# Patient Record
Sex: Female | Born: 1945 | Race: White | Hispanic: No | Smoking: Never smoker
Health system: Southern US, Community
[De-identification: ages and names within clinical notes are randomized; demographics above are authoritative.]

## PROBLEM LIST (undated history)

## (undated) DIAGNOSIS — R5382 Chronic fatigue, unspecified: Secondary | ICD-10-CM

## (undated) DIAGNOSIS — E119 Type 2 diabetes mellitus without complications: Secondary | ICD-10-CM

## (undated) DIAGNOSIS — J189 Pneumonia, unspecified organism: Secondary | ICD-10-CM

## (undated) DIAGNOSIS — M797 Fibromyalgia: Secondary | ICD-10-CM

## (undated) DIAGNOSIS — K219 Gastro-esophageal reflux disease without esophagitis: Secondary | ICD-10-CM

## (undated) DIAGNOSIS — R112 Nausea with vomiting, unspecified: Secondary | ICD-10-CM

## (undated) DIAGNOSIS — E785 Hyperlipidemia, unspecified: Secondary | ICD-10-CM

## (undated) DIAGNOSIS — Z87442 Personal history of urinary calculi: Secondary | ICD-10-CM

## (undated) DIAGNOSIS — K146 Glossodynia: Secondary | ICD-10-CM

## (undated) DIAGNOSIS — F5105 Insomnia due to other mental disorder: Secondary | ICD-10-CM

## (undated) DIAGNOSIS — R197 Diarrhea, unspecified: Secondary | ICD-10-CM

## (undated) DIAGNOSIS — K5900 Constipation, unspecified: Secondary | ICD-10-CM

## (undated) DIAGNOSIS — Z9889 Other specified postprocedural states: Secondary | ICD-10-CM

## (undated) DIAGNOSIS — M519 Unspecified thoracic, thoracolumbar and lumbosacral intervertebral disc disorder: Secondary | ICD-10-CM

## (undated) DIAGNOSIS — R1900 Intra-abdominal and pelvic swelling, mass and lump, unspecified site: Secondary | ICD-10-CM

## (undated) DIAGNOSIS — C569 Malignant neoplasm of unspecified ovary: Secondary | ICD-10-CM

## (undated) DIAGNOSIS — R0602 Shortness of breath: Secondary | ICD-10-CM

## (undated) DIAGNOSIS — F418 Other specified anxiety disorders: Secondary | ICD-10-CM

## (undated) DIAGNOSIS — I10 Essential (primary) hypertension: Secondary | ICD-10-CM

## (undated) DIAGNOSIS — B379 Candidiasis, unspecified: Secondary | ICD-10-CM

## (undated) DIAGNOSIS — G4730 Sleep apnea, unspecified: Secondary | ICD-10-CM

## (undated) HISTORY — PX: HERNIA REPAIR: SHX51

## (undated) HISTORY — DX: Unspecified thoracic, thoracolumbar and lumbosacral intervertebral disc disorder: M51.9

## (undated) HISTORY — DX: Sleep apnea, unspecified: G47.30

## (undated) HISTORY — DX: Chronic fatigue, unspecified: R53.82

## (undated) HISTORY — PX: ABDOMINAL HYSTERECTOMY: SHX81

## (undated) HISTORY — DX: Intra-abdominal and pelvic swelling, mass and lump, unspecified site: R19.00

## (undated) HISTORY — DX: Diarrhea, unspecified: R19.7

## (undated) HISTORY — DX: Malignant neoplasm of unspecified ovary: C56.9

## (undated) HISTORY — PX: TRIGGER FINGER RELEASE: SHX641

## (undated) HISTORY — DX: Constipation, unspecified: K59.00

## (undated) HISTORY — DX: Hyperlipidemia, unspecified: E78.5

## (undated) HISTORY — PX: CHOLECYSTECTOMY: SHX55

## (undated) HISTORY — DX: Glossodynia: K14.6

## (undated) HISTORY — DX: Fibromyalgia: M79.7

## (undated) HISTORY — DX: Type 2 diabetes mellitus without complications: E11.9

## (undated) HISTORY — PX: APPENDECTOMY: SHX54

## (undated) HISTORY — PX: OTHER SURGICAL HISTORY: SHX169

---

## 1998-06-17 ENCOUNTER — Ambulatory Visit (HOSPITAL_COMMUNITY): Admission: RE | Admit: 1998-06-17 | Discharge: 1998-06-17 | Payer: Self-pay | Admitting: Obstetrics and Gynecology

## 1998-06-17 ENCOUNTER — Encounter: Payer: Self-pay | Admitting: Obstetrics and Gynecology

## 1999-09-03 ENCOUNTER — Ambulatory Visit (HOSPITAL_COMMUNITY): Admission: RE | Admit: 1999-09-03 | Discharge: 1999-09-03 | Payer: Self-pay | Admitting: Family Medicine

## 1999-09-05 ENCOUNTER — Ambulatory Visit (HOSPITAL_COMMUNITY): Admission: RE | Admit: 1999-09-05 | Discharge: 1999-09-05 | Payer: Self-pay | Admitting: Family Medicine

## 1999-09-05 ENCOUNTER — Encounter: Payer: Self-pay | Admitting: Family Medicine

## 2002-05-25 ENCOUNTER — Encounter: Payer: Self-pay | Admitting: Family Medicine

## 2002-05-25 ENCOUNTER — Ambulatory Visit (HOSPITAL_COMMUNITY): Admission: RE | Admit: 2002-05-25 | Discharge: 2002-05-25 | Payer: Self-pay | Admitting: Family Medicine

## 2002-08-06 ENCOUNTER — Encounter: Admission: RE | Admit: 2002-08-06 | Discharge: 2002-08-06 | Payer: Self-pay | Admitting: Family Medicine

## 2002-08-06 ENCOUNTER — Encounter: Payer: Self-pay | Admitting: Family Medicine

## 2003-07-10 ENCOUNTER — Encounter: Admission: RE | Admit: 2003-07-10 | Discharge: 2003-10-08 | Payer: Self-pay | Admitting: Family Medicine

## 2004-08-10 HISTORY — PX: PARTIAL HYSTERECTOMY: SHX80

## 2004-09-05 ENCOUNTER — Ambulatory Visit (HOSPITAL_BASED_OUTPATIENT_CLINIC_OR_DEPARTMENT_OTHER): Admission: RE | Admit: 2004-09-05 | Discharge: 2004-09-05 | Payer: Self-pay | Admitting: Orthopedic Surgery

## 2009-03-29 ENCOUNTER — Emergency Department (HOSPITAL_COMMUNITY): Admission: EM | Admit: 2009-03-29 | Discharge: 2009-03-29 | Payer: Self-pay | Admitting: Emergency Medicine

## 2009-05-16 ENCOUNTER — Encounter: Admission: RE | Admit: 2009-05-16 | Discharge: 2009-05-16 | Payer: Self-pay | Admitting: Family Medicine

## 2010-02-24 ENCOUNTER — Emergency Department (HOSPITAL_COMMUNITY): Admission: EM | Admit: 2010-02-24 | Discharge: 2010-02-24 | Payer: Self-pay | Admitting: Emergency Medicine

## 2010-10-25 LAB — POCT CARDIAC MARKERS
CKMB, poc: <1 ng/mL — ABNORMAL LOW (ref 1.0–8.0)
Myoglobin, poc: 39.3 ng/mL (ref 12–200)
Troponin i, poc: <0.05 ng/mL (ref 0.00–0.09)

## 2010-12-26 NOTE — Op Note (Signed)
NAME:  Kristin Webb, Kristin Webb               ACCOUNT NO.:  [id number]   MEDICAL RECORD NO.:  [id number]          PATIENT TYPE:  AMB   LOCATION:  DSC                          FACILITY:  [facility]   PHYSICIAN:  Katy Fitch. Sypher Montez Hageman., M.D.DATE OF BIRTH:  29-Apr-1946   DATE OF PROCEDURE:  09/05/2004  DATE OF DISCHARGE:                                 OPERATIVE REPORT   PREOPERATIVE DIAGNOSIS:  Chronic stenosing tenosynovitis, right long, ring  and small fingers, and left thumb.   POSTOPERATIVE DIAGNOSIS:  Chronic stenosing tenosynovitis, right long, ring  and small fingers, and left thumb.   OPERATION:  1.  Release of right long finger A1 pulley.  2.  Release of the right ring finger A1 pulley.  3.  Release of right small finger A1 pulley.  4.  Release of left thumb A1 pulley.   OPERATING SURGEON:  Katy Fitch. Sypher, M.D.   ASSISTANT:  Jonni Sanger, P.A.   ANESTHESIA:  General by LMA   SUPERVISING ANESTHESIOLOGIST:  Janetta Hora. Gelene Mink, M.D.   INDICATIONS:  Kristin Webb is a 65 year old woman referred by Dr. Chales Salmon.  Thacker for evaluation and management of painful stenosing tenosynovitis.   Clinical examination revealed multiple sites of stenosing tenosynovitis in  both hands.   She was initially treated with steroid injection with only transient relief.   Due to failed respond to nonoperative measures, she is brought to operating  this time to relieve locked trigger fingers involving her right long, right  ring and right small fingers as well as her left thumb.   Preoperatively, we sent her for panel of lab studies, looking for  inflammation and signs of an inflammatory arthritis. Her lab studies were  unrevealing.   After informed consent, during which questions were invited and answered,  she is brought the operating room at this time.   PROCEDURE:  Kristin Webb was brought to the operating room and placed in  supine position upon the operating table.   Following the  induction of general anesthesia by LMA, the right and left  arms were prepped with Betadine soaping solution and sterilely draped. A  pneumatic tourniquet was applied to the proximal brachium.   On exsanguination of the limb with an Esmarch bandage, an arterial  tourniquet on the proximal brachium was inflated to 230 mmHg on the right  side.   Procedure commenced with an incision directly over the A1 pulley of the long  finger. Subcutaneous tissue were carefully divided, taking care to gently  retract the neurovascular structures. The A1 pulley was isolated and split  with a scalpel and scissors. The tendon was delivered and found be swollen.  The wound was then repaired with a mattress suture of 5-0 nylon.   A second incision was fashioned in the distal palmar crease between the A1  pulleys of the ring and small fingers.   With a gentle dissection, the palmar fascia was spread, the A1 pulleys of  the ring and small fingers identified and subsequently split with scalpel  and scissors. The tendons were delivered and found to be swollen;  thereafter, the triggering phenomenon was relieved.   This wound was then repaired with mattress suture of 5-0 nylon.   A compressive dressing was applied to the right hand with Xeroflo, sterile  gauze and an Ace wrap. The tourniquet released with immediate capillary  refill to all fingers and the thumb.   Attention was then directed to the left hand.   The left hand was exsanguinated with a Esmarch bandage and arterial  tourniquet inflated on the proximal forearm to 240 mmHg. Procedure commenced  with a short transverse incision directly over the palpably thickened A1  pulley of thumb. The subcutaneous tissues were carefully divided, revealing  the pulley. The radial proper digital nerve was gently retracted. The pulley  was split in its midline and released with scissors.   This relieved the compression on the flexor pollicis longus and  allowing  recovery of full motion of the left thumb IP joint.   This wound was repaired with a mattress suture of 5-0 nylon, followed by  dressing with Xeroflo, sterile gauze and Ace wrap.   Kristin Webb was then awakened from anesthesia and transferred to recovery  room with stable signs..   There were no apparent complications.      RVS/MEDQ  D:  09/05/2004  T:  09/05/2004  Job:  [id number]

## 2012-01-14 ENCOUNTER — Other Ambulatory Visit: Payer: Self-pay | Admitting: Family Medicine

## 2012-01-14 DIAGNOSIS — Z1231 Encounter for screening mammogram for malignant neoplasm of breast: Secondary | ICD-10-CM

## 2012-02-16 ENCOUNTER — Ambulatory Visit
Admission: RE | Admit: 2012-02-16 | Discharge: 2012-02-16 | Disposition: A | Discharge status: Home / Self Care | Payer: PRIVATE HEALTH INSURANCE | Source: Ambulatory Visit | Attending: Family Medicine | Admitting: Family Medicine

## 2012-02-16 DIAGNOSIS — Z1231 Encounter for screening mammogram for malignant neoplasm of breast: Secondary | ICD-10-CM

## 2012-08-10 DIAGNOSIS — C569 Malignant neoplasm of unspecified ovary: Secondary | ICD-10-CM

## 2012-08-10 HISTORY — DX: Malignant neoplasm of unspecified ovary: C56.9

## 2012-10-26 ENCOUNTER — Other Ambulatory Visit: Payer: Self-pay | Admitting: Family Medicine

## 2012-10-26 DIAGNOSIS — R143 Flatulence: Secondary | ICD-10-CM

## 2012-10-26 DIAGNOSIS — R142 Eructation: Secondary | ICD-10-CM

## 2012-11-01 ENCOUNTER — Ambulatory Visit
Admission: RE | Admit: 2012-11-01 | Discharge: 2012-11-01 | Disposition: A | Discharge status: Home / Self Care | Payer: Medicare Other | Source: Ambulatory Visit | Attending: Family Medicine | Admitting: Family Medicine

## 2012-11-01 DIAGNOSIS — R143 Flatulence: Secondary | ICD-10-CM

## 2012-11-01 MED ORDER — IOHEXOL 300 MG/ML  SOLN
100.0000 mL | Freq: Once | INTRAMUSCULAR | Status: AC | PRN
  Administered 2012-11-01: 100 mL via INTRAVENOUS

## 2012-11-02 ENCOUNTER — Encounter: Payer: Self-pay | Admitting: Gynecologic Oncology

## 2012-11-02 ENCOUNTER — Ambulatory Visit: Payer: Medicare Other | Attending: Gynecologic Oncology | Admitting: Gynecologic Oncology

## 2012-11-02 VITALS — BP 160/90 | HR 88 | Temp 98.4°F | Resp 24 | Ht 60.0 in | Wt 188.3 lb

## 2012-11-02 DIAGNOSIS — D3000 Benign neoplasm of unspecified kidney: Secondary | ICD-10-CM | POA: Insufficient documentation

## 2012-11-02 DIAGNOSIS — R109 Unspecified abdominal pain: Secondary | ICD-10-CM | POA: Insufficient documentation

## 2012-11-02 DIAGNOSIS — R1909 Other intra-abdominal and pelvic swelling, mass and lump: Secondary | ICD-10-CM | POA: Insufficient documentation

## 2012-11-02 DIAGNOSIS — K5900 Constipation, unspecified: Secondary | ICD-10-CM | POA: Insufficient documentation

## 2012-11-02 DIAGNOSIS — R1900 Intra-abdominal and pelvic swelling, mass and lump, unspecified site: Secondary | ICD-10-CM

## 2012-11-02 DIAGNOSIS — L539 Erythematous condition, unspecified: Secondary | ICD-10-CM | POA: Insufficient documentation

## 2012-11-02 DIAGNOSIS — R197 Diarrhea, unspecified: Secondary | ICD-10-CM | POA: Insufficient documentation

## 2012-11-02 DIAGNOSIS — N200 Calculus of kidney: Secondary | ICD-10-CM | POA: Insufficient documentation

## 2012-11-02 DIAGNOSIS — N83209 Unspecified ovarian cyst, unspecified side: Secondary | ICD-10-CM

## 2012-11-02 NOTE — Patient Instructions (Signed)
Ovarian Tumors The ovaries are small organs that produce eggs in women. They lie on each side of the uterus. Tumors are solid growths on the ovary, not like ovarian cysts that are filled with fluid. They can be cancerous or noncancerous. All solid tumors should be looked at to make sure they are not cancer tumors.  CAUSES  There are no known causes for developing a solid tumor on the ovary. However, there are several risk factors for developing cancerous tumors on the ovary, such as:  Aging.  [place] or [place] European descent.  Personal or family history of ovarian, colon and breast cancer.  Women with BRCA 1 or BRCA 2 genes are at high risk for getting ovarian cancer.  The use of fertility medications to get pregnant may increase the risk for getting ovarian cancer.  Late menopause (after age 63).  Women who become pregnant for the first time at 45 or older. Having these risk factors does not mean you will get ovarian cancer. However, you should know about them and report any that you have to your caregiver. Also, a woman with none of these risk factors can still get ovarian cancer. SYMPTOMS  In many cases there are no symptoms. Noncancerous tumors usually have no symptoms but cancerous tumors may have symptoms that are minor and resemble other health problems. The following are symptoms that may be important to diagnosing cancer of the ovary:  Unexplained weight loss.  Increase abdominal size.  Pain in the belly (abdomen).  Pain or pressure in the back and pelvis.  Tiredness.  Abnormal vaginal bleeding.  Loss of appetite.  Frequent urination or pressure on your bladder.  Indigestion, increase gas and bloating.  Painful sexual intercourse. DIAGNOSIS   During an exam, an abnormal mass may be found in the pelvis. It is important to have a rectovaginal examination to help find pelvic masses, especially in women over 63 years old.  An ultrasound may be done.  X-ray,  CT scan or MRI imaging.  Blood tests.  A Pap test does not help in diagnosing tumors or cancer of the ovary. New screening tests are always being studied to detect early ovarian cancer. TREATMENT   All solid tumors of the ovary should be evaluated, usually with surgery, to make sure they are not cancerous.  The tumor will be studied in the lab under the microscope to see if it is cancer.  Noncancerous tumors can be removed surgically with or without removing the ovary.  Cancerous tumors usually are removed with the ovary and sometimes both ovaries are removed with the Fallopian tubes, uterus and surrounding lymph nodes to see if the cancer has spread.  Cancerous tumors may also be treated along with the surgery with radiation, chemotherapy or both.  The surgeon should be a gynecology oncologist (cancer specialist in gynecology cancer surgery) and the chemotherapist and radiation therapist should be experienced specialists in their field. HOME CARE INSTRUCTIONS   Inform your caregiver if you or anyone in your family has had cancer.  Follow your caregiver's advice and recommendations regarding medications and follow up care.  Get a yearly physical and gynecology exams. This includes a rectovaginal exam if you are 53 years old or older. SEEK MEDICAL CARE IF:   You have any of the above symptoms that have not gone away after a week of treatment.  You are losing weight for no reason.  You feel generally ill. Document Released: 05/05/2008 Document Revised: 10/19/2011 Document Reviewed: 05/05/2008 [employer]  Patient Information 2013 [employer].  

## 2012-11-02 NOTE — Progress Notes (Signed)
Consult Note: Gyn-Onc  Carlethia Mesquita 67 y.o. female  CC:  Chief Complaint  Patient presents with  . Pelvic Mass    New patient    HPI: Patient is seen today in consultation at the request of Dr. Corliss Blacker.  Patient is a 67 year old gravida 2 para 2 who at about the third week of January started noticing some increasing swelling in her abdomen. Initially the pain was in swelling is somewhat intermittent and it would come and go and she felt was related to starting calcium supplementation as she her that that could cause symptoms. In February the pain became much more constant. In addition, it has been fairly normal for her to have 5-6 bowel movements a day and diarrhea which has been chronic after her cholecystectomy. She usually has a bowel movement about 15 minutes after eating. This changed as well. She started having some increasing constipation and her stools are more formed. She started to feel that she was not emptying her stools.   She had a CT scan of the abdomen and pelvis on March 25. It revealed no significant biliary dilation. There were no suspicious liver lesions. The spleen, adrenal glands and pancreas appeared normal. There is 1.2 cm calculus in the interpolar region of the left kidney. 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 She has a large midabdominal mass measuring 17 x 22.7 cm x 20 cm cephalad. It is well circumscribed without calcifications. There is irregular thickened septations and areas of solid nodularity consistent with malignancy. There is no bowel obstruction. Appears to be separate from the appendix. Probable a normal left ovarian tissue seen. The uterus is surgically absent. There is some soft tissue stranding at the base of the mesentery superior to the mass. There is also some omental nodularity. It is for this that she is referred to see [place] today. She did have a CA 125 drawn the results which are not available to me at this time. She does not remember  when her last mammogram was. She had a normal screening colonoscopy in July 2013.  Review of Systems: Much of the review of systems is as above. She does have slight shortness of breath but is no worse and has been. She denies any chest pain. She can climb a flight of stairs without difficulty. She's noticed no issues with her eating. She does not have any early satiety. She's noticed no decrease in her bloating or discomfort related to either eating, not eating, or having a bowel movement. She denies any nausea vomiting.  Remainder of 10 point review of systems is negative.  Current Meds:  Outpatient Encounter Prescriptions as of 11/02/2012  Medication Sig Dispense Refill  . Armodafinil (NUVIGIL) 250 MG tablet Take 250 mg by mouth daily.      Marland Kitchen BIOTIN PO Take by mouth daily.      . Calcium Citrate-Vitamin D (CALCIUM CITRATE + D PO) Take by mouth daily.      . Cholecalciferol (VITAMIN D3) 5000 UNITS CAPS Take by mouth daily.      . Eszopiclone (ESZOPICLONE) 3 MG TABS Take 3 mg by mouth at bedtime. Take immediately before bedtime      . fish oil-omega-3 fatty acids 1000 MG capsule Take 1 g by mouth 2 (two) times daily.      Marland Kitchen FLUoxetine (PROZAC) 10 MG tablet Take 30 mg by mouth daily.      Marland Kitchen glucosamine-chondroitin 500-400 MG tablet Take 1 tablet by mouth 2 (two)  times daily.      Marland Kitchen MELATONIN PO Take by mouth at bedtime.      . Rosuvastatin Calcium (CRESTOR PO) Take by mouth at bedtime.       No facility-administered encounter medications on file as of 11/02/2012.    Allergy:  Allergies  Allergen Reactions  . Bee Venom Hives    Difficulty breathing, carries an EPI-pen  . Cortisone     Injected cortisone, "sick to my stomach, throwing up, hand swelling, and pain."    Social Hx:   History   Social History  . Marital Status: Single    Spouse Name: N/A    Number of Children: N/A  . Years of Education: N/A   Occupational History  . Not on file.   Social History Main Topics  .  Smoking status: Never Smoker   . Smokeless tobacco: Not on file  . Alcohol Use: No  . Drug Use: No  . Sexually Active: Not on file   Other Topics Concern  . Not on file   Social History Narrative  . No narrative on file    Past Surgical Hx:  Past Surgical History  Procedure Laterality Date  . Cholecystectomy      early 11s  . Abdominal hysterectomy      early 1990s  . Trigger finger release      Past Medical Hx:  Past Medical History  Diagnosis Date  . Pelvic mass in female   . Constipation   . Diarrhea     in the past after gallbladder removal  . Sleep apnea     CPAP  . Hyperlipidemia   . Burning mouth syndrome   . Chronic fatigue   . Fibromyalgia   . Lumbar disc disease     Family Hx:  Family History  Problem Relation Age of Onset  . Lung cancer Mother   . Lung cancer Father     Vitals:  Blood pressure 160/90, pulse 88, temperature 98.4 F (36.9 C), resp. rate 24, height 5' (1.524 m), weight 188 lb 4.8 oz (85.412 kg).  Physical Exam:  Well-nourished well-developed female in no acute distress.  Neck: Supple, no lymphadenopathy, no thyromegaly.  Lungs: Clear to auscultation bilaterally.  Cardiovascular: Regular rate and rhythm.  Abdomen: Massively enlarged with a pelvic mass that extends almost to the xiphoid. It is nontender. There is no appreciable fluid wave. Exam is somewhat limited by habitus.  Groins: No lymphadenopathy.  Extremities: No edema.  Pelvic: External genitalia notable for erythema consistent with Candida. Bimanual examination reveals a mass to be high out of the pelvis. On pelvic examination there is no nodularity. Rectal confirms.  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 I do not have her CA 125 available. However based on size and symptoms she needs to undergo surgical removal of this. She is tentatively scheduled for surgery on April 1. We'll proceed with exploratory  laparotomy and removal of this mass. The mass be sent for frozen section. We'll remove the contralateral ovary as well. Should the mass returned as benign we'll evaluate the omentum to ensure the other nodule areas are not of any significance. Should it be a malignancy she'll undergo omentectomy and appropriate staging.  Risks of surgery including but not limited to bleeding, infection, thromboembolic disease were discussed with the patient. She understands that she'll have SCD hose on during the surgery receive Lovenox injections while in house. Should this be a malignancy she'll undergo Lovenox  injections when she goes home. She was instructed to bring her CPAP machine to the hospital on the day of surgery.  Should go to the preoperative screening process for the hospital.  Her questions were elicited in answer to her satisfaction. She has to negotiate the surgical date with her family due to family conflicts.  She is currently posted for 11/08/12 and she may choose to change to the following week.  Cleda Mccreedy A., MD 11/02/2012, 12:39 PM

## 2012-11-03 ENCOUNTER — Encounter (HOSPITAL_COMMUNITY): Payer: Self-pay | Admitting: Pharmacy Technician

## 2012-11-03 NOTE — Patient Instructions (Signed)
Kristin Webb  11/03/2012   Your procedure is scheduled on:  11/08/12   Report to [facility] at           AM.  Call this number if you have problems the morning of surgery: [phone]   Remember:   Do not eat food or drink liquids after midnight.   Take these medicines the morning of surgery with A SIP OF WATER:    Do not wear jewelry, make-up or nail polish.  Do not wear lotions, powders, or perfumes.   Do not shave 48 hours prior to surgery.   Do not bring valuables to the hospital.  Contacts, dentures or bridgework may not be worn into surgery.  Leave suitcase in the car. After surgery it may be brought to your room.  For patients admitted to the hospital, checkout time is 11:00 AM the day of  discharge.      SEE CHG INSTRUCTION SHEET    Please read over the following fact sheets that you were given: MRSA Information, coughing and deep breathing exercises, leg exercises, Blood transfusion Fact sheet , Incentive Spirometry Fact Sheet                Failure to comply with these instructions may result in cancellation of your surgery.                Patient Signature ____________________________              Nurse Signature _____________________________

## 2012-11-04 ENCOUNTER — Encounter (HOSPITAL_COMMUNITY): Payer: Self-pay

## 2012-11-04 ENCOUNTER — Ambulatory Visit (HOSPITAL_COMMUNITY)
Admission: RE | Admit: 2012-11-04 | Discharge: 2012-11-04 | Disposition: A | Discharge status: Home / Self Care | Payer: Medicare Other | Source: Ambulatory Visit | Attending: Gynecologic Oncology | Admitting: Gynecologic Oncology

## 2012-11-04 ENCOUNTER — Encounter (HOSPITAL_COMMUNITY)
Admission: RE | Admit: 2012-11-04 | Discharge: 2012-11-04 | Disposition: A | Discharge status: Home / Self Care | Payer: Medicare Other | Source: Ambulatory Visit | Attending: Gynecologic Oncology | Admitting: Gynecologic Oncology

## 2012-11-04 DIAGNOSIS — Z01812 Encounter for preprocedural laboratory examination: Secondary | ICD-10-CM | POA: Insufficient documentation

## 2012-11-04 DIAGNOSIS — Z01810 Encounter for preprocedural cardiovascular examination: Secondary | ICD-10-CM | POA: Insufficient documentation

## 2012-11-04 DIAGNOSIS — R9431 Abnormal electrocardiogram [ECG] [EKG]: Secondary | ICD-10-CM | POA: Insufficient documentation

## 2012-11-04 DIAGNOSIS — Z01818 Encounter for other preprocedural examination: Secondary | ICD-10-CM | POA: Insufficient documentation

## 2012-11-04 HISTORY — DX: Essential (primary) hypertension: I10

## 2012-11-04 HISTORY — DX: Candidiasis, unspecified: B37.9

## 2012-11-04 HISTORY — DX: Gastro-esophageal reflux disease without esophagitis: K21.9

## 2012-11-04 HISTORY — DX: Other specified postprocedural states: Z98.890

## 2012-11-04 HISTORY — DX: Nausea with vomiting, unspecified: R11.2

## 2012-11-04 HISTORY — DX: Pneumonia, unspecified organism: J18.9

## 2012-11-04 HISTORY — DX: Shortness of breath: R06.02

## 2012-11-04 LAB — CBC WITH DIFFERENTIAL/PLATELET
Basophils Absolute: 0 10*3/uL (ref 0.0–0.1)
Basophils Relative: 1 % (ref 0–1)
Eosinophils Absolute: 0.1 10*3/uL (ref 0.0–0.7)
Eosinophils Relative: 2 % (ref 0–5)
HCT: 43.8 % (ref 36.0–46.0)
Hemoglobin: 14.3 g/dL (ref 12.0–15.0)
Lymphocytes Relative: 30 % (ref 12–46)
Lymphs Abs: 1.6 10*3/uL (ref 0.7–4.0)
MCH: 28.9 pg (ref 26.0–34.0)
MCHC: 32.6 g/dL (ref 30.0–36.0)
MCV: 88.7 fL (ref 78.0–100.0)
Monocytes Absolute: 0.6 10*3/uL (ref 0.1–1.0)
Monocytes Relative: 12 % (ref 3–12)
Neutro Abs: 3 10*3/uL (ref 1.7–7.7)
Neutrophils Relative %: 56 % (ref 43–77)
Platelets: 174 10*3/uL (ref 150–400)
RBC: 4.94 MIL/uL (ref 3.87–5.11)
RDW: 13.3 % (ref 11.5–15.5)
WBC: 5.3 10*3/uL (ref 4.0–10.5)

## 2012-11-04 LAB — SURGICAL PCR SCREEN
MRSA, PCR: NEGATIVE
Staphylococcus aureus: NEGATIVE

## 2012-11-04 LAB — COMPREHENSIVE METABOLIC PANEL
ALT: 17 U/L (ref 0–35)
AST: 16 U/L (ref 0–37)
Albumin: 3.7 g/dL (ref 3.5–5.2)
Alkaline Phosphatase: 87 U/L (ref 39–117)
BUN: 15 mg/dL (ref 6–23)
CO2: 28 mEq/L (ref 19–32)
Calcium: 9.5 mg/dL (ref 8.4–10.5)
Chloride: 103 mEq/L (ref 96–112)
Creatinine, Ser: 0.6 mg/dL (ref 0.50–1.10)
GFR calc Af Amer: >90 mL/min (ref >90)
GFR calc non Af Amer: >90 mL/min (ref >90)
Glucose, Bld: 91 mg/dL (ref 70–99)
Potassium: 4 mEq/L (ref 3.5–5.1)
Sodium: 140 mEq/L (ref 135–145)
Total Bilirubin: 0.2 mg/dL — ABNORMAL LOW (ref 0.3–1.2)
Total Protein: 7.2 g/dL (ref 6.0–8.3)

## 2012-11-04 LAB — ABO/RH: ABO/RH(D): O POS

## 2012-11-04 NOTE — Progress Notes (Signed)
Requested ( left message with Medical Records0 and requested last office visit note from approximately 8 months ago from Dr Porfirio Mylar Dohmeier.

## 2012-11-04 NOTE — Progress Notes (Signed)
Patient did not sign operative consent at time of preop appointment.  Patient stated that risks, goals, etc had not been discussed with her.  FYI.  Consent on front of chart.

## 2012-11-04 NOTE — Progress Notes (Signed)
Called patient at home to determine the powder used for yeast.  Patient stated Nystatin powder unsure of strength.  Called [facility] at [facility] and pharmacy stated it is Nystatin powder 100,000units /GM as needed.  Placed in home medications.

## 2012-11-04 NOTE — Progress Notes (Signed)
Attempted to request  last office visit note from Dr Richardean Chimera [phone] to be placed on chart.  Patient stated she was last seen approximately 8 months ago.

## 2012-11-07 NOTE — Progress Notes (Signed)
Last office visit note with Dr Richardean Chimera on chart from 9/13.

## 2012-11-07 NOTE — Progress Notes (Signed)
Requested from office of Dr Richardean Chimera ( left messsage) last office visit note to be faxed.

## 2012-11-08 ENCOUNTER — Inpatient Hospital Stay (HOSPITAL_COMMUNITY)
Admission: RE | Admit: 2012-11-08 | Discharge: 2012-11-12 | DRG: 737 | Disposition: A | Discharge status: Home / Self Care | Payer: Medicare Other | Source: Ambulatory Visit | Attending: Obstetrics & Gynecology | Admitting: Obstetrics & Gynecology

## 2012-11-08 ENCOUNTER — Encounter (HOSPITAL_COMMUNITY)
Admission: RE | Disposition: A | Discharge status: Home / Self Care | Payer: Self-pay | Source: Ambulatory Visit | Attending: Obstetrics & Gynecology

## 2012-11-08 ENCOUNTER — Encounter (HOSPITAL_COMMUNITY): Payer: Self-pay | Admitting: Anesthesiology

## 2012-11-08 ENCOUNTER — Inpatient Hospital Stay (HOSPITAL_COMMUNITY): Payer: Medicare Other | Admitting: Anesthesiology

## 2012-11-08 ENCOUNTER — Encounter (HOSPITAL_COMMUNITY): Payer: Self-pay | Admitting: *Deleted

## 2012-11-08 DIAGNOSIS — Z6836 Body mass index (BMI) 36.0-36.9, adult: Secondary | ICD-10-CM

## 2012-11-08 DIAGNOSIS — I10 Essential (primary) hypertension: Secondary | ICD-10-CM | POA: Diagnosis present

## 2012-11-08 DIAGNOSIS — B373 Candidiasis of vulva and vagina: Secondary | ICD-10-CM | POA: Diagnosis present

## 2012-11-08 DIAGNOSIS — B3731 Acute candidiasis of vulva and vagina: Secondary | ICD-10-CM | POA: Diagnosis present

## 2012-11-08 DIAGNOSIS — E785 Hyperlipidemia, unspecified: Secondary | ICD-10-CM | POA: Diagnosis present

## 2012-11-08 DIAGNOSIS — Z79899 Other long term (current) drug therapy: Secondary | ICD-10-CM

## 2012-11-08 DIAGNOSIS — K668 Other specified disorders of peritoneum: Secondary | ICD-10-CM | POA: Diagnosis present

## 2012-11-08 DIAGNOSIS — IMO0001 Reserved for inherently not codable concepts without codable children: Secondary | ICD-10-CM | POA: Diagnosis present

## 2012-11-08 DIAGNOSIS — E669 Obesity, unspecified: Secondary | ICD-10-CM | POA: Diagnosis present

## 2012-11-08 DIAGNOSIS — C5700 Malignant neoplasm of unspecified fallopian tube: Secondary | ICD-10-CM | POA: Diagnosis present

## 2012-11-08 DIAGNOSIS — C481 Malignant neoplasm of specified parts of peritoneum: Secondary | ICD-10-CM | POA: Diagnosis present

## 2012-11-08 DIAGNOSIS — Z9089 Acquired absence of other organs: Secondary | ICD-10-CM

## 2012-11-08 DIAGNOSIS — D3910 Neoplasm of uncertain behavior of unspecified ovary: Principal | ICD-10-CM | POA: Diagnosis present

## 2012-11-08 DIAGNOSIS — G4730 Sleep apnea, unspecified: Secondary | ICD-10-CM | POA: Diagnosis present

## 2012-11-08 DIAGNOSIS — Z90710 Acquired absence of both cervix and uterus: Secondary | ICD-10-CM

## 2012-11-08 DIAGNOSIS — C569 Malignant neoplasm of unspecified ovary: Secondary | ICD-10-CM

## 2012-11-08 DIAGNOSIS — N83209 Unspecified ovarian cyst, unspecified side: Secondary | ICD-10-CM

## 2012-11-08 HISTORY — PX: LAPAROTOMY: SHX154

## 2012-11-08 LAB — TYPE AND SCREEN
ABO/RH(D): O POS
Antibody Screen: NEGATIVE

## 2012-11-08 SURGERY — LAPAROTOMY, EXPLORATORY
Anesthesia: General | Site: Pelvis | Laterality: Bilateral | Wound class: Clean Contaminated

## 2012-11-08 MED ORDER — LACTATED RINGERS IV SOLN
INTRAVENOUS | Status: DC
  Administered 2012-11-08 (×4): via INTRAVENOUS

## 2012-11-08 MED ORDER — MEPERIDINE HCL 50 MG/ML IJ SOLN: 6.2500 mg | INTRAMUSCULAR | Status: DC | PRN

## 2012-11-08 MED ORDER — METOCLOPRAMIDE HCL 5 MG/ML IJ SOLN
INTRAMUSCULAR | Status: DC | PRN
  Administered 2012-11-08: 5 mg via INTRAVENOUS

## 2012-11-08 MED ORDER — PROPOFOL 10 MG/ML IV EMUL
INTRAVENOUS | Status: DC | PRN
  Administered 2012-11-08: 200 mg via INTRAVENOUS
  Administered 2012-11-08: 50 mg via INTRAVENOUS

## 2012-11-08 MED ORDER — NALOXONE HCL 0.4 MG/ML IJ SOLN: 0.4000 mg | INTRAMUSCULAR | Status: DC | PRN

## 2012-11-08 MED ORDER — KCL IN DEXTROSE-NACL 20-5-0.45 MEQ/L-%-% IV SOLN
INTRAVENOUS | Status: DC
  Administered 2012-11-08 – 2012-11-09 (×2): via INTRAVENOUS
  Filled 2012-11-08 (×3): qty 1000

## 2012-11-08 MED ORDER — MIDAZOLAM HCL 5 MG/5ML IJ SOLN
INTRAMUSCULAR | Status: DC | PRN
  Administered 2012-11-08 (×2): 1 mg via INTRAVENOUS

## 2012-11-08 MED ORDER — SODIUM CHLORIDE 0.9 % IJ SOLN: 9.0000 mL | INTRAMUSCULAR | Status: DC | PRN

## 2012-11-08 MED ORDER — HYDROMORPHONE HCL PF 1 MG/ML IJ SOLN
INTRAMUSCULAR | Status: DC | PRN
  Administered 2012-11-08: 1 mg via INTRAVENOUS

## 2012-11-08 MED ORDER — LIDOCAINE HCL (CARDIAC) 20 MG/ML IV SOLN
INTRAVENOUS | Status: DC | PRN
  Administered 2012-11-08: 100 mg via INTRAVENOUS

## 2012-11-08 MED ORDER — KETOROLAC TROMETHAMINE 30 MG/ML IJ SOLN
15.0000 mg | Freq: Four times a day (QID) | INTRAMUSCULAR | Status: DC
  Filled 2012-11-08 (×4): qty 1

## 2012-11-08 MED ORDER — HYDRALAZINE HCL 20 MG/ML IJ SOLN
INTRAMUSCULAR | Status: DC | PRN
  Administered 2012-11-08 (×2): 5 mg via INTRAVENOUS

## 2012-11-08 MED ORDER — ATORVASTATIN CALCIUM 10 MG PO TABS
10.0000 mg | ORAL_TABLET | Freq: Every day | ORAL | Status: DC
  Filled 2012-11-08 (×5): qty 1

## 2012-11-08 MED ORDER — GLYCOPYRROLATE 0.2 MG/ML IJ SOLN
INTRAMUSCULAR | Status: DC | PRN
  Administered 2012-11-08: .8 mg via INTRAVENOUS

## 2012-11-08 MED ORDER — SODIUM CHLORIDE 0.9 % IJ SOLN
INTRAMUSCULAR | Status: DC | PRN
  Administered 2012-11-08: 16:00:00

## 2012-11-08 MED ORDER — LABETALOL HCL 5 MG/ML IV SOLN
INTRAVENOUS | Status: DC | PRN
  Administered 2012-11-08 (×4): 5 mg via INTRAVENOUS

## 2012-11-08 MED ORDER — SODIUM CHLORIDE 0.9 % IV SOLN
INTRAVENOUS | Status: AC
  Filled 2012-11-08: qty 1

## 2012-11-08 MED ORDER — SUFENTANIL CITRATE 50 MCG/ML IV SOLN
INTRAVENOUS | Status: DC | PRN
  Administered 2012-11-08 (×3): 10 ug via INTRAVENOUS
  Administered 2012-11-08: 5 ug via INTRAVENOUS
  Administered 2012-11-08: 20 ug via INTRAVENOUS
  Administered 2012-11-08: 15 ug via INTRAVENOUS
  Administered 2012-11-08: 10 ug via INTRAVENOUS
  Administered 2012-11-08: 20 ug via INTRAVENOUS

## 2012-11-08 MED ORDER — ENOXAPARIN SODIUM 40 MG/0.4ML ~~LOC~~ SOLN
40.0000 mg | SUBCUTANEOUS | Status: AC
  Administered 2012-11-08: 40 mg via SUBCUTANEOUS
  Filled 2012-11-08: qty 0.4

## 2012-11-08 MED ORDER — OXYCODONE-ACETAMINOPHEN 5-325 MG PO TABS
1.0000 | ORAL_TABLET | ORAL | Status: DC | PRN
  Administered 2012-11-09: 1 via ORAL
  Administered 2012-11-09 (×2): 2 via ORAL
  Administered 2012-11-09: 1 via ORAL
  Administered 2012-11-10 – 2012-11-11 (×5): 2 via ORAL
  Filled 2012-11-08 (×2): qty 2
  Filled 2012-11-08: qty 1
  Filled 2012-11-08 (×2): qty 2
  Filled 2012-11-08: qty 1
  Filled 2012-11-08: qty 2
  Filled 2012-11-08: qty 1
  Filled 2012-11-08 (×2): qty 2

## 2012-11-08 MED ORDER — OXYCODONE HCL 5 MG PO TABS: 5.0000 mg | ORAL_TABLET | Freq: Once | ORAL | Status: DC | PRN

## 2012-11-08 MED ORDER — KETOROLAC TROMETHAMINE 30 MG/ML IJ SOLN
15.0000 mg | Freq: Four times a day (QID) | INTRAMUSCULAR | Status: DC
  Administered 2012-11-08 – 2012-11-10 (×7): 15 mg via INTRAVENOUS
  Filled 2012-11-08 (×14): qty 1

## 2012-11-08 MED ORDER — SODIUM CHLORIDE 0.9 % IV SOLN
1.0000 g | INTRAVENOUS | Status: DC | PRN
  Administered 2012-11-08: 1 g via INTRAVENOUS

## 2012-11-08 MED ORDER — DIPHENHYDRAMINE HCL 50 MG/ML IJ SOLN: 12.5000 mg | Freq: Four times a day (QID) | INTRAMUSCULAR | Status: DC | PRN

## 2012-11-08 MED ORDER — SCOPOLAMINE 1 MG/3DAYS TD PT72
1.0000 | MEDICATED_PATCH | TRANSDERMAL | Status: DC
  Administered 2012-11-08: 1 via TRANSDERMAL
  Administered 2012-11-08: 1.5 mg via TRANSDERMAL

## 2012-11-08 MED ORDER — CEFAZOLIN SODIUM-DEXTROSE 2-3 GM-% IV SOLR
2.0000 g | INTRAVENOUS | Status: AC
  Administered 2012-11-08: 2 g via INTRAVENOUS

## 2012-11-08 MED ORDER — MAGNESIUM HYDROXIDE 400 MG/5ML PO SUSP
30.0000 mL | Freq: Three times a day (TID) | ORAL | Status: AC
  Administered 2012-11-08 – 2012-11-09 (×3): 30 mL via ORAL
  Filled 2012-11-08 (×3): qty 30

## 2012-11-08 MED ORDER — OXYCODONE HCL 5 MG/5ML PO SOLN
5.0000 mg | Freq: Once | ORAL | Status: DC | PRN
  Filled 2012-11-08: qty 5

## 2012-11-08 MED ORDER — ONDANSETRON HCL 4 MG PO TABS
4.0000 mg | ORAL_TABLET | Freq: Four times a day (QID) | ORAL | Status: DC | PRN
  Administered 2012-11-10 – 2012-11-11 (×3): 4 mg via ORAL
  Filled 2012-11-08 (×3): qty 1

## 2012-11-08 MED ORDER — NEOSTIGMINE METHYLSULFATE 1 MG/ML IJ SOLN
INTRAMUSCULAR | Status: DC | PRN
  Administered 2012-11-08: 5 mg via INTRAVENOUS

## 2012-11-08 MED ORDER — ARMODAFINIL 250 MG PO TABS
250.0000 mg | ORAL_TABLET | Freq: Every morning | ORAL | Status: DC
  Administered 2012-11-10 – 2012-11-11 (×2): 250 mg via ORAL

## 2012-11-08 MED ORDER — ENOXAPARIN SODIUM 40 MG/0.4ML ~~LOC~~ SOLN
40.0000 mg | SUBCUTANEOUS | Status: DC
  Administered 2012-11-09 – 2012-11-12 (×4): 40 mg via SUBCUTANEOUS
  Filled 2012-11-08 (×5): qty 0.4

## 2012-11-08 MED ORDER — 0.9 % SODIUM CHLORIDE (POUR BTL) OPTIME
TOPICAL | Status: DC | PRN
  Administered 2012-11-08 (×2): 1000 mL

## 2012-11-08 MED ORDER — HYDROMORPHONE 0.3 MG/ML IV SOLN
INTRAVENOUS | Status: DC
  Administered 2012-11-08: 17:00:00 via INTRAVENOUS
  Administered 2012-11-08: 1.79 mg via INTRAVENOUS
  Administered 2012-11-09: 0.2 mg via INTRAVENOUS
  Administered 2012-11-09: 1.59 mg via INTRAVENOUS
  Administered 2012-11-09: 0.7999 mg via INTRAVENOUS

## 2012-11-08 MED ORDER — SUCCINYLCHOLINE CHLORIDE 20 MG/ML IJ SOLN
INTRAMUSCULAR | Status: DC | PRN
  Administered 2012-11-08: 100 mg via INTRAVENOUS

## 2012-11-08 MED ORDER — DIPHENHYDRAMINE HCL 12.5 MG/5ML PO ELIX: 12.5000 mg | ORAL_SOLUTION | Freq: Four times a day (QID) | ORAL | Status: DC | PRN

## 2012-11-08 MED ORDER — CISATRACURIUM BESYLATE (PF) 10 MG/5ML IV SOLN
INTRAVENOUS | Status: DC | PRN
  Administered 2012-11-08: 6 mg via INTRAVENOUS
  Administered 2012-11-08 (×2): 2 mg via INTRAVENOUS

## 2012-11-08 MED ORDER — PROMETHAZINE HCL 25 MG/ML IJ SOLN: 6.2500 mg | INTRAMUSCULAR | Status: DC | PRN

## 2012-11-08 MED ORDER — ZOLPIDEM TARTRATE 5 MG PO TABS: 5.0000 mg | ORAL_TABLET | Freq: Every evening | ORAL | Status: DC | PRN

## 2012-11-08 MED ORDER — ONDANSETRON HCL 4 MG/2ML IJ SOLN: 4.0000 mg | Freq: Four times a day (QID) | INTRAMUSCULAR | Status: DC | PRN

## 2012-11-08 MED ORDER — ACETAMINOPHEN 10 MG/ML IV SOLN
INTRAVENOUS | Status: DC | PRN
  Administered 2012-11-08: 1000 mg via INTRAVENOUS

## 2012-11-08 MED ORDER — BUPIVACAINE LIPOSOME 1.3 % IJ SUSP
20.0000 mL | Freq: Once | INTRAMUSCULAR | Status: DC
  Filled 2012-11-08: qty 20

## 2012-11-08 MED ORDER — HYDROMORPHONE HCL PF 1 MG/ML IJ SOLN: 0.2500 mg | INTRAMUSCULAR | Status: DC | PRN

## 2012-11-08 MED ORDER — ONDANSETRON HCL 4 MG/2ML IJ SOLN
INTRAMUSCULAR | Status: DC | PRN
  Administered 2012-11-08 (×2): 2 mg via INTRAVENOUS

## 2012-11-08 MED ORDER — HYDROMORPHONE 0.3 MG/ML IV SOLN
INTRAVENOUS | Status: AC
  Filled 2012-11-08: qty 25

## 2012-11-08 MED ORDER — ACETAMINOPHEN 10 MG/ML IV SOLN: 1000.0000 mg | Freq: Once | INTRAVENOUS | Status: DC | PRN

## 2012-11-08 SURGICAL SUPPLY — 38 items
ATTRACTOMAT 16X20 MAGNETIC DRP (DRAPES) ×2 IMPLANT
BAG URINE DRAINAGE (UROLOGICAL SUPPLIES) ×2 IMPLANT
BLADE EXTENDED COATED 6.5IN (ELECTRODE) ×2 IMPLANT
CANISTER SUCTION 2500CC (MISCELLANEOUS) ×2 IMPLANT
CHLORAPREP W/TINT 26ML (MISCELLANEOUS) ×4 IMPLANT
CLIP TI MEDIUM LARGE 6 (CLIP) IMPLANT
CLOTH BEACON ORANGE TIMEOUT ST (SAFETY) ×2 IMPLANT
COVER SURGICAL LIGHT HANDLE (MISCELLANEOUS) ×2 IMPLANT
DRAPE INCISE 23X17 IOBAN STRL (DRAPES) ×1
DRAPE INCISE IOBAN 23X17 STRL (DRAPES) ×1 IMPLANT
DRAPE TABLE BACK 44X90 PK DISP (DRAPES) ×2 IMPLANT
DRAPE WARM FLUID 44X44 (DRAPE) ×2 IMPLANT
DRSG TELFA 4X10 ISLAND STR (GAUZE/BANDAGES/DRESSINGS) ×2 IMPLANT
ELECT REM PT RETURN 9FT ADLT (ELECTROSURGICAL) ×4
ELECTRODE REM PT RTRN 9FT ADLT (ELECTROSURGICAL) ×2 IMPLANT
GAUZE SPONGE 4X4 16PLY XRAY LF (GAUZE/BANDAGES/DRESSINGS) ×2 IMPLANT
GLOVE BIO SURGEON STRL SZ 6.5 (GLOVE) ×2 IMPLANT
GLOVE BIO SURGEON STRL SZ7.5 (GLOVE) ×4 IMPLANT
GLOVE BIOGEL PI IND STRL 7.0 (GLOVE) ×2 IMPLANT
GLOVE BIOGEL PI INDICATOR 7.0 (GLOVE) ×2
GOWN STRL NON-REIN LRG LVL3 (GOWN DISPOSABLE) ×2 IMPLANT
HOLDER FOLEY CATH W/STRAP (MISCELLANEOUS) ×2 IMPLANT
LIGASURE IMPACT 36 18CM CVD LR (INSTRUMENTS) ×2 IMPLANT
NEEDLE HYPO 25X1 1.5 SAFETY (NEEDLE) ×2 IMPLANT
NS IRRIG 1000ML POUR BTL (IV SOLUTION) ×8 IMPLANT
PACK ABDOMINAL WL (CUSTOM PROCEDURE TRAY) ×2 IMPLANT
SHEET LAVH (DRAPES) ×2 IMPLANT
SPONGE LAP 18X18 X RAY DECT (DISPOSABLE) ×6 IMPLANT
SPONGE SURGIFOAM ABS GEL 100 (HEMOSTASIS) ×2 IMPLANT
STAPLER VISISTAT 35W (STAPLE) ×2 IMPLANT
SUT PDS AB 1 CTXB1 36 (SUTURE) ×4 IMPLANT
SUT VIC AB 0 CT1 36 (SUTURE) ×16 IMPLANT
SUT VIC AB 2-0 CT2 27 (SUTURE) IMPLANT
SUT VICRYL 2 0 18  UND BR (SUTURE) ×1
SUT VICRYL 2 0 18 UND BR (SUTURE) ×1 IMPLANT
SYR CONTROL 10ML LL (SYRINGE) ×2 IMPLANT
TOWEL OR 17X26 10 PK STRL BLUE (TOWEL DISPOSABLE) ×2 IMPLANT
TRAY FOLEY CATH 14FRSI W/METER (CATHETERS) ×2 IMPLANT

## 2012-11-08 NOTE — Anesthesia Postprocedure Evaluation (Signed)
Anesthesia Post Note  Patient: Lenette Rau  Procedure(s) Performed: Procedure(s) (LRB): EXPLORATORY LAPAROTOMY TOTAL ABDOMINAL HYSTERECTOMY BILATERAL SALPINGO OOPHORECTOMY TUMOR DEBULKING  (Bilateral)  Anesthesia type: General  Patient location: PACU  Post pain: Pain level controlled  Post assessment: Post-op Vital signs reviewed  Last Vitals: BP 133/67  Pulse 69  Temp(Src) 36.9 C (Oral)  Resp 10  SpO2 94%  Post vital signs: Reviewed  Level of consciousness: sedated  Complications: No apparent anesthesia complications

## 2012-11-08 NOTE — Anesthesia Preprocedure Evaluation (Addendum)
Anesthesia Evaluation  Patient identified by MRN, date of birth, ID band Patient awake    Reviewed: Allergy & Precautions, H&P , NPO status , Patient's Chart, lab work & pertinent test results  History of Anesthesia Complications (+) PONV  Airway Mallampati: II TM Distance: >3 FB Neck ROM: Full    Dental  (+) Dental Advisory Given   Pulmonary shortness of breath, sleep apnea , pneumonia -,  breath sounds clear to auscultation        Cardiovascular hypertension, Pt. on medications Rhythm:Regular     Neuro/Psych PSYCHIATRIC DISORDERS negative neurological ROS  negative psych ROS   GI/Hepatic Neg liver ROS, GERD-  Medicated,  Endo/Other  negative endocrine ROS  Renal/GU negative Renal ROS     Musculoskeletal negative musculoskeletal ROS (+) Fibromyalgia -  Abdominal   Peds  Hematology negative hematology ROS (+)   Anesthesia Other Findings   Reproductive/Obstetrics                          Anesthesia Physical Anesthesia Plan  ASA: III  Anesthesia Plan: General   Post-op Pain Management:    Induction: Intravenous  Airway Management Planned: Oral ETT  Additional Equipment:   Intra-op Plan:   Post-operative Plan: Extubation in OR  Informed Consent: I have reviewed the patients History and Physical, chart, labs and discussed the procedure including the risks, benefits and alternatives for the proposed anesthesia with the patient or authorized representative who has indicated his/her understanding and acceptance.   Dental advisory given  Plan Discussed with: CRNA  Anesthesia Plan Comments:         Anesthesia Quick Evaluation

## 2012-11-08 NOTE — Op Note (Signed)
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 Duard Brady, MD; Antionette Char, MD   Assistant: Telford Nab   Anesthesia: General   Estimated blood loss: 100 ml   IVF: 3000 ml   Urine output: 300 ml   Complications: None   Pathology: Bilateral fallopian tubes and ovaries to pathology. Appendix as well as omentum. 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  Operative findings: 25 cm right adnexal mass with smooth surface. Surgically absent uterus. Atrophic-appearing left ovary. Normal appearing appendix. Within the omentum there were centimeter nodules scattered throughout the omentum. The remainder of the surfaces were benign.  Procedure: The patient was identified in the preoperative holding area. Informed consent was signed on the chart. Patient was seen history was reviewed and exam was performed.   The patient was then taken to the operating room and placed in the supine position with SCD hose on. General anesthesia was then induced without difficulty. She was then placed in the dorsolithotomy position. The perineum and vagina were prepped in usual fashion and a Foley catheter was inserted into the bladder under sterile conditions. The abdomen was prepped with 2 chlor prep sponges per protocol. After allowing the prep to dry the patient was then draped. Timeout was performed to confirm the patient, procedure, antibiotic, allergy status. A vertical midline incision was made with a knife and carried down to the underlying fashion using Bovie cautery. The fascial score the midline and the fascial incision was extended superiorly and inferiorly  without difficulty. Buchwalter self-retaining retractor was then placed on the bed. The mass was then delivered to the abdominal incision without difficulty. The posterior leaf of the broad ligament on the right side was opened. The ureter was identified. A window was made between the ureter and the ovarian vessels. The vessels were clamped x3 transected and suture ligated. We continued freeing the mass up from peritoneal based adhesions. There were some adhesions of the right pelvic mass to epiploica of the rectosigmoid colon. These were taken down with Bovie cautery. At this time the uterus was noted to be surgically absent. The right adnexa was amputated from the peritoneum and submitted to frozen section for evaluation.  Our attention was drawn to the left side. The posterior leaf of the broad ligament left side was opened. The ureter was identified a window was made between the IP and the ureter. The IP was clamped x3 transected and suture ligated. The ovary was sent to permanent pathology. The appendix was identified and noted to be normal. The small bowel was run from the ileocecal junction to the ligament of Treitz. There is no nodularity or masses identified. The omentum was brought down to the pelvis. At this point, several small lesions centimeter or smaller were noted scattered to the omentum. Due to the large size of the omentum as well as the adhesive disease of the right upper quadrant the abdominal incision was extended for improved visualization. Infracolic omentectomy was performed by opening adventitial tissue of the omentum to the transverse colon. Pedicles were created and the ligasure was used for hemostasis. All the nodules were removed. Due to the adhesive disease in the right upper quadrant there was a small rent in the capsule of the liver due  to pulling the omentum down into the pelvis. Hemostasis was obtained this area using the argon beam coagulator. Gelfoam was placed. This area was  to be hemostatic. 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 Patient was given a gram of ertipenam. An appendectomy was performed elevating the tip of the appendix with a Babcock. The mesoappendix was skeletonized and pedicles were created and hemostasis was obtained using the ligasure. We came across the base of the appendix at the level of the cecum with a tonsil clamp. The appendix was crushed the tonsils elevated to suture ties were placed. A third suture of 2-0 Vicryl in a figure-of-eight fashion was placed. The appendix was transected. The appendiceal stump was coagulated to prevent mucocele. The abdomen pelvis were copiously irrigated. Again the right upper quadrant was inspected and noted to be hemostatic. The Buchwalter was removed and the bad in all laparotomy sponges were removed.  The fascia was closed in running mass closure with #1 PDS. The subcutaneous tissues were irrigated. Exparel was used for postoperative pain control. The skin was closed using staples.  All instrument, needle, laparotomy, Ray-Tec counts were correct x2. The patient was taken to the recovery stable condition. All instrument needle and Ray-Tec counts were correct x2. The patient tolerated the procedure well and was taken to the recovery room in stable condition. This is Kristin Webb dictating an operative note on patient Kristin Webb.

## 2012-11-08 NOTE — Progress Notes (Signed)
Placed pt on cpap for rest, home settings of 12cm h2o with 2l o2 bleedin.  Pt is wearing her full face mask and tubing from home and tolerating well at this time.  HR 92, rr16, sats98%.  Sterile water added to fill line of humidity chamber. Pt was advised that RT available all night should she need any further assistance.  RN notified.

## 2012-11-08 NOTE — H&P (View-Only) (Signed)
Consult Note: Gyn-Onc  Kristin Webb 66 y.o. female  CC:  Chief Complaint  Patient presents with  . Pelvic Mass    New patient    HPI: Patient is seen today in consultation at the request of Dr. McNeill.  Patient is a 66-year-old gravida 2 para 2 who at about the third week of January started noticing some increasing swelling in her abdomen. Initially the pain was in swelling is somewhat intermittent and it would come and go and she felt was related to starting calcium supplementation as she her that that could cause symptoms. In February the pain became much more constant. In addition, it has been fairly normal for her to have 5-6 bowel movements a day and diarrhea which has been chronic after her cholecystectomy. She usually has a bowel movement about 15 minutes after eating. This changed as well. She started having some increasing constipation and her stools are more formed. She started to feel that she was not emptying her stools.   She had a CT scan of the abdomen and pelvis on March 25. It revealed no significant biliary dilation. There were no suspicious liver lesions. The spleen, adrenal glands and pancreas appeared normal. There is 1.2 cm calculus in the interpolar region of the left kidney. 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 She has a large midabdominal mass measuring 17 x 22.7 cm x 20 cm cephalad. It is well circumscribed without calcifications. There is irregular thickened septations and areas of solid nodularity consistent with malignancy. There is no bowel obstruction. Appears to be separate from the appendix. Probable a normal left ovarian tissue seen. The uterus is surgically absent. There is some soft tissue stranding at the base of the mesentery superior to the mass. There is also some omental nodularity. It is for this that she is referred to see us today. She did have a CA 125 drawn the results which are not available to me at this time. She does not remember  when her last mammogram was. She had a normal screening colonoscopy in July 2013.  Review of Systems: Much of the review of systems is as above. She does have slight shortness of breath but is no worse and has been. She denies any chest pain. She can climb a flight of stairs without difficulty. She's noticed no issues with her eating. She does not have any early satiety. She's noticed no decrease in her bloating or discomfort related to either eating, not eating, or having a bowel movement. She denies any nausea vomiting.  Remainder of 10 point review of systems is negative.  Current Meds:  Outpatient Encounter Prescriptions as of 11/02/2012  Medication Sig Dispense Refill  . Armodafinil (NUVIGIL) 250 MG tablet Take 250 mg by mouth daily.      . BIOTIN PO Take by mouth daily.      . Calcium Citrate-Vitamin D (CALCIUM CITRATE + D PO) Take by mouth daily.      . Cholecalciferol (VITAMIN D3) 5000 UNITS CAPS Take by mouth daily.      . Eszopiclone (ESZOPICLONE) 3 MG TABS Take 3 mg by mouth at bedtime. Take immediately before bedtime      . fish oil-omega-3 fatty acids 1000 MG capsule Take 1 g by mouth 2 (two) times daily.      . FLUoxetine (PROZAC) 10 MG tablet Take 30 mg by mouth daily.      . glucosamine-chondroitin 500-400 MG tablet Take 1 tablet by mouth 2 (two)   times daily.      . MELATONIN PO Take by mouth at bedtime.      . Rosuvastatin Calcium (CRESTOR PO) Take by mouth at bedtime.       No facility-administered encounter medications on file as of 11/02/2012.    Allergy:  Allergies  Allergen Reactions  . Bee Venom Hives    Difficulty breathing, carries an EPI-pen  . Cortisone     Injected cortisone, "sick to my stomach, throwing up, hand swelling, and pain."    Social Hx:   History   Social History  . Marital Status: Single    Spouse Name: N/A    Number of Children: N/A  . Years of Education: N/A   Occupational History  . Not on file.   Social History Main Topics  .  Smoking status: Never Smoker   . Smokeless tobacco: Not on file  . Alcohol Use: No  . Drug Use: No  . Sexually Active: Not on file   Other Topics Concern  . Not on file   Social History Narrative  . No narrative on file    Past Surgical Hx:  Past Surgical History  Procedure Laterality Date  . Cholecystectomy      early 1990s  . Abdominal hysterectomy      early 1990s  . Trigger finger release      Past Medical Hx:  Past Medical History  Diagnosis Date  . Pelvic mass in female   . Constipation   . Diarrhea     in the past after gallbladder removal  . Sleep apnea     CPAP  . Hyperlipidemia   . Burning mouth syndrome   . Chronic fatigue   . Fibromyalgia   . Lumbar disc disease     Family Hx:  Family History  Problem Relation Age of Onset  . Lung cancer Mother   . Lung cancer Father     Vitals:  Blood pressure 160/90, pulse 88, temperature 98.4 F (36.9 C), resp. rate 24, height 5' (1.524 m), weight 188 lb 4.8 oz (85.412 kg).  Physical Exam:  Well-nourished well-developed female in no acute distress.  Neck: Supple, no lymphadenopathy, no thyromegaly.  Lungs: Clear to auscultation bilaterally.  Cardiovascular: Regular rate and rhythm.  Abdomen: Massively enlarged with a pelvic mass that extends almost to the xiphoid. It is nontender. There is no appreciable fluid wave. Exam is somewhat limited by habitus.  Groins: No lymphadenopathy.  Extremities: No edema.  Pelvic: External genitalia notable for erythema consistent with Candida. Bimanual examination reveals a mass to be high out of the pelvis. On pelvic examination there is no nodularity. Rectal confirms.  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 I do not have her CA 125 available. However based on size and symptoms she needs to undergo surgical removal of this. She is tentatively scheduled for surgery on April 1. We'll proceed with exploratory  laparotomy and removal of this mass. The mass be sent for frozen section. We'll remove the contralateral ovary as well. Should the mass returned as benign we'll evaluate the omentum to ensure the other nodule areas are not of any significance. Should it be a malignancy she'll undergo omentectomy and appropriate staging.  Risks of surgery including but not limited to bleeding, infection, thromboembolic disease were discussed with the patient. She understands that she'll have SCD hose on during the surgery receive Lovenox injections while in house. Should this be a malignancy she'll undergo Lovenox   injections when she goes home. She was instructed to bring her CPAP machine to the hospital on the day of surgery.  Should go to the preoperative screening process for the hospital.  Her questions were elicited in answer to her satisfaction. She has to negotiate the surgical date with her family due to family conflicts.  She is currently posted for 11/08/12 and she may choose to change to the following week.  Harlym Gehling A., MD 11/02/2012, 12:39 PM  

## 2012-11-08 NOTE — Transfer of Care (Signed)
Immediate Anesthesia Transfer of Care Note  Patient: Kristin Webb  Procedure(s) Performed: Procedure(s) with comments: EXPLORATORY LAPAROTOMY TOTAL ABDOMINAL HYSTERECTOMY BILATERAL SALPINGO OOPHORECTOMY TUMOR DEBULKING  (Bilateral) - APPENDECTOMY / OMENTECTOMY  Patient Location: PACU  Anesthesia Type:General  Level of Consciousness: awake, alert  and patient cooperative  Airway & Oxygen Therapy: Patient Spontanous Breathing and Patient connected to face mask oxygen  Post-op Assessment: Report given to PACU RN, Post -op Vital signs reviewed and stable and Patient moving all extremities  Post vital signs: stable  Complications: No apparent anesthesia complications

## 2012-11-08 NOTE — Interval H&P Note (Signed)
History and Physical Interval Note:  11/08/2012 1:11 PM  Kristin Webb  has presented today for surgery, with the diagnosis of PELVIC MASS   The various methods of treatment have been discussed with the patient and family. After consideration of risks, benefits and other options for treatment, the patient has consented to  Procedure(s): EXPLORATORY LAPAROTOMY TOTAL ABDOMINAL HYSTERECTOMY BILATERAL SALPINGO OOPHORECTOMY TUMOR DEBULKING  (Bilateral) as a surgical intervention .  The patient's history has been reviewed, patient examined, no change in status, stable for surgery.  I have reviewed the patient's chart and labs.  Questions were answered to the patient's satisfaction.     [name]

## 2012-11-09 ENCOUNTER — Encounter (HOSPITAL_COMMUNITY): Payer: Self-pay | Admitting: Gynecologic Oncology

## 2012-11-09 LAB — CBC
HCT: 36.4 % (ref 36.0–46.0)
Hemoglobin: 12 g/dL (ref 12.0–15.0)
MCH: 29.4 pg (ref 26.0–34.0)
MCHC: 33 g/dL (ref 30.0–36.0)
MCV: 89.2 fL (ref 78.0–100.0)
Platelets: 155 K/uL (ref 150–400)
RBC: 4.08 MIL/uL (ref 3.87–5.11)
RDW: 13.5 % (ref 11.5–15.5)
WBC: 6.4 K/uL (ref 4.0–10.5)

## 2012-11-09 LAB — BASIC METABOLIC PANEL WITH GFR
BUN: 9 mg/dL (ref 6–23)
CO2: 30 meq/L (ref 19–32)
Calcium: 8.1 mg/dL — ABNORMAL LOW (ref 8.4–10.5)
Chloride: 100 meq/L (ref 96–112)
Creatinine, Ser: 0.6 mg/dL (ref 0.50–1.10)
GFR calc Af Amer: >90 mL/min
GFR calc non Af Amer: >90 mL/min
Glucose, Bld: 135 mg/dL — ABNORMAL HIGH (ref 70–99)
Potassium: 4.2 meq/L (ref 3.5–5.1)
Sodium: 134 meq/L — ABNORMAL LOW (ref 135–145)

## 2012-11-09 MED ORDER — DIPHENHYDRAMINE HCL 25 MG PO CAPS
25.0000 mg | ORAL_CAPSULE | Freq: Every evening | ORAL | Status: DC | PRN
  Administered 2012-11-09 – 2012-11-10 (×2): 50 mg via ORAL
  Administered 2012-11-11: 25 mg via ORAL
  Filled 2012-11-09 (×3): qty 2

## 2012-11-09 MED ORDER — FLUOXETINE HCL 20 MG PO CAPS
30.0000 mg | ORAL_CAPSULE | Freq: Every evening | ORAL | Status: DC
  Filled 2012-11-09 (×4): qty 1

## 2012-11-09 MED ORDER — DIPHENHYDRAMINE HCL 25 MG PO CAPS: 25.0000 mg | ORAL_CAPSULE | Freq: Every evening | ORAL | Status: DC | PRN

## 2012-11-09 MED ORDER — MELATONIN 10 MG PO TABS: 10.0000 mg | ORAL_TABLET | Freq: Every day | ORAL | Status: DC

## 2012-11-09 NOTE — Progress Notes (Signed)
Patient states Caffeine is an intolerance vs and allergy and wishes that this be removed from her chart as an allergy.

## 2012-11-09 NOTE — Care Management Note (Signed)
    Page 1 of 1   11/09/2012     12:10:51 PM   CARE MANAGEMENT NOTE 11/09/2012  Patient:  Kristin Webb   Account Number:  [id number]  Date Initiated:  11/09/2012  Documentation initiated by:  Lorenda Ishihara  Subjective/Objective Assessment:   67 yo female admitted s/p expl lap, BSO, appy, omenectomy, debulking. PTA lived at home with spouse.     Action/Plan:   Home when stable   Anticipated DC Date:  11/12/2012   Anticipated DC Plan:  HOME/SELF CARE      DC Planning Services  CM consult      Choice offered to / List presented to:             Status of service:  Completed, signed off Medicare Important Message given?   (If response is "NO", the following Medicare IM given date fields will be blank) Date Medicare IM given:   Date Additional Medicare IM given:    Discharge Disposition:  HOME/SELF CARE  Per UR Regulation:  Reviewed for med. necessity/level of care/duration of stay  If discussed at Long Length of Stay Meetings, dates discussed:    Comments:

## 2012-11-09 NOTE — Progress Notes (Signed)
PHARMACIST - PHYSICIAN ORDER COMMUNICATION  CONCERNING: P&T Medication Policy on Herbal Medications  DESCRIPTION:  This patient's order for:  MELATONIN  has been noted.  This product(s) is classified as an "herbal" or natural product. Due to a lack of definitive safety studies or FDA approval, nonstandard manufacturing practices, plus the potential risk of unknown drug-drug interactions while on inpatient medications, the Pharmacy and Therapeutics Committee does not permit the use of "herbal" or natural products of this type within [facility].   ACTION TAKEN: The pharmacy department is unable to verify this order at this time and your patient has been informed of this safety policy. Please reevaluate patient's clinical condition at discharge and address if the herbal or natural product(s) should be resumed at that time.  Thanks, Dorethea Clan, Pharm D. 11/09/2012

## 2012-11-09 NOTE — Progress Notes (Signed)
Utilization review completed.  

## 2012-11-09 NOTE — Progress Notes (Signed)
Pt brought her home cpap unit from home to wear tonight.  Machine turned on and appeared to be working fine, no frays or defects noted on cord.  RT offered assistance with cpap tonight, but pt stated that she is fine and will place it on herself later when ready.  Sterile water added to humidity chamber.  RN notified and asked to get md rx for home cpap use.  Service response called for [employer] to inspect machine.

## 2012-11-09 NOTE — Progress Notes (Signed)
1 Day Post-Op Procedure(s) (LRB): EXPLORATORY LAPAROTOMY TOTAL ABDOMINAL HYSTERECTOMY BILATERAL SALPINGO OOPHORECTOMY TUMOR DEBULKING  (Bilateral)  Subjective: Patient reports tolerating PO intake.  Ambulating with assist.  Pain rating at a 3 or 4.  Denies nausea, vomiting, chest pain, dyspnea, passing flatus, or having a bowel movement.  Objective: Vital signs in last 24 hours: Temp:  [97.3 F (36.3 C)-98.7 F (37.1 C)] 98.7 F (37.1 C) (04/02 0611) Pulse Rate:  [66-92] 90 (04/02 0611) Resp:  [9-22] 22 (04/02 0746) BP: (110-165)/(65-102) 114/69 mmHg (04/02 0611) SpO2:  [94 %-100 %] 98 % (04/02 0746) Weight:  [187 lb 12.8 oz (85.186 kg)] 187 lb 12.8 oz (85.186 kg) (04/01 1737) Last BM Date: 11/08/12  Intake/Output from previous day: 04/01 0701 - 04/02 0700 In: 4543.8 [I.V.:4543.8] Out: 1975 [Urine:1275; Blood:100]  Physical Examination: General: alert, cooperative and no distress Resp: mildly diminished in the bases Cardio: regular rate and rhythm, S1, S2 normal, no murmur, click, rub or gallop GI: soft, non-tender; bowel sounds normal; no masses,  no organomegaly and incision: midline incision with staples, stain-marked dressing removed, no drainage noted Extremities: extremities normal, atraumatic, no cyanosis or edema  Labs: WBC/Hgb/Hct/Plts:  6.4/12.0/36.4/155 (04/02 0419) BUN/Cr/glu/ALT/AST/amyl/lip:  9/0.60/--/--/--/--/-- (04/02 0419)  Assessment: 67 y.o. s/p Procedure(s): EXPLORATORY LAPAROTOMY TOTAL ABDOMINAL HYSTERECTOMY BILATERAL SALPINGO OOPHORECTOMY TUMOR DEBULKING : stable Pain:  Pain is well-controlled on PCA.  Heme:  Stable post-operatively.  CV: BP and HR stable post-operatively. Hx HTN.  GI:  Tolerating po: Yes.     FEN:  Stable post-operatively.  Prophylaxis: intermittent pneumatic compression boots and Lovenox 40 mg SQ daily.  Plan: Discontinue PCA Saline lock IV Encourage IS use, deep breathing, and coughing Encourage ambulation Continue  post-operative plan of care   LOS: 1 day    Coda Filler DEAL 11/09/2012, 9:40 AM

## 2012-11-10 MED ORDER — SENNOSIDES-DOCUSATE SODIUM 8.6-50 MG PO TABS
2.0000 | ORAL_TABLET | Freq: Once | ORAL | Status: AC
  Administered 2012-11-10: 2 via ORAL
  Filled 2012-11-10: qty 2

## 2012-11-10 MED ORDER — IBUPROFEN 600 MG PO TABS
600.0000 mg | ORAL_TABLET | Freq: Three times a day (TID) | ORAL | Status: DC
  Administered 2012-11-10 – 2012-11-11 (×3): 600 mg via ORAL
  Filled 2012-11-10 (×6): qty 1

## 2012-11-10 NOTE — Progress Notes (Signed)
PT has brought in her home CPAP machine to use at night and is fine placing it on herself. RT will assist as needed.

## 2012-11-10 NOTE — Progress Notes (Signed)
Pt unable to void this am. In and out cath done. removed. Will continue to monitor. Kristin Webb

## 2012-11-10 NOTE — Progress Notes (Signed)
2 Days Post-Op Procedure(s) (LRB): EXPLORATORY LAPAROTOMY TOTAL ABDOMINAL HYSTERECTOMY BILATERAL SALPINGO OOPHORECTOMY TUMOR DEBULKING  (Bilateral)  Subjective: Patient reports tolerating PO intake.  Ambulating with assist.  Pain rating at a 4 this am with minimal relief after taking Percocet.  Reporting spasm-like abdominal pain intermittently.  Denies nausea, vomiting, chest pain, dyspnea, passing flatus, or having a bowel movement.  Objective: Vital signs in last 24 hours: Temp:  [98 F (36.7 C)-99 F (37.2 C)] 98 F (36.7 C) (04/03 0629) Pulse Rate:  [70-82] 70 (04/03 0629) Resp:  [18-20] 18 (04/03 0629) BP: (117-126)/(69-79) 126/79 mmHg (04/03 0629) SpO2:  [90 %-96 %] 93 % (04/03 0629) Last BM Date: 11/08/12  Intake/Output from previous day: 04/02 0701 - 04/03 0700 In: 740 [P.O.:240; I.V.:500] Out: 1425 [Urine:1425]  Physical Examination: General: alert, cooperative and no distress Resp: clear to auscultation bilaterally Cardio: regular rate and rhythm, S1, S2 normal, no murmur, click, rub or gallop GI: soft, non-tender; bowel sounds normal; no masses,  no organomegaly and incision: midline incision with staples, no drainage noted Extremities: extremities normal, atraumatic, no cyanosis or edema  Assessment: 67 y.o. s/p Procedure(s): EXPLORATORY LAPAROTOMY TOTAL ABDOMINAL HYSTERECTOMY BILATERAL SALPINGO OOPHORECTOMY TUMOR DEBULKING : stable Pain:  Pain is well-controlled on oral medications.  Heme:  Stable post-operatively.  CV: BP and HR stable post-operatively. Hx HTN.  GI:  Tolerating po: Yes.     FEN:  Stable post-operatively.  Prophylaxis: intermittent pneumatic compression boots and Lovenox 40 mg SQ daily.  Plan: Scheduled ibuprofen for pain relief Senna-S to stimulate bowels Kpad Encourage IS use, deep breathing, and coughing Encourage ambulation Continue post-operative plan of care   LOS: 2 days    CROSS, MELISSA DEAL 11/10/2012, 9:03 AM

## 2012-11-11 ENCOUNTER — Inpatient Hospital Stay (HOSPITAL_COMMUNITY): Payer: Medicare Other

## 2012-11-11 LAB — COMPREHENSIVE METABOLIC PANEL
ALT: 30 U/L (ref 0–35)
AST: 38 U/L — ABNORMAL HIGH (ref 0–37)
Albumin: 3.2 g/dL — ABNORMAL LOW (ref 3.5–5.2)
Alkaline Phosphatase: 63 U/L (ref 39–117)
BUN: 11 mg/dL (ref 6–23)
CO2: 27 mEq/L (ref 19–32)
Calcium: 9.1 mg/dL (ref 8.4–10.5)
Chloride: 98 mEq/L (ref 96–112)
Creatinine, Ser: 0.54 mg/dL (ref 0.50–1.10)
GFR calc Af Amer: >90 mL/min (ref >90)
GFR calc non Af Amer: >90 mL/min (ref >90)
Glucose, Bld: 134 mg/dL — ABNORMAL HIGH (ref 70–99)
Potassium: 4.2 mEq/L (ref 3.5–5.1)
Sodium: 135 mEq/L (ref 135–145)
Total Bilirubin: 0.4 mg/dL (ref 0.3–1.2)
Total Protein: 6.5 g/dL (ref 6.0–8.3)

## 2012-11-11 LAB — CBC
HCT: 40.1 % (ref 36.0–46.0)
Hemoglobin: 13 g/dL (ref 12.0–15.0)
MCH: 28.8 pg (ref 26.0–34.0)
MCHC: 32.4 g/dL (ref 30.0–36.0)
MCV: 88.7 fL (ref 78.0–100.0)
Platelets: 185 10*3/uL (ref 150–400)
RBC: 4.52 MIL/uL (ref 3.87–5.11)
RDW: 13.4 % (ref 11.5–15.5)
WBC: 7.7 10*3/uL (ref 4.0–10.5)

## 2012-11-11 MED ORDER — BISACODYL 10 MG RE SUPP
10.0000 mg | Freq: Once | RECTAL | Status: AC
  Administered 2012-11-11: 10 mg via RECTAL
  Filled 2012-11-11: qty 1

## 2012-11-11 MED ORDER — HYDROMORPHONE HCL PF 1 MG/ML IJ SOLN: 0.5000 mg | INTRAMUSCULAR | Status: DC | PRN

## 2012-11-11 MED ORDER — IBUPROFEN 600 MG PO TABS
600.0000 mg | ORAL_TABLET | Freq: Four times a day (QID) | ORAL | Status: DC
  Administered 2012-11-11 (×3): 600 mg via ORAL
  Filled 2012-11-11 (×7): qty 1

## 2012-11-11 MED ORDER — KCL IN DEXTROSE-NACL 20-5-0.45 MEQ/L-%-% IV SOLN
INTRAVENOUS | Status: DC
  Administered 2012-11-11 – 2012-11-12 (×3): via INTRAVENOUS
  Filled 2012-11-11 (×5): qty 1000

## 2012-11-11 MED ORDER — PROMETHAZINE HCL 25 MG/ML IJ SOLN: 6.2500 mg | Freq: Four times a day (QID) | INTRAMUSCULAR | Status: DC | PRN

## 2012-11-11 NOTE — Progress Notes (Signed)
3 Days Post-Op Procedure(s) (LRB): EXPLORATORY LAPAROTOMY TOTAL ABDOMINAL HYSTERECTOMY BILATERAL SALPINGO OOPHORECTOMY TUMOR DEBULKING  (Bilateral)  Subjective: Patient reports nausea last pm and this am.  Denies emesis.  "I did not eat breakfast this am because I felt sick."  Ambulating with assist.  Reporting that Percocet "just makes me sleepy and doesn't help with the pain."  Reporting some relief with scheduled ibuprofen and heating pad use.  Reporting spasm-like abdominal pain intermittently.  Passing flatus and voiding without difficulty.  Denies vomiting, chest pain, dyspnea, or having a bowel movement.  Objective: Vital signs in last 24 hours: Temp:  [97.9 F (36.6 C)-98.6 F (37 C)] 97.9 F (36.6 C) (04/04 0539) Pulse Rate:  [76-88] 83 (04/04 0539) Resp:  [18-20] 20 (04/04 0539) BP: (127-158)/(80-85) 158/85 mmHg (04/04 0539) SpO2:  [92 %-94 %] 93 % (04/04 0539) Last BM Date: 11/08/12  Intake/Output from previous day: 04/03 0701 - 04/04 0700 In: -  Out: 950 [Urine:950]  Physical Examination: General: alert, cooperative and no distress Resp: clear to auscultation bilaterally Cardio: regular rate and rhythm, S1, S2 normal, no murmur, click, rub or gallop GI: abnormal findings:  hypoactive bowel sounds, obese and mildly distended and tympanic on percussion and incision: midline incision with staples, no drainage noted Extremities: extremities normal, atraumatic, no cyanosis or edema  Assessment: 67 y.o. s/p Procedure(s): EXPLORATORY LAPAROTOMY TOTAL ABDOMINAL HYSTERECTOMY BILATERAL SALPINGO OOPHORECTOMY TUMOR DEBULKING : stable Pain:  Pain is moderately controlled on oral medications.  Heme:  Stable post-operatively.  CV: BP and HR stable post-operatively. Hx HTN.  GI:  Tolerating po: No due to nausea this am.  Passing flatus.     FEN:  Stable post-operatively.  Prophylaxis: intermittent pneumatic compression boots and Lovenox 40 mg SQ daily.  Plan: CBC and Cmet  now Abdomen 2 view now Sips of clear liquids until nausea resolves Restart IVF Dilaudid IV PRN for breakthrough pain Phenergan IV PRN for nausea not relieved with Zofran use Increase frequency of scheduled ibuprofen for pain relief per patient request Dulcolax to stimulate bowels Continue Kpad use Encourage IS use, deep breathing, and coughing Encourage ambulation Continue post-operative plan of care   LOS: 3 days    CROSS, MELISSA DEAL 11/11/2012, 11:21 AM

## 2012-11-11 NOTE — Progress Notes (Signed)
Pt has been using her home CPAP unit for night time. RT will assist as needed.

## 2012-11-12 MED ORDER — TRAMADOL HCL 50 MG PO TABS
50.0000 mg | ORAL_TABLET | Freq: Four times a day (QID) | ORAL | Status: DC | PRN
  Administered 2012-11-12: 50 mg via ORAL
  Filled 2012-11-12: qty 1

## 2012-11-12 MED ORDER — ENOXAPARIN SODIUM 40 MG/0.4ML ~~LOC~~ SOLN: 40.0000 mg | SUBCUTANEOUS | Status: DC

## 2012-11-12 MED ORDER — TRAMADOL HCL 50 MG PO TABS: 50.0000 mg | ORAL_TABLET | Freq: Four times a day (QID) | ORAL | Status: DC | PRN

## 2012-11-12 MED ORDER — ACETAMINOPHEN 325 MG PO TABS
650.0000 mg | ORAL_TABLET | Freq: Four times a day (QID) | ORAL | Status: DC | PRN
  Administered 2012-11-12: 650 mg via ORAL
  Filled 2012-11-12: qty 2

## 2012-11-12 MED ORDER — ENOXAPARIN (LOVENOX) PATIENT EDUCATION KIT
PACK | Freq: Once | Status: DC
  Filled 2012-11-12: qty 1

## 2012-11-12 NOTE — Progress Notes (Signed)
4 Days Post-Op Procedure(s) (LRB): EXPLORATORY LAPAROTOMY TOTAL ABDOMINAL HYSTERECTOMY BILATERAL SALPINGO OOPHORECTOMY TUMOR DEBULKING  (Bilateral)  Subjective: Patient reports tolerating PO, + flatus, + BM and no problems voiding.    Objective: I have reviewed patient's vital signs, intake and output, medications, labs and pathology.  General: alert and no distress Resp: clear to auscultation bilaterally Cardio: regular rate and rhythm, S1, S2 normal, no murmur, click, rub or gallop GI: normal findings: bowel sounds normal, soft, non-tender and Incision C, D, I. Extremities: extremities normal, atraumatic, no cyanosis or edema Vaginal Bleeding: none  Assessment: s/p Procedure(s) with comments: EXPLORATORY LAPAROTOMY TOTAL ABDOMINAL HYSTERECTOMY BILATERAL SALPINGO OOPHORECTOMY TUMOR DEBULKING  (Bilateral) - APPENDECTOMY / OMENTECTOMY: stable, progressing well and tolerating diet  Plan: Advance diet Discharge home  LOS: 4 days    Rucker Pridgeon A 11/12/2012, 9:15 AM

## 2012-11-12 NOTE — Discharge Summary (Signed)
Physician Discharge Summary  Patient ID: Kristin Webb MRN: [id number] DOB/AGE: [id number] y.o.  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 *   Discharged Condition: good  Hospital Course: Patient underwent TAH/BSO and tumor debulking.  There were no intraoperative complications.  Postoperative course was uncomplicated.  Discharged home in good condition.  Consults: None  Significant Diagnostic Studies: labs: CBC, CMET  Treatments: IV hydration, analgesia: Dilaudid, anticoagulation: LMW heparin and surgery: TAH/BSO and tumor debulking.  Discharge Exam: Blood pressure 156/96, pulse 70, temperature 98.6 F (37 C), temperature source Oral, resp. rate 18, height 5' (1.524 m), weight 187 lb 12.8 oz (85.186 kg), SpO2 96.00%. General appearance: alert and no distress Resp: clear to auscultation bilaterally Cardio: regular rate and rhythm, S1, S2 normal, no murmur, click, rub or gallop GI: normal findings: soft, non-tender Extremities: extremities normal, atraumatic, no cyanosis or edema Incision/Wound:  Clean, dry and intact.  Disposition: Final discharge disposition not confirmed  Discharge Orders   Future Appointments Provider Department Dept Phone   11/22/2012 3:30 PM Paola A. Duard Brady, MD [facility] [phone]   Future Orders Complete By Expires     Diet - low sodium heart healthy  As directed     Discharge instructions  As directed     Comments:      Routine    Discharge wound care:  As directed     Comments:      Keep incision clean and dry.    Driving Restrictions  As directed      Comments:      No driving for 2 weeks.    Increase activity slowly  As directed     Lifting restrictions  As directed     Comments:      No lifting greater than 10 lbs.    No dressing needed  As directed     Other Restrictions  As directed     Comments:      No tub baths for 2 weeks.    Sexual Activity Restrictions  As directed     Comments:      No sex for 6 weeks.        Medication List    TAKE these medications       Biotin 1 MG Caps  Take 1 mg by mouth daily.     CALCIUM CITRATE + D PO  Take 1 capsule by mouth daily.     diphenhydrAMINE 25 mg capsule  Commonly known as:  BENADRYL  Take 50-75 mg by mouth at bedtime as needed for sleep.     enoxaparin 40 MG/0.4ML injection  Commonly known as:  LOVENOX  Inject 0.4 mLs (40 mg total) into the skin daily.  EPIPEN 2-PAK 0.3 mg/0.3 mL Devi  Generic drug:  EPINEPHrine  Inject 0.3 mg into the muscle once. For bee stings     Eszopiclone 3 MG Tabs  Take 3 mg by mouth at bedtime. Take immediately before bedtime     fish oil-omega-3 fatty acids 1000 MG capsule  Take 1 g by mouth 2 (two) times daily.     FLUoxetine 10 MG capsule  Commonly known as:  PROZAC  Take 30 mg by mouth every evening.     glucosamine-chondroitin 500-400 MG tablet  Take 1 tablet by mouth 2 (two) times daily.     Melatonin 10 MG Tabs  Take 10 mg by mouth at bedtime.     multivitamin with minerals Tabs  Take 1 tablet by mouth daily.     NUVIGIL 250 MG tablet  Generic drug:  Armodafinil  Take 250 mg by mouth every morning.     OVER THE COUNTER MEDICATION  Nystatin topical powder   100,000 units/ GM   Patient uses as needed     rosuvastatin 5 MG tablet  Commonly known as:  CRESTOR  Take 5 mg by mouth every evening.     traMADol 50 MG tablet  Commonly known as:  ULTRAM  Take 1 tablet (50 mg total) by mouth every 6 (six) hours as needed for pain.     Vitamin D3 5000 UNITS Caps  Take 5,000 mg by mouth daily.            Follow-up Information   Follow up with [facility] A., MD. Schedule an appointment as soon as possible for a visit in 1 week.   Contact information:   [address] [place] [address] [phone]       Signed: Jarrius Huaracha A 11/12/2012, 9:34 AM

## 2012-11-15 ENCOUNTER — Other Ambulatory Visit: Payer: Self-pay

## 2012-11-15 MED ORDER — ARMODAFINIL 250 MG PO TABS: ORAL_TABLET | ORAL | Status: DC

## 2012-11-17 ENCOUNTER — Ambulatory Visit: Payer: Medicare Other | Attending: Gynecologic Oncology | Admitting: Gynecologic Oncology

## 2012-11-17 ENCOUNTER — Encounter: Payer: Self-pay | Admitting: Gynecologic Oncology

## 2012-11-17 VITALS — BP 140/62 | HR 88 | Temp 97.7°F | Resp 16 | Ht 60.0 in | Wt 172.1 lb

## 2012-11-17 DIAGNOSIS — C561 Malignant neoplasm of right ovary: Secondary | ICD-10-CM

## 2012-11-17 DIAGNOSIS — C5700 Malignant neoplasm of unspecified fallopian tube: Secondary | ICD-10-CM | POA: Insufficient documentation

## 2012-11-17 DIAGNOSIS — C569 Malignant neoplasm of unspecified ovary: Secondary | ICD-10-CM | POA: Insufficient documentation

## 2012-11-17 NOTE — Progress Notes (Signed)
Consult Note: Gyn-Onc  Kristin Webb 67 y.o. female  CC:  Chief Complaint  Patient presents with  . Ovarian Cancer    Follow up post-op    HPI:   Patient is a 67 year old gravida 2 para 2 who at about the third week of January started noticing some increasing swelling in her abdomen. Initially the pain was in swelling is somewhat intermittent and it would come and go and she felt was related to starting calcium supplementation as she her that that could cause symptoms. In February the pain became much more constant. In addition, it has been fairly normal for her to have 5-6 bowel movements a day and diarrhea which has been chronic after her cholecystectomy. She usually has a bowel movement about 15 minutes after eating. This changed as well. She started having some increasing constipation and her stools are more formed. She started to feel that she was not emptying her stools.   She had a CT scan of the abdomen and pelvis on March 25. It revealed no significant biliary dilation. There were no suspicious liver lesions. The spleen, adrenal glands and pancreas appeared normal. There is 1.2 cm calculus in the interpolar region of the left kidney. 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 She has a large midabdominal mass measuring 17 x 22.7 cm x 20 cm cephalad. It is well circumscribed without calcifications. There is irregular thickened septations and areas of solid nodularity consistent with malignancy. There is no bowel obstruction. Appears to be separate from the appendix. Probable a normal left ovarian tissue seen. The uterus is surgically absent. There is some soft tissue stranding at the base of the mesentery superior to the mass. There is also some omental nodularity. It is for this that she is referred to see [place] today. She did have a CA 125 drawn the results which are not available to me at this time. She does not remember when her last mammogram was. She had a normal screening  colonoscopy in July 2013.  Operative findings: 25 cm right adnexal mass with smooth surface. Surgically absent uterus. Atrophic-appearing left ovary. Normal appearing appendix. Within the omentum there were centimeter nodules scattered throughout the omentum. The remainder of the surfaces were benign.  Diagnosis 1. 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 - BENIGN FALLOPIAN TUBE WITH NONSPECIFIC CHRONIC INFLAMMATION. 2. Ovary and fallopian tube, left - BENIGN OVARY; NEGATIVE FOR ATYPIA OR MALIGNANCY. - BENIGN FALLOPIAN TUBE; NEGATIVE FOR ATYPIA OR MALIGNANCY. 3. Omentum, resection for tumor - HIGH GRADE CARCINOMA, SEE COMMENT. 4. Appendix, Other than Incidental - FIBROUS OBLITERATION OF APPENDICEAL TIP. - NEGATIVE FOR MALIGNANCY.   Current Meds:  Outpatient Encounter Prescriptions as of 11/17/2012  Medication Sig Dispense Refill  . acetaminophen (TYLENOL) 325 MG tablet Take 650 mg by mouth every 6 (six) hours as needed for pain.      . diphenhydrAMINE (BENADRYL) 25 mg capsule Take 50-75 mg by mouth at bedtime as needed for sleep.       Marland Kitchen enoxaparin (LOVENOX) 40 MG/0.4ML injection Inject 0.4 mLs (40 mg total) into the skin daily.  14 Syringe  0  . traMADol (ULTRAM) 50 MG tablet Take 1 tablet (50 mg total) by mouth every 6 (six) hours as needed for pain.  30 tablet  2  . Armodafinil (NUVIGIL) 250 MG tablet One half tablet po in  the morning and an additional one half tablet 2 hours later  30 tablet  5  . Biotin 1 MG CAPS Take 1 mg by mouth daily.      . Calcium Citrate-Vitamin D (CALCIUM CITRATE + D PO) Take 1 capsule by mouth daily.       . Cholecalciferol (VITAMIN D3) 5000 UNITS CAPS Take 5,000 mg by mouth daily.       Marland Kitchen EPINEPHrine (EPIPEN 2-PAK) 0.3 mg/0.3 mL DEVI Inject 0.3 mg into the muscle once. For bee stings      . Eszopiclone  3 MG TABS Take 3 mg by mouth at bedtime. Take immediately before bedtime      . fish oil-omega-3 fatty acids 1000 MG capsule Take 1 g by mouth 2 (two) times daily.      Marland Kitchen FLUoxetine (PROZAC) 10 MG capsule Take 30 mg by mouth every evening.      Marland Kitchen glucosamine-chondroitin 500-400 MG tablet Take 1 tablet by mouth 2 (two) times daily.      . Melatonin 10 MG TABS Take 10 mg by mouth at bedtime.      . Multiple Vitamin (MULTIVITAMIN WITH MINERALS) TABS Take 1 tablet by mouth daily.      Marland Kitchen OVER THE COUNTER MEDICATION Nystatin topical powder  100,000 units/ GM  Patient uses as needed      . rosuvastatin (CRESTOR) 5 MG tablet Take 5 mg by mouth every evening.       No facility-administered encounter medications on file as of 11/17/2012.    Allergy:  Allergies  Allergen Reactions  . Bee Venom Hives    Difficulty breathing, carries an EPI-pen  . Cortisone     Injected cortisone, "sick to my stomach, throwing up, hand swelling, and pain."    Social Hx:   History   Social History  . Marital Status: Married    Spouse Name: N/A    Number of Children: N/A  . Years of Education: N/A   Occupational History  . Not on file.   Social History Main Topics  . Smoking status: Never Smoker   . Smokeless tobacco: Never Used  . Alcohol Use: No  . Drug Use: No  . Sexually Active: Not on file   Other Topics Concern  . Not on file   Social History Narrative  . No narrative on file    Past Surgical Hx:  Past Surgical History  Procedure Laterality Date  . Cholecystectomy      early 41s  . Abdominal hysterectomy      early 1990s  . Trigger finger release    . Laparotomy Bilateral 11/08/2012    Procedure: EXPLORATORY LAPAROTOMY TOTAL ABDOMINAL HYSTERECTOMY BILATERAL SALPINGO OOPHORECTOMY TUMOR DEBULKING ;  Surgeon: Rejeana Brock A. Duard Brady, MD;  Location: [facility];  Service: Gynecology;  Laterality: Bilateral;  APPENDECTOMY / OMENTECTOMY  . Appendectomy    . Exploratory laparotomy  11/08/12    BSO,  appendectomy, omentectomy    Past Medical Hx:  Past Medical History  Diagnosis Date  . Pelvic mass in female   . Constipation   . Diarrhea     in the past after gallbladder removal  . Hyperlipidemia   . Burning mouth syndrome   . Chronic fatigue   . Fibromyalgia   . Lumbar disc disease   . PONV (postoperative nausea and vomiting)   . Hypertension     borderline not on meds   . Shortness of breath     with exertion   .  Sleep apnea     CPAP settings at 12   . Pneumonia     hx of pneumonia   . GERD (gastroesophageal reflux disease)   . Yeast infection     Family Hx:  Family History  Problem Relation Age of Onset  . Lung cancer Mother   . Lung cancer Father     Vitals:  Blood pressure 140/62, pulse 88, temperature 97.7 F (36.5 C), resp. rate 16, height 5' (1.524 m), weight 172 lb 1.6 oz (78.064 kg).  Physical Exam:  Well-nourished well-developed female in no acute distress.  33 staples removed from the midline incision without difficulty.  Mild erythema noted around the last three staples of the lower portion of the incision.  No drainage noted.  1/2 inch steri strips applied with benzoin.  Incision care discussed with the patient and no concerns voiced. Warner Mccreedy, NP  Assessment/Plan: 67 year old with a large abdominal pelvic mass. 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 She was completely resected to an R0. I discussed the pathology with the patient and she is amenable to proceeding with additional treatment. I discussed with her proceeding with 6 cycles of paclitaxel and carboplatin. She has appointment Dr. Darrold Span this coming Monday. She believes that she'll most likely need a Port-A-Cath placed as she has very poor peripheral IV access. She has lost about 16 pounds since her surgery but she is eating well. She is otherwise doing well keep her appointment with Dr. Darrold Span return to see me  for postoperative check.  Cleda Mccreedy A., MD 11/17/2012, 3:48 PM

## 2012-11-17 NOTE — Patient Instructions (Signed)
Carboplatin injection What is this medicine? CARBOPLATIN (KAR boe pla tin) is a chemotherapy drug. It targets fast dividing cells, like cancer cells, and causes these cells to die. This medicine is used to treat ovarian cancer and many other cancers. This medicine may be used for other purposes; ask your health care provider or pharmacist if you have questions. What should I tell my health care provider before I take this medicine? They need to know if you have any of these conditions: -blood disorders -hearing problems -kidney disease -recent or ongoing radiation therapy -an unusual or allergic reaction to carboplatin, cisplatin, other chemotherapy, other medicines, foods, dyes, or preservatives -pregnant or trying to get pregnant -breast-feeding How should I use this medicine? This drug is usually given as an infusion into a vein. It is administered in a hospital or clinic by a specially trained health care professional. Talk to your pediatrician regarding the use of this medicine in children. Special care may be needed. Overdosage: If you think you have taken too much of this medicine contact a poison control center or emergency room at once. NOTE: This medicine is only for you. Do not share this medicine with others. What if I miss a dose? It is important not to miss a dose. Call your doctor or health care professional if you are unable to keep an appointment. What may interact with this medicine? -medicines for seizures -medicines to increase blood counts like filgrastim, pegfilgrastim, sargramostim -some antibiotics like amikacin, gentamicin, neomycin, streptomycin, tobramycin -vaccines Talk to your doctor or health care professional before taking any of these medicines: -acetaminophen -aspirin -ibuprofen -ketoprofen -naproxen This list may not describe all possible interactions. Give your health care provider a list of all the medicines, herbs, non-prescription drugs, or dietary  supplements you use. Also tell them if you smoke, drink alcohol, or use illegal drugs. Some items may interact with your medicine. What should I watch for while using this medicine? Your condition will be monitored carefully while you are receiving this medicine. You will need important blood work done while you are taking this medicine. This drug may make you feel generally unwell. This is not uncommon, as chemotherapy can affect healthy cells as well as cancer cells. Report any side effects. Continue your course of treatment even though you feel ill unless your doctor tells you to stop. In some cases, you may be given additional medicines to help with side effects. Follow all directions for their use. Call your doctor or health care professional for advice if you get a fever, chills or sore throat, or other symptoms of a cold or flu. Do not treat yourself. This drug decreases your body's ability to fight infections. Try to avoid being around people who are sick. This medicine may increase your risk to bruise or bleed. Call your doctor or health care professional if you notice any unusual bleeding. Be careful brushing and flossing your teeth or using a toothpick because you may get an infection or bleed more easily. If you have any dental work done, tell your dentist you are receiving this medicine. Avoid taking products that contain aspirin, acetaminophen, ibuprofen, naproxen, or ketoprofen unless instructed by your doctor. These medicines may hide a fever. Do not become pregnant while taking this medicine. Women should inform their doctor if they wish to become pregnant or think they might be pregnant. There is a potential for serious side effects to an unborn child. Talk to your health care professional or pharmacist for more information.   Do not breast-feed an infant while taking this medicine. What side effects may I notice from receiving this medicine? Side effects that you should report to your  doctor or health care professional as soon as possible: -allergic reactions like skin rash, itching or hives, swelling of the face, lips, or tongue -signs of infection - fever or chills, cough, sore throat, pain or difficulty passing urine -signs of decreased platelets or bleeding - bruising, pinpoint red spots on the skin, black, tarry stools, nosebleeds -signs of decreased red blood cells - unusually weak or tired, fainting spells, lightheadedness -breathing problems -changes in hearing -changes in vision -chest pain -high blood pressure -low blood counts - This drug may decrease the number of white blood cells, red blood cells and platelets. You may be at increased risk for infections and bleeding. -nausea and vomiting -pain, swelling, redness or irritation at the injection site -pain, tingling, numbness in the hands or feet -problems with balance, talking, walking -trouble passing urine or change in the amount of urine Side effects that usually do not require medical attention (report to your doctor or health care professional if they continue or are bothersome): -hair loss -loss of appetite -metallic taste in the mouth or changes in taste This list may not describe all possible side effects. Call your doctor for medical advice about side effects. You may report side effects to FDA at 1-800-FDA-1088. Where should I keep my medicine? This drug is given in a hospital or clinic and will not be stored at home. NOTE: This sheet is a summary. It may not cover all possible information. If you have questions about this medicine, talk to your doctor, pharmacist, or health care provider.  2012, [employer] Standard. (11/01/2007 2:38:05 PM)Paclitaxel injection What is this medicine? PACLITAXEL (PAK li TAX el) is a chemotherapy drug. It targets fast dividing cells, like cancer cells, and causes these cells to die. This medicine is used to treat ovarian cancer, breast cancer, and other  cancers. This medicine may be used for other purposes; ask your health care provider or pharmacist if you have questions. What should I tell my health care provider before I take this medicine? They need to know if you have any of these conditions: -blood disorders -irregular heartbeat -infection (especially a virus infection such as chickenpox, cold sores, or herpes) -liver disease -previous or ongoing radiation therapy -an unusual or allergic reaction to paclitaxel, alcohol, polyoxyethylated castor oil, other chemotherapy agents, other medicines, foods, dyes, or preservatives -pregnant or trying to get pregnant -breast-feeding How should I use this medicine? This drug is given as an infusion into a vein. It is administered in a hospital or clinic by a specially trained health care professional. Talk to your pediatrician regarding the use of this medicine in children. Special care may be needed. Overdosage: If you think you have taken too much of this medicine contact a poison control center or emergency room at once. NOTE: This medicine is only for you. Do not share this medicine with others. What if I miss a dose? It is important not to miss your dose. Call your doctor or health care professional if you are unable to keep an appointment. What may interact with this medicine? Do not take this medicine with any of the following medications: -disulfiram -metronidazole This medicine may also interact with the following medications: -cyclosporine -dexamethasone -diazepam -ketoconazole -medicines to increase blood counts like filgrastim, pegfilgrastim, sargramostim -other chemotherapy drugs like cisplatin, doxorubicin, epirubicin, etoposide, teniposide, vincristine -quinidine -testosterone -vaccines -  verapamil Talk to your doctor or health care professional before taking any of these medicines: -acetaminophen -aspirin -ibuprofen -ketoprofen -naproxen This list may not describe  all possible interactions. Give your health care provider a list of all the medicines, herbs, non-prescription drugs, or dietary supplements you use. Also tell them if you smoke, drink alcohol, or use illegal drugs. Some items may interact with your medicine. What should I watch for while using this medicine? Your condition will be monitored carefully while you are receiving this medicine. You will need important blood work done while you are taking this medicine. This drug may make you feel generally unwell. This is not uncommon, as chemotherapy can affect healthy cells as well as cancer cells. Report any side effects. Continue your course of treatment even though you feel ill unless your doctor tells you to stop. In some cases, you may be given additional medicines to help with side effects. Follow all directions for their use. Call your doctor or health care professional for advice if you get a fever, chills or sore throat, or other symptoms of a cold or flu. Do not treat yourself. This drug decreases your body's ability to fight infections. Try to avoid being around people who are sick. This medicine may increase your risk to bruise or bleed. Call your doctor or health care professional if you notice any unusual bleeding. Be careful brushing and flossing your teeth or using a toothpick because you may get an infection or bleed more easily. If you have any dental work done, tell your dentist you are receiving this medicine. Avoid taking products that contain aspirin, acetaminophen, ibuprofen, naproxen, or ketoprofen unless instructed by your doctor. These medicines may hide a fever. Do not become pregnant while taking this medicine. Women should inform their doctor if they wish to become pregnant or think they might be pregnant. There is a potential for serious side effects to an unborn child. Talk to your health care professional or pharmacist for more information. Do not breast-feed an infant while  taking this medicine. Men are advised not to father a child while receiving this medicine. What side effects may I notice from receiving this medicine? Side effects that you should report to your doctor or health care professional as soon as possible: -allergic reactions like skin rash, itching or hives, swelling of the face, lips, or tongue -low blood counts - This drug may decrease the number of white blood cells, red blood cells and platelets. You may be at increased risk for infections and bleeding. -signs of infection - fever or chills, cough, sore throat, pain or difficulty passing urine -signs of decreased platelets or bleeding - bruising, pinpoint red spots on the skin, black, tarry stools, nosebleeds -signs of decreased red blood cells - unusually weak or tired, fainting spells, lightheadedness -breathing problems -chest pain -high or low blood pressure -mouth sores -nausea and vomiting -pain, swelling, redness or irritation at the injection site -pain, tingling, numbness in the hands or feet -slow or irregular heartbeat -swelling of the ankle, feet, hands Side effects that usually do not require medical attention (report to your doctor or health care professional if they continue or are bothersome): -bone pain -complete hair loss including hair on your head, underarms, pubic hair, eyebrows, and eyelashes -changes in the color of fingernails -diarrhea -loosening of the fingernails -loss of appetite -muscle or joint pain -red flush to skin -sweating This list may not describe all possible side effects. Call your doctor for medical advice   about side effects. You may report side effects to FDA at 1-800-FDA-1088. Where should I keep my medicine? This drug is given in a hospital or clinic and will not be stored at home. NOTE: This sheet is a summary. It may not cover all possible information. If you have questions about this medicine, talk to your doctor, pharmacist, or health care  provider.  2012, [employer]/Gold Standard. (07/09/2008 11:54:26 AM) 

## 2012-11-18 ENCOUNTER — Telehealth: Payer: Self-pay | Admitting: Oncology

## 2012-11-18 NOTE — Telephone Encounter (Signed)
S/W PT IN RE NP APPT 05/02 @ 3:30 W/DR. [name]/ CHEMO EDU 4/24 @ 10.  WELCOME PACKET  MAILED.

## 2012-11-18 NOTE — Telephone Encounter (Signed)
LVOM FOR PT TO RETURN CALL IN RE NP APPT.  °

## 2012-11-21 ENCOUNTER — Telehealth: Payer: Self-pay | Admitting: Oncology

## 2012-11-21 ENCOUNTER — Ambulatory Visit: Payer: Medicare Other | Admitting: Oncology

## 2012-11-21 ENCOUNTER — Other Ambulatory Visit: Payer: Medicare Other | Admitting: Lab

## 2012-11-21 NOTE — Telephone Encounter (Signed)
C/D 11/21/12 for appt. 12/09/12

## 2012-11-22 ENCOUNTER — Ambulatory Visit: Payer: Medicare Other | Admitting: Gynecologic Oncology

## 2012-11-28 ENCOUNTER — Telehealth: Payer: Self-pay | Admitting: *Deleted

## 2012-11-28 NOTE — Telephone Encounter (Signed)
Patient stated that she is in donut hole with medicare for lunesta and nuvigil and has to pay full price.   Is there another lower cost replacement for these meds?  Out of the lunesta and almost out of the nuvigil.  She also wanted to inform doctor that she has be diagnosed with ovarian cancer, state 3-B.

## 2012-11-30 ENCOUNTER — Telehealth: Payer: Self-pay | Admitting: *Deleted

## 2012-11-30 NOTE — Telephone Encounter (Signed)
I would recommend that she discuss medication changes with Dr. Vickey Huger when she is back. Pls explain to pt. thx    sa  Informed patient, she stated understanding.

## 2012-12-01 ENCOUNTER — Ambulatory Visit: Payer: Medicare Other

## 2012-12-01 ENCOUNTER — Other Ambulatory Visit: Payer: Medicare Other

## 2012-12-01 ENCOUNTER — Encounter: Payer: Self-pay | Admitting: *Deleted

## 2012-12-03 ENCOUNTER — Other Ambulatory Visit: Payer: Self-pay | Admitting: Oncology

## 2012-12-03 DIAGNOSIS — C5700 Malignant neoplasm of unspecified fallopian tube: Secondary | ICD-10-CM | POA: Insufficient documentation

## 2012-12-03 DIAGNOSIS — C5702 Malignant neoplasm of left fallopian tube: Secondary | ICD-10-CM

## 2012-12-05 ENCOUNTER — Telehealth: Payer: Self-pay | Admitting: *Deleted

## 2012-12-05 NOTE — Telephone Encounter (Signed)
Left message to call.

## 2012-12-06 ENCOUNTER — Other Ambulatory Visit: Payer: Self-pay | Admitting: Oncology

## 2012-12-06 ENCOUNTER — Other Ambulatory Visit: Payer: Self-pay | Admitting: *Deleted

## 2012-12-06 ENCOUNTER — Telehealth: Payer: Self-pay | Admitting: Oncology

## 2012-12-06 ENCOUNTER — Telehealth: Payer: Self-pay | Admitting: *Deleted

## 2012-12-06 ENCOUNTER — Encounter (HOSPITAL_COMMUNITY): Payer: Self-pay | Admitting: Pharmacy Technician

## 2012-12-06 ENCOUNTER — Other Ambulatory Visit: Payer: Self-pay | Admitting: Radiology

## 2012-12-06 DIAGNOSIS — C561 Malignant neoplasm of right ovary: Secondary | ICD-10-CM

## 2012-12-06 NOTE — Telephone Encounter (Signed)
Notified patient that chemo is scheduled for 12/13/12 @ 0800. Pt to see Dr Darrold Span 12/09/12

## 2012-12-06 NOTE — Telephone Encounter (Signed)
lvm for Tina in IR to schedule appt for pt...left my info and pt info.Marland KitchenMarland Kitchen

## 2012-12-06 NOTE — Telephone Encounter (Signed)
Spoke with patient. She does want to have port placed and is OK for chemo any day. RN scheduled port placement for 12/08/12

## 2012-12-06 NOTE — Telephone Encounter (Signed)
Message copied by Phillis Knack on Tue Dec 06, 2012  8:50 AM ------      Message from: Reece Packer      Created: Sat Dec 03, 2012  5:48 PM       This will be a new patient to me on 12-09-12 and we will need to start chemo very shortly after that. I see in EMR that patient thinks she will need PAC (tho Alleta's note said veins look ok). Please try to talk with her by phone. If she still prefers PAC, please set this up with IR this week or early next. Also find out which day of week patient prefers for treatment and go ahead and schedule first long taxol carbo next week (I try to avoid Thurs treatments if possible since I am not always in office that day).        thanks ------

## 2012-12-08 ENCOUNTER — Other Ambulatory Visit: Payer: Self-pay | Admitting: Oncology

## 2012-12-08 ENCOUNTER — Encounter (HOSPITAL_COMMUNITY): Payer: Self-pay

## 2012-12-08 ENCOUNTER — Ambulatory Visit (HOSPITAL_COMMUNITY)
Admission: RE | Admit: 2012-12-08 | Discharge: 2012-12-08 | Disposition: A | Discharge status: Home / Self Care | Payer: Medicare Other | Source: Ambulatory Visit | Attending: Oncology | Admitting: Oncology

## 2012-12-08 DIAGNOSIS — C569 Malignant neoplasm of unspecified ovary: Secondary | ICD-10-CM | POA: Insufficient documentation

## 2012-12-08 DIAGNOSIS — E785 Hyperlipidemia, unspecified: Secondary | ICD-10-CM | POA: Insufficient documentation

## 2012-12-08 DIAGNOSIS — C561 Malignant neoplasm of right ovary: Secondary | ICD-10-CM

## 2012-12-08 DIAGNOSIS — IMO0001 Reserved for inherently not codable concepts without codable children: Secondary | ICD-10-CM | POA: Insufficient documentation

## 2012-12-08 DIAGNOSIS — K219 Gastro-esophageal reflux disease without esophagitis: Secondary | ICD-10-CM | POA: Insufficient documentation

## 2012-12-08 LAB — CBC WITH DIFFERENTIAL/PLATELET
Basophils Absolute: 0 10*3/uL (ref 0.0–0.1)
Basophils Relative: 0 % (ref 0–1)
Eosinophils Absolute: 0.4 10*3/uL (ref 0.0–0.7)
Eosinophils Relative: 8 % — ABNORMAL HIGH (ref 0–5)
HCT: 41.9 % (ref 36.0–46.0)
Hemoglobin: 13.9 g/dL (ref 12.0–15.0)
Lymphocytes Relative: 38 % (ref 12–46)
Lymphs Abs: 2 10*3/uL (ref 0.7–4.0)
MCH: 28.7 pg (ref 26.0–34.0)
MCHC: 33.2 g/dL (ref 30.0–36.0)
MCV: 86.4 fL (ref 78.0–100.0)
Monocytes Absolute: 0.6 10*3/uL (ref 0.1–1.0)
Monocytes Relative: 11 % (ref 3–12)
Neutro Abs: 2.3 10*3/uL (ref 1.7–7.7)
Neutrophils Relative %: 43 % (ref 43–77)
Platelets: 160 10*3/uL (ref 150–400)
RBC: 4.85 MIL/uL (ref 3.87–5.11)
RDW: 13.2 % (ref 11.5–15.5)
WBC: 5.3 10*3/uL (ref 4.0–10.5)

## 2012-12-08 LAB — PROTIME-INR
INR: 0.95 (ref 0.00–1.49)
Prothrombin Time: 12.6 seconds (ref 11.6–15.2)

## 2012-12-08 MED ORDER — FENTANYL CITRATE 0.05 MG/ML IJ SOLN
INTRAMUSCULAR | Status: AC | PRN
  Administered 2012-12-08: 100 ug via INTRAVENOUS

## 2012-12-08 MED ORDER — ONDANSETRON HCL 4 MG/2ML IJ SOLN
INTRAMUSCULAR | Status: AC
  Filled 2012-12-08: qty 2

## 2012-12-08 MED ORDER — CEFAZOLIN SODIUM 1-5 GM-% IV SOLN
1.0000 g | Freq: Once | INTRAVENOUS | Status: AC
  Administered 2012-12-08: 1 g via INTRAVENOUS
  Filled 2012-12-08: qty 50

## 2012-12-08 MED ORDER — HEPARIN SOD (PORK) LOCK FLUSH 100 UNIT/ML IV SOLN
500.0000 [IU] | Freq: Once | INTRAVENOUS | Status: AC
  Administered 2012-12-08: 500 [IU] via INTRAVENOUS

## 2012-12-08 MED ORDER — SODIUM CHLORIDE 0.9 % IV SOLN: INTRAVENOUS | Status: DC

## 2012-12-08 MED ORDER — MIDAZOLAM HCL 2 MG/2ML IJ SOLN
INTRAMUSCULAR | Status: AC
  Filled 2012-12-08: qty 4

## 2012-12-08 MED ORDER — FENTANYL CITRATE 0.05 MG/ML IJ SOLN
INTRAMUSCULAR | Status: AC
  Filled 2012-12-08: qty 4

## 2012-12-08 MED ORDER — MIDAZOLAM HCL 2 MG/2ML IJ SOLN
INTRAMUSCULAR | Status: AC | PRN
  Administered 2012-12-08 (×2): 1 mg via INTRAVENOUS

## 2012-12-08 MED ORDER — LIDOCAINE HCL 1 % IJ SOLN
INTRAMUSCULAR | Status: AC
  Filled 2012-12-08: qty 20

## 2012-12-08 NOTE — H&P (Signed)
Chief Complaint: "I'm here for a port" Referring Physician:[name] HPI: Kristin Webb is an 67 y.o. female with ovarian cancer who is to receive chemotherapy. She is scheduled today with IR for port placement. PMHx and meds reviewed. She otherwise feels well, no recent fevers or illness.  Past Medical History:  Past Medical History  Diagnosis Date  . Pelvic mass in female   . Constipation   . Diarrhea     in the past after gallbladder removal  . Hyperlipidemia   . Burning mouth syndrome   . Chronic fatigue   . Fibromyalgia   . Lumbar disc disease   . PONV (postoperative nausea and vomiting)   . Hypertension     borderline not on meds   . Shortness of breath     with exertion   . Sleep apnea     CPAP settings at 12   . Pneumonia     hx of pneumonia   . GERD (gastroesophageal reflux disease)   . Yeast infection     Past Surgical History:  Past Surgical History  Procedure Laterality Date  . Cholecystectomy      early 77s  . Abdominal hysterectomy      early 1990s  . Trigger finger release    . Laparotomy Bilateral 11/08/2012    Procedure: EXPLORATORY LAPAROTOMY TOTAL ABDOMINAL HYSTERECTOMY BILATERAL SALPINGO OOPHORECTOMY TUMOR DEBULKING ;  Surgeon: Rejeana Brock A. Duard Brady, MD;  Location: [facility];  Service: Gynecology;  Laterality: Bilateral;  APPENDECTOMY / OMENTECTOMY  . Appendectomy    . Exploratory laparotomy  11/08/12    BSO, appendectomy, omentectomy    Family History:  Family History  Problem Relation Age of Onset  . Lung cancer Mother   . Lung cancer Father     Social History:  reports that she has never smoked. She has never used smokeless tobacco. She reports that she does not drink alcohol or use illicit drugs.  Allergies:  Allergies  Allergen Reactions  . Bee Venom Hives    Difficulty breathing, carries an EPI-pen  . Cortisone     Injected cortisone, "sick to my stomach, throwing up, hand swelling, and pain."    Medications: diphenhydrAMINE (BENADRYL)  25 mg capsule (Taking) Sig - Route: Take 50-75 mg by mouth at bedtime as needed for sleep. - Oral Class: Historical Med Number of times this order has been changed since signing: 2 Order [address] EPINEPHrine (EPIPEN 2-PAK) 0.3 mg/0.3 mL DEVI (Taking) Sig - Route: Inject 0.3 mg into the muscle once. For bee stings - Intramuscular Class: Historical Med Number of times this order has been changed since signing: 1 Order [address] FLUoxetine (PROZAC) 10 MG capsule (Taking) Sig - Route: Take 30 mg by mouth every evening. - Oral Class: Historical Med OVER THE COUNTER MEDICATION (Taking) Sig: Nystatin topical powder  100,000 units/ GM  Patient uses as needed Class: Historical Med Number of times this order has been changed since signing: 2 Order [address] Armodafinil (NUVIGIL) 250 MG tablet 30 tablet 5 11/15/2012 Sig: One half tablet po in the morning and an additional one half tablet 2 hours later Class: Print Comment: Pharmacy Fax [phone] Number of times this order has been changed since signing: 1 Order [address] Biotin 1 MG CAPS Sig - Route: Take 1 mg by mouth daily. - Oral Class: Historical Med Calcium Citrate-Vitamin D (CALCIUM CITRATE + D PO) Sig - Route: Take 1 capsule by mouth daily. - Oral Class: Historical Med Number of times this order  has been changed since signing: 3 Order [address] Cholecalciferol (VITAMIN D3) 5000 UNITS CAPS Sig - Route: Take 5,000 mg by mouth daily. - Oral Class: Historical Med Number of times this order has been changed since signing: 2 Order [address] Eszopiclone 3 MG TABS Sig - Route: Take 3 mg by mouth at bedtime. Take immediately before bedtime - Oral Class: Historical Med Number of times this order has been changed since signing: 1 Order [address] fish oil-omega-3 fatty acids 1000 MG capsule Sig - Route: Take 1 g by mouth 2 (two) times daily. - Oral Class: Historical Med Number of times this order has been changed since signing: 1 Order [address]  glucosamine-chondroitin 500-400 MG tablet Sig - Route: Take 1 tablet by mouth 2 (two) times daily. - Oral Class: Historical Med Number of times this order has been changed since signing: 1 Order [address] Melatonin 10 MG TABS Sig - Route: Take 10 mg by mouth at bedtime. - Oral Class: Historical Med Multiple Vitamin (MULTIVITAMIN WITH MINERALS   Please HPI for pertinent positives, otherwise complete 10 system ROS negative.  Physical Exam:  Temp: 99, HR: 81, BP: 162/93, RR: 16 O2: 100%   General Appearance:  Alert, cooperative, no distress, appears stated age  Head:  Normocephalic, without obvious abnormality, atraumatic  ENT: Unremarkable  Neck: Supple, symmetrical, trachea midline  Lungs:   Clear to auscultation bilaterally, no w/r/r, respirations unlabored without use of accessory muscles.  Chest Wall:  No tenderness or deformity  Heart:  Regular rate and rhythm, S1, S2 normal, no murmur, rub or gallop.Marland Kitchen  Neurologic: Normal affect, no gross deficits.   CBC    Component Value Date/Time   WBC 5.3 12/08/2012 1215   RBC 4.85 12/08/2012 1215   HGB 13.9 12/08/2012 1215   HCT 41.9 12/08/2012 1215   PLT 160 12/08/2012 1215   MCV 86.4 12/08/2012 1215   MCH 28.7 12/08/2012 1215   MCHC 33.2 12/08/2012 1215   RDW 13.2 12/08/2012 1215   LYMPHSABS 2.0 12/08/2012 1215   MONOABS 0.6 12/08/2012 1215   EOSABS 0.4 12/08/2012 1215   BASOSABS 0.0 12/08/2012 1215    Prothrombin Time/INR 12.6/0.95  Assessment/Plan Ovarian cancer Discussed portacath procedure, risks, complications, use of sedation. Labs reviewed, ok Consent signed in chart  Brayton El PA-C 12/08/2012, 1:01 PM

## 2012-12-08 NOTE — Procedures (Signed)
Placement of right IJ port.  Tip in lower SVC and ready to use.  No immediate complication. 

## 2012-12-09 ENCOUNTER — Other Ambulatory Visit (HOSPITAL_BASED_OUTPATIENT_CLINIC_OR_DEPARTMENT_OTHER): Payer: Medicare Other | Admitting: Lab

## 2012-12-09 ENCOUNTER — Encounter: Payer: Self-pay | Admitting: Oncology

## 2012-12-09 ENCOUNTER — Ambulatory Visit (HOSPITAL_BASED_OUTPATIENT_CLINIC_OR_DEPARTMENT_OTHER): Payer: Medicare Other | Admitting: Oncology

## 2012-12-09 ENCOUNTER — Telehealth: Payer: Self-pay | Admitting: Gynecologic Oncology

## 2012-12-09 ENCOUNTER — Other Ambulatory Visit: Payer: Medicare Other | Admitting: Lab

## 2012-12-09 ENCOUNTER — Other Ambulatory Visit: Payer: Self-pay | Admitting: *Deleted

## 2012-12-09 ENCOUNTER — Ambulatory Visit: Payer: Medicare Other | Admitting: Oncology

## 2012-12-09 ENCOUNTER — Ambulatory Visit (HOSPITAL_COMMUNITY)
Admission: RE | Admit: 2012-12-09 | Discharge: 2012-12-09 | Disposition: A | Discharge status: Home / Self Care | Payer: Medicare Other | Source: Ambulatory Visit | Attending: Oncology | Admitting: Oncology

## 2012-12-09 VITALS — BP 154/92 | HR 120 | Temp 98.6°F | Resp 18 | Ht 59.25 in | Wt 175.7 lb

## 2012-12-09 DIAGNOSIS — C5702 Malignant neoplasm of left fallopian tube: Secondary | ICD-10-CM

## 2012-12-09 DIAGNOSIS — R143 Flatulence: Secondary | ICD-10-CM | POA: Insufficient documentation

## 2012-12-09 DIAGNOSIS — R109 Unspecified abdominal pain: Secondary | ICD-10-CM | POA: Insufficient documentation

## 2012-12-09 DIAGNOSIS — C5700 Malignant neoplasm of unspecified fallopian tube: Secondary | ICD-10-CM | POA: Insufficient documentation

## 2012-12-09 DIAGNOSIS — R141 Gas pain: Secondary | ICD-10-CM | POA: Insufficient documentation

## 2012-12-09 DIAGNOSIS — R142 Eructation: Secondary | ICD-10-CM | POA: Insufficient documentation

## 2012-12-09 DIAGNOSIS — C569 Malignant neoplasm of unspecified ovary: Secondary | ICD-10-CM

## 2012-12-09 LAB — COMPREHENSIVE METABOLIC PANEL (CC13)
ALT: 18 U/L (ref 0–55)
AST: 14 U/L (ref 5–34)
Albumin: 3.8 g/dL (ref 3.5–5.0)
Alkaline Phosphatase: 85 U/L (ref 40–150)
BUN: 11.9 mg/dL (ref 7.0–26.0)
CO2: 24 mEq/L (ref 22–29)
Calcium: 9.1 mg/dL (ref 8.4–10.4)
Chloride: 105 mEq/L (ref 98–107)
Creatinine: 0.9 mg/dL (ref 0.6–1.1)
Glucose: 231 mg/dl — ABNORMAL HIGH (ref 70–99)
Potassium: 3.2 mEq/L — ABNORMAL LOW (ref 3.5–5.1)
Sodium: 143 mEq/L (ref 136–145)
Total Bilirubin: 0.48 mg/dL (ref 0.20–1.20)
Total Protein: 7.2 g/dL (ref 6.4–8.3)

## 2012-12-09 MED ORDER — DEXAMETHASONE 4 MG PO TABS: ORAL_TABLET | ORAL | Status: DC

## 2012-12-09 MED ORDER — LIDOCAINE-PRILOCAINE 2.5-2.5 % EX CREA: TOPICAL_CREAM | CUTANEOUS | Status: DC | PRN

## 2012-12-09 MED ORDER — ONDANSETRON HCL 8 MG PO TABS: ORAL_TABLET | ORAL | Status: DC

## 2012-12-09 MED ORDER — PROCHLORPERAZINE MALEATE 10 MG PO TABS: ORAL_TABLET | ORAL | Status: DC

## 2012-12-09 NOTE — Progress Notes (Signed)
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    HISTORY OF PRESENT ILLNESS:Kristin Webb is a 67 y.o. female who is alone for consultation at the request of Dr Cleda Mccreedy, for systemic chemotherapy adjuvantly for IIIB serous fallopian carcinoma.  Patient had presented to PCP in ~ Jan 2014 with complaints of abdominal distension and change in bowels. She had had unremarkable colonoscopy July 2013 (not in this EMR, and patient does not recall which MD). She is post hysterectomy in 1990s. Abdominal symptoms did not improve with interventions for constipation, and CT AP was done in [place] system 11-01-12 with finding of large mid abdominal mass. She was referred to Dr Duard Brady, with mass high out of pelvis on her exam 11-02-12. Preop CA 125 is not available in this EMR. Preop CXR had some linear scarring in lingula without acute findings. She went to exploratory laparotomy, bilateral salpingo-oophorectomy, appendectomy, and infacolic omentectomy, which was optimal debulking, by Dr Duard Brady on 11-08-2012. Operative findings: 25 cm right adnexal mass with smooth surface. Surgically absent uterus. Atrophic-appearing left ovary. Normal appearing appendix. Within the omentum there were centimeter nodules scattered throughout. The remainder of the surfaces were benign Her post operative course was unremarkable and she did well after discharge on 11-12-12, on lovenox which she has now completed. She saw Dr Duard Brady in follow up on 11-17-12, with recommendation for 6 cycles of taxol/ carboplatin. She had PAC placed by IR on 12-08-12. She has attended  chemotherapy education class prior to this visit, and is scheduled to have cycle 1 q 3 week taxol/carboplatin at [facility] on 12-13-12.  Marland Kitchen  REVIEW OF SYSTEMS:  Weight loss of ~ 16 lbs thru surgery, with weight stable and appetite/ po intake good now. Bowels typically move 5-6x daily as her normal, which is again the case now tho stools vary from soft to very firm. She notices more abdominal distension in past couple of weeks, not as much as prior to surgery. She has some low back discomfort improved with heating pad. Small amount of blood ?vaginal vs urinary tract on pads in am. No nausea or vomiting. No GERD.No bladder symptoms, no fever. Some SOB with exertion such as walking uphill, none walking on level ground. No cough, no chest pain. Otherwise, no migraines, occasional sinus HA and sinusitis. Usual slight stress urinary incontinence. Good visual acuity with glasses. 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 Chronic dry mouth, this and dental pain improved with chewing gum. Numbness in feet bilaterally when she wakens in AM and with some positions, resolves with activity. Slight dizziness x years when lying supine. No arthritis. Hx fibromyalgia for which she has seen rheumatology. Generally severe nausea after anesthesia, better after recent surgery with scopalamine  patch in hospital (and low dose zofran). 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  ALLERGIES: Bee venom and Cortisone Cortisone problem was cortisone injection after surgery for trigger finger, after which she had pain and swelling in the  hand followed by several days of flu symptoms -- not clear to me that this was actually a problem with the cortisone.  PAST MEDICAL HISTORY:  has a past  medical history of Pelvic mass in female; Constipation; Diarrhea; Hyperlipidemia; Burning mouth syndrome; Chronic fatigue; Fibromyalgia; Lumbar disc disease; PONV (postoperative nausea and vomiting); Hypertension; Shortness of breath; Sleep apnea; Pneumonia; GERD (gastroesophageal reflux disease); and Yeast infection.   G2P2 Cholecystectomy 1990s Abdominal hysterectomy by Dr Myrlene Broker 1990s Trigger finger release (Sypher) Borderline HTN "white coat syndrome" with normal BPs at home Elevated lipids Fibromyalgia Lumbar disc disease Sleep apnea for which she uses CPAP   CURRENT MEDICATIONS: reviewed in EMR. She uses [facility]. We will send prescriptions for EMLA, decadron QS first cycle only, zofran 8-16 mg q 12 hrs prn and compazine (this chosen in preference to phenergan or ativan due to hx of excessive sedation with medications)   SOCIAL HISTORY:  reports that she has never smoked. She has never used smokeless tobacco. She reports that she does not drink alcohol or use illicit drugs. Originally from [place], moved to [place] 24 yrs ago "for lifestyle choice". 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 2 biologic children, a son who is deaf and a daughter; they also have 14 adopted children ages 64 - 84s (oldest girl is Education officer, community). Never smoker. No ETOH. No transfusions  FAMILY HISTORY: family history includes Lung cancer in her father and mother. Father was 4 ppd smoker, died of lung cancer age 22. Mother nonsmoker herself, died of lung cancer age 28 No siblings.  LABORATORY DATA:  Results for orders placed in visit on 12/09/12 (from the past 48 hour(s))  COMPREHENSIVE METABOLIC PANEL (CC13)     Status: Abnormal   Collection Time    12/09/12  3:44 PM      Result Value Range   Sodium 143  136 - 145 mEq/L   Potassium 3.2 (*) 3.5 - 5.1 mEq/L   Chloride 105  98 - 107 mEq/L   CO2 24  22 - 29 mEq/L   Glucose 231 (*) 70 - 99 mg/dl    BUN 04.5  7.0 - 40.9 mg/dL   Creatinine 0.9  0.6 - 1.1 mg/dL   Total Bilirubin 8.11  0.20 - 1.20 mg/dL   Alkaline Phosphatase 85  40 - 150 U/L   AST 14  5 - 34 U/L   ALT 18  0 - 55 U/L   Total Protein 7.2  6.4 - 8.3 g/dL   Albumin 3.8  3.5 - 5.0 g/dL   Calcium 9.1  8.4 - 91.4 mg/dL     CBC 02-15-28 with WBC 5.3, ANC 2.3, Hgb 13.9, plt 160k  RADIOGRAPHY: Ir Fluoro Guide Cv Line Right  12/08/2012  *RADIOLOGY REPORT*  Clinical Data: 67 year old with ovarian cancer and starting chemotherapy.  FLUOROSCOPIC AND ULTRASOUND GUIDED PLACEMENT OF A SUBCUTANEOUS PORT.  Physician: Rachelle Hora. Henn, MD  Medications:Versed 2 mg, Fentanyl 100 mcg. A radiology nurse monitored the patient for moderate sedation.  Ancef 1 gm.  As antibiotic prophylaxis, Ancef was ordered pre-procedure and administered intravenously within one hour of incision.  Moderate sedation time:45 minutes  Fluoroscopy time:  12 seconds  Procedure:  The risks of the procedure were explained to the patient.  Informed consent was obtained.  Patient was placed supine on the interventional table.  Ultrasound confirmed a patent right internal jugular vein.  The right chest and neck  were cleaned with a skin antiseptic and a sterile drape was placed.  Maximal barrier sterile technique was utilized including caps, mask, sterile gowns, sterile gloves, sterile drape, hand hygiene and skin antiseptic. The right neck was anesthetized with 1% lidocaine.  Small incision was made in the right neck with a blade.  Micropuncture set was placed in the right IJ with ultrasound guidance.  The micropuncture wire was used for measurement purposes.  The right chest was anesthetized with 1% lidocaine with epinephrine.  #15 blade was used to make an incision and a subcutaneous port pocket was formed. 8 french Power Port was assembled.  Subcutaneous tunnel was formed with a stiff tunneling device.  The port catheter was brought through the subcutaneous tunnel.  The port was placed in  the subcutaneous pocket and sutured in place.  The micropuncture set was exchanged for a peel-away sheath.  The catheter was placed through the peel-away sheath and the tip was positioned in the lower SVC.  Catheter placement was confirmed with fluoroscopy.  The port was accessed and flushed with heparinized saline.  The port pocket was closed using two layers of absorbable sutures and Dermabond.  The vein skin site was closed using a single layer of absorbable suture and Dermabond.  Sterile dressings were applied. Patient tolerated the procedure well without an immediate complication.  Ultrasound and fluoroscopic images were taken and saved for this procedure.  Complications: None  Impression:  Placement of a subcutaneous port device.  The catheter tip is in the lower SVC and ready to be used.   Original Report Authenticated By: Richarda Overlie, M.D.    Ir US Guide Vasc Access Right  12/08/2012  *RADIOLOGY REPORT*  Clinical Data: 67 year old with ovarian cancer and starting chemotherapy.  FLUOROSCOPIC AND ULTRASOUND GUIDED PLACEMENT OF A SUBCUTANEOUS PORT.  Physician: Rachelle Hora. Henn, MD  Medications:Versed 2 mg, Fentanyl 100 mcg. A radiology nurse monitored the patient for moderate sedation.  Ancef 1 gm.  As antibiotic prophylaxis, Ancef was ordered pre-procedure and administered intravenously within one hour of incision.  Moderate sedation time:45 minutes  Fluoroscopy time:  12 seconds  Procedure:  The risks of the procedure were explained to the patient.  Informed consent was obtained.  Patient was placed supine on the interventional table.  Ultrasound confirmed a patent right internal jugular vein.  The right chest and neck were cleaned with a skin antiseptic and a sterile drape was placed.  Maximal barrier sterile technique was utilized including caps, mask, sterile gowns, sterile gloves, sterile drape, hand hygiene and skin antiseptic. The right neck was anesthetized with 1% lidocaine.  Small incision was made in  the right neck with a blade.  Micropuncture set was placed in the right IJ with ultrasound guidance.  The micropuncture wire was used for measurement purposes.  The right chest was anesthetized with 1% lidocaine with epinephrine.  #15 blade was used to make an incision and a subcutaneous port pocket was formed. 8 french Power Port was assembled.  Subcutaneous tunnel was formed with a stiff tunneling device.  The port catheter was brought through the subcutaneous tunnel.  The port was placed in the subcutaneous pocket and sutured in place.  The micropuncture set was exchanged for a peel-away sheath.  The catheter was placed through the peel-away sheath and the tip was positioned in the lower SVC.  Catheter placement was confirmed with fluoroscopy.  The port was accessed and flushed with heparinized saline.  The port pocket was closed using two  layers of absorbable sutures and Dermabond.  The vein skin site was closed using a single layer of absorbable suture and Dermabond.  Sterile dressings were applied. Patient tolerated the procedure well without an immediate complication.  Ultrasound and fluoroscopic images were taken and saved for this procedure.  Complications: None  Impression:  Placement of a subcutaneous port device.  The catheter tip is in the lower SVC and ready to be used.   Original Report Authenticated By: Richarda Overlie, M.D.     CT ABDOMEN AND PELVIS WITH CONTRAST 11-01-12 Technique: Multidetector CT imaging of the abdomen and pelvis was  performed following the standard protocol during bolus  administration of intravenous contrast.  Contrast: OMNIPAQUE IOHEXOL 300 MG/ML SOLN  Comparison: Lumbar MRI 05/16/2009.  Findings: The lung bases are clear. There is no significant  pleural effusion.  There is no significant biliary dilatation status post  cholecystectomy. No suspicious liver lesions are identified.  There is focal fat in the left hepatic lobe adjacent to the  falciform ligament  (image 21). The spleen, adrenal glands and  pancreas appear normal.  There is a 1.2 cm calculus in the interpolar region of the left  kidney on image 44. Both renal pelves are mildly dilated without  evidence of obstruction on the delayed images. 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  There has been interval development of a very large mid abdominal  mass which extends slightly off midline towards the right. This  measures 17.0 x 22.7 cm transverse and 20.0 cm cephalocaudad. This  lesion is well-circumscribed and without calcifications. There are  irregular thickened septations and areas of solid nodularity  consistent with malignancy. There is no associated bowel  obstruction. This appears separate from the appendix. Appearance  is most concerning for ovarian malignancy, likely arising from the  right ovary. There is probable normal left ovarian tissue on image  74. The uterus is surgically absent. The bladder appears normal.  There is some soft tissue stranding in the base of the mesentery  superior to the mass and inferior to the pancreatic tail. There is  no ascites. However, there is some omental nodularity, most  notably on the axial image 55. No pathologically enlarged lymph  nodes or suspicious osseous findings are demonstrated.  IMPRESSION:  1. Interval development of large mid abdominal mass highly  concerning for right ovarian cancer. There is mild omental  nodularity on the left, and peritoneal disease cannot be completely  excluded. There is no ascites or other evidence of metastatic  disease.  2. Mild associated renal pelvocaliectasis bilaterally without  obstruction. Nonobstructing left renal calculus and a small right  renal angiomyolipoma noted incidentally.    ABDOMEN - 1 VIEW 12-09-2012 obtained after visit due to complaint of abdominal distension Comparison: 11/11/2012  Findings: Nonobstructive bowel gas pattern.  Mild colonic stool  burden in the left colon.  Cholecystectomy clips.  Visualized osseous structures are within normal limits.  IMPRESSION:  Unremarkable abdominal radiograph.    PATHOLOGY     Accession #: [id number]  Collected Date: 11/08/2012 REPORT OF SURGICAL PATHOLOGYINAL DIAGNOSIS Diagnosis 1. 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 - BENIGN FALLOPIAN TUBE WITH NONSPECIFIC CHRONIC INFLAMMATION. 2. Ovary and fallopian tube, left - BENIGN OVARY; NEGATIVE FOR ATYPIA OR MALIGNANCY. - BENIGN FALLOPIAN TUBE; NEGATIVE FOR ATYPIA OR  MALIGNANCY. 3. Omentum, resection for tumor - HIGH GRADE CARCINOMA, SEE COMMENT. 4. Appendix, Other than Incidental - FIBROUS OBLITERATION OF APPENDICEAL TIP. - NEGATIVE FOR MALIGNANCY. Microscopic Comment 1. Numerous sections of the ovarian cystic mass were submitted for review. On review the preponderance of the cyst consists of banal columnar mucinous type epithelium. However, there are numerous foci demonstrating nuclear pseudostratification within intestinal morphology associated with areas of true cribriform architecture. There is no definitive features of stromal invasion identified. The separate 1.5 cm nodular area identified within the fimbria of the fallopian tube demonstrates high grade carcinoma with the following immunophenotype: Cytokeratin AE1/AE3 - strong diffuse expression WT1- strong diffuse expression p53 - strong diffuse expression p16 - strong diffuse expression EMA - strond diffuse expression CD30 - negative expression AFP - negative expression PLAP - patchy strong expression ) Microscopic Comment(continued) Overall, the morphology and immunophenotype are that of high grade carcinoma, serous type. Given the absence of a defined precursor intraepithelial lesion in sections of the  corresponding fallopian tube, a formal TNM stage cannot be preformed. However, if this is clinically considered to represent a primary tumor site, tumor staging can be preformed upon request. Finally, the morphology is identical to the omental nodule/carcinoma identified in part 3. The case was reviewed with Dr. Laureen Ochs who concurs. The case was discussed with Dr. Duard Brady on 11/11/2012. 3. There are multiple nodules of tumor identified within the submitted omental tissue. The morphology is identical to the 1.5 cm mass identified in part 1.  Intraoperative Diagnosis 1. RIGHT TUBE AND OVARY, FROZEN SECTION DIAGNOSIS: MUCINOUS CYSTIC NEOPLASM, AT LEAST A BORDERLINE MALIGNANCY. (BNS) ---Specimen Gross and Clinical Information 4. Specimen: Appendix. Size: 8 cm in length x up to 0.6 cm in diameter. Serosa: Smooth and tan. Mucosa: Unremarkable. Wall: Intact. Lumen: Patent.       PHYSICAL EXAM:  height is 4' 11.25" (1.505 m) and weight is 175 lb 11.2 oz (79.697 kg). Her oral temperature is 98.6 F (37 C). Her blood pressure is 154/92 and her pulse is 120. Her respiration is 18.  Pleasant lady, looks stated age, excellent historian, NAD. Ambulatory without assistance, a little dyspneic walking briskly to radiology from [facility] after visit. Obese. HEENT: normal hair pattern. PERRL, not icteric. Oral mucosa moist without lesions, posterior pharynx clear. Neck supple without thyroid mass, JVD. Lymphatics: no cervical, supraclavicular, axilary or inguinal adenopathy Lungs clear to A & P  Spine not tender to palpation. Mild kyphosis Heart RRR no gallop Breasts bilaterally without masses, skin or nipple findings Abdomen soft, not clearly distended without any comparison exam, few bowel sounds, soft, no apparent fluid wave. No appreciable HSM. Midline incision well healed, no tenderness.  LE no edema, cords, tenderness Skin without rash or ecchymosis PAC right anterior chest wall with slight skin  irritation at edges of occlusive dressing, which was removed now. No significant ecchymosis surrounding. Neurologic: no focal deficits including sensory changes now in feet.      We have discussed all of history above and the recommendation for adjuvant chemotherapy with standard dosing of taxol and carboplatin. We have discussed neuropathy concerns with taxol, and I have mentioned that the weekly dose dense regimen could be a consideration if side effects are difficult with the every 3 week schedule. We have discussed premedication steroids and the other medications being prescribed; she has had written and oral instructions given for these. She understands that she can contact our office at any time if questions or concerns. We have discussed usual  treatment course including follow up at this office. She has had all questions answered to her satisfaction and is in agreement with plan to begin chemotherapy on 12-13-12.    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 I will see her 1 and 2 weeks after first treatment, with follow up counts and chemistries/ CA 125. 2.PAC in 3.post hysterectomy 1990s 4.preexisting neuropathy symptoms intermittent in feet and also teeth. Will need to watch closely on the taxol 5.obesity 6.sleep apnea for which she uses CPAP, and hx narcolepsy 7.normal colonoscopy July 2013 by report 8.unremarkable mammograms other than heterogeneously dense tissue, at [facility] 02-18-2012. 3D/ tomo mammograms would be appropriate due to dense tissue with next exam. 9.lumbar disc disease, chronic 10. Fibromyalgia 11.post cholecystectomy    [name],LENNIS P, MD 12/09/2012 5:31 PM

## 2012-12-09 NOTE — Telephone Encounter (Signed)
Returning call to patient about question with filling out insurance documents.  Family member stating that she "was on her way to the hospital."  No concerns voiced.

## 2012-12-09 NOTE — Patient Instructions (Signed)
First chemotherapy will be May 6 at 8AM.  You need to take steroid decadron (dexamethasone) five of the 4 mg tablets (=20 mg) with food 12 hours and 6 hours prior to chemo, so eat a little and take five tablets at 8 PM on May 5, and eat a little and take five more tablets at 2 AM on May 6.  (approximate times are ok).  Night of May 6 whether or not any nausea, take compazine (prochlorperazine). AM after chemo, whether or not any nausea, take zofran (ondansetron)   We will send prescriptions to [facility] for EMLA / ellamax numbing cream to put onto portacath ~ 30 - 60 min before access to numb it Decadron (dexamethasone, steroid)   4 mg tablets, enough for first treatment only zofran (ondansetron) 8 mg   1-2 every 12 hours as needed for nausea. Will not make you drowsy Compazine (prochlorperazine) 10 mg   1 every 6 hours as needed for nausea. May make you a little drowsy  Call any time if questions or problems    [phone]

## 2012-12-10 ENCOUNTER — Telehealth: Payer: Self-pay | Admitting: Oncology

## 2012-12-10 LAB — CA 125: CA 125: 17.7 U/mL (ref 0.0–30.2)

## 2012-12-10 NOTE — Telephone Encounter (Signed)
Medical Oncology  Report and PACS images of abdominal xray done 12-09-12 reviewed, with significant constipation accounting for abdominal fullness since surgery. Spoke with patient by phone, recommending either miralax or senokotS today and tomorrow, to try to move bowels well prior to first chemo next week. She told me that she spoke with pharmacist and Dr Clelia Croft on call today re premed steroid, due to local reaction with swelling and pain after cortisone injection following trigger finger surgery, then a few days of "flu symptoms". I told her that I agree with Dr Clelia Croft that decadron premed for taxol would not cause allergic reaction, as steroids are best intervention for allergic problems. Patient wrote down instructions and appreciated call.  Ila Mcgill, MD

## 2012-12-12 ENCOUNTER — Telehealth: Payer: Self-pay | Admitting: Gynecologic Oncology

## 2012-12-12 ENCOUNTER — Encounter: Payer: Self-pay | Admitting: Oncology

## 2012-12-12 ENCOUNTER — Telehealth: Payer: Self-pay | Admitting: Oncology

## 2012-12-12 ENCOUNTER — Encounter: Payer: Self-pay | Admitting: *Deleted

## 2012-12-12 ENCOUNTER — Ambulatory Visit: Payer: Medicare Other

## 2012-12-12 ENCOUNTER — Other Ambulatory Visit: Payer: Self-pay | Admitting: Oncology

## 2012-12-12 NOTE — Progress Notes (Signed)
RECEIVED A FAX FROM [facility] CONCERNING A PRIOR AUTHORIZATION FOR ONDANSETRON. THIS REQUEST WAS PLACED IN THE MANAGED CARE BIN. 

## 2012-12-12 NOTE — Telephone Encounter (Signed)
Message left about follow up appt scheduled for Dec 20, 2012 with Dr. Duard Brady at 3:30pm for a post-op check up.  Instructed to call the office for any questions or concerns.

## 2012-12-12 NOTE — Telephone Encounter (Signed)
lvm for pt regarding to 5.6 and 5.14 appt...also advised pt to pick up new sched at nxt visit

## 2012-12-12 NOTE — Progress Notes (Signed)
Faxed ondansetron pa for to [facility].

## 2012-12-13 ENCOUNTER — Ambulatory Visit (HOSPITAL_BASED_OUTPATIENT_CLINIC_OR_DEPARTMENT_OTHER): Payer: Medicare Other

## 2012-12-13 ENCOUNTER — Other Ambulatory Visit: Payer: Self-pay | Admitting: Oncology

## 2012-12-13 ENCOUNTER — Other Ambulatory Visit (HOSPITAL_BASED_OUTPATIENT_CLINIC_OR_DEPARTMENT_OTHER): Payer: Medicare Other | Admitting: Lab

## 2012-12-13 ENCOUNTER — Ambulatory Visit (HOSPITAL_BASED_OUTPATIENT_CLINIC_OR_DEPARTMENT_OTHER): Payer: Medicare Other | Admitting: Oncology

## 2012-12-13 ENCOUNTER — Other Ambulatory Visit: Payer: Self-pay | Admitting: Medical Oncology

## 2012-12-13 ENCOUNTER — Other Ambulatory Visit: Payer: Self-pay

## 2012-12-13 VITALS — BP 165/92 | HR 84 | Temp 98.2°F | Resp 18

## 2012-12-13 DIAGNOSIS — C5700 Malignant neoplasm of unspecified fallopian tube: Secondary | ICD-10-CM

## 2012-12-13 DIAGNOSIS — C569 Malignant neoplasm of unspecified ovary: Secondary | ICD-10-CM

## 2012-12-13 DIAGNOSIS — R739 Hyperglycemia, unspecified: Secondary | ICD-10-CM

## 2012-12-13 DIAGNOSIS — C5702 Malignant neoplasm of left fallopian tube: Secondary | ICD-10-CM

## 2012-12-13 DIAGNOSIS — Z5111 Encounter for antineoplastic chemotherapy: Secondary | ICD-10-CM

## 2012-12-13 DIAGNOSIS — R7309 Other abnormal glucose: Secondary | ICD-10-CM

## 2012-12-13 LAB — WHOLE BLOOD GLUCOSE
Glucose: 236 mg/dL
HRS PC: 4 Hours

## 2012-12-13 MED ORDER — SODIUM CHLORIDE 0.9 % IV SOLN
135.0000 mg/m2 | Freq: Once | INTRAVENOUS | Status: AC
  Administered 2012-12-13: 246 mg via INTRAVENOUS
  Filled 2012-12-13: qty 41

## 2012-12-13 MED ORDER — ONDANSETRON 16 MG/50ML IVPB (CHCC)
16.0000 mg | Freq: Once | INTRAVENOUS | Status: AC
  Administered 2012-12-13: 16 mg via INTRAVENOUS

## 2012-12-13 MED ORDER — LORAZEPAM 2 MG/ML IJ SOLN
0.5000 mg | Freq: Once | INTRAMUSCULAR | Status: AC
  Administered 2012-12-13: 0.5 mg via INTRAVENOUS

## 2012-12-13 MED ORDER — DEXAMETHASONE SODIUM PHOSPHATE 20 MG/5ML IJ SOLN
20.0000 mg | Freq: Once | INTRAMUSCULAR | Status: AC
  Administered 2012-12-13: 20 mg via INTRAVENOUS

## 2012-12-13 MED ORDER — SODIUM CHLORIDE 0.9 % IV SOLN
Freq: Once | INTRAVENOUS | Status: AC
  Administered 2012-12-13: 08:00:00 via INTRAVENOUS

## 2012-12-13 MED ORDER — HEPARIN SOD (PORK) LOCK FLUSH 100 UNIT/ML IV SOLN
500.0000 [IU] | Freq: Once | INTRAVENOUS | Status: AC | PRN
  Administered 2012-12-13: 500 [IU]
  Filled 2012-12-13: qty 5

## 2012-12-13 MED ORDER — SODIUM CHLORIDE 0.9 % IJ SOLN
10.0000 mL | INTRAMUSCULAR | Status: DC | PRN
  Administered 2012-12-13: 10 mL
  Filled 2012-12-13: qty 10

## 2012-12-13 MED ORDER — METHYLPREDNISOLONE SODIUM SUCC 125 MG IJ SOLR
125.0000 mg | Freq: Once | INTRAMUSCULAR | Status: AC | PRN
  Administered 2012-12-13: 125 mg via INTRAVENOUS

## 2012-12-13 MED ORDER — FAMOTIDINE IN NACL 20-0.9 MG/50ML-% IV SOLN
20.0000 mg | Freq: Once | INTRAVENOUS | Status: AC
  Administered 2012-12-13: 20 mg via INTRAVENOUS

## 2012-12-13 MED ORDER — DIPHENHYDRAMINE HCL 50 MG/ML IJ SOLN
50.0000 mg | Freq: Once | INTRAMUSCULAR | Status: AC
  Administered 2012-12-13: 50 mg via INTRAVENOUS

## 2012-12-13 MED ORDER — DIPHENHYDRAMINE HCL 50 MG/ML IJ SOLN
50.0000 mg | Freq: Once | INTRAMUSCULAR | Status: AC | PRN
  Administered 2012-12-13: 25 mg via INTRAVENOUS

## 2012-12-13 MED ORDER — SODIUM CHLORIDE 0.9 % IV SOLN
517.0000 mg | Freq: Once | INTRAVENOUS | Status: AC
  Administered 2012-12-13: 520 mg via INTRAVENOUS
  Filled 2012-12-13: qty 52

## 2012-12-13 MED ORDER — PACLITAXEL CHEMO INJECTION 300 MG/50ML: 135.0000 mg/m2 | Freq: Once | INTRAVENOUS | Status: DC

## 2012-12-13 NOTE — Patient Instructions (Addendum)
[facility] Discharge Instructions for Patients Receiving Chemotherapy  Today you received the following chemotherapy agents Taxol and Carboplatin.  To help prevent nausea and vomiting after your treatment, we encourage you to take your nausea medication as prescribed.    If you develop nausea and vomiting that is not controlled by your nausea medication, call the clinic. If it is after clinic hours your family physician or the after hours number for the clinic or go to the Emergency Department.   BELOW ARE SYMPTOMS THAT SHOULD BE REPORTED IMMEDIATELY:  *FEVER GREATER THAN 100.5 F  *CHILLS WITH OR WITHOUT FEVER  NAUSEA AND VOMITING THAT IS NOT CONTROLLED WITH YOUR NAUSEA MEDICATION  *UNUSUAL SHORTNESS OF BREATH  *UNUSUAL BRUISING OR BLEEDING  TENDERNESS IN MOUTH AND THROAT WITH OR WITHOUT PRESENCE OF ULCERS  *URINARY PROBLEMS  *BOWEL PROBLEMS  UNUSUAL RASH   One of the nurses will contact you 24 hours after your treatment. Please let the nurse know about any problems that you may have experienced. Feel free to call the clinic you have any questions or concerns. The clinic phone number is [phone].   I have been informed and understand all the instructions given to me. I know to contact the clinic, my physician, or go to the Emergency Department if any problems should occur. I do not have any questions at this time, but understand that I may call the clinic during office hours   should I have any questions or need assistance in obtaining follow up care.    __________________________________________  _____________  __________ Signature of Patient or Authorized Representative            Date                   Time    __________________________________________ Nurse's Signature

## 2012-12-13 NOTE — Progress Notes (Signed)
Late Entry: 10:15 am.  Patient complained of eyes tearing and being red. First time Taxol stopped. Normal Saline started. Solu-Medrol 125 mg administered. VSS. (See Doc Flowsheets). MD notified. Orders received.

## 2012-12-13 NOTE — Progress Notes (Signed)
Reported CBG of 236 to MD.  Per Dr. Darrold Span -instructed patient to increase intake of non caffeinated fluids.

## 2012-12-13 NOTE — Progress Notes (Signed)
OFFICE PROGRESS NOTE   12/13/2012   Physicians:Gehrig, Wendee Beavers, MD (PCP), Dohmeier, Porfirio Mylar; (Sypher, R), rheumatology (?Dierdre Forth)   INTERVAL HISTORY:   Patient is seen in infusion area, together with husband, for unscheduled visit due to apparent mild taxol reaction, receiving day 1 cycle 1 (q 3 week) taxol carboplatin.   Patient had presented to PCP in ~ Jan 2014 with complaints of abdominal distension and change in bowels. She had had unremarkable colonoscopy July 2013 (not in this EMR, and patient does not recall which MD). She is post hysterectomy in 1990s. Abdominal symptoms did not improve with interventions for constipation, and CT AP was done in [place] system 11-01-12 with finding of large mid abdominal mass. She was referred to Dr Duard Brady, with mass high out of pelvis on her exam 11-02-12. Preop CA 125 is not available in this EMR. Preop CXR had some linear scarring in lingula without acute findings. She went to exploratory laparotomy, bilateral salpingo-oophorectomy, appendectomy, and infacolic omentectomy, which was optimal debulking, by Dr Duard Brady on 11-08-2012. Operative findings: 25 cm right adnexal mass with smooth surface. Surgically absent uterus. Atrophic-appearing left ovary. Normal appearing appendix. Within the omentum there were centimeter nodules scattered throughout. The remainder of the surfaces were benign  Her post operative course was unremarkable and she did well after discharge on 11-12-12, on lovenox which she has now completed. She saw Dr Duard Brady in follow up on 11-17-12, with recommendation for 6 cycles of taxol/ carboplatin. She had PAC placed by IR on 12-08-12.   Somewhat agitated and a little confused with steroids, benadryl, ativan today. No nausea, no SOB, no new or different pain. PAC functioning well. Remainder of 10 point Review of Systems negative.  Objective:  Vital signs in last 24 hours: 163/82, 85 regular, 20 not labored RA, 98.1  Awake,  responds appropriately, NAD in recliner on RA. PERRL, sclerae minimally injected, no tearing, no swelling apparent now. Oral mucosa moist and cleat. Lungs clear, respirations not labored. Abdomen soft, not tender. Heart RRR. PAC site not remarkable. LE no edema, cords, tenderness. Moves easily in recliner.    Lab Results:  Results for orders placed in visit on 12/09/12  CA 125      Result Value Range   CA 125 17.7  0.0 - 30.2 U/mL  COMPREHENSIVE METABOLIC PANEL (CC13)      Result Value Range   Sodium 143  136 - 145 mEq/L   Potassium 3.2 (*) 3.5 - 5.1 mEq/L   Chloride 105  98 - 107 mEq/L   CO2 24  22 - 29 mEq/L   Glucose 231 (*) 70 - 99 mg/dl   BUN 40.9  7.0 - 81.1 mg/dL   Creatinine 0.9  0.6 - 1.1 mg/dL   Total Bilirubin 9.14  0.20 - 1.20 mg/dL   Alkaline Phosphatase 85  40 - 150 U/L   AST 14  5 - 34 U/L   ALT 18  0 - 55 U/L   Total Protein 7.2  6.4 - 8.3 g/dL   Albumin 3.8  3.5 - 5.0 g/dL   Calcium 9.1  8.4 - 78.2 mg/dL     Studies/Results:  No results found.  Medications: I have reviewed the patient's current medications.  Assessment/Plan:  1.Possible mild taxol reaction, resolved. Will continue treatment as planned with close observation. 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

## 2012-12-14 ENCOUNTER — Encounter: Payer: Self-pay | Admitting: Oncology

## 2012-12-14 ENCOUNTER — Telehealth: Payer: Self-pay

## 2012-12-14 ENCOUNTER — Telehealth: Payer: Self-pay | Admitting: *Deleted

## 2012-12-14 DIAGNOSIS — E876 Hypokalemia: Secondary | ICD-10-CM

## 2012-12-14 MED ORDER — POTASSIUM CHLORIDE ER 10 MEQ PO TBCR: 10.0000 meq | EXTENDED_RELEASE_TABLET | Freq: Every day | ORAL | Status: DC

## 2012-12-14 NOTE — Telephone Encounter (Signed)
Message copied by Augusto Garbe on Wed Dec 14, 2012 12:55 PM ------      Message from: Lenn Sink I      Created: Tue Dec 13, 2012  3:49 PM      Regarding: chemo follow up call       First time Taxol and Carboplatin. Did have a reaction. Eyes were red, itching and burning. VSS. Dr. Darrold Span. ------

## 2012-12-14 NOTE — Telephone Encounter (Signed)
Message copied by Lorine Bears on Wed Dec 14, 2012  4:53 PM ------      Message from: Reece Packer      Created: Tue Dec 13, 2012  3:49 PM       Labs seen and need follow up: first chemo today. Please have her start K+ 10 mEq daily, # 14.  We will recheck BMET at next visit. ------

## 2012-12-14 NOTE — Telephone Encounter (Signed)
[name] is doing well.  Denies any signs of reaction or further eye problems that presented with treatment.  Says she feels tired.  Some constipation issues.  Bowels have moved x 2 today but painful.  Has taken two senokot.  Encouraged to drink lots of water, 64 oz minimum.  Reports her senokot reads it is a stool softener.  Asked if she can wear bug repellent with the warm weather.  Plans to wear sunscreen.  Instructed to find one that does not contain DEET.  No further questions.

## 2012-12-14 NOTE — Telephone Encounter (Signed)
Left a message for patient that her KCL was a little low at 3.2 yesterday and will call in KCL supplement to [name] [place] to take daily as noted below by Dr. Darrold Span. Requested that patient call back tomorrow to confirm receipt of this message.

## 2012-12-15 ENCOUNTER — Encounter: Payer: Self-pay | Admitting: Oncology

## 2012-12-15 NOTE — Progress Notes (Signed)
[employer] [place] approved ondansetron 8mg  for 1 year starting 12/13/12.

## 2012-12-16 ENCOUNTER — Telehealth: Payer: Self-pay

## 2012-12-16 NOTE — Telephone Encounter (Signed)
Kristin Webb atated that she received this nurse's phone call about kcl and she picked up prescription and is taking med as directed.

## 2012-12-18 ENCOUNTER — Other Ambulatory Visit: Payer: Self-pay | Admitting: Oncology

## 2012-12-19 ENCOUNTER — Telehealth: Payer: Self-pay | Admitting: Oncology

## 2012-12-19 ENCOUNTER — Telehealth: Payer: Self-pay | Admitting: *Deleted

## 2012-12-19 NOTE — Telephone Encounter (Signed)
s.w. pt husband and advised on 5.08.14 tx.Marland KitchenMarland Kitchen

## 2012-12-19 NOTE — Telephone Encounter (Signed)
Per staff message and POF I have scheduled appts.  JMW  

## 2012-12-20 ENCOUNTER — Ambulatory Visit: Payer: Medicare Other | Admitting: Gynecologic Oncology

## 2012-12-21 ENCOUNTER — Ambulatory Visit (HOSPITAL_BASED_OUTPATIENT_CLINIC_OR_DEPARTMENT_OTHER): Payer: Medicare Other | Admitting: Oncology

## 2012-12-21 ENCOUNTER — Ambulatory Visit: Payer: Medicare Other

## 2012-12-21 ENCOUNTER — Telehealth: Payer: Self-pay | Admitting: Oncology

## 2012-12-21 ENCOUNTER — Other Ambulatory Visit (HOSPITAL_BASED_OUTPATIENT_CLINIC_OR_DEPARTMENT_OTHER): Payer: Medicare Other

## 2012-12-21 ENCOUNTER — Encounter: Payer: Self-pay | Admitting: Oncology

## 2012-12-21 VITALS — BP 169/101 | HR 112 | Temp 97.7°F | Resp 18 | Ht 59.25 in | Wt 179.4 lb

## 2012-12-21 VITALS — BP 158/88 | HR 83 | Temp 97.0°F | Resp 20

## 2012-12-21 DIAGNOSIS — R739 Hyperglycemia, unspecified: Secondary | ICD-10-CM

## 2012-12-21 DIAGNOSIS — C5700 Malignant neoplasm of unspecified fallopian tube: Secondary | ICD-10-CM

## 2012-12-21 DIAGNOSIS — C5702 Malignant neoplasm of left fallopian tube: Secondary | ICD-10-CM

## 2012-12-21 LAB — CBC WITH DIFFERENTIAL/PLATELET
BASO%: 0.9 % (ref 0.0–2.0)
Basophils Absolute: 0 10*3/uL (ref 0.0–0.1)
EOS%: 2.8 % (ref 0.0–7.0)
Eosinophils Absolute: 0.1 10*3/uL (ref 0.0–0.5)
HCT: 37.6 % (ref 34.8–46.6)
HGB: 12.5 g/dL (ref 11.6–15.9)
LYMPH%: 40.2 % (ref 14.0–49.7)
MCH: 28.7 pg (ref 25.1–34.0)
MCHC: 33.3 g/dL (ref 31.5–36.0)
MCV: 86.2 fL (ref 79.5–101.0)
MONO#: 0.3 10*3/uL (ref 0.1–0.9)
MONO%: 6.5 % (ref 0.0–14.0)
NEUT#: 2.3 10*3/uL (ref 1.5–6.5)
NEUT%: 49.6 % (ref 38.4–76.8)
Platelets: 163 10*3/uL (ref 145–400)
RBC: 4.36 10*6/uL (ref 3.70–5.45)
RDW: 13.6 % (ref 11.2–14.5)
WBC: 4.6 10*3/uL (ref 3.9–10.3)
lymph#: 1.8 10*3/uL (ref 0.9–3.3)

## 2012-12-21 LAB — BASIC METABOLIC PANEL (CC13)
BUN: 12.9 mg/dL (ref 7.0–26.0)
CO2: 25 mEq/L (ref 22–29)
Calcium: 9.6 mg/dL (ref 8.4–10.4)
Chloride: 104 mEq/L (ref 98–107)
Creatinine: 0.7 mg/dL (ref 0.6–1.1)
Glucose: 116 mg/dl — ABNORMAL HIGH (ref 70–99)
Potassium: 4 mEq/L (ref 3.5–5.1)
Sodium: 139 mEq/L (ref 136–145)

## 2012-12-21 MED ORDER — SODIUM CHLORIDE 0.9 % IJ SOLN
10.0000 mL | INTRAMUSCULAR | Status: DC | PRN
  Administered 2012-12-21: 10 mL via INTRAVENOUS
  Filled 2012-12-21: qty 10

## 2012-12-21 MED ORDER — HEPARIN SOD (PORK) LOCK FLUSH 100 UNIT/ML IV SOLN
500.0000 [IU] | Freq: Once | INTRAVENOUS | Status: AC
  Administered 2012-12-21: 500 [IU] via INTRAVENOUS
  Filled 2012-12-21: qty 5

## 2012-12-21 NOTE — Progress Notes (Signed)
OFFICE PROGRESS NOTE   12/21/2012   Physicians:Gehrig, Wendee Beavers, MD (PCP), Dohmeier, Porfirio Mylar; (Sypher, R), rheumatology (?Dierdre Forth)   INTERVAL HISTORY:   Patient is seen, alone for visit, in follow up of cycle 1 taxol/ carboplatin (q 3 week dosing) given 12-13-12. She had some itching and swelling of eyes during taxol which may have been a mild taxol reaction, but no other problems.  Patient had presented to PCP in ~ Jan 2014 with complaints of abdominal distension and change in bowels. She had had unremarkable colonoscopy July 2013 (not in this EMR, and patient does not recall which MD). She is post hysterectomy in 1990s. Abdominal symptoms did not improve with interventions for constipation, and CT AP was done in [place] system 11-01-12 with finding of large mid abdominal mass. She was referred to Dr Duard Brady. Preop CA 125 is not available in this EMR; preop CXR had some linear scarring in lingula without acute findings. She went to exploratory laparotomy, bilateral salpingo-oophorectomy, appendectomy, and infacolic omentectomy, which was optimal debulking, by Dr Duard Brady on 11-08-2012. Operative findings: 25 cm right adnexal mass with smooth surface. Surgically absent uterus. Atrophic-appearing left ovary. Within the omentum there were centimeter nodules scattered throughout. The remainder of the surfaces were benign  Her post operative course was unremarkable. She saw Dr Duard Brady in follow up on 11-17-12, with recommendation for 6 cycles of taxol/ carboplatin. She had PAC placed by IR on 12-08-12 and cycle 1 taxol carbo 12-13-12.  Patient has had no nausea, no taxol aches discernable from usual fibromyalgia, no peripheral neuropathy symptoms, still small amount of vaginal bleeding and usual easy bruising but no other bleeding. She has been able to eat and to drink fluids. Bowels are moving 5-6x daily as is her baseline, but she is having to strain with BMs which is uncomfortable so will try colace.  She has had no problems with PAC, no further itching or lacrimation of eyes, no fever or symptoms of infection. She notices more fatigue and rests when needed.  Note she had just eaten a different granola bar just as eye symptoms began, wonders if this was food related. Remainder of 10 point Review of Systems negative.  Objective:  Vital signs in last 24 hours:  BP 169/101  Pulse 112  Temp(Src) 97.7 F (36.5 C) (Oral)  Resp 18  Ht 4' 11.25" (1.505 m)  Wt 179 lb 6.4 oz (81.375 kg)  BMI 35.93 kg/m2  Weight is up 4 lbs. Repeat BP 158/ 88. Easily ambulatory, looks comfortable, very pleasant. No alopecia  HEENT:PERRLA, sclera clear, anicteric and oropharynx clear, no lesions. No excessive lacrimation, no periorbital swelling. LymphaticsCervical, supraclavicular, and axillary nodes normal. Resp: clear to auscultation bilaterally and normal percussion bilaterally Cardio: regular rate and rhythm GI: surgical incision closed and not tender or remarkable. Normal BS thruout. Not distended. Soft, not tender, no HSM or mass Extremities: extremities normal, atraumatic, no cyanosis or edema Neuro:no sensory deficits noted Breast:normal without suspicious masses, skin or nipple changes or axillary nodes Portacath -without erythema or tenderness  Lab Results:  Results for orders placed in visit on 12/21/12  CBC WITH DIFFERENTIAL      Result Value Range   WBC 4.6  3.9 - 10.3 10e3/uL   NEUT# 2.3  1.5 - 6.5 10e3/uL   HGB 12.5  11.6 - 15.9 g/dL   HCT 40.9  81.1 - 91.4 %   Platelets 163  145 - 400 10e3/uL   MCV 86.2  79.5 - 101.0 fL  MCH 28.7  25.1 - 34.0 pg   MCHC 33.3  31.5 - 36.0 g/dL   RBC 1.61  0.96 - 0.45 10e6/uL   RDW 13.6  11.2 - 14.5 %   lymph# 1.8  0.9 - 3.3 10e3/uL   MONO# 0.3  0.1 - 0.9 10e3/uL   Eosinophils Absolute 0.1  0.0 - 0.5 10e3/uL   Basophils Absolute 0.0  0.0 - 0.1 10e3/uL   NEUT% 49.6  38.4 - 76.8 %   LYMPH% 40.2  14.0 - 49.7 %   MONO% 6.5  0.0 - 14.0 %   EOS% 2.8   0.0 - 7.0 %   BASO% 0.9  0.0 - 2.0 %  BASIC METABOLIC PANEL (CC13)      Result Value Range   Sodium 139  136 - 145 mEq/L   Potassium 4.0  3.5 - 5.1 mEq/L   Chloride 104  98 - 107 mEq/L   CO2 25  22 - 29 mEq/L   Glucose 116 (*) 70 - 99 mg/dl   BUN 40.9  7.0 - 81.1 mg/dL   Creatinine 0.7  0.6 - 1.1 mg/dL   Calcium 9.6  8.4 - 91.4 mg/dL   BMET resulted after visit and will be communicated to patient CA 125 on 12-09-12 was 17.7  We have discussed counts above, which are not yet at nadir from cycle 1 treatment. She will call prior to scheduled visit next week if marked fatigue, symptoms of infection, bleeding.  Studies/Results:  No results found.  Medications: I have reviewed the patient's current medications. Potassium is good on present 10 mEq daily; we may be able to decrease this depending on next chemistries. We have reviewed all of premedication for taxol.  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 I will see her back with CBC on 5-20, cycle 2 due 5-28. She has not had gCSF. Possible mild taxol reaction vs food reaction cycle 1, so will be sure to give all of premedication and watch closely. 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  Jaysun Wessels P, MD   12/21/2012, 11:58 AM

## 2012-12-21 NOTE — Patient Instructions (Signed)
Call if you are extremely fatigued or if fever or symptoms of infection prior to next visit and blood counts on 12-27-12     [phone]

## 2012-12-21 NOTE — Telephone Encounter (Signed)
gv and printed appt sched and avs for pt...for may and June

## 2012-12-23 ENCOUNTER — Telehealth: Payer: Self-pay

## 2012-12-23 NOTE — Telephone Encounter (Signed)
Told Kristin Webb the information noted below by Dr. Darrold Span regarding KCL.  Pt. Verbalized understanding.

## 2012-12-23 NOTE — Telephone Encounter (Signed)
Message copied by Lorine Bears on Fri Dec 23, 2012  6:36 PM ------      Message from: Reece Packer      Created: Thu Dec 22, 2012 11:40 AM       Please let her know K+ good by labs 5-14. Continue K 10 mEq daily and we may be able to decrease this if next chemistries holding well.            Cc AM, LA, TH ------

## 2012-12-27 ENCOUNTER — Ambulatory Visit (HOSPITAL_BASED_OUTPATIENT_CLINIC_OR_DEPARTMENT_OTHER): Payer: Medicare Other | Admitting: Oncology

## 2012-12-27 ENCOUNTER — Telehealth: Payer: Self-pay | Admitting: Oncology

## 2012-12-27 ENCOUNTER — Other Ambulatory Visit (HOSPITAL_BASED_OUTPATIENT_CLINIC_OR_DEPARTMENT_OTHER): Payer: Medicare Other | Admitting: Lab

## 2012-12-27 ENCOUNTER — Other Ambulatory Visit: Payer: Self-pay

## 2012-12-27 ENCOUNTER — Ambulatory Visit: Payer: Medicare Other

## 2012-12-27 ENCOUNTER — Ambulatory Visit: Payer: Medicare Other | Admitting: Gynecologic Oncology

## 2012-12-27 ENCOUNTER — Encounter: Payer: Self-pay | Admitting: Gynecologic Oncology

## 2012-12-27 ENCOUNTER — Encounter: Payer: Self-pay | Admitting: Oncology

## 2012-12-27 VITALS — BP 144/88 | HR 78 | Temp 98.6°F | Resp 22 | Ht 60.0 in | Wt 180.6 lb

## 2012-12-27 VITALS — BP 175/88 | HR 77 | Temp 97.8°F

## 2012-12-27 VITALS — Resp 18 | Ht 59.0 in | Wt 182.0 lb

## 2012-12-27 DIAGNOSIS — M519 Unspecified thoracic, thoracolumbar and lumbosacral intervertebral disc disorder: Secondary | ICD-10-CM

## 2012-12-27 DIAGNOSIS — C5700 Malignant neoplasm of unspecified fallopian tube: Secondary | ICD-10-CM

## 2012-12-27 DIAGNOSIS — D709 Neutropenia, unspecified: Secondary | ICD-10-CM

## 2012-12-27 DIAGNOSIS — C569 Malignant neoplasm of unspecified ovary: Secondary | ICD-10-CM

## 2012-12-27 DIAGNOSIS — C5702 Malignant neoplasm of left fallopian tube: Secondary | ICD-10-CM

## 2012-12-27 LAB — COMPREHENSIVE METABOLIC PANEL (CC13)
ALT: 32 U/L (ref 0–55)
AST: 22 U/L (ref 5–34)
Albumin: 3.6 g/dL (ref 3.5–5.0)
Alkaline Phosphatase: 80 U/L (ref 40–150)
BUN: 11.7 mg/dL (ref 7.0–26.0)
CO2: 24 mEq/L (ref 22–29)
Calcium: 8.7 mg/dL (ref 8.4–10.4)
Chloride: 108 mEq/L — ABNORMAL HIGH (ref 98–107)
Creatinine: 0.6 mg/dL (ref 0.6–1.1)
Glucose: 97 mg/dl (ref 70–99)
Potassium: 4 mEq/L (ref 3.5–5.1)
Sodium: 141 mEq/L (ref 136–145)
Total Bilirubin: 0.33 mg/dL (ref 0.20–1.20)
Total Protein: 6.6 g/dL (ref 6.4–8.3)

## 2012-12-27 LAB — CBC WITH DIFFERENTIAL/PLATELET
BASO%: 1 % (ref 0.0–2.0)
Basophils Absolute: 0 10*3/uL (ref 0.0–0.1)
EOS%: 3.4 % (ref 0.0–7.0)
Eosinophils Absolute: 0.1 10*3/uL (ref 0.0–0.5)
HCT: 38.3 % (ref 34.8–46.6)
HGB: 12.5 g/dL (ref 11.6–15.9)
LYMPH%: 58.7 % — ABNORMAL HIGH (ref 14.0–49.7)
MCH: 28.6 pg (ref 25.1–34.0)
MCHC: 32.6 g/dL (ref 31.5–36.0)
MCV: 87.6 fL (ref 79.5–101.0)
MONO#: 0.5 10*3/uL (ref 0.1–0.9)
MONO%: 17.4 % — ABNORMAL HIGH (ref 0.0–14.0)
NEUT#: 0.6 10*3/uL — ABNORMAL LOW (ref 1.5–6.5)
NEUT%: 19.5 % — ABNORMAL LOW (ref 38.4–76.8)
Platelets: 163 10*3/uL (ref 145–400)
RBC: 4.37 10*6/uL (ref 3.70–5.45)
RDW: 14.2 % (ref 11.2–14.5)
WBC: 2.9 10*3/uL — ABNORMAL LOW (ref 3.9–10.3)
lymph#: 1.7 10*3/uL (ref 0.9–3.3)
nRBC: 0 % (ref 0–0)

## 2012-12-27 LAB — CA 125: CA 125: 5.3 U/mL (ref 0.0–30.2)

## 2012-12-27 MED ORDER — DEXAMETHASONE 4 MG PO TABS: ORAL_TABLET | ORAL | Status: DC

## 2012-12-27 MED ORDER — HYDROCODONE-ACETAMINOPHEN 5-325 MG PO TABS: ORAL_TABLET | ORAL | Status: DC

## 2012-12-27 MED ORDER — SODIUM CHLORIDE 0.9 % IJ SOLN
10.0000 mL | INTRAMUSCULAR | Status: DC | PRN
  Administered 2012-12-27: 10 mL via INTRAVENOUS
  Filled 2012-12-27: qty 10

## 2012-12-27 MED ORDER — HEPARIN SOD (PORK) LOCK FLUSH 100 UNIT/ML IV SOLN
500.0000 [IU] | Freq: Once | INTRAVENOUS | Status: AC
  Administered 2012-12-27: 500 [IU] via INTRAVENOUS
  Filled 2012-12-27: qty 5

## 2012-12-27 MED ORDER — FILGRASTIM 300 MCG/0.5ML IJ SOLN
300.0000 ug | Freq: Once | INTRAMUSCULAR | Status: AC
  Administered 2012-12-27: 300 ug via SUBCUTANEOUS
  Filled 2012-12-27: qty 0.5

## 2012-12-27 NOTE — Progress Notes (Signed)
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 She is neutropenic today and will begin neupogen. She ha had no fever or symptoms of infection. Primary complaint today is pain LLE since 12-22-12.   ONCOLOGIC HISTORY Patient presented in ~ Jan 2014 with complaints of abdominal distension and change in bowels. She had had unremarkable colonoscopy July 2013 (not in this EMR, and patient does not recall which MD). She is post hysterectomy in 1990s. Abdominal symptoms did not improve with interventions for constipation, and CT AP was done in [place] system 11-01-12 with finding of large mid abdominal mass. She was referred to Dr Duard Brady. Preop CA 125 is not available in this EMR; preop CXR had some linear scarring in lingula without acute findings. She went to exploratory laparotomy, bilateral salpingo-oophorectomy, appendectomy, and infacolic omentectomy, which was optimal debulking, by Dr Duard Brady on 11-08-2012. Operative findings: 25 cm right adnexal mass with smooth surface. Surgically absent uterus. Atrophic-appearing left ovary. Within the omentum there were centimeter nodules scattered throughout. The remainder of the surfaces were benign. Her post operative course was unremarkable. She saw Dr Duard Brady in follow up on 11-17-12, with recommendation for 6 cycles of taxol/ carboplatin. She had PAC placed by IR on 12-08-12 and cycle 1 taxol carbo 12-13-12. She is neutropenic now day 15 cycle 1. She did not have significant taxol aches with first cycle.  No trauma to LLE. Pain began in lateral and anterior lower leg and subsequently also in thigh, with no swelling and no low back pain. She has history of disc problems, with MRI LS 05-2009  with disc findings from L2-3 thru L5-S1. No other pain. No nausea or vomiting. Less discomfort with bowels. No SOB at rest. Remainder of 10 point Review of Systems negative.  Objective:  Vital signs in last 24 hours:  Resp 18  Ht 4\' 11"  (1.499 m)  Wt 182 lb (82.555 kg)  BMI 36.74 kg/m2 151/75. HR 92 regular. Temp 98.3 oral. Alert, uncomfortable from LLE otherwise NAD. Repirations not labored. Ambulatory.  HEENT:PERRLA, sclera clear, anicteric and oropharynx clear, no lesions LymphaticsCervical, supraclavicular, and axillary nodes normal. Resp: clear to auscultation bilaterally and normal percussion bilaterally Cardio: regular rate and rhythm GI: obese, soft, nontender, surgical incision not remarkable Extremities: extremities normal, atraumatic, no cyanosis or edema Neuro:no sensory deficits noted Skin without rash or ecchymosis Portacath-without erythema or tenderness  Lab Results:  Results for orders placed in visit on 12/27/12  CBC WITH DIFFERENTIAL      Result Value Range   WBC 2.9 (*) 3.9 - 10.3 10e3/uL   NEUT# 0.6 (*) 1.5 - 6.5 10e3/uL   HGB 12.5  11.6 - 15.9 g/dL   HCT 29.5  28.4 - 13.2 %   Platelets 163  145 - 400 10e3/uL   MCV 87.6  79.5 - 101.0 fL   MCH 28.6  25.1 - 34.0 pg   MCHC 32.6  31.5 - 36.0 g/dL   RBC 4.40  1.02 - 7.25 10e6/uL   RDW 14.2  11.2 - 14.5 %   lymph# 1.7  0.9 - 3.3 10e3/uL   MONO# 0.5  0.1 - 0.9 10e3/uL   Eosinophils Absolute 0.1  0.0 - 0.5 10e3/uL   Basophils Absolute 0.0  0.0 - 0.1 10e3/uL  NEUT% 19.5 (*) 38.4 - 76.8 %   LYMPH% 58.7 (*) 14.0 - 49.7 %   MONO% 17.4 (*) 0.0 - 14.0 %   EOS% 3.4  0.0 - 7.0 %   BASO% 1.0  0.0 - 2.0 %   nRBC 0  0 - 0 %  COMPREHENSIVE METABOLIC PANEL (CC13)      Result Value Range   Sodium 141  136 - 145 mEq/L   Potassium 4.0  3.5 - 5.1 mEq/L   Chloride 108 (*) 98 - 107 mEq/L   CO2 24  22 - 29 mEq/L   Glucose 97  70 - 99 mg/dl   BUN 16.1  7.0 - 09.6 mg/dL   Creatinine 0.6  0.6 - 1.1 mg/dL   Total  Bilirubin 0.45  0.20 - 1.20 mg/dL   Alkaline Phosphatase 80  40 - 150 U/L   AST 22  5 - 34 U/L   ALT 32  0 - 55 U/L   Total Protein 6.6  6.4 - 8.3 g/dL   Albumin 3.6  3.5 - 5.0 g/dL   Calcium 8.7  8.4 - 40.9 mg/dL     Studies/Results: CT AP 11-01-12 report noted. MRI LS from 2010 reviewed, with disc findings corresponding to LLE symptoms.  Medications: I have reviewed the patient's current medications. We have discussed addition of gCSF as neupogen now and 5-21; will repeat CBC on 5-22 and give additional neupogen then if needed. She will need  neupogen vs neulasta after subsequent chemotherapy treatments.  She has been instructed in neutropenic precautions.  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  gCSF as above. She will have cycle 2 on 5-28 as long as ANC >=1.5 and plt >=100k. Possible mild taxol reaction vs food reaction cycle 1, so will be sure to give all of premedication and watch closely.  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 lumbar disc disease which appears to be symptomatic with LLE pain now. She will avoid activities that aggravate symptoms and use prn for pain. She will let this office know which neurosurgeon she saw for this problem previously. 6.obesity,sleep apnea, fibromyalgia  I will see her back 6-4 or sooner if needed     [name],LENNIS P, MD   12/27/2012, 12:55 PM

## 2012-12-27 NOTE — Progress Notes (Signed)
Follow Up Note: Gyn-Onc  Kristin Webb 67 y.o. female  CC:  Chief Complaint  Patient presents with  . Ovarian Cancer    Follow up    HPI:  Kristin Webb is a 67 year old, gravida 2 para 2, who noticed increasing abdominal swelling around the third week of January.  Initially, the abdominal swelling was intermittent and would come and go.  She related this to recent initiation of a calcium supplement.  The abdominal swelling became more prevalent starting in February with abdominal pain reported also.  In addition, she started having increasing constipation and formed stools, which was a change in her routine of 5-6 loose bowel movements a day chronically after her cholecystectomy.  On November 01, 2012, she had a CT scan of the abdomen and pelvis which resulted no significant biliary dilation, no suspicious liver lesions, and the spleen, adrenal glands and pancreas appeared normal. 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 She has a large midabdominal mass measuring 17 x 22.7 cm x 20 cm cephalad, which was well circumscribed without calcifications. There is irregular thickened septations and areas of solid nodularity consistent with malignancy. There is no bowel obstruction and the mass appeared to be separate from the appendix.  There was probable normal left ovarian tissue seen. The uterus is surgically absent. There is some soft tissue stranding at the base of the mesentery superior to the mass and some omental nodularity.  She was then referred to Gynecologic Oncology.    On November 08, 2012, she underwent an exploratory laparotomy, BSO, appendectomy, infracolic omentectomy, and optimal debulking by Dr. Cleda Mccreedy.  Operative findings included:  25 cm right adnexal mass with smooth surface. Surgically absent uterus. Atrophic-appearing left ovary. Normal appearing appendix. Within the omentum there were centimeter nodules  scattered throughout the omentum. The remainder of the surfaces were benign.  Final pathology revealed:  Diagnosis 1. 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 - BENIGN FALLOPIAN TUBE WITH NONSPECIFIC CHRONIC INFLAMMATION. 2. Ovary and fallopian tube, left - BENIGN OVARY; NEGATIVE FOR ATYPIA OR MALIGNANCY. - BENIGN FALLOPIAN TUBE; NEGATIVE FOR ATYPIA OR MALIGNANCY. 3. Omentum, resection for tumor - HIGH GRADE CARCINOMA, SEE COMMENT. 4. Appendix, Other than Incidental - FIBROUS OBLITERATION OF APPENDICEAL TIP. - NEGATIVE FOR MALIGNANCY.  Recent HPI:  She presents today for continued follow up and for a routine post-operative evaluation.  Patient describes expected post operative status.  Adequate PO intake reported.  Bowels and bladder functioning without difficulty.  She takes Senna-S on a daily basis to prevent constipation.  Pain minimal.  She saw Dr. Darrold Span earlier this am and has been placed on neutropenic precautions.  She was given a Neupogen injection and reports mild tearing of the eyes during the visit.  She denies fever, chills, weakness, or fatigue.  She reports recent burning sensations radiating down her left leg intermittently and dull aches.  She reports having had issues with her lower back in the past with burning sensations noted bilaterally in the lower extremities.  She states that with moderate activity, she begins to walk with a limp at the end of the day.  She also reports intermittent, mild abdominal pain that resolves after a few minutes.  No relief reported with ibuprofen use.  She states that she was  given hydrocodone by Dr. Darrold Span that she plans to try at nighttime.  She reports having a good appetite with no reports of early satiety.  She reports minimal amount of dark-brown vaginal spotting intermittently that has  decreased over the past several weeks.  She recently had her PAC placed on 12/08/12 and is due to begin cycle 2 on 01/04/13.  She denies nausea, vomiting, bright red vaginal bleeding/discharge, or rectal bleeding.  No other concerns voiced.      Review of Systems: Constitutional: Feels well.  Cardiovascular: No chest pain, shortness of breath, or edema.  Pulmonary: No cough or wheeze.  Gastrointestinal: No nausea, vomiting, or constipation. No bright red blood per rectum or change in bowel movement.  Genitourinary: No frequency, urgency, or dysuria. No bright red vaginal bleeding or discharge.  Musculoskeletal:  Intermittent left lower extremity ache and burning sensation. Neurologic: No weakness or numbness.  Begins to walk with a limp in the left lower extremity at the end of the day.  Psychology: No depression, anxiety, or insomnia.  Current Meds:  Outpatient Encounter Prescriptions as of 12/27/2012  Medication Sig Dispense Refill  . Calcium Citrate-Vitamin D (CALCIUM CITRATE + D PO) Take 1 capsule by mouth daily.       . Cholecalciferol (VITAMIN D3) 5000 UNITS CAPS Take 5,000 mg by mouth daily.       . CRESTOR 10 MG tablet       . dexamethasone (DECADRON) 4 MG tablet Take 5 tablets (20mg ) 12 hours and 6 hours prior to taxol. Take with food  20 tablet  0  . diphenhydrAMINE (BENADRYL) 25 mg capsule Take 50-75 mg by mouth at bedtime as needed for sleep.       Marland Kitchen EPINEPHrine (EPIPEN 2-PAK) 0.3 mg/0.3 mL DEVI Inject 0.3 mg into the muscle once. For bee stings      . fish oil-omega-3 fatty acids 1000 MG capsule Take 1 g by mouth 2 (two) times daily.      Marland Kitchen FLUoxetine (PROZAC) 10 MG capsule Take 30 mg by mouth every evening.      Marland Kitchen glucosamine-chondroitin 500-400 MG tablet Take 1 tablet by mouth 2 (two) times daily.      Marland Kitchen lidocaine-prilocaine (EMLA) cream Apply topically as needed. Apply to port 1 hour before access.  30 g  2  . Melatonin 10 MG TABS Take 10 mg by mouth at bedtime.      .  Multiple Vitamin (MULTIVITAMIN WITH MINERALS) TABS Take 1 tablet by mouth daily.      . ondansetron (ZOFRAN) 8 MG tablet every 12 (twelve) hours as needed. 1-2 tablets every 12 hours as needed for nausea after chemo      . OVER THE COUNTER MEDICATION Nystatin topical powder  100,000 units/ GM  Patient uses as needed      . potassium chloride (K-DUR) 10 MEQ tablet Take 1 tablet (10 mEq total) by mouth daily.  14 tablet  0  . prochlorperazine (COMPAZINE) 10 MG tablet 1 tablet every 6 hours as needed for nausea  20 tablet  0  . sennosides-docusate sodium (SENOKOT-S) 8.6-50 MG tablet Take 2 tablets by mouth every 12 (twelve) hours.      Marland Kitchen HYDROcodone-acetaminophen (NORCO/VICODIN) 5-325 MG per tablet Take 1 tab every 4-6 hours as needed for pain  20 tablet  0  . ibuprofen (ADVIL,MOTRIN) 200 MG tablet Take 200 mg by mouth every 6 (six) hours as needed for pain.       No facility-administered encounter  medications on file as of 12/27/2012.    Allergy:  Allergies  Allergen Reactions  . Bee Venom Hives    Difficulty breathing, carries an EPI-pen  . Cortisone     Injected cortisone, "sick to my stomach, throwing up, hand swelling, and pain."    Social Hx:   History   Social History  . Marital Status: Married    Spouse Name: N/A    Number of Children: N/A  . Years of Education: N/A   Occupational History  . Not on file.   Social History Main Topics  . Smoking status: Never Smoker   . Smokeless tobacco: Never Used  . Alcohol Use: No  . Drug Use: No  . Sexually Active: Not on file   Other Topics Concern  . Not on file   Social History Narrative  . No narrative on file    Past Surgical Hx:  Past Surgical History  Procedure Laterality Date  . Cholecystectomy      early 64s  . Abdominal hysterectomy      early 1990s  . Trigger finger release    . Laparotomy Bilateral 11/08/2012    Procedure: EXPLORATORY LAPAROTOMY TOTAL ABDOMINAL HYSTERECTOMY BILATERAL SALPINGO OOPHORECTOMY  TUMOR DEBULKING ;  Surgeon: Rejeana Brock A. Duard Brady, MD;  Location: [facility];  Service: Gynecology;  Laterality: Bilateral;  APPENDECTOMY / OMENTECTOMY  . Appendectomy    . Exploratory laparotomy  11/08/12    BSO, appendectomy, omentectomy    Past Medical Hx:  Past Medical History  Diagnosis Date  . Pelvic mass in female   . Constipation   . Diarrhea     in the past after gallbladder removal  . Hyperlipidemia   . Burning mouth syndrome   . Chronic fatigue   . Fibromyalgia   . Lumbar disc disease   . PONV (postoperative nausea and vomiting)   . Hypertension     borderline not on meds   . Shortness of breath     with exertion   . Sleep apnea     CPAP settings at 12   . Pneumonia     hx of pneumonia   . GERD (gastroesophageal reflux disease)   . Yeast infection     Family Hx:  Family History  Problem Relation Age of Onset  . Lung cancer Mother   . Lung cancer Father     Vitals:  Blood pressure 144/88, pulse 78, temperature 98.6 F (37 C), resp. rate 22, height 5' (1.524 m), weight 180 lb 9.6 oz (81.92 kg).  Physical Exam: General: Well developed, well nourished female in no acute distress. Alert and oriented x 3.  Patient wearing a mask.  Head/Neck: Sclerae anicteric with no excessive lacrimation.  Supple without any enlargements.  Lymph node survey: No cervical, supraclavicular, or inguinal adenopathy.  Cardiovascular: Regular rate and rhythm. S1 and S2 normal.  Lungs: Clear to auscultation bilaterally. No wheezes/crackles/rhonchi noted.  Skin: No rashes or lesions present. Back: No CVA tenderness.  Abdomen: Abdomen soft, non-tender and obese. Active bowel sounds in all quadrants. No evidence of a fluid wave.  Midline abdominal incision well healed with no evidence of herniation.  Genitourinary:    Vulva/vagina: Normal external female genitalia. No lesions.    Urethra: No lesions or masses.    Vagina: Atrophic without any lesions. No palpable masses. No vaginal bleeding or  drainage noted.  Vaginal cuff intact.  Extremities: No bilateral cyanosis, edema, or clubbing.   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 She was completely resected to an R0.  She is advised that she may resume sexual intercourse and begin taking tub baths as tolerated.  She is to continue with her current chemotherapy regimen and schedule per Dr. Darrold Span with recommendations for 6 cycles of taxol and carboplatin per Dr. Duard Brady.  Neutropenic precautions reinforced.  She will be scheduled for a follow up appointment with Dr. Duard Brady based around her planned chemotherapy regimen.  She is advised to call for any questions or concerns.  Reportable signs and symptoms reviewed.      Cing Bethel, Georgiann Mohs, MD 12/27/2012, 11:54 AM

## 2012-12-27 NOTE — Patient Instructions (Signed)
Doing well.  You may resume sexual intercourse and tub baths as tolerated.  Continue neutropenic precautions per Dr. Darrold Span.  Reportable signs and symptoms reviewed.  Follow up appointment will be arranged in the near future based on chemotherapy schedule.  Please call for any questions or concerns.  Thank you for coming to see me today.  I appreciate your confidence in choosing [facility] for your medical care.  If you have any questions about your visit today, please call our office and we will get back to you as soon as possible.  Warner Mccreedy, NP Gynecologic Oncology

## 2012-12-28 ENCOUNTER — Ambulatory Visit (HOSPITAL_BASED_OUTPATIENT_CLINIC_OR_DEPARTMENT_OTHER): Payer: Medicare Other

## 2012-12-28 VITALS — BP 151/75 | HR 92 | Temp 98.3°F

## 2012-12-28 DIAGNOSIS — Z5189 Encounter for other specified aftercare: Secondary | ICD-10-CM

## 2012-12-28 DIAGNOSIS — C5700 Malignant neoplasm of unspecified fallopian tube: Secondary | ICD-10-CM

## 2012-12-28 DIAGNOSIS — C5702 Malignant neoplasm of left fallopian tube: Secondary | ICD-10-CM

## 2012-12-28 MED ORDER — FILGRASTIM 300 MCG/0.5ML IJ SOLN
300.0000 ug | Freq: Once | INTRAMUSCULAR | Status: AC
  Administered 2012-12-28: 300 ug via SUBCUTANEOUS
  Filled 2012-12-28: qty 0.5

## 2012-12-29 ENCOUNTER — Other Ambulatory Visit (HOSPITAL_BASED_OUTPATIENT_CLINIC_OR_DEPARTMENT_OTHER): Payer: Medicare Other | Admitting: Lab

## 2012-12-29 ENCOUNTER — Telehealth: Payer: Self-pay

## 2012-12-29 ENCOUNTER — Ambulatory Visit: Payer: Medicare Other

## 2012-12-29 DIAGNOSIS — C5700 Malignant neoplasm of unspecified fallopian tube: Secondary | ICD-10-CM

## 2012-12-29 DIAGNOSIS — C5702 Malignant neoplasm of left fallopian tube: Secondary | ICD-10-CM

## 2012-12-29 LAB — CBC WITH DIFFERENTIAL/PLATELET
BASO%: 0.3 % (ref 0.0–2.0)
Basophils Absolute: 0 10*3/uL (ref 0.0–0.1)
EOS%: 0.9 % (ref 0.0–7.0)
Eosinophils Absolute: 0.1 10*3/uL (ref 0.0–0.5)
HCT: 40.4 % (ref 34.8–46.6)
HGB: 13.2 g/dL (ref 11.6–15.9)
LYMPH%: 16 % (ref 14.0–49.7)
MCH: 29 pg (ref 25.1–34.0)
MCHC: 32.7 g/dL (ref 31.5–36.0)
MCV: 88.8 fL (ref 79.5–101.0)
MONO#: 1.2 10*3/uL — ABNORMAL HIGH (ref 0.1–0.9)
MONO%: 8.8 % (ref 0.0–14.0)
NEUT#: 10.4 10*3/uL — ABNORMAL HIGH (ref 1.5–6.5)
NEUT%: 74 % (ref 38.4–76.8)
Platelets: 130 10*3/uL — ABNORMAL LOW (ref 145–400)
RBC: 4.55 10*6/uL (ref 3.70–5.45)
RDW: 14.7 % — ABNORMAL HIGH (ref 11.2–14.5)
WBC: 14 10*3/uL — ABNORMAL HIGH (ref 3.9–10.3)
lymph#: 2.2 10*3/uL (ref 0.9–3.3)
nRBC: 0 % (ref 0–0)

## 2012-12-29 NOTE — Telephone Encounter (Signed)
Ms. Broadfoot called and left a message that the neurosurgeon she saw was Dr.Kyle L. [employer]

## 2012-12-29 NOTE — Progress Notes (Signed)
Patient in for labs and possible Neupogen injection.  Orders are to hold Neupogen if ANC >1.2.   ANC today is 10.4.  Injection held as per Dr Darrold Span.

## 2013-01-01 ENCOUNTER — Other Ambulatory Visit: Payer: Self-pay | Admitting: Oncology

## 2013-01-03 ENCOUNTER — Telehealth: Payer: Self-pay | Admitting: *Deleted

## 2013-01-03 ENCOUNTER — Telehealth: Payer: Self-pay | Admitting: Oncology

## 2013-01-03 NOTE — Telephone Encounter (Signed)
Called patient regarding neulasta vs neupogen as indicated below. Pt states she is fine with the 1 shot of neulasta on 5/29.

## 2013-01-03 NOTE — Telephone Encounter (Signed)
Message copied by Phillis Knack on Tue Jan 03, 2013  3:56 PM ------      Message from: Reece Packer      Created: Sun Jan 01, 2013  3:49 PM       She was neutropenic after cycle 1, neupogen used then. She will need neupogen or neulasta after cycle 2 (5-28). Please see how she did with the 2 days of neupogen last week. Please give her choice of either neupogen x 3-4 days or one time neulasta, and tell her that neulasta may cause more aches.       I have put in orders for neulasta on 5-29, but can change that to neupogen if she prefers. I have put in order for injection 5-29 only.            Cc TH, LA ------

## 2013-01-03 NOTE — Telephone Encounter (Signed)
s.w. pt and advised on 5.29.14 inj...pt ok and aware

## 2013-01-04 ENCOUNTER — Ambulatory Visit: Payer: Medicare Other

## 2013-01-04 ENCOUNTER — Other Ambulatory Visit: Payer: Self-pay | Admitting: *Deleted

## 2013-01-04 ENCOUNTER — Ambulatory Visit (HOSPITAL_BASED_OUTPATIENT_CLINIC_OR_DEPARTMENT_OTHER): Payer: Medicare Other

## 2013-01-04 ENCOUNTER — Other Ambulatory Visit: Payer: Self-pay | Admitting: Oncology

## 2013-01-04 ENCOUNTER — Other Ambulatory Visit (HOSPITAL_BASED_OUTPATIENT_CLINIC_OR_DEPARTMENT_OTHER): Payer: Medicare Other | Admitting: Lab

## 2013-01-04 VITALS — BP 151/93 | HR 111 | Temp 97.6°F | Resp 20

## 2013-01-04 DIAGNOSIS — C5700 Malignant neoplasm of unspecified fallopian tube: Secondary | ICD-10-CM

## 2013-01-04 DIAGNOSIS — C569 Malignant neoplasm of unspecified ovary: Secondary | ICD-10-CM

## 2013-01-04 DIAGNOSIS — Z5111 Encounter for antineoplastic chemotherapy: Secondary | ICD-10-CM

## 2013-01-04 DIAGNOSIS — C5702 Malignant neoplasm of left fallopian tube: Secondary | ICD-10-CM

## 2013-01-04 LAB — CBC WITH DIFFERENTIAL/PLATELET
BASO%: 0.4 % (ref 0.0–2.0)
Basophils Absolute: 0 10*3/uL (ref 0.0–0.1)
EOS%: 0 % (ref 0.0–7.0)
Eosinophils Absolute: 0 10*3/uL (ref 0.0–0.5)
HCT: 42.2 % (ref 34.8–46.6)
HGB: 14.2 g/dL (ref 11.6–15.9)
LYMPH%: 11.4 % — ABNORMAL LOW (ref 14.0–49.7)
MCH: 29.3 pg (ref 25.1–34.0)
MCHC: 33.6 g/dL (ref 31.5–36.0)
MCV: 87.3 fL (ref 79.5–101.0)
MONO#: 0.1 10*3/uL (ref 0.1–0.9)
MONO%: 0.7 % (ref 0.0–14.0)
NEUT#: 6.3 10*3/uL (ref 1.5–6.5)
NEUT%: 87.5 % — ABNORMAL HIGH (ref 38.4–76.8)
Platelets: 108 10*3/uL — ABNORMAL LOW (ref 145–400)
RBC: 4.83 10*6/uL (ref 3.70–5.45)
RDW: 15.3 % — ABNORMAL HIGH (ref 11.2–14.5)
WBC: 7.2 10*3/uL (ref 3.9–10.3)
lymph#: 0.8 10*3/uL — ABNORMAL LOW (ref 0.9–3.3)

## 2013-01-04 MED ORDER — DEXAMETHASONE 4 MG PO TABS: ORAL_TABLET | ORAL | Status: DC

## 2013-01-04 MED ORDER — ONDANSETRON HCL 8 MG PO TABS: 8.0000 mg | ORAL_TABLET | Freq: Two times a day (BID) | ORAL | Status: DC | PRN

## 2013-01-04 MED ORDER — PROCHLORPERAZINE MALEATE 10 MG PO TABS: ORAL_TABLET | ORAL | Status: DC

## 2013-01-04 MED ORDER — ONDANSETRON 16 MG/50ML IVPB (CHCC)
16.0000 mg | Freq: Once | INTRAVENOUS | Status: AC
  Administered 2013-01-04: 16 mg via INTRAVENOUS

## 2013-01-04 MED ORDER — SODIUM CHLORIDE 0.9 % IV SOLN
499.1000 mg | Freq: Once | INTRAVENOUS | Status: AC
  Administered 2013-01-04: 500 mg via INTRAVENOUS
  Filled 2013-01-04: qty 50

## 2013-01-04 MED ORDER — FAMOTIDINE IN NACL 20-0.9 MG/50ML-% IV SOLN
20.0000 mg | Freq: Once | INTRAVENOUS | Status: AC
  Administered 2013-01-04: 20 mg via INTRAVENOUS

## 2013-01-04 MED ORDER — HEPARIN SOD (PORK) LOCK FLUSH 100 UNIT/ML IV SOLN
500.0000 [IU] | Freq: Once | INTRAVENOUS | Status: AC | PRN
  Administered 2013-01-04: 500 [IU]
  Filled 2013-01-04: qty 5

## 2013-01-04 MED ORDER — DIPHENHYDRAMINE HCL 50 MG/ML IJ SOLN
50.0000 mg | Freq: Once | INTRAMUSCULAR | Status: AC
  Administered 2013-01-04: 50 mg via INTRAVENOUS

## 2013-01-04 MED ORDER — SODIUM CHLORIDE 0.9 % IJ SOLN
10.0000 mL | INTRAMUSCULAR | Status: DC | PRN
Start: 2013-01-04 — End: 2013-01-04
  Administered 2013-01-04: 10 mL
  Filled 2013-01-04: qty 10

## 2013-01-04 MED ORDER — DEXAMETHASONE SODIUM PHOSPHATE 20 MG/5ML IJ SOLN
20.0000 mg | Freq: Once | INTRAMUSCULAR | Status: AC
  Administered 2013-01-04: 20 mg via INTRAVENOUS

## 2013-01-04 MED ORDER — HYDROCODONE-ACETAMINOPHEN 5-325 MG PO TABS: ORAL_TABLET | ORAL | Status: DC

## 2013-01-04 MED ORDER — SODIUM CHLORIDE 0.9 % IV SOLN
Freq: Once | INTRAVENOUS | Status: AC
  Administered 2013-01-04: 09:00:00 via INTRAVENOUS

## 2013-01-04 MED ORDER — SODIUM CHLORIDE 0.9 % IV SOLN
135.0000 mg/m2 | Freq: Once | INTRAVENOUS | Status: AC
  Administered 2013-01-04: 246 mg via INTRAVENOUS
  Filled 2013-01-04: qty 41

## 2013-01-04 NOTE — Patient Instructions (Addendum)
[facility] Discharge Instructions for Patients Receiving Chemotherapy  Today you received the following chemotherapy agents Taxol and Carboplatin.  To help prevent nausea and vomiting after your treatment, we encourage you to take your nausea medication. Begin taking your nausea medication as often as prescribed for by Dr Darrold Span.    If you develop nausea and vomiting that is not controlled by your nausea medication, call the clinic. If it is after clinic hours your family physician or the after hours number for the clinic or go to the Emergency Department.   BELOW ARE SYMPTOMS THAT SHOULD BE REPORTED IMMEDIATELY:  *FEVER GREATER THAN 100.5 F  *CHILLS WITH OR WITHOUT FEVER  NAUSEA AND VOMITING THAT IS NOT CONTROLLED WITH YOUR NAUSEA MEDICATION  *UNUSUAL SHORTNESS OF BREATH  *UNUSUAL BRUISING OR BLEEDING  TENDERNESS IN MOUTH AND THROAT WITH OR WITHOUT PRESENCE OF ULCERS  *URINARY PROBLEMS  *BOWEL PROBLEMS  UNUSUAL RASH Items with * indicate a potential emergency and should be followed up as soon as possible.  One of the nurses will contact you 24 hours after your treatment. Please let the nurse know about any problems that you may have experienced. Feel free to call the clinic you have any questions or concerns. The clinic phone number is [phone].   I have been informed and understand all the instructions given to me. I know to contact the clinic, my physician, or go to the Emergency Department if any problems should occur. I do not have any questions at this time, but understand that I may call the clinic during office hours   should I have any questions or need assistance in obtaining follow up care.    __________________________________________  _____________  __________ Signature of Patient or Authorized Representative            Date                   Time    __________________________________________ Nurse's Signature

## 2013-01-04 NOTE — Progress Notes (Signed)
Medical Oncology   Plt 108k day of treatment so carbo dose further decreased to AUC 3.5 for this treatment.  L.[name] MD

## 2013-01-05 ENCOUNTER — Ambulatory Visit (HOSPITAL_BASED_OUTPATIENT_CLINIC_OR_DEPARTMENT_OTHER): Payer: Medicare Other

## 2013-01-05 VITALS — BP 149/94 | HR 98 | Temp 97.6°F

## 2013-01-05 DIAGNOSIS — C5702 Malignant neoplasm of left fallopian tube: Secondary | ICD-10-CM

## 2013-01-05 DIAGNOSIS — C5700 Malignant neoplasm of unspecified fallopian tube: Secondary | ICD-10-CM

## 2013-01-05 MED ORDER — PEGFILGRASTIM INJECTION 6 MG/0.6ML
6.0000 mg | Freq: Once | SUBCUTANEOUS | Status: AC
  Administered 2013-01-05: 6 mg via SUBCUTANEOUS
  Filled 2013-01-05: qty 0.6

## 2013-01-11 ENCOUNTER — Other Ambulatory Visit (HOSPITAL_BASED_OUTPATIENT_CLINIC_OR_DEPARTMENT_OTHER): Payer: Medicare Other | Admitting: Lab

## 2013-01-11 ENCOUNTER — Ambulatory Visit (HOSPITAL_BASED_OUTPATIENT_CLINIC_OR_DEPARTMENT_OTHER): Payer: Medicare Other | Admitting: Oncology

## 2013-01-11 VITALS — BP 138/82 | HR 105 | Temp 97.6°F | Resp 19 | Ht 60.0 in | Wt 181.0 lb

## 2013-01-11 DIAGNOSIS — C5700 Malignant neoplasm of unspecified fallopian tube: Secondary | ICD-10-CM

## 2013-01-11 DIAGNOSIS — C5702 Malignant neoplasm of left fallopian tube: Secondary | ICD-10-CM

## 2013-01-11 DIAGNOSIS — C569 Malignant neoplasm of unspecified ovary: Secondary | ICD-10-CM

## 2013-01-11 DIAGNOSIS — R030 Elevated blood-pressure reading, without diagnosis of hypertension: Secondary | ICD-10-CM

## 2013-01-11 DIAGNOSIS — M519 Unspecified thoracic, thoracolumbar and lumbosacral intervertebral disc disorder: Secondary | ICD-10-CM

## 2013-01-11 LAB — CBC WITH DIFFERENTIAL/PLATELET
BASO%: 0.3 % (ref 0.0–2.0)
Basophils Absolute: 0 10*3/uL (ref 0.0–0.1)
EOS%: 1.9 % (ref 0.0–7.0)
Eosinophils Absolute: 0.2 10*3/uL (ref 0.0–0.5)
HCT: 40.7 % (ref 34.8–46.6)
HGB: 13.6 g/dL (ref 11.6–15.9)
LYMPH%: 22.8 % (ref 14.0–49.7)
MCH: 29.2 pg (ref 25.1–34.0)
MCHC: 33.4 g/dL (ref 31.5–36.0)
MCV: 87.5 fL (ref 79.5–101.0)
MONO#: 1.8 10*3/uL — ABNORMAL HIGH (ref 0.1–0.9)
MONO%: 14.3 % — ABNORMAL HIGH (ref 0.0–14.0)
NEUT#: 7.4 10*3/uL — ABNORMAL HIGH (ref 1.5–6.5)
NEUT%: 60.7 % (ref 38.4–76.8)
Platelets: 135 10*3/uL — ABNORMAL LOW (ref 145–400)
RBC: 4.65 10*6/uL (ref 3.70–5.45)
RDW: 15 % — ABNORMAL HIGH (ref 11.2–14.5)
WBC: 12.3 10*3/uL — ABNORMAL HIGH (ref 3.9–10.3)
lymph#: 2.8 10*3/uL (ref 0.9–3.3)
nRBC: 0 % (ref 0–0)

## 2013-01-12 ENCOUNTER — Encounter: Payer: Self-pay | Admitting: Oncology

## 2013-01-12 ENCOUNTER — Telehealth: Payer: Self-pay | Admitting: Oncology

## 2013-01-12 NOTE — Telephone Encounter (Signed)
sw..pt and advised on new appts....pt ok and aware...she will come at nxt visit to pick up updated sched.

## 2013-01-12 NOTE — Progress Notes (Signed)
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 She has tolerated second cycle of chemo better than first, did have neulasta day 2. Cycle 1 was 12-13-12 and cycle 2 01-04-13.  LLE discomfort related to lumbar disc problems improved. She does have PAC  ONCOLOGIC HISTORY  Patient presented in ~ Jan 2014 with complaints of abdominal distension and change in bowels. She had had unremarkable colonoscopy July 2013 (not in this EMR, and patient does not recall which MD). She is post hysterectomy in 1990s. Abdominal symptoms did not improve with interventions for constipation, and CT AP was done in [place] system 11-01-12 with finding of large mid abdominal mass. She was referred to Dr Duard Brady. Preop CA 125 is not available in this EMR; preop CXR had some linear scarring in lingula without acute findings. She went to exploratory laparotomy, bilateral salpingo-oophorectomy, appendectomy, and infacolic omentectomy, which was optimal debulking, by Dr Duard Brady on 11-08-2012. Operative findings: 25 cm right adnexal mass with smooth surface. Surgically absent uterus. Atrophic-appearing left ovary. Within the omentum there were centimeter nodules scattered throughout. The remainder of the surfaces were benign. Her post operative course was unremarkable. She saw Dr Duard Brady in follow up on 11-17-12, with recommendation for 6 cycles of taxol/ carboplatin. She had PAC placed by IR on 12-08-12 and cycle 1 taxol carbo 12-13-12. She is neutropenic now day 15 cycle 1. She did not have significant taxol aches with first cycle.  Patient did not have significant aches with taxol or neulasta. She has felt "fuzzy, languid" since chemo, but no nausea and bowels are moving  3-4x daily,as is her usual. Some smells are unpleasant. She has been drinking fluids, has not needed antiemetics today. She has had no fever or symptoms of infection. The LLE is better with correct positioning. She has not been pushing activity. Blood pressure was elevated with treatments. No peripheral neuropathy complaints. No bleeding. Remainder of 10 point Review of Systems negative.  Objective:  Vital signs in last 24 hours:  BP 138/82  Pulse 105  Temp(Src) 97.6 F (36.4 C) (Oral)  Resp 19  Ht 5' (1.524 m)  Wt 181 lb (82.101 kg)  BMI 35.35 kg/m2 Review of bllood pressures at this office after visit shows that they have been running ~ 150 - 190 systolics and 88-96 diastolic, with diastolics just over 100 on two occasions. We will be back in touch with patient to discuss this. Weight is down one pound. Easily ambulatory, looks comfortable. Alopecia.  HEENT:PERRLA, sclera clear, anicteric and oropharynx clear, no lesions LymphaticsCervical, supraclavicular, and axillary nodes normal. Resp: clear to auscultation bilaterally and normal percussion bilaterally Cardio: regular rate and rhythm Back: spine not tender to palpation GI: soft, nontender, normal bowel sounds. Midline scar closed, no tenderness or surrounding erythema Extremities: extremities normal, atraumatic, no cyanosis or edema Neuro:no sensory deficits noted  Skin without rash or ecchymosis Portacath-without erythema or tenderness  Lab Results:  Results for orders placed in visit on 01/11/13  CBC WITH DIFFERENTIAL      Result Value Range   WBC 12.3 (*) 3.9 - 10.3 10e3/uL   NEUT# 7.4 (*) 1.5 - 6.5 10e3/uL   HGB 13.6  11.6 - 15.9 g/dL   HCT 78.4  69.6 - 29.5 %   Platelets  135 (*) 145 - 400 10e3/uL   MCV 87.5  79.5 - 101.0 fL   MCH 29.2  25.1 - 34.0 pg   MCHC 33.4  31.5 - 36.0 g/dL   RBC 1.61  0.96 - 0.45 10e6/uL   RDW 15.0 (*) 11.2 - 14.5 %   lymph# 2.8  0.9 - 3.3 10e3/uL   MONO# 1.8 (*) 0.1 - 0.9 10e3/uL    Eosinophils Absolute 0.2  0.0 - 0.5 10e3/uL   Basophils Absolute 0.0  0.0 - 0.1 10e3/uL   NEUT% 60.7  38.4 - 76.8 %   LYMPH% 22.8  14.0 - 49.7 %   MONO% 14.3 (*) 0.0 - 14.0 %   EOS% 1.9  0.0 - 7.0 %   BASO% 0.3  0.0 - 2.0 %   nRBC 0  0 - 0 %     Studies/Results:  No results found.  Medications: I have reviewed the patient's current medications. RN to speak with her by phone re antihypertensive from PCP vs starting with maxzide -25 from this office now.  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 Possible mild taxol reaction vs food reaction cycle 1, no similar probems cycle 2. I will see her again with labs on 01-18-13 and she will be due cycle 3 on 01-24-13. 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 lumbar disc disease which appears to be symptomatic with LLE pain now. She will avoid activities that aggravate symptoms and use prn for pain. She is known to Dr Coletta Memos  6.elevated blood pressures, including on days other than chemotherapy. Will have RN speak with her by phone, then either ask Dr Corliss Blacker to address this week, or start Maxzide 25 and let Dr Corliss Blacker know. 7.obesity,sleep apnea, fibromyalgia   Patient is in agreement with plan above.      Reece Packer, MD   01/12/2013, 2:37 PM

## 2013-01-13 ENCOUNTER — Telehealth: Payer: Self-pay

## 2013-01-13 DIAGNOSIS — C569 Malignant neoplasm of unspecified ovary: Secondary | ICD-10-CM

## 2013-01-13 MED ORDER — TRIAMTERENE-HCTZ 37.5-25 MG PO TABS: 1.0000 | ORAL_TABLET | Freq: Every day | ORAL | Status: DC

## 2013-01-13 NOTE — Telephone Encounter (Signed)
    Reece Packer, MD More Detail >>      Reece Packer, MD      Sent: Thu January 12, 2013  2:52 PM    To: Lorine Bears, RN; Phillis Knack, RN        Kristin Webb    MRN: [id number] DOB: [birth year]     Pt Home: [phone]               Message    I have reviewed all of her blood pressure readings at this office and, even given the steroids and stress of chemo, they really are higher than optimal. If she wants to see Dr Corliss Blacker this week, she could help [place], or I would suggest starting maxzide-25 1 in AMs, which is low dose of a mild diuretic with a potassium sparing agent to keep K from dropping. If she prefers me starting the medication, call in #30 with one RF and let Dr Darrell Jewel office know that we have added this.         Cc LA, TH             [name], [name], MD More Detail >>      Reece Packer, MD      Sent: Thu January 12, 2013  3:10 PM    To: Lorine Bears, RN; Phillis Knack, RN        Kristin Webb    MRN: [id number] DOB: [birth year]     Pt Home: [phone]               Message    #2 message today: would be best if she could check blood pressures once or twice daily at home and keep log of the readings.         Cc LA TH

## 2013-01-13 NOTE — Telephone Encounter (Signed)
Spoke with Ms. Kristin Webb regarding prescription for Maxzide which she prefers Dr. Darrold Span to call in and monitor.  Her potassium level was good at 4.0 on 12-27-12 and Dr. Darrold Span said that she did not need to continue with the potassium supplements at this time.  Pt. Verbalized understanding and will keep a log of blood pressures as requested by Dr. Darrold Span. LM for Dr. Gweneth Dimitri that Kristin Webb was stared on the maxzide for elevated blood pressure as requested by Dr. Darrold Span.

## 2013-01-16 ENCOUNTER — Other Ambulatory Visit: Payer: Self-pay | Admitting: Oncology

## 2013-01-18 ENCOUNTER — Encounter: Payer: Self-pay | Admitting: Oncology

## 2013-01-18 ENCOUNTER — Ambulatory Visit: Payer: Medicare Other | Admitting: Oncology

## 2013-01-18 ENCOUNTER — Ambulatory Visit (HOSPITAL_BASED_OUTPATIENT_CLINIC_OR_DEPARTMENT_OTHER): Payer: Medicare Other | Admitting: Oncology

## 2013-01-18 ENCOUNTER — Ambulatory Visit: Payer: Medicare Other

## 2013-01-18 ENCOUNTER — Other Ambulatory Visit (HOSPITAL_BASED_OUTPATIENT_CLINIC_OR_DEPARTMENT_OTHER): Payer: Medicare Other | Admitting: Lab

## 2013-01-18 VITALS — BP 139/89 | HR 103 | Temp 98.9°F | Resp 18 | Ht 60.0 in | Wt 178.6 lb

## 2013-01-18 VITALS — BP 145/81 | HR 106 | Temp 97.6°F | Resp 20

## 2013-01-18 DIAGNOSIS — C5702 Malignant neoplasm of left fallopian tube: Secondary | ICD-10-CM

## 2013-01-18 DIAGNOSIS — C5700 Malignant neoplasm of unspecified fallopian tube: Secondary | ICD-10-CM

## 2013-01-18 DIAGNOSIS — R030 Elevated blood-pressure reading, without diagnosis of hypertension: Secondary | ICD-10-CM

## 2013-01-18 LAB — COMPREHENSIVE METABOLIC PANEL (CC13)
ALT: 23 U/L (ref 0–55)
AST: 15 U/L (ref 5–34)
Albumin: 3.9 g/dL (ref 3.5–5.0)
Alkaline Phosphatase: 143 U/L (ref 40–150)
BUN: 16.8 mg/dL (ref 7.0–26.0)
CO2: 27 mEq/L (ref 22–29)
Calcium: 9.9 mg/dL (ref 8.4–10.4)
Chloride: 103 mEq/L (ref 98–107)
Creatinine: 0.7 mg/dL (ref 0.6–1.1)
Glucose: 110 mg/dl — ABNORMAL HIGH (ref 70–99)
Potassium: 3.6 mEq/L (ref 3.5–5.1)
Sodium: 139 mEq/L (ref 136–145)
Total Bilirubin: 0.28 mg/dL (ref 0.20–1.20)
Total Protein: 7.5 g/dL (ref 6.4–8.3)

## 2013-01-18 LAB — CBC WITH DIFFERENTIAL/PLATELET
BASO%: 0.2 % (ref 0.0–2.0)
Basophils Absolute: 0 10*3/uL (ref 0.0–0.1)
EOS%: 1.3 % (ref 0.0–7.0)
Eosinophils Absolute: 0.1 10*3/uL (ref 0.0–0.5)
HCT: 42.7 % (ref 34.8–46.6)
HGB: 14 g/dL (ref 11.6–15.9)
LYMPH%: 23.7 % (ref 14.0–49.7)
MCH: 28.7 pg (ref 25.1–34.0)
MCHC: 32.8 g/dL (ref 31.5–36.0)
MCV: 87.7 fL (ref 79.5–101.0)
MONO#: 0.8 10*3/uL (ref 0.1–0.9)
MONO%: 8.2 % (ref 0.0–14.0)
NEUT#: 6.4 10*3/uL (ref 1.5–6.5)
NEUT%: 66.6 % (ref 38.4–76.8)
Platelets: 186 10*3/uL (ref 145–400)
RBC: 4.87 10*6/uL (ref 3.70–5.45)
RDW: 15.3 % — ABNORMAL HIGH (ref 11.2–14.5)
WBC: 9.6 10*3/uL (ref 3.9–10.3)
lymph#: 2.3 10*3/uL (ref 0.9–3.3)

## 2013-01-18 MED ORDER — HEPARIN SOD (PORK) LOCK FLUSH 100 UNIT/ML IV SOLN
500.0000 [IU] | Freq: Once | INTRAVENOUS | Status: AC
  Administered 2013-01-18: 500 [IU] via INTRAVENOUS
  Filled 2013-01-18: qty 5

## 2013-01-18 MED ORDER — SODIUM CHLORIDE 0.9 % IJ SOLN
10.0000 mL | INTRAMUSCULAR | Status: DC | PRN
  Administered 2013-01-18: 10 mL via INTRAVENOUS
  Filled 2013-01-18: qty 10

## 2013-01-18 NOTE — Patient Instructions (Signed)
Appointments as scheduled. Decadron 5 tablets with food 12 hours and 6 hours before chemo

## 2013-01-18 NOTE — Progress Notes (Signed)
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 Blood pressures have been much better since beginning Maxzide-25 last week, tolerating this well. She is feeling generally well today.  ONCOLOGIC HISTORY  Patient presented in ~ Jan 2014 with complaints of abdominal distension and change in bowels. She had had unremarkable colonoscopy July 2013 (not in this EMR, and patient does not recall which MD). She is post hysterectomy in 1990s. Abdominal symptoms did not improve with interventions for constipation, and CT AP was done in [place] system 11-01-12 with finding of large mid abdominal mass. She was referred to Dr Duard Brady. Preop CA 125 is not available in this EMR; preop CXR had some linear scarring in lingula without acute findings. She went to exploratory laparotomy, bilateral salpingo-oophorectomy, appendectomy, and infacolic omentectomy, which was optimal debulking, by Dr Duard Brady on 11-08-2012. Operative findings: 25 cm right adnexal mass with smooth surface. Surgically absent uterus. Atrophic-appearing left ovary. Within the omentum there were centimeter nodules scattered throughout. The remainder of the surfaces were benign. 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 Her post operative course was unremarkable. She saw Dr Duard Brady in follow up on 11-17-12, with recommendation for 6 cycles of taxol/ carboplatin. She had PAC placed by IR on 12-08-12 and cycle 1 taxol carbo 12-13-12; she was neutropenic by day 15 cycle 1 and has had gCSF added, as neulasta day 2.   Lumbar disc related LE pain less bothersome with positioning and activity precautions. Bowels ok, no  nausea presently, no fever or symptoms of infection, no new or different pain. Blood pressure at home prior to starting Maxzide-25 167/86 and since then 134-139/ 74-84. Remainder of 10 point Review of Systems negative.  Objective:  Vital signs in last 24 hours:  BP 139/89  Pulse 103  Temp(Src) 98.9 F (37.2 C) (Oral)  Resp 18  Ht 5' (1.524 m)  Wt 178 lb 9.6 oz (81.012 kg)  BMI 34.88 kg/m2 Weight is up 1.5 lbs. Ambulatory without difficulty, looks comfortable   HEENT:PERRLA, sclera clear, anicteric and oropharynx clear, no lesions LymphaticsCervical, supraclavicular, and axillary nodes normal. Resp: clear to auscultation bilaterally and normal percussion bilaterally Cardio: regular rate and rhythm GI: soft, non-tender; bowel sounds normal; no masses,  no organomegaly Extremities: extremities normal, atraumatic, no cyanosis or edema Neuro:no sensory deficits noted Skin without rash or ecchymosis Portacath-without erythema or tenderness   Lab Results:  Results for orders placed in visit on 01/18/13  CBC WITH DIFFERENTIAL      Result Value Range   WBC 9.6  3.9 - 10.3 10e3/uL   NEUT# 6.4  1.5 - 6.5 10e3/uL   HGB 14.0  11.6 - 15.9 g/dL   HCT 16.1  09.6 - 04.5 %   Platelets 186  145 - 400 10e3/uL   MCV 87.7  79.5 - 101.0 fL   MCH 28.7  25.1 - 34.0 pg   MCHC 32.8  31.5 - 36.0 g/dL   RBC 4.09  8.11 - 9.14 10e6/uL   RDW 15.3 (*) 11.2 - 14.5 %   lymph# 2.3  0.9 - 3.3 10e3/uL   MONO# 0.8  0.1 - 0.9 10e3/uL   Eosinophils Absolute 0.1  0.0 - 0.5 10e3/uL  Basophils Absolute 0.0  0.0 - 0.1 10e3/uL   NEUT% 66.6  38.4 - 76.8 %   LYMPH% 23.7  14.0 - 49.7 %   MONO% 8.2  0.0 - 14.0 %   EOS% 1.3  0.0 - 7.0 %   BASO% 0.2  0.0 - 2.0 %  COMPREHENSIVE METABOLIC PANEL (CC13)      Result Value Range   Sodium 139  136 - 145 mEq/L   Potassium 3.6  3.5 - 5.1 mEq/L   Chloride 103  98 - 107 mEq/L   CO2 27  22 - 29 mEq/L   Glucose 110 (*) 70 - 99 mg/dl   BUN 78.4  7.0 - 69.6 mg/dL    Creatinine 0.7  0.6 - 1.1 mg/dL   Total Bilirubin 2.95  0.20 - 1.20 mg/dL   Alkaline Phosphatase 143  40 - 150 U/L   AST 15  5 - 34 U/L   ALT 23  0 - 55 U/L   Total Protein 7.5  6.4 - 8.3 g/dL   Albumin 3.9  3.5 - 5.0 g/dL   Calcium 9.9  8.4 - 28.4 mg/dL    CA 132 available after visit 4.9  CBC shows counts past nadir and should be good for cycle 3 on schedule 01-24-13 Studies/Results:  No results found.  Medications: I have reviewed the patient's current medications. She will continue maxzide 25 one q AM. Chemotherapy orders for cycle 3 completed using renal function from labs today.  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 She will be seen back ~ 7-1 prior to cycle 4 on 02-14-13. Plan is 6 cycles then repeat scans prior to return visit to Dr Duard Brady (not yet scheduled). 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 lumbar disc disease symptomatic with LLE pain: continue to avoid activities that aggravate symptoms and use prn for pain. She is known to Dr Coletta Memos  6.elevated blood pressures seem better with addition of maxzide 25 last week. Will follow at home and with visits here. 7.obesity,sleep apnea, fibromyalgia   Patient is in agreement with plan above.  Reece Packer, MD   01/18/2013, 2:48 PM

## 2013-01-19 ENCOUNTER — Telehealth: Payer: Self-pay | Admitting: Oncology

## 2013-01-19 LAB — CA 125: CA 125: 4.9 U/mL (ref 0.0–30.2)

## 2013-01-19 NOTE — Telephone Encounter (Signed)
s.w. pt and advised on 6.18.14 inj....pt ok and aware

## 2013-01-20 ENCOUNTER — Telehealth: Payer: Self-pay | Admitting: Neurology

## 2013-01-24 ENCOUNTER — Other Ambulatory Visit: Payer: Medicare Other | Admitting: Lab

## 2013-01-24 ENCOUNTER — Other Ambulatory Visit: Payer: Self-pay | Admitting: Oncology

## 2013-01-24 ENCOUNTER — Other Ambulatory Visit (HOSPITAL_BASED_OUTPATIENT_CLINIC_OR_DEPARTMENT_OTHER): Payer: Medicare Other | Admitting: Lab

## 2013-01-24 ENCOUNTER — Ambulatory Visit (HOSPITAL_BASED_OUTPATIENT_CLINIC_OR_DEPARTMENT_OTHER): Payer: Medicare Other

## 2013-01-24 VITALS — BP 139/78 | HR 89 | Temp 97.2°F | Resp 20

## 2013-01-24 DIAGNOSIS — C5700 Malignant neoplasm of unspecified fallopian tube: Secondary | ICD-10-CM

## 2013-01-24 DIAGNOSIS — C5702 Malignant neoplasm of left fallopian tube: Secondary | ICD-10-CM

## 2013-01-24 DIAGNOSIS — Z5111 Encounter for antineoplastic chemotherapy: Secondary | ICD-10-CM

## 2013-01-24 LAB — CBC WITH DIFFERENTIAL/PLATELET
BASO%: 0.1 % (ref 0.0–2.0)
Basophils Absolute: 0 10*3/uL (ref 0.0–0.1)
EOS%: 0 % (ref 0.0–7.0)
Eosinophils Absolute: 0 10*3/uL (ref 0.0–0.5)
HCT: 42.2 % (ref 34.8–46.6)
HGB: 14 g/dL (ref 11.6–15.9)
LYMPH%: 9.9 % — ABNORMAL LOW (ref 14.0–49.7)
MCH: 29.2 pg (ref 25.1–34.0)
MCHC: 33.2 g/dL (ref 31.5–36.0)
MCV: 88.1 fL (ref 79.5–101.0)
MONO#: 0 10*3/uL — ABNORMAL LOW (ref 0.1–0.9)
MONO%: 0.3 % (ref 0.0–14.0)
NEUT#: 6.6 10*3/uL — ABNORMAL HIGH (ref 1.5–6.5)
NEUT%: 89.7 % — ABNORMAL HIGH (ref 38.4–76.8)
Platelets: 235 10*3/uL (ref 145–400)
RBC: 4.79 10*6/uL (ref 3.70–5.45)
RDW: 15.3 % — ABNORMAL HIGH (ref 11.2–14.5)
WBC: 7.4 10*3/uL (ref 3.9–10.3)
lymph#: 0.7 10*3/uL — ABNORMAL LOW (ref 0.9–3.3)
nRBC: 0 % (ref 0–0)

## 2013-01-24 MED ORDER — SODIUM CHLORIDE 0.9 % IJ SOLN
10.0000 mL | INTRAMUSCULAR | Status: DC | PRN
  Administered 2013-01-24: 10 mL
  Filled 2013-01-24: qty 10

## 2013-01-24 MED ORDER — SODIUM CHLORIDE 0.9 % IV SOLN
Freq: Once | INTRAVENOUS | Status: AC
  Administered 2013-01-24: 10:00:00 via INTRAVENOUS

## 2013-01-24 MED ORDER — FAMOTIDINE IN NACL 20-0.9 MG/50ML-% IV SOLN
20.0000 mg | Freq: Once | INTRAVENOUS | Status: AC
  Administered 2013-01-24: 20 mg via INTRAVENOUS

## 2013-01-24 MED ORDER — ONDANSETRON 16 MG/50ML IVPB (CHCC)
16.0000 mg | Freq: Once | INTRAVENOUS | Status: AC
  Administered 2013-01-24: 16 mg via INTRAVENOUS

## 2013-01-24 MED ORDER — DIPHENHYDRAMINE HCL 50 MG/ML IJ SOLN
50.0000 mg | Freq: Once | INTRAMUSCULAR | Status: AC
  Administered 2013-01-24: 50 mg via INTRAVENOUS

## 2013-01-24 MED ORDER — SODIUM CHLORIDE 0.9 % IV SOLN
135.0000 mg/m2 | Freq: Once | INTRAVENOUS | Status: AC
  Administered 2013-01-24: 246 mg via INTRAVENOUS
  Filled 2013-01-24: qty 41

## 2013-01-24 MED ORDER — DEXAMETHASONE SODIUM PHOSPHATE 20 MG/5ML IJ SOLN
20.0000 mg | Freq: Once | INTRAMUSCULAR | Status: AC
  Administered 2013-01-24: 20 mg via INTRAVENOUS

## 2013-01-24 MED ORDER — SODIUM CHLORIDE 0.9 % IV SOLN
620.0000 mg | Freq: Once | INTRAVENOUS | Status: AC
  Administered 2013-01-24: 620 mg via INTRAVENOUS
  Filled 2013-01-24: qty 62

## 2013-01-24 MED ORDER — HEPARIN SOD (PORK) LOCK FLUSH 100 UNIT/ML IV SOLN
500.0000 [IU] | Freq: Once | INTRAVENOUS | Status: AC | PRN
  Administered 2013-01-24: 500 [IU]
  Filled 2013-01-24: qty 5

## 2013-01-24 NOTE — Patient Instructions (Addendum)
[facility] Discharge Instructions for Patients Receiving Chemotherapy  Today you received the following chemotherapy agents Taxol and Carboplatin.  To help prevent nausea and vomiting after your treatment, we encourage you to take your nausea medication.   If you develop nausea and vomiting that is not controlled by your nausea medication, call the clinic.   BELOW ARE SYMPTOMS THAT SHOULD BE REPORTED IMMEDIATELY:  *FEVER GREATER THAN 100.5 F  *CHILLS WITH OR WITHOUT FEVER  NAUSEA AND VOMITING THAT IS NOT CONTROLLED WITH YOUR NAUSEA MEDICATION  *UNUSUAL SHORTNESS OF BREATH  *UNUSUAL BRUISING OR BLEEDING  TENDERNESS IN MOUTH AND THROAT WITH OR WITHOUT PRESENCE OF ULCERS  *URINARY PROBLEMS  *BOWEL PROBLEMS  UNUSUAL RASH Items with * indicate a potential emergency and should be followed up as soon as possible.  Feel free to call the clinic you have any questions or concerns. The clinic phone number is [phone].    

## 2013-01-25 ENCOUNTER — Ambulatory Visit (HOSPITAL_BASED_OUTPATIENT_CLINIC_OR_DEPARTMENT_OTHER): Payer: Medicare Other

## 2013-01-25 ENCOUNTER — Telehealth: Payer: Self-pay | Admitting: Neurology

## 2013-01-25 VITALS — BP 174/91 | HR 92 | Temp 97.7°F

## 2013-01-25 DIAGNOSIS — C5702 Malignant neoplasm of left fallopian tube: Secondary | ICD-10-CM

## 2013-01-25 DIAGNOSIS — C5700 Malignant neoplasm of unspecified fallopian tube: Secondary | ICD-10-CM

## 2013-01-25 MED ORDER — PEGFILGRASTIM INJECTION 6 MG/0.6ML
6.0000 mg | Freq: Once | SUBCUTANEOUS | Status: AC
  Administered 2013-01-25: 6 mg via SUBCUTANEOUS
  Filled 2013-01-25: qty 0.6

## 2013-01-25 NOTE — Telephone Encounter (Signed)
Please get this through triage , which meds at which dose does she want to stretch.

## 2013-01-26 ENCOUNTER — Telehealth: Payer: Self-pay | Admitting: Neurology

## 2013-01-26 NOTE — Telephone Encounter (Signed)
From previous contact: The pt has not taken her medication for 2 months. Pt wants to change the dosage on either / or both of her medications Lunesta and Nuvigil  ". . . so that she can get more out her medications."  I called to clarify which meds the pt has not been taking and what symptoms she is trying to relieve by adjusting these 2 meds.  I spoke briefly with Mr Frame; he was not familiar with the specifics of his wife's request and stated that he would have her call us back.

## 2013-01-26 NOTE — Telephone Encounter (Signed)
Got clarification from pt regarding her original call:  She has reached the "donut hole" with regards to [place] and Nuvigil (only); these medications now cost more than half her monthly income.  Are there less expensive alternatives in different medications or in adjusting these medications - what are her alternatives?  The pt has also had a recent diagnosis that may affect the treatment she is currently receiving from Dr Vickey Huger and would like to speak to Dr Vickey Huger about this.  Please call.  Her current Rx for Nuvigil is: 0.5 tablet (125 mg) in the AM and another 0.5 tablet in 2 hrs. Her current Rx for Alfonso Patten is: 1 tablet (1 mg) PO Hs.

## 2013-01-26 NOTE — Telephone Encounter (Signed)
Patient may pick up NUVIGIL samples, unfortunately the medication is no covered by her insurance, and neither will the generic form  . She may pick up 14 pills any time. I left Voicemail.

## 2013-01-28 ENCOUNTER — Other Ambulatory Visit: Payer: Self-pay | Admitting: Neurology

## 2013-01-28 ENCOUNTER — Telehealth: Payer: Self-pay | Admitting: Neurology

## 2013-01-28 DIAGNOSIS — G4710 Hypersomnia, unspecified: Secondary | ICD-10-CM

## 2013-01-28 MED ORDER — MODAFINIL 200 MG PO TABS: 200.0000 mg | ORAL_TABLET | Freq: Every day | ORAL | Status: DC

## 2013-01-28 NOTE — Telephone Encounter (Signed)
I have forwarded this to triage on Thursday and given Shanda Bumps B a brown bag with samples of nuvigil for this patient on Thursday. She will try to get the best effect with the lowest dose. I will see if a modafinil generic is cheaper for her ( its not covered by insurance ) . The bag includes a Lunesta coupon card and there a some on the internet for reduced prices.  Monica : Please send these to TRIAGE , and not to the DOCTOR , THIS IS WHY WE HAVE TRIAGE . I have documented that the phone calls were unanswered and left a VM , Brett Canales has called this patient to get exact doses and meds ,  I appreciated that  very much. Audel Coakley, MD

## 2013-02-07 ENCOUNTER — Telehealth: Payer: Self-pay | Admitting: *Deleted

## 2013-02-07 ENCOUNTER — Other Ambulatory Visit: Payer: Self-pay | Admitting: *Deleted

## 2013-02-07 ENCOUNTER — Other Ambulatory Visit (HOSPITAL_BASED_OUTPATIENT_CLINIC_OR_DEPARTMENT_OTHER): Payer: Medicare Other | Admitting: Lab

## 2013-02-07 ENCOUNTER — Telehealth: Payer: Self-pay | Admitting: Oncology

## 2013-02-07 ENCOUNTER — Ambulatory Visit (HOSPITAL_BASED_OUTPATIENT_CLINIC_OR_DEPARTMENT_OTHER): Payer: Medicare Other | Admitting: Oncology

## 2013-02-07 ENCOUNTER — Ambulatory Visit (HOSPITAL_BASED_OUTPATIENT_CLINIC_OR_DEPARTMENT_OTHER): Payer: Medicare Other

## 2013-02-07 ENCOUNTER — Encounter: Payer: Self-pay | Admitting: Oncology

## 2013-02-07 VITALS — BP 132/83 | HR 90 | Temp 98.5°F | Resp 20 | Ht 60.0 in | Wt 182.6 lb

## 2013-02-07 DIAGNOSIS — C5700 Malignant neoplasm of unspecified fallopian tube: Secondary | ICD-10-CM

## 2013-02-07 DIAGNOSIS — C5701 Malignant neoplasm of right fallopian tube: Secondary | ICD-10-CM

## 2013-02-07 DIAGNOSIS — C569 Malignant neoplasm of unspecified ovary: Secondary | ICD-10-CM

## 2013-02-07 LAB — CBC WITH DIFFERENTIAL/PLATELET
BASO%: 1.1 % (ref 0.0–2.0)
Basophils Absolute: 0.1 10*3/uL (ref 0.0–0.1)
EOS%: 0.7 % (ref 0.0–7.0)
Eosinophils Absolute: 0.1 10*3/uL (ref 0.0–0.5)
HCT: 38.1 % (ref 34.8–46.6)
HGB: 13 g/dL (ref 11.6–15.9)
LYMPH%: 23.4 % (ref 14.0–49.7)
MCH: 30 pg (ref 25.1–34.0)
MCHC: 34 g/dL (ref 31.5–36.0)
MCV: 88.1 fL (ref 79.5–101.0)
MONO#: 0.9 10*3/uL (ref 0.1–0.9)
MONO%: 8.8 % (ref 0.0–14.0)
NEUT#: 6.8 10*3/uL — ABNORMAL HIGH (ref 1.5–6.5)
NEUT%: 66 % (ref 38.4–76.8)
Platelets: 137 10*3/uL — ABNORMAL LOW (ref 145–400)
RBC: 4.33 10*6/uL (ref 3.70–5.45)
RDW: 16.9 % — ABNORMAL HIGH (ref 11.2–14.5)
WBC: 10.3 10*3/uL (ref 3.9–10.3)
lymph#: 2.4 10*3/uL (ref 0.9–3.3)

## 2013-02-07 LAB — COMPREHENSIVE METABOLIC PANEL (CC13)
ALT: 38 U/L (ref 0–55)
AST: 25 U/L (ref 5–34)
Albumin: 3.7 g/dL (ref 3.5–5.0)
Alkaline Phosphatase: 122 U/L (ref 40–150)
BUN: 12.1 mg/dL (ref 7.0–26.0)
CO2: 26 mEq/L (ref 22–29)
Calcium: 9.5 mg/dL (ref 8.4–10.4)
Chloride: 105 mEq/L (ref 98–109)
Creatinine: 0.7 mg/dL (ref 0.6–1.1)
Glucose: 88 mg/dl (ref 70–140)
Potassium: 3.9 mEq/L (ref 3.5–5.1)
Sodium: 141 mEq/L (ref 136–145)
Total Bilirubin: 0.26 mg/dL (ref 0.20–1.20)
Total Protein: 7.1 g/dL (ref 6.4–8.3)

## 2013-02-07 MED ORDER — HEPARIN SOD (PORK) LOCK FLUSH 100 UNIT/ML IV SOLN
500.0000 [IU] | Freq: Once | INTRAVENOUS | Status: AC
  Administered 2013-02-07: 500 [IU] via INTRAVENOUS
  Filled 2013-02-07: qty 5

## 2013-02-07 MED ORDER — DEXAMETHASONE 4 MG PO TABS: ORAL_TABLET | ORAL | Status: DC

## 2013-02-07 MED ORDER — SODIUM CHLORIDE 0.9 % IJ SOLN
10.0000 mL | INTRAMUSCULAR | Status: DC | PRN
  Administered 2013-02-07: 10 mL via INTRAVENOUS
  Filled 2013-02-07: qty 10

## 2013-02-07 MED ORDER — TRIAMTERENE-HCTZ 37.5-25 MG PO TABS: 1.0000 | ORAL_TABLET | Freq: Every day | ORAL | Status: DC

## 2013-02-07 NOTE — Telephone Encounter (Signed)
Per staff phone call and POF I have schedueld appts.  JMW  

## 2013-02-07 NOTE — Progress Notes (Signed)
Patient came in with bill to check on 12/27/12 dos that was denied --lab. I advised he would send to billing. I also signed her up for Neulasta co pay asst and I sent eob to them fo 01/05/13 date of service. She said ok to leave message once billing advised about bills and if 60.00 for 12/27/12 and 5/21/4 is her balance.

## 2013-02-07 NOTE — Progress Notes (Signed)
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 She had constipation without impaction after cycle 3 given on 01-24-13, now resolved, and no peripheral neuropathy symptoms. Taxol aches are tolerable now. She has PAC.   ONCOLOGIC HISTORY  Patient presented in ~ Jan 2014 with complaints of abdominal distension and change in bowels. She had had unremarkable colonoscopy July 2013 (not in this EMR, and patient does not recall which MD). She is post hysterectomy in 1990s. Abdominal symptoms did not improve with interventions for constipation, and CT AP was done in [place] system 11-01-12 with finding of large mid abdominal mass. She was referred to Dr Duard Brady. Preop CA 125 is not available in this EMR; preop CXR had some linear scarring in lingula without acute findings. She went to exploratory laparotomy, bilateral salpingo-oophorectomy, appendectomy, and infacolic omentectomy, which was optimal debulking, by Dr Duard Brady on 11-08-2012. Operative findings: 25 cm right adnexal mass with smooth surface. Surgically absent uterus. Atrophic-appearing left ovary. Within the omentum there were centimeter nodules scattered throughout. The remainder of the surfaces were benign. 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 Her post operative course was unremarkable. She saw Dr Duard Brady in follow up on 11-17-12, with recommendation for 6 cycles of taxol/ carboplatin. She had PAC placed by IR on 12-08-12 and cycle 1 taxol carbo 12-13-12; she was neutropenic by day 15 cycle 1 and has had   neulasta day 2 with subsequent cycles.   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 She has slightly pruritic scalp irritation. Constipation day 4 last cycle, seen at urgent care, no stool impaction, resolved with SenokotS + fruit and moving as usual 1-2x daily since then. Only nausea was with the constipation. Aches with taxol/ neulasta have resolved. No peripheral neuropathy symptoms. Back/ leg pain from degenerative disc stable. No fever or symptoms of infection. No problems with PAC. Remainder of 10 point Review of Systems negative.  Objective:  Vital signs in last 24 hours:  BP 132/83  Pulse 90  Temp(Src) 98.5 F (36.9 C) (Oral)  Resp 20  Ht 5' (1.524 m)  Wt 182 lb 9.6 oz (82.827 kg)  BMI 35.66 kg/m2  Weight is up 4 lbs. Awake, alert, ambulatory without assistance. Alopecia.  HEENT:PERRLA, sclera clear, anicteric and oropharynx clear, no lesions LymphaticsCervical, supraclavicular, and axillary nodes normal. Resp: clear to auscultation bilaterally and normal percussion bilaterally Cardio: regular rate and rhythm GI: soft, nontender, few bowel sounds, not distended, no appreciable HSM or mass, surgicl incision not remarkable Extremities: extremities normal, atraumatic, no cyanosis or edema Neuro:no sensory deficits noted Conversation fluent and appropriate Breast:normal without suspicious masses, skin or nipple changes or axillary nodes Portacath-without erythema or tenderness  Lab Results:  Results for orders placed in visit on 02/07/13  CBC WITH DIFFERENTIAL      Result Value Range   WBC 10.3  3.9 - 10.3 10e3/uL   NEUT# 6.8 (*)  1.5 - 6.5 10e3/uL   HGB 13.0  11.6 - 15.9 g/dL   HCT 30.8  65.7 - 84.6 %   Platelets 137 (*) 145 - 400 10e3/uL   MCV 88.1  79.5 -  101.0 fL   MCH 30.0  25.1 - 34.0 pg   MCHC 34.0  31.5 - 36.0 g/dL   RBC 9.62  9.52 - 8.41 10e6/uL   RDW 16.9 (*) 11.2 - 14.5 %   lymph# 2.4  0.9 - 3.3 10e3/uL   MONO# 0.9  0.1 - 0.9 10e3/uL   Eosinophils Absolute 0.1  0.0 - 0.5 10e3/uL   Basophils Absolute 0.1  0.0 - 0.1 10e3/uL   NEUT% 66.0  38.4 - 76.8 %   LYMPH% 23.4  14.0 - 49.7 %   MONO% 8.8  0.0 - 14.0 %   EOS% 0.7  0.0 - 7.0 %   BASO% 1.1  0.0 - 2.0 %    CA 125 available after visit 5.0; I do not have record of preop CA 125.  CMET available after visit entirely normal, including K 3.9, creat 0.7   Studies/Results:  No results found.  Medications: I have reviewed the patient's current medications. I have reviewed information on Nuvigil and discussed with pharmacist, no contraindication for use with this chemo (tho she will not take at least for ~ 3 days around each treatment).  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 She will see medical oncology provider on ~ July 22 prior to cycle 5 on 7-29 and neulasta 03-08-13.Plan is 6 cycles then repeat scans prior to return visit to Dr Duard Brady (not yet scheduled).  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 lumbar disc disease symptomatic with LLE pain: continue to avoid activities that aggravate symptoms and use prn for pain. She is known to Dr Coletta Memos  6.elevated blood pressures better with addition of maxzide 25. Will follow at home and with visits here.  7.obesity,sleep apnea, narcolepsy- type disorder, fibromyalgia. Resume Nuvigil as she discussed with Dr Vickey Huger.     [name],LENNIS P, MD   02/07/2013, 1:21 PM

## 2013-02-07 NOTE — Telephone Encounter (Signed)
gv and printed appt sched and avs for pt....MW added tx.Marland KitchenMarland KitchenMarland KitchenPt to see Dr. Duard Brady 9.25.14 @ 9:15am...will call pt with d.t. of MD appt on 7.23.14

## 2013-02-08 LAB — CA 125: CA 125: 5 U/mL (ref 0.0–30.2)

## 2013-02-13 ENCOUNTER — Telehealth: Payer: Self-pay | Admitting: Oncology

## 2013-02-13 NOTE — Telephone Encounter (Signed)
lvm for pt regarding to 7.23.14 appt being move to 7.22.14....mailed pt appt sched/avs and letter

## 2013-02-14 ENCOUNTER — Other Ambulatory Visit: Payer: Medicare Other | Admitting: Lab

## 2013-02-14 ENCOUNTER — Other Ambulatory Visit (HOSPITAL_BASED_OUTPATIENT_CLINIC_OR_DEPARTMENT_OTHER): Payer: Medicare Other | Admitting: Lab

## 2013-02-14 ENCOUNTER — Ambulatory Visit (HOSPITAL_BASED_OUTPATIENT_CLINIC_OR_DEPARTMENT_OTHER): Payer: Medicare Other

## 2013-02-14 VITALS — BP 152/70 | HR 80 | Temp 98.0°F

## 2013-02-14 DIAGNOSIS — C5700 Malignant neoplasm of unspecified fallopian tube: Secondary | ICD-10-CM

## 2013-02-14 DIAGNOSIS — Z5111 Encounter for antineoplastic chemotherapy: Secondary | ICD-10-CM

## 2013-02-14 DIAGNOSIS — C5701 Malignant neoplasm of right fallopian tube: Secondary | ICD-10-CM

## 2013-02-14 DIAGNOSIS — C5702 Malignant neoplasm of left fallopian tube: Secondary | ICD-10-CM

## 2013-02-14 LAB — CBC WITH DIFFERENTIAL/PLATELET
BASO%: 0.1 % (ref 0.0–2.0)
Basophils Absolute: 0 10*3/uL (ref 0.0–0.1)
EOS%: 0 % (ref 0.0–7.0)
Eosinophils Absolute: 0 10*3/uL (ref 0.0–0.5)
HCT: 40.1 % (ref 34.8–46.6)
HGB: 13.3 g/dL (ref 11.6–15.9)
LYMPH%: 11.8 % — ABNORMAL LOW (ref 14.0–49.7)
MCH: 30 pg (ref 25.1–34.0)
MCHC: 33.2 g/dL (ref 31.5–36.0)
MCV: 90.5 fL (ref 79.5–101.0)
MONO#: 0.2 10*3/uL (ref 0.1–0.9)
MONO%: 3.3 % (ref 0.0–14.0)
NEUT#: 6.2 10*3/uL (ref 1.5–6.5)
NEUT%: 84.8 % — ABNORMAL HIGH (ref 38.4–76.8)
Platelets: 187 10*3/uL (ref 145–400)
RBC: 4.43 10*6/uL (ref 3.70–5.45)
RDW: 16.4 % — ABNORMAL HIGH (ref 11.2–14.5)
WBC: 7.3 10*3/uL (ref 3.9–10.3)
lymph#: 0.9 10*3/uL (ref 0.9–3.3)
nRBC: 0 % (ref 0–0)

## 2013-02-14 MED ORDER — HEPARIN SOD (PORK) LOCK FLUSH 100 UNIT/ML IV SOLN
500.0000 [IU] | Freq: Once | INTRAVENOUS | Status: AC | PRN
  Administered 2013-02-14: 500 [IU]
  Filled 2013-02-14: qty 5

## 2013-02-14 MED ORDER — DEXAMETHASONE SODIUM PHOSPHATE 20 MG/5ML IJ SOLN
20.0000 mg | Freq: Once | INTRAMUSCULAR | Status: AC
  Administered 2013-02-14: 20 mg via INTRAVENOUS

## 2013-02-14 MED ORDER — SODIUM CHLORIDE 0.9 % IV SOLN
620.0000 mg | Freq: Once | INTRAVENOUS | Status: AC
  Administered 2013-02-14: 620 mg via INTRAVENOUS
  Filled 2013-02-14: qty 62

## 2013-02-14 MED ORDER — FAMOTIDINE IN NACL 20-0.9 MG/50ML-% IV SOLN
20.0000 mg | Freq: Once | INTRAVENOUS | Status: AC
  Administered 2013-02-14: 20 mg via INTRAVENOUS

## 2013-02-14 MED ORDER — DIPHENHYDRAMINE HCL 50 MG/ML IJ SOLN
50.0000 mg | Freq: Once | INTRAMUSCULAR | Status: AC
  Administered 2013-02-14: 50 mg via INTRAVENOUS

## 2013-02-14 MED ORDER — ONDANSETRON 16 MG/50ML IVPB (CHCC)
16.0000 mg | Freq: Once | INTRAVENOUS | Status: AC
  Administered 2013-02-14: 16 mg via INTRAVENOUS

## 2013-02-14 MED ORDER — SODIUM CHLORIDE 0.9 % IJ SOLN
10.0000 mL | INTRAMUSCULAR | Status: DC | PRN
  Administered 2013-02-14: 10 mL
  Filled 2013-02-14: qty 10

## 2013-02-14 MED ORDER — SODIUM CHLORIDE 0.9 % IV SOLN
Freq: Once | INTRAVENOUS | Status: AC
  Administered 2013-02-14: 10:00:00 via INTRAVENOUS

## 2013-02-14 MED ORDER — SODIUM CHLORIDE 0.9 % IV SOLN
135.0000 mg/m2 | Freq: Once | INTRAVENOUS | Status: AC
  Administered 2013-02-14: 246 mg via INTRAVENOUS
  Filled 2013-02-14: qty 41

## 2013-02-14 NOTE — Patient Instructions (Addendum)
[facility] Discharge Instructions for Patients Receiving Chemotherapy  Today you received the following chemotherapy agents taxol, carboplatin  To help prevent nausea and vomiting after your treatment, we encourage you to take your nausea medications as needed   If you develop nausea and vomiting that is not controlled by your nausea medication, call the clinic.   BELOW ARE SYMPTOMS THAT SHOULD BE REPORTED IMMEDIATELY:  *FEVER GREATER THAN 100.5 F  *CHILLS WITH OR WITHOUT FEVER  NAUSEA AND VOMITING THAT IS NOT CONTROLLED WITH YOUR NAUSEA MEDICATION  *UNUSUAL SHORTNESS OF BREATH  *UNUSUAL BRUISING OR BLEEDING  TENDERNESS IN MOUTH AND THROAT WITH OR WITHOUT PRESENCE OF ULCERS  *URINARY PROBLEMS  *BOWEL PROBLEMS  UNUSUAL RASH Items with * indicate a potential emergency and should be followed up as soon as possible.  Feel free to call the clinic you have any questions or concerns. The clinic phone number is [phone].

## 2013-02-15 ENCOUNTER — Encounter (HOSPITAL_BASED_OUTPATIENT_CLINIC_OR_DEPARTMENT_OTHER): Payer: Medicare Other

## 2013-02-15 ENCOUNTER — Other Ambulatory Visit: Payer: Self-pay | Admitting: Hematology and Oncology

## 2013-02-15 VITALS — BP 140/74 | HR 95 | Temp 97.5°F

## 2013-02-15 DIAGNOSIS — C5702 Malignant neoplasm of left fallopian tube: Secondary | ICD-10-CM

## 2013-02-15 DIAGNOSIS — Z5189 Encounter for other specified aftercare: Secondary | ICD-10-CM

## 2013-02-15 DIAGNOSIS — C5700 Malignant neoplasm of unspecified fallopian tube: Secondary | ICD-10-CM

## 2013-02-15 DIAGNOSIS — C5701 Malignant neoplasm of right fallopian tube: Secondary | ICD-10-CM

## 2013-02-15 MED ORDER — PEGFILGRASTIM INJECTION 6 MG/0.6ML
6.0000 mg | Freq: Once | SUBCUTANEOUS | Status: AC
  Administered 2013-02-15: 6 mg via SUBCUTANEOUS

## 2013-02-15 NOTE — Addendum Note (Signed)
Addended by: Konrad Penta on: 02/15/2013 11:14 AM   Modules accepted: Orders

## 2013-02-28 ENCOUNTER — Other Ambulatory Visit (HOSPITAL_BASED_OUTPATIENT_CLINIC_OR_DEPARTMENT_OTHER): Payer: Medicare Other | Admitting: Lab

## 2013-02-28 ENCOUNTER — Ambulatory Visit (HOSPITAL_BASED_OUTPATIENT_CLINIC_OR_DEPARTMENT_OTHER): Payer: Medicare Other | Admitting: Hematology and Oncology

## 2013-02-28 VITALS — BP 131/89 | HR 90 | Temp 96.9°F | Resp 19 | Ht 60.0 in | Wt 183.5 lb

## 2013-02-28 DIAGNOSIS — C569 Malignant neoplasm of unspecified ovary: Secondary | ICD-10-CM

## 2013-02-28 DIAGNOSIS — C5700 Malignant neoplasm of unspecified fallopian tube: Secondary | ICD-10-CM

## 2013-02-28 DIAGNOSIS — C5701 Malignant neoplasm of right fallopian tube: Secondary | ICD-10-CM

## 2013-02-28 LAB — COMPREHENSIVE METABOLIC PANEL (CC13)
ALT: 27 U/L (ref 0–55)
AST: 17 U/L (ref 5–34)
Albumin: 3.8 g/dL (ref 3.5–5.0)
Alkaline Phosphatase: 135 U/L (ref 40–150)
BUN: 16.7 mg/dL (ref 7.0–26.0)
CO2: 29 mEq/L (ref 22–29)
Calcium: 10 mg/dL (ref 8.4–10.4)
Chloride: 103 mEq/L (ref 98–109)
Creatinine: 0.7 mg/dL (ref 0.6–1.1)
Glucose: 115 mg/dl (ref 70–140)
Potassium: 3.6 mEq/L (ref 3.5–5.1)
Sodium: 143 mEq/L (ref 136–145)
Total Bilirubin: 0.24 mg/dL (ref 0.20–1.20)
Total Protein: 7.1 g/dL (ref 6.4–8.3)

## 2013-02-28 LAB — CBC WITH DIFFERENTIAL/PLATELET
BASO%: 0.5 % (ref 0.0–2.0)
Basophils Absolute: 0 10*3/uL (ref 0.0–0.1)
EOS%: 0.9 % (ref 0.0–7.0)
Eosinophils Absolute: 0.1 10*3/uL (ref 0.0–0.5)
HCT: 38.3 % (ref 34.8–46.6)
HGB: 12.6 g/dL (ref 11.6–15.9)
LYMPH%: 21.5 % (ref 14.0–49.7)
MCH: 30.6 pg (ref 25.1–34.0)
MCHC: 32.9 g/dL (ref 31.5–36.0)
MCV: 93 fL (ref 79.5–101.0)
MONO#: 0.6 10*3/uL (ref 0.1–0.9)
MONO%: 7.1 % (ref 0.0–14.0)
NEUT#: 6 10*3/uL (ref 1.5–6.5)
NEUT%: 70 % (ref 38.4–76.8)
Platelets: 128 10*3/uL — ABNORMAL LOW (ref 145–400)
RBC: 4.12 10*6/uL (ref 3.70–5.45)
RDW: 16.4 % — ABNORMAL HIGH (ref 11.2–14.5)
WBC: 8.6 10*3/uL (ref 3.9–10.3)
lymph#: 1.9 10*3/uL (ref 0.9–3.3)
nRBC: 0 % (ref 0–0)

## 2013-02-28 NOTE — Progress Notes (Signed)
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 She had constipation without impaction after cycle 3 given on 01-24-13, now resolved, and no peripheral neuropathy symptoms. Taxol aches are tolerable now. She has PAC.   ONCOLOGIC HISTORY  Patient presented in ~ Jan 2014 with complaints of abdominal distension and change in bowels. She had had unremarkable colonoscopy July 2013 (not in this EMR, and patient does not recall which MD). She is post hysterectomy in 1990s. Abdominal symptoms did not improve with interventions for constipation, and CT AP was done in [place] system 11-01-12 with finding of large mid abdominal mass. She was referred to Dr Duard Brady. Preop CA 125 is not available in this EMR; preop CXR had some linear scarring in lingula without acute findings. She went to exploratory laparotomy, bilateral salpingo-oophorectomy, appendectomy, and infacolic omentectomy, which was optimal debulking, by Dr Duard Brady on 11-08-2012. Operative findings: 25 cm right adnexal mass with smooth surface. Surgically absent uterus. Atrophic-appearing left ovary. Within the omentum there were centimeter nodules scattered throughout. The remainder of the surfaces were benign. 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 Her post operative course was unremarkable. She saw Dr Duard Brady in follow up on 11-17-12, with recommendation for 6 cycles of taxol/ carboplatin. She had PAC placed by IR on 12-08-12 and cycle 1 taxol carbo 12-13-12; she was neutropenic by day 15 cycle 1 and has had   neulasta day 2 with subsequent cycles.   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 She has slightly pruritic scalp irritation. Only nausea was with the constipation. Aches with taxol/ neulasta have resolved. No peripheral neuropathy symptoms. Back/ leg pain from degenerative disc stable. No fever or symptoms of infection. No problems with PAC. Remainder of 10 point Review of Systems negative.  Objective:  Vital signs in last 24 hours:  BP 131/89  Pulse 90  Temp(Src) 96.9 F (36.1 C) (Oral)  Resp 19  Ht 5' (1.524 m)  Wt 183 lb 8 oz (83.235 kg)  BMI 35.84 kg/m2  Weight is up 4 lbs. Awake, alert, ambulatory without assistance. Alopecia.  HEENT:PERRLA, sclera clear, anicteric and oropharynx clear, no lesions LymphaticsCervical, supraclavicular, and axillary nodes normal. Resp: clear to auscultation bilaterally and normal percussion bilaterally Cardio: regular rate and rhythm GI: soft, nontender, few bowel sounds, not distended, no appreciable HSM or mass, surgicl incision not remarkable Extremities: extremities normal, atraumatic, no cyanosis or edema Neuro:no sensory deficits noted Conversation fluent and appropriate Breast:normal without suspicious masses, skin or nipple changes or axillary nodes Portacath-without erythema or tenderness  Lab Results:  Results for orders placed in visit on 02/28/13  CBC WITH DIFFERENTIAL      Result Value Range   WBC 8.6  3.9 - 10.3 10e3/uL   NEUT# 6.0  1.5 - 6.5 10e3/uL   HGB 12.6  11.6 - 15.9 g/dL   HCT 16.1  09.6 - 04.5 %   Platelets 128 (*) 145 - 400 10e3/uL   MCV 93.0  79.5 -  101.0 fL   MCH 30.6  25.1 - 34.0 pg   MCHC 32.9  31.5 - 36.0 g/dL   RBC 4.09  8.11 - 9.14 10e6/uL   RDW 16.4 (*) 11.2 - 14.5 %   lymph# 1.9  0.9 - 3.3 10e3/uL   MONO# 0.6  0.1 - 0.9 10e3/uL   Eosinophils Absolute  0.1  0.0 - 0.5 10e3/uL   Basophils Absolute 0.0  0.0 - 0.1 10e3/uL   NEUT% 70.0  38.4 - 76.8 %   LYMPH% 21.5  14.0 - 49.7 %   MONO% 7.1  0.0 - 14.0 %   EOS% 0.9  0.0 - 7.0 %   BASO% 0.5  0.0 - 2.0 %   nRBC 0  0 - 0 %  COMPREHENSIVE METABOLIC PANEL (CC13)      Result Value Range   Sodium 143  136 - 145 mEq/L   Potassium 3.6  3.5 - 5.1 mEq/L   Chloride 103  98 - 109 mEq/L   CO2 29  22 - 29 mEq/L   Glucose 115  70 - 140 mg/dl   BUN 78.2  7.0 - 95.6 mg/dL   Creatinine 0.7  0.6 - 1.1 mg/dL   Total Bilirubin 2.13  0.20 - 1.20 mg/dL   Alkaline Phosphatase 135  40 - 150 U/L   AST 17  5 - 34 U/L   ALT 27  0 - 55 U/L   Total Protein 7.1  6.4 - 8.3 g/dL   Albumin 3.8  3.5 - 5.0 g/dL   Calcium 08.6  8.4 - 57.8 mg/dL    CA 469 available after visit 5.0; I do not have record of preop CA 125.  CMET available after visit entirely normal, including K 3.9, creat 0.7   Studies/Results:  No results found.  Medications: I have reviewed the patient's current medications. I have reviewed information on Nuvigil and discussed with pharmacist, no contraindication for use with this chemo (tho she will not take at least for ~ 3 days around each treatment).  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 Cycle 4 was given on 02-14-13 , with neulasta on 02-15-13. Cycle 5 to be given on 7-29 as long as ANC >=1.5 and plt >=100k and neulasta 03-08-13.Plan is 6 cycles (on 03/29/2013) then repeat scans prior to return visit to Dr Duard Brady (not yet scheduled).  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 lumbar disc disease symptomatic with LLE pain: continue to avoid activities that aggravate symptoms and use prn for pain. She is known to Dr Coletta Memos  6.elevated blood pressures better with addition of maxzide 25. Will follow at home and with visits here.  7.obesity,sleep apnea, narcolepsy- type disorder, fibromyalgia. Resume  Nuvigil as she discussed with Dr Vickey Huger.     Zachery Dakins, MD   02/28/2013, 11:26 AM

## 2013-03-01 ENCOUNTER — Other Ambulatory Visit: Payer: Medicare Other | Admitting: Lab

## 2013-03-03 ENCOUNTER — Telehealth: Payer: Self-pay | Admitting: Hematology and Oncology

## 2013-03-03 NOTE — Telephone Encounter (Signed)
Added f/u appt for 8/19. Other appts remain the same. lmonvm for pt re appts for 7/29, 7/30, 8/19, and 8/20. Pt to get new schedule when she comes in on 7/29.

## 2013-03-07 ENCOUNTER — Other Ambulatory Visit (HOSPITAL_BASED_OUTPATIENT_CLINIC_OR_DEPARTMENT_OTHER): Payer: Medicare Other | Admitting: Lab

## 2013-03-07 ENCOUNTER — Other Ambulatory Visit: Payer: Self-pay | Admitting: Hematology and Oncology

## 2013-03-07 ENCOUNTER — Ambulatory Visit (HOSPITAL_BASED_OUTPATIENT_CLINIC_OR_DEPARTMENT_OTHER): Payer: Medicare Other

## 2013-03-07 ENCOUNTER — Other Ambulatory Visit: Payer: Self-pay | Admitting: *Deleted

## 2013-03-07 VITALS — BP 139/87 | HR 94 | Temp 98.4°F

## 2013-03-07 DIAGNOSIS — C5700 Malignant neoplasm of unspecified fallopian tube: Secondary | ICD-10-CM

## 2013-03-07 DIAGNOSIS — M79609 Pain in unspecified limb: Secondary | ICD-10-CM

## 2013-03-07 DIAGNOSIS — Z5111 Encounter for antineoplastic chemotherapy: Secondary | ICD-10-CM

## 2013-03-07 DIAGNOSIS — C5701 Malignant neoplasm of right fallopian tube: Secondary | ICD-10-CM

## 2013-03-07 LAB — CBC WITH DIFFERENTIAL/PLATELET
BASO%: 0 % (ref 0.0–2.0)
Basophils Absolute: 0 10*3/uL (ref 0.0–0.1)
EOS%: 0 % (ref 0.0–7.0)
Eosinophils Absolute: 0 10*3/uL (ref 0.0–0.5)
HCT: 42.8 % (ref 34.8–46.6)
HGB: 14.2 g/dL (ref 11.6–15.9)
LYMPH%: 7.6 % — ABNORMAL LOW (ref 14.0–49.7)
MCH: 30.9 pg (ref 25.1–34.0)
MCHC: 33.2 g/dL (ref 31.5–36.0)
MCV: 93 fL (ref 79.5–101.0)
MONO#: 0 10*3/uL — ABNORMAL LOW (ref 0.1–0.9)
MONO%: 0.4 % (ref 0.0–14.0)
NEUT#: 7.1 10*3/uL — ABNORMAL HIGH (ref 1.5–6.5)
NEUT%: 92 % — ABNORMAL HIGH (ref 38.4–76.8)
Platelets: 140 10*3/uL — ABNORMAL LOW (ref 145–400)
RBC: 4.6 10*6/uL (ref 3.70–5.45)
RDW: 16 % — ABNORMAL HIGH (ref 11.2–14.5)
WBC: 7.7 10*3/uL (ref 3.9–10.3)
lymph#: 0.6 10*3/uL — ABNORMAL LOW (ref 0.9–3.3)
nRBC: 0 % (ref 0–0)

## 2013-03-07 MED ORDER — DIPHENHYDRAMINE HCL 50 MG/ML IJ SOLN
50.0000 mg | Freq: Once | INTRAMUSCULAR | Status: AC
  Administered 2013-03-07: 50 mg via INTRAVENOUS

## 2013-03-07 MED ORDER — PACLITAXEL CHEMO INJECTION 300 MG/50ML
135.0000 mg/m2 | Freq: Once | INTRAVENOUS | Status: AC
  Administered 2013-03-07: 246 mg via INTRAVENOUS
  Filled 2013-03-07: qty 41

## 2013-03-07 MED ORDER — HEPARIN SOD (PORK) LOCK FLUSH 100 UNIT/ML IV SOLN
500.0000 [IU] | Freq: Once | INTRAVENOUS | Status: AC | PRN
  Administered 2013-03-07: 500 [IU]
  Filled 2013-03-07: qty 5

## 2013-03-07 MED ORDER — FAMOTIDINE IN NACL 20-0.9 MG/50ML-% IV SOLN
20.0000 mg | Freq: Once | INTRAVENOUS | Status: AC
  Administered 2013-03-07: 20 mg via INTRAVENOUS

## 2013-03-07 MED ORDER — SODIUM CHLORIDE 0.9 % IV SOLN
Freq: Once | INTRAVENOUS | Status: AC
  Administered 2013-03-07: 09:00:00 via INTRAVENOUS

## 2013-03-07 MED ORDER — SODIUM CHLORIDE 0.9 % IJ SOLN
10.0000 mL | INTRAMUSCULAR | Status: DC | PRN
  Administered 2013-03-07: 10 mL
  Filled 2013-03-07: qty 10

## 2013-03-07 MED ORDER — DEXAMETHASONE SODIUM PHOSPHATE 20 MG/5ML IJ SOLN
20.0000 mg | Freq: Once | INTRAMUSCULAR | Status: AC
  Administered 2013-03-07: 20 mg via INTRAVENOUS

## 2013-03-07 MED ORDER — OXYCODONE-ACETAMINOPHEN 5-325 MG PO TABS
1.0000 | ORAL_TABLET | Freq: Once | ORAL | Status: AC
  Administered 2013-03-07: 1 via ORAL

## 2013-03-07 MED ORDER — ONDANSETRON 16 MG/50ML IVPB (CHCC)
16.0000 mg | Freq: Once | INTRAVENOUS | Status: AC
  Administered 2013-03-07: 16 mg via INTRAVENOUS

## 2013-03-07 MED ORDER — SODIUM CHLORIDE 0.9 % IV SOLN
622.5000 mg | Freq: Once | INTRAVENOUS | Status: AC
  Administered 2013-03-07: 620 mg via INTRAVENOUS
  Filled 2013-03-07: qty 62

## 2013-03-07 NOTE — Patient Instructions (Addendum)
[facility] Discharge Instructions for Patients Receiving Chemotherapy  Today you received the following chemotherapy agents:  Taxol & Carboplatin  To help prevent nausea and vomiting after your treatment, we encourage you to take your nausea medication    If you develop nausea and vomiting that is not controlled by your nausea medication, call the clinic.   BELOW ARE SYMPTOMS THAT SHOULD BE REPORTED IMMEDIATELY:  *FEVER GREATER THAN 100.5 F  *CHILLS WITH OR WITHOUT FEVER  NAUSEA AND VOMITING THAT IS NOT CONTROLLED WITH YOUR NAUSEA MEDICATION  *UNUSUAL SHORTNESS OF BREATH  *UNUSUAL BRUISING OR BLEEDING  TENDERNESS IN MOUTH AND THROAT WITH OR WITHOUT PRESENCE OF ULCERS  *URINARY PROBLEMS  *BOWEL PROBLEMS  UNUSUAL RASH Items with * indicate a potential emergency and should be followed up as soon as possible.  Feel free to call the clinic you have any questions or concerns. The clinic phone number is [phone].    

## 2013-03-07 NOTE — Progress Notes (Signed)
Pt reports pain from her back which she rates # 8 & usually doesn't take anything until #6 but was afraid to take this am due to chemo.  Will discuss with Dr Karel Jarvis.

## 2013-03-08 ENCOUNTER — Ambulatory Visit (HOSPITAL_BASED_OUTPATIENT_CLINIC_OR_DEPARTMENT_OTHER): Payer: Medicare Other

## 2013-03-08 VITALS — BP 135/75 | HR 94 | Temp 98.1°F

## 2013-03-08 DIAGNOSIS — Z5189 Encounter for other specified aftercare: Secondary | ICD-10-CM

## 2013-03-08 DIAGNOSIS — C5701 Malignant neoplasm of right fallopian tube: Secondary | ICD-10-CM

## 2013-03-08 DIAGNOSIS — C5702 Malignant neoplasm of left fallopian tube: Secondary | ICD-10-CM

## 2013-03-08 DIAGNOSIS — C5700 Malignant neoplasm of unspecified fallopian tube: Secondary | ICD-10-CM

## 2013-03-08 MED ORDER — PEGFILGRASTIM INJECTION 6 MG/0.6ML
6.0000 mg | Freq: Once | SUBCUTANEOUS | Status: AC
  Administered 2013-03-08: 6 mg via SUBCUTANEOUS
  Filled 2013-03-08: qty 0.6

## 2013-03-13 ENCOUNTER — Encounter: Payer: Self-pay | Admitting: Hematology and Oncology

## 2013-03-13 NOTE — Progress Notes (Signed)
They did cover dates of services 12/27/12 and 12/28/12.-- see prev notes

## 2013-03-13 NOTE — Progress Notes (Signed)
Per 1st step Neulasta, no more can be covered based on [employer] medicare.

## 2013-03-20 ENCOUNTER — Telehealth: Payer: Self-pay | Admitting: Gynecologic Oncology

## 2013-03-20 NOTE — Telephone Encounter (Signed)
Patient called this am with complaints of increased abdominal bloating and left leg pain.  Stating that she has been dealing with the leg pain for awhile, describing the pain as on the lateral aspect of the left leg, above and below the knee.  Stating that she had a scan that showed issues with her back as the cause for her nerve pain.  Also reporting "rapid weight gain" after her last chemo and abdominal bloating described as "a basketball."  Informed that Dr. Duard Brady would be notified and she would receive a return phone call with Dr. Denman George recommendations.  Pt in no acute distress at this time.  Verbalizing understanding.  Instructed to call for any questions or concerns.

## 2013-03-21 ENCOUNTER — Encounter: Payer: Self-pay | Admitting: Hematology and Oncology

## 2013-03-21 ENCOUNTER — Ambulatory Visit: Payer: Medicare Other

## 2013-03-21 ENCOUNTER — Other Ambulatory Visit: Payer: Medicare Other | Admitting: Lab

## 2013-03-21 ENCOUNTER — Telehealth: Payer: Self-pay | Admitting: Oncology

## 2013-03-21 NOTE — Telephone Encounter (Signed)
Faxed pt medical records to Dr. Uvaldo Rising

## 2013-03-21 NOTE — Progress Notes (Signed)
Per [facility]patient is not eligible for asst because she has [employer].  I will call and get card deactivated.

## 2013-03-23 ENCOUNTER — Ambulatory Visit: Payer: Medicare Other | Attending: Gynecologic Oncology | Admitting: Gynecologic Oncology

## 2013-03-23 ENCOUNTER — Encounter: Payer: Self-pay | Admitting: Gynecologic Oncology

## 2013-03-23 VITALS — BP 138/88 | HR 80 | Temp 97.6°F | Resp 20 | Ht 60.0 in | Wt 184.9 lb

## 2013-03-23 DIAGNOSIS — C569 Malignant neoplasm of unspecified ovary: Secondary | ICD-10-CM

## 2013-03-23 NOTE — Patient Instructions (Addendum)
Plan for your abdomen ultrasound tomorrow at [facility] at 11:00am.  Arrive at 10:45am to register at radiology and do not eat or drink six hours before.  Please call the office for any questions or concerns.  Follow up as scheduled.

## 2013-03-24 ENCOUNTER — Ambulatory Visit (HOSPITAL_COMMUNITY)
Admission: RE | Admit: 2013-03-24 | Discharge: 2013-03-24 | Disposition: A | Discharge status: Home / Self Care | Payer: Medicare Other | Source: Ambulatory Visit | Attending: Gynecologic Oncology | Admitting: Gynecologic Oncology

## 2013-03-24 ENCOUNTER — Encounter: Payer: Self-pay | Admitting: Gynecologic Oncology

## 2013-03-24 ENCOUNTER — Telehealth: Payer: Self-pay | Admitting: Gynecologic Oncology

## 2013-03-24 DIAGNOSIS — D3000 Benign neoplasm of unspecified kidney: Secondary | ICD-10-CM | POA: Insufficient documentation

## 2013-03-24 DIAGNOSIS — R142 Eructation: Secondary | ICD-10-CM | POA: Insufficient documentation

## 2013-03-24 DIAGNOSIS — Z9089 Acquired absence of other organs: Secondary | ICD-10-CM | POA: Insufficient documentation

## 2013-03-24 DIAGNOSIS — R143 Flatulence: Secondary | ICD-10-CM | POA: Insufficient documentation

## 2013-03-24 DIAGNOSIS — R141 Gas pain: Secondary | ICD-10-CM | POA: Insufficient documentation

## 2013-03-24 DIAGNOSIS — C569 Malignant neoplasm of unspecified ovary: Secondary | ICD-10-CM | POA: Insufficient documentation

## 2013-03-24 DIAGNOSIS — R1900 Intra-abdominal and pelvic swelling, mass and lump, unspecified site: Secondary | ICD-10-CM | POA: Insufficient documentation

## 2013-03-24 NOTE — Telephone Encounter (Signed)
Spoke with the patient's husband and asked that he have the patient call the office to discuss ultrasound results.

## 2013-03-24 NOTE — Telephone Encounter (Signed)
Patient informed of ultrasound results.  Instructed to call for any concerns.

## 2013-03-24 NOTE — Progress Notes (Signed)
Follow Up Note: Gyn-Onc  Kristin Webb 67 y.o. female  CC:  Chief Complaint  Patient presents with  . Ovarian Cancer    Follow up    HPI:  Kristin Webb is a 67 year old, gravida 2 para 2, who noticed increasing abdominal swelling around the third week of January.  Initially, the abdominal swelling was intermittent and would come and go.  She related this to recent initiation of a calcium supplement.  The abdominal swelling became more prevalent starting in February with abdominal pain reported also.  In addition, she started having increasing constipation and formed stools, which was a change in her routine of 5-6 loose bowel movements a day chronically after her cholecystectomy.  On November 01, 2012, she had a CT scan of the abdomen and pelvis which resulted no significant biliary dilation, no suspicious liver lesions, and the spleen, adrenal glands and pancreas appeared normal. 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 She has a large midabdominal mass measuring 17 x 22.7 cm x 20 cm cephalad, which was well circumscribed without calcifications. There is irregular thickened septations and areas of solid nodularity consistent with malignancy. There is no bowel obstruction and the mass appeared to be separate from the appendix.  There was probable normal left ovarian tissue seen. The uterus is surgically absent. There is some soft tissue stranding at the base of the mesentery superior to the mass and some omental nodularity.  She was then referred to Gynecologic Oncology.    On November 08, 2012, she underwent an exploratory laparotomy, BSO, appendectomy, infracolic omentectomy, and optimal debulking by Dr. Cleda Mccreedy.  Operative findings included:  25 cm right adnexal mass with smooth surface. Surgically absent uterus. Atrophic-appearing left ovary. Normal appearing appendix. Within the omentum there were centimeter nodules  scattered throughout the omentum. The remainder of the surfaces were benign.  Final pathology revealed: 1. 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 - BENIGN FALLOPIAN TUBE WITH NONSPECIFIC CHRONIC INFLAMMATION. 2. Ovary and fallopian tube, left - BENIGN OVARY; NEGATIVE FOR ATYPIA OR MALIGNANCY. - BENIGN FALLOPIAN TUBE; NEGATIVE FOR ATYPIA OR MALIGNANCY.  3. Omentum, resection for tumor - HIGH GRADE CARCINOMA, SEE COMMENT.  4. Appendix, Other than Incidental - FIBROUS OBLITERATION OF APPENDICEAL TIP. - NEGATIVE FOR MALIGNANCY.  Her last CA 125 on 02/07/13 was 5.0.  She recently completed day 1, cycle 5 of 6 planned treatments of carboplatin and taxol on 03/07/13.      Recent HPI:  She presents today for continued follow up.  Adequate PO intake reported, stating that her appetite is 150%.  Bowels and bladder functioning without difficulty with intermittent constipation.  She takes Senna-S on a daily basis to prevent constipation.  She reports increased abdominal swelling intermittently.  She states that the swelling is not related to her dietary choices.  Lower abdominal pressure reported when moderate swelling present.  No dyspnea reported.  Lower back and left leg discomfort reported.  She states that she sees a neurosurgeon for this and he recommended participating in water aerobics three times a week but she cannot since it is a public pool and she is currently receiving chemotherapy.  She denies fever, chills, weakness, fatigue, nausea, vomiting, bright red vaginal bleeding/discharge, or rectal bleeding.  No other concerns voiced.  Review of Systems: Constitutional: Feels well.  No fever, chills, weakness, fatigue, early satiety.  Increase in weight since last visit but reporting appetite at 150%.  Cardiovascular: No chest pain, shortness of  breath, or edema.  Pulmonary: No cough or wheeze.  Gastrointestinal: No nausea, vomiting, or diarrhea. Intermittent mild constipation.  No bright red blood per rectum or change in bowel movement.  Genitourinary: No frequency, urgency, or dysuria. No bright red vaginal bleeding or discharge.  Musculoskeletal:  Intermittent left lower extremity ache and burning sensation. Neurologic: No weakness or numbness.  Begins to walk with a limp in the left lower extremity at the end of the day.  Psychology: No depression, anxiety, or insomnia.  Current Meds:  Outpatient Encounter Prescriptions as of 03/23/2013  Medication Sig Dispense Refill  . Calcium Citrate-Vitamin D (CALCIUM CITRATE + D PO) Take 1 capsule by mouth daily.       . Cholecalciferol (VITAMIN D3) 5000 UNITS CAPS Take 5,000 mg by mouth daily.       . CRESTOR 10 MG tablet       . dexamethasone (DECADRON) 4 MG tablet Take 5 tablets (20mg ) 12 hours and 6 hours prior to taxol. Take with food  30 tablet  0  . diphenhydrAMINE (BENADRYL) 25 mg capsule Take 50-75 mg by mouth at bedtime as needed for sleep.       . Eszopiclone (ESZOPICLONE) 3 MG TABS Take 3 mg by mouth at bedtime. Take immediately before bedtime      . fish oil-omega-3 fatty acids 1000 MG capsule Take 1 g by mouth 2 (two) times daily.      Marland Kitchen FLUoxetine (PROZAC) 10 MG capsule Take 30 mg by mouth every evening.      Marland Kitchen glucosamine-chondroitin 500-400 MG tablet Take 1 tablet by mouth 2 (two) times daily.      Marland Kitchen HYDROcodone-acetaminophen (NORCO/VICODIN) 5-325 MG per tablet Take 1 tab every 4-6 hours as needed for pain  20 tablet  0  . ibuprofen (ADVIL,MOTRIN) 200 MG tablet Take 200 mg by mouth every 6 (six) hours as needed for pain.      Marland Kitchen lidocaine-prilocaine (EMLA) cream Apply topically as needed. Apply to port 1 hour before access.  30 g  2  . Melatonin 10 MG TABS Take 10 mg by mouth at bedtime.      . Multiple Vitamin (MULTIVITAMIN WITH MINERALS) TABS Take 1 tablet by mouth daily.       Marland Kitchen NUVIGIL 250 MG tablet Take by mouth daily.       . ondansetron (ZOFRAN) 8 MG tablet Take 1 tablet (8 mg total) by mouth every 12 (twelve) hours as needed. 1-2 tablets every 12 hours as needed for nausea after chemo  20 tablet  0  . OVER THE COUNTER MEDICATION Nystatin topical powder  100,000 units/ GM  Patient uses as needed      . prochlorperazine (COMPAZINE) 10 MG tablet 1 tablet every 6 hours as needed for nausea  20 tablet  0  . triamterene-hydrochlorothiazide (MAXZIDE-25) 37.5-25 MG per tablet Take 1 tablet by mouth daily.  30 tablet  1  . EPINEPHrine (EPIPEN 2-PAK) 0.3 mg/0.3 mL DEVI Inject 0.3 mg into the muscle once. For bee stings       No facility-administered encounter medications on file as of 03/23/2013.    Allergy:  Allergies  Allergen Reactions  . Bee Venom Hives    Difficulty breathing, carries an EPI-pen  . Cortisone     Injected cortisone, "  sick to my stomach, throwing up, hand swelling, and pain."    Social Hx:   History   Social History  . Marital Status: Married    Spouse Name: N/A    Number of Children: N/A  . Years of Education: N/A   Occupational History  . Not on file.   Social History Main Topics  . Smoking status: Never Smoker   . Smokeless tobacco: Never Used  . Alcohol Use: No  . Drug Use: No  . Sexual Activity: Not on file   Other Topics Concern  . Not on file   Social History Narrative  . No narrative on file    Past Surgical Hx:  Past Surgical History  Procedure Laterality Date  . Cholecystectomy      early 13s  . Abdominal hysterectomy      early 1990s  . Trigger finger release    . Laparotomy Bilateral 11/08/2012    Procedure: EXPLORATORY LAPAROTOMY TOTAL ABDOMINAL HYSTERECTOMY BILATERAL SALPINGO OOPHORECTOMY TUMOR DEBULKING ;  Surgeon: Rejeana Brock A. Duard Brady, MD;  Location: [facility];  Service: Gynecology;  Laterality: Bilateral;  APPENDECTOMY / OMENTECTOMY  . Appendectomy    . Exploratory laparotomy  11/08/12    BSO,  appendectomy, omentectomy    Past Medical Hx:  Past Medical History  Diagnosis Date  . Pelvic mass in female   . Constipation   . Diarrhea     in the past after gallbladder removal  . Hyperlipidemia   . Burning mouth syndrome   . Chronic fatigue   . Fibromyalgia   . Lumbar disc disease   . PONV (postoperative nausea and vomiting)   . Hypertension     borderline not on meds   . Shortness of breath     with exertion   . Sleep apnea     CPAP settings at 12   . Pneumonia     hx of pneumonia   . GERD (gastroesophageal reflux disease)   . Yeast infection     Family Hx:  Family History  Problem Relation Age of Onset  . Lung cancer Mother   . Lung cancer Father     Vitals:  Blood pressure 138/88, pulse 80, temperature 97.6 F (36.4 C), temperature source Oral, resp. rate 20, height 5' (1.524 m), weight 184 lb 14.4 oz (83.87 kg).  Physical Exam: General: Well developed, well nourished female in no acute distress. Alert and oriented x 3.    Head/Neck: Sclerae anicteric with no excessive lacrimation.  Oropharynx clear.  Supple without any enlargements.  Lymph node survey: No cervical, supraclavicular, or inguinal adenopathy.  Cardiovascular: Regular rate and rhythm. S1 and S2 normal.  Lungs: Clear to auscultation bilaterally. No wheezes/crackles/rhonchi noted.  Skin: No rashes or lesions present. Back: No CVA tenderness.  Abdomen: Abdomen soft, non-tender and obese. Active bowel sounds in all quadrants. No evidence of a fluid wave.  Midline abdominal incision well healed.  When patient sat up from a supine position, mild bulge noted in the upper abdomen appearing as an abdominal hernia.  Dr. Duard Brady examined the abdomen also.      Genitourinary:    Vulva/vagina: Normal external female genitalia. No lesions.    Urethra: No lesions or masses.    Vagina: Atrophic without any lesions. No palpable masses. No vaginal bleeding or drainage noted.  Vaginal cuff intact.   Rectal: Good  tone, no palpable masses or nodularity. Extremities: No bilateral cyanosis, edema, or clubbing.   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 She was completely resected to an R0.  We will obtain an abdomen ultrasound to evaluate complaints of increased abdominal swelling with the most likely cause related to weight gain and a mild hernia.  She is advised to increase her physical activity as tolerated along with monitoring portion sizes.  She is to continue with her current chemotherapy regimen and schedule with recommendations for 6 cycles of taxol and carboplatin per Dr. Duard Brady.  She is advised to call for any questions or concerns.  Reportable signs and symptoms reviewed.  We will contact her with the results of her ultrasound.  The patient was reviewed and examined with Dr. Duard Brady.     CROSS, MELISSA DEAL, NP 03/24/2013, 11:10 AM

## 2013-03-27 ENCOUNTER — Other Ambulatory Visit: Payer: Self-pay | Admitting: Oncology

## 2013-03-27 ENCOUNTER — Encounter: Payer: Self-pay | Admitting: Oncology

## 2013-03-27 ENCOUNTER — Telehealth: Payer: Self-pay | Admitting: Oncology

## 2013-03-27 ENCOUNTER — Ambulatory Visit: Payer: Medicare Other | Attending: Oncology | Admitting: Oncology

## 2013-03-27 ENCOUNTER — Ambulatory Visit (HOSPITAL_BASED_OUTPATIENT_CLINIC_OR_DEPARTMENT_OTHER): Payer: Medicare Other | Admitting: Lab

## 2013-03-27 VITALS — BP 152/100 | HR 103 | Temp 98.0°F | Resp 18 | Ht 60.0 in | Wt 187.3 lb

## 2013-03-27 DIAGNOSIS — C5701 Malignant neoplasm of right fallopian tube: Secondary | ICD-10-CM

## 2013-03-27 DIAGNOSIS — C5700 Malignant neoplasm of unspecified fallopian tube: Secondary | ICD-10-CM

## 2013-03-27 DIAGNOSIS — M5116 Intervertebral disc disorders with radiculopathy, lumbar region: Secondary | ICD-10-CM

## 2013-03-27 LAB — CBC WITH DIFFERENTIAL/PLATELET
BASO%: 0.3 % (ref 0.0–2.0)
Basophils Absolute: 0 10*3/uL (ref 0.0–0.1)
EOS%: 0.9 % (ref 0.0–7.0)
Eosinophils Absolute: 0.1 10*3/uL (ref 0.0–0.5)
HCT: 40.4 % (ref 34.8–46.6)
HGB: 13.4 g/dL (ref 11.6–15.9)
LYMPH%: 28.3 % (ref 14.0–49.7)
MCH: 31.8 pg (ref 25.1–34.0)
MCHC: 33.2 g/dL (ref 31.5–36.0)
MCV: 95.7 fL (ref 79.5–101.0)
MONO#: 0.6 10*3/uL (ref 0.1–0.9)
MONO%: 8.1 % (ref 0.0–14.0)
NEUT#: 4.8 10*3/uL (ref 1.5–6.5)
NEUT%: 62.4 % (ref 38.4–76.8)
Platelets: 201 10*3/uL (ref 145–400)
RBC: 4.22 10*6/uL (ref 3.70–5.45)
RDW: 15.5 % — ABNORMAL HIGH (ref 11.2–14.5)
WBC: 7.8 10*3/uL (ref 3.9–10.3)
lymph#: 2.2 10*3/uL (ref 0.9–3.3)
nRBC: 0 % (ref 0–0)

## 2013-03-27 NOTE — Progress Notes (Signed)
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 She has needed neulasta, which will be given also with this last treatment. She has tolerated chemotherapy well overall, with fatigue better since she has been back on Nuvigil for narcolepsy-type problem, constipation controlled with senokot S around each treatment, and no worsening of preexisting peripheral neuropathy symptoms. She clinically has a new ventral abdominal hernia, with no other findings of concern by gyn onc exam 03-23-13 or on abdominal US done 03-24-13.  Radicular symptoms involving RLE are persistent but tolerable for her to complete chemotherapy prior to referral back to Dr. Franky Macho for known lumbar disc disease. She has PAC in, which has been functioning well. She understands that this will need flush every 6-8 weeks when not otherwise used.   Review of Systems as above, also:  Scalp irritation much better using conditioner only when she showers. No nausea. Bowels moving daily. Uses ibuprofen ~ once daily for back symptoms, when the continuous discomfort is more bothersome late in day. Pain is lateral left leg above and below knee, not involving joint. No swelling LE. No increase in neuropathy in feet which preceded chemo, and only minimal intermittent symptoms in fingers. No fever or symptoms of infection. No bleeding. Remainder of 10 point Review of Systems negative.  Objective:  Vital signs in last 24 hours:  BP 152/100  Pulse 103  Temp(Src) 98 F (36.7 C) (Oral)  Resp 18  Ht 5' (1.524 m)  Wt 187 lb 5 oz (84.964 kg)  BMI 36.58 kg/m2  Akert, NAD, ambulatory without assistance, chewing gum due to the  chronic dental problem.   HEENT: not quite total alopecia. PERRL, not icteric. Oral mucosa a little dry without lesions, post pharynx clear. Neck supple Lymphatics no cervical, supraclavicular or inguinal adenopathy Resp: lungs clear to A and P Cardio: RRR no gallop, clear heart sounds GI: upper ventral hernia obvious with changes in position, not tender. Abdomen otherwise not obviously distended. Bowel sounds somewhat diminished but present. Soft, nontender thruout, no appreciable HSM Extremities: no edema, cords, tenderness Neuro:as above Skin without rash or ecchymosis Portacath-without erythema or tenderness  Lab Results:  Results for orders placed in visit on 03/27/13  CBC WITH DIFFERENTIAL      Result Value Range   WBC 7.8  3.9 - 10.3 10e3/uL   NEUT# 4.8  1.5 - 6.5 10e3/uL   HGB 13.4  11.6 - 15.9 g/dL   HCT 91.4  78.2 - 95.6 %   Platelets 201  145 - 400 10e3/uL   MCV 95.7  79.5 - 101.0 fL   MCH 31.8  25.1 - 34.0 pg   MCHC 33.2  31.5 - 36.0 g/dL   RBC 2.13  0.86 - 5.78 10e6/uL   RDW 15.5 (*) 11.2 - 14.5 %   lymph# 2.2  0.9 - 3.3 10e3/uL   MONO# 0.6  0.1 - 0.9 10e3/uL   Eosinophils Absolute 0.1  0.0 - 0.5 10e3/uL   Basophils Absolute 0.0  0.0 - 0.1 10e3/uL   NEUT% 62.4  38.4 - 76.8 %   LYMPH% 28.3  14.0 - 49.7 %   MONO% 8.1  0.0 - 14.0 %   EOS% 0.9  0.0 - 7.0 %  BASO% 0.3  0.0 - 2.0 %   nRBC 0  0 - 0 %    CMET ordered today but not drawn, so will need to be drawn stat from [facility] prior to carboplatin on 03-28-13, with pharmacy to confirm dose based on that lab.  Studies/Results: ABDOMEN ULTRASOUND 03-24-13 COMPARISON: Abdominal pelvic CT 11/01/2012.  FINDINGS:  Gallbladder: Surgically absent.  Common bile duct: Normal in caliber without filling defects.  Liver: The hepatic echogenicity is increased and slightly  heterogeneous. No focal lesions are identified.  IVC: Appears normal.  Pancreas: The visualized portions appear unremarkable.  Spleen: Normal in appearance,  measuring 7.2 cm.  Right Kidney: The renal cortical thickness and echogenicity are  normal. There is no hydronephrosis. There is a 1.2 cm echogenic  lesion in the interpolar region, corresponding with a small  angiomyolipoma on prior CT. There are no suspicious cortical  lesions. Renal length is 11.5 cm.  Left Kidney: The renal cortical thickness and echogenicity are  normal. There is no hydronephrosis. There are no suspicious cortical  lesions. Renal length is 11.5 cm.  Abdominal aorta: No aneurysm identified. No ascites is demonstrated.  IMPRESSION:  No acute abdominal findings or ascites status post cholecystectomy.  Increased hepatic echogenicity may represent steatosis. Stable small  right renal angiomyolipoma.   Medications: I have reviewed the patient's current medications.  We have discussed follow up after chemo completes, including return visit with lab to medical oncology ~ 3 weeks after chemo, then CT AP ~ 04-24-13 prior to visit back to Dr Duard Brady on 04-26-13. As long as she is doing well, she may be appropriate to alternate visits with gyn oncology and medical oncology. Note PAC will need to be kept flushed as above. The CT should image the ventral abdominal hernia better than [place], but she is reassured that the [place] otherwise had nothing of concern from standpoint of the cancer. She will discuss surgical options with Dr Duard Brady and can try an abdominal binder in interim. I will ask Dr Franky Macho to see her again due to LE radicular nerve pain; she does hope to return to water exercises that she had done for this for ~ a year prior to starting chemotherapy.   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 I will see her with labs in ~ 3 weeks, then CT AP shortly before visit back with Dr Duard Brady on 04-26-13.  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 lumbar  disc disease symptomatic with LLE pain: continue to avoid activities that aggravate symptoms and use prn for pain. Dr Coletta Memos has seen her previously and we will ask him to reevaluate. 6.elevated blood pressures better with addition of maxzide by recent EMR information, tho BP higher today as she rushed to this appointment. Will follow.  7.obesity,sleep apnea, narcolepsy- type disorder, fibromyalgia. Back on Nuvigil per Dr Vickey Huger.    Patient has had questions answered to her satisfaction      Reece Packer, MD   03/27/2013, 4:07 PM

## 2013-03-28 ENCOUNTER — Ambulatory Visit: Payer: Medicare Other

## 2013-03-28 ENCOUNTER — Other Ambulatory Visit (HOSPITAL_BASED_OUTPATIENT_CLINIC_OR_DEPARTMENT_OTHER): Payer: Medicare Other | Admitting: Lab

## 2013-03-28 ENCOUNTER — Ambulatory Visit (HOSPITAL_BASED_OUTPATIENT_CLINIC_OR_DEPARTMENT_OTHER): Payer: Medicare Other

## 2013-03-28 ENCOUNTER — Other Ambulatory Visit: Payer: Medicare Other | Admitting: Lab

## 2013-03-28 VITALS — BP 159/94 | HR 99 | Temp 98.4°F | Resp 20

## 2013-03-28 DIAGNOSIS — C5701 Malignant neoplasm of right fallopian tube: Secondary | ICD-10-CM

## 2013-03-28 DIAGNOSIS — C5700 Malignant neoplasm of unspecified fallopian tube: Secondary | ICD-10-CM

## 2013-03-28 DIAGNOSIS — Z5111 Encounter for antineoplastic chemotherapy: Secondary | ICD-10-CM

## 2013-03-28 LAB — COMPREHENSIVE METABOLIC PANEL (CC13)
ALT: 42 U/L (ref 0–55)
AST: 20 U/L (ref 5–34)
Albumin: 4 g/dL (ref 3.5–5.0)
Alkaline Phosphatase: 121 U/L (ref 40–150)
BUN: 14.7 mg/dL (ref 7.0–26.0)
CO2: 21 mEq/L — ABNORMAL LOW (ref 22–29)
Calcium: 9.9 mg/dL (ref 8.4–10.4)
Chloride: 102 mEq/L (ref 98–109)
Creatinine: 0.8 mg/dL (ref 0.6–1.1)
Glucose: 282 mg/dl — ABNORMAL HIGH (ref 70–140)
Potassium: 4 mEq/L (ref 3.5–5.1)
Sodium: 138 mEq/L (ref 136–145)
Total Bilirubin: 0.27 mg/dL (ref 0.20–1.20)
Total Protein: 7.8 g/dL (ref 6.4–8.3)

## 2013-03-28 MED ORDER — SODIUM CHLORIDE 0.9 % IV SOLN
Freq: Once | INTRAVENOUS | Status: AC
  Administered 2013-03-28: 10:00:00 via INTRAVENOUS

## 2013-03-28 MED ORDER — SODIUM CHLORIDE 0.9 % IJ SOLN
10.0000 mL | INTRAMUSCULAR | Status: DC | PRN
  Administered 2013-03-28: 10 mL
  Filled 2013-03-28: qty 10

## 2013-03-28 MED ORDER — SODIUM CHLORIDE 0.9 % IV SOLN
620.0000 mg | Freq: Once | INTRAVENOUS | Status: AC
  Administered 2013-03-28: 620 mg via INTRAVENOUS
  Filled 2013-03-28: qty 62

## 2013-03-28 MED ORDER — PACLITAXEL CHEMO INJECTION 300 MG/50ML
135.0000 mg/m2 | Freq: Once | INTRAVENOUS | Status: AC
  Administered 2013-03-28: 246 mg via INTRAVENOUS
  Filled 2013-03-28: qty 41

## 2013-03-28 MED ORDER — DEXAMETHASONE SODIUM PHOSPHATE 20 MG/5ML IJ SOLN
20.0000 mg | Freq: Once | INTRAMUSCULAR | Status: AC
  Administered 2013-03-28: 20 mg via INTRAVENOUS

## 2013-03-28 MED ORDER — FAMOTIDINE IN NACL 20-0.9 MG/50ML-% IV SOLN
20.0000 mg | Freq: Once | INTRAVENOUS | Status: AC
  Administered 2013-03-28: 20 mg via INTRAVENOUS

## 2013-03-28 MED ORDER — DIPHENHYDRAMINE HCL 50 MG/ML IJ SOLN
50.0000 mg | Freq: Once | INTRAMUSCULAR | Status: AC
  Administered 2013-03-28: 50 mg via INTRAVENOUS

## 2013-03-28 MED ORDER — HEPARIN SOD (PORK) LOCK FLUSH 100 UNIT/ML IV SOLN
500.0000 [IU] | Freq: Once | INTRAVENOUS | Status: AC | PRN
  Administered 2013-03-28: 500 [IU]
  Filled 2013-03-28: qty 5

## 2013-03-28 MED ORDER — ONDANSETRON 16 MG/50ML IVPB (CHCC)
16.0000 mg | Freq: Once | INTRAVENOUS | Status: AC
  Administered 2013-03-28: 16 mg via INTRAVENOUS

## 2013-03-29 ENCOUNTER — Ambulatory Visit (HOSPITAL_BASED_OUTPATIENT_CLINIC_OR_DEPARTMENT_OTHER): Payer: Medicare Other

## 2013-03-29 VITALS — BP 153/79 | HR 81 | Temp 98.0°F

## 2013-03-29 DIAGNOSIS — C5702 Malignant neoplasm of left fallopian tube: Secondary | ICD-10-CM

## 2013-03-29 DIAGNOSIS — C5701 Malignant neoplasm of right fallopian tube: Secondary | ICD-10-CM

## 2013-03-29 DIAGNOSIS — Z5189 Encounter for other specified aftercare: Secondary | ICD-10-CM

## 2013-03-29 DIAGNOSIS — C5700 Malignant neoplasm of unspecified fallopian tube: Secondary | ICD-10-CM

## 2013-03-29 MED ORDER — PEGFILGRASTIM INJECTION 6 MG/0.6ML
6.0000 mg | Freq: Once | SUBCUTANEOUS | Status: AC
  Administered 2013-03-29: 6 mg via SUBCUTANEOUS
  Filled 2013-03-29: qty 0.6

## 2013-04-03 ENCOUNTER — Telehealth: Payer: Self-pay | Admitting: Oncology

## 2013-04-13 ENCOUNTER — Ambulatory Visit: Payer: Medicare Other | Admitting: Gynecologic Oncology

## 2013-04-16 ENCOUNTER — Other Ambulatory Visit: Payer: Self-pay | Admitting: Oncology

## 2013-04-16 DIAGNOSIS — C5701 Malignant neoplasm of right fallopian tube: Secondary | ICD-10-CM

## 2013-04-17 ENCOUNTER — Ambulatory Visit (HOSPITAL_BASED_OUTPATIENT_CLINIC_OR_DEPARTMENT_OTHER): Payer: Medicare Other | Admitting: Oncology

## 2013-04-17 ENCOUNTER — Other Ambulatory Visit (HOSPITAL_BASED_OUTPATIENT_CLINIC_OR_DEPARTMENT_OTHER): Payer: Medicare Other | Admitting: Lab

## 2013-04-17 ENCOUNTER — Telehealth: Payer: Self-pay | Admitting: *Deleted

## 2013-04-17 ENCOUNTER — Ambulatory Visit: Payer: Medicare Other | Admitting: Oncology

## 2013-04-17 ENCOUNTER — Other Ambulatory Visit: Payer: Self-pay

## 2013-04-17 VITALS — BP 156/97 | HR 96 | Temp 98.1°F | Resp 20 | Ht 60.0 in | Wt 188.6 lb

## 2013-04-17 DIAGNOSIS — C5700 Malignant neoplasm of unspecified fallopian tube: Secondary | ICD-10-CM

## 2013-04-17 DIAGNOSIS — I10 Essential (primary) hypertension: Secondary | ICD-10-CM

## 2013-04-17 DIAGNOSIS — C569 Malignant neoplasm of unspecified ovary: Secondary | ICD-10-CM

## 2013-04-17 DIAGNOSIS — C5701 Malignant neoplasm of right fallopian tube: Secondary | ICD-10-CM

## 2013-04-17 DIAGNOSIS — E669 Obesity, unspecified: Secondary | ICD-10-CM

## 2013-04-17 DIAGNOSIS — K439 Ventral hernia without obstruction or gangrene: Secondary | ICD-10-CM

## 2013-04-17 LAB — CA 125: CA 125: 4.7 U/mL (ref 0.0–30.2)

## 2013-04-17 LAB — CBC WITH DIFFERENTIAL/PLATELET
BASO%: 0.7 % (ref 0.0–2.0)
Basophils Absolute: 0 10*3/uL (ref 0.0–0.1)
EOS%: 1.3 % (ref 0.0–7.0)
Eosinophils Absolute: 0.1 10*3/uL (ref 0.0–0.5)
HCT: 36.6 % (ref 34.8–46.6)
HGB: 12.3 g/dL (ref 11.6–15.9)
LYMPH%: 22 % (ref 14.0–49.7)
MCH: 32.4 pg (ref 25.1–34.0)
MCHC: 33.6 g/dL (ref 31.5–36.0)
MCV: 96.3 fL (ref 79.5–101.0)
MONO#: 0.6 10*3/uL (ref 0.1–0.9)
MONO%: 10.2 % (ref 0.0–14.0)
NEUT#: 4.2 10*3/uL (ref 1.5–6.5)
NEUT%: 65.8 % (ref 38.4–76.8)
Platelets: 124 10*3/uL — ABNORMAL LOW (ref 145–400)
RBC: 3.8 10*6/uL (ref 3.70–5.45)
RDW: 16.1 % — ABNORMAL HIGH (ref 11.2–14.5)
WBC: 6.3 10*3/uL (ref 3.9–10.3)
lymph#: 1.4 10*3/uL (ref 0.9–3.3)

## 2013-04-17 LAB — COMPREHENSIVE METABOLIC PANEL (CC13)
ALT: 38 U/L (ref 0–55)
AST: 21 U/L (ref 5–34)
Albumin: 3.8 g/dL (ref 3.5–5.0)
Alkaline Phosphatase: 95 U/L (ref 40–150)
BUN: 12.1 mg/dL (ref 7.0–26.0)
CO2: 25 mEq/L (ref 22–29)
Calcium: 9.7 mg/dL (ref 8.4–10.4)
Chloride: 105 mEq/L (ref 98–109)
Creatinine: 0.6 mg/dL (ref 0.6–1.1)
Glucose: 101 mg/dl (ref 70–140)
Potassium: 3.5 mEq/L (ref 3.5–5.1)
Sodium: 142 mEq/L (ref 136–145)
Total Bilirubin: 0.31 mg/dL (ref 0.20–1.20)
Total Protein: 7 g/dL (ref 6.4–8.3)

## 2013-04-17 MED ORDER — TRIAMTERENE-HCTZ 37.5-25 MG PO TABS: 1.0000 | ORAL_TABLET | Freq: Every day | ORAL | Status: DC

## 2013-04-17 NOTE — Progress Notes (Signed)
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 She tolerated chemotherapy fairly well, did require neulasta and has PAC in. She has developed a large ventral hernia, which seems to be aggravating previous back and LE problems; she is to see Dr Franky Macho also today. She is scheduled for CT AP on 04-24-13 and to see Dr Duard Brady on 04-26-13. We have discussed keeping PAC flushed every 6-8 weeks when not otherwise used. Her peripheral IV access is quite difficult.    ONCOLOGIC HISTORY Patient presented in ~ Jan 2014 with complaints of abdominal distension and change in bowels. She had  unremarkable colonoscopy July 2013 (not in this EMR, and patient does not recall which MD). She is post hysterectomy in 1990s. Abdominal symptoms did not improve with interventions for constipation, and CT AP was done in [place] system 11-01-12 with finding of large mid abdominal mass. She was referred to Dr Duard Brady. Preop CA 125 is not available in this EMR; preop CXR had some linear scarring in lingula without acute findings. She went to exploratory laparotomy, bilateral salpingo-oophorectomy, appendectomy, and infacolic omentectomy, which was optimal debulking, by Dr Duard Brady on 11-08-2012. Operative findings: 25 cm right adnexal mass with smooth surface. Surgically absent uterus. Atrophic-appearing left ovary. Within the omentum there were centimeter nodules scattered throughout. The remainder of the surfaces were benign. 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 Her post operative course was unremarkable. She  saw Dr Duard Brady in follow up on 11-17-12, with recommendation for 6 cycles of taxol/ carboplatin. She had PAC placed by IR on 12-08-12 and cycle 1 taxol carbo 12-13-12; she was neutropenic by day 15 cycle 1 and has had neulasta day 2 with subsequent cycles. Cycle 6 was given 03-28-2013.    Review of systems as above, also: Not using nausea meds or pain meds now. No increase in peripheral neuropathy. BPs at home 130-145 / 80-90. Bowels moving, no bladder symptoms. No bleeding. No swelling LE. No respiratory symptoms. She has tried abdominal binder. Remainder of 10 point Review of Systems negative.  Objective:  Vital signs in last 24 hours:  BP 156/97  Pulse 96  Temp(Src) 98.1 F (36.7 C) (Oral)  Resp 20  Ht 5' (1.524 m)  Wt 188 lb 9.6 oz (85.548 kg)  BMI 36.83 kg/m2 Weight is up 1 lb from last, and up 13 lbs from my consultation visit in May 2014. Alert, oriented and appropriate. Ambulatory without assistance.   HEENT:PERRL, sclerae not icteric. Oral mucosa moist without lesions, posterior pharynx clear. Alopecia. Neck supple. No JVD. Marland Kitchen Lymphatics no cervical or suraclavicular adenopathy Resp: clear to auscultation bilaterally and normal percussion bilaterally Cardio: regular rate and rhythm. No gallop. GI: obese, soft, nontender, large ventral hernia, cannot appreciate mass or organomegaly. Extremities: without pitting edema, cords Neuro: no change peripheral neuropathy.  Skin without rash, ecchymosis, petechiae Portacath-without erythema or tenderness  Lab Results:  Results for orders placed in visit on 04/17/13  CBC WITH DIFFERENTIAL      Result Value Range   WBC 6.3  3.9 - 10.3 10e3/uL   NEUT# 4.2  1.5 - 6.5 10e3/uL   HGB 12.3  11.6 -  15.9 g/dL   HCT 16.1  09.6 - 04.5 %   Platelets 124 (*) 145 - 400 10e3/uL   MCV 96.3  79.5 - 101.0 fL   MCH 32.4  25.1 - 34.0 pg   MCHC 33.6  31.5 - 36.0 g/dL   RBC 4.09  8.11 - 9.14 10e6/uL   RDW 16.1 (*) 11.2 - 14.5 %   lymph# 1.4  0.9 - 3.3  10e3/uL   MONO# 0.6  0.1 - 0.9 10e3/uL   Eosinophils Absolute 0.1  0.0 - 0.5 10e3/uL   Basophils Absolute 0.0  0.0 - 0.1 10e3/uL   NEUT% 65.8  38.4 - 76.8 %   LYMPH% 22.0  14.0 - 49.7 %   MONO% 10.2  0.0 - 14.0 %   EOS% 1.3  0.0 - 7.0 %   BASO% 0.7  0.0 - 2.0 %  COMPREHENSIVE METABOLIC PANEL (CC13)      Result Value Range   Sodium 142  136 - 145 mEq/L   Potassium 3.5  3.5 - 5.1 mEq/L   Chloride 105  98 - 109 mEq/L   CO2 25  22 - 29 mEq/L   Glucose 101  70 - 140 mg/dl   BUN 78.2  7.0 - 95.6 mg/dL   Creatinine 0.6  0.6 - 1.1 mg/dL   Total Bilirubin 2.13  0.20 - 1.20 mg/dL   Alkaline Phosphatase 95  40 - 150 U/L   AST 21  5 - 34 U/L   ALT 38  0 - 55 U/L   Total Protein 7.0  6.4 - 8.3 g/dL   Albumin 3.8  3.5 - 5.0 g/dL   Calcium 9.7  8.4 - 08.6 mg/dL  CA 578 available after visit 4.7   Studies/Results:  No results found. CT AP pending 04-24-13  Medications: I have reviewed the patient's current medications. This office will refill maxzide-25 until she sees Dr Gweneth Dimitri next, which should be for annual PE (that PE due around time of this cancer diagnosis and to be rescheduled now).  I have talked with her about diet and exercise options with goal of ideal weight, including GOG study, [facility] dietician and the water exercise classes.I have asked [facility] to see if she is eligible/ interested in GOG 0225 diet and exercise for ideal weight (CT upcoming).   Assessment/Plan: 1.IIIB serous carcinoma of right fallopian tube: 6 cycles of q 3 week taxol carboplatin completed 03-28-13.  CT AP prior to reevaluation by Dr Duard Brady on 04-26-13.  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 Patient aware that weight loss would be beneficial prior to surgical repair. 4.PAC in: should be used and flushed with upcoming CT, then needs flush every 6-8 weeks. 5. lumbar disc disease symptomatic with LLE pain: Dr Coletta Memos to reevaluate.   6.elevated BP, reportedly better at home, continuing Maxzide. PCP will manage after patient is back at that office for PE. 7.obesity: may be eligible for diet/ exercise study. Wants to resume water exercise at [facility]. 8.mild thrombocytopenia still, other counts in good range now:  Follow off chemotherapy 9.sleep apnea, narcolepsy- type disorder, fibromyalgia. Back on Nuvigil per Dr Vickey Huger.  I will see her back coordinating with [facility] flush/ labs from [facility] in ~ late Oct, or sooner if needed. Patient understands and is in agreement with plan.      Avier Jech P, MD   04/17/2013, 9:29 AM

## 2013-04-17 NOTE — Telephone Encounter (Signed)
appts made and printed...td 

## 2013-04-17 NOTE — Patient Instructions (Signed)
Let Dr Precious Reel RN know if portacath is NOT used for the CT scan on 04-24-13, as we will need to schedule flush if it is not used then  We will refill BP meds from this office until you see Dr Corliss Blacker for PE, then she can help with this. Call if BP top # is <110-120  Dr Darrold Span will ask [facility] to see if you are eligible for the ideal weight/ exercise study

## 2013-04-17 NOTE — Telephone Encounter (Signed)
    Reece Packer, MD More Detail >>      Reece Packer, MD      Sent: Mon April 17, 2013  9:23 AM    To: Lorine Bears, RN; Phillis Knack, RN        [name]    MRN: [id number] DOB: 08/01/1946     Pt Home: [phone]               Message    Please refill BP med until she gets back to PCP [facility] [place], [place]    thanks

## 2013-04-18 ENCOUNTER — Encounter: Payer: Self-pay | Admitting: Oncology

## 2013-04-18 DIAGNOSIS — I10 Essential (primary) hypertension: Secondary | ICD-10-CM | POA: Insufficient documentation

## 2013-04-18 DIAGNOSIS — K439 Ventral hernia without obstruction or gangrene: Secondary | ICD-10-CM | POA: Insufficient documentation

## 2013-04-18 NOTE — Progress Notes (Signed)
[facility] END OF TREATMENT   Name: Kristin Webb Date: 04/18/2013 MRN: [id number] DOB: 1946/06/01   TREATMENT DATES: 12-13-2012 thru 03-28-13   REFERRING PHYSICIAN: Cleda Mccreedy  DIAGNOSIS: serous carcinoma of right fallopian tube  (also  mucinous borderline tumor of right ovary)  STAGE AT START OF TREATMENT: IIIB fallopian  (also IA ovarian)   INTENT: curative   DRUGS OR REGIMENS GIVEN: taxol carboplatin q 3 weeks x 6 cycles   MAJOR TOXICITIES: neutropenia, constipation, mild exacerbation of preexisting peripheral neuropathy   REASON TREATMENT STOPPED: completion of planned course   PERFORMANCE STATUS AT END: 1   ONGOING PROBLEMS: mild thrombocytopenia, peripheral neuropathy   FOLLOW UP PLANS: restaging CT, follow up with gyn oncology and medical oncology

## 2013-04-19 ENCOUNTER — Ambulatory Visit: Payer: Medicare Other | Admitting: Oncology

## 2013-04-21 ENCOUNTER — Other Ambulatory Visit (HOSPITAL_COMMUNITY): Payer: Self-pay | Admitting: Neurosurgery

## 2013-04-21 DIAGNOSIS — M5126 Other intervertebral disc displacement, lumbar region: Secondary | ICD-10-CM

## 2013-04-24 ENCOUNTER — Encounter (HOSPITAL_COMMUNITY): Payer: Self-pay

## 2013-04-24 ENCOUNTER — Ambulatory Visit (HOSPITAL_COMMUNITY)
Admission: RE | Admit: 2013-04-24 | Discharge: 2013-04-24 | Disposition: A | Discharge status: Home / Self Care | Payer: Medicare Other | Source: Ambulatory Visit | Attending: Oncology | Admitting: Oncology

## 2013-04-24 DIAGNOSIS — I82890 Acute embolism and thrombosis of other specified veins: Secondary | ICD-10-CM | POA: Insufficient documentation

## 2013-04-24 DIAGNOSIS — C5700 Malignant neoplasm of unspecified fallopian tube: Secondary | ICD-10-CM | POA: Insufficient documentation

## 2013-04-24 DIAGNOSIS — N200 Calculus of kidney: Secondary | ICD-10-CM | POA: Insufficient documentation

## 2013-04-24 DIAGNOSIS — N289 Disorder of kidney and ureter, unspecified: Secondary | ICD-10-CM | POA: Insufficient documentation

## 2013-04-24 DIAGNOSIS — C5701 Malignant neoplasm of right fallopian tube: Secondary | ICD-10-CM

## 2013-04-24 MED ORDER — IOHEXOL 300 MG/ML  SOLN
50.0000 mL | Freq: Once | INTRAMUSCULAR | Status: AC | PRN
  Administered 2013-04-24: 50 mL via ORAL

## 2013-04-24 MED ORDER — IOHEXOL 300 MG/ML  SOLN
100.0000 mL | Freq: Once | INTRAMUSCULAR | Status: AC | PRN
  Administered 2013-04-24: 100 mL via INTRAVENOUS

## 2013-04-25 ENCOUNTER — Ambulatory Visit: Payer: Medicare Other | Attending: Gynecologic Oncology | Admitting: Gynecologic Oncology

## 2013-04-25 ENCOUNTER — Encounter: Payer: Self-pay | Admitting: Gynecologic Oncology

## 2013-04-25 VITALS — BP 130/70 | HR 66 | Temp 98.7°F | Resp 16 | Ht 60.0 in | Wt 187.1 lb

## 2013-04-25 DIAGNOSIS — M519 Unspecified thoracic, thoracolumbar and lumbosacral intervertebral disc disorder: Secondary | ICD-10-CM | POA: Insufficient documentation

## 2013-04-25 DIAGNOSIS — C5700 Malignant neoplasm of unspecified fallopian tube: Secondary | ICD-10-CM | POA: Insufficient documentation

## 2013-04-25 DIAGNOSIS — N200 Calculus of kidney: Secondary | ICD-10-CM | POA: Insufficient documentation

## 2013-04-25 DIAGNOSIS — Z9089 Acquired absence of other organs: Secondary | ICD-10-CM | POA: Insufficient documentation

## 2013-04-25 DIAGNOSIS — Z9079 Acquired absence of other genital organ(s): Secondary | ICD-10-CM | POA: Insufficient documentation

## 2013-04-25 DIAGNOSIS — G4730 Sleep apnea, unspecified: Secondary | ICD-10-CM | POA: Insufficient documentation

## 2013-04-25 DIAGNOSIS — Z90710 Acquired absence of both cervix and uterus: Secondary | ICD-10-CM | POA: Insufficient documentation

## 2013-04-25 DIAGNOSIS — K219 Gastro-esophageal reflux disease without esophagitis: Secondary | ICD-10-CM | POA: Insufficient documentation

## 2013-04-25 DIAGNOSIS — E785 Hyperlipidemia, unspecified: Secondary | ICD-10-CM | POA: Insufficient documentation

## 2013-04-25 DIAGNOSIS — C569 Malignant neoplasm of unspecified ovary: Secondary | ICD-10-CM

## 2013-04-25 DIAGNOSIS — IMO0001 Reserved for inherently not codable concepts without codable children: Secondary | ICD-10-CM | POA: Insufficient documentation

## 2013-04-25 DIAGNOSIS — Z9221 Personal history of antineoplastic chemotherapy: Secondary | ICD-10-CM | POA: Insufficient documentation

## 2013-04-25 NOTE — Progress Notes (Signed)
Consult Note: Gyn-Onc  Kristin Webb 67 y.o. female  CC:  Chief Complaint  Patient presents with  . Ovarian Cancer    Follow up    HPI: Kristin Webb is a 67 year old, gravida 2 para 2, who noticed increasing abdominal swelling around the third week of January. Initially, the abdominal swelling was intermittent and would come and go. She related this to recent initiation of a calcium supplement. The abdominal swelling became more prevalent starting in February with abdominal pain reported also. In addition, she started having increasing constipation and formed stools, which was a change in her routine of 5-6 loose bowel movements a day chronically after her cholecystectomy. On November 01, 2012, she had a CT scan of the abdomen and pelvis which resulted no significant biliary dilation, no suspicious liver lesions, and the spleen, adrenal glands and pancreas appeared normal. 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 She has a large midabdominal mass measuring 17 x 22.7 cm x 20 cm cephalad, which was well circumscribed without calcifications. There is irregular thickened septations and areas of solid nodularity consistent with malignancy. There is no bowel obstruction and the mass appeared to be separate from the appendix. There was probable normal left ovarian tissue seen. The uterus is surgically absent. There is some soft tissue stranding at the base of the mesentery superior to the mass and some omental nodularity. She was then referred to Gynecologic Oncology.    On November 08, 2012, she underwent an exploratory laparotomy, BSO, appendectomy, infracolic omentectomy, and optimal debulking. Operative findings included: 25 cm right adnexal mass with smooth surface. Surgically absent uterus. Atrophic-appearing left ovary. Normal appearing appendix. Within the omentum there were centimeter nodules scattered throughout the omentum. The  remainder of the surfaces were benign. Final pathology revealed:  1. 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 - BENIGN FALLOPIAN TUBE WITH NONSPECIFIC CHRONIC INFLAMMATION. 2. Ovary and fallopian tube, left - BENIGN OVARY; NEGATIVE FOR ATYPIA OR MALIGNANCY. - BENIGN FALLOPIAN TUBE; NEGATIVE FOR ATYPIA OR MALIGNANCY. 3. Omentum, resection for tumor - HIGH GRADE CARCINOMA, SEE COMMENT. 4. Appendix, Other than Incidental - FIBROUS OBLITERATION OF APPENDICEAL TIP. - NEGATIVE FOR MALIGNANCY.  Her last CA 125 on 02/07/13 was 5.0. She recently completed day 1, cycle 6 of 6 planned treatments of carboplatin and taxol on 03/28/13. Her in August for concern of potential ventral wall hernia. She is now completed her chemotherapy and underwent a CT scan with that revealed:  Findings: The liver is diffusely fatty infiltrated and measures 18.7 cm in cranial caudal length. No focal intrahepatic parenchymal abnormality. The spleen is unremarkable. The stomach, duodenum, pancreas and adrenal glands are unremarkable. The gallbladder is surgically absent. 11 x 9 x 11 mm nonobstructing stone is identified in the interpolar right kidney. Stable appearance of a 9 mm fatty lesion in the right kidney, likely a tiny angiomyolipoma. No abdominal aortic aneurysm. No free fluid in the abdomen. No abdominal lymphadenopathy. Haziness in the root of the small bowel mesentery is stable. Approximately 8 cm cranial to the umbilicus is an area of apparent focal midline fascial laxity. I cannot identify a discrete fascial defect to suggest an overt hernia. This is a wide-mouthed fascial bulge and some of the transverse colon projects out into this area of fascial  bulging. No bowel wall thickening, adjacent edema, orfluid to suggest complication. Imaging through the pelvis shows no free  intraperitoneal fluid. No pelvic sidewall lymphadenopathy. The uterus is surgically absent. The large complex cystic lesion seen in the right central abdomen and pelvis has been resected in the interval. No substantial diverticular disease in the colon. There is no colonic diverticulitis. Terminal ileum is normal. The appendix is not visualized, but there is no edema or inflammation in the region of the cecum. The right gonadal vein is enlarged and contains a central filling defect consistent with thrombus. Bone windows reveal no worrisome lytic or sclerotic osseous lesions.  IMPRESSION:  Interval resection of the large right pelvic and lower abdominal mass lesion with apparent omentectomy. No evidence for intraperitoneal free fluid on today's study. No discernible peritoneal lesions. 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  Interval History:  As above  Review of Systems:  Constitutional: Feels tired during the day.  Her energy level is about 70%.   Denies fever. Skin: No rash, sores, jaundice, itching, or dryness.  Cardiovascular: No chest pain, shortness of breath, or edema  Pulmonary: No cough or wheeze.  Gastro Intestinal: No nausea, vomiting, constipation, or diarrhea reported. No bright red blood per rectum or change in bowel movement.  Genitourinary: No frequency, urgency, or dysuria.  Denies vaginal bleeding and discharge.  Musculoskeletal: No myalgia, arthralgia, joint swelling or pain. She has some back pain as well as leg pain. For the leg pain she started using a binder that help for the leg pain but that her back pain got worse. She is seeing Dr. Franky Macho in neurosurgery for this evaluation. Neurologic: No weakness, numbness, or change in gait.  Psychology: No changes   Current Meds:  Outpatient Encounter Prescriptions as of 04/25/2013  Medication Sig Dispense Refill  . Calcium Citrate-Vitamin D (CALCIUM CITRATE + D PO) Take 1 capsule by mouth daily.       . Cholecalciferol  (VITAMIN D3) 5000 UNITS CAPS Take 5,000 mg by mouth daily.       . CRESTOR 10 MG tablet       . diphenhydrAMINE (BENADRYL) 25 mg capsule Take 50-75 mg by mouth at bedtime as needed for sleep.       Marland Kitchen EPINEPHrine (EPIPEN 2-PAK) 0.3 mg/0.3 mL DEVI Inject 0.3 mg into the muscle once. For bee stings      . Eszopiclone (ESZOPICLONE) 3 MG TABS Take 3 mg by mouth at bedtime. Take immediately before bedtime      . fish oil-omega-3 fatty acids 1000 MG capsule Take 1 g by mouth 2 (two) times daily.      Marland Kitchen FLUoxetine (PROZAC) 10 MG capsule Take 30 mg by mouth every evening.      Marland Kitchen glucosamine-chondroitin 500-400 MG tablet Take 1 tablet by mouth 2 (two) times daily.      Marland Kitchen HYDROcodone-acetaminophen (NORCO/VICODIN) 5-325 MG per tablet Take 1 tab every 4-6 hours as needed for pain  20 tablet  0  . ibuprofen (ADVIL,MOTRIN) 200 MG tablet Take 200 mg by mouth every 6 (six) hours as needed for pain.      Marland Kitchen lidocaine-prilocaine (EMLA) cream Apply topically as needed. Apply to port 1 hour before access.  30 g  2  . Melatonin 10 MG TABS Take 10 mg by mouth at bedtime.      . Multiple Vitamin (MULTIVITAMIN WITH MINERALS) TABS Take 1 tablet by mouth daily.      Marland Kitchen NUVIGIL 250 MG tablet Take  by mouth daily.       . ondansetron (ZOFRAN) 8 MG tablet Take 1 tablet (8 mg total) by mouth every 12 (twelve) hours as needed. 1-2 tablets every 12 hours as needed for nausea after chemo  20 tablet  0  . OVER THE COUNTER MEDICATION Nystatin topical powder  100,000 units/ GM  Patient uses as needed      . prochlorperazine (COMPAZINE) 10 MG tablet 1 tablet every 6 hours as needed for nausea  20 tablet  0  . triamterene-hydrochlorothiazide (MAXZIDE-25) 37.5-25 MG per tablet Take 1 tablet by mouth daily.  30 tablet  1   No facility-administered encounter medications on file as of 04/25/2013.    Allergy:  Allergies  Allergen Reactions  . Bee Venom Hives    Difficulty breathing, carries an EPI-pen  . Cortisone     Injected  cortisone, "sick to my stomach, throwing up, hand swelling, and pain."    Social Hx:   History   Social History  . Marital Status: Married    Spouse Name: N/A    Number of Children: N/A  . Years of Education: N/A   Occupational History  . Not on file.   Social History Main Topics  . Smoking status: Never Smoker   . Smokeless tobacco: Never Used  . Alcohol Use: No  . Drug Use: No  . Sexual Activity: Not on file   Other Topics Concern  . Not on file   Social History Narrative  . No narrative on file    Past Surgical Hx:  Past Surgical History  Procedure Laterality Date  . Cholecystectomy      early 4s  . Abdominal hysterectomy      early 1990s  . Trigger finger release    . Laparotomy Bilateral 11/08/2012    Procedure: EXPLORATORY LAPAROTOMY TOTAL ABDOMINAL HYSTERECTOMY BILATERAL SALPINGO OOPHORECTOMY TUMOR DEBULKING ;  Surgeon: Rejeana Brock A. Duard Brady, MD;  Location: [facility];  Service: Gynecology;  Laterality: Bilateral;  APPENDECTOMY / OMENTECTOMY  . Appendectomy    . Exploratory laparotomy  11/08/12    BSO, appendectomy, omentectomy    Past Medical Hx:  Past Medical History  Diagnosis Date  . Pelvic mass in female   . Constipation   . Diarrhea     in the past after gallbladder removal  . Hyperlipidemia   . Burning mouth syndrome   . Chronic fatigue   . Fibromyalgia   . Lumbar disc disease   . PONV (postoperative nausea and vomiting)   . Hypertension     borderline not on meds   . Shortness of breath     with exertion   . Sleep apnea     CPAP settings at 13   . Pneumonia     hx of pneumonia   . GERD (gastroesophageal reflux disease)   . Yeast infection     Oncology Hx:  Oncology History   IIIB serous carcinoma of right fallopian tube, treated with optimal debulking 11-08-2012 and adjuvant chemotherapy     Ovarian ca   11/17/2012 Initial Diagnosis Ovarian ca    Fallopian tube carcinoma   11/01/2012 Initial Diagnosis Fallopian tube carcinoma   11/08/2012  Surgery Debulking, optima. IA ovarian and IIIB fallopian tube   12/13/2012 - 03/28/2013 Chemotherapy s/p 6 cycles of paclitaxel and carboplatin    Family Hx:  Family History  Problem Relation Age of Onset  . Lung cancer Mother   . Lung cancer Father     Vitals:  Blood pressure 130/70, pulse 66, temperature 98.7 F (37.1 C), resp. rate 16, height 5' (1.524 m), weight 187 lb 1.6 oz (84.868 kg).  Physical Exam: General: Well developed, well nourished female in no acute distress. Alert and oriented x 3.  Head/Neck: Sclerae anicteric with no excessive lacrimation. Oropharynx clear. Supple without any enlargements.  Lymph node survey: No cervical, supraclavicular, or inguinal adenopathy.  Cardiovascular: Regular rate and rhythm. S1 and S2 normal.  Lungs: Clear to auscultation bilaterally. No wheezes/crackles/rhonchi noted.  Skin: No rashes or lesions present.  Back: No CVA tenderness.  Abdomen: Abdomen soft, non-tender and obese. Active bowel sounds in all quadrants. No evidence of a fluid wave. Midline abdominal incision well healed. When patient sat up from a supine position, mild bulge noted in the upper abdomen appearing as an abdominal hernia.  Genitourinary:  Vulva/vagina: Normal external female genitalia. No lesions.  Urethra: No lesions or masses.  Vagina: Atrophic without any lesions. No palpable masses. No vaginal bleeding or drainage noted. Vaginal cuff intact.  Rectal: Good tone, no palpable masses or nodularity.  Extremities: No bilateral cyanosis, edema, or clubbing.     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 She was completely resected to an R0. She's completed her chemotherapy and had a post treatment CT scan that was negative. She does not have a hernia but doesn't appear to have some abdominal wall laxity. I believe some of this is due to a fairly  rapid weight gain. In the last 5 months she's gained approximately 15 pounds. She will start her exercise program again. We did discuss that she should be seen by genetic schedule that appointment for her. We also discussed the renal stone she will increase her by mouth fluid intake and she will followup with her primary care physician she began having symptoms or evidence of hematuria. She has an appointment to see Dr. Darrold Span in October and will return to see me in January 2015 her when necessary.  Olar Santini A., MD 04/25/2013, 3:45 PM

## 2013-04-25 NOTE — Patient Instructions (Signed)
Exercise to Lose Weight Exercise and a healthy diet may help you lose weight. Your doctor may suggest specific exercises. EXERCISE IDEAS AND TIPS  Choose low-cost things you enjoy doing, such as walking, bicycling, or exercising to workout videos.  Take stairs instead of the elevator.  Walk during your lunch break.  Park your car further away from work or school.  Go to a gym or an exercise class.  Start with 5 to 10 minutes of exercise each day. Build up to 30 minutes of exercise 4 to 6 days a week.  Wear shoes with good support and comfortable clothes.  Stretch before and after working out.  Work out until you breathe harder and your heart beats faster.  Drink extra water when you exercise.  Do not do so much that you hurt yourself, feel dizzy, or get very short of breath. Exercises that burn about 150 calories:  Running 1  miles in 15 minutes.  Playing volleyball for 45 to 60 minutes.  Washing and waxing a car for 45 to 60 minutes.  Playing touch football for 45 minutes.  Walking 1  miles in 35 minutes.  Pushing a stroller 1  miles in 30 minutes.  Playing basketball for 30 minutes.  Raking leaves for 30 minutes.  Bicycling 5 miles in 30 minutes.  Walking 2 miles in 30 minutes.  Dancing for 30 minutes.  Shoveling snow for 15 minutes.  Swimming laps for 20 minutes.  Walking up stairs for 15 minutes.  Bicycling 4 miles in 15 minutes.  Gardening for 30 to 45 minutes.  Jumping rope for 15 minutes.  Washing windows or floors for 45 to 60 minutes. Document Released: 08/29/2010 Document Revised: 10/19/2011 Document Reviewed: 08/29/2010 Exit[employer] Patient Information 2014 [employer]. Calorie Counting Diet A calorie counting diet requires you to eat the number of calories that are right for you in a day. Calories are the measurement of how much energy you get from the food you eat. Eating the right amount of calories is important for staying at a  healthy weight. If you eat too many calories, your body will store them as fat and you may gain weight. If you eat too few calories, you may lose weight. Counting the number of calories you eat during a day will help you know if you are eating the right amount. A Registered Dietitian can determine how many calories you need in a day. The amount of calories needed varies from person to person. If your goal is to lose weight, you will need to eat fewer calories. Losing weight can benefit you if you are overweight or have health problems such as heart disease, high blood pressure, or diabetes. If your goal is to gain weight, you will need to eat more calories. Gaining weight may be necessary if you have a certain health problem that causes your body to need more energy. TIPS Whether you are increasing or decreasing the number of calories you eat during a day, it may be hard to get used to changes in what you eat and drink. The following are tips to help you keep track of the number of calories you eat.  Measure foods at home with measuring cups. This helps you know the amount of food and number of calories you are eating.  Restaurants often serve food in amounts that are larger than 1 serving. While eating out, estimate how many servings of a food you are given. For example, a serving of cooked rice   is  cup or about the size of half of a fist. Knowing serving sizes will help you be aware of how much food you are eating at restaurants.  Ask for smaller portion sizes or child-size portions at restaurants.  Plan to eat half of a meal at a restaurant. Take the rest home or share the other half with a friend.  Read the Nutrition Facts panel on food labels for calorie content and serving size. You can find out how many servings are in a package, the size of a serving, and the number of calories each serving has.  For example, a package might contain 3 cookies. The Nutrition Facts panel on that package says  that 1 serving is 1 cookie. Below that, it will say there are 3 servings in the container. The calories section of the Nutrition Facts label says there are 90 calories. This means there are 90 calories in 1 cookie (1 serving). If you eat 1 cookie you have eaten 90 calories. If you eat all 3 cookies, you have eaten 270 calories (3 servings x 90 calories = 270 calories). The list below tells you how big or small some common portion sizes are.  1 oz.........4 stacked dice.  3 oz.........Deck of cards.  1 tsp........Tip of little finger.  1 tbs........Thumb.  2 tbs........Golf ball.   cup.......Half of a fist.  1 cup........A fist. KEEP A FOOD LOG Write down every food item you eat, the amount you eat, and the number of calories in each food you eat during the day. At the end of the day, you can add up the total number of calories you have eaten. It may help to keep a list like the one below. Find out the calorie information by reading the Nutrition Facts panel on food labels. Breakfast  Bran cereal (1 cup, 110 calories).  Fat-free milk ( cup, 45 calories). Snack  Apple (1 medium, 80 calories). Lunch  Spinach (1 cup, 20 calories).  Tomato ( medium, 20 calories).  Chicken breast strips (3 oz, 165 calories).  Shredded cheddar cheese ( cup, 110 calories).  Light Italian dressing (2 tbs, 60 calories).  Whole-wheat bread (1 slice, 80 calories).  Tub margarine (1 tsp, 35 calories).  Vegetable soup (1 cup, 160 calories). Dinner  Pork chop (3 oz, 190 calories).  Brown rice (1 cup, 215 calories).  Steamed broccoli ( cup, 20 calories).  Strawberries (1  cup, 65 calories).  Whipped cream (1 tbs, 50 calories). Daily Calorie Total: 1425 Document Released: 07/27/2005 Document Revised: 10/19/2011 Document Reviewed: 01/21/2007 Exit[employer] Patient Information 2014 [employer].  

## 2013-04-26 ENCOUNTER — Ambulatory Visit: Payer: Medicare Other | Admitting: Gynecologic Oncology

## 2013-04-26 ENCOUNTER — Telehealth: Payer: Self-pay | Admitting: Genetic Counselor

## 2013-04-26 NOTE — Telephone Encounter (Signed)
CALLED PT TO SCHEDULE PT WAS NOT AVAILABLE.

## 2013-04-27 ENCOUNTER — Ambulatory Visit (HOSPITAL_COMMUNITY)
Admission: RE | Admit: 2013-04-27 | Discharge: 2013-04-27 | Disposition: A | Discharge status: Home / Self Care | Payer: Medicare Other | Source: Ambulatory Visit | Attending: Neurosurgery | Admitting: Neurosurgery

## 2013-04-27 ENCOUNTER — Ambulatory Visit (HOSPITAL_COMMUNITY): Admission: RE | Admit: 2013-04-27 | Payer: Medicare Other | Source: Ambulatory Visit

## 2013-04-27 DIAGNOSIS — M5126 Other intervertebral disc displacement, lumbar region: Secondary | ICD-10-CM

## 2013-04-27 DIAGNOSIS — D1809 Hemangioma of other sites: Secondary | ICD-10-CM | POA: Insufficient documentation

## 2013-04-27 DIAGNOSIS — M79609 Pain in unspecified limb: Secondary | ICD-10-CM | POA: Insufficient documentation

## 2013-04-27 DIAGNOSIS — C569 Malignant neoplasm of unspecified ovary: Secondary | ICD-10-CM | POA: Insufficient documentation

## 2013-05-04 ENCOUNTER — Telehealth: Payer: Self-pay | Admitting: Neurology

## 2013-05-08 ENCOUNTER — Other Ambulatory Visit: Payer: Self-pay | Admitting: Neurology

## 2013-05-08 DIAGNOSIS — F5104 Psychophysiologic insomnia: Secondary | ICD-10-CM

## 2013-05-08 MED ORDER — ESZOPICLONE 3 MG PO TABS: 3.0000 mg | ORAL_TABLET | Freq: Every day | ORAL | Status: DC

## 2013-05-08 NOTE — Telephone Encounter (Signed)
I finally spoke with the patient.  She said she had been taking Lunesta 3 mg, and 1 mg must have been noted in error (see phone note from 06/18).  Says there was discussion in the past of possible changing the strength of the medication due to cost.  She is unsure what dose she should continue on at this time.  She would like to know if she should continue at 3 mg as before or if she should proceed with taking one half of the 2 mg nightly.  If she is to continue on 3mg , a new Rx will be needed.  Please advise.  Thank you.

## 2013-05-09 NOTE — Telephone Encounter (Signed)
Patient has a prescription for 3 mg , statistically 2 mg works almost as well as 3 mg, so she should try a lower dose for a week . There were 30 tabs prescribed. CD

## 2013-05-11 ENCOUNTER — Telehealth: Payer: Self-pay

## 2013-05-11 NOTE — Progress Notes (Signed)
I called patient and she states that the doctor prescribed 1 mg tablets that are to be cut in half. She was given 15 pills. The order should have been to take 2 mg tablets and take one at bedtime. Please adjust order. Thank you.

## 2013-05-11 NOTE — Telephone Encounter (Signed)
I called patient back. Correct order was faxed yesterday.

## 2013-05-12 ENCOUNTER — Encounter: Payer: Self-pay | Admitting: *Deleted

## 2013-05-15 ENCOUNTER — Telehealth: Payer: Self-pay | Admitting: Neurology

## 2013-05-16 ENCOUNTER — Other Ambulatory Visit: Payer: Self-pay | Admitting: Neurology

## 2013-05-16 DIAGNOSIS — G4710 Hypersomnia, unspecified: Secondary | ICD-10-CM

## 2013-05-16 MED ORDER — ARMODAFINIL 250 MG PO TABS: 250.0000 mg | ORAL_TABLET | Freq: Every day | ORAL | Status: DC

## 2013-05-16 NOTE — Telephone Encounter (Signed)
Patient would like to verify the correct RX for generic Lunesta and Nuvigil was [facility]. She is requesting the Rx for Lunesta does not state to "cut in half". Advised would fwd to pharm tech to f/u. Patient agreed.

## 2013-05-16 NOTE — Telephone Encounter (Signed)
As noted in previous message, a new Rx for Lunesta was sent saying one nightly.  Vikki Ports advised the patient of this.  I will send refill request for Nuvigil to Dr Dohmeier to authorize refill.  I am out of the office until the 13th, unable to call patient.

## 2013-05-17 ENCOUNTER — Telehealth: Payer: Self-pay | Admitting: *Deleted

## 2013-05-17 DIAGNOSIS — G4700 Insomnia, unspecified: Secondary | ICD-10-CM

## 2013-05-17 NOTE — Telephone Encounter (Signed)
Pt calling and lunesta not at [facility].  Pt requesting 2mg  tabs, # 30 tabs.  Take one tablet at bedtime.  Will address with Dr. Vickey Huger tomorrow and then call pt at her home #. May LM.

## 2013-05-17 NOTE — Telephone Encounter (Signed)
Thanks Shanda Bumps. I was confused. Fwd message to the wrong person.

## 2013-05-18 MED ORDER — ESZOPICLONE 2 MG PO TABS: 2.0000 mg | ORAL_TABLET | Freq: Every day | ORAL | Status: DC

## 2013-05-18 NOTE — Telephone Encounter (Signed)
Consulted Dr. Vickey Huger.  Ok'd generic Lunesta 2mg  tabs (take 1/2-1 tablet po qhs prn insomnia)   #30  With 2 RF.  [facility]axed [facility]. With confirmation [phone], [id number].  I called and spoke to [facility] with pharm and she did receive.   I called pt and she is aware.

## 2013-05-31 ENCOUNTER — Other Ambulatory Visit: Payer: Self-pay | Admitting: *Deleted

## 2013-05-31 DIAGNOSIS — C5701 Malignant neoplasm of right fallopian tube: Secondary | ICD-10-CM

## 2013-06-04 ENCOUNTER — Other Ambulatory Visit: Payer: Self-pay | Admitting: Oncology

## 2013-06-05 ENCOUNTER — Other Ambulatory Visit: Payer: Self-pay

## 2013-06-05 ENCOUNTER — Other Ambulatory Visit (HOSPITAL_BASED_OUTPATIENT_CLINIC_OR_DEPARTMENT_OTHER): Payer: Medicare Other | Admitting: Lab

## 2013-06-05 ENCOUNTER — Ambulatory Visit (HOSPITAL_BASED_OUTPATIENT_CLINIC_OR_DEPARTMENT_OTHER): Payer: Medicare Other | Admitting: Oncology

## 2013-06-05 ENCOUNTER — Encounter: Payer: Medicare Other | Admitting: *Deleted

## 2013-06-05 ENCOUNTER — Telehealth: Payer: Self-pay | Admitting: Oncology

## 2013-06-05 ENCOUNTER — Encounter (INDEPENDENT_AMBULATORY_CARE_PROVIDER_SITE_OTHER): Payer: Self-pay

## 2013-06-05 ENCOUNTER — Encounter: Payer: Self-pay | Admitting: *Deleted

## 2013-06-05 ENCOUNTER — Ambulatory Visit (HOSPITAL_BASED_OUTPATIENT_CLINIC_OR_DEPARTMENT_OTHER): Payer: Medicare Other

## 2013-06-05 ENCOUNTER — Encounter: Payer: Self-pay | Admitting: Oncology

## 2013-06-05 VITALS — BP 152/90 | HR 69 | Temp 98.0°F | Resp 18 | Ht 60.0 in | Wt 188.9 lb

## 2013-06-05 DIAGNOSIS — C569 Malignant neoplasm of unspecified ovary: Secondary | ICD-10-CM

## 2013-06-05 DIAGNOSIS — C5701 Malignant neoplasm of right fallopian tube: Secondary | ICD-10-CM

## 2013-06-05 DIAGNOSIS — C5700 Malignant neoplasm of unspecified fallopian tube: Secondary | ICD-10-CM

## 2013-06-05 DIAGNOSIS — Z23 Encounter for immunization: Secondary | ICD-10-CM

## 2013-06-05 DIAGNOSIS — I10 Essential (primary) hypertension: Secondary | ICD-10-CM

## 2013-06-05 LAB — CBC WITH DIFFERENTIAL/PLATELET
BASO%: 0.9 % (ref 0.0–2.0)
Basophils Absolute: 0 10*3/uL (ref 0.0–0.1)
EOS%: 2.2 % (ref 0.0–7.0)
Eosinophils Absolute: 0.1 10*3/uL (ref 0.0–0.5)
HCT: 42.3 % (ref 34.8–46.6)
HGB: 14.2 g/dL (ref 11.6–15.9)
LYMPH%: 36.5 % (ref 14.0–49.7)
MCH: 31.3 pg (ref 25.1–34.0)
MCHC: 33.5 g/dL (ref 31.5–36.0)
MCV: 93.3 fL (ref 79.5–101.0)
MONO#: 0.4 10*3/uL (ref 0.1–0.9)
MONO%: 8.4 % (ref 0.0–14.0)
NEUT#: 2.6 10*3/uL (ref 1.5–6.5)
NEUT%: 52 % (ref 38.4–76.8)
Platelets: 145 10*3/uL (ref 145–400)
RBC: 4.53 10*6/uL (ref 3.70–5.45)
RDW: 13.3 % (ref 11.2–14.5)
WBC: 5.1 10*3/uL (ref 3.9–10.3)
lymph#: 1.8 10*3/uL (ref 0.9–3.3)

## 2013-06-05 LAB — COMPREHENSIVE METABOLIC PANEL (CC13)
ALT: 31 U/L (ref 0–55)
AST: 20 U/L (ref 5–34)
Albumin: 4 g/dL (ref 3.5–5.0)
Alkaline Phosphatase: 73 U/L (ref 40–150)
Anion Gap: 12 mEq/L — ABNORMAL HIGH (ref 3–11)
BUN: 17.5 mg/dL (ref 7.0–26.0)
CO2: 25 mEq/L (ref 22–29)
Calcium: 10 mg/dL (ref 8.4–10.4)
Chloride: 104 mEq/L (ref 98–109)
Creatinine: 0.7 mg/dL (ref 0.6–1.1)
Glucose: 99 mg/dl (ref 70–140)
Potassium: 3.7 mEq/L (ref 3.5–5.1)
Sodium: 142 mEq/L (ref 136–145)
Total Bilirubin: 0.44 mg/dL (ref 0.20–1.20)
Total Protein: 7.5 g/dL (ref 6.4–8.3)

## 2013-06-05 LAB — RESEARCH LABS

## 2013-06-05 MED ORDER — SODIUM CHLORIDE 0.9 % IJ SOLN
10.0000 mL | INTRAMUSCULAR | Status: DC | PRN
  Administered 2013-06-05: 10 mL via INTRAVENOUS
  Filled 2013-06-05: qty 10

## 2013-06-05 MED ORDER — INFLUENZA VAC SPLIT QUAD 0.5 ML IM SUSP
0.5000 mL | INTRAMUSCULAR | Status: AC
  Administered 2013-06-05: 0.5 mL via INTRAMUSCULAR
  Filled 2013-06-05: qty 0.5

## 2013-06-05 MED ORDER — HEPARIN SOD (PORK) LOCK FLUSH 100 UNIT/ML IV SOLN
500.0000 [IU] | Freq: Once | INTRAVENOUS | Status: AC
  Administered 2013-06-05: 500 [IU] via INTRAVENOUS
  Filled 2013-06-05: qty 5

## 2013-06-05 MED ORDER — HEPARIN SOD (PORK) LOCK FLUSH 100 UNIT/ML IV SOLN
500.0000 [IU] | Freq: Once | INTRAVENOUS | Status: DC
  Filled 2013-06-05: qty 5

## 2013-06-05 MED ORDER — TRIAMTERENE-HCTZ 37.5-25 MG PO TABS: 1.0000 | ORAL_TABLET | Freq: Every day | ORAL | Status: DC

## 2013-06-05 MED ORDER — SODIUM CHLORIDE 0.9 % IJ SOLN
10.0000 mL | INTRAMUSCULAR | Status: DC | PRN
  Filled 2013-06-05: qty 10

## 2013-06-05 NOTE — Progress Notes (Signed)
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 She has no evidence of disease by CT AP 04-24-13; she saw Dr Duard Brady in Sept and will see her again in Jan. Patient is interested in GOG 225 study of exercise and diet after gyn cancer treatment. She has met with research RN for screening visit today, and hopefully will be enrolled on 06-06-13. Patient has felt generally well since she was here last, tho abdominal wall laxity (not true hernia by CT) is bothersome. She would like to discuss with PT in addition to the GOG study, particularly for recommendations to strengthen the abdominal muscles. She has no abdominal or pelvic pain, bowels move regularly, appetite is good without nausea or vomiting. She has had no bleeding and no recent infectious illness. She has PAC in, flushed today.  ONCOLOGIC HISTORY Patient presented in ~ Jan 2014 with complaints of abdominal distension and change in bowels. She had unremarkable colonoscopy July 2013 (not in this EMR, and patient does not recall which MD). She is post hysterectomy in 1990s. Abdominal symptoms did not improve with interventions for constipation, and CT AP was done in [facility] system 11-01-12 with finding of large mid abdominal mass. She was referred to Dr Duard Brady. Preop CA 125 is not available in this EMR; preop CXR had some linear scarring in lingula without acute findings. She went to exploratory laparotomy, bilateral salpingo-oophorectomy, appendectomy, and infacolic omentectomy, which was optimal debulking, by Dr Duard Brady on 11-08-2012. Operative findings: 25 cm right adnexal mass with  smooth surface. Surgically absent uterus. Atrophic-appearing left ovary. Within the omentum there were centimeter nodules scattered throughout. The remainder of the surfaces were benign. 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 Her post operative course was unremarkable. She saw Dr Duard Brady in follow up on 11-17-12, with recommendation for 6 cycles of taxol/ carboplatin. She had PAC placed by IR on 12-08-12 and cycle 1 taxol carbo 12-13-12; she was neutropenic by day 15 cycle 1 and has had neulasta day 2 with subsequent cycles. Cycle 6 was given 03-28-2013. Follow up CT AP 04-24-13 showed no apparent residual or metastatic disease.   Review of systems as above, also No new or different neurologic symptoms. No increased SOB or cough. No chest pain. No LE swelling. No problems with PAC. Bladder ok.  Remainder of 10 point Review of Systems negative/ unchanged.  ECOG performance status 1  Objective:  Vital signs in last 24 hours:  BP 152/90  Pulse 69  Temp(Src) 98 F (36.7 C) (Oral)  Resp 18  Ht 5' (1.524 m)  Wt 188 lb 14.4 oz (85.684 kg)  BMI 36.89 kg/m2  SpO2 100% Weight is stable from 04-17-13 Alert, oriented and appropriate. Ambulatory without assistance.  Hair is growing back  HEENT:PERRL, sclerae not icteric. Oral mucosa moist without lesions, posterior pharynx clear.  Neck supple. No JVD or thyroid mass Lymphatics:no cervical,suraclavicular, axillary or inguinal adenopathy Resp: clear to auscultation bilaterally and normal percussion bilaterally Cardio: regular rate and rhythm. No gallop. GI: abdomen obese, soft, nontender, no mass or organomegaly.Asymmetrical bulging RUQ > left. Normally active  bowel sounds. Surgical incision not remarkable. Musculoskeletal/ Extremities: without pitting edema, cords, tenderness Neuro: peripheral neuropathy unchanged. Otherwise nonfocal. Psych as above Skin without rash,  ecchymosis, petechiae Breasts: without dominant mass, skin or nipple findings. Axillae benign. Portacath-without erythema or tenderness  Lab Results:  Results for orders placed in visit on 06/05/13  CBC WITH DIFFERENTIAL      Result Value Range   WBC 5.1  3.9 - 10.3 10e3/uL   NEUT# 2.6  1.5 - 6.5 10e3/uL   HGB 14.2  11.6 - 15.9 g/dL   HCT 16.1  09.6 - 04.5 %   Platelets 145  145 - 400 10e3/uL   MCV 93.3  79.5 - 101.0 fL   MCH 31.3  25.1 - 34.0 pg   MCHC 33.5  31.5 - 36.0 g/dL   RBC 4.09  8.11 - 9.14 10e6/uL   RDW 13.3  11.2 - 14.5 %   lymph# 1.8  0.9 - 3.3 10e3/uL   MONO# 0.4  0.1 - 0.9 10e3/uL   Eosinophils Absolute 0.1  0.0 - 0.5 10e3/uL   Basophils Absolute 0.0  0.0 - 0.1 10e3/uL   NEUT% 52.0  38.4 - 76.8 %   LYMPH% 36.5  14.0 - 49.7 %   MONO% 8.4  0.0 - 14.0 %   EOS% 2.2  0.0 - 7.0 %   BASO% 0.9  0.0 - 2.0 %  COMPREHENSIVE METABOLIC PANEL (CC13)      Result Value Range   Sodium 142  136 - 145 mEq/L   Potassium 3.7  3.5 - 5.1 mEq/L   Chloride 104  98 - 109 mEq/L   CO2 25  22 - 29 mEq/L   Glucose 99  70 - 140 mg/dl   BUN 78.2  7.0 - 95.6 mg/dL   Creatinine 0.7  0.6 - 1.1 mg/dL   Total Bilirubin 2.13  0.20 - 1.20 mg/dL   Alkaline Phosphatase 73  40 - 150 U/L   AST 20  5 - 34 U/L   ALT 31  0 - 55 U/L   Total Protein 7.5  6.4 - 8.3 g/dL   Albumin 4.0  3.5 - 5.0 g/dL   Calcium 08.6  8.4 - 57.8 mg/dL   Anion Gap 12 (*) 3 - 11 mEq/L  RESEARCH LABS      Result Value Range   Research Labs Collected.     CA 125 available after visit 5.1  Studies/Results:  CT ABDOMEN AND PELVIS WITH CONTRAST 04-24-13  Comparison: 11/01/2012  Findings: The liver is diffusely fatty infiltrated and measures  18.7 cm in cranial caudal length. No focal intrahepatic  parenchymal abnormality. The spleen is unremarkable. The stomach,  duodenum, pancreas and adrenal glands are unremarkable. The  gallbladder is surgically absent. 11 x 9 x 11 mm nonobstructing  stone is identified in the  interpolar right kidney. 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  No abdominal aortic aneurysm. No free fluid in the abdomen. No  abdominal lymphadenopathy. Haziness in the root of the small bowel  mesentery is stable.  Approximately 8 cm cranial to the umbilicus is an area of apparent  focal midline fascial laxity. I cannot identify a discrete fascial  defect to suggest an overt hernia. This is a wide-mouthed fascial  bulge and some of the transverse colon projects out into this area  of fascial bulging. No bowel wall thickening, adjacent edema, or  fluid to suggest complication.  Imaging through the pelvis shows no free intraperitoneal fluid. No  pelvic sidewall lymphadenopathy. The uterus is surgically absent.  The large complex cystic lesion seen in the right central abdomen  and pelvis has been resected in the interval.  No substantial diverticular disease in the colon. There is no  colonic diverticulitis. Terminal ileum is normal. The appendix is  not visualized, but there is no edema or inflammation in the region  of the cecum.  The right gonadal vein is enlarged and contains a central filling  defect consistent with thrombus.  Bone windows reveal no worrisome lytic or sclerotic osseous  lesions.  IMPRESSION:  Interval resection of the large right pelvic and lower abdominal  mass lesion with apparent omentectomy. No evidence for  intraperitoneal free fluid on today's study. No discernible  peritoneal lesions.  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  Medications: I have reviewed the patient's current medications. Flu vaccine given today.  I have refilled the maxzide-25 x 1 month, to allow her to get back to PCP, who will likely manage this subsequently  DISCUSSION: GOG study anticipated as above. PT referral due to marked laxity of abdominal muscles, without frank ventral hernia. Referral to genetics counseling as  suggested by Dr Duard Brady.  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 We expect she will begin GOG 225 diet and exercise study. She is to see Dr Duard Brady next in Jan, and I will try to coordinate further visits at this office with PAC flushes, tho I am glad to see her otherwise if needed.Genetics counseling requested.    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 Patient aware that weight loss would be beneficial prior to surgical repair. Will refer also to outpatient PT for exercises to strengthen abdominal wall muscles  4.PAC in:  needs flush every 6-8 weeks.  5. lumbar disc disease symptomatic with LLE pain: Dr Coletta Memos aware  6.elevated BP, reportedly better at home, continuing Maxzide. PCP will manage after patient is back at that office for PE.  7.obesity: to begin GOG 225 diet and exercise study.  8.counts all improving off chemo,  follow  9.sleep apnea, narcolepsy- type disorder, fibromyalgia. Back on Nuvigil per Dr Vickey Huger.  10.flu vaccine given now   Patient is in agreement with plan and has had questions answered to her satisfaction      Reece Packer, MD   06/05/2013, 1:55 PM

## 2013-06-06 ENCOUNTER — Telehealth: Payer: Self-pay | Admitting: *Deleted

## 2013-06-06 ENCOUNTER — Other Ambulatory Visit: Payer: Self-pay | Admitting: *Deleted

## 2013-06-06 LAB — CA 125: CA 125: 5.1 U/mL (ref 0.0–30.2)

## 2013-06-06 NOTE — Telephone Encounter (Signed)
Spoke with pt today and informed her re:  CA 125  Results in good range  5.1  As per Dr. Precious Reel instructions.  Pt aware that she will be contacted for f/u appt with md in April 2015.

## 2013-06-07 ENCOUNTER — Telehealth: Payer: Self-pay

## 2013-06-07 ENCOUNTER — Ambulatory Visit: Payer: Medicare Other | Attending: Oncology | Admitting: Physical Therapy

## 2013-06-07 DIAGNOSIS — R5381 Other malaise: Secondary | ICD-10-CM | POA: Insufficient documentation

## 2013-06-07 DIAGNOSIS — IMO0001 Reserved for inherently not codable concepts without codable children: Secondary | ICD-10-CM | POA: Insufficient documentation

## 2013-06-07 DIAGNOSIS — M60009 Infective myositis, unspecified site: Secondary | ICD-10-CM | POA: Insufficient documentation

## 2013-06-07 NOTE — Telephone Encounter (Signed)
Faxed signed evaluation and treatment plan for physical therapy dated 06-07-13.  Sent a copy to medical records to be scanned into patient's EMR.

## 2013-06-07 NOTE — Telephone Encounter (Signed)
Message copied by Lorine Bears on Wed Jun 07, 2013  9:30 AM ------      Message from: Reece Packer      Created: Tue Jun 06, 2013 10:11 AM       Labs seen and need follow up: please let her know ca 125 in good range at 5.1 ------

## 2013-06-07 NOTE — Telephone Encounter (Signed)
Told patient results  Of ca-125 as noted below by Dr. Darrold Span.

## 2013-06-09 ENCOUNTER — Encounter: Payer: Self-pay | Admitting: *Deleted

## 2013-06-09 NOTE — Progress Notes (Signed)
06/09/13 at 1:17pm -  Consent note clarification.  This patient was not presented with an updated hipaa form on 06/06/13.  The was an error in documentation.   The pt signed the hipaa form on 06/05/13 along with her informed consent document.   Janan Ridge RN, BSN Clinical Research Nurse 06/09/2013 1:23 PM

## 2013-06-13 ENCOUNTER — Ambulatory Visit: Payer: Medicare Other | Attending: Oncology | Admitting: Physical Therapy

## 2013-06-13 DIAGNOSIS — M60009 Infective myositis, unspecified site: Secondary | ICD-10-CM | POA: Insufficient documentation

## 2013-06-13 DIAGNOSIS — IMO0001 Reserved for inherently not codable concepts without codable children: Secondary | ICD-10-CM | POA: Insufficient documentation

## 2013-06-13 DIAGNOSIS — R5381 Other malaise: Secondary | ICD-10-CM | POA: Insufficient documentation

## 2013-06-15 ENCOUNTER — Ambulatory Visit: Payer: Medicare Other | Admitting: Physical Therapy

## 2013-06-20 ENCOUNTER — Ambulatory Visit: Payer: Medicare Other | Admitting: Physical Therapy

## 2013-06-22 ENCOUNTER — Ambulatory Visit: Payer: Medicare Other | Admitting: Physical Therapy

## 2013-06-26 ENCOUNTER — Telehealth: Payer: Self-pay | Admitting: *Deleted

## 2013-06-26 NOTE — Telephone Encounter (Signed)
06/26/13 at 1:14pm The research nurse called the pt to inform her of her month 3 appointments in January 2015.  The research nurse asked the pt if she had received all of her study supplies.  The pt said "yes", but she said that no one from the study has called her to talk about her participation.  The research nurse stated that there should have been a contact person in the shipment for her to call when she received her supplies.  The pt went through her box, and she found the contact person's information.  The pt apologized for not going through the box.  The pt was strongly encouraged to call the person today to let them know that she has her supplies so that she can begin her participation on the study.  The pt verbalized understanding.  The pt is aware of her January 2015 appointments.  The pt was encouraged to call the research nurse back if she has any problems reaching the contact person.

## 2013-06-27 ENCOUNTER — Ambulatory Visit: Payer: Medicare Other | Admitting: Physical Therapy

## 2013-06-29 ENCOUNTER — Ambulatory Visit: Payer: Medicare Other | Admitting: Physical Therapy

## 2013-07-04 ENCOUNTER — Ambulatory Visit: Payer: Medicare Other | Admitting: Physical Therapy

## 2013-07-11 ENCOUNTER — Ambulatory Visit: Payer: Medicare Other | Attending: Oncology | Admitting: Physical Therapy

## 2013-07-11 DIAGNOSIS — R5381 Other malaise: Secondary | ICD-10-CM | POA: Insufficient documentation

## 2013-07-11 DIAGNOSIS — M60009 Infective myositis, unspecified site: Secondary | ICD-10-CM | POA: Insufficient documentation

## 2013-07-11 DIAGNOSIS — IMO0001 Reserved for inherently not codable concepts without codable children: Secondary | ICD-10-CM | POA: Insufficient documentation

## 2013-07-13 ENCOUNTER — Ambulatory Visit: Payer: Medicare Other | Admitting: Physical Therapy

## 2013-07-18 ENCOUNTER — Ambulatory Visit: Payer: Medicare Other | Admitting: Physical Therapy

## 2013-07-18 ENCOUNTER — Ambulatory Visit (HOSPITAL_BASED_OUTPATIENT_CLINIC_OR_DEPARTMENT_OTHER): Payer: Medicare Other

## 2013-07-18 VITALS — BP 143/77 | HR 92 | Temp 97.9°F

## 2013-07-18 DIAGNOSIS — Z452 Encounter for adjustment and management of vascular access device: Secondary | ICD-10-CM

## 2013-07-18 DIAGNOSIS — C5700 Malignant neoplasm of unspecified fallopian tube: Secondary | ICD-10-CM

## 2013-07-18 DIAGNOSIS — C5701 Malignant neoplasm of right fallopian tube: Secondary | ICD-10-CM

## 2013-07-18 MED ORDER — HEPARIN SOD (PORK) LOCK FLUSH 100 UNIT/ML IV SOLN
500.0000 [IU] | Freq: Once | INTRAVENOUS | Status: AC
  Administered 2013-07-18: 500 [IU] via INTRAVENOUS
  Filled 2013-07-18: qty 5

## 2013-07-18 MED ORDER — SODIUM CHLORIDE 0.9 % IJ SOLN
10.0000 mL | INTRAMUSCULAR | Status: DC | PRN
  Administered 2013-07-18: 10 mL via INTRAVENOUS
  Filled 2013-07-18: qty 10

## 2013-07-18 NOTE — Patient Instructions (Signed)
Implanted Port Instructions  An implanted port is a central line that has a round shape and is placed under the skin. It is used for long-term IV (intravenous) access for:  · Medicine.  · Fluids.  · Liquid nutrition, such as TPN (total parenteral nutrition).  · Blood samples.  Ports can be placed:  · In the chest area just below the collarbone (this is the most common place.)  · In the arms.  · In the belly (abdomen) area.  · In the legs.  PARTS OF THE PORT  A port has 2 main parts:  · The reservoir. The reservoir is round, disc-shaped, and will be a small, raised area under your skin.  · The reservoir is the part where a needle is inserted (accessed) to either give medicines or to draw blood.  · The catheter. The catheter is a long, slender tube that extends from the reservoir. The catheter is placed into a large vein.  · Medicine that is inserted into the reservoir goes into the catheter and then into the vein.  INSERTION OF THE PORT  · The port is surgically placed in either an operating room or in a procedural area (interventional radiology).  · Medicine may be given to help you relax during the procedure.  · The skin where the port will be inserted is numbed (local anesthetic).  · 1 or 2 small cuts (incisions) will be made in the skin to insert the port.  · The port can be used after it has been inserted.  INCISION SITE CARE  · The incision site may have small adhesive strips on it. This helps keep the incision site closed. Sometimes, no adhesive strips are placed. Instead of adhesive strips, a special kind of surgical glue is used to keep the incision closed.  · If adhesive strips were placed on the incision sites, do not take them off. They will fall off on their own.  · The incision site may be sore for 1 to 2 days. Pain medicine can help.  · Do not get the incision site wet. Bathe or shower as directed by your caregiver.  · The incision site should heal in 5 to 7 days. A small scar may form after the  incision has healed.  ACCESSING THE PORT  Special steps must be taken to access the port:  · Before the port is accessed, a numbing cream can be placed on the skin. This helps numb the skin over the port site.  · A sterile technique is used to access the port.  · The port is accessed with a needle. Only "non-coring" port needles should be used to access the port. Once the port is accessed, a blood return should be checked. This helps ensure the port is in the vein and is not clogged (clotted).  · If your caregiver believes your port should remain accessed, a clear (transparent) bandage will be placed over the needle site. The bandage and needle will need to be changed every week or as directed by your caregiver.  · Keep the bandage covering the needle clean and dry. Do not get it wet. Follow your caregiver's instructions on how to take a shower or bath when the port is accessed.  · If your port does not need to stay accessed, no bandage is needed over the port.  FLUSHING THE PORT  Flushing the port keeps it from getting clogged. How often the port is flushed depends on:  · If a   constant infusion is running. If a constant infusion is running, the port may not need to be flushed.  · If intermittent medicines are given.  · If the port is not being used.  For intermittent medicines:  · The port will need to be flushed:  · After medicines have been given.  · After blood has been drawn.  · As part of routine maintenance.  · A port is normally flushed with:  · Normal saline.  · Heparin.  · Follow your caregiver's advice on how often, how much, and the type of flush to use on your port.  IMPORTANT PORT INFORMATION  · Tell your caregiver if you are allergic to heparin.  · After your port is placed, you will get a manufacturer's information card. The card has information about your port. Keep this card with you at all times.  · There are many types of ports available. Know what kind of port you have.  · In case of an  emergency, it may be helpful to wear a medical alert bracelet. This can help alert health care workers that you have a port.  · The port can stay in for as long as your caregiver believes it is necessary.  · When it is time for the port to come out, surgery will be done to remove it. The surgery will be similar to how the port was put in.  · If you are in the hospital or clinic:  · Your port will be taken care of and flushed by a nurse.  · If you are at home:  · A home health care nurse may give medicines and take care of the port.  · You or a family member can get special training and directions for giving medicine and taking care of the port at home.  SEEK IMMEDIATE MEDICAL CARE IF:   · Your port does not flush or you are unable to get a blood return.  · New drainage or pus is coming from the incision.  · A bad smell is coming from the incision site.  · You develop swelling or increased redness at the incision site.  · You develop increased swelling or pain at the port site.  · You develop swelling or pain in the surrounding skin near the port.  · You have an oral temperature above 102° F (38.9° C), not controlled by medicine.  MAKE SURE YOU:   · Understand these instructions.  · Will watch your condition.  · Will get help right away if you are not doing well or get worse.  Document Released: 07/27/2005 Document Revised: 10/19/2011 Document Reviewed: 10/18/2008  [employer]® Patient Information ©2014 [employer].

## 2013-07-19 ENCOUNTER — Telehealth: Payer: Self-pay

## 2013-07-19 NOTE — Telephone Encounter (Signed)
Faxed signed orders for [facility] Therapy Services dated 07-17-13.   Sent a copy to HIM to be scanned into patient's EMR.

## 2013-07-20 ENCOUNTER — Ambulatory Visit: Payer: Medicare Other | Admitting: Physical Therapy

## 2013-07-24 ENCOUNTER — Ambulatory Visit: Payer: Medicare Other | Admitting: Physical Therapy

## 2013-07-26 ENCOUNTER — Ambulatory Visit: Payer: Medicare Other | Admitting: Physical Therapy

## 2013-08-08 ENCOUNTER — Ambulatory Visit: Payer: Medicare Other | Admitting: Physical Therapy

## 2013-08-18 ENCOUNTER — Telehealth: Payer: Self-pay | Admitting: *Deleted

## 2013-08-18 NOTE — Telephone Encounter (Addendum)
AS OF 08/10/13 PT. HAS [employer] INSURANCE. [employer] HAS CONTRACTED WITH [facility] [facility]. PT. IS REQUIRED TO HAVE A REFERRAL FROM HER PRIMARY CARE PHYSICIAN TO SEE A SPECIALIST. REQUESTED [facility] TO FAX THE REFERRAL FORM TO PT.'S PCP, DR.WENDY MCNEILL. CALLED DR.MCNEILL'S OFFICE TO ALERT THE STAFF OF THE REFERRAL FORM TO BE COMPLETED FOR PT. TO SEE DR.[name]. AFTER REFERRAL FORM IS RECEIVED FROM DR.MCNEILL. DR.[name] WILL NEED TO COMPLETE THE [facility] FORM TO PRIOR AUTHORIZE PT.'S PHYSICAL THERAPY. KEISHA EXPLAINED THE SITUATION TO THE PATIENT AND RESCHEDULED PT.'S APPOINTMENT TO Monday,08/28/13. THIS NOTE AND [facility] FORM TO DR.[name]'S NURSE'S DESK.

## 2013-08-22 ENCOUNTER — Telehealth: Payer: Self-pay | Admitting: Oncology

## 2013-08-22 ENCOUNTER — Encounter: Payer: Medicare Other | Admitting: Physical Therapy

## 2013-08-22 NOTE — Telephone Encounter (Signed)
returned pt call...pt not home per husband...he will have her call back

## 2013-08-23 ENCOUNTER — Telehealth: Payer: Self-pay | Admitting: Oncology

## 2013-08-23 NOTE — Telephone Encounter (Signed)
returned pt call and confrimed genetices appt d.t

## 2013-08-28 ENCOUNTER — Ambulatory Visit: Payer: Medicare HMO | Admitting: Physical Therapy

## 2013-09-06 ENCOUNTER — Other Ambulatory Visit: Payer: Self-pay | Admitting: Oncology

## 2013-09-06 DIAGNOSIS — C5700 Malignant neoplasm of unspecified fallopian tube: Secondary | ICD-10-CM

## 2013-09-07 ENCOUNTER — Encounter (INDEPENDENT_AMBULATORY_CARE_PROVIDER_SITE_OTHER): Payer: Self-pay

## 2013-09-07 ENCOUNTER — Ambulatory Visit (HOSPITAL_BASED_OUTPATIENT_CLINIC_OR_DEPARTMENT_OTHER): Payer: Commercial Managed Care - HMO | Admitting: Genetic Counselor

## 2013-09-07 ENCOUNTER — Ambulatory Visit (HOSPITAL_BASED_OUTPATIENT_CLINIC_OR_DEPARTMENT_OTHER): Payer: Commercial Managed Care - HMO

## 2013-09-07 ENCOUNTER — Other Ambulatory Visit: Payer: Commercial Managed Care - HMO

## 2013-09-07 ENCOUNTER — Other Ambulatory Visit: Payer: Self-pay | Admitting: Neurology

## 2013-09-07 ENCOUNTER — Ambulatory Visit: Payer: Medicare Other | Admitting: Gynecologic Oncology

## 2013-09-07 ENCOUNTER — Encounter: Payer: Self-pay | Admitting: Genetic Counselor

## 2013-09-07 VITALS — BP 147/101 | HR 100 | Temp 97.6°F

## 2013-09-07 DIAGNOSIS — Z95828 Presence of other vascular implants and grafts: Secondary | ICD-10-CM

## 2013-09-07 DIAGNOSIS — Z802 Family history of malignant neoplasm of other respiratory and intrathoracic organs: Secondary | ICD-10-CM

## 2013-09-07 DIAGNOSIS — C569 Malignant neoplasm of unspecified ovary: Secondary | ICD-10-CM | POA: Insufficient documentation

## 2013-09-07 DIAGNOSIS — C5700 Malignant neoplasm of unspecified fallopian tube: Secondary | ICD-10-CM

## 2013-09-07 DIAGNOSIS — IMO0002 Reserved for concepts with insufficient information to code with codable children: Secondary | ICD-10-CM

## 2013-09-07 DIAGNOSIS — Z452 Encounter for adjustment and management of vascular access device: Secondary | ICD-10-CM

## 2013-09-07 LAB — CA 125: CA 125: 3.1 U/mL (ref 0.0–30.2)

## 2013-09-07 MED ORDER — SODIUM CHLORIDE 0.9 % IJ SOLN
10.0000 mL | INTRAMUSCULAR | Status: DC | PRN
  Administered 2013-09-07: 10 mL via INTRAVENOUS
  Filled 2013-09-07: qty 10

## 2013-09-07 MED ORDER — HEPARIN SOD (PORK) LOCK FLUSH 100 UNIT/ML IV SOLN
500.0000 [IU] | Freq: Once | INTRAVENOUS | Status: AC
  Administered 2013-09-07: 500 [IU] via INTRAVENOUS
  Filled 2013-09-07: qty 5

## 2013-09-07 NOTE — Patient Instructions (Signed)

## 2013-09-07 NOTE — Telephone Encounter (Signed)
Dr Dohmeier is out of the office,  Forwarding request to [facility] 

## 2013-09-07 NOTE — Progress Notes (Signed)
Dr.  Evlyn Clines requested a consultation for genetic counseling and risk assessment for Kristin Webb, a 68 y.o. female, for discussion of her personal history of fallopian tube cancer.  She presents to clinic today to discuss the possibility of a genetic predisposition to cancer, and to further clarify her risks, as well as her family members' risks for cancer.   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 This was treated with chemotherapy, surgery.    Past Medical History  Diagnosis Date  . Pelvic mass in female   . Constipation   . Diarrhea     in the past after gallbladder removal  . Hyperlipidemia   . Burning mouth syndrome   . Chronic fatigue   . Fibromyalgia   . Lumbar disc disease   . PONV (postoperative nausea and vomiting)   . Hypertension     borderline not on meds   . Shortness of breath     with exertion   . Sleep apnea     CPAP settings at 13   . Pneumonia     hx of pneumonia   . GERD (gastroesophageal reflux disease)   . Yeast infection   . Ovarian cancer 2014    Past Surgical History  Procedure Laterality Date  . Cholecystectomy      early 28s  . Abdominal hysterectomy      early 1990s  . Trigger finger release    . Laparotomy Bilateral 11/08/2012    Procedure: EXPLORATORY LAPAROTOMY TOTAL ABDOMINAL HYSTERECTOMY BILATERAL SALPINGO OOPHORECTOMY TUMOR DEBULKING ;  Surgeon: Imagene Gurney A. Alycia Rossetti, MD;  Location: [facility];  Service: Gynecology;  Laterality: Bilateral;  APPENDECTOMY / OMENTECTOMY  . Appendectomy    . Exploratory laparotomy  11/08/12    BSO, appendectomy, omentectomy    History   Social History  . Marital Status: Married    Spouse Name: N/A    Number of Children: 2  . Years of Education: N/A   Social History Main Topics  . Smoking status: Never Smoker   . Smokeless tobacco: Never Used  . Alcohol Use: No  . Drug Use: No  . Sexual Activity: None   Other  Topics Concern  . None   Social History Narrative  . None    REPRODUCTIVE HISTORY AND PERSONAL RISK ASSESSMENT FACTORS: Menarche was at age 46.   postmenopausal Uterus Intact: no, removed in late 51s due to fibroids Ovaries Intact: no G2P2A0, first live birth at age 43  She has not previously undergone treatment for infertility.   Oral Contraceptive use: 12 years   She has used HRT in the past.    FAMILY HISTORY:  We obtained a detailed, 4-generation family history.  Significant diagnoses are listed below: Family History  Problem Relation Age of Onset  . Lung cancer Mother 19  . Lung cancer Father 8    2 ppd smoker  Kristin Webb reports that she is not aware of other cancers in the family, although she reports that she would not have been told of cancer in the family when she was growing up.    Patient's maternal ancestors are of [place] descent, and paternal ancestors are of [place] descent. There is reported Ashkenazi Jewish ancestry. There is no known consanguinity.  GENETIC COUNSELING ASSESSMENT: Kristin Webb is a 68 y.o. female with a fallopian tube cancer which somewhat suggestive of a hereditary cancer syndrome and predisposition to cancer. We,  therefore, discussed and recommended the following at today's visit.   DISCUSSION: We reviewed the characteristics, features and inheritance patterns of hereditary cancer syndromes. We also discussed genetic testing, including the appropriate family members to test, the process of testing, insurance coverage and turn-around-time for results. We discussed the increased risk for BRCA mutations based on her cancer diagnosis and the Jewish ancestery.  We also discussed other hereditary cancer syndromes based on an ovarian cancer diagnosis.  PLAN: After considering the risks, benefits, and limitations, Kristin Webb provided informed consent to pursue genetic testing and the blood sample will be sent to [facility] for analysis of  the Breast/Ovarian cancer panel. We discussed the implications of a positive, negative and/ or variant of uncertain significance genetic test result. Results should be available within approximately 3 weeks' time, at which point they will be disclosed by telephone to Kristin Webb, as will any additional recommendations warranted by these results. Kristin Webb will receive a summary of her genetic counseling visit and a copy of her results once available. This information will also be available in Epic. We encouraged Kristin Webb to remain in contact with cancer genetics annually so that we can continuously update the family history and inform her of any changes in cancer genetics and testing that may be of benefit for her family. Kristin Webb questions were answered to her satisfaction today. Our contact information was provided should additional questions or concerns arise.  The patient was seen for a total of 60 minutes, greater than 50% of which was spent face-to-face counseling.  This note will also be sent to the referring provider via the electronic medical record. The patient will be supplied with a summary of this genetic counseling discussion as well as educational information on the discussed hereditary cancer syndromes following the conclusion of their visit.   Patient was discussed with Dr. Marcy Panning.   _______________________________________________________________________ For Office Staff:  Number of people involved in session: 1 Was an Intern/ student involved with case: no

## 2013-09-08 ENCOUNTER — Other Ambulatory Visit: Payer: Self-pay | Admitting: Neurology

## 2013-09-08 NOTE — Telephone Encounter (Signed)
Rx signed and faxed.

## 2013-09-13 ENCOUNTER — Encounter: Payer: Self-pay | Admitting: *Deleted

## 2013-09-13 ENCOUNTER — Encounter (INDEPENDENT_AMBULATORY_CARE_PROVIDER_SITE_OTHER): Payer: Self-pay

## 2013-09-13 ENCOUNTER — Encounter: Payer: Self-pay | Admitting: Gynecologic Oncology

## 2013-09-13 ENCOUNTER — Other Ambulatory Visit: Payer: Self-pay | Admitting: Neurology

## 2013-09-13 ENCOUNTER — Ambulatory Visit: Payer: Medicare HMO | Attending: Gynecologic Oncology | Admitting: Gynecologic Oncology

## 2013-09-13 VITALS — BP 174/89 | HR 94 | Temp 98.1°F | Resp 16 | Wt 194.1 lb

## 2013-09-13 DIAGNOSIS — N200 Calculus of kidney: Secondary | ICD-10-CM | POA: Insufficient documentation

## 2013-09-13 DIAGNOSIS — C569 Malignant neoplasm of unspecified ovary: Secondary | ICD-10-CM | POA: Insufficient documentation

## 2013-09-13 DIAGNOSIS — Z79899 Other long term (current) drug therapy: Secondary | ICD-10-CM | POA: Insufficient documentation

## 2013-09-13 DIAGNOSIS — E785 Hyperlipidemia, unspecified: Secondary | ICD-10-CM | POA: Insufficient documentation

## 2013-09-13 DIAGNOSIS — IMO0001 Reserved for inherently not codable concepts without codable children: Secondary | ICD-10-CM | POA: Insufficient documentation

## 2013-09-13 DIAGNOSIS — K219 Gastro-esophageal reflux disease without esophagitis: Secondary | ICD-10-CM | POA: Insufficient documentation

## 2013-09-13 DIAGNOSIS — C5700 Malignant neoplasm of unspecified fallopian tube: Secondary | ICD-10-CM

## 2013-09-13 DIAGNOSIS — Z90710 Acquired absence of both cervix and uterus: Secondary | ICD-10-CM | POA: Insufficient documentation

## 2013-09-13 DIAGNOSIS — Z9079 Acquired absence of other genital organ(s): Secondary | ICD-10-CM | POA: Insufficient documentation

## 2013-09-13 DIAGNOSIS — Z9089 Acquired absence of other organs: Secondary | ICD-10-CM | POA: Insufficient documentation

## 2013-09-13 DIAGNOSIS — Z9221 Personal history of antineoplastic chemotherapy: Secondary | ICD-10-CM | POA: Insufficient documentation

## 2013-09-13 DIAGNOSIS — D3000 Benign neoplasm of unspecified kidney: Secondary | ICD-10-CM | POA: Insufficient documentation

## 2013-09-13 DIAGNOSIS — I10 Essential (primary) hypertension: Secondary | ICD-10-CM | POA: Insufficient documentation

## 2013-09-13 DIAGNOSIS — I82890 Acute embolism and thrombosis of other specified veins: Secondary | ICD-10-CM | POA: Insufficient documentation

## 2013-09-13 DIAGNOSIS — R1909 Other intra-abdominal and pelvic swelling, mass and lump: Secondary | ICD-10-CM | POA: Insufficient documentation

## 2013-09-13 NOTE — Progress Notes (Signed)
09/13/13 at 4:07pm - GOG 225 - month 3 visit- The pt was into the cancer center this morning for her month 3 assessments.  The pt's weight was taken (shoeless), and her waist measurement was measured ( according to section 4.61 of the protocol).  The pt's waist measurement using the study provided Gulick II tape measure was 128.3 cm (50.5 inches).  The pt said that she has a hernia and will probably have hernia repair surgery in the fall of 2015.  Dr. Elenora Gamma note does mention the abdominal hernia in her note.  The pt told [name] that she has "lost 5 lbs in 6 weeks".   The pt also reported that she is exercising currently at 4000 steps per day with a goal of 9000 steps per day.  The pt told the research nurse that she has been in contact with study personnel.  The research nurse thanked the patient for her support of this study.  The pt was informed that her next appointment will be due in late April 2015.  The pt was made aware that her research labs and questionnaires will be done at her next visit.  The pt verbalized understanding.  The pt's CA-125 was within normal limits.  Scans were not ordered by the physician at this visit.

## 2013-09-13 NOTE — Progress Notes (Signed)
Consult Note: Gyn-Onc  Kristin Webb 68 y.o. female  CC:  Chief Complaint  Patient presents with  . Ovarian Cancer    HPI: Kristin Webb is a 68 year old, gravida 2 para 2, who noticed increasing abdominal swelling around the third week of January. Initially, the abdominal swelling was intermittent and would come and go. She related this to recent initiation of a calcium supplement. The abdominal swelling became more prevalent starting in February with abdominal pain reported also. In addition, she started having increasing constipation and formed stools, which was a change in her routine of 5-6 loose bowel movements a day chronically after her cholecystectomy. On November 01, 2012, she had a CT scan of the abdomen and pelvis which resulted no significant biliary dilation, no suspicious liver lesions, and the spleen, adrenal glands and pancreas appeared normal. 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 She has a large midabdominal mass measuring 17 x 22.7 cm x 20 cm cephalad, which was well circumscribed without calcifications. There is irregular thickened septations and areas of solid nodularity consistent with malignancy. There is no bowel obstruction and the mass appeared to be separate from the appendix. There was probable normal left ovarian tissue seen. The uterus is surgically absent. There is some soft tissue stranding at the base of the mesentery superior to the mass and some omental nodularity.    On November 08, 2012, she underwent an exploratory laparotomy, BSO, appendectomy, infracolic omentectomy, and optimal debulking. Operative findings included: 25 cm right adnexal mass with smooth surface. Surgically absent uterus. Atrophic-appearing left ovary. Normal appearing appendix. Within the omentum there were centimeter nodules scattered throughout the omentum. The remainder of the surfaces were benign. Final pathology  revealed:  1. 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 - BENIGN FALLOPIAN TUBE WITH NONSPECIFIC CHRONIC INFLAMMATION. 2. Ovary and fallopian tube, left - BENIGN OVARY; NEGATIVE FOR ATYPIA OR MALIGNANCY. - BENIGN FALLOPIAN TUBE; NEGATIVE FOR ATYPIA OR MALIGNANCY. 3. Omentum, resection for tumor - HIGH GRADE CARCINOMA, SEE COMMENT. 4. Appendix, Other than Incidental - FIBROUS OBLITERATION OF APPENDICEAL TIP. - NEGATIVE FOR MALIGNANCY.  She completed day 1, cycle 6 of 6 planned treatments of carboplatin and taxol on 03/28/13.   9/14 CT Findings: The liver is diffusely fatty infiltrated and measures 18.7 cm in cranial caudal length. No focal intrahepatic parenchymal abnormality. The spleen is unremarkable. The stomach, duodenum, pancreas and adrenal glands are unremarkable. The gallbladder is surgically absent. 11 x 9 x 11 mm nonobstructing stone is identified in the interpolar right kidney. Stable appearance of a 9 mm fatty lesion in the right kidney, likely a tiny angiomyolipoma. No abdominal aortic aneurysm. No free fluid in the abdomen. No abdominal lymphadenopathy. Haziness in the root of the small bowel mesentery is stable. Approximately 8 cm cranial to the umbilicus is an area of apparent focal midline fascial laxity. I cannot identify a discrete fascial defect to suggest an overt hernia. This is a wide-mouthed fascial bulge and some of the transverse colon projects out into this area of fascial bulging. No bowel wall thickening, adjacent edema, orfluid to suggest complication. Imaging through the pelvis shows no free intraperitoneal fluid. No pelvic sidewall lymphadenopathy. The uterus is surgically absent. The large complex cystic lesion seen in the right central abdomen and pelvis  has been resected in the interval. No substantial diverticular disease in  the colon. There is no colonic diverticulitis. Terminal ileum is normal. The appendix is not visualized, but there is no edema or inflammation in the region of the cecum. The right gonadal vein is enlarged and contains a central filling defect consistent with thrombus. Bone windows reveal no worrisome lytic or sclerotic osseous lesions.  IMPRESSION:  Interval resection of the large right pelvic and lower abdominal mass lesion with apparent omentectomy. No evidence for intraperitoneal free fluid on today's study. No discernible peritoneal lesions. 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  Interval History:  She was last seen by GYN oncology in September of 2014. At that time her exam was negative. She was last seen by Dr. Marko Plume in October 2014. Exam at that time was similarly unremarkable. Her CA 125 was 5.1. She comes in today for followup. CA 125 09/08/2011 was 3.1. Her last mammogram was in June 2013. Should a colonoscopy less than a year ago. She states that December and January were very difficult for her she started taking Lunesta 1 mg per day was waking up to 3 times at night and then feeling sleepy during the day. That this is been gradually increased to 2 mg. She also sleep apnea needs a new CPAP machine but her insurance will not cover it. Her weight did increase 299 pounds. With her sleeping better she's been able to join an exercise program she's lost about 5 pounds in 6 weeks. She was seen by her neurosurgeon in December. She now has for compressed disc and her abdominal girth is not helping you she's. She's had abdominal cancer different types but we'll increase her back pain or not helpful. She states that she feels a hernia is getting bigger. She continues to feel fatigued from her chemotherapy. She's currently exercising 4000 steps per day with a goal to 9000 steps per day.  Review of Systems:  Constitutional: Feels tired during the day. She is feeling better now she is sleeping  better at night with a Lunesta 1.5 mg as opposed to 1. She's lost 5 pounds in 6 weeks but that has been intentional.  Denies fever. Skin: No rash, sores, jaundice, itching, or dryness.  Cardiovascular: No chest pain, shortness of breath, or edema  Pulmonary: No cough or wheeze.  Gastro Intestinal: No nausea, vomiting, constipation, or diarrhea reported. No bright red blood per rectum or change in bowel movement.  Genitourinary: No frequency, urgency, or dysuria.  Denies vaginal bleeding and discharge.  Musculoskeletal: She has some back pain as well as leg pain. She's been seen by neurosurgery and has 4 compressed discs. Neurologic: No weakness, numbness, or change in gait.  Psychology: No changes   Current Meds:  Outpatient Encounter Prescriptions as of 09/13/2013  Medication Sig  . Armodafinil (NUVIGIL) 250 MG tablet Take 1 tablet (250 mg total) by mouth daily.  . Calcium Citrate-Vitamin D (CALCIUM CITRATE + D PO) Take 1 capsule by mouth daily.   . Cholecalciferol (VITAMIN D3) 5000 UNITS CAPS Take 5,000 mg by mouth daily.   . CRESTOR 10 MG tablet   . diphenhydrAMINE (BENADRYL) 25 mg capsule Take 50-75 mg by mouth at bedtime as needed for sleep.   Marland Kitchen EPINEPHrine (EPIPEN 2-PAK) 0.3 mg/0.3 mL DEVI Inject 0.3 mg into the muscle once. For bee stings  . eszopiclone (LUNESTA) 2 MG TABS tablet TAKE 1 TABLET BY MOUTH IMMEDIATELY BEFORE BEDTIME  . fish oil-omega-3 fatty acids 1000 MG  capsule Take 1 g by mouth 2 (two) times daily.  Marland Kitchen FLUoxetine (PROZAC) 10 MG capsule Take 30 mg by mouth every evening.  Marland Kitchen glucosamine-chondroitin 500-400 MG tablet Take 1 tablet by mouth 2 (two) times daily.  Marland Kitchen ibuprofen (ADVIL,MOTRIN) 200 MG tablet Take 200 mg by mouth every 6 (six) hours as needed for pain.  . Melatonin 10 MG TABS Take 10 mg by mouth at bedtime.  . Multiple Vitamin (MULTIVITAMIN WITH MINERALS) TABS Take 1 tablet by mouth daily.  Marland Kitchen OVER THE COUNTER MEDICATION Nystatin topical powder  100,000 units/ GM   Patient uses as needed  . triamterene-hydrochlorothiazide (MAXZIDE-25) 37.5-25 MG per tablet Take 1 tablet by mouth daily.    Allergy:  Allergies  Allergen Reactions  . Bee Venom Hives    Difficulty breathing, carries an EPI-pen  . Cortisone     Injected cortisone, "sick to my stomach, throwing up, hand swelling, and pain."    Social Hx:   History   Social History  . Marital Status: Married    Spouse Name: N/A    Number of Children: 2  . Years of Education: N/A   Occupational History  . Not on file.   Social History Main Topics  . Smoking status: Never Smoker   . Smokeless tobacco: Never Used  . Alcohol Use: No  . Drug Use: No  . Sexual Activity: Not on file   Other Topics Concern  . Not on file   Social History Narrative  . No narrative on file    Past Surgical Hx:  Past Surgical History  Procedure Laterality Date  . Cholecystectomy      early 92s  . Abdominal hysterectomy      early 1990s  . Trigger finger release    . Laparotomy Bilateral 11/08/2012    Procedure: EXPLORATORY LAPAROTOMY TOTAL ABDOMINAL HYSTERECTOMY BILATERAL SALPINGO OOPHORECTOMY TUMOR DEBULKING ;  Surgeon: Imagene Gurney A. Alycia Rossetti, MD;  Location: [facility];  Service: Gynecology;  Laterality: Bilateral;  APPENDECTOMY / OMENTECTOMY  . Appendectomy    . Exploratory laparotomy  11/08/12    BSO, appendectomy, omentectomy    Past Medical Hx:  Past Medical History  Diagnosis Date  . Pelvic mass in female   . Constipation   . Diarrhea     in the past after gallbladder removal  . Hyperlipidemia   . Burning mouth syndrome   . Chronic fatigue   . Fibromyalgia   . Lumbar disc disease   . PONV (postoperative nausea and vomiting)   . Hypertension     borderline not on meds   . Shortness of breath     with exertion   . Sleep apnea     CPAP settings at 13   . Pneumonia     hx of pneumonia   . GERD (gastroesophageal reflux disease)   . Yeast infection   . 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 IA ovarian and IIIB fallopian tube   12/13/2012 - 03/28/2013 Chemotherapy s/p 6 cycles of paclitaxel and carboplatin    Family Hx:  Family History  Problem Relation Age of Onset  . Lung cancer Mother 89  . Lung cancer Father 56  2 ppd smoker    Vitals:  Blood pressure 174/89, pulse 94, temperature 98.1 F (36.7 C), temperature source Oral, resp. rate 16, weight 194 lb 1.6 oz (88.043 kg).  Physical Exam: General: Well developed, well nourished female in no acute distress. Alert and oriented x 3.  Head/Neck: Sclerae anicteric with no excessive lacrimation. Oropharynx clear. Supple without any enlargements.  Lymph node survey: No cervical, supraclavicular, or inguinal adenopathy.  Cardiovascular: Regular rate and rhythm.  Lungs: Clear to auscultation bilaterally.  Skin: No rashes or lesions present.  Back: No CVA tenderness.  Abdomen: Abdomen soft, non-tender and obese. Active bowel sounds in all quadrants. No evidence of a fluid wave. Midline abdominal incision well healed. When patient sat up from a supine position, mild bulge noted in the upper abdomen appearing as an abdominal hernia. When patient Alexis abdominal wall it does appear to be an 8 cm abdominal wall hernia but easily reducible. Genitourinary:  Vulva/vagina: Normal external female genitalia. No lesions.  Urethra: No lesions or masses.  Vagina: No masses or nodularity Rectal: Good tone, no palpable masses or nodularity.  Extremities: No bilateral cyanosis, edema, or clubbing.     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 She was completely resected to an R0. She's completed her chemotherapy and had a post treatment CT scan that was negative. CA 125 last week was normal at 3.1.  Per protocol she will follow up with Dr. Marko Plume December 04 2013 with labs at that time. Return to see [place] in GYN oncology in August. I believe she has an abdominal hernia we discussed hernia repair. I discussed that would be ideal to have her at her weight loss) referral to general surgery she is in agreement. She would like to consider hernia repair surgery in approximately the fall of 2015. When she returns to see [place] be happy to make that referral.   Kristin Webb A., MD 09/13/2013, 11:42 AM

## 2013-09-13 NOTE — Patient Instructions (Signed)
Hernia A hernia occurs when an internal organ pushes out through a weak spot in the abdominal wall. Hernias most commonly occur in the groin and around the navel. Hernias often can be pushed back into place (reduced). Most hernias tend to get worse over time. Some abdominal hernias can get stuck in the opening (irreducible or incarcerated hernia) and cannot be reduced. An irreducible abdominal hernia which is tightly squeezed into the opening is at risk for impaired blood supply (strangulated hernia). A strangulated hernia is a medical emergency. Because of the risk for an irreducible or strangulated hernia, surgery may be recommended to repair a hernia. CAUSES   Heavy lifting.  Prolonged coughing.  Straining to have a bowel movement.  A cut (incision) made during an abdominal surgery. HOME CARE INSTRUCTIONS   Bed rest is not required. You may continue your normal activities.  Avoid lifting more than 10 pounds (4.5 kg) or straining.  Cough gently. If you are a smoker it is best to stop. Even the best hernia repair can break down with the continual strain of coughing. Even if you do not have your hernia repaired, a cough will continue to aggravate the problem.  Do not wear anything tight over your hernia. Do not try to keep it in with an outside bandage or truss. These can damage abdominal contents if they are trapped within the hernia sac.  Eat a normal diet.  Avoid constipation. Straining over long periods of time will increase hernia size and encourage breakdown of repairs. If you cannot do this with diet alone, stool softeners may be used. SEEK IMMEDIATE MEDICAL CARE IF:   You have a fever.  You develop increasing abdominal pain.  You feel nauseous or vomit.  Your hernia is stuck outside the abdomen, looks discolored, feels hard, or is tender.  You have any changes in your bowel habits or in the hernia that are unusual for you.  You have increased pain or swelling around the  hernia.  You cannot push the hernia back in place by applying gentle pressure while lying down. MAKE SURE YOU:   Understand these instructions.  Will watch your condition.  Will get help right away if you are not doing well or get worse. Document Released: 07/27/2005 Document Revised: 10/19/2011 Document Reviewed: 03/15/2008 [employer] Patient Information 2014 [employer].  

## 2013-09-14 ENCOUNTER — Other Ambulatory Visit: Payer: Self-pay | Admitting: *Deleted

## 2013-09-14 DIAGNOSIS — C569 Malignant neoplasm of unspecified ovary: Secondary | ICD-10-CM

## 2013-09-15 ENCOUNTER — Other Ambulatory Visit: Payer: Self-pay | Admitting: *Deleted

## 2013-09-15 ENCOUNTER — Encounter: Payer: Self-pay | Admitting: *Deleted

## 2013-09-15 ENCOUNTER — Telehealth: Payer: Self-pay | Admitting: Oncology

## 2013-09-15 DIAGNOSIS — C569 Malignant neoplasm of unspecified ovary: Secondary | ICD-10-CM

## 2013-09-15 NOTE — Telephone Encounter (Signed)
, °

## 2013-09-15 NOTE — Progress Notes (Signed)
09/15/13 at 11:47am - The research nurse read Dr. Elenora Gamma office note from 09/13/13.  The study has an adverse event follow up period CRF for the month 3 visit.  The research nurse called the patient and asked if she had any new adverse events since beginning this study.  The pt denies being hospitalized during this reporting period. The pt denied any new adverse events.  She said that the insomnia (grade 2) that [name] mentions in her note is an "ongoing, chronic problem" for her.  The pt said that she has had problems sleeping for about "6 years now".  The pt states she has a history of sleep apnea (diagnosed 6 years ago) which affects the quality of her sleep.  She states she has recently started on Lunesta with some relief.  She said that the mild back and leg pain (grade 1) mentioned in Dr. Elenora Gamma note is "directly related to her compressed discs" which the pt states has been a problem before her study entry.  She states the pain is no worse since she began her study participation.  She also states her hernia has been present soon after her surgery.  She states it is getting "bigger", and she will have hernia repair in the fall.  She said the hernia is related to her surgery.  She states her moderate fatigue (grade 2)  is related to her "lack of consistent sleep".  The pt states that she has not experienced any negative problems related to her study participation.  The pt is aware of her month 6 appointments.    The pt was unable to given any specific start dates for her pre-existing problems (insomnia, fatigue, back pain, leg pain).  Therefore, the research nurse used the date of the doctor's note as the start dates of the AE's.

## 2013-09-19 ENCOUNTER — Telehealth: Payer: Self-pay | Admitting: Genetic Counselor

## 2013-09-19 ENCOUNTER — Encounter: Payer: Self-pay | Admitting: Genetic Counselor

## 2013-09-19 NOTE — Telephone Encounter (Signed)
Left message with husband to have her call back 

## 2013-09-19 NOTE — Telephone Encounter (Signed)
Revealed negative testing on BRCA1/2 [employer] mutation panel.  The full Breast/ovarian cancer panel is pending.

## 2013-09-22 ENCOUNTER — Telehealth: Payer: Self-pay | Admitting: Neurology

## 2013-09-22 DIAGNOSIS — G4701 Insomnia due to medical condition: Secondary | ICD-10-CM

## 2013-09-22 NOTE — Telephone Encounter (Signed)
Patient need pre authorization for Eszopiclone--now has [employer]--(I will add new insurance into computer)

## 2013-09-22 NOTE — Telephone Encounter (Signed)
I have already submitted all info to the ins requesting a prior auth for Lunesta 2mg  one daily and Nuvigil 250mg  one daily (please see previous note).  Pending ins response.

## 2013-09-22 NOTE — Telephone Encounter (Signed)
I have submitted all required info to [facility] to request a Prior Auth on both meds.  Pending their response.

## 2013-09-22 NOTE — Telephone Encounter (Signed)
Pt called back in and stated that she will need prior approval of her Nuvigil as well.  Please call if there are any questions.

## 2013-09-22 NOTE — Telephone Encounter (Signed)
Has questions about medication Eszopiclone--please call-thank you.

## 2013-09-22 NOTE — Telephone Encounter (Signed)
Called patient and patient states that she is having trouble getting her medication because of needing prior authorization. The medications are eszopiclone and nuvigil. The patient also states that Dr. Brett Fairy prescribe her eszopiclone (Lunesta) 2 mg and wanted the patient to take 1 mg by cutting it in half. Patient states that it did not help so she started taking 2 mg daily. Patient wanted to let doctor Dr. Brett Fairy, to make sure that it was all right. Patient states that the 2 mg work better for her. Please advise.

## 2013-09-26 MED ORDER — ESZOPICLONE 2 MG PO TABS: 2.0000 mg | ORAL_TABLET | Freq: Every evening | ORAL | Status: DC | PRN

## 2013-09-26 NOTE — Telephone Encounter (Signed)
Ok to return to 2 mg - refilled accordingly.

## 2013-10-02 ENCOUNTER — Encounter: Payer: Self-pay | Admitting: Genetic Counselor

## 2013-10-02 ENCOUNTER — Telehealth: Payer: Self-pay | Admitting: Genetic Counselor

## 2013-10-02 NOTE — Telephone Encounter (Signed)
Revealed negative breast/ovarian cancer syndrome genetic testing.

## 2013-10-04 ENCOUNTER — Other Ambulatory Visit: Payer: Self-pay | Admitting: Oncology

## 2013-11-03 ENCOUNTER — Telehealth: Payer: Self-pay | Admitting: *Deleted

## 2013-11-03 NOTE — Telephone Encounter (Signed)
Patient called and moved her appt to later in the day. At the time of moving the appt another appt got canceled from [facility]. I have called that office and they will fix

## 2013-11-05 ENCOUNTER — Other Ambulatory Visit: Payer: Self-pay | Admitting: Oncology

## 2013-11-06 ENCOUNTER — Other Ambulatory Visit (HOSPITAL_BASED_OUTPATIENT_CLINIC_OR_DEPARTMENT_OTHER): Payer: Commercial Managed Care - HMO

## 2013-11-06 ENCOUNTER — Other Ambulatory Visit: Payer: Medicare Other

## 2013-11-06 ENCOUNTER — Ambulatory Visit (HOSPITAL_BASED_OUTPATIENT_CLINIC_OR_DEPARTMENT_OTHER): Payer: Commercial Managed Care - HMO

## 2013-11-06 VITALS — BP 134/104 | HR 77 | Temp 97.8°F

## 2013-11-06 DIAGNOSIS — Z452 Encounter for adjustment and management of vascular access device: Secondary | ICD-10-CM

## 2013-11-06 DIAGNOSIS — C5700 Malignant neoplasm of unspecified fallopian tube: Secondary | ICD-10-CM

## 2013-11-06 DIAGNOSIS — C5701 Malignant neoplasm of right fallopian tube: Secondary | ICD-10-CM

## 2013-11-06 DIAGNOSIS — Z95828 Presence of other vascular implants and grafts: Secondary | ICD-10-CM

## 2013-11-06 LAB — COMPREHENSIVE METABOLIC PANEL (CC13)
ALT: 26 U/L (ref 0–55)
AST: 20 U/L (ref 5–34)
Albumin: 4.2 g/dL (ref 3.5–5.0)
Alkaline Phosphatase: 75 U/L (ref 40–150)
Anion Gap: 14 mEq/L — ABNORMAL HIGH (ref 3–11)
BUN: 11.6 mg/dL (ref 7.0–26.0)
CO2: 25 mEq/L (ref 22–29)
Calcium: 10 mg/dL (ref 8.4–10.4)
Chloride: 104 mEq/L (ref 98–109)
Creatinine: 0.7 mg/dL (ref 0.6–1.1)
Glucose: 76 mg/dl (ref 70–140)
Potassium: 3.4 mEq/L — ABNORMAL LOW (ref 3.5–5.1)
Sodium: 143 mEq/L (ref 136–145)
Total Bilirubin: 0.32 mg/dL (ref 0.20–1.20)
Total Protein: 7.3 g/dL (ref 6.4–8.3)

## 2013-11-06 LAB — CBC WITH DIFFERENTIAL/PLATELET
BASO%: 0.4 % (ref 0.0–2.0)
Basophils Absolute: 0 10*3/uL (ref 0.0–0.1)
EOS%: 1.3 % (ref 0.0–7.0)
Eosinophils Absolute: 0.1 10*3/uL (ref 0.0–0.5)
HCT: 43.6 % (ref 34.8–46.6)
HGB: 14.2 g/dL (ref 11.6–15.9)
LYMPH%: 34.4 % (ref 14.0–49.7)
MCH: 28.9 pg (ref 25.1–34.0)
MCHC: 32.6 g/dL (ref 31.5–36.0)
MCV: 88.7 fL (ref 79.5–101.0)
MONO#: 0.6 10*3/uL (ref 0.1–0.9)
MONO%: 9.3 % (ref 0.0–14.0)
NEUT#: 3.5 10*3/uL (ref 1.5–6.5)
NEUT%: 54.6 % (ref 38.4–76.8)
Platelets: 158 10*3/uL (ref 145–400)
RBC: 4.92 10*6/uL (ref 3.70–5.45)
RDW: 13.6 % (ref 11.2–14.5)
WBC: 6.4 10*3/uL (ref 3.9–10.3)
lymph#: 2.2 10*3/uL (ref 0.9–3.3)

## 2013-11-06 LAB — CA 125: CA 125: 3.8 U/mL (ref 0.0–30.2)

## 2013-11-06 MED ORDER — HEPARIN SOD (PORK) LOCK FLUSH 100 UNIT/ML IV SOLN
500.0000 [IU] | Freq: Once | INTRAVENOUS | Status: AC
  Administered 2013-11-06: 500 [IU] via INTRAVENOUS
  Filled 2013-11-06: qty 5

## 2013-11-06 MED ORDER — SODIUM CHLORIDE 0.9 % IJ SOLN
10.0000 mL | INTRAMUSCULAR | Status: DC | PRN
  Administered 2013-11-06: 10 mL via INTRAVENOUS
  Filled 2013-11-06: qty 10

## 2013-11-13 ENCOUNTER — Other Ambulatory Visit: Payer: Self-pay | Admitting: Oncology

## 2013-11-22 ENCOUNTER — Encounter: Payer: Self-pay | Admitting: *Deleted

## 2013-11-24 ENCOUNTER — Encounter: Payer: Self-pay | Admitting: Neurology

## 2013-11-24 ENCOUNTER — Ambulatory Visit (INDEPENDENT_AMBULATORY_CARE_PROVIDER_SITE_OTHER): Payer: Commercial Managed Care - HMO | Admitting: Neurology

## 2013-11-24 VITALS — BP 139/94 | HR 110 | Resp 17 | Ht 59.75 in | Wt 192.0 lb

## 2013-11-24 DIAGNOSIS — K146 Glossodynia: Secondary | ICD-10-CM | POA: Insufficient documentation

## 2013-11-24 DIAGNOSIS — G4701 Insomnia due to medical condition: Secondary | ICD-10-CM

## 2013-11-24 DIAGNOSIS — G4730 Sleep apnea, unspecified: Secondary | ICD-10-CM

## 2013-11-24 DIAGNOSIS — G4733 Obstructive sleep apnea (adult) (pediatric): Secondary | ICD-10-CM

## 2013-11-24 DIAGNOSIS — C569 Malignant neoplasm of unspecified ovary: Secondary | ICD-10-CM

## 2013-11-24 DIAGNOSIS — G4710 Hypersomnia, unspecified: Secondary | ICD-10-CM

## 2013-11-24 MED ORDER — ARMODAFINIL 250 MG PO TABS: 250.0000 mg | ORAL_TABLET | Freq: Every day | ORAL | Status: DC

## 2013-11-24 MED ORDER — ESZOPICLONE 2 MG PO TABS: 2.0000 mg | ORAL_TABLET | Freq: Every evening | ORAL | Status: DC | PRN

## 2013-11-24 NOTE — Patient Instructions (Signed)
Paraneoplastic Syndromes Paraneoplastic syndromes are a group of rare disorders triggered by a person's immune system in response to a cancerous tumor.  CAUSES Neurologic paraneoplastic syndromes are believed to occur when cancer-fighting antibodies (white blood cells known as T cells) attack normal cells by mistake in the nervous system. These disorders often affect middle-aged to elderly people. They are most common in people with lung, ovarian, lymphatic, or breast cancer. SYMPTOMS  Symptoms generally develop over a period of days to weeks. They usually occur prior to tumor detection, which can complicate the diagnosis. Symptoms include:  Difficulty with walking.  Difficulty with swallowing.  Loss of muscle tone.  Sleep disturbances.  Dementia.  Dizziness (vertigo).  Loss of fine motor coordination.  Slurred speech.  Memory loss.  Vision problems.  Seizures.  Loss of feeling in the limbs. DIAGNOSIS Diagnostic tests will depend on your symptoms and the particular syndrome your caregiver thinks you may have. Testing may include blood tests, procedures such as lumbar puncture, and radiology tests such as magnetic resonance imaging (MRI). TREATMENT  The cancer is treated first. Then, efforts are made to decrease the autoimmune response. This may be done with:  Steroids (cortisone or prednisone).  High-dose immunoglobulin.  Irradiation. Blood cleansing (plasmapheresis) may ease symptoms in patients with paraneoplastic disorders. These disorders affect the peripheral nervous system. Speech and physical therapy may help patients regain some functions. There are no cures for these syndromes. HOME CARE INSTRUCTIONS Follow the instructions provided by your caregiver. SEEK IMMEDIATE MEDICAL CARE IF: You have any of the symptoms your caregiver has told you are an emergency. These symptoms will be specific to your cancer and paraneoplastic syndrome. Document Released: 07/17/2002  Document Revised: 10/19/2011 Document Reviewed: 12/15/2010 [facility] Patient Information 2014 [place].

## 2013-11-24 NOTE — Progress Notes (Signed)
[facility]  Provider:  Larey Seat, M D  Referring Provider: Cari Caraway, MD Primary Care Physician:  Cari Caraway, MD  Chief Complaint  Patient presents with  . Follow-up    Room 11  . Sleep consult    HPI:  Kristin Webb is a 68 y.o. female , with OSA and persistent  fatigue, is seen here as a  revisit  from Dr. Addison Lank for CPAP follow up/ compliance visit.   Since I have seen her last this patient was diagnosed with ovarian cancer, underwent surgery and chemotherapy. She currently struggles with a weak  abdominal and core musculatur  and it feels to her she had an abdominal hernia. She has tried a corsett , but this has aggravated her bulging discs in her spine.   The patient was originally referred by Dr. Sheryn Bison at the time with fatigue , pain, excessive sleepiness and burning mouth syndrome. In 2010 she developed transiently left mouth droop, and a TIA/ CVA work up was negative. EMG and nerve conduction studies were unrevealing , a rheumatology consult,  pain specialist , neurosurgical evaluation were unrevealing .  Labs revealed a vitamin D deficiency,  Normal TSH and CMET, CBC and neurosurgery consult  For back pain was without any results.  She was referred to me for a sleep consultation in 2009. The patient was diagnosed with sleep apnea on 03-07-08 with an AHI of 24.3 and in REM AHI of 82. Her BMI at the time was 37.9 , she was titrated to 10 cm water pressure on CPAP  which relieved her AHI but she still snored.  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 She has compliantly  used a  Original machine,  New the CPAP  machine seems to have failed. I am unable to obtain any data. In addition the patient had remained excessively fatigued and daytime sleepy. Possibly , this could have been a paraneoplastic manifestation of ovarian malignancy.  She has been using Nuvigil  as tolerated which has given her improvement  in life quality and energy. The last CPAP  download was on 05-06-12 with the 95% percentile pressure of 12 cm water, the residual AHI of 0.5 and an average of 5.9 hours of night-user time for CPAP.   She uses Lunesta for insomnia, doubled the dose at the time of her tumour diagnosis.  She has endorsed the Epworth score at 12 and FSS at 43 points, the GDS at 1 points.  She has almost met her doughnut hole.    Review of Systems: Out of a complete 14 system review, the patient complains of only the following symptoms, and all other reviewed systems are negative. Fatigue , EDS, abdominal distention, dry mouth.   History   Social History  . Marital Status: Married    Spouse Name: [name]    Number of Children: 2  . Years of Education: Master's   Occupational History  . Not on file.   Social History Main Topics  . Smoking status: Never Smoker   . Smokeless tobacco: Never Used  . Alcohol Use: No  . Drug Use: No  . Sexual Activity: Not on file   Other Topics Concern  . Not on file   Social History Narrative   Patient is married [name]) and lives at home with her husband.   Patient has 2 children by birth and 13 children all together.   Patient has a Oceanographer.   Patient is right-handed.  Patient drinks very little caffeine.             Family History  Problem Relation Age of Onset  . Lung cancer Mother 27  . Lung cancer Father 81    2 ppd smoker  . High blood pressure Mother   . High Cholesterol Mother   . Parkinson's disease Father   . Kidney disease      Past Medical History  Diagnosis Date  . Pelvic mass in female   . Constipation   . Diarrhea     in the past after gallbladder removal  . Hyperlipidemia   . Burning mouth syndrome   . Chronic fatigue   . Fibromyalgia   . Lumbar disc disease   . PONV (postoperative nausea and vomiting)   . Hypertension     borderline not on meds   . Shortness of breath     with exertion   . Sleep apnea     CPAP  settings at 12   . Pneumonia     hx of pneumonia   . GERD (gastroesophageal reflux disease)   . Yeast infection   . Ovarian cancer 2014    Past Surgical History  Procedure Laterality Date  . Cholecystectomy      early 60s  . Abdominal hysterectomy      early 1990s  . Trigger finger release    . Laparotomy Bilateral 11/08/2012    Procedure: EXPLORATORY LAPAROTOMY TOTAL ABDOMINAL HYSTERECTOMY BILATERAL SALPINGO OOPHORECTOMY TUMOR DEBULKING ;  Surgeon: Imagene Gurney A. Alycia Rossetti, MD;  Location: [facility];  Service: Gynecology;  Laterality: Bilateral;  APPENDECTOMY / OMENTECTOMY  . Appendectomy    . Exploratory laparotomy  11/08/12    BSO, appendectomy, omentectomy    Current Outpatient Prescriptions  Medication Sig Dispense Refill  . Armodafinil (NUVIGIL) 250 MG tablet Take 1 tablet (250 mg total) by mouth daily.  30 tablet  5  . Calcium Citrate-Vitamin D (CALCIUM CITRATE + D PO) Take 1 capsule by mouth daily.       . Cholecalciferol (VITAMIN D3) 5000 UNITS CAPS Take 5,000 mg by mouth daily.       . CRESTOR 10 MG tablet       . diphenhydrAMINE (BENADRYL) 25 mg capsule Take 50-75 mg by mouth at bedtime as needed for sleep.       Marland Kitchen EPINEPHrine (EPIPEN 2-PAK) 0.3 mg/0.3 mL DEVI Inject 0.3 mg into the muscle once. For bee stings      . eszopiclone (LUNESTA) 2 MG TABS tablet Take 1 tablet (2 mg total) by mouth at bedtime as needed for sleep. Take immediately before bedtime  30 tablet  2  . fish oil-omega-3 fatty acids 1000 MG capsule Take 1 g by mouth 2 (two) times daily.      Marland Kitchen FLUoxetine (PROZAC) 10 MG capsule Take 30 mg by mouth every evening.      Marland Kitchen glucosamine-chondroitin 500-400 MG tablet Take 1 tablet by mouth 2 (two) times daily.      Marland Kitchen ibuprofen (ADVIL,MOTRIN) 200 MG tablet Take 200 mg by mouth every 6 (six) hours as needed for pain.      . Melatonin 10 MG TABS Take 10 mg by mouth at bedtime.      . Multiple Vitamin (MULTIVITAMIN WITH MINERALS) TABS Take 1 tablet by mouth daily.      Marland Kitchen OVER  THE COUNTER MEDICATION Nystatin topical powder  100,000 units/ GM  Patient uses as needed      . triamterene-hydrochlorothiazide (MAXZIDE-25)  37.5-25 MG per tablet Take 1 tablet by mouth daily.  30 tablet  1   No current facility-administered medications for this visit.    Allergies as of 11/24/2013 - Review Complete 11/24/2013  Allergen Reaction Noted  . Bee venom Hives 11/02/2012  . Cortisone  11/02/2012    Vitals: BP 139/94  Pulse 110  Resp 17  Ht 4' 11.75" (1.518 m)  Wt 192 lb (87.091 kg)  BMI 37.79 kg/m2 Last Weight:  Wt Readings from Last 1 Encounters:  11/24/13 192 lb (87.091 kg)   Last Height:   Ht Readings from Last 1 Encounters:  11/24/13 4' 11.75" (1.518 m)    Physical exam:  General: The patient is awake, alert and appears not in acute distress. The patient is well groomed. Head: Normocephalic, atraumatic. Neck is supple. Mallampati 3 , neck circumference:  Cardiovascular:  Regular rate and rhythm , without  murmurs or carotid bruit, and without distended neck veins. Respiratory: Lungs are clear to auscultation. Skin:  Without evidence of edema, or rash Trunk: BMI remains  Elevated, this  patient has normal posture.  Neurologic exam : The patient is awake and alert, oriented to place and time.  Memory subjective  described as intact. There is a normal attention span & concentration ability. Speech is fluent without  dysarthria, dysphonia or aphasia. Mood and affect are appropriate.  Cranial nerves: Pupils are equal and briskly reactive to light. Funduscopic exam without evidence of pallor or edema.  Extraocular movements  in vertical and horizontal planes intact and without nystagmus. Visual fields by finger perimetry are intact. Hearing to finger rub intact.  Facial sensation intact to fine touch. Facial motor strength is symmetric and tongue and uvula move midline.  Motor exam:  Normal tone , l muscle bulk and symmetric strength in all extremities. Grip  strength is improved.   Sensory:  Fine touch, pinprick and vibration were tested in all extremities. Proprioception is  normal.  Coordination: Rapid alternating movements in the fingers/hands is tested and normal. Finger-to-nose maneuver with a mild action  tremor.  Gait and station: Patient walks without assistive device , Strength within normal limits. Stance is stable and normal.  Steps are unfragmented.  Deep tendon reflexes: in the  upper and lower extremities are symmetric and intact.    Assessment:  After physical and neurologic examination, review of laboratory studies, imaging, neurophysiology testing and pre-existing records, assessment is   1) OSA : Not likely improved, given weaker core and respiratory muscles, and BMI. Patient will need a retitration and new  CPAP machine.  A mask  will need to be refitted as her burning mouth syndrome doesn't allow for nasal mask- she uses a small size quattro FX FFM.       Plan: split for   retitration study. CPAP need to be documented , medicare guidelines.

## 2013-11-26 ENCOUNTER — Ambulatory Visit (INDEPENDENT_AMBULATORY_CARE_PROVIDER_SITE_OTHER): Payer: Commercial Managed Care - HMO | Admitting: Neurology

## 2013-11-26 VITALS — BP 121/86

## 2013-11-26 DIAGNOSIS — G4733 Obstructive sleep apnea (adult) (pediatric): Secondary | ICD-10-CM

## 2013-11-26 DIAGNOSIS — G4730 Sleep apnea, unspecified: Secondary | ICD-10-CM

## 2013-11-26 DIAGNOSIS — G4700 Insomnia, unspecified: Secondary | ICD-10-CM

## 2013-11-28 NOTE — Sleep Study (Signed)
See media tab for full report  

## 2013-11-30 ENCOUNTER — Other Ambulatory Visit: Payer: Self-pay | Admitting: *Deleted

## 2013-12-01 ENCOUNTER — Telehealth: Payer: Self-pay | Admitting: Neurology

## 2013-12-01 NOTE — Telephone Encounter (Signed)
Study not yet read. CD

## 2013-12-01 NOTE — Telephone Encounter (Signed)
Pt called wants to know when Dr. Brett Fairy is going to get with her concerning the CPAP whether she needs to continue or discontinue the use of it. Please call pt concerning this matter and pt states if she isn't there you may leave a message. Thanks

## 2013-12-01 NOTE — Telephone Encounter (Signed)
Patient called stating she needs to know if Dr. Brett Fairy wants her to continue using CPAP or discontinue. Patient stated she had her sleep study on Sunday ( 11-26-2013) and was under the impression that she would know something on Monday (11-27-13).

## 2013-12-03 ENCOUNTER — Other Ambulatory Visit: Payer: Self-pay | Admitting: Oncology

## 2013-12-04 NOTE — Telephone Encounter (Signed)
I called and spoke with the patient about her sleep study. I informed the patient that her study wasn't ready as of yet for Dr. Brett Fairy to read.Once [facility] finish scoring the study and Dr. Brett Fairy reads it then Dr. Brett Fairy will call her personally to give the results on Tuesday (12-05-13).

## 2013-12-04 NOTE — Telephone Encounter (Signed)
Study not in my read stack- please find the paperwork.

## 2013-12-05 ENCOUNTER — Encounter: Payer: Self-pay | Admitting: *Deleted

## 2013-12-05 ENCOUNTER — Telehealth: Payer: Self-pay | Admitting: Neurology

## 2013-12-05 NOTE — Telephone Encounter (Signed)
I tried calling the patient about her recent sleep study results, but was unable to leave a message because patient phone just kept ringing. I will fax a copy of the report to Dr. Abigail Butts McNeill's office and mail a copy of the report to the patient.

## 2013-12-06 ENCOUNTER — Encounter: Payer: Self-pay | Admitting: Oncology

## 2013-12-06 ENCOUNTER — Ambulatory Visit (HOSPITAL_BASED_OUTPATIENT_CLINIC_OR_DEPARTMENT_OTHER): Payer: Commercial Managed Care - HMO | Admitting: Oncology

## 2013-12-06 ENCOUNTER — Other Ambulatory Visit (HOSPITAL_BASED_OUTPATIENT_CLINIC_OR_DEPARTMENT_OTHER): Payer: Commercial Managed Care - HMO

## 2013-12-06 ENCOUNTER — Encounter: Payer: Self-pay | Admitting: *Deleted

## 2013-12-06 ENCOUNTER — Ambulatory Visit (HOSPITAL_BASED_OUTPATIENT_CLINIC_OR_DEPARTMENT_OTHER): Payer: Commercial Managed Care - HMO

## 2013-12-06 ENCOUNTER — Telehealth: Payer: Self-pay | Admitting: Oncology

## 2013-12-06 VITALS — BP 137/85 | HR 84 | Temp 98.0°F | Resp 20 | Ht 59.5 in | Wt 194.5 lb

## 2013-12-06 DIAGNOSIS — Z95828 Presence of other vascular implants and grafts: Secondary | ICD-10-CM

## 2013-12-06 DIAGNOSIS — C5700 Malignant neoplasm of unspecified fallopian tube: Secondary | ICD-10-CM

## 2013-12-06 DIAGNOSIS — Z452 Encounter for adjustment and management of vascular access device: Secondary | ICD-10-CM

## 2013-12-06 DIAGNOSIS — Z8544 Personal history of malignant neoplasm of other female genital organs: Secondary | ICD-10-CM

## 2013-12-06 DIAGNOSIS — C569 Malignant neoplasm of unspecified ovary: Secondary | ICD-10-CM

## 2013-12-06 LAB — RESEARCH LABS

## 2013-12-06 MED ORDER — HEPARIN SOD (PORK) LOCK FLUSH 100 UNIT/ML IV SOLN
500.0000 [IU] | Freq: Once | INTRAVENOUS | Status: AC
Start: 2013-12-06 — End: 2013-12-06
  Administered 2013-12-06: 500 [IU] via INTRAVENOUS
  Filled 2013-12-06: qty 5

## 2013-12-06 MED ORDER — SODIUM CHLORIDE 0.9 % IJ SOLN
10.0000 mL | INTRAMUSCULAR | Status: DC | PRN
  Administered 2013-12-06: 10 mL via INTRAVENOUS
  Filled 2013-12-06: qty 10

## 2013-12-06 NOTE — Progress Notes (Signed)
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 She is now on observation, and is additionally participating in GOG 225 study of diet and exercise, month 6 now. She saw Dr Alycia Rossetti in February and will need to see gyn onc again week of March 05, 2014 for month 9 of the GOG 225 study. Labs were done 11-06-13. Last imaging was CT AP 04-2013. On the research study, she has now increased walking to total 5000 steps/day. This does not include water aerobics which she is now doing 3x weekly at church facility in [place]. She is still uncomfortable from large upper abdominal fascial laxity vs ventral hernia, unchanged. This aggravates her chronic back problems. She has not found any support garments that fit comfortably.  She also has chronic fatigue for which she takes Nuvigil (not covered well by her insurance) and is on CPAP (again problems with coverage thru her insurance), both of which are followed by Dr Brett Fairy.  She has good appetite, no new or different abdominal or pelvic symptoms, bowels unchanged, no bleeding, no LE swelling.   She has PAC, flushed today.  Performance status ECOG 1   ONCOLOGIC HISTORY Oncology History   IIIB serous carcinoma of right fallopian tube, treated with optimal debulking 11-08-2012 and adjuvant chemotherapy     Ovarian ca   11/17/2012 Initial Diagnosis Ovarian ca    Fallopian tube carcinoma   11/01/2012 Initial Diagnosis Fallopian tube carcinoma   11/08/2012 Surgery Debulking, optima. IA ovarian and IIIB fallopian tube   12/13/2012 - 03/28/2013 Chemotherapy s/p 6 cycles of paclitaxel and carboplatin   Patient presented in ~ Jan 2014 with complaints of abdominal distension and change in bowels. She had had unremarkable colonoscopy July 2013 (not in this EMR, and patient does not recall which MD). She is post hysterectomy in 1990s. Abdominal symptoms did not improve with interventions for constipation, and CT AP was done in [facility] system 11-01-12 with finding of large mid abdominal mass. She was referred to Dr Alycia Rossetti. Preop CA 125 is not available in this EMR; preop CXR had some linear scarring in lingula without acute findings. She went to exploratory laparotomy, bilateral salpingo-oophorectomy, appendectomy, and infacolic omentectomy, which was optimal debulking, by Dr Alycia Rossetti on 11-08-2012. Operative findings: 25 cm right adnexal mass with smooth surface. Surgically absent uterus. Atrophic-appearing left ovary. Within the omentum there were centimeter nodules scattered throughout. The remainder of the surfaces were benign. 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 She had PAC placed by IR  and 6 cycles of  taxol carbo from 12-13-12 thru 03-28-13, with neulasta support. She had negative BRCA and breast/ovarian cancer syndrome genetic screening in spring 2015.   Review of systems as above, also: No fever or symptoms of infection. No problems with PAC.Bladder ok. Remainder of 10 point Review of Systems negative.  Objective:  Vital signs in last 24 hours:  BP 137/85  Pulse 84  Temp(Src) 98 F (36.7 C)  Resp 20  Ht 4' 11.5" (1.511 m)  Wt 194 lb 8 oz (88.225 kg)  BMI 38.64 kg/m2  SpO2 100%  Alert, oriented  and appropriate. Ambulatory without assistance.  No alopecia  HEENT:PERRL, sclerae not icteric. Oral mucosa moist without lesions, posterior pharynx clear.  Neck supple. No JVD.  Lymphatics:no cervical,suraclavicular, axillary or inguinal adenopathy Resp: clear to auscultation bilaterally and normal  percussion bilaterally Cardio: regular rate and rhythm. No gallop. GI: abdomen obese, soft, nontender, no appreciable mass or organomegaly. Large bulging of midline upper abdomen, not tender. Normally active bowel sounds. Surgical incision not remarkable otherwise. Musculoskeletal/ Extremities: without pitting edema, cords, tenderness Neuro: Speech fluent and appropriate. CN intact. Moves all extremities equally. Psych appropriate mood and affect Skin without rash, ecchymosis, petechiae Portacath-without erythema or tenderness   Waist measurement done for study by research RN.  Lab Results:  Results for orders placed in visit on 12/06/13  [place].      Labs from 11-06-13 reviewed, with blood counts all in normal range, CA125 3.8 (has not been elevated) and CMET normal with exception of K 3.4  Studies/Results:  No results found.  Medications: I have reviewed the patient's current medications.    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 Continuing GOG 225 diet and exercise study. Per study needs gyn onc visit ~ July 27. Will try to coordinate as possible with PAC flushes, tho study requirements do not always allow this. 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 Patient aware that weight loss would be beneficial prior to surgical repair. 4.PAC in: needs flush every 6-8 weeks.  5. lumbar disc disease symptomatic with LLE pain: Dr Ashok Pall aware  6.elevated BP: continuing Maxzide. PCP will manage after patient is back at that office for PE.  7.obesity:  BMI 38.7. GOG 225 diet and exercise study.  8.sleep apnea, narcolepsy- type disorder, fibromyalgia. On Nuvigil and CPAP per Dr Brett Fairy.  9.negative BRCA and breast/ovarian cancer  syndrome testing spring 2015. 10. On supplemental Vitamin D. May need Vit D level from [facility] if not done by Dr Addison Lank upcoming.  Patient has had all questions answered and is in agreement with plan above. Appreciate assistance from Electrical engineer. Time spent 25 min including >50% counseling and coordination of care, including research study.   Gordy Levan, MD   12/06/2013, 10:05 AM

## 2013-12-06 NOTE — Telephone Encounter (Signed)
[employer] at 12/05/2013 4:04 PM     Status: Signed        I tried calling the patient about her recent sleep study results, but was unable to leave a message because patient phone just kept ringing. I will fax a copy of the report to Dr. Abigail Butts McNeill's office and mail a copy of the report to the patient.   (copy pasted from telephone encounter from 12/05/13, I am closing this telephone encounter. -sh

## 2013-12-06 NOTE — Patient Instructions (Signed)

## 2013-12-06 NOTE — Telephone Encounter (Signed)
Pt has called twice regarding her recent sleep study that revealed no sleep apnea. I see that Ivin Booty called her today to discuss the results with her. She is having difficulty understanding how it is possible that her study a few years ago showed significant osa and this one revealed none at all. She also wants to know if Dr. Brett Fairy is asking her to stop using CPAP now altogether because it is not indicated in the final report.  Tried to call patient back and was not able to leave a message, phone just rang and rang.  I will continue trying to reach this patient.  When Dr. Brett Fairy and I discussed this case, Dr. Brett Fairy had no explanation for this very unusual resolution of sleep apnea.  She is welcome to continue using her CPAP machine and we can help her with supplies if needed.  However, with the current test results, she will not be able to get a new CPAP machine or supplies covered by her insurance because her test did not show that she qualified for this equipment (her AHI was less than 5 per hour).  If she feels better sleeping with it, she is welcome to continue using it.  Her test did show significant snoring and upper airway resistance.  Her sleep was also somewhat fragmented.

## 2013-12-06 NOTE — Telephone Encounter (Signed)
per pof to sch pt appts/pt was sch w/Dr Gehrig-printed & gave pt copy of the sch

## 2013-12-06 NOTE — Progress Notes (Signed)
12/06/13 at 9:46am - GOG 225, month 6 on-study visit - The pt was into the clinic this am for her month 6 on-study visit with Dr. Marko Plume.  The pt was given her questionnaires upon arrival to the cancer center and instructed to complete them before any study procedures.  The pt was unable to be reached yesterday by phone or email to alert her to fast before her scheduled lab appointment.  The pt said that she was at jury duty, and she was not aware that her answering machine was not working.  The pt said that she was not fasting this morning.  The pt's research blood was drawn as scheduled.  The pt's height, weight (shoeless), and waste measurement were obtained according to section 4.61 of the protocol.  The research nurse measured the pt's waist measurement taking close attention to ensure the tape was straight.  The waist measurement was 45.5 inches (115.5 cm).  The pt was seen and examined by Dr. Marko Plume.  The pt's questionnaires were reviewed by the research nurse for accuracy and completeness. The pt reported that she is satisfied with her participation in the study.  She said that her next coaching call is due on 12/08/13.    12/06/13 at 10:50am - The lab called and stated the pt's serum would not clot.  Therefore, the pt was asked if she would allow another serum sample to be obtained for the study.  The pt agreed to allow a second serum to be obtained for the study.  The pt was thanked for her support of the trial.  The research assistant contacted the GOG tissue bank, and they encouraged the site to send the collected serum for banking purposes.  The second serum sample was felt to be a better specimen.  The pt will return in July for her month 9 appointment.  [name] had no appts for the week of July 27th.  The pt will see her MD on 02/22/14, the MD's only available slot in July.    12/07/13 at 4:11pm - The research nurse mailed the pt's questionnaires.  The AFFQ and the APAQ were mailed to the [place]  study in [place], [place].  The GOG scantron was mailed to the GOG [facility] in [place], [place].    12/11/13 at 1:19pm - The research nurse is completing the follow up adverse event (period 2) form.  The research nurse reviewed Dr. Mariana Kaufman note for any new adverse events.  The research nurse called the pt to inquire about her prior AE's reported at period 1 (month 3).  The pt states that her insomnia, fatigue, back and leg pain are ongoing.  She stated that these AE's are "chronic" problems.  She stated "these problems were all present pre-cancer".  The pt denies any new adverse events.  She specifically stated that her AE's have not gotten any worse during her participation on the study.  She said that she has no side effects that has resulted from her participation in the study. She said that she thinks her overall health has improved since joining this study.  The pt was thanked for her support and continued participation in the study.

## 2013-12-07 NOTE — Telephone Encounter (Signed)
Tried again to reach patient to discuss results in detail, and received no answer. -sh

## 2013-12-12 ENCOUNTER — Telehealth: Payer: Self-pay | Admitting: Neurology

## 2013-12-12 ENCOUNTER — Ambulatory Visit (INDEPENDENT_AMBULATORY_CARE_PROVIDER_SITE_OTHER): Payer: Commercial Managed Care - HMO | Admitting: Neurology

## 2013-12-12 ENCOUNTER — Encounter (INDEPENDENT_AMBULATORY_CARE_PROVIDER_SITE_OTHER): Payer: Self-pay

## 2013-12-12 ENCOUNTER — Encounter: Payer: Self-pay | Admitting: Neurology

## 2013-12-12 VITALS — BP 136/93 | HR 103 | Resp 16 | Ht 59.0 in | Wt 187.0 lb

## 2013-12-12 DIAGNOSIS — K146 Glossodynia: Secondary | ICD-10-CM

## 2013-12-12 DIAGNOSIS — E669 Obesity, unspecified: Secondary | ICD-10-CM | POA: Insufficient documentation

## 2013-12-12 DIAGNOSIS — G4700 Insomnia, unspecified: Secondary | ICD-10-CM | POA: Insufficient documentation

## 2013-12-12 MED ORDER — ESZOPICLONE 2 MG PO TABS: 2.0000 mg | ORAL_TABLET | Freq: Every evening | ORAL | Status: DC | PRN

## 2013-12-12 NOTE — Progress Notes (Signed)
[facility]  Provider:  Larey Seat, M D  Referring Provider: Cari Caraway, MD Primary Care Physician:  Cari Caraway, MD   Review sleep study.   HPI:  Kristin Webb is a 67 y.o. female , with  A history of OSA on CPAP -and persistent fatigue, is seen here as a  revisit  from Dr. Addison Lank for CPAP follow up/ compliance visit.   12-12-13, Kristin Webb sleep study dated 11-26-13 short and all residual apnea. The study was therefore a normal polysomnography and not split into a titration parts. The AHI was 0.9, the RDI was 2.4 and the oxygen nadir was 87% time and his saturation was 33.6 minutes at or below 90% saturation of oxygen. Heart which was regular, normal sinus rhythm prevailed. She still sleeps better with Lunesta 2 mg could not get a good result from 1 mg pills. Fragmentation of sleep, alpha intrusion can be seen in chronic pain and fatigue. She is not a coffee drinker, no caffeine to cut out. There is no pain in sleep. Sleep psychology referral is problematic with [facility].  Sleep hygiene was discussed.         Last visit:   Since I have seen her last this patient was diagnosed with ovarian cancer, underwent surgery and chemotherapy. She currently struggles with a weak  abdominal and core musculatur  and it feels to her she had an abdominal hernia. She has tried a corsett , but this has aggravated her bulging discs in her spine.   The patient was originally referred by Dr. Sheryn Bison at the time with fatigue , pain, excessive sleepiness and burning mouth syndrome. In 2010 she developed transiently left mouth droop, and a TIA/ CVA work up was negative. EMG and nerve conduction studies were unrevealing , a rheumatology consult,  pain specialist , neurosurgical evaluation were unrevealing .  Labs revealed a vitamin D deficiency,  Normal TSH and CMET, CBC and neurosurgery consult  For back pain was without any results.  She was referred to me for a sleep consultation  in 2009. The patient was diagnosed with sleep apnea on 03-07-08 with an AHI of 24.3 and in REM AHI of 82. Her BMI at the time was 37.9 , she was titrated to 10 cm water pressure on CPAP  which relieved her AHI but she still snored.  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 She has compliantly  used a  Original machine,  New the CPAP  machine seems to have failed. I am unable to obtain any data. In addition the patient had remained excessively fatigued and daytime sleepy. Possibly , this could have been a paraneoplastic manifestation of ovarian malignancy.  She has been using Nuvigil  as tolerated which has given her improvement in life quality and energy. The last CPAP  download was on 05-06-12 with the 95% percentile pressure of 12 cm water, the residual AHI of 0.5 and an average of 5.9 hours of night-user time for CPAP.   She uses Lunesta for insomnia, doubled the dose at the time of her tumour diagnosis.  She has endorsed the Epworth score at 12 and FSS at 43 points, the GDS at 1 points.  She has almost met her doughnut hole.    Review of Systems: Out of a complete 14 system review, the patient complains of only the following symptoms, and all other reviewed systems are negative. Fatigue , EDS, abdominal distention, dry mouth.   History   Social  History  . Marital Status: Married    Spouse Name: Kristin Webb    Number of Children: 2  . Years of Education: Master's   Occupational History  . Not on file.   Social History Main Topics  . Smoking status: Never Smoker   . Smokeless tobacco: Never Used  . Alcohol Use: No  . Drug Use: No  . Sexual Activity: Not on file   Other Topics Concern  . Not on file   Social History Narrative   Patient is married Kristin Webb) and lives at home with her husband.   Patient has 2 children by birth and 13 children all together.   Patient has a Oceanographer.   Patient is right-handed.   Patient drinks very little  caffeine.             Family History  Problem Relation Age of Onset  . Lung cancer Mother 58  . Lung cancer Father 9    2 ppd smoker  . High blood pressure Mother   . High Cholesterol Mother   . Parkinson's disease Father   . Kidney disease      Past Medical History  Diagnosis Date  . Pelvic mass in female   . Constipation   . Diarrhea     in the past after gallbladder removal  . Hyperlipidemia   . Burning mouth syndrome   . Chronic fatigue   . Fibromyalgia   . Lumbar disc disease   . PONV (postoperative nausea and vomiting)   . Hypertension     borderline not on meds   . Shortness of breath     with exertion   . Sleep apnea     CPAP settings at 13   . Pneumonia     hx of pneumonia   . GERD (gastroesophageal reflux disease)   . Yeast infection   . Ovarian cancer 2014    Past Surgical History  Procedure Laterality Date  . Cholecystectomy      early 13s  . Abdominal hysterectomy      early 1990s  . Trigger finger release    . Laparotomy Bilateral 11/08/2012    Procedure: EXPLORATORY LAPAROTOMY TOTAL ABDOMINAL HYSTERECTOMY BILATERAL SALPINGO OOPHORECTOMY TUMOR DEBULKING ;  Surgeon: Imagene Gurney A. Alycia Rossetti, MD;  Location: [facility];  Service: Gynecology;  Laterality: Bilateral;  APPENDECTOMY / OMENTECTOMY  . Appendectomy    . Exploratory laparotomy  11/08/12    BSO, appendectomy, omentectomy    Current Outpatient Prescriptions  Medication Sig Dispense Refill  . Armodafinil (NUVIGIL) 250 MG tablet Take 1 tablet (250 mg total) by mouth daily.  30 tablet  5  . Calcium Citrate-Vitamin D (CALCIUM CITRATE + D PO) Take 1 capsule by mouth daily.       . Cholecalciferol (VITAMIN D3) 5000 UNITS CAPS Take 5,000 mg by mouth daily.       . CRESTOR 10 MG tablet       . diphenhydrAMINE (BENADRYL) 25 mg capsule Take 50-75 mg by mouth at bedtime as needed for sleep.       Marland Kitchen EPINEPHrine (EPIPEN 2-PAK) 0.3 mg/0.3 mL DEVI Inject 0.3 mg into the muscle once. For bee stings      .  eszopiclone (LUNESTA) 2 MG TABS tablet Take 1 tablet (2 mg total) by mouth at bedtime as needed for sleep. Take immediately before bedtime  30 tablet  4  . fish oil-omega-3 fatty acids 1000 MG capsule Take 1 g by mouth 2 (two) times daily.      Marland Kitchen  FLUoxetine (PROZAC) 10 MG capsule Take 30 mg by mouth every evening.      Marland Kitchen glucosamine-chondroitin 500-400 MG tablet Take 1 tablet by mouth 2 (two) times daily.      Marland Kitchen ibuprofen (ADVIL,MOTRIN) 200 MG tablet Take 200 mg by mouth every 6 (six) hours as needed for pain.      . Melatonin 10 MG TABS Take 10 mg by mouth at bedtime.      . Multiple Vitamin (MULTIVITAMIN WITH MINERALS) TABS Take 1 tablet by mouth daily.      Marland Kitchen OVER THE COUNTER MEDICATION Nystatin topical powder  100,000 units/ GM  Patient uses as needed      . triamterene-hydrochlorothiazide (MAXZIDE-25) 37.5-25 MG per tablet Take 1 tablet by mouth daily.  30 tablet  1   No current facility-administered medications for this visit.    Allergies as of 12/12/2013 - Review Complete 12/12/2013  Allergen Reaction Noted  . Bee venom Hives 11/02/2012  . Cortisone  11/02/2012    Vitals: BP 136/93  Pulse 103  Resp 16  Ht 4' 11"  (1.499 m)  Wt 187 lb (84.823 kg)  BMI 37.75 kg/m2 Last Weight:  Wt Readings from Last 1 Encounters:  12/12/13 187 lb (84.823 kg)   Last Height:   Ht Readings from Last 1 Encounters:  12/12/13 4' 11"  (1.499 m)    Physical exam:  General: The patient is awake, alert and appears not in acute distress. The patient is well groomed. Head: Normocephalic, atraumatic. Neck is supple. Mallampati 3 , neck circumference:  Cardiovascular:  Regular rate and rhythm , without  murmurs or carotid bruit, and without distended neck veins. Respiratory: Lungs are clear to auscultation. Skin:  Without evidence of edema, or rash Trunk: BMI remains  Elevated, this  patient has normal posture.  Neurologic exam : The patient is awake and alert, oriented to place and time.  Memory  subjective  described as intact. There is a normal attention span & concentration ability. Speech is fluent without  dysarthria, dysphonia or aphasia. Mood and affect are appropriate.  Cranial nerves: Pupils are equal and briskly reactive to light. Funduscopic exam without evidence of pallor or edema.  Extraocular movements  in vertical and horizontal planes intact and without nystagmus. Visual fields by finger perimetry are intact. Hearing to finger rub intact.   Facial sensation intact to fine touch. Facial motor strength is symmetric and tongue and uvula move midline.  Motor exam:  Normal tone , l muscle bulk and symmetric strength in all extremities. Grip strength is improved.   Gait and station: Patient walks without assistive device , Strength within normal limits. Stance is stable and normal.  Steps are unfragmented.  Deep tendon reflexes: in the  upper and lower extremities are symmetric and intact.    Assessment:  After physical and neurologic examination, review of laboratory studies, imaging, neurophysiology testing and pre-existing records, assessment is   1) OSA : resolved - AHI 0.9.      Plan: Lunesta 2 mg refill.   Referral for sleep psychology presbytarian counseling by MD or NP [place] , [place].

## 2013-12-12 NOTE — Telephone Encounter (Signed)
Spoke to patient and discussed results and recommendations of past sleep study.  We discussed multiple issues in regards to sleep initiation and sleep maintenance.  Patient has been working with Dr. Brett Fairy for some time.  She observed this prior to her initial dx of sleep apnea however it did not improve after treatment, neither did her daytime fatigue.  She does have a diagnosis of Fibromyalgia and we discussed at length how "non-restorative sleep" fits in with this diagnosis.   She does not require narcotic medication for pain control at night, but she does require it during the daytime.  She has cut her dose from 3 mg to 2 mg, she tried to further reduce to 1 mg but was unsuccessful and had more issues with sleep disruption after 3-4 hours of sleep.  She did put her CPAP on after that because she says it is soothing, "probably because I'm used to it, but it is cumbersome".  She has tried nights with and without CPAP and says the CPAP feels soothing to her however she doesn't really notice any improvement in her sleep quality or sleep initiation.  We discussed Nuvigil which helps her a great deal but is also very expensive.  Her medical bills alone take all of her social security income.  [employer] has the card that could allow her to get 30 pills for free as long as she goes to the pharmacy and does not use her insurance, but this is only a short term help.  She states that she has gotten to the point where she only uses Nuvigil when she is going out, if she is staying home she does not use it.  She notes that she HAS to use it if she is going to drive safely.  We discussed generic Modafinil and she will call her pharmacy today to see how much this would cost her.  I mentioned that some patients are able to get this for $4 and while some people find it may not be as effective as the brand name Provigil or Nuvigil, it still offers some benefit.  This may allow her to have a cheaper alternative or something she can  use when at home. Scheduled appt with Dr. Brett Fairy today at 2:30 PM and she will discuss at length with her and also perhaps look into the sleep psychology referral which was recommended to her.

## 2013-12-12 NOTE — Patient Instructions (Signed)
Insomnia Insomnia is frequent trouble falling and/or staying asleep. Insomnia can be a long term problem or a short term problem. Both are common. Insomnia can be a short term problem when the wakefulness is related to a certain stress or worry. Long term insomnia is often related to ongoing stress during waking hours and/or poor sleeping habits. Overtime, sleep deprivation itself can make the problem worse. Every little thing feels more severe because you are overtired and your ability to cope is decreased. CAUSES   Stress, anxiety, and depression.  Poor sleeping habits.  Distractions such as TV in the bedroom.  Naps close to bedtime.  Engaging in emotionally charged conversations before bed.  Technical reading before sleep.  Alcohol and other sedatives. They may make the problem worse. They can hurt normal sleep patterns and normal dream activity.  Stimulants such as caffeine for several hours prior to bedtime.  Pain syndromes and shortness of breath can cause insomnia.  Exercise late at night.  Changing time zones may cause sleeping problems (jet lag). It is sometimes helpful to have someone observe your sleeping patterns. They should look for periods of not breathing during the night (sleep apnea). They should also look to see how long those periods last. If you live alone or observers are uncertain, you can also be observed at a sleep clinic where your sleep patterns will be professionally monitored. Sleep apnea requires a checkup and treatment. Give your caregivers your medical history. Give your caregivers observations your family has made about your sleep.  SYMPTOMS   Not feeling rested in the morning.  Anxiety and restlessness at bedtime.  Difficulty falling and staying asleep. TREATMENT   Your caregiver may prescribe treatment for an underlying medical disorders. Your caregiver can give advice or help if you are using alcohol or other drugs for self-medication. Treatment  of underlying problems will usually eliminate insomnia problems.  Medications can be prescribed for short time use. They are generally not recommended for lengthy use.  Over-the-counter sleep medicines are not recommended for lengthy use. They can be habit forming.  You can promote easier sleeping by making lifestyle changes such as:  Using relaxation techniques that help with breathing and reduce muscle tension.  Exercising earlier in the day.  Changing your diet and the time of your last meal. No night time snacks.  Establish a regular time to go to bed.  Counseling can help with stressful problems and worry.  Soothing music and white noise may be helpful if there are background noises you cannot remove.  Stop tedious detailed work at least one hour before bedtime. HOME CARE INSTRUCTIONS   Keep a diary. Inform your caregiver about your progress. This includes any medication side effects. See your caregiver regularly. Take note of:  Times when you are asleep.  Times when you are awake during the night.  The quality of your sleep.  How you feel the next day. This information will help your caregiver care for you.  Get out of bed if you are still awake after 15 minutes. Read or do some quiet activity. Keep the lights down. Wait until you feel sleepy and go back to bed.  Keep regular sleeping and waking hours. Avoid naps.  Exercise regularly.  Avoid distractions at bedtime. Distractions include watching television or engaging in any intense or detailed activity like attempting to balance the household checkbook.  Develop a bedtime ritual. Keep a familiar routine of bathing, brushing your teeth, climbing into bed at the same   time each night, listening to soothing music. Routines increase the success of falling to sleep faster.  Use relaxation techniques. This can be using breathing and muscle tension release routines. It can also include visualizing peaceful scenes. You can  also help control troubling or intruding thoughts by keeping your mind occupied with boring or repetitive thoughts like the old concept of counting sheep. You can make it more creative like imagining planting one beautiful flower after another in your backyard garden.  During your day, work to eliminate stress. When this is not possible use some of the previous suggestions to help reduce the anxiety that accompanies stressful situations. MAKE SURE YOU:   Understand these instructions.  Will watch your condition.  Will get help right away if you are not doing well or get worse. Document Released: 07/24/2000 Document Revised: 10/19/2011 Document Reviewed: 08/24/2007 [facility] Patient Information 2014 [place]. Fibromyalgia Fibromyalgia is a disorder that is often misunderstood. It is associated with muscular pains and tenderness that comes and goes. It is often associated with fatigue and sleep disturbances. Though it tends to be long-lasting, fibromyalgia is not life-threatening. CAUSES  The exact cause of fibromyalgia is unknown. People with certain gene types are predisposed to developing fibromyalgia and other conditions. Certain factors can play a role as triggers, such as:  Spine disorders.  Arthritis.  Severe injury (trauma) and other physical stressors.  Emotional stressors. SYMPTOMS   The main symptom is pain and stiffness in the muscles and joints, which can vary over time.  Sleep and fatigue problems. Other related symptoms may include:  Bowel and bladder problems.  Headaches.  Visual problems.  Problems with odors and noises.  Depression or mood changes.  Painful periods (dysmenorrhea).  Dryness of the skin or eyes. DIAGNOSIS  There are no specific tests for diagnosing fibromyalgia. Patients can be diagnosed accurately from the specific symptoms they have. The diagnosis is made by determining that nothing else is causing the problems. TREATMENT  There is  no cure. Management includes medicines and an active, healthy lifestyle. The goal is to enhance physical fitness, decrease pain, and improve sleep. HOME CARE INSTRUCTIONS   Only take over-the-counter or prescription medicines as directed by your caregiver. Sleeping pills, tranquilizers, and pain medicines may make your problems worse.  Low-impact aerobic exercise is very important and advised for treatment. At first, it may seem to make pain worse. Gradually increasing your tolerance will overcome this feeling.  Learning relaxation techniques and how to control stress will help you. Biofeedback, visual imagery, hypnosis, muscle relaxation, yoga, and meditation are all options.  Anti-inflammatory medicines and physical therapy may provide short-term help.  Acupuncture or massage treatments may help.  Take muscle relaxant medicines as suggested by your caregiver.  Avoid stressful situations.  Plan a healthy lifestyle. This includes your diet, sleep, rest, exercise, and friends.  Find and practice a hobby you enjoy.  Join a fibromyalgia support group for interaction, ideas, and sharing advice. This may be helpful. SEEK MEDICAL CARE IF:  You are not having good results or improvement from your treatment. FOR MORE INFORMATION  National Fibromyalgia Association: www.fmaware.[place]: www.arthritis.org Document Released: 07/27/2005 Document Revised: 10/19/2011 Document Reviewed: 11/06/2009 [facility] Patient Information 2014 [place], [place]. Chronic Fatigue Syndrome Chronic Fatigue Syndrome is characterized by extreme fatigue that does not improve with rest. The cause of this condition is unknown.  SYMPTOMS  An unexplained dramatic loss of energy.  Muscle or joint soreness.  Severe weakness.  Frequent headaches.  Fever,  sore throat, and swollen lymph glands.  Sleep problems.  Inability to concentrate. Symptoms must usually be present for over 6 months before the  diagnosis of chronic fatigue can be made. There is no diagnostic test for this disease. Many other diseases can cause similar symptoms. A complete medical evaluation is needed to be sure you do not have other medical problems causing your symptoms. There is no specific treatment for Chronic Fatigue Syndrome. Cognitive behavioral therapy (similar to counseling) and/or a simple exercise regimen may be beneficial. Get plenty of rest and avoid alcohol and other depressant drugs. Call your caregiver for follow up care as recommended. Document Released: 09/03/2004 Document Revised: 10/19/2011 Document Reviewed: 10/26/2008 [facility] Patient Information 2014 [place], [place].

## 2013-12-12 NOTE — Telephone Encounter (Signed)
Patient called wanting to know why after six years, she no longer has obstructive sleep apnea. I informed the patient that I would let her speak with [facility] the sleep lab manager.

## 2013-12-13 ENCOUNTER — Telehealth: Payer: Self-pay | Admitting: *Deleted

## 2013-12-13 NOTE — Telephone Encounter (Signed)
Patient calling to inform Dr Brett Fairy, she feels the referral for sleep psychologist will not be beneficial to her.  She looked up the Psychologist and felt he doesn't have the expertise in sleep disorders.  Please call and advise.

## 2013-12-14 NOTE — Telephone Encounter (Signed)
Dr. Seward Grater called back.  He stated the pt relayed to him that her husband was a therapist (LCSW)?  Pt did not want to see him.  He gave her the name [facility]. [phone]

## 2013-12-14 NOTE — Telephone Encounter (Signed)
Well, that was different. I am sorry  Dr Geoffry Paradise got a consult for nothing. Sorry. CD

## 2014-01-30 ENCOUNTER — Other Ambulatory Visit: Payer: Self-pay | Admitting: *Deleted

## 2014-01-30 DIAGNOSIS — C569 Malignant neoplasm of unspecified ovary: Secondary | ICD-10-CM

## 2014-01-31 ENCOUNTER — Ambulatory Visit (HOSPITAL_BASED_OUTPATIENT_CLINIC_OR_DEPARTMENT_OTHER): Payer: Commercial Managed Care - HMO

## 2014-01-31 ENCOUNTER — Other Ambulatory Visit (HOSPITAL_BASED_OUTPATIENT_CLINIC_OR_DEPARTMENT_OTHER): Payer: Commercial Managed Care - HMO

## 2014-01-31 VITALS — BP 141/91 | HR 84 | Temp 97.8°F

## 2014-01-31 DIAGNOSIS — Z95828 Presence of other vascular implants and grafts: Secondary | ICD-10-CM

## 2014-01-31 DIAGNOSIS — C5700 Malignant neoplasm of unspecified fallopian tube: Secondary | ICD-10-CM

## 2014-01-31 DIAGNOSIS — C569 Malignant neoplasm of unspecified ovary: Secondary | ICD-10-CM

## 2014-01-31 DIAGNOSIS — Z452 Encounter for adjustment and management of vascular access device: Secondary | ICD-10-CM

## 2014-01-31 LAB — CBC WITH DIFFERENTIAL/PLATELET
BASO%: 0.4 % (ref 0.0–2.0)
Basophils Absolute: 0 10*3/uL (ref 0.0–0.1)
EOS%: 1.8 % (ref 0.0–7.0)
Eosinophils Absolute: 0.1 10*3/uL (ref 0.0–0.5)
HCT: 42.6 % (ref 34.8–46.6)
HGB: 14 g/dL (ref 11.6–15.9)
LYMPH%: 35.6 % (ref 14.0–49.7)
MCH: 29.3 pg (ref 25.1–34.0)
MCHC: 32.9 g/dL (ref 31.5–36.0)
MCV: 89.1 fL (ref 79.5–101.0)
MONO#: 0.5 10*3/uL (ref 0.1–0.9)
MONO%: 9.1 % (ref 0.0–14.0)
NEUT#: 2.7 10*3/uL (ref 1.5–6.5)
NEUT%: 53.1 % (ref 38.4–76.8)
Platelets: 142 10*3/uL — ABNORMAL LOW (ref 145–400)
RBC: 4.78 10*6/uL (ref 3.70–5.45)
RDW: 13.3 % (ref 11.2–14.5)
WBC: 5.1 10*3/uL (ref 3.9–10.3)
lymph#: 1.8 10*3/uL (ref 0.9–3.3)

## 2014-01-31 LAB — COMPREHENSIVE METABOLIC PANEL (CC13)
ALT: 28 U/L (ref 0–55)
AST: 16 U/L (ref 5–34)
Albumin: 4 g/dL (ref 3.5–5.0)
Alkaline Phosphatase: 76 U/L (ref 40–150)
Anion Gap: 10 mEq/L (ref 3–11)
BUN: 13.3 mg/dL (ref 7.0–26.0)
CO2: 27 mEq/L (ref 22–29)
Calcium: 10 mg/dL (ref 8.4–10.4)
Chloride: 101 mEq/L (ref 98–109)
Creatinine: 0.8 mg/dL (ref 0.6–1.1)
Glucose: 111 mg/dl (ref 70–140)
Potassium: 3.8 mEq/L (ref 3.5–5.1)
Sodium: 139 mEq/L (ref 136–145)
Total Bilirubin: 0.39 mg/dL (ref 0.20–1.20)
Total Protein: 7 g/dL (ref 6.4–8.3)

## 2014-01-31 MED ORDER — SODIUM CHLORIDE 0.9 % IJ SOLN
10.0000 mL | INTRAMUSCULAR | Status: DC | PRN
  Administered 2014-01-31: 10 mL via INTRAVENOUS
  Filled 2014-01-31: qty 10

## 2014-01-31 MED ORDER — HEPARIN SOD (PORK) LOCK FLUSH 100 UNIT/ML IV SOLN
500.0000 [IU] | Freq: Once | INTRAVENOUS | Status: AC
Start: 2014-01-31 — End: 2014-01-31
  Administered 2014-01-31: 500 [IU] via INTRAVENOUS
  Filled 2014-01-31: qty 5

## 2014-01-31 NOTE — Patient Instructions (Signed)

## 2014-02-12 ENCOUNTER — Other Ambulatory Visit: Payer: Self-pay | Admitting: Oncology

## 2014-02-22 ENCOUNTER — Encounter: Payer: Self-pay | Admitting: Gynecologic Oncology

## 2014-02-22 ENCOUNTER — Other Ambulatory Visit: Payer: Self-pay | Admitting: *Deleted

## 2014-02-22 ENCOUNTER — Ambulatory Visit: Payer: Medicare HMO | Attending: Gynecologic Oncology | Admitting: Gynecologic Oncology

## 2014-02-22 ENCOUNTER — Telehealth: Payer: Self-pay | Admitting: Oncology

## 2014-02-22 ENCOUNTER — Encounter: Payer: Self-pay | Admitting: *Deleted

## 2014-02-22 VITALS — BP 150/103 | HR 99 | Temp 97.7°F | Resp 18 | Ht 59.0 in | Wt 176.8 lb

## 2014-02-22 DIAGNOSIS — M5430 Sciatica, unspecified side: Secondary | ICD-10-CM

## 2014-02-22 DIAGNOSIS — C5700 Malignant neoplasm of unspecified fallopian tube: Secondary | ICD-10-CM | POA: Diagnosis not present

## 2014-02-22 DIAGNOSIS — G4730 Sleep apnea, unspecified: Secondary | ICD-10-CM | POA: Diagnosis not present

## 2014-02-22 DIAGNOSIS — I10 Essential (primary) hypertension: Secondary | ICD-10-CM | POA: Diagnosis not present

## 2014-02-22 DIAGNOSIS — K219 Gastro-esophageal reflux disease without esophagitis: Secondary | ICD-10-CM | POA: Insufficient documentation

## 2014-02-22 DIAGNOSIS — IMO0001 Reserved for inherently not codable concepts without codable children: Secondary | ICD-10-CM | POA: Diagnosis not present

## 2014-02-22 DIAGNOSIS — E785 Hyperlipidemia, unspecified: Secondary | ICD-10-CM | POA: Diagnosis not present

## 2014-02-22 DIAGNOSIS — Z9221 Personal history of antineoplastic chemotherapy: Secondary | ICD-10-CM | POA: Diagnosis not present

## 2014-02-22 DIAGNOSIS — Z90710 Acquired absence of both cervix and uterus: Secondary | ICD-10-CM | POA: Insufficient documentation

## 2014-02-22 DIAGNOSIS — Z8544 Personal history of malignant neoplasm of other female genital organs: Secondary | ICD-10-CM

## 2014-02-22 DIAGNOSIS — C569 Malignant neoplasm of unspecified ovary: Secondary | ICD-10-CM

## 2014-02-22 NOTE — Progress Notes (Signed)
Consult Note: Gyn-Onc  Kristin Webb 68 y.o. female  CC:  Chief Complaint  Patient presents with  . Fallopian tube Carcinoma    HPI: Kristin Webb is a 68 year old, gravida 2 para 2, who noticed increasing abdominal swelling around the third week of January. Initially, the abdominal swelling was intermittent and would come and go. She related this to recent initiation of a calcium supplement. The abdominal swelling became more prevalent starting in February with abdominal pain reported also. In addition, she started having increasing constipation and formed stools, which was a change in her routine of 5-6 loose bowel movements a day chronically after her cholecystectomy. On November 01, 2012, she had a CT scan of the abdomen and pelvis which resulted no significant biliary dilation, no suspicious liver lesions, and the spleen, adrenal glands and pancreas appeared normal. 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 She has a large midabdominal mass measuring 17 x 22.7 cm x 20 cm cephalad, which was well circumscribed without calcifications. There is irregular thickened septations and areas of solid nodularity consistent with malignancy. There is no bowel obstruction and the mass appeared to be separate from the appendix. There was probable normal left ovarian tissue seen. The uterus is surgically absent. There is some soft tissue stranding at the base of the mesentery superior to the mass and some omental nodularity.    On November 08, 2012, she underwent an exploratory laparotomy, BSO, appendectomy, infracolic omentectomy, and optimal debulking. Operative findings included: 25 cm right adnexal mass with smooth surface. Surgically absent uterus. Atrophic-appearing left ovary. Normal appearing appendix. Within the omentum there were centimeter nodules scattered throughout the omentum. The remainder of the surfaces were benign. Final  pathology revealed:  1. 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 - BENIGN FALLOPIAN TUBE WITH NONSPECIFIC CHRONIC INFLAMMATION. 2. Ovary and fallopian tube, left - BENIGN OVARY; NEGATIVE FOR ATYPIA OR MALIGNANCY. - BENIGN FALLOPIAN TUBE; NEGATIVE FOR ATYPIA OR MALIGNANCY. 3. Omentum, resection for tumor - HIGH GRADE CARCINOMA, SEE COMMENT. 4. Appendix, Other than Incidental - FIBROUS OBLITERATION OF APPENDICEAL TIP. - NEGATIVE FOR MALIGNANCY.  She completed day 1, cycle 6 of 6 planned treatments of carboplatin and taxol on 03/28/13.   9/14 CT Findings: The liver is diffusely fatty infiltrated and measures 18.7 cm in cranial caudal length. No focal intrahepatic parenchymal abnormality. The spleen is unremarkable. The stomach, duodenum, pancreas and adrenal glands are unremarkable. The gallbladder is surgically absent. 11 x 9 x 11 mm nonobstructing stone is identified in the interpolar right kidney. Stable appearance of a 9 mm fatty lesion in the right kidney, likely a tiny angiomyolipoma. No abdominal aortic aneurysm. No free fluid in the abdomen. No abdominal lymphadenopathy. Haziness in the root of the small bowel mesentery is stable. Approximately 8 cm cranial to the umbilicus is an area of apparent focal midline fascial laxity. I cannot identify a discrete fascial defect to suggest an overt hernia. This is a wide-mouthed fascial bulge and some of the transverse colon projects out into this area of fascial bulging. No bowel wall thickening, adjacent edema, orfluid to suggest complication. Imaging through the pelvis shows no free intraperitoneal fluid. No pelvic sidewall lymphadenopathy. The uterus is surgically absent. The large complex cystic lesion seen in the right central abdomen and  pelvis has been resected in the interval. No substantial diverticular  disease in the colon. There is no colonic diverticulitis. Terminal ileum is normal. The appendix is not visualized, but there is no edema or inflammation in the region of the cecum. The right gonadal vein is enlarged and contains a central filling defect consistent with thrombus. Bone windows reveal no worrisome lytic or sclerotic osseous lesions.  IMPRESSION:  Interval resection of the large right pelvic and lower abdominal mass lesion with apparent omentectomy. No evidence for intraperitoneal free fluid on today's study. No discernible peritoneal lesions. 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  Interval History:  She was last seen by GYN oncology in 2/15. At that time her exam was negative. She was last seen by Dr. Marko Plume in April 2015. Exam at that time was similarly unremarkable. Her CA 125 was 3.8 when last drawn. She comes in today for followup. She had a colonoscopy less than a year ago. She is on GOG 225. She has lost 18# since I last saw her in February. She's currently not exercising as much as. She had been up to 6000 steps per day and was doing water aerobics 3 times a week. She was decreasing her portion sizes of needing more help be free. However she began experiencing increased right sciatic pain. She saw her neurosurgeon. She states that previously she had issues with 3 of her discs now she is issues with both her discs. He recommended continuing her water aerobics. She's not really done that as she complains that it hurts and she's not sure if she is doing well "damaged" been good she had about 5 days of feeling cold with increasing urinary frequency and urgency. She saw Dr. Addison Lank and was given ciprofloxacin. The antibiotics are helping her feel better and that she has decreased abdominal pain. The culture results are still pending.  Review of Systems:  Constitutional: Feels tired during the day. She says she feels better now than she has in years. She denies any fevers Skin: +  Rash consistent with Candida Cardiovascular: No chest pain,  + shortness of breath with exertion  Pulmonary: No cough Gastro Intestinal: No nausea, vomiting, constipation, or diarrhea reported. No bright red blood per rectum or change in bowel movement.  Genitourinary: had +frequency, +urgency, no dysuria.  Denies vaginal bleeding and discharge.  Musculoskeletal: She has some back pain as well as leg pain on the right. She's been seen by neurosurgery and has 4 compressed discs. Neurologic: No weakness, numbness, or change in gait.  Psychology: No changes   Current Meds:  Outpatient Encounter Prescriptions as of 02/22/2014  Medication Sig  . Armodafinil (NUVIGIL) 250 MG tablet Take 1 tablet (250 mg total) by mouth daily.  . Calcium Citrate-Vitamin D (CALCIUM CITRATE + D PO) Take 1 capsule by mouth daily.   . Cholecalciferol (VITAMIN D3) 5000 UNITS CAPS Take 5,000 mg by mouth daily.   . ciprofloxacin (CIPRO) 500 MG tablet   . CRESTOR 10 MG tablet   . EPINEPHrine (EPIPEN 2-PAK) 0.3 mg/0.3 mL DEVI Inject 0.3 mg into the muscle once. For bee stings  . eszopiclone (LUNESTA) 2 MG TABS tablet Take 1 tablet (2 mg total) by mouth at bedtime as needed for sleep. Take immediately before bedtime  . eszopiclone (LUNESTA) 2 MG TABS tablet Take 1 tablet (2 mg total) by mouth at bedtime as needed for sleep. Take immediately before bedtime  . fish oil-omega-3 fatty acids 1000 MG capsule Take 1 g by  mouth 2 (two) times daily.  Marland Kitchen FLUoxetine (PROZAC) 10 MG capsule Take 30 mg by mouth every evening.  Marland Kitchen glucosamine-chondroitin 500-400 MG tablet Take 1 tablet by mouth 2 (two) times daily.  Marland Kitchen ibuprofen (ADVIL,MOTRIN) 200 MG tablet Take 200 mg by mouth every 6 (six) hours as needed for pain.  . Melatonin 10 MG TABS Take 10 mg by mouth at bedtime.  . Multiple Vitamin (MULTIVITAMIN WITH MINERALS) TABS Take 1 tablet by mouth daily.  Marland Kitchen OVER THE COUNTER MEDICATION Nystatin topical powder  100,000 units/ GM  Patient  uses as needed  . triamterene-hydrochlorothiazide (MAXZIDE-25) 37.5-25 MG per tablet Take 1 tablet by mouth daily.  . [DISCONTINUED] diphenhydrAMINE (BENADRYL) 25 mg capsule Take 50-75 mg by mouth at bedtime as needed for sleep.     Allergy:  Allergies  Allergen Reactions  . Bee Venom Hives    Difficulty breathing, carries an EPI-pen  . Cortisone     Injected cortisone, "sick to my stomach, throwing up, hand swelling, and pain."    Social Hx:   History   Social History  . Marital Status: Married    Spouse Name: [name]    Number of Children: 2  . Years of Education: Master's   Occupational History  . Not on file.   Social History Main Topics  . Smoking status: Never Smoker   . Smokeless tobacco: Never Used  . Alcohol Use: No  . Drug Use: No  . Sexual Activity: Not on file   Other Topics Concern  . Not on file   Social History Narrative   Patient is married [name]) and lives at home with her husband.   Patient has 2 children by birth and 13 children all together.   Patient has a Oceanographer.   Patient is right-handed.   Patient drinks very little caffeine.             Past Surgical Hx:  Past Surgical History  Procedure Laterality Date  . Cholecystectomy      early 34s  . Abdominal hysterectomy      early 1990s  . Trigger finger release    . Laparotomy Bilateral 11/08/2012    Procedure: EXPLORATORY LAPAROTOMY TOTAL ABDOMINAL HYSTERECTOMY BILATERAL SALPINGO OOPHORECTOMY TUMOR DEBULKING ;  Surgeon: Imagene Gurney A. Alycia Rossetti, MD;  Location: [facility];  Service: Gynecology;  Laterality: Bilateral;  APPENDECTOMY / OMENTECTOMY  . Appendectomy    . Exploratory laparotomy  11/08/12    BSO, appendectomy, omentectomy    Past Medical Hx:  Past Medical History  Diagnosis Date  . Pelvic mass in female   . Constipation   . Diarrhea     in the past after gallbladder removal  . Hyperlipidemia   . Burning mouth syndrome   . Chronic fatigue   . Fibromyalgia   . Lumbar disc  disease   . PONV (postoperative nausea and vomiting)   . Hypertension     borderline not on meds   . Shortness of breath     with exertion   . Sleep apnea     CPAP settings at 13   . Pneumonia     hx of pneumonia   . GERD (gastroesophageal reflux disease)   . Yeast infection   . 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 negatve post treatmen CT 9/14    Fallopian tube carcinoma   11/01/2012 Initial Diagnosis Fallopian tube carcinoma   11/08/2012 Surgery Debulking, optima. IA ovarian and IIIB fallopian tube   12/13/2012 - 03/28/2013 Chemotherapy s/p 6 cycles of paclitaxel and carboplatin    Family Hx:  Family History  Problem Relation Age of Onset  . Lung cancer Mother 107  . Lung cancer Father 5    2 ppd smoker  . High blood pressure Mother   . High Cholesterol Mother   . Parkinson's disease Father   . Kidney disease      Vitals:  Blood pressure 150/103, pulse 99, temperature 97.7 F (36.5 C), temperature source Oral, resp. rate 18, height 4\' 11"  (1.499 m), weight 176 lb 12.8 oz (80.196 kg).  Physical Exam: General: Well developed, well nourished female in no acute distress. Alert and oriented x 3.   Head/Neck:  Supple without any enlargements.   Lymph node survey: No cervical, supraclavicular, or inguinal adenopathy.   Cardiovascular: Regular rate and rhythm.   Lungs: Clear to auscultation bilaterally.   Abdomen: Abdomen soft, non-tender and obese. Active bowel sounds in all quadrants. No evidence of a fluid wave. Midline abdominal incision well healed. Questionable hernia versus laxity of the abdominal wall.  Genitourinary:  Vulva/vagina: Normal  external female genitalia. No lesions.  Urethra: No lesions or masses.  Vagina: No masses or nodularity Rectal: Good tone, no palpable masses or nodularity.   Extremities: No bilateral cyanosis, edema, or clubbing.     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 She was completely resected to an R0. She's completed her chemotherapy and had a post treatment CT scan that was negative in 9/14. Her last CA 125 was normal at 3.8 in April. Has never been elevated.  Per protocol she will follow up with Dr. Marko Plume in October and return to see [place] per protocol approximately 3 months after that. She's not need another CA 125 today. She was congratulated on her weight loss. She continues to have significant right sciatic pain. She was encouraged to call her neurosurgeon's office recommendations and consideration of physical therapy. She'll continue on her antibiotics per Dr. Addison Lank.   Sya Nestler A., MD 02/22/2014, 10:46 AM

## 2014-02-22 NOTE — Patient Instructions (Addendum)
Followup Dr. Marko Plume in October and return to see [place] in 3 months after that

## 2014-02-22 NOTE — Telephone Encounter (Signed)
s/w pt re appt for 10.26.15. also confirmed 8.19.15.

## 2014-02-22 NOTE — Progress Notes (Signed)
02/22/14 GOG 225 - month 9 on-study visit - The pt was into the cancer center today for her month 9 assessments.  The pt's weight was obtained, and the pt was happy that she had lost 11 lbs since her last visit in May. She feels that her weight loss is related to the study.  weight loss (grade 1).  The pt has lost a total 12 lbs since her baseline visit.   Dr. Marko Plume did not feel that the pt needed another CA-125 at this visit.  The pt was seen and examined today by her gynecologist, [name].  The research nurse checked the pt's waist measurement according to section 4.61 of the protocol, and it was determined to be 116 cm (46 inches).  The pt denies any new adverse events that is associated with her diet and exercise interventions.  She reports that she is recovering from a recent UTI. The pt states that she is feeling well overall.  The pt was informed that she will have to complete her questionnaires at her next visit.  The pt was also reminded that her research labs will be drawn at this visit.

## 2014-03-15 NOTE — Telephone Encounter (Signed)
Noted  

## 2014-03-27 ENCOUNTER — Other Ambulatory Visit: Payer: Self-pay | Admitting: *Deleted

## 2014-03-27 DIAGNOSIS — C569 Malignant neoplasm of unspecified ovary: Secondary | ICD-10-CM

## 2014-03-27 DIAGNOSIS — C5700 Malignant neoplasm of unspecified fallopian tube: Secondary | ICD-10-CM

## 2014-03-28 ENCOUNTER — Other Ambulatory Visit (HOSPITAL_BASED_OUTPATIENT_CLINIC_OR_DEPARTMENT_OTHER): Payer: Commercial Managed Care - HMO

## 2014-03-28 ENCOUNTER — Ambulatory Visit (HOSPITAL_BASED_OUTPATIENT_CLINIC_OR_DEPARTMENT_OTHER): Payer: Commercial Managed Care - HMO

## 2014-03-28 VITALS — BP 129/108 | HR 80 | Temp 98.1°F | Resp 15

## 2014-03-28 DIAGNOSIS — C5700 Malignant neoplasm of unspecified fallopian tube: Secondary | ICD-10-CM

## 2014-03-28 DIAGNOSIS — C569 Malignant neoplasm of unspecified ovary: Secondary | ICD-10-CM

## 2014-03-28 DIAGNOSIS — Z452 Encounter for adjustment and management of vascular access device: Secondary | ICD-10-CM

## 2014-03-28 DIAGNOSIS — Z95828 Presence of other vascular implants and grafts: Secondary | ICD-10-CM

## 2014-03-28 LAB — CBC WITH DIFFERENTIAL/PLATELET
BASO%: 0.6 % (ref 0.0–2.0)
Basophils Absolute: 0 10*3/uL (ref 0.0–0.1)
EOS%: 2.8 % (ref 0.0–7.0)
Eosinophils Absolute: 0.1 10*3/uL (ref 0.0–0.5)
HCT: 42 % (ref 34.8–46.6)
HGB: 13.7 g/dL (ref 11.6–15.9)
LYMPH%: 41.4 % (ref 14.0–49.7)
MCH: 28.7 pg (ref 25.1–34.0)
MCHC: 32.5 g/dL (ref 31.5–36.0)
MCV: 88.3 fL (ref 79.5–101.0)
MONO#: 0.4 10*3/uL (ref 0.1–0.9)
MONO%: 9.1 % (ref 0.0–14.0)
NEUT#: 2.2 10*3/uL (ref 1.5–6.5)
NEUT%: 46.1 % (ref 38.4–76.8)
Platelets: 159 10*3/uL (ref 145–400)
RBC: 4.76 10*6/uL (ref 3.70–5.45)
RDW: 13.7 % (ref 11.2–14.5)
WBC: 4.8 10*3/uL (ref 3.9–10.3)
lymph#: 2 10*3/uL (ref 0.9–3.3)

## 2014-03-28 LAB — COMPREHENSIVE METABOLIC PANEL (CC13)
ALT: 18 U/L (ref 0–55)
AST: 15 U/L (ref 5–34)
Albumin: 3.8 g/dL (ref 3.5–5.0)
Alkaline Phosphatase: 78 U/L (ref 40–150)
Anion Gap: 11 mEq/L (ref 3–11)
BUN: 12.9 mg/dL (ref 7.0–26.0)
CO2: 26 mEq/L (ref 22–29)
Calcium: 9.7 mg/dL (ref 8.4–10.4)
Chloride: 103 mEq/L (ref 98–109)
Creatinine: 0.7 mg/dL (ref 0.6–1.1)
Glucose: 105 mg/dl (ref 70–140)
Potassium: 3.8 mEq/L (ref 3.5–5.1)
Sodium: 140 mEq/L (ref 136–145)
Total Bilirubin: 0.28 mg/dL (ref 0.20–1.20)
Total Protein: 6.9 g/dL (ref 6.4–8.3)

## 2014-03-28 MED ORDER — HEPARIN SOD (PORK) LOCK FLUSH 100 UNIT/ML IV SOLN
500.0000 [IU] | Freq: Once | INTRAVENOUS | Status: AC
  Administered 2014-03-28: 500 [IU] via INTRAVENOUS
  Filled 2014-03-28: qty 5

## 2014-03-28 MED ORDER — SODIUM CHLORIDE 0.9 % IJ SOLN
10.0000 mL | INTRAMUSCULAR | Status: DC | PRN
  Administered 2014-03-28: 10 mL via INTRAVENOUS
  Filled 2014-03-28: qty 10

## 2014-03-28 NOTE — Patient Instructions (Signed)

## 2014-03-30 ENCOUNTER — Telehealth: Payer: Self-pay

## 2014-03-30 NOTE — Telephone Encounter (Signed)
Message copied by Baruch Merl on Fri Mar 30, 2014  5:09 PM ------      Message from: Gordy Levan      Created: Fri Mar 30, 2014  4:27 PM       Labs seen and need follow up: please let her know blood counts and chemistries all good ------

## 2014-03-30 NOTE — Telephone Encounter (Signed)
Told Ms. Fulwider the results of the labs from 03-28-14 as noted below by Dr. Marko Plume.  Pt. Verbalized understanding.

## 2014-04-25 ENCOUNTER — Ambulatory Visit: Payer: Self-pay | Admitting: Neurology

## 2014-05-28 ENCOUNTER — Telehealth: Payer: Self-pay | Admitting: *Deleted

## 2014-05-28 NOTE — Telephone Encounter (Signed)
05/28/14 at 11:02am -  The research nurse called the pt this morning to remind her about her appointments on 06/04/14.  The pt was aware of her appointments next week. The research nurse informed the pt that she needs to be fasting for at least 8 hours prior to her blood draws. The pt verbalized understanding.  The pt was told that she is free to eat after her labs are drawn. The pt was told that her height, weight, and waist measurement will be obtained at this visit.  The pt was also reminded that she will need to complete her month 12 questionnaires.  The pt reports that she has been doing well.  The pt was told that Raoul Pitch, Rn will see her at her visit.

## 2014-05-29 ENCOUNTER — Other Ambulatory Visit: Payer: Self-pay

## 2014-05-29 DIAGNOSIS — G4700 Insomnia, unspecified: Secondary | ICD-10-CM

## 2014-05-29 DIAGNOSIS — K146 Glossodynia: Secondary | ICD-10-CM

## 2014-05-29 MED ORDER — ESZOPICLONE 2 MG PO TABS: 2.0000 mg | ORAL_TABLET | Freq: Every evening | ORAL | Status: DC | PRN

## 2014-05-30 NOTE — Telephone Encounter (Signed)
Rx signed and faxed.

## 2014-06-03 ENCOUNTER — Other Ambulatory Visit: Payer: Self-pay | Admitting: Oncology

## 2014-06-04 ENCOUNTER — Other Ambulatory Visit: Payer: Commercial Managed Care - HMO

## 2014-06-04 ENCOUNTER — Telehealth: Payer: Self-pay | Admitting: Oncology

## 2014-06-04 ENCOUNTER — Ambulatory Visit (HOSPITAL_BASED_OUTPATIENT_CLINIC_OR_DEPARTMENT_OTHER): Payer: Commercial Managed Care - HMO

## 2014-06-04 ENCOUNTER — Encounter: Payer: Self-pay | Admitting: *Deleted

## 2014-06-04 ENCOUNTER — Ambulatory Visit (HOSPITAL_BASED_OUTPATIENT_CLINIC_OR_DEPARTMENT_OTHER): Payer: Commercial Managed Care - HMO | Admitting: Oncology

## 2014-06-04 ENCOUNTER — Encounter: Payer: Self-pay | Admitting: Oncology

## 2014-06-04 VITALS — BP 146/92 | HR 78 | Temp 97.7°F | Resp 20 | Ht 59.5 in | Wt 179.0 lb

## 2014-06-04 DIAGNOSIS — Z452 Encounter for adjustment and management of vascular access device: Secondary | ICD-10-CM

## 2014-06-04 DIAGNOSIS — C5701 Malignant neoplasm of right fallopian tube: Secondary | ICD-10-CM

## 2014-06-04 DIAGNOSIS — Z23 Encounter for immunization: Secondary | ICD-10-CM

## 2014-06-04 DIAGNOSIS — C569 Malignant neoplasm of unspecified ovary: Secondary | ICD-10-CM

## 2014-06-04 DIAGNOSIS — Z95828 Presence of other vascular implants and grafts: Secondary | ICD-10-CM

## 2014-06-04 LAB — RESEARCH LABS

## 2014-06-04 MED ORDER — HEPARIN SOD (PORK) LOCK FLUSH 100 UNIT/ML IV SOLN
500.0000 [IU] | Freq: Once | INTRAVENOUS | Status: AC
  Administered 2014-06-04: 500 [IU] via INTRAVENOUS
  Filled 2014-06-04: qty 5

## 2014-06-04 MED ORDER — SODIUM CHLORIDE 0.9 % IJ SOLN
10.0000 mL | INTRAMUSCULAR | Status: DC | PRN
  Administered 2014-06-04: 10 mL via INTRAVENOUS
  Filled 2014-06-04: qty 10

## 2014-06-04 MED ORDER — INFLUENZA VAC SPLIT QUAD 0.5 ML IM SUSY
0.5000 mL | PREFILLED_SYRINGE | Freq: Once | INTRAMUSCULAR | Status: DC
  Filled 2014-06-04: qty 0.5

## 2014-06-04 NOTE — Telephone Encounter (Signed)
, °

## 2014-06-04 NOTE — Progress Notes (Unsigned)
06/04/14 @ 11:00 am, GOG-0225, Twelve Month Visit:  Kristin Webb into the [facility] for labs and to see Dr. Marko Plume.  Provided her with her questionnaires upon arrival.  After lab work was completed, a height and weight were obtained, both without shoes.  Her waist measurement was taken at the umbilicus with the Gulick II tape measure per protocol and device instructions.  Her waist measurement was 103.0 cm.  She reports swimming, doing Yoga, Tai-Chi and walking up to 7000 steps per day.    Kristin Webb continues to experience insomnia, fatigue, low back pain and has had another UTI recently.  She reports no leg pain after discontinuation of water aerobics.  She is now doing "free swimming" instead. She said she actually feels better than she has in years.  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

## 2014-06-04 NOTE — Progress Notes (Signed)
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  She is participating in [place] and exercise study, now month 12. She saw Dr Alycia Rossetti last in July and will see her again ~ 3 months after today's visit. Last imaging was CT AP 04-2013. CA 125 has not been elevated thru course to date.  Patient is feeling "the best I have in years", tho she did have UTI recently which needed antibiotics x3 courses by PCP to clear. Prior to the UTI, she had been walking 7000 steps daily, up from 1500 when she began the GOG 225 program. She did not tolerate water aerobics due to chronic back and leg pain, but has continued tai chi and yoga, as well as some gentle water exercising. Previous urinary incontinence has resolved with pelvic physical therapy. Sleep apnea has resolved with weight loss thus far, no longer using CPAP. She states that balance and breathing are improved.  ECOG 0-1 (chronic back/LE symptoms)  She has PAC, flushed today. She prefers to keep this now, as peripheral IV access is difficult. She has had flu vaccine. Genetics testing negative spring 2015.  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 negatve post treatmen CT 9/14    Fallopian tube carcinoma   11/01/2012 Initial Diagnosis Fallopian  tube carcinoma   11/08/2012 Surgery Debulking, optima. IA ovarian and IIIB fallopian tube   12/13/2012 - 03/28/2013 Chemotherapy s/p 6 cycles of paclitaxel and carboplatin  Patient presented in ~ Jan 2014 with complaints of abdominal distension and change in bowels. She had had unremarkable colonoscopy July 2013 (not in this EMR, and patient does not recall which MD). She is post hysterectomy in 1990s. Abdominal symptoms did not improve with interventions for constipation, and CT AP was done in [facility] system 11-01-12 with finding of large mid abdominal mass. She was referred to Dr Alycia Rossetti. Preop CA 125 is not available in this EMR; preop CXR had some linear scarring in lingula without acute findings. She went to exploratory laparotomy, bilateral salpingo-oophorectomy, appendectomy, and infacolic omentectomy, which was optimal debulking, by Dr Alycia Rossetti on 11-08-2012. Operative findings: 25 cm right adnexal mass with smooth surface. Surgically absent uterus. Atrophic-appearing left ovary. Within the omentum there were centimeter nodules scattered throughout. The remainder of the surfaces were benign. 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 She had PAC placed by IR and 6 cycles of taxol carbo from 12-13-12 thru 03-28-13, with neulasta support. She had negative BRCA and breast/ovarian cancer syndrome genetic screening in spring 2015.   Review of systems as above, also: Ventral abdominal wall laxity vs hernia seems slightly larger to patient, no pain, does not tolerate support garments well due to pressure on low back. No SOB or other respiratory symptoms. No fever. No  bleeding. Bowels unchanged and bladder better as above. No LE swelling. Uses Nuvigil most days. No problems with PAC. Remainder of 10 point Review of Systems negative.  Objective:  Vital signs in last 24 hours:  BP 146/92  Pulse 78  Temp(Src) 97.7 F (36.5 C)  (Oral)  Resp 20  Ht 4' 11.5" (1.511 m)  Wt 179 lb (81.194 kg)  BMI 35.56 kg/m2 Weight had been ~ 188 at completion of chemo.  Alert, oriented and appropriate, looks better overall than I have seen her previously. Ambulatory without assistance, sits cross legged due to back and LE pain (as is usual)  No alopecia  HEENT:PERRL, sclerae not icteric. Oral mucosa moist without lesions, posterior pharynx clear.  Neck supple. No JVD.  Lymphatics:no cervical,suraclavicular, axillary or inguinal adenopathy Resp: clear to auscultation bilaterally and normal percussion bilaterally Cardio: regular rate and rhythm. No gallop. GI: soft, nontender, laxity vs hernia upper midline ventral at least 10 cm diameter all above umbilicus, sort and not tender. , No appreciable mass or organomegaly. Normally active bowel sounds. Musculoskeletal/ Extremities: without pitting edema, cords, tenderness Neuro: nonfocal PSYCH appropriate mood and affect Skin without rash, ecchymosis, petechiae Breasts: without dominant mass, skin or nipple findings. Axillae benign. Portacath-without erythema or tenderness  Lab Results:  Results for orders placed in visit on 06/04/14  [place].       Studies/Results:  No results found. No clinical concern requiring repeat scans at present  Medications: I have reviewed the patient's current medications. She had flu vaccine.  DISCUSSION: PAC as above. Encouraged her to continue good progress with diet and exercise.  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 Continuing GOG 225 diet and exercise study. She will see Dr Alycia Rossetti in ~ late Jan. 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 Patient aware that weight  loss would be beneficial prior to considering surgical repair.  4.PAC in: needs flush every 6-8 weeks.  5. lumbar disc disease symptomatic with LLE pain: Dr Ashok Pall has seen previously 6.elevated BP: continuing Maxzide. PCP will manage after patient is back at that office for PE.  7.obesity: BMI down from 38.7 to 35.6. GOG 225 diet and exercise study.  8.Narcolepsy- type disorder, fibromyalgia. On Nuvigil per Dr Brett Fairy. Sleep apnea has improved such that she no longer needs CPAP 9.negative BRCA and breast/ovarian cancer syndrome testing spring 2015. 10.flu vaccine done 11.recent UTI resolved.   All questions answered. Patient understands recommendations and is in agreement with plans.   Kristin Webb P, MD   06/04/2014, 8:56 PM

## 2014-06-04 NOTE — Patient Instructions (Signed)

## 2014-06-05 ENCOUNTER — Telehealth: Payer: Self-pay | Admitting: *Deleted

## 2014-06-05 LAB — CA 125(PREVIOUS METHOD): CA 125: 4.4 U/mL (ref 0.0–30.2)

## 2014-06-05 LAB — CA 125: CA 125: 5 U/mL (ref <35)

## 2014-06-05 NOTE — Telephone Encounter (Signed)
Received call from Viola, RN in research stating that pt will need to get her PAC flushed every 6-8 weeks since she isn't under active treatment and asked if I could arrange that. Told her I would - scheduled flush appt in 07/2014, 09/2014, and 11/2014. Called pt with dates and times of appts and pt wrote them down and is agreeable to come at scheduled times.

## 2014-06-14 ENCOUNTER — Ambulatory Visit (INDEPENDENT_AMBULATORY_CARE_PROVIDER_SITE_OTHER): Payer: Commercial Managed Care - HMO | Admitting: Nurse Practitioner

## 2014-06-14 ENCOUNTER — Encounter: Payer: Self-pay | Admitting: Nurse Practitioner

## 2014-06-14 VITALS — BP 139/100 | HR 90 | Temp 98.1°F | Ht 60.0 in | Wt 172.0 lb

## 2014-06-14 DIAGNOSIS — G4700 Insomnia, unspecified: Secondary | ICD-10-CM

## 2014-06-14 DIAGNOSIS — G4719 Other hypersomnia: Secondary | ICD-10-CM

## 2014-06-14 DIAGNOSIS — K146 Glossodynia: Secondary | ICD-10-CM

## 2014-06-14 DIAGNOSIS — G4710 Hypersomnia, unspecified: Secondary | ICD-10-CM

## 2014-06-14 DIAGNOSIS — G4730 Sleep apnea, unspecified: Secondary | ICD-10-CM

## 2014-06-14 MED ORDER — ESZOPICLONE 2 MG PO TABS: 2.0000 mg | ORAL_TABLET | Freq: Every evening | ORAL | Status: DC | PRN

## 2014-06-14 MED ORDER — ARMODAFINIL 250 MG PO TABS: 250.0000 mg | ORAL_TABLET | Freq: Every day | ORAL | Status: DC

## 2014-06-14 NOTE — Progress Notes (Signed)
PATIENT: Kristin Webb DOB: [birth year]  REASON FOR VISIT: routine follow up for OSA on CPAP, persistent fatigue HISTORY FROM: patient  HISTORY OF PRESENT ILLNESS: Kristin Webb is a 68 y.o. female with a history of OSA on CPAP -and persistent fatigue, is seen here as a  revisit  from Dr. Addison Lank for fatigue and hypersomnia.  Kristin Webb returns for followup of insomnia and fatigue.  She reports that she feels better than she has in a long time. She is doing Tai Chi, Yoga and swimming. She had trouble with Sciatica over the summer when doing water aerobics, so she went back to plain swimming. She talked to the sleep psychologist that was recommended about what he had to offer, and decided he could not really do anything for her that she wasn't doing already, since her husband is a licensed hypnotherapist and she knows all about sleep hygiene. She did not think it would be worth the $45 per weekly visit. She uses Nuvigil only when absolutely needed due to high cost. She is currently using Lunesta 2 mg with good results, usually taking 1 Benadryl tablet to initiate sleep quicker.  12-12-13, [name] sleep study dated 11-26-13 short and all residual apnea. The study was therefore a normal polysomnography and not split into a titration parts. The AHI was 0.9, the RDI was 2.4 and the oxygen nadir was 87% time and his saturation was 33.6 minutes at or below 90% saturation of oxygen. Heart which was regular, normal sinus rhythm prevailed. She still sleeps better with Lunesta 2 mg could not get a good result from 1 mg pills. Fragmentation of sleep, alpha intrusion can be seen in chronic pain and fatigue. She is not a coffee drinker, no caffeine to cut out. There is no pain in sleep. Sleep psychology referral is problematic with [facility].   Sleep hygiene was discussed.     11/24/13 Since I have seen her last this patient was diagnosed with ovarian cancer, underwent surgery and chemotherapy. She  currently struggles with a weak  abdominal and core musculatur  and it feels to her she had an abdominal hernia. She has tried a corsett , but this has aggravated her bulging discs in her spine.   The patient was originally referred by Dr. Sheryn Bison at the time with fatigue , pain, excessive sleepiness and burning mouth syndrome. In 2010 she developed transiently left mouth droop, and a TIA/ CVA work up was negative. EMG and nerve conduction studies were unrevealing , a rheumatology consult,  pain specialist , neurosurgical evaluation were unrevealing .   Labs revealed a vitamin D deficiency,  Normal TSH and CMET, CBC and neurosurgery consult  For back pain was without any results.  She was referred to me for a sleep consultation in 2009. The patient was diagnosed with sleep apnea on 03-07-08 with an AHI of 24.3 and in REM AHI of 82. Her BMI at the time was 37.9 , she was titrated to 10 cm water pressure on CPAP  which relieved her AHI but she still snored.   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 She has compliantly  used a  Original machine,  New the CPAP  machine seems to have failed. I am unable to obtain any data. In addition the patient had remained excessively fatigued and daytime sleepy. Possibly , this could have been a paraneoplastic manifestation of ovarian malignancy.   She has been using Nuvigil  as tolerated which has  given her improvement in life quality and energy. The last CPAP  download was on 05-06-12 with the 95% percentile pressure of 12 cm water, the residual AHI of 0.5 and an average of 5.9 hours of night-user time for CPAP.   She uses Lunesta for insomnia, doubled the dose at the time of her tumour diagnosis.   She has endorsed the Epworth score at 12 and FSS at 43 points, the GDS at 1 points.   She has almost met her doughnut hole.   REVIEW OF SYSTEMS: Full 14 system review of systems performed and notable only for:     ALLERGIES: Allergies  Allergen Reactions  . Bee Venom Hives    Difficulty breathing, carries an EPI-pen  . Cortisone     Injected cortisone, "sick to my stomach, throwing up, hand swelling, and pain."    HOME MEDICATIONS: Outpatient Prescriptions Prior to Visit  Medication Sig Dispense Refill  . Calcium Citrate-Vitamin D (CALCIUM CITRATE + D PO) Take 1 capsule by mouth daily.     . Cholecalciferol (VITAMIN D3) 5000 UNITS CAPS Take 5,000 mg by mouth daily.     . CRESTOR 10 MG tablet     . EPINEPHrine (EPIPEN 2-PAK) 0.3 mg/0.3 mL DEVI Inject 0.3 mg into the muscle once. For bee stings    . fish oil-omega-3 fatty acids 1000 MG capsule Take 1 g by mouth 2 (two) times daily.    Marland Kitchen FLUoxetine (PROZAC) 10 MG capsule Take 30 mg by mouth every evening.    Marland Kitchen ibuprofen (ADVIL,MOTRIN) 200 MG tablet Take 200 mg by mouth every 6 (six) hours as needed for pain.    . Melatonin 10 MG TABS Take 10 mg by mouth at bedtime.    . Multiple Vitamin (MULTIVITAMIN WITH MINERALS) TABS Take 1 tablet by mouth daily.    Marland Kitchen OVER THE COUNTER MEDICATION Nystatin topical powder  100,000 units/ GM  Patient uses as needed    . triamterene-hydrochlorothiazide (MAXZIDE-25) 37.5-25 MG per tablet Take 1 tablet by mouth daily. 30 tablet 1  . Armodafinil (NUVIGIL) 250 MG tablet Take 1 tablet (250 mg total) by mouth daily. 30 tablet 5  . eszopiclone (LUNESTA) 2 MG TABS tablet Take 1 tablet (2 mg total) by mouth at bedtime as needed for sleep. Take immediately before bedtime 30 tablet 0   No facility-administered medications prior to visit.    PHYSICAL EXAM Filed Vitals:   06/14/14 1105  BP: 139/100  Pulse: 90  Temp: 98.1 F (36.7 C)  TempSrc: Oral  Height: 5' (1.524 m)  Weight: 172 lb (78.019 kg)   Body mass index is 33.59 kg/(m^2).  Physical exam:  General: The patient is awake, alert and appears not in acute distress. The patient is well groomed. Head: Normocephalic, atraumatic. Neck is supple. Mallampati  3, neck circumference:   Cardiovascular:  Regular rate and rhythm , without  murmurs or carotid bruit, and without distended neck veins. Respiratory: Lungs are clear to auscultation. Skin:  Without evidence of edema, or rash Trunk: BMI remains Elevated, this  patient has normal posture.  Neurologic exam : The patient is awake and alert, oriented to place and time.  Memory subjective  described as intact. There is a normal attention span & concentration ability. Speech is fluent without  dysarthria, dysphonia or aphasia. Mood and affect are appropriate.  Cranial nerves: Pupils are equal and briskly reactive to light. Funduscopic exam not done.   Extraocular movements  in vertical and horizontal planes intact and  without nystagmus. Visual fields by finger perimetry are intact. Hearing to finger rub intact.  Facial sensation intact to fine touch. Facial motor strength is symmetric and tongue and uvula move midline. Motor exam:  Normal tone, muscle bulk and symmetric strength in all extremities. Grip strength is improved.   Gait and station: Patient walks without assistive device, Strength within normal limits. Stance is stable and normal.  Steps are unfragmented.   Deep tendon reflexes: in the  upper and lower extremities are symmetric and intact.    ASSESSMENT: 68 y.o. year old female  has a past medical history of Pelvic mass in female; Constipation; Diarrhea; Hyperlipidemia; Burning mouth syndrome; Chronic fatigue; Fibromyalgia; Lumbar disc disease; PONV (postoperative nausea and vomiting); Hypertension; Shortness of breath; Sleep apnea; Pneumonia; GERD (gastroesophageal reflux disease); Yeast infection; and Ovarian cancer (2014). here with:  1) OSA : resolved - AHI 0.9.   2) Hypersomnia - Stable, Uses Nuvigil when she has to drive or make presentation, Refills sent. 3) Insomnia - Using Diphenhydramine 25 mg to initiate sleep, and Lunesta 2 mg to stay asleep. Lunesta refilled.  Follow up  with Dr. Brett Fairy in 6 months, call sooner as needed.  Meds ordered this encounter  Medications  . Armodafinil (NUVIGIL) 250 MG tablet    Sig: Take 1 tablet (250 mg total) by mouth daily.    Dispense:  30 tablet    Refill:  5    Order Specific Question:  Supervising Provider    Answer:  Andrey Spearman R [[id number]]  . eszopiclone (LUNESTA) 2 MG TABS tablet    Sig: Take 1 tablet (2 mg total) by mouth at bedtime as needed for sleep. Take immediately before bedtime    Dispense:  30 tablet    Refill:  5    Pharmacy Fax [phone]    Order Specific Question:  Supervising Provider    Answer:  Brett Fairy, CARMEN [[id number]]   Return in about 6 months (around 12/13/2014) for hypersomnia.  Rudi Rummage Dacota Devall, MSN, FNP-BC, A/GNP-C 06/14/2014, 12:17 PM [facility] [address], [place], [place] [address] [phone]  Note: This document was prepared with digital dictation and possible smart phrase technology. Any transcriptional errors that result from this process are unintentional.

## 2014-06-14 NOTE — Progress Notes (Signed)
I agree with the assessment and plan as directed by NP .The patient is known to me .   Emre Stock, MD  

## 2014-06-14 NOTE — Patient Instructions (Signed)
Continue Lunesta 2 mg and Nuvigil as needed.  Follow up with Dr. Brett Fairy in 6 months, sooner as needed.   Hypersomnia Hypersomnia usually brings recurrent episodes of excessive daytime sleepiness or prolonged nighttime sleep. It is different than feeling tired due to lack of or interrupted sleep at night. People with hypersomnia are compelled to nap repeatedly during the day. This is often at inappropriate times such as:  At work.  During a meal.  In conversation. These daytime naps usually provide no relief. This disorder typically affects adolescents and young adults. CAUSES  This condition may be caused by:  Another sleep disorder (such as narcolepsy or sleep apnea).  Dysfunction of the autonomic nervous system.  Drug or alcohol abuse.  A physical problem, such as:  A tumor.  Head trauma. This is damage caused by an accident.  Injury to the central nervous system.  Certain medications, or medicine withdrawal.  Medical conditions may contribute to the disorder, including:  Multiple sclerosis.  Depression.  Encephalitis.  Epilepsy.  Obesity.  Some people appear to have a genetic predisposition to this disorder. In others, there is no known cause. SYMPTOMS   Patients often have difficulty waking from a long sleep. They may feel dazed or confused.  Other symptoms may include:  Anxiety.  Increased irritation (inflammation).  Decreased energy.  Restlessness.  Slow thinking.  Slow speech.  Loss of appetite.  Hallucinations.  Memory difficulty.  Tremors, Tics.  Some patients lose the ability to function in family, social, occupational, or other settings. TREATMENT  Treatment is symptomatic in nature. Stimulants and other drugs may be used to treat this disorder. Changes in behavior may help. For example, avoid night work and social activities that delay bed time. Changes in diet may offer some relief. Patients should avoid alcohol and  caffeine. PROGNOSIS  The likely outcome (prognosis) for persons with hypersomnia depends on the cause of the disorder. The disorder itself is not life threatening. But it can have serious consequences. For example, automobile accidents can be caused by falling asleep while driving. The attacks usually continue indefinitely. Document Released: 07/17/2002 Document Revised: 10/19/2011 Document Reviewed: 06/20/2008 [facility] Patient Information 2015 [place], [place]. This information is not intended to replace advice given to you by your health care provider. Make sure you discuss any questions you have with your health care provider.

## 2014-06-20 NOTE — Telephone Encounter (Signed)
Patient never came to pick up Samples of Nuvigil and Lunesta.

## 2014-07-16 ENCOUNTER — Telehealth: Payer: Self-pay | Admitting: Oncology

## 2014-07-16 NOTE — Telephone Encounter (Signed)
s.w. pt and advised on Dec April d.t change.Marland KitchenMarland KitchenMarland KitchenMarland Kitchenpt ok and aware.Marland KitchenMarland Kitchen

## 2014-07-27 ENCOUNTER — Telehealth: Payer: Self-pay | Admitting: Oncology

## 2014-07-27 ENCOUNTER — Other Ambulatory Visit: Payer: Self-pay | Admitting: *Deleted

## 2014-07-27 DIAGNOSIS — C569 Malignant neoplasm of unspecified ovary: Secondary | ICD-10-CM

## 2014-07-27 NOTE — Telephone Encounter (Signed)
added appt per pof...done...per pof research will advise pt on appt

## 2014-07-30 ENCOUNTER — Encounter: Payer: Commercial Managed Care - HMO | Admitting: *Deleted

## 2014-07-30 ENCOUNTER — Encounter: Payer: Self-pay | Admitting: *Deleted

## 2014-07-30 ENCOUNTER — Ambulatory Visit (HOSPITAL_BASED_OUTPATIENT_CLINIC_OR_DEPARTMENT_OTHER): Payer: Commercial Managed Care - HMO

## 2014-07-30 VITALS — BP 153/86 | HR 66 | Temp 97.7°F

## 2014-07-30 DIAGNOSIS — Z452 Encounter for adjustment and management of vascular access device: Secondary | ICD-10-CM

## 2014-07-30 DIAGNOSIS — C5701 Malignant neoplasm of right fallopian tube: Secondary | ICD-10-CM

## 2014-07-30 MED ORDER — SODIUM CHLORIDE 0.9 % IJ SOLN
10.0000 mL | INTRAMUSCULAR | Status: DC | PRN
  Administered 2014-07-30: 10 mL via INTRAVENOUS
  Filled 2014-07-30: qty 10

## 2014-07-30 MED ORDER — HEPARIN SOD (PORK) LOCK FLUSH 100 UNIT/ML IV SOLN
500.0000 [IU] | Freq: Once | INTRAVENOUS | Status: AC
  Administered 2014-07-30: 500 [IU] via INTRAVENOUS
  Filled 2014-07-30: qty 5

## 2014-07-30 NOTE — Patient Instructions (Signed)

## 2014-08-06 ENCOUNTER — Other Ambulatory Visit: Payer: Commercial Managed Care - HMO

## 2014-09-06 ENCOUNTER — Other Ambulatory Visit: Payer: Commercial Managed Care - HMO

## 2014-09-06 ENCOUNTER — Ambulatory Visit: Payer: Commercial Managed Care - HMO | Attending: Gynecologic Oncology | Admitting: Gynecologic Oncology

## 2014-09-06 ENCOUNTER — Ambulatory Visit (HOSPITAL_BASED_OUTPATIENT_CLINIC_OR_DEPARTMENT_OTHER): Payer: Commercial Managed Care - HMO

## 2014-09-06 ENCOUNTER — Encounter: Payer: Self-pay | Admitting: Gynecologic Oncology

## 2014-09-06 ENCOUNTER — Telehealth: Payer: Self-pay

## 2014-09-06 ENCOUNTER — Encounter: Payer: Self-pay | Admitting: *Deleted

## 2014-09-06 VITALS — BP 137/81 | HR 70 | Temp 98.0°F | Resp 18 | Ht 60.0 in | Wt 183.1 lb

## 2014-09-06 DIAGNOSIS — Z452 Encounter for adjustment and management of vascular access device: Secondary | ICD-10-CM

## 2014-09-06 DIAGNOSIS — C5700 Malignant neoplasm of unspecified fallopian tube: Secondary | ICD-10-CM | POA: Diagnosis not present

## 2014-09-06 DIAGNOSIS — C5701 Malignant neoplasm of right fallopian tube: Secondary | ICD-10-CM

## 2014-09-06 DIAGNOSIS — C569 Malignant neoplasm of unspecified ovary: Secondary | ICD-10-CM

## 2014-09-06 MED ORDER — HEPARIN SOD (PORK) LOCK FLUSH 100 UNIT/ML IV SOLN
500.0000 [IU] | Freq: Once | INTRAVENOUS | Status: AC
  Administered 2014-09-06: 500 [IU] via INTRAVENOUS
  Filled 2014-09-06: qty 5

## 2014-09-06 MED ORDER — SODIUM CHLORIDE 0.9 % IJ SOLN
10.0000 mL | INTRAMUSCULAR | Status: DC | PRN
  Administered 2014-09-06: 10 mL via INTRAVENOUS
  Filled 2014-09-06: qty 10

## 2014-09-06 NOTE — Patient Instructions (Signed)
We would like to see you in our gyn clinic in July. Please call in May to schedule your appointment.

## 2014-09-06 NOTE — Progress Notes (Signed)
Consult Note: Gyn-Onc  Kristin Webb 69 y.o. female  CC:  Chief Complaint  Patient presents with  . Fallopian Tube Carcinoma    HPI: Kristin Webb is a 69 year old, gravida 2 para 2, who noticed increasing abdominal swelling around the third week of January. Initially, the abdominal swelling was intermittent and would come and go. She related this to recent initiation of a calcium supplement. The abdominal swelling became more prevalent starting in February with abdominal pain reported also. In addition, she started having increasing constipation and formed stools, which was a change in her routine of 5-6 loose bowel movements a day chronically after her cholecystectomy. On November 01, 2012, she had a CT scan of the abdomen and pelvis which resulted no significant biliary dilation, no suspicious liver lesions, and the spleen, adrenal glands and pancreas appeared normal. 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 She has a large midabdominal mass measuring 17 x 22.7 cm x 20 cm cephalad, which was well circumscribed without calcifications. There is irregular thickened septations and areas of solid nodularity consistent with malignancy. There is no bowel obstruction and the mass appeared to be separate from the appendix. There was probable normal left ovarian tissue seen. The uterus is surgically absent. There is some soft tissue stranding at the base of the mesentery superior to the mass and some omental nodularity.    On November 08, 2012, she underwent an exploratory laparotomy, BSO, appendectomy, infracolic omentectomy, and optimal debulking. Operative findings included: 25 cm right adnexal mass with smooth surface. Surgically absent uterus. Atrophic-appearing left ovary. Normal appearing appendix. Within the omentum there were centimeter nodules scattered throughout the omentum. The remainder of the surfaces were benign. Final  pathology revealed:  1. 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 - BENIGN FALLOPIAN TUBE WITH NONSPECIFIC CHRONIC INFLAMMATION. 2. Ovary and fallopian tube, left - BENIGN OVARY; NEGATIVE FOR ATYPIA OR MALIGNANCY. - BENIGN FALLOPIAN TUBE; NEGATIVE FOR ATYPIA OR MALIGNANCY. 3. Omentum, resection for tumor - HIGH GRADE CARCINOMA, SEE COMMENT. 4. Appendix, Other than Incidental - FIBROUS OBLITERATION OF APPENDICEAL TIP. - NEGATIVE FOR MALIGNANCY.  She completed day 1, cycle 6 of 6 planned treatments of carboplatin and taxol on 03/28/13.   9/14 CT Findings: The liver is diffusely fatty infiltrated and measures 18.7 cm in cranial caudal length. No focal intrahepatic parenchymal abnormality. The spleen is unremarkable. The stomach, duodenum, pancreas and adrenal glands are unremarkable. The gallbladder is surgically absent. 11 x 9 x 11 mm nonobstructing stone is identified in the interpolar right kidney. Stable appearance of a 9 mm fatty lesion in the right kidney, likely a tiny angiomyolipoma. No abdominal aortic aneurysm. No free fluid in the abdomen. No abdominal lymphadenopathy. Haziness in the root of the small bowel mesentery is stable. Approximately 8 cm cranial to the umbilicus is an area of apparent focal midline fascial laxity. I cannot identify a discrete fascial defect to suggest an overt hernia. This is a wide-mouthed fascial bulge and some of the transverse colon projects out into this area of fascial bulging. No bowel wall thickening, adjacent edema, orfluid to suggest complication. Imaging through the pelvis shows no free intraperitoneal fluid. No pelvic sidewall lymphadenopathy. The uterus is surgically absent. The large complex cystic lesion seen in the right central abdomen and  pelvis has been resected in the interval. No substantial diverticular  disease in the colon. There is no colonic diverticulitis. Terminal ileum is normal. The appendix is not visualized, but there is no edema or inflammation in the region of the cecum. The right gonadal vein is enlarged and contains a central filling defect consistent with thrombus. Bone windows reveal no worrisome lytic or sclerotic osseous lesions.  IMPRESSION:  Interval resection of the large right pelvic and lower abdominal mass lesion with apparent omentectomy. No evidence for intraperitoneal free fluid on today's study. No discernible peritoneal lesions. 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  Interval History:  She was last seen by GYN oncology in 7/15. At that time her exam was negative. She was last seen by Dr. Marko Plume in October 2015. Exam at that time was similarly unremarkable. Her CA 125 was 5 when last drawn. She comes in today for followup. She had a colonoscopy about a year ago. She is on GOG 225. She has gained a bit of the weight back. Based on our scale she's gained about 7 pounds. She states that the water aerobics was aggravating her back so she went to swimming which she did not do over the holidays. Additionally, she had been up to 6000 steps per day but over the holidays that diminished and she is back up to that per day but just recently. She had a cold over the holidays that did cause her to decrease the amount of exercise she was doing. She occasionally has a rare episode of abdominal pain across the abdomen. In the morning she has some pain across her forehead that she believes is related to sinuses. As she gets up and around the pain goes away. She is overdue for her mammogram. She has not had one since 2014.  Review of Systems:  Constitutional:  She denies any fevers Skin: + Rash consistent with Candida Cardiovascular: No chest pain,  No SOB Pulmonary: No cough Gastro Intestinal: No nausea, vomiting, constipation, or diarrhea reported. No bright red blood per rectum or  change in bowel movement.  Genitourinary: Denies vaginal bleeding and discharge.  Musculoskeletal: She has some back pain. Neurologic: No weakness, numbness, or change in gait.  Psychology: No changes   Current Meds:  Outpatient Encounter Prescriptions as of 09/06/2014  Medication Sig  . Armodafinil (NUVIGIL) 250 MG tablet Take 1 tablet (250 mg total) by mouth daily.  . Calcium Citrate-Vitamin D (CALCIUM CITRATE + D PO) Take 1 capsule by mouth daily.   . Cholecalciferol (VITAMIN D3) 5000 UNITS CAPS Take 5,000 mg by mouth daily.   . CRESTOR 10 MG tablet   . EPINEPHrine (EPIPEN 2-PAK) 0.3 mg/0.3 mL DEVI Inject 0.3 mg into the muscle once. For bee stings  . eszopiclone (LUNESTA) 2 MG TABS tablet Take 1 tablet (2 mg total) by mouth at bedtime as needed for sleep. Take immediately before bedtime  . fish oil-omega-3 fatty acids 1000 MG capsule Take 1 g by mouth 2 (two) times daily.  Marland Kitchen FLUoxetine (PROZAC) 10 MG capsule Take 30 mg by mouth every evening.  Marland Kitchen ibuprofen (ADVIL,MOTRIN) 200 MG tablet Take 200 mg by mouth every 6 (six) hours as needed for pain.  . Multiple Vitamin (MULTIVITAMIN WITH MINERALS) TABS Take 1 tablet by mouth daily.  Marland Kitchen OVER THE COUNTER MEDICATION Nystatin topical powder  100,000 units/ GM  Patient uses as needed  . triamterene-hydrochlorothiazide (MAXZIDE-25) 37.5-25 MG per tablet Take 1 tablet by mouth daily.  Marland Kitchen FLUoxetine (PROZAC)  20 MG capsule   . Melatonin 10 MG TABS Take 10 mg by mouth at bedtime.  . [DISCONTINUED] sodium chloride 0.9 % injection 10 mL     Allergy:  Allergies  Allergen Reactions  . Bee Venom Hives    Difficulty breathing, carries an EPI-pen  . Cortisone     Injected cortisone, "sick to my stomach, throwing up, hand swelling, and pain."    Social Hx:   History   Social History  . Marital Status: Married    Spouse Name: [name]    Number of Children: 2  . Years of Education: Master's   Occupational History  . Not on file.   Social  History Main Topics  . Smoking status: Never Smoker   . Smokeless tobacco: Never Used  . Alcohol Use: No  . Drug Use: No  . Sexual Activity: Not on file   Other Topics Concern  . Not on file   Social History Narrative   Patient is married [name]) and lives at home with her husband.   Patient has 2 children by birth and 13 children all together.   Patient has a Oceanographer.   Patient is right-handed.   Patient drinks very little caffeine.             Past Surgical Hx:  Past Surgical History  Procedure Laterality Date  . Cholecystectomy      early 16s  . Abdominal hysterectomy      early 1990s  . Trigger finger release    . Laparotomy Bilateral 11/08/2012    Procedure: EXPLORATORY LAPAROTOMY TOTAL ABDOMINAL HYSTERECTOMY BILATERAL SALPINGO OOPHORECTOMY TUMOR DEBULKING ;  Surgeon: Imagene Gurney A. Alycia Rossetti, MD;  Location: [facility];  Service: Gynecology;  Laterality: Bilateral;  APPENDECTOMY / OMENTECTOMY  . Appendectomy    . Exploratory laparotomy  11/08/12    BSO, appendectomy, omentectomy    Past Medical Hx:  Past Medical History  Diagnosis Date  . Pelvic mass in female   . Constipation   . Diarrhea     in the past after gallbladder removal  . Hyperlipidemia   . Burning mouth syndrome   . Chronic fatigue   . Fibromyalgia   . Lumbar disc disease   . PONV (postoperative nausea and vomiting)   . Hypertension     borderline not on meds   . Shortness of breath     with exertion   . Sleep apnea     CPAP settings at 13   . Pneumonia     hx of pneumonia   . GERD (gastroesophageal reflux disease)   . Yeast infection   . Ovarian cancer 2014    Oncology Hx:  Oncology History   IIIB serous carcinoma of right fallopian tube, treated with optimal debulking 11-08-2012 and adjuvant chemotherapy     Ovarian ca   11/01/2012 Initial Diagnosis Ovarian ca   11/08/2012 Surgery IA mucious LMP, IIIB serous FT carcinoma    - 03/28/2013 Chemotherapy Completed cycle #6 of paclitaxel and  carboplatin. negatve post treatmen CT 9/14    Fallopian tube carcinoma   11/01/2012 Initial Diagnosis Fallopian tube carcinoma   11/08/2012 Surgery Debulking, optima. IA ovarian and IIIB fallopian tube   12/13/2012 - 03/28/2013 Chemotherapy s/p 6 cycles of paclitaxel and carboplatin    Family Hx:  Family History  Problem Relation Age of Onset  . Lung cancer Mother 109  . Lung cancer Father 10    2 ppd smoker  . High blood pressure Mother   .  High Cholesterol Mother   . Parkinson's disease Father   . Kidney disease      Vitals:  Blood pressure 137/81, pulse 70, temperature 98 F (36.7 C), temperature source Oral, resp. rate 18, height 5' (1.524 m), weight 183 lb 1.6 oz (83.054 kg), SpO2 100 %.  Physical Exam: General: Well developed, well nourished female in no acute distress. Alert and oriented x 3.   Head/Neck:  Supple without any enlargements.   Lymph node survey: No cervical, supraclavicular, or inguinal adenopathy.   Cardiovascular: Regular rate and rhythm.   Lungs: Clear to auscultation bilaterally.   Breasts: Some candida under the bilateral breasts. There is no palpable masses, no skin changes, no nipple discharge. There is no axillary adenopathy. Exam is limited as the breasts are large and pendulous.  Abdomen: Abdomen soft, non-tender and obese. Active bowel sounds in all quadrants. No evidence of a fluid wave. Midline abdominal incision well healed. Questionable hernia versus laxity of the abdominal wall.  Genitourinary:  Vulva/vagina: Normal external female genitalia. No lesions.  Urethra: No lesions or masses.  Vagina: No masses or nodularity Rectal: Good tone, no palpable masses or nodularity.   Extremities: No bilateral cyanosis, edema, or clubbing.     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 She was completely resected to an R0. She's  completed her chemotherapy and had a post treatment CT scan that was negative in 9/14. Her last CA 125 was normal. It has never been elevated.  Per protocol she will follow up with Dr. Marko Plume in 4/16 and return to see [place] per protocol approximately 3 months after that. She had a CA 125 today which is not back yet. She was encouraged to continue her exercise and weight loss. She will call to schedule her MMG.   Fortunata Betty A., MD 09/06/2014, 10:40 AM

## 2014-09-06 NOTE — Progress Notes (Signed)
09/06/14 at 10:47am - GOG 225, month 15 on-study visit- Kristin Webb into the [facility] for labs and to see [name].  After lab work was completed, pt's weight and height was obtained without shoes. Her waist measurement was taken at the umbilicus with the Gulick II tape measure per protocol and device instructions. Her waist measurement was 107.0 cm.    Kristin Webb continues to experience insomnia, fatigue, and low back pain. She reports that she no longer has an UTI infection. The pt has a reported weight gain of 11 lbs since her last visit.  Therefore, her AE of weight loss will be resolved.  The pt's CA.125 result is pending.    09/07/14 at 10:51am - The pt's CA.125 is within normal limits.  Dr. Mariana Kaufman nurse will call the pt with the results today.  The research nurse reviewed the pt's MD note for new AE's to report.  Dr. Elenora Gamma note from 09/06/14  reports "no numbness" in her neurologic review of system, and "no SOB" in her cardiovascular review of system.  Therefore, the research will resolve these AE's (neuropathy and dyspnea) as "recovered".  Dr. Elenora Gamma noted stated the pt has 2 hew AE's to report.  The pt complained of rare abdominal pain and pain in her forehead felt to be related to her sinuses.  The research nurse will have Dr. Marko Plume provide attribution on these new AE's next week.  09/10/14 at 9:49am - The research nurse met with Dr. Marko Plume this am to discuss the pt's visit and her AE's from last week.  Dr. Marko Plume confirmed that the pt's insomnia, back pain, and fatigue are chronic conditions for the pt, and these AE's should remain open.  The research nurse stated that the pt said her UTI infections have resolved since she is drinking cranberry juice daily.  Dr. Marko Plume reviewed Dr. Elenora Gamma notes, and she was in agreement to resolve/close the pt's neuropathy and dyspnea AE's based on Dr. Elenora Gamma assessment.  Dr. Marko Plume was in agreement to add the abdominal pain and the sinus pain  as new AE's.  Dr. Marko Plume said that both of these AE's are "unrelated" to her diet/exercise study intervention.  Dr. Marko Plume said that her "rare abdominal pain" was probably related to the pt's large hernia.  She said her "forehead pain" was probably related to a sinus condition.  Dr. Marko Plume signed and dated the Follow-Up AE form for study purposes.  Dr. Marko Plume is scheduled to see the pt for her month 18 visit in April 2016.

## 2014-09-06 NOTE — Telephone Encounter (Signed)
Gave Kristin Webb her new appointment for her PAC flush for 10-22-14 as 09-24-14 was too soon as she had it flushed today 09-06-14. Ms. [name] verbalized understanding.

## 2014-09-07 ENCOUNTER — Other Ambulatory Visit: Payer: Self-pay | Admitting: *Deleted

## 2014-09-07 ENCOUNTER — Telehealth: Payer: Self-pay

## 2014-09-07 DIAGNOSIS — C569 Malignant neoplasm of unspecified ovary: Secondary | ICD-10-CM

## 2014-09-07 LAB — CA 125(PREVIOUS METHOD): CA 125: 4.6 U/mL (ref 0.0–30.2)

## 2014-09-07 LAB — CA 125: CA 125: 6 U/mL (ref <35)

## 2014-09-07 NOTE — Telephone Encounter (Signed)
-----   Message from Gordy Levan, MD sent at 09/07/2014 10:13 AM EST ----- Labs seen and need follow up: please let her know CA 125 good at 6 on 09-06-14

## 2014-09-07 NOTE — Telephone Encounter (Signed)
Left a message for Ms. Hett to call back for lab results from 09-06-14 as noted below by Dr. Marko Plume.

## 2014-09-10 NOTE — Telephone Encounter (Signed)
Called patient with results of CA125 as noted below by Dr. Marko Plume. Pt appreciative of call.

## 2014-09-28 DIAGNOSIS — N390 Urinary tract infection, site not specified: Secondary | ICD-10-CM | POA: Diagnosis not present

## 2014-09-28 DIAGNOSIS — R300 Dysuria: Secondary | ICD-10-CM | POA: Diagnosis not present

## 2014-10-18 ENCOUNTER — Telehealth: Payer: Self-pay | Admitting: Oncology

## 2014-10-18 NOTE — Telephone Encounter (Signed)
PT CALLED AND R/S 3/14 FLUSH APPT TO 3/23. PT HAS NEW D/T.

## 2014-10-31 ENCOUNTER — Ambulatory Visit (HOSPITAL_BASED_OUTPATIENT_CLINIC_OR_DEPARTMENT_OTHER): Payer: Commercial Managed Care - HMO

## 2014-10-31 VITALS — BP 148/106 | HR 102 | Temp 98.6°F | Resp 18

## 2014-10-31 DIAGNOSIS — Z452 Encounter for adjustment and management of vascular access device: Secondary | ICD-10-CM

## 2014-10-31 DIAGNOSIS — Z95828 Presence of other vascular implants and grafts: Secondary | ICD-10-CM

## 2014-10-31 DIAGNOSIS — C5701 Malignant neoplasm of right fallopian tube: Secondary | ICD-10-CM | POA: Diagnosis not present

## 2014-10-31 MED ORDER — SODIUM CHLORIDE 0.9 % IJ SOLN
10.0000 mL | INTRAMUSCULAR | Status: DC | PRN
  Administered 2014-10-31: 10 mL via INTRAVENOUS
  Filled 2014-10-31: qty 10

## 2014-10-31 MED ORDER — HEPARIN SOD (PORK) LOCK FLUSH 100 UNIT/ML IV SOLN
500.0000 [IU] | Freq: Once | INTRAVENOUS | Status: AC
  Administered 2014-10-31: 500 [IU] via INTRAVENOUS
  Filled 2014-10-31: qty 5

## 2014-10-31 NOTE — Patient Instructions (Signed)

## 2014-11-23 ENCOUNTER — Other Ambulatory Visit: Payer: Self-pay | Admitting: Oncology

## 2014-11-23 ENCOUNTER — Encounter: Payer: Self-pay | Admitting: Oncology

## 2014-11-23 NOTE — Progress Notes (Signed)
Medical Oncology  Scheduler called to move upcoming appointment, and learned that patient's husband died unexpectedly last week. Appointment rescheduled to early May. Sympathy card written.  Godfrey Pick, MD

## 2014-11-29 ENCOUNTER — Other Ambulatory Visit: Payer: Self-pay | Admitting: *Deleted

## 2014-12-05 ENCOUNTER — Ambulatory Visit: Payer: Commercial Managed Care - HMO | Admitting: Oncology

## 2014-12-06 ENCOUNTER — Ambulatory Visit: Payer: Commercial Managed Care - HMO | Admitting: Oncology

## 2014-12-06 ENCOUNTER — Other Ambulatory Visit: Payer: Commercial Managed Care - HMO

## 2014-12-14 ENCOUNTER — Ambulatory Visit: Payer: Commercial Managed Care - HMO | Admitting: Neurology

## 2014-12-16 ENCOUNTER — Other Ambulatory Visit: Payer: Self-pay | Admitting: Oncology

## 2014-12-16 DIAGNOSIS — C5700 Malignant neoplasm of unspecified fallopian tube: Secondary | ICD-10-CM

## 2014-12-18 ENCOUNTER — Ambulatory Visit: Payer: Commercial Managed Care - HMO

## 2014-12-18 ENCOUNTER — Encounter: Payer: Self-pay | Admitting: *Deleted

## 2014-12-18 ENCOUNTER — Ambulatory Visit (HOSPITAL_BASED_OUTPATIENT_CLINIC_OR_DEPARTMENT_OTHER): Payer: Commercial Managed Care - HMO | Admitting: Oncology

## 2014-12-18 ENCOUNTER — Other Ambulatory Visit: Payer: Self-pay | Admitting: *Deleted

## 2014-12-18 ENCOUNTER — Telehealth: Payer: Self-pay | Admitting: Oncology

## 2014-12-18 ENCOUNTER — Other Ambulatory Visit (HOSPITAL_BASED_OUTPATIENT_CLINIC_OR_DEPARTMENT_OTHER): Payer: Commercial Managed Care - HMO

## 2014-12-18 ENCOUNTER — Encounter: Payer: Self-pay | Admitting: Oncology

## 2014-12-18 VITALS — BP 138/87 | HR 87 | Temp 98.0°F | Resp 18 | Ht 60.0 in | Wt 180.2 lb

## 2014-12-18 DIAGNOSIS — C5700 Malignant neoplasm of unspecified fallopian tube: Secondary | ICD-10-CM

## 2014-12-18 DIAGNOSIS — Z95828 Presence of other vascular implants and grafts: Secondary | ICD-10-CM

## 2014-12-18 DIAGNOSIS — C5701 Malignant neoplasm of right fallopian tube: Secondary | ICD-10-CM

## 2014-12-18 DIAGNOSIS — R110 Nausea: Secondary | ICD-10-CM | POA: Diagnosis not present

## 2014-12-18 DIAGNOSIS — C569 Malignant neoplasm of unspecified ovary: Secondary | ICD-10-CM

## 2014-12-18 DIAGNOSIS — Z8543 Personal history of malignant neoplasm of ovary: Secondary | ICD-10-CM | POA: Diagnosis not present

## 2014-12-18 DIAGNOSIS — K2970 Gastritis, unspecified, without bleeding: Secondary | ICD-10-CM | POA: Diagnosis not present

## 2014-12-18 DIAGNOSIS — E669 Obesity, unspecified: Secondary | ICD-10-CM

## 2014-12-18 LAB — COMPREHENSIVE METABOLIC PANEL (CC13)
ALT: 18 U/L (ref 0–55)
AST: 17 U/L (ref 5–34)
Albumin: 4.1 g/dL (ref 3.5–5.0)
Alkaline Phosphatase: 79 U/L (ref 40–150)
Anion Gap: 13 mEq/L — ABNORMAL HIGH (ref 3–11)
BUN: 13.5 mg/dL (ref 7.0–26.0)
CO2: 26 mEq/L (ref 22–29)
Calcium: 9.1 mg/dL (ref 8.4–10.4)
Chloride: 104 mEq/L (ref 98–109)
Creatinine: 0.7 mg/dL (ref 0.6–1.1)
EGFR: 88 mL/min/{1.73_m2} — ABNORMAL LOW (ref >90)
Glucose: 88 mg/dl (ref 70–140)
Potassium: 3.5 mEq/L (ref 3.5–5.1)
Sodium: 143 mEq/L (ref 136–145)
Total Bilirubin: 0.43 mg/dL (ref 0.20–1.20)
Total Protein: 6.8 g/dL (ref 6.4–8.3)

## 2014-12-18 LAB — CBC WITH DIFFERENTIAL/PLATELET
BASO%: 0.8 % (ref 0.0–2.0)
Basophils Absolute: 0 10*3/uL (ref 0.0–0.1)
EOS%: 1.2 % (ref 0.0–7.0)
Eosinophils Absolute: 0.1 10*3/uL (ref 0.0–0.5)
HCT: 43.7 % (ref 34.8–46.6)
HGB: 14.4 g/dL (ref 11.6–15.9)
LYMPH%: 28.5 % (ref 14.0–49.7)
MCH: 28.6 pg (ref 25.1–34.0)
MCHC: 32.9 g/dL (ref 31.5–36.0)
MCV: 86.9 fL (ref 79.5–101.0)
MONO#: 0.6 10*3/uL (ref 0.1–0.9)
MONO%: 9.1 % (ref 0.0–14.0)
NEUT#: 3.9 10*3/uL (ref 1.5–6.5)
NEUT%: 60.4 % (ref 38.4–76.8)
Platelets: 158 10*3/uL (ref 145–400)
RBC: 5.03 10*6/uL (ref 3.70–5.45)
RDW: 14.1 % (ref 11.2–14.5)
WBC: 6.4 10*3/uL (ref 3.9–10.3)
lymph#: 1.8 10*3/uL (ref 0.9–3.3)

## 2014-12-18 MED ORDER — HEPARIN SOD (PORK) LOCK FLUSH 100 UNIT/ML IV SOLN
500.0000 [IU] | Freq: Once | INTRAVENOUS | Status: AC
  Administered 2014-12-18: 500 [IU] via INTRAVENOUS
  Filled 2014-12-18: qty 5

## 2014-12-18 MED ORDER — PANTOPRAZOLE SODIUM 40 MG PO TBEC: 40.0000 mg | DELAYED_RELEASE_TABLET | Freq: Every day | ORAL | Status: DC

## 2014-12-18 MED ORDER — SODIUM CHLORIDE 0.9 % IJ SOLN
10.0000 mL | INTRAMUSCULAR | Status: DC | PRN
  Administered 2014-12-18: 10 mL via INTRAVENOUS
  Filled 2014-12-18: qty 10

## 2014-12-18 NOTE — Progress Notes (Signed)
12/18/14 at 4:16pm - GOG 225 - month 18 on-study visit - The pt was into the cancer center this afternoon for her month 18 on-study visit.  The pt was given emotional support because of her husband's recent death.  The pt's port was accessed for her labs including her CA.125.  The pt weight was obtained shoeless.  The pt said that she is unfortunately "eating out a lot" since her husband's sudden passing.  She said her appetite is decreased, but she is still trying to eat normally.  The pt's waist measurement was obtained using the Gulick II tape measure per protocol (108 cm) .  The pt's waist measurement may not be accurate due to the pt's large ventral hernia. The pt's concomitant medications were reviewed with the pt.  The pt was seen and examined by Dr. Marko Plume.  The pt's next appt will be in July for her month 21 visit.    12/21/14 at 11:00am- The research nurse called the pt to confirm her August appointments (month 21 on-study visit).  The pt confirmed the following AE's as ongoing:  insomnia ( taking Lunesta), fatigue  chronic condition, (uses Nuvigil as needed), and back pain, chronic.  The pt specifically denied any sinus problems.  The pt was thanked for her continued support of this trial.

## 2014-12-18 NOTE — Patient Instructions (Signed)

## 2014-12-18 NOTE — Telephone Encounter (Signed)
Gave patient avs report and appointments for July thru October. Left message with gyn-onc requesting that patient be contacted with appointment to see [name] in August. Patient also given number for gyn-onc to follow up on appointment.

## 2014-12-18 NOTE — Progress Notes (Signed)
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 Last imaging was CT AP 04-2013. She saw Dr Alycia Rossetti in 08-2014 and will see her again ~ 3 months from this visit. She continues on diet and exercise study GOG 0225, presently month 18 and will complete the 2 year study in 05-2015.  This visit was delayed in part due to unexpected death of her husband in early 13-Dec-2022, apparently after lung biopsy that documented lung cancer. They had been married >40 years, and had known each other >50 years. They had 2 children of their own and adopted 11 children.  Peripheral IV access is very difficult and she still has PAC in, flushed at this office every 8 weeks.  Patient has lots of support, but continues to have a difficult time with loss of her husband. She has had "low grade nausea" since this happened, which she had not had previously. Bowels are moving well 1-2x daily and she denies GERD. She has no UTI symptoms. She has no abdominal or pelvic pain, no LE swelling. No bleeding.    She has PAC, flushed today.  She has had flu vaccine. Genetics testing negative spring 2015. 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 negatve post treatmen CT 9/14    Fallopian tube carcinoma   11/01/2012 Initial Diagnosis  Fallopian tube carcinoma   11/08/2012 Surgery Debulking, optima. IA ovarian and IIIB fallopian tube   12/13/2012 - 03/28/2013 Chemotherapy s/p 6 cycles of paclitaxel and carboplatin  Patient presented in ~ Jan 2014 with complaints of abdominal distension and change in bowels. She had had unremarkable colonoscopy July 2013 (not in this EMR, and patient does not recall which MD). She is post hysterectomy in 1990s. Abdominal symptoms did not improve with interventions for constipation, and CT AP was done in [facility] [place] system 11-01-12 with finding of large mid abdominal mass. She was referred to Dr Alycia Rossetti. Preop CA 125 is not available in this EMR; preop CXR had some linear scarring in lingula without acute findings. She went to exploratory laparotomy, bilateral salpingo-oophorectomy, appendectomy, and infacolic omentectomy, which was optimal debulking, by Dr Alycia Rossetti on 11-08-2012. Operative findings: 25 cm right adnexal mass with smooth surface. Surgically absent uterus. Atrophic-appearing left ovary. Within the omentum there were centimeter nodules scattered throughout. The remainder of the surfaces were benign. 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 She had PAC placed by IR and 6 cycles of taxol carbo from 12-13-12 thru 03-28-13, with neulasta support. She had negative BRCA and breast/ovarian cancer syndrome genetic screening in spring 2015  Review of systems as above, also: No fever or symptoms of infection. No problems with PAC. Uses CPAP at hs. Remainder of 10 point Review of Systems negative.  Objective:  Vital signs in last 24  hours:  BP 138/87 mmHg  Pulse 87  Temp(Src) 98 F (36.7 C) (Oral)  Resp 18  Ht 5' (1.524 m)  Wt 180 lb 3.2 oz (81.738 kg)  BMI 35.19 kg/m2 Weight up 1 lb Alert, oriented and appropriate. Ambulatory without assistance .  No alopecia  HEENT:PERRL, sclerae not icteric. Oral mucosa moist without  lesions, posterior pharynx clear.  Neck supple. No JVD.  Lymphatics:no cervical,supraclavicular, axillary or inguinal adenopathy Resp: clear to auscultation bilaterally and normal percussion bilaterally Cardio: regular rate and rhythm. No gallop. GI: soft, mildly tender at epigastrium,large ventral fascial defect not tender, no mass or organomegaly. Normally active bowel sounds. Surgical incision not remarkable otherwise Musculoskeletal/ Extremities: without pitting edema, cords, tenderness PSYCH slightly tearful talking about husband, appropriate mood and affect Skin without rash, ecchymosis, petechiae Breasts: without dominant mass, skin or nipple findings. Axillae benign. Portacath-without erythema or tenderness  Lab Results:  Results for orders placed or performed in visit on 12/18/14  CBC with Differential  Result Value Ref Range   WBC 6.4 3.9 - 10.3 10e3/uL   NEUT# 3.9 1.5 - 6.5 10e3/uL   HGB 14.4 11.6 - 15.9 g/dL   HCT 43.7 34.8 - 46.6 %   Platelets 158 145 - 400 10e3/uL   MCV 86.9 79.5 - 101.0 fL   MCH 28.6 25.1 - 34.0 pg   MCHC 32.9 31.5 - 36.0 g/dL   RBC 5.03 3.70 - 5.45 10e6/uL   RDW 14.1 11.2 - 14.5 %   lymph# 1.8 0.9 - 3.3 10e3/uL   MONO# 0.6 0.1 - 0.9 10e3/uL   Eosinophils Absolute 0.1 0.0 - 0.5 10e3/uL   Basophils Absolute 0.0 0.0 - 0.1 10e3/uL   NEUT% 60.4 38.4 - 76.8 %   LYMPH% 28.5 14.0 - 49.7 %   MONO% 9.1 0.0 - 14.0 %   EOS% 1.2 0.0 - 7.0 %   BASO% 0.8 0.0 - 2.0 %  Comprehensive metabolic panel (Cmet) - [facility]  Result Value Ref Range   Sodium 143 136 - 145 mEq/L   Potassium 3.5 3.5 - 5.1 mEq/L   Chloride 104 98 - 109 mEq/L   CO2 26 22 - 29 mEq/L   Glucose 88 70 - 140 mg/dl   BUN 13.5 7.0 - 26.0 mg/dL   Creatinine 0.7 0.6 - 1.1 mg/dL   Total Bilirubin 0.43 0.20 - 1.20 mg/dL   Alkaline Phosphatase 79 40 - 150 U/L   AST 17 5 - 34 U/L   ALT 18 0 - 55 U/L   Total Protein 6.8 6.4 - 8.3 g/dL   Albumin 4.1 3.5 - 5.0 g/dL   Calcium 9.1 8.4 - 10.4 mg/dL    Anion Gap 13 (H) 3 - 11 mEq/L   EGFR 88 (L) >90 ml/min/1.73 m2   CA 125 available after visit   5  Studies/Results:  No results found.  Medications: I have reviewed the patient's current medications. Add protonix due to low grade nausea and mild tenderness at epigastrium. Able to sleep with lunesta  DISCUSSION: NOTE waist measurement for GOG 0225 may not be accurate due to large ventral fascial defect.   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 Continuing GOG 225 diet and exercise study planned x 2 years, thru Oct 2016. 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  4.PAC in: needs flush every 6-8 weeks.  5. lumbar disc disease symptomatic with chronic LLE pain: Dr Ashok Pall has seen previously 6.elevated BP: PCP managing 7.obesity: BMI down from 38.7 to 35.6. GOG 225 diet and exercise study.  8.Narcolepsy- type disorder, fibromyalgia. On Nuvigil per Dr Brett Fairy. Sleep apnea has improved such that she no longer needs CPAP 9.negative BRCA and breast/ovarian cancer syndrome testing spring 2015 10.recent loss of her husband, grieving. 11.nausea and epigastric tenderness: will add Protonix 40 mg daily. Patient should let MD know if symptoms do not improve.  All questions answered. Discussed with Electrical engineer. Time spent 20 min including >50% counseling and coordination of care. Cc Dr Earlie Counts, MD   12/18/2014, 3:02 PM

## 2014-12-19 LAB — CA 125: CA 125: 5 U/mL (ref <35)

## 2014-12-20 ENCOUNTER — Telehealth: Payer: Self-pay | Admitting: *Deleted

## 2014-12-20 DIAGNOSIS — K2970 Gastritis, unspecified, without bleeding: Secondary | ICD-10-CM | POA: Insufficient documentation

## 2014-12-20 DIAGNOSIS — C569 Malignant neoplasm of unspecified ovary: Secondary | ICD-10-CM | POA: Insufficient documentation

## 2014-12-20 DIAGNOSIS — Z95828 Presence of other vascular implants and grafts: Secondary | ICD-10-CM | POA: Insufficient documentation

## 2014-12-20 NOTE — Telephone Encounter (Signed)
-----   Message from Gordy Levan, MD sent at 12/19/2014 10:51 AM EDT ----- Labs seen and need follow up: please let her know chemistries and CA 125 all fine

## 2014-12-20 NOTE — Telephone Encounter (Signed)
Patient notified of results as noted below by Dr. Marko Plume. Patient appreciative of call.

## 2014-12-21 ENCOUNTER — Telehealth: Payer: Self-pay | Admitting: Oncology

## 2014-12-21 ENCOUNTER — Other Ambulatory Visit: Payer: Self-pay | Admitting: *Deleted

## 2014-12-21 DIAGNOSIS — C569 Malignant neoplasm of unspecified ovary: Secondary | ICD-10-CM

## 2014-12-21 NOTE — Telephone Encounter (Signed)
Confirmed lab & flush appointment for August 3

## 2014-12-24 ENCOUNTER — Encounter: Payer: Self-pay | Admitting: Neurology

## 2014-12-24 ENCOUNTER — Ambulatory Visit (INDEPENDENT_AMBULATORY_CARE_PROVIDER_SITE_OTHER): Payer: Commercial Managed Care - HMO | Admitting: Neurology

## 2014-12-24 VITALS — BP 140/90 | HR 88 | Resp 20 | Ht 59.84 in | Wt 177.5 lb

## 2014-12-24 DIAGNOSIS — K146 Glossodynia: Secondary | ICD-10-CM | POA: Diagnosis not present

## 2014-12-24 DIAGNOSIS — F341 Dysthymic disorder: Secondary | ICD-10-CM

## 2014-12-24 DIAGNOSIS — G4700 Insomnia, unspecified: Secondary | ICD-10-CM | POA: Diagnosis not present

## 2014-12-24 DIAGNOSIS — F5105 Insomnia due to other mental disorder: Secondary | ICD-10-CM

## 2014-12-24 DIAGNOSIS — F418 Other specified anxiety disorders: Secondary | ICD-10-CM | POA: Insufficient documentation

## 2014-12-24 MED ORDER — ESZOPICLONE 2 MG PO TABS
2.0000 mg | ORAL_TABLET | Freq: Every evening | ORAL | Status: DC | PRN
Start: 2014-12-24 — End: 2015-07-12

## 2014-12-24 NOTE — Progress Notes (Signed)
[facility]  Provider:  Larey Seat, M D  Referring Provider: Cari Caraway, MD Primary Care Physician:  Cari Caraway, MD   Review sleep study.   HPI:  Kristin Webb is a 69 y.o. female , with  A history of OSA on CPAP -and persistent fatigue, is seen here as a  revisit  from Kristin Webb for CPAP follow up/ compliance visit.   12-12-13, Since I have seen her last this patient was diagnosed with ovarian cancer, underwent surgery and chemotherapy. She currently struggles with a weak  abdominal and core musculatur  and it feels to her she had an abdominal hernia. She has tried a corsett , but this has aggravated her bulging discs in her spine.   The patient was originally referred by Kristin Webb at the time with fatigue , pain, excessive sleepiness and burning mouth syndrome. In 2010 she developed transiently left mouth droop, and a TIA/ CVA work up was negative. EMG and nerve conduction studies were unrevealing , a rheumatology consult,  pain specialist , neurosurgical evaluation were unrevealing .  Labs revealed a vitamin D deficiency,  Normal TSH and CMET, CBC and neurosurgery consult  For back pain was without any results.  She was referred to me for a sleep consultation in 2009. The patient was diagnosed with sleep apnea on 03-07-08 with an AHI of 24.3 and in REM AHI of 82. Her BMI at the time was 37.9 , she was titrated to 10 cm water pressure on CPAP  which relieved her AHI but she still snored.  Kristin Webb's newest  sleep study dated 11-26-13 whowed  residual apnea. The study was therefore a normal polysomnography and not split into a titration parts. The AHI was 0.9, the RDI was 2.4 and the oxygen nadir was 87% time and his saturation was 33.6 minutes at or below 90% saturation of oxygen. Heart which was regular, normal sinus rhythm prevailed. She still sleeps better with Lunesta 2 mg could not get a good result from 1 mg pills. Fragmentation of sleep, alpha intrusion  can be seen in chronic pain and fatigue. She is not a coffee drinker, no caffeine to cut out. There is no pain in sleep. Sleep psychology referral is problematic with [employer].  Sleep hygiene was discussed.   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 She has compliantly  used a  Original machine,  New the CPAP  machine seems to have failed. I am unable to obtain any data. In addition the patient had remained excessively fatigued and daytime sleepy. Possibly , this could have been a paraneoplastic manifestation of ovarian malignancy.  She has been using Nuvigil  as tolerated which has given her improvement in life quality and energy. The last CPAP  download was on 05-06-12 with the 95% percentile pressure of 12 cm water, the residual AHI of 0.5 and an average of 5.9 hours of night-user time for CPAP.   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 Kristin Webb since being diagnosed with ovarian cancer has underwent a lot of emotional changes as well. Her oncologist Dr. Marko Plume feels that some of the nausea she experienced is not anxiety related but related to a hiatal hernia and placed her on Prilosec. So far since last week has not been a palpable results. The patient has also tried to wean off her sleep aids but became panicked and insomniac again.  Kristin Webb had been admitted to the hospital after a spot on his  lung was incidentally found , while being evaluated for kidney stones. He went into acute respiratory distress was placed on oxygen oxygen and readmitted to the hospital but never recovered. And during that time she needed to return to her previous insomnia regimen.  She is under financial distress, had a lot of diffculties with the probate.    Review of Systems: Out of a complete 14 system review, the patient complains of only the following symptoms, and  all other reviewed systems are negative. Fatigue , EDS, abdominal distention, dry mouth.  She uses Lunesta for insomnia, doubled the dose at the time of her tumour diagnosis.  She has endorsed the Epworth score at 13 and FSS at 54 points, the GDS at 3 points.     History   Social History  . Marital Status: Married    Spouse Name: [name]  . Number of Children: 2  . Years of Education: Master's   Occupational History  . Not on file.   Social History Main Topics  . Smoking status: Never Smoker   . Smokeless tobacco: Never Used  . Alcohol Use: No  . Drug Use: No  . Sexual Activity: Not on file   Other Topics Concern  . Not on file   Social History Narrative   Patient is married [name]) and lives at home with her husband.   Patient has 2 children by birth and 13 children all together.   Patient has a Oceanographer.   Patient is right-handed.   Patient drinks very little caffeine.             Family History  Problem Relation Age of Onset  . Lung cancer Mother 66  . Lung cancer Father 31    2 ppd smoker  . High blood pressure Mother   . High Cholesterol Mother   . Parkinson's disease Father   . Kidney disease      Past Medical History  Diagnosis Date  . Pelvic mass in female   . Constipation   . Diarrhea     in the past after gallbladder removal  . Hyperlipidemia   . Burning mouth syndrome   . Chronic fatigue   . Fibromyalgia   . Lumbar disc disease   . PONV (postoperative nausea and vomiting)   . Hypertension     borderline not on meds   . Shortness of breath     with exertion   . Sleep apnea     CPAP settings at 13   . Pneumonia     hx of pneumonia   . GERD (gastroesophageal reflux disease)   . Yeast infection   . Ovarian cancer 2014    Past Surgical History  Procedure Laterality Date  . Cholecystectomy      early 18s  . Abdominal hysterectomy      early 1990s  . Trigger finger release    . Laparotomy Bilateral 11/08/2012    Procedure:  EXPLORATORY LAPAROTOMY TOTAL ABDOMINAL HYSTERECTOMY BILATERAL SALPINGO OOPHORECTOMY TUMOR DEBULKING ;  Surgeon: Imagene Gurney A. Alycia Rossetti, MD;  Location: [facility];  Service: Gynecology;  Laterality: Bilateral;  APPENDECTOMY / OMENTECTOMY  . Appendectomy    . Exploratory laparotomy  11/08/12    BSO, appendectomy, omentectomy    Current Outpatient Prescriptions  Medication Sig Dispense Refill  . Armodafinil (NUVIGIL) 250 MG tablet Take 1 tablet (250 mg total) by mouth daily. (Patient taking differently: Take 250  mg by mouth daily as needed. ) 30 tablet 5  . Calcium Citrate-Vitamin D (CALCIUM CITRATE + D PO) Take 1 capsule by mouth daily.     . Cholecalciferol (VITAMIN D3) 5000 UNITS CAPS Take 5,000 mg by mouth daily.     . CRESTOR 10 MG tablet     . diphenhydrAMINE (BENADRYL) 25 mg capsule Take 50 mg by mouth at bedtime as needed.    Marland Kitchen EPINEPHrine (EPIPEN 2-PAK) 0.3 mg/0.3 mL DEVI Inject 0.3 mg into the muscle once. For bee stings    . eszopiclone (LUNESTA) 2 MG TABS tablet Take 1 tablet (2 mg total) by mouth at bedtime as needed for sleep. Take immediately before bedtime 30 tablet 5  . fish oil-omega-3 fatty acids 1000 MG capsule Take 1 g by mouth 2 (two) times daily.    Marland Kitchen FLUoxetine (PROZAC) 10 MG capsule Take 30 mg by mouth every evening.    Marland Kitchen FLUoxetine (PROZAC) 20 MG capsule     . ibuprofen (ADVIL,MOTRIN) 200 MG tablet Take 200 mg by mouth every 6 (six) hours as needed for pain.    . Multiple Vitamin (MULTIVITAMIN WITH MINERALS) TABS Take 1 tablet by mouth daily.    Marland Kitchen OVER THE COUNTER MEDICATION Nystatin topical powder  100,000 units/ GM  Patient uses as needed    . pantoprazole (PROTONIX) 40 MG tablet Take 1 tablet (40 mg total) by mouth daily. 30 tablet 1  . triamterene-hydrochlorothiazide (MAXZIDE-25) 37.5-25 MG per tablet Take 1 tablet by mouth daily. 30 tablet 1   No current facility-administered medications for this visit.    Allergies as of 12/24/2014 - Review Complete 12/24/2014   Allergen Reaction Noted  . Bee venom Hives 11/02/2012  . Cortisone  11/02/2012    Vitals: BP 140/90 mmHg  Pulse 88  Resp 20  Ht 4' 11.84" (1.52 m)  Wt 177 lb 8 oz (80.513 kg)  BMI 34.85 kg/m2 Last Weight:  Wt Readings from Last 1 Encounters:  12/24/14 177 lb 8 oz (80.513 kg)   Last Height:   Ht Readings from Last 1 Encounters:  12/24/14 4' 11.84" (1.52 m)    Physical exam:  General: The patient is awake, alert and appears not in acute distress. The patient is well groomed. Head: Normocephalic, atraumatic. Neck is supple. Mallampati 3 , neck circumference:  Cardiovascular:  Regular rate and rhythm , without  murmurs or carotid bruit, and without distended neck veins. Respiratory: Lungs are clear to auscultation. Skin:  Without evidence of edema, or rash Trunk: BMI remains  Elevated, this  patient has normal posture.  Neurologic exam : The patient is awake and alert, oriented to place and time.  Memory subjective  described as intact. There is a normal attention span & concentration ability. Speech is fluent without  dysarthria, dysphonia or aphasia. Mood and affect are appropriate.  Cranial nerves: Pupils are equal and briskly reactive to light. Funduscopic exam without evidence of pallor or edema.  Extraocular movements  in vertical and horizontal planes intact and without nystagmus. Visual fields by finger perimetry are intact. Hearing to finger rub intact.   Facial sensation intact to fine touch. Facial motor strength is symmetric and tongue and uvula move midline. Motor exam:  Normal tone , l muscle bulk and symmetric strength in all extremities. Grip strength is improved.  Gait and station: Patient walks without assistive device , Strength within normal limits. Stance is stable and normal.  Steps are unfragmented.  Deep tendon reflexes: in the  upper and lower extremities are symmetric and intact.    Assessment:  After physical and neurologic examination, review of  laboratory studies, imaging, neurophysiology testing and pre-existing records, assessment is   1) OSA : resolved - AHI 0.9.   2) insomnia worsened again , since her husband died, he worked as a Education officer, museum.  3) ovarian cancer , anxiety and grief.   Plan: Lunesta 2 mg refill.  She was unable in 2015 to find an appointment,   will pursue the counseling on her own.

## 2014-12-24 NOTE — Patient Instructions (Signed)
Eszopiclone tablets What is this medicine? ESZOPICLONE (es ZOE pi clone) is used to treat insomnia. This medicine helps you to fall asleep and sleep through the night. This medicine may be used for other purposes; ask your health care provider or pharmacist if you have questions. COMMON BRAND NAME(S): Lunesta What should I tell my health care provider before I take this medicine? They need to know if you have any of these conditions: -depression -history of a drug or alcohol abuse problem -liver disease -lung or breathing disease -suicidal thoughts -an unusual or allergic reaction to eszopiclone, other medicines, foods, dyes, or preservatives -pregnant or trying to get pregnant -breast-feeding How should I use this medicine? Take this medicine by mouth with a glass of water. Follow the directions on the prescription label. It is better to take this medicine on an empty stomach and only when you are ready for bed. Do not take your medicine more often than directed. If you have been taking this medicine for several weeks and suddenly stop taking it, you may get unpleasant withdrawal symptoms. Your doctor or health care professional may want to gradually reduce the dose. Do not stop taking this medicine on your own. Always follow your doctor or health care professional's advice. Talk to your pediatrician regarding the use of this medicine in children. Special care may be needed. Overdosage: If you think you have taken too much of this medicine contact a poison control center or emergency room at once. NOTE: This medicine is only for you. Do not share this medicine with others. What if I miss a dose? This does not apply. This medicine should only be taken immediately before going to sleep. Do not take double or extra doses. What may interact with this medicine? -herbal medicines like kava kava, melatonin, St. John's wort and valerian -lorazepam -medicines for fungal infections like ketoconazole,  fluconazole, or itraconazole -olanzapine This list may not describe all possible interactions. Give your health care provider a list of all the medicines, herbs, non-prescription drugs, or dietary supplements you use. Also tell them if you smoke, drink alcohol, or use illegal drugs. Some items may interact with your medicine. What should I watch for while using this medicine? Visit your doctor or health care professional for regular checks on your progress. Keep a regular sleep schedule by going to bed at about the same time nightly. Avoid caffeine-containing drinks in the evening hours, as caffeine can cause trouble with falling asleep. Talk to your doctor if you still have trouble sleeping. Do not take this medicine unless you are able to get a full night's sleep before you must be active again. You may not be able to remember things that you do in the hours after you take this medicine. Some people have reported driving, making phone calls, or preparing and eating food while asleep after taking sleep medicine. Take this medicine right before going to sleep. Tell your doctor if you have any problems with your memory. After you stop taking this medicine, you may notice some trouble falling asleep. This is called rebound insomnia. This problem usually goes away on its own after 1 or 2 nights. You may get drowsy or dizzy. Do not drive, use machinery, or do anything that needs mental alertness until you know how this medicine affects you. Do not stand or sit up quickly, especially if you are an older patient. This reduces the risk of dizzy or fainting spells. Alcohol may interfere with the effect of this medicine.  Avoid alcoholic drinks. This medicine may cause a decrease in mental alertness the day after use, even if you feel that you are fully awake. Tell your doctor if you will need to perform activities requiring full alertness, such as driving, the next day after you have taken this medicine. What side  effects may I notice from receiving this medicine? Side effects that you should report to your doctor or health care professional as soon as possible: -allergic reactions like skin rash, itching or hives, swelling of the face, lips, or tongue -changes in vision -confusion -depressed mood -feeling faint or lightheaded, falls -hallucinations -problems with balance, speaking, walking -restlessness, excitability, or feelings of agitation -unusual activities while asleep like driving, eating, making phone calls Side effects that usually do not require medical attention (report to your doctor or health care professional if they continue or are bothersome): -dizziness, or daytime drowsiness, sometimes called a hangover effect -headache This list may not describe all possible side effects. Call your doctor for medical advice about side effects. You may report side effects to FDA at 1-800-FDA-1088. Where should I keep my medicine? Keep out of the reach of children. This medicine can be abused. Keep your medicine in a safe place to protect it from theft. Do not share this medicine with anyone. Selling or giving away this medicine is dangerous and against the law. Store at room temperature between 15 and 30 degrees C (59 and 86 degrees F). Throw away any unused medicine after the expiration date. NOTE: This sheet is a summary. It may not cover all possible information. If you have questions about this medicine, talk to your doctor, pharmacist, or health care provider.  2015, Elsevier/Gold Standard. (2012-12-22 17:42:58)

## 2015-02-07 NOTE — Telephone Encounter (Signed)
Error

## 2015-02-13 ENCOUNTER — Ambulatory Visit (HOSPITAL_BASED_OUTPATIENT_CLINIC_OR_DEPARTMENT_OTHER): Payer: Commercial Managed Care - HMO

## 2015-02-13 VITALS — BP 120/85 | HR 85 | Temp 98.0°F

## 2015-02-13 DIAGNOSIS — C5701 Malignant neoplasm of right fallopian tube: Secondary | ICD-10-CM

## 2015-02-13 DIAGNOSIS — Z452 Encounter for adjustment and management of vascular access device: Secondary | ICD-10-CM | POA: Diagnosis not present

## 2015-02-13 DIAGNOSIS — Z95828 Presence of other vascular implants and grafts: Secondary | ICD-10-CM

## 2015-02-13 MED ORDER — HEPARIN SOD (PORK) LOCK FLUSH 100 UNIT/ML IV SOLN
500.0000 [IU] | Freq: Once | INTRAVENOUS | Status: AC
  Administered 2015-02-13: 500 [IU] via INTRAVENOUS
  Filled 2015-02-13: qty 5

## 2015-02-13 MED ORDER — SODIUM CHLORIDE 0.9 % IJ SOLN
10.0000 mL | INTRAMUSCULAR | Status: DC | PRN
  Administered 2015-02-13: 10 mL via INTRAVENOUS
  Filled 2015-02-13: qty 10

## 2015-02-13 NOTE — Patient Instructions (Signed)

## 2015-02-26 ENCOUNTER — Other Ambulatory Visit: Payer: Self-pay | Admitting: *Deleted

## 2015-02-26 DIAGNOSIS — R110 Nausea: Secondary | ICD-10-CM

## 2015-02-26 DIAGNOSIS — K2970 Gastritis, unspecified, without bleeding: Secondary | ICD-10-CM

## 2015-02-26 MED ORDER — PANTOPRAZOLE SODIUM 40 MG PO TBEC: 40.0000 mg | DELAYED_RELEASE_TABLET | Freq: Every day | ORAL | Status: DC

## 2015-02-26 NOTE — Telephone Encounter (Signed)
Signed prescription faxed to [facility] at [phone]. Original prescription sent to [facility] to be scanned into patient's chart.

## 2015-03-04 DIAGNOSIS — R739 Hyperglycemia, unspecified: Secondary | ICD-10-CM | POA: Diagnosis not present

## 2015-03-04 DIAGNOSIS — R197 Diarrhea, unspecified: Secondary | ICD-10-CM | POA: Diagnosis not present

## 2015-03-04 DIAGNOSIS — M797 Fibromyalgia: Secondary | ICD-10-CM | POA: Diagnosis not present

## 2015-03-04 DIAGNOSIS — I10 Essential (primary) hypertension: Secondary | ICD-10-CM | POA: Diagnosis not present

## 2015-03-04 DIAGNOSIS — E559 Vitamin D deficiency, unspecified: Secondary | ICD-10-CM | POA: Diagnosis not present

## 2015-03-04 DIAGNOSIS — Z0000 Encounter for general adult medical examination without abnormal findings: Secondary | ICD-10-CM | POA: Diagnosis not present

## 2015-03-04 DIAGNOSIS — E782 Mixed hyperlipidemia: Secondary | ICD-10-CM | POA: Diagnosis not present

## 2015-03-04 DIAGNOSIS — M545 Low back pain: Secondary | ICD-10-CM | POA: Diagnosis not present

## 2015-03-13 ENCOUNTER — Other Ambulatory Visit: Payer: Self-pay | Admitting: *Deleted

## 2015-03-13 ENCOUNTER — Encounter: Payer: Self-pay | Admitting: Gynecologic Oncology

## 2015-03-13 ENCOUNTER — Encounter: Payer: Self-pay | Admitting: *Deleted

## 2015-03-13 ENCOUNTER — Other Ambulatory Visit: Payer: Commercial Managed Care - HMO

## 2015-03-13 ENCOUNTER — Ambulatory Visit: Payer: Commercial Managed Care - HMO | Attending: Gynecologic Oncology | Admitting: Gynecologic Oncology

## 2015-03-13 ENCOUNTER — Ambulatory Visit (HOSPITAL_BASED_OUTPATIENT_CLINIC_OR_DEPARTMENT_OTHER): Payer: Commercial Managed Care - HMO

## 2015-03-13 VITALS — BP 125/83 | HR 86 | Temp 97.5°F | Resp 18 | Ht 59.75 in | Wt 177.0 lb

## 2015-03-13 DIAGNOSIS — C5701 Malignant neoplasm of right fallopian tube: Secondary | ICD-10-CM | POA: Insufficient documentation

## 2015-03-13 DIAGNOSIS — C569 Malignant neoplasm of unspecified ovary: Secondary | ICD-10-CM | POA: Diagnosis not present

## 2015-03-13 DIAGNOSIS — Z006 Encounter for examination for normal comparison and control in clinical research program: Secondary | ICD-10-CM | POA: Diagnosis not present

## 2015-03-13 DIAGNOSIS — Z95828 Presence of other vascular implants and grafts: Secondary | ICD-10-CM

## 2015-03-13 DIAGNOSIS — Z9221 Personal history of antineoplastic chemotherapy: Secondary | ICD-10-CM | POA: Insufficient documentation

## 2015-03-13 DIAGNOSIS — C5700 Malignant neoplasm of unspecified fallopian tube: Secondary | ICD-10-CM | POA: Diagnosis not present

## 2015-03-13 MED ORDER — SODIUM CHLORIDE 0.9 % IJ SOLN
10.0000 mL | INTRAMUSCULAR | Status: DC | PRN
  Administered 2015-03-13: 10 mL via INTRAVENOUS
  Filled 2015-03-13: qty 10

## 2015-03-13 MED ORDER — HEPARIN SOD (PORK) LOCK FLUSH 100 UNIT/ML IV SOLN
500.0000 [IU] | Freq: Once | INTRAVENOUS | Status: AC
  Administered 2015-03-13: 500 [IU] via INTRAVENOUS
  Filled 2015-03-13: qty 5

## 2015-03-13 NOTE — Progress Notes (Signed)
03/13/15 GOG 225 - month 21 on-study visit - The pt was into the cancer center this afternoon for her month 21 on-study visit.  The pt said that she is still dealing with her husband's death.  The research nurse discussed grief support services with the pt.  She was interested in hearing more about grief counseling. The pt requested the nurse email her with contact information.  The research nurse emailed her [name], lead chaplain, phone number.  The pt's port was accessed for her labs including her CA.125.  The pt weight was obtained shoeless.  The pt said that she has really tried hard to lose some weight.  The pt's weight was down 3 lbs since her last office visit. The pt's waist measurement was obtained using the Gulick II tape measure per protocol (110 cm) .  The pt's waist measurement may not be accurate due to the pt's large ventral hernia. The pt's concomitant medications were reviewed with the pt.   The pt denies any new medications.  She said that she is no longer taking Fish Oil and melatonin.  The pt was seen and examined by [name].  The pt's next appt will be in October for her month 24 visit.  The pt confirmed the following AE's as ongoing:  insomnia (taking Lunesta), fatigue (chronic condition) uses Nuvigil as needed, and back pain, chronic.  The pt reports that she has developed some abdominal cramping (grade 1)with diarrhea (grade 2). The pt denies any nausea.  The pt was thanked for her continued support of this trial.   Brion Aliment RN, BSN, CCRP Clinical Research Nurse 03/13/2015 3:44 PM

## 2015-03-13 NOTE — Patient Instructions (Signed)
Plan to follow up with Dr. Marko Plume in October 2016 as scheduled, port flush with CA 125 in Dec 2016, and see [name] in Feb 2017.  Please call in October or November to schedule your appt with [name] in Feb.  Please call for any questions or concerns.  Follow up with your PCP about your GI symptoms.

## 2015-03-13 NOTE — Progress Notes (Signed)
Consult Note: Gyn-Onc  Kristin Webb 69 y.o. female  CC:  Chief Complaint  Patient presents with  . fallopian tube carcinoma    follow up    HPI: Kristin Webb is a 69 year old, gravida 2 para 2, who noticed increasing abdominal swelling around the third week of January. Initially, the abdominal swelling was intermittent and would come and go. She related this to recent initiation of a calcium supplement. The abdominal swelling became more prevalent starting in February with abdominal pain reported also. In addition, she started having increasing constipation and formed stools, which was a change in her routine of 5-6 loose bowel movements a day chronically after her cholecystectomy. On November 01, 2012, she had a CT scan of the abdomen and pelvis which resulted no significant biliary dilation, no suspicious liver lesions, and the spleen, adrenal glands and pancreas appeared normal. 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 She has a large midabdominal mass measuring 17 x 22.7 cm x 20 cm cephalad, which was well circumscribed without calcifications. There is irregular thickened septations and areas of solid nodularity consistent with malignancy. There is no bowel obstruction and the mass appeared to be separate from the appendix. There was probable normal left ovarian tissue seen. The uterus is surgically absent. There is some soft tissue stranding at the base of the mesentery superior to the mass and some omental nodularity.   On November 08, 2012, she underwent an exploratory laparotomy, BSO, appendectomy, infracolic omentectomy, and optimal debulking. Operative findings included: 25 cm right adnexal mass with smooth surface. Surgically absent uterus. Atrophic-appearing left ovary. Normal appearing appendix. Within the omentum there were centimeter nodules scattered throughout the omentum. The remainder of the surfaces were benign.  Final pathology revealed:  1. 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 - BENIGN FALLOPIAN TUBE WITH NONSPECIFIC CHRONIC INFLAMMATION. 2. Ovary and fallopian tube, left - BENIGN OVARY; NEGATIVE FOR ATYPIA OR MALIGNANCY. - BENIGN FALLOPIAN TUBE; NEGATIVE FOR ATYPIA OR MALIGNANCY. 3. Omentum, resection for tumor - HIGH GRADE CARCINOMA, SEE COMMENT. 4. Appendix, Other than Incidental - FIBROUS OBLITERATION OF APPENDICEAL TIP. - NEGATIVE FOR MALIGNANCY.  She completed day 1, cycle 6 of 6 planned treatments of carboplatin and taxol on 03/28/13.   9/14 CT Findings: The liver is diffusely fatty infiltrated and measures 18.7 cm in cranial caudal length. No focal intrahepatic parenchymal abnormality. The spleen is unremarkable. The stomach, duodenum, pancreas and adrenal glands are unremarkable. The gallbladder is surgically absent. 11 x 9 x 11 mm nonobstructing stone is identified in the interpolar right kidney. Stable appearance of a 9 mm fatty lesion in the right kidney, likely a tiny angiomyolipoma. No abdominal aortic aneurysm. No free fluid in the abdomen. No abdominal lymphadenopathy. Haziness in the root of the small bowel mesentery is stable. Approximately 8 cm cranial to the umbilicus is an area of apparent focal midline fascial laxity. I cannot identify a discrete fascial defect to suggest an overt hernia. This is a wide-mouthed fascial bulge and some of the transverse colon projects out into this area of fascial bulging. No bowel wall thickening, adjacent edema, orfluid to suggest complication. Imaging through the pelvis shows no free intraperitoneal fluid. No pelvic sidewall lymphadenopathy. The uterus is surgically absent. The large complex cystic lesion seen in the  right central abdomen and pelvis has been resected in the interval. No substantial  diverticular disease in the colon. There is no colonic diverticulitis. Terminal ileum is normal. The appendix is not visualized, but there is no edema or inflammation in the region of the cecum. The right gonadal vein is enlarged and contains a central filling defect consistent with thrombus. Bone windows reveal no worrisome lytic or sclerotic osseous lesions.  IMPRESSION:  Interval resection of the large right pelvic and lower abdominal mass lesion with apparent omentectomy. No evidence for intraperitoneal free fluid on today's study. No discernible peritoneal lesions. 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  Interval History:  She was last seen by GYN oncology in 1/16. At that time her exam was negative. She was last seen by Dr. Marko Plume in May 2016. Exam at that time was similarly unremarkable. Her CA 125 was 5 when last drawn. She comes in today for followup. She had a colonoscopy about a year ago. She is on GOG 225.  The simulation that has occurred is that her husband passed away somewhat unexpectedly in early April. He apparently had a lung biopsy that revealed lung cancer. Since that time she has been dealing fairly well. She states that she has at diarrhea currently that is different than the diarrhea she's had the past. She was seen by her primary care physician with complaints of nausea that was at one point felt to be related to stress. She was tender at the level of the xiphoid and Prilosec was started the nausea stopped and she discontinue the Prilosec. She then started the Prilosec again and can't temporally know if it is all related to the diarrhea. She states that her stools are dark in color with some green tinging. There is no significant odor. She's not believe is consistent with C. Difficile she has Smeltz that infection. Before in the stool does not have that infection. She would describe it as a "pea soup. She has not changed her diet anyway. She will have 5-6 stools per day and  they can be enlarged quantity. She does believe she's keeping up with her fluids. She denies any blood in her stools. She occasionally has some cramping. The cramping is typically just prior to her having a bowel movement. She has had a slight cough for the past few months but it has not changed in any way. She recently had a negative sleep study.    Review of Systems:  Constitutional:  She denies any fevers.  Weight loss of 6 pounds in 6 months Skin:  No rash Cardiovascular: No chest pain,  No SOB Pulmonary: No cough Gastro Intestinal: No nausea, vomiting, constipation, + diarrhea as above. No bright red blood per rectum or change in bowel movement.  Genitourinary: Denies vaginal bleeding and discharge.  Musculoskeletal: She has some back pain. Neurologic: No weakness, numbness, or change in gait.  Psychology:  Recent issue secondary to the unexpected loss of her husband in April.  They have been married for over 40 years.  She'll be moving closer to [facility] and leaving other home which she states will make things easier and better for her and she will be closer to support   Current Meds:  Outpatient Encounter Prescriptions as of 03/13/2015  Medication Sig  . Armodafinil (NUVIGIL) 250 MG tablet Take 1 tablet (250 mg total) by mouth daily. (Patient taking differently: Take 250 mg by mouth daily as needed. )  . Calcium Citrate-Vitamin D (CALCIUM CITRATE + D  PO) Take 1 capsule by mouth daily.   . Cholecalciferol (VITAMIN D PO) Take 6,000 Units by mouth daily.  . diphenhydrAMINE (BENADRYL) 25 mg capsule Take 50 mg by mouth at bedtime as needed.  . eszopiclone (LUNESTA) 2 MG TABS tablet Take 1 tablet (2 mg total) by mouth at bedtime as needed for sleep. Take immediately before bedtime  . FLUoxetine (PROZAC) 10 MG capsule Take 10 mg by mouth daily.   Marland Kitchen FLUoxetine (PROZAC) 20 MG capsule Take 20 mg by mouth daily. Take with 10mg  tablet for total dose of 30mg  daily  . ibuprofen (ADVIL,MOTRIN) 200  MG tablet Take 200 mg by mouth every 6 (six) hours as needed for pain.  . Multiple Vitamin (MULTIVITAMIN WITH MINERALS) TABS Take 1 tablet by mouth daily.  Marland Kitchen OVER THE COUNTER MEDICATION Nystatin topical powder  100,000 units/ GM  Patient uses as needed  . pantoprazole (PROTONIX) 40 MG tablet Take 1 tablet (40 mg total) by mouth daily.  Marland Kitchen triamterene-hydrochlorothiazide (MAXZIDE-25) 37.5-25 MG per tablet Take 1 tablet by mouth daily.  . CRESTOR 10 MG tablet Take 10 mg by mouth daily.   Marland Kitchen EPINEPHrine (EPIPEN 2-PAK) 0.3 mg/0.3 mL DEVI Inject 0.3 mg into the muscle once. For bee stings  . [DISCONTINUED] Cholecalciferol (VITAMIN D3) 5000 UNITS CAPS Take 5,000 mg by mouth daily.   . [DISCONTINUED] fish oil-omega-3 fatty acids 1000 MG capsule Take 1 g by mouth 2 (two) times daily.  . [DISCONTINUED] sodium chloride 0.9 % injection 10 mL    No facility-administered encounter medications on file as of 03/13/2015.    Allergy:  Allergies  Allergen Reactions  . Bee Venom Hives    Difficulty breathing, carries an EPI-pen  . Cortisone     Injected cortisone, "sick to my stomach, throwing up, hand swelling, and pain."    Social Hx:   History   Social History  . Marital Status: Married    Spouse Name: Kristin Webb  . Number of Children: 2  . Years of Education: Master's   Occupational History  . Not on file.   Social History Main Topics  . Smoking status: Never Smoker   . Smokeless tobacco: Never Used  . Alcohol Use: No  . Drug Use: No  . Sexual Activity: Not on file   Other Topics Concern  . Not on file   Social History Narrative   Patient is married Kristin Webb) and lives at home with her husband.   Patient has 2 children by birth and 13 children all together.   Patient has a Oceanographer.   Patient is right-handed.   Patient drinks very little caffeine.             Past Surgical Hx:  Past Surgical History  Procedure Laterality Date  . Cholecystectomy      early 35s  . Abdominal  hysterectomy      early 1990s  . Trigger finger release    . Laparotomy Bilateral 11/08/2012    Procedure: EXPLORATORY LAPAROTOMY TOTAL ABDOMINAL HYSTERECTOMY BILATERAL SALPINGO OOPHORECTOMY TUMOR DEBULKING ;  Surgeon: Imagene Gurney A. Alycia Rossetti, MD;  Location: [facility];  Service: Gynecology;  Laterality: Bilateral;  APPENDECTOMY / OMENTECTOMY  . Appendectomy    . Exploratory laparotomy  11/08/12    BSO, appendectomy, omentectomy    Past Medical Hx:  Past Medical History  Diagnosis Date  . Pelvic mass in female   . Constipation   . Diarrhea     in the past after gallbladder removal  . Hyperlipidemia   .  Burning mouth syndrome   . Chronic fatigue   . Fibromyalgia   . Lumbar disc disease   . PONV (postoperative nausea and vomiting)   . Hypertension     borderline not on meds   . Shortness of breath     with exertion   . Sleep apnea     CPAP settings at 13   . Pneumonia     hx of pneumonia   . GERD (gastroesophageal reflux disease)   . Yeast infection   . 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 negatve post treatmen CT 9/14    Fallopian tube carcinoma   11/01/2012 Initial Diagnosis Fallopian tube carcinoma   11/08/2012 Surgery Debulking, optima. IA ovarian and IIIB fallopian tube   12/13/2012 - 03/28/2013 Chemotherapy s/p 6 cycles of paclitaxel and carboplatin    Family Hx:  Family History  Problem Relation Age of Onset  . Lung cancer Mother 28  . Lung cancer Father 32    2 ppd smoker  . High blood pressure Mother   . High Cholesterol Mother   . Parkinson's disease Father   . Kidney disease      Vitals:  Blood pressure 125/83, pulse 86, temperature 97.5 F  (36.4 C), temperature source Oral, resp. rate 18, height 4' 11.75" (1.518 m), weight 177 lb (80.287 kg), SpO2 100 %.  Physical Exam: General: Well developed, well nourished female in no acute distress. Alert and oriented x 3.   Head/Neck:  Supple without any enlargements.   Lymph node survey: No cervical, supraclavicular, or inguinal adenopathy.   Cardiovascular: Regular rate and rhythm.   Lungs: Clear to auscultation bilaterally.   Abdomen: Abdomen soft, non-tender and obese. Active bowel sounds in all quadrants. No evidence of a fluid wave. Midline abdominal incision well healed. Questionable hernia versus laxity of the abdominal wall.  Genitourinary:  Vulva/vagina: Normal external female genitalia. No lesions.  Urethra: No lesions or masses.  Vagina: No masses or nodularity Rectal: Good tone, no palpable masses or nodularity.   Extremities: No bilateral cyanosis, edema, or clubbing.   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 She was completely resected to an R0. She's completed her chemotherapy and had a post treatment CT scan that was negative in 9/14. Her last CA 125 was normal. It has never been elevated.  She has no evidence of recurrent disease.  Per protocol she will follow up with Dr. Marko Plume  As scheduled and return to see me in approximately the 36 months. I've encouraged her to contact her primary care physician regarding her stools. She may need to provide a stool specimen when she sees them in the office. She'll contact me if there are any issues prior to her next visit. Support and condolences were given for her unexpected loss.  Kristin Webb A., MD 03/13/2015, 1:52 PM

## 2015-03-13 NOTE — Patient Instructions (Signed)

## 2015-03-14 ENCOUNTER — Telehealth: Payer: Self-pay | Admitting: *Deleted

## 2015-03-14 LAB — CA 125: CA 125: 5 U/mL (ref <35)

## 2015-03-14 NOTE — Telephone Encounter (Signed)
Per Joylene John, NP, patient notified of normal CA125 results from 03-13-15. Patient appreciative of the call.

## 2015-03-17 DIAGNOSIS — S93602A Unspecified sprain of left foot, initial encounter: Secondary | ICD-10-CM | POA: Diagnosis not present

## 2015-03-17 DIAGNOSIS — S93402A Sprain of unspecified ligament of left ankle, initial encounter: Secondary | ICD-10-CM | POA: Diagnosis not present

## 2015-03-18 ENCOUNTER — Encounter: Payer: Self-pay | Admitting: General Practice

## 2015-03-18 NOTE — Progress Notes (Signed)
Spiritual Care Note  Returned phone call from [name] (per referral from Doristine Johns, RN) to share resources related to grief support, per pt's request.  Kristin Webb shared about the shocking diagnosis and death of her husband in 07-Jan-2023, which followed her own cancer dx/tx, which in turn followed the loss of their adult son.  Kristin Webb reports good support from her pastor, Kristin Webb at [employer], as well as desire for support in a group setting.  Kristin Webb verbalized the intellectual resources she has for coping (her husband was a therapist), as well as her need for experiencing support and connection with others who are navigating grief and healing. Provided empathic listening, normalization of feelings, and pastoral reflection.    Suggested several Hospice resources:  Support groups for bereaved spouses and parents of adult children, grief-support workshops, and 1:1 counseling.  Reminded her of ongoing chaplain availability for bereavement support and spiritual direction, and Health visitor counseling intern for 1:1 support on campus, as well.  Please also page as needs arise.  Thank you.  [place], [place] Pager [phone] Voicemail  [phone]

## 2015-04-09 ENCOUNTER — Ambulatory Visit (HOSPITAL_BASED_OUTPATIENT_CLINIC_OR_DEPARTMENT_OTHER): Payer: Commercial Managed Care - HMO

## 2015-04-09 VITALS — BP 133/64 | HR 97 | Temp 97.4°F | Resp 15

## 2015-04-09 DIAGNOSIS — M859 Disorder of bone density and structure, unspecified: Secondary | ICD-10-CM | POA: Diagnosis not present

## 2015-04-09 DIAGNOSIS — C5701 Malignant neoplasm of right fallopian tube: Secondary | ICD-10-CM

## 2015-04-09 DIAGNOSIS — Z452 Encounter for adjustment and management of vascular access device: Secondary | ICD-10-CM | POA: Diagnosis not present

## 2015-04-09 DIAGNOSIS — M8589 Other specified disorders of bone density and structure, multiple sites: Secondary | ICD-10-CM | POA: Diagnosis not present

## 2015-04-09 DIAGNOSIS — Z95828 Presence of other vascular implants and grafts: Secondary | ICD-10-CM

## 2015-04-09 MED ORDER — SODIUM CHLORIDE 0.9 % IJ SOLN
10.0000 mL | INTRAMUSCULAR | Status: DC | PRN
  Administered 2015-04-09: 10 mL via INTRAVENOUS
  Filled 2015-04-09: qty 10

## 2015-04-09 MED ORDER — HEPARIN SOD (PORK) LOCK FLUSH 100 UNIT/ML IV SOLN
500.0000 [IU] | Freq: Once | INTRAVENOUS | Status: AC
  Administered 2015-04-09: 500 [IU] via INTRAVENOUS
  Filled 2015-04-09: qty 5

## 2015-04-09 NOTE — Patient Instructions (Signed)

## 2015-05-13 DIAGNOSIS — J329 Chronic sinusitis, unspecified: Secondary | ICD-10-CM | POA: Diagnosis not present

## 2015-05-29 ENCOUNTER — Other Ambulatory Visit: Payer: Self-pay | Admitting: *Deleted

## 2015-05-29 DIAGNOSIS — C569 Malignant neoplasm of unspecified ovary: Secondary | ICD-10-CM

## 2015-05-31 ENCOUNTER — Other Ambulatory Visit (HOSPITAL_BASED_OUTPATIENT_CLINIC_OR_DEPARTMENT_OTHER): Payer: Commercial Managed Care - HMO

## 2015-05-31 ENCOUNTER — Ambulatory Visit (HOSPITAL_BASED_OUTPATIENT_CLINIC_OR_DEPARTMENT_OTHER): Payer: Commercial Managed Care - HMO

## 2015-05-31 DIAGNOSIS — C569 Malignant neoplasm of unspecified ovary: Secondary | ICD-10-CM | POA: Diagnosis not present

## 2015-05-31 DIAGNOSIS — Z006 Encounter for examination for normal comparison and control in clinical research program: Secondary | ICD-10-CM | POA: Diagnosis not present

## 2015-05-31 DIAGNOSIS — C5701 Malignant neoplasm of right fallopian tube: Secondary | ICD-10-CM

## 2015-05-31 DIAGNOSIS — Z95828 Presence of other vascular implants and grafts: Secondary | ICD-10-CM

## 2015-05-31 LAB — CBC WITH DIFFERENTIAL/PLATELET
BASO%: 0.9 % (ref 0.0–2.0)
Basophils Absolute: 0 10*3/uL (ref 0.0–0.1)
EOS%: 3.1 % (ref 0.0–7.0)
Eosinophils Absolute: 0.2 10*3/uL (ref 0.0–0.5)
HCT: 43.6 % (ref 34.8–46.6)
HGB: 14.4 g/dL (ref 11.6–15.9)
LYMPH%: 34.6 % (ref 14.0–49.7)
MCH: 28.8 pg (ref 25.1–34.0)
MCHC: 33 g/dL (ref 31.5–36.0)
MCV: 87.4 fL (ref 79.5–101.0)
MONO#: 0.5 10*3/uL (ref 0.1–0.9)
MONO%: 8.5 % (ref 0.0–14.0)
NEUT#: 2.9 10*3/uL (ref 1.5–6.5)
NEUT%: 52.9 % (ref 38.4–76.8)
Platelets: 156 10*3/uL (ref 145–400)
RBC: 4.99 10*6/uL (ref 3.70–5.45)
RDW: 13.6 % (ref 11.2–14.5)
WBC: 5.5 10*3/uL (ref 3.9–10.3)
lymph#: 1.9 10*3/uL (ref 0.9–3.3)

## 2015-05-31 LAB — COMPREHENSIVE METABOLIC PANEL (CC13)
ALT: 20 U/L (ref 0–55)
AST: 16 U/L (ref 5–34)
Albumin: 4 g/dL (ref 3.5–5.0)
Alkaline Phosphatase: 83 U/L (ref 40–150)
Anion Gap: 9 mEq/L (ref 3–11)
BUN: 17.2 mg/dL (ref 7.0–26.0)
CO2: 28 mEq/L (ref 22–29)
Calcium: 10.1 mg/dL (ref 8.4–10.4)
Chloride: 104 mEq/L (ref 98–109)
Creatinine: 0.7 mg/dL (ref 0.6–1.1)
EGFR: 89 mL/min/{1.73_m2} — ABNORMAL LOW (ref >90)
Glucose: 103 mg/dl (ref 70–140)
Potassium: 4 mEq/L (ref 3.5–5.1)
Sodium: 140 mEq/L (ref 136–145)
Total Bilirubin: 0.42 mg/dL (ref 0.20–1.20)
Total Protein: 6.9 g/dL (ref 6.4–8.3)

## 2015-05-31 LAB — RESEARCH LABS

## 2015-05-31 MED ORDER — SODIUM CHLORIDE 0.9 % IJ SOLN
10.0000 mL | INTRAMUSCULAR | Status: DC | PRN
Start: 2015-05-31 — End: 2015-05-31
  Administered 2015-05-31: 10 mL via INTRAVENOUS
  Filled 2015-05-31: qty 10

## 2015-05-31 MED ORDER — HEPARIN SOD (PORK) LOCK FLUSH 100 UNIT/ML IV SOLN
500.0000 [IU] | Freq: Once | INTRAVENOUS | Status: AC
  Administered 2015-05-31: 500 [IU] via INTRAVENOUS
  Filled 2015-05-31: qty 5

## 2015-06-01 LAB — CA 125: CA 125: 7 U/mL (ref <35)

## 2015-06-02 ENCOUNTER — Other Ambulatory Visit: Payer: Self-pay | Admitting: Oncology

## 2015-06-03 ENCOUNTER — Encounter: Payer: Self-pay | Admitting: *Deleted

## 2015-06-03 ENCOUNTER — Other Ambulatory Visit: Payer: Commercial Managed Care - HMO

## 2015-06-03 ENCOUNTER — Ambulatory Visit (HOSPITAL_BASED_OUTPATIENT_CLINIC_OR_DEPARTMENT_OTHER): Payer: Commercial Managed Care - HMO | Admitting: Oncology

## 2015-06-03 ENCOUNTER — Telehealth: Payer: Self-pay | Admitting: Oncology

## 2015-06-03 VITALS — BP 154/91 | HR 107 | Temp 98.0°F | Resp 18 | Ht 60.0 in | Wt 176.9 lb

## 2015-06-03 DIAGNOSIS — C569 Malignant neoplasm of unspecified ovary: Secondary | ICD-10-CM

## 2015-06-03 DIAGNOSIS — Z23 Encounter for immunization: Secondary | ICD-10-CM | POA: Diagnosis not present

## 2015-06-03 DIAGNOSIS — E66811 Obesity, class 1: Secondary | ICD-10-CM

## 2015-06-03 DIAGNOSIS — G629 Polyneuropathy, unspecified: Secondary | ICD-10-CM

## 2015-06-03 DIAGNOSIS — C5701 Malignant neoplasm of right fallopian tube: Secondary | ICD-10-CM

## 2015-06-03 DIAGNOSIS — R197 Diarrhea, unspecified: Secondary | ICD-10-CM | POA: Diagnosis not present

## 2015-06-03 DIAGNOSIS — Z1239 Encounter for other screening for malignant neoplasm of breast: Secondary | ICD-10-CM

## 2015-06-03 DIAGNOSIS — R922 Inconclusive mammogram: Secondary | ICD-10-CM

## 2015-06-03 DIAGNOSIS — E669 Obesity, unspecified: Secondary | ICD-10-CM

## 2015-06-03 DIAGNOSIS — R9230 Dense breasts, unspecified: Secondary | ICD-10-CM

## 2015-06-03 DIAGNOSIS — Z006 Encounter for examination for normal comparison and control in clinical research program: Secondary | ICD-10-CM

## 2015-06-03 DIAGNOSIS — Z1211 Encounter for screening for malignant neoplasm of colon: Secondary | ICD-10-CM

## 2015-06-03 MED ORDER — INFLUENZA VAC SPLIT QUAD 0.5 ML IM SUSY
0.5000 mL | PREFILLED_SYRINGE | Freq: Once | INTRAMUSCULAR | Status: AC
  Administered 2015-06-03: 0.5 mL via INTRAMUSCULAR
  Filled 2015-06-03: qty 0.5

## 2015-06-03 NOTE — Telephone Encounter (Signed)
avs printed and appointments made,patient will call for her mammo and gi appointments and has the numbers to call    Kristin Webb

## 2015-06-03 NOTE — Progress Notes (Signed)
06/03/15 at 3:21pm- GOG 225 - month 24 on-study visit / EOT visit- The pt was into the cancer center this afternoon for her month 24 visit.  The pt had her fasting research labs drawn on 05/31/15 along with her CA.125.  The pt was given her questionnaires upon arrival to the cancer center this afternoon.  The pt was instructed to complete the questionnaires and return them to the research nurse.  The pt's height (shoeless) and weight was obtained today. The pt's waist measurement was also obtained today using the Gulick II tape measure.  The pt's waist measurement was 114 cm.  The pt has a history of a questionable abdominal hernia, and the pt's doctor is concerned about the accuracy of the pt's waist measurement.  The pt will be seen and examined today by Dr. Marko Plume.  The pt's concomitant medications and adverse events were reviewed with the pt.  The pt states that the following AE's are ongoing at the end of the study:  Insomnia, fatigue, pain-back, diarrhea, cough, and abdominal pain.  The pt said that all of these symptoms have been chronic issues for her "over the years".  The pt states that none of her AE's are related to her participation in this diet and exercise study.  The pt reports that she is no longer taking nuvigil since she is driving more these days.  She said the nuvigil makes her too sleepy to safely drive.  She also reported that she stopped Protonix because it was causing her diarrhea.  She said she stopped taking fish oil too.  She said that she felt that these medications were not helping her.  The research nurse reviewed the pt's questionnaires for accuracy and completeness.  The pt was thanked for her support during this 2 year study.  The pt was informed that she will be still be followed for reporting purposes.  The pt said that she still has 1 more coaching call.  The pt said that the calls have been "very helpful".   Brion Aliment RN, BSN, CCRP Clinical Research Nurse 06/03/2015  4:50 PM

## 2015-06-03 NOTE — Progress Notes (Signed)
OFFICE PROGRESS NOTE   June 03, 2015   Physicians:Gehrig, Paola,McNeill, Abigail Butts, MD (PCP), Dohmeier, Asencion Partridge; (Sypher, R), rheumatology (?Lula), Ashok Pall.  New patient referral to [facility].  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 She completes GOG 0225 diet and exercise study, month 24 today.  PAC has not been a problem and, with poor peripheral access, she prefers to keep this for now.  Patient has felt reasonably well since seeing Dr Alycia Rossetti 03-13-15, next visit to her recommended in 6 months.  She had diarrhea from protonix, back to usual 3x daily bowel movements since stopping that medication, with occasional acute diarrhea that can be incontinent (twice in last 5 weeks). Her usual pattern is that bowels move within an hour of eating, which makes it difficult for her to eat out. She has never had colonoscopy, but is in agreement with referral for that now.   She was recently treated for sinusitis by Dr Addison Lank, which began as probably environmental allergies before suprainfection. Allergies are worse in fall. She has chronic dry mouth, so claritin etc likely would be difficult to tolerate, but may want to use Flonase or nasacort thru fall.  She complains of new tingling in hands and feet, reports no evidence of diabetes by recent labs from Dr Addison Lank. She reads a lot, will be aware of positioning of arms with this. She tends to sit cross-legged due to chronic back problems. She denies new neck or back pain. She has visit upcoming with Dr Brett Fairy, who follows for narcolepsy, and should discuss these symptoms with her.   Last mammograms apparently were at [facility] in 02-2012; she agrees to letting me schedule these now. As last report describes dense breast tissue, I have recommended tomo  mammograms, which should also be covered by her medicare.   She has PAC, flushed today.   flu vaccine 06-03-15 Genetics testing negative spring 2015. ECOG 0-1 (chronic back and LE symptoms not related to oncologic diagnosis   She is moving to a different location in [place].  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 negatve post treatmen CT 9/14    Fallopian tube carcinoma   11/01/2012 Initial Diagnosis Fallopian tube carcinoma   11/08/2012 Surgery Debulking, optima. IA ovarian and IIIB fallopian tube   12/13/2012 - 03/28/2013 Chemotherapy s/p 6 cycles of paclitaxel and carboplatin  Patient presented in ~ Jan 2014 with complaints of abdominal distension and change in bowels. She had had unremarkable colonoscopy July 2013 (not in this EMR, and patient does not recall which MD). She is post hysterectomy in 1990s. Abdominal symptoms did not improve with interventions for constipation, and CT AP was done in [place] system 11-01-12 with finding of large mid abdominal mass. She was referred to Dr Alycia Rossetti. Preop CA 125 is not available in this EMR; preop CXR had some linear scarring in lingula without acute findings. She went to exploratory laparotomy, bilateral salpingo-oophorectomy, appendectomy, and infacolic omentectomy, which was optimal debulking, by Dr Alycia Rossetti on 11-08-2012. Operative findings: 25 cm right adnexal mass with smooth surface. Surgically  absent uterus. Atrophic-appearing left ovary. Within the omentum there were centimeter nodules scattered throughout. The remainder of the surfaces were benign. Pathology showed high grade serous carcinoma of right  fallopian tube as well as 28 cm mucinous borderline tumor of right ovary. She had PAC placed by IR and 6 cycles of taxol carbo from 12-13-12 thru 03-28-13, with neulasta support. She had negative BRCA and breast/ovarian cancer syndrome genetic screening in spring 2015         Review of systems as above, also: No fever. Only clear nasal drainage now. No lower respiratory symptoms. No HA or other neurologic symptoms other than hands and feet.  Remainder of 10 point Review of Systems negative.  Objective:  Vital signs in last 24 hours:  BP 154/91 mmHg  Pulse 107  Temp(Src) 98 F (36.7 C) (Oral)  Resp 18  Ht 5' (1.524 m)  Wt 176 lb 14.4 oz (80.241 kg)  BMI 34.55 kg/m2  SpO2 98% Weight down another 3 lbs. Alert, oriented and appropriate. Ambulatory without assistance. Looks comfortable, very pleasant as always  HEENT:PERRL, sclerae not icteric. Oral mucosa moist without lesions, posterior pharynx clear.  Neck supple. No JVD.  Lymphatics:no cervical,supraclavicular, axillary or inguinal adenopathy Resp: clear to auscultation bilaterally and normal percussion bilaterally Cardio: regular rate and rhythm. No gallop. GI: soft, nontender, large ventral laxity but not otherwise obviously distended, no mass or organomegaly. Normally active bowel sounds.  Musculoskeletal/ Extremities: without pitting edema, cords, tenderness Neuro: speech fluent, strength symmetrical UE and LE. Skin without rash, ecchymosis, petechiae Breasts: bilaterally without dominant mass, skin or nipple findings. Axillae benign. Portacath-without erythema or tenderness  Lab Results:  Results for orders placed or performed in visit on 05/31/15  CA 125  Result Value Ref Range   CA 125 7 <35 U/mL  Research Labs  Result Value Ref Range   Research Labs Collected.   CBC with Differential  Result Value Ref Range   WBC 5.5 3.9 - 10.3 10e3/uL   NEUT# 2.9 1.5 - 6.5 10e3/uL   HGB 14.4 11.6 - 15.9 g/dL   HCT 43.6 34.8 -  46.6 %   Platelets 156 145 - 400 10e3/uL   MCV 87.4 79.5 - 101.0 fL   MCH 28.8 25.1 - 34.0 pg   MCHC 33.0 31.5 - 36.0 g/dL   RBC 4.99 3.70 - 5.45 10e6/uL   RDW 13.6 11.2 - 14.5 %   lymph# 1.9 0.9 - 3.3 10e3/uL   MONO# 0.5 0.1 - 0.9 10e3/uL   Eosinophils Absolute 0.2 0.0 - 0.5 10e3/uL   Basophils Absolute 0.0 0.0 - 0.1 10e3/uL   NEUT% 52.9 38.4 - 76.8 %   LYMPH% 34.6 14.0 - 49.7 %   MONO% 8.5 0.0 - 14.0 %   EOS% 3.1 0.0 - 7.0 %   BASO% 0.9 0.0 - 2.0 %  Comprehensive metabolic panel (Cmet) - [facility]  Result Value Ref Range   Sodium 140 136 - 145 mEq/L   Potassium 4.0 3.5 - 5.1 mEq/L   Chloride 104 98 - 109 mEq/L   CO2 28 22 - 29 mEq/L   Glucose 103 70 - 140 mg/dl   BUN 17.2 7.0 - 26.0 mg/dL   Creatinine 0.7 0.6 - 1.1 mg/dL   Total Bilirubin 0.42 0.20 - 1.20 mg/dL   Alkaline Phosphatase 83 40 - 150 U/L   AST 16 5 - 34 U/L   ALT 20 0 - 55 U/L   Total Protein 6.9 6.4 - 8.3 g/dL  Albumin 4.0 3.5 - 5.0 g/dL   Calcium 10.1 8.4 - 10.4 mg/dL   Anion Gap 9 3 - 11 mEq/L   EGFR 89 (L) >90 ml/min/1.73 m2    CA 125 on 05-31-15   7   Studies/Results:  No results found.  Medications: I have reviewed the patient's current medications. Flu vaccine today. Off protonix. Try regular flonase or equivalent thru fall allergy season.  DISCUSSION: as above,  Bowel symptoms and GI referral Tomo mammograms Neuropathy symptoms hands and feet, discuss with neurologist at upcoming scheduled appointment Meds as noted Keep PAC for now, flush every 6-8 weeks when not otherwise used.  Gyn onc in Jan and I will see her coordinating with PAC flush + lab in spring 2017.  She will continue to be followed for disease progression and survival for GOG 0225.   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 Completed GOG 225 diet and exercise study Oct 2016. She will see Dr Alycia Rossetti in ~ Jan and I will see her spring  2017 with labs. 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  4.PAC in: needs flush every 6-8 weeks.  5. lumbar disc disease symptomatic with chronic LLE pain: Dr Ashok Pall has seen previously 6.elevated BP: PCP managing 7.obesity: BMI down from 38.7 to 34.6. GOG 225 diet and exercise study completed, encouraged her to continue diet and exercise 8.Narcolepsy- type disorder, fibromyalgia. On Nuvigil per Dr Brett Fairy. Sleep apnea has improved such that she no longer needs CPAP 9.negative BRCA and breast/ovarian cancer syndrome testing spring 2015 10.recent loss of her husband 11.flu vaccine 06-03-15 12.peripheral neuropathy or paresthesia symptoms new in hands and feet: to discuss with Dr Dohmeier 13.overdue mammograms, scheduled now 43.never colonoscopy and multiple bowel movements daily (not new): refer to [facility] as PCP is in [facility]    All questions answered and patient is in agreement with recommendations and plans. Time spent 25 min including >50% counseling and coordination of care. CC Dr Addison Lank, Dr Dohmeier, [facility]   Gordy Levan, MD   06/03/2015, 8:32 PM

## 2015-06-04 ENCOUNTER — Encounter: Payer: Self-pay | Admitting: Oncology

## 2015-06-04 ENCOUNTER — Telehealth: Payer: Self-pay | Admitting: Oncology

## 2015-06-04 NOTE — Telephone Encounter (Signed)
Medical Oncology  Spoke with [facility], learned that she had colonoscopy by Dr Laurence Spates in 2013.  My note sent to Dr Oletta Lamas, but with this information "new patient" referral to that office not correct.  Godfrey Pick, MD

## 2015-06-20 ENCOUNTER — Telehealth: Payer: Self-pay | Admitting: *Deleted

## 2015-06-20 NOTE — Telephone Encounter (Signed)
Received fax from [facility] that they are unable to reach patient to schedule an appt. Attempted to reach patient on contact number in EMR - left VM requesting return call.  Per Dr. Marko Plume, if able to reach patient to proceed with appt, she will need to be scheduled with Dr. Oletta Lamas at [facility] (patient already established with him).

## 2015-06-20 NOTE — Telephone Encounter (Addendum)
Called patient's emergency contact, Arbie Cookey, to get secondary contact number for patient as she is in the middle of moving and has not been answering home phone. Patient's cell phone number is [phone]. Called and spoke with patient and instructed her to call [facility] to schedule an appointment with Dr. Oletta Lamas. Patient agreeable to call and schedule appt tomorrow. No other questions or concerns noted at this time.

## 2015-06-23 DIAGNOSIS — L259 Unspecified contact dermatitis, unspecified cause: Secondary | ICD-10-CM | POA: Diagnosis not present

## 2015-06-25 NOTE — Telephone Encounter (Signed)
Spoke with [facility] 06-25-15 and Ms. Schaeffer has not called to schedule an appointment with Dr. Oletta Lamas.   Will follow up With Ms. Irene Shipper.

## 2015-06-26 DIAGNOSIS — B372 Candidiasis of skin and nail: Secondary | ICD-10-CM | POA: Diagnosis not present

## 2015-06-26 DIAGNOSIS — L237 Allergic contact dermatitis due to plants, except food: Secondary | ICD-10-CM | POA: Diagnosis not present

## 2015-06-26 NOTE — Telephone Encounter (Signed)
Reached [facility] and she stated that she has not called GI as she has a bad case of posion ivy.  She will contact them tomorrow and set up an appointment for early December.

## 2015-07-09 ENCOUNTER — Emergency Department (HOSPITAL_COMMUNITY): Payer: Commercial Managed Care - HMO

## 2015-07-09 ENCOUNTER — Encounter (HOSPITAL_COMMUNITY): Payer: Self-pay | Admitting: Emergency Medicine

## 2015-07-09 ENCOUNTER — Inpatient Hospital Stay (HOSPITAL_COMMUNITY)
Admission: EM | Admit: 2015-07-09 | Discharge: 2015-07-11 | DRG: 872 | Disposition: A | Discharge status: Home / Self Care | Payer: Commercial Managed Care - HMO | Attending: Internal Medicine | Admitting: Internal Medicine

## 2015-07-09 ENCOUNTER — Inpatient Hospital Stay (HOSPITAL_COMMUNITY): Payer: Commercial Managed Care - HMO | Admitting: Anesthesiology

## 2015-07-09 ENCOUNTER — Inpatient Hospital Stay (HOSPITAL_COMMUNITY): Payer: Commercial Managed Care - HMO

## 2015-07-09 ENCOUNTER — Encounter (HOSPITAL_COMMUNITY)
Admission: EM | Disposition: A | Discharge status: Home / Self Care | Payer: Self-pay | Source: Home / Self Care | Attending: Internal Medicine

## 2015-07-09 DIAGNOSIS — M797 Fibromyalgia: Secondary | ICD-10-CM | POA: Diagnosis present

## 2015-07-09 DIAGNOSIS — A419 Sepsis, unspecified organism: Principal | ICD-10-CM | POA: Diagnosis present

## 2015-07-09 DIAGNOSIS — Z79899 Other long term (current) drug therapy: Secondary | ICD-10-CM

## 2015-07-09 DIAGNOSIS — N200 Calculus of kidney: Secondary | ICD-10-CM

## 2015-07-09 DIAGNOSIS — E669 Obesity, unspecified: Secondary | ICD-10-CM | POA: Diagnosis present

## 2015-07-09 DIAGNOSIS — Z91030 Bee allergy status: Secondary | ICD-10-CM | POA: Diagnosis not present

## 2015-07-09 DIAGNOSIS — N390 Urinary tract infection, site not specified: Secondary | ICD-10-CM | POA: Diagnosis not present

## 2015-07-09 DIAGNOSIS — K439 Ventral hernia without obstruction or gangrene: Secondary | ICD-10-CM | POA: Diagnosis present

## 2015-07-09 DIAGNOSIS — G4733 Obstructive sleep apnea (adult) (pediatric): Secondary | ICD-10-CM | POA: Diagnosis not present

## 2015-07-09 DIAGNOSIS — Z801 Family history of malignant neoplasm of trachea, bronchus and lung: Secondary | ICD-10-CM | POA: Diagnosis not present

## 2015-07-09 DIAGNOSIS — R109 Unspecified abdominal pain: Secondary | ICD-10-CM | POA: Diagnosis present

## 2015-07-09 DIAGNOSIS — K219 Gastro-esophageal reflux disease without esophagitis: Secondary | ICD-10-CM | POA: Diagnosis present

## 2015-07-09 DIAGNOSIS — N201 Calculus of ureter: Secondary | ICD-10-CM | POA: Diagnosis not present

## 2015-07-09 DIAGNOSIS — I959 Hypotension, unspecified: Secondary | ICD-10-CM | POA: Diagnosis present

## 2015-07-09 DIAGNOSIS — Z8349 Family history of other endocrine, nutritional and metabolic diseases: Secondary | ICD-10-CM

## 2015-07-09 DIAGNOSIS — N132 Hydronephrosis with renal and ureteral calculous obstruction: Secondary | ICD-10-CM | POA: Diagnosis not present

## 2015-07-09 DIAGNOSIS — C569 Malignant neoplasm of unspecified ovary: Secondary | ICD-10-CM | POA: Diagnosis present

## 2015-07-09 DIAGNOSIS — R011 Cardiac murmur, unspecified: Secondary | ICD-10-CM | POA: Diagnosis present

## 2015-07-09 DIAGNOSIS — N202 Calculus of kidney with calculus of ureter: Secondary | ICD-10-CM | POA: Diagnosis not present

## 2015-07-09 DIAGNOSIS — G8929 Other chronic pain: Secondary | ICD-10-CM | POA: Diagnosis present

## 2015-07-09 DIAGNOSIS — N12 Tubulo-interstitial nephritis, not specified as acute or chronic: Secondary | ICD-10-CM | POA: Diagnosis not present

## 2015-07-09 DIAGNOSIS — Z8701 Personal history of pneumonia (recurrent): Secondary | ICD-10-CM | POA: Diagnosis not present

## 2015-07-09 DIAGNOSIS — Z8544 Personal history of malignant neoplasm of other female genital organs: Secondary | ICD-10-CM

## 2015-07-09 DIAGNOSIS — Z888 Allergy status to other drugs, medicaments and biological substances status: Secondary | ICD-10-CM | POA: Diagnosis not present

## 2015-07-09 DIAGNOSIS — Z6834 Body mass index (BMI) 34.0-34.9, adult: Secondary | ICD-10-CM

## 2015-07-09 DIAGNOSIS — Z90710 Acquired absence of both cervix and uterus: Secondary | ICD-10-CM

## 2015-07-09 DIAGNOSIS — E785 Hyperlipidemia, unspecified: Secondary | ICD-10-CM | POA: Diagnosis present

## 2015-07-09 DIAGNOSIS — Z820 Family history of epilepsy and other diseases of the nervous system: Secondary | ICD-10-CM

## 2015-07-09 DIAGNOSIS — Z9049 Acquired absence of other specified parts of digestive tract: Secondary | ICD-10-CM | POA: Diagnosis not present

## 2015-07-09 DIAGNOSIS — N2889 Other specified disorders of kidney and ureter: Secondary | ICD-10-CM | POA: Diagnosis not present

## 2015-07-09 DIAGNOSIS — I10 Essential (primary) hypertension: Secondary | ICD-10-CM | POA: Diagnosis not present

## 2015-07-09 DIAGNOSIS — R112 Nausea with vomiting, unspecified: Secondary | ICD-10-CM | POA: Diagnosis not present

## 2015-07-09 HISTORY — PX: CYSTOSCOPY W/ URETERAL STENT PLACEMENT: SHX1429

## 2015-07-09 LAB — URINE MICROSCOPIC-ADD ON

## 2015-07-09 LAB — CBC WITH DIFFERENTIAL/PLATELET
Basophils Absolute: 0 10*3/uL (ref 0.0–0.1)
Basophils Relative: 0 %
Eosinophils Absolute: 0 10*3/uL (ref 0.0–0.7)
Eosinophils Relative: 0 %
HCT: 45.7 % (ref 36.0–46.0)
Hemoglobin: 15 g/dL (ref 12.0–15.0)
Lymphocytes Relative: 4 %
Lymphs Abs: 0.4 10*3/uL — ABNORMAL LOW (ref 0.7–4.0)
MCH: 29.3 pg (ref 26.0–34.0)
MCHC: 32.8 g/dL (ref 30.0–36.0)
MCV: 89.3 fL (ref 78.0–100.0)
Monocytes Absolute: 0.3 10*3/uL (ref 0.1–1.0)
Monocytes Relative: 2 %
Neutro Abs: 11.3 10*3/uL — ABNORMAL HIGH (ref 1.7–7.7)
Neutrophils Relative %: 94 %
Platelets: 128 10*3/uL — ABNORMAL LOW (ref 150–400)
RBC: 5.12 MIL/uL — ABNORMAL HIGH (ref 3.87–5.11)
RDW: 13.6 % (ref 11.5–15.5)
WBC: 12 10*3/uL — ABNORMAL HIGH (ref 4.0–10.5)

## 2015-07-09 LAB — URINALYSIS, ROUTINE W REFLEX MICROSCOPIC
Bilirubin Urine: NEGATIVE
Glucose, UA: NEGATIVE mg/dL
Ketones, ur: 15 mg/dL — AB
Nitrite: POSITIVE — AB
Protein, ur: 30 mg/dL — AB
Specific Gravity, Urine: 1.046 — ABNORMAL HIGH (ref 1.005–1.030)
pH: 6.5 (ref 5.0–8.0)

## 2015-07-09 LAB — COMPREHENSIVE METABOLIC PANEL
ALT: 24 U/L (ref 14–54)
AST: 29 U/L (ref 15–41)
Albumin: 4 g/dL (ref 3.5–5.0)
Alkaline Phosphatase: 90 U/L (ref 38–126)
Anion gap: 12 (ref 5–15)
BUN: 13 mg/dL (ref 6–20)
CO2: 25 mmol/L (ref 22–32)
Calcium: 9.4 mg/dL (ref 8.9–10.3)
Chloride: 102 mmol/L (ref 101–111)
Creatinine, Ser: 0.91 mg/dL (ref 0.44–1.00)
GFR calc Af Amer: >60 mL/min (ref >60)
GFR calc non Af Amer: >60 mL/min (ref >60)
Glucose, Bld: 161 mg/dL — ABNORMAL HIGH (ref 65–99)
Potassium: 3.5 mmol/L (ref 3.5–5.1)
Sodium: 139 mmol/L (ref 135–145)
Total Bilirubin: 0.9 mg/dL (ref 0.3–1.2)
Total Protein: 6.9 g/dL (ref 6.5–8.1)

## 2015-07-09 LAB — I-STAT CG4 LACTIC ACID, ED
Lactic Acid, Venous: 3 mmol/L (ref 0.5–2.0)
Lactic Acid, Venous: 0.89 mmol/L (ref 0.5–2.0)

## 2015-07-09 LAB — LIPASE, BLOOD: Lipase: 23 U/L (ref 11–51)

## 2015-07-09 SURGERY — CYSTOSCOPY, WITH RETROGRADE PYELOGRAM AND URETERAL STENT INSERTION
Anesthesia: General | Site: Ureter | Laterality: Left

## 2015-07-09 MED ORDER — SODIUM CHLORIDE 0.9 % IV SOLN
INTRAVENOUS | Status: DC
  Administered 2015-07-09 – 2015-07-11 (×2): via INTRAVENOUS

## 2015-07-09 MED ORDER — ZOLPIDEM TARTRATE 5 MG PO TABS: 5.0000 mg | ORAL_TABLET | Freq: Every evening | ORAL | Status: DC | PRN

## 2015-07-09 MED ORDER — ONDANSETRON 4 MG PO TBDP
8.0000 mg | ORAL_TABLET | Freq: Once | ORAL | Status: AC
  Administered 2015-07-09: 8 mg via ORAL
  Filled 2015-07-09: qty 2

## 2015-07-09 MED ORDER — FENTANYL CITRATE (PF) 100 MCG/2ML IJ SOLN
25.0000 ug | INTRAMUSCULAR | Status: DC | PRN
Start: 2015-07-09 — End: 2015-07-09

## 2015-07-09 MED ORDER — FLUOXETINE HCL 10 MG PO CAPS
10.0000 mg | ORAL_CAPSULE | Freq: Every day | ORAL | Status: DC
  Administered 2015-07-10 – 2015-07-11 (×2): 10 mg via ORAL
  Filled 2015-07-09 (×2): qty 1

## 2015-07-09 MED ORDER — ADULT MULTIVITAMIN W/MINERALS CH
1.0000 | ORAL_TABLET | Freq: Every day | ORAL | Status: DC
  Administered 2015-07-10 – 2015-07-11 (×2): 1 via ORAL
  Filled 2015-07-09 (×2): qty 1

## 2015-07-09 MED ORDER — ENOXAPARIN SODIUM 30 MG/0.3ML ~~LOC~~ SOLN
30.0000 mg | SUBCUTANEOUS | Status: DC
  Administered 2015-07-09: 30 mg via SUBCUTANEOUS
  Filled 2015-07-09: qty 0.3

## 2015-07-09 MED ORDER — LACTATED RINGERS IV SOLN: INTRAVENOUS | Status: DC

## 2015-07-09 MED ORDER — LACTATED RINGERS IV SOLN
INTRAVENOUS | Status: DC | PRN
  Administered 2015-07-09: 19:00:00 via INTRAVENOUS

## 2015-07-09 MED ORDER — FENTANYL CITRATE (PF) 100 MCG/2ML IJ SOLN
INTRAMUSCULAR | Status: DC | PRN
  Administered 2015-07-09: 50 ug via INTRAVENOUS

## 2015-07-09 MED ORDER — BELLADONNA ALKALOIDS-OPIUM 16.2-60 MG RE SUPP
RECTAL | Status: DC | PRN
  Administered 2015-07-09: 1 via RECTAL

## 2015-07-09 MED ORDER — SODIUM CHLORIDE 0.9 % IV BOLUS (SEPSIS)
1000.0000 mL | Freq: Once | INTRAVENOUS | Status: AC
  Administered 2015-07-09: 1000 mL via INTRAVENOUS

## 2015-07-09 MED ORDER — LIDOCAINE HCL (CARDIAC) 20 MG/ML IV SOLN
INTRAVENOUS | Status: DC | PRN
  Administered 2015-07-09: 100 mg via INTRAVENOUS

## 2015-07-09 MED ORDER — DEXTROSE 5 % IV SOLN
1.0000 g | Freq: Once | INTRAVENOUS | Status: AC
  Administered 2015-07-09: 1 g via INTRAVENOUS
  Filled 2015-07-09: qty 10

## 2015-07-09 MED ORDER — ACETAMINOPHEN 650 MG RE SUPP: 650.0000 mg | Freq: Four times a day (QID) | RECTAL | Status: DC | PRN

## 2015-07-09 MED ORDER — PROPOFOL 10 MG/ML IV BOLUS
INTRAVENOUS | Status: DC | PRN
  Administered 2015-07-09: 200 mg via INTRAVENOUS

## 2015-07-09 MED ORDER — ONDANSETRON HCL 4 MG/2ML IJ SOLN: 4.0000 mg | Freq: Four times a day (QID) | INTRAMUSCULAR | Status: DC | PRN

## 2015-07-09 MED ORDER — FLUOXETINE HCL 20 MG PO CAPS
20.0000 mg | ORAL_CAPSULE | Freq: Every day | ORAL | Status: DC
  Administered 2015-07-10 – 2015-07-11 (×2): 20 mg via ORAL
  Filled 2015-07-09 (×2): qty 1

## 2015-07-09 MED ORDER — IOHEXOL 300 MG/ML  SOLN
INTRAMUSCULAR | Status: DC | PRN
  Administered 2015-07-09: 7 mL

## 2015-07-09 MED ORDER — MORPHINE SULFATE (PF) 4 MG/ML IV SOLN
4.0000 mg | Freq: Once | INTRAVENOUS | Status: AC
  Administered 2015-07-09: 4 mg via INTRAVENOUS
  Filled 2015-07-09: qty 1

## 2015-07-09 MED ORDER — HYDROCODONE-ACETAMINOPHEN 5-325 MG PO TABS
1.0000 | ORAL_TABLET | ORAL | Status: DC | PRN
  Administered 2015-07-10 (×2): 1 via ORAL
  Filled 2015-07-09 (×2): qty 1

## 2015-07-09 MED ORDER — ONDANSETRON HCL 4 MG/2ML IJ SOLN
4.0000 mg | Freq: Once | INTRAMUSCULAR | Status: AC
  Administered 2015-07-09: 4 mg via INTRAVENOUS
  Filled 2015-07-09: qty 2

## 2015-07-09 MED ORDER — ACETAMINOPHEN 325 MG PO TABS
650.0000 mg | ORAL_TABLET | Freq: Four times a day (QID) | ORAL | Status: DC | PRN
  Administered 2015-07-10: 650 mg via ORAL
  Filled 2015-07-09: qty 2

## 2015-07-09 MED ORDER — MORPHINE SULFATE (PF) 2 MG/ML IV SOLN: 1.0000 mg | INTRAVENOUS | Status: DC | PRN

## 2015-07-09 MED ORDER — BELLADONNA ALKALOIDS-OPIUM 16.2-60 MG RE SUPP
RECTAL | Status: AC
  Filled 2015-07-09: qty 1

## 2015-07-09 MED ORDER — DEXTROSE 5 % IV SOLN
1.0000 g | INTRAVENOUS | Status: DC
  Administered 2015-07-09: 1 g via INTRAVENOUS
  Filled 2015-07-09: qty 10

## 2015-07-09 MED ORDER — 0.9 % SODIUM CHLORIDE (POUR BTL) OPTIME
TOPICAL | Status: DC | PRN
  Administered 2015-07-09: 1000 mL

## 2015-07-09 MED ORDER — IOHEXOL 300 MG/ML  SOLN
100.0000 mL | Freq: Once | INTRAMUSCULAR | Status: AC | PRN
  Administered 2015-07-09: 100 mL via INTRAVENOUS

## 2015-07-09 MED ORDER — ONDANSETRON HCL 4 MG/2ML IJ SOLN
INTRAMUSCULAR | Status: DC | PRN
  Administered 2015-07-09: 4 mg via INTRAVENOUS

## 2015-07-09 MED ORDER — ONDANSETRON HCL 4 MG PO TABS: 4.0000 mg | ORAL_TABLET | Freq: Four times a day (QID) | ORAL | Status: DC | PRN

## 2015-07-09 SURGICAL SUPPLY — 14 items
BAG URO CATCHER STRL LF (DRAPE) ×2 IMPLANT
CATH INTERMIT  6FR 70CM (CATHETERS) ×2 IMPLANT
CLOTH BEACON ORANGE TIMEOUT ST (SAFETY) ×2 IMPLANT
GLOVE BIOGEL PI IND STRL 7.0 (GLOVE) ×1 IMPLANT
GLOVE BIOGEL PI INDICATOR 7.0 (GLOVE) ×1
GLOVE SURG SS PI 6.5 STRL IVOR (GLOVE) ×2 IMPLANT
GLOVE SURG SS PI 7.0 STRL IVOR (GLOVE) ×2 IMPLANT
GLOVE SURG SS PI 7.5 STRL IVOR (GLOVE) ×2 IMPLANT
GOWN STRL REUS W/TWL LRG LVL3 (GOWN DISPOSABLE) ×6 IMPLANT
GUIDEWIRE STR DUAL SENSOR (WIRE) ×2 IMPLANT
MANIFOLD NEPTUNE II (INSTRUMENTS) ×2 IMPLANT
PACK CYSTO (CUSTOM PROCEDURE TRAY) ×2 IMPLANT
STENT URET 6FRX24 CONTOUR (STENTS) ×2 IMPLANT
TUBING CONNECTING 10 (TUBING) ×2 IMPLANT

## 2015-07-09 NOTE — ED Provider Notes (Signed)
CSN: [id number]     Arrival date & time 07/09/15  [id number] History   First MD Initiated Contact with Patient 07/09/15 [phone]     Chief Complaint  Patient presents with  . Generalized Body Aches  . Vomiting  . Abdominal Pain   (Consider location/radiation/quality/duration/timing/severity/associated sxs/prior Treatment)  Patient is a 69 y.o. female presenting with abdominal pain.  Abdominal Pain Associated symptoms: chills, diarrhea, nausea and vomiting   Associated symptoms: no dysuria, no fever and no hematuria     Patient is a 69 year old female with history of ovarian cancer, hysterectomy, cholecystectomy, and appendectomy who presents with 1 days of bilious emesis. Patient reports that she ate a pack of crackers and 2 egg rolls last night when she soon after had 2 episodes of diarrhea. This was followed by several episodes of bilious emesis. Patient also reports LLQ and epigastric abdominal pain. She tried tylenol for the pain which did not help. Patient has chronic diarrhea at baseline, which usually occurs about 30 minutes after eating food, but says that the two episodes yesterday were different than normal. No bloody stools. No hematemesis. She endorses chronic chills, but no fevers.   Past Medical History  Diagnosis Date  . Pelvic mass in female   . Constipation   . Diarrhea     in the past after gallbladder removal  . Hyperlipidemia   . Burning mouth syndrome   . Chronic fatigue   . Fibromyalgia   . Lumbar disc disease   . PONV (postoperative nausea and vomiting)   . Hypertension     borderline not on meds   . Shortness of breath     with exertion   . Sleep apnea     CPAP settings at 13   . Pneumonia     hx of pneumonia   . GERD (gastroesophageal reflux disease)   . Yeast infection   . Ovarian cancer ([place]) 2014   Past Surgical History  Procedure Laterality Date  . Cholecystectomy      early 66s  . Abdominal hysterectomy      early 1990s  . Trigger finger release     . Laparotomy Bilateral 11/08/2012    Procedure: EXPLORATORY LAPAROTOMY TOTAL ABDOMINAL HYSTERECTOMY BILATERAL SALPINGO OOPHORECTOMY TUMOR DEBULKING ;  Surgeon: Imagene Gurney A. Alycia Rossetti, MD;  Location: [facility];  Service: Gynecology;  Laterality: Bilateral;  APPENDECTOMY / OMENTECTOMY  . Appendectomy    . Exploratory laparotomy  11/08/12    BSO, appendectomy, omentectomy   Family History  Problem Relation Age of Onset  . Lung cancer Mother 90  . Lung cancer Father 45    2 ppd smoker  . High blood pressure Mother   . High Cholesterol Mother   . Parkinson's disease Father   . Kidney disease     Social History  Substance Use Topics  . Smoking status: Never Smoker   . Smokeless tobacco: Never Used  . Alcohol Use: No   OB History    No data available     Review of Systems  Constitutional: Positive for chills. Negative for fever.  HENT: Negative.   Eyes: Negative.   Respiratory: Negative.   Cardiovascular: Negative.   Gastrointestinal: Positive for nausea, vomiting, abdominal pain and diarrhea.  Endocrine: Negative.   Genitourinary: Negative for dysuria and hematuria.  Musculoskeletal: Negative.   Skin: Negative.   Neurological: Negative.   Hematological: Negative.   Psychiatric/Behavioral: Negative.       Allergies  Bee venom and Cortisone  Home Medications   Prior to Admission medications   Medication Sig Start Date End Date Taking? Authorizing Provider  ACETAMINOPHEN PO Take 1-2 tablets by mouth every 8 (eight) hours as needed (pain or headache).   Yes Historical Provider, MD  Calcium Citrate-Vitamin D (CALCIUM CITRATE + D PO) Take 1 capsule by mouth daily.    Yes Historical Provider, MD  diphenhydrAMINE (BENADRYL) 25 mg capsule Take 50 mg by mouth at bedtime.    Yes Historical Provider, MD  EPINEPHrine (EPIPEN 2-PAK) 0.3 mg/0.3 mL DEVI Inject 0.3 mg into the muscle once. For bee stings   Yes Historical Provider, MD  eszopiclone (LUNESTA) 2 MG TABS tablet Take 1 tablet (2 mg  total) by mouth at bedtime as needed for sleep. Take immediately before bedtime Patient taking differently: Take 2 mg by mouth at bedtime. Take immediately before bedtime 12/24/14  Yes Carmen Dohmeier, MD  FLUoxetine (PROZAC) 10 MG capsule Take 10 mg by mouth daily.    Yes Historical Provider, MD  FLUoxetine (PROZAC) 20 MG capsule Take 20 mg by mouth daily. Take with 10mg  tablet for total dose of 30mg  daily 07/12/14  Yes Historical Provider, MD  ibuprofen (ADVIL,MOTRIN) 200 MG tablet Take 200 mg by mouth every 6 (six) hours as needed for pain.   Yes Historical Provider, MD  ketoconazole (NIZORAL) 2 % cream Apply 1 application topically 2 (two) times daily as needed for irritation (yeast infection).   Yes Historical Provider, MD  Multiple Vitamin (MULTIVITAMIN WITH MINERALS) TABS Take 1 tablet by mouth daily.   Yes Historical Provider, MD  nystatin (MYCOSTATIN/NYSTOP) 100000 UNIT/GM POWD Apply 1 g topically 2 (two) times daily as needed (yeast infection).   Yes Historical Provider, MD  rosuvastatin (CRESTOR) 10 MG tablet Take 10 mg by mouth daily.   Yes Historical Provider, MD  triamcinolone cream (KENALOG) 0.5 % Apply 1 application topically 2 (two) times daily as needed (poison ivy).   Yes Historical Provider, MD  triamterene-hydrochlorothiazide (MAXZIDE-25) 37.5-25 MG per tablet Take 1 tablet by mouth daily. 06/05/13  Yes Lennis Marion Downer, MD  Armodafinil (NUVIGIL) 250 MG tablet Take 1 tablet (250 mg total) by mouth daily. Patient not taking: Reported on 06/03/2015 06/14/14   Philmore Pali, NP   BP 100/75 mmHg  Pulse 97  Temp(Src) 99 F (37.2 C) (Oral)  Resp 21  SpO2 94% Physical Exam  Constitutional: She is oriented to person, place, and time. She appears well-developed and well-nourished.  HENT:  Head: Normocephalic and atraumatic.  Eyes: EOM are normal. Pupils are equal, round, and reactive to light.  Neck: Normal range of motion. Neck supple.  Cardiovascular: Normal rate, regular rhythm and  normal heart sounds.   Pulmonary/Chest: Effort normal and breath sounds normal. No respiratory distress. She has no wheezes.  Abdominal: Soft. Bowel sounds are normal. She exhibits distension. She exhibits no mass. There is tenderness in the epigastric area and left lower quadrant. There is no rebound, no guarding, no CVA tenderness and negative Murphy's sign.  Abdomen tympanic to percussion  Musculoskeletal: Normal range of motion.  Neurological: She is alert and oriented to person, place, and time. No cranial nerve deficit.  Skin: Skin is warm and dry. No erythema.  Psychiatric: She has a normal mood and affect. Her behavior is normal.  Nursing note and vitals reviewed.   ED Course  Procedures (including critical care time) Labs Review Labs Reviewed  URINALYSIS, ROUTINE W REFLEX MICROSCOPIC (NOT AT [facility]) - Abnormal; Notable for the following:  APPearance TURBID (*)    Specific Gravity, Urine 1.046 (*)    Hgb urine dipstick LARGE (*)    Ketones, ur 15 (*)    Protein, ur 30 (*)    Nitrite POSITIVE (*)    Leukocytes, UA LARGE (*)    All other components within normal limits  CBC WITH DIFFERENTIAL/PLATELET - Abnormal; Notable for the following:    WBC 12.0 (*)    RBC 5.12 (*)    Platelets 128 (*)    Neutro Abs 11.3 (*)    Lymphs Abs 0.4 (*)    All other components within normal limits  COMPREHENSIVE METABOLIC PANEL - Abnormal; Notable for the following:    Glucose, Bld 161 (*)    All other components within normal limits  URINE MICROSCOPIC-ADD ON - Abnormal; Notable for the following:    Squamous Epithelial / LPF 0-5 (*)    Bacteria, UA MANY (*)    All other components within normal limits  I-STAT CG4 LACTIC ACID, ED - Abnormal; Notable for the following:    Lactic Acid, Venous 3.00 (*)    All other components within normal limits  URINE CULTURE  CULTURE, BLOOD (ROUTINE X 2)  CULTURE, BLOOD (ROUTINE X 2)  LIPASE, BLOOD  I-STAT CG4 LACTIC ACID, ED    Imaging Review Ct  Abdomen Pelvis W Contrast  07/09/2015  CLINICAL DATA:  68 year old female with recent history of lower abdominal pain common nausea and vomiting since yesterday. History of ovarian cancer status post hysterectomy and bilateral salpingo oophorectomy. EXAM: CT ABDOMEN AND PELVIS WITH CONTRAST TECHNIQUE: Multidetector CT imaging of the abdomen and pelvis was performed using the standard protocol following bolus administration of intravenous contrast. CONTRAST:  148mL OMNIPAQUE IOHEXOL 300 MG/ML  SOLN COMPARISON:  CT the abdomen and pelvis 04/24/2013. FINDINGS: Lower chest:  Unremarkable. Hepatobiliary: No cystic or solid hepatic lesions. No intra or extrahepatic biliary ductal dilatation. Status post cholecystectomy. Pancreas: No pancreatic mass. No pancreatic ductal dilatation. No pancreatic or peripancreatic fluid or inflammatory changes. Spleen: Unremarkable. Adrenals/Urinary Tract: 12 mm obstructive calculus at the left ureteropelvic junction with moderate proximal with hydronephrosis and perinephric stranding. No additional calculi are noted in the right renal collecting system, along the course of either ureter or within the lumen of the urinary bladder. Sub cm low-attenuation lesion in the upper pole of the left kidney is too small to definitively characterize, but statistically favored to represent a tiny cyst. 9 mm fatty attenuation lesion in the interpolar region of the right kidney is compatible with a tiny angiomyolipoma. Tiny locule of gas in the nondependent portion of the urinary bladder, presumably iatrogenic. Urinary bladder is otherwise normal in appearance. Bilateral adrenal glands are normal in appearance. Stomach/Bowel: Normal appearance of the stomach. No pathologic dilatation of small bowel or colon. A short portion of the mid transverse colon extends into a small supraumbilical ventral hernia. There is also a short segment of the mid small bowel which extends into a smaller ventral hernia  immediately inferior to this, but also supraumbilical in location. The appendix is not confidently identified, and likely surgically absent. Regardless, no inflammatory changes are noted adjacent to the cecum to suggest presence of an acute appendicitis at this time. Vascular/Lymphatic: No significant atherosclerotic disease, aneurysm or dissection identified in the abdominal or pelvic vasculature. No lymphadenopathy noted in the abdomen or pelvis. Multiple reactive sized retroperitoneal lymph nodes are incidentally noted. Reproductive: Status post total abdominal hysterectomy and bilateral salpingo-oophorectomy. No unexpected pelvic mass identified. Other: No  significant volume of ascites.  No pneumoperitoneum. Musculoskeletal: There are no aggressive appearing lytic or blastic lesions noted in the visualized portions of the skeleton. IMPRESSION: 1. 12 mm obstructive calculus at the left ureteropelvic junction with moderate proximal hydronephrosis. 2. 2 small supraumbilical ventral hernias, one containing a short segment of the mid transverse colon and the other containing a short segment of the mid small bowel. There is no associated evidence to suggest bowel incarceration or obstruction at this time. 3. Tiny locule of gas non dependently in the lumen of the urinary bladder. This is presumably iatrogenic related to recent catheterization for urinalysis. Alternatively, this could be seen in the setting of urinary tract infection with gas-forming organisms. Clinical correlation for history of recent catheterization is recommended. 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 5. Status post cholecystectomy. 6. Additional incidental findings, as above. Electronically Signed   By: Vinnie Langton M.D.   On: 07/09/2015 13:04   I have personally reviewed and evaluated these images and lab results as part of my medical decision-making.   EKG Interpretation None      MDM   Final diagnoses:   Nephrolithiasis  UTI (lower urinary tract infection)    Patient is a 69 year old female with history of ovarian cancer, hysterectomy, cholecystectomy, and appendectomy presenting with 1 days of bilious emesis. Physical exam notable for distended, tympanic abdomen with epigastric and LLQ pain. No rebound or guarding. Lab work notable for elevated lactate to 3.00 elevated WBC to 12.0. UA grossly infected. CT abdomen notable for 62mm stone at left UPJ and gas in the urinary bladder. Lactate improved to 0.89 after 2L NS. One gram of ceftriaxone given in ED. Blood and urine cultures obtained. Discussed case with Dr Louis Meckel, anticipate operative management later this evening. Discussed case with hospitalist, will see patient and admit to [facility].   Vivi Barrack, MD 07/09/15 [id number]  Ezequiel Essex, MD 07/09/15 [phone]

## 2015-07-09 NOTE — H&P (Signed)
I have been asked to see the patient by Dr. Niel Hummer, for evaluation and management of left proximal ureteral stone with associated pyelonephritis and a urinalysis concerning for infection.Marland Kitchen  History of present illness: 69 year old female who presented to the emergency department after approximate 24 hours of left flank pain and left lower quadrant pain with associated nausea, vomiting, and diarrhea. The patient states that she had subjective fevers at home. Her pain was fairly acute in onset and is poorly controlled. In addition, the patient has dysuria and worsening frequency and urgency. The patient has no history of kidney stones in the past.  In the emergency department the patient was noted to have an elevated white blood cell count to 12.0. Her renal function was normal appearing. Her lactate was 3. Her urinalysis demonstrated nitrite positive  pyuria.She subsequently underwent CT scan which demonstrated a large 12 mm stone located in the left proximal ureter with associated proximal hydronephrosis. There was thickening of the ureteral lining as well as a delayed nephrogram and findings consistent with pyelonephritis. 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 The patient was slightly tachycardic but afebrile with stable vital signs.  Review of systems: A 12 point comprehensive review of systems was obtained and is negative unless otherwise stated in the history of present illness.  Patient Active Problem List   Diagnosis Date Noted  . Pyelonephritis 07/09/2015  . Nephrolithiasis 07/09/2015  . Insomnia secondary to depression with anxiety 12/24/2014  . Cancer of ovary ([place]) 12/20/2014  . Nausea without vomiting 12/20/2014  . Gastritis 12/20/2014  . Port catheter in place 12/20/2014  . Insomnia 12/12/2013  . Obesity (BMI 30-39.9) 12/12/2013  . OSA (obstructive sleep apnea) 11/24/2013  . Disseminated ovarian cancer ([place]) 11/24/2013  . 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 Ovarian cancer ([place])   . Ventral hernia 04/18/2013  . Obesity 04/18/2013  . Hypertension 04/18/2013  . 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 Ovarian ca ([place]) 11/17/2012  . Ovarian cyst 11/02/2012    No current facility-administered medications on file prior to encounter.   Current Outpatient Prescriptions on File Prior to Encounter  Medication Sig Dispense Refill  . Calcium Citrate-Vitamin D (CALCIUM CITRATE + D PO) Take 1 capsule by mouth daily.     . diphenhydrAMINE (BENADRYL) 25 mg capsule Take 50 mg by mouth at bedtime.     Marland Kitchen EPINEPHrine (EPIPEN 2-PAK) 0.3 mg/0.3 mL DEVI Inject 0.3 mg into the muscle once. For bee stings    . eszopiclone (LUNESTA) 2 MG TABS tablet Take 1 tablet (2 mg total) by mouth at bedtime as needed for sleep. Take immediately before bedtime (Patient taking differently: Take 2 mg by mouth at bedtime. Take immediately before bedtime) 30 tablet 5  . FLUoxetine (PROZAC) 10 MG capsule Take 10 mg by mouth daily.     Marland Kitchen FLUoxetine (PROZAC) 20 MG capsule Take 20 mg by mouth daily. Take with 10mg  tablet for total dose of 30mg  daily    . ibuprofen (ADVIL,MOTRIN) 200 MG tablet Take 200 mg by mouth every 6 (six) hours as needed for pain.    . Multiple Vitamin (MULTIVITAMIN WITH MINERALS) TABS Take 1 tablet by mouth daily.    Marland Kitchen triamterene-hydrochlorothiazide (MAXZIDE-25) 37.5-25 MG per tablet Take 1 tablet by mouth daily. 30 tablet 1  . Armodafinil (NUVIGIL) 250 MG tablet Take 1 tablet (250 mg total) by mouth daily. (Patient not taking: Reported on 06/03/2015) 30 tablet 5    Past Medical  History  Diagnosis Date  . Pelvic mass in female   . Constipation   . Diarrhea     in the past after gallbladder removal  . Hyperlipidemia   . Burning mouth syndrome   . Chronic fatigue   . Fibromyalgia   . Lumbar disc disease   . PONV (postoperative nausea and vomiting)   . Hypertension     borderline not on meds   . Shortness of breath     with  exertion   . Sleep apnea     CPAP settings at 12   . Pneumonia     hx of pneumonia   . GERD (gastroesophageal reflux disease)   . Yeast infection   . Ovarian cancer ([place]) 2014    Past Surgical History  Procedure Laterality Date  . Cholecystectomy      early 81s  . Abdominal hysterectomy      early 1990s  . Trigger finger release    . Laparotomy Bilateral 11/08/2012    Procedure: EXPLORATORY LAPAROTOMY TOTAL ABDOMINAL HYSTERECTOMY BILATERAL SALPINGO OOPHORECTOMY TUMOR DEBULKING ;  Surgeon: Imagene Gurney A. Alycia Rossetti, MD;  Location: [facility];  Service: Gynecology;  Laterality: Bilateral;  APPENDECTOMY / OMENTECTOMY  . Appendectomy    . Exploratory laparotomy  11/08/12    BSO, appendectomy, omentectomy    Social History  Substance Use Topics  . Smoking status: Never Smoker   . Smokeless tobacco: Never Used  . Alcohol Use: No    Family History  Problem Relation Age of Onset  . Lung cancer Mother 31  . Lung cancer Father 32    2 ppd smoker  . High blood pressure Mother   . High Cholesterol Mother   . Parkinson's disease Father   . Kidney disease      PE: Filed Vitals:   07/09/15 1336 07/09/15 1345 07/09/15 1430 07/09/15 1600  BP: 100/75 100/61 104/86 107/65  Pulse: 97 96 95   Temp:      TempSrc:      Resp: 21 22 23 23   SpO2: 94% 98% 100%    Patient appears to be in no acute distress  patient is alert and oriented x3 Atraumatic normocephalic head No cervical or supraclavicular lymphadenopathy appreciated No increased work of breathing, no audible wheezes/rhonchi Regular sinus rhythm/rate Abdomen is soft, nontender, nondistended, no CVA or suprapubic tenderness Lower extremities are symmetric without appreciable edema Grossly neurologically intact No identifiable skin lesions   Recent Labs  07/09/15 0941  WBC 12.0*  HGB 15.0  HCT 45.7    Recent Labs  07/09/15 0941  NA 139  K 3.5  CL 102  CO2 25  GLUCOSE 161*  BUN 13  CREATININE 0.91  CALCIUM 9.4   No  results for input(s): LABPT, INR in the last 72 hours. No results for input(s): LABURIN in the last 72 hours. Results for orders placed or performed during the hospital encounter of 07/09/15  Blood culture (routine x 2)     Status: None (Preliminary result)   Collection Time: 07/09/15  3:25 PM  Result Value Ref Range Status   Specimen Description BLOOD LEFT FOREARM  Final   Special Requests   Final    BOTTLES DRAWN AEROBIC AND ANAEROBIC 4CC PUR AND 5CC BLUE   Culture PENDING  Incomplete   Report Status PENDING  Incomplete  Blood culture (routine x 2)     Status: None (Preliminary result)   Collection Time: 07/09/15  3:28 PM  Result Value Ref Range Status  Specimen Description BLOOD LEFT HAND  Final   Special Requests IN PEDIATRIC BOTTLE 3CC  Final   Culture PENDING  Incomplete   Report Status PENDING  Incomplete    Imaging: I've independently reviewed the patient's CT scan performed in the emergency department which demonstrated a 12 mm stone in the left proximal ureter with associated ureteral thickening/enhancement concerning for infection. There was a delayed nephrogram which suggests high-grade obstruction on the left. There is also a hypodense area within the parenchyma of the left kidney concerning for pyelonephritis. There is a small speck of air within the bladder which may reflect emphysematous cystitis.   Imp: The patient has a large left proximal ureteral stone with evidence concerning for infection in the urinary tract with impending septic physiology.   Recommendations: Having seen the CT scan as well as the patient's lab, recommended the patient be transferred to [facility] [place] long so that she can be urgently taken to the operating room and a left ureteral stent placed. She subsequently will be admitted to the [facility] service. She should be treated with broad-spectrum antibiotics until the urine culture returns and then narrowed to an oral agent which she  should be on for a total of 14 days. Once her infection clears and she begins to feel better we can schedule her for more definitive stone management with options including shockwave lithotripsy or ureteroscopy.  Thank you for involving me in this patient's care, I will continue to follow along.  Louis Meckel W

## 2015-07-09 NOTE — Transfer of Care (Signed)
Immediate Anesthesia Transfer of Care Note  Patient: Kristin Webb  Procedure(s) Performed: Procedure(s): CYSTOSCOPY WITH RETROGRADE PYELOGRAM/URETERAL STENT PLACEMENT (Left)  Patient Location: PACU  Anesthesia Type:General  Level of Consciousness: awake, alert , oriented and patient cooperative  Airway & Oxygen Therapy: Patient Spontanous Breathing and Patient connected to face mask oxygen  Post-op Assessment: Report given to RN, Post -op Vital signs reviewed and stable and Patient moving all extremities  Post vital signs: Reviewed and stable  Last Vitals:  Filed Vitals:   07/09/15 1600 07/09/15 1745  BP: 107/65 105/59  Pulse:  98  Temp:  37.6 C  Resp: 23 20    Complications: No apparent anesthesia complications

## 2015-07-09 NOTE — Anesthesia Preprocedure Evaluation (Addendum)
Anesthesia Evaluation  Patient identified by MRN, date of birth, ID band Patient awake    Reviewed: Allergy & Precautions, H&P , NPO status , Patient's Chart, lab work & pertinent test results  History of Anesthesia Complications (+) PONV and history of anesthetic complications  Airway Mallampati: II  TM Distance: >3 FB Neck ROM: Full    Dental  (+) Dental Advisory Given, Teeth Intact   Pulmonary shortness of breath and with exertion, sleep apnea and Continuous Positive Airway Pressure Ventilation , pneumonia, resolved,    Pulmonary exam normal breath sounds clear to auscultation       Cardiovascular hypertension, Pt. on medications Normal cardiovascular exam Rhythm:Regular     Neuro/Psych PSYCHIATRIC DISORDERS Anxiety Burning mouth syndrome negative neurological ROS  negative psych ROS   GI/Hepatic Neg liver ROS, GERD  Medicated,  Endo/Other  negative endocrine ROS  Renal/GU negative Renal ROS     Musculoskeletal negative musculoskeletal ROS (+) Fibromyalgia -  Abdominal   Peds  Hematology negative hematology ROS (+)   Anesthesia Other Findings   Reproductive/Obstetrics Ovarian cancer                            Anesthesia Physical Anesthesia Plan  ASA: III  Anesthesia Plan: General   Post-op Pain Management:    Induction: Intravenous  Airway Management Planned: LMA  Additional Equipment:   Intra-op Plan:   Post-operative Plan:   Informed Consent:   Plan Discussed with: Surgeon  Anesthesia Plan Comments:         Anesthesia Quick Evaluation

## 2015-07-09 NOTE — H&P (Signed)
[facility] History and Physical  Kristin Webb [id number] DOB: 06-Jan-1946 DOA: 07/09/2015  Referring physician:  PCP: Cari Caraway, MD   Chief Complaint: abdominal pain, right flank pain.   HPI: Kristin Webb is a 69 y.o. female with PMH significant for ovarian cancer who presents complaining of lower quadrant abdominal pain, left side flank pain, nausea and vomiting that started day prior to admission.   Evaluation in the ED; 12 mm obstructive calculus at the lft ureteropelvic junction with moderated hydronephrosis, lactic acid acid at 3, wbc at 12, UA with positive nitrates, WBC with too numerous to count.     Review of Systems:  Negative, except as per HPI  Past Medical History  Diagnosis Date  . Pelvic mass in female   . Constipation   . Diarrhea     in the past after gallbladder removal  . Hyperlipidemia   . Burning mouth syndrome   . Chronic fatigue   . Fibromyalgia   . Lumbar disc disease   . PONV (postoperative nausea and vomiting)   . Hypertension     borderline not on meds   . Shortness of breath     with exertion   . Sleep apnea     CPAP settings at 12   . Pneumonia     hx of pneumonia   . GERD (gastroesophageal reflux disease)   . Yeast infection   . Ovarian cancer ([place]) 2014   Past Surgical History  Procedure Laterality Date  . Cholecystectomy      early 34s  . Abdominal hysterectomy      early 1990s  . Trigger finger release    . Laparotomy Bilateral 11/08/2012    Procedure: EXPLORATORY LAPAROTOMY TOTAL ABDOMINAL HYSTERECTOMY BILATERAL SALPINGO OOPHORECTOMY TUMOR DEBULKING ;  Surgeon: Imagene Gurney A. Alycia Rossetti, MD;  Location: [facility];  Service: Gynecology;  Laterality: Bilateral;  APPENDECTOMY / OMENTECTOMY  . Appendectomy    . Exploratory laparotomy  11/08/12    BSO, appendectomy, omentectomy   Social History:  reports that she has never smoked. She has never used smokeless tobacco. She reports that she does not drink alcohol or use illicit  drugs.  Allergies  Allergen Reactions  . Bee Venom Hives    Difficulty breathing, carries an EPI-pen  . Cortisone     Injected cortisone, "sick to my stomach, throwing up, hand swelling, and pain."    Family History  Problem Relation Age of Onset  . Lung cancer Mother 56  . Lung cancer Father 60    2 ppd smoker  . High blood pressure Mother   . High Cholesterol Mother   . Parkinson's disease Father   . Kidney disease      Prior to Admission medications   Medication Sig Start Date End Date Taking? Authorizing Provider  ACETAMINOPHEN PO Take 1-2 tablets by mouth every 8 (eight) hours as needed (pain or headache).   Yes Historical Provider, MD  Calcium Citrate-Vitamin D (CALCIUM CITRATE + D PO) Take 1 capsule by mouth daily.    Yes Historical Provider, MD  diphenhydrAMINE (BENADRYL) 25 mg capsule Take 50 mg by mouth at bedtime.    Yes Historical Provider, MD  EPINEPHrine (EPIPEN 2-PAK) 0.3 mg/0.3 mL DEVI Inject 0.3 mg into the muscle once. For bee stings   Yes Historical Provider, MD  eszopiclone (LUNESTA) 2 MG TABS tablet Take 1 tablet (2 mg total) by mouth at bedtime as needed for sleep. Take immediately before bedtime Patient taking differently: Take 2 mg by mouth  at bedtime. Take immediately before bedtime 12/24/14  Yes Carmen Dohmeier, MD  FLUoxetine (PROZAC) 10 MG capsule Take 10 mg by mouth daily.    Yes Historical Provider, MD  FLUoxetine (PROZAC) 20 MG capsule Take 20 mg by mouth daily. Take with 10mg  tablet for total dose of 30mg  daily 07/12/14  Yes Historical Provider, MD  ibuprofen (ADVIL,MOTRIN) 200 MG tablet Take 200 mg by mouth every 6 (six) hours as needed for pain.   Yes Historical Provider, MD  ketoconazole (NIZORAL) 2 % cream Apply 1 application topically 2 (two) times daily as needed for irritation (yeast infection).   Yes Historical Provider, MD  Multiple Vitamin (MULTIVITAMIN WITH MINERALS) TABS Take 1 tablet by mouth daily.   Yes Historical Provider, MD  nystatin  (MYCOSTATIN/NYSTOP) 100000 UNIT/GM POWD Apply 1 g topically 2 (two) times daily as needed (yeast infection).   Yes Historical Provider, MD  rosuvastatin (CRESTOR) 10 MG tablet Take 10 mg by mouth daily.   Yes Historical Provider, MD  triamcinolone cream (KENALOG) 0.5 % Apply 1 application topically 2 (two) times daily as needed (poison ivy).   Yes Historical Provider, MD  triamterene-hydrochlorothiazide (MAXZIDE-25) 37.5-25 MG per tablet Take 1 tablet by mouth daily. 06/05/13  Yes Lennis Marion Downer, MD  Armodafinil (NUVIGIL) 250 MG tablet Take 1 tablet (250 mg total) by mouth daily. Patient not taking: Reported on 06/03/2015 06/14/14   Philmore Pali, NP   Physical Exam: Filed Vitals:   07/09/15 1215 07/09/15 1245 07/09/15 1300 07/09/15 1336  BP: 97/81 102/63 105/71 100/75  Pulse: 102 100 97 97  Temp:      TempSrc:      Resp: 17 19 18 21   SpO2: 95% 92% 92% 94%    Wt Readings from Last 3 Encounters:  06/03/15 80.241 kg (176 lb 14.4 oz)  03/13/15 80.287 kg (177 lb)  12/24/14 80.513 kg (177 lb 8 oz)    General:  Appears calm and comfortable Eyes: PERRL, normal lids, irises & conjunctiva ENT: grossly normal hearing, lips & tongue Neck: no LAD, masses or thyromegaly Cardiovascular: RRR, no m/r/g. No LE edema. Telemetry: SR, no arrhythmias  Respiratory: CTA bilaterally, no w/r/r. Normal respiratory effort. Abdomen: soft, ntnd Skin: no rash or induration seen on limited exam Musculoskeletal: grossly normal tone BUE/BLE Psychiatric: grossly normal mood and affect, speech fluent and appropriate Neurologic: grossly non-focal.          Labs on Admission:  Basic Metabolic Panel:  Recent Labs Lab 07/09/15 0941  NA 139  K 3.5  CL 102  CO2 25  GLUCOSE 161*  BUN 13  CREATININE 0.91  CALCIUM 9.4   Liver Function Tests:  Recent Labs Lab 07/09/15 0941  AST 29  ALT 24  ALKPHOS 90  BILITOT 0.9  PROT 6.9  ALBUMIN 4.0    Recent Labs Lab 07/09/15 0941  LIPASE 23   No results  for input(s): AMMONIA in the last 168 hours. CBC:  Recent Labs Lab 07/09/15 0941  WBC 12.0*  NEUTROABS 11.3*  HGB 15.0  HCT 45.7  MCV 89.3  PLT 128*   Cardiac Enzymes: No results for input(s): CKTOTAL, CKMB, CKMBINDEX, TROPONINI in the last 168 hours.  BNP (last 3 results) No results for input(s): BNP in the last 8760 hours.  ProBNP (last 3 results) No results for input(s): PROBNP in the last 8760 hours.  CBG: No results for input(s): GLUCAP in the last 168 hours.  Radiological Exams on Admission: Ct Abdomen Pelvis W Contrast  07/09/2015  CLINICAL DATA:  68 year old female with recent history of lower abdominal pain common nausea and vomiting since yesterday. History of ovarian cancer status post hysterectomy and bilateral salpingo oophorectomy. EXAM: CT ABDOMEN AND PELVIS WITH CONTRAST TECHNIQUE: Multidetector CT imaging of the abdomen and pelvis was performed using the standard protocol following bolus administration of intravenous contrast. CONTRAST:  129mL OMNIPAQUE IOHEXOL 300 MG/ML  SOLN COMPARISON:  CT the abdomen and pelvis 04/24/2013. FINDINGS: Lower chest:  Unremarkable. Hepatobiliary: No cystic or solid hepatic lesions. No intra or extrahepatic biliary ductal dilatation. Status post cholecystectomy. Pancreas: No pancreatic mass. No pancreatic ductal dilatation. No pancreatic or peripancreatic fluid or inflammatory changes. Spleen: Unremarkable. Adrenals/Urinary Tract: 12 mm obstructive calculus at the left ureteropelvic junction with moderate proximal with hydronephrosis and perinephric stranding. No additional calculi are noted in the right renal collecting system, along the course of either ureter or within the lumen of the urinary bladder. Sub cm low-attenuation lesion in the upper pole of the left kidney is too small to definitively characterize, but statistically favored to represent a tiny cyst. 9 mm fatty attenuation lesion in the interpolar region of the right kidney is  compatible with a tiny angiomyolipoma. Tiny locule of gas in the nondependent portion of the urinary bladder, presumably iatrogenic. Urinary bladder is otherwise normal in appearance. Bilateral adrenal glands are normal in appearance. Stomach/Bowel: Normal appearance of the stomach. No pathologic dilatation of small bowel or colon. A short portion of the mid transverse colon extends into a small supraumbilical ventral hernia. There is also a short segment of the mid small bowel which extends into a smaller ventral hernia immediately inferior to this, but also supraumbilical in location. The appendix is not confidently identified, and likely surgically absent. Regardless, no inflammatory changes are noted adjacent to the cecum to suggest presence of an acute appendicitis at this time. Vascular/Lymphatic: No significant atherosclerotic disease, aneurysm or dissection identified in the abdominal or pelvic vasculature. No lymphadenopathy noted in the abdomen or pelvis. Multiple reactive sized retroperitoneal lymph nodes are incidentally noted. Reproductive: Status post total abdominal hysterectomy and bilateral salpingo-oophorectomy. No unexpected pelvic mass identified. Other: No significant volume of ascites.  No pneumoperitoneum. Musculoskeletal: There are no aggressive appearing lytic or blastic lesions noted in the visualized portions of the skeleton. IMPRESSION: 1. 12 mm obstructive calculus at the left ureteropelvic junction with moderate proximal hydronephrosis. 2. 2 small supraumbilical ventral hernias, one containing a short segment of the mid transverse colon and the other containing a short segment of the mid small bowel. There is no associated evidence to suggest bowel incarceration or obstruction at this time. 3. Tiny locule of gas non dependently in the lumen of the urinary bladder. This is presumably iatrogenic related to recent catheterization for urinalysis. Alternatively, this could be seen in the  setting of urinary tract infection with gas-forming organisms. Clinical correlation for history of recent catheterization is recommended. 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 5. Status post cholecystectomy. 6. Additional incidental findings, as above. Electronically Signed   By: Vinnie Langton M.D.   On: 07/09/2015 13:04    EKG: Independently reviewed. Sinus rhythm.   Assessment/Plan Active Problems:   Ovarian ca [facility])   Pyelonephritis   Nephrolithiasis  1-Pyelonephritis, left ureteropelvic stone.  IV ceftriaxone, IV fluids.  Urology will see patient in consultation, plan to take to OR today for sten placement.  Follow blood culture, urine culture.   2-Early sepsis:  Presents with mild hypotension SBP 97/81,  leukocytosis, mild elevated lactic acid.  IV fluids, IV ceftriaxone.   History of ovarian cancer.   Code Status: Full code.  DVT Prophylaxis: SCD Family Communication: care discussed with patient.  Disposition Plan: expect   Time spent: 75 minutes.   Niel Hummer A [facility] Pager [phone]

## 2015-07-09 NOTE — Progress Notes (Signed)
67F with left proximal ureteral stone with evidence of pyelonephritis and infected urine.  Spoke with ED attending and will plan to transfer to [facility] hospital for admission to hospitalist service and proceed to OR for urgent left ureteral stent placement.  Patient at risk for sepsis and hemodynamic instability.

## 2015-07-09 NOTE — Anesthesia Procedure Notes (Signed)
Procedure Name: LMA Insertion Date/Time: 07/09/2015 7:18 PM Performed by: Carleene Cooper A Pre-anesthesia Checklist: Patient identified, Emergency Drugs available, Suction available, Patient being monitored and Timeout performed Patient Re-evaluated:Patient Re-evaluated prior to inductionOxygen Delivery Method: Circle system utilized Preoxygenation: Pre-oxygenation with 100% oxygen Intubation Type: IV induction Ventilation: Mask ventilation without difficulty LMA: LMA with gastric port inserted LMA Size: 4.0 Number of attempts: 1 Placement Confirmation: positive ETCO2 and breath sounds checked- equal and bilateral Tube secured with: Tape Dental Injury: Teeth and Oropharynx as per pre-operative assessment

## 2015-07-09 NOTE — ED Notes (Signed)
Pt sts lower abd pain with radiation and right pain in left flank area with N/V/D x 2 days; pt sts chills

## 2015-07-09 NOTE — Op Note (Addendum)
Preoperative diagnosis:  1. Left obstructing stone   Postoperative diagnosis:  1. same   Procedure:  1. Cystoscopy 2. left ureteral stent placement 3. left retrograde pyelography with interpretation   Surgeon: Ardis Hughs, MD Assistant: Dr. Harvel Ricks, MD  Anesthesia: General  Complications: None  Intraoperative findings: 10cc of Omnipaque contrast was instilled into the left ureter demonstrating a left proximal filling defect with proximal ureterohydronephrosis, mild nephrotic drip, mildly infected appearing urine.  EBL: Minimal  Specimens: None  Indication: Kristin Webb is a 69 y.o. patient with left infected/obstructed proximal ureteral stone. After reviewing the management options for treatment, he elected to proceed with the above surgical procedure(s). We have discussed the potential benefits and risks of the procedure, side effects of the proposed treatment, the likelihood of the patient achieving the goals of the procedure, and any potential problems that might occur during the procedure or recuperation. Informed consent has been obtained.  Description of procedure:  The patient was taken to the operating room and general anesthesia was induced.  The patient was placed in the dorsal lithotomy position, prepped and draped in the usual sterile fashion, and preoperative antibiotics were administered. A preoperative time-out was performed.   Cystourethroscopy was performed.  The patient's urethra was examined and was normal. The bladder was then systematically examined in its entirety. There was no evidence for any bladder tumors, stones, or other mucosal pathology.    Attention then turned to the leftureteral orifice and a ureteral catheter was used to intubate the ureteral orifice.  Omnipaque contrast was injected through the ureteral catheter and a retrograde pyelogram was performed with findings as dictated above.  A 0.38 sensor guidewire was then advanced up  the left ureter into the renal pelvis under fluoroscopic guidance.  The wire was then backloaded through the cystoscope and a ureteral stent was advance over the wire using Seldinger technique.  The stent was positioned appropriately under fluoroscopic and cystoscopic guidance.  The wire was then removed with an adequate stent curl noted in the renal pelvis as well as in the bladder.  The bladder was then emptied and the procedure ended.  The patient appeared to tolerate the procedure well and without complications.  The patient was able to be awakened and transferred to the recovery unit in satisfactory condition.    Ardis Hughs, M.D.

## 2015-07-09 NOTE — Anesthesia Postprocedure Evaluation (Signed)
Anesthesia Post Note  Patient: Kristin Webb  Procedure(s) Performed: Procedure(s) (LRB): CYSTOSCOPY WITH LEFT RETROGRADE PYELOGRAM/ LEFT URETERAL STENT PLACEMENT (Left)  Patient location during evaluation: PACU Anesthesia Type: General Level of consciousness: awake and alert Pain management: pain level controlled Vital Signs Assessment: post-procedure vital signs reviewed and stable Respiratory status: spontaneous breathing, nonlabored ventilation, respiratory function stable and patient connected to nasal cannula oxygen Cardiovascular status: blood pressure returned to baseline and stable Postop Assessment: no signs of nausea or vomiting Anesthetic complications: no    Last Vitals:  Filed Vitals:   07/09/15 2015 07/09/15 2030  BP: 117/84 110/64  Pulse: 95 90  Temp: 36.9 C 37.1 C  Resp: 20 20    Last Pain:  Filed Vitals:   07/09/15 2051  PainSc: 0-No pain                 Langley Flatley L

## 2015-07-10 ENCOUNTER — Encounter (HOSPITAL_COMMUNITY): Payer: Self-pay | Admitting: Urology

## 2015-07-10 DIAGNOSIS — N12 Tubulo-interstitial nephritis, not specified as acute or chronic: Secondary | ICD-10-CM

## 2015-07-10 DIAGNOSIS — C569 Malignant neoplasm of unspecified ovary: Secondary | ICD-10-CM

## 2015-07-10 DIAGNOSIS — A419 Sepsis, unspecified organism: Principal | ICD-10-CM

## 2015-07-10 DIAGNOSIS — N201 Calculus of ureter: Secondary | ICD-10-CM

## 2015-07-10 LAB — BASIC METABOLIC PANEL
Anion gap: 7 (ref 5–15)
BUN: 12 mg/dL (ref 6–20)
CO2: 28 mmol/L (ref 22–32)
Calcium: 8 mg/dL — ABNORMAL LOW (ref 8.9–10.3)
Chloride: 101 mmol/L (ref 101–111)
Creatinine, Ser: 0.76 mg/dL (ref 0.44–1.00)
GFR calc Af Amer: >60 mL/min (ref >60)
GFR calc non Af Amer: >60 mL/min (ref >60)
Glucose, Bld: 114 mg/dL — ABNORMAL HIGH (ref 65–99)
Potassium: 3 mmol/L — ABNORMAL LOW (ref 3.5–5.1)
Sodium: 136 mmol/L (ref 135–145)

## 2015-07-10 LAB — CBC
HCT: 35.7 % — ABNORMAL LOW (ref 36.0–46.0)
Hemoglobin: 11.5 g/dL — ABNORMAL LOW (ref 12.0–15.0)
MCH: 29.3 pg (ref 26.0–34.0)
MCHC: 32.2 g/dL (ref 30.0–36.0)
MCV: 91.1 fL (ref 78.0–100.0)
Platelets: 106 10*3/uL — ABNORMAL LOW (ref 150–400)
RBC: 3.92 MIL/uL (ref 3.87–5.11)
RDW: 14.3 % (ref 11.5–15.5)
WBC: 9.6 10*3/uL (ref 4.0–10.5)

## 2015-07-10 LAB — URINE CULTURE

## 2015-07-10 MED ORDER — SENNOSIDES-DOCUSATE SODIUM 8.6-50 MG PO TABS
1.0000 | ORAL_TABLET | Freq: Two times a day (BID) | ORAL | Status: DC
  Administered 2015-07-10 – 2015-07-11 (×2): 1 via ORAL
  Filled 2015-07-10 (×2): qty 1

## 2015-07-10 MED ORDER — ENOXAPARIN SODIUM 40 MG/0.4ML ~~LOC~~ SOLN
40.0000 mg | SUBCUTANEOUS | Status: DC
  Administered 2015-07-10: 40 mg via SUBCUTANEOUS
  Filled 2015-07-10 (×2): qty 0.4

## 2015-07-10 MED ORDER — DIPHENHYDRAMINE HCL 25 MG PO CAPS
25.0000 mg | ORAL_CAPSULE | Freq: Every day | ORAL | Status: DC
  Administered 2015-07-10: 25 mg via ORAL
  Filled 2015-07-10 (×2): qty 1

## 2015-07-10 MED ORDER — POLYETHYLENE GLYCOL 3350 17 G PO PACK
17.0000 g | PACK | Freq: Once | ORAL | Status: AC
  Administered 2015-07-10: 17 g via ORAL
  Filled 2015-07-10: qty 1

## 2015-07-10 MED ORDER — FAMOTIDINE 20 MG PO TABS
20.0000 mg | ORAL_TABLET | Freq: Two times a day (BID) | ORAL | Status: DC
  Administered 2015-07-10 – 2015-07-11 (×2): 20 mg via ORAL
  Filled 2015-07-10 (×3): qty 1

## 2015-07-10 MED ORDER — CETYLPYRIDINIUM CHLORIDE 0.05 % MT LIQD
7.0000 mL | Freq: Two times a day (BID) | OROMUCOSAL | Status: DC
  Administered 2015-07-10 – 2015-07-11 (×2): 7 mL via OROMUCOSAL

## 2015-07-10 MED ORDER — POTASSIUM CHLORIDE CRYS ER 20 MEQ PO TBCR
40.0000 meq | EXTENDED_RELEASE_TABLET | Freq: Once | ORAL | Status: AC
  Administered 2015-07-10: 40 meq via ORAL
  Filled 2015-07-10: qty 2

## 2015-07-10 MED ORDER — CIPROFLOXACIN HCL 500 MG PO TABS
500.0000 mg | ORAL_TABLET | Freq: Two times a day (BID) | ORAL | Status: DC
  Administered 2015-07-10 – 2015-07-11 (×2): 500 mg via ORAL
  Filled 2015-07-10 (×3): qty 1

## 2015-07-10 NOTE — Progress Notes (Signed)
PROGRESS NOTE  Kristin Webb [id number] DOB: September 10, 1945 DOA: 07/09/2015 PCP: Cari Caraway, MD  HPI/Recap of past 24 hours:  Reported feeling better, still no appetite, but denies pain, no n/v.  Assessment/Plan: Active Problems:   Ovarian ca [facility])   Pyelonephritis   Nephrolithiasis  1-Pyelonephritis, left ureteropelvic stone.  Started on IV ceftriaxone, IV fluids from admission  s/p sten placement on 11/29 Follow blood culture, urine culture.  abx changed to oral cipro on 11/30 per urology recommendation.  2-Early sepsis:  Presents with mild hypotension SBP 97/81, leukocytosis, elevated lactic acid at 3.  IV fluids, IV ceftriaxone.  Improving, culture no growth, abx changed to oral cipro , continue ivf.  History of ovarian cancer. Last ca 125 done in 05/2015 was 7. Closed followed on outpatient basis.  Code Status: full  Family Communication: patient   Disposition Plan: likely home soon if fever free for >24hrs   Consultants:  urology  Procedures: On 11/29: 1. Cystoscopy 2. left ureteral stent placement 3. left retrograde pyelography with interpretation  Antibiotics:  Rocephin from admission to 11/30  Oral cipro from 11/30 per urology recommendation   Objective: BP 108/56 mmHg  Pulse 87  Temp(Src) 101.7 F (38.7 C) (Oral)  Resp 18  Ht 5' (1.524 m)  Wt 176 lb 2.4 oz (79.9 kg)  BMI 34.40 kg/m2  SpO2 95%  Intake/Output Summary (Last 24 hours) at 07/10/15 1710 Last data filed at 07/10/15 1400  Gross per 24 hour  Intake 2518.33 ml  Output    675 ml  Net 1843.33 ml   Filed Weights   07/09/15 2051  Weight: 176 lb 2.4 oz (79.9 kg)    Exam:   General:  NAD  Cardiovascular: RRR  Respiratory: CTABL  Abdomen: Soft/ND/NT, positive BS  Musculoskeletal: No Edema  Neuro: aaox3  Skin: scatter healing skin rash (reported from recent poison ivy), mild erythema sacral gluteal skin fold, no obvious skin breakdown.  Data Reviewed: Basic  Metabolic Panel:  Recent Labs Lab 07/09/15 0941 07/10/15 0510  NA 139 136  K 3.5 3.0*  CL 102 101  CO2 25 28  GLUCOSE 161* 114*  BUN 13 12  CREATININE 0.91 0.76  CALCIUM 9.4 8.0*   Liver Function Tests:  Recent Labs Lab 07/09/15 0941  AST 29  ALT 24  ALKPHOS 90  BILITOT 0.9  PROT 6.9  ALBUMIN 4.0    Recent Labs Lab 07/09/15 0941  LIPASE 23   No results for input(s): AMMONIA in the last 168 hours. CBC:  Recent Labs Lab 07/09/15 0941 07/10/15 0510  WBC 12.0* 9.6  NEUTROABS 11.3*  --   HGB 15.0 11.5*  HCT 45.7 35.7*  MCV 89.3 91.1  PLT 128* 106*   Cardiac Enzymes:   No results for input(s): CKTOTAL, CKMB, CKMBINDEX, TROPONINI in the last 168 hours. BNP (last 3 results) No results for input(s): BNP in the last 8760 hours.  ProBNP (last 3 results) No results for input(s): PROBNP in the last 8760 hours.  CBG: No results for input(s): GLUCAP in the last 168 hours.  Recent Results (from the past 240 hour(s))  Urine culture     Status: None   Collection Time: 07/09/15  1:26 PM  Result Value Ref Range Status   Specimen Description URINE, CLEAN CATCH  Final   Special Requests NONE  Final   Culture MULTIPLE SPECIES PRESENT, SUGGEST RECOLLECTION  Final   Report Status 07/10/2015 FINAL  Final  Blood culture (routine x 2)  Status: None (Preliminary result)   Collection Time: 07/09/15  3:25 PM  Result Value Ref Range Status   Specimen Description BLOOD LEFT FOREARM  Final   Special Requests   Final    BOTTLES DRAWN AEROBIC AND ANAEROBIC 4CC PUR AND 5CC BLUE   Culture NO GROWTH < 24 HOURS  Final   Report Status PENDING  Incomplete  Blood culture (routine x 2)     Status: None (Preliminary result)   Collection Time: 07/09/15  3:28 PM  Result Value Ref Range Status   Specimen Description BLOOD LEFT HAND  Final   Special Requests IN PEDIATRIC BOTTLE 3CC  Final   Culture NO GROWTH < 24 HOURS  Final   Report Status PENDING  Incomplete     Studies: Dg  Retrograde Pyelogram  07/10/2015  CLINICAL DATA:  Kidney stone. EXAM: RETROGRADE PYELOGRAM COMPARISON:  CT 07/09/2015 FINDINGS: Contrast is noted in the left renal collecting system. A stone is noted in the left ureterovesical junction as noted on prior CT. Left ureteral stent has been placed, its tip scar in the upper left renal pelvis and bladder. Radiation dose 13.5 mGy. IMPRESSION: 1. Left ureteral pelvic junction stone again noted. 2.  Left ureteral stent in good anatomic position . Electronically Signed   By: [place]   On: 07/10/2015 07:16    Scheduled Meds: . antiseptic oral rinse  7 mL Mouth Rinse BID  . ciprofloxacin  500 mg Oral BID  . diphenhydrAMINE  25 mg Oral QHS  . enoxaparin (LOVENOX) injection  40 mg Subcutaneous Q24H  . famotidine  20 mg Oral BID  . FLUoxetine  10 mg Oral Daily  . FLUoxetine  20 mg Oral Daily  . multivitamin with minerals  1 tablet Oral Daily  . polyethylene glycol  17 g Oral Once  . senna-docusate  1 tablet Oral BID    Continuous Infusions: . sodium chloride 100 mL/hr at 07/09/15 2337     Time spent: 66mins  Chapman Matteucci MD, PhD  [facility] Pager [phone]. If 7PM-7AM, please contact night-coverage at [email or web address], password [facility] 07/10/2015, 5:10 PM  LOS: 1 day

## 2015-07-10 NOTE — Progress Notes (Signed)
Urology Inpatient Progress Report Nephrolithiasis [N20.0] UTI (lower urinary tract infection) [N39.0] 07/09/2015 19mm left UPJ stone, status post cystoscopy, left ureteral stent placement for concern of impending sepsis from her obstructing stone.  Intv/Subj: No acute events overnight. Patient is without complaint.  Past Medical History  Diagnosis Date  . Pelvic mass in female   . Constipation   . Diarrhea     in the past after gallbladder removal  . Hyperlipidemia   . Burning mouth syndrome   . Chronic fatigue   . Fibromyalgia   . Lumbar disc disease   . PONV (postoperative nausea and vomiting)   . Hypertension     borderline not on meds   . Shortness of breath     with exertion   . Sleep apnea     CPAP settings at 12   . Pneumonia     hx of pneumonia   . GERD (gastroesophageal reflux disease)   . Yeast infection   . Ovarian cancer ([place]) 2014   Current Facility-Administered Medications  Medication Dose Route Frequency Provider Last Rate Last Dose  . 0.9 %  sodium chloride infusion   Intravenous Continuous Belkys A Regalado, MD 100 mL/hr at 07/09/15 2337    . acetaminophen (TYLENOL) tablet 650 mg  650 mg Oral Q6H PRN Belkys A Regalado, MD       Or  . acetaminophen (TYLENOL) suppository 650 mg  650 mg Rectal Q6H PRN Belkys A Regalado, MD      . antiseptic oral rinse (CPC / CETYLPYRIDINIUM CHLORIDE 0.05%) solution 7 mL  7 mL Mouth Rinse BID Belkys A Regalado, MD   7 mL at 07/10/15 0914  . cefTRIAXone (ROCEPHIN) 1 g in dextrose 5 % 50 mL IVPB  1 g Intravenous Q24H Belkys A Regalado, MD   1 g at 07/09/15 2337  . enoxaparin (LOVENOX) injection 30 mg  30 mg Subcutaneous Q24H Belkys A Regalado, MD   30 mg at 07/09/15 2337  . FLUoxetine (PROZAC) capsule 10 mg  10 mg Oral Daily Belkys A Regalado, MD   10 mg at 07/10/15 0915  . FLUoxetine (PROZAC) capsule 20 mg  20 mg Oral Daily Belkys A Regalado, MD   20 mg at 07/10/15 0915  . HYDROcodone-acetaminophen (NORCO/VICODIN) 5-325 MG per  tablet 1-2 tablet  1-2 tablet Oral Q4H PRN Belkys A Regalado, MD      . morphine 2 MG/ML injection 1 mg  1 mg Intravenous Q4H PRN Belkys A Regalado, MD      . multivitamin with minerals tablet 1 tablet  1 tablet Oral Daily Belkys A Regalado, MD   1 tablet at 07/10/15 0915  . ondansetron (ZOFRAN) tablet 4 mg  4 mg Oral Q6H PRN Belkys A Regalado, MD       Or  . ondansetron (ZOFRAN) injection 4 mg  4 mg Intravenous Q6H PRN Belkys A Regalado, MD      . zolpidem (AMBIEN) tablet 5 mg  5 mg Oral QHS PRN Belkys A Regalado, MD         Objective: Vital: Filed Vitals:   07/09/15 2030 07/09/15 2051 07/10/15 0359 07/10/15 0532  BP: 110/64  104/57 100/61  Pulse: 90  79 83  Temp: 98.8 F (37.1 C)  98.9 F (37.2 C) 98.3 F (36.8 C)  TempSrc:   Oral Oral  Resp: 20  16 20   Height:  5' (1.524 m)    Weight:  79.9 kg (176 lb 2.4 oz)    SpO2: 99%  98% 93%   I/Os: I/O last 3 completed shifts: In: 1338.3 [I.V.:1288.3; IV Piggyback:50] Out: [id number] [Urine:675]  Physical Exam:  General: Patient is in no apparent distress Lungs: Normal respiratory effort, chest expands symmetrically. GI: The abdomen is soft and nontender without mass. Ext: lower extremities symmetric  Lab Results:  Recent Labs  07/09/15 0941 07/10/15 0510  WBC 12.0* 9.6  HGB 15.0 11.5*  HCT 45.7 35.7*    Recent Labs  07/09/15 0941 07/10/15 0510  NA 139 136  K 3.5 3.0*  CL 102 101  CO2 25 28  GLUCOSE 161* 114*  BUN 13 12  CREATININE 0.91 0.76  CALCIUM 9.4 8.0*   No results for input(s): LABPT, INR in the last 72 hours. No results for input(s): LABURIN in the last 72 hours. Results for orders placed or performed during the hospital encounter of 07/09/15  Urine culture     Status: None   Collection Time: 07/09/15  1:26 PM  Result Value Ref Range Status   Specimen Description URINE, CLEAN CATCH  Final   Special Requests NONE  Final   Culture MULTIPLE SPECIES PRESENT, SUGGEST RECOLLECTION  Final   Report Status  07/10/2015 FINAL  Final  Blood culture (routine x 2)     Status: None (Preliminary result)   Collection Time: 07/09/15  3:25 PM  Result Value Ref Range Status   Specimen Description BLOOD LEFT FOREARM  Final   Special Requests   Final    BOTTLES DRAWN AEROBIC AND ANAEROBIC 4CC PUR AND 5CC BLUE   Culture PENDING  Incomplete   Report Status PENDING  Incomplete  Blood culture (routine x 2)     Status: None (Preliminary result)   Collection Time: 07/09/15  3:28 PM  Result Value Ref Range Status   Specimen Description BLOOD LEFT HAND  Final   Special Requests IN PEDIATRIC BOTTLE 3CC  Final   Culture PENDING  Incomplete   Report Status PENDING  Incomplete    Studies/Results:   Assessment: 1 Day Post-Op The patient today feels significantly better. Her only pain is chronic low back pain.She's not had any fevers overnight and her vital signs of been stable.the patient's urine culture appears to be a contaminated specimen. Plan: At this point, I would recommend the patient be transitioned to oral antibiotics, ciprofloxacin or something similar would be ideal. If she tolerates the antibiotic and she is afebrile tomorrow morning, I would expect that she will be ready for discharge. I will plan to follow-up the patient in clinic in the next 10-14 days to repeat a urine culture prior to scheduling her for any definitive stone surgery.  I will sign off at this point.  Cc: Dr Evlyn Clines Cc: Dr. Tylene Fantasia W 07/10/2015, 12:48 PM

## 2015-07-10 NOTE — Care Management Note (Signed)
Case Management Note  Patient Details  Name: Kristin Webb MRN: [id number] Date of Birth: [birth year]  Subjective/Objective:  69 y/o f admitted w/Pyelonephritis. POD#1 cysto/L ureteral stent.From home.                  Action/Plan:d/c plan home.   Expected Discharge Date:                  Expected Discharge Plan:  Home/Self Care  In-House Referral:     Discharge planning Services  CM Consult  Post Acute Care Choice:    Choice offered to:     DME Arranged:    DME Agency:     HH Arranged:    HH Agency:     Status of Service:  In process, will continue to follow  Medicare Important Message Given:    Date Medicare IM Given:    Medicare IM give by:    Date Additional Medicare IM Given:    Additional Medicare Important Message give by:     If discussed at [place] of Stay Meetings, dates discussed:    Additional Comments:  Dessa Phi, RN 07/10/2015, 1:58 PM

## 2015-07-11 ENCOUNTER — Inpatient Hospital Stay (HOSPITAL_COMMUNITY): Payer: Commercial Managed Care - HMO

## 2015-07-11 DIAGNOSIS — Z87442 Personal history of urinary calculi: Secondary | ICD-10-CM

## 2015-07-11 DIAGNOSIS — N200 Calculus of kidney: Secondary | ICD-10-CM

## 2015-07-11 HISTORY — DX: Personal history of urinary calculi: Z87.442

## 2015-07-11 LAB — CBC
HCT: 35.5 % — ABNORMAL LOW (ref 36.0–46.0)
Hemoglobin: 11.4 g/dL — ABNORMAL LOW (ref 12.0–15.0)
MCH: 29.4 pg (ref 26.0–34.0)
MCHC: 32.1 g/dL (ref 30.0–36.0)
MCV: 91.5 fL (ref 78.0–100.0)
Platelets: 99 10*3/uL — ABNORMAL LOW (ref 150–400)
RBC: 3.88 MIL/uL (ref 3.87–5.11)
RDW: 14.2 % (ref 11.5–15.5)
WBC: 6.4 10*3/uL (ref 4.0–10.5)

## 2015-07-11 LAB — BASIC METABOLIC PANEL
Anion gap: 5 (ref 5–15)
BUN: 11 mg/dL (ref 6–20)
CO2: 29 mmol/L (ref 22–32)
Calcium: 8.4 mg/dL — ABNORMAL LOW (ref 8.9–10.3)
Chloride: 108 mmol/L (ref 101–111)
Creatinine, Ser: 0.67 mg/dL (ref 0.44–1.00)
GFR calc Af Amer: >60 mL/min (ref >60)
GFR calc non Af Amer: >60 mL/min (ref >60)
Glucose, Bld: 98 mg/dL (ref 65–99)
Potassium: 4 mmol/L (ref 3.5–5.1)
Sodium: 142 mmol/L (ref 135–145)

## 2015-07-11 LAB — MAGNESIUM: Magnesium: 1.7 mg/dL (ref 1.7–2.4)

## 2015-07-11 LAB — GLUCOSE, CAPILLARY: Glucose-Capillary: 92 mg/dL (ref 65–99)

## 2015-07-11 LAB — LACTIC ACID, PLASMA: Lactic Acid, Venous: 0.8 mmol/L (ref 0.5–2.0)

## 2015-07-11 MED ORDER — FAMOTIDINE 20 MG PO TABS: 20.0000 mg | ORAL_TABLET | Freq: Two times a day (BID) | ORAL | Status: DC

## 2015-07-11 MED ORDER — TRIAMTERENE-HCTZ 37.5-25 MG PO TABS: 1.0000 | ORAL_TABLET | Freq: Every day | ORAL | Status: DC

## 2015-07-11 MED ORDER — CIPROFLOXACIN HCL 500 MG PO TABS
500.0000 mg | ORAL_TABLET | Freq: Two times a day (BID) | ORAL | Status: DC
Start: 2015-07-11 — End: 2015-08-15

## 2015-07-11 NOTE — Progress Notes (Signed)
  Echocardiogram 2D Echocardiogram has been performed.  Kristin Webb 07/11/2015, 12:17 PM

## 2015-07-11 NOTE — Discharge Summary (Addendum)
Discharge Summary  Kristin Webb [id number] DOB: 1946-01-04  PCP: Cari Caraway, MD  Admit date: 07/09/2015 Discharge date: 07/11/2015  Time spent: <34mins  Recommendations for Outpatient Follow-up:  1. F/u with PMD within two weeks for hospital discharge follow up, repeat cbc/bmp at follow up, f/u final echocardiogram. 2. F/u with urology on 12/9   Discharge Diagnoses:  Active Hospital Problems   Diagnosis Date Noted  . Pyelonephritis 07/09/2015  . Nephrolithiasis 07/09/2015  . Ovarian ca [facility]) 11/17/2012    Resolved Hospital Problems   Diagnosis Date Noted Date Resolved  No resolved problems to display.    Discharge Condition: stable  Diet recommendation: heart healthy  Filed Weights   07/09/15 2051  Weight: 176 lb 2.4 oz (79.9 kg)    History of present illness:   69 year old female who presented to the emergency department after approximate 24 hours of left flank pain and left lower quadrant pain with associated nausea, vomiting, and diarrhea. The patient states that she had subjective fevers at home. Her pain was fairly acute in onset and is poorly controlled. In addition, the patient has dysuria and worsening frequency and urgency. The patient has no history of kidney stones in the past.  In the emergency department the patient was noted to have an elevated white blood cell count to 12.0. Her renal function was normal appearing. Her lactate was 3. Her urinalysis demonstrated nitrite positive pyuria.She subsequently underwent CT scan which demonstrated a large 12 mm stone located in the left proximal ureter with associated proximal hydronephrosis. There was thickening of the ureteral lining as well as a delayed nephrogram and findings consistent with pyelonephritis. 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 The patient was slightly tachycardic but afebrile with stable vital signs.  Hospital Course:  Active Problems:   Ovarian ca  [facility])   Pyelonephritis   Nephrolithiasis  1-Pyelonephritis, left ureteropelvic stone.  Started on IV ceftriaxone, IV fluids from admission  s/p sten placement on 11/29 Follow blood culture, urine culture.  abx changed to oral cipro on 11/30 per urology recommendation. Discharged with cipro and outpatient urology follow up.  2-Early sepsis:  Presents with mild hypotension SBP 97/81, leukocytosis, elevated lactic acid at 3.  IV fluids, IV ceftriaxone. home meds maxzide held since admission Improving, culture no growth, abx changed to oral cipro ,  Ivf d/ced once adequate oral intake. Resolved.  3. H/o HTN on maxzide only at home, maxzide held in the hospital due to sepsis, restart maxzide in two days after discharge, advised patient to do home bp monitoring.  4. History of ovarian cancer. Last ca 125 done in 05/2015 was 7. Closed followed on outpatient basis.  5. Chronic abdominal wall ventral hernia, stable, no pain.  6. Cardiac murmur at left upper sternal border: echo done ,result pending, patient  Denies chest pain, no sob, no syncope, fever has subsided, she is eager to go home and follow up with echo result outpatient.   Code Status: full  Family Communication: patient   Disposition Plan:  home on 12/1.   Consultants:  urology  Procedures: On 11/29: 1. Cystoscopy 2. left ureteral stent placement 3. left retrograde pyelography with interpretation  Antibiotics:  Rocephin from admission to 11/30  Oral cipro from 11/30 per urology recommendation  Discharge Exam: BP 120/75 mmHg  Pulse 73  Temp(Src) 97.6 F (36.4 C) (Oral)  Resp 18  Ht 5' (1.524 m)  Wt 176 lb 2.4 oz (79.9 kg)  BMI 34.40 kg/m2  SpO2 96%   General: NAD  Cardiovascular: RRR, soft murmur at left upper sternal border.  Respiratory: CTABL  Abdomen: Soft/ND/NT, positive BS, chronic abdominal wall ventral hernia, reducible, nontender. Well healed midline surgical scar.  Musculoskeletal:  No Edema  Neuro: aaox3  Skin: scatter healing skin rash (reported from recent poison ivy), mild erythema sacral gluteal skin fold, no obvious skin breakdown.   Discharge Instructions You were cared for by a hospitalist during your hospital stay. If you have any questions about your discharge medications or the care you received while you were in the hospital after you are discharged, you can call the unit and asked to speak with the hospitalist on call if the hospitalist that took care of you is not available. Once you are discharged, your primary care physician will handle any further medical issues. Please note that NO REFILLS for any discharge medications will be authorized once you are discharged, as it is imperative that you return to your primary care physician (or establish a relationship with a primary care physician if you do not have one) for your aftercare needs so that they can reassess your need for medications and monitor your lab values.     Medication List    STOP taking these medications        Armodafinil 250 MG tablet  Commonly known as:  NUVIGIL      TAKE these medications        CALCIUM CITRATE + D PO  Take 1 capsule by mouth daily.     ciprofloxacin 500 MG tablet  Commonly known as:  CIPRO  Take 1 tablet (500 mg total) by mouth 2 (two) times daily.     diphenhydrAMINE 25 mg capsule  Commonly known as:  BENADRYL  Take 50 mg by mouth at bedtime.     EPIPEN 2-PAK 0.3 mg/0.3 mL Devi  Generic drug:  EPINEPHrine  Inject 0.3 mg into the muscle once. For bee stings     eszopiclone 2 MG Tabs tablet  Commonly known as:  LUNESTA  Take 1 tablet (2 mg total) by mouth at bedtime as needed for sleep. Take immediately before bedtime     famotidine 20 MG tablet  Commonly known as:  PEPCID  Take 1 tablet (20 mg total) by mouth 2 (two) times daily.     FLUoxetine 20 MG capsule  Commonly known as:  PROZAC  Take 20 mg by mouth daily. Take with 10mg  tablet for total  dose of 30mg  daily     FLUoxetine 10 MG capsule  Commonly known as:  PROZAC  Take 10 mg by mouth daily.     ibuprofen 200 MG tablet  Commonly known as:  ADVIL,MOTRIN  Take 200 mg by mouth every 6 (six) hours as needed for pain.     ketoconazole 2 % cream  Commonly known as:  NIZORAL  Apply 1 application topically 2 (two) times daily as needed for irritation (yeast infection).     multivitamin with minerals Tabs tablet  Take 1 tablet by mouth daily.     nystatin 100000 UNIT/GM Powd  Apply 1 g topically 2 (two) times daily as needed (yeast infection).     rosuvastatin 10 MG tablet  Commonly known as:  CRESTOR  Take 10 mg by mouth daily.     triamcinolone cream 0.5 %  Commonly known as:  KENALOG  Apply 1 application topically 2 (two) times daily as needed (poison ivy).     triamterene-hydrochlorothiazide 37.5-25 MG tablet  Commonly known as:  MAXZIDE-25  Take 1 tablet by mouth daily.  Start taking on:  07/13/2015       Allergies  Allergen Reactions  . Bee Venom Hives    Difficulty breathing, carries an EPI-pen  . Cortisone     Injected cortisone, "sick to my stomach, throwing up, hand swelling, and pain."       Follow-up Information    Follow up with Ardis Hughs, MD On 07/19/2015.   Specialty:  Urology   Why:  1:15pm   Contact information:   [address] [place] [address] [phone]       Follow up with MCNEILL,WENDY, MD In 2 weeks.   Specialty:  Family Medicine   Why:  hospital discharge follow up, repeat basic lab works including cbc/bmp at follow up   Contact information:   [place] [id number] [phone]        The results of significant diagnostics from this hospitalization (including imaging, microbiology, ancillary and laboratory) are listed below for reference.    Significant Diagnostic Studies: Ct Abdomen Pelvis W Contrast  07/09/2015  CLINICAL DATA:  69 year old female with recent history of lower  abdominal pain common nausea and vomiting since yesterday. History of ovarian cancer status post hysterectomy and bilateral salpingo oophorectomy. EXAM: CT ABDOMEN AND PELVIS WITH CONTRAST TECHNIQUE: Multidetector CT imaging of the abdomen and pelvis was performed using the standard protocol following bolus administration of intravenous contrast. CONTRAST:  18mL OMNIPAQUE IOHEXOL 300 MG/ML  SOLN COMPARISON:  CT the abdomen and pelvis 04/24/2013. FINDINGS: Lower chest:  Unremarkable. Hepatobiliary: No cystic or solid hepatic lesions. No intra or extrahepatic biliary ductal dilatation. Status post cholecystectomy. Pancreas: No pancreatic mass. No pancreatic ductal dilatation. No pancreatic or peripancreatic fluid or inflammatory changes. Spleen: Unremarkable. Adrenals/Urinary Tract: 12 mm obstructive calculus at the left ureteropelvic junction with moderate proximal with hydronephrosis and perinephric stranding. No additional calculi are noted in the right renal collecting system, along the course of either ureter or within the lumen of the urinary bladder. Sub cm low-attenuation lesion in the upper pole of the left kidney is too small to definitively characterize, but statistically favored to represent a tiny cyst. 9 mm fatty attenuation lesion in the interpolar region of the right kidney is compatible with a tiny angiomyolipoma. Tiny locule of gas in the nondependent portion of the urinary bladder, presumably iatrogenic. Urinary bladder is otherwise normal in appearance. Bilateral adrenal glands are normal in appearance. Stomach/Bowel: Normal appearance of the stomach. No pathologic dilatation of small bowel or colon. A short portion of the mid transverse colon extends into a small supraumbilical ventral hernia. There is also a short segment of the mid small bowel which extends into a smaller ventral hernia immediately inferior to this, but also supraumbilical in location. The appendix is not confidently  identified, and likely surgically absent. Regardless, no inflammatory changes are noted adjacent to the cecum to suggest presence of an acute appendicitis at this time. Vascular/Lymphatic: No significant atherosclerotic disease, aneurysm or dissection identified in the abdominal or pelvic vasculature. No lymphadenopathy noted in the abdomen or pelvis. Multiple reactive sized retroperitoneal lymph nodes are incidentally noted. Reproductive: Status post total abdominal hysterectomy and bilateral salpingo-oophorectomy. No unexpected pelvic mass identified. Other: No significant volume of ascites.  No pneumoperitoneum. Musculoskeletal: There are no aggressive appearing lytic or blastic lesions noted in the visualized portions of the skeleton. IMPRESSION: 1. 12 mm obstructive calculus at the left ureteropelvic junction with moderate  proximal hydronephrosis. 2. 2 small supraumbilical ventral hernias, one containing a short segment of the mid transverse colon and the other containing a short segment of the mid small bowel. There is no associated evidence to suggest bowel incarceration or obstruction at this time. 3. Tiny locule of gas non dependently in the lumen of the urinary bladder. This is presumably iatrogenic related to recent catheterization for urinalysis. Alternatively, this could be seen in the setting of urinary tract infection with gas-forming organisms. Clinical correlation for history of recent catheterization is recommended. 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 5. Status post cholecystectomy. 6. Additional incidental findings, as above. Electronically Signed   By: Vinnie Langton M.D.   On: 07/09/2015 13:04   Dg Retrograde Pyelogram  07/10/2015  CLINICAL DATA:  Kidney stone. EXAM: RETROGRADE PYELOGRAM COMPARISON:  CT 07/09/2015 FINDINGS: Contrast is noted in the left renal collecting system. A stone is noted in the left ureterovesical junction as noted on prior CT. Left  ureteral stent has been placed, its tip scar in the upper left renal pelvis and bladder. Radiation dose 13.5 mGy. IMPRESSION: 1. Left ureteral pelvic junction stone again noted. 2.  Left ureteral stent in good anatomic position . Electronically Signed   By: Marcello Moores  Register   On: 07/10/2015 07:16    Microbiology: Recent Results (from the past 240 hour(s))  Urine culture     Status: None   Collection Time: 07/09/15  1:26 PM  Result Value Ref Range Status   Specimen Description URINE, CLEAN CATCH  Final   Special Requests NONE  Final   Culture MULTIPLE SPECIES PRESENT, SUGGEST RECOLLECTION  Final   Report Status 07/10/2015 FINAL  Final  Blood culture (routine x 2)     Status: None (Preliminary result)   Collection Time: 07/09/15  3:25 PM  Result Value Ref Range Status   Specimen Description BLOOD LEFT FOREARM  Final   Special Requests   Final    BOTTLES DRAWN AEROBIC AND ANAEROBIC 4CC PUR AND 5CC BLUE   Culture NO GROWTH < 24 HOURS  Final   Report Status PENDING  Incomplete  Blood culture (routine x 2)     Status: None (Preliminary result)   Collection Time: 07/09/15  3:28 PM  Result Value Ref Range Status   Specimen Description BLOOD LEFT HAND  Final   Special Requests IN PEDIATRIC BOTTLE 3CC  Final   Culture NO GROWTH < 24 HOURS  Final   Report Status PENDING  Incomplete     Labs: Basic Metabolic Panel:  Recent Labs Lab 07/09/15 0941 07/10/15 0510 07/11/15 0509  NA 139 136 142  K 3.5 3.0* 4.0  CL 102 101 108  CO2 25 28 29   GLUCOSE 161* 114* 98  BUN 13 12 11   CREATININE 0.91 0.76 0.67  CALCIUM 9.4 8.0* 8.4*  MG  --   --  1.7   Liver Function Tests:  Recent Labs Lab 07/09/15 0941  AST 29  ALT 24  ALKPHOS 90  BILITOT 0.9  PROT 6.9  ALBUMIN 4.0    Recent Labs Lab 07/09/15 0941  LIPASE 23   No results for input(s): AMMONIA in the last 168 hours. CBC:  Recent Labs Lab 07/09/15 0941 07/10/15 0510 07/11/15 0509  WBC 12.0* 9.6 6.4  NEUTROABS 11.3*  --    --   HGB 15.0 11.5* 11.4*  HCT 45.7 35.7* 35.5*  MCV 89.3 91.1 91.5  PLT 128* 106* 99*   Cardiac Enzymes: No  results for input(s): CKTOTAL, CKMB, CKMBINDEX, TROPONINI in the last 168 hours. BNP: BNP (last 3 results) No results for input(s): BNP in the last 8760 hours.  ProBNP (last 3 results) No results for input(s): PROBNP in the last 8760 hours.  CBG:  Recent Labs Lab 07/11/15 0729  GLUCAP 92       Signed:  Derius Ghosh MD, PhD  [facility] 07/11/2015, 9:45 AM

## 2015-07-12 ENCOUNTER — Telehealth: Payer: Self-pay | Admitting: Neurology

## 2015-07-12 DIAGNOSIS — K146 Glossodynia: Secondary | ICD-10-CM

## 2015-07-12 DIAGNOSIS — G4700 Insomnia, unspecified: Secondary | ICD-10-CM

## 2015-07-12 DIAGNOSIS — F418 Other specified anxiety disorders: Secondary | ICD-10-CM

## 2015-07-12 DIAGNOSIS — F5105 Insomnia due to other mental disorder: Secondary | ICD-10-CM

## 2015-07-12 NOTE — Telephone Encounter (Signed)
Pt called requesting refill for eszopiclone (LUNESTA) 2 MG TABS tablet . Please send to [facility].

## 2015-07-14 LAB — CULTURE, BLOOD (ROUTINE X 2)
Culture: NO GROWTH
Culture: NO GROWTH

## 2015-07-15 MED ORDER — ESZOPICLONE 2 MG PO TABS: 2.0000 mg | ORAL_TABLET | Freq: Every evening | ORAL | Status: DC | PRN

## 2015-07-15 NOTE — Telephone Encounter (Signed)
Rx signed and faxed.

## 2015-07-15 NOTE — Telephone Encounter (Signed)
Pt called inquiring if refill has been approved. Please call and advise at [phone]. Pt is totally of out of medication

## 2015-07-15 NOTE — Telephone Encounter (Signed)
I spoke with patient who is aware Rx has been faxed

## 2015-07-19 DIAGNOSIS — N201 Calculus of ureter: Secondary | ICD-10-CM | POA: Diagnosis not present

## 2015-07-22 DIAGNOSIS — N12 Tubulo-interstitial nephritis, not specified as acute or chronic: Secondary | ICD-10-CM | POA: Diagnosis not present

## 2015-07-22 DIAGNOSIS — R010 Benign and innocent cardiac murmurs: Secondary | ICD-10-CM | POA: Diagnosis not present

## 2015-07-22 DIAGNOSIS — N200 Calculus of kidney: Secondary | ICD-10-CM | POA: Diagnosis not present

## 2015-07-23 ENCOUNTER — Other Ambulatory Visit: Payer: Self-pay | Admitting: Urology

## 2015-07-24 DIAGNOSIS — M797 Fibromyalgia: Secondary | ICD-10-CM | POA: Diagnosis not present

## 2015-07-24 DIAGNOSIS — C5700 Malignant neoplasm of unspecified fallopian tube: Secondary | ICD-10-CM | POA: Diagnosis not present

## 2015-07-24 DIAGNOSIS — G47419 Narcolepsy without cataplexy: Secondary | ICD-10-CM | POA: Diagnosis not present

## 2015-07-24 DIAGNOSIS — K529 Noninfective gastroenteritis and colitis, unspecified: Secondary | ICD-10-CM | POA: Diagnosis not present

## 2015-07-25 DIAGNOSIS — K529 Noninfective gastroenteritis and colitis, unspecified: Secondary | ICD-10-CM | POA: Diagnosis not present

## 2015-07-26 ENCOUNTER — Ambulatory Visit (HOSPITAL_BASED_OUTPATIENT_CLINIC_OR_DEPARTMENT_OTHER): Payer: Commercial Managed Care - HMO

## 2015-07-26 ENCOUNTER — Other Ambulatory Visit: Payer: Commercial Managed Care - HMO

## 2015-07-26 DIAGNOSIS — C5700 Malignant neoplasm of unspecified fallopian tube: Secondary | ICD-10-CM

## 2015-07-26 DIAGNOSIS — Z452 Encounter for adjustment and management of vascular access device: Secondary | ICD-10-CM

## 2015-07-26 DIAGNOSIS — Z95828 Presence of other vascular implants and grafts: Secondary | ICD-10-CM

## 2015-07-26 DIAGNOSIS — C5701 Malignant neoplasm of right fallopian tube: Secondary | ICD-10-CM

## 2015-07-26 MED ORDER — SODIUM CHLORIDE 0.9 % IJ SOLN
10.0000 mL | INTRAMUSCULAR | Status: DC | PRN
  Administered 2015-07-26: 10 mL via INTRAVENOUS
  Filled 2015-07-26: qty 10

## 2015-07-26 MED ORDER — HEPARIN SOD (PORK) LOCK FLUSH 100 UNIT/ML IV SOLN
500.0000 [IU] | Freq: Once | INTRAVENOUS | Status: AC
  Administered 2015-07-26: 500 [IU] via INTRAVENOUS
  Filled 2015-07-26: qty 5

## 2015-07-27 LAB — CA 125: CA 125: 6 U/mL (ref <35)

## 2015-07-28 ENCOUNTER — Other Ambulatory Visit: Payer: Self-pay | Admitting: Oncology

## 2015-07-28 DIAGNOSIS — C569 Malignant neoplasm of unspecified ovary: Secondary | ICD-10-CM

## 2015-07-28 DIAGNOSIS — C561 Malignant neoplasm of right ovary: Secondary | ICD-10-CM

## 2015-07-28 DIAGNOSIS — C5701 Malignant neoplasm of right fallopian tube: Secondary | ICD-10-CM

## 2015-08-06 ENCOUNTER — Encounter (HOSPITAL_COMMUNITY): Payer: Self-pay | Admitting: *Deleted

## 2015-08-13 NOTE — H&P (Signed)
Reason For Visit hospital f/u of infected left 31mm ureteral calculus   History of Present Illness 70F presents today for follow-up. She presented to the ED on 11/29 with fevers, left flank pain and nausea/vomitting. She was found to have an infected left 12 mm proximal stone. She was taken to the OR urgently for a left ureteral stent placement. She was in the hospital for 3 days and never spiked a fever. She was discharged on Cipro.  Interval: The patient has done quite well since her discharge. She denies any fevers or chills. She denies any dysuria. She does have urinary urgency. She has not had any gross hematuria. She has not had any ongoing flank pain.   Past Medical History Problems  1. History of arthritis (Z87.39) 2. History of fibromyalgia (Z87.39) 3. History of ovarian cancer (Z85.43) 4. History of sleep apnea (Z87.09)  Surgical History Problems  1. History of Gallbladder Surgery 2. History of Hysterectomy 3. History of Ovarian Surgery  Current Meds 1. Aspirin TABS;  Therapy: (Recorded:09Dec2016) to Recorded 2. Benadryl TABS;  Therapy: (Recorded:09Dec2016) to Recorded 3. Calcium TABS;  Therapy: (Recorded:09Dec2016) to Recorded 4. Crestor TABS;  Therapy: (Recorded:09Dec2016) to Recorded 5. Daily Multivitamin TABS;  Therapy: (Recorded:09Dec2016) to Recorded 6. FLUoxetine HCl - 20 MG Oral Tablet;  Therapy: (Recorded:09Dec2016) to Recorded 7. Lunesta 2 MG Oral Tablet;  Therapy: (Recorded:09Dec2016) to Recorded 8. Tylenol CAPS;  Therapy: (Recorded:09Dec2016) to Recorded 9. Vitamin D 2000 UNIT Oral Tablet;  Therapy: (Recorded:09Dec2016) to Recorded  Allergies Medication  1. cortisone  Family History Problems  1. Family history of Death of family member : Mother, Father   Mother at age 67; cancerFather at age 74; cancer 2. Family history of diabetes mellitus (Z83.3) : Maternal Grandfather 3. Family history of lung cancer (Z80.1) : Mother 4. Family history of  nephrolithiasis (Z84.1) : Mother, Sister  Social History Problems  1. Denied: History of Alcohol use 2. Caffeine use (F15.90)   tea 3. Never a smoker 4. Retired 30. Widowed (Z63.4)  Vitals Vital Signs [Data Includes: Last 1 Day]  Recorded: [id number] 02:02PM  Weight: 169 lb  BMI Calculated: 33.01 BSA Calculated: 1.74 Recorded: [id number] 01:32PM  Height: 5 ft  Weight: 170 lb  BMI Calculated: 33.2 BSA Calculated: 1.74 Blood Pressure: 144 / 86 Temperature: 97.8 F Heart Rate: 88  Physical Exam Constitutional: Well nourished and well developed . No acute distress.  ENT:. The ears and nose are normal in appearance.  Neck: The appearance of the neck is normal and no neck mass is present.  Pulmonary: No respiratory distress and normal respiratory rhythm and effort.  Cardiovascular: Heart rate and rhythm are normal . No peripheral edema.  Abdomen: The abdomen is soft and nontender.  Lymphatics: The femoral and inguinal nodes are not enlarged or tender.  Skin: Normal skin turgor, no visible rash and no visible skin lesions.  Neuro/Psych:. Mood and affect are appropriate.    Results/Data Urine [Data Includes: Last 1 Day]   [id number]  COLOR YELLOW   APPEARANCE CLEAR   SPECIFIC GRAVITY 1.025   pH 5.5   GLUCOSE NEGATIVE   BILIRUBIN NEGATIVE   KETONE NEGATIVE   BLOOD 3+   PROTEIN TRACE   NITRITE NEGATIVE   LEUKOCYTE ESTERASE NEGATIVE   SQUAMOUS EPITHELIAL/HPF 0-5 HPF  WBC 0-5 WBC/HPF  RBC 40-60 RBC/HPF  BACTERIA NONE SEEN HPF  CRYSTALS NONE SEEN HPF  CASTS NONE SEEN LPF  Yeast NONE SEEN HPF   Urinalysis demonstrates microscopic hematuria without evidence of  infection  KUB: The renal shadows are easily visualized bilaterally. The patient has an indwelling left double-J ureteral stent which appears to be well positioned. There is a calcification in the left kidney which appears to be consistent with the patient's known stone, which has been pushed up into the renal pelvis.  There are no additional stones overlying the left renal pelvis or along the expected trajectory of the left ureter. The right side demonstrates no obvious stones as well. The gas pattern is grossly normal. The bone structures are without significant.   Assessment Assessed  1. Left ureteral calculus (N20.1)  Plan Health Maintenance  1. UA With REFLEX; [Do Not Release]; Status:Complete;   [id number] 01:05PM Left ureteral calculus  2. Follow-up Schedule Surgery Office  Follow-up  Status: Complete  Done: [id number]  Discussion/Summary The patient had a 12 mm proximal stone with evidence of a urinary tract infection approximately 10 days ago. She was taken urgently to the operating room and a stent placed. She did great postoperatively, never developed sepsis or hemodynamic instability. She was discharged home on ciprofloxacin. Since being discharged home she is to well. She does not have any ongoing dysuria, fevers, flank pain, or voiding symptoms. I discussed treatment options for her as relates to her stone. In particular, we spent most of our time discussing ureteroscopy versus shockwave lithotripsy. I discussed the risks and benefits of both of these procedures. Ultimately, the patient has opted for shockwave lithotripsy. In making this decision, she understands that there is a possibility of needing additional procedures, developing a perinephric hematoma, and there is no guarantee that she will pass the fragments after being broken up. We'll get her scheduled at her convenience. I did send a urine culture today to ensure that she has cleared her infection although it appears she has clinically.   Signatures Electronically signed by : Louis Meckel, M.D.; Jul 19 2015  4:39PM EST   Add: f/u urine Cx and U/a in office negative. Stent and left stone (dropped into renal pelvis) visible on KUB in office.

## 2015-08-15 ENCOUNTER — Ambulatory Visit (HOSPITAL_COMMUNITY)
Admission: RE | Admit: 2015-08-15 | Discharge: 2015-08-15 | Disposition: A | Discharge status: Home / Self Care | Payer: Commercial Managed Care - HMO | Source: Ambulatory Visit | Attending: Urology | Admitting: Urology

## 2015-08-15 ENCOUNTER — Ambulatory Visit (HOSPITAL_COMMUNITY): Payer: Commercial Managed Care - HMO

## 2015-08-15 ENCOUNTER — Encounter (HOSPITAL_COMMUNITY): Payer: Self-pay | Admitting: *Deleted

## 2015-08-15 ENCOUNTER — Encounter (HOSPITAL_COMMUNITY)
Admission: RE | Disposition: A | Discharge status: Home / Self Care | Payer: Self-pay | Source: Ambulatory Visit | Attending: Urology

## 2015-08-15 DIAGNOSIS — I10 Essential (primary) hypertension: Secondary | ICD-10-CM | POA: Insufficient documentation

## 2015-08-15 DIAGNOSIS — M797 Fibromyalgia: Secondary | ICD-10-CM | POA: Insufficient documentation

## 2015-08-15 DIAGNOSIS — Z7982 Long term (current) use of aspirin: Secondary | ICD-10-CM | POA: Diagnosis not present

## 2015-08-15 DIAGNOSIS — Z8543 Personal history of malignant neoplasm of ovary: Secondary | ICD-10-CM | POA: Diagnosis not present

## 2015-08-15 DIAGNOSIS — G4730 Sleep apnea, unspecified: Secondary | ICD-10-CM | POA: Diagnosis not present

## 2015-08-15 DIAGNOSIS — Z791 Long term (current) use of non-steroidal anti-inflammatories (NSAID): Secondary | ICD-10-CM | POA: Insufficient documentation

## 2015-08-15 DIAGNOSIS — Z79899 Other long term (current) drug therapy: Secondary | ICD-10-CM | POA: Insufficient documentation

## 2015-08-15 DIAGNOSIS — Z90710 Acquired absence of both cervix and uterus: Secondary | ICD-10-CM | POA: Insufficient documentation

## 2015-08-15 DIAGNOSIS — N200 Calculus of kidney: Secondary | ICD-10-CM | POA: Diagnosis not present

## 2015-08-15 DIAGNOSIS — M1990 Unspecified osteoarthritis, unspecified site: Secondary | ICD-10-CM | POA: Insufficient documentation

## 2015-08-15 SURGERY — LITHOTRIPSY, ESWL
Anesthesia: LOCAL | Laterality: Left

## 2015-08-15 MED ORDER — SODIUM CHLORIDE 0.9 % IV SOLN
INTRAVENOUS | Status: DC
  Administered 2015-08-15: 10:00:00 via INTRAVENOUS

## 2015-08-15 MED ORDER — DIAZEPAM 5 MG PO TABS
10.0000 mg | ORAL_TABLET | ORAL | Status: AC
  Administered 2015-08-15: 10 mg via ORAL
  Filled 2015-08-15: qty 2

## 2015-08-15 MED ORDER — OXYCODONE-ACETAMINOPHEN 5-325 MG PO TABS: 1.0000 | ORAL_TABLET | ORAL | Status: DC | PRN

## 2015-08-15 MED ORDER — DIPHENHYDRAMINE HCL 25 MG PO CAPS
25.0000 mg | ORAL_CAPSULE | ORAL | Status: AC
  Administered 2015-08-15: 25 mg via ORAL
  Filled 2015-08-15: qty 1

## 2015-08-15 MED ORDER — SULFAMETHOXAZOLE-TRIMETHOPRIM 800-160 MG PO TABS: 1.0000 | ORAL_TABLET | Freq: Two times a day (BID) | ORAL | Status: DC

## 2015-08-15 MED ORDER — CIPROFLOXACIN HCL 500 MG PO TABS
500.0000 mg | ORAL_TABLET | ORAL | Status: AC
Start: 2015-08-15 — End: 2015-08-15
  Administered 2015-08-15: 500 mg via ORAL
  Filled 2015-08-15: qty 1

## 2015-08-15 NOTE — Discharge Instructions (Signed)
Lithotripsy, Care After °Refer to this sheet in the next few weeks. These instructions provide you with information on caring for yourself after your procedure. Your health care provider may also give you more specific instructions. Your treatment has been planned according to current medical practices, but problems sometimes occur. Call your health care provider if you have any problems or questions after your procedure. °WHAT TO EXPECT AFTER THE PROCEDURE  °· Your urine may have a red tinge for a few days after treatment. Blood loss is usually minimal. °· You may have soreness in the back or flank area. This usually goes away after a few days. The procedure can cause blotches or bruises on the back where the pressure wave enters the skin. These marks usually cause only minimal discomfort and should disappear in a short time. °· Stone fragments should begin to pass within 24 hours of treatment. However, a delayed passage is not unusual. °· You may have pain, discomfort, and feel sick to your stomach (nauseated) when the crushed fragments of stone are passed down the tube from the kidney to the bladder. Stone fragments can pass soon after the procedure and may last for up to 4-8 weeks. °· A small number of patients may have severe pain when stone fragments are not able to pass, which leads to an obstruction. °· If your stone is greater than 1 inch (2.5 cm) in diameter or if you have multiple stones that have a combined diameter greater than 1 inch (2.5 cm), you may require more than one treatment. °· If you had a stent placed prior to your procedure, you may experience some discomfort, especially during urination. You may experience the pain or discomfort in your flank or back, or you may experience a sharp pain or discomfort at the base of your penis or in your lower abdomen. The discomfort usually lasts only a few minutes after urinating. °HOME CARE INSTRUCTIONS  °· Rest at home until you feel your energy  improving. °· Only take over-the-counter or prescription medicines for pain, discomfort, or fever as directed by your health care provider. Depending on the type of lithotripsy, you may need to take antibiotics and anti-inflammatory medicines for a few days. °· Drink enough water and fluids to keep your urine clear or pale yellow. This helps "flush" your kidneys. It helps pass any remaining pieces of stone and prevents stones from coming back. °· Most people can resume daily activities within 1-2 days after standard lithotripsy. It can take longer to recover from laser and percutaneous lithotripsy. °· Strain all urine through the provided strainer. Keep all particulate matter and stones for your health care provider to see. The stone may be as small as a grain of salt. It is very important to use the strainer each and every time you pass your urine. Any stones that are found can be sent to a medical lab for examination. °· Visit your health care provider for a follow-up appointment in a few weeks. Your doctor may remove your stent if you have one. Your health care provider will also check to see whether stone particles still remain. °SEEK MEDICAL CARE IF:  °· Your pain is not relieved by medicine. °· You have a lasting nauseous feeling. °· You feel there is too much blood in the urine. °· You develop persistent problems with frequent or painful urination that does not at least partially improve after 2 days following the procedure. °· You have a congested cough. °· You feel   lightheaded. °· You develop a rash or any other signs that might suggest an allergic problem. °· You develop any reaction or side effects to your medicine(s). °SEEK IMMEDIATE MEDICAL CARE IF:  °· You experience severe back or flank pain or both. °· You see nothing but blood when you urinate. °· You cannot pass any urine at all. °· You have a fever or shaking chills. °· You develop shortness of breath, difficulty breathing, or chest pain. °· You  develop vomiting that will not stop after 6-8 hours. °· You have a fainting episode. °  °This information is not intended to replace advice given to you by your health care provider. Make sure you discuss any questions you have with your health care provider. °  °Document Released: 08/16/2007 Document Revised: 04/17/2015 Document Reviewed: 02/09/2013 °Elsevier Interactive Patient Education ©2016 [employer] ° °

## 2015-08-15 NOTE — Op Note (Signed)
Left ESWL  Left UPJ/pelvic stone - 12 mm  Fragmented well.

## 2015-08-15 NOTE — Interval H&P Note (Signed)
History and Physical Interval Note:  08/15/2015 10:52 AM  Kristin Webb  has presented today for surgery, with the diagnosis of LEFT RENAL STONE  The various methods of treatment have been discussed with the patient and family. After consideration of risks, benefits and other options for treatment, the patient has consented to  Procedure(s): LEFT EXTRACORPOREAL SHOCK WAVE LITHOTRIPSY (ESWL) (Left) as a surgical intervention .  The patient's history has been reviewed, patient examined, no change in status, stable for surgery. She expressed stories of a family member who had infection after ESWL and another who required multiple procedures. We discussed these risks among the others. She has had no dysuria or gross hematuria. Stone in left renal pelvis/UPJ on KUB.  I have reviewed the patient's chart and labs.  Questions were answered to the patient's satisfaction.  She elects to proceed.    Raechel Marcos

## 2015-08-29 DIAGNOSIS — N200 Calculus of kidney: Secondary | ICD-10-CM | POA: Diagnosis not present

## 2015-08-29 DIAGNOSIS — R197 Diarrhea, unspecified: Secondary | ICD-10-CM | POA: Diagnosis not present

## 2015-08-30 DIAGNOSIS — Z0000 Encounter for general adult medical examination without abnormal findings: Secondary | ICD-10-CM | POA: Diagnosis not present

## 2015-08-30 DIAGNOSIS — N201 Calculus of ureter: Secondary | ICD-10-CM | POA: Diagnosis not present

## 2015-09-23 ENCOUNTER — Ambulatory Visit
Admission: RE | Admit: 2015-09-23 | Discharge: 2015-09-23 | Disposition: A | Discharge status: Home / Self Care | Payer: Commercial Managed Care - HMO | Source: Ambulatory Visit | Attending: Oncology | Admitting: Oncology

## 2015-09-23 DIAGNOSIS — R922 Inconclusive mammogram: Secondary | ICD-10-CM

## 2015-09-23 DIAGNOSIS — Z1239 Encounter for other screening for malignant neoplasm of breast: Secondary | ICD-10-CM

## 2015-09-23 DIAGNOSIS — Z1231 Encounter for screening mammogram for malignant neoplasm of breast: Secondary | ICD-10-CM | POA: Diagnosis not present

## 2015-09-25 ENCOUNTER — Encounter: Payer: Self-pay | Admitting: Gynecologic Oncology

## 2015-09-25 ENCOUNTER — Ambulatory Visit: Payer: Commercial Managed Care - HMO

## 2015-09-25 ENCOUNTER — Other Ambulatory Visit: Payer: Self-pay | Admitting: Oncology

## 2015-09-25 ENCOUNTER — Other Ambulatory Visit (HOSPITAL_BASED_OUTPATIENT_CLINIC_OR_DEPARTMENT_OTHER): Payer: Commercial Managed Care - HMO

## 2015-09-25 ENCOUNTER — Ambulatory Visit: Payer: Commercial Managed Care - HMO | Attending: Gynecologic Oncology | Admitting: Gynecologic Oncology

## 2015-09-25 VITALS — BP 128/84 | HR 77 | Temp 97.9°F | Resp 19 | Wt 172.4 lb

## 2015-09-25 DIAGNOSIS — Z8543 Personal history of malignant neoplasm of ovary: Secondary | ICD-10-CM | POA: Insufficient documentation

## 2015-09-25 DIAGNOSIS — K439 Ventral hernia without obstruction or gangrene: Secondary | ICD-10-CM | POA: Diagnosis not present

## 2015-09-25 DIAGNOSIS — C5700 Malignant neoplasm of unspecified fallopian tube: Secondary | ICD-10-CM | POA: Insufficient documentation

## 2015-09-25 DIAGNOSIS — Z8544 Personal history of malignant neoplasm of other female genital organs: Secondary | ICD-10-CM | POA: Diagnosis not present

## 2015-09-25 DIAGNOSIS — R1900 Intra-abdominal and pelvic swelling, mass and lump, unspecified site: Secondary | ICD-10-CM | POA: Diagnosis not present

## 2015-09-25 DIAGNOSIS — Z9049 Acquired absence of other specified parts of digestive tract: Secondary | ICD-10-CM | POA: Diagnosis not present

## 2015-09-25 DIAGNOSIS — G4730 Sleep apnea, unspecified: Secondary | ICD-10-CM | POA: Insufficient documentation

## 2015-09-25 DIAGNOSIS — K5900 Constipation, unspecified: Secondary | ICD-10-CM | POA: Diagnosis not present

## 2015-09-25 DIAGNOSIS — K219 Gastro-esophageal reflux disease without esophagitis: Secondary | ICD-10-CM | POA: Diagnosis not present

## 2015-09-25 DIAGNOSIS — Z8489 Family history of other specified conditions: Secondary | ICD-10-CM | POA: Insufficient documentation

## 2015-09-25 DIAGNOSIS — N1330 Unspecified hydronephrosis: Secondary | ICD-10-CM | POA: Diagnosis not present

## 2015-09-25 DIAGNOSIS — Z90710 Acquired absence of both cervix and uterus: Secondary | ICD-10-CM | POA: Insufficient documentation

## 2015-09-25 DIAGNOSIS — I10 Essential (primary) hypertension: Secondary | ICD-10-CM | POA: Insufficient documentation

## 2015-09-25 DIAGNOSIS — D1771 Benign lipomatous neoplasm of kidney: Secondary | ICD-10-CM | POA: Diagnosis not present

## 2015-09-25 DIAGNOSIS — C5701 Malignant neoplasm of right fallopian tube: Secondary | ICD-10-CM | POA: Diagnosis not present

## 2015-09-25 DIAGNOSIS — N201 Calculus of ureter: Secondary | ICD-10-CM | POA: Diagnosis not present

## 2015-09-25 DIAGNOSIS — Z1371 Encounter for nonprocreative screening for genetic disease carrier status: Secondary | ICD-10-CM | POA: Insufficient documentation

## 2015-09-25 DIAGNOSIS — Z801 Family history of malignant neoplasm of trachea, bronchus and lung: Secondary | ICD-10-CM | POA: Insufficient documentation

## 2015-09-25 DIAGNOSIS — M4646 Discitis, unspecified, lumbar region: Secondary | ICD-10-CM | POA: Insufficient documentation

## 2015-09-25 DIAGNOSIS — R0602 Shortness of breath: Secondary | ICD-10-CM | POA: Insufficient documentation

## 2015-09-25 DIAGNOSIS — R5382 Chronic fatigue, unspecified: Secondary | ICD-10-CM | POA: Diagnosis not present

## 2015-09-25 DIAGNOSIS — Z8249 Family history of ischemic heart disease and other diseases of the circulatory system: Secondary | ICD-10-CM | POA: Insufficient documentation

## 2015-09-25 DIAGNOSIS — C561 Malignant neoplasm of right ovary: Secondary | ICD-10-CM

## 2015-09-25 DIAGNOSIS — M797 Fibromyalgia: Secondary | ICD-10-CM | POA: Insufficient documentation

## 2015-09-25 DIAGNOSIS — E785 Hyperlipidemia, unspecified: Secondary | ICD-10-CM | POA: Insufficient documentation

## 2015-09-25 DIAGNOSIS — Z95828 Presence of other vascular implants and grafts: Secondary | ICD-10-CM

## 2015-09-25 DIAGNOSIS — R928 Other abnormal and inconclusive findings on diagnostic imaging of breast: Secondary | ICD-10-CM

## 2015-09-25 LAB — COMPREHENSIVE METABOLIC PANEL
ALT: 16 U/L (ref 0–55)
AST: 14 U/L (ref 5–34)
Albumin: 3.8 g/dL (ref 3.5–5.0)
Alkaline Phosphatase: 96 U/L (ref 40–150)
Anion Gap: 10 mEq/L (ref 3–11)
BUN: 17.5 mg/dL (ref 7.0–26.0)
CO2: 29 mEq/L (ref 22–29)
Calcium: 9.8 mg/dL (ref 8.4–10.4)
Chloride: 102 mEq/L (ref 98–109)
Creatinine: 0.7 mg/dL (ref 0.6–1.1)
EGFR: 87 mL/min/{1.73_m2} — ABNORMAL LOW (ref >90)
Glucose: 90 mg/dl (ref 70–140)
Potassium: 3.5 mEq/L (ref 3.5–5.1)
Sodium: 140 mEq/L (ref 136–145)
Total Bilirubin: 0.4 mg/dL (ref 0.20–1.20)
Total Protein: 6.9 g/dL (ref 6.4–8.3)

## 2015-09-25 LAB — CBC WITH DIFFERENTIAL/PLATELET
BASO%: 0.9 % (ref 0.0–2.0)
Basophils Absolute: 0 10*3/uL (ref 0.0–0.1)
EOS%: 2 % (ref 0.0–7.0)
Eosinophils Absolute: 0.1 10*3/uL (ref 0.0–0.5)
HCT: 42.6 % (ref 34.8–46.6)
HGB: 14 g/dL (ref 11.6–15.9)
LYMPH%: 32.9 % (ref 14.0–49.7)
MCH: 29.2 pg (ref 25.1–34.0)
MCHC: 32.9 g/dL (ref 31.5–36.0)
MCV: 88.5 fL (ref 79.5–101.0)
MONO#: 0.5 10*3/uL (ref 0.1–0.9)
MONO%: 9.4 % (ref 0.0–14.0)
NEUT#: 2.9 10*3/uL (ref 1.5–6.5)
NEUT%: 54.8 % (ref 38.4–76.8)
Platelets: 150 10*3/uL (ref 145–400)
RBC: 4.81 10*6/uL (ref 3.70–5.45)
RDW: 14.8 % — ABNORMAL HIGH (ref 11.2–14.5)
WBC: 5.3 10*3/uL (ref 3.9–10.3)
lymph#: 1.7 10*3/uL (ref 0.9–3.3)

## 2015-09-25 MED ORDER — HEPARIN SOD (PORK) LOCK FLUSH 100 UNIT/ML IV SOLN
500.0000 [IU] | Freq: Once | INTRAVENOUS | Status: AC
  Administered 2015-09-25: 500 [IU] via INTRAVENOUS
  Filled 2015-09-25: qty 5

## 2015-09-25 MED ORDER — SODIUM CHLORIDE 0.9% FLUSH
10.0000 mL | INTRAVENOUS | Status: DC | PRN
  Administered 2015-09-25: 10 mL via INTRAVENOUS
  Filled 2015-09-25: qty 10

## 2015-09-25 NOTE — Patient Instructions (Addendum)
Follow-up with Dr. Marko Plume in 3-4 months and return to see [name] 3-4 months after your visit with Dr. Marko Plume. We will notify you of the results of your CA-125. In August 2014, it'll be 3 years since she completed her adjuvant therapy and we can start spreading of visits to every 6 months. We'll continue to alternate between Dr. let us say and our clinic.  We will make a referral to [facility] for potential hernia surgery.  Their contact number is [phone]

## 2015-09-25 NOTE — Progress Notes (Signed)
Consult Note: Gyn-Onc  Kristin Webb 70 y.o. female  CC:  Chief Complaint  Patient presents with  . fallopian tube cancer    MD follow up visit    HPI: Kristin Webb is a 70 year old, gravida 2 para 2, who noticed increasing abdominal swelling around the third week of January. Initially, the abdominal swelling was intermittent and would come and go. She related this to recent initiation of a calcium supplement. The abdominal swelling became more prevalent starting in February with abdominal pain reported also. In addition, she started having increasing constipation and formed stools, which was a change in her routine of 5-6 loose bowel movements a day chronically after her cholecystectomy. On November 01, 2012, she had a CT scan of the abdomen and pelvis which resulted no significant biliary dilation, no suspicious liver lesions, and the spleen, adrenal glands and pancreas appeared normal. 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 She has a large midabdominal mass measuring 17 x 22.7 cm x 20 cm cephalad, which was well circumscribed without calcifications. There is irregular thickened septations and areas of solid nodularity consistent with malignancy. There is no bowel obstruction and the mass appeared to be separate from the appendix. There was probable normal left ovarian tissue seen. The uterus is surgically absent. There is some soft tissue stranding at the base of the mesentery superior to the mass and some omental nodularity.   On November 08, 2012, she underwent an exploratory laparotomy, BSO, appendectomy, infracolic omentectomy, and optimal debulking. Operative findings included: 25 cm right adnexal mass with smooth surface. Surgically absent uterus. Atrophic-appearing left ovary. Normal appearing appendix. Within the omentum there were centimeter nodules scattered throughout the omentum. The remainder of the surfaces were  benign. Final pathology revealed:  1. 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 - BENIGN FALLOPIAN TUBE WITH NONSPECIFIC CHRONIC INFLAMMATION. 2. Ovary and fallopian tube, left - BENIGN OVARY; NEGATIVE FOR ATYPIA OR MALIGNANCY. - BENIGN FALLOPIAN TUBE; NEGATIVE FOR ATYPIA OR MALIGNANCY. 3. Omentum, resection for tumor - HIGH GRADE CARCINOMA, SEE COMMENT. 4. Appendix, Other than Incidental - FIBROUS OBLITERATION OF APPENDICEAL TIP. - NEGATIVE FOR MALIGNANCY.  She completed day 1, cycle 6 of 6 planned treatments of carboplatin and taxol on 03/28/13.   9/14 CT Findings: The liver is diffusely fatty infiltrated and measures 18.7 cm in cranial caudal length. No focal intrahepatic parenchymal abnormality. The spleen is unremarkable. The stomach, duodenum, pancreas and adrenal glands are unremarkable. The gallbladder is surgically absent. 11 x 9 x 11 mm nonobstructing stone is identified in the interpolar right kidney. Stable appearance of a 9 mm fatty lesion in the right kidney, likely a tiny angiomyolipoma. No abdominal aortic aneurysm. No free fluid in the abdomen. No abdominal lymphadenopathy. Haziness in the root of the small bowel mesentery is stable. Approximately 8 cm cranial to the umbilicus is an area of apparent focal midline fascial laxity. I cannot identify a discrete fascial defect to suggest an overt hernia. This is a wide-mouthed fascial bulge and some of the transverse colon projects out into this area of fascial bulging. No bowel wall thickening, adjacent edema, orfluid to suggest complication. Imaging through the pelvis shows no free intraperitoneal fluid. No pelvic sidewall lymphadenopathy. The uterus is surgically absent. The large complex cystic lesion seen  in the right central abdomen and pelvis has been resected in the interval. No substantial  diverticular disease in the colon. There is no colonic diverticulitis. Terminal ileum is normal. The appendix is not visualized, but there is no edema or inflammation in the region of the cecum. The right gonadal vein is enlarged and contains a central filling defect consistent with thrombus. Bone windows reveal no worrisome lytic or sclerotic osseous lesions.  IMPRESSION:  Interval resection of the large right pelvic and lower abdominal mass lesion with apparent omentectomy. No evidence for intraperitoneal free fluid on today's study. No discernible peritoneal lesions. 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  Interval History:  She was last seen by GYN oncology in 8/16. At that time her exam was negative. She was last seen by Dr. Marko Plume in 05/2015. Exam at that time was similarly unremarkable. Her CA 125 was 6 when last drawn. She comes in today for followup. She had a colonoscopy about 1-2 years ago. She is on GOG 225.  When she last saw Dr. Marko Plume she had noticed an increase in her stools to about 6-8 per day. She was seen by gastroenterology and had a workup. The Metamucil to her diet. Her stools are down about 4 times a day but she has much more noticed does not have the urgency that she had previously. She uses Colestid when necessary. She did have a kidney stone on her left kidney. This is been managed by [facility]. She had a stent placed as well as lithotripsy. The stent has been removed.  She hit her head about 4 weeks ago had no symptoms at the time but now she is having some headaches. She is feeling a little bit more wobbly and has impaired balance but she's not sure this could be related to her sinuses. She is feeling some sinus symptoms over the top of her head. There are no significant visual changes though occasionally she will have a bit of sparkling lights like an aura. She is going to call Dr. Baldomero Lamy office today. She's otherwise been feeling fairly well. She does wish  to have her hernia evaluated. She is going to be going to [place] with some friends for about a week's vacation. She is very excited about that.  CT:11/16 IMPRESSION: 1. 12 mm obstructive calculus at the left ureteropelvic junction with moderate proximal hydronephrosis. 2. 2 small supraumbilical ventral hernias, one containing a shor segment of the mid transverse colon and the other containing a short segment of the mid small bowel. There is no associated evidence to suggest bowel incarceration or obstruction at this time. 3. Tiny locule of gas non dependently in the lumen of the urinary bladder. This is presumably iatrogenic related to recent catheterization for urinalysis. Alternatively, this could be seen in the setting of urinary tract infection with gas-forming organisms. Clinical correlation for history of recent catheterization is recommended. 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 5. Status post cholecystectomy. 6. Additional incidental findings, as above.  Review of Systems: Constitutional:  She denies any fevers.   Skin:  No rash Cardiovascular: No chest pain,  No SOB Pulmonary: No cough Gastro Intestinal: No nausea, vomiting, constipation, + diarrhea as above. No bright red blood per rectum or change in bowel movement.  Genitourinary: Denies vaginal bleeding and discharge.  Musculoskeletal: She has some back pain. Neurologic: H/a as above Psychology:  No changes, doing well.  Current Meds:  Outpatient Encounter Prescriptions as of 09/25/2015  Medication Sig  . Calcium Citrate-Vitamin D (CALCIUM CITRATE + D PO) Take 1 capsule by mouth daily.   . cholestyramine (QUESTRAN) 4 GM/DOSE powder TAKE TWICE A DAY AS DIRECTED  . colestipol (COLESTID) 1 g tablet   . diphenhydrAMINE (BENADRYL) 25 mg capsule Take 50 mg by mouth at bedtime.   Marland Kitchen EPINEPHrine (EPIPEN 2-PAK) 0.3 mg/0.3 mL DEVI Inject 0.3 mg into the muscle once. For bee stings  . eszopiclone  (LUNESTA) 2 MG TABS tablet Take 1 tablet (2 mg total) by mouth at bedtime as needed for sleep. Take immediately before bedtime  . FLUoxetine (PROZAC) 10 MG capsule Take 10 mg by mouth daily.   Marland Kitchen FLUoxetine (PROZAC) 20 MG capsule Take 20 mg by mouth daily. Take with 10mg  tablet for total dose of 30mg  daily  . ibuprofen (ADVIL,MOTRIN) 200 MG tablet Take 200 mg by mouth every 6 (six) hours as needed for pain.  . Melatonin 5 MG TABS Take 10 mg by mouth at bedtime as needed.   . Multiple Vitamin (MULTIVITAMIN WITH MINERALS) TABS Take 1 tablet by mouth daily.  . rosuvastatin (CRESTOR) 10 MG tablet Take 10 mg by mouth daily.  Marland Kitchen triamterene-hydrochlorothiazide (MAXZIDE-25) 37.5-25 MG tablet Take 1 tablet by mouth daily.  . [DISCONTINUED] oxyCODONE-acetaminophen (ROXICET) 5-325 MG tablet Take 1 tablet by mouth every 4 (four) hours as needed for severe pain.  . [DISCONTINUED] sulfamethoxazole-trimethoprim (BACTRIM DS,SEPTRA DS) 800-160 MG tablet Take 1 tablet by mouth 2 (two) times daily.  . [DISCONTINUED] sodium chloride flush (NS) 0.9 % injection 10 mL    No facility-administered encounter medications on file as of 09/25/2015.    Allergy:  Allergies  Allergen Reactions  . Bee Venom Hives    Difficulty breathing, carries an EPI-pen  . Cortisone     Injected cortisone, "sick to my stomach, throwing up, hand swelling, and pain."    Social Hx:   Social History   Social History  . Marital Status: Widowed    Spouse Name: [name]  . Number of Children: 2  . Years of Education: Master's   Occupational History  . Not on file.   Social History Main Topics  . Smoking status: Never Smoker   . Smokeless tobacco: Never Used  . Alcohol Use: No  . Drug Use: No  . Sexual Activity: Not on file   Other Topics Concern  . Not on file   Social History Narrative   Patient is married [name]) and lives at home with her husband.   Patient has 2 children by birth and 13 children all together.    Patient has a Oceanographer.   Patient is right-handed.   Patient drinks very little caffeine.             Past Surgical Hx:  Past Surgical History  Procedure Laterality Date  . Cholecystectomy      early 43s  . Abdominal hysterectomy      early 1990s  . Trigger finger release    . Laparotomy Bilateral 11/08/2012    Procedure: EXPLORATORY LAPAROTOMY TOTAL ABDOMINAL HYSTERECTOMY BILATERAL SALPINGO OOPHORECTOMY TUMOR DEBULKING ;  Surgeon: Imagene Gurney A. Alycia Rossetti, MD;  Location: [facility];  Service: Gynecology;  Laterality: Bilateral;  APPENDECTOMY / OMENTECTOMY  . Appendectomy    . Exploratory laparotomy  11/08/12    BSO, appendectomy, omentectomy  . Cystoscopy w/ ureteral stent placement Left 07/09/2015    Procedure: CYSTOSCOPY WITH LEFT RETROGRADE PYELOGRAM/ LEFT URETERAL STENT PLACEMENT;  Surgeon: Ardis Hughs, MD;  Location: [facility];  Service: Urology;  Laterality: Left;    Past Medical Hx:  Past Medical History  Diagnosis Date  . Pelvic mass in female   . Constipation   . Diarrhea     in the past after gallbladder removal  . Hyperlipidemia   . Burning mouth syndrome   . Chronic fatigue   . Fibromyalgia   . Lumbar disc disease   . PONV (postoperative nausea and vomiting)   . Hypertension     borderline not on meds   . Shortness of breath     with exertion   . GERD (gastroesophageal reflux disease)   . Yeast infection   . Ovarian cancer ([place]) 2014  . Sleep apnea     ;neurologist informed pt she no longer needed her CPAP machine  . 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 negatve post treatmen CT 9/14    Fallopian tube  carcinoma ([place])   11/01/2012 Initial Diagnosis Fallopian tube carcinoma   11/08/2012 Surgery Debulking, optima. IA ovarian and IIIB fallopian tube   12/13/2012 - 03/28/2013 Chemotherapy s/p 6 cycles of paclitaxel and carboplatin    Family Hx:  Family History  Problem Relation Age of Onset  . Lung cancer Mother 78  . Lung cancer Father 32    2 ppd smoker  . High blood pressure Mother   . High Cholesterol Mother   . Parkinson's disease Father   . Kidney disease      Vitals:  Blood pressure 128/84, pulse 77, temperature 97.9 F (36.6 C), temperature source Oral, resp. rate 19, weight 172 lb 6.4 oz (78.2 kg), SpO2 98 %.  Physical Exam: General: Well developed, well nourished female in no acute distress. Alert and oriented x 3.   Head/Neck:  Supple without any enlargements.   Lymph node survey: No cervical, supraclavicular, or inguinal adenopathy.   Cardiovascular: Regular rate and rhythm.   Lungs: Clear to auscultation bilaterally.   Abdomen: Abdomen soft, non-tender and obese. Active bowel sounds in all quadrants. No evidence of a fluid wave. Midline abdominal incision well healed. Questionable hernia versus laxity of the abdominal wall. Feels more like a hernia today than a diastasis. Opening about 4-5 cm. Reducible.  Genitourinary:  Vulva/vagina: Normal external female genitalia. No lesions.  Urethra: No lesions or masses.  Vagina: No masses or nodularity Rectal: Good tone, no palpable masses or nodularity.   Extremities: No bilateral cyanosis, edema, or clubbing.   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 She was completely resected to an R0. She's completed her chemotherapy and had a post treatment CT scan that was negative in 9/14 and most recently in 11/16. Her last CA 125 was normal. It has never been elevated.  She has no evidence of recurrent  disease.  Per protocol she will follow up with Dr. Marko Plume in 3 months as scheduled and return to see me in approximately the 6 months. I've encouraged her to contact her primary care physician regarding her headaches. We will refer  her to general surgery for consideration of laparoscopic hernia repair and I will notify her of the results of her CA-125 from today.  Mckaela Howley A., MD 09/25/2015, 10:53 AM

## 2015-09-25 NOTE — Patient Instructions (Signed)

## 2015-09-26 LAB — CA 125: Cancer Antigen (CA) 125: 7 U/mL (ref 0.0–38.1)

## 2015-09-26 LAB — CANCER ANTIGEN 125 (PARALLEL TESTING): CA 125: 6 U/mL (ref <35)

## 2015-09-27 ENCOUNTER — Telehealth: Payer: Self-pay | Admitting: Gynecologic Oncology

## 2015-09-27 NOTE — Telephone Encounter (Signed)
Patient returned call about getting info on referral to Dr. Rosendo Gros.  Patient asking why she needs to have another mammogram.  Stating the person on the phone from the imaging center stated she just needed her images taken over again.  Informed her of the mammogram results with the finding of a possible mass and asymmetry of the left breast.  Verbalizing understanding.  Advised to contact Dr. Mariana Kaufman office for further questions.

## 2015-09-27 NOTE — Telephone Encounter (Signed)
[facility] contacted about referral for hernia repair per [name].    Appt made for March 6 at 1:20pm with Dr. Rosendo Gros.  12:21  Call to patient to inform her of her appt.  Message on voicemail says to call [phone] but that number is disconnected.  Called cell phone back and left message.  Advised her to call the office for any questions or concerns.

## 2015-09-30 ENCOUNTER — Telehealth: Payer: Self-pay

## 2015-09-30 NOTE — Telephone Encounter (Signed)
Told Ms. Trapani the results of the CA-125 as noted below by Dr. Marko Plume.

## 2015-09-30 NOTE — Telephone Encounter (Signed)
-----   Message from Gordy Levan, MD sent at 09/26/2015  2:39 PM EST ----- Labs seen and need follow up: please let her know labs look good, including CA 125 of 7

## 2015-10-10 ENCOUNTER — Ambulatory Visit
Admission: RE | Admit: 2015-10-10 | Discharge: 2015-10-10 | Disposition: A | Discharge status: Home / Self Care | Payer: Commercial Managed Care - HMO | Source: Ambulatory Visit | Attending: Oncology | Admitting: Oncology

## 2015-10-10 DIAGNOSIS — R928 Other abnormal and inconclusive findings on diagnostic imaging of breast: Secondary | ICD-10-CM

## 2015-10-10 DIAGNOSIS — N63 Unspecified lump in breast: Secondary | ICD-10-CM | POA: Diagnosis not present

## 2015-10-11 DIAGNOSIS — J069 Acute upper respiratory infection, unspecified: Secondary | ICD-10-CM | POA: Diagnosis not present

## 2015-10-14 ENCOUNTER — Ambulatory Visit: Payer: Self-pay | Admitting: General Surgery

## 2015-10-14 DIAGNOSIS — K432 Incisional hernia without obstruction or gangrene: Secondary | ICD-10-CM | POA: Diagnosis not present

## 2015-10-14 DIAGNOSIS — D3001 Benign neoplasm of right kidney: Secondary | ICD-10-CM | POA: Diagnosis not present

## 2015-10-14 DIAGNOSIS — N201 Calculus of ureter: Secondary | ICD-10-CM | POA: Diagnosis not present

## 2015-10-14 DIAGNOSIS — Z0000 Encounter for general adult medical examination without abnormal findings: Secondary | ICD-10-CM | POA: Diagnosis not present

## 2015-10-14 NOTE — H&P (Signed)
History of Present Illness Kristin Ok MD; 10/14/2015 2:01 PM) Patient words: hernia.  The patient is a 70 year old female who presents with an incisional hernia. Patient is a 70 year old female who is referred by Dr. Stephannie Peters for evaluation of an incisional hernia. Patient has undergone multiple GYN operations. Most recently in 2014 she underwent exploratory laparoscopy, abdominal hysterectomy, bilateral salpingo-oophorectomy and tumor removal. Patient states that soon thereafter she noticed a bulge upper portion of her abdomen. Patient has had some discomfort secondary to this weakness of her abdominal wall and pressure hernia. Patient recently November 2016 underwent a CT scan which revealed approximately a 0.3 cm incisional hernia.  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  Patient does do some yoga, gardening, walking. She states she's lost approximately 20 pounds in the last 2 years. She continues to want to lose weight.   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 Famotidine (20MG  Tablet, Oral) Active. FLUoxetine HCl (10MG  Capsule, Oral) Active. Rosuvastatin Calcium (20MG  Tablet, Oral) Active. Triamterene-HCTZ (37.5-25MG  Tablet, Oral) Active. Triamcinolone Acetonide (0.5% Cream, External) Active. Cholestyramine (4GM/DOSE Powder, Oral) Active. Oxycodone-Acetaminophen (5-325MG   Tablet, Oral as needed) Active. Eszopiclone (2MG  Tablet, Oral) Active. FLUoxetine HCl (20MG  Capsule, Oral) Active. Tamsulosin HCl (0.4MG  Capsule, Oral) Active. Triamcinolone Acetonide (0.025% Cream, External as needed) Active. Medications Reconciled  Family History Marjean Donna, CMA; 10/14/2015 1:30 PM) Cancer Mother. Respiratory Condition Mother.  Pregnancy / Birth History Marjean Donna, [place]; 10/14/2015 1:30 PM) Age at menarche 58 years. Age of menopause 52-50 Contraceptive History Contraceptive implant. Gravida 2 Para 2    Review of Systems (Sonya Bynum CMA; 10/14/2015 1:30 PM) General Present- Chills and Fatigue. Not Present- Appetite Loss, Fever, Night Sweats, Weight Gain and Weight Loss. HEENT Present- Earache, Hoarseness and Oral Ulcers. Not Present- Hearing Loss, Nose Bleed, Ringing in the Ears, Seasonal Allergies, Sinus Pain, Sore Throat, Visual Disturbances, Wears glasses/contact lenses and Yellow Eyes. Respiratory Present- Chronic Cough. Not Present- Bloody sputum, Difficulty Breathing, Snoring and Wheezing. Gastrointestinal Present- Bloating and Chronic diarrhea. Not Present- Abdominal Pain, Bloody Stool, Change in Bowel Habits, Constipation, Difficulty Swallowing, Excessive gas, Gets full quickly at meals, Hemorrhoids, Indigestion, Nausea, Rectal Pain and Vomiting. Musculoskeletal Present- Back Pain. Not Present- Joint Pain, Joint Stiffness, Muscle Pain, Muscle Weakness and Swelling of Extremities.  Vitals (Sonya Bynum CMA; 10/14/2015 1:30 PM) 10/14/2015 1:30 PM Weight: 171 lb Height: 60in Body Surface Area: 1.75 m Body Mass Index: 33.4 kg/m  Temp.: 34F(Temporal)  Pulse: 83 (Regular)  BP: 130/74 (Sitting, Left Arm, Standard)       Physical Exam Kristin Ok MD; 10/14/2015 2:01 PM) General Mental Status-Alert. General Appearance-Consistent with stated age. Hydration-Well hydrated. Voice-Normal.  Chest and Lung Exam Chest and lung  exam reveals -quiet, even and easy respiratory effort with no use of accessory muscles and on auscultation, normal breath sounds, no adventitious sounds and normal vocal resonance. Inspection Chest Wall - Normal. Back - normal.  Cardiovascular Cardiovascular examination reveals -normal heart sounds, regular  rate and rhythm with no murmurs and normal pedal pulses bilaterally.  Abdomen Note: Midline incision, upper abdominal hernia, reducible     Assessment & Plan Kristin Ok MD; 10/14/2015 2:02 PM) INCISIONAL HERNIA, WITHOUT OBSTRUCTION OR GANGRENE (K43.2) Impression: Patient is a 70 year old female with an incisional upper midline hernia.  1. I had a long discussion with the patient in regards to open repair. Patient would require exploratory laparoscopy, lysis of adhesions, TAR hernia repair with mesh. The patient will like to proceed at this time. 2. I discussed with her the risks and benefits of procedure to include but not limited to: Infection, bleeding, structures, possible recurrence, possible need for further surgery. The patient was understanding and wished to proceed.

## 2015-11-15 ENCOUNTER — Other Ambulatory Visit: Payer: Commercial Managed Care - HMO

## 2015-11-15 ENCOUNTER — Ambulatory Visit (HOSPITAL_BASED_OUTPATIENT_CLINIC_OR_DEPARTMENT_OTHER): Payer: Commercial Managed Care - HMO

## 2015-11-15 VITALS — BP 142/75 | HR 82 | Temp 98.1°F | Resp 16

## 2015-11-15 DIAGNOSIS — C5701 Malignant neoplasm of right fallopian tube: Secondary | ICD-10-CM

## 2015-11-15 DIAGNOSIS — Z452 Encounter for adjustment and management of vascular access device: Secondary | ICD-10-CM | POA: Diagnosis not present

## 2015-11-15 DIAGNOSIS — C5700 Malignant neoplasm of unspecified fallopian tube: Secondary | ICD-10-CM

## 2015-11-15 DIAGNOSIS — Z95828 Presence of other vascular implants and grafts: Secondary | ICD-10-CM

## 2015-11-15 MED ORDER — HEPARIN SOD (PORK) LOCK FLUSH 100 UNIT/ML IV SOLN
500.0000 [IU] | Freq: Once | INTRAVENOUS | Status: AC
  Administered 2015-11-15: 500 [IU] via INTRAVENOUS
  Filled 2015-11-15: qty 5

## 2015-11-15 MED ORDER — SODIUM CHLORIDE 0.9% FLUSH
10.0000 mL | INTRAVENOUS | Status: DC | PRN
  Administered 2015-11-15: 10 mL via INTRAVENOUS
  Filled 2015-11-15: qty 10

## 2015-11-15 NOTE — Patient Instructions (Signed)

## 2015-11-16 LAB — CA 125: Cancer Antigen (CA) 125: 14.4 U/mL (ref 0.0–38.1)

## 2015-11-18 LAB — CANCER ANTIGEN 125 (PARALLEL TESTING): CA 125: 7 U/mL (ref <35)

## 2015-11-19 DIAGNOSIS — R197 Diarrhea, unspecified: Secondary | ICD-10-CM | POA: Diagnosis not present

## 2015-11-20 ENCOUNTER — Encounter: Payer: Self-pay | Admitting: Gynecologic Oncology

## 2015-12-11 ENCOUNTER — Other Ambulatory Visit: Payer: Self-pay | Admitting: Gastroenterology

## 2015-12-11 DIAGNOSIS — D120 Benign neoplasm of cecum: Secondary | ICD-10-CM | POA: Diagnosis not present

## 2015-12-11 DIAGNOSIS — R197 Diarrhea, unspecified: Secondary | ICD-10-CM | POA: Diagnosis not present

## 2015-12-11 DIAGNOSIS — K529 Noninfective gastroenteritis and colitis, unspecified: Secondary | ICD-10-CM | POA: Diagnosis not present

## 2015-12-11 DIAGNOSIS — D126 Benign neoplasm of colon, unspecified: Secondary | ICD-10-CM | POA: Diagnosis not present

## 2015-12-11 DIAGNOSIS — D124 Benign neoplasm of descending colon: Secondary | ICD-10-CM | POA: Diagnosis not present

## 2015-12-19 DIAGNOSIS — L739 Follicular disorder, unspecified: Secondary | ICD-10-CM | POA: Diagnosis not present

## 2015-12-19 DIAGNOSIS — L258 Unspecified contact dermatitis due to other agents: Secondary | ICD-10-CM | POA: Diagnosis not present

## 2015-12-20 ENCOUNTER — Other Ambulatory Visit: Payer: Self-pay | Admitting: Neurology

## 2015-12-20 DIAGNOSIS — F5105 Insomnia due to other mental disorder: Secondary | ICD-10-CM

## 2015-12-20 DIAGNOSIS — F418 Other specified anxiety disorders: Secondary | ICD-10-CM

## 2015-12-20 DIAGNOSIS — K146 Glossodynia: Secondary | ICD-10-CM

## 2015-12-20 DIAGNOSIS — G4700 Insomnia, unspecified: Secondary | ICD-10-CM

## 2015-12-20 MED ORDER — ESZOPICLONE 2 MG PO TABS: 2.0000 mg | ORAL_TABLET | Freq: Every evening | ORAL | Status: DC | PRN

## 2015-12-20 NOTE — Telephone Encounter (Signed)
Faxed to [facility] [phone].  (confirmation received).

## 2015-12-20 NOTE — Telephone Encounter (Signed)
Patient requesting refill of eszopiclone (LUNESTA) 2 MG TABS tablet Pharmacy: [facility] Patient requests renewal of this medication prior to Tuesday, having surgery Wednesday Morning.

## 2015-12-20 NOTE — Telephone Encounter (Signed)
Patient also advised she is completely out of this medication, "didn't realize it was out of refills".

## 2015-12-20 NOTE — Telephone Encounter (Signed)
i refilled the prescription , but the pharamcy will not have it today unless faxed.. Not an e script. CD

## 2015-12-23 ENCOUNTER — Encounter (HOSPITAL_COMMUNITY)
Admission: RE | Admit: 2015-12-23 | Discharge: 2015-12-23 | Disposition: A | Discharge status: Home / Self Care | Payer: Commercial Managed Care - HMO | Source: Ambulatory Visit | Attending: General Surgery | Admitting: General Surgery

## 2015-12-23 ENCOUNTER — Other Ambulatory Visit: Payer: Self-pay

## 2015-12-23 ENCOUNTER — Encounter (HOSPITAL_COMMUNITY): Payer: Self-pay

## 2015-12-23 DIAGNOSIS — Z79899 Other long term (current) drug therapy: Secondary | ICD-10-CM | POA: Diagnosis not present

## 2015-12-23 DIAGNOSIS — Z8543 Personal history of malignant neoplasm of ovary: Secondary | ICD-10-CM | POA: Diagnosis not present

## 2015-12-23 DIAGNOSIS — F5105 Insomnia due to other mental disorder: Secondary | ICD-10-CM

## 2015-12-23 DIAGNOSIS — K430 Incisional hernia with obstruction, without gangrene: Secondary | ICD-10-CM | POA: Diagnosis present

## 2015-12-23 DIAGNOSIS — G4700 Insomnia, unspecified: Secondary | ICD-10-CM

## 2015-12-23 DIAGNOSIS — F418 Other specified anxiety disorders: Secondary | ICD-10-CM

## 2015-12-23 DIAGNOSIS — K432 Incisional hernia without obstruction or gangrene: Secondary | ICD-10-CM | POA: Diagnosis present

## 2015-12-23 DIAGNOSIS — K219 Gastro-esophageal reflux disease without esophagitis: Secondary | ICD-10-CM | POA: Diagnosis present

## 2015-12-23 DIAGNOSIS — K146 Glossodynia: Secondary | ICD-10-CM

## 2015-12-23 DIAGNOSIS — K660 Peritoneal adhesions (postprocedural) (postinfection): Secondary | ICD-10-CM | POA: Diagnosis present

## 2015-12-23 DIAGNOSIS — M797 Fibromyalgia: Secondary | ICD-10-CM | POA: Diagnosis present

## 2015-12-23 DIAGNOSIS — I10 Essential (primary) hypertension: Secondary | ICD-10-CM | POA: Diagnosis present

## 2015-12-23 DIAGNOSIS — Z809 Family history of malignant neoplasm, unspecified: Secondary | ICD-10-CM | POA: Diagnosis not present

## 2015-12-23 HISTORY — DX: Personal history of urinary calculi: Z87.442

## 2015-12-23 LAB — BASIC METABOLIC PANEL
Anion gap: 8 (ref 5–15)
BUN: 17 mg/dL (ref 6–20)
CO2: 28 mmol/L (ref 22–32)
Calcium: 10 mg/dL (ref 8.9–10.3)
Chloride: 105 mmol/L (ref 101–111)
Creatinine, Ser: 0.6 mg/dL (ref 0.44–1.00)
GFR calc Af Amer: >60 mL/min (ref >60)
GFR calc non Af Amer: >60 mL/min (ref >60)
Glucose, Bld: 103 mg/dL — ABNORMAL HIGH (ref 65–99)
Potassium: 5.1 mmol/L (ref 3.5–5.1)
Sodium: 141 mmol/L (ref 135–145)

## 2015-12-23 LAB — CBC
HCT: 43.7 % (ref 36.0–46.0)
Hemoglobin: 13.9 g/dL (ref 12.0–15.0)
MCH: 28.5 pg (ref 26.0–34.0)
MCHC: 31.8 g/dL (ref 30.0–36.0)
MCV: 89.5 fL (ref 78.0–100.0)
Platelets: 164 10*3/uL (ref 150–400)
RBC: 4.88 MIL/uL (ref 3.87–5.11)
RDW: 13.7 % (ref 11.5–15.5)
WBC: 6.1 10*3/uL (ref 4.0–10.5)

## 2015-12-23 MED ORDER — ESZOPICLONE 2 MG PO TABS: 2.0000 mg | ORAL_TABLET | Freq: Every evening | ORAL | Status: DC | PRN

## 2015-12-23 NOTE — Patient Instructions (Addendum)
Kristin Webb  12/23/2015   Your procedure is scheduled on: Wednesday 12/25/2015  Report to [facility] Main  Entrance take [facility]  elevators to 3rd floor to  [place] at [place] AM.  Call this number if you have problems the morning of surgery [phone]   Remember: ONLY 1 PERSON MAY GO WITH YOU TO SHORT STAY TO GET  READY MORNING OF [name].   Do not eat food or drink liquids :After Midnight.     Take these medicines the morning of surgery with A SIP OF WATER: Prozac                                 You may not have any metal on your body including hair pins and              piercings  Do not wear jewelry, make-up, lotions, powders or perfumes, deodorant             Do not wear nail polish.  Do not shave  48 hours prior to surgery.              Men may shave face and neck.   Do not bring valuables to the hospital. Kristin Webb.  Contacts, dentures or bridgework may not be worn into surgery.  Leave suitcase in the car. After surgery it may be brought to your room.                  Please read over the following fact sheets you were given: _____________________________________________________________________             [facility] - Preparing for Surgery Before surgery, you can play an important role.  Because skin is not sterile, your skin needs to be as free of germs as possible.  You can reduce the number of germs on your skin by washing with CHG (chlorahexidine gluconate) soap before surgery.  CHG is an antiseptic cleaner which kills germs and bonds with the skin to continue killing germs even after washing. Please DO NOT use if you have an allergy to CHG or antibacterial soaps.  If your skin becomes reddened/irritated stop using the CHG and inform your nurse when you arrive at Short Stay. Do not shave (including legs and underarms) for at least 48 hours prior to the first CHG shower.  You may  shave your face/neck. Please follow these instructions carefully:  1.  Shower with CHG Soap the night before surgery and the  morning of Surgery.  2.  If you choose to wash your hair, wash your hair first as usual with your  normal  shampoo.  3.  After you shampoo, rinse your hair and body thoroughly to remove the  shampoo.                           4.  Use CHG as you would any other liquid soap.  You can apply chg directly  to the skin and wash                       Gently with a scrungie or clean washcloth.  5.  Apply the  CHG Soap to your body ONLY FROM THE NECK DOWN.   Do not use on face/ open                           Wound or open sores. Avoid contact with eyes, ears mouth and genitals (private parts).                       Wash face,  Genitals (private parts) with your normal soap.             6.  Wash thoroughly, paying special attention to the area where your surgery  will be performed.  7.  Thoroughly rinse your body with warm water from the neck down.  8.  DO NOT shower/wash with your normal soap after using and rinsing off  the CHG Soap.                9.  Pat yourself dry with a clean towel.            10.  Wear clean pajamas.            11.  Place clean sheets on your bed the night of your first shower and do not  sleep with pets. Day of Surgery : Do not apply any lotions/deodorants the morning of surgery.  Please wear clean clothes to the hospital/surgery center.  FAILURE TO FOLLOW THESE INSTRUCTIONS MAY RESULT IN THE CANCELLATION OF YOUR SURGERY PATIENT SIGNATURE_________________________________  NURSE SIGNATURE__________________________________  ________________________________________________________________________

## 2015-12-23 NOTE — Progress Notes (Signed)
07/09/15- noted in EPIC- EKG and CT abd./pelvis.

## 2015-12-24 NOTE — Telephone Encounter (Signed)
[facility] [address] Received a receipt of confirmation.

## 2015-12-24 NOTE — Anesthesia Preprocedure Evaluation (Addendum)
Anesthesia Evaluation  Patient identified by MRN, date of birth, ID band Patient awake    Reviewed: Allergy & Precautions, H&P , NPO status , Patient's Chart, lab work & pertinent test results  History of Anesthesia Complications (+) PONV  Airway Mallampati: II  TM Distance: >3 FB Neck ROM: Full    Dental no notable dental hx. (+) Teeth Intact, Dental Advisory Given   Pulmonary neg pulmonary ROS,    Pulmonary exam normal breath sounds clear to auscultation       Cardiovascular hypertension, Pt. on medications  Rhythm:Regular Rate:Normal     Neuro/Psych negative neurological ROS  negative psych ROS   GI/Hepatic Neg liver ROS, GERD  Medicated and Controlled,  Endo/Other  negative endocrine ROS  Renal/GU Renal disease  negative genitourinary   Musculoskeletal  (+) Fibromyalgia -  Abdominal   Peds  Hematology negative hematology ROS (+)   Anesthesia Other Findings   Reproductive/Obstetrics negative OB ROS                            Anesthesia Physical Anesthesia Plan  ASA: II  Anesthesia Plan: General   Post-op Pain Management:    Induction: Intravenous  Airway Management Planned: Oral ETT  Additional Equipment:   Intra-op Plan:   Post-operative Plan: Extubation in OR  Informed Consent: I have reviewed the patients History and Physical, chart, labs and discussed the procedure including the risks, benefits and alternatives for the proposed anesthesia with the patient or authorized representative who has indicated his/her understanding and acceptance.   Dental advisory given  Plan Discussed with: CRNA  Anesthesia Plan Comments:        Anesthesia Quick Evaluation

## 2015-12-25 ENCOUNTER — Encounter (HOSPITAL_COMMUNITY)
Admission: RE | Disposition: A | Discharge status: Home / Self Care | Payer: Self-pay | Source: Ambulatory Visit | Attending: General Surgery

## 2015-12-25 ENCOUNTER — Inpatient Hospital Stay (HOSPITAL_COMMUNITY)
Admission: RE | Admit: 2015-12-25 | Discharge: 2015-12-29 | DRG: 355 | Disposition: A | Discharge status: Home / Self Care | Payer: Commercial Managed Care - HMO | Source: Ambulatory Visit | Attending: General Surgery | Admitting: General Surgery

## 2015-12-25 ENCOUNTER — Encounter (HOSPITAL_COMMUNITY): Payer: Self-pay | Admitting: *Deleted

## 2015-12-25 ENCOUNTER — Ambulatory Visit: Payer: Commercial Managed Care - HMO | Admitting: Neurology

## 2015-12-25 ENCOUNTER — Inpatient Hospital Stay (HOSPITAL_COMMUNITY): Payer: Commercial Managed Care - HMO | Admitting: Anesthesiology

## 2015-12-25 DIAGNOSIS — K660 Peritoneal adhesions (postprocedural) (postinfection): Secondary | ICD-10-CM | POA: Diagnosis present

## 2015-12-25 DIAGNOSIS — K430 Incisional hernia with obstruction, without gangrene: Secondary | ICD-10-CM | POA: Diagnosis present

## 2015-12-25 DIAGNOSIS — M797 Fibromyalgia: Secondary | ICD-10-CM | POA: Diagnosis present

## 2015-12-25 DIAGNOSIS — Z8719 Personal history of other diseases of the digestive system: Secondary | ICD-10-CM

## 2015-12-25 DIAGNOSIS — Z79899 Other long term (current) drug therapy: Secondary | ICD-10-CM | POA: Diagnosis not present

## 2015-12-25 DIAGNOSIS — Z8543 Personal history of malignant neoplasm of ovary: Secondary | ICD-10-CM | POA: Diagnosis not present

## 2015-12-25 DIAGNOSIS — I10 Essential (primary) hypertension: Secondary | ICD-10-CM | POA: Diagnosis present

## 2015-12-25 DIAGNOSIS — K219 Gastro-esophageal reflux disease without esophagitis: Secondary | ICD-10-CM | POA: Diagnosis present

## 2015-12-25 DIAGNOSIS — Z809 Family history of malignant neoplasm, unspecified: Secondary | ICD-10-CM

## 2015-12-25 DIAGNOSIS — K432 Incisional hernia without obstruction or gangrene: Secondary | ICD-10-CM | POA: Diagnosis present

## 2015-12-25 DIAGNOSIS — Z9889 Other specified postprocedural states: Secondary | ICD-10-CM

## 2015-12-25 HISTORY — PX: INSERTION OF MESH: SHX5868

## 2015-12-25 HISTORY — PX: LYSIS OF ADHESION: SHX5961

## 2015-12-25 HISTORY — PX: LAPAROTOMY: SHX154

## 2015-12-25 HISTORY — PX: INCISIONAL HERNIA REPAIR: SHX193

## 2015-12-25 LAB — CBC
HCT: 38.3 % (ref 36.0–46.0)
Hemoglobin: 12.9 g/dL (ref 12.0–15.0)
MCH: 29.3 pg (ref 26.0–34.0)
MCHC: 33.7 g/dL (ref 30.0–36.0)
MCV: 86.8 fL (ref 78.0–100.0)
Platelets: 135 10*3/uL — ABNORMAL LOW (ref 150–400)
RBC: 4.41 MIL/uL (ref 3.87–5.11)
RDW: 13.6 % (ref 11.5–15.5)
WBC: 10.2 10*3/uL (ref 4.0–10.5)

## 2015-12-25 LAB — CREATININE, SERUM
Creatinine, Ser: 0.72 mg/dL (ref 0.44–1.00)
GFR calc Af Amer: >60 mL/min (ref >60)
GFR calc non Af Amer: >60 mL/min (ref >60)

## 2015-12-25 SURGERY — LAPAROTOMY, EXPLORATORY
Anesthesia: General

## 2015-12-25 MED ORDER — ONDANSETRON HCL 4 MG/2ML IJ SOLN
INTRAMUSCULAR | Status: AC
  Filled 2015-12-25: qty 2

## 2015-12-25 MED ORDER — ROCURONIUM BROMIDE 100 MG/10ML IV SOLN
INTRAVENOUS | Status: DC | PRN
  Administered 2015-12-25 (×2): 10 mg via INTRAVENOUS
  Administered 2015-12-25: 40 mg via INTRAVENOUS
  Administered 2015-12-25: 10 mg via INTRAVENOUS

## 2015-12-25 MED ORDER — HYDROMORPHONE HCL 1 MG/ML IJ SOLN
INTRAMUSCULAR | Status: AC
  Filled 2015-12-25: qty 1

## 2015-12-25 MED ORDER — HYDRALAZINE HCL 20 MG/ML IJ SOLN
INTRAMUSCULAR | Status: DC | PRN
  Administered 2015-12-25: 5 mg via INTRAVENOUS

## 2015-12-25 MED ORDER — SUGAMMADEX SODIUM 200 MG/2ML IV SOLN
INTRAVENOUS | Status: DC | PRN
  Administered 2015-12-25: 200 mg via INTRAVENOUS

## 2015-12-25 MED ORDER — ONDANSETRON 4 MG PO TBDP
4.0000 mg | ORAL_TABLET | Freq: Four times a day (QID) | ORAL | Status: DC | PRN
  Administered 2015-12-27 – 2015-12-29 (×6): 4 mg via ORAL
  Filled 2015-12-25 (×6): qty 1

## 2015-12-25 MED ORDER — SUCCINYLCHOLINE CHLORIDE 20 MG/ML IJ SOLN
INTRAMUSCULAR | Status: DC | PRN
  Administered 2015-12-25: 100 mg via INTRAVENOUS

## 2015-12-25 MED ORDER — PROPOFOL 10 MG/ML IV BOLUS
INTRAVENOUS | Status: DC | PRN
  Administered 2015-12-25: 200 mg via INTRAVENOUS

## 2015-12-25 MED ORDER — LIDOCAINE HCL (CARDIAC) 20 MG/ML IV SOLN
INTRAVENOUS | Status: DC | PRN
  Administered 2015-12-25: 100 mg via INTRAVENOUS

## 2015-12-25 MED ORDER — DIPHENHYDRAMINE HCL 50 MG/ML IJ SOLN: 12.5000 mg | Freq: Four times a day (QID) | INTRAMUSCULAR | Status: DC | PRN

## 2015-12-25 MED ORDER — DEXAMETHASONE SODIUM PHOSPHATE 10 MG/ML IJ SOLN
INTRAMUSCULAR | Status: DC | PRN
  Administered 2015-12-25: 10 mg via INTRAVENOUS

## 2015-12-25 MED ORDER — LACTATED RINGERS IV SOLN
INTRAVENOUS | Status: DC | PRN
  Administered 2015-12-25 (×2): via INTRAVENOUS

## 2015-12-25 MED ORDER — ROCURONIUM BROMIDE 50 MG/5ML IV SOLN
INTRAVENOUS | Status: AC
  Filled 2015-12-25: qty 2

## 2015-12-25 MED ORDER — SODIUM CHLORIDE 0.9 % IJ SOLN
INTRAMUSCULAR | Status: AC
  Filled 2015-12-25: qty 10

## 2015-12-25 MED ORDER — ONDANSETRON HCL 4 MG/2ML IJ SOLN: 4.0000 mg | Freq: Four times a day (QID) | INTRAMUSCULAR | Status: DC | PRN

## 2015-12-25 MED ORDER — EPHEDRINE SULFATE 50 MG/ML IJ SOLN
INTRAMUSCULAR | Status: AC
  Filled 2015-12-25: qty 1

## 2015-12-25 MED ORDER — CHLORHEXIDINE GLUCONATE 4 % EX LIQD: 1.0000 "application " | Freq: Once | CUTANEOUS | Status: DC

## 2015-12-25 MED ORDER — MIDAZOLAM HCL 5 MG/5ML IJ SOLN
INTRAMUSCULAR | Status: DC | PRN
  Administered 2015-12-25 (×2): 1 mg via INTRAVENOUS

## 2015-12-25 MED ORDER — CEFAZOLIN SODIUM-DEXTROSE 2-4 GM/100ML-% IV SOLN
2.0000 g | Freq: Three times a day (TID) | INTRAVENOUS | Status: AC
  Administered 2015-12-25 – 2015-12-26 (×3): 2 g via INTRAVENOUS
  Filled 2015-12-25 (×4): qty 100

## 2015-12-25 MED ORDER — PROMETHAZINE HCL 25 MG/ML IJ SOLN
INTRAMUSCULAR | Status: AC
  Filled 2015-12-25: qty 1

## 2015-12-25 MED ORDER — MIDAZOLAM HCL 2 MG/2ML IJ SOLN
INTRAMUSCULAR | Status: AC
  Filled 2015-12-25: qty 2

## 2015-12-25 MED ORDER — DIPHENHYDRAMINE HCL 12.5 MG/5ML PO ELIX
12.5000 mg | ORAL_SOLUTION | Freq: Four times a day (QID) | ORAL | Status: DC | PRN
  Filled 2015-12-25: qty 5

## 2015-12-25 MED ORDER — OXYCODONE HCL 5 MG PO TABS
5.0000 mg | ORAL_TABLET | ORAL | Status: DC | PRN
  Administered 2015-12-25: 5 mg via ORAL
  Administered 2015-12-25 – 2015-12-29 (×11): 10 mg via ORAL
  Filled 2015-12-25: qty 2
  Filled 2015-12-25 (×3): qty 1
  Filled 2015-12-25 (×9): qty 2

## 2015-12-25 MED ORDER — ONDANSETRON HCL 4 MG/2ML IJ SOLN
INTRAMUSCULAR | Status: DC | PRN
  Administered 2015-12-25 (×2): 4 mg via INTRAVENOUS

## 2015-12-25 MED ORDER — CEFAZOLIN SODIUM-DEXTROSE 2-4 GM/100ML-% IV SOLN
INTRAVENOUS | Status: AC
  Filled 2015-12-25: qty 100

## 2015-12-25 MED ORDER — DEXAMETHASONE SODIUM PHOSPHATE 10 MG/ML IJ SOLN
INTRAMUSCULAR | Status: AC
  Filled 2015-12-25: qty 1

## 2015-12-25 MED ORDER — DEXTROSE 5 % IV SOLN
2.0000 g | INTRAVENOUS | Status: AC
  Administered 2015-12-25: 2 g via INTRAVENOUS
  Filled 2015-12-25: qty 20

## 2015-12-25 MED ORDER — SUGAMMADEX SODIUM 200 MG/2ML IV SOLN
INTRAVENOUS | Status: AC
  Filled 2015-12-25: qty 2

## 2015-12-25 MED ORDER — ENOXAPARIN SODIUM 40 MG/0.4ML ~~LOC~~ SOLN
40.0000 mg | SUBCUTANEOUS | Status: DC
  Administered 2015-12-25 – 2015-12-28 (×4): 40 mg via SUBCUTANEOUS
  Filled 2015-12-25 (×4): qty 0.4

## 2015-12-25 MED ORDER — DEXTROSE-NACL 5-0.9 % IV SOLN
INTRAVENOUS | Status: DC
  Administered 2015-12-25 – 2015-12-26 (×2): via INTRAVENOUS
  Administered 2015-12-26 – 2015-12-27 (×2): 75 mL/h via INTRAVENOUS

## 2015-12-25 MED ORDER — SCOPOLAMINE 1 MG/3DAYS TD PT72
MEDICATED_PATCH | TRANSDERMAL | Status: AC
  Filled 2015-12-25: qty 1

## 2015-12-25 MED ORDER — PROPOFOL 10 MG/ML IV BOLUS
INTRAVENOUS | Status: AC
  Filled 2015-12-25: qty 20

## 2015-12-25 MED ORDER — 0.9 % SODIUM CHLORIDE (POUR BTL) OPTIME
TOPICAL | Status: DC | PRN
  Administered 2015-12-25: 2000 mL

## 2015-12-25 MED ORDER — PROMETHAZINE HCL 25 MG/ML IJ SOLN
6.2500 mg | Freq: Once | INTRAMUSCULAR | Status: AC
  Administered 2015-12-25: 6.25 mg via INTRAVENOUS

## 2015-12-25 MED ORDER — HYDROMORPHONE HCL 1 MG/ML IJ SOLN
1.0000 mg | INTRAMUSCULAR | Status: DC | PRN
  Administered 2015-12-26 – 2015-12-27 (×2): 1 mg via INTRAVENOUS
  Filled 2015-12-25 (×3): qty 1

## 2015-12-25 MED ORDER — FENTANYL CITRATE (PF) 100 MCG/2ML IJ SOLN
INTRAMUSCULAR | Status: DC | PRN
  Administered 2015-12-25: 50 ug via INTRAVENOUS
  Administered 2015-12-25: 100 ug via INTRAVENOUS
  Administered 2015-12-25 (×4): 50 ug via INTRAVENOUS

## 2015-12-25 MED ORDER — FENTANYL CITRATE (PF) 250 MCG/5ML IJ SOLN
INTRAMUSCULAR | Status: AC
  Filled 2015-12-25: qty 5

## 2015-12-25 MED ORDER — HYDROMORPHONE HCL 1 MG/ML IJ SOLN
0.2500 mg | INTRAMUSCULAR | Status: DC | PRN
  Administered 2015-12-25 (×2): 0.25 mg via INTRAVENOUS

## 2015-12-25 MED ORDER — SIMETHICONE 80 MG PO CHEW
40.0000 mg | CHEWABLE_TABLET | Freq: Four times a day (QID) | ORAL | Status: DC | PRN
  Administered 2015-12-27 – 2015-12-29 (×6): 40 mg via ORAL
  Filled 2015-12-25 (×6): qty 1

## 2015-12-25 MED ORDER — SCOPOLAMINE 1 MG/3DAYS TD PT72
MEDICATED_PATCH | TRANSDERMAL | Status: DC | PRN
  Administered 2015-12-25: 1 via TRANSDERMAL

## 2015-12-25 MED ORDER — LIDOCAINE HCL (CARDIAC) 20 MG/ML IV SOLN
INTRAVENOUS | Status: AC
  Filled 2015-12-25: qty 5

## 2015-12-25 MED ORDER — HYDRALAZINE HCL 20 MG/ML IJ SOLN: 10.0000 mg | INTRAMUSCULAR | Status: DC | PRN

## 2015-12-25 MED ORDER — FENTANYL CITRATE (PF) 100 MCG/2ML IJ SOLN
INTRAMUSCULAR | Status: AC
  Filled 2015-12-25: qty 2

## 2015-12-25 SURGICAL SUPPLY — 47 items
BINDER ABD UNIV 12 45-62 (WOUND CARE) ×1 IMPLANT
BINDER ABDOMINAL 46IN 62IN (WOUND CARE) ×2
BLADE 11 SAFETY STRL DISP (BLADE) ×2 IMPLANT
BLADE CLIPPER SURG (BLADE) ×2 IMPLANT
BLADE EXTENDED COATED 6.5IN (ELECTRODE) IMPLANT
CHLORAPREP W/TINT 26ML (MISCELLANEOUS) ×2 IMPLANT
COVER SURGICAL LIGHT HANDLE (MISCELLANEOUS) ×2 IMPLANT
DEVICE PMI PUNCTURE CLOSURE (MISCELLANEOUS) ×2 IMPLANT
DRAPE LAPAROSCOPIC ABDOMINAL (DRAPES) ×2 IMPLANT
DRAPE WARM FLUID 44X44 (DRAPE) ×2 IMPLANT
DRSG PAD ABDOMINAL 8X10 ST (GAUZE/BANDAGES/DRESSINGS) ×2 IMPLANT
ELECT REM PT RETURN 9FT ADLT (ELECTROSURGICAL) ×2
ELECTRODE REM PT RTRN 9FT ADLT (ELECTROSURGICAL) ×1 IMPLANT
GAUZE SPONGE 4X4 12PLY STRL (GAUZE/BANDAGES/DRESSINGS) ×2 IMPLANT
GLOVE BIO SURGEON STRL SZ7.5 (GLOVE) ×2 IMPLANT
GLOVE BIOGEL PI IND STRL 7.0 (GLOVE) ×1 IMPLANT
GLOVE BIOGEL PI INDICATOR 7.0 (GLOVE) ×1
GOWN STRL REUS W/TWL LRG LVL3 (GOWN DISPOSABLE) ×6 IMPLANT
GOWN STRL REUS W/TWL XL LVL3 (GOWN DISPOSABLE) ×4 IMPLANT
HANDLE SUCTION POOLE (INSTRUMENTS) ×2 IMPLANT
KIT BASIN OR (CUSTOM PROCEDURE TRAY) ×2 IMPLANT
KIT ROOM TURNOVER OR (KITS) ×2 IMPLANT
LIGASURE IMPACT 36 18CM CVD LR (INSTRUMENTS) IMPLANT
MESH ULTRAPRO 12X12 30X30CM (Mesh General) ×2 IMPLANT
NS IRRIG 1000ML POUR BTL (IV SOLUTION) ×4 IMPLANT
PACK GENERAL/GYN (CUSTOM PROCEDURE TRAY) ×2 IMPLANT
PAD ARMBOARD 7.5X6 YLW CONV (MISCELLANEOUS) IMPLANT
RETAINER VISCERA MED (MISCELLANEOUS) ×2 IMPLANT
SPECIMEN JAR LARGE (MISCELLANEOUS) IMPLANT
SPONGE LAP 18X18 X RAY DECT (DISPOSABLE) ×4 IMPLANT
STAPLER VISISTAT 35W (STAPLE) ×2 IMPLANT
STRIP CLOSURE SKIN 1/2X4 (GAUZE/BANDAGES/DRESSINGS) ×2 IMPLANT
SUCTION POOLE HANDLE (INSTRUMENTS) ×4
SUT NOVA NAB GS-21 0 18 T12 DT (SUTURE) ×4 IMPLANT
SUT PDS AB 1 CTX 36 (SUTURE) ×4 IMPLANT
SUT PDS AB 1 TP1 96 (SUTURE) IMPLANT
SUT PDS AB 2-0 CT2 27 (SUTURE) ×4 IMPLANT
SUT SILK 2 0 SH CR/8 (SUTURE) ×2 IMPLANT
SUT SILK 2 0 TIES 10X30 (SUTURE) IMPLANT
SUT SILK 3 0 SH CR/8 (SUTURE) ×4 IMPLANT
SUT SILK 3 0 TIES 10X30 (SUTURE) IMPLANT
SUT VIC AB 3-0 SH 18 (SUTURE) ×6 IMPLANT
TOWEL OR 17X24 6PK STRL BLUE (TOWEL DISPOSABLE) IMPLANT
TOWEL OR 17X26 10 PK STRL BLUE (TOWEL DISPOSABLE) ×2 IMPLANT
TRAY FOLEY W/METER SILVER 14FR (SET/KITS/TRAYS/PACK) ×2 IMPLANT
TRAY FOLEY W/METER SILVER 16FR (SET/KITS/TRAYS/PACK) IMPLANT
YANKAUER SUCT BULB TIP NO VENT (SUCTIONS) IMPLANT

## 2015-12-25 NOTE — H&P (Signed)
History of Present Illness Ralene Ok MD; 10/14/2015 2:01 PM) Patient words: hernia.  The patient is a 70 year old female who presents with an incisional hernia. Patient is a 70 year old female who is referred by Dr. Stephannie Peters for evaluation of an incisional hernia. Patient has undergone multiple GYN operations. Most recently in 2014 she underwent exploratory laparoscopy, abdominal hysterectomy, bilateral salpingo-oophorectomy and tumor removal. Patient states that soon thereafter she noticed a bulge upper portion of her abdomen. Patient has had some discomfort secondary to this weakness of her abdominal wall and pressure hernia. Patient recently November 2016 underwent a CT scan which revealed approximately a 8.3 cm incisional hernia.  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  Patient does do some yoga, gardening, walking. She states she's lost approximately 20 pounds in the last 2 years. She continues to want to lose weight.   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 Famotidine (20MG  Tablet, Oral) Active. FLUoxetine HCl (10MG  Capsule, Oral) Active. Rosuvastatin Calcium (20MG  Tablet, Oral) Active. Triamterene-HCTZ (37.5-25MG  Tablet, Oral) Active. Triamcinolone Acetonide (0.5% Cream, External) Active. Cholestyramine (4GM/DOSE Powder, Oral) Active. Oxycodone-Acetaminophen (5-325MG   Tablet, Oral as needed) Active. Eszopiclone (2MG  Tablet, Oral) Active. FLUoxetine HCl (20MG  Capsule, Oral) Active. Tamsulosin HCl (0.4MG  Capsule, Oral) Active. Triamcinolone Acetonide (0.025% Cream, External as needed) Active. Medications Reconciled  Family History Marjean Donna, CMA; 10/14/2015 1:30 PM) Cancer Mother. Respiratory Condition Mother.  Pregnancy / Birth History Marjean Donna, [place]; 10/14/2015 1:30 PM) Age at menarche 71 years. Age of menopause 8-50 Contraceptive History Contraceptive implant. Gravida 2 Para 2    Review of Systems (Sonya Bynum CMA; 10/14/2015 1:30 PM) General Present- Chills and Fatigue. Not Present- Appetite Loss, Fever, Night Sweats, Weight Gain and Weight Loss. HEENT Present- Earache, Hoarseness and Oral Ulcers. Not Present- Hearing Loss, Nose Bleed, Ringing in the Ears, Seasonal Allergies, Sinus Pain, Sore Throat, Visual Disturbances, Wears glasses/contact lenses and Yellow Eyes. Respiratory Present- Chronic Cough. Not Present- Bloody sputum, Difficulty Breathing, Snoring and Wheezing. Gastrointestinal Present- Bloating and Chronic diarrhea. Not Present- Abdominal Pain, Bloody Stool, Change in Bowel Habits, Constipation, Difficulty Swallowing, Excessive gas, Gets full quickly at meals, Hemorrhoids, Indigestion, Nausea, Rectal Pain and Vomiting. Musculoskeletal Present- Back Pain. Not Present- Joint Pain, Joint Stiffness, Muscle Pain, Muscle Weakness and Swelling of Extremities. BP 136/92 mmHg  Pulse 70  Temp(Src) 98.2 F (36.8 C) (Oral)  Resp 18  Ht 5' (1.524 m)  Wt 80.003 kg (176 lb 6 oz)  BMI 34.45 kg/m2  SpO2 98%   Physical Exam Ralene Ok MD; 10/14/2015 2:01 PM) General Mental Status-Alert. General Appearance-Consistent with stated age. Hydration-Well hydrated. Voice-Normal.  Chest and Lung Exam Chest and lung exam reveals -quiet, even and easy respiratory effort with no use of accessory muscles and on auscultation,  normal breath sounds, no adventitious sounds and normal vocal resonance. Inspection Chest Wall - Normal. Back - normal.  Cardiovascular Cardiovascular examination reveals -normal heart sounds, regular rate and rhythm with no murmurs and normal  pedal pulses bilaterally.  Abdomen Note: Midline incision, upper abdominal hernia, reducible     Assessment & Plan Ralene Ok MD; 10/14/2015 2:02 PM) INCISIONAL HERNIA, WITHOUT OBSTRUCTION OR GANGRENE (K43.2) Impression: Patient is a 70 year old female with an incisional upper midline hernia.  1. I had a long discussion with the patient in regards to open repair. Patient would require exploratory laparotomy, lysis of adhesions, TAR hernia repair with mesh. The patient will like to proceed at this time. 2. I discussed with her the risks and benefits of procedure to include but not limited to: Infection, bleeding, structures, possible recurrence, possible need for further surgery. The patient was understanding and wished to proceed.

## 2015-12-25 NOTE — Progress Notes (Addendum)
Kristin Webb is a 70 y.o. female patient admitted from OR awake, alert - oriented  X 4 - no acute distress noted.  VSS - Blood pressure 121/71 , pulse 78, temperature 98.7 F (37.1 C), temperature source Oral, resp. rate 12, height 5' (1.524 m), weight 80.003 kg (176 lb 6 oz), SpO2 95 % w/ 2L O2.    IV in place, occlusive dsg intact without redness.  Orientation to room, and floor completed with information packet given to patient/family.  Patient declined safety video at this time.  Admission INP armband ID verified with patient/family, and in place.   SR up x 2, fall assessment complete, with patient and family able to verbalize understanding of risk associated with falls, and verbalized understanding to call nsg before up out of bed.  Call light within reach, patient able to voice, and demonstrate understanding.  Skin, clean-dry- intact without evidence of bruising, or skin tears.   No evidence of skin break down noted on exam.     Will cont to eval and treat per MD orders.  Marcy Salvo, RN 12/25/2015 5:01 PM

## 2015-12-25 NOTE — Anesthesia Procedure Notes (Signed)
Procedure Name: Intubation Date/Time: 12/25/2015 8:34 AM Performed by: Maxwell Caul Pre-anesthesia Checklist: Patient identified, Emergency Drugs available, Suction available and Patient being monitored Patient Re-evaluated:Patient Re-evaluated prior to inductionOxygen Delivery Method: Circle System Utilized Preoxygenation: Pre-oxygenation with 100% oxygen Intubation Type: IV induction Ventilation: Mask ventilation without difficulty Laryngoscope Size: Mac and 4 Grade View: Grade II Tube type: Oral Tube size: 7.0 mm Number of attempts: 1 Airway Equipment and Method: Stylet Placement Confirmation: ETT inserted through vocal cords under direct vision,  positive ETCO2 and breath sounds checked- equal and bilateral Secured at: 21 cm Tube secured with: Tape Dental Injury: Teeth and Oropharynx as per pre-operative assessment

## 2015-12-25 NOTE — Op Note (Signed)
12/25/2015  11:06 AM  PATIENT:  Kristin Webb  70 y.o. female  PRE-OPERATIVE DIAGNOSIS:  incisional hernia  POST-OPERATIVE DIAGNOSIS:  Incarcerated incisional hernia  PROCEDURE:  Procedure(s): EXPLORATORY LAPAROTOMY (N/A) LYSIS OF ADHESION (N/A) INCISIONAL HERNIA REPAIR WITH BILATERAL MUSCULOCUTANEOUS ADVANCEMENT FLAP  (N/A) INSERTION OF MESH (N/A)  SURGEON:  Surgeon(s) and Role:    * Axel Filler, MD - Primary    * Claud Kelp, MD - Assisting  ANESTHESIA:   local and general  EBL:  Total I/O In: 1000 [I.V.:1000] Out: 200 [Urine:150; Blood:50]  BLOOD ADMINISTERED:none  DRAINS: none   LOCAL MEDICATIONS USED:  NONE  SPECIMEN:  No Specimen  DISPOSITION OF SPECIMEN:  N/A  COUNTS:  YES  TOURNIQUET:  * No tourniquets in log *  DICTATION: .Dragon Dictation After the patient was consented he was taken back to the operating room placed supine position bilateral SCDs in place. She underwent endotracheal intubation.  A Foley catheter was placed. After appropriate antibiotics were confirmed timeout was called and all facts verified.  A #10 blade was used to make a midline incision over the previous incision site.Electrocauter was used to maintain hemostasis and dissection was taken down to the anterior fascia midline. This was incised sharply. The fascia was elevated up. The hernia sac was then sharply cut. At this time incision was extended to the length of the skin incision. There was transverse colonic and small bowel adhesions to the peritoneum both superiorly and inferiorly. This retaken out sharply. Inferior small bowel did have serosal tear 2 3-0 silk's were used in a Lembert fashion to reapproximate the serosa. The colon was unharmed and taken down from the peritoneum.  Once this was done the rectus sheath retracted medially. The inferior portion of the right tissues were then incised with electrocautery. The muscle was adherent to the posterior rectus sheath, However I  was able to dissect off the posterior rectus sheath in its entirety. This was done superiorly to the xiphoid and inferiorly past the hernia sac, and passed into are currently. At this time 1 approximately  to the perforating vessels the transversus abdominis muscle was incised as was its fascia. We carried this dissection both inferiorly and superiorly. At this time the transversus abdominis muscle was then dissected laterally in a blunt fashion. A right angle was used to initially started the dissection with electrocautery. This easily dissected laterally. This allowed advancement of the peritoneum medially. The peritoneal holes that were made were then reapproximated using a 2-0 Vicryl in a figure-of-eight fashion.   At this time proceeded to retract the left rectus sheath medially and the inferior portion of the rectus sheath was incised. There were minimal muscular adhesions in this area. This easily dissected away. Again approximately 1 cm medial to the perforating vessels to the transversus abdominis muscle was incised as was its fascia. Again we proceeded to dissect with a right angle both superiorly and inferiorly to release the transversus abdominis muscle. At this time proceeded to bluntly dissect away the transverse abdominis muscle from the underlying peritoneum laterally. 2 holes of the peritoneum reapproximated using a 3-0 Vicryl figure-of-eight fashion.  The superior and inferior portions of the dissection were connected in this plane.  both the pubic tubercle as well as the xiphoid were palpated easily. This allowed [place] to bluntly dissect the preperitoneal space superior and inferiorly.   At this time, Coker clamps were used to reapproximate the posterior rectus sheath and midline fascia. This was easily approximated in the midline.  At this time 2-0 PDS suture was used in a running standard fashion reapproximate the peritoneum in the midline.  There was no tension on the midline peritoneal  closure.  At this time a piece of 30 x 30 cm UltraPro mesh was then placed in the preperitoneal space. 0 Novafil sutures were used to tack the mesh.  fascial bites using Endo Close device 3 laterally. This was also done superior and inferior portion of the hernia 2 respectively. The mesh lay flat and the tension wasequally on the lateral laterally on each side. At this time as it was used to approximate the mesh to the peritoneum.  At this time the rectus fascia was reapproximated midline using #1 PDS in a standard running fashion. Again there was no tension on the fascia in the midline.  The skin was stapled closed.  The bilateral stab wounds were then dressed with Steri-Strips the midline skin was dressed with Steri-Strips gauze and tape.   Patient was awakened from general anesthesia and taken to recovery room stable condition.    PLAN OF CARE: Admit to inpatient   PATIENT DISPOSITION:  PACU - hemodynamically stable.   Delay start of Pharmacological VTE agent (>24hrs) due to surgical blood loss or risk of bleeding: yes

## 2015-12-25 NOTE — Anesthesia Postprocedure Evaluation (Signed)
Anesthesia Post Note  Patient: Kristin Webb  Procedure(s) Performed: Procedure(s) (LRB): EXPLORATORY LAPAROTOMY (N/A) LYSIS OF ADHESION (N/A)  INCISIONAL HERNIA REPAIR (N/A) INSERTION OF MESH (N/A)  Patient location during evaluation: PACU Anesthesia Type: General Level of consciousness: awake and alert Pain management: pain level controlled Vital Signs Assessment: post-procedure vital signs reviewed and stable Respiratory status: spontaneous breathing, nonlabored ventilation, respiratory function stable and patient connected to nasal cannula oxygen Cardiovascular status: blood pressure returned to baseline and stable Postop Assessment: no signs of nausea or vomiting Anesthetic complications: no    Last Vitals:  Filed Vitals:   12/25/15 1229 12/25/15 1230  BP:  147/86  Pulse: 90 93  Temp:  36.8 C  Resp: 10 12    Last Pain:  Filed Vitals:   12/25/15 1232  PainSc: 6                  Keymani Glynn,W. EDMOND

## 2015-12-25 NOTE — Transfer of Care (Signed)
Immediate Anesthesia Transfer of Care Note  Patient: Kristin Webb  Procedure(s) Performed: Procedure(s): EXPLORATORY LAPAROTOMY (N/A) LYSIS OF ADHESION (N/A)  INCISIONAL HERNIA REPAIR (N/A) INSERTION OF MESH (N/A)  Patient Location: PACU  Anesthesia Type:General  Level of Consciousness:  sedated, patient cooperative and responds to stimulation  Airway & Oxygen Therapy:Patient Spontanous Breathing and Patient connected to face mask oxgen  Post-op Assessment:  Report given to PACU RN and Post -op Vital signs reviewed and stable  Post vital signs:  Reviewed and stable  Last Vitals:  Filed Vitals:   12/25/15 0623  BP: 136/92  Pulse: 70  Temp: 36.8 C  Resp: 18    Complications: No apparent anesthesia complications

## 2015-12-26 LAB — CBC
HCT: 38.6 % (ref 36.0–46.0)
Hemoglobin: 12.4 g/dL (ref 12.0–15.0)
MCH: 29.1 pg (ref 26.0–34.0)
MCHC: 32.1 g/dL (ref 30.0–36.0)
MCV: 90.6 fL (ref 78.0–100.0)
Platelets: 169 10*3/uL (ref 150–400)
RBC: 4.26 MIL/uL (ref 3.87–5.11)
RDW: 14.2 % (ref 11.5–15.5)
WBC: 8.3 10*3/uL (ref 4.0–10.5)

## 2015-12-26 MED ORDER — DIPHENHYDRAMINE HCL 50 MG PO CAPS
50.0000 mg | ORAL_CAPSULE | Freq: Every day | ORAL | Status: DC
  Administered 2015-12-26 – 2015-12-28 (×3): 50 mg via ORAL
  Filled 2015-12-26: qty 2
  Filled 2015-12-26 (×4): qty 1

## 2015-12-26 MED ORDER — FLUOXETINE HCL 20 MG PO CAPS
30.0000 mg | ORAL_CAPSULE | Freq: Every day | ORAL | Status: DC
  Administered 2015-12-26 – 2015-12-29 (×4): 30 mg via ORAL
  Filled 2015-12-26 (×4): qty 1

## 2015-12-26 MED ORDER — ESZOPICLONE 2 MG PO TABS: 2.0000 mg | ORAL_TABLET | Freq: Every evening | ORAL | Status: DC | PRN

## 2015-12-26 MED ORDER — ACETAMINOPHEN 10 MG/ML IV SOLN
1000.0000 mg | Freq: Four times a day (QID) | INTRAVENOUS | Status: AC
  Administered 2015-12-26 (×2): 1000 mg via INTRAVENOUS
  Filled 2015-12-26 (×3): qty 100

## 2015-12-26 MED ORDER — ESZOPICLONE 2 MG PO TABS
2.0000 mg | ORAL_TABLET | Freq: Every evening | ORAL | Status: DC | PRN
  Administered 2015-12-27 – 2015-12-28 (×2): 2 mg via ORAL

## 2015-12-26 NOTE — Progress Notes (Signed)
Called MD Rosendo Gros in regard to patient's wishing to restart her home medications, awaiting callback Neta Mends RN 11:59 AM  12-26-2015

## 2015-12-26 NOTE — Progress Notes (Signed)
Orders given by MD Rosendo Gros for patient to resume lunesta at bedtime and prozac, orders entered Neta Mends RN 12:31 PM 12-26-2015

## 2015-12-26 NOTE — Progress Notes (Signed)
1 Day Post-Op  Subjective: Pt doing well this AM.  She has been ambulating POD0 and is con't to mobilize. Some pain with getting up.  Objective: Vital signs in last 24 hours: Temp:  [98.3 F (36.8 C)-99.3 F (37.4 C)] 99.1 F (37.3 C) (05/18 0649) Pulse Rate:  [74-99] 74 (05/18 0649) Resp:  [10-18] 16 (05/18 0649) BP: (121-160)/(71-95) 133/73 mmHg (05/18 0649) SpO2:  [93 %-99 %] 99 % (05/18 0649) Last BM Date: 12/24/15  Intake/Output from previous day: 05/17 0701 - 05/18 0700 In: 4688.8 [P.O.:1080; I.V.:3538.8; IV Piggyback:70] Out: 2150 [Urine:2100; Blood:50] Intake/Output this shift:    General appearance: alert and cooperative Cardio: regular rate and rhythm, S1, S2 normal, no murmur, click, rub or gallop GI: soft, non-tender; bowel sounds normal; no masses,  no organomegaly  Lab Results:   Recent Labs  12/25/15 1225 12/26/15 0430  WBC 10.2 8.3  HGB 12.9 12.4  HCT 38.3 38.6  PLT 135* 169   BMET  Recent Labs  12/23/15 1000 12/25/15 1225  NA 141  --   K 5.1  --   CL 105  --   CO2 28  --   GLUCOSE 103*  --   BUN 17  --   CREATININE 0.60 0.72  CALCIUM 10.0  --    Anti-infectives: Anti-infectives    Start     Dose/Rate Route Frequency Ordered Stop   12/25/15 1700  ceFAZolin (ANCEF) IVPB 2g/100 mL premix     2 g 200 mL/hr over 30 Minutes Intravenous Every 8 hours 12/25/15 1127 12/26/15 0636   12/25/15 0622  ceFAZolin (ANCEF) 2 g in dextrose 5 % 50 mL IVPB     2 g 140 mL/hr over 30 Minutes Intravenous On call to O.R. 12/25/15 0622 12/25/15 0840      Assessment/Plan: s/p Procedure(s): EXPLORATORY LAPAROTOMY (N/A) LYSIS OF ADHESION (N/A)  INCISIONAL HERNIA REPAIR (N/A) INSERTION OF MESH (N/A) Advance diet to FLD con't to mobilize DC foley Add IV tylenol for pain control  LOS: 1 day    Rosario Jacks., Anne Hahn 12/26/2015

## 2015-12-27 MED ORDER — TRAMADOL HCL 50 MG PO TABS
50.0000 mg | ORAL_TABLET | Freq: Four times a day (QID) | ORAL | Status: DC | PRN
  Administered 2015-12-27: 50 mg via ORAL
  Filled 2015-12-27: qty 1

## 2015-12-27 MED ORDER — OXYCODONE HCL 5 MG PO TABS: 5.0000 mg | ORAL_TABLET | ORAL | Status: DC | PRN

## 2015-12-27 NOTE — Progress Notes (Signed)
2 Days Post-Op  Subjective: Pt doing well. ambualting well.  No bowel function yet but tol PO.  Soreness better  Objective: Vital signs in last 24 hours: Temp:  [98.2 F (36.8 C)-99 F (37.2 C)] 98.5 F (36.9 C) (05/19 0605) Pulse Rate:  [65-88] 88 (05/19 1151) Resp:  [16-18] 17 (05/19 0605) BP: (113-154)/(62-91) 154/91 mmHg (05/19 1151) SpO2:  [92 %-97 %] 92 % (05/19 0605) Last BM Date: 12/24/15  Intake/Output from previous day: 05/18 0701 - 05/19 0700 In: 1845 [P.O.:720; I.V.:1125] Out: 950 [Urine:950] Intake/Output this shift: Total I/O In: 360 [P.O.:240; I.V.:120] Out: 1 [Urine:1]  General appearance: alert and cooperative GI: soft, non-tender; bowel sounds normal; no masses,  no organomegaly  Lab Results:   Recent Labs  12/25/15 1225 12/26/15 0430  WBC 10.2 8.3  HGB 12.9 12.4  HCT 38.3 38.6  PLT 135* 169   BMET  Recent Labs  12/25/15 1225  CREATININE 0.72   Anti-infectives: Anti-infectives    Start     Dose/Rate Route Frequency Ordered Stop   12/25/15 1700  ceFAZolin (ANCEF) IVPB 2g/100 mL premix     2 g 200 mL/hr over 30 Minutes Intravenous Every 8 hours 12/25/15 1127 12/26/15 0636   12/25/15 0622  ceFAZolin (ANCEF) 2 g in dextrose 5 % 50 mL IVPB     2 g 140 mL/hr over 30 Minutes Intravenous On call to O.R. 12/25/15 0622 12/25/15 0840      Assessment/Plan: s/p Procedure(s): EXPLORATORY LAPAROTOMY (N/A) LYSIS OF ADHESION (N/A)  INCISIONAL HERNIA REPAIR (N/A) INSERTION OF MESH (N/A) Advance diet to Reg Hopefully home in AM if having bowel function  LOS: 2 days    Kristin Webb., [facility] 12/27/2015

## 2015-12-27 NOTE — Progress Notes (Signed)
NURSING PROGRESS NOTE  Kristin Webb [id number] Transfer Data: 12/27/2015 3:44 PM Attending Provider: Ralene Ok, MD TO:[id number], MD Code Status: Full  Report called to 3W nurse.  Kristin Webb is a 70 y.o. female patient transferred from [facility]  -No acute distress noted.  -No complaints of shortness of breath.  -No complaints of chest pain.    Blood pressure 154/91, pulse 88, temperature 98.5 F (36.9 C), temperature source Oral, resp. rate 17, height 5' (1.524 m), weight 80.003 kg (176 lb 6 oz), SpO2 92 %.   IV Fluids: N/A  Allergies:  Bee venom; Ambien; and Cortisone  Past Medical History:   has a past medical history of Pelvic mass in female; Constipation; Diarrhea; Hyperlipidemia; Burning mouth syndrome; Chronic fatigue; Fibromyalgia; Lumbar disc disease; PONV (postoperative nausea and vomiting); Hypertension; Shortness of breath; GERD (gastroesophageal reflux disease); Yeast infection; Ovarian cancer ([place]) (2014); Sleep apnea; Pneumonia; and History of kidney stones (07/2015).  Past Surgical History:   has past surgical history that includes Cholecystectomy; Abdominal hysterectomy; Trigger finger release; laparotomy (Bilateral, 11/08/2012); Appendectomy; Exploratory laparotomy (11/08/12); Cystoscopy w/ ureteral stent placement (Left, 07/09/2015); laparotomy (N/A, 12/25/2015); Lysis of adhesion (N/A, 12/25/2015); Incisional hernia repair (N/A, 12/25/2015); and Insertion of mesh (N/A, 12/25/2015).  Social History:   reports that she has never smoked. She has never used smokeless tobacco. She reports that she does not drink alcohol or use illicit drugs.  Skin: Edematous left arm and hand progressively decreasing. Pink foam on bottom.  Patient/Family orientated to room. Information packet given to patient/family. Admission inpatient armband information verified with patient/family to include name and date of birth and placed on patient arm. Side rails up x 2, fall assessment and  education completed with patient/family. Patient/family able to verbalize understanding of risk associated with falls and verbalized understanding to call for assistance before getting out of bed. Call light within reach. Patient/family able to voice and demonstrate understanding of unit orientation instructions.    Will continue to evaluate and treat per MD orders.

## 2015-12-27 NOTE — Discharge Instructions (Signed)
[facility] _______[facility] ° °HERNIA REPAIR: POST OP INSTRUCTIONS ° °Always review your discharge instruction sheet given to you by the facility where your surgery was performed. °IF YOU HAVE DISABILITY OR FAMILY LEAVE FORMS, YOU MUST BRING THEM TO THE OFFICE FOR PROCESSING.   °DO NOT GIVE THEM TO YOUR DOCTOR. ° °1. A  prescription for pain medication may be given to you upon discharge.  Take your pain medication as prescribed, if needed.  If narcotic pain medicine is not needed, then you may take acetaminophen (Tylenol) or ibuprofen (Advil) as needed. °2. Take your usually prescribed medications unless otherwise directed. °3. If you need a refill on your pain medication, please contact your pharmacy.  They will contact our office to request authorization. Prescriptions will not be filled after 5 pm or on week-ends. °4. You should follow a light diet the first 24 hours after arrival home, such as soup and crackers, etc.  Be sure to include lots of fluids daily.  Resume your normal diet the day after surgery. °5. Most patients will experience some swelling and bruising around the umbilicus or in the groin and scrotum.  Ice packs and reclining will help.  Swelling and bruising can take several days to resolve.  °6. It is common to experience some constipation if taking pain medication after surgery.  Increasing fluid intake and taking a stool softener (such as Colace) will usually help or prevent this problem from occurring.  A mild laxative (Milk of Magnesia or Miralax) should be taken according to package directions if there are no bowel movements after 48 hours. °7. Unless discharge instructions indicate otherwise, you may remove your bandages 24-48 hours after surgery, and you may shower at that time.  You may have steri-strips (small skin tapes) in place directly over the incision.  These strips should be left on the skin for 7-10 days.  If your surgeon used skin glue on the incision, you may shower  in 24 hours.  The glue will flake off over the next 2-3 weeks.  Any sutures or staples will be removed at the office during your follow-up visit. °8. ACTIVITIES:  You may resume regular (light) daily activities beginning the next day--such as daily self-care, walking, climbing stairs--gradually increasing activities as tolerated.  You may have sexual intercourse when it is comfortable.  Refrain from any heavy lifting or straining until approved by your doctor. °a. You may drive when you are no longer taking prescription pain medication, you can comfortably wear a seatbelt, and you can safely maneuver your car and apply brakes. °b. RETURN TO WORK:  __________________________________________________________ °9. You should see your doctor in the office for a follow-up appointment approximately 2-3 weeks after your surgery.  Make sure that you call for this appointment within a day or two after you arrive home to insure a convenient appointment time. °10. OTHER INSTRUCTIONS:  __________________________________________________________________________________________________________________________________________________________________________________________  °WHEN TO CALL YOUR DOCTOR: °1. Fever over 101.0 °2. Inability to urinate °3. Nausea and/or vomiting °4. Extreme swelling or bruising °5. Continued bleeding from incision. °6. Increased pain, redness, or drainage from the incision ° °The clinic staff is available to answer your questions during regular business hours.  Please don’t hesitate to call and ask to speak to one of the nurses for clinical concerns.  If you have a medical emergency, go to the nearest emergency room or call 911.  A surgeon from [facility] is always on call at the hospital ° ° °[address], [address], [place], [place]  [address] ? ° [address] [address], [place], [place]   [address] °[phone] ? [phone] ? FAX [phone] °Web site: [email or web address] ° °

## 2015-12-28 NOTE — Progress Notes (Signed)
Post op dressing changed, with small amount of dried blood in old dressing, staple line intact and dry. Patient is scratching and abdominal binder released for few minutes.binder cutting her skin , cream applied  On skin under the edges of the binder,

## 2015-12-28 NOTE — Progress Notes (Signed)
3 Days Post-Op  Subjective: Pt doing well. ambualting well.  Having some flatus.  Pain worse today.  Feels more distended.  No nausea  Objective: Vital signs in last 24 hours: Temp:  [97.9 F (36.6 Webb)-98.1 F (36.7 Webb)] 97.9 F (36.6 Webb) (05/20 0455) Pulse Rate:  [80-88] 82 (05/20 0455) Resp:  [16-19] 16 (05/20 0455) BP: (138-162)/(77-97) 138/97 mmHg (05/20 0455) SpO2:  [93 %-99 %] 93 % (05/20 0455) Last BM Date: 12/24/15  Intake/Output from previous day: 05/19 0701 - 05/20 0700 In: 715 [P.O.:595; I.V.:120] Out: -  Intake/Output this shift:    General appearance: alert and cooperative GI: soft, non-tender; distended  Lab Results:   Recent Labs  12/25/15 1225 12/26/15 0430  WBC 10.2 8.3  HGB 12.9 12.4  HCT 38.3 38.6  PLT 135* 169   BMET  Recent Labs  12/25/15 1225  CREATININE 0.72   Anti-infectives: Anti-infectives    Start     Dose/Rate Route Frequency Ordered Stop   12/25/15 1700  ceFAZolin (ANCEF) IVPB 2g/100 mL premix     2 g 200 mL/hr over 30 Minutes Intravenous Every 8 hours 12/25/15 1127 12/26/15 0636   12/25/15 0622  ceFAZolin (ANCEF) 2 g in dextrose 5 % 50 mL IVPB     2 g 140 mL/hr over 30 Minutes Intravenous On call to O.R. 12/25/15 0622 12/25/15 0840      Assessment/Plan: s/p Procedure(s): EXPLORATORY LAPAROTOMY (N/A) LYSIS OF ADHESION (N/A)  INCISIONAL HERNIA REPAIR (N/A) INSERTION OF MESH (N/A) Cont Reg diet and current regimen.  Pt's pain not adequately controlled with PO meds Hopefully home in AM if having more bowel function and pain is better  LOS: 3 days    Kristin Webb. [id number]

## 2015-12-29 MED ORDER — ONDANSETRON 4 MG PO TBDP: 4.0000 mg | ORAL_TABLET | Freq: Four times a day (QID) | ORAL | Status: DC | PRN

## 2015-12-29 NOTE — Progress Notes (Signed)
Patient discharged to home with friend, discharge instructions reviewed with patient who verbalized understanding. New RX given to patient. 

## 2015-12-29 NOTE — Discharge Summary (Signed)
Physician Discharge Summary  Patient ID: Meleny Rochester MRN: [id number] DOB/AGE: April 21, 1946 70 y.o.  Admit date: 12/25/2015 Discharge date: 12/29/2015  Admission Diagnoses: ventral hernia  Discharge Diagnoses:  Active Problems:   S/P hernia repair   Discharged Condition: good  Hospital Course: Patient admitted after hernia repair.  Her diet was advanced as tolerated.  She was discharged to home once her pain and nausea were controlled.  She was having some bowel function (flatus) as well at discharge.  Consults: None  Significant Diagnostic Studies: labs: cbc, chemistry  Treatments: IV hydration, analgesia: oxycodone and surgery: hernia repair  Discharge Exam: Blood pressure 146/82, pulse 88, temperature 98.4 F (36.9 C), temperature source Oral, resp. rate 16, height 5' (1.524 m), weight 85.73 kg (189 lb), SpO2 98 %. General appearance: alert and cooperative GI: soft, midly distended, appropriately tender Incision/Wound: clean, dry, intact  Disposition: 01-Home or Self Care     Medication List    TAKE these medications        CALCIUM 600+D3 600-800 MG-UNIT Tabs  Generic drug:  Calcium Carb-Cholecalciferol  Take 1 tablet by mouth daily.     Cholecalciferol 1000 units tablet  Take 2,000 Units by mouth daily.     colestipol 1 g tablet  Commonly known as:  COLESTID  Take 1 g by mouth daily as needed (For diarrhea.).     diphenhydrAMINE 25 mg capsule  Commonly known as:  BENADRYL  Take 50 mg by mouth at bedtime.     doxycycline 100 MG tablet  Commonly known as:  VIBRA-TABS  Take 100 mg by mouth 2 (two) times daily.     EPIPEN 2-PAK 0.3 mg/0.3 mL Devi  Generic drug:  EPINEPHrine  Inject 0.3 mg into the muscle once as needed (For bee stings.).     eszopiclone 2 MG Tabs tablet  Commonly known as:  LUNESTA  Take 1 tablet (2 mg total) by mouth at bedtime as needed for sleep. Take immediately before bedtime     FLUoxetine 20 MG capsule  Commonly known as:  PROZAC   Take 20 mg by mouth daily. Takes with a 10mg  capsule for her total dose of 30mg .     FLUoxetine 10 MG capsule  Commonly known as:  PROZAC  Take 10 mg by mouth daily. Takes with a 20mg  capsule for her total dose of 30mg .     ibuprofen 200 MG tablet  Commonly known as:  ADVIL,MOTRIN  Take 200 mg by mouth every 6 (six) hours as needed for pain.     Melatonin 10 MG Tabs  Take 10 mg by mouth at bedtime as needed (For sleep.).     multivitamin with minerals Tabs tablet  Take 1 tablet by mouth daily.     oxyCODONE 5 MG immediate release tablet  Commonly known as:  Oxy IR/ROXICODONE  Take 1-2 tablets (5-10 mg total) by mouth every 4 (four) hours as needed for moderate pain.     rosuvastatin 10 MG tablet  Commonly known as:  CRESTOR  Take 10 mg by mouth at bedtime.     triamcinolone cream 0.1 %  Commonly known as:  KENALOG  Apply 1 application topically 2 (two) times daily.     triamterene-hydrochlorothiazide 37.5-25 MG tablet  Commonly known as:  MAXZIDE-25  Take 1 tablet by mouth daily.           Follow-up Information    Follow up with Reyes Ivan, MD. Schedule an appointment as soon as possible for a  visit in 2 weeks.   Specialty:  General Surgery   Why:  For wound re-check   Contact information:   [address] [place] [address] [phone]       Follow up with [facility] In 6 days.   Specialty:  General Surgery   Why:  schedule a nurse visit for staple removal for later this week   Contact information:   [address] [place] [phone]      Signed: Rosario Adie [id number], [id number] AM

## 2016-01-01 DIAGNOSIS — R110 Nausea: Secondary | ICD-10-CM | POA: Diagnosis not present

## 2016-01-08 ENCOUNTER — Ambulatory Visit: Payer: Commercial Managed Care - HMO | Admitting: Neurology

## 2016-01-09 ENCOUNTER — Other Ambulatory Visit: Payer: Self-pay | Admitting: Oncology

## 2016-01-09 DIAGNOSIS — C5701 Malignant neoplasm of right fallopian tube: Secondary | ICD-10-CM

## 2016-01-20 ENCOUNTER — Telehealth: Payer: Self-pay | Admitting: Oncology

## 2016-01-20 ENCOUNTER — Encounter: Payer: Self-pay | Admitting: Oncology

## 2016-01-20 ENCOUNTER — Ambulatory Visit (HOSPITAL_BASED_OUTPATIENT_CLINIC_OR_DEPARTMENT_OTHER): Payer: Commercial Managed Care - HMO

## 2016-01-20 ENCOUNTER — Other Ambulatory Visit (HOSPITAL_BASED_OUTPATIENT_CLINIC_OR_DEPARTMENT_OTHER): Payer: Commercial Managed Care - HMO

## 2016-01-20 ENCOUNTER — Ambulatory Visit (HOSPITAL_BASED_OUTPATIENT_CLINIC_OR_DEPARTMENT_OTHER): Payer: Commercial Managed Care - HMO | Admitting: Oncology

## 2016-01-20 VITALS — BP 134/83 | HR 83 | Temp 97.9°F | Resp 18 | Ht 60.0 in | Wt 174.9 lb

## 2016-01-20 DIAGNOSIS — C5701 Malignant neoplasm of right fallopian tube: Secondary | ICD-10-CM | POA: Diagnosis not present

## 2016-01-20 DIAGNOSIS — N63 Unspecified lump in unspecified breast: Secondary | ICD-10-CM

## 2016-01-20 DIAGNOSIS — C561 Malignant neoplasm of right ovary: Secondary | ICD-10-CM | POA: Diagnosis not present

## 2016-01-20 DIAGNOSIS — Z95828 Presence of other vascular implants and grafts: Secondary | ICD-10-CM

## 2016-01-20 LAB — COMPREHENSIVE METABOLIC PANEL
ALT: 17 U/L (ref 0–55)
AST: 16 U/L (ref 5–34)
Albumin: 3.9 g/dL (ref 3.5–5.0)
Alkaline Phosphatase: 77 U/L (ref 40–150)
Anion Gap: 7 mEq/L (ref 3–11)
BUN: 12.6 mg/dL (ref 7.0–26.0)
CO2: 29 mEq/L (ref 22–29)
Calcium: 9.5 mg/dL (ref 8.4–10.4)
Chloride: 104 mEq/L (ref 98–109)
Creatinine: 0.7 mg/dL (ref 0.6–1.1)
EGFR: 85 mL/min/{1.73_m2} — ABNORMAL LOW (ref >90)
Glucose: 95 mg/dl (ref 70–140)
Potassium: 4.2 mEq/L (ref 3.5–5.1)
Sodium: 140 mEq/L (ref 136–145)
Total Bilirubin: 0.39 mg/dL (ref 0.20–1.20)
Total Protein: 6.9 g/dL (ref 6.4–8.3)

## 2016-01-20 LAB — CBC WITH DIFFERENTIAL/PLATELET
BASO%: 0.2 % (ref 0.0–2.0)
Basophils Absolute: 0 10*3/uL (ref 0.0–0.1)
EOS%: 7.1 % — ABNORMAL HIGH (ref 0.0–7.0)
Eosinophils Absolute: 0.4 10*3/uL (ref 0.0–0.5)
HCT: 41.7 % (ref 34.8–46.6)
HGB: 13.5 g/dL (ref 11.6–15.9)
LYMPH%: 34.1 % (ref 14.0–49.7)
MCH: 28.7 pg (ref 25.1–34.0)
MCHC: 32.4 g/dL (ref 31.5–36.0)
MCV: 88.7 fL (ref 79.5–101.0)
MONO#: 0.5 10*3/uL (ref 0.1–0.9)
MONO%: 9.7 % (ref 0.0–14.0)
NEUT#: 2.4 10*3/uL (ref 1.5–6.5)
NEUT%: 48.9 % (ref 38.4–76.8)
Platelets: 155 10*3/uL (ref 145–400)
RBC: 4.7 10*6/uL (ref 3.70–5.45)
RDW: 13.8 % (ref 11.2–14.5)
WBC: 5 10*3/uL (ref 3.9–10.3)
lymph#: 1.7 10*3/uL (ref 0.9–3.3)

## 2016-01-20 MED ORDER — HEPARIN SOD (PORK) LOCK FLUSH 100 UNIT/ML IV SOLN
500.0000 [IU] | Freq: Once | INTRAVENOUS | Status: AC | PRN
  Administered 2016-01-20: 500 [IU] via INTRAVENOUS
  Filled 2016-01-20: qty 5

## 2016-01-20 MED ORDER — SODIUM CHLORIDE 0.9 % IJ SOLN
10.0000 mL | INTRAMUSCULAR | Status: DC | PRN
  Administered 2016-01-20: 10 mL via INTRAVENOUS
  Filled 2016-01-20: qty 10

## 2016-01-20 NOTE — Telephone Encounter (Signed)
Gave pt cal & avs °

## 2016-01-20 NOTE — Progress Notes (Signed)
OFFICE PROGRESS NOTE   January 20, 2016   Physicians:Gehrig, Maryelizabeth Kaufmann, Abigail Butts, MD (PCP), Dohmeier, Asencion Partridge; (Sypher, R), rheumatology (?[place]), Ashok Pall.  New patient referral to [facility].  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 She completed the GOG 0225 diet and exercise study in October 2016.  PAC has not been a problem and, with poor peripheral access, she prefers to keep this for now.  Patient has felt reasonably well since seeing Dr Alycia Rossetti 09-25-15, next visit to her recommended in 6 months. She had an incisional hernia repair in May 2017. Recovering well with minimal incision pain. Moving bowels well.  Last mammograms were at [facility] in 10-2015; There was a right breast mass and a 6 month follow up U/S is recommended.    She has PAC, flushed today.   flu vaccine 06-03-15 Genetics testing negative spring 2015. 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 negatve post treatmen CT 9/14    Fallopian tube carcinoma   11/01/2012 Initial Diagnosis Fallopian tube carcinoma   11/08/2012 Surgery Debulking, optima. IA ovarian and IIIB fallopian tube   12/13/2012 - 03/28/2013 Chemotherapy s/p 6 cycles of paclitaxel and carboplatin  Patient presented in ~ Jan 2014 with complaints of abdominal distension and change in bowels. She had had unremarkable colonoscopy July 2013 (not in this EMR,  and patient does not recall which MD). She is post hysterectomy in 1990s. Abdominal symptoms did not improve with interventions for constipation, and CT AP was done in [facility] 11-01-12 with finding of large mid abdominal mass. She was referred to Dr Alycia Rossetti. Preop CA 125 is not available in this EMR; preop CXR had some linear scarring in lingula without acute findings. She went to exploratory laparotomy, bilateral salpingo-oophorectomy, appendectomy, and infacolic omentectomy, which was optimal debulking, by Dr Alycia Rossetti on 11-08-2012. Operative findings: 25 cm right adnexal mass with smooth surface. Surgically absent uterus. Atrophic-appearing left ovary. Within the omentum there were centimeter nodules scattered throughout. The remainder of the surfaces were benign. 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 She had PAC placed by IR and 6 cycles of taxol carbo from 12-13-12 thru 03-28-13, with neulasta support. She had negative BRCA and breast/ovarian cancer syndrome genetic screening in spring 2015         Review of systems as above, also: No fever. No HA or other neurologic symptoms.  Remainder of 10 point Review of Systems negative.  Objective:  Vital signs in last 24 hours:  BP 134/83 mmHg  Pulse 83  Temp(Src) 97.9 F (36.6 C) (Oral)  Resp 18  Ht 5' (1.524 m)  Wt 174 lb 14.4 oz (79.334 kg)  BMI 34.16 kg/m2  SpO2 100% Weight stable Alert, oriented and appropriate. Ambulatory without assistance. Looks comfortable, very pleasant as always  HEENT:PERRL, sclerae not icteric. Oral mucosa moist without lesions, posterior pharynx clear.  Neck supple. No JVD.  Lymphatics:no cervical,supraclavicular, axillary  or inguinal adenopathy Resp: clear to auscultation bilaterally and normal percussion bilaterally Cardio: regular rate and rhythm. No gallop. GI: Healing incision. soft, nontender, no mass or  organomegaly. Normally active bowel sounds.  Musculoskeletal/ Extremities: without pitting edema, cords, tenderness Neuro: speech fluent, strength symmetrical UE and LE. Skin without rash, ecchymosis, petechiae Breasts: bilaterally without dominant mass, skin or nipple findings. Axillae benign. Portacath-without erythema or tenderness  Lab Results:  Results for orders placed or performed in visit on 01/20/16  CBC with Differential  Result Value Ref Range   WBC 5.0 3.9 - 10.3 10e3/uL   NEUT# 2.4 1.5 - 6.5 10e3/uL   HGB 13.5 11.6 - 15.9 g/dL   HCT 41.7 34.8 - 46.6 %   Platelets 155 145 - 400 10e3/uL   MCV 88.7 79.5 - 101.0 fL   MCH 28.7 25.1 - 34.0 pg   MCHC 32.4 31.5 - 36.0 g/dL   RBC 4.70 3.70 - 5.45 10e6/uL   RDW 13.8 11.2 - 14.5 %   lymph# 1.7 0.9 - 3.3 10e3/uL   MONO# 0.5 0.1 - 0.9 10e3/uL   Eosinophils Absolute 0.4 0.0 - 0.5 10e3/uL   Basophils Absolute 0.0 0.0 - 0.1 10e3/uL   NEUT% 48.9 38.4 - 76.8 %   LYMPH% 34.1 14.0 - 49.7 %   MONO% 9.7 0.0 - 14.0 %   EOS% 7.1 (H) 0.0 - 7.0 %   BASO% 0.2 0.0 - 2.0 %  Comprehensive metabolic panel  Result Value Ref Range   Sodium 140 136 - 145 mEq/L   Potassium 4.2 3.5 - 5.1 mEq/L   Chloride 104 98 - 109 mEq/L   CO2 29 22 - 29 mEq/L   Glucose 95 70 - 140 mg/dl   BUN 12.6 7.0 - 26.0 mg/dL   Creatinine 0.7 0.6 - 1.1 mg/dL   Total Bilirubin 0.39 0.20 - 1.20 mg/dL   Alkaline Phosphatase 77 40 - 150 U/L   AST 16 5 - 34 U/L   ALT 17 0 - 55 U/L   Total Protein 6.9 6.4 - 8.3 g/dL   Albumin 3.9 3.5 - 5.0 g/dL   Calcium 9.5 8.4 - 10.4 mg/dL   Anion Gap 7 3 - 11 mEq/L   EGFR 85 (L) >90 ml/min/1.73 m2    CA 125 on 11-15-15   14.4   Studies/Results:  No results found.  Medications: I have reviewed the patient's current medications.   DISCUSSION:  Recovering well from recent hernia surgery. Order placed for right breast U/S for September 2017 to follow breast mass.  Keep PAC for now, flush every 6-8 weeks when not otherwise  used.  Gyn onc in Aug and I will see her coordinating with PAC flush + lab in Dec 2017.   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 Completed GOG 225 diet and exercise study Oct 2016. She will see Dr Alycia Rossetti in ~ Aug and will see Med Onc in  ~07/2016 with labs. 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  5. lumbar disc disease symptomatic with chronic LLE pain: Dr Ashok Pall has seen previously 6.elevated BP: PCP managing 7.obesity: BMI down from 38.7 to 34.6. GOG 225 diet and exercise study completed, encouraged her to continue diet and exercise 8.Narcolepsy- type disorder, fibromyalgia. On Nuvigil per Dr Brett Fairy. Sleep apnea has improved such that she  no longer needs CPAP 9.negative BRCA and breast/ovarian cancer syndrome testing spring 2015 10.recent loss of her husband 11.flu vaccine 06-03-15 12.peripheral neuropathy or paresthesia symptoms new in hands and feet: to discuss with Dr Dohmeier 13.Right breast mass; follow up U/S ordered today for 04-2016 14.never colonoscopy and multiple bowel movements daily (not new): refer to [facility] as PCP is in [facility] system    All questions answered and patient is in agreement with recommendations and plans. Time spent 20 min including >50% counseling and coordination of care. CC Dr Addison Lank, Dr Dohmeier, Marda Stalker, Erasmo Downer, NP   01/20/2016, 11:11 AM

## 2016-01-20 NOTE — Patient Instructions (Signed)

## 2016-01-21 ENCOUNTER — Other Ambulatory Visit: Payer: Self-pay | Admitting: Oncology

## 2016-01-21 ENCOUNTER — Telehealth: Payer: Self-pay

## 2016-01-21 LAB — CA 125: Cancer Antigen (CA) 125: 11.8 U/mL (ref 0.0–38.1)

## 2016-01-21 NOTE — Telephone Encounter (Signed)
-----   Message from Gordy Levan, MD sent at 01/21/2016  8:22 AM EDT ----- Labs seen and need follow up: please let her know ca 125 marker is lower at 11.8 and other labs good. She needs to see gyn onc in ~ Sept and I have sent Melissa Cross/ Georges Mouse a message on that now

## 2016-01-21 NOTE — Telephone Encounter (Signed)
lvm re attached message from LL

## 2016-01-21 NOTE — Telephone Encounter (Signed)
Tried to reach pt, unsuccessful

## 2016-01-24 NOTE — Telephone Encounter (Signed)
Reached Kristin Webb and told her the results and follow up plan as noted below by Dr. Marko Plume.

## 2016-03-13 ENCOUNTER — Encounter (HOSPITAL_COMMUNITY): Payer: Self-pay

## 2016-03-16 ENCOUNTER — Ambulatory Visit (HOSPITAL_BASED_OUTPATIENT_CLINIC_OR_DEPARTMENT_OTHER): Payer: Commercial Managed Care - HMO

## 2016-03-16 DIAGNOSIS — Z452 Encounter for adjustment and management of vascular access device: Secondary | ICD-10-CM

## 2016-03-16 DIAGNOSIS — Z95828 Presence of other vascular implants and grafts: Secondary | ICD-10-CM

## 2016-03-16 DIAGNOSIS — C5701 Malignant neoplasm of right fallopian tube: Secondary | ICD-10-CM | POA: Diagnosis not present

## 2016-03-16 MED ORDER — SODIUM CHLORIDE 0.9 % IJ SOLN
10.0000 mL | INTRAMUSCULAR | Status: DC | PRN
  Administered 2016-03-16: 10 mL via INTRAVENOUS
  Filled 2016-03-16: qty 10

## 2016-03-16 MED ORDER — HEPARIN SOD (PORK) LOCK FLUSH 100 UNIT/ML IV SOLN
500.0000 [IU] | Freq: Once | INTRAVENOUS | Status: AC | PRN
  Administered 2016-03-16: 500 [IU] via INTRAVENOUS
  Filled 2016-03-16: qty 5

## 2016-03-16 NOTE — Patient Instructions (Signed)

## 2016-04-14 ENCOUNTER — Other Ambulatory Visit: Payer: Commercial Managed Care - HMO

## 2016-04-20 ENCOUNTER — Ambulatory Visit: Payer: Commercial Managed Care - HMO | Admitting: Oncology

## 2016-04-20 ENCOUNTER — Other Ambulatory Visit: Payer: Commercial Managed Care - HMO

## 2016-04-22 ENCOUNTER — Encounter: Payer: Self-pay | Admitting: Gynecologic Oncology

## 2016-04-22 ENCOUNTER — Other Ambulatory Visit: Payer: Commercial Managed Care - HMO

## 2016-04-22 ENCOUNTER — Other Ambulatory Visit: Payer: Self-pay | Admitting: Gynecologic Oncology

## 2016-04-22 ENCOUNTER — Ambulatory Visit (HOSPITAL_BASED_OUTPATIENT_CLINIC_OR_DEPARTMENT_OTHER): Payer: Commercial Managed Care - HMO

## 2016-04-22 ENCOUNTER — Ambulatory Visit: Payer: Commercial Managed Care - HMO | Attending: Gynecologic Oncology | Admitting: Gynecologic Oncology

## 2016-04-22 VITALS — BP 143/85 | HR 93 | Temp 97.7°F | Resp 18 | Ht 60.0 in | Wt 177.8 lb

## 2016-04-22 DIAGNOSIS — C569 Malignant neoplasm of unspecified ovary: Secondary | ICD-10-CM | POA: Insufficient documentation

## 2016-04-22 DIAGNOSIS — C5701 Malignant neoplasm of right fallopian tube: Secondary | ICD-10-CM | POA: Diagnosis not present

## 2016-04-22 DIAGNOSIS — Z9221 Personal history of antineoplastic chemotherapy: Secondary | ICD-10-CM | POA: Diagnosis not present

## 2016-04-22 DIAGNOSIS — G4730 Sleep apnea, unspecified: Secondary | ICD-10-CM | POA: Insufficient documentation

## 2016-04-22 DIAGNOSIS — K5900 Constipation, unspecified: Secondary | ICD-10-CM | POA: Insufficient documentation

## 2016-04-22 DIAGNOSIS — Z888 Allergy status to other drugs, medicaments and biological substances status: Secondary | ICD-10-CM | POA: Insufficient documentation

## 2016-04-22 DIAGNOSIS — R1900 Intra-abdominal and pelvic swelling, mass and lump, unspecified site: Secondary | ICD-10-CM | POA: Insufficient documentation

## 2016-04-22 DIAGNOSIS — M25512 Pain in left shoulder: Secondary | ICD-10-CM

## 2016-04-22 DIAGNOSIS — K219 Gastro-esophageal reflux disease without esophagitis: Secondary | ICD-10-CM | POA: Insufficient documentation

## 2016-04-22 DIAGNOSIS — Z79899 Other long term (current) drug therapy: Secondary | ICD-10-CM | POA: Insufficient documentation

## 2016-04-22 DIAGNOSIS — N132 Hydronephrosis with renal and ureteral calculous obstruction: Secondary | ICD-10-CM | POA: Insufficient documentation

## 2016-04-22 DIAGNOSIS — Z87442 Personal history of urinary calculi: Secondary | ICD-10-CM | POA: Insufficient documentation

## 2016-04-22 DIAGNOSIS — Z8544 Personal history of malignant neoplasm of other female genital organs: Secondary | ICD-10-CM

## 2016-04-22 DIAGNOSIS — E785 Hyperlipidemia, unspecified: Secondary | ICD-10-CM | POA: Diagnosis not present

## 2016-04-22 DIAGNOSIS — Z9049 Acquired absence of other specified parts of digestive tract: Secondary | ICD-10-CM | POA: Insufficient documentation

## 2016-04-22 DIAGNOSIS — Z1501 Genetic susceptibility to malignant neoplasm of breast: Secondary | ICD-10-CM

## 2016-04-22 DIAGNOSIS — R109 Unspecified abdominal pain: Secondary | ICD-10-CM | POA: Diagnosis not present

## 2016-04-22 DIAGNOSIS — R197 Diarrhea, unspecified: Secondary | ICD-10-CM | POA: Diagnosis not present

## 2016-04-22 DIAGNOSIS — Z9889 Other specified postprocedural states: Secondary | ICD-10-CM | POA: Diagnosis not present

## 2016-04-22 DIAGNOSIS — M797 Fibromyalgia: Secondary | ICD-10-CM | POA: Insufficient documentation

## 2016-04-22 DIAGNOSIS — R5383 Other fatigue: Secondary | ICD-10-CM | POA: Diagnosis not present

## 2016-04-22 DIAGNOSIS — Z1509 Genetic susceptibility to other malignant neoplasm: Secondary | ICD-10-CM

## 2016-04-22 DIAGNOSIS — I10 Essential (primary) hypertension: Secondary | ICD-10-CM | POA: Insufficient documentation

## 2016-04-22 DIAGNOSIS — C562 Malignant neoplasm of left ovary: Secondary | ICD-10-CM

## 2016-04-22 DIAGNOSIS — Z8543 Personal history of malignant neoplasm of ovary: Secondary | ICD-10-CM | POA: Diagnosis not present

## 2016-04-22 DIAGNOSIS — Z1502 Genetic susceptibility to malignant neoplasm of ovary: Secondary | ICD-10-CM

## 2016-04-22 DIAGNOSIS — Z95828 Presence of other vascular implants and grafts: Secondary | ICD-10-CM

## 2016-04-22 MED ORDER — NYSTATIN 100000 UNIT/GM EX CREA: TOPICAL_CREAM | Freq: Two times a day (BID) | CUTANEOUS | Status: DC

## 2016-04-22 MED ORDER — SODIUM CHLORIDE 0.9 % IJ SOLN
10.0000 mL | INTRAMUSCULAR | Status: DC | PRN
  Administered 2016-04-22: 10 mL via INTRAVENOUS
  Filled 2016-04-22: qty 10

## 2016-04-22 MED ORDER — HEPARIN SOD (PORK) LOCK FLUSH 100 UNIT/ML IV SOLN
500.0000 [IU] | Freq: Once | INTRAVENOUS | Status: AC | PRN
  Administered 2016-04-22: 500 [IU] via INTRAVENOUS
  Filled 2016-04-22: qty 5

## 2016-04-22 MED ORDER — ZINC OXIDE 20 % EX OINT: 1.0000 "application " | TOPICAL_OINTMENT | Freq: Two times a day (BID) | CUTANEOUS | 12 refills | Status: DC

## 2016-04-22 NOTE — Patient Instructions (Signed)
Follow-up with Dr. Marko Plume in 3 months and return to see GYN oncology in 6 months. I have sent the prescription for the nystatin to your pharmacy. We will notify you of the results of your bloodwork from today.   Nystatin skin cream or ointment What is this medicine? NYSTATIN (nye STAT in) is an antifungal medicine. It is used to treat certain kinds of fungal or yeast infections of the skin. This medicine may be used for other purposes; ask your health care provider or pharmacist if you have questions. What should I tell my health care provider before I take this medicine? They need to know if you have any of these conditions: -an unusual or allergic reaction to nystatin, other foods, dyes or preservatives -pregnant or trying to get pregnant -breast-feeding How should I use this medicine? This medicine is for external use on the skin only. Follow the directions on the prescription label. Wash hands before and after use. If treating a hand or nail infection, wash hands before use only. Apply a thin layer of this medicine to cover the affected skin and surrounding area. You can cover the area with a sterile gauze dressing (bandage). Do not use an airtight bandage (such as a plastic-covered bandage). Do not get the medicine in your eyes. If you do, rinse out with plenty of cool tap water. Use the full course of treatment prescribed, even if you think the infection is getting better. Use at regular intervals. Do not use your medicine more often than directed. Do not use this medicine for any condition other than the one for which it was prescribed. Talk to your pediatrician regarding the use of this medicine in children. Special care may be needed. Overdosage: If you think you have taken too much of this medicine contact a poison control center or emergency room at once. NOTE: This medicine is only for you. Do not share this medicine with others. What if I miss a dose? If you miss a dose, use it as  soon as you can. If it is almost time for your next dose, use only that dose. Do not use double or extra doses. What may interact with this medicine? Interactions are not expected. Do not use any other skin products on the affected area without telling your doctor or health care professional. This list may not describe all possible interactions. Give your health care provider a list of all the medicines, herbs, non-prescription drugs, or dietary supplements you use. Also tell them if you smoke, drink alcohol, or use illegal drugs. Some items may interact with your medicine. What should I watch for while using this medicine? Tell your doctor or health care professional if your symptoms do not improve after 3 days. After bathing make sure that your skin is very dry. Fungal infections like moist conditions. Do not walk around barefoot. To help prevent reinfection, wear freshly washed cotton, not synthetic, clothing. What side effects may I notice from receiving this medicine? Side effects that usually do not require medical attention (report to your doctor or health care professional if they continue or are bothersome): -skin irritation This list may not describe all possible side effects. Call your doctor for medical advice about side effects. You may report side effects to FDA at 1-800-FDA-1088. Where should I keep my medicine? Keep out of the reach of children. Store at room temperature 15 to 30 degrees C (59 to 86 degrees F). Throw away any unused medicine after the expiration date. NOTE:  This sheet is a summary. It may not cover all possible information. If you have questions about this medicine, talk to your doctor, pharmacist, or health care provider.    2016, Elsevier/Gold Standard. (2008-02-15 15:40:45) your breast appointment tomorrow

## 2016-04-22 NOTE — Progress Notes (Signed)
Consult Note: Gyn-Onc  Kristin Webb 70 y.o. female  CC:  Chief Complaint  Patient presents with  . fallopian tube carcinoma,unspecified laterality    follow up visit  . Ovarian Cancer    HPI: Kristin Webb is a 70 year old, gravida 2 para 2, who noticed increasing abdominal swelling around the third week of January. Initially, the abdominal swelling was intermittent and would come and go. She related this to recent initiation of a calcium supplement. The abdominal swelling became more prevalent starting in February with abdominal pain reported also. In addition, she started having increasing constipation and formed stools, which was a change in her routine of 5-6 loose bowel movements a day chronically after her cholecystectomy. On November 01, 2012, she had a CT scan of the abdomen and pelvis which resulted no significant biliary dilation, no suspicious liver lesions, and the spleen, adrenal glands and pancreas appeared normal. 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 She has a large midabdominal mass measuring 17 x 22.7 cm x 20 cm cephalad, which was well circumscribed without calcifications. There is irregular thickened septations and areas of solid nodularity consistent with malignancy. There is no bowel obstruction and the mass appeared to be separate from the appendix. There was probable normal left ovarian tissue seen. The uterus is surgically absent. There is some soft tissue stranding at the base of the mesentery superior to the mass and some omental nodularity.   On November 08, 2012, she underwent an exploratory laparotomy, BSO, appendectomy, infracolic omentectomy, and optimal debulking. Operative findings included: 25 cm right adnexal mass with smooth surface. Surgically absent uterus. Atrophic-appearing left ovary. Normal appearing appendix. Within the omentum there were centimeter nodules scattered throughout the  omentum. The remainder of the surfaces were benign. Final pathology revealed:  1. 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 - BENIGN FALLOPIAN TUBE WITH NONSPECIFIC CHRONIC INFLAMMATION. 2. Ovary and fallopian tube, left - BENIGN OVARY; NEGATIVE FOR ATYPIA OR MALIGNANCY. - BENIGN FALLOPIAN TUBE; NEGATIVE FOR ATYPIA OR MALIGNANCY. 3. Omentum, resection for tumor - HIGH GRADE CARCINOMA, SEE COMMENT. 4. Appendix, Other than Incidental - FIBROUS OBLITERATION OF APPENDICEAL TIP. - NEGATIVE FOR MALIGNANCY.  She completed day 1, cycle 6 of 6 planned treatments of carboplatin and taxol on 03/28/13.   9/14 CT Findings: The liver is diffusely fatty infiltrated and measures 18.7 cm in cranial caudal length. No focal intrahepatic parenchymal abnormality. The spleen is unremarkable. The stomach, duodenum, pancreas and adrenal glands are unremarkable. The gallbladder is surgically absent. 11 x 9 x 11 mm nonobstructing stone is identified in the interpolar right kidney. Stable appearance of a 9 mm fatty lesion in the right kidney, likely a tiny angiomyolipoma. No abdominal aortic aneurysm. No free fluid in the abdomen. No abdominal lymphadenopathy. Haziness in the root of the small bowel mesentery is stable. Approximately 8 cm cranial to the umbilicus is an area of apparent focal midline fascial laxity. I cannot identify a discrete fascial defect to suggest an overt hernia. This is a wide-mouthed fascial bulge and some of the transverse colon projects out into this area of fascial bulging. No bowel wall thickening, adjacent edema, orfluid to suggest complication. Imaging through the pelvis shows no free intraperitoneal fluid. No pelvic sidewall lymphadenopathy. The uterus is surgically absent. The large  complex cystic lesion seen in the right central abdomen and pelvis has  been resected in the interval. No substantial diverticular disease in the colon. There is no colonic diverticulitis. Terminal ileum is normal. The appendix is not visualized, but there is no edema or inflammation in the region of the cecum. The right gonadal vein is enlarged and contains a central filling defect consistent with thrombus. Bone windows reveal no worrisome lytic or sclerotic osseous lesions.  IMPRESSION:  Interval resection of the large right pelvic and lower abdominal mass lesion with apparent omentectomy. No evidence for intraperitoneal free fluid on today's study. No discernible peritoneal lesions. 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  CT:11/16 IMPRESSION: 1. 12 mm obstructive calculus at the left ureteropelvic junction with moderate proximal hydronephrosis. 2. 2 small supraumbilical ventral hernias, one containing a shor segment of the mid transverse colon and the other containing a short segment of the mid small bowel. There is no associated evidence to suggest bowel incarceration or obstruction at this time. 3. Tiny locule of gas non dependently in the lumen of the urinary bladder. This is presumably iatrogenic related to recent catheterization for urinalysis. Alternatively, this could be seen in the setting of urinary tract infection with gas-forming organisms. Clinical correlation for history of recent catheterization is recommended. 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 5. Status post cholecystectomy. 6. Additional incidental findings, as above.  Interval History:  She was last seen by GYN oncology in February of this year. CA-125 at that time was normal. She was seen by the oncology nurse practitioner in June. At that time her CA-125 was 11.8. She had a colonoscopy about 1-2 years ago. She is on GOG 225.  She was seen by Mikey Bussing in June. At that time it was noted that she had an incisional hernia repair in May was recovering well with  minimal pain. She wants to continue keeping her port in as she has poor peripheral access. She has not had any recent imaging with regards to her ovarian cancer. She did have mammograms done in March. It was noted to have incidentally probable benign breast mass. They recommended 6 month follow-up of this.  She states that for the last month she's been very tired. She has an appointment upcoming with her neurologist to make sure that her sleep apnea is not the issue. She was steadily improving after her surgery and that she feels like she's been on a slow decline. She is looking forward to starting yoga and tai chi again. This summer she began having some soreness in her left arm and shoulder she thought that might be related to how she was sleeping. It is intermittent sometimes is quite painful other times it just feels a little numb in her fingers. Occasional she has difficulty walking her bra on today she cannot put her arm behind her shoulder. It does sound like it could be an issue with her rotator cuff. She did have a mammogram with a call back. She has an ultrasound scheduled for tomorrow. She is otherwise doing fairly well. She is very pleased to have her hernia repaired. There is no evidence of any recurrence at the time of the hernia repair and per the patient was minimal adhesive disease.  Review of Systems: Constitutional:  She denies any fevers.  + fatigue Skin:  No rash Cardiovascular: No chest pain,  No SOB Pulmonary: No cough Gastro Intestinal: No nausea, vomiting, constipation. No change in bowel movement.  Genitourinary: Denies vaginal bleeding and discharge.  Musculoskeletal: As above Neurologic: As above Psychology:  No changes, doing well.  Current Meds:  Outpatient Encounter Prescriptions as of 04/22/2016  Medication Sig Dispense Refill  . Armodafinil (NUVIGIL PO) Take 0.5 tablets by mouth.    . Calcium Carb-Cholecalciferol (CALCIUM 600+D3) 600-800 MG-UNIT TABS Take 1 tablet  by mouth daily.    . colestipol (COLESTID) 1 g tablet Take 1 g by mouth daily as needed (For diarrhea.).     Marland Kitchen diphenhydrAMINE (BENADRYL) 25 mg capsule Take 75 mg by mouth at bedtime.     . eszopiclone (LUNESTA) 2 MG TABS tablet Take 1 tablet (2 mg total) by mouth at bedtime as needed for sleep. Take immediately before bedtime (Patient taking differently: Take 1 mg by mouth at bedtime as needed for sleep. Take immediately before bedtime) 30 tablet 5  . FLUoxetine (PROZAC) 10 MG capsule Take 10 mg by mouth daily. Takes with a 11m capsule for her total dose of 380m    . Marland KitchenLUoxetine (PROZAC) 20 MG capsule Take 20 mg by mouth daily. Takes with a 1059mapsule for her total dose of 88m88m  . ibMarland Kitchenprofen (ADVIL,MOTRIN) 200 MG tablet Take 200 mg by mouth every 6 (six) hours as needed for pain.    . Multiple Vitamin (MULTIVITAMIN WITH MINERALS) TABS Take 1 tablet by mouth daily.    . rosuvastatin (CRESTOR) 10 MG tablet Take 10 mg by mouth at bedtime.     . triamterene-hydrochlorothiazide (MAXZIDE-25) 37.5-25 MG tablet Take 1 tablet by mouth daily. 30 tablet 1  . [DISCONTINUED] Cholecalciferol 1000 units tablet Take 2,000 Units by mouth daily.    . EPMarland KitchenNEPHrine (EPIPEN 2-PAK) 0.3 mg/0.3 mL DEVI Inject 0.3 mg into the muscle once as needed (For bee stings.). Reported on 01/20/2016    . [DISCONTINUED] Melatonin 10 MG TABS Take 10 mg by mouth at bedtime as needed (For sleep.).     No facility-administered encounter medications on file as of 04/22/2016.     Allergy:  Allergies  Allergen Reactions  . Ambien [Zolpidem Tartrate] Other (See Comments)    Sleep walking  . Bee Venom Hives    Difficulty breathing, carries an EPI-pen  . Cortisone Nausea And Vomiting, Swelling and Other (See Comments)    INJECTED CORTISONE ONLY, "sick to my stomach, throwing up, hand swelling, and pain."    Social Hx:   Social History   Social History  . Marital status: Widowed    Spouse name: BernIlona SorrelNumber of children: 2   . Years of education: Master's   Occupational History  . Not on file.   Social History Main Topics  . Smoking status: Never Smoker  . Smokeless tobacco: Never Used  . Alcohol use No  . Drug use: No  . Sexual activity: Not on file   Other Topics Concern  . Not on file   Social History Narrative   Patient is married (BerIlona Sorreld lives at home with her husband.   Patient has 2 children by birth and 13 children all together.   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   Patient drinks very little caffeine.             Past Surgical Hx:  Past Surgical History:  Procedure Laterality Date  . ABDOMINAL HYSTERECTOMY     early [id number]    . [place]     [place]  Left 07/09/2015   Procedure: CYSTOSCOPY WITH LEFT RETROGRADE PYELOGRAM/ LEFT URETERAL STENT PLACEMENT;  Surgeon: Ardis Hughs, MD;  Location: [facility];  Service: Urology;  Laterality: Left;  . EXPLORATORY LAPAROTOMY  11/08/12   BSO, appendectomy, omentectomy  . INCISIONAL HERNIA REPAIR N/A 12/25/2015   Procedure:  INCISIONAL HERNIA REPAIR;  Surgeon: Ralene Ok, MD;  Location: [facility];  Service: General;  Laterality: N/A;  . INSERTION OF MESH N/A 12/25/2015   Procedure: INSERTION OF MESH;  Surgeon: Ralene Ok, MD;  Location: [facility];  Service: General;  Laterality: N/A;  . LAPAROTOMY Bilateral 11/08/2012   Procedure: EXPLORATORY LAPAROTOMY TOTAL ABDOMINAL HYSTERECTOMY BILATERAL SALPINGO OOPHORECTOMY TUMOR DEBULKING ;  Surgeon: Imagene Gurney A. Alycia Rossetti, MD;  Location: [facility];  Service: Gynecology;  Laterality: Bilateral;  APPENDECTOMY / OMENTECTOMY  . LAPAROTOMY N/A 12/25/2015   Procedure: EXPLORATORY LAPAROTOMY;  Surgeon: Ralene Ok, MD;  Location: [facility];  Service: General;  Laterality: N/A;  . LYSIS OF ADHESION N/A 12/25/2015   Procedure: LYSIS OF ADHESION;  Surgeon: Ralene Ok, MD;  Location: [facility];  Service: General;  Laterality: N/A;   . TRIGGER FINGER RELEASE      Past Medical Hx:  Past Medical History:  Diagnosis Date  . Burning mouth syndrome   . Chronic fatigue   . Constipation   . Diarrhea    in the past after gallbladder removal  . Fibromyalgia   . GERD (gastroesophageal reflux disease)   . History of kidney stones 07/2015  . Hyperlipidemia   . Hypertension    borderline not on meds   . Lumbar disc disease   . Ovarian cancer ([place]) 2014  . Pelvic mass in female   . Pneumonia    hx of pneumonia as an infant  . PONV (postoperative nausea and vomiting)   . Shortness of breath    with exertion   . Sleep apnea    ;neurologist informed pt she no longer needed her CPAP machine  . Yeast infection     Oncology Hx:  Oncology History   IIIB serous carcinoma of right fallopian tube, treated with optimal debulking 11-08-2012 and adjuvant chemotherapy     Ovarian ca ([place])   11/01/2012 Initial Diagnosis    Ovarian ca      11/08/2012 Surgery    IA mucious LMP, IIIB serous FT carcinoma       - 03/28/2013 Chemotherapy    Completed cycle #6 of paclitaxel and carboplatin. negatve post treatmen CT 9/14       Fallopian tube carcinoma ([place])   11/01/2012 Initial Diagnosis    Fallopian tube carcinoma      11/08/2012 Surgery    Debulking, optima. IA ovarian and IIIB fallopian tube      12/13/2012 - 03/28/2013 Chemotherapy    s/p 6 cycles of paclitaxel and carboplatin       Family Hx:  Family History  Problem Relation Age of Onset  . Lung cancer Mother 32  . High blood pressure Mother   . High Cholesterol Mother   . Lung cancer Father 55    2 ppd smoker  . Parkinson's disease Father   . Kidney disease      Vitals:  Blood pressure (!) 143/85, pulse 93, temperature 97.7 F (36.5 C), temperature source Oral, resp. rate 18, height 5' (1.524 m), weight 177 lb 12.8 oz (80.6 kg), SpO2 100 %.  Physical Exam: General: Well developed, well nourished female in no acute distress. Alert and oriented x 3.  Head/Neck:  Supple without any enlargements.   Lymph node survey: No cervical, supraclavicular, or inguinal adenopathy.   Cardiovascular: Regular rate and rhythm.   Lungs: Clear to auscultation bilaterally.   Abdomen: Abdomen soft, non-tender and obese. Active bowel sounds in all quadrants. No evidence of a fluid wave. Midline abdominal incision well healed. No evidence of hernia or dye stasis. No rebound or guarding. No hepatosplenomegaly. He is under the pannus.  Groins: No lymphadenopathy  Genitourinary:  Vulva/vagina: Normal external female genitalia. No lesions.  Urethra: No lesions or masses.  Vagina: No masses or nodularity Rectal: Good tone, no palpable masses or nodularity.   Extremities: No bilateral cyanosis, edema, or clubbing.   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 She was completely resected to an R0. She's completed her chemotherapy and had a post treatment CT scan that was negative in 9/14 and most recently in 11/16. Her last CA 125 was normal. It has never been elevated.  She has no evidence of recurrent disease.  Per protocol she will follow up with Dr. Marko Plume in 3 months as scheduled and return to see me in approximately the 6 months. We will notify her of the results of her CA 25 from today. I called and nystatin to her pharmacy. She will follow-up with her neurologist regarding her increased fatigue and concerns or sleep apnea may be an issue now. She will follow-up with her primary physician regarding her left shoulder pain that does not like it could be consistent with a rotator cuff injury.  Nancy Marus A., MD 04/22/2016, 12:57 PM

## 2016-04-23 ENCOUNTER — Telehealth: Payer: Self-pay

## 2016-04-23 ENCOUNTER — Ambulatory Visit
Admission: RE | Admit: 2016-04-23 | Discharge: 2016-04-23 | Disposition: A | Discharge status: Home / Self Care | Payer: Commercial Managed Care - HMO | Source: Ambulatory Visit | Attending: Oncology | Admitting: Oncology

## 2016-04-23 ENCOUNTER — Other Ambulatory Visit: Payer: Self-pay | Admitting: Oncology

## 2016-04-23 DIAGNOSIS — N63 Unspecified lump in unspecified breast: Secondary | ICD-10-CM

## 2016-04-23 LAB — CA 125: Cancer Antigen (CA) 125: 7.8 U/mL (ref 0.0–38.1)

## 2016-04-23 NOTE — Telephone Encounter (Signed)
Orders received from [place] to contact the patient to update with CA 125 level of 7.8 ( WNL ). Attempted to contact the patient , no answer , left a detailed message with call back number provided if additional questions arise.

## 2016-04-27 ENCOUNTER — Other Ambulatory Visit: Payer: Self-pay | Admitting: Gynecologic Oncology

## 2016-04-27 DIAGNOSIS — B3749 Other urogenital candidiasis: Secondary | ICD-10-CM

## 2016-04-27 MED ORDER — NYSTATIN 100000 UNIT/GM EX CREA: 1.0000 "application " | TOPICAL_CREAM | Freq: Two times a day (BID) | CUTANEOUS | 1 refills | Status: DC

## 2016-05-11 ENCOUNTER — Ambulatory Visit (HOSPITAL_BASED_OUTPATIENT_CLINIC_OR_DEPARTMENT_OTHER): Payer: Commercial Managed Care - HMO

## 2016-05-11 ENCOUNTER — Telehealth: Payer: Self-pay | Admitting: Oncology

## 2016-05-11 DIAGNOSIS — Z95828 Presence of other vascular implants and grafts: Secondary | ICD-10-CM

## 2016-05-11 DIAGNOSIS — Z452 Encounter for adjustment and management of vascular access device: Secondary | ICD-10-CM | POA: Diagnosis not present

## 2016-05-11 DIAGNOSIS — C5701 Malignant neoplasm of right fallopian tube: Secondary | ICD-10-CM

## 2016-05-11 MED ORDER — SODIUM CHLORIDE 0.9 % IJ SOLN
10.0000 mL | INTRAMUSCULAR | Status: DC | PRN
  Administered 2016-05-11: 10 mL via INTRAVENOUS
  Filled 2016-05-11: qty 10

## 2016-05-11 MED ORDER — HEPARIN SOD (PORK) LOCK FLUSH 100 UNIT/ML IV SOLN
500.0000 [IU] | Freq: Once | INTRAVENOUS | Status: AC | PRN
  Administered 2016-05-11: 500 [IU] via INTRAVENOUS
  Filled 2016-05-11: qty 5

## 2016-05-11 NOTE — Telephone Encounter (Signed)
Patient requested copy of appointment schedule. Appointment schedule given to patient.

## 2016-05-11 NOTE — Patient Instructions (Signed)

## 2016-05-18 ENCOUNTER — Ambulatory Visit (INDEPENDENT_AMBULATORY_CARE_PROVIDER_SITE_OTHER): Payer: Commercial Managed Care - HMO | Admitting: Neurology

## 2016-05-18 ENCOUNTER — Encounter: Payer: Self-pay | Admitting: Neurology

## 2016-05-18 VITALS — BP 130/88 | HR 68 | Resp 20 | Ht 60.0 in | Wt 172.0 lb

## 2016-05-18 DIAGNOSIS — R5383 Other fatigue: Secondary | ICD-10-CM

## 2016-05-18 DIAGNOSIS — F5105 Insomnia due to other mental disorder: Secondary | ICD-10-CM

## 2016-05-18 DIAGNOSIS — K439 Ventral hernia without obstruction or gangrene: Secondary | ICD-10-CM | POA: Diagnosis not present

## 2016-05-18 DIAGNOSIS — F5101 Primary insomnia: Secondary | ICD-10-CM | POA: Diagnosis not present

## 2016-05-18 DIAGNOSIS — G4719 Other hypersomnia: Secondary | ICD-10-CM | POA: Diagnosis not present

## 2016-05-18 DIAGNOSIS — K146 Glossodynia: Secondary | ICD-10-CM

## 2016-05-18 DIAGNOSIS — F418 Other specified anxiety disorders: Secondary | ICD-10-CM

## 2016-05-18 MED ORDER — ARMODAFINIL 250 MG PO TABS: 250.0000 mg | ORAL_TABLET | Freq: Every day | ORAL | 3 refills | Status: DC

## 2016-05-18 MED ORDER — ESZOPICLONE 2 MG PO TABS: 1.0000 mg | ORAL_TABLET | Freq: Every evening | ORAL | 1 refills | Status: DC | PRN

## 2016-05-18 NOTE — Progress Notes (Signed)
[facility]  Provider:  Larey Seat, M D  Referring Provider: Cari Caraway, MD Primary Care Physician:  Cari Caraway, MD   Review sleep study.   HPI:  Kristin Webb is a 70 y.o. female , with  A history of OSA on CPAP -and persistent fatigue, is seen here as a  revisit  from Dr. Addison Lank for CPAP follow up/ compliance visit.   12-12-13, Since I have seen her last this patient was diagnosed with ovarian cancer, underwent surgery and chemotherapy. She currently struggles with a weak  abdominal and core musculatur  and it feels to her she had an abdominal hernia. She has tried a corsett , but this has aggravated her bulging discs in her spine.  The patient was originally referred by Dr. Sheryn Bison at the time with fatigue , pain, excessive sleepiness and burning mouth syndrome. In 2010 she developed transiently left mouth droop, and a TIA/ CVA work up was negative. EMG and nerve conduction studies were unrevealing , a rheumatology consult,  pain specialist , neurosurgical evaluation were unrevealing .  Labs revealed a vitamin D deficiency,  normal TSH and CMET, CBC.  Her neurosurgery consult for back pain was without results.  She was referred to me for a sleep consultation in 2009. The patient was diagnosed with sleep apnea on 03-07-08 with an AHI of 24.3 and in REM AHI of 82. Her BMI at the time was 37.9 , she was titrated to 10 cm water pressure on CPAP  which relieved her AHI but she still snored.  Kristin Webb's newest  sleep study dated 11-26-13 whowed  residual apnea. The study was therefore a normal polysomnography and not split into a titration parts. The AHI was 0.9, the RDI was 2.4 and the oxygen nadir was 87% time and his saturation was 33.6 minutes at or below 90% saturation of oxygen. Heart which was regular, normal sinus rhythm prevailed. She still sleeps better with Lunesta 2 mg could not get a good result from 1 mg pills. Fragmentation of sleep, alpha intrusion can  be seen in chronic pain and fatigue. She is not a coffee drinker, no caffeine to cut out. There is no pain in sleep. Sleep psychology referral is problematic with [facility].  Sleep hygiene was discussed.   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 She has compliantly  used a  Original machine,  New the CPAP  machine seems to have failed. I am unable to obtain any data. In addition the patient had remained excessively fatigued and daytime sleepy. Possibly , this could have been a paraneoplastic manifestation of ovarian malignancy.  She has been using Nuvigil  as tolerated which has given her improvement in life quality and energy. The last CPAP  download was on 05-06-12 with the 95% percentile pressure of 12 cm water, the residual AHI of 0.5 and an average of 5.9 hours of night-user time for CPAP.   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 Kristin Webb since being diagnosed with ovarian cancer has underwent a lot of emotional changes as well. Her oncologist Dr. Marko Plume feels that some of the nausea she experienced is not anxiety related but related to a hiatal hernia and placed her on Prilosec. So far since last week has not been a palpable results. The patient has also tried to wean off her sleep aids but became panicked and insomniac again.  Mr. Vondrak  had been admitted to the hospital after a spot on his  lung was incidentally found , while being evaluated for kidney stones. He went into acute respiratory distress was placed on oxygen oxygen and readmitted to the hospital but never recovered. And during that time she needed to return to her previous insomnia regimen.  She is under financial distress, had a lot of difficulties since her husbands death.  Interval history from 29-May-2016,  Kristin Webb has been able to cut down on her Lunesta dose from 2 mg to 1 mg  and uses if necessary Nuvigil to drive. She has slept usually uninterrupted as of last summer also thinks have changed again in 2017 now she begun in August to wake up 3-4 times a week in AM with a headache, very unusual for her. Nausea is associated with these headaches, no photophobia or phonophobia is reported. And as of August she has needed and used Nuvigil 125 g daily again. No waking up in the night, told that she snores by a roommate during a church trip. Her fatigue is reminiscent of the times when she had suffered from sleep apnea.   Review of Systems: Out of a complete 14 system review, the patient complains of only the following symptoms, and all other reviewed systems are negative. Fatigue , EDS, abdominal distention, dry mouth.  She uses Lunesta for insomnia, doubled initially the dose at the time of her tumour diagnosis.  She has endorsed the Epworth score at 10 points, fatigue severity 45 points, geriatric depression score 2 out of 15.  Snoring again !?     Social History   Social History  . Marital status: Widowed    Spouse name: [name]  . Number of children: 2  . Years of education: Master's   Occupational History  . Not on file.   Social History Main Topics  . Smoking status: Never Smoker  . Smokeless tobacco: Never Used  . Alcohol use No  . Drug use: No  . Sexual activity: Not on file   Other Topics Concern  . Not on file   Social History Narrative   Patient is married [name]) and lives at home with her husband.   Patient has 2 children by birth and 13 children all together.   Patient has a Oceanographer.   Patient is right-handed.   Patient drinks very little caffeine.             Family History  Problem Relation Age of Onset  . Lung cancer Mother 75  . High blood pressure Mother   . High Cholesterol Mother   . Lung cancer Father 64    2 ppd smoker  . Parkinson's disease Father   . Kidney disease      Past Medical History:  Diagnosis Date  .  Burning mouth syndrome   . Chronic fatigue   . Constipation   . Diarrhea    in the past after gallbladder removal  . Fibromyalgia   . GERD (gastroesophageal reflux disease)   . History of kidney stones 07/2015  . Hyperlipidemia   . Hypertension    borderline not on meds   . Lumbar disc disease   . Ovarian cancer ([place]) 2014  . Pelvic mass in female   . Pneumonia    hx of pneumonia as an infant  . PONV (postoperative nausea and vomiting)   . Shortness of breath    with exertion   . Sleep apnea    ;neurologist informed  pt she no longer needed her CPAP machine  . Yeast infection     Past Surgical History:  Procedure Laterality Date  . ABDOMINAL HYSTERECTOMY     early 48s  . APPENDECTOMY    . CHOLECYSTECTOMY     early 16s  . CYSTOSCOPY W/ URETERAL STENT PLACEMENT Left 07/09/2015   Procedure: CYSTOSCOPY WITH LEFT RETROGRADE PYELOGRAM/ LEFT URETERAL STENT PLACEMENT;  Surgeon: Ardis Hughs, MD;  Location: [facility];  Service: Urology;  Laterality: Left;  . EXPLORATORY LAPAROTOMY  11/08/12   BSO, appendectomy, omentectomy  . INCISIONAL HERNIA REPAIR N/A 12/25/2015   Procedure:  INCISIONAL HERNIA REPAIR;  Surgeon: Ralene Ok, MD;  Location: [facility];  Service: General;  Laterality: N/A;  . INSERTION OF MESH N/A 12/25/2015   Procedure: INSERTION OF MESH;  Surgeon: Ralene Ok, MD;  Location: [facility];  Service: General;  Laterality: N/A;  . LAPAROTOMY Bilateral 11/08/2012   Procedure: EXPLORATORY LAPAROTOMY TOTAL ABDOMINAL HYSTERECTOMY BILATERAL SALPINGO OOPHORECTOMY TUMOR DEBULKING ;  Surgeon: Imagene Gurney A. Alycia Rossetti, MD;  Location: [facility];  Service: Gynecology;  Laterality: Bilateral;  APPENDECTOMY / OMENTECTOMY  . LAPAROTOMY N/A 12/25/2015   Procedure: EXPLORATORY LAPAROTOMY;  Surgeon: Ralene Ok, MD;  Location: [facility];  Service: General;  Laterality: N/A;  . LYSIS OF ADHESION N/A 12/25/2015   Procedure: LYSIS OF ADHESION;  Surgeon: Ralene Ok, MD;  Location: [facility];   Service: General;  Laterality: N/A;  . TRIGGER FINGER RELEASE      Current Outpatient Prescriptions  Medication Sig Dispense Refill  . Armodafinil (NUVIGIL PO) Take 0.5 tablets by mouth.    . Calcium Carb-Cholecalciferol (CALCIUM 600+D3) 600-800 MG-UNIT TABS Take 1 tablet by mouth daily.    . colestipol (COLESTID) 1 g tablet Take 1 g by mouth daily as needed (For diarrhea.).     Marland Kitchen diphenhydrAMINE (BENADRYL) 25 mg capsule Take 50 mg by mouth at bedtime.     Marland Kitchen EPINEPHrine (EPIPEN 2-PAK) 0.3 mg/0.3 mL DEVI Inject 0.3 mg into the muscle once as needed (For bee stings.). Reported on 01/20/2016    . eszopiclone (LUNESTA) 2 MG TABS tablet Take 1 tablet (2 mg total) by mouth at bedtime as needed for sleep. Take immediately before bedtime (Patient taking differently: Take 1 mg by mouth at bedtime as needed for sleep. Take immediately before bedtime) 30 tablet 5  . FLUoxetine (PROZAC) 10 MG capsule Take 10 mg by mouth daily. Takes with a 20mg  capsule for her total dose of 30mg .    . FLUoxetine (PROZAC) 20 MG capsule Take 20 mg by mouth daily. Takes with a 10mg  capsule for her total dose of 30mg .    . ibuprofen (ADVIL,MOTRIN) 200 MG tablet Take 200 mg by mouth every 6 (six) hours as needed for pain.    . Multiple Vitamin (MULTIVITAMIN WITH MINERALS) TABS Take 1 tablet by mouth daily.    Marland Kitchen nystatin cream (MYCOSTATIN) Apply 1 application topically 2 (two) times daily. Yeast under pannus 30 g 1  . rosuvastatin (CRESTOR) 10 MG tablet Take 10 mg by mouth at bedtime.     . triamterene-hydrochlorothiazide (MAXZIDE-25) 37.5-25 MG tablet Take 1 tablet by mouth daily. 30 tablet 1   No current facility-administered medications for this visit.     Allergies as of 05/18/2016 - Review Complete 05/18/2016  Allergen Reaction Noted  . Ambien [zolpidem tartrate] Other (See Comments) 12/10/2015  . Bee venom Hives 11/02/2012  . Cortisone Nausea And Vomiting, Swelling, and Other (See Comments) 11/02/2012    Vitals:  BP  130/88   Pulse 68   Resp 20   Ht 5' (1.524 m)   Wt 172 lb (78 kg)   BMI 33.59 kg/m  Last Weight:  Wt Readings from Last 1 Encounters:  05/18/16 172 lb (78 kg)   Last Height:   Ht Readings from Last 1 Encounters:  05/18/16 5' (1.524 m)    Physical exam: Regained weight again. Fatigued again.  General: The patient is awake, alert and appears not in acute distress. The patient is well groomed. Head: Normocephalic, atraumatic. Neck is supple. Mallampati 3 , neck circumference:  Cardiovascular:  Regular rate and rhythm , without  murmurs or carotid bruit, and without distended neck veins. Respiratory: Lungs are clear to auscultation. Skin:  Without evidence of edema, or rash Trunk: BMI remains  Elevated, this  patient has normal posture.  Neurologic exam : The patient is awake and alert, oriented to place and time.  Memory subjective  described as intact. There is a normal attention span & concentration ability. Speech is fluent without  dysarthria, dysphonia or aphasia. Mood and affect are appropriate.  Cranial nerves: Pupils are equal and briskly reactive to light. Funduscopic exam without evidence of pallor or edema.  Extraocular movements  in vertical and horizontal planes intact and without nystagmus. Visual fields by finger perimetry are intact. Hearing to finger rub intact.  Facial sensation intact to fine touch. Facial motor strength is symmetric and tongue and uvula move midline. Motor exam:  Normal tone , l muscle bulk and symmetric strength in all extremities. Grip strength is improved.  Gait and station: Patient walks without assistive device , Strength within normal limits. Stance is stable and normal.  Steps are unfragmented.  Deep tendon reflexes: in the  upper and lower extremities are symmetric and intact.    Assessment:  After physical and neurologic examination, review of laboratory studies, imaging, neurophysiology testing and pre-existing records, assessment is    1) OSA : resolved as of 2016  When her AHI was 0.9.  Now fatigued and grieving - since her husband died.  Will order a HST or ONO, depending what insurance permitted.  2)  ovarian cancer , anxiety and grief. follows with hospice and Cancer center . Abdominal hernia- surgically corrected.  3) a spot on her lung has not grown since 2016 .    Plan: Lunesta 1 mg refill. Nuvigil refill   weaned off Oxycontin.  ONO or HST ordered.  Rv with NP in 6 weeks if ONO/ HST is abnormal.    Ventura Leggitt, MD

## 2016-05-19 ENCOUNTER — Telehealth: Payer: Self-pay | Admitting: Neurology

## 2016-05-19 NOTE — Telephone Encounter (Signed)
Patient is calling. She was seen in our office yesterday and Rx's Armodafinil (NUVIGIL) 250 MG tablet and eszopiclone (LUNESTA) 2 MG TABS tablet were to be sent to [facility] on [address] [facility] [employer] but they say they never received them. Please resend.

## 2016-05-19 NOTE — Telephone Encounter (Signed)
I spoke to Kristin Webb and advised her that she needs to check her paperwork from yesterday because it appears that Dr. Brett Fairy gave her those prescriptions. She found the RXs and will take them to her pharmacy. Kristin Webb verbalized understanding.

## 2016-05-20 ENCOUNTER — Telehealth: Payer: Self-pay

## 2016-05-20 NOTE — Telephone Encounter (Signed)
Completed pa for nuvigil. Sent to [facility]. Should have a determination in 3-5 business days.

## 2016-05-21 NOTE — Telephone Encounter (Signed)
PA approved by [facility] until 05/20/2018.

## 2016-05-21 NOTE — Telephone Encounter (Signed)
I spoke to [facility]'s [facility] and advised them of this. Claim was run, armodafinil was approved.

## 2016-06-10 ENCOUNTER — Encounter (INDEPENDENT_AMBULATORY_CARE_PROVIDER_SITE_OTHER): Payer: Commercial Managed Care - HMO | Admitting: Neurology

## 2016-06-10 DIAGNOSIS — R5383 Other fatigue: Secondary | ICD-10-CM

## 2016-06-10 DIAGNOSIS — K146 Glossodynia: Secondary | ICD-10-CM

## 2016-06-10 DIAGNOSIS — G4710 Hypersomnia, unspecified: Secondary | ICD-10-CM

## 2016-06-10 DIAGNOSIS — K439 Ventral hernia without obstruction or gangrene: Secondary | ICD-10-CM

## 2016-06-10 DIAGNOSIS — G4719 Other hypersomnia: Secondary | ICD-10-CM

## 2016-06-10 DIAGNOSIS — F5101 Primary insomnia: Secondary | ICD-10-CM

## 2016-06-18 ENCOUNTER — Telehealth: Payer: Self-pay

## 2016-06-18 NOTE — Telephone Encounter (Signed)
I called pt to discuss sleep study results. No answer, left a message asking her to call me back. 

## 2016-06-19 NOTE — Telephone Encounter (Signed)
Turned patient's call, no answer . CD

## 2016-06-19 NOTE — Telephone Encounter (Signed)
Pt returned Kristen's call °

## 2016-06-22 NOTE — Telephone Encounter (Signed)
I spoke to pt. I advised her that his HST results did not reveal clinically significant sleep apnea, hypoxemia, or tachy brady arrhythmia. Pt verbalized understanding of results. Pt had no questions at this time but was encouraged to call back if questions arise. Pt requested a follow up appt with Dr. Brett Fairy. Appt made for 07/15/2016 at 11:45. Pt verbalized understanding. Copy of HST sent to Dr. Addison Lank per pt request.

## 2016-06-25 DIAGNOSIS — R5383 Other fatigue: Secondary | ICD-10-CM | POA: Diagnosis not present

## 2016-06-25 DIAGNOSIS — Z23 Encounter for immunization: Secondary | ICD-10-CM | POA: Diagnosis not present

## 2016-06-25 DIAGNOSIS — E559 Vitamin D deficiency, unspecified: Secondary | ICD-10-CM | POA: Diagnosis not present

## 2016-06-25 DIAGNOSIS — M75102 Unspecified rotator cuff tear or rupture of left shoulder, not specified as traumatic: Secondary | ICD-10-CM | POA: Diagnosis not present

## 2016-07-06 ENCOUNTER — Ambulatory Visit (HOSPITAL_BASED_OUTPATIENT_CLINIC_OR_DEPARTMENT_OTHER): Payer: Commercial Managed Care - HMO

## 2016-07-06 DIAGNOSIS — Z95828 Presence of other vascular implants and grafts: Secondary | ICD-10-CM

## 2016-07-06 DIAGNOSIS — Z452 Encounter for adjustment and management of vascular access device: Secondary | ICD-10-CM | POA: Diagnosis not present

## 2016-07-06 DIAGNOSIS — C5701 Malignant neoplasm of right fallopian tube: Secondary | ICD-10-CM

## 2016-07-06 MED ORDER — HEPARIN SOD (PORK) LOCK FLUSH 100 UNIT/ML IV SOLN
500.0000 [IU] | Freq: Once | INTRAVENOUS | Status: AC | PRN
  Administered 2016-07-06: 500 [IU] via INTRAVENOUS
  Filled 2016-07-06: qty 5

## 2016-07-06 MED ORDER — SODIUM CHLORIDE 0.9 % IJ SOLN
10.0000 mL | INTRAMUSCULAR | Status: DC | PRN
Start: 2016-07-06 — End: 2016-07-06
  Administered 2016-07-06: 10 mL via INTRAVENOUS
  Filled 2016-07-06: qty 10

## 2016-07-13 DIAGNOSIS — H43813 Vitreous degeneration, bilateral: Secondary | ICD-10-CM | POA: Diagnosis not present

## 2016-07-13 DIAGNOSIS — H2513 Age-related nuclear cataract, bilateral: Secondary | ICD-10-CM | POA: Diagnosis not present

## 2016-07-13 DIAGNOSIS — H04123 Dry eye syndrome of bilateral lacrimal glands: Secondary | ICD-10-CM | POA: Diagnosis not present

## 2016-07-13 DIAGNOSIS — H11823 Conjunctivochalasis, bilateral: Secondary | ICD-10-CM | POA: Diagnosis not present

## 2016-07-15 ENCOUNTER — Encounter: Payer: Self-pay | Admitting: Neurology

## 2016-07-15 ENCOUNTER — Ambulatory Visit (INDEPENDENT_AMBULATORY_CARE_PROVIDER_SITE_OTHER): Payer: Commercial Managed Care - HMO | Admitting: Neurology

## 2016-07-15 VITALS — BP 122/78 | HR 92 | Resp 16 | Ht 60.0 in | Wt 177.0 lb

## 2016-07-15 DIAGNOSIS — R5383 Other fatigue: Secondary | ICD-10-CM | POA: Diagnosis not present

## 2016-07-15 DIAGNOSIS — Z8543 Personal history of malignant neoplasm of ovary: Secondary | ICD-10-CM

## 2016-07-15 DIAGNOSIS — M3500 Sicca syndrome, unspecified: Secondary | ICD-10-CM | POA: Insufficient documentation

## 2016-07-15 DIAGNOSIS — F418 Other specified anxiety disorders: Secondary | ICD-10-CM | POA: Diagnosis not present

## 2016-07-15 DIAGNOSIS — K146 Glossodynia: Secondary | ICD-10-CM

## 2016-07-15 DIAGNOSIS — F5101 Primary insomnia: Secondary | ICD-10-CM

## 2016-07-15 DIAGNOSIS — F5105 Insomnia due to other mental disorder: Secondary | ICD-10-CM

## 2016-07-15 MED ORDER — ARMODAFINIL 250 MG PO TABS: 250.0000 mg | ORAL_TABLET | Freq: Every day | ORAL | 3 refills | Status: DC

## 2016-07-15 MED ORDER — ESZOPICLONE 2 MG PO TABS: 1.0000 mg | ORAL_TABLET | Freq: Every evening | ORAL | 1 refills | Status: DC | PRN

## 2016-07-15 NOTE — Patient Instructions (Signed)
Modafinil tablets  What is this medicine?  MODAFINIL (moe DAF i nil) is used to treat excessive sleepiness caused by certain sleep disorders. This includes narcolepsy, sleep apnea, and shift work sleep disorder.  This medicine may be used for other purposes; ask your health care provider or pharmacist if you have questions.  COMMON BRAND NAME(S): Provigil  What should I tell my health care provider before I take this medicine?  They need to know if you have any of these conditions:  -history of depression, mania, or other mental disorder  -kidney disease  -liver disease  -an unusual or allergic reaction to modafinil, other medicines, foods, dyes, or preservatives  -pregnant or trying to get pregnant  -breast-feeding  How should I use this medicine?  Take this medicine by mouth with a glass of water. Follow the directions on the prescription label. Take your doses at regular intervals. Do not take your medicine more often than directed. Do not stop taking except on your doctor's advice.  A special MedGuide will be given to you by the pharmacist with each prescription and refill. Be sure to read this information carefully each time.  Talk to your pediatrician regarding the use of this medicine in children. This medicine is not approved for use in children.  Overdosage: If you think you have taken too much of this medicine contact a poison control center or emergency room at once.  NOTE: This medicine is only for you. Do not share this medicine with others.  What if I miss a dose?  If you miss a dose, take it as soon as you can. If it is almost time for your next dose, take only that dose. Do not take double or extra doses.  What may interact with this medicine?  Do not take this medicine with any of the following medications:  -amphetamine or dextroamphetamine  -dexmethylphenidate or methylphenidate  -medicines called MAO Inhibitors like Nardil, Parnate, Marplan, Eldepryl  -pemoline  -procarbazine  This medicine may  also interact with the following medications:  -antifungal medicines like itraconazole or ketoconazole  -barbiturates like phenobarbital  -birth control pills or other hormone-containing birth control devices or implants  -carbamazepine  -cyclosporine  -diazepam  -medicines for depression, anxiety, or psychotic disturbances  -phenytoin  -propranolol  -triazolam  -warfarin  This list may not describe all possible interactions. Give your health care provider a list of all the medicines, herbs, non-prescription drugs, or dietary supplements you use. Also tell them if you smoke, drink alcohol, or use illegal drugs. Some items may interact with your medicine.  What should I watch for while using this medicine?  Visit your doctor or health care professional for regular checks on your progress. The full effects of this medicine may not be seen right away.  This medicine may affect your concentration, function, or may hide signs that you are tired. You may get dizzy. Do not drive, use machinery, or do anything that needs mental alertness until you know how this drug affects you. Alcohol can make you more dizzy and may interfere with your response to this medicine or your alertness. Avoid alcoholic drinks.  Birth control pills may not work properly while you are taking this medicine. Talk to your doctor about using an extra method of birth control.  It is unknown if the effects of this medicine will be increased by the use of caffeine. Caffeine is available in many foods, beverages, and medications. Ask your doctor if you should limit or   swelling of the face, lips, or tongue -anxiety -breathing problems -chest pain -fast, irregular  heartbeat -hallucinations -increased blood pressure -redness, blistering, peeling or loosening of the skin, including inside the mouth -sore throat, fever, or chills -suicidal thoughts or other mood changes -tremors -vomiting Side effects that usually do not require medical attention (report to your doctor or health care professional if they continue or are bothersome): -headache -nausea, diarrhea, or stomach upset -nervousness -trouble sleeping This list may not describe all possible side effects. Call your doctor for medical advice about side effects. You may report side effects to FDA at 1-800-FDA-1088. Where should I keep my medicine? Keep out of the reach of children. This medicine can be abused. Keep your medicine in a safe place to protect it from theft. Do not share this medicine with anyone. Selling or giving away this medicine is dangerous and against the law. This medicine may cause accidental overdose and death if taken by other adults, children, or pets. Mix any unused medicine with a substance like cat litter or coffee grounds. Then throw the medicine away in a sealed container like a sealed bag or a coffee can with a lid. Do not use the medicine after the expiration date. Store at room temperature between 20 and 25 degrees C (68 and 77 degrees F). NOTE: This sheet is a summary. It may not cover all possible information. If you have questions about this medicine, talk to your doctor, pharmacist, or health care provider.  2017 Elsevier/Gold Standard (2014-04-17 15:34:55)

## 2016-07-15 NOTE — Progress Notes (Signed)
[facility]  Provider:  Larey Seat, M D  Referring Provider: Cari Caraway, MD Primary Care Physician:  Cari Caraway, MD   Review sleep study.   HPI:  Kristin Webb is a 70 y.o. female , with  A history of OSA on CPAP -and persistent fatigue, is seen here as a  revisit  from Dr. Addison Lank for CPAP follow up/ compliance visit.   12-12-13, Since I have seen her last this patient was diagnosed with ovarian cancer, underwent surgery and chemotherapy. She currently struggles with a weak  abdominal and core musculatur  and it feels to her she had an abdominal hernia. She has tried a corsett , but this has aggravated her bulging discs in her spine.  The patient was originally referred by Dr. Sheryn Bison at the time with fatigue , pain, excessive sleepiness and burning mouth syndrome. In 2010 she developed transiently left mouth droop, and a TIA/ CVA work up was negative. EMG and nerve conduction studies were unrevealing , a rheumatology consult,  pain specialist , neurosurgical evaluation were unrevealing .  Labs revealed a vitamin D deficiency,  normal TSH and CMET, CBC.  Her neurosurgery consult for back pain was without results.  She was referred to me for a sleep consultation in 2009. The patient was diagnosed with sleep apnea on 03-07-08 with an AHI of 24.3 and in REM AHI of 82. Her BMI at the time was 37.9 , she was titrated to 10 cm water pressure on CPAP  which relieved her AHI but she still snored.  Kristin Webb's newest  sleep study dated 11-26-13 whowed  residual apnea. The study was therefore a normal polysomnography and not split into a titration parts. The AHI was 0.9, the RDI was 2.4 and the oxygen nadir was 87% time and his saturation was 33.6 minutes at or below 90% saturation of oxygen. Heart which was regular, normal sinus rhythm prevailed. She still sleeps better with Lunesta 2 mg could not get a good result from 1 mg pills. Fragmentation of sleep, alpha intrusion can  be seen in chronic pain and fatigue. She is not a coffee drinker, no caffeine to cut out. There is no pain in sleep. Sleep psychology referral is problematic with [facility].  Sleep hygiene was discussed.   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 She has compliantly  used a  Original machine,  New the CPAP  machine seems to have failed. I am unable to obtain any data. In addition the patient had remained excessively fatigued and daytime sleepy. Possibly , this could have been a paraneoplastic manifestation of ovarian malignancy.  She has been using Nuvigil  as tolerated which has given her improvement in life quality and energy. The last CPAP  download was on 05-06-12 with the 95% percentile pressure of 12 cm water, the residual AHI of 0.5 and an average of 5.9 hours of night-user time for CPAP.   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 Kristin Webb since being diagnosed with ovarian cancer has underwent a lot of emotional changes as well. Her oncologist Dr. Marko Plume feels that some of the nausea she experienced is not anxiety related but related to a hiatal hernia and placed her on Prilosec. So far since last week has not been a palpable results. The patient has also tried to wean off her sleep aids but became panicked and insomniac again. Mr. Whitehill had  been admitted to the hospital after a spot on his  lung was incidentally found , while being evaluated for kidney stones. He went into acute respiratory distress was placed on oxygen oxygen and readmitted to the hospital but never recovered. And during that time she needed to return to her previous insomnia regimen.  She is under financial distress, had a lot of difficulties since her husbands death.  CD Interval history from 05/18/2016, Kristin Webb has been able to cut down on her Lunesta dose from 2 mg to 1 mg and  uses if necessary Nuvigil to drive. She has slept usually uninterrupted as of last summer also thinks have changed again in 2017 now she begun in August to wake up 3-4 times a week in AM with a headache, very unusual for her. Nausea is associated with these headaches, no photophobia or phonophobia is reported. And as of August she has needed and used Nuvigil 125 g daily again. No waking up in the night, told that she snores by a roommate during a church trip. Her fatigue is reminiscent of the times when she had suffered from sleep apnea.   CD Interval history from 07/15/2016, Kristin Webb is here to see the results of her recent home sleep test, performed on 06/10/2016. Her AHI was 2.2 her RDI was 5 per hour there was no significant sleep apnea noted no oxygen desaturation was found, and she did not have tachycardia or bradycardia arrhythmia. Her fatigue has remained her Epworth sleepiness score has actually increased to 12 point from 10 during our last visit. She states that she often only gets up in the morning before because she has to let the dog out but then feels that she needs another hour of sleep afterwards. She also suffers from dry mouth and dry eyes. There is a possible autoimmune component, and she does suffer from fibromyalgia.  She does not endorse a significant depression score since she lost her husband she has been grieving but she has not "fallen into a deep hole". She sleeps with the help of Lunesta 1 mg and Benadryl, she has failed Ambien which caused her to sleep walk. She was also significantly more drowsy and daytime. She could neither tolerate 10 not 5 mg.    Review of Systems: Out of a complete 14 system review, the patient complains of only the following symptoms, and all other reviewed systems are negative. Fatigue , EDS, abdominal distention, dry mouth. Dry eyes, chemotherapy related ? Morning headaches and morning nausea reported.  She uses Lunesta for insomnia, doubled  the dose at the time of her tumour diagnosis. 1 mg of Lunesta and 25 mg of Benadryl at night.   She has endorsed the Epworth score at 12 points, fatigue severity  50 from 45 points, geriatric depression score 3 out of 15.     Social History   Social History  . Marital status: Widowed    Spouse name: [name]  . Number of children: 2  . Years of education: Master's   Occupational History  . Not on file.   Social History Main Topics  . Smoking status: Never Smoker  . Smokeless tobacco: Never Used  . Alcohol use No  . Drug use: No  . Sexual activity: Not on file   Other Topics Concern  . Not on file   Social History Narrative   Patient is married [name]) and lives at home with her husband.   Patient has 2 children by birth and 3  children all together.   Patient has a Oceanographer.   Patient is right-handed.   Patient drinks very little caffeine.             Family History  Problem Relation Age of Onset  . Lung cancer Mother 15  . High blood pressure Mother   . High Cholesterol Mother   . Lung cancer Father 47    2 ppd smoker  . Parkinson's disease Father   . Kidney disease      Past Medical History:  Diagnosis Date  . Burning mouth syndrome   . Chronic fatigue   . Constipation   . Diarrhea    in the past after gallbladder removal  . Fibromyalgia   . GERD (gastroesophageal reflux disease)   . History of kidney stones 07/2015  . Hyperlipidemia   . Hypertension    borderline not on meds   . Lumbar disc disease   . Ovarian cancer ([place]) 2014  . Pelvic mass in female   . Pneumonia    hx of pneumonia as an infant  . PONV (postoperative nausea and vomiting)   . Shortness of breath    with exertion   . Sleep apnea    ;neurologist informed pt she no longer needed her CPAP machine  . Yeast infection     Past Surgical History:  Procedure Laterality Date  . ABDOMINAL HYSTERECTOMY     early 13s  . APPENDECTOMY    . CHOLECYSTECTOMY     early 28s  .  CYSTOSCOPY W/ URETERAL STENT PLACEMENT Left 07/09/2015   Procedure: CYSTOSCOPY WITH LEFT RETROGRADE PYELOGRAM/ LEFT URETERAL STENT PLACEMENT;  Surgeon: Ardis Hughs, MD;  Location: [facility];  Service: Urology;  Laterality: Left;  . EXPLORATORY LAPAROTOMY  11/08/12   BSO, appendectomy, omentectomy  . INCISIONAL HERNIA REPAIR N/A 12/25/2015   Procedure:  INCISIONAL HERNIA REPAIR;  Surgeon: Ralene Ok, MD;  Location: [facility];  Service: General;  Laterality: N/A;  . INSERTION OF MESH N/A 12/25/2015   Procedure: INSERTION OF MESH;  Surgeon: Ralene Ok, MD;  Location: [facility];  Service: General;  Laterality: N/A;  . LAPAROTOMY Bilateral 11/08/2012   Procedure: EXPLORATORY LAPAROTOMY TOTAL ABDOMINAL HYSTERECTOMY BILATERAL SALPINGO OOPHORECTOMY TUMOR DEBULKING ;  Surgeon: Imagene Gurney A. Alycia Rossetti, MD;  Location: [facility];  Service: Gynecology;  Laterality: Bilateral;  APPENDECTOMY / OMENTECTOMY  . LAPAROTOMY N/A 12/25/2015   Procedure: EXPLORATORY LAPAROTOMY;  Surgeon: Ralene Ok, MD;  Location: [facility];  Service: General;  Laterality: N/A;  . LYSIS OF ADHESION N/A 12/25/2015   Procedure: LYSIS OF ADHESION;  Surgeon: Ralene Ok, MD;  Location: [facility];  Service: General;  Laterality: N/A;  . TRIGGER FINGER RELEASE      Current Outpatient Prescriptions  Medication Sig Dispense Refill  . Armodafinil (NUVIGIL) 250 MG tablet Take 1 tablet (250 mg total) by mouth daily. 45 tablet 3  . Calcium Carbonate-Vitamin D (CALCIUM-D PO) Take by mouth.    . colestipol (COLESTID) 1 g tablet Take 1 g by mouth daily as needed (For diarrhea.).     Marland Kitchen diphenhydrAMINE (BENADRYL) 25 mg capsule Take 50 mg by mouth at bedtime.     Marland Kitchen EPINEPHrine (EPIPEN 2-PAK) 0.3 mg/0.3 mL DEVI Inject 0.3 mg into the muscle once as needed (For bee stings.). Reported on 01/20/2016    . eszopiclone (LUNESTA) 2 MG TABS tablet Take 0.5 tablets (1 mg total) by mouth at bedtime as needed for sleep. Take immediately before bedtime 45 tablet 1  .  FLUoxetine (PROZAC) 10 MG capsule Take 10 mg by mouth daily. Takes with a 20mg  capsule for her total dose of 30mg .    . FLUoxetine (PROZAC) 20 MG capsule Take 20 mg by mouth daily. Takes with a 10mg  capsule for her total dose of 30mg .    . ibuprofen (ADVIL,MOTRIN) 200 MG tablet Take 200 mg by mouth every 6 (six) hours as needed for pain.    . Multiple Vitamin (MULTIVITAMIN WITH MINERALS) TABS Take 1 tablet by mouth daily.    . rosuvastatin (CRESTOR) 10 MG tablet Take 10 mg by mouth at bedtime.     . triamterene-hydrochlorothiazide (MAXZIDE-25) 37.5-25 MG tablet Take 1 tablet by mouth daily. 30 tablet 1   No current facility-administered medications for this visit.     Allergies as of 07/15/2016 - Review Complete 07/15/2016  Allergen Reaction Noted  . Ambien [zolpidem tartrate] Other (See Comments) 12/10/2015  . Bee venom Hives 11/02/2012  . Cortisone Nausea And Vomiting, Swelling, and Other (See Comments) 11/02/2012    Vitals: BP 122/78   Pulse 92   Resp 16   Ht 5' (1.524 m)   Wt 177 lb (80.3 kg)   BMI 34.57 kg/m  Last Weight:  Wt Readings from Last 1 Encounters:  07/15/16 177 lb (80.3 kg)   Last Height:   Ht Readings from Last 1 Encounters:  07/15/16 5' (1.524 m)    Physical exam: Regained weight again. Fatigued again.  General: The patient is awake, alert and appears not in acute distress.  She reports fatigue, is well groomed. Head: Normocephalic,  Neck is supple. Mallampati 3 , neck circumference: 15. Retrognathia. Dry mouth . Dental  Decay  Cardiovascular:  Regular rate and rhythm without  murmurs or carotid bruit, and without distended neck veins. Respiratory: Lungs are clear to auscultation. Skin:  Without evidence of edema, or rash Trunk: BMI remains elevated, 35 . Neurologic exam : The patient is awake and alert, oriented to place and time.   Memory subjective  described as intact. There is a normal attention span & concentration ability.  Speech is fluent  without  dysarthria, dysphonia or aphasia. Mood and affect are appropriate.  Cranial nerves: Pupils are equal and briskly reactive to light. Funduscopic exam without evidence of pallor or edema.  Extraocular movements  in vertical and horizontal planes intact and without nystagmus. Visual fields by finger perimetry are intact. Hearing to finger rub intact.  Facial sensation intact to fine touch. Facial motor strength is symmetric and tongue and uvula move midline. Motor exam:  Normal tone , l muscle bulk and symmetric strength in all extremities. Grip strength is improved.  Gait and station: Patient walks without assistive device , Strength within normal limits. Stance is stable and normal.  Steps are unfragmented.  Deep tendon reflexes: in the  upper and lower extremities are symmetric and intact.    Assessment:  After physical and neurologic examination, review of laboratory studies, imaging, neurophysiology testing and pre-existing records, assessment is   1) OSA : resolved as of 2016 ( AHI was 0.9).   Now  After HST again  No OSA found,  AHI was 2.2,  fatigued and grieving - since her husband died. Fatigue improved prn modafinil.  2)  Ovarian cancer survivor , anxiety and grief. follows with hospice and Cancer center .  3)Abdominal hernia- surgically corrected.  4) a spot on her lung has not grown since 2016, followed by her PCP.  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    Plan: Lunesta 1 mg refill. Nuvigil refill. Take nuvigil at Am hours.  Weaned off Oxycontin. No pain.  Remains fatigued while not having clinically significant apnea. May need evaluation for autoimmune disease or para-neoplastic syndrome.     Sansa Alkema, MD   Cc Cari Caraway. MD/  Aura Dials, MD

## 2016-07-19 ENCOUNTER — Other Ambulatory Visit: Payer: Self-pay | Admitting: Oncology

## 2016-07-20 ENCOUNTER — Ambulatory Visit (HOSPITAL_BASED_OUTPATIENT_CLINIC_OR_DEPARTMENT_OTHER): Payer: Commercial Managed Care - HMO

## 2016-07-20 ENCOUNTER — Other Ambulatory Visit (HOSPITAL_BASED_OUTPATIENT_CLINIC_OR_DEPARTMENT_OTHER): Payer: Commercial Managed Care - HMO

## 2016-07-20 ENCOUNTER — Ambulatory Visit (HOSPITAL_BASED_OUTPATIENT_CLINIC_OR_DEPARTMENT_OTHER): Payer: Commercial Managed Care - HMO | Admitting: Oncology

## 2016-07-20 VITALS — BP 165/86 | HR 93 | Temp 97.5°F | Resp 20 | Ht 60.0 in | Wt 180.0 lb

## 2016-07-20 DIAGNOSIS — C561 Malignant neoplasm of right ovary: Secondary | ICD-10-CM

## 2016-07-20 DIAGNOSIS — C5701 Malignant neoplasm of right fallopian tube: Secondary | ICD-10-CM | POA: Diagnosis not present

## 2016-07-20 DIAGNOSIS — R5383 Other fatigue: Secondary | ICD-10-CM

## 2016-07-20 DIAGNOSIS — Z95828 Presence of other vascular implants and grafts: Secondary | ICD-10-CM

## 2016-07-20 DIAGNOSIS — E669 Obesity, unspecified: Secondary | ICD-10-CM

## 2016-07-20 LAB — COMPREHENSIVE METABOLIC PANEL
ALT: 25 U/L (ref 0–55)
AST: 20 U/L (ref 5–34)
Albumin: 3.8 g/dL (ref 3.5–5.0)
Alkaline Phosphatase: 99 U/L (ref 40–150)
Anion Gap: 11 mEq/L (ref 3–11)
BUN: 18 mg/dL (ref 7.0–26.0)
CO2: 29 mEq/L (ref 22–29)
Calcium: 11.5 mg/dL — ABNORMAL HIGH (ref 8.4–10.4)
Chloride: 101 mEq/L (ref 98–109)
Creatinine: 0.7 mg/dL (ref 0.6–1.1)
EGFR: 84 mL/min/{1.73_m2} — ABNORMAL LOW (ref >90)
Glucose: 101 mg/dl (ref 70–140)
Potassium: 3.8 mEq/L (ref 3.5–5.1)
Sodium: 141 mEq/L (ref 136–145)
Total Bilirubin: 0.38 mg/dL (ref 0.20–1.20)
Total Protein: 7.3 g/dL (ref 6.4–8.3)

## 2016-07-20 LAB — CBC WITH DIFFERENTIAL/PLATELET
BASO%: 1.1 % (ref 0.0–2.0)
Basophils Absolute: 0.1 10*3/uL (ref 0.0–0.1)
EOS%: 3.3 % (ref 0.0–7.0)
Eosinophils Absolute: 0.2 10*3/uL (ref 0.0–0.5)
HCT: 44.7 % (ref 34.8–46.6)
HGB: 14.3 g/dL (ref 11.6–15.9)
LYMPH%: 30.7 % (ref 14.0–49.7)
MCH: 28.1 pg (ref 25.1–34.0)
MCHC: 32 g/dL (ref 31.5–36.0)
MCV: 87.7 fL (ref 79.5–101.0)
MONO#: 0.5 10*3/uL (ref 0.1–0.9)
MONO%: 8.6 % (ref 0.0–14.0)
NEUT#: 3.5 10*3/uL (ref 1.5–6.5)
NEUT%: 56.3 % (ref 38.4–76.8)
Platelets: 148 10*3/uL (ref 145–400)
RBC: 5.1 10*6/uL (ref 3.70–5.45)
RDW: 13.9 % (ref 11.2–14.5)
WBC: 6.2 10*3/uL (ref 3.9–10.3)
lymph#: 1.9 10*3/uL (ref 0.9–3.3)

## 2016-07-20 MED ORDER — SODIUM CHLORIDE 0.9 % IJ SOLN
10.0000 mL | INTRAMUSCULAR | Status: DC | PRN
Start: 2016-07-20 — End: 2016-07-20
  Administered 2016-07-20: 10 mL via INTRAVENOUS
  Filled 2016-07-20: qty 10

## 2016-07-20 MED ORDER — HEPARIN SOD (PORK) LOCK FLUSH 100 UNIT/ML IV SOLN
500.0000 [IU] | Freq: Once | INTRAVENOUS | Status: AC | PRN
  Administered 2016-07-20: 500 [IU] via INTRAVENOUS
  Filled 2016-07-20: qty 5

## 2016-07-20 NOTE — Progress Notes (Signed)
OFFICE PROGRESS NOTE   July 21, 2016   Physicians: Silvestre Moment, Abigail Butts, MD (PCP), Dohmeier, Asencion Partridge; (Sypher, R), rheumatology (?[place]), Ashok Pall. Laurence Spates. 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 She also completed GOG 0225 diet and exercise study 05-2015.  Last imaging was CT AP 07-09-15. She saw Dr Alycia Rossetti [phone] and will see her again in ~ 10-2016.  She had follow up mammograms and bilateral breast US at [facility], findings appear benign, next due bilateral diagnostic mammograms and right [place] 10-2016 (last bilateral mammo 10-10-15 and right [place] 04-23-16)  She had mesh repair of incarcerated abdominal ventral hernia by Dr Rosendo Gros 12-25-15. She has healed well from that surgery and is much more comfortable than prior to the repair.  She had evaluation for chronic diarrhea by Dr Oletta Lamas, unfortunately medications not helpful, still ~ 6 loose stools daily. She is up to date on colonoscopy.  She has had adjustment in meds for narcolepsy by Dr Brett Fairy, and has recently decreased hs benadryl and lunesta doses. She had home sleep apnea study which reportedly was negative. She has needed to increase Nuvigil to daily dosing, previously just prn She has extremely dry eyes, which may be aggravated by the benadryl.   Patient has no complaints that seem of concern from history of the gyn cancer or that treatment. In addition to above, primary complaint is of extreme fatigue. She has pushed herself to resume [employer] program [facility], able to ride stationary bike x 4 miles now and does feel a little better overall with this. She slept only ~ 5 hrs last pm due to her dog. She does not know if thyroid has been checked recently. She has increased oral calcium on her own  recently, does not recall any concerns about Ca++ levels by other MDs. No fever or symptoms of infection. Appetite fine. No SOB or other respiratory symptoms. Chronic back pain no worse, may be slightly better since repair of ventral hernia. No bleeding. No LE swelling. Better ROM left shoulder with exercises by PCP.  Mouth stays dry, eyes dry.  No problems with PAC, which is not used by any other MDs and was not used for recent surgery Remainder of 10 point Review of Systems negative   PACplaced by IR 12-2012. Flushed 07-20-16  flu vaccine Genetics testing negative spring 2015. ECOG 0-1 (chronic back and LE symptoms not related to oncologic diagnosis CA 125 has never been elevated  ONCOLOGIC HISTORY Patient presented in ~ Jan 2014 with complaints of abdominal distension and change in bowels. She had had unremarkable colonoscopy July 2013 (not in this EMR, and patient does not recall which MD). She is post hysterectomy in 1990s. Abdominal symptoms did not improve with interventions for constipation, and CT AP was done in [place] system 11-01-12 with finding of large mid abdominal mass. She was referred to Dr Alycia Rossetti. Preop CA 125 is not available in this EMR; preop CXR had some linear scarring in lingula without acute findings. She went to exploratory laparotomy, bilateral salpingo-oophorectomy, appendectomy, and infacolic omentectomy, which was optimal debulking, by Dr Alycia Rossetti on 11-08-2012. Operative findings: 25 cm right adnexal mass with smooth surface. Surgically absent uterus. Atrophic-appearing left ovary. Within the omentum there were centimeter nodules scattered throughout. The remainder of the surfaces were benign. Pathology showed high grade serous  carcinoma of right fallopian tube as well as 28 cm mucinous borderline tumor of right ovary. She had PAC placed by IR and 6 cycles of taxol carbo from 12-13-12 thru 03-28-13, with neulasta support. She had negative BRCA and breast/ovarian cancer syndrome  genetic screening in spring 2015     Objective:  Vital signs in last 24 hours: 165/86, 93, 20, 97.5, 99%, 180 lb, BMI 35.2 Weight is up 3 lbs  Alert, oriented and appropriate, very pleasant as always. Ambulatory without assistance   HEENT:PERRL, sclerae not icteric. Oral mucosa not markedly dry,  without lesions, posterior pharynx clear.  Neck supple. No JVD.  Lymphatics:no cervical,supraclavicular, axillary or inguinal adenopathy Resp: clear to auscultation bilaterally and normal percussion bilaterally Cardio: regular rate and rhythm. No gallop. GI: abdomen obese, soft, nontender, not distended, no mass or organomegaly. Normally active bowel sounds. Surgical incision well healed. Still protuberance upper mid abdomen, but no obvious hernia now from umbilicus down. Musculoskeletal/ Extremities: LE without pitting edema, cords, tenderness Neuro: no peripheral neuropathy. Otherwise nonfocal. PSYCH mood and affect good, does not appear depressed  Skin without rash, ecchymosis, petechiae Breasts: bilaterally without dominant mass, skin or nipple findings. Axillae benign. Portacath-without erythema or tenderness  Lab Results:  Results for orders placed or performed in visit on 07/20/16  CBC with Differential/Platelet  Result Value Ref Range   WBC 6.2 3.9 - 10.3 10e3/uL   NEUT# 3.5 1.5 - 6.5 10e3/uL   HGB 14.3 11.6 - 15.9 g/dL   HCT 44.7 34.8 - 46.6 %   Platelets 148 145 - 400 10e3/uL   MCV 87.7 79.5 - 101.0 fL   MCH 28.1 25.1 - 34.0 pg   MCHC 32.0 31.5 - 36.0 g/dL   RBC 5.10 3.70 - 5.45 10e6/uL   RDW 13.9 11.2 - 14.5 %   lymph# 1.9 0.9 - 3.3 10e3/uL   MONO# 0.5 0.1 - 0.9 10e3/uL   Eosinophils Absolute 0.2 0.0 - 0.5 10e3/uL   Basophils Absolute 0.1 0.0 - 0.1 10e3/uL   NEUT% 56.3 38.4 - 76.8 %   LYMPH% 30.7 14.0 - 49.7 %   MONO% 8.6 0.0 - 14.0 %   EOS% 3.3 0.0 - 7.0 %   BASO% 1.1 0.0 - 2.0 %  Comprehensive metabolic panel  Result Value Ref Range   Sodium 141 136 - 145 mEq/L    Potassium 3.8 3.5 - 5.1 mEq/L   Chloride 101 98 - 109 mEq/L   CO2 29 22 - 29 mEq/L   Glucose 101 70 - 140 mg/dl   BUN 18.0 7.0 - 26.0 mg/dL   Creatinine 0.7 0.6 - 1.1 mg/dL   Total Bilirubin 0.38 0.20 - 1.20 mg/dL   Alkaline Phosphatase 99 40 - 150 U/L   AST 20 5 - 34 U/L   ALT 25 0 - 55 U/L   Total Protein 7.3 6.4 - 8.3 g/dL   Albumin 3.8 3.5 - 5.0 g/dL   Calcium 11.5 (H) 8.4 - 10.4 mg/dL   Anion Gap 11 3 - 11 mEq/L   EGFR 84 (L) >90 ml/min/1.73 m2    NOTE calcium 11.5   Studies/Results: ULTRASOUND OF THE RIGHT BREAST  COMPARISON:  Previous exam(s).  FINDINGS: On physical exam, no suspicious masses are identified.  Targeted ultrasound is performed, showing a calcified fibroadenoma which has been stable mammographically for years. There is a mass in the right breast at 9:30, 6 cm from the nipple adjacent to the calcified fibroadenoma measuring 10 x 4 x 9 mm  today versus 12 by 5 x 11 mm previously. No significant interval change.  IMPRESSION: Stable probably benign right breast mass.  RECOMMENDATION: Six-month follow-up ultrasound of the probably benign right breast mass. The patient will be due for bilateral mammography at that time.  I have discussed the findings and recommendations with the patient. Results were also provided in writing at the conclusion of the visit. If applicable, a reminder letter will be sent to the patient regarding the next appointment.  BI-RADS CATEGORY  3: Probably benign finding(s) - short interval follow-up suggested.  EXAM: DIGITAL DIAGNOSTIC BILATERAL MAMMOGRAM  ULTRASOUND BILATERAL BREAST  COMPARISON:  Previous exam(s).  ACR Breast Density Category c: The breast tissue is heterogeneously dense, which may obscure small masses.  FINDINGS: The possible breast masses seen at screening mammography appear to efface, representing glandular tissue, on additional imaging.  On physical exam, no suspicious  lumps.  Targeted ultrasound is performed, showing no masses to correlate with mammographic findings. An incidentally seen mass is seen in the right breast at 930, 6 cm from the nipple measuring 11 x 5 x 12 mm. This is immediately adjacent to the calcified fibroadenoma seen mammographically.  IMPRESSION: Incidentally found probably benign right breast mass. No other suspicious findings.  RECOMMENDATION: Six-month follow-up ultrasound of the probably benign right breast mass.  I have discussed the findings and recommendations with the patient. Results were also provided in writing at the conclusion of the visit. If applicable, a reminder letter will be sent to the patient regarding the next appointment.  BI-RADS CATEGORY  3: Probably benign.  Medications: I have reviewed the patient's current medications. She took calcium supplement this AM, will DC that now. She can get Vit D3 600 - 800 units without calcium.   DISCUSSION Fatigue concerns discussed, clinically nothing that seems of concern from standpoint of the gyn cancer. Note calcium elevated at 11.5, tho did take calcium supplement prior to blood draw today. Recommended stopping the calcium tablets and would suggest follow up by Dr Addison Lank in ~ a month, also consider checking TFTs if not done.   Discussed removing PAC due to  risk of infection or other complication from foreign body that is not needed for her care now.   Peripheral venous access has been adequate for recent surgery and for blood draws at other MD offices. Patient initially was not open to consideration of removal, however by end of discussion she will likely agree to having this removed if she is doing well at Dr Elenora Gamma next visit. Need to keep PAC flushed ~ every 6-8 weeks until taken out. IR correct to remove this when patient agrees.  Patient aware that another medical oncologist will be working with the gyn oncologists after first of year. Dr Alycia Rossetti  may decide to have her follow only with gyn oncology ( in which case could cancel med onc in ~ July 2018).     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 Completed GOG 225 diet and exercise study Oct 2016. She will see Dr Alycia Rossetti in ~ March. 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 This information to PCP and patient aware that she should have calcium repeated there. 3.large ventral hernia repaired with mesh 12/2015  4.PAC in: needs flush every 6-8 weeks. Would be reasonable to have this removed  by IR if patient agrees after next visit with Dr Alycia Rossetti 5. lumbar disc disease symptomatic with chronic LLE pain 6.HTN followed by PCP 7.obesity: BMI 35.  GOG 225 diet and exercise study completed, encouraged her to continue diet and exercise 8.Narcolepsy- type disorder, fibromyalgia. On Nuvigil per Dr Brett Fairy. Sleep apnea has improved such that she no longer needs CPAP 9.negative BRCA and breast/ovarian cancer syndrome testing spring 2015 10.multiple bowel movements daily: up to date on colonoscopy. Evaluated by Dr Oletta Lamas. 11.flu vaccine 05-20-16 12. Apparent benign right breast mass: due mammograms and repeat right [place] [facility] in March   All questions answered and patient is in agreement with recommendations and plans. Time spent 25 min including >50% counseling and coordination of care. Cc Dr Addison Lank including CMET with elevated calcium. Cc Drs Brett Fairy, Oletta Lamas.   Message to collaborative RNs re PAC flushes.    Evlyn Clines, MD   07/21/2016, 10:08 PM

## 2016-07-21 ENCOUNTER — Encounter: Payer: Self-pay | Admitting: Oncology

## 2016-07-21 ENCOUNTER — Telehealth: Payer: Self-pay | Admitting: Neurology

## 2016-07-21 DIAGNOSIS — C5701 Malignant neoplasm of right fallopian tube: Secondary | ICD-10-CM | POA: Insufficient documentation

## 2016-07-21 NOTE — Telephone Encounter (Signed)
Kristin Webb states eszopiclone (LUNESTA) 2 MG TABS tablet will not be covered by her insurance in 2018 by [facility]. She says a letter needs to be written. Please call and discuss after 4:30 today.

## 2016-07-22 ENCOUNTER — Telehealth: Payer: Self-pay | Admitting: Nurse Practitioner

## 2016-07-22 NOTE — Telephone Encounter (Signed)
Per Dr. Mariana Kaufman note, patient to see [name] in approx March 2018; apt made for 12/02/16 at 10:30. Patient has flush apt that day at 8 am, and she was told she could check in with clinic after flush apt and we would see her earlier if able. She verbalizes understanding, denies issues or complaints at this time and knows to call clinic if this changes.

## 2016-07-22 NOTE — Progress Notes (Signed)
I agree with the assessment and plan -The patient is known to me .   Ramzi Brathwaite, MD  

## 2016-07-23 NOTE — Telephone Encounter (Signed)
I spoke to patient and advised her that we cannot appeal right now. Medication will have to be denied next year by insurance first, then we can do PA. Patient voiced understanding.

## 2016-08-18 ENCOUNTER — Telehealth: Payer: Self-pay

## 2016-08-18 DIAGNOSIS — F5105 Insomnia due to other mental disorder: Secondary | ICD-10-CM

## 2016-08-18 DIAGNOSIS — F418 Other specified anxiety disorders: Secondary | ICD-10-CM

## 2016-08-18 DIAGNOSIS — K146 Glossodynia: Secondary | ICD-10-CM

## 2016-08-18 DIAGNOSIS — F5101 Primary insomnia: Secondary | ICD-10-CM

## 2016-08-18 NOTE — Telephone Encounter (Signed)
PA for eszopiclone completed and sent to [facility]. Should have a determination in 1-3 business days.

## 2016-08-19 NOTE — Telephone Encounter (Signed)
I called pt. Pt's eszopiclone was denied by [facility] because she has not tried belsomra or trazodone. I advised pt of the website goodrx.com and asked her to look and see if she was able to afford the medication out of pocket with the coupons provided at a local pharmacy. If not, I can speak with Dr. Brett Fairy about switching her to another insomnia med. Pt says that she is going to research goodrx.com and let me know which pharmacy she would like to use with the coupon from goodrx.com so I can fax them an RX. Pt will call me back.

## 2016-08-24 MED ORDER — ESZOPICLONE 2 MG PO TABS: 1.0000 mg | ORAL_TABLET | Freq: Every evening | ORAL | 1 refills | Status: DC | PRN

## 2016-08-24 MED ORDER — ESZOPICLONE 2 MG PO TABS: 2.0000 mg | ORAL_TABLET | Freq: Every evening | ORAL | 1 refills | Status: DC | PRN

## 2016-08-24 MED ORDER — ESZOPICLONE 3 MG PO TABS: 3.0000 mg | ORAL_TABLET | Freq: Every day | ORAL | 2 refills | Status: DC

## 2016-08-24 NOTE — Telephone Encounter (Signed)
  Refilled lunesta to be send directly to the patient's pharmacy

## 2016-08-24 NOTE — Telephone Encounter (Signed)
Pt called me back,  I was skyped and was able to take the call. Pt says that she does want to proceed with using the goodrx coupon for generic lunesta and wants the RX sent to [facility] on [address].  Pt is asking for an increase in the lunesta to take 1 tablet qhs (instead of the 1/2 tablet as prescribed) and for a 90 day supply. Will send to Dr. Brett Fairy for review.

## 2016-08-25 NOTE — Telephone Encounter (Signed)
I called pt. I advised her that Dr. Brett Fairy did increase the lunesta to 2mg  qhs prn for 90 tablets and I will fax it to [facility] on [address]. Pt verbalized understanding. Received a receipt of confirmation.

## 2016-09-02 DIAGNOSIS — H52209 Unspecified astigmatism, unspecified eye: Secondary | ICD-10-CM | POA: Diagnosis not present

## 2016-09-02 DIAGNOSIS — H524 Presbyopia: Secondary | ICD-10-CM | POA: Diagnosis not present

## 2016-09-02 DIAGNOSIS — Z0100 Encounter for examination of eyes and vision without abnormal findings: Secondary | ICD-10-CM | POA: Diagnosis not present

## 2016-09-02 DIAGNOSIS — H5203 Hypermetropia, bilateral: Secondary | ICD-10-CM | POA: Diagnosis not present

## 2016-09-23 DIAGNOSIS — M3500 Sicca syndrome, unspecified: Secondary | ICD-10-CM | POA: Diagnosis not present

## 2016-09-23 DIAGNOSIS — Z6837 Body mass index (BMI) 37.0-37.9, adult: Secondary | ICD-10-CM | POA: Diagnosis not present

## 2016-09-23 DIAGNOSIS — E669 Obesity, unspecified: Secondary | ICD-10-CM | POA: Diagnosis not present

## 2016-09-29 DIAGNOSIS — H5111 Convergence insufficiency: Secondary | ICD-10-CM | POA: Diagnosis not present

## 2016-09-29 DIAGNOSIS — H43813 Vitreous degeneration, bilateral: Secondary | ICD-10-CM | POA: Diagnosis not present

## 2016-09-29 DIAGNOSIS — H04123 Dry eye syndrome of bilateral lacrimal glands: Secondary | ICD-10-CM | POA: Diagnosis not present

## 2016-09-29 DIAGNOSIS — H2513 Age-related nuclear cataract, bilateral: Secondary | ICD-10-CM | POA: Diagnosis not present

## 2016-10-12 ENCOUNTER — Telehealth: Payer: Self-pay

## 2016-10-12 NOTE — Telephone Encounter (Signed)
lvm that per chart last port flush was in December and next appt is April 25. Inbasket sent for PAC flush appt in next week or 2.

## 2016-10-12 NOTE — Telephone Encounter (Signed)
-----   Message from Gordy Levan, MD sent at 07/20/2016 10:34 AM EST ----- Regarding: PAC Need to keep PAC flushed ~ every 6-8 weeks, but hopefully patient will agree to have it removed by IR after she sees Dr Alycia Rossetti in ~ March or April If not to be removed, will need additional flushes and probably best to have med onc still involved. If PAC removed, gyn onc may want to follow her without scheduled med onc visits (could cancel med onc in July if so).   Scheduling request sent 07-20-16 as follows: "PAC flush 2 months PAC flush with Dr Elenora Gamma visit either in March or in April - I see she is at [facility] 4-25 if not here in March Mammograms and right breast US ~ 10-10-16 Medical oncology July + lab"  Thank you      Lennis

## 2016-10-14 ENCOUNTER — Ambulatory Visit (HOSPITAL_BASED_OUTPATIENT_CLINIC_OR_DEPARTMENT_OTHER): Payer: Medicare HMO

## 2016-10-14 DIAGNOSIS — Z452 Encounter for adjustment and management of vascular access device: Secondary | ICD-10-CM

## 2016-10-14 DIAGNOSIS — Z95828 Presence of other vascular implants and grafts: Secondary | ICD-10-CM

## 2016-10-14 DIAGNOSIS — C5701 Malignant neoplasm of right fallopian tube: Secondary | ICD-10-CM | POA: Diagnosis not present

## 2016-10-14 MED ORDER — SODIUM CHLORIDE 0.9% FLUSH
10.0000 mL | INTRAVENOUS | Status: DC | PRN
Start: 2016-10-14 — End: 2016-10-14
  Administered 2016-10-14: 10 mL via INTRAVENOUS
  Filled 2016-10-14: qty 10

## 2016-10-14 MED ORDER — HEPARIN SOD (PORK) LOCK FLUSH 100 UNIT/ML IV SOLN
500.0000 [IU] | Freq: Once | INTRAVENOUS | Status: AC | PRN
  Administered 2016-10-14: 500 [IU] via INTRAVENOUS
  Filled 2016-10-14: qty 5

## 2016-11-04 DIAGNOSIS — K439 Ventral hernia without obstruction or gangrene: Secondary | ICD-10-CM | POA: Diagnosis not present

## 2016-11-04 DIAGNOSIS — R5383 Other fatigue: Secondary | ICD-10-CM | POA: Diagnosis not present

## 2016-11-18 ENCOUNTER — Telehealth: Payer: Self-pay

## 2016-11-18 MED ORDER — ARMODAFINIL 250 MG PO TABS: 250.0000 mg | ORAL_TABLET | Freq: Every day | ORAL | 3 refills | Status: DC

## 2016-11-18 NOTE — Telephone Encounter (Signed)
Patient up to date on appts, Rx due, refill request received. Printed, waiting for signature and I will fax in once signed.

## 2016-11-19 ENCOUNTER — Telehealth: Payer: Self-pay | Admitting: Neurology

## 2016-11-19 DIAGNOSIS — F519 Sleep disorder not due to a substance or known physiological condition, unspecified: Secondary | ICD-10-CM

## 2016-11-19 DIAGNOSIS — F5109 Other insomnia not due to a substance or known physiological condition: Secondary | ICD-10-CM

## 2016-11-19 DIAGNOSIS — R0683 Snoring: Secondary | ICD-10-CM

## 2016-11-19 NOTE — Telephone Encounter (Signed)
She recently had a sleep study in 06/2016, what do you recommend?  Next appt is 07/2017

## 2016-11-19 NOTE — Telephone Encounter (Signed)
Pt called the office says she went on a retreat and her room mate said the pt woke her up snoring then coughing and almost choking. That happened 2-3 times during the night. It did not wake up the pt.  She also says she gained 20 pounds over the Christmas holiday ( within a 6 wk time) says she is working out and eating less but has only lost 5 pounds since January. She is concerned it could be the sleep apnea. Please call

## 2016-11-20 NOTE — Addendum Note (Signed)
Addended by: Larey Seat on: 11/20/2016 10:15 AM   Modules accepted: Orders

## 2016-11-20 NOTE — Telephone Encounter (Signed)
Spoke to Mrs, Revell. Happy to repeat study due to weight gain . This time we will do a HST , just a screening, essentially. She agrees with this plan and would like a call when she can pick up the device. CD

## 2016-12-01 NOTE — Progress Notes (Signed)
Consult Note: Gyn-Onc  Kristin Webb 71 y.o. female  CC:  Chief Complaint  Patient presents with  . Ovarian Cancer    HPI: Kristin Webb is a 71 year old, gravida 2 para 2, who noticed increasing abdominal swelling around the third week of January. Initially, the abdominal swelling was intermittent and would come and go. She related this to recent initiation of a calcium supplement. The abdominal swelling became more prevalent starting in February with abdominal pain reported also. In addition, she started having increasing constipation and formed stools, which was a change in her routine of 5-6 loose bowel movements a day chronically after her cholecystectomy. On November 01, 2012, she had a CT scan of the abdomen and pelvis which resulted no significant biliary dilation, no suspicious liver lesions, and the spleen, adrenal glands and pancreas appeared normal. 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 She has a large midabdominal mass measuring 17 x 22.7 cm x 20 cm cephalad, which was well circumscribed without calcifications. There is irregular thickened septations and areas of solid nodularity consistent with malignancy. There is no bowel obstruction and the mass appeared to be separate from the appendix. There was probable normal left ovarian tissue seen. The uterus is surgically absent. There is some soft tissue stranding at the base of the mesentery superior to the mass and some omental nodularity.   On November 08, 2012, she underwent an exploratory laparotomy, BSO, appendectomy, infracolic omentectomy, and optimal debulking. Operative findings included: 25 cm right adnexal mass with smooth surface. Surgically absent uterus. Atrophic-appearing left ovary. Normal appearing appendix. Within the omentum there were centimeter nodules scattered throughout the omentum. The remainder of the surfaces were benign. Final pathology  revealed:  1. 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 - BENIGN FALLOPIAN TUBE WITH NONSPECIFIC CHRONIC INFLAMMATION. 2. Ovary and fallopian tube, left - BENIGN OVARY; NEGATIVE FOR ATYPIA OR MALIGNANCY. - BENIGN FALLOPIAN TUBE; NEGATIVE FOR ATYPIA OR MALIGNANCY. 3. Omentum, resection for tumor - HIGH GRADE CARCINOMA, SEE COMMENT. 4. Appendix, Other than Incidental - FIBROUS OBLITERATION OF APPENDICEAL TIP. - NEGATIVE FOR MALIGNANCY.  She completed day 1, cycle 6 of 6 planned treatments of carboplatin and taxol on 03/28/13.   9/14 CT Findings: The liver is diffusely fatty infiltrated and measures 18.7 cm in cranial caudal length. No focal intrahepatic parenchymal abnormality. The spleen is unremarkable. The stomach, duodenum, pancreas and adrenal glands are unremarkable. The gallbladder is surgically absent. 11 x 9 x 11 mm nonobstructing stone is identified in the interpolar right kidney. Stable appearance of a 9 mm fatty lesion in the right kidney, likely a tiny angiomyolipoma. No abdominal aortic aneurysm. No free fluid in the abdomen. No abdominal lymphadenopathy. Haziness in the root of the small bowel mesentery is stable. Approximately 8 cm cranial to the umbilicus is an area of apparent focal midline fascial laxity. I cannot identify a discrete fascial defect to suggest an overt hernia. This is a wide-mouthed fascial bulge and some of the transverse colon projects out into this area of fascial bulging. No bowel wall thickening, adjacent edema, orfluid to suggest complication. Imaging through the pelvis shows no free intraperitoneal fluid. No pelvic sidewall lymphadenopathy. The uterus is surgically absent. The large complex cystic lesion seen in the right central abdomen and pelvis has  been resected in the interval. No substantial diverticular disease in  the colon. There is no colonic diverticulitis. Terminal ileum is normal. The appendix is not visualized, but there is no edema or inflammation in the region of the cecum. The right gonadal vein is enlarged and contains a central filling defect consistent with thrombus. Bone windows reveal no worrisome lytic or sclerotic osseous lesions.  IMPRESSION:  Interval resection of the large right pelvic and lower abdominal mass lesion with apparent omentectomy. No evidence for intraperitoneal free fluid on today's study. No discernible peritoneal lesions. 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  CT:11/16 IMPRESSION: 1. 12 mm obstructive calculus at the left ureteropelvic junction with moderate proximal hydronephrosis. 2. 2 small supraumbilical ventral hernias, one containing a shor segment of the mid transverse colon and the other containing a short segment of the mid small bowel. There is no associated evidence to suggest bowel incarceration or obstruction at this time. 3. Tiny locule of gas non dependently in the lumen of the urinary bladder. This is presumably iatrogenic related to recent catheterization for urinalysis. Alternatively, this could be seen in the setting of urinary tract infection with gas-forming organisms. Clinical correlation for history of recent catheterization is recommended. 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 5. Status post cholecystectomy. 6. Additional incidental findings, as above.  Interval History:  She was last seen by GYN oncology in 9/17. CA-125 at that time was normal at 7.8. She was seen by Dr. Marko Plume in 12/17 but I do not see a CA-125 from that day. She had a colonoscopy about 1-2 years ago. She is on GOG 225. She states that she hurt her side in December 2017 doing something at the [facility] she pulled a muscle on the right side of her back that then led to another back issues and back pain. She gained about 21 pounds secondary to lack of  exercise as well as eating more to modify and manage her pain. With that her fatigue increased. She did have a sleep study that showed no evidence of sleep apnea. She will return to retreat and her roommate that she did quite a bit of snoring and coughing. She has been in contact with her neurologist and they are considering repeating a sleep study secondary to her fairly significant weight gain. She is starting to work out again and working her weight back down to her baseline. She has noticed a bulge in her abdomen which popped up about 5 weeks ago. It is not tender. She feels that she states it feels "squishy". There are no other associated changes. She had her port flushed today.  Review of Systems: Constitutional:  She denies any fevers.  + fatigue, + weight gain Skin:  No rash Cardiovascular: No chest pain,  No SO, + cough Pulmonary: No cough Gastro Intestinal: No nausea, vomiting, constipation. No change in bowel movement. Bulge in abdomen Genitourinary: Denies vaginal bleeding and discharge.  Musculoskeletal: As above Neurologic: As above Psychology:  No changes, doing well.  Current Meds:  Outpatient Encounter Prescriptions as of 12/02/2016  Medication Sig  . Armodafinil (NUVIGIL) 250 MG tablet Take 1 tablet (250 mg total) by mouth daily. (Patient taking differently: Take 250 mg by mouth daily. Pt takes 1/2 to 1 tablet daily)  . colestipol (COLESTID) 1 g tablet Take by mouth daily as needed (For diarrhea.). Pt taking 1/2 to 1 tablet daily  . eszopiclone (LUNESTA) 2 MG TABS tablet Take 1 tablet (2 mg total)  by mouth at bedtime as needed for sleep. Take immediately before bedtime  . FLUoxetine (PROZAC) 10 MG capsule Take 10 mg by mouth daily. Takes with a 20mg  capsule for her total dose of 30mg .  Marland Kitchen FLUoxetine (PROZAC) 20 MG capsule Take 20 mg by mouth daily. Takes with a 10mg  capsule for her total dose of 30mg .  . ibuprofen (ADVIL,MOTRIN) 200 MG tablet Take 200 mg by mouth every 6 (six)  hours as needed for pain.  . Multiple Vitamin (MULTIVITAMIN WITH MINERALS) TABS Take 1 tablet by mouth daily.  Marland Kitchen nystatin (NYSTATIN) powder Apply to affected areas bid prn  . rosuvastatin (CRESTOR) 10 MG tablet Take 10 mg by mouth at bedtime.   . triamterene-hydrochlorothiazide (MAXZIDE-25) 37.5-25 MG tablet Take 1 tablet by mouth daily.  Marland Kitchen EPINEPHrine (EPIPEN 2-PAK) 0.3 mg/0.3 mL DEVI Inject 0.3 mg into the muscle once as needed (For bee stings.). Reported on 01/20/2016  . [DISCONTINUED] Calcium Carbonate-Vitamin D (CALCIUM-D PO) Take by mouth.  . [DISCONTINUED] diphenhydrAMINE (BENADRYL) 25 mg capsule Take 25 mg by mouth at bedtime.    No facility-administered encounter medications on file as of 12/02/2016.     Allergy:  Allergies  Allergen Reactions  . Ambien [Zolpidem Tartrate] Other (See Comments)    Sleep walking  . Bee Venom Hives    Difficulty breathing, carries an EPI-pen  . Cortisone Nausea And Vomiting, Swelling and Other (See Comments)    INJECTED CORTISONE ONLY, "sick to my stomach, throwing up, hand swelling, and pain."    Social Hx:   Social History   Social History  . Marital status: Widowed    Spouse name: Kristin Webb  . Number of children: 2  . Years of education: Master's   Occupational History  . Not on file.   Social History Main Topics  . Smoking status: Never Smoker  . Smokeless tobacco: Never Used  . Alcohol use No  . Drug use: No  . Sexual activity: Not on file   Other Topics Concern  . Not on file   Social History Narrative   Patient is married Kristin Webb) and lives at home with her husband.   Patient has 2 children by birth and 13 children all together.   Patient has a Oceanographer.   Patient is right-handed.   Patient drinks very little caffeine.             Past Surgical Hx:  Past Surgical History:  Procedure Laterality Date  . ABDOMINAL HYSTERECTOMY     early 14s  . APPENDECTOMY    . CHOLECYSTECTOMY     early 78s  . CYSTOSCOPY W/  URETERAL STENT PLACEMENT Left 07/09/2015   Procedure: CYSTOSCOPY WITH LEFT RETROGRADE PYELOGRAM/ LEFT URETERAL STENT PLACEMENT;  Surgeon: Ardis Hughs, MD;  Location: [facility];  Service: Urology;  Laterality: Left;  . EXPLORATORY LAPAROTOMY  11/08/12   BSO, appendectomy, omentectomy  . INCISIONAL HERNIA REPAIR N/A 12/25/2015   Procedure:  INCISIONAL HERNIA REPAIR;  Surgeon: Ralene Ok, MD;  Location: [facility];  Service: General;  Laterality: N/A;  . INSERTION OF MESH N/A 12/25/2015   Procedure: INSERTION OF MESH;  Surgeon: Ralene Ok, MD;  Location: [facility];  Service: General;  Laterality: N/A;  . LAPAROTOMY Bilateral 11/08/2012   Procedure: EXPLORATORY LAPAROTOMY TOTAL ABDOMINAL HYSTERECTOMY BILATERAL SALPINGO OOPHORECTOMY TUMOR DEBULKING ;  Surgeon: Imagene Gurney A. Alycia Rossetti, MD;  Location: [facility];  Service: Gynecology;  Laterality: Bilateral;  APPENDECTOMY / OMENTECTOMY  . LAPAROTOMY N/A 12/25/2015   Procedure: EXPLORATORY LAPAROTOMY;  Surgeon: Ralene Ok, MD;  Location: [facility];  Service: General;  Laterality: N/A;  . LYSIS OF ADHESION N/A 12/25/2015   Procedure: LYSIS OF ADHESION;  Surgeon: Ralene Ok, MD;  Location: [facility];  Service: General;  Laterality: N/A;  . TRIGGER FINGER RELEASE      Past Medical Hx:  Past Medical History:  Diagnosis Date  . Burning mouth syndrome   . Chronic fatigue   . Constipation   . Diarrhea    in the past after gallbladder removal  . Fibromyalgia   . GERD (gastroesophageal reflux disease)   . History of kidney stones 07/2015  . Hyperlipidemia   . Hypertension    borderline not on meds   . Lumbar disc disease   . Ovarian cancer ([place]) 2014  . Pelvic mass in female   . Pneumonia    hx of pneumonia as an infant  . PONV (postoperative nausea and vomiting)   . Shortness of breath    with exertion   . Sleep apnea    ;neurologist informed pt she no longer needed her CPAP machine  . Yeast infection     Oncology Hx:  Oncology History   IIIB  serous carcinoma of right fallopian tube, treated with optimal debulking 11-08-2012 and adjuvant chemotherapy     Ovarian CA, right ([place])   11/01/2012 Initial Diagnosis    Ovarian ca      11/08/2012 Surgery    IA mucious LMP, IIIB serous FT carcinoma       - 03/28/2013 Chemotherapy    Completed cycle #6 of paclitaxel and carboplatin. negatve post treatmen CT 9/14       Fallopian tube carcinoma ([employer])   11/01/2012 Initial Diagnosis    Fallopian tube carcinoma      11/08/2012 Surgery    Debulking, optima. IA ovarian and IIIB fallopian tube      12/13/2012 - 03/28/2013 Chemotherapy    s/p 6 cycles of paclitaxel and carboplatin       Family Hx:  Family History  Problem Relation Age of Onset  . Lung cancer Mother 29  . High blood pressure Mother   . High Cholesterol Mother   . Lung cancer Father 37    2 ppd smoker  . Parkinson's disease Father   . Kidney disease      Vitals:  Blood pressure (!) 158/98, pulse 91, temperature 98.3 F (36.8 C), temperature source Oral, resp. rate 18, weight 193 lb 8 oz (87.8 kg).  Physical Exam: General: Well developed, well nourished female in no acute distress. Alert and oriented x 3.   Head/Neck:  Supple without any enlargements.   Lymph node survey: No cervical, supraclavicular, or inguinal adenopathy.   Cardiovascular: Regular rate and rhythm.   Lungs: Clear to auscultation bilaterally.   Abdomen: Abdomen soft, non-tender and obese. Active bowel sounds in all quadrants. No evidence of a fluid wave. Midline abdominal incision well healed. Palpable hernia superior to her vertical midline incision. It is easily reducible. The opening measures approximately 3-1/2-4 cm. His most likely transverse colon going up into the hernia.  No rebound or guarding. No hepatosplenomegaly. No skin changes under the pannus.  Groins: No lymphadenopathy  Genitourinary:  Vulva/vagina: Normal external female genitalia. No lesions.  Urethra: No lesions or  masses.  Vagina: No masses or nodularity Rectal: Good tone, no palpable masses or nodularity.   Extremities: No bilateral cyanosis, edema, or clubbing.   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 She was completely resected to an R0 in 4/14. She's completed her chemotherapy and had a post treatment CT scan that was negative in 9/14 and most recently in 11/16. Her last CA 125 was normal. It has never been elevated.  She has no evidence of recurrent disease.  Her port was flushed today and a CA-125 was drawn as well as the creatinine. Abdomen she has a new hernia and we will get a CT scan to evaluate the hernia as well as rule out any evidence of intra-abdominal disease leading to the hernia. She does all call her with the results of your blood work as well as your CT scan today. As she is 4 years out from the time of her treatment she can return to see [place] in 6 months. She at this time is still uncertain whether or not she wants to keep her port or have it removed. At this time she will keep it. I will be something that we will discuss moving forward.  Per protocol she will follow up with Dr. Marko Plume in 3 months as scheduled and return to see me in approximately the 6 months. We will notify her of the results of her CA 25 from today. I called and nystatin to her pharmacy. She will follow-up with her neurologist regarding her increased fatigue and concerns or sleep apnea may be an issue now. She will follow-up with her primary physician regarding her left shoulder pain that does not like it could be consistent with a rotator cuff injury.  Kristin Webb A., MD 12/02/2016, 11:33 AM

## 2016-12-02 ENCOUNTER — Ambulatory Visit (HOSPITAL_BASED_OUTPATIENT_CLINIC_OR_DEPARTMENT_OTHER): Payer: Medicare HMO

## 2016-12-02 ENCOUNTER — Encounter: Payer: Self-pay | Admitting: Gynecologic Oncology

## 2016-12-02 ENCOUNTER — Other Ambulatory Visit: Payer: Self-pay | Admitting: Gynecologic Oncology

## 2016-12-02 ENCOUNTER — Ambulatory Visit: Payer: Medicare HMO | Attending: Gynecologic Oncology | Admitting: Gynecologic Oncology

## 2016-12-02 VITALS — BP 158/98 | HR 91 | Temp 98.3°F | Resp 18 | Wt 193.5 lb

## 2016-12-02 DIAGNOSIS — Z95828 Presence of other vascular implants and grafts: Secondary | ICD-10-CM

## 2016-12-02 DIAGNOSIS — C569 Malignant neoplasm of unspecified ovary: Secondary | ICD-10-CM | POA: Diagnosis not present

## 2016-12-02 DIAGNOSIS — Z79899 Other long term (current) drug therapy: Secondary | ICD-10-CM | POA: Diagnosis not present

## 2016-12-02 DIAGNOSIS — K469 Unspecified abdominal hernia without obstruction or gangrene: Secondary | ICD-10-CM

## 2016-12-02 DIAGNOSIS — C561 Malignant neoplasm of right ovary: Secondary | ICD-10-CM | POA: Insufficient documentation

## 2016-12-02 DIAGNOSIS — C562 Malignant neoplasm of left ovary: Secondary | ICD-10-CM

## 2016-12-02 DIAGNOSIS — Z8543 Personal history of malignant neoplasm of ovary: Secondary | ICD-10-CM

## 2016-12-02 DIAGNOSIS — C5701 Malignant neoplasm of right fallopian tube: Secondary | ICD-10-CM | POA: Diagnosis not present

## 2016-12-02 LAB — COMPREHENSIVE METABOLIC PANEL
ALT: 27 U/L (ref 0–55)
AST: 21 U/L (ref 5–34)
Albumin: 4.2 g/dL (ref 3.5–5.0)
Alkaline Phosphatase: 104 U/L (ref 40–150)
Anion Gap: 11 mEq/L (ref 3–11)
BUN: 15.3 mg/dL (ref 7.0–26.0)
CO2: 27 mEq/L (ref 22–29)
Calcium: 10.1 mg/dL (ref 8.4–10.4)
Chloride: 102 mEq/L (ref 98–109)
Creatinine: 0.7 mg/dL (ref 0.6–1.1)
EGFR: 83 mL/min/{1.73_m2} — ABNORMAL LOW (ref >90)
Glucose: 98 mg/dl (ref 70–140)
Potassium: 3.7 mEq/L (ref 3.5–5.1)
Sodium: 141 mEq/L (ref 136–145)
Total Bilirubin: 0.37 mg/dL (ref 0.20–1.20)
Total Protein: 7.1 g/dL (ref 6.4–8.3)

## 2016-12-02 MED ORDER — SODIUM CHLORIDE 0.9% FLUSH
10.0000 mL | INTRAVENOUS | Status: DC | PRN
Start: 2016-12-02 — End: 2016-12-02
  Administered 2016-12-02: 10 mL via INTRAVENOUS
  Filled 2016-12-02: qty 10

## 2016-12-02 MED ORDER — HEPARIN SOD (PORK) LOCK FLUSH 100 UNIT/ML IV SOLN
500.0000 [IU] | Freq: Once | INTRAVENOUS | Status: AC | PRN
  Administered 2016-12-02: 500 [IU] via INTRAVENOUS
  Filled 2016-12-02: qty 5

## 2016-12-02 NOTE — Patient Instructions (Addendum)
We will follow-up with the results of her CA-125 from today. Please return to see [place] in 6 months.  I will call you with the results of your CT scan.

## 2016-12-03 LAB — CA 125: Cancer Antigen (CA) 125: 8 U/mL (ref 0.0–38.1)

## 2016-12-04 ENCOUNTER — Telehealth: Payer: Self-pay

## 2016-12-04 NOTE — Telephone Encounter (Signed)
Told Ms Mahan that her CA-125  WNL at 8.0. Pt set up for CT A/P  At 0900 on 12-11-16 at [facility].

## 2016-12-07 ENCOUNTER — Telehealth: Payer: Self-pay | Admitting: *Deleted

## 2016-12-07 NOTE — Telephone Encounter (Signed)
Open in error

## 2016-12-11 ENCOUNTER — Ambulatory Visit (HOSPITAL_COMMUNITY)
Admission: RE | Admit: 2016-12-11 | Discharge: 2016-12-11 | Disposition: A | Discharge status: Home / Self Care | Payer: Medicare HMO | Source: Ambulatory Visit | Attending: Gynecologic Oncology | Admitting: Gynecologic Oncology

## 2016-12-11 ENCOUNTER — Telehealth: Payer: Self-pay

## 2016-12-11 ENCOUNTER — Encounter (HOSPITAL_COMMUNITY): Payer: Self-pay

## 2016-12-11 DIAGNOSIS — C569 Malignant neoplasm of unspecified ovary: Secondary | ICD-10-CM | POA: Diagnosis not present

## 2016-12-11 DIAGNOSIS — I700 Atherosclerosis of aorta: Secondary | ICD-10-CM | POA: Insufficient documentation

## 2016-12-11 DIAGNOSIS — Z90710 Acquired absence of both cervix and uterus: Secondary | ICD-10-CM | POA: Insufficient documentation

## 2016-12-11 DIAGNOSIS — Z9049 Acquired absence of other specified parts of digestive tract: Secondary | ICD-10-CM | POA: Insufficient documentation

## 2016-12-11 DIAGNOSIS — C561 Malignant neoplasm of right ovary: Secondary | ICD-10-CM

## 2016-12-11 DIAGNOSIS — K439 Ventral hernia without obstruction or gangrene: Secondary | ICD-10-CM | POA: Insufficient documentation

## 2016-12-11 DIAGNOSIS — M47816 Spondylosis without myelopathy or radiculopathy, lumbar region: Secondary | ICD-10-CM | POA: Diagnosis not present

## 2016-12-11 DIAGNOSIS — R109 Unspecified abdominal pain: Secondary | ICD-10-CM | POA: Diagnosis not present

## 2016-12-11 MED ORDER — IOPAMIDOL (ISOVUE-300) INJECTION 61%
INTRAVENOUS | Status: AC
  Filled 2016-12-11: qty 100

## 2016-12-11 MED ORDER — IOPAMIDOL (ISOVUE-300) INJECTION 61%
100.0000 mL | Freq: Once | INTRAVENOUS | Status: AC | PRN
  Administered 2016-12-11: 100 mL via INTRAVENOUS

## 2016-12-11 NOTE — Telephone Encounter (Signed)
LM to call back for results of the CT scan.

## 2016-12-14 NOTE — Telephone Encounter (Signed)
Routed last office note, lab and Ct scan from 4-25 and 12-11-16 to Dr. Theadore Nan as requested by patient.

## 2016-12-14 NOTE — Telephone Encounter (Signed)
Told Kristin McNamarathat the CT scan was fine per Joylene John, NP. Scheduled CA-125 with PAC flush on 02-24-17 for visit with [name] on 03-03-17 since pt not currently seen by a medical oncologist per [facility]. Pt verbalized understanding.

## 2016-12-28 ENCOUNTER — Ambulatory Visit (INDEPENDENT_AMBULATORY_CARE_PROVIDER_SITE_OTHER): Payer: Commercial Managed Care - HMO | Admitting: Neurology

## 2016-12-28 DIAGNOSIS — R0683 Snoring: Secondary | ICD-10-CM

## 2016-12-28 DIAGNOSIS — F5109 Other insomnia not due to a substance or known physiological condition: Secondary | ICD-10-CM

## 2016-12-28 DIAGNOSIS — G4733 Obstructive sleep apnea (adult) (pediatric): Secondary | ICD-10-CM

## 2016-12-28 DIAGNOSIS — F519 Sleep disorder not due to a substance or known physiological condition, unspecified: Secondary | ICD-10-CM

## 2016-12-30 ENCOUNTER — Other Ambulatory Visit: Payer: Self-pay | Admitting: Gynecologic Oncology

## 2016-12-30 DIAGNOSIS — B3749 Other urogenital candidiasis: Secondary | ICD-10-CM

## 2017-01-04 NOTE — Procedures (Signed)
NAME: Gracelee Stemmler     DOB: Jun 15, 1946 MEDICAL RECORD [id number]   DOS: 12/28/16 REFERRING PHYSICIAN: Cari Caraway ,MD Study performed: HST HISTORY: Thurza Kwiecinski is a female patient, with history of OSA on CPAP, cancer and weight gain -and persistent fatigue, hypersomnia. She is seen here for CPAP follow up- She has been using Nuvigil as tolerated which has given her improvement in life quality and energy. The last CPAP download was on 05-06-12, with the 95% percentile pressure of 12 cm water, the residual AHI of 0.5/hr. and an average of 5.9 hours of night-user time for CPAP.  She has endorsed the Epworth score at 10 points, fatigue severity 45 points, geriatric depression score 2 out of 15. BMI was 34.5.   STUDY RESULTS: Total Recording Time: 8h 5 m with the Total Apnea/Hypopnea Index (AHI): 9.1/hr. and RDI (Snoring index was 12.1/hr., desaturation index was 11.9 /hr.  Average Oxygen Saturation: SpO2 93%, Nadir SpO2 (Lowest Oxygen Saturation): 85%, without clinically significant time in hypoxemia (7 minutes at or under 88% ). Average Heart Rate: 71 bpm  IMPRESSION:  HST does not reveal evidence of other than mild obstructive sleep apnea, neither hypoxemia or tachy- brady  arrhythmia.  I cannot see a correlation of sleep physiology and excessive fatigue.  I certify that I have reviewed the raw data recording prior to the issuance of this report in accordance with the standards of Accreditation of the American Academy of Sleep medicine (AASM) Larey Seat, MD    01-04-2017       Diplomat, American Board of Psychiatry and Neurology and Diplomat, American Board of Sleep Medicine, Market researcher of [employer] at [employer]

## 2017-01-05 ENCOUNTER — Telehealth: Payer: Self-pay

## 2017-01-05 DIAGNOSIS — G4733 Obstructive sleep apnea (adult) (pediatric): Secondary | ICD-10-CM

## 2017-01-05 DIAGNOSIS — C801 Malignant (primary) neoplasm, unspecified: Secondary | ICD-10-CM

## 2017-01-05 DIAGNOSIS — D649 Anemia, unspecified: Secondary | ICD-10-CM

## 2017-01-05 NOTE — Telephone Encounter (Signed)
I called pt to discuss pt's sleep study results. No answer, left a message asking her to call me back.

## 2017-01-05 NOTE — Telephone Encounter (Signed)
-----   Message from Larey Seat, MD sent at 01/04/2017  4:34 PM EDT ----- Borderline AHi  Of 9.1 , RDI of 12.1 - CPAP continuity would be recommended iin a patient with hypersomnia.

## 2017-01-06 NOTE — Telephone Encounter (Signed)
I called pt again, no answer, left a message at both home and cell number asking her to call me back.

## 2017-01-11 NOTE — Telephone Encounter (Signed)
Patient agreed to auto titration trial. 14 days 5-12 cm water CPAP pressure. Mask of choice.

## 2017-01-11 NOTE — Telephone Encounter (Signed)
Order for auto pap trial sent to [facility].

## 2017-01-11 NOTE — Telephone Encounter (Signed)
I called pt again to discuss sleep study results. No answer at either home nor cell number. This is my third unsuccessful attempt at reaching this pt by phone. I will send her a letter in the mail asking her to call me back.

## 2017-01-11 NOTE — Addendum Note (Signed)
Addended by: Larey Seat on: 01/11/2017 04:26 PM   Modules accepted: Orders

## 2017-01-11 NOTE — Telephone Encounter (Signed)
Pt returned my call. I advised her that her sleep study revealed borderline osa, but since pt has hypersomnia, Dr. Brett Fairy does recommend that pt continue her cpap. Pt says that pt has not used cpap in 4-5 years and no longer has this cpap. She used [facility]. I advised pt that Dr. Brett Fairy usually considers a cpap titration study or auto pap for her osa patients and that since pt no longer has her cpap, I will send this to Dr. Brett Fairy to review for the best option for this pt. Pt verbalized understanding of results. Pt had no questions at this time but was encouraged to call back if questions arise.

## 2017-01-13 ENCOUNTER — Ambulatory Visit (HOSPITAL_BASED_OUTPATIENT_CLINIC_OR_DEPARTMENT_OTHER): Payer: Medicare HMO

## 2017-01-13 DIAGNOSIS — Z452 Encounter for adjustment and management of vascular access device: Secondary | ICD-10-CM

## 2017-01-13 DIAGNOSIS — C5701 Malignant neoplasm of right fallopian tube: Secondary | ICD-10-CM | POA: Diagnosis not present

## 2017-01-13 DIAGNOSIS — Z95828 Presence of other vascular implants and grafts: Secondary | ICD-10-CM

## 2017-01-13 MED ORDER — SODIUM CHLORIDE 0.9% FLUSH
10.0000 mL | INTRAVENOUS | Status: DC | PRN
  Administered 2017-01-13: 10 mL via INTRAVENOUS
  Filled 2017-01-13: qty 10

## 2017-01-13 MED ORDER — HEPARIN SOD (PORK) LOCK FLUSH 100 UNIT/ML IV SOLN
500.0000 [IU] | Freq: Once | INTRAVENOUS | Status: AC | PRN
  Administered 2017-01-13: 500 [IU] via INTRAVENOUS
  Filled 2017-01-13: qty 5

## 2017-01-21 NOTE — Telephone Encounter (Signed)
I called pt and advised her that Dr. Brett Fairy recommended a mask of choice for her, so whatever mask she is most comfortable with should be fine. Pt verbalized understanding.

## 2017-01-21 NOTE — Telephone Encounter (Signed)
Patient called office in reference to picking up auto pap from [facility] per patient she was given a full mask and thought she was to get the nasal one.  Patient would like to make sure this is okay she used the machine last night with no problems of the full mask.  Please call

## 2017-01-30 DIAGNOSIS — H1031 Unspecified acute conjunctivitis, right eye: Secondary | ICD-10-CM | POA: Diagnosis not present

## 2017-02-08 ENCOUNTER — Other Ambulatory Visit: Payer: Self-pay

## 2017-02-08 ENCOUNTER — Other Ambulatory Visit: Payer: Self-pay | Admitting: Gynecologic Oncology

## 2017-02-08 DIAGNOSIS — N63 Unspecified lump in unspecified breast: Secondary | ICD-10-CM

## 2017-02-09 ENCOUNTER — Telehealth: Payer: Self-pay | Admitting: Neurology

## 2017-02-09 DIAGNOSIS — Z8543 Personal history of malignant neoplasm of ovary: Secondary | ICD-10-CM

## 2017-02-09 DIAGNOSIS — Z9989 Dependence on other enabling machines and devices: Principal | ICD-10-CM

## 2017-02-09 DIAGNOSIS — G4733 Obstructive sleep apnea (adult) (pediatric): Secondary | ICD-10-CM

## 2017-02-09 NOTE — Telephone Encounter (Signed)
Third of July 2018,   I have the pleasure of treating a compliance report for CPAP use for my patient Kristin Webb covering the last 14 days. The patient has been 100% compliant with CPAP use on average 8 hours and 25 minutes nightly. AutoSet is covering a pressure window between 5 and 15 cm water pressure with 2 cm expiratory pressure relief. The residual AHI is 1.7. The 95th percentile pressure is 13.7 and well covered with in the pressure window.   I spoke to the patient and she is excited to keep her auto-titration machine.  If she is not qualified to use a new machine yet, , I would reset her current CPAP to 14 cm water.  Larey Seat, MD

## 2017-02-11 ENCOUNTER — Telehealth: Payer: Self-pay | Admitting: Neurology

## 2017-02-11 NOTE — Telephone Encounter (Signed)
Sent message to [facility] to check order

## 2017-02-11 NOTE — Telephone Encounter (Signed)
Called pt to set up a appt for her since she is receiving new equipment her insurance requires her to have a follow up apt. Pt agreed to that and we have scheduled her follow up for sept 26 at 10:00 am. Pt verbalized understanding

## 2017-02-15 DIAGNOSIS — G4733 Obstructive sleep apnea (adult) (pediatric): Secondary | ICD-10-CM | POA: Diagnosis not present

## 2017-02-18 ENCOUNTER — Other Ambulatory Visit: Payer: Self-pay | Admitting: Gynecologic Oncology

## 2017-02-18 ENCOUNTER — Ambulatory Visit
Admission: RE | Admit: 2017-02-18 | Discharge: 2017-02-18 | Disposition: A | Discharge status: Home / Self Care | Payer: Medicare HMO | Source: Ambulatory Visit | Attending: Gynecologic Oncology | Admitting: Gynecologic Oncology

## 2017-02-18 DIAGNOSIS — N63 Unspecified lump in unspecified breast: Secondary | ICD-10-CM

## 2017-02-18 DIAGNOSIS — N6489 Other specified disorders of breast: Secondary | ICD-10-CM | POA: Diagnosis not present

## 2017-02-18 DIAGNOSIS — R922 Inconclusive mammogram: Secondary | ICD-10-CM | POA: Diagnosis not present

## 2017-02-24 ENCOUNTER — Ambulatory Visit (HOSPITAL_BASED_OUTPATIENT_CLINIC_OR_DEPARTMENT_OTHER): Payer: Medicare HMO

## 2017-02-24 ENCOUNTER — Other Ambulatory Visit: Payer: Medicare HMO

## 2017-02-24 DIAGNOSIS — Z95828 Presence of other vascular implants and grafts: Secondary | ICD-10-CM

## 2017-02-24 DIAGNOSIS — C561 Malignant neoplasm of right ovary: Secondary | ICD-10-CM | POA: Diagnosis not present

## 2017-02-24 DIAGNOSIS — C5701 Malignant neoplasm of right fallopian tube: Secondary | ICD-10-CM | POA: Diagnosis not present

## 2017-02-24 DIAGNOSIS — Z452 Encounter for adjustment and management of vascular access device: Secondary | ICD-10-CM

## 2017-02-24 MED ORDER — SODIUM CHLORIDE 0.9% FLUSH
10.0000 mL | INTRAVENOUS | Status: DC | PRN
  Administered 2017-02-24: 10 mL via INTRAVENOUS
  Filled 2017-02-24: qty 10

## 2017-02-24 MED ORDER — HEPARIN SOD (PORK) LOCK FLUSH 100 UNIT/ML IV SOLN
500.0000 [IU] | Freq: Once | INTRAVENOUS | Status: AC | PRN
  Administered 2017-02-24: 500 [IU] via INTRAVENOUS
  Filled 2017-02-24: qty 5

## 2017-02-25 LAB — CA 125: Cancer Antigen (CA) 125: 9.3 U/mL (ref 0.0–38.1)

## 2017-03-03 ENCOUNTER — Encounter: Payer: Self-pay | Admitting: Gynecologic Oncology

## 2017-03-03 ENCOUNTER — Ambulatory Visit: Payer: Medicare HMO | Attending: Gynecologic Oncology | Admitting: Gynecologic Oncology

## 2017-03-03 VITALS — BP 142/77 | HR 71 | Temp 97.7°F | Resp 18 | Ht 60.0 in | Wt 196.8 lb

## 2017-03-03 DIAGNOSIS — Z87442 Personal history of urinary calculi: Secondary | ICD-10-CM | POA: Insufficient documentation

## 2017-03-03 DIAGNOSIS — K219 Gastro-esophageal reflux disease without esophagitis: Secondary | ICD-10-CM | POA: Insufficient documentation

## 2017-03-03 DIAGNOSIS — B372 Candidiasis of skin and nail: Secondary | ICD-10-CM

## 2017-03-03 DIAGNOSIS — Z888 Allergy status to other drugs, medicaments and biological substances status: Secondary | ICD-10-CM | POA: Insufficient documentation

## 2017-03-03 DIAGNOSIS — Z90722 Acquired absence of ovaries, bilateral: Secondary | ICD-10-CM | POA: Insufficient documentation

## 2017-03-03 DIAGNOSIS — Z8544 Personal history of malignant neoplasm of other female genital organs: Secondary | ICD-10-CM | POA: Diagnosis not present

## 2017-03-03 DIAGNOSIS — E785 Hyperlipidemia, unspecified: Secondary | ICD-10-CM | POA: Diagnosis not present

## 2017-03-03 DIAGNOSIS — Z801 Family history of malignant neoplasm of trachea, bronchus and lung: Secondary | ICD-10-CM | POA: Diagnosis not present

## 2017-03-03 DIAGNOSIS — Z8742 Personal history of other diseases of the female genital tract: Secondary | ICD-10-CM | POA: Diagnosis not present

## 2017-03-03 DIAGNOSIS — Z8249 Family history of ischemic heart disease and other diseases of the circulatory system: Secondary | ICD-10-CM | POA: Diagnosis not present

## 2017-03-03 DIAGNOSIS — G4730 Sleep apnea, unspecified: Secondary | ICD-10-CM | POA: Diagnosis not present

## 2017-03-03 DIAGNOSIS — C561 Malignant neoplasm of right ovary: Secondary | ICD-10-CM

## 2017-03-03 DIAGNOSIS — Z8589 Personal history of malignant neoplasm of other organs and systems: Secondary | ICD-10-CM | POA: Insufficient documentation

## 2017-03-03 DIAGNOSIS — Z9221 Personal history of antineoplastic chemotherapy: Secondary | ICD-10-CM | POA: Insufficient documentation

## 2017-03-03 DIAGNOSIS — G4733 Obstructive sleep apnea (adult) (pediatric): Secondary | ICD-10-CM | POA: Diagnosis not present

## 2017-03-03 DIAGNOSIS — H04123 Dry eye syndrome of bilateral lacrimal glands: Secondary | ICD-10-CM | POA: Diagnosis not present

## 2017-03-03 DIAGNOSIS — Z8543 Personal history of malignant neoplasm of ovary: Secondary | ICD-10-CM | POA: Diagnosis not present

## 2017-03-03 DIAGNOSIS — M797 Fibromyalgia: Secondary | ICD-10-CM | POA: Diagnosis not present

## 2017-03-03 DIAGNOSIS — Z79899 Other long term (current) drug therapy: Secondary | ICD-10-CM | POA: Insufficient documentation

## 2017-03-03 DIAGNOSIS — I10 Essential (primary) hypertension: Secondary | ICD-10-CM | POA: Diagnosis not present

## 2017-03-03 DIAGNOSIS — Z90710 Acquired absence of both cervix and uterus: Secondary | ICD-10-CM | POA: Diagnosis not present

## 2017-03-03 DIAGNOSIS — K439 Ventral hernia without obstruction or gangrene: Secondary | ICD-10-CM | POA: Insufficient documentation

## 2017-03-03 DIAGNOSIS — Z08 Encounter for follow-up examination after completed treatment for malignant neoplasm: Secondary | ICD-10-CM | POA: Insufficient documentation

## 2017-03-03 NOTE — Progress Notes (Signed)
Consult Note: Gyn-Onc  Kristin Webb 71 y.o. female  CC:  Chief Complaint  Patient presents with  . Ovarian CA, right ([place])    HPI: Kristin Webb is a 71 year old, gravida 2 para 2, who noticed increasing abdominal swelling around the third week of January. Initially, the abdominal swelling was intermittent and would come and go. She related this to recent initiation of a calcium supplement. The abdominal swelling became more prevalent starting in February with abdominal pain reported also. In addition, she started having increasing constipation and formed stools, which was a change in her routine of 5-6 loose bowel movements a day chronically after her cholecystectomy. On November 01, 2012, she had a CT scan of the abdomen and pelvis which resulted no significant biliary dilation, no suspicious liver lesions, and the spleen, adrenal glands and pancreas appeared normal. 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 She has a large midabdominal mass measuring 17 x 22.7 cm x 20 cm cephalad, which was well circumscribed without calcifications. There is irregular thickened septations and areas of solid nodularity consistent with malignancy. There is no bowel obstruction and the mass appeared to be separate from the appendix. There was probable normal left ovarian tissue seen. The uterus is surgically absent. There is some soft tissue stranding at the base of the mesentery superior to the mass and some omental nodularity.   On November 08, 2012, she underwent an exploratory laparotomy, BSO, appendectomy, infracolic omentectomy, and optimal debulking. Operative findings included: 25 cm right adnexal mass with smooth surface. Surgically absent uterus. Atrophic-appearing left ovary. Normal appearing appendix. Within the omentum there were centimeter nodules scattered throughout the omentum. The remainder of the surfaces were benign. Final pathology  revealed:  1. 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 - BENIGN FALLOPIAN TUBE WITH NONSPECIFIC CHRONIC INFLAMMATION. 2. Ovary and fallopian tube, left - BENIGN OVARY; NEGATIVE FOR ATYPIA OR MALIGNANCY. - BENIGN FALLOPIAN TUBE; NEGATIVE FOR ATYPIA OR MALIGNANCY. 3. Omentum, resection for tumor - HIGH GRADE CARCINOMA, SEE COMMENT. 4. Appendix, Other than Incidental - FIBROUS OBLITERATION OF APPENDICEAL TIP. - NEGATIVE FOR MALIGNANCY.  She completed day 1, cycle 6 of 6 planned treatments of carboplatin and taxol on 03/28/13.   9/14 CT Findings: The liver is diffusely fatty infiltrated and measures 18.7 cm in cranial caudal length. No focal intrahepatic parenchymal abnormality. The spleen is unremarkable. The stomach, duodenum, pancreas and adrenal glands are unremarkable. The gallbladder is surgically absent. 11 x 9 x 11 mm nonobstructing stone is identified in the interpolar right kidney. Stable appearance of a 9 mm fatty lesion in the right kidney, likely a tiny angiomyolipoma. No abdominal aortic aneurysm. No free fluid in the abdomen. No abdominal lymphadenopathy. Haziness in the root of the small bowel mesentery is stable. Approximately 8 cm cranial to the umbilicus is an area of apparent focal midline fascial laxity. I cannot identify a discrete fascial defect to suggest an overt hernia. This is a wide-mouthed fascial bulge and some of the transverse colon projects out into this area of fascial bulging. No bowel wall thickening, adjacent edema, orfluid to suggest complication. Imaging through the pelvis shows no free intraperitoneal fluid. No pelvic sidewall lymphadenopathy. The uterus is surgically absent. The large complex cystic lesion seen in the right central abdomen and  pelvis has been resected in the interval. No substantial diverticular disease in  the colon. There is no colonic diverticulitis. Terminal ileum is normal. The appendix is not visualized, but there is no edema or inflammation in the region of the cecum. The right gonadal vein is enlarged and contains a central filling defect consistent with thrombus. Bone windows reveal no worrisome lytic or sclerotic osseous lesions.  IMPRESSION:  Interval resection of the large right pelvic and lower abdominal mass lesion with apparent omentectomy. No evidence for intraperitoneal free fluid on today's study. No discernible peritoneal lesions. 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  CT:11/16 IMPRESSION: 1. 12 mm obstructive calculus at the left ureteropelvic junction with moderate proximal hydronephrosis. 2. 2 small supraumbilical ventral hernias, one containing a shor segment of the mid transverse colon and the other containing a short segment of the mid small bowel. There is no associated evidence to suggest bowel incarceration or obstruction at this time. 3. Tiny locule of gas non dependently in the lumen of the urinary bladder. This is presumably iatrogenic related to recent catheterization for urinalysis. Alternatively, this could be seen in the setting of urinary tract infection with gas-forming organisms. Clinical correlation for history of recent catheterization is recommended. 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 5. Status post cholecystectomy. 6. Additional incidental findings, as above.  Interval History:  I last saw her in April. At that time her hernia was identified. She underwent a CT scan that revealed: IMPRESSION: 1. No evidence of metastatic ovarian cancer. 2. Recurrent subxiphoid ventral abdominal wall hernia containing transverse colon. No evidence of incarceration or obstruction. 3. Stable incidental findings in the liver and kidneys. No recurrent urinary tract calculus. 4. Progressive lower lumbar spondylosis. 5.  Aortic Atherosclerosis  (ICD10-I70.0).  Her CA-125 was normal at 8. She came in last week for port flush and her CA-125 was 9.3 at that time. She is overall doing quite well. We did discuss her CT results and asked her weight is been going up she really wants to defer hernia repair at this time. She states her weight is, because she's had more fatigue. She has been to see her neurologist and they repeated her sleep study which was felt to be borderline but she is back on her C Pap at night. Is a little bit too early for her to tolerate its improving. She recently did have conjunctivitis and is following up with her primary care physician today. She's had lab work done with both her primary care physician and her rheumatologist and there is no clear answer for her increasing fatigue. She is going and to go back to the [facility] and start doing her yoga and tai chi. She's really been rather sedentary because of fatigue is really been eating quite a bit more and making poor food choices. She occasionally feels a little bit dizzy but otherwise is completely negative review of systems. She has follow-up as stated above today with her primary care physician is seeing her neurologist again in September. She otherwise offers no complaints and does not feel that the hernia has gotten any bigger. She is really otherwise fairly asymptomatic with it other than the appearance.  Review of Systems: Constitutional:  She denies any fevers.  + fatigue, + weight gain Skin:  No rash Cardiovascular: No chest pain,  SOB or cought Pulmonary: No cough Gastro Intestinal: No nausea, vomiting, constipation. No change in bowel movement. Bulge in abdomen Genitourinary: Denies vaginal bleeding and discharge.  Musculoskeletal: As above Neurologic: As above Psychology:  No changes, doing well.  Current Meds:  Outpatient Encounter Prescriptions as of 03/03/2017  Medication Sig  . Armodafinil (NUVIGIL) 250 MG tablet Take 1 tablet (250 mg total) by mouth daily.  (Patient taking differently: Take 250 mg by mouth daily. Pt takes 1/2 to 1 tablet daily)  . colestipol (COLESTID) 1 g tablet Take by mouth daily as needed (For diarrhea.). Pt taking 1/2 to 1 tablet daily  . EPINEPHrine (EPIPEN 2-PAK) 0.3 mg/0.3 mL DEVI Inject 0.3 mg into the muscle once as needed (For bee stings.). Reported on 01/20/2016  . eszopiclone (LUNESTA) 2 MG TABS tablet Take 1 tablet (2 mg total) by mouth at bedtime as needed for sleep. Take immediately before bedtime  . FLUoxetine (PROZAC) 10 MG capsule Take 10 mg by mouth daily. Takes with a 72m capsule for her total dose of 327m  . Marland KitchenLUoxetine (PROZAC) 20 MG capsule Take 20 mg by mouth daily. Takes with a 1063mapsule for her total dose of 25m65m. ibMarland Kitchenprofen (ADVIL,MOTRIN) 200 MG tablet Take 200 mg by mouth every 6 (six) hours as needed for pain.  . Multiple Vitamin (MULTIVITAMIN WITH MINERALS) TABS Take 1 tablet by mouth daily.  . nyMarland Kitchentatin (NYSTATIN) powder Apply to affected areas bid prn  . nystatin cream (MYCOSTATIN) Apply 1 application topically 2 (two) times daily as needed for dry skin. Yeast under pannus  . rosuvastatin (CRESTOR) 10 MG tablet Take 10 mg by mouth at bedtime.   . triamterene-hydrochlorothiazide (MAXZIDE-25) 37.5-25 MG tablet Take 1 tablet by mouth daily.   No facility-administered encounter medications on file as of 03/03/2017.     Allergy:  Allergies  Allergen Reactions  . Ambien [Zolpidem Tartrate] Other (See Comments)    Sleep walking  . Bee Venom Hives    Difficulty breathing, carries an EPI-pen  . Cortisone Nausea And Vomiting, Swelling and Other (See Comments)    INJECTED CORTISONE ONLY, "sick to my stomach, throwing up, hand swelling, and pain."    Social Hx:   Social History   Social History  . Marital status: Widowed    Spouse name: BernIlona SorrelNumber of children: 2  . Years of education: Master's   Occupational History  . Not on file.   Social History Main Topics  . Smoking status:  Never Smoker  . Smokeless tobacco: Never Used  . Alcohol use No  . Drug use: No  . Sexual activity: Not on file   Other Topics Concern  . Not on file   Social History Narrative   Patient is married (BerIlona Sorreld lives at home with her husband.   Patient has 2 children by birth and 13 children all together.   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   Patient drinks very little caffeine.             Past Surgical Hx:  Past Surgical History:  Procedure Laterality Date  . ABDOMINAL HYSTERECTOMY     early [id number]    . CHOLECYSTECTOMY     early [id number] W/ URETERAL STENT PLACEMENT Left 07/09/2015   Procedure: CYSTOSCOPY WITH LEFT RETROGRADE PYELOGRAM/ LEFT URETERAL STENT PLACEMENT;  Surgeon: BenjArdis Hughs;  Location: [facility];  Service: Urology;  Laterality: Left;  . EXPLORATORY LAPAROTOMY  11/08/12   BSO, appendectomy, omentectomy  . INCISIONAL HERNIA REPAIR N/A 12/25/2015   Procedure:  INCISIONAL HERNIA REPAIR;  Surgeon: ArmaRalene Ok;  Location: [facility];  Service: General;  Laterality: N/A;  . INSERTION OF MESH N/A 12/25/2015   Procedure: INSERTION OF MESH;  Surgeon: Ralene Ok, MD;  Location: [facility];  Service: General;  Laterality: N/A;  . LAPAROTOMY Bilateral 11/08/2012   Procedure: EXPLORATORY LAPAROTOMY TOTAL ABDOMINAL HYSTERECTOMY BILATERAL SALPINGO OOPHORECTOMY TUMOR DEBULKING ;  Surgeon: Imagene Gurney A. Alycia Rossetti, MD;  Location: [facility];  Service: Gynecology;  Laterality: Bilateral;  APPENDECTOMY / OMENTECTOMY  . LAPAROTOMY N/A 12/25/2015   Procedure: EXPLORATORY LAPAROTOMY;  Surgeon: Ralene Ok, MD;  Location: [facility];  Service: General;  Laterality: N/A;  . LYSIS OF ADHESION N/A 12/25/2015   Procedure: LYSIS OF ADHESION;  Surgeon: Ralene Ok, MD;  Location: [facility];  Service: General;  Laterality: N/A;  . TRIGGER FINGER RELEASE      Past Medical Hx:  Past Medical History:  Diagnosis Date  . Burning mouth syndrome   .  Chronic fatigue   . Constipation   . Diarrhea    in the past after gallbladder removal  . Fibromyalgia   . GERD (gastroesophageal reflux disease)   . History of kidney stones 07/2015  . Hyperlipidemia   . Hypertension    borderline not on meds   . Lumbar disc disease   . Ovarian cancer ([place]) 2014  . Pelvic mass in female   . Pneumonia    hx of pneumonia as an infant  . PONV (postoperative nausea and vomiting)   . Shortness of breath    with exertion   . Sleep apnea    ;neurologist informed pt she no longer needed her CPAP machine  . 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 IA ovarian and IIIB fallopian tube      12/13/2012 - 03/28/2013 Chemotherapy    s/p 6 cycles of paclitaxel and carboplatin       Family Hx:  Family History  Problem Relation Age of Onset  . Lung cancer Mother 45  . High blood pressure Mother   . High Cholesterol Mother   . Lung cancer Father 47       2 ppd smoker  . Parkinson's disease Father   . Kidney disease Unknown     Vitals:  Blood pressure (!) 142/77, pulse 71, temperature 97.7 F (36.5 C), temperature source Oral, resp. rate 18, height 5' (1.524 m), weight 196 lb 12.8 oz (89.3 kg), SpO2 100 %.  Physical Exam: General: Well developed, well nourished female in no acute distress. Alert and oriented x 3.   Head/Neck:  Supple without any enlargements.   Lymph node survey: No cervical, supraclavicular, or inguinal  adenopathy.   Cardiovascular: Regular rate and rhythm.   Lungs: Clear to auscultation bilaterally.   Abdomen: Abdomen soft, non-tender and obese. Active bowel sounds in all quadrants. No evidence of a fluid wave. Midline abdominal incision well healed.  Palpable hernia superior to her vertical midline incision. It is easily reducible. The opening measures approximately 3-1/2-4 cm. She most likely has transverse colon going up into the hernia.  No rebound or guarding. No hepatosplenomegaly. + skin changes under pannus and in crease of groin c/w candida.  Groins: No lymphadenopathy  Genitourinary:  Vulva/vagina: Normal external female genitalia. No lesions.  Urethra: No lesions or masses.  Vagina: No masses or nodularity Rectal: Good tone, no palpable masses or nodularity.   Extremities: No bilateral cyanosis, edema, or clubbing.   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 She was completely resected to an R0 in 4/14. She's completed her chemotherapy and had a post treatment CT scan that was negative in 9/14 and most recently in 11/16. Her last CA 125 was normal. It has never been elevated.  She has no evidence of recurrent disease.  As she is 4 years out from the time of her treatment she can return to see [place] in 6 months. She at this time is still uncertain whether or not she wants to keep her port or have it removed. At this time she will keep it. I will be something that we will discuss moving forward.  She has candida under her pannus in the crease of her groins. She does have nystatin and she started reusing that. She will follow-up with her other physicians as scheduled. She will return to see [place] in 6 months. She has a port flush appointment scheduled for August and September.   Sabre Leonetti A., MD 03/03/2017, 9:37 AM

## 2017-03-03 NOTE — Patient Instructions (Signed)
Plan to follow up in six months or sooner if needed.  Please call in Oct or Nov 2018 to schedule [phone]

## 2017-03-18 DIAGNOSIS — G4733 Obstructive sleep apnea (adult) (pediatric): Secondary | ICD-10-CM | POA: Diagnosis not present

## 2017-03-22 DIAGNOSIS — G4733 Obstructive sleep apnea (adult) (pediatric): Secondary | ICD-10-CM | POA: Diagnosis not present

## 2017-03-25 DIAGNOSIS — N200 Calculus of kidney: Secondary | ICD-10-CM | POA: Diagnosis not present

## 2017-03-25 DIAGNOSIS — M797 Fibromyalgia: Secondary | ICD-10-CM | POA: Diagnosis not present

## 2017-03-25 DIAGNOSIS — I10 Essential (primary) hypertension: Secondary | ICD-10-CM | POA: Diagnosis not present

## 2017-03-25 DIAGNOSIS — G47419 Narcolepsy without cataplexy: Secondary | ICD-10-CM | POA: Diagnosis not present

## 2017-03-25 DIAGNOSIS — E782 Mixed hyperlipidemia: Secondary | ICD-10-CM | POA: Diagnosis not present

## 2017-03-25 DIAGNOSIS — E559 Vitamin D deficiency, unspecified: Secondary | ICD-10-CM | POA: Diagnosis not present

## 2017-03-25 DIAGNOSIS — G4733 Obstructive sleep apnea (adult) (pediatric): Secondary | ICD-10-CM | POA: Diagnosis not present

## 2017-03-25 DIAGNOSIS — R739 Hyperglycemia, unspecified: Secondary | ICD-10-CM | POA: Diagnosis not present

## 2017-03-25 DIAGNOSIS — F419 Anxiety disorder, unspecified: Secondary | ICD-10-CM | POA: Diagnosis not present

## 2017-04-14 ENCOUNTER — Ambulatory Visit (HOSPITAL_BASED_OUTPATIENT_CLINIC_OR_DEPARTMENT_OTHER): Payer: Medicare HMO

## 2017-04-14 DIAGNOSIS — Z95828 Presence of other vascular implants and grafts: Secondary | ICD-10-CM

## 2017-04-14 DIAGNOSIS — C5701 Malignant neoplasm of right fallopian tube: Secondary | ICD-10-CM

## 2017-04-14 MED ORDER — HEPARIN SOD (PORK) LOCK FLUSH 100 UNIT/ML IV SOLN
500.0000 [IU] | Freq: Once | INTRAVENOUS | Status: AC
  Administered 2017-04-14: 500 [IU] via INTRAVENOUS
  Filled 2017-04-14: qty 5

## 2017-04-14 MED ORDER — SODIUM CHLORIDE 0.9% FLUSH
10.0000 mL | INTRAVENOUS | Status: DC | PRN
  Administered 2017-04-14: 10 mL via INTRAVENOUS
  Filled 2017-04-14: qty 10

## 2017-04-14 NOTE — Patient Instructions (Signed)

## 2017-04-18 DIAGNOSIS — G4733 Obstructive sleep apnea (adult) (pediatric): Secondary | ICD-10-CM | POA: Diagnosis not present

## 2017-05-05 ENCOUNTER — Encounter: Payer: Self-pay | Admitting: Neurology

## 2017-05-05 ENCOUNTER — Ambulatory Visit (INDEPENDENT_AMBULATORY_CARE_PROVIDER_SITE_OTHER): Payer: Medicare HMO | Admitting: Neurology

## 2017-05-05 VITALS — BP 164/84 | HR 89 | Wt 196.0 lb

## 2017-05-05 DIAGNOSIS — G4733 Obstructive sleep apnea (adult) (pediatric): Secondary | ICD-10-CM | POA: Diagnosis not present

## 2017-05-05 DIAGNOSIS — M3500 Sicca syndrome, unspecified: Secondary | ICD-10-CM

## 2017-05-05 DIAGNOSIS — F5102 Adjustment insomnia: Secondary | ICD-10-CM | POA: Diagnosis not present

## 2017-05-05 DIAGNOSIS — G4719 Other hypersomnia: Secondary | ICD-10-CM | POA: Diagnosis not present

## 2017-05-05 DIAGNOSIS — Z9989 Dependence on other enabling machines and devices: Secondary | ICD-10-CM | POA: Diagnosis not present

## 2017-05-05 MED ORDER — ARMODAFINIL 250 MG PO TABS: 250.0000 mg | ORAL_TABLET | Freq: Every day | ORAL | 3 refills | Status: DC

## 2017-05-05 MED ORDER — ESZOPICLONE 2 MG PO TABS: 2.0000 mg | ORAL_TABLET | Freq: Every evening | ORAL | 1 refills | Status: DC | PRN

## 2017-05-05 NOTE — Progress Notes (Addendum)
[facility]  Provider:  Larey Seat, M D  Referring Provider: Cari Caraway, MD at [facility].  Primary Care Physician:    Review sleepiness, CPAP compliance.    HPI:  Kristin Webb is a 71 y.o. female , with  a history of OSA on CPAP -and persistent fatigue, is seen here as a  revisit  from Dr. Nancy Marus  for CPAP follow up/ compliance visit.   Interval history from 05/05/2017, Kristin Webb is doing well, she is living by herself with her dog, recently had some home restoration to do. This added a little bit of hectic to her life, however she is doing well and her counselor has not progressed. She is followed by a new oncologist, [name]. She underwent another HST following significant weight gain, AHI was 9.1 /hr. She has many desaturations and was placed on auto CPAP in response to high EDS with mild apnea.  Her compliance to CPAP with excellent at 90% with an average use of 8 hours and 32 minutes, she is using an AutoSet between 5 and 15 cm water was 1 cm EPR and her residual AHI is 1.9. This is excellent resolution of her apnea. The 95th percentile pressure is 14.8, there were no central apneas emerging, there is no need to adjust the machine in any way. She endorsed the Epworth Sleepiness Scale at 9 points, fatigue severity at 44 points and the geriatric depression score is 1 out of 15 points. She continues to participate in a grief group after her husband's death. She started Yoga, Zen knitting and fosters dogs.     12-12-13, Since I have seen her last this patient was diagnosed with ovarian cancer, underwent surgery and chemotherapy. She currently struggles with a weak  abdominal and core musculatur  and it feels to her she had an abdominal hernia. She has tried a corsett , but this has aggravated her bulging discs in her spine.  The patient was originally referred by Dr. Sheryn Bison at the time with fatigue , pain, excessive sleepiness and burning mouth syndrome. In 2010  she developed transiently left mouth droop, and a TIA/ CVA work up was negative. EMG and nerve conduction studies were unrevealing , a rheumatology consult,  pain specialist , neurosurgical evaluation were unrevealing .  Labs revealed a vitamin D deficiency,  normal TSH and CMET, CBC.  Her neurosurgery consult for back pain was without results.  She was referred to me for a sleep consultation in 2009. The patient was diagnosed with sleep apnea on 03-07-08 with an AHI of 24.3 and in REM AHI of 82. Her BMI at the time was 37.9 , she was titrated to 10 cm water pressure on CPAP  which relieved her AHI but she still snored.  Kristin Webb's newest  sleep study dated 11-26-13 whowed  residual apnea. The study was therefore a normal polysomnography and not split into a titration parts. The AHI was 0.9, the RDI was 2.4 and the oxygen nadir was 87% time and his saturation was 33.6 minutes at or below 90% saturation of oxygen. Heart which was regular, normal sinus rhythm prevailed. She still sleeps better with Lunesta 2 mg could not get a good result from 1 mg pills. Fragmentation of sleep, alpha intrusion can be seen in chronic pain and fatigue. She is not a coffee drinker, no caffeine to cut out. There is no pain in sleep. Sleep psychology referral is problematic with [employer].  Sleep hygiene was discussed.  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 She has compliantly  used a  Original machine,  New the CPAP  machine seems to have failed. I am unable to obtain any data. In addition the patient had remained excessively fatigued and daytime sleepy. Possibly , this could have been a paraneoplastic manifestation of ovarian malignancy.  She has been using Nuvigil  as tolerated which has given her improvement in life quality and energy. The last CPAP  download was on 05-06-12 with the 95% percentile pressure of 12 cm water, the residual AHI of 0.5 and an average of 5.9 hours  of night-user time for CPAP.   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 . [name] since being diagnosed with ovarian cancer has underwent a lot of emotional changes as well. Her oncologist, Dr. Marko Plume, feels that some of the nausea is GERD-   So far since last week has not been a palpable results. The patient has also tried to wean off her sleep aids but became panicked and insomniac again.Kristin Webb had been admitted to the hospital after a spot on his lung was incidentally found , while being evaluated for kidney stones. He went into acute respiratory distress was placed on oxygen oxygen and readmitted to the hospital but never recovered. And during that time she needed to return to her previous insomnia regimen. She is under financial distress, had a lot of difficulties since her husbands death.  CD Interval history from 05/18/2016, Kristin Webb has been able to cut down on her Lunesta dose from 2 mg to 1 mg and uses if necessary Nuvigil to drive. She has slept usually uninterrupted as of last summer also thinks have changed again in 2017 now she begun in August to wake up 3-4 times a week in AM with a headache, very unusual for her. Nausea is associated with these headaches, no photophobia or phonophobia is reported. And as of August she has needed and used Nuvigil 125 g daily again.  No waking up in the night, told that she snores by a roommate during a church trip. Her fatigue is reminiscent of the times when she had suffered from sleep apnea.   CD Interval history from 07/15/2016, Kristin Webb is here to see the results of her recent home sleep test, performed on 06/10/2016. Her AHI was 2.2 her RDI was 5 per hour there was no significant sleep apnea noted no oxygen desaturation was found, and she did not have tachycardia or bradycardia arrhythmia. Her fatigue has  remained her Epworth sleepiness score has actually increased to 12 point from 10 during our last visit. She states that she often only gets up in the morning before because she has to let the dog out but then feels that she needs another hour of sleep afterwards. She also suffers from dry mouth and dry eyes. There is a possible autoimmune component, and she does suffer from fibromyalgia.  She does not endorse a significant depression score since she lost her husband she has been grieving but she has not "fallen into a deep hole". She sleeps with the help of Lunesta 1 mg and Benadryl, she has failed Ambien which caused her to sleep walk. She was also significantly more drowsy and daytime. She could neither tolerate 10 not 5 mg.    Review of Systems: Out of a complete 14 system review, the patient complains  of only the following symptoms, and all other reviewed systems are negative. Fatigue , EDS, abdominal distention, dry mouth. Dry eyes, chemotherapy related ? Morning headaches and morning nausea reported- improved on smaller portions. .  She still uses Lunesta for insomnia, had temporarily doubled the dose at the time of her tumour diagnosis.   1 mg of Lunesta and 25 mg of Benadryl at night.  She eats earlier , takes snakes which helps her nausea- fatigue and sleepiness.     Social History   Social History  . Marital status: Widowed    Spouse name: Kristin Webb  . Number of children: 2  . Years of education: Master's   Occupational History  . Not on file.   Social History Main Topics  . Smoking status: Never Smoker  . Smokeless tobacco: Never Used  . Alcohol use No  . Drug use: No  . Sexual activity: Not on file   Other Topics Concern  . Not on file   Social History Narrative   Patient is married Kristin Webb) and lives at home with her husband.   Patient has 2 children by birth and 13 children all together.   Patient has a Oceanographer.   Patient is right-handed.   Patient drinks very  little caffeine.             Family History  Problem Relation Age of Onset  . Lung cancer Mother 29  . High blood pressure Mother   . High Cholesterol Mother   . Lung cancer Father 64       2 ppd smoker  . Parkinson's disease Father   . Kidney disease Unknown     Past Medical History:  Diagnosis Date  . Burning mouth syndrome   . Chronic fatigue   . Constipation   . Diarrhea    in the past after gallbladder removal  . Fibromyalgia   . GERD (gastroesophageal reflux disease)   . History of kidney stones 07/2015  . Hyperlipidemia   . Hypertension    borderline not on meds   . Lumbar disc disease   . Ovarian cancer ([place]) 2014  . Pelvic mass in female   . Pneumonia    hx of pneumonia as an infant  . PONV (postoperative nausea and vomiting)   . Shortness of breath    with exertion   . Sleep apnea    ;neurologist informed pt she no longer needed her CPAP machine  . Yeast infection     Past Surgical History:  Procedure Laterality Date  . ABDOMINAL HYSTERECTOMY     early 44s  . APPENDECTOMY    . CHOLECYSTECTOMY     early 69s  . CYSTOSCOPY W/ URETERAL STENT PLACEMENT Left 07/09/2015   Procedure: CYSTOSCOPY WITH LEFT RETROGRADE PYELOGRAM/ LEFT URETERAL STENT PLACEMENT;  Surgeon: Ardis Hughs, MD;  Location: [facility];  Service: Urology;  Laterality: Left;  . EXPLORATORY LAPAROTOMY  11/08/12   BSO, appendectomy, omentectomy  . INCISIONAL HERNIA REPAIR N/A 12/25/2015   Procedure:  INCISIONAL HERNIA REPAIR;  Surgeon: Ralene Ok, MD;  Location: [facility];  Service: General;  Laterality: N/A;  . INSERTION OF MESH N/A 12/25/2015   Procedure: INSERTION OF MESH;  Surgeon: Ralene Ok, MD;  Location: [facility];  Service: General;  Laterality: N/A;  . LAPAROTOMY Bilateral 11/08/2012   Procedure: EXPLORATORY LAPAROTOMY TOTAL ABDOMINAL HYSTERECTOMY BILATERAL SALPINGO OOPHORECTOMY TUMOR DEBULKING ;  Surgeon: Imagene Gurney A. Alycia Rossetti, MD;  Location: [facility];  Service: Gynecology;   Laterality: Bilateral;  APPENDECTOMY / OMENTECTOMY  . LAPAROTOMY N/A 12/25/2015   Procedure: EXPLORATORY LAPAROTOMY;  Surgeon: Ralene Ok, MD;  Location: [facility];  Service: General;  Laterality: N/A;  . LYSIS OF ADHESION N/A 12/25/2015   Procedure: LYSIS OF ADHESION;  Surgeon: Ralene Ok, MD;  Location: [facility];  Service: General;  Laterality: N/A;  . TRIGGER FINGER RELEASE      Current Outpatient Prescriptions  Medication Sig Dispense Refill  . Armodafinil (NUVIGIL) 250 MG tablet Take 1 tablet (250 mg total) by mouth daily. (Patient taking differently: Take 250 mg by mouth daily. Pt takes 1/2 to 1 tablet daily) 45 tablet 3  . colestipol (COLESTID) 1 g tablet Take by mouth daily as needed (For diarrhea.). Pt taking 1/2 to 1 tablet daily    . EPINEPHrine (EPIPEN 2-PAK) 0.3 mg/0.3 mL DEVI Inject 0.3 mg into the muscle once as needed (For bee stings.). Reported on 01/20/2016    . eszopiclone (LUNESTA) 2 MG TABS tablet Take 1 tablet (2 mg total) by mouth at bedtime as needed for sleep. Take immediately before bedtime (Patient taking differently: Take 1 mg by mouth at bedtime as needed for sleep. Take immediately before bedtime) 90 tablet 1  . FLUoxetine (PROZAC) 10 MG capsule Take 10 mg by mouth daily. Takes with a 20mg  capsule for her total dose of 30mg .    . FLUoxetine (PROZAC) 20 MG capsule Take 20 mg by mouth daily. Takes with a 10mg  capsule for her total dose of 30mg .    . ibuprofen (ADVIL,MOTRIN) 200 MG tablet Take 200 mg by mouth every 6 (six) hours as needed for pain.    . Multiple Vitamin (MULTIVITAMIN WITH MINERALS) TABS Take 1 tablet by mouth daily.    Marland Kitchen nystatin (NYSTATIN) powder Apply to affected areas bid prn    . nystatin cream (MYCOSTATIN) Apply 1 application topically 2 (two) times daily as needed for dry skin. Yeast under pannus 30 g 1  . rosuvastatin (CRESTOR) 10 MG tablet Take 10 mg by mouth at bedtime.     . triamterene-hydrochlorothiazide (MAXZIDE-25) 37.5-25 MG tablet  Take 1 tablet by mouth daily. 30 tablet 1   No current facility-administered medications for this visit.     Allergies as of 05/05/2017 - Review Complete 05/05/2017  Allergen Reaction Noted  . Ambien [zolpidem tartrate] Other (See Comments) 12/10/2015  . Bee venom Hives 11/02/2012  . Cortisone Nausea And Vomiting, Swelling, and Other (See Comments) 11/02/2012    Vitals: BP (!) 164/84   Pulse 89   Wt 196 lb (88.9 kg)   BMI 38.28 kg/m  Last Weight:  Wt Readings from Last 1 Encounters:  05/05/17 196 lb (88.9 kg)   Last Height:   Ht Readings from Last 1 Encounters:  03/03/17 5' (1.524 m)    Physical exam: Regained weight again. Fatigued again.  General: The patient is awake, alert and appears not in acute distress.  She reports fatigue, is well groomed. Head: Normocephalic,  Neck is supple. Mallampati 3 , neck circumference: 15. Retrognathia. Dry mouth .  Dental  Decay  Cardiovascular:  Regular rate and rhythm without  murmurs or carotid bruit, and without distended neck veins. Respiratory: Lungs are clear to auscultation. Skin:  Without evidence of edema, or rash Trunk: BMI remains elevated, 35 . Neurologic exam : The patient is awake and alert, oriented to place and time.   Memory subjective  described as intact. There is a normal attention span & concentration ability.  Speech is fluent without  dysarthria, dysphonia or aphasia. Mood and affect are appropriate.  Cranial nerves: Pupils are equal and briskly reactive to light.Visual fields by finger perimetry are intact. Hearing to finger rub intact.  Facial sensation intact to fine touch. Facial motor strength is symmetric , her tongue and uvula move in  midline. Motor exam:  Normal tone ,muscle bulk and symmetric strength in all extremities. Grip strength is improved since 2016 .  Gait and station: Patient walks without assistive device , Strength within normal limits. Stance is stable and normal.  Steps are unfragmented.   Deep tendon reflexes: in the  upper and lower extremities are symmetric and intact.    Assessment:  After physical and neurologic examination, review of laboratory studies, imaging, neurophysiology testing and pre-existing records, assessment is   1) OSA : resolved as of 2016 ( AHI was 0.9).   Now  After HST again OSA found,  AHI was 2.2,   Yet patient still was found improved on CPAP- she continues with auto CPAP.  2) depression has lifted- fatigued and grieving - since her husband died. Fatigue improved prn modafinil.  3)  Ovarian cancer survivor , anxiety and grief. follows with hospice and Cancer center .  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    Plan: Lunesta 1 mg refill. Nuvigil refill. Take nuvigil in AM hours.  Weaned off Oxycontin. No pain.  Remains fatigued while not having clinically significant apnea. Trigger fingers - exercises, OT .  Larey Seat, MD   05-05-2017    Cc Cari Caraway. MD/  Aura Dials, MD

## 2017-05-05 NOTE — Patient Instructions (Signed)
Please remember to try to maintain good sleep hygiene, which means: Keep a regular sleep and wake schedule, try not to exercise or have a meal within 2 hours of your bedtime, try to keep your bedroom conducive for sleep, that is, cool and dark, without light distractors such as an illuminated alarm clock, and refrain from watching TV right before sleep or in the middle of the night and do not keep the TV or radio on during the night. Also, try not to use or play on electronic devices at bedtime, such as your cell phone, tablet PC or laptop. If you like to read at bedtime on an electronic device, try to dim the background light as much as possible. Do not eat in the middle of the night.    For chronic insomnia, you are best followed by a psychiatrist and/or sleep psychologist.    CPAP and BiPAP Information CPAP and BiPAP are methods of helping a person breathe with the use of air pressure. CPAP stands for "continuous positive airway pressure." BiPAP stands for "bi-level positive airway pressure." In both methods, air is blown through your nose or mouth and into your air passages to help you breathe well. CPAP and BiPAP use different amounts of pressure to blow air. With CPAP, the amount of pressure stays the same while you breathe in and out. With BiPAP, the amount of pressure is increased when you breathe in (inhale) so that you can take larger breaths. Your health care provider will recommend whether CPAP or BiPAP would be more helpful for you. Why are CPAP and BiPAP treatments used? CPAP or BiPAP can be helpful if you have:  Sleep apnea.  Chronic obstructive pulmonary disease (COPD).  Heart failure.  Medical conditions that weaken the muscles of the chest including muscular dystrophy, or neurological diseases such as amyotrophic lateral sclerosis (ALS).  Other problems that cause breathing to be weak, abnormal, or difficult.  CPAP is most commonly used for obstructive sleep apnea (OSA) to  keep the airways from collapsing when the muscles relax during sleep. How is CPAP or BiPAP administered? Both CPAP and BiPAP are provided by a small machine with a flexible plastic tube that attaches to a plastic mask. You wear the mask. Air is blown through the mask into your nose or mouth. The amount of pressure that is used to blow the air can be adjusted on the machine. Your health care provider will determine the pressure setting that should be used based on your individual needs. When should CPAP or BiPAP be used? In most cases, the mask only needs to be worn during sleep. Generally, the mask needs to be worn throughout the night and during any daytime naps. People with certain medical conditions may also need to wear the mask at other times when they are awake. Follow instructions from your health care provider about when to use the machine. What are some tips for using the mask?  Because the mask needs to be snug, some people feel trapped or closed-in (claustrophobic) when first using the mask. If you feel this way, you may need to get used to the mask. One way to do this is by holding the mask loosely over your nose or mouth and then gradually applying the mask more snugly. You can also gradually increase the amount of time that you use the mask.  Masks are available in various types and sizes. Some fit over your mouth and nose while others fit over just your nose.   If your mask does not fit well, talk with your health care provider about getting a different one.  If you are using a mask that fits over your nose and you tend to breathe through your mouth, a chin strap may be applied to help keep your mouth closed.  The CPAP and BiPAP machines have alarms that may sound if the mask comes off or develops a leak.  If you have trouble with the mask, it is very important that you talk with your health care provider about finding a way to make the mask easier to tolerate. Do not stop using the mask.  Stopping the use of the mask could have a negative impact on your health. What are some tips for using the machine?  Place your CPAP or BiPAP machine on a secure table or stand near an electrical outlet.  Know where the on/off switch is located on the machine.  Follow instructions from your health care provider about how to set the pressure on your machine and when you should use it.  Do not eat or drink while the CPAP or BiPAP machine is on. Food or fluids could get pushed into your lungs by the pressure of the CPAP or BiPAP.  Do not smoke. Tobacco smoke residue can damage the machine.  For home use, CPAP and BiPAP machines can be rented or purchased through home health care companies. Many different brands of machines are available. Renting a machine before purchasing may help you find out which particular machine works well for you.  Keep the CPAP or BiPAP machine and attachments clean. Ask your health care provider for specific instructions. Get help right away if:  You have redness or open areas around your nose or mouth where the mask fits.  You have trouble using the CPAP or BiPAP machine.  You cannot tolerate wearing the CPAP or BiPAP mask.  You have pain, discomfort, and bloating in your abdomen. Summary  CPAP and BiPAP are methods of helping a person breathe with the use of air pressure.  Both CPAP and BiPAP are provided by a small machine with a flexible plastic tube that attaches to a plastic mask.  If you have trouble with the mask, it is very important that you talk with your health care provider about finding a way to make the mask easier to tolerate. This information is not intended to replace advice given to you by your health care provider. Make sure you discuss any questions you have with your health care provider. Document Released: 04/24/2004 Document Revised: 06/15/2016 Document Reviewed: 06/15/2016 Elsevier Interactive Patient Education  2017 Elsevier  Inc.   

## 2017-05-12 ENCOUNTER — Ambulatory Visit: Payer: Medicare HMO | Admitting: Gynecologic Oncology

## 2017-05-12 ENCOUNTER — Other Ambulatory Visit: Payer: Self-pay

## 2017-05-12 ENCOUNTER — Ambulatory Visit (HOSPITAL_BASED_OUTPATIENT_CLINIC_OR_DEPARTMENT_OTHER): Payer: Medicare HMO

## 2017-05-12 ENCOUNTER — Other Ambulatory Visit: Payer: Medicare HMO

## 2017-05-12 DIAGNOSIS — Z452 Encounter for adjustment and management of vascular access device: Secondary | ICD-10-CM | POA: Diagnosis not present

## 2017-05-12 DIAGNOSIS — C5701 Malignant neoplasm of right fallopian tube: Secondary | ICD-10-CM | POA: Diagnosis not present

## 2017-05-12 DIAGNOSIS — C561 Malignant neoplasm of right ovary: Secondary | ICD-10-CM

## 2017-05-12 DIAGNOSIS — Z95828 Presence of other vascular implants and grafts: Secondary | ICD-10-CM

## 2017-05-12 MED ORDER — SODIUM CHLORIDE 0.9% FLUSH
10.0000 mL | INTRAVENOUS | Status: DC | PRN
  Administered 2017-05-12: 10 mL via INTRAVENOUS
  Filled 2017-05-12: qty 10

## 2017-05-12 MED ORDER — HEPARIN SOD (PORK) LOCK FLUSH 100 UNIT/ML IV SOLN
500.0000 [IU] | Freq: Once | INTRAVENOUS | Status: AC
  Administered 2017-05-12: 500 [IU] via INTRAVENOUS
  Filled 2017-05-12: qty 5

## 2017-05-12 NOTE — Patient Instructions (Signed)

## 2017-05-18 DIAGNOSIS — G4733 Obstructive sleep apnea (adult) (pediatric): Secondary | ICD-10-CM | POA: Diagnosis not present

## 2017-05-28 ENCOUNTER — Telehealth: Payer: Self-pay | Admitting: *Deleted

## 2017-05-28 NOTE — Telephone Encounter (Signed)
Per office note moved patient's appt from October 24th to January 16th. Scheduled the patient for flush appts. Patient aware of appts

## 2017-06-02 ENCOUNTER — Ambulatory Visit: Payer: Medicare HMO | Admitting: Gynecologic Oncology

## 2017-06-16 DIAGNOSIS — Z23 Encounter for immunization: Secondary | ICD-10-CM | POA: Diagnosis not present

## 2017-06-18 DIAGNOSIS — G4733 Obstructive sleep apnea (adult) (pediatric): Secondary | ICD-10-CM | POA: Diagnosis not present

## 2017-06-23 ENCOUNTER — Telehealth: Payer: Self-pay | Admitting: Neurology

## 2017-06-23 ENCOUNTER — Ambulatory Visit (HOSPITAL_BASED_OUTPATIENT_CLINIC_OR_DEPARTMENT_OTHER): Payer: Medicare HMO

## 2017-06-23 ENCOUNTER — Other Ambulatory Visit: Payer: Self-pay | Admitting: Neurology

## 2017-06-23 DIAGNOSIS — Z9989 Dependence on other enabling machines and devices: Secondary | ICD-10-CM

## 2017-06-23 DIAGNOSIS — C5701 Malignant neoplasm of right fallopian tube: Secondary | ICD-10-CM | POA: Diagnosis not present

## 2017-06-23 DIAGNOSIS — J0120 Acute ethmoidal sinusitis, unspecified: Secondary | ICD-10-CM | POA: Diagnosis not present

## 2017-06-23 DIAGNOSIS — R05 Cough: Secondary | ICD-10-CM | POA: Diagnosis not present

## 2017-06-23 DIAGNOSIS — G4719 Other hypersomnia: Secondary | ICD-10-CM

## 2017-06-23 DIAGNOSIS — Z95828 Presence of other vascular implants and grafts: Secondary | ICD-10-CM

## 2017-06-23 DIAGNOSIS — Z452 Encounter for adjustment and management of vascular access device: Secondary | ICD-10-CM

## 2017-06-23 DIAGNOSIS — F5102 Adjustment insomnia: Secondary | ICD-10-CM

## 2017-06-23 DIAGNOSIS — G4733 Obstructive sleep apnea (adult) (pediatric): Secondary | ICD-10-CM

## 2017-06-23 DIAGNOSIS — H1013 Acute atopic conjunctivitis, bilateral: Secondary | ICD-10-CM | POA: Diagnosis not present

## 2017-06-23 MED ORDER — SODIUM CHLORIDE 0.9 % IJ SOLN
10.0000 mL | INTRAMUSCULAR | Status: DC | PRN
  Administered 2017-06-23: 10 mL via INTRAVENOUS
  Filled 2017-06-23: qty 10

## 2017-06-23 MED ORDER — HEPARIN SOD (PORK) LOCK FLUSH 100 UNIT/ML IV SOLN
500.0000 [IU] | Freq: Once | INTRAVENOUS | Status: AC | PRN
  Administered 2017-06-23: 500 [IU] via INTRAVENOUS
  Filled 2017-06-23: qty 5

## 2017-06-23 MED ORDER — ESZOPICLONE 2 MG PO TABS: 2.0000 mg | ORAL_TABLET | Freq: Every evening | ORAL | 1 refills | Status: DC | PRN

## 2017-06-23 NOTE — Telephone Encounter (Signed)
Patient requesting Rx for eszopiclone (LUNESTA) 2 MG TABS tablet called to [facility] on [address] @ [place] She dropped this Rx off at the pharmacy in October but the pharmacy says they do not have this Rx.

## 2017-06-23 NOTE — Telephone Encounter (Signed)
Called the patient back and made her aware that we would get this refilled for her and send to the pharmacy on file which I confirmed with her. Pt verbalized understanding.

## 2017-07-07 DIAGNOSIS — M25539 Pain in unspecified wrist: Secondary | ICD-10-CM | POA: Diagnosis not present

## 2017-07-07 DIAGNOSIS — E559 Vitamin D deficiency, unspecified: Secondary | ICD-10-CM | POA: Diagnosis not present

## 2017-07-07 DIAGNOSIS — M6530 Trigger finger, unspecified finger: Secondary | ICD-10-CM | POA: Diagnosis not present

## 2017-07-13 DIAGNOSIS — G4733 Obstructive sleep apnea (adult) (pediatric): Secondary | ICD-10-CM | POA: Diagnosis not present

## 2017-07-18 DIAGNOSIS — G4733 Obstructive sleep apnea (adult) (pediatric): Secondary | ICD-10-CM | POA: Diagnosis not present

## 2017-07-19 ENCOUNTER — Ambulatory Visit: Payer: Commercial Managed Care - HMO | Admitting: Neurology

## 2017-07-26 ENCOUNTER — Telehealth: Payer: Self-pay | Admitting: *Deleted

## 2017-07-26 DIAGNOSIS — H43813 Vitreous degeneration, bilateral: Secondary | ICD-10-CM | POA: Diagnosis not present

## 2017-07-26 DIAGNOSIS — H5111 Convergence insufficiency: Secondary | ICD-10-CM | POA: Diagnosis not present

## 2017-07-26 DIAGNOSIS — H04123 Dry eye syndrome of bilateral lacrimal glands: Secondary | ICD-10-CM | POA: Diagnosis not present

## 2017-07-26 DIAGNOSIS — H2513 Age-related nuclear cataract, bilateral: Secondary | ICD-10-CM | POA: Diagnosis not present

## 2017-07-26 NOTE — Telephone Encounter (Signed)
Called and left message on the machine for the patient to call the office back. Patient needs to be given new appt times for 1/14. Appts due to MD meeting

## 2017-08-16 DIAGNOSIS — M5414 Radiculopathy, thoracic region: Secondary | ICD-10-CM | POA: Diagnosis not present

## 2017-08-16 DIAGNOSIS — R0781 Pleurodynia: Secondary | ICD-10-CM | POA: Diagnosis not present

## 2017-08-18 DIAGNOSIS — G4733 Obstructive sleep apnea (adult) (pediatric): Secondary | ICD-10-CM | POA: Diagnosis not present

## 2017-08-19 ENCOUNTER — Other Ambulatory Visit: Payer: Self-pay | Admitting: Family Medicine

## 2017-08-19 DIAGNOSIS — M5414 Radiculopathy, thoracic region: Secondary | ICD-10-CM

## 2017-08-23 ENCOUNTER — Ambulatory Visit
Admission: RE | Admit: 2017-08-23 | Discharge: 2017-08-23 | Disposition: A | Discharge status: Home / Self Care | Payer: Medicare HMO | Source: Ambulatory Visit | Attending: Gynecologic Oncology | Admitting: Gynecologic Oncology

## 2017-08-23 ENCOUNTER — Other Ambulatory Visit: Payer: Self-pay | Admitting: Gynecologic Oncology

## 2017-08-23 DIAGNOSIS — N6310 Unspecified lump in the right breast, unspecified quadrant: Secondary | ICD-10-CM

## 2017-08-23 DIAGNOSIS — N63 Unspecified lump in unspecified breast: Secondary | ICD-10-CM

## 2017-08-23 DIAGNOSIS — N6311 Unspecified lump in the right breast, upper outer quadrant: Secondary | ICD-10-CM | POA: Diagnosis not present

## 2017-08-23 DIAGNOSIS — R922 Inconclusive mammogram: Secondary | ICD-10-CM | POA: Diagnosis not present

## 2017-08-23 HISTORY — DX: Insomnia due to other mental disorder: F51.05

## 2017-08-23 HISTORY — DX: Other specified anxiety disorders: F41.8

## 2017-08-24 NOTE — Progress Notes (Signed)
Consult Note: Gyn-Onc  Kristin Webb 72 y.o. female  CC:  Chief Complaint  Patient presents with  . Malignant neoplasm of ovary, unspecified laterality ([place])    HPI: Kristin Webb is a 72 year old, gravida 2 para 2, who noticed increasing abdominal swelling around the third week of January 2014. Initially, the abdominal swelling was intermittent and would come and go. She related this to recent initiation of a calcium supplement. The abdominal swelling became more prevalent starting in February with abdominal pain reported also. In addition, she started having increasing constipation and formed stools, which was a change in her routine of 5-6 loose bowel movements a day chronically after her cholecystectomy. On November 01, 2012, she had a CT scan of the abdomen and pelvis which resulted no significant biliary dilation, no suspicious liver lesions, and the spleen, adrenal glands and pancreas appeared normal. 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 She has a large midabdominal mass measuring 17 x 22.7 cm x 20 cm cephalad, which was well circumscribed without calcifications. There is irregular thickened septations and areas of solid nodularity consistent with malignancy. There is no bowel obstruction and the mass appeared to be separate from the appendix. There was probable normal left ovarian tissue seen. The uterus is surgically absent. There is some soft tissue stranding at the base of the mesentery superior to the mass and some omental nodularity.   On November 08, 2012, she underwent an exploratory laparotomy, BSO, appendectomy, infracolic omentectomy, and optimal debulking. Operative findings included: 25 cm right adnexal mass with smooth surface. Surgically absent uterus. Atrophic-appearing left ovary. Normal appearing appendix. Within the omentum there were centimeter nodules scattered throughout the omentum. The remainder of  the surfaces were benign. Final pathology revealed:  1. 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 - BENIGN FALLOPIAN TUBE WITH NONSPECIFIC CHRONIC INFLAMMATION. 2. Ovary and fallopian tube, left - BENIGN OVARY; NEGATIVE FOR ATYPIA OR MALIGNANCY. - BENIGN FALLOPIAN TUBE; NEGATIVE FOR ATYPIA OR MALIGNANCY. 3. Omentum, resection for tumor - HIGH GRADE CARCINOMA, SEE COMMENT. 4. Appendix, Other than Incidental - FIBROUS OBLITERATION OF APPENDICEAL TIP. - NEGATIVE FOR MALIGNANCY.  She completed day 1, cycle 6 of 6 planned treatments of carboplatin and taxol on 03/28/13.   9/14 CT Findings: The liver is diffusely fatty infiltrated and measures 18.7 cm in cranial caudal length. No focal intrahepatic parenchymal abnormality. The spleen is unremarkable. The stomach, duodenum, pancreas and adrenal glands are unremarkable. The gallbladder is surgically absent. 11 x 9 x 11 mm nonobstructing stone is identified in the interpolar right kidney. Stable appearance of a 9 mm fatty lesion in the right kidney, likely a tiny angiomyolipoma. No abdominal aortic aneurysm. No free fluid in the abdomen. No abdominal lymphadenopathy. Haziness in the root of the small bowel mesentery is stable. Approximately 8 cm cranial to the umbilicus is an area of apparent focal midline fascial laxity. I cannot identify a discrete fascial defect to suggest an overt hernia. This is a wide-mouthed fascial bulge and some of the transverse colon projects out into this area of fascial bulging. No bowel wall thickening, adjacent edema, orfluid to suggest complication. Imaging through the pelvis shows no free intraperitoneal fluid. No pelvic sidewall lymphadenopathy. The uterus is surgically absent. The large complex cystic lesion seen in the  right central abdomen and pelvis has been resected in the  interval. No substantial diverticular disease in the colon. There is no colonic diverticulitis. Terminal ileum is normal. The appendix is not visualized, but there is no edema or inflammation in the region of the cecum. The right gonadal vein is enlarged and contains a central filling defect consistent with thrombus. Bone windows reveal no worrisome lytic or sclerotic osseous lesions.  IMPRESSION:  Interval resection of the large right pelvic and lower abdominal mass lesion with apparent omentectomy. No evidence for intraperitoneal free fluid on today's study. No discernible peritoneal lesions. 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  CT:11/16 IMPRESSION: 1. 12 mm obstructive calculus at the left ureteropelvic junction with moderate proximal hydronephrosis. 2. 2 small supraumbilical ventral hernias, one containing a shor segment of the mid transverse colon and the other containing a short segment of the mid small bowel. There is no associated evidence to suggest bowel incarceration or obstruction at this time. 3. Tiny locule of gas non dependently in the lumen of the urinary bladder. This is presumably iatrogenic related to recent catheterization for urinalysis. Alternatively, this could be seen in the setting of urinary tract infection with gas-forming organisms. Clinical correlation for history of recent catheterization is recommended. 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 5. Status post cholecystectomy. 6. Additional incidental findings, as above.  Interval History:  She underwent a CT scan 12/11/16 that revealed: IMPRESSION: 1. No evidence of metastatic ovarian cancer. 2. Recurrent subxiphoid ventral abdominal wall hernia containing transverse colon. No evidence of incarceration or obstruction. 3. Stable incidental findings in the liver and kidneys. No recurrent urinary tract calculus. 4. Progressive lower lumbar spondylosis. 5.  Aortic Atherosclerosis  (ICD10-I70.0).  She came 7/18 for port flush and her CA-125 was 9.3 at that time. I last saw her 03/03/17.  Interval History: She comes in today for follow-up.  She states that her pain score today is a 7.  She states is not pain in any one area is just lots of little things are acting up and making her less tolerant to her pain.  She states that she has a trigger finger on her left hand.  She was given a medication by Dr. Leonides Schanz of which she does not remember the name.  She states she has chronic low back pain but now she is having some pain in the midportion of her back that radiates towards the front.  She has an MRI scheduled next week.  She has had several months of allergic conjunctivitis that is bothering her.  She also gained about 10 pounds from the beginning of December to the beginning of January as it relates to the holidays.  She is very much trying to lose weight again.  She is lost 2 pounds since the beginning of January.  Her stress urinary incontinence is stable.  She continues to have chronic diarrhea for which she takes Colestid.  However she has had some stooling.  She thinks that she is wiping well but then when she goes back to the bathroom she will notice some stooling on her undergarments or on her pad.  She did buy some wet wipes but is overall fairly itchy and irritated in the perirectal area.  There is been no bleeding.  She does not believe that her hernia has changed in size.  Review of Systems: Constitutional:  She denies any fevers.  + weight gain Skin:  No rash Cardiovascular: No chest pain,  SOB or cough Pulmonary: No cough Gastro Intestinal: No nausea, vomiting, constipation. No change in bowel movement. Bulge in abdomen, - diarrhea as above, perirectal irritation Genitourinary: Denies vaginal bleeding and discharge.  Musculoskeletal: As above Neurologic: No complaints except she notices that her hands and feet get numb more often. She wonders if it is due to her  sugars, though "I am not a diabetic" Psychology:  No changes, doing well.  Current Meds:  Outpatient Encounter Medications as of 08/25/2017  Medication Sig  . acetaminophen (TYLENOL) 325 MG tablet Take 650 mg by mouth every 6 (six) hours as needed.  . Armodafinil (NUVIGIL) 250 MG tablet Take 1 tablet (250 mg total) by mouth daily. (Patient taking differently: Take 250 mg by mouth daily. Pt reports takes half a tab)  . colestipol (COLESTID) 1 g tablet Take by mouth daily as needed (For diarrhea.). Pt taking 1/2 to 1 tablet daily  . EPINEPHrine (EPIPEN 2-PAK) 0.3 mg/0.3 mL DEVI Inject 0.3 mg into the muscle once as needed (For bee stings.). Reported on 01/20/2016  . eszopiclone (LUNESTA) 2 MG TABS tablet Take 1 tablet (2 mg total) at bedtime as needed by mouth for sleep. Take immediately before bedtime (Patient taking differently: Take 2 mg by mouth at bedtime as needed for sleep (pt reports takes half a tab). Take immediately before bedtime)  . FLUoxetine (PROZAC) 10 MG capsule Take 10 mg by mouth daily. Takes with a 20mg  capsule for her total dose of 30mg .  Marland Kitchen FLUoxetine (PROZAC) 20 MG capsule Take 20 mg by mouth daily. Takes with a 10mg  capsule for her total dose of 30mg .  . loratadine (CLARITIN) 10 MG tablet Take 10 mg by mouth daily.  . Multiple Vitamin (MULTIVITAMIN WITH MINERALS) TABS Take 1 tablet by mouth daily.  Marland Kitchen nystatin (NYSTATIN) powder Apply to affected areas bid prn  . nystatin cream (MYCOSTATIN) Apply 1 application topically 2 (two) times daily as needed for dry skin. Yeast under pannus  . rosuvastatin (CRESTOR) 10 MG tablet Take 10 mg by mouth at bedtime.   . triamterene-hydrochlorothiazide (MAXZIDE-25) 37.5-25 MG tablet Take 1 tablet by mouth daily.  . [DISCONTINUED] ibuprofen (ADVIL,MOTRIN) 200 MG tablet Take 200 mg by mouth every 6 (six) hours as needed for pain.   Facility-Administered Encounter Medications as of 08/25/2017  Medication  . sodium chloride flush (NS) 0.9 %  injection 10 mL  . [DISCONTINUED] sodium chloride 0.9 % injection 10 mL    Allergy:  Allergies  Allergen Reactions  . Ambien [Zolpidem Tartrate] Other (See Comments)    Sleep walking  . Bee Venom Hives    Difficulty breathing, carries an EPI-pen  . Cortisone Nausea And Vomiting, Swelling and Other (See Comments)    INJECTED CORTISONE ONLY, "sick to my stomach, throwing up, hand swelling, and pain."    Social Hx:   Social History   Socioeconomic History  . Marital status: Widowed    Spouse name: Kristin Webb  . Number of children: 2  . Years of education: Master's  . Highest education level: Not on file  Social Needs  . Financial resource strain: Not on file  . Food insecurity - worry: Not on file  . Food insecurity - inability: Not on file  . Transportation needs - medical: Not on file  . Transportation needs - non-medical: Not on file  Occupational History  . Not on file  Tobacco Use  . Smoking status: Never Smoker  . Smokeless tobacco: Never Used  Substance and Sexual Activity  .  Alcohol use: No  . Drug use: No  . Sexual activity: Not on file  Other Topics Concern  . Not on file  Social History Narrative   Patient is married Kristin Webb) and lives at home with her husband.   Patient has 2 children by birth and 13 children all together.   Patient has a Oceanographer.   Patient is right-handed.   Patient drinks very little caffeine.          Past Surgical Hx:  Past Surgical History:  Procedure Laterality Date  . ABDOMINAL HYSTERECTOMY     early 18s  . APPENDECTOMY    . CHOLECYSTECTOMY     early 53s  . CYSTOSCOPY W/ URETERAL STENT PLACEMENT Left 07/09/2015   Procedure: CYSTOSCOPY WITH LEFT RETROGRADE PYELOGRAM/ LEFT URETERAL STENT PLACEMENT;  Surgeon: Ardis Hughs, MD;  Location: [facility];  Service: Urology;  Laterality: Left;  . EXPLORATORY LAPAROTOMY  11/08/12   BSO, appendectomy, omentectomy  . INCISIONAL HERNIA REPAIR N/A 12/25/2015   Procedure:  INCISIONAL  HERNIA REPAIR;  Surgeon: Ralene Ok, MD;  Location: [facility];  Service: General;  Laterality: N/A;  . INSERTION OF MESH N/A 12/25/2015   Procedure: INSERTION OF MESH;  Surgeon: Ralene Ok, MD;  Location: [facility];  Service: General;  Laterality: N/A;  . LAPAROTOMY Bilateral 11/08/2012   Procedure: EXPLORATORY LAPAROTOMY TOTAL ABDOMINAL HYSTERECTOMY BILATERAL SALPINGO OOPHORECTOMY TUMOR DEBULKING ;  Surgeon: Imagene Gurney A. Alycia Rossetti, MD;  Location: [facility];  Service: Gynecology;  Laterality: Bilateral;  APPENDECTOMY / OMENTECTOMY  . LAPAROTOMY N/A 12/25/2015   Procedure: EXPLORATORY LAPAROTOMY;  Surgeon: Ralene Ok, MD;  Location: [facility];  Service: General;  Laterality: N/A;  . LYSIS OF ADHESION N/A 12/25/2015   Procedure: LYSIS OF ADHESION;  Surgeon: Ralene Ok, MD;  Location: [facility];  Service: General;  Laterality: N/A;  . TRIGGER FINGER RELEASE      Past Medical Hx:  Past Medical History:  Diagnosis Date  . Burning mouth syndrome   . Chronic fatigue   . Constipation   . Diarrhea    in the past after gallbladder removal  . Fibromyalgia   . GERD (gastroesophageal reflux disease)   . History of kidney stones 07/2015  . Hyperlipidemia   . Hypertension    borderline not on meds   . Insomnia secondary to depression with anxiety   . Lumbar disc disease   . Ovarian cancer ([place]) 2014  . Pelvic mass in female   . Pneumonia    hx of pneumonia as an infant  . PONV (postoperative nausea and vomiting)   . Shortness of breath    with exertion   . Sleep apnea    ;neurologist informed pt she no longer needed her CPAP machine  . 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 IA ovarian and IIIB fallopian tube  12/13/2012 - 03/28/2013 Chemotherapy    s/p 6 cycles of paclitaxel and carboplatin       Family Hx:  Family History  Problem Relation Age of Onset  . Lung cancer Mother 55  . High blood pressure Mother   . High Cholesterol Mother   . Lung cancer Father 62       2 ppd smoker  . Parkinson's disease Father   . Kidney disease Unknown     Vitals:  Blood pressure (!) 126/93, pulse 93, temperature 98.1 F (36.7 C), resp. rate 20, height 5' (1.524 m), weight 200 lb 8 oz (90.9 kg), SpO2 98 %.  Physical Exam: General: Well developed, well nourished female in no acute distress. Alert and oriented x 3.   Head/Neck:  Supple without any enlargements.   Lymph node survey: No cervical, supraclavicular, or inguinal adenopathy.   Cardiovascular: Regular rate and rhythm.   Lungs: Clear to auscultation bilaterally.   Abdomen: Abdomen soft, non-tender and obese. Active bowel sounds in all quadrants. No evidence of a fluid wave. Midline abdominal incision well healed. Palpable hernia superior to her vertical midline incision. It is easily reducible. The opening measures approximately 3-1/2-4 cm. She most likely has transverse colon going up into the hernia.  No rebound or guarding. No hepatosplenomegaly. + skin changes under pannus and in crease of groin c/w candida.  Groins: No lymphadenopathy  Genitourinary:  Vulva/vagina: Normal external female genitalia. No lesions.  Urethra: No lesions or masses.  Vagina: No masses or nodularity Rectal: Good tone, no palpable masses or nodularity. + small hemorrhoid, perianal erythema  Extremities: No bilateral cyanosis, edema, or clubbing.   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 She was completely resected to an R0 in 4/14. She's completed her chemotherapy and had a post treatment CT scan that was negative in 9/14 and most recently in 5/18. Her last CA 125 was normal. It has never been elevated.  She has no evidence of recurrent disease.  As she is almost 5 years out from the time of her treatment she can return to see [place] in 6 months. She at this time is still uncertain whether or not she wants to keep her port or have it removed. At this time she will keep it. I will be something that we will discuss moving forward.  She had her port accessed and flushed today and is seen 1-5 was drawn.  She will make sure that we get a copy of her MRI.  Return to see [place] in 6 months.  She is aware that this is my last day in [place] that she will be scheduled with one of the other partners.  At that point she will be more than 5 years from her diagnosis and treatment.  Discussion will need to be held whether or not she wants to continue following up with [place] or continue follow-up with Dr. Leonides Schanz and then we would be happy to see her in the future should the need arise.   Nancy Marus A., MD 08/25/2017, 12:36 PM

## 2017-08-25 ENCOUNTER — Encounter: Payer: Self-pay | Admitting: Gynecologic Oncology

## 2017-08-25 ENCOUNTER — Inpatient Hospital Stay: Payer: Medicare HMO

## 2017-08-25 ENCOUNTER — Inpatient Hospital Stay: Payer: Medicare HMO | Attending: Gynecologic Oncology | Admitting: Gynecologic Oncology

## 2017-08-25 VITALS — BP 126/93 | HR 93 | Temp 98.1°F | Resp 20 | Ht 60.0 in | Wt 200.5 lb

## 2017-08-25 DIAGNOSIS — E785 Hyperlipidemia, unspecified: Secondary | ICD-10-CM

## 2017-08-25 DIAGNOSIS — K219 Gastro-esophageal reflux disease without esophagitis: Secondary | ICD-10-CM | POA: Insufficient documentation

## 2017-08-25 DIAGNOSIS — Z79899 Other long term (current) drug therapy: Secondary | ICD-10-CM | POA: Insufficient documentation

## 2017-08-25 DIAGNOSIS — C569 Malignant neoplasm of unspecified ovary: Secondary | ICD-10-CM

## 2017-08-25 DIAGNOSIS — Z8540 Personal history of malignant neoplasm of unspecified female genital organ: Secondary | ICD-10-CM | POA: Diagnosis not present

## 2017-08-25 DIAGNOSIS — Z90710 Acquired absence of both cervix and uterus: Secondary | ICD-10-CM | POA: Diagnosis not present

## 2017-08-25 DIAGNOSIS — C561 Malignant neoplasm of right ovary: Secondary | ICD-10-CM

## 2017-08-25 DIAGNOSIS — I10 Essential (primary) hypertension: Secondary | ICD-10-CM | POA: Insufficient documentation

## 2017-08-25 DIAGNOSIS — M797 Fibromyalgia: Secondary | ICD-10-CM | POA: Diagnosis not present

## 2017-08-25 DIAGNOSIS — Z90722 Acquired absence of ovaries, bilateral: Secondary | ICD-10-CM | POA: Diagnosis not present

## 2017-08-25 DIAGNOSIS — Z95828 Presence of other vascular implants and grafts: Secondary | ICD-10-CM

## 2017-08-25 DIAGNOSIS — Z9221 Personal history of antineoplastic chemotherapy: Secondary | ICD-10-CM | POA: Diagnosis not present

## 2017-08-25 DIAGNOSIS — Z8543 Personal history of malignant neoplasm of ovary: Secondary | ICD-10-CM | POA: Diagnosis not present

## 2017-08-25 MED ORDER — SODIUM CHLORIDE 0.9 % IJ SOLN
10.0000 mL | INTRAMUSCULAR | Status: DC | PRN
  Administered 2017-08-25: 10 mL via INTRAVENOUS
  Filled 2017-08-25: qty 10

## 2017-08-25 MED ORDER — HEPARIN SOD (PORK) LOCK FLUSH 100 UNIT/ML IV SOLN
500.0000 [IU] | Freq: Once | INTRAVENOUS | Status: AC | PRN
  Administered 2017-08-25: 500 [IU] via INTRAVENOUS
  Filled 2017-08-25: qty 5

## 2017-08-25 NOTE — Patient Instructions (Addendum)
We will notify you of the results of your CA 125 from today.  Please return to see [place] in 6 months.  Please call for any questions or concerns.  Please have the MRI faxed to [phone], Attn Melissa.  Please give our office a call at [phone] in April or May to schedule an appointment in July 2019.

## 2017-08-26 ENCOUNTER — Other Ambulatory Visit: Payer: Self-pay | Admitting: Gynecologic Oncology

## 2017-08-26 DIAGNOSIS — C561 Malignant neoplasm of right ovary: Secondary | ICD-10-CM

## 2017-08-26 LAB — CA 125: Cancer Antigen (CA) 125: 20.3 U/mL (ref 0.0–38.1)

## 2017-08-30 ENCOUNTER — Ambulatory Visit
Admission: RE | Admit: 2017-08-30 | Discharge: 2017-08-30 | Disposition: A | Discharge status: Home / Self Care | Payer: Medicare HMO | Source: Ambulatory Visit | Attending: Family Medicine | Admitting: Family Medicine

## 2017-08-30 DIAGNOSIS — M5414 Radiculopathy, thoracic region: Secondary | ICD-10-CM

## 2017-08-30 DIAGNOSIS — M5134 Other intervertebral disc degeneration, thoracic region: Secondary | ICD-10-CM | POA: Diagnosis not present

## 2017-08-30 MED ORDER — GADOBENATE DIMEGLUMINE 529 MG/ML IV SOLN
19.0000 mL | Freq: Once | INTRAVENOUS | Status: AC | PRN
  Administered 2017-08-30: 19 mL via INTRAVENOUS

## 2017-09-02 ENCOUNTER — Telehealth: Payer: Self-pay | Admitting: Neurology

## 2017-09-02 ENCOUNTER — Other Ambulatory Visit: Payer: Self-pay | Admitting: Neurology

## 2017-09-02 DIAGNOSIS — G4719 Other hypersomnia: Secondary | ICD-10-CM

## 2017-09-02 DIAGNOSIS — F5102 Adjustment insomnia: Secondary | ICD-10-CM

## 2017-09-02 DIAGNOSIS — Z9989 Dependence on other enabling machines and devices: Secondary | ICD-10-CM

## 2017-09-02 DIAGNOSIS — G4733 Obstructive sleep apnea (adult) (pediatric): Secondary | ICD-10-CM

## 2017-09-02 MED ORDER — ARMODAFINIL 250 MG PO TABS: ORAL_TABLET | ORAL | 5 refills | Status: DC

## 2017-09-02 NOTE — Telephone Encounter (Signed)
Pt called Armodafinil (NUVIGIL) 250 MG tablet needs PA per [facility]. Pt will be out of the medication on Monday or Tuesday

## 2017-09-03 NOTE — Telephone Encounter (Signed)
I have submitted an urgent PA request to [facility] and should have a response within 24 hours.

## 2017-09-03 NOTE — Telephone Encounter (Signed)
PA Case: [id number], Status: Approved, Coverage Starts on: 09/03/2017 12:00:00 AM, Coverage Ends on: 08/09/2018 12:00:00 AM. Questions? Contact [phone].  Pt has been made aware.

## 2017-09-08 ENCOUNTER — Other Ambulatory Visit: Payer: Self-pay | Admitting: Neurology

## 2017-09-08 DIAGNOSIS — F5102 Adjustment insomnia: Secondary | ICD-10-CM

## 2017-09-08 DIAGNOSIS — Z9989 Dependence on other enabling machines and devices: Secondary | ICD-10-CM

## 2017-09-08 DIAGNOSIS — G4733 Obstructive sleep apnea (adult) (pediatric): Secondary | ICD-10-CM

## 2017-09-08 DIAGNOSIS — G4719 Other hypersomnia: Secondary | ICD-10-CM

## 2017-09-08 MED ORDER — ARMODAFINIL 250 MG PO TABS: ORAL_TABLET | ORAL | 1 refills | Status: DC

## 2017-09-09 ENCOUNTER — Telehealth: Payer: Self-pay | Admitting: Neurology

## 2017-09-09 NOTE — Telephone Encounter (Signed)
Script was [facility]. Will resend to this pharmacy per patient today

## 2017-09-09 NOTE — Telephone Encounter (Signed)
Pt has called to inform that [id number] - [place], [place] [address] AT [address] [phone] (Phone) [phone] (Fax)   Informed her that they see she has had the medication before but they do not have a current prescription for   Armodafinil (NUVIGIL) 250 MG tablet  Pt is asking if it needs to be resent please do so

## 2017-09-18 DIAGNOSIS — G4733 Obstructive sleep apnea (adult) (pediatric): Secondary | ICD-10-CM | POA: Diagnosis not present

## 2017-09-20 DIAGNOSIS — M5414 Radiculopathy, thoracic region: Secondary | ICD-10-CM | POA: Diagnosis not present

## 2017-09-24 ENCOUNTER — Inpatient Hospital Stay: Payer: Medicare HMO | Attending: Gynecologic Oncology

## 2017-09-24 ENCOUNTER — Telehealth: Payer: Self-pay | Admitting: *Deleted

## 2017-09-24 DIAGNOSIS — C561 Malignant neoplasm of right ovary: Secondary | ICD-10-CM

## 2017-09-24 DIAGNOSIS — C5701 Malignant neoplasm of right fallopian tube: Secondary | ICD-10-CM | POA: Diagnosis not present

## 2017-09-24 DIAGNOSIS — Z8543 Personal history of malignant neoplasm of ovary: Secondary | ICD-10-CM | POA: Diagnosis present

## 2017-09-24 DIAGNOSIS — D5701 Hb-SS disease with acute chest syndrome: Secondary | ICD-10-CM | POA: Insufficient documentation

## 2017-09-24 NOTE — Telephone Encounter (Signed)
Patient stopped by regarding her flush and MD visits. Scheduled the patient for the next two flush appts. Explained the schedule for July isn't available yet, ask patient to stop by in May at her flush appt to schedule appt. Patient verbalized understanding

## 2017-09-25 LAB — CA 125: Cancer Antigen (CA) 125: 27.1 U/mL (ref 0.0–38.1)

## 2017-09-27 ENCOUNTER — Telehealth: Payer: Self-pay | Admitting: Gynecologic Oncology

## 2017-09-27 ENCOUNTER — Telehealth: Payer: Self-pay | Admitting: *Deleted

## 2017-09-27 ENCOUNTER — Ambulatory Visit: Payer: Medicare HMO

## 2017-09-27 DIAGNOSIS — C569 Malignant neoplasm of unspecified ovary: Secondary | ICD-10-CM

## 2017-09-27 NOTE — Telephone Encounter (Signed)
Called and spoke with the patient, scheduled CT scan for February 21st at 7:30am. Gave the patient instructions and lab appt for today.

## 2017-09-27 NOTE — Telephone Encounter (Addendum)
Informed patient of CA 125 results along with Dr. Elenora Gamma recommendations for CT scan of the abdomen and pelvis to evaluate for recurrence.  Our office will call her back with her appt date and time.  All questions answered.

## 2017-09-28 ENCOUNTER — Inpatient Hospital Stay: Payer: Medicare HMO

## 2017-09-28 DIAGNOSIS — C5701 Malignant neoplasm of right fallopian tube: Secondary | ICD-10-CM | POA: Diagnosis not present

## 2017-09-28 DIAGNOSIS — C569 Malignant neoplasm of unspecified ovary: Secondary | ICD-10-CM

## 2017-09-28 DIAGNOSIS — D5701 Hb-SS disease with acute chest syndrome: Secondary | ICD-10-CM | POA: Diagnosis not present

## 2017-09-28 LAB — BASIC METABOLIC PANEL
Anion gap: 10 (ref 3–11)
BUN: 16 mg/dL (ref 7–26)
CO2: 29 mmol/L (ref 22–29)
Calcium: 10 mg/dL (ref 8.4–10.4)
Chloride: 100 mmol/L (ref 98–109)
Creatinine, Ser: 0.81 mg/dL (ref 0.60–1.10)
GFR calc Af Amer: >60 mL/min (ref >60)
GFR calc non Af Amer: >60 mL/min (ref >60)
Glucose, Bld: 107 mg/dL (ref 70–140)
Potassium: 3.9 mmol/L (ref 3.5–5.1)
Sodium: 139 mmol/L (ref 136–145)

## 2017-09-29 ENCOUNTER — Ambulatory Visit: Payer: Medicare HMO

## 2017-09-30 ENCOUNTER — Ambulatory Visit (HOSPITAL_COMMUNITY)
Admission: RE | Admit: 2017-09-30 | Discharge: 2017-09-30 | Disposition: A | Discharge status: Home / Self Care | Payer: Medicare HMO | Source: Ambulatory Visit | Attending: Gynecologic Oncology | Admitting: Gynecologic Oncology

## 2017-09-30 ENCOUNTER — Encounter (HOSPITAL_COMMUNITY): Payer: Self-pay

## 2017-09-30 DIAGNOSIS — I700 Atherosclerosis of aorta: Secondary | ICD-10-CM | POA: Insufficient documentation

## 2017-09-30 DIAGNOSIS — C569 Malignant neoplasm of unspecified ovary: Secondary | ICD-10-CM | POA: Diagnosis not present

## 2017-09-30 DIAGNOSIS — K439 Ventral hernia without obstruction or gangrene: Secondary | ICD-10-CM | POA: Diagnosis not present

## 2017-09-30 DIAGNOSIS — D3501 Benign neoplasm of right adrenal gland: Secondary | ICD-10-CM | POA: Diagnosis not present

## 2017-09-30 DIAGNOSIS — K654 Sclerosing mesenteritis: Secondary | ICD-10-CM | POA: Insufficient documentation

## 2017-09-30 DIAGNOSIS — D1771 Benign lipomatous neoplasm of kidney: Secondary | ICD-10-CM | POA: Diagnosis not present

## 2017-09-30 DIAGNOSIS — K760 Fatty (change of) liver, not elsewhere classified: Secondary | ICD-10-CM | POA: Diagnosis not present

## 2017-09-30 MED ORDER — IOPAMIDOL (ISOVUE-300) INJECTION 61%
100.0000 mL | Freq: Once | INTRAVENOUS | Status: AC | PRN
  Administered 2017-09-30: 100 mL via INTRAVENOUS

## 2017-09-30 MED ORDER — IOPAMIDOL (ISOVUE-300) INJECTION 61%
INTRAVENOUS | Status: AC
  Administered 2017-09-30: 100 mL via INTRAVENOUS
  Filled 2017-09-30: qty 100

## 2017-10-01 ENCOUNTER — Encounter: Payer: Self-pay | Admitting: Gynecologic Oncology

## 2017-10-01 ENCOUNTER — Telehealth: Payer: Self-pay | Admitting: *Deleted

## 2017-10-01 ENCOUNTER — Other Ambulatory Visit: Payer: Self-pay | Admitting: Gynecologic Oncology

## 2017-10-01 DIAGNOSIS — C5701 Malignant neoplasm of right fallopian tube: Secondary | ICD-10-CM

## 2017-10-01 NOTE — Telephone Encounter (Signed)
Called and spoke with the patient. Explained the Melissa APP will call her back with the scan results, patient to look for a blocked number.

## 2017-10-04 ENCOUNTER — Telehealth: Payer: Self-pay | Admitting: Gynecologic Oncology

## 2017-10-04 ENCOUNTER — Telehealth: Payer: Self-pay | Admitting: *Deleted

## 2017-10-04 NOTE — Telephone Encounter (Signed)
Scheduled the patient for her PET scan, appt for March 1st at [facility] and gave the patient the appt date/time.

## 2017-10-04 NOTE — Telephone Encounter (Signed)
Patient returned call.  Discussed Dr. Elenora Gamma recommendations to proceed with a PET scan then if possible surgery at [facility] with her could be an option.  Advised patient we would contact her with the appt date and time for the PET scan.  Appt with Dr. Alvy Bimler cancelled at this time. No concerns voiced.  Advised to call for any needs.

## 2017-10-04 NOTE — Telephone Encounter (Signed)
Left message for patient asking her to please call the office to discuss Dr. Elenora Gamma recommendations.

## 2017-10-06 ENCOUNTER — Ambulatory Visit: Payer: Medicare HMO | Admitting: Hematology and Oncology

## 2017-10-08 ENCOUNTER — Encounter (HOSPITAL_COMMUNITY): Payer: Medicare HMO

## 2017-10-08 ENCOUNTER — Ambulatory Visit (HOSPITAL_COMMUNITY): Payer: Medicare HMO

## 2017-10-11 ENCOUNTER — Telehealth: Payer: Self-pay | Admitting: Gynecologic Oncology

## 2017-10-11 ENCOUNTER — Ambulatory Visit (HOSPITAL_COMMUNITY): Payer: Medicare HMO

## 2017-10-11 ENCOUNTER — Encounter (HOSPITAL_COMMUNITY)
Admission: RE | Admit: 2017-10-11 | Discharge: 2017-10-11 | Disposition: A | Discharge status: Home / Self Care | Payer: Medicare HMO | Source: Ambulatory Visit | Attending: Gynecologic Oncology | Admitting: Gynecologic Oncology

## 2017-10-11 DIAGNOSIS — C5701 Malignant neoplasm of right fallopian tube: Secondary | ICD-10-CM | POA: Insufficient documentation

## 2017-10-11 DIAGNOSIS — C569 Malignant neoplasm of unspecified ovary: Secondary | ICD-10-CM | POA: Diagnosis not present

## 2017-10-11 LAB — GLUCOSE, CAPILLARY: Glucose-Capillary: 118 mg/dL — ABNORMAL HIGH (ref 65–99)

## 2017-10-11 MED ORDER — FLUDEOXYGLUCOSE F - 18 (FDG) INJECTION
9.8600 | Freq: Once | INTRAVENOUS | Status: AC | PRN
  Administered 2017-10-11: 9.86 via INTRAVENOUS

## 2017-10-11 NOTE — Telephone Encounter (Signed)
Patient informed of PET scan results.  Advised she would be contacted with Dr. Elenora Gamma recommendations.  She states she will be out of town for the first week of April but then would be good to go for surgery.  Advised to call for any needs.

## 2017-10-15 DIAGNOSIS — G4733 Obstructive sleep apnea (adult) (pediatric): Secondary | ICD-10-CM | POA: Diagnosis not present

## 2017-10-16 DIAGNOSIS — G4733 Obstructive sleep apnea (adult) (pediatric): Secondary | ICD-10-CM | POA: Diagnosis not present

## 2017-10-19 ENCOUNTER — Ambulatory Visit (HOSPITAL_COMMUNITY): Payer: Medicare HMO

## 2017-10-21 ENCOUNTER — Encounter: Payer: Self-pay | Admitting: Gynecologic Oncology

## 2017-10-21 ENCOUNTER — Inpatient Hospital Stay: Payer: Medicare HMO | Attending: Gynecologic Oncology

## 2017-10-21 DIAGNOSIS — C5701 Malignant neoplasm of right fallopian tube: Secondary | ICD-10-CM | POA: Insufficient documentation

## 2017-10-21 DIAGNOSIS — Z8543 Personal history of malignant neoplasm of ovary: Secondary | ICD-10-CM | POA: Insufficient documentation

## 2017-10-21 DIAGNOSIS — Z90710 Acquired absence of both cervix and uterus: Secondary | ICD-10-CM | POA: Insufficient documentation

## 2017-10-21 DIAGNOSIS — Z9221 Personal history of antineoplastic chemotherapy: Secondary | ICD-10-CM | POA: Insufficient documentation

## 2017-10-21 DIAGNOSIS — Z95828 Presence of other vascular implants and grafts: Secondary | ICD-10-CM

## 2017-10-21 DIAGNOSIS — Z90722 Acquired absence of ovaries, bilateral: Secondary | ICD-10-CM | POA: Insufficient documentation

## 2017-10-21 MED ORDER — SODIUM CHLORIDE 0.9 % IJ SOLN
10.0000 mL | INTRAMUSCULAR | Status: DC | PRN
  Administered 2017-10-21: 10 mL via INTRAVENOUS
  Filled 2017-10-21: qty 10

## 2017-10-21 MED ORDER — HEPARIN SOD (PORK) LOCK FLUSH 100 UNIT/ML IV SOLN
500.0000 [IU] | Freq: Once | INTRAVENOUS | Status: AC | PRN
  Administered 2017-10-21: 500 [IU] via INTRAVENOUS
  Filled 2017-10-21: qty 5

## 2017-10-21 NOTE — Progress Notes (Unsigned)
Patient arrives to the office without an appointment for evaluation of complaints of right arm pain after IV insertion with recent PET scan.  She states the pain began one hour after having the IV placed.  She states they had difficulty finding a vein and she had to be stuck several times.  She states when she got home from the PET her arm was edematous from the right wrist to the mid forearm.  No erythema was reported or increased warmth.  She states the pain has persisted and not improved but not worsened.  She now states she is having difficulty picking up heavier things with that arm.  Decreased grip strength in the right hand but full movement noted.  No erythema on the skin.  No drainage or increased warmth.  Nuclear Medicine at [facility] notified of the above situation with no recommendations given.  Discussed with Dr. Alvy Bimler.    Advised patient that her symptoms could be related to nerve injury during insertion.  Advised her that we could check her kidney function today and if normal, she could begin taking naproxen 500 mg BID.  "I have so much going on and if I have to add one more medication, it will be too much.  I will just live with it."  Reportable signs and symptoms reviewed.  She is going to call for any changes in her symptoms or concerns/questions.  She has a follow up appt with Dr. Gerarda Fraction on Wed., March 20.  Advised to call sooner for any needs.  She states she may take one or two doses of ibuprofen to see if her symptoms improve.

## 2017-10-27 ENCOUNTER — Inpatient Hospital Stay (HOSPITAL_BASED_OUTPATIENT_CLINIC_OR_DEPARTMENT_OTHER): Payer: Medicare HMO | Admitting: Obstetrics

## 2017-10-27 VITALS — BP 140/80 | HR 95 | Temp 97.5°F | Resp 20

## 2017-10-27 DIAGNOSIS — Z8543 Personal history of malignant neoplasm of ovary: Secondary | ICD-10-CM

## 2017-10-27 DIAGNOSIS — Z9221 Personal history of antineoplastic chemotherapy: Secondary | ICD-10-CM | POA: Diagnosis not present

## 2017-10-27 DIAGNOSIS — Z90710 Acquired absence of both cervix and uterus: Secondary | ICD-10-CM | POA: Diagnosis not present

## 2017-10-27 DIAGNOSIS — Z90722 Acquired absence of ovaries, bilateral: Secondary | ICD-10-CM | POA: Diagnosis not present

## 2017-10-27 DIAGNOSIS — C5701 Malignant neoplasm of right fallopian tube: Secondary | ICD-10-CM | POA: Diagnosis not present

## 2017-10-27 NOTE — Patient Instructions (Signed)
1. Interventional radiology to biopsy the abdominal wall lesion 2. Consult Dr. Alvy Bimler with medical oncology 3. Return to see Dr. Gerarda Fraction week of 4/15 for followup of above

## 2017-10-29 ENCOUNTER — Other Ambulatory Visit: Payer: Self-pay | Admitting: Gynecologic Oncology

## 2017-10-29 DIAGNOSIS — C561 Malignant neoplasm of right ovary: Secondary | ICD-10-CM

## 2017-10-29 MED ORDER — LORAZEPAM 0.5 MG PO TABS: 0.5000 mg | ORAL_TABLET | Freq: Once | ORAL | 0 refills | Status: AC

## 2017-10-29 NOTE — Progress Notes (Signed)
Patient called wanting medication to take for anxiety prior to biopsy.  She has a driver. Script for Ativan sent in.

## 2017-10-30 NOTE — Progress Notes (Signed)
Progress Note: Gyn-Onc  CC:  Chief Complaint  Patient presents with  . Carcinoma of right fallopian tube [facility])    HPI: Ms. Kristin Webb  is a very nice 72 y.o.  year old P2   Interval History:  She has been followed in surveillance by [name]. CA125 was noted in Jan 2019 to go from single digit baseline to the 20's. Repeat in 09/24/2017 confirmed at 27.  Imaging was recommended including a CT and a PET scan.   The CT 09/30/17 revealed : new clustered soft tissue nodularity in the left lower quadrant in the sigmoid mesentery with a dominant 1.0 cm nodule (series 2/image 73).    PET 10/11/17 revealed : Hypermetabolic nodules in the sigmoid mesentery measure up to 1.5 cm (CT image 158) with an SUV max of 9.1. No abnormal hypermetabolism in the liver, adrenal glands, spleen or pancreas. No hypermetabolic lymph nodes.   She denies any symptoms concerning for recurrence.  She does however note some back pain that radiates around to her front this is mid back pain for which she describes to me today.  She denies nausea vomiting and changes in bowel movement.    Initial Presentation: She noticed increasing abdominal swelling around the third week of January 2014. Initially, the abdominal swelling was intermittent and would come and go. She related this to initiation of a calcium supplement. The abdominal swelling became more prevalent starting in February with abdominal pain reported also. In addition, she started having increasing constipation and formed stools, which was a change in her routine of 5-6 loose bowel movements a day chronically after her cholecystectomy.  On November 01, 2012, she had a CT scan of the abdomen and pelvis revealing a large midabdominal mass measuring 17 x 22.7 cm x 20 cm.  On November 08, 2012, she underwent an exploratory laparotomy, BSO, appendectomy, infracolic omentectomy, and optimal debulking. Operative findings included: 25 cm right adnexal mass with smooth  surface. Surgically absent uterus. Atrophic-appearing left ovary. Normal appearing appendix. Within the omentum there were centimeter nodules scattered throughout the omentum. The remainder of the surfaces were benign. Final pathology revealed:  1. 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 - BENIGN FALLOPIAN TUBE WITH NONSPECIFIC CHRONIC INFLAMMATION. 2. Ovary and fallopian tube, left - BENIGN OVARY; NEGATIVE FOR ATYPIA OR MALIGNANCY. - BENIGN FALLOPIAN TUBE; NEGATIVE FOR ATYPIA OR MALIGNANCY. 3. Omentum, resection for tumor - HIGH GRADE CARCINOMA, SEE COMMENT. 4. Appendix, Other than Incidental - FIBROUS OBLITERATION OF APPENDICEAL TIP. - NEGATIVE FOR MALIGNANCY.   She completed cycle 6 of 6 planned treatments of carboplatin and taxol on 03/28/13.    Measurement of disease:   Recent Labs    12/02/16 1219 02/24/17 1320 08/25/17 1034 09/24/17 0956  CAN125 8.0 9.3 20.3 27.1    Radiology: Nm Pet Image Initial (pi) Skull Base To Thigh  Result Date: 10/11/2017 CLINICAL DATA:  Initial treatment strategy for ovarian/fallopian tube cancer. EXAM: NUCLEAR MEDICINE PET SKULL BASE TO THIGH TECHNIQUE: 9.9 mCi F-18 FDG was injected intravenously. Full-ring PET imaging was performed from the skull base to thigh after the radiotracer. CT data was obtained and used for attenuation correction and anatomic localization. Fasting blood glucose: 118 mg/dl Mediastinal blood pool activity: SUV max 3.3 COMPARISON:  CT abdomen pelvis 09/30/2017. FINDINGS: NECK: No hypermetabolic lymph nodes in the neck. Incidental CT findings: None. CHEST: No  hypermetabolic mediastinal, hilar or axillary lymph nodes. No hypermetabolic pulmonary nodules. Incidental CT findings: Right IJ Port-A-Cath terminates in the low SVC. No pericardial or pleural effusion. ABDOMEN/PELVIS:  Hypermetabolic nodules in the sigmoid mesentery measure up to 1.5 cm (CT image 158) with an SUV max of 9.1. No abnormal hypermetabolism in the liver, adrenal glands, spleen or pancreas. No hypermetabolic lymph nodes. Incidental CT findings: Liver appears fatty. 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 Mild mesenteric haziness and nodularity, as before. Midline ventral hernia contains unobstructed colon. No free fluid. SKELETON: No abnormal osseous hypermetabolism. Incidental CT findings: Degenerative changes in the spine. IMPRESSION: 1. Nodules in the sigmoid mesentery are hypermetabolic and highly worrisome for metastatic disease. 2. Attic steatosis. Electronically Signed   By: Lorin Picket M.D.   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 IA ovarian and IIIB fallopian tube      12/13/2012 - 03/28/2013 Chemotherapy    s/p 6 cycles of paclitaxel and carboplatin       Current Meds:  Outpatient Encounter Medications as of 10/27/2017  Medication Sig  . acetaminophen (TYLENOL) 325 MG tablet Take 650 mg by mouth every 6 (six) hours as needed.   . Armodafinil (NUVIGIL) 250 MG tablet Take one tab in the morning. (pt can also take 1/2 tab in the am and 1/2 tab at lunch if that is better) (Patient taking differently: Take 250 mg by mouth daily. )  . colestipol (COLESTID) 1 g tablet Take 0.5 g by mouth daily. Pt taking 1/2 to 1 tablet daily  .  EPINEPHrine (EPIPEN 2-PAK) 0.3 mg/0.3 mL DEVI Inject 0.3 mg into the muscle once as needed (For bee stings.). Reported on 01/20/2016  . eszopiclone (LUNESTA) 2 MG TABS tablet Take 1 tablet (2 mg total) at bedtime as needed by mouth for sleep. Take immediately before bedtime (Patient taking differently: Take 2 mg by mouth at bedtime. Take immediately before bedtime)  . FLUoxetine (PROZAC) 10 MG capsule Take 10 mg by mouth daily. Takes with a 19m capsule for her total dose of 325m  . Marland KitchenLUoxetine (PROZAC) 20 MG capsule Take 20 mg by mouth daily. Takes with a 1014mapsule for her total dose of 19m49m. Multiple Vitamin (MULTIVITAMIN WITH MINERALS) TABS Take 1 tablet by mouth daily.  . rosuvastatin (CRESTOR) 10 MG tablet Take 10 mg by mouth at bedtime.   . triamterene-hydrochlorothiazide (MAXZIDE-25) 37.5-25 MG tablet Take 1 tablet by mouth daily.  . [DISCONTINUED] Cholecalciferol (VITAMIN D PO) Take by mouth once a week.  . [DISCONTINUED] loratadine (CLARITIN) 10 MG tablet Take 10 mg by mouth daily.  . [DISCONTINUED] nystatin (NYSTATIN) powder Apply to affected areas bid prn  . [DISCONTINUED] nystatin cream (MYCOSTATIN) Apply 1 application topically 2 (two) times daily as needed for dry skin. Yeast under pannus   Facility-Administered  Encounter Medications as of 10/27/2017  Medication  . sodium chloride flush (NS) 0.9 % injection 10 mL    Allergy:  Allergies  Allergen Reactions  . Ambien [Zolpidem Tartrate] Other (See Comments)    Sleep walking  . Bee Venom Hives    Difficulty breathing, carries an EPI-pen  . Cortisone Nausea And Vomiting, Swelling and Other (See Comments)    INJECTED CORTISONE ONLY, "sick to my stomach, throwing up, hand swelling, and pain."    Social Hx:   Social History   Socioeconomic History  . Marital status: Widowed    Spouse name: [name]  . Number of children: 2  . Years of education: Master's  . Highest education level: Not on file  Occupational History  .  Not on file  Social Needs  . Financial resource strain: Not on file  . Food insecurity:    Worry: Not on file    Inability: Not on file  . Transportation needs:    Medical: Not on file    Non-medical: Not on file  Tobacco Use  . Smoking status: Never Smoker  . Smokeless tobacco: Never Used  Substance and Sexual Activity  . Alcohol use: No  . Drug use: No  . Sexual activity: Not on file  Lifestyle  . Physical activity:    Days per week: Not on file    Minutes per session: Not on file  . Stress: Not on file  Relationships  . Social connections:    Talks on phone: Not on file    Gets together: Not on file    Attends religious service: Not on file    Active member of club or organization: Not on file    Attends meetings of clubs or organizations: Not on file    Relationship status: Not on file  . Intimate partner violence:    Fear of current or ex partner: Not on file    Emotionally abused: Not on file    Physically abused: Not on file    Forced sexual activity: Not on file  Other Topics Concern  . Not on file  Social History Narrative   Patient is married [name]) and lives at home with her husband.   Patient has 2 children by birth and 13 children all together.   Patient has a Oceanographer.   Patient is right-handed.   Patient drinks very little caffeine.          Past Surgical Hx:  Past Surgical History:  Procedure Laterality Date  . ABDOMINAL HYSTERECTOMY     early 73s  . APPENDECTOMY    . CHOLECYSTECTOMY     early 48s  . CYSTOSCOPY W/ URETERAL STENT PLACEMENT Left 07/09/2015   Procedure: CYSTOSCOPY WITH LEFT RETROGRADE PYELOGRAM/ LEFT URETERAL STENT PLACEMENT;  Surgeon: Ardis Hughs, MD;  Location: [facility];  Service: Urology;  Laterality: Left;  . EXPLORATORY LAPAROTOMY  11/08/12   BSO, appendectomy, omentectomy  . INCISIONAL HERNIA REPAIR N/A 12/25/2015   Procedure:  INCISIONAL HERNIA REPAIR;  Surgeon: Ralene Ok, MD;  Location: [facility];  Service:  General;  Laterality: N/A;  . INSERTION OF MESH N/A 12/25/2015   Procedure: INSERTION OF MESH;  Surgeon: Ralene Ok, MD;  Location: [facility];  Service: General;  Laterality: N/A;  . LAPAROTOMY Bilateral 11/08/2012   Procedure: EXPLORATORY LAPAROTOMY TOTAL ABDOMINAL HYSTERECTOMY BILATERAL SALPINGO OOPHORECTOMY TUMOR DEBULKING ;  Surgeon: Imagene Gurney A. Alycia Rossetti, MD;  Location: [facility];  Service: Gynecology;  Laterality: Bilateral;  APPENDECTOMY / OMENTECTOMY  .  LAPAROTOMY N/A 12/25/2015   Procedure: EXPLORATORY LAPAROTOMY;  Surgeon: Ralene Ok, MD;  Location: [facility];  Service: General;  Laterality: N/A;  . LYSIS OF ADHESION N/A 12/25/2015   Procedure: LYSIS OF ADHESION;  Surgeon: Ralene Ok, MD;  Location: [facility];  Service: General;  Laterality: N/A;  . TRIGGER FINGER RELEASE      Past Medical Hx:  Past Medical History:  Diagnosis Date  . Burning mouth syndrome   . Chronic fatigue   . Constipation   . Diarrhea    in the past after gallbladder removal  . Fibromyalgia   . GERD (gastroesophageal reflux disease)   . History of kidney stones 07/2015  . Hyperlipidemia   . Hypertension    borderline not on meds   . Insomnia secondary to depression with anxiety   . Lumbar disc disease   . Ovarian cancer ([place]) 2014  . Pelvic mass in female   . Pneumonia    hx of pneumonia as an infant  . PONV (postoperative nausea and vomiting)   . Shortness of breath    with exertion   . Sleep apnea    ;neurologist informed pt she no longer needed her CPAP machine  . Yeast infection     Past Gynecological History:   GYNECOLOGIC HISTORY:  No LMP recorded. Patient has had a hysterectomy.  Family Hx:  Family History  Problem Relation Age of Onset  . Lung cancer Mother 18  . High blood pressure Mother   . High Cholesterol Mother   . Lung cancer Father 3       2 ppd smoker  . Parkinson's disease Father   . Kidney disease Unknown     Review of Systems:  Review of Systems  Constitutional:  Negative.   HENT: Negative.   Eyes: Negative.   Respiratory: Negative.   Cardiovascular: Negative.   Gastrointestinal: Positive for diarrhea.  Genitourinary: Negative.   Musculoskeletal: Positive for back pain.  Skin: Negative.   Neurological: Negative.   Endo/Heme/Allergies: Negative.   Psychiatric/Behavioral: Negative.     Note her diarrhea has been present since her cholecystectomy    Vitals:  Blood pressure 140/80, pulse 95, temperature (!) 97.5 F (36.4 C), temperature source Oral, resp. rate 20, SpO2 100 %.  Physical Exam: ECOG PERFORMANCE STATUS: 0 - Asymptomatic   General :  Well developed, 72 y.o., female in no apparent distress HEENT:  Normocephalic/atraumatic, symmetric, EOMI, eyelids normal Neck:   Supple, no masses.  Lymphatics:  No cervical/ submandibular/ supraclavicular/ infraclavicular/ inguinal adenopathy Respiratory:  Respirations unlabored, no use of accessory muscles CV:   Deferred Breast:  Deferred Musculoskeletal: No CVA tenderness, normal muscle strength. Abdomen:  Soft, non-tender and nondistended. No evidence of hernia. No masses. Extremities:  No lymphedema, no erythema, non-tender. Skin:   Normal inspection Neuro/Psych:  No focal motor deficit, no abnormal mental status. Normal gait. Normal affect. Alert and oriented to person, place, and time  Genito Urinary: Vulva: Normal external female genitalia.  Bladder/urethra: Urethral meatus normal in size and location. No lesions or   masses, well supported bladder Bimanual exam: Smooth vaginal walls no masses Uterus: Surgically absent   Adnexa: Surgically absent. Rectovaginal:  Good tone, no masses, no cul de sac nodularity, no parametrial involvement or nodularity.   Oncologic Summary: 1. 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 Rising CA 125 January 2019  Question  mesenteric nodules and  anterior abdominal wall nodule   Assessment/Plan: 1. Eval for recurrent disease o We discussed tissue diagnosis important o Approach could be via laparotomy, however I worry given the small size of the sigmoid mesentary lesions, these may be missed intraoperatively. o Upon review by me (with the patient present) there seems to be an abdominal wall lesion to the right of midline. This is accessible to interventional and I recommended we start with a biopsy of this region. 2. Treatment options o We discussed if the biopsy shows disease we could look at chemo followed by PARP versus proceeding with secondary debulking o Support for upfront chemo are the small size of the lesions and the possibility the tumor may be highly chemo-sensitive. i. I recommended she discuss options with Dr. Alvy Bimler. o Support for surgical resection would be confirming diagnosis should the biopsy fail. One could argue the lesion on the PET being >1cm there is benefit historically to debulking. She does not want surgery at [facility]. 3. Hernia o We discussed her hernia. She is bothered by this cosmetically, but in no pain and no GI symptoms of concern. o The hernia is quite high and I do not know how strongly I would push to have that repaired at the time of any procedure we may do. o I offered to send her to her surgeon who has previously repaired a hernia for her. She declined at this time. 4. Genetics o She tells me she was tested for genetic mutation. I would like to see if this was BRCA only and if the testing should be updated. 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 5. Followup o She plans a trip to [place] with her grandchildren in early April. She would like to meet with Dr. Alvy Bimler prior to the trip and then have the biopsy after she returns. o I will plan to see her back the week of 4/15 to see what she has decided and hopefully review the biopsy.  Face to face time  with patient was 45 minutes. Over 50% of this time was spent on counseling and coordination of care.    Isabel Caprice, MD  10/30/2017, 10:13 PM  Patient, No Pcp Per

## 2017-11-02 ENCOUNTER — Other Ambulatory Visit: Payer: Self-pay | Admitting: Radiology

## 2017-11-03 ENCOUNTER — Other Ambulatory Visit: Payer: Self-pay | Admitting: Student

## 2017-11-03 ENCOUNTER — Ambulatory Visit (HOSPITAL_COMMUNITY)
Admission: RE | Admit: 2017-11-03 | Discharge: 2017-11-03 | Disposition: A | Discharge status: Home / Self Care | Payer: Medicare HMO | Source: Ambulatory Visit | Attending: Obstetrics | Admitting: Obstetrics

## 2017-11-03 ENCOUNTER — Encounter (HOSPITAL_COMMUNITY): Payer: Self-pay

## 2017-11-03 DIAGNOSIS — R1909 Other intra-abdominal and pelvic swelling, mass and lump: Secondary | ICD-10-CM | POA: Insufficient documentation

## 2017-11-03 DIAGNOSIS — Z888 Allergy status to other drugs, medicaments and biological substances status: Secondary | ICD-10-CM | POA: Diagnosis not present

## 2017-11-03 DIAGNOSIS — R5382 Chronic fatigue, unspecified: Secondary | ICD-10-CM | POA: Insufficient documentation

## 2017-11-03 DIAGNOSIS — G4700 Insomnia, unspecified: Secondary | ICD-10-CM | POA: Diagnosis not present

## 2017-11-03 DIAGNOSIS — Z91030 Bee allergy status: Secondary | ICD-10-CM | POA: Diagnosis not present

## 2017-11-03 DIAGNOSIS — I10 Essential (primary) hypertension: Secondary | ICD-10-CM | POA: Insufficient documentation

## 2017-11-03 DIAGNOSIS — Z841 Family history of disorders of kidney and ureter: Secondary | ICD-10-CM | POA: Diagnosis not present

## 2017-11-03 DIAGNOSIS — M797 Fibromyalgia: Secondary | ICD-10-CM | POA: Diagnosis not present

## 2017-11-03 DIAGNOSIS — Z79899 Other long term (current) drug therapy: Secondary | ICD-10-CM | POA: Diagnosis not present

## 2017-11-03 DIAGNOSIS — C5701 Malignant neoplasm of right fallopian tube: Secondary | ICD-10-CM | POA: Diagnosis not present

## 2017-11-03 DIAGNOSIS — K219 Gastro-esophageal reflux disease without esophagitis: Secondary | ICD-10-CM | POA: Diagnosis not present

## 2017-11-03 DIAGNOSIS — G4730 Sleep apnea, unspecified: Secondary | ICD-10-CM | POA: Insufficient documentation

## 2017-11-03 DIAGNOSIS — M6028 Foreign body granuloma of soft tissue, not elsewhere classified, other site: Secondary | ICD-10-CM | POA: Diagnosis not present

## 2017-11-03 DIAGNOSIS — E785 Hyperlipidemia, unspecified: Secondary | ICD-10-CM | POA: Insufficient documentation

## 2017-11-03 DIAGNOSIS — Z791 Long term (current) use of non-steroidal anti-inflammatories (NSAID): Secondary | ICD-10-CM | POA: Insufficient documentation

## 2017-11-03 DIAGNOSIS — Z90710 Acquired absence of both cervix and uterus: Secondary | ICD-10-CM | POA: Insufficient documentation

## 2017-11-03 DIAGNOSIS — Z820 Family history of epilepsy and other diseases of the nervous system: Secondary | ICD-10-CM | POA: Insufficient documentation

## 2017-11-03 DIAGNOSIS — Z8543 Personal history of malignant neoplasm of ovary: Secondary | ICD-10-CM | POA: Insufficient documentation

## 2017-11-03 DIAGNOSIS — Z87442 Personal history of urinary calculi: Secondary | ICD-10-CM | POA: Diagnosis not present

## 2017-11-03 DIAGNOSIS — Z8249 Family history of ischemic heart disease and other diseases of the circulatory system: Secondary | ICD-10-CM | POA: Diagnosis not present

## 2017-11-03 DIAGNOSIS — Z9049 Acquired absence of other specified parts of digestive tract: Secondary | ICD-10-CM | POA: Insufficient documentation

## 2017-11-03 DIAGNOSIS — Z801 Family history of malignant neoplasm of trachea, bronchus and lung: Secondary | ICD-10-CM | POA: Insufficient documentation

## 2017-11-03 LAB — CBC
HCT: 44.7 % (ref 36.0–46.0)
Hemoglobin: 14.3 g/dL (ref 12.0–15.0)
MCH: 28.5 pg (ref 26.0–34.0)
MCHC: 32 g/dL (ref 30.0–36.0)
MCV: 89.2 fL (ref 78.0–100.0)
Platelets: 164 10*3/uL (ref 150–400)
RBC: 5.01 MIL/uL (ref 3.87–5.11)
RDW: 13.5 % (ref 11.5–15.5)
WBC: 6.8 10*3/uL (ref 4.0–10.5)

## 2017-11-03 LAB — PROTIME-INR
INR: 0.99
Prothrombin Time: 13 seconds (ref 11.4–15.2)

## 2017-11-03 MED ORDER — SODIUM CHLORIDE 0.9 % IV SOLN
INTRAVENOUS | Status: AC | PRN
  Administered 2017-11-03: 10 mL/h via INTRAVENOUS

## 2017-11-03 MED ORDER — SODIUM CHLORIDE 0.9 % IV SOLN: INTRAVENOUS | Status: DC

## 2017-11-03 MED ORDER — MIDAZOLAM HCL 2 MG/2ML IJ SOLN
INTRAMUSCULAR | Status: AC
  Filled 2017-11-03: qty 4

## 2017-11-03 MED ORDER — LIDOCAINE HCL 1 % IJ SOLN
INTRAMUSCULAR | Status: AC
Start: 2017-11-03 — End: 2017-11-03
  Filled 2017-11-03: qty 20

## 2017-11-03 MED ORDER — MIDAZOLAM HCL 2 MG/2ML IJ SOLN
INTRAMUSCULAR | Status: AC | PRN
  Administered 2017-11-03: 0.5 mg via INTRAVENOUS
  Administered 2017-11-03: 1 mg via INTRAVENOUS

## 2017-11-03 MED ORDER — FENTANYL CITRATE (PF) 100 MCG/2ML IJ SOLN
INTRAMUSCULAR | Status: AC
  Filled 2017-11-03: qty 4

## 2017-11-03 MED ORDER — FENTANYL CITRATE (PF) 100 MCG/2ML IJ SOLN
INTRAMUSCULAR | Status: AC | PRN
  Administered 2017-11-03: 50 ug via INTRAVENOUS

## 2017-11-03 NOTE — Sedation Documentation (Signed)
Patient is resting comfortably. 

## 2017-11-03 NOTE — Progress Notes (Signed)
Pt received 1.5 mg Versed IV for procedure. Wasted 1mg  versed but should have wasted 0.5mg  versed. Pharmacy notified.

## 2017-11-03 NOTE — Discharge Instructions (Signed)
Needle Biopsy, Care After °These instructions give you information about caring for yourself after your procedure. Your doctor may also give you more specific instructions. Call your doctor if you have any problems or questions after your procedure. °Follow these instructions at home: °· Rest as told by your doctor. °· Take medicines only as told by your doctor. °· There are many different ways to close and cover the biopsy site, including stitches (sutures), skin glue, and adhesive strips. Follow instructions from your doctor about: °? How to take care of your biopsy site. °? When and how you should change your bandage (dressing). °? When you should remove your dressing. °? Removing whatever was used to close your biopsy site. °· Check your biopsy site every day for signs of infection. Watch for: °? Redness, swelling, or pain. °? Fluid, blood, or pus. °Contact a doctor if: °· You have a fever. °· You have redness, swelling, or pain at the biopsy site, and it lasts longer than a few days. °· You have fluid, blood, or pus coming from the biopsy site. °· You feel sick to your stomach (nauseous). °· You throw up (vomit). °Get help right away if: °· You are short of breath. °· You have trouble breathing. °· Your chest hurts. °· You feel dizzy or you pass out (faint). °· You have bleeding that does not stop with pressure or a bandage. °· You cough up blood. °· Your belly (abdomen) hurts. °This information is not intended to replace advice given to you by your health care provider. Make sure you discuss any questions you have with your health care provider. °Document Released: 07/09/2008 Document Revised: 01/02/2016 Document Reviewed: 07/23/2014 °Elsevier Interactive Patient Education © 2018 [employer] ° °

## 2017-11-03 NOTE — H&P (Signed)
Chief Complaint: Patient was seen in consultation today for biopsy of abdominal wall nodule at the request of Phelps,Shawna B  Referring Physician(s): Phelps,Shawna B  Supervising Physician: Marybelle Killings  Patient Status: [facility] - Out-pt  History of Present Illness: Kristin Webb is a 72 y.o. female with hx of ovarian/fallopian tube cancer. She's had recent follow up imaging including PET scan which shows hypermetabolic nodules of the mesentery and abdominal wall. She is referred for image guided biopsy of the abdominal wall nodule. PMHx, meds, labs, imaging, allergies reviewed. Has been NPO this am. Feels well. Friend at bedside.  Past Medical History:  Diagnosis Date  . Burning mouth syndrome   . Chronic fatigue   . Constipation   . Diarrhea    in the past after gallbladder removal  . Fibromyalgia   . GERD (gastroesophageal reflux disease)   . History of kidney stones 07/2015  . Hyperlipidemia   . Hypertension    borderline not on meds   . Insomnia secondary to depression with anxiety   . Lumbar disc disease   . Ovarian cancer ([place]) 2014  . Pelvic mass in female   . Pneumonia    hx of pneumonia as an infant  . PONV (postoperative nausea and vomiting)   . Shortness of breath    with exertion   . Sleep apnea    ;neurologist informed pt she no longer needed her CPAP machine  . Yeast infection     Past Surgical History:  Procedure Laterality Date  . ABDOMINAL HYSTERECTOMY     early 73s  . APPENDECTOMY    . CHOLECYSTECTOMY     early 87s  . CYSTOSCOPY W/ URETERAL STENT PLACEMENT Left 07/09/2015   Procedure: CYSTOSCOPY WITH LEFT RETROGRADE PYELOGRAM/ LEFT URETERAL STENT PLACEMENT;  Surgeon: Ardis Hughs, MD;  Location: [facility] ORS;  Service: Urology;  Laterality: Left;  . EXPLORATORY LAPAROTOMY  11/08/12   BSO, appendectomy, omentectomy  . INCISIONAL HERNIA REPAIR N/A 12/25/2015   Procedure:  INCISIONAL HERNIA REPAIR;  Surgeon: Ralene Ok, MD;   Location: [facility];  Service: General;  Laterality: N/A;  . INSERTION OF MESH N/A 12/25/2015   Procedure: INSERTION OF MESH;  Surgeon: Ralene Ok, MD;  Location: [facility];  Service: General;  Laterality: N/A;  . LAPAROTOMY Bilateral 11/08/2012   Procedure: EXPLORATORY LAPAROTOMY TOTAL ABDOMINAL HYSTERECTOMY BILATERAL SALPINGO OOPHORECTOMY TUMOR DEBULKING ;  Surgeon: Imagene Gurney A. Alycia Rossetti, MD;  Location: [facility];  Service: Gynecology;  Laterality: Bilateral;  APPENDECTOMY / OMENTECTOMY  . LAPAROTOMY N/A 12/25/2015   Procedure: EXPLORATORY LAPAROTOMY;  Surgeon: Ralene Ok, MD;  Location: [facility];  Service: General;  Laterality: N/A;  . LYSIS OF ADHESION N/A 12/25/2015   Procedure: LYSIS OF ADHESION;  Surgeon: Ralene Ok, MD;  Location: [facility];  Service: General;  Laterality: N/A;  . TRIGGER FINGER RELEASE      Allergies: Ambien [zolpidem tartrate]; Bee venom; and Cortisone  Medications: Prior to Admission medications   Medication Sig Start Date End Date Taking? Authorizing Provider  acetaminophen (TYLENOL) 325 MG tablet Take 650 mg by mouth every 6 (six) hours as needed.    Yes [provider]  Armodafinil (NUVIGIL) 250 MG tablet Take one tab in the morning. (pt can also take 1/2 tab in the am and 1/2 tab at lunch if that is better) Patient taking differently: Take 250 mg by mouth daily.  09/08/17  Yes Dohmeier, Asencion Partridge, MD  colestipol (COLESTID) 1 g tablet Take 0.5 g by mouth daily. Pt  taking 1/2 to 1 tablet daily 07/24/15  Yes [provider]  diphenhydrAMINE (BENADRYL) 25 mg capsule Take 50 mg by mouth at bedtime.   Yes [provider]  EPINEPHrine (EPIPEN 2-PAK) 0.3 mg/0.3 mL DEVI Inject 0.3 mg into the muscle once as needed (For bee stings.). Reported on 01/20/2016   Yes [provider]  eszopiclone (LUNESTA) 2 MG TABS tablet Take 1 tablet (2 mg total) at bedtime as needed by mouth for sleep. Take immediately before bedtime Patient taking differently: Take 2  mg by mouth at bedtime. Take immediately before bedtime 06/23/17  Yes Dohmeier, Asencion Partridge, MD  FLUoxetine (PROZAC) 10 MG capsule Take 10 mg by mouth daily. Takes with a 20mg  capsule for her total dose of 30mg .   Yes [provider]  FLUoxetine (PROZAC) 20 MG capsule Take 20 mg by mouth daily. Takes with a 10mg  capsule for her total dose of 30mg . 07/12/14  Yes [provider]  ibuprofen (ADVIL,MOTRIN) 200 MG tablet Take 400-600 mg by mouth every 6 (six) hours as needed for mild pain or moderate pain.   Yes [provider]  Multiple Vitamin (MULTIVITAMIN WITH MINERALS) TABS Take 1 tablet by mouth daily.   Yes [provider]  rosuvastatin (CRESTOR) 10 MG tablet Take 10 mg by mouth at bedtime.    Yes [provider]  triamterene-hydrochlorothiazide (MAXZIDE-25) 37.5-25 MG tablet Take 1 tablet by mouth daily. 07/13/15  Yes Florencia Reasons, MD     Family History  Problem Relation Age of Onset  . Lung cancer Mother 37  . High blood pressure Mother   . High Cholesterol Mother   . Lung cancer Father 62       2 ppd smoker  . Parkinson's disease Father   . Kidney disease Unknown     Social History   Socioeconomic History  . Marital status: Widowed    Spouse name: [name]  . Number of children: 2  . Years of education: Master's  . Highest education level: Not on file  Occupational History  . Not on file  Social Needs  . Financial resource strain: Not on file  . Food insecurity:    Worry: Not on file    Inability: Not on file  . Transportation needs:    Medical: Not on file    Non-medical: Not on file  Tobacco Use  . Smoking status: Never Smoker  . Smokeless tobacco: Never Used  Substance and Sexual Activity  . Alcohol use: No  . Drug use: No  . Sexual activity: Not on file  Lifestyle  . Physical activity:    Days per week: Not on file    Minutes per session: Not on file  . Stress: Not on file  Relationships  . Social connections:    Talks on  phone: Not on file    Gets together: Not on file    Attends religious service: Not on file    Active member of club or organization: Not on file    Attends meetings of clubs or organizations: Not on file    Relationship status: Not on file  Other Topics Concern  . Not on file  Social History Narrative   Patient is married [name]) and lives at home with her husband.   Patient has 2 children by birth and 13 children all together.   Patient has a Oceanographer.   Patient is right-handed.   Patient drinks very little caffeine.  Review of Systems: A 12 point ROS discussed and pertinent positives are indicated in the HPI above.  All other systems are negative.  Review of Systems  Vital Signs: BP 114/68 (BP Location: Right Arm)   Pulse 79   Temp 97.7 F (36.5 C) (Oral)   Ht 5' (1.524 m)   Wt 200 lb (90.7 kg)   SpO2 99%   BMI 39.06 kg/m   Physical Exam  Constitutional: She is oriented to person, place, and time. She appears well-developed. No distress.  HENT:  Head: Normocephalic.  Mouth/Throat: Oropharynx is clear and moist.  Neck: Normal range of motion. No JVD present. No tracheal deviation present.  Cardiovascular: Normal rate, regular rhythm and normal heart sounds.  Pulmonary/Chest: Effort normal and breath sounds normal. No respiratory distress.  Abdominal: Soft. She exhibits no mass. There is no tenderness.  Neurological: She is alert and oriented to person, place, and time.  Skin: Skin is warm and dry.  Psychiatric: She has a normal mood and affect.    Imaging: Nm Pet Image Initial (pi) Skull Base To Thigh  Result Date: 10/11/2017 CLINICAL DATA:  Initial treatment strategy for ovarian/fallopian tube cancer. EXAM: NUCLEAR MEDICINE PET SKULL BASE TO THIGH TECHNIQUE: 9.9 mCi F-18 FDG was injected intravenously. Full-ring PET imaging was performed from the skull base to thigh after the radiotracer. CT data was obtained and used for attenuation correction and  anatomic localization. Fasting blood glucose: 118 mg/dl Mediastinal blood pool activity: SUV max 3.3 COMPARISON:  CT abdomen pelvis 09/30/2017. FINDINGS: NECK: No hypermetabolic lymph nodes in the neck. Incidental CT findings: None. CHEST: No hypermetabolic mediastinal, hilar or axillary lymph nodes. No hypermetabolic pulmonary nodules. Incidental CT findings: Right IJ Port-A-Cath terminates in the low SVC. No pericardial or pleural effusion. ABDOMEN/PELVIS: Hypermetabolic nodules in the sigmoid mesentery measure up to 1.5 cm (CT image 158) with an SUV max of 9.1. No abnormal hypermetabolism in the liver, adrenal glands, spleen or pancreas. No hypermetabolic lymph nodes. Incidental CT findings: Liver appears fatty. 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 Mild mesenteric haziness and nodularity, as before. Midline ventral hernia contains unobstructed colon. No free fluid. SKELETON: No abnormal osseous hypermetabolism. Incidental CT findings: Degenerative changes in the spine. IMPRESSION: 1. Nodules in the sigmoid mesentery are hypermetabolic and highly worrisome for metastatic disease. 2. Attic steatosis. Electronically Signed   By: Lorin Picket M.D.   On: 10/11/2017 09:50    Labs:  CBC: No results for input(s): WBC, HGB, HCT, PLT in the last 8760 hours.  COAGS: No results for input(s): INR, APTT in the last 8760 hours.  BMP: Recent Labs    12/02/16 1219 09/28/17 1118  NA 141 139  K 3.7 3.9  CL  --  100  CO2 27 29  GLUCOSE 98 107  BUN 15.3 16  CALCIUM 10.1 10.0  CREATININE 0.7 0.81  GFRNONAA  --  >60  GFRAA  --  >60    LIVER FUNCTION TESTS: Recent Labs    12/02/16 1219  BILITOT 0.37  AST 21  ALT 27  ALKPHOS 104  PROT 7.1  ALBUMIN 4.2    TUMOR MARKERS: No results for input(s): AFPTM, CEA, CA199, CHROMGRNA in the last 8760 hours.  Assessment and Plan: Hypermetabolic abdominal wall mass. Hx of ovarian/fallopian tube cancer For CT guided biopsy. Labs  pending Risks and benefits discussed with the patient including, but not limited to bleeding, infection, damage to adjacent structures or low yield requiring additional tests.  All of the patient's  questions were answered, patient is agreeable to proceed. Consent signed and in chart.    Thank you for this interesting consult.  I greatly enjoyed meeting Evonne Rinks and look forward to participating in their care.  A copy of this report was sent to the requesting provider on this date.  Electronically Signed: Ascencion Dike, PA-C 11/03/2017, 10:37 AM   I spent a total of 20 minutes in face to face in clinical consultation, greater than 50% of which was counseling/coordinating care for abdominal wall nodule biopsy

## 2017-11-03 NOTE — Procedures (Signed)
R abdominal wall mass Bx 18 g times three EBL 0 Comp 0

## 2017-11-04 ENCOUNTER — Ambulatory Visit: Payer: Medicare HMO | Admitting: Hematology and Oncology

## 2017-11-04 ENCOUNTER — Telehealth: Payer: Self-pay | Admitting: *Deleted

## 2017-11-04 NOTE — Telephone Encounter (Signed)
Called and cancelled the appt for today with Dr. Alvy Bimler. Advised the patient that the results were not back yet, that we will cll her back with a new appt

## 2017-11-15 ENCOUNTER — Telehealth: Payer: Self-pay | Admitting: Gynecologic Oncology

## 2017-11-15 NOTE — Telephone Encounter (Signed)
Returned call to patient.  Discussed biopsy results.  All questions answered.  She is to follow up on April 15 with Dr. Gerarda Fraction to discuss next steps.  No concerns voiced.  Patient is currently fighting a cold.  Advised to call for any needs.

## 2017-11-16 DIAGNOSIS — G4733 Obstructive sleep apnea (adult) (pediatric): Secondary | ICD-10-CM | POA: Diagnosis not present

## 2017-11-22 ENCOUNTER — Telehealth: Payer: Self-pay

## 2017-11-22 ENCOUNTER — Inpatient Hospital Stay: Payer: Medicare HMO

## 2017-11-22 ENCOUNTER — Encounter: Payer: Self-pay | Admitting: Obstetrics

## 2017-11-22 ENCOUNTER — Inpatient Hospital Stay: Payer: Medicare HMO | Attending: Gynecologic Oncology | Admitting: Obstetrics

## 2017-11-22 VITALS — BP 137/88 | HR 84 | Temp 98.2°F | Resp 18 | Wt 201.0 lb

## 2017-11-22 DIAGNOSIS — E559 Vitamin D deficiency, unspecified: Secondary | ICD-10-CM | POA: Insufficient documentation

## 2017-11-22 DIAGNOSIS — Z90710 Acquired absence of both cervix and uterus: Secondary | ICD-10-CM | POA: Insufficient documentation

## 2017-11-22 DIAGNOSIS — Z8544 Personal history of malignant neoplasm of other female genital organs: Secondary | ICD-10-CM | POA: Insufficient documentation

## 2017-11-22 DIAGNOSIS — C5701 Malignant neoplasm of right fallopian tube: Secondary | ICD-10-CM

## 2017-11-22 DIAGNOSIS — M549 Dorsalgia, unspecified: Secondary | ICD-10-CM | POA: Diagnosis not present

## 2017-11-22 DIAGNOSIS — Z90722 Acquired absence of ovaries, bilateral: Secondary | ICD-10-CM | POA: Diagnosis not present

## 2017-11-22 DIAGNOSIS — Z9221 Personal history of antineoplastic chemotherapy: Secondary | ICD-10-CM | POA: Diagnosis not present

## 2017-11-22 LAB — BASIC METABOLIC PANEL
Anion gap: 11 (ref 3–11)
BUN: 13 mg/dL (ref 7–26)
CO2: 28 mmol/L (ref 22–29)
Calcium: 10.3 mg/dL (ref 8.4–10.4)
Chloride: 102 mmol/L (ref 98–109)
Creatinine, Ser: 0.78 mg/dL (ref 0.60–1.10)
GFR calc Af Amer: >60 mL/min (ref >60)
GFR calc non Af Amer: >60 mL/min (ref >60)
Glucose, Bld: 105 mg/dL (ref 70–140)
Potassium: 3.8 mmol/L (ref 3.5–5.1)
Sodium: 141 mmol/L (ref 136–145)

## 2017-11-22 NOTE — Telephone Encounter (Signed)
Per Lenna Sciara NP, pt wanted Vitamin D Level drawn today- Melissa NP will order but I contacted pt to find out reason she wanted would like it ordered.  Pt reports she had a very low Vitamin D Level in the Winter 2019 and had to take prescription Vitamin D for 12 weeks per her PCP. She would like to see if her Vitamin D Level has increased yet or is still low.  Notified Melissa NP.

## 2017-11-22 NOTE — Patient Instructions (Signed)
Preparing for your Surgery  Plan for surgery on November 25, 2017 with Dr. Precious Haws at [facility] will be scheduled for a diagnostic laparoscopy, possible exploratory laparotomy, possible debulking.  Pre-operative Testing -You will receive a phone call from presurgical testing at [facility] to arrange for a pre-operative testing appointment before your surgery.  This appointment normally occurs one to two weeks before your scheduled surgery.   -Bring your insurance card, copy of an advanced directive if applicable, medication list  -At that visit, you will be asked to sign a consent for a possible blood transfusion in case a transfusion becomes necessary during surgery.  The need for a blood transfusion is rare but having consent is a necessary part of your care.     -You should not be taking blood thinners or aspirin at least ten days prior to surgery unless instructed by your surgeon.  Day Before Surgery at [place] will be asked to take in a light diet the day before surgery.  Avoid carbonated beverages.  You will be advised to have nothing to eat or drink after midnight the evening before.    Eat a light diet the day before surgery.  Examples including soups, broths, toast, yogurt, mashed potatoes.  Things to avoid include carbonated beverages (fizzy beverages), raw fruits and raw vegetables, or beans.   If your bowels are filled with gas, your surgeon will have difficulty visualizing your pelvic organs which increases your surgical risks.  Your role in recovery Your role is to become active as soon as directed by your doctor, while still giving yourself time to heal.  Rest when you feel tired. You will be asked to do the following in order to speed your recovery:  - Cough and breathe deeply. This helps toclear and expand your lungs and can prevent pneumonia. You may be given a spirometer to practice deep breathing. A staff member will show you  how to use the spirometer. - Do mild physical activity. Walking or moving your legs help your circulation and body functions return to normal. A staff member will help you when you try to walk and will provide you with simple exercises. Do not try to get up or walk alone the first time. - Actively manage your pain. Managing your pain lets you move in comfort. We will ask you to rate your pain on a scale of zero to 10. It is your responsibility to tell your doctor or nurse where and how much you hurt so your pain can be treated.  Special Considerations -If you are diabetic, you may be placed on insulin after surgery to have closer control over your blood sugars to promote healing and recovery.  This does not mean that you will be discharged on insulin.  If applicable, your oral antidiabetics will be resumed when you are tolerating a solid diet.  -Your final pathology results from surgery should be available by the Friday after surgery and the results will be relayed to you when available.  -Dr. Lahoma Crocker is the Surgeon that assists your GYN Oncologist with surgery.  The next day after your surgery you will either see your GYN Oncologist or Dr. Lahoma Crocker.   Blood Transfusion Information WHAT IS A BLOOD TRANSFUSION? A transfusion is the replacement of blood or some of its parts. Blood is made up of multiple cells which provide different functions.  Red blood cells carry oxygen and are used for blood loss replacement.  White blood cells fight against infection.  Platelets control bleeding.  Plasma helps clot blood.  Other blood products are available for specialized needs, such as hemophilia or other clotting disorders. BEFORE THE TRANSFUSION  Who gives blood for transfusions?   You may be able to donate blood to be used at a later date on yourself (autologous donation).  Relatives can be asked to donate blood. This is generally not any safer than if you have received  blood from a stranger. The same precautions are taken to ensure safety when a relative's blood is donated.  Healthy volunteers who are fully evaluated to make sure their blood is safe. This is blood bank blood. Transfusion therapy is the safest it has ever been in the practice of medicine. Before blood is taken from a donor, a complete history is taken to make sure that person has no history of diseases nor engages in risky social behavior (examples are intravenous drug use or sexual activity with multiple partners). The donor's travel history is screened to minimize risk of transmitting infections, such as malaria. The donated blood is tested for signs of infectious diseases, such as HIV and hepatitis. The blood is then tested to be sure it is compatible with you in order to minimize the chance of a transfusion reaction. If you or a relative donates blood, this is often done in anticipation of surgery and is not appropriate for emergency situations. It takes many days to process the donated blood. RISKS AND COMPLICATIONS Although transfusion therapy is very safe and saves many lives, the main dangers of transfusion include:   Getting an infectious disease.  Developing a transfusion reaction. This is an allergic reaction to something in the blood you were given. Every precaution is taken to prevent this. The decision to have a blood transfusion has been considered carefully by your caregiver before blood is given. Blood is not given unless the benefits outweigh the risks.

## 2017-11-22 NOTE — Patient Instructions (Addendum)
Kristin Webb  11/22/2017   Your procedure is scheduled on: 11-25-17   Report to [facility] Main  Entrance Report to Admitting at 8:45 AM    Call this number if you have problems the morning of surgery [phone]   Remember: Do not eat food or drink liquids :After Midnight.   Eat a light diet the day before surgery.  Examples including soups, broths, toast, yogurt, mashed potatoes.  Things to avoid include carbonated beverages (fizzy beverages), raw fruits and raw vegetables, or beans.   If your bowels are filled with gas, your surgeon will have difficulty visualizing your pelvic organs which increases your surgical risks.   Take these medicines the morning of surgery with A SIP OF WATER: Fluoxetine (Prozac)                                You may not have any metal on your body including hair pins and              piercings  Do not wear jewelry, make-up, lotions, powders or perfumes, deodorant             Do not wear nail polish.  Do not shave  48 hours prior to surgery.                Do not bring valuables to the hospital. [name]  Contacts, dentures or bridgework may not be worn into surgery.  Leave suitcase in the car. After surgery it may be brought to your room.     Patients discharged the day of surgery will not be allowed to drive home.  Special Instructions: Cough and Deep Breath & Leg Exercises. Please bring your mask and tubing for your CPAP Machine              Please read over the following fact sheets you were given: _____________________________________________________________________             [facility] - Preparing for Surgery Before surgery, you can play an important role.  Because skin is not sterile, your skin needs to be as free of germs as possible.  You can reduce the number of germs on your skin by washing with CHG (chlorahexidine gluconate) soap before surgery.  CHG is an  antiseptic cleaner which kills germs and bonds with the skin to continue killing germs even after washing. Please DO NOT use if you have an allergy to CHG or antibacterial soaps.  If your skin becomes reddened/irritated stop using the CHG and inform your nurse when you arrive at Short Stay. Do not shave (including legs and underarms) for at least 48 hours prior to the first CHG shower.  You may shave your face/neck. Please follow these instructions carefully:  1.  Shower with CHG Soap the night before surgery and the  morning of Surgery.  2.  If you choose to wash your hair, wash your hair first as usual with your  normal  shampoo.  3.  After you shampoo, rinse your hair and body thoroughly to remove the  shampoo.                           4.  Use CHG as you would any other liquid soap.  You can apply chg directly  to the skin and wash                       Gently with a scrungie or clean washcloth.  5.  Apply the CHG Soap to your body ONLY FROM THE NECK DOWN.   Do not use on face/ open                           Wound or open sores. Avoid contact with eyes, ears mouth and genitals (private parts).                       Wash face,  Genitals (private parts) with your normal soap.             6.  Wash thoroughly, paying special attention to the area where your surgery  will be performed.  7.  Thoroughly rinse your body with warm water from the neck down.  8.  DO NOT shower/wash with your normal soap after using and rinsing off  the CHG Soap.                9.  Pat yourself dry with a clean towel.            10.  Wear clean pajamas.            11.  Place clean sheets on your bed the night of your first shower and do not  sleep with pets. Day of Surgery : Do not apply any lotions/deodorants the morning of surgery.  Please wear clean clothes to the hospital/surgery center.  FAILURE TO FOLLOW THESE INSTRUCTIONS MAY RESULT IN THE CANCELLATION OF YOUR SURGERY PATIENT  SIGNATURE_________________________________  NURSE SIGNATURE__________________________________  ________________________________________________________________________   [name]  An incentive spirometer is a tool that can help keep your lungs clear and active. This tool measures how well you are filling your lungs with each breath. Taking long deep breaths may help reverse or decrease the chance of developing breathing (pulmonary) problems (especially infection) following:  A long period of time when you are unable to move or be active. BEFORE THE PROCEDURE   If the spirometer includes an indicator to show your best effort, your nurse or respiratory therapist will set it to a desired goal.  If possible, sit up straight or lean slightly forward. Try not to slouch.  Hold the incentive spirometer in an upright position. INSTRUCTIONS FOR USE  1. Sit on the edge of your bed if possible, or sit up as far as you can in bed or on a chair. 2. Hold the incentive spirometer in an upright position. 3. Breathe out normally. 4. Place the mouthpiece in your mouth and seal your lips tightly around it. 5. Breathe in slowly and as deeply as possible, raising the piston or the ball toward the top of the column. 6. Hold your breath for 3-5 seconds or for as long as possible. Allow the piston or ball to fall to the bottom of the column. 7. Remove the mouthpiece from your mouth and breathe out normally. 8. Rest for a few seconds and repeat Steps 1 through 7 at least 10 times every 1-2 hours when you are awake. Take your time and take a few normal breaths between deep breaths. 9. The spirometer may include an indicator to show your best  effort. Use the indicator as a goal to work toward during each repetition. 10. After each set of 10 deep breaths, practice coughing to be sure your lungs are clear. If you have an incision (the cut made at the time of surgery), support your incision when coughing  by placing a pillow or rolled up towels firmly against it. Once you are able to get out of bed, walk around indoors and cough well. You may stop using the incentive spirometer when instructed by your caregiver.  RISKS AND COMPLICATIONS  Take your time so you do not get dizzy or light-headed.  If you are in pain, you may need to take or ask for pain medication before doing incentive spirometry. It is harder to take a deep breath if you are having pain. AFTER USE  Rest and breathe slowly and easily.  It can be helpful to keep track of a log of your progress. Your caregiver can provide you with a simple table to help with this. If you are using the spirometer at home, follow these instructions: [place] IF:   You are having difficultly using the spirometer.  You have trouble using the spirometer as often as instructed.  Your pain medication is not giving enough relief while using the spirometer.  You develop fever of 100.5 F (38.1 C) or higher. SEEK IMMEDIATE MEDICAL CARE IF:   You cough up bloody sputum that had not been present before.  You develop fever of 102 F (38.9 C) or greater.  You develop worsening pain at or near the incision site. MAKE SURE YOU:   Understand these instructions.  Will watch your condition.  Will get help right away if you are not doing well or get worse. Document Released: 12/07/2006 Document Revised: 10/19/2011 Document Reviewed: 02/07/2007 [employer] Patient Information 2014 [employer], [place].   ________________________________________________________________________  WHAT IS A BLOOD TRANSFUSION? Blood Transfusion Information  A transfusion is the replacement of blood or some of its parts. Blood is made up of multiple cells which provide different functions.  Red blood cells carry oxygen and are used for blood loss replacement.  White blood cells fight against infection.  Platelets control bleeding.  Plasma helps clot  blood.  Other blood products are available for specialized needs, such as hemophilia or other clotting disorders. BEFORE THE TRANSFUSION  Who gives blood for transfusions?   Healthy volunteers who are fully evaluated to make sure their blood is safe. This is blood bank blood. Transfusion therapy is the safest it has ever been in the practice of medicine. Before blood is taken from a donor, a complete history is taken to make sure that person has no history of diseases nor engages in risky social behavior (examples are intravenous drug use or sexual activity with multiple partners). The donor's travel history is screened to minimize risk of transmitting infections, such as malaria. The donated blood is tested for signs of infectious diseases, such as HIV and hepatitis. The blood is then tested to be sure it is compatible with you in order to minimize the chance of a transfusion reaction. If you or a relative donates blood, this is often done in anticipation of surgery and is not appropriate for emergency situations. It takes many days to process the donated blood. RISKS AND COMPLICATIONS Although transfusion therapy is very safe and saves many lives, the main dangers of transfusion include:   Getting an infectious disease.  Developing a transfusion reaction. This is an allergic reaction to something in  the blood you were given. Every precaution is taken to prevent this. The decision to have a blood transfusion has been considered carefully by your caregiver before blood is given. Blood is not given unless the benefits outweigh the risks. AFTER THE TRANSFUSION  Right after receiving a blood transfusion, you will usually feel much better and more energetic. This is especially true if your red blood cells have gotten low (anemic). The transfusion raises the level of the red blood cells which carry oxygen, and this usually causes an energy increase.  The nurse administering the transfusion will monitor  you carefully for complications. HOME CARE INSTRUCTIONS  No special instructions are needed after a transfusion. You may find your energy is better. Speak with your caregiver about any limitations on activity for underlying diseases you may have. SEEK MEDICAL CARE IF:   Your condition is not improving after your transfusion.  You develop redness or irritation at the intravenous (IV) site. SEEK IMMEDIATE MEDICAL CARE IF:  Any of the following symptoms occur over the next 12 hours:  Shaking chills.  You have a temperature by mouth above 102 F (38.9 C), not controlled by medicine.  Chest, back, or muscle pain.  People around you feel you are not acting correctly or are confused.  Shortness of breath or difficulty breathing.  Dizziness and fainting.  You get a rash or develop hives.  You have a decrease in urine output.  Your urine turns a dark color or changes to pink, red, or brown. Any of the following symptoms occur over the next 10 days:  You have a temperature by mouth above 102 F (38.9 C), not controlled by medicine.  Shortness of breath.  Weakness after normal activity.  The white part of the eye turns yellow (jaundice).  You have a decrease in the amount of urine or are urinating less often.  Your urine turns a dark color or changes to pink, red, or brown. Document Released: 07/24/2000 Document Revised: 10/19/2011 Document Reviewed: 03/12/2008 [facility] Patient Information 2014 [place], [place].  _______________________________________________________________________

## 2017-11-22 NOTE — Progress Notes (Signed)
Progress Note: Gyn-Onc  CC:  Chief Complaint  Patient presents with  . Carcinoma of right fallopian tube Kristin Webb)    HPI: Ms. Kristin Webb  is a very nice 72 y.o.  year old P2   Interval History:  She has been followed in surveillance by [name]. CA125 was noted in Jan 2019 to go from single digit baseline to the 20's. Repeat in 09/24/2017 confirmed at 27.  Imaging was recommended including a CT and a PET scan.   The CT 09/30/17 revealed : new clustered soft tissue nodularity in the left lower quadrant in the sigmoid mesentery with a dominant 1.0 cm nodule (series 2/image 73).    PET 10/11/17 revealed : Hypermetabolic nodules in the sigmoid mesentery measure up to 1.5 cm (CT image 158) with an SUV max of 9.1. No abnormal hypermetabolism in the liver, adrenal glands, spleen or pancreas. No hypermetabolic lymph nodes.  She denies any symptoms concerning for recurrence.  She does however note some back pain that radiates around to her front; mid back pain. She denies nausea vomiting and changes in bowel movement.  Since my first meeting her (10/27/17), before her trip to [facility] with her grandkids, we talked about options including biopsy of the anterior abdominal wall versus going to surgery . That has since been performed (11/03/17) and negative, despite having an elevated SUV on PET. There are no corresponding cine clips or images I can review to determine the biopsy location.      --> BIOPSY 11/03/17 FOREIGN BODY GIANT CELL REACTION INVOLVING FIBROADIPOSE TISSUE AND SKELETAL MUSCLE  She returns today to discuss next steps. She is still feeling well. Most complaints are chronic. The only new symptom being some intermittent mild heartburn.    Initial Presentation: She noticed increasing abdominal swelling around the third week of January 2014. Initially, the abdominal swelling was intermittent and would come and go. She related this to initiation of a calcium supplement. The abdominal swelling  became more prevalent starting in February with abdominal pain reported also. In addition, she started having increasing constipation and formed stools, which was a change in her routine of 5-6 loose bowel movements a day chronically after her cholecystectomy.  On November 01, 2012, she had a CT scan of the abdomen and pelvis revealing a large midabdominal mass measuring 17 x 22.7 cm x 20 cm.  On November 08, 2012, she underwent an exploratory laparotomy, BSO, appendectomy, infracolic omentectomy, and optimal debulking. Operative findings included: 25 cm right adnexal mass with smooth surface. Surgically absent uterus. Atrophic-appearing left ovary. Normal appearing appendix. Within the omentum there were centimeter nodules scattered throughout the omentum. The remainder of the surfaces were benign. Final pathology revealed:  1. 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 - BENIGN FALLOPIAN TUBE WITH NONSPECIFIC CHRONIC INFLAMMATION. 2. Ovary and fallopian tube, left - BENIGN OVARY; NEGATIVE FOR ATYPIA OR MALIGNANCY. - BENIGN FALLOPIAN TUBE; NEGATIVE FOR ATYPIA OR MALIGNANCY. 3. Omentum, resection for tumor - HIGH GRADE CARCINOMA, SEE COMMENT. 4. Appendix, Other than Incidental - FIBROUS OBLITERATION OF APPENDICEAL TIP. - NEGATIVE FOR MALIGNANCY.   She completed cycle 6 of 6 planned treatments of carboplatin and taxol on 03/28/13.    Measurement of disease:   Recent Labs    12/02/16 1219 02/24/17 1320 08/25/17 1034 09/24/17 0956 11/22/17 1017  CAN125 8.0 9.3 20.3 27.1 10.1  Radiology: Ct Biopsy  Result Date: 11/03/2017 INDICATION: Rectus abdominus muscle mass EXAM: CT BIOPSY MEDICATIONS: None. ANESTHESIA/SEDATION: Fentanyl 50 mcg IV; Versed 1.5 mg IV Moderate Sedation Time:  10 minutes The patient was continuously monitored during the procedure  by the interventional radiology nurse under my direct supervision. FLUOROSCOPY TIME:  Fluoroscopy Time:  minutes  seconds ( mGy). COMPLICATIONS: None immediate. PROCEDURE: Informed written consent was obtained from the patient after a thorough discussion of the procedural risks, benefits and alternatives. All questions were addressed. Maximal Sterile Barrier Technique was utilized including caps, mask, sterile gowns, sterile gloves, sterile drape, hand hygiene and skin antiseptic. A timeout was performed prior to the initiation of the procedure. Under CT guidance, a(n) 17 gauge guide needle was advanced into the right lower abdominal rectus muscle mass. Subsequently 3 18 gauge core biopsies were obtained. The guide needle was removed. Post biopsy images demonstrate no hemorrhage. Patient tolerated the procedure well without complication. Vital sign monitoring by nursing staff during the procedure will continue as patient is in the special procedures unit for post procedure observation. FINDINGS: The images document guide needle placement within the rectus abdominus muscle mass. Post biopsy images demonstrate no hemorrhage. IMPRESSION: Successful CT-guided rectus abdominal muscle mass core biopsy. Electronically Signed   By: Marybelle Killings M.D.   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 negatve post treatmen CT 9/14       Fallopian tube carcinoma ([place])   11/01/2012 Imaging    Ct abdomen 1.  Interval development of large mid abdominal mass highly concerning for right ovarian cancer.  There is mild omental nodularity on the left, and  peritoneal disease cannot be completely excluded.  There is no ascites or other evidence of metastatic disease. 2.  Mild associated renal pelvocaliectasis bilaterally without obstruction.  Nonobstructing left renal calculus and a small right renal angiomyolipoma noted incidentally.      11/08/2012 Pathology Results    1. 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 - BENIGN FALLOPIAN TUBE WITH NONSPECIFIC CHRONIC INFLAMMATION. 2. Ovary and fallopian tube, left - BENIGN OVARY; NEGATIVE FOR ATYPIA OR MALIGNANCY. - BENIGN FALLOPIAN TUBE; NEGATIVE FOR ATYPIA OR MALIGNANCY. 3. Omentum, resection for tumor - HIGH GRADE CARCINOMA, SEE COMMENT. 4. Appendix, Other than Incidental - FIBROUS OBLITERATION OF APPENDICEAL TIP. - NEGATIVE FOR MALIGNANCY.      11/08/2012 Surgery    Surgery: Exploratory laparotomy, bilateral salpingo-oophorectomy, appendectomy, infacolic omentectomy, optimal debulking  Surgeons:  Paola A. Alycia Rossetti, MD; Lahoma Crocker, MD   Assistant: Caswell Corwin  Pathology: Bilateral fallopian tubes and ovaries to pathology. Appendix as well as omentum. 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  Operative findings: 25 cm right adnexal mass with smooth surface. Surgically absent uterus. Atrophic-appearing left ovary. Normal appearing appendix. Within the omentum there were centimeter nodules scattered throughout the omentum. The remainder  of the surfaces were benign.      12/08/2012 Procedure    Impression:  Placement of a subcutaneous port device.  The catheter tip is in the lower SVC and ready to be used.       12/13/2012 - 03/28/2013 Chemotherapy    s/p 6 cycles of paclitaxel and carboplatin      12/13/2012 - 03/28/2013 Chemotherapy    The patient  had 6 cycles of carboplatin and Taxol      03/24/2013 Imaging    US abdomen      04/24/2013 Imaging    CT abdomen Interval resection of the large right pelvic and lower abdominal mass lesion with apparent omentectomy.  No evidence for intraperitoneal free fluid on today's study.  No discernible peritoneal lesions.  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      09/07/2013 Genetic Testing    Patient has genetic testing done for BRCA1/2 panel Results revealed patient has no mutation(s):      07/09/2015 Imaging    CT abdomen 1. 12 mm obstructive calculus at the left ureteropelvic junction with moderate proximal hydronephrosis. 2. 2 small supraumbilical ventral hernias, one containing a short segment of the mid transverse colon and the other containing a short segment of the mid small bowel. There is no associated evidence to suggest bowel incarceration or obstruction at this time. 3. Tiny locule of gas non dependently in the lumen of the urinary bladder. This is presumably iatrogenic related to recent catheterization for urinalysis. Alternatively, this could be seen in the setting of urinary tract infection with gas-forming organisms. Clinical correlation for history of recent catheterization is recommended. 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 5. Status post cholecystectomy. 6. Additional incidental findings, as above.       12/11/2016 Imaging    Ct abdomen 1. No evidence of metastatic ovarian cancer. 2. Recurrent subxiphoid ventral abdominal wall hernia containing transverse colon. No evidence of incarceration or obstruction. 3. Stable incidental findings in the liver and kidneys. No recurrent urinary tract calculus. 4. Progressive lower lumbar spondylosis. 5.  Aortic Atherosclerosis (ICD10-I70.0).       08/30/2017 Imaging    MRI thoracic spine 1. At T5-6 there is a small central disc protrusion contacting the ventral thoracic spinal cord. No  central canal or foraminal stenosis. 2. At T9-10 there is a small right paracentral disc protrusion. 3.  No acute osseous injury of the thoracic spine. 4. No aggressive osseous lesion to suggest metastatic disease.      09/24/2017 Tumor Marker    Patient's tumor was tested for the following markers: CA-125 Results of the tumor marker test revealed 21.7      09/30/2017 Imaging    CT abdomen 1. New small clustered soft tissue nodules in the left lower quadrant in the sigmoid mesentery, largest 1.0 cm, which could represent recurrent peritoneal tumor implants. No ascites.  2. Midline high ventral abdominal wall hernia containing a portion of the transverse colon is mildly increased in size, and without bowel complication at this time. 3. Chronic findings include: Aortic Atherosclerosis (ICD10-I70.0). Diffuse hepatic steatosis. 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      10/11/2017 PET scan    1. Nodules in the sigmoid mesentery are hypermetabolic and highly worrisome for metastatic disease. 2. Attic steatosis.       11/03/2017 Procedure    Successful CT-guided rectus abdominal muscle mass core biopsy.      11/04/2017  Cancer Staging    Staging form: Fallopian Tube, AJCC 7th Edition - Clinical: FIGO Stage IIIC, calculated as Stage III (T3, N0, M0) - Signed by [name], MD on 11/04/2017       Current Meds:  Outpatient Encounter Medications as of 11/22/2017  Medication Sig  . acetaminophen (TYLENOL) 325 MG tablet Take 650 mg by mouth every 6 (six) hours as needed.   . Armodafinil (NUVIGIL) 250 MG tablet Take one tab in the morning. (pt can also take 1/2 tab in the am and 1/2 tab at lunch if that is better) (Patient taking differently: Take 250 mg by mouth daily. )  . colestipol (COLESTID) 1 g tablet Take 0.5 g by mouth daily. Pt taking 1/2 to 1 tablet daily  . diphenhydrAMINE (BENADRYL) 25 mg capsule Take 50 mg by mouth at bedtime.   Marland Kitchen EPINEPHrine (EPIPEN 2-PAK) 0.3 mg/0.3 mL DEVI Inject 0.3 mg into the muscle once as needed (For bee stings.). Reported on 01/20/2016  . eszopiclone (LUNESTA) 2 MG TABS tablet Take 1 tablet (2 mg total) at bedtime as needed by mouth for sleep. Take immediately before bedtime (Patient taking differently: Take 2 mg by mouth at bedtime. Take immediately before bedtime)  . FLUoxetine (PROZAC) 10 MG capsule Take 10 mg by mouth daily. Takes with a 48m capsule for her total dose of 369m  . Marland KitchenLUoxetine (PROZAC) 20 MG capsule Take 20 mg by mouth daily. Takes with a 1080mapsule for her total dose of 71m79m. ibMarland Kitchenprofen (ADVIL,MOTRIN) 200 MG tablet Take 400-600 mg by mouth every 6 (six) hours as needed for mild pain or moderate pain.  . Multiple Vitamin (MULTIVITAMIN WITH MINERALS) TABS Take 1 tablet by mouth daily.  . rosuvastatin (CRESTOR) 10 MG tablet Take 10 mg by mouth at bedtime.   . triamterene-hydrochlorothiazide (MAXZIDE-25) 37.5-25 MG tablet Take 1 tablet by mouth daily.   Facility-Administered Encounter Medications as of 11/22/2017  Medication  . sodium chloride flush (NS) 0.9 % injection 10 mL    Allergy:  Allergies  Allergen Reactions  . Ambien [Zolpidem Tartrate] Other (See Comments)    Sleep walking  . Bee Venom Hives    Difficulty breathing, carries an EPI-pen  . Cortisone Nausea And Vomiting, Swelling and Other (See Comments)    INJECTED CORTISONE ONLY, "sick to my stomach, throwing up, hand swelling, and pain."    Social Hx:   Social History   Socioeconomic History  . Marital status: Widowed    Spouse name: BernIlona SorrelNumber of children: 2  . Years of education: Master's  . Highest education level: Not on file  Occupational History  . Not on file  Social Needs  . Financial resource strain: Not on file  . Food insecurity:    Worry: Not on file    Inability: Not on file  . Transportation needs:    Medical: Not on file    Non-medical: Not on file  Tobacco Use  .  Smoking status: Never Smoker  . Smokeless tobacco: Never Used  Substance and Sexual Activity  . Alcohol use: No  . Drug use: No  . Sexual activity: Not on file  Lifestyle  . Physical activity:    Days per week: Not on file    Minutes per session: Not on file  . Stress: Not on file  Relationships  . Social connections:    Talks on phone: Not on file    Gets together: Not on file    Attends  religious service: Not on file    Active member of club or organization: Not on file    Attends meetings of clubs or organizations: Not on file    Relationship status: Not on file  . Intimate partner violence:    Fear of current or ex partner: Not on file    Emotionally abused: Not on file    Physically abused: Not on file    Forced sexual activity: Not on file  Other Topics Concern  . Not on file  Social History Narrative   Patient is married [name]) and lives at home with her husband.   Patient has 2 children by birth and 13 children all together.   Patient has a Oceanographer.   Patient is right-handed.   Patient drinks very little caffeine.          Past Surgical Hx:  Past Surgical History:  Procedure Laterality Date  . ABDOMINAL HYSTERECTOMY     early 67s  . APPENDECTOMY     WITH DEBULKING/BSO  . CHOLECYSTECTOMY     early 63s  . CYSTOSCOPY W/ URETERAL STENT PLACEMENT Left 07/09/2015   DUE TO NEPHROLITHIASIS Procedure: CYSTOSCOPY WITH LEFT RETROGRADE PYELOGRAM/ LEFT URETERAL STENT PLACEMENT;  Surgeon: Ardis Hughs, MD;  Location: [facility];  Service: Urology;  Laterality: Left;  . INCISIONAL HERNIA REPAIR N/A 12/25/2015   WITH MESH Procedure:  INCISIONAL HERNIA REPAIR;  Surgeon: Ralene Ok, MD;  Location: [facility];  Service: General;  Laterality: N/A;  . INSERTION OF MESH N/A 12/25/2015   Procedure: INSERTION OF MESH;  Surgeon: Ralene Ok, MD;  Location: [facility];  Service: General;  Laterality: N/A;  . LAPAROTOMY Bilateral 11/08/2012   Procedure: EXPLORATORY LAPAROTOMY  BILATERAL SALPINGO OOPHORECTOMY TUMOR DEBULKING ;  Surgeon: Imagene Gurney A. Alycia Rossetti, MD;  Location: [facility];  Service: Gynecology;  Laterality: Bilateral;  APPENDECTOMY / OMENTECTOMY  . LAPAROTOMY N/A 12/25/2015   Procedure: EXPLORATORY LAPAROTOMY;  Surgeon: Ralene Ok, MD;  Location: [facility];  Service: General;  Laterality: N/A;  . LYSIS OF ADHESION N/A 12/25/2015   Procedure: LYSIS OF ADHESION;  Surgeon: Ralene Ok, MD;  Location: [facility];  Service: General;  Laterality: N/A;  . TRIGGER FINGER RELEASE      Past Medical Hx:  Past Medical History:  Diagnosis Date  . Burning mouth syndrome   . Chronic fatigue   . Constipation   . Diarrhea    in the past after gallbladder removal  . Fibromyalgia   . GERD (gastroesophageal reflux disease)   . History of kidney stones 07/2015  . Hyperlipidemia   . Hypertension    borderline not on meds   . Insomnia secondary to depression with anxiety   . Lumbar disc disease   . Ovarian cancer ([place]) 2014  . Pelvic mass in female   . Pneumonia    hx of pneumonia as an infant  . PONV (postoperative nausea and vomiting)   . Shortness of breath    with exertion   . Sleep apnea    ;neurologist informed pt she no longer needed her CPAP machine  . Yeast infection     Past Gynecological History:   GYNECOLOGIC HISTORY:  No LMP recorded. Patient has had a hysterectomy.  Family Hx:  Family History  Problem Relation Age of Onset  . Lung cancer Mother 11  . High blood pressure Mother   . High Cholesterol Mother   . Lung cancer Father 66       2 ppd smoker  .  Parkinson's disease Father   . Kidney disease Unknown     Review of Systems:  Review of Systems  Constitutional: Positive for chills, malaise/fatigue and weight loss.  HENT: Positive for sore throat and tinnitus.   Eyes: Negative.   Respiratory: Positive for cough.   Cardiovascular: Negative.   Gastrointestinal: Positive for diarrhea and heartburn.  Genitourinary: Negative.    Musculoskeletal: Positive for back pain and joint pain.  Skin: Positive for itching.  Neurological: Positive for weakness.  Endo/Heme/Allergies: Negative.   Psychiatric/Behavioral: Negative.       Vitals:  Blood pressure 137/88, pulse 84, temperature 98.2 F (36.8 C), temperature source Oral, resp. rate 18, weight 201 lb (91.2 kg), SpO2 99 %.  Physical Exam: ECOG PERFORMANCE STATUS: 0 - Asymptomatic  General :  Well developed, 72 y.o., female in no apparent distress HEENT:  Normocephalic/atraumatic, symmetric, EOMI, eyelids normal Neck:   No visible masses.  Respiratory:  Respirations unlabored, no use of accessory muscles CV:   Deferred Breast:  Deferred Musculoskeletal: Normal muscle strength. Abdomen:  No visible masses or protrusion Extremities:  No visible edema or deformities Skin:   Normal inspection Neuro/Psych:  No focal motor deficit, no abnormal mental status. Normal gait. Normal affect. Alert and oriented to person, place, and time   Pelvic Exam 10/27/17 Genito Urinary: Vulva: Normal external female genitalia.  Bladder/urethra: Urethral meatus normal in size and location. No lesions or   masses, well supported bladder Bimanual exam: Smooth vaginal walls no masses Uterus: Surgically absent   Adnexa: Surgically absent. Rectovaginal:  Good tone, no masses, no cul de sac nodularity, no parametrial involvement or nodularity.   Oncologic Summary: 1. 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 Rising CA 125 January 2019  Question mesenteric nodules and anterior abdominal wall nodule   Assessment/Plan: 1. Eval for recurrent disease o We discussed tissue diagnosis important o Approach could be via laparotomy, however I worry given the small size of the sigmoid mesentary lesions, these may be missed intraoperatively.  o The abdominal wall  lesion, to the right of midline, has been biopsied and negative. o We talked about setting up surgery with a diagnostic laparoscopy  o In the meantime I think repeat CA125 and imaging to help guide the decision to convert to laparotomy. i. So if her CA125 is still increasing and imaging shows increased size of these mesenteric lesions I would be pushed more to convert to laparotomy in the face of a negative diagnostic laparoscopy. ii. However, if the CA125 is not increasing and the imaging shows no worsening of these mesenteric lesions I would be satisfied with observation with a negative diagnostic laparoscopy. iii. The patient and I will discuss this further once we have the results. iv. Part of this is now anxiety-provoking and she may end up favoring laparotomy, despite the risks we have previously discussed, just to get an answer. That would be unfortunate, however, we may be in a situation where those risks are outweighed by her desires to get an answer. 2. Treatment options should recurrence be confirmed o We previously discussed chemo followed by PARP  o If she has carcinomatosis I support upfront chemo given the small size of the lesions and the possibility the tumor may be highly chemo-sensitive. 3. Hernia o We discussed her hernia. She is bothered by this cosmetically, but in no pain and no GI symptoms of concern. o The  hernia is quite high and I do not know how strongly I would push to have that repaired at the time of any procedure we may do. o I offered to send her to her surgeon who has previously repaired a hernia for her. She declined at this time. 4. Genetics o She tells me she was tested for genetic mutation. I would like to see if this was BRCA only and if the testing should be updated. 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 5. Followup o RTC pending above   Isabel Caprice, MD  11/23/2017, 9:53 AM  Cc:  Cari Caraway, MD

## 2017-11-23 ENCOUNTER — Encounter (HOSPITAL_COMMUNITY): Payer: Self-pay

## 2017-11-23 ENCOUNTER — Encounter: Payer: Self-pay | Admitting: Obstetrics

## 2017-11-23 ENCOUNTER — Other Ambulatory Visit: Payer: Self-pay

## 2017-11-23 ENCOUNTER — Telehealth: Payer: Self-pay | Admitting: *Deleted

## 2017-11-23 ENCOUNTER — Encounter (HOSPITAL_COMMUNITY)
Admission: RE | Admit: 2017-11-23 | Discharge: 2017-11-23 | Disposition: A | Discharge status: Home / Self Care | Payer: Medicare HMO | Source: Ambulatory Visit | Attending: Obstetrics | Admitting: Obstetrics

## 2017-11-23 DIAGNOSIS — C5701 Malignant neoplasm of right fallopian tube: Secondary | ICD-10-CM | POA: Diagnosis not present

## 2017-11-23 DIAGNOSIS — R222 Localized swelling, mass and lump, trunk: Secondary | ICD-10-CM | POA: Diagnosis not present

## 2017-11-23 DIAGNOSIS — D1771 Benign lipomatous neoplasm of kidney: Secondary | ICD-10-CM | POA: Diagnosis not present

## 2017-11-23 DIAGNOSIS — K439 Ventral hernia without obstruction or gangrene: Secondary | ICD-10-CM | POA: Diagnosis not present

## 2017-11-23 DIAGNOSIS — I10 Essential (primary) hypertension: Secondary | ICD-10-CM | POA: Diagnosis not present

## 2017-11-23 DIAGNOSIS — K760 Fatty (change of) liver, not elsewhere classified: Secondary | ICD-10-CM | POA: Diagnosis not present

## 2017-11-23 LAB — COMPREHENSIVE METABOLIC PANEL
ALT: 54 U/L (ref 14–54)
AST: 32 U/L (ref 15–41)
Albumin: 4.3 g/dL (ref 3.5–5.0)
Alkaline Phosphatase: 92 U/L (ref 38–126)
Anion gap: 11 (ref 5–15)
BUN: 14 mg/dL (ref 6–20)
CO2: 27 mmol/L (ref 22–32)
Calcium: 9.6 mg/dL (ref 8.9–10.3)
Chloride: 104 mmol/L (ref 101–111)
Creatinine, Ser: 0.67 mg/dL (ref 0.44–1.00)
GFR calc Af Amer: >60 mL/min (ref >60)
GFR calc non Af Amer: >60 mL/min (ref >60)
Glucose, Bld: 98 mg/dL (ref 65–99)
Potassium: 4 mmol/L (ref 3.5–5.1)
Sodium: 142 mmol/L (ref 135–145)
Total Bilirubin: 0.5 mg/dL (ref 0.3–1.2)
Total Protein: 7.4 g/dL (ref 6.5–8.1)

## 2017-11-23 LAB — CBC
HCT: 45.8 % (ref 36.0–46.0)
Hemoglobin: 14.6 g/dL (ref 12.0–15.0)
MCH: 28.5 pg (ref 26.0–34.0)
MCHC: 31.9 g/dL (ref 30.0–36.0)
MCV: 89.3 fL (ref 78.0–100.0)
Platelets: 183 10*3/uL (ref 150–400)
RBC: 5.13 MIL/uL — ABNORMAL HIGH (ref 3.87–5.11)
RDW: 13.2 % (ref 11.5–15.5)
WBC: 6.7 10*3/uL (ref 4.0–10.5)

## 2017-11-23 LAB — URINALYSIS, ROUTINE W REFLEX MICROSCOPIC
Bilirubin Urine: NEGATIVE
Glucose, UA: NEGATIVE mg/dL
Hgb urine dipstick: NEGATIVE
Ketones, ur: NEGATIVE mg/dL
Leukocytes, UA: NEGATIVE
Nitrite: NEGATIVE
Protein, ur: NEGATIVE mg/dL
Specific Gravity, Urine: 1.015 (ref 1.005–1.030)
pH: 6 (ref 5.0–8.0)

## 2017-11-23 LAB — VITAMIN D 25 HYDROXY (VIT D DEFICIENCY, FRACTURES): Vit D, 25-Hydroxy: 37.9 ng/mL (ref 30.0–100.0)

## 2017-11-23 LAB — CA 125: Cancer Antigen (CA) 125: 10.1 U/mL (ref 0.0–38.1)

## 2017-11-23 NOTE — Telephone Encounter (Signed)
Called and spoke with the patient, gave the post op appt

## 2017-11-24 ENCOUNTER — Ambulatory Visit (HOSPITAL_COMMUNITY)
Admission: RE | Admit: 2017-11-24 | Discharge: 2017-11-24 | Disposition: A | Discharge status: Home / Self Care | Payer: Medicare HMO | Source: Ambulatory Visit | Attending: Gynecologic Oncology | Admitting: Gynecologic Oncology

## 2017-11-24 ENCOUNTER — Other Ambulatory Visit: Payer: Self-pay | Admitting: Obstetrics

## 2017-11-24 ENCOUNTER — Telehealth: Payer: Self-pay

## 2017-11-24 ENCOUNTER — Encounter (HOSPITAL_COMMUNITY): Payer: Self-pay

## 2017-11-24 ENCOUNTER — Encounter (HOSPITAL_COMMUNITY): Payer: Self-pay | Admitting: *Deleted

## 2017-11-24 DIAGNOSIS — K760 Fatty (change of) liver, not elsewhere classified: Secondary | ICD-10-CM | POA: Insufficient documentation

## 2017-11-24 DIAGNOSIS — I10 Essential (primary) hypertension: Secondary | ICD-10-CM | POA: Insufficient documentation

## 2017-11-24 DIAGNOSIS — C5701 Malignant neoplasm of right fallopian tube: Secondary | ICD-10-CM

## 2017-11-24 DIAGNOSIS — K439 Ventral hernia without obstruction or gangrene: Secondary | ICD-10-CM | POA: Diagnosis not present

## 2017-11-24 DIAGNOSIS — R1907 Generalized intra-abdominal and pelvic swelling, mass and lump: Secondary | ICD-10-CM

## 2017-11-24 DIAGNOSIS — D1771 Benign lipomatous neoplasm of kidney: Secondary | ICD-10-CM | POA: Insufficient documentation

## 2017-11-24 DIAGNOSIS — R222 Localized swelling, mass and lump, trunk: Secondary | ICD-10-CM | POA: Insufficient documentation

## 2017-11-24 DIAGNOSIS — Z8543 Personal history of malignant neoplasm of ovary: Secondary | ICD-10-CM

## 2017-11-24 MED ORDER — IOHEXOL 300 MG/ML  SOLN
100.0000 mL | Freq: Once | INTRAMUSCULAR | Status: AC | PRN
  Administered 2017-11-24: 100 mL via INTRAVENOUS

## 2017-11-24 NOTE — Telephone Encounter (Signed)
Told Ms Majid that her CT A/P is scheduled for 02-24-18 at [facility] @0930 . Arrive at 0915 to register. P/u contrast and instructions at [facility] radiology by 02-23-18. Lab and flush appointment is scheduled for 02-22-18 at 0945. Appointment with Dr. Gerarda Fraction to discuss scan is on 03-02-18 at 0930. Pt verbalized understanding.

## 2017-11-24 NOTE — Progress Notes (Unsigned)
Discussed normalized and lower CA125 and CT showing nodules smaller. We both agreed on continued close observation . Plan for repeat imaging and CA125 in 3 months

## 2017-11-24 NOTE — Progress Notes (Signed)
11-24-17 Spoke to Dr. Conrad Pearson regarding the final EKG which shows Poor R wave progression. Per Dr. Conrad , Poor R wave progress appears to be based on poor lead placement. Okay to proceed with surgery.

## 2017-11-25 ENCOUNTER — Ambulatory Visit (HOSPITAL_COMMUNITY): Admission: RE | Admit: 2017-11-25 | Payer: Medicare HMO | Source: Ambulatory Visit | Admitting: Obstetrics

## 2017-11-25 ENCOUNTER — Encounter (HOSPITAL_COMMUNITY): Admission: RE | Payer: Self-pay | Source: Ambulatory Visit

## 2017-11-25 LAB — TYPE AND SCREEN
ABO/RH(D): O POS
Antibody Screen: NEGATIVE

## 2017-11-25 SURGERY — LAPAROSCOPY, DIAGNOSTIC
Anesthesia: General

## 2017-12-06 ENCOUNTER — Ambulatory Visit: Payer: Medicare HMO | Admitting: Obstetrics

## 2017-12-15 ENCOUNTER — Inpatient Hospital Stay: Payer: Medicare HMO | Attending: Gynecologic Oncology

## 2017-12-15 DIAGNOSIS — Z9221 Personal history of antineoplastic chemotherapy: Secondary | ICD-10-CM | POA: Diagnosis not present

## 2017-12-15 DIAGNOSIS — Z452 Encounter for adjustment and management of vascular access device: Secondary | ICD-10-CM | POA: Insufficient documentation

## 2017-12-15 DIAGNOSIS — Z95828 Presence of other vascular implants and grafts: Secondary | ICD-10-CM

## 2017-12-15 DIAGNOSIS — Z90722 Acquired absence of ovaries, bilateral: Secondary | ICD-10-CM | POA: Insufficient documentation

## 2017-12-15 DIAGNOSIS — Z90710 Acquired absence of both cervix and uterus: Secondary | ICD-10-CM | POA: Diagnosis not present

## 2017-12-15 DIAGNOSIS — C5701 Malignant neoplasm of right fallopian tube: Secondary | ICD-10-CM | POA: Diagnosis not present

## 2017-12-15 MED ORDER — SODIUM CHLORIDE 0.9 % IJ SOLN
10.0000 mL | INTRAMUSCULAR | Status: DC | PRN
  Administered 2017-12-15: 10 mL via INTRAVENOUS
  Filled 2017-12-15: qty 10

## 2017-12-15 MED ORDER — HEPARIN SOD (PORK) LOCK FLUSH 100 UNIT/ML IV SOLN
500.0000 [IU] | Freq: Once | INTRAVENOUS | Status: AC | PRN
  Administered 2017-12-15: 500 [IU] via INTRAVENOUS
  Filled 2017-12-15: qty 5

## 2017-12-16 DIAGNOSIS — G4733 Obstructive sleep apnea (adult) (pediatric): Secondary | ICD-10-CM | POA: Diagnosis not present

## 2018-01-16 DIAGNOSIS — G4733 Obstructive sleep apnea (adult) (pediatric): Secondary | ICD-10-CM | POA: Diagnosis not present

## 2018-01-20 DIAGNOSIS — G4733 Obstructive sleep apnea (adult) (pediatric): Secondary | ICD-10-CM | POA: Diagnosis not present

## 2018-02-15 DIAGNOSIS — G4733 Obstructive sleep apnea (adult) (pediatric): Secondary | ICD-10-CM | POA: Diagnosis not present

## 2018-02-20 DIAGNOSIS — S70362A Insect bite (nonvenomous), left thigh, initial encounter: Secondary | ICD-10-CM | POA: Diagnosis not present

## 2018-02-22 ENCOUNTER — Ambulatory Visit
Admission: RE | Admit: 2018-02-22 | Discharge: 2018-02-22 | Disposition: A | Discharge status: Home / Self Care | Payer: Medicare HMO | Source: Ambulatory Visit | Attending: Gynecologic Oncology | Admitting: Gynecologic Oncology

## 2018-02-22 ENCOUNTER — Inpatient Hospital Stay: Payer: Medicare HMO

## 2018-02-22 ENCOUNTER — Inpatient Hospital Stay: Payer: Medicare HMO | Attending: Gynecologic Oncology

## 2018-02-22 DIAGNOSIS — Z9221 Personal history of antineoplastic chemotherapy: Secondary | ICD-10-CM | POA: Insufficient documentation

## 2018-02-22 DIAGNOSIS — C5701 Malignant neoplasm of right fallopian tube: Secondary | ICD-10-CM | POA: Insufficient documentation

## 2018-02-22 DIAGNOSIS — N6310 Unspecified lump in the right breast, unspecified quadrant: Secondary | ICD-10-CM | POA: Diagnosis not present

## 2018-02-22 DIAGNOSIS — Z90722 Acquired absence of ovaries, bilateral: Secondary | ICD-10-CM | POA: Diagnosis not present

## 2018-02-22 DIAGNOSIS — R922 Inconclusive mammogram: Secondary | ICD-10-CM | POA: Diagnosis not present

## 2018-02-22 DIAGNOSIS — Z90710 Acquired absence of both cervix and uterus: Secondary | ICD-10-CM | POA: Diagnosis not present

## 2018-02-22 DIAGNOSIS — Z95828 Presence of other vascular implants and grafts: Secondary | ICD-10-CM

## 2018-02-22 DIAGNOSIS — Z8543 Personal history of malignant neoplasm of ovary: Secondary | ICD-10-CM

## 2018-02-22 DIAGNOSIS — R1907 Generalized intra-abdominal and pelvic swelling, mass and lump: Secondary | ICD-10-CM

## 2018-02-22 LAB — BUN & CREATININE (CHCC)
BUN: 11 mg/dL (ref 8–23)
Creatinine: 0.71 mg/dL (ref 0.44–1.00)
GFR, Est AFR Am: >60 mL/min (ref >60)
GFR, Estimated: >60 mL/min (ref >60)

## 2018-02-22 MED ORDER — SODIUM CHLORIDE 0.9% FLUSH
10.0000 mL | INTRAVENOUS | Status: DC | PRN
  Administered 2018-02-22: 10 mL via INTRAVENOUS
  Filled 2018-02-22: qty 10

## 2018-02-22 MED ORDER — HEPARIN SOD (PORK) LOCK FLUSH 100 UNIT/ML IV SOLN
500.0000 [IU] | Freq: Once | INTRAVENOUS | Status: AC
  Administered 2018-02-22: 500 [IU] via INTRAVENOUS
  Filled 2018-02-22: qty 5

## 2018-02-22 NOTE — Patient Instructions (Signed)
Implanted Port Home Guide An implanted port is a type of central line that is placed under the skin. Central lines are used to provide IV access when treatment or nutrition needs to be given through a person's veins. Implanted ports are used for long-term IV access. An implanted port may be placed because:  You need IV medicine that would be irritating to the small veins in your hands or arms.  You need long-term IV medicines, such as antibiotics.  You need IV nutrition for a long period.  You need frequent blood draws for lab tests.  You need dialysis.  Implanted ports are usually placed in the chest area, but they can also be placed in the upper arm, the abdomen, or the leg. An implanted port has two main parts:  Reservoir. The reservoir is round and will appear as a small, raised area under your skin. The reservoir is the part where a needle is inserted to give medicines or draw blood.  Catheter. The catheter is a thin, flexible tube that extends from the reservoir. The catheter is placed into a large vein. Medicine that is inserted into the reservoir goes into the catheter and then into the vein.  How will I care for my incision site? Do not get the incision site wet. Bathe or shower as directed by your health care provider. How is my port accessed? Special steps must be taken to access the port:  Before the port is accessed, a numbing cream can be placed on the skin. This helps numb the skin over the port site.  Your health care provider uses a sterile technique to access the port. ? Your health care provider must put on a mask and sterile gloves. ? The skin over your port is cleaned carefully with an antiseptic and allowed to dry. ? The port is gently pinched between sterile gloves, and a needle is inserted into the port.  Only "non-coring" port needles should be used to access the port. Once the port is accessed, a blood return should be checked. This helps ensure that the port  is in the vein and is not clogged.  If your port needs to remain accessed for a constant infusion, a clear (transparent) bandage will be placed over the needle site. The bandage and needle will need to be changed every week, or as directed by your health care provider.  Keep the bandage covering the needle clean and dry. Do not get it wet. Follow your health care provider's instructions on how to take a shower or bath while the port is accessed.  If your port does not need to stay accessed, no bandage is needed over the port.  What is flushing? Flushing helps keep the port from getting clogged. Follow your health care provider's instructions on how and when to flush the port. Ports are usually flushed with saline solution or a medicine called heparin. The need for flushing will depend on how the port is used.  If the port is used for intermittent medicines or blood draws, the port will need to be flushed: ? After medicines have been given. ? After blood has been drawn. ? As part of routine maintenance.  If a constant infusion is running, the port may not need to be flushed.  How long will my port stay implanted? The port can stay in for as long as your health care provider thinks it is needed. When it is time for the port to come out, surgery will be   done to remove it. The procedure is similar to the one performed when the port was put in. When should I seek immediate medical care? When you have an implanted port, you should seek immediate medical care if:  You notice a bad smell coming from the incision site.  You have swelling, redness, or drainage at the incision site.  You have more swelling or pain at the port site or the surrounding area.  You have a fever that is not controlled with medicine.  This information is not intended to replace advice given to you by your health care provider. Make sure you discuss any questions you have with your health care provider. Document  Released: 07/27/2005 Document Revised: 01/02/2016 Document Reviewed: 04/03/2013 [employer] Interactive Patient Education  2017 [employer]  

## 2018-02-23 LAB — CA 125: Cancer Antigen (CA) 125: 14.1 U/mL (ref 0.0–38.1)

## 2018-02-24 ENCOUNTER — Ambulatory Visit (HOSPITAL_COMMUNITY)
Admission: RE | Admit: 2018-02-24 | Discharge: 2018-02-24 | Disposition: A | Discharge status: Home / Self Care | Payer: Medicare HMO | Source: Ambulatory Visit | Attending: Obstetrics | Admitting: Obstetrics

## 2018-02-24 DIAGNOSIS — R1907 Generalized intra-abdominal and pelvic swelling, mass and lump: Secondary | ICD-10-CM | POA: Diagnosis not present

## 2018-02-24 DIAGNOSIS — K668 Other specified disorders of peritoneum: Secondary | ICD-10-CM | POA: Diagnosis not present

## 2018-02-24 DIAGNOSIS — K439 Ventral hernia without obstruction or gangrene: Secondary | ICD-10-CM | POA: Diagnosis not present

## 2018-02-24 DIAGNOSIS — C569 Malignant neoplasm of unspecified ovary: Secondary | ICD-10-CM | POA: Diagnosis not present

## 2018-02-24 MED ORDER — IOPAMIDOL (ISOVUE-300) INJECTION 61%
INTRAVENOUS | Status: AC
  Filled 2018-02-24: qty 100

## 2018-02-24 MED ORDER — HEPARIN SOD (PORK) LOCK FLUSH 100 UNIT/ML IV SOLN
INTRAVENOUS | Status: AC
  Filled 2018-02-24: qty 5

## 2018-02-24 MED ORDER — HEPARIN SOD (PORK) LOCK FLUSH 100 UNIT/ML IV SOLN
500.0000 [IU] | Freq: Once | INTRAVENOUS | Status: AC
  Administered 2018-02-24: 500 [IU] via INTRAVENOUS

## 2018-02-24 MED ORDER — IOPAMIDOL (ISOVUE-300) INJECTION 61%
100.0000 mL | Freq: Once | INTRAVENOUS | Status: AC | PRN
  Administered 2018-02-24: 100 mL via INTRAVENOUS

## 2018-03-02 ENCOUNTER — Inpatient Hospital Stay: Payer: Medicare HMO | Admitting: Obstetrics

## 2018-03-02 ENCOUNTER — Encounter: Payer: Self-pay | Admitting: Obstetrics

## 2018-03-02 VITALS — BP 152/89 | HR 89 | Temp 97.9°F | Resp 20 | Ht 60.0 in | Wt 210.0 lb

## 2018-03-02 DIAGNOSIS — Z90710 Acquired absence of both cervix and uterus: Secondary | ICD-10-CM

## 2018-03-02 DIAGNOSIS — Z90722 Acquired absence of ovaries, bilateral: Secondary | ICD-10-CM

## 2018-03-02 DIAGNOSIS — C5701 Malignant neoplasm of right fallopian tube: Secondary | ICD-10-CM | POA: Diagnosis not present

## 2018-03-02 DIAGNOSIS — Z9221 Personal history of antineoplastic chemotherapy: Secondary | ICD-10-CM

## 2018-03-02 NOTE — Patient Instructions (Signed)
Plan to have a CT scan of the abdomen and pelvis along with a PET scan on May 27, 2018 then see Dr. Gerarda Fraction in the office on June 01, 2018 or sooner if needed.  You will need to arrive at 8:30am for a 9 am PET scan appointment.  Nothing to eat or drink 6 hours before your PET scan.  Your CT scan will be after the PET scan and they will give you the contrast to drink at that time.   We will also have your come in before your scan appointment to check your kidney function prior to receiving contrast.  Please call for any questions or concerns.

## 2018-03-02 NOTE — Progress Notes (Addendum)
[place] at [facility]    Progress Note: Established Patient Follow-up Visit  CC:  Chief Complaint  Patient presents with  . Carcinoma of right fallopian tube [facility])    HPI: Ms. Kristin Webb  is a very nice 72 y.o.  year old P2   Interval History:   Only new symptom during all of this recent work-up has been some mid back-pain and some mild intermittent heart-burn  Since her last visit we have 2 more CA125 levels lower than the Jan/Feb 2019 (10 in April 2019 and 14 in July 2019)  Also repeat imaging with CT scan. - The impression is somewhat worrisome in its wording but I personally reviewed the images with radiology and explained to the patient overall the imaging was reassuring with the exception of one area    Initial Presentation: She noticed increasing abdominal swelling around the third week of January 2014. Initially, the abdominal swelling was intermittent and would come and go. She related this to initiation of a calcium supplement. The abdominal swelling became more prevalent starting in February with abdominal pain reported also. In addition, she started having increasing constipation and formed stools, which was a change in her routine of 5-6 loose bowel movements a day chronically after her cholecystectomy.  On November 01, 2012, she had a CT scan of the abdomen and pelvis revealing a large midabdominal mass measuring 17 x 22.7 cm x 20 cm.  On November 08, 2012, she underwent an exploratory laparotomy, BSO, appendectomy, infracolic omentectomy, and optimal debulking. Operative findings included: 25 cm right adnexal mass with smooth surface. Surgically absent uterus. Atrophic-appearing left ovary. Normal appearing appendix. Within the omentum there were centimeter nodules scattered throughout the omentum. The remainder of the surfaces were benign. Final pathology revealed:  1. 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 - BENIGN FALLOPIAN TUBE WITH NONSPECIFIC CHRONIC INFLAMMATION. 2. Ovary and fallopian tube, left - BENIGN OVARY; NEGATIVE FOR ATYPIA OR MALIGNANCY. - BENIGN FALLOPIAN TUBE; NEGATIVE FOR ATYPIA OR MALIGNANCY. 3. Omentum, resection for tumor - HIGH GRADE CARCINOMA, SEE COMMENT. 4. Appendix, Other than Incidental - FIBROUS OBLITERATION OF APPENDICEAL TIP. - NEGATIVE FOR MALIGNANCY.   She completed cycle 6 of 6 planned treatments of carboplatin and taxol on 03/28/13.   CA125 was noted in Jan 2019 to go from single digit baseline to the 20's. Repeat in 09/24/2017 confirmed at 27.  Imaging was recommended including a CT and a PET scan.   The CT 09/30/17 revealed : new clustered soft tissue nodularity in the left lower quadrant in the sigmoid mesentery with a dominant 1.0 cm nodule (series 2/image 73).    PET 10/11/17 revealed : Hypermetabolic nodules in the sigmoid mesentery measure up to 1.5 cm (CT image 158) with an SUV max of 9.1. No abnormal hypermetabolism in the liver, adrenal glands, spleen or pancreas. No hypermetabolic lymph nodes.  She denied any symptoms concerning for recurrence.  She did however note some back pain that radiated around to her front; mid back pain. She denies nausea vomiting and changes in bowel movement.  Since my first meeting her (10/27/17), before her trip to [facility] with her grandkids, we talked about options including biopsy of the anterior abdominal wall versus going to surgery . That has since been performed (11/03/17) and negative, despite having an elevated SUV on PET.  There are no corresponding cine clips or images I can review to determine the biopsy location.      --> BIOPSY 11/03/17 FOREIGN BODY GIANT CELL REACTION INVOLVING FIBROADIPOSE TISSUE AND SKELETAL MUSCLE   Measurement of disease:   Recent Labs    08/25/17 1034 09/24/17 0956  11/22/17 1017 02/22/18 0928  CAN125 20.3 27.1 10.1 14.1    Radiology: Ct Abdomen Pelvis W Contrast  Result Date: 02/24/2018 CLINICAL DATA:  Follow-up ovarian carcinoma. Diagnosis 2014 chemotherapy complete. EXAM: CT ABDOMEN AND PELVIS WITH CONTRAST TECHNIQUE: Multidetector CT imaging of the abdomen and pelvis was performed using the standard protocol following bolus administration of intravenous contrast. CONTRAST:  134m ISOVUE-300 IOPAMIDOL (ISOVUE-300) INJECTION 61% COMPARISON:  CT 11/24/2017 FINDINGS: Lower chest: Lung bases are clear. Hepatobiliary: No focal hepatic lesion. Postcholecystectomy. No biliary dilatation. Pancreas: Normal pancreas.  No duct dilatation Spleen: Normal spleen Adrenals/urinary tract: Adrenal glands and kidneys are normal. The ureters and bladder normal. Stomach/Bowel: Stomach, small-bowel and terminal ileum normal. Appendix not identified. Transverse colon enters a ventral abdominal hernia superior to the umbilicus. No obstruction. Descending colon normal. There several diverticula of the sigmoid colon without acute inflammation. Along the mesenteric border of the proximal sigmoid colon there 2 round nodules measuring 15 mm and 10 mm on image 63 and 60 of series 2. These have increased in size from CT of 11/24/2017 which time they measured 5 mm and 8 mm respectively. Additionally there is a nodule along the serosal surface of the proximal sigmoid colon measuring 14 mm (image 67/2 which compares to 7 mm on prior. At least 1 of these nodules was hypermetabolic on PET-CT scan 016/05/9603 Vascular/Lymphatic: Abdominal aorta is normal caliber. No periportal or retroperitoneal adenopathy. No pelvic adenopathy. Small periportal lymph node within normal limits. Reproductive: Post hysterectomy.  No pelvic sidewall nodularity. Other: Midline ventral hernia again noted. Musculoskeletal: No aggressive osseous lesion. IMPRESSION: 1. Continued increase in size small peritoneal nodules along  the mesenteric border of the proximal sigmoid colon as well as along the serosal surface of the proximal sigmoid colon. 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 2. No evidence of distant disease. 3. Stable large ventral hernia. These results will be called to the ordering clinician or representative by the Radiologist Assistant, and communication documented in the PACS or zVision Dashboard. Electronically Signed   By: SSuzy BouchardM.D.   On: 02/24/2018 15:34   [employer] [facility] [employer] [facility]  Result Date: 02/22/2018 CLINICAL DATA:  72year old female presenting for follow-up of a probably benign right breast mass. EXAM: DIGITAL DIAGNOSTIC BILATERAL MAMMOGRAM WITH CAD AND TOMO LEFT BREAST ULTRASOUND COMPARISON:  Previous exam(s). ACR Breast Density Category c: The breast tissue is heterogeneously dense, which may obscure small masses. FINDINGS: No suspicious calcifications, masses or areas of distortion are seen in the bilateral breasts. Mammographic images were processed with CAD. The right breast mass at 9:30, 6 cm from the nipple is stable measuring 1.2 x 0.3 x 0.9 cm, previously measuring 1.2 x 0.4 x 0.9 cm. IMPRESSION: 1. The right breast mass at 9:30 is stable. As this has been unchanged over the course of 2 years, it is benign and no further follow-up is necessary. 2.  No mammographic evidence of malignancy in the bilateral breasts. RECOMMENDATION: Screening mammogram in one year.(Code:SM-B-01Y) I have discussed the findings and recommendations with the patient. Results were also provided in writing at the conclusion of the visit. If applicable, a reminder letter will be sent to  the patient regarding the next appointment. BI-RADS CATEGORY  2: Benign. Electronically Signed   By: Ammie Ferrier M.D.   On: 02/22/2018 09:24   Mm Diag Breast Tomo Bilateral  Result Date: 02/22/2018 CLINICAL DATA:  72 year old female presenting for follow-up of a probably benign  right breast mass. EXAM: DIGITAL DIAGNOSTIC BILATERAL MAMMOGRAM WITH CAD AND TOMO LEFT BREAST ULTRASOUND COMPARISON:  Previous exam(s). ACR Breast Density Category c: The breast tissue is heterogeneously dense, which may obscure small masses. FINDINGS: No suspicious calcifications, masses or areas of distortion are seen in the bilateral breasts. Mammographic images were processed with CAD. The right breast mass at 9:30, 6 cm from the nipple is stable measuring 1.2 x 0.3 x 0.9 cm, previously measuring 1.2 x 0.4 x 0.9 cm. IMPRESSION: 1. The right breast mass at 9:30 is stable. As this has been unchanged over the course of 2 years, it is benign and no further follow-up is necessary. 2.  No mammographic evidence of malignancy in the bilateral breasts. RECOMMENDATION: Screening mammogram in one year.(Code:SM-B-01Y) I have discussed the findings and recommendations with the patient. Results were also provided in writing at the conclusion of the visit. If applicable, a reminder letter will be sent to the patient regarding the next appointment. BI-RADS CATEGORY  2: Benign. Electronically Signed   By: Ammie Ferrier M.D.   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 negatve post treatmen CT 9/14       Fallopian tube carcinoma ([place])   11/01/2012 Imaging    Ct abdomen 1.  Interval development of large mid abdominal mass highly concerning for right ovarian cancer.  There is mild omental nodularity on the left, and peritoneal disease cannot be completely excluded.  There is no ascites or other evidence of metastatic disease. 2.  Mild associated  renal pelvocaliectasis bilaterally without obstruction.  Nonobstructing left renal calculus and a small right renal angiomyolipoma noted incidentally.      11/08/2012 Pathology Results    1. 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 - BENIGN FALLOPIAN TUBE WITH NONSPECIFIC CHRONIC INFLAMMATION. 2. Ovary and fallopian tube, left - BENIGN OVARY; NEGATIVE FOR ATYPIA OR MALIGNANCY. - BENIGN FALLOPIAN TUBE; NEGATIVE FOR ATYPIA OR MALIGNANCY. 3. Omentum, resection for tumor - HIGH GRADE CARCINOMA, SEE COMMENT. 4. Appendix, Other than Incidental - FIBROUS OBLITERATION OF APPENDICEAL TIP. - NEGATIVE FOR MALIGNANCY.      11/08/2012 Surgery    Surgery: Exploratory laparotomy, bilateral salpingo-oophorectomy, appendectomy, infacolic omentectomy, optimal debulking  Surgeons:  Paola A. Alycia Rossetti, MD; Lahoma Crocker, MD   Assistant: Caswell Corwin  Pathology: Bilateral fallopian tubes and ovaries to pathology. Appendix as well as omentum. 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  Operative findings: 25 cm right adnexal mass with smooth surface. Surgically absent uterus. Atrophic-appearing left ovary. Normal appearing appendix. Within the omentum there were  centimeter nodules scattered throughout the omentum. The remainder of the surfaces were benign.      12/08/2012 Procedure    Impression:  Placement of a subcutaneous port device.  The catheter tip is in the lower SVC and ready to be used.       12/13/2012 - 03/28/2013 Chemotherapy    s/p 6 cycles of paclitaxel and carboplatin      12/13/2012 - 03/28/2013 Chemotherapy    The patient had 6 cycles of carboplatin and Taxol      03/24/2013 Imaging    US abdomen      04/24/2013 Imaging    CT abdomen Interval  resection of the large right pelvic and lower abdominal mass lesion with apparent omentectomy.  No evidence for intraperitoneal free fluid on today's study.  No discernible peritoneal lesions.  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      09/07/2013 Genetic Testing    Patient has genetic testing done for BRCA1/2 panel Results revealed patient has no mutation(s):      07/09/2015 Imaging    CT abdomen 1. 12 mm obstructive calculus at the left ureteropelvic junction with moderate proximal hydronephrosis. 2. 2 small supraumbilical ventral hernias, one containing a short segment of the mid transverse colon and the other containing a short segment of the mid small bowel. There is no associated evidence to suggest bowel incarceration or obstruction at this time. 3. Tiny locule of gas non dependently in the lumen of the urinary bladder. This is presumably iatrogenic related to recent catheterization for urinalysis. Alternatively, this could be seen in the setting of urinary tract infection with gas-forming organisms. Clinical correlation for history of recent catheterization is recommended. 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 5. Status post cholecystectomy. 6. Additional incidental findings, as above.       12/11/2016 Imaging    Ct abdomen 1. No evidence of metastatic ovarian cancer. 2. Recurrent subxiphoid ventral abdominal wall hernia containing transverse colon. No evidence of incarceration or obstruction. 3. Stable incidental findings in the liver and kidneys. No recurrent urinary tract calculus. 4. Progressive lower lumbar spondylosis. 5.  Aortic Atherosclerosis (ICD10-I70.0).       08/30/2017 Imaging    MRI thoracic spine 1. At T5-6 there is a small central disc protrusion contacting the ventral thoracic spinal cord. No central canal or foraminal stenosis. 2. At T9-10 there is a small right paracentral disc protrusion. 3.  No acute osseous injury of  the thoracic spine. 4. No aggressive osseous lesion to suggest metastatic disease.      09/24/2017 Tumor Marker    Patient's tumor was tested for the following markers: CA-125 Results of the tumor marker test revealed 21.7      09/30/2017 Imaging    CT abdomen 1. New small clustered soft tissue nodules in the left lower quadrant in the sigmoid mesentery, largest 1.0 cm, which could represent recurrent peritoneal tumor implants. No ascites.  2. Midline high ventral abdominal wall hernia containing a portion of the transverse colon is mildly increased in size, and without bowel complication at this time. 3. Chronic findings include: Aortic Atherosclerosis (ICD10-I70.0). Diffuse hepatic steatosis. 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      10/11/2017 PET scan    1. Nodules in the sigmoid mesentery are hypermetabolic and highly worrisome for metastatic disease. 2. Attic steatosis.       11/03/2017 Procedure    Successful CT-guided rectus abdominal muscle mass  core biopsy.      11/04/2017 Cancer Staging    Staging form: Fallopian Tube, AJCC 7th Edition - Clinical: FIGO Stage IIIC, calculated as Stage III (T3, N0, M0) - Signed by [name], MD on 11/04/2017       Current Meds:  Outpatient Encounter Medications as of 03/02/2018  Medication Sig  . acetaminophen (TYLENOL) 325 MG tablet Take 650 mg by mouth every 6 (six) hours as needed.   . Armodafinil (NUVIGIL) 250 MG tablet Take one tab in the morning. (pt can also take 1/2 tab in the am and 1/2 tab at lunch if that is better) (Patient taking differently: Take 250 mg by mouth daily. )  . Carboxymethylcellulose Sodium (EYE DROPS OP) Apply 2 drops to eye daily as needed (dry eye).  . colestipol (COLESTID) 1 g tablet Take 0.5 g by mouth daily. Pt taking 1/2 to 1 tablet daily  . diphenhydrAMINE (BENADRYL) 25 mg capsule Take 50 mg by mouth at bedtime.  Marland Kitchen EPINEPHrine (EPIPEN 2-PAK) 0.3  mg/0.3 mL DEVI Inject 0.3 mg into the muscle once as needed (For bee stings.). Reported on 01/20/2016  . eszopiclone (LUNESTA) 2 MG TABS tablet Take 1 tablet (2 mg total) at bedtime as needed by mouth for sleep. Take immediately before bedtime (Patient taking differently: Take 2 mg by mouth at bedtime. Take immediately before bedtime)  . FLUoxetine (PROZAC) 10 MG capsule Take 10 mg by mouth daily. Takes with a 41m capsule for her total dose of 351m  . Marland KitchenLUoxetine (PROZAC) 20 MG capsule Take 20 mg by mouth daily. Takes with a 1070mapsule for her total dose of 51m55m. ibMarland Kitchenprofen (ADVIL,MOTRIN) 200 MG tablet Take 400-600 mg by mouth every 6 (six) hours as needed for mild pain or moderate pain.  . Multiple Vitamin (MULTIVITAMIN WITH MINERALS) TABS Take 1 tablet by mouth daily.  . rosuvastatin (CRESTOR) 10 MG tablet Take 10 mg by mouth at bedtime.   . triamterene-hydrochlorothiazide (MAXZIDE-25) 37.5-25 MG tablet Take 1 tablet by mouth daily.   Facility-Administered Encounter Medications as of 03/02/2018  Medication  . sodium chloride flush (NS) 0.9 % injection 10 mL    Allergy:  Allergies  Allergen Reactions  . Ambien [Zolpidem Tartrate] Other (See Comments)    Sleep walking  . Bee Venom Hives    Difficulty breathing, carries an EPI-pen  . Cortisone Nausea And Vomiting, Swelling and Other (See Comments)    INJECTED CORTISONE ONLY, "sick to my stomach, throwing up, hand swelling, and pain."    Social Hx:   Social History   Socioeconomic History  . Marital status: Widowed    Spouse name: BernIlona SorrelNumber of children: 2  . Years of education: Master's  . Highest education level: Not on file  Occupational History  . Not on file  Social Needs  . Financial resource strain: Not on file  . Food insecurity:    Worry: Not on file    Inability: Not on file  . Transportation needs:    Medical: Not on file    Non-medical: Not on file  Tobacco Use  . Smoking status: Never Smoker  .  Smokeless tobacco: Never Used  Substance and Sexual Activity  . Alcohol use: No  . Drug use: No  . Sexual activity: Not on file  Lifestyle  . Physical activity:    Days per week: Not on file    Minutes per session: Not on file  . Stress: Not on file  Relationships  .  Social connections:    Talks on phone: Not on file    Gets together: Not on file    Attends religious service: Not on file    Active member of club or organization: Not on file    Attends meetings of clubs or organizations: Not on file    Relationship status: Not on file  . Intimate partner violence:    Fear of current or ex partner: Not on file    Emotionally abused: Not on file    Physically abused: Not on file    Forced sexual activity: Not on file  Other Topics Concern  . Not on file  Social History Narrative   Patient is married [name]) and lives at home with her husband.   Patient has 2 children by birth and 13 children all together.   Patient has a Oceanographer.   Patient is right-handed.   Patient drinks very little caffeine.          Past Surgical Hx:  Past Surgical History:  Procedure Laterality Date  . ABDOMINAL HYSTERECTOMY     early 33s  . APPENDECTOMY     WITH DEBULKING/BSO  . CHOLECYSTECTOMY     early 41s  . CYSTOSCOPY W/ URETERAL STENT PLACEMENT Left 07/09/2015   DUE TO NEPHROLITHIASIS Procedure: CYSTOSCOPY WITH LEFT RETROGRADE PYELOGRAM/ LEFT URETERAL STENT PLACEMENT;  Surgeon: Ardis Hughs, MD;  Location: [facility];  Service: Urology;  Laterality: Left;  . INCISIONAL HERNIA REPAIR N/A 12/25/2015   WITH MESH Procedure:  INCISIONAL HERNIA REPAIR;  Surgeon: Ralene Ok, MD;  Location: [facility];  Service: General;  Laterality: N/A;  . INSERTION OF MESH N/A 12/25/2015   Procedure: INSERTION OF MESH;  Surgeon: Ralene Ok, MD;  Location: [facility];  Service: General;  Laterality: N/A;  . LAPAROTOMY Bilateral 11/08/2012   Procedure: EXPLORATORY LAPAROTOMY BILATERAL SALPINGO OOPHORECTOMY  TUMOR DEBULKING ;  Surgeon: Imagene Gurney A. Alycia Rossetti, MD;  Location: [facility];  Service: Gynecology;  Laterality: Bilateral;  APPENDECTOMY / OMENTECTOMY  . LAPAROTOMY N/A 12/25/2015   Procedure: EXPLORATORY LAPAROTOMY;  Surgeon: Ralene Ok, MD;  Location: [facility];  Service: General;  Laterality: N/A;  . LYSIS OF ADHESION N/A 12/25/2015   Procedure: LYSIS OF ADHESION;  Surgeon: Ralene Ok, MD;  Location: [facility];  Service: General;  Laterality: N/A;  . PARTIAL HYSTERECTOMY  2006  . TRIGGER FINGER RELEASE      Past Medical Hx:  Past Medical History:  Diagnosis Date  . Burning mouth syndrome   . Chronic fatigue   . Constipation   . Diarrhea    in the past after gallbladder removal  . Fibromyalgia   . GERD (gastroesophageal reflux disease)   . History of kidney stones 07/2015  . Hyperlipidemia   . Hypertension    borderline not on meds   . Insomnia secondary to depression with anxiety   . Lumbar disc disease   . Ovarian cancer ([place]) 2014  . Pelvic mass in female   . Pneumonia    hx of pneumonia as an infant  . PONV (postoperative nausea and vomiting)   . Shortness of breath    with exertion   . Sleep apnea    ;neurologist informed pt she no longer needed her CPAP machine  . Yeast infection     Past Gynecological History:   GYNECOLOGIC HISTORY:  No LMP recorded. Patient has had a hysterectomy.  Family Hx:  Family History  Problem Relation Age of Onset  . Lung cancer Mother 52  .  High blood pressure Mother   . High Cholesterol Mother   . Lung cancer Father 35       2 ppd smoker  . Parkinson's disease Father   . Kidney disease Unknown     Review of Systems:  Review of Systems  Constitutional: Positive for fatigue and unexpected weight change.  HENT:   Positive for mouth sores and tinnitus.   Respiratory: Positive for cough.   Gastrointestinal: Positive for diarrhea.  Musculoskeletal: Positive for back pain and myalgias.  Neurological: Positive for numbness.   Psychiatric/Behavioral: The patient is nervous/anxious.   All other systems reviewed and are negative. Heartburn Urinary urgency   Vitals:  Blood pressure (!) 152/89, pulse 89, temperature 97.9 F (36.6 C), temperature source Oral, resp. rate 20, height 5' (1.524 m), weight 210 lb (95.3 kg), SpO2 100 %.  Physical Exam: ECOG PERFORMANCE STATUS: 0 - Asymptomatic  +hernia upper abdomen  Pelvic Exam 03/02/2018 Genito Urinary: Vulva: Normal external female genitalia.  Bladder/urethra: Urethral meatus normal in size and location. No lesions or   masses, well supported bladder Bimanual exam: Smooth vaginal walls no masses Uterus: Surgically absent   Adnexa: Surgically absent. Rectovaginal:  Good tone, no masses, no cul de sac nodularity, no parametrial involvement or nodularity.   Oncologic Summary: 1. 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 Rising CA 125 January 2019  Question mesenteric nodules and anterior abdominal wall nodule   Assessment/Plan: 1. Data reviewed o After personally reviewing the images with radiology and then with the patient in the office today we all agree on continued close followup. i. The new lesion seen is a 14.28m nodule in the mesentary that may have been absent, smaller, or obscured in the prior imaging ii. The other lesions in full review of all imaging since early 2019 appear overall stable to smaller (again on verbal review with radiology) o Her tumor markers are reviewed today. Those seem to be reassuring 2. Eval for recurrent disease o We discussed tissue diagnosis important i. Approach could be via laparotomy, however I worry given the small size of the sigmoid mesentary lesions, these may be missed intraoperatively.  ii. We have in visits past talked about setting up surgery with a diagnostic laparoscopy  o The  abdominal wall lesion, to the right of midline, has been biopsied and negative. o I recommended continued close followup with repeat imaging in 3 months  i. CT scan of the abdomen and pelvis along with a PET scan on May 27, 2018  3. Treatment options should recurrence be confirmed o We previously discussed chemo followed by PARP  o If she has carcinomatosis I support upfront chemo given the small size of the lesions and the possibility the tumor may be highly chemo-sensitive. o Surgical debulking is an option too given her platinum sensitivity 4. Hernia o We discussed her hernia. She is bothered by this cosmetically, but in no pain and no GI symptoms of concern. o The hernia is quite high and I do not know how strongly I would push to have that repaired at the time of any procedure we may do. o I offered to send her to her surgeon who has previously repaired a hernia for her. She declined at this time. 5. Genetics o She tells me she was tested for genetic mutation. I would like to see if this was BRCA only and if the testing  should be updated. 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 6. Followup o RTC pending above  Face to face time with patient was 15 minutes. Over 50% of this time was spent on counseling and coordination of care.   Isabel Caprice, MD  03/02/2018, 10:28 AM  Cc:  Cari Caraway, MD (PCP)

## 2018-03-20 ENCOUNTER — Encounter: Payer: Self-pay | Admitting: Obstetrics

## 2018-03-24 ENCOUNTER — Encounter: Payer: Self-pay | Admitting: Obstetrics

## 2018-03-24 DIAGNOSIS — M25512 Pain in left shoulder: Secondary | ICD-10-CM | POA: Diagnosis not present

## 2018-03-28 DIAGNOSIS — C569 Malignant neoplasm of unspecified ovary: Secondary | ICD-10-CM | POA: Diagnosis not present

## 2018-04-05 ENCOUNTER — Encounter: Payer: Self-pay | Admitting: Family Medicine

## 2018-04-05 DIAGNOSIS — Z0000 Encounter for general adult medical examination without abnormal findings: Secondary | ICD-10-CM | POA: Diagnosis not present

## 2018-04-05 DIAGNOSIS — G4733 Obstructive sleep apnea (adult) (pediatric): Secondary | ICD-10-CM | POA: Diagnosis not present

## 2018-04-05 DIAGNOSIS — Z6841 Body Mass Index (BMI) 40.0 and over, adult: Secondary | ICD-10-CM | POA: Diagnosis not present

## 2018-04-05 DIAGNOSIS — E782 Mixed hyperlipidemia: Secondary | ICD-10-CM | POA: Diagnosis not present

## 2018-04-05 DIAGNOSIS — G47419 Narcolepsy without cataplexy: Secondary | ICD-10-CM | POA: Diagnosis not present

## 2018-04-05 DIAGNOSIS — R739 Hyperglycemia, unspecified: Secondary | ICD-10-CM | POA: Diagnosis not present

## 2018-04-05 DIAGNOSIS — K439 Ventral hernia without obstruction or gangrene: Secondary | ICD-10-CM | POA: Diagnosis not present

## 2018-04-05 DIAGNOSIS — F419 Anxiety disorder, unspecified: Secondary | ICD-10-CM | POA: Diagnosis not present

## 2018-04-05 DIAGNOSIS — I10 Essential (primary) hypertension: Secondary | ICD-10-CM | POA: Diagnosis not present

## 2018-04-05 DIAGNOSIS — E559 Vitamin D deficiency, unspecified: Secondary | ICD-10-CM | POA: Diagnosis not present

## 2018-04-19 ENCOUNTER — Other Ambulatory Visit: Payer: Self-pay | Admitting: Neurology

## 2018-04-19 DIAGNOSIS — Z9989 Dependence on other enabling machines and devices: Secondary | ICD-10-CM

## 2018-04-19 DIAGNOSIS — F5102 Adjustment insomnia: Secondary | ICD-10-CM

## 2018-04-19 DIAGNOSIS — G4733 Obstructive sleep apnea (adult) (pediatric): Secondary | ICD-10-CM

## 2018-04-19 DIAGNOSIS — G4719 Other hypersomnia: Secondary | ICD-10-CM

## 2018-04-22 ENCOUNTER — Encounter (HOSPITAL_COMMUNITY): Payer: Self-pay | Admitting: Obstetrics

## 2018-05-08 ENCOUNTER — Encounter: Payer: Self-pay | Admitting: Neurology

## 2018-05-09 ENCOUNTER — Encounter: Payer: Medicare HMO | Attending: Family Medicine | Admitting: Registered"

## 2018-05-09 ENCOUNTER — Ambulatory Visit: Payer: Medicare HMO | Admitting: Neurology

## 2018-05-09 ENCOUNTER — Encounter: Payer: Self-pay | Admitting: Neurology

## 2018-05-09 ENCOUNTER — Encounter: Payer: Self-pay | Admitting: Registered"

## 2018-05-09 VITALS — BP 169/99 | HR 90 | Ht 60.0 in | Wt 209.5 lb

## 2018-05-09 DIAGNOSIS — E119 Type 2 diabetes mellitus without complications: Secondary | ICD-10-CM

## 2018-05-09 DIAGNOSIS — R530 Neoplastic (malignant) related fatigue: Secondary | ICD-10-CM

## 2018-05-09 DIAGNOSIS — Z9989 Dependence on other enabling machines and devices: Secondary | ICD-10-CM | POA: Diagnosis not present

## 2018-05-09 DIAGNOSIS — E1159 Type 2 diabetes mellitus with other circulatory complications: Secondary | ICD-10-CM | POA: Insufficient documentation

## 2018-05-09 DIAGNOSIS — F5102 Adjustment insomnia: Secondary | ICD-10-CM | POA: Diagnosis not present

## 2018-05-09 DIAGNOSIS — G4719 Other hypersomnia: Secondary | ICD-10-CM

## 2018-05-09 DIAGNOSIS — R635 Abnormal weight gain: Secondary | ICD-10-CM

## 2018-05-09 DIAGNOSIS — G4733 Obstructive sleep apnea (adult) (pediatric): Secondary | ICD-10-CM

## 2018-05-09 DIAGNOSIS — Z713 Dietary counseling and surveillance: Secondary | ICD-10-CM | POA: Insufficient documentation

## 2018-05-09 HISTORY — DX: Type 2 diabetes mellitus without complications: E11.9

## 2018-05-09 MED ORDER — ARMODAFINIL 250 MG PO TABS: 250.0000 mg | ORAL_TABLET | Freq: Every day | ORAL | 2 refills | Status: DC

## 2018-05-09 MED ORDER — ESZOPICLONE 2 MG PO TABS: ORAL_TABLET | ORAL | 1 refills | Status: DC

## 2018-05-09 NOTE — Progress Notes (Signed)
Diabetes Self-Management Education  Visit Type: First/Initial  Appt. Start Time: 1050 Appt. End Time: 2952  05/09/2018  Kristin Webb, identified by name and date of birth, is a 72 y.o. female with a diagnosis of Diabetes: Type 2.   ASSESSMENT Pt provided a list of her eating goals, including foods she tries to include daily and those she avoids (white foods), her temptations, and exercise. Pt states she has been an emotional eater and has been trying to address this since she was 72 yrs old. Pt reports she does not have fullness sensation to tell her when to stop eating, states her head knows but body doesn't. Pt states she uses a smaller plate, smaller fork - eats less, but enjoys food and feels she over eats. Pt states she is not tempted by sweet foods, but enjoys bread, pasta and has difficulty at pot luck events. Pt states she attends a potluck every month and has she is dishing up her plate she tells herself she doesn't need 2 servings of all her favorite foods, but puts in on her plate anyway. Pt states she doesn't beat herself up over it, but would like to figure out how to change this behavior. RD suggested she contact Suzette Battiest, RD at [facility].  Pt states she is not interested in eating in the morning, but feels she should eat breakfast so will usually pick something easy such as cereal. Pt had remember advice to make dinner her largest meal, but states lunch would be easier for her to have be her largest meal.  Pt states she has had stress in the last few years due to cancer, and recent family deaths. Pt states she was getting ready to have chemo and her pre-labs revealed it did not look like she needed to do chemo, but they will keep an eye on it.   Pt states she gets 8-9 hr sleep using CPAP, melatonin and Lunesta.  GI: Pt states since cholecystomy she has had issues, reports 6-8 bowel movements per day which she considered relatively controlled taking an older  medication, Colestid. Pt states caffeine causes food to go through her. Pt states she experiences BM urgency within 15-60 minutes of eating any food, sometimes even water.  Pt states she has a social support system, enjoys her dog, tai chi and aquatic exercise.  Diabetes Self-Management Education - 05/09/18 1123      Visit Information   Visit Type  First/Initial      Initial Visit   Diabetes Type  Type 2    Are you currently following a meal plan?  Yes    What type of meal plan do you follow?  nothing white    Are you taking your medications as prescribed?  Yes    Date Diagnosed  2019      Health Coping   How would you rate your overall health?  Good      Psychosocial Assessment   Patient Belief/Attitude about Diabetes  Motivated to manage diabetes    How often do you need to have someone help you when you read instructions, pamphlets, or other written materials from your doctor or pharmacy?  1 - Never    What is the last grade level you completed in school?  Graduate      Complications   Last HgB A1C per patient/outside source  6.9 %    How often do you check your blood sugar?  0 times/day (not testing)    Have  you had a dilated eye exam in the past 12 months?  Yes    Have you had a dental exam in the past 12 months?  Yes    Are you checking your feet?  Yes    How many days per week are you checking your feet?  6      Dietary Intake   Breakfast  tuna fish sand OR omlette w fresh veg whole grain bread OR fruit, cheese OR cereal with Blueberries, almond milk    Snack (morning)  cheese, fruit, kim chee, pickles, nuts    Lunch  soup, brown rice OR cous cous, vegetables    Snack (afternoon)  same as above    Holiday representative  (biggest meal)    Snack (evening)  plain yogurt with fresh fruit OR chocolate cov banana    Beverage(s)  water, de-caf unsweet tea, diet snapple 1/2 diluted, 'ice' flavored water      Exercise   Exercise Type  Light (walking / raking leaves)    How many days per  week to you exercise?  4    How many minutes per day do you exercise?  30    Total minutes per week of exercise  120      Patient Education   Previous Diabetes Education  No   self-directed internet research   Nutrition management   Role of diet in the treatment of diabetes and the relationship between the three main macronutrients and blood glucose level    Physical activity and exercise   Role of exercise on diabetes management, blood pressure control and cardiac health.    Monitoring  Taught/evaluated SMBG meter.    Acute complications  Taught treatment of hypoglycemia - the 15 rule.    Psychosocial adjustment  Role of stress on diabetes      Individualized Goals (developed by patient)   Nutrition  General guidelines for healthy choices and portions discussed    Monitoring   test my blood glucose as discussed      Outcomes   Expected Outcomes  Demonstrated interest in learning. Expect positive outcomes    Future DMSE  PRN    Program Status  Completed      Individualized Plan for Diabetes Self-Management Training:   Learning Objective:  Patient will have a greater understanding of diabetes self-management. Patient education plan is to attend individual and/or group sessions per assessed needs and concerns.  Patient Instructions  Eat What You Love, Love what you Eat; [place] or Lyft are some transportation options that may work for you. Ask a friend if they can help you get it set up on your phone. Consider eating balanced meals and snacks and having your lunch be your largest meal. You can test your blood sugar fasting and 2 hrs after eating to help you see how your blood sugar control is doing.  Expected Outcomes:  Demonstrated interest in learning. Expect positive outcomes  Education material provided: A1C conversion sheet and My Plate  If problems or questions, patient to contact team via:  Phone  Future DSME appointment: PRN

## 2018-05-09 NOTE — Progress Notes (Signed)
[facility]  Provider:  Asencion Partridge  Eldridge Marcott,M.D.   Referring Provider: Cari Caraway, MD at [facility].   Primary Care Physician: Nancy Marus, MD [facility]. Standley Dakins, MD at [facility].  -  Review sleepiness, CPAP compliance.    HPI:  Kristin Webb is a 72 y.o. female patient with a history of OSA on CPAP -and persistent fatigue, is seen here in a revisit for CPAP follow up/ compliance visit.  Kristin Webb is seen here on 09 May 2018, she has been a highly compliant CPAP user, her downloaded data however have to be combined from 2 different machines.  For general use at home she has an air sense 10 AutoSet machine and then she has a travel CPAP as well.  The average user time on days used is 8 hours and 59 minutes, she has used CPAP in one form or another for 97% of the recorded time, she is using AutoSet 5-15 cmH2O was 1 cm EPR level the 95th percentile pressure required is 14.5 which straddles the upper limit.  Residual AHI is 0.8 which is excellent and there are no major air leaks.  She has gained weight since I saw her last and her BMI rose by over 4 points this may explain why she needs a higher setting at this time.  Her Epworth Sleepiness Scale was endorsed at 13 out of 24 possible points and her fatigue severity at 44 out of 63 possible points, the geriatric depression scale was endorsed at 1 out of 15 points.    Interval history from 05/05/2017, Kristin Webb is doing well, she is living by herself with her dog, recently had some home restoration to do. This added a little bit of hectic to her life, however she is doing well and her counselor has not progressed. She is followed by a new oncologist, [name]. She underwent another HST following significant weight gain, AHI was 9.1 /hr. She has many desaturations and was placed on auto CPAP in response to high EDS with mild apnea.  Her compliance to CPAP with excellent at 90% with an average use of 8  hours and 32 minutes, she is using an AutoSet between 5 and 15 cm water was 1 cm EPR and her residual AHI is 1.9. This is excellent resolution of her apnea. The 95th percentile pressure is 14.8, there were no central apneas emerging, there is no need to adjust the machine in any way. She endorsed the Epworth Sleepiness Scale at 9 points, fatigue severity at 44 points and the geriatric depression score is 1 out of 15 points. She continues to participate in a grief group after her husband's death. She started Yoga, Zen knitting and fosters dogs.    12-12-13,Since I have seen her last this patient was diagnosed with ovarian cancer, underwent surgery and chemotherapy. She currently struggles with a weak  abdominal and core musculatur  and it feels to her she had an abdominal hernia. She has tried a corsett , but this has aggravated her bulging discs in her spine.  The patient was originally referred by Dr. Sheryn Bison at the time with fatigue , pain, excessive sleepiness and burning mouth syndrome. In 2010 she developed transiently left mouth droop, and a TIA/ CVA work up was negative. EMG and nerve conduction studies were unrevealing , a rheumatology consult,  pain specialist , neurosurgical evaluation were unrevealing .  Labs revealed a vitamin D deficiency,  normal TSH and CMET, CBC.  Her neurosurgery consult  for back pain was without results.  She was referred to me for a sleep consultation in 2009. The patient was diagnosed with sleep apnea on 03-07-08 with an AHI of 24.3 and in REM AHI of 82. Her BMI at the time was 37.9 , she was titrated to 10 cm water pressure on CPAP  which relieved her AHI but she still snored.  Kristin Webb's newest  sleep study dated 11-26-13 whowed  residual apnea. The study was therefore a normal polysomnography and not split into a titration parts. The AHI was 0.9, the RDI was 2.4 and the oxygen nadir was 87% time and his saturation was 33.6 minutes at or below 90% saturation of  oxygen. Heart which was regular, normal sinus rhythm prevailed. She still sleeps better with Lunesta 2 mg could not get a good result from 1 mg pills. Fragmentation of sleep, alpha intrusion can be seen in chronic pain and fatigue.She is not a coffee drinker, there is no caffeine to be cut out. There is no pain in sleep. Sleep psychology referral is problematic with [facility]. Sleep hygiene was discussed.   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 She has compliantly  used a  Original machine,  New the CPAP  machine seems to have failed. I am unable to obtain any data. In addition the patient had remained excessively fatigued and daytime sleepy. Possibly , this could have been a paraneoplastic manifestation of ovarian malignancy. She has been using Nuvigil as tolerated which has given her improvement in life quality and energy. The last CPAP  download was on 05-06-12 with the 95% percentile pressure of 12 cm water, the residual AHI of 0.5 and an average of 5.9 hours of night-user time for CPAP.   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 . Kristin Webb since being diagnosed with ovarian cancer has underwent a lot of emotional changes as well. Her oncologist, Dr. Marko Plume, feels that some of the nausea is GERD-   So far since last week has not been a palpable results. The patient has also tried to wean off her sleep aids but became panicked and insomniac again.Mr. Fleig had been admitted to the hospital after a spot on his lung was incidentally found , while being evaluated for kidney stones. He went into acute respiratory distress was placed on oxygen oxygen and readmitted to the hospital but never recovered. And during that time she needed to return to her previous insomnia regimen. She is under financial distress, had a lot of difficulties  since her husbands death.  CD Interval history from 05/18/2016, Kristin Webb has been able to cut down on her Lunesta dose from 2 mg to 1 mg and uses if necessary Nuvigil to drive. She has slept usually uninterrupted as of last summer also thinks have changed again in 2017 now she begun in August to wake up 3-4 times a week in AM with a headache, very unusual for her. Nausea is associated with these headaches, no photophobia or phonophobia is reported. And as of August she has needed and used Nuvigil 125 g daily again. Not waking up in the night, told that she snores by a roommate during a church trip. Her fatigue is reminiscent of the times when she had suffered from sleep apnea.   CD Interval history from 07/15/2016, Kristin Webb is here to see the results of her recent home  sleep test, performed on 06/10/2016. Her AHI was 2.2 her RDI was 5 per hour there was no significant sleep apnea noted no oxygen desaturation was found, and she did not have tachycardia or bradycardia arrhythmia. Her fatigue has remained her Epworth sleepiness score has actually increased to 12 point from 10 during our last visit. She states that she often only gets up in the morning before because she has to let the dog out but then feels that she needs another hour of sleep afterwards. She also suffers from dry mouth and dry eyes. There is a possible autoimmune component, and she does suffer from fibromyalgia.  She does not endorse a significant depression score since she lost her husband she has been grieving but she has not "fallen into a deep hole". She sleeps with the help of Lunesta 1 mg and Benadryl, she has failed Ambien (which caused her to sleep walk). She was also significantly more drowsy in daytime. She could neither tolerate Ambien 10 not 5 mg.    Review of Systems: Out of a complete 14 system review, the patient complains of only the following symptoms, and all other reviewed systems are negative. Fatigue , EDS,  abdominal distention, dry mouth. Dry eyes, chemotherapy related ?Marland Kitchen  She still uses Lunesta 1 mg prn  for insomnia, no evidence of edema, or rash. Dry mouth, dry eyes, leg cramping.  BMI further elevated from 35 up to 41 with signifcant abdominal obesity.   Compliant user of CPAP - she has 2 machines, one for travels. She has URIs and she may benefit from a cleaning machine .  1 mg of Lunesta and 25 mg of Benadryl at night.  She eats earlier, takes snakes which helps her nausea- fatigue and sleepiness.     Social History   Socioeconomic History  . Marital status: Widowed    Spouse name: [name]  . Number of children: 2  . Years of education: Master's  . Highest education level: Not on file  Occupational History  . Not on file  Social Needs  . Financial resource strain: Not on file  . Food insecurity:    Worry: Not on file    Inability: Not on file  . Transportation needs:    Medical: Not on file    Non-medical: Not on file  Tobacco Use  . Smoking status: Never Smoker  . Smokeless tobacco: Never Used  Substance and Sexual Activity  . Alcohol use: No  . Drug use: No  . Sexual activity: Not on file  Lifestyle  . Physical activity:    Days per week: Not on file    Minutes per session: Not on file  . Stress: Not on file  Relationships  . Social connections:    Talks on phone: Not on file    Gets together: Not on file    Attends religious service: Not on file    Active member of club or organization: Not on file    Attends meetings of clubs or organizations: Not on file    Relationship status: Not on file  . Intimate partner violence:    Fear of current or ex partner: Not on file    Emotionally abused: Not on file    Physically abused: Not on file    Forced sexual activity: Not on file  Other Topics Concern  . Not on file  Social History Narrative   Patient is married [name]) and lives at home with her husband.   Patient has  2 children by birth and 13 children all  together.   Patient has a Oceanographer.   Patient is right-handed.   Patient drinks very little caffeine.          Family History  Problem Relation Age of Onset  . Lung cancer Mother 61  . High blood pressure Mother   . High Cholesterol Mother   . Lung cancer Father 105       2 ppd smoker  . Parkinson's disease Father   . Kidney disease Unknown     Past Medical History:  Diagnosis Date  . Burning mouth syndrome   . Chronic fatigue   . Constipation   . Diarrhea    in the past after gallbladder removal  . Fibromyalgia   . GERD (gastroesophageal reflux disease)   . History of kidney stones 07/2015  . Hyperlipidemia   . Hypertension    borderline not on meds   . Insomnia secondary to depression with anxiety   . Lumbar disc disease   . Ovarian cancer ([place]) 2014  . Pelvic mass in female   . Pneumonia    hx of pneumonia as an infant  . PONV (postoperative nausea and vomiting)   . Shortness of breath    with exertion   . Sleep apnea    ;neurologist informed pt she no longer needed her CPAP machine  . Yeast infection     Past Surgical History:  Procedure Laterality Date  . ABDOMINAL HYSTERECTOMY     early 76s  . APPENDECTOMY     WITH DEBULKING/BSO  . CHOLECYSTECTOMY     early 75s  . CYSTOSCOPY W/ URETERAL STENT PLACEMENT Left 07/09/2015   DUE TO NEPHROLITHIASIS Procedure: CYSTOSCOPY WITH LEFT RETROGRADE PYELOGRAM/ LEFT URETERAL STENT PLACEMENT;  Surgeon: Ardis Hughs, MD;  Location: [facility];  Service: Urology;  Laterality: Left;  . INCISIONAL HERNIA REPAIR N/A 12/25/2015   WITH MESH Procedure:  INCISIONAL HERNIA REPAIR;  Surgeon: Ralene Ok, MD;  Location: [facility];  Service: General;  Laterality: N/A;  . INSERTION OF MESH N/A 12/25/2015   Procedure: INSERTION OF MESH;  Surgeon: Ralene Ok, MD;  Location: [facility];  Service: General;  Laterality: N/A;  . LAPAROTOMY Bilateral 11/08/2012   Procedure: EXPLORATORY LAPAROTOMY BILATERAL SALPINGO OOPHORECTOMY TUMOR  DEBULKING ;  Surgeon: Imagene Gurney A. Alycia Rossetti, MD;  Location: [facility];  Service: Gynecology;  Laterality: Bilateral;  APPENDECTOMY / OMENTECTOMY  . LAPAROTOMY N/A 12/25/2015   Procedure: EXPLORATORY LAPAROTOMY;  Surgeon: Ralene Ok, MD;  Location: [facility];  Service: General;  Laterality: N/A;  . LYSIS OF ADHESION N/A 12/25/2015   Procedure: LYSIS OF ADHESION;  Surgeon: Ralene Ok, MD;  Location: [facility];  Service: General;  Laterality: N/A;  . PARTIAL HYSTERECTOMY  2006  . TRIGGER FINGER RELEASE      Current Outpatient Medications  Medication Sig Dispense Refill  . Armodafinil (NUVIGIL) 250 MG tablet Take one tab in the morning. (pt can also take 1/2 tab in the am and 1/2 tab at lunch if that is better) (Patient taking differently: Take 250 mg by mouth daily. ) 90 tablet 1  . Carboxymethylcellulose Sodium (EYE DROPS OP) Apply 2 drops to eye daily as needed (dry eye).    . colestipol (COLESTID) 1 g tablet Take 0.5 g by mouth daily. Pt taking 1/2 to 1 tablet daily    . EPINEPHrine (EPIPEN 2-PAK) 0.3 mg/0.3 mL DEVI Inject 0.3 mg into the muscle once as needed (For bee stings.). Reported on  01/20/2016    . eszopiclone (LUNESTA) 2 MG TABS tablet TAKE ONE TABLET BY MOUTH EVERY NIGHT AT BEDTIME AS NEEDED FOR SLEEP (TAKE IMMEDIATELY BEFORE BEDTIME) 90 tablet 0  . FLUoxetine (PROZAC) 10 MG capsule Take 10 mg by mouth daily. Takes with a 20mg  capsule for her total dose of 30mg .    . FLUoxetine (PROZAC) 20 MG capsule Take 20 mg by mouth daily. Takes with a 10mg  capsule for her total dose of 30mg .    . ibuprofen (ADVIL,MOTRIN) 200 MG tablet Take 400-600 mg by mouth every 6 (six) hours as needed for mild pain or moderate pain.    Marland Kitchen MELATONIN PO Take by mouth.    . metFORMIN (GLUCOPHAGE-XR) 500 MG 24 hr tablet Take 1 tablet by mouth daily.  3  . Multiple Vitamin (MULTIVITAMIN WITH MINERALS) TABS Take 1 tablet by mouth daily.    . naproxen sodium (ALEVE) 220 MG tablet Take 220 mg by mouth.    . rosuvastatin  (CRESTOR) 10 MG tablet Take 10 mg by mouth at bedtime.     . triamterene-hydrochlorothiazide (MAXZIDE-25) 37.5-25 MG tablet Take 1 tablet by mouth daily. 30 tablet 1   No current facility-administered medications for this visit.    Facility-Administered Medications Ordered in Other Visits  Medication Dose Route Frequency Provider Last Rate Last Dose  . sodium chloride flush (NS) 0.9 % injection 10 mL  10 mL Intravenous PRN Nancy Marus, MD   10 mL at 05/12/17 0823    Allergies as of 05/09/2018 - Review Complete 05/09/2018  Allergen Reaction Noted  . Ambien [zolpidem tartrate] Other (See Comments) 12/10/2015  . Bee venom Hives 11/02/2012  . Cortisone Nausea And Vomiting, Swelling, and Other (See Comments) 11/02/2012    Vitals: BP (!) 169/99   Pulse 90   Ht 5' (1.524 m)   Wt 209 lb 8 oz (95 kg)   BMI 40.92 kg/m  Last Weight:  Wt Readings from Last 1 Encounters:  05/09/18 209 lb 8 oz (95 kg)   Last Height:   Ht Readings from Last 1 Encounters:  05/09/18 5' (1.524 m)    Physical exam: Regained weight again. Fatigued again.  General: The patient is awake, alert and appears not in acute distress.  She reports fatigue, is well groomed. She has a new abdominal hernia.  Head: Normocephalic,  Neck is supple. Mallampati 3 , neck circumference: 15. Retrognathia. Dry mouth .  Dental  Decay  Cardiovascular: Regular rate and rhythm without  murmurs or carotid bruit, and without distended neck veins. Respiratory: Lungs are clear to auscultation.Skin:  Without evidence of edema, or rash,. Dry mouth, dry eyes, leg cramping. Trunk: BMI further elevated from 35 up to 41 with signifcant abdominal obesity.  Neurologic exam : The patient is awake and alert, oriented to place and time.  Memory subjective  described as intact. There is a normal attention span & concentration ability.  Speech is fluent without  dysarthria, dysphonia or aphasia. Mood and affect are appropriate.  Cranial  nerves: Pupils are equal and briskly reactive to light.Visual fields by finger perimetry are intact. Hearing to finger rub intact.  Facial sensation intact to fine touch. Facial motor strength is symmetric, her tongue and uvula move in  midline. Motor exam:  Normal tone ,muscle bulk and symmetric strength in all extremities. Grip strength is improved since 2016.  Gait and station: Patient walks without assistive device , Strength within normal limits.  Steps are unfragmented.  Deep tendon reflexes: in the  upper and lower extremities are symmetric and intact.    Assessment:  After physical and neurologic examination, review of laboratory studies, imaging, neurophysiology testing and pre-existing records, assessment is   1) OSA : was considered resolved as of 2016 ( AHI was 0.9).   After HST  2017 was again mild OSA positive  AHI was 2.2 on 4% scoring, but would be 5.6 on 3% scoring,   Yet patient still was found improved on CPAP- she continues with auto CPAP.  2) depression has lifted- fatigued and grieving - since her husband died. Fatigue improved prn modafinil. Some nights Benadryl.  3)  Ovarian cancer survivor , anxiety and grief. follows with hospice and Cancer center .  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   Plan: Lunesta 1 mg refill. Nuvigil refill. Take Nuvigil in AM hours.  Weaned off Oxycontin. Had some pain in her ribs, but PET scan showed no mets. She is gaining weight - not sure why.  Remained fatigued while not having clinically significant apnea. Has been highly compliant with CPAP use.  Rv in 12 month- we will get the download from auto-titration CPAP, she brings the data from her travel CPAP by smart -phone.   Larey Seat, MD   05-09-2018   Cc Cari Caraway. MD/  Aura Dials, MD

## 2018-05-09 NOTE — Patient Instructions (Addendum)
Eat What You Love, Love what you Eat; Kristin Webb & Kristin Webb or [employer] are some transportation options that Webb work for you. Ask a friend if they can help you get it set up on your phone. Consider eating balanced meals and snacks and having your lunch be your largest meal. You can test your blood sugar fasting and 2 hrs after eating to help you see how your blood sugar control is doing.

## 2018-05-20 ENCOUNTER — Other Ambulatory Visit: Payer: Self-pay | Admitting: Neurology

## 2018-05-20 DIAGNOSIS — Z9989 Dependence on other enabling machines and devices: Secondary | ICD-10-CM

## 2018-05-20 DIAGNOSIS — F5102 Adjustment insomnia: Secondary | ICD-10-CM

## 2018-05-20 DIAGNOSIS — G4719 Other hypersomnia: Secondary | ICD-10-CM

## 2018-05-20 DIAGNOSIS — G4733 Obstructive sleep apnea (adult) (pediatric): Secondary | ICD-10-CM

## 2018-05-23 ENCOUNTER — Telehealth: Payer: Self-pay | Admitting: Neurology

## 2018-05-23 ENCOUNTER — Other Ambulatory Visit: Payer: Self-pay | Admitting: Neurology

## 2018-05-23 ENCOUNTER — Telehealth: Payer: Self-pay

## 2018-05-23 DIAGNOSIS — G4733 Obstructive sleep apnea (adult) (pediatric): Secondary | ICD-10-CM

## 2018-05-23 DIAGNOSIS — G4719 Other hypersomnia: Secondary | ICD-10-CM

## 2018-05-23 DIAGNOSIS — F5102 Adjustment insomnia: Secondary | ICD-10-CM

## 2018-05-23 DIAGNOSIS — Z9989 Dependence on other enabling machines and devices: Secondary | ICD-10-CM

## 2018-05-23 MED ORDER — ARMODAFINIL 250 MG PO TABS: 250.0000 mg | ORAL_TABLET | Freq: Every day | ORAL | 5 refills | Status: DC

## 2018-05-23 NOTE — Telephone Encounter (Signed)
Per 10/14 voice msg return. Tried calling patient and a woman answered and said that no one lives their by this name (adult female). . Appointment was scheduled and CT also was scheduled around this lab appointment time based on her voice message.

## 2018-05-23 NOTE — Telephone Encounter (Signed)
Pt states re: her Armodafinil (NUVIGIL) 250 MG tablet [id number] - [place], [place] [address]. [address] [phone] (Phone) [phone] (Fax)   Does not have the new prescription from Dr Brett Fairy nor does  [facility] [address], [place] [phone] (Phone) [phone] (Fax)    have the prescription for eszopiclone (LUNESTA) 2 MG TABS tablet please call

## 2018-05-23 NOTE — Telephone Encounter (Signed)
[facility]. The lunesta was sent and fax confirmation was received by [facility]arris te[facility] on 04/19/18 for the patient. I have re faxed that same script for the patient to [facility]. Both receipt of  confirmation through fax were received from pharmacies

## 2018-05-25 ENCOUNTER — Inpatient Hospital Stay: Payer: Medicare HMO | Attending: Gynecologic Oncology

## 2018-05-25 ENCOUNTER — Encounter: Payer: Self-pay | Admitting: Obstetrics

## 2018-05-25 DIAGNOSIS — C5701 Malignant neoplasm of right fallopian tube: Secondary | ICD-10-CM | POA: Diagnosis not present

## 2018-05-25 DIAGNOSIS — Z90722 Acquired absence of ovaries, bilateral: Secondary | ICD-10-CM | POA: Insufficient documentation

## 2018-05-25 DIAGNOSIS — Z90710 Acquired absence of both cervix and uterus: Secondary | ICD-10-CM | POA: Insufficient documentation

## 2018-05-25 DIAGNOSIS — C786 Secondary malignant neoplasm of retroperitoneum and peritoneum: Secondary | ICD-10-CM | POA: Insufficient documentation

## 2018-05-25 DIAGNOSIS — Z9221 Personal history of antineoplastic chemotherapy: Secondary | ICD-10-CM | POA: Insufficient documentation

## 2018-05-25 DIAGNOSIS — Z8543 Personal history of malignant neoplasm of ovary: Secondary | ICD-10-CM | POA: Insufficient documentation

## 2018-05-25 LAB — BASIC METABOLIC PANEL
Anion gap: 12 (ref 5–15)
BUN: 16 mg/dL (ref 8–23)
CO2: 26 mmol/L (ref 22–32)
Calcium: 10.1 mg/dL (ref 8.9–10.3)
Chloride: 103 mmol/L (ref 98–111)
Creatinine, Ser: 0.82 mg/dL (ref 0.44–1.00)
GFR calc Af Amer: >60 mL/min (ref >60)
GFR calc non Af Amer: >60 mL/min (ref >60)
Glucose, Bld: 220 mg/dL — ABNORMAL HIGH (ref 70–99)
Potassium: 3.6 mmol/L (ref 3.5–5.1)
Sodium: 141 mmol/L (ref 135–145)

## 2018-05-26 LAB — CA 125: Cancer Antigen (CA) 125: 15.2 U/mL (ref 0.0–38.1)

## 2018-05-26 NOTE — Progress Notes (Deleted)
[facility]    Progress Note: Established Patient Follow-up Visit  CC:  No chief complaint on file.  Oncologic Summary: 1. 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 Random CA 125 elevation January 2019  Question mesenteric nodules and anterior abdominal wall nodule   HPI: Ms. Kristin Webb  is a very nice 72 y.o.  year old P2   Interval History:   *** mid back-pain and some mild intermittent heart-burn     Initial Presentation: She noticed increasing abdominal swelling around the third week of January 2014. Initially, the abdominal swelling was intermittent and would come and go. She related this to initiation of a calcium supplement. The abdominal swelling became more prevalent starting in February with abdominal pain reported also. In addition, she started having increasing constipation and formed stools, which was a change in her routine of 5-6 loose bowel movements a day chronically after her cholecystectomy.  On November 01, 2012, she had a CT scan of the abdomen and pelvis revealing a large midabdominal mass measuring 17 x 22.7 cm x 20 cm.  On November 08, 2012, she underwent an exploratory laparotomy, BSO, appendectomy, infracolic omentectomy, and optimal debulking. Operative findings included: 25 cm right adnexal mass with smooth surface. Surgically absent uterus. Atrophic-appearing left ovary. Normal appearing appendix. Within the omentum there were centimeter nodules scattered throughout the omentum. The remainder of the surfaces were benign. Final pathology revealed:  1. 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 - BENIGN FALLOPIAN TUBE WITH NONSPECIFIC  CHRONIC INFLAMMATION. 2. Ovary and fallopian tube, left - BENIGN OVARY; NEGATIVE FOR ATYPIA OR MALIGNANCY. - BENIGN FALLOPIAN TUBE; NEGATIVE FOR ATYPIA OR MALIGNANCY. 3. Omentum, resection for tumor - HIGH GRADE CARCINOMA, SEE COMMENT. 4. Appendix, Other than Incidental - FIBROUS OBLITERATION OF APPENDICEAL TIP. - NEGATIVE FOR MALIGNANCY.   She completed cycle 6 of 6 planned treatments of carboplatin and taxol on 03/28/13.   CA125 was noted in Jan 2019 to go from single digit baseline to the 20's. Repeat in 09/24/2017 confirmed at 27.  Imaging was recommended including a CT and a PET scan.   The CT 09/30/17 revealed : new clustered soft tissue nodularity in the left lower quadrant in the sigmoid mesentery with a dominant 1.0 cm nodule (series 2/image 73).    PET 10/11/17 revealed : Hypermetabolic nodules in the sigmoid mesentery measure up to 1.5 cm (CT image 158) with an SUV max of 9.1. No abnormal hypermetabolism in the liver, adrenal glands, spleen or pancreas. No hypermetabolic lymph nodes.  She denied any symptoms concerning for recurrence.  She did however note some back pain that radiated around to her front; mid back pain. She denies nausea vomiting and changes in bowel movement.  Since my first meeting her (10/27/17), before her trip to [facility] with her grandkids, we talked about options including biopsy of the anterior abdominal wall versus going to surgery . That has since been performed (11/03/17) and negative, despite having an elevated SUV on PET. There are no corresponding cine clips or images I can review to determine the biopsy location.      --> BIOPSY 11/03/17 FOREIGN BODY GIANT CELL REACTION INVOLVING FIBROADIPOSE TISSUE AND SKELETAL MUSCLE  We obtained 2 more CA125 levels  lower than the Jan/Feb 2019 (10 in April 2019 and 14 in July 2019)  Also repeat imaging with CT scan. - The impression is somewhat worrisome in its wording but I personally reviewed the images with radiology and  explained to the patient overall the imaging was reassuring with the exception of one area    Measurement of disease:  It is hard to know if CA125 is a good MOD (I do not see one drawn preop) therefore exam and imaging are MOD.  Recent Labs    08/25/17 1034 09/24/17 0956 11/22/17 1017 02/22/18 0928 05/25/18 1100  CAN125 20.3 27.1 10.1 14.1 15.2    Radiology: No results found.  No results found.      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  Interval development of large mid abdominal mass highly concerning for right ovarian cancer.  There is mild omental nodularity on the left, and peritoneal disease cannot be completely excluded.  There is no ascites or other evidence of metastatic disease. 2.  Mild associated renal pelvocaliectasis bilaterally without obstruction.  Nonobstructing left renal calculus and a small right renal angiomyolipoma noted incidentally.    11/08/2012 Pathology Results    1. 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 - BENIGN FALLOPIAN TUBE WITH NONSPECIFIC CHRONIC INFLAMMATION. 2. Ovary and fallopian tube, left - BENIGN OVARY; NEGATIVE FOR ATYPIA OR MALIGNANCY. - BENIGN FALLOPIAN TUBE; NEGATIVE FOR ATYPIA OR MALIGNANCY. 3. Omentum, resection for tumor - HIGH GRADE CARCINOMA, SEE COMMENT. 4. Appendix, Other than Incidental - FIBROUS OBLITERATION OF APPENDICEAL TIP. - NEGATIVE FOR MALIGNANCY.    11/08/2012 Surgery    Surgery: Exploratory laparotomy, bilateral salpingo-oophorectomy, appendectomy, infacolic omentectomy, optimal debulking  Surgeons:  Paola A. Alycia Rossetti, MD; Lahoma Crocker, MD   Assistant: Caswell Corwin  Pathology: Bilateral fallopian tubes and ovaries to pathology. Appendix as well as omentum. 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  Operative findings: 25 cm right adnexal mass with smooth surface. Surgically absent uterus. Atrophic-appearing left ovary. Normal appearing appendix. Within the omentum there were centimeter nodules scattered throughout the omentum. The remainder of the surfaces were benign.    12/08/2012 Procedure    Impression:  Placement of a subcutaneous port device.  The catheter tip is in the lower SVC and ready to be used.     12/13/2012 - 03/28/2013 Chemotherapy    s/p 6 cycles of paclitaxel and carboplatin    12/13/2012 - 03/28/2013 Chemotherapy    The patient had 6 cycles of carboplatin and Taxol    03/24/2013 Imaging    US abdomen    04/24/2013 Imaging    CT abdomen Interval resection of the large right pelvic and lower abdominal mass lesion with apparent omentectomy.  No evidence for intraperitoneal free fluid on today's study.  No discernible peritoneal lesions.  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    09/07/2013 Genetic Testing    Patient has genetic testing done for BRCA1/2 panel Results revealed patient has no mutation(s):    07/09/2015 Imaging    CT abdomen 1. 12 mm obstructive calculus at the left ureteropelvic junction  with moderate proximal hydronephrosis. 2. 2 small supraumbilical ventral hernias, one containing a short segment of the mid transverse colon and the other containing a short segment of the mid small bowel. There is no associated evidence to suggest bowel incarceration or obstruction at this time. 3. Tiny locule of gas non dependently in the lumen of the urinary bladder. This is presumably iatrogenic related to recent catheterization for urinalysis. Alternatively, this could be seen in the setting of urinary tract infection with gas-forming  organisms. Clinical correlation for history of recent catheterization is recommended. 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 5. Status post cholecystectomy. 6. Additional incidental findings, as above.     12/11/2016 Imaging    Ct abdomen 1. No evidence of metastatic ovarian cancer. 2. Recurrent subxiphoid ventral abdominal wall hernia containing transverse colon. No evidence of incarceration or obstruction. 3. Stable incidental findings in the liver and kidneys. No recurrent urinary tract calculus. 4. Progressive lower lumbar spondylosis. 5.  Aortic Atherosclerosis (ICD10-I70.0).     08/30/2017 Imaging    MRI thoracic spine 1. At T5-6 there is a small central disc protrusion contacting the ventral thoracic spinal cord. No central canal or foraminal stenosis. 2. At T9-10 there is a small right paracentral disc protrusion. 3.  No acute osseous injury of the thoracic spine. 4. No aggressive osseous lesion to suggest metastatic disease.    09/24/2017 Tumor Marker    Patient's tumor was tested for the following markers: CA-125 Results of the tumor marker test revealed 21.7    09/30/2017 Imaging    CT abdomen 1. New small clustered soft tissue nodules in the left lower quadrant in the sigmoid mesentery, largest 1.0 cm, which could represent recurrent peritoneal tumor implants. No ascites.  2. Midline high ventral abdominal wall hernia containing a portion of the transverse colon is mildly increased in size, and without bowel complication at this time. 3. Chronic findings include: Aortic Atherosclerosis (ICD10-I70.0). Diffuse hepatic steatosis. 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    10/11/2017 PET scan    1. Nodules in the sigmoid mesentery are hypermetabolic and highly worrisome for metastatic disease. 2. Attic steatosis.     11/03/2017 Procedure    Successful CT-guided rectus abdominal muscle mass core  biopsy.    11/04/2017 Cancer Staging    Staging form: Fallopian Tube, AJCC 7th Edition - Clinical: FIGO Stage IIIC, calculated as Stage III (T3, N0, M0) - Signed by [name], MD on 11/04/2017     Current Meds:  Outpatient Encounter Medications as of 06/01/2018  Medication Sig  . Armodafinil (NUVIGIL) 250 MG tablet Take 1 tablet (250 mg total) by mouth daily.  . Carboxymethylcellulose Sodium (EYE DROPS OP) Apply 2 drops to eye daily as needed (dry eye).  . colestipol (COLESTID) 1 g tablet Take 0.5 g by mouth daily. Pt taking 1/2 to 1 tablet daily  . EPINEPHrine (EPIPEN 2-PAK) 0.3 mg/0.3 mL DEVI Inject 0.3 mg into the muscle once as needed (For bee stings.). Reported on 01/20/2016  . eszopiclone (LUNESTA) 2 MG TABS tablet TAKE ONE TABLET BY MOUTH EVERY NIGHT AT BEDTIME AS NEEDED FOR SLEEP (TAKE IMMEDIATELY BEFORE BEDTIME)  . FLUoxetine (PROZAC) 10 MG capsule Take 10 mg by mouth daily. Takes with a 63m capsule for her total dose of 316m  . Marland KitchenLUoxetine (PROZAC) 20 MG capsule Take 20 mg by mouth daily. Takes with a 1057mapsule for her total dose of 9m9m. ibMarland Kitchenprofen (  ADVIL,MOTRIN) 200 MG tablet Take 400-600 mg by mouth every 6 (six) hours as needed for mild pain or moderate pain.  Marland Kitchen MELATONIN PO Take by mouth.  . metFORMIN (GLUCOPHAGE-XR) 500 MG 24 hr tablet Take 1 tablet by mouth daily.  . Multiple Vitamin (MULTIVITAMIN WITH MINERALS) TABS Take 1 tablet by mouth daily.  . naproxen sodium (ALEVE) 220 MG tablet Take 220 mg by mouth.  . rosuvastatin (CRESTOR) 10 MG tablet Take 10 mg by mouth at bedtime.   . triamterene-hydrochlorothiazide (MAXZIDE-25) 37.5-25 MG tablet Take 1 tablet by mouth daily.   Facility-Administered Encounter Medications as of 06/01/2018  Medication  . sodium chloride flush (NS) 0.9 % injection 10 mL    Allergy:  Allergies  Allergen Reactions  . Ambien [Zolpidem Tartrate] Other (See Comments)    Sleep walking  . Bee Venom Hives    Difficulty breathing, carries  an EPI-pen  . Cortisone Nausea And Vomiting, Swelling and Other (See Comments)    INJECTED CORTISONE ONLY, "sick to my stomach, throwing up, hand swelling, and pain."    Social Hx:   Social History   Socioeconomic History  . Marital status: Widowed    Spouse name: [name]  . Number of children: 2  . Years of education: Master's  . Highest education level: Not on file  Occupational History  . Not on file  Social Needs  . Financial resource strain: Not on file  . Food insecurity:    Worry: Not on file    Inability: Not on file  . Transportation needs:    Medical: Not on file    Non-medical: Not on file  Tobacco Use  . Smoking status: Never Smoker  . Smokeless tobacco: Never Used  Substance and Sexual Activity  . Alcohol use: No  . Drug use: No  . Sexual activity: Not on file  Lifestyle  . Physical activity:    Days per week: Not on file    Minutes per session: Not on file  . Stress: Not on file  Relationships  . Social connections:    Talks on phone: Not on file    Gets together: Not on file    Attends religious service: Not on file    Active member of club or organization: Not on file    Attends meetings of clubs or organizations: Not on file    Relationship status: Not on file  . Intimate partner violence:    Fear of current or ex partner: Not on file    Emotionally abused: Not on file    Physically abused: Not on file    Forced sexual activity: Not on file  Other Topics Concern  . Not on file  Social History Narrative   Patient is married [name]) and lives at home with her husband.   Patient has 2 children by birth and 13 children all together.   Patient has a Oceanographer.   Patient is right-handed.   Patient drinks very little caffeine.          Past Surgical Hx:  Past Surgical History:  Procedure Laterality Date  . ABDOMINAL HYSTERECTOMY     early 37s  . APPENDECTOMY     WITH DEBULKING/BSO  . CHOLECYSTECTOMY     early 65s  . CYSTOSCOPY W/  URETERAL STENT PLACEMENT Left 07/09/2015   DUE TO NEPHROLITHIASIS Procedure: CYSTOSCOPY WITH LEFT RETROGRADE PYELOGRAM/ LEFT URETERAL STENT PLACEMENT;  Surgeon: Ardis Hughs, MD;  Location: [facility];  Service: Urology;  Laterality: Left;  .  INCISIONAL HERNIA REPAIR N/A 12/25/2015   WITH MESH Procedure:  INCISIONAL HERNIA REPAIR;  Surgeon: Ralene Ok, MD;  Location: [facility];  Service: General;  Laterality: N/A;  . INSERTION OF MESH N/A 12/25/2015   Procedure: INSERTION OF MESH;  Surgeon: Ralene Ok, MD;  Location: [facility];  Service: General;  Laterality: N/A;  . LAPAROTOMY Bilateral 11/08/2012   Procedure: EXPLORATORY LAPAROTOMY BILATERAL SALPINGO OOPHORECTOMY TUMOR DEBULKING ;  Surgeon: Imagene Gurney A. Alycia Rossetti, MD;  Location: [facility];  Service: Gynecology;  Laterality: Bilateral;  APPENDECTOMY / OMENTECTOMY  . LAPAROTOMY N/A 12/25/2015   Procedure: EXPLORATORY LAPAROTOMY;  Surgeon: Ralene Ok, MD;  Location: [facility];  Service: General;  Laterality: N/A;  . LYSIS OF ADHESION N/A 12/25/2015   Procedure: LYSIS OF ADHESION;  Surgeon: Ralene Ok, MD;  Location: [facility];  Service: General;  Laterality: N/A;  . PARTIAL HYSTERECTOMY  2006  . TRIGGER FINGER RELEASE      Past Medical Hx:  Past Medical History:  Diagnosis Date  . Burning mouth syndrome   . Chronic fatigue   . Constipation   . Diarrhea    in the past after gallbladder removal  . Fibromyalgia   . GERD (gastroesophageal reflux disease)   . History of kidney stones 07/2015  . Hyperlipidemia   . Hypertension    borderline not on meds   . Insomnia secondary to depression with anxiety   . Lumbar disc disease   . Newly diagnosed diabetes ([place]) 05/09/2018  . Ovarian cancer ([place]) 2014  . Pelvic mass in female   . Pneumonia    hx of pneumonia as an infant  . PONV (postoperative nausea and vomiting)   . Shortness of breath    with exertion   . Sleep apnea    ;neurologist informed pt she no longer needed her CPAP machine  .  Yeast infection     Past Gynecological History:   GYNECOLOGIC HISTORY:  No LMP recorded. Patient has had a hysterectomy.  Family Hx:  Family History  Problem Relation Age of Onset  . Lung cancer Mother 75  . High blood pressure Mother   . High Cholesterol Mother   . Lung cancer Father 65       2 ppd smoker  . Parkinson's disease Father   . Kidney disease Unknown     Review of Systems:  Review of Systems - Oncology    Vitals:  There were no vitals taken for this visit.  Physical Exam: ECOG PERFORMANCE STATUS: {CHL ONC ECOG MV:[id number]}   General :  Well developed, 72 y.o., female in no apparent distress HEENT:  Normocephalic/atraumatic, symmetric, EOMI, eyelids normal Neck:   Supple, no masses.  Lymphatics:  No cervical/ submandibular/ supraclavicular/ infraclavicular/ inguinal adenopathy Respiratory:  Respirations unlabored, no use of accessory muscles CV:   Deferred Breast:  Deferred Musculoskeletal: No CVA tenderness, normal muscle strength. Abdomen:  *** Soft, non-tender and nondistended. No evidence of hernia. No masses. Extremities:  No lymphedema, no erythema, non-tender. Skin:   Normal inspection Neuro/Psych:  No focal motor deficit, no abnormal mental status. Normal gait. Normal affect. Alert and oriented to person, place, and time  Genito Urinary: Vulva: ***Normal external female genitalia.  Bladder/urethra: Urethral meatus normal in size and location. No lesions or   masses, well supported bladder ***Speculum exam: Vagina: ***No lesion, no discharge, no bleeding. Cervix: ***Normal appearing, no lesions. Bimanual exam: *** Uterus: ***Normal size, mobile.  Adnexa: ***No masses. Rectovaginal:  ***Good tone, no masses, no cul de  sac nodularity, no parametrial involvement or nodularity.    Assessment/Plan: 1. Data reviewed o After personally reviewing the images with radiology and then with the patient in the office today we all agree on continued close  followup. i. The new lesion seen is a 14.68m nodule in the mesentary that may have been absent, smaller, or obscured in the prior imaging ii. The other lesions in full review of all imaging since early 2019 appear overall stable to smaller (again on verbal review with radiology) o Her tumor markers are reviewed today. Those seem to be reassuring 2. Eval for recurrent disease o We discussed tissue diagnosis important i. Approach could be via laparotomy, however I worry given the small size of the sigmoid mesentary lesions, these may be missed intraoperatively.  ii. We have in visits past talked about setting up surgery with a diagnostic laparoscopy  o The abdominal wall lesion, to the right of midline, has been biopsied and negative. o I recommended continued close followup with repeat imaging in 3 months  i. CT scan of the abdomen and pelvis along with a PET scan on May 27, 2018  3. Treatment options should recurrence be confirmed o We previously discussed chemo followed by PARP  o If she has carcinomatosis I support upfront chemo given the small size of the lesions and the possibility the tumor may be highly chemo-sensitive. o Surgical debulking is an option too given her platinum sensitivity 4. Hernia o We discussed her hernia. She is bothered by this cosmetically, but in no pain and no GI symptoms of concern. o The hernia is quite high and I do not know how strongly I would push to have that repaired at the time of any procedure we may do. o I offered to send her to her surgeon who has previously repaired a hernia for her. She declined at this time. 5. Genetics o She tells me she was tested for genetic mutation. I would like to see if this was BRCA only and if the testing should be updated. 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 6. Followup o RTC pending above  Face to face time with patient was 15 minutes. Over 50% of this time was spent on counseling and  coordination of care.   SIsabel Caprice MD  05/26/2018, 4:01 PM  Cc:  WCari Caraway MD (PCP)

## 2018-05-27 ENCOUNTER — Encounter (HOSPITAL_COMMUNITY): Payer: Self-pay | Admitting: Radiology

## 2018-05-27 ENCOUNTER — Ambulatory Visit (HOSPITAL_COMMUNITY)
Admission: RE | Admit: 2018-05-27 | Discharge: 2018-05-27 | Disposition: A | Discharge status: Home / Self Care | Payer: Medicare HMO | Source: Ambulatory Visit | Attending: Obstetrics | Admitting: Obstetrics

## 2018-05-27 ENCOUNTER — Ambulatory Visit (HOSPITAL_COMMUNITY): Payer: Medicare HMO

## 2018-05-27 ENCOUNTER — Ambulatory Visit (HOSPITAL_COMMUNITY)
Admission: RE | Admit: 2018-05-27 | Discharge: 2018-05-27 | Disposition: A | Discharge status: Home / Self Care | Payer: Medicare HMO | Source: Ambulatory Visit | Attending: Gynecologic Oncology | Admitting: Gynecologic Oncology

## 2018-05-27 DIAGNOSIS — D1771 Benign lipomatous neoplasm of kidney: Secondary | ICD-10-CM | POA: Insufficient documentation

## 2018-05-27 DIAGNOSIS — C5701 Malignant neoplasm of right fallopian tube: Secondary | ICD-10-CM | POA: Insufficient documentation

## 2018-05-27 DIAGNOSIS — C569 Malignant neoplasm of unspecified ovary: Secondary | ICD-10-CM | POA: Diagnosis not present

## 2018-05-27 DIAGNOSIS — K760 Fatty (change of) liver, not elsewhere classified: Secondary | ICD-10-CM | POA: Diagnosis not present

## 2018-05-27 DIAGNOSIS — I700 Atherosclerosis of aorta: Secondary | ICD-10-CM | POA: Diagnosis not present

## 2018-05-27 LAB — GLUCOSE, CAPILLARY: Glucose-Capillary: 132 mg/dL — ABNORMAL HIGH (ref 70–99)

## 2018-05-27 MED ORDER — IOHEXOL 300 MG/ML  SOLN
100.0000 mL | Freq: Once | INTRAMUSCULAR | Status: AC | PRN
  Administered 2018-05-27: 100 mL via INTRAVENOUS

## 2018-05-27 MED ORDER — FLUDEOXYGLUCOSE F - 18 (FDG) INJECTION
10.4000 | Freq: Once | INTRAVENOUS | Status: AC
  Administered 2018-05-27: 10.4 via INTRAVENOUS

## 2018-05-27 MED ORDER — SODIUM CHLORIDE 0.9 % IJ SOLN
INTRAMUSCULAR | Status: AC
  Filled 2018-05-27: qty 50

## 2018-05-30 ENCOUNTER — Telehealth: Payer: Self-pay

## 2018-05-30 NOTE — Telephone Encounter (Signed)
Incoming call from patient - needs to reschedule to later in day on Wed appt.  Rescheduled to 2:15 pm. No other needs at this time per pt.

## 2018-05-31 ENCOUNTER — Telehealth: Payer: Self-pay | Admitting: Oncology

## 2018-05-31 NOTE — Telephone Encounter (Signed)
Called [facility] and spoke to [place].  Requested that the radiologist compare the 05/27/18 PET/CT to the 10/11/17 PET and 09/30/17 CT.  She said she would put a message in Dr. Ronnell Freshwater que.

## 2018-05-31 NOTE — Progress Notes (Signed)
[place] at [facility]    Progress Note: Established Patient Follow-up Visit  CC:  Chief Complaint  Patient presents with  . Carcinoma of right fallopian tube [facility])   Oncologic Summary: 1. 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 Random CA 125 elevation January 2019  Question mesenteric nodules and anterior abdominal wall nodule 3. GeneDx Breast/Ovary Panel negative (including BRCA, MMR's, RAD51 etc)  Myriad BRACAnalysis  Negative for BRCA1/2 in tumor   HPI: Ms. Kristin Webb  is a very nice 72 y.o.  year old P2   Interval History:   No new symptoms or complaints. 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  Since her last visit we have another CA125 and another PET / CT noted below.   Initial Presentation: She noticed increasing abdominal swelling around the third week of January 2014. Initially, the abdominal swelling was intermittent and would come and go. She related this to initiation of a calcium supplement. The abdominal swelling became more prevalent starting in February with abdominal pain reported also. In addition, she started having increasing constipation and formed stools, which was a change in her routine of 5-6 loose bowel movements a day chronically after her cholecystectomy.  On November 01, 2012, she had a CT scan of the abdomen and pelvis revealing a large midabdominal mass measuring 17 x 22.7 cm x 20 cm.  On November 08, 2012, she underwent an exploratory laparotomy, BSO, appendectomy, infracolic omentectomy, and optimal debulking. Operative findings included: 25 cm right adnexal mass with smooth surface. Surgically absent uterus. Atrophic-appearing left ovary. Normal appearing  appendix. Within the omentum there were centimeter nodules scattered throughout the omentum. The remainder of the surfaces were benign. Final pathology revealed:  1. 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 - BENIGN FALLOPIAN TUBE WITH NONSPECIFIC CHRONIC INFLAMMATION. 2. Ovary and fallopian tube, left - BENIGN OVARY; NEGATIVE FOR ATYPIA OR MALIGNANCY. - BENIGN FALLOPIAN TUBE; NEGATIVE FOR ATYPIA OR MALIGNANCY. 3. Omentum, resection for tumor - HIGH GRADE CARCINOMA, SEE COMMENT. 4. Appendix, Other than Incidental - FIBROUS OBLITERATION OF APPENDICEAL TIP. - NEGATIVE FOR MALIGNANCY.   She completed cycle 6 of 6 planned treatments of carboplatin and taxol on 03/28/13.   CA125 was noted in Jan 2019 to go from single digit baseline to the 20's. Repeat in 09/24/2017 confirmed at 27.  Imaging was recommended including a CT and a PET scan.   The CT 09/30/17 revealed : new clustered soft tissue nodularity in the left lower quadrant in the sigmoid mesentery with a dominant 1.0 cm nodule (series 2/image 73).    PET 10/11/17 revealed : Hypermetabolic nodules in the sigmoid mesentery measure up to 1.5 cm (CT image 158) with an SUV max of 9.1. No abnormal hypermetabolism in the liver, adrenal glands, spleen or pancreas. No hypermetabolic lymph nodes.  She denied any symptoms concerning for recurrence.  She did however note some back pain that radiated around to her front; mid back pain. She denies nausea vomiting and changes in bowel movement.  Since my first meeting her (10/27/17), before her trip to [place] with her grandkids, we talked about options including biopsy of the anterior abdominal wall versus going  to surgery . That was performed (11/03/17) and negative, despite having an elevated SUV on PET. There are no corresponding cine clips or images I can review to  determine the biopsy location.      --> BIOPSY 11/03/17 FOREIGN BODY GIANT CELL REACTION INVOLVING FIBROADIPOSE TISSUE AND SKELETAL MUSCLE  We obtained 2 more CA125 levels lower than the Jan/Feb 2019 (10 in April 2019 and 14 in July 2019)  Also repeat imaging with CT scan. - The impression is somewhat worrisome in its wording but I personally reviewed the images with radiology and explained to the patient overall the imaging was reassuring with the exception of one area    Measurement of disease:  It is hard to know if CA125 is a good MOD (I do not see one drawn preop) therefore exam and imaging are MOD.  Recent Labs    08/25/17 1034 09/24/17 0956 11/22/17 1017 02/22/18 0928 05/25/18 1100  CAN125 20.3 27.1 10.1 14.1 15.2    Radiology: Ct Abdomen Pelvis W Contrast  Result Date: 05/27/2018 CLINICAL DATA:  Ovarian/fallopian tube cancer in 2014. Cholecystectomy. Appendectomy. Hysterectomy. EXAM: CT ABDOMEN AND PELVIS WITH CONTRAST TECHNIQUE: Multidetector CT imaging of the abdomen and pelvis was performed using the standard protocol following bolus administration of intravenous contrast. CONTRAST:  125m OMNIPAQUE IOHEXOL 300 MG/ML  SOLN COMPARISON:  Today's PET, dictated separately. Most recent CT of 02/24/2018. FINDINGS: Lower chest: Volume loss within the anterior lung bases bilaterally. Normal heart size without pericardial or pleural effusion. Hepatobiliary: Hepatic steatosis. No focal liver lesion. Cholecystectomy, without biliary ductal dilatation. Pancreas: Normal, without mass or ductal dilatation. Spleen: Normal in size, without focal abnormality. Adrenals/Urinary Tract: Normal adrenal glands. Upper pole left renal too small to characterize lesion. 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 Normal urinary bladder. Stomach/Bowel: Normal stomach, without wall thickening. Transverse colon positioned within an area of ventral abdominal wall laxity, similar. Normal terminal  ileum. Normal small bowel. Vascular/Lymphatic: Aortic atherosclerosis. Similar appearance of increased density in the small bowel mesenteric with small nodes within. No pelvic sidewall adenopathy. Reproductive: Hysterectomy.  No adnexal mass. Other: No significant free fluid. The cephalad most sigmoid mesocolon node measures 10 mm on image 66/2 and is similar to on the prior diagnostic CT. More inferior and lateral nodule measures 9 mm on image 74/2 versus 1.5 cm on 02/24/2018. More central mesocolon nodule measures 12 mm on image 72/2 versus 15 mm on the prior. Anterior left pelvic omental nodule measures approximately 4 mm on image 60/2 and is similar to on the prior. No new omental nodules identified. Fat containing left inguinal hernia. The area of right rectus muscular hypermetabolism may be correlated with subtle hyperenhancement on image 74/2. No well-defined dominant mass. Musculoskeletal: Lumbosacral spondylosis. IMPRESSION: 1. Since 02/24/2018, decreased size of peritoneal nodules centered in the sigmoid mesocolon. 2. No evidence of new or progressive disease. 3. Hepatic steatosis. 4. Subcentimeter right renal angiomyolipoma, similar. 5.  Aortic Atherosclerosis (ICD10-I70.0). 6. Ventral abdominal wall laxity containing transverse colon, similar. Electronically Signed   By: KAbigail MiyamotoM.D.   On: 05/27/2018 14:51   Nm Pet Image Restag (ps) Skull Base To Thigh  Addendum Date: 05/31/2018   ADDENDUM REPORT: 05/31/2018 15:06 ADDENDUM: Clinical service requested comparison to the 11/24/2017 CT. Index 10 mm nodule within the sigmoid mesocolon was similar to the 02/24/2018 CT, and as described on that exam, increased from 7 mm on 11/24/2017. More inferior nodule within the mesocolon measures 12 mm today on image 156/4  and 8 mm on 11/24/2017. Electronically Signed   By: Abigail Miyamoto M.D.   On: 05/31/2018 15:06   Result Date: 05/31/2018 CLINICAL DATA:  Subsequent treatment strategy for restaging of ovarian  cancer. Questionable recurrence. Follow-up of nodules in pelvis. Carcinoma of right fallopian tube. EXAM: NUCLEAR MEDICINE PET SKULL BASE TO THIGH TECHNIQUE: 10.4 mCi F-18 FDG was injected intravenously. Full-ring PET imaging was performed from the skull base to thigh after the radiotracer. CT data was obtained and used for attenuation correction and anatomic localization. Fasting blood glucose: 132 mg/dl COMPARISON:  Today's abdominopelvic CT, dictated separately. Prior abdominopelvic CT of 02/24/2018. Prior PET of 10/11/2017. FINDINGS: Mediastinal blood pool activity: SUV max 3.4 NECK: No areas of abnormal hypermetabolism. Incidental CT findings: No cervical adenopathy. CHEST: No pulmonary parenchymal or thoracic nodal hypermetabolism. Incidental CT findings: A right-sided Port-A-Cath terminates at the mid SVC. Faint lad coronary artery calcification. Aortic atherosclerosis. Anterior volume loss at both lung bases. ABDOMEN/PELVIS: A nodule within the sigmoid mesocolon measures 10 mm and a S.U.V. max of 6.0 on image 146/4. This is similar in size to 02/24/2018 (when remeasured). Newly hypermetabolic since the prior PET. A more inferior and lateral pericolonic nodule measures 9 mm and a S.U.V. max of 7.3 on image 155/4. This is felt to correspond to the 10 mm nodule on 02/24/2018 CT. 1.5 cm and a S.U.V. max of 9.1 on 10/11/2017. Incidental CT findings: More central sigmoid mesocolon nodule measures 12 mm and is not significantly hypermetabolic on image 559/7. Compare 1.4 cm on 02/24/2018 (when remeasured). Cholecystectomy. Moderate hepatic steatosis. Ventral abdominal wall laxity contains nonobstructive transverse colon. Small bowel mesenteric findings which are nonspecific but can be seen with mesenteric adenitis/panniculitis. Otherwise deferred to today's dedicated CT. SKELETON: A focus of hypermetabolism within the right paracentral pelvic rectus musculature measures a S.U.V. max of 6.3 on image 155/4. Compare a  S.U.V. max of 4.9 on the prior exam (when remeasured). No marrow hypermetabolism identified. Incidental CT findings: none IMPRESSION: 1. Redemonstration of hypermetabolic nodules within the sigmoid mesocolon. Mild response to therapy relative to CT of 02/24/2018. Mixed response to therapy compared to the most recent PET of 10/11/2017. 2. Hypermetabolism within the right pelvic rectus musculature, increased since the prior PET. 3. No extrapelvic hypermetabolic disease identified. 4. Coronary artery atherosclerosis. Aortic Atherosclerosis (ICD10-I70.0). 5. Please see abdominopelvic CT, dictated separately. Electronically Signed: By: Abigail Miyamoto M.D. On: 05/27/2018 14:52    Ct Abdomen Pelvis W Contrast  Result Date: 05/27/2018 CLINICAL DATA:  Ovarian/fallopian tube cancer in 2014. Cholecystectomy. Appendectomy. Hysterectomy. EXAM: CT ABDOMEN AND PELVIS WITH CONTRAST TECHNIQUE: Multidetector CT imaging of the abdomen and pelvis was performed using the standard protocol following bolus administration of intravenous contrast. CONTRAST:  17m OMNIPAQUE IOHEXOL 300 MG/ML  SOLN COMPARISON:  Today's PET, dictated separately. Most recent CT of 02/24/2018. FINDINGS: Lower chest: Volume loss within the anterior lung bases bilaterally. Normal heart size without pericardial or pleural effusion. Hepatobiliary: Hepatic steatosis. No focal liver lesion. Cholecystectomy, without biliary ductal dilatation. Pancreas: Normal, without mass or ductal dilatation. Spleen: Normal in size, without focal abnormality. Adrenals/Urinary Tract: Normal adrenal glands. Upper pole left renal too small to characterize lesion. 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 Normal urinary bladder. Stomach/Bowel: Normal stomach, without wall thickening. Transverse colon positioned within an area of ventral abdominal wall laxity, similar. Normal terminal ileum. Normal small bowel. Vascular/Lymphatic: Aortic atherosclerosis. Similar appearance  of increased density in the small bowel mesenteric with small nodes within. No pelvic  sidewall adenopathy. Reproductive: Hysterectomy.  No adnexal mass. Other: No significant free fluid. The cephalad most sigmoid mesocolon node measures 10 mm on image 66/2 and is similar to on the prior diagnostic CT. More inferior and lateral nodule measures 9 mm on image 74/2 versus 1.5 cm on 02/24/2018. More central mesocolon nodule measures 12 mm on image 72/2 versus 15 mm on the prior. Anterior left pelvic omental nodule measures approximately 4 mm on image 60/2 and is similar to on the prior. No new omental nodules identified. Fat containing left inguinal hernia. The area of right rectus muscular hypermetabolism may be correlated with subtle hyperenhancement on image 74/2. No well-defined dominant mass. Musculoskeletal: Lumbosacral spondylosis. IMPRESSION: 1. Since 02/24/2018, decreased size of peritoneal nodules centered in the sigmoid mesocolon. 2. No evidence of new or progressive disease. 3. Hepatic steatosis. 4. Subcentimeter right renal angiomyolipoma, similar. 5.  Aortic Atherosclerosis (ICD10-I70.0). 6. Ventral abdominal wall laxity containing transverse colon, similar. Electronically Signed   By: Abigail Miyamoto M.D.   On: 05/27/2018 14:51   Nm Pet Image Restag (ps) Skull Base To Thigh  Addendum Date: 05/31/2018   ADDENDUM REPORT: 05/31/2018 15:06 ADDENDUM: Clinical service requested comparison to the 11/24/2017 CT. Index 10 mm nodule within the sigmoid mesocolon was similar to the 02/24/2018 CT, and as described on that exam, increased from 7 mm on 11/24/2017. More inferior nodule within the mesocolon measures 12 mm today on image 156/4 and 8 mm on 11/24/2017. Electronically Signed   By: Abigail Miyamoto M.D.   On: 05/31/2018 15:06   Result Date: 05/31/2018 CLINICAL DATA:  Subsequent treatment strategy for restaging of ovarian cancer. Questionable recurrence. Follow-up of nodules in pelvis. Carcinoma of right  fallopian tube. EXAM: NUCLEAR MEDICINE PET SKULL BASE TO THIGH TECHNIQUE: 10.4 mCi F-18 FDG was injected intravenously. Full-ring PET imaging was performed from the skull base to thigh after the radiotracer. CT data was obtained and used for attenuation correction and anatomic localization. Fasting blood glucose: 132 mg/dl COMPARISON:  Today's abdominopelvic CT, dictated separately. Prior abdominopelvic CT of 02/24/2018. Prior PET of 10/11/2017. FINDINGS: Mediastinal blood pool activity: SUV max 3.4 NECK: No areas of abnormal hypermetabolism. Incidental CT findings: No cervical adenopathy. CHEST: No pulmonary parenchymal or thoracic nodal hypermetabolism. Incidental CT findings: A right-sided Port-A-Cath terminates at the mid SVC. Faint lad coronary artery calcification. Aortic atherosclerosis. Anterior volume loss at both lung bases. ABDOMEN/PELVIS: A nodule within the sigmoid mesocolon measures 10 mm and a S.U.V. max of 6.0 on image 146/4. This is similar in size to 02/24/2018 (when remeasured). Newly hypermetabolic since the prior PET. A more inferior and lateral pericolonic nodule measures 9 mm and a S.U.V. max of 7.3 on image 155/4. This is felt to correspond to the 10 mm nodule on 02/24/2018 CT. 1.5 cm and a S.U.V. max of 9.1 on 10/11/2017. Incidental CT findings: More central sigmoid mesocolon nodule measures 12 mm and is not significantly hypermetabolic on image 785/8. Compare 1.4 cm on 02/24/2018 (when remeasured). Cholecystectomy. Moderate hepatic steatosis. Ventral abdominal wall laxity contains nonobstructive transverse colon. Small bowel mesenteric findings which are nonspecific but can be seen with mesenteric adenitis/panniculitis. Otherwise deferred to today's dedicated CT. SKELETON: A focus of hypermetabolism within the right paracentral pelvic rectus musculature measures a S.U.V. max of 6.3 on image 155/4. Compare a S.U.V. max of 4.9 on the prior exam (when remeasured). No marrow hypermetabolism  identified. Incidental CT findings: none IMPRESSION: 1. Redemonstration of hypermetabolic nodules within the sigmoid mesocolon. Mild response to therapy  relative to CT of 02/24/2018. Mixed response to therapy compared to the most recent PET of 10/11/2017. 2. Hypermetabolism within the right pelvic rectus musculature, increased since the prior PET. 3. No extrapelvic hypermetabolic disease identified. 4. Coronary artery atherosclerosis. Aortic Atherosclerosis (ICD10-I70.0). 5. Please see abdominopelvic CT, dictated separately. Electronically Signed: By: Abigail Miyamoto M.D. 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  Interval development of large mid abdominal mass highly concerning for right ovarian cancer.  There is mild omental nodularity on the left, and peritoneal disease cannot be completely excluded.  There is no ascites or other evidence of metastatic disease. 2.  Mild associated renal pelvocaliectasis bilaterally without obstruction.  Nonobstructing left renal calculus and a small right renal angiomyolipoma noted incidentally.    11/08/2012 Pathology Results    1. 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 - BENIGN FALLOPIAN TUBE WITH NONSPECIFIC CHRONIC INFLAMMATION. 2. Ovary and fallopian tube, left - BENIGN OVARY; NEGATIVE FOR ATYPIA OR MALIGNANCY. - BENIGN FALLOPIAN TUBE; NEGATIVE FOR ATYPIA OR MALIGNANCY. 3. Omentum, resection for tumor - HIGH GRADE CARCINOMA, SEE COMMENT. 4. Appendix, Other than Incidental - FIBROUS OBLITERATION OF APPENDICEAL TIP. - NEGATIVE FOR MALIGNANCY.    11/08/2012 Surgery    Surgery:  Exploratory laparotomy, bilateral salpingo-oophorectomy, appendectomy, infacolic omentectomy, optimal debulking  Surgeons:  Paola A. Alycia Rossetti, MD; Lahoma Crocker, MD   Assistant: Caswell Corwin  Pathology: Bilateral fallopian tubes and ovaries to pathology. Appendix as well as omentum. 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  Operative findings: 25 cm right adnexal mass with smooth surface. Surgically absent uterus. Atrophic-appearing left ovary. Normal appearing appendix. Within the omentum there were centimeter nodules scattered throughout the omentum. The remainder of the surfaces were benign.    12/08/2012 Procedure    Impression:  Placement of a subcutaneous port device.  The catheter tip is in the lower SVC and ready to be used.     12/13/2012 - 03/28/2013 Chemotherapy    s/p 6 cycles of paclitaxel and carboplatin    12/13/2012 - 03/28/2013 Chemotherapy    The patient had 6 cycles of carboplatin and Taxol    03/24/2013 Imaging    US abdomen    04/24/2013 Imaging    CT abdomen Interval resection of the large right pelvic and lower abdominal mass lesion with apparent omentectomy.  No evidence for intraperitoneal free fluid on today's study.  No discernible peritoneal lesions.  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    09/07/2013 Genetic Testing    Patient has genetic testing done for BRCA1/2 panel Results revealed patient has no mutation(s):    07/09/2015 Imaging    CT abdomen 1. 12 mm obstructive calculus at the left ureteropelvic junction with moderate proximal hydronephrosis. 2. 2 small supraumbilical ventral hernias, one containing a short segment of the mid transverse colon and the other containing a short segment of the mid small bowel. There is no associated evidence to suggest bowel incarceration or obstruction at this time. 3. Tiny locule of gas non dependently in the lumen of the  urinary bladder. This is presumably iatrogenic related to recent catheterization for urinalysis. Alternatively, this could be seen in the setting of urinary tract infection with gas-forming organisms. Clinical correlation for history of recent catheterization is recommended. 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 5. Status post cholecystectomy. 6. Additional incidental findings, as above.     12/11/2016 Imaging    Ct abdomen 1. No evidence of metastatic ovarian cancer. 2. Recurrent subxiphoid ventral abdominal wall hernia containing transverse colon. No evidence of incarceration or obstruction. 3. Stable incidental findings in the liver and kidneys. No recurrent urinary tract calculus. 4. Progressive lower lumbar spondylosis. 5.  Aortic Atherosclerosis (ICD10-I70.0).     08/30/2017 Imaging    MRI thoracic spine 1. At T5-6 there is a small central disc protrusion contacting the ventral thoracic spinal cord. No central canal or foraminal stenosis. 2. At T9-10 there is a small right paracentral disc protrusion. 3.  No acute osseous injury of the thoracic spine. 4. No aggressive osseous lesion to suggest metastatic disease.    09/24/2017 Tumor Marker    Patient's tumor was tested for the following markers: CA-125 Results of the tumor marker test revealed 21.7    09/30/2017 Imaging    CT abdomen 1. New small clustered soft tissue nodules in the left lower quadrant in the sigmoid mesentery, largest 1.0 cm, which could represent recurrent peritoneal tumor implants. No ascites.  2. Midline high ventral abdominal wall hernia containing a portion of the transverse colon is mildly increased in size, and without bowel complication at this time. 3. Chronic findings include: Aortic Atherosclerosis (ICD10-I70.0). Diffuse hepatic steatosis. 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    10/11/2017 PET scan    1. Nodules in the  sigmoid mesentery are hypermetabolic and highly worrisome for metastatic disease. 2. Attic steatosis.     11/03/2017 Procedure    Successful CT-guided rectus abdominal muscle mass core biopsy.    11/04/2017 Cancer Staging    Staging form: Fallopian Tube, AJCC 7th Edition - Clinical: FIGO Stage IIIC, calculated as Stage III (T3, N0, M0) - Signed by [name], MD on 11/04/2017     Current Meds:  Outpatient Encounter Medications as of 06/01/2018  Medication Sig  . Armodafinil (NUVIGIL) 250 MG tablet Take 1 tablet (250 mg total) by mouth daily.  . Carboxymethylcellulose Sodium (EYE DROPS OP) Apply 2 drops to eye daily as needed (dry eye).  . colestipol (COLESTID) 1 g tablet Take 0.5 g by mouth daily. Pt taking 1/2 to 1 tablet daily  . EPINEPHrine (EPIPEN 2-PAK) 0.3 mg/0.3 mL DEVI Inject 0.3 mg into the muscle once as needed (For bee stings.). Reported on 01/20/2016  . eszopiclone (LUNESTA) 2 MG TABS tablet TAKE ONE TABLET BY MOUTH EVERY NIGHT AT BEDTIME AS NEEDED FOR SLEEP (TAKE IMMEDIATELY BEFORE BEDTIME)  . FLUoxetine (PROZAC) 10 MG capsule Take 10 mg by mouth daily. Takes with a 23m capsule for her total dose of 343m  . Marland KitchenLUoxetine (PROZAC) 20 MG capsule Take 20 mg by mouth daily. Takes with a 1072mapsule for her total dose of 12m18m. ibMarland Kitchenprofen (ADVIL,MOTRIN) 200 MG tablet Take 400-600 mg by mouth every 6 (six) hours as needed for mild pain or moderate pain.  . MEMarland KitchenATONIN PO Take by mouth.  . metFORMIN (GLUCOPHAGE-XR) 500 MG 24 hr tablet Take 1 tablet by mouth daily.  . Multiple Vitamin (MULTIVITAMIN WITH MINERALS) TABS Take 1 tablet by mouth daily.  .Marland Kitchen  naproxen sodium (ALEVE) 220 MG tablet Take 220 mg by mouth.  . rosuvastatin (CRESTOR) 10 MG tablet Take 10 mg by mouth at bedtime.   . triamterene-hydrochlorothiazide (MAXZIDE-25) 37.5-25 MG tablet Take 1 tablet by mouth daily.   Facility-Administered Encounter Medications as of 06/01/2018  Medication  . sodium chloride flush (NS) 0.9  % injection 10 mL    Allergy:  Allergies  Allergen Reactions  . Ambien [Zolpidem Tartrate] Other (See Comments)    Sleep walking  . Bee Venom Hives    Difficulty breathing, carries an EPI-pen  . Cortisone Nausea And Vomiting, Swelling and Other (See Comments)    INJECTED CORTISONE ONLY, "sick to my stomach, throwing up, hand swelling, and pain."    Social Hx:   Social History   Socioeconomic History  . Marital status: Widowed    Spouse name: [name]  . Number of children: 2  . Years of education: Master's  . Highest education level: Not on file  Occupational History  . Not on file  Social Needs  . Financial resource strain: Not on file  . Food insecurity:    Worry: Not on file    Inability: Not on file  . Transportation needs:    Medical: Not on file    Non-medical: Not on file  Tobacco Use  . Smoking status: Never Smoker  . Smokeless tobacco: Never Used  Substance and Sexual Activity  . Alcohol use: No  . Drug use: No  . Sexual activity: Not on file  Lifestyle  . Physical activity:    Days per week: Not on file    Minutes per session: Not on file  . Stress: Not on file  Relationships  . Social connections:    Talks on phone: Not on file    Gets together: Not on file    Attends religious service: Not on file    Active member of club or organization: Not on file    Attends meetings of clubs or organizations: Not on file    Relationship status: Not on file  . Intimate partner violence:    Fear of current or ex partner: Not on file    Emotionally abused: Not on file    Physically abused: Not on file    Forced sexual activity: Not on file  Other Topics Concern  . Not on file  Social History Narrative   Patient is married [name]) and lives at home with her husband.   Patient has 2 children by birth and 13 children all together.   Patient has a Oceanographer.   Patient is right-handed.   Patient drinks very little caffeine.          Past Surgical Hx:  Past  Surgical History:  Procedure Laterality Date  . ABDOMINAL HYSTERECTOMY     early 45s  . APPENDECTOMY     WITH DEBULKING/BSO  . CHOLECYSTECTOMY     early 37s  . CYSTOSCOPY W/ URETERAL STENT PLACEMENT Left 07/09/2015   DUE TO NEPHROLITHIASIS Procedure: CYSTOSCOPY WITH LEFT RETROGRADE PYELOGRAM/ LEFT URETERAL STENT PLACEMENT;  Surgeon: Ardis Hughs, MD;  Location: [facility];  Service: Urology;  Laterality: Left;  . INCISIONAL HERNIA REPAIR N/A 12/25/2015   WITH MESH Procedure:  INCISIONAL HERNIA REPAIR;  Surgeon: Ralene Ok, MD;  Location: [facility];  Service: General;  Laterality: N/A;  . INSERTION OF MESH N/A 12/25/2015   Procedure: INSERTION OF MESH;  Surgeon: Ralene Ok, MD;  Location: [facility];  Service: General;  Laterality: N/A;  .  LAPAROTOMY Bilateral 11/08/2012   Procedure: EXPLORATORY LAPAROTOMY BILATERAL SALPINGO OOPHORECTOMY TUMOR DEBULKING ;  Surgeon: Imagene Gurney A. Alycia Rossetti, MD;  Location: [facility];  Service: Gynecology;  Laterality: Bilateral;  APPENDECTOMY / OMENTECTOMY  . LAPAROTOMY N/A 12/25/2015   Procedure: EXPLORATORY LAPAROTOMY;  Surgeon: Ralene Ok, MD;  Location: [facility];  Service: General;  Laterality: N/A;  . LYSIS OF ADHESION N/A 12/25/2015   Procedure: LYSIS OF ADHESION;  Surgeon: Ralene Ok, MD;  Location: [facility];  Service: General;  Laterality: N/A;  . PARTIAL HYSTERECTOMY  2006  . TRIGGER FINGER RELEASE      Past Medical Hx:  Past Medical History:  Diagnosis Date  . Burning mouth syndrome   . Chronic fatigue   . Constipation   . Diarrhea    in the past after gallbladder removal  . Fibromyalgia   . GERD (gastroesophageal reflux disease)   . History of kidney stones 07/2015  . Hyperlipidemia   . Hypertension    borderline not on meds   . Insomnia secondary to depression with anxiety   . Lumbar disc disease   . Newly diagnosed diabetes ([place]) 05/09/2018  . Ovarian cancer ([place]) 2014  . Pelvic mass in female   . Pneumonia    hx of pneumonia as an  infant  . PONV (postoperative nausea and vomiting)   . Shortness of breath    with exertion   . Sleep apnea    ;neurologist informed pt she no longer needed her CPAP machine  . Yeast infection     Past Gynecological History:   GYNECOLOGIC HISTORY:  No LMP recorded. Patient has had a hysterectomy.  Family Hx:  Family History  Problem Relation Age of Onset  . Lung cancer Mother 67  . High blood pressure Mother   . High Cholesterol Mother   . Lung cancer Father 51       2 ppd smoker  . Parkinson's disease Father   . Kidney disease Unknown     Review of Systems:  Review of Systems  Gastrointestinal: Positive for diarrhea.  Musculoskeletal: Positive for back pain and myalgias.  All other systems reviewed and are negative.     Vitals:  Blood pressure (!) 156/85, pulse 82, temperature 97.7 F (36.5 C), temperature source Oral, resp. rate 20, height 5' (1.524 m), weight 206 lb (93.4 kg), SpO2 97 %.  Physical Exam:  General :  Overweight. Well developed, 72 y.o., female in no apparent distress HEENT:  Normocephalic/atraumatic, symmetric, EOMI, eyelids normal Neck:   Supple, no masses.  Lymphatics:  No cervical/ submandibular/ supraclavicular/ infraclavicular/ inguinal adenopathy Respiratory:  Respirations unlabored, no use of accessory muscles CV:   Deferred Breast:  Deferred Musculoskeletal: No CVA tenderness, normal muscle strength. Abdomen:  Notable for stable upper abdominal hernia. Soft, non-tender and nondistended. No masses. Extremities:  No lymphedema, no erythema, non-tender. Skin:   Normal inspection Neuro/Psych:  No focal motor deficit, no abnormal mental status. Normal gait. Normal affect. Alert and oriented to person, place, and time  Genito Urinary: Vulva: Normal external female genitalia.  Bladder/urethra: Urethral meatus normal in size and location. No lesions or   masses, well supported bladder Speculum exam: Vagina: No lesion, no discharge, no  bleeding. Bimanual exam: Cervix/Uterus/Adnexa: Surgically absent  Adnexal region: No masses. Rectovaginal:  Good tone, no masses, no cul de sac nodularity, no parametrial involvement or nodularity.    Assessment/Plan: 1. Data reviewed o I have personally reviewed her scans going back as far as 2/3 2019. Compared  to the most recent scans there are no worrisome changes. o Her tumor markers are reviewed today. Those seem to be reassuring 2.  Eval for recurrent disease o We previously discussed tissue diagnosis needed before we would treat as a recurrence i. Approach could be via laparotomy, however I worry given the small size of the sigmoid mesentary lesions, these may be missed intraoperatively.  ii. We have in visits past talked about setting up surgery with a diagnostic laparoscopy  o The abdominal wall lesion, to the right of midline, has been biopsied and negative. o I continue to recommend continued close followup with repeat imaging in 3 months  i. CT scan of the abdomen and pelvis  3. Treatment options should recurrence be confirmed o We previously discussed chemo followed by PARP  o If she has carcinomatosis I support upfront chemo given the small size of the lesions and the possibility the tumor may be highly chemo-sensitive. o Surgical debulking is an option too, given her platinum sensitivity, if she were thought to have only a few sites of recurrence that were resectable 4. Hernia o We have discussed her hernia. She is bothered by this cosmetically, but in no pain and no GI symptoms of concern. o I do not think she has carcinomatosis or typical recurrent ovarian cancer so she is cleared for the hernia repair, however I would like to look laparoscopically at the time of the hernia repair if they plan to do it laparoscopically. o We will keep an eye on her followup with them for that planning 5. Genetics o We did get results back (serum and tumor); no targets for  PARP 6. Followup o RTC 3 months with CT prior  Face to face time with patient was 25 minutes. Over 50% of this time was spent on counseling and coordination of care.   Isabel Caprice, MD  06/01/2018, 4:30 PM  Cc:  Cari Caraway, MD (PCP)

## 2018-06-01 ENCOUNTER — Inpatient Hospital Stay: Payer: Medicare HMO | Admitting: Obstetrics

## 2018-06-01 ENCOUNTER — Encounter: Payer: Self-pay | Admitting: Obstetrics

## 2018-06-01 ENCOUNTER — Inpatient Hospital Stay (HOSPITAL_BASED_OUTPATIENT_CLINIC_OR_DEPARTMENT_OTHER): Payer: Medicare HMO | Admitting: Obstetrics

## 2018-06-01 VITALS — BP 156/85 | HR 82 | Temp 97.7°F | Resp 20 | Ht 60.0 in | Wt 206.0 lb

## 2018-06-01 DIAGNOSIS — Z8543 Personal history of malignant neoplasm of ovary: Secondary | ICD-10-CM | POA: Diagnosis not present

## 2018-06-01 DIAGNOSIS — Z90722 Acquired absence of ovaries, bilateral: Secondary | ICD-10-CM | POA: Diagnosis not present

## 2018-06-01 DIAGNOSIS — C5701 Malignant neoplasm of right fallopian tube: Secondary | ICD-10-CM | POA: Diagnosis not present

## 2018-06-01 DIAGNOSIS — Z90710 Acquired absence of both cervix and uterus: Secondary | ICD-10-CM | POA: Diagnosis not present

## 2018-06-01 DIAGNOSIS — C786 Secondary malignant neoplasm of retroperitoneum and peritoneum: Secondary | ICD-10-CM | POA: Diagnosis not present

## 2018-06-01 DIAGNOSIS — Z9221 Personal history of antineoplastic chemotherapy: Secondary | ICD-10-CM | POA: Diagnosis not present

## 2018-06-01 NOTE — Patient Instructions (Signed)
Call our office in early December to schedule appointment for Jan 2020. Your CT has been scheduled and we will call you with results.

## 2018-06-06 ENCOUNTER — Telehealth: Payer: Self-pay | Admitting: Oncology

## 2018-06-06 NOTE — Telephone Encounter (Signed)
Left a message for Remo Lipps with appointment to see Dr. Rosendo Gros at [facility] on 06/22/18 at 9:15 am.  Requested a return call.

## 2018-06-07 ENCOUNTER — Encounter: Payer: Self-pay | Admitting: Obstetrics

## 2018-06-09 ENCOUNTER — Encounter: Payer: Self-pay | Admitting: Oncology

## 2018-06-15 DIAGNOSIS — Z23 Encounter for immunization: Secondary | ICD-10-CM | POA: Diagnosis not present

## 2018-06-21 ENCOUNTER — Telehealth: Payer: Self-pay | Admitting: Oncology

## 2018-06-21 NOTE — Telephone Encounter (Signed)
Called Brigitta to make sure she received the mychart message with her appointment with Dr. Rosendo Gros at [facility].  She verbalized understanding and agreement.

## 2018-06-22 DIAGNOSIS — K432 Incisional hernia without obstruction or gangrene: Secondary | ICD-10-CM | POA: Diagnosis not present

## 2018-07-20 ENCOUNTER — Telehealth: Payer: Self-pay | Admitting: Oncology

## 2018-07-20 NOTE — Telephone Encounter (Signed)
Called Enda and asked if the hernia surgery has been scheduled yet.  She said Dr. Rosendo Gros wants to see her again in February and then "go from there."

## 2018-07-30 DIAGNOSIS — R1084 Generalized abdominal pain: Secondary | ICD-10-CM | POA: Diagnosis not present

## 2018-08-08 ENCOUNTER — Other Ambulatory Visit: Payer: Self-pay | Admitting: Neurology

## 2018-08-08 DIAGNOSIS — G4733 Obstructive sleep apnea (adult) (pediatric): Secondary | ICD-10-CM

## 2018-08-08 DIAGNOSIS — Z9989 Dependence on other enabling machines and devices: Secondary | ICD-10-CM

## 2018-08-08 DIAGNOSIS — F5102 Adjustment insomnia: Secondary | ICD-10-CM

## 2018-08-08 DIAGNOSIS — G4719 Other hypersomnia: Secondary | ICD-10-CM

## 2018-08-22 ENCOUNTER — Inpatient Hospital Stay: Payer: Medicare HMO | Attending: Gynecologic Oncology

## 2018-08-22 DIAGNOSIS — C5701 Malignant neoplasm of right fallopian tube: Secondary | ICD-10-CM | POA: Insufficient documentation

## 2018-08-22 DIAGNOSIS — Z9221 Personal history of antineoplastic chemotherapy: Secondary | ICD-10-CM | POA: Insufficient documentation

## 2018-08-22 DIAGNOSIS — R971 Elevated cancer antigen 125 [CA 125]: Secondary | ICD-10-CM | POA: Insufficient documentation

## 2018-08-22 DIAGNOSIS — C786 Secondary malignant neoplasm of retroperitoneum and peritoneum: Secondary | ICD-10-CM | POA: Insufficient documentation

## 2018-08-23 ENCOUNTER — Telehealth: Payer: Self-pay | Admitting: Gynecologic Oncology

## 2018-08-23 ENCOUNTER — Telehealth: Payer: Self-pay

## 2018-08-23 ENCOUNTER — Inpatient Hospital Stay: Payer: Medicare HMO

## 2018-08-23 DIAGNOSIS — R971 Elevated cancer antigen 125 [CA 125]: Secondary | ICD-10-CM | POA: Diagnosis not present

## 2018-08-23 DIAGNOSIS — C5701 Malignant neoplasm of right fallopian tube: Secondary | ICD-10-CM

## 2018-08-23 DIAGNOSIS — Z9221 Personal history of antineoplastic chemotherapy: Secondary | ICD-10-CM | POA: Diagnosis not present

## 2018-08-23 DIAGNOSIS — C786 Secondary malignant neoplasm of retroperitoneum and peritoneum: Secondary | ICD-10-CM | POA: Diagnosis not present

## 2018-08-23 LAB — BASIC METABOLIC PANEL
Anion gap: 10 (ref 5–15)
BUN: 12 mg/dL (ref 8–23)
CO2: 27 mmol/L (ref 22–32)
Calcium: 9.9 mg/dL (ref 8.9–10.3)
Chloride: 106 mmol/L (ref 98–111)
Creatinine, Ser: 0.72 mg/dL (ref 0.44–1.00)
GFR calc Af Amer: >60 mL/min (ref >60)
GFR calc non Af Amer: >60 mL/min (ref >60)
Glucose, Bld: 114 mg/dL — ABNORMAL HIGH (ref 70–99)
Potassium: 4 mmol/L (ref 3.5–5.1)
Sodium: 143 mmol/L (ref 135–145)

## 2018-08-23 NOTE — Telephone Encounter (Signed)
Incoming call from pt regarding someone called her to schedule appt.  Per Sharyn Lull, pt needs lab appt today.  Scheduled for 12:15 pm and reminded pt she has CT appt for tomorrow - arrive at 5:45 am to start contrast. No other needs per pt at this time.

## 2018-08-24 ENCOUNTER — Ambulatory Visit (HOSPITAL_COMMUNITY): Admission: RE | Admit: 2018-08-24 | Payer: Medicare HMO | Source: Ambulatory Visit

## 2018-08-24 LAB — CA 125: Cancer Antigen (CA) 125: 9.5 U/mL (ref 0.0–38.1)

## 2018-08-29 ENCOUNTER — Telehealth: Payer: Self-pay | Admitting: *Deleted

## 2018-08-29 ENCOUNTER — Ambulatory Visit (HOSPITAL_COMMUNITY): Payer: Medicare HMO

## 2018-08-29 NOTE — Telephone Encounter (Signed)
Called and spoke with the patient. Scheduled a follow up appt after the CT scan

## 2018-09-02 ENCOUNTER — Telehealth: Payer: Self-pay | Admitting: *Deleted

## 2018-09-02 NOTE — Telephone Encounter (Signed)
Open by mistake

## 2018-09-04 DIAGNOSIS — M778 Other enthesopathies, not elsewhere classified: Secondary | ICD-10-CM | POA: Diagnosis not present

## 2018-09-04 DIAGNOSIS — H02843 Edema of right eye, unspecified eyelid: Secondary | ICD-10-CM | POA: Diagnosis not present

## 2018-09-04 DIAGNOSIS — H02846 Edema of left eye, unspecified eyelid: Secondary | ICD-10-CM | POA: Diagnosis not present

## 2018-09-08 ENCOUNTER — Ambulatory Visit (HOSPITAL_COMMUNITY)
Admission: RE | Admit: 2018-09-08 | Discharge: 2018-09-08 | Disposition: A | Discharge status: Home / Self Care | Payer: Medicare HMO | Source: Ambulatory Visit | Attending: Gynecologic Oncology | Admitting: Gynecologic Oncology

## 2018-09-08 ENCOUNTER — Ambulatory Visit (HOSPITAL_COMMUNITY): Payer: Medicare HMO

## 2018-09-08 DIAGNOSIS — C5701 Malignant neoplasm of right fallopian tube: Secondary | ICD-10-CM | POA: Diagnosis not present

## 2018-09-08 DIAGNOSIS — C786 Secondary malignant neoplasm of retroperitoneum and peritoneum: Secondary | ICD-10-CM | POA: Diagnosis not present

## 2018-09-08 MED ORDER — IOHEXOL 300 MG/ML  SOLN
30.0000 mL | Freq: Once | INTRAMUSCULAR | Status: AC | PRN
  Administered 2018-09-08: 30 mL via ORAL

## 2018-09-08 MED ORDER — SODIUM CHLORIDE (PF) 0.9 % IJ SOLN
INTRAMUSCULAR | Status: AC
  Filled 2018-09-08: qty 50

## 2018-09-08 MED ORDER — IOHEXOL 300 MG/ML  SOLN
100.0000 mL | Freq: Once | INTRAMUSCULAR | Status: AC | PRN
  Administered 2018-09-08: 100 mL via INTRAVENOUS

## 2018-09-12 ENCOUNTER — Telehealth: Payer: Self-pay | Admitting: Neurology

## 2018-09-12 DIAGNOSIS — G4733 Obstructive sleep apnea (adult) (pediatric): Secondary | ICD-10-CM | POA: Diagnosis not present

## 2018-09-12 NOTE — Telephone Encounter (Signed)
PA submitted through cover my meds/ [employer]. DPT:[id number] Will wait to hear a response from [place] on determination.

## 2018-09-13 NOTE — Telephone Encounter (Signed)
PA approved through [facility] for the pt until 08/10/2019

## 2018-09-14 DIAGNOSIS — H43813 Vitreous degeneration, bilateral: Secondary | ICD-10-CM | POA: Diagnosis not present

## 2018-09-14 DIAGNOSIS — H2513 Age-related nuclear cataract, bilateral: Secondary | ICD-10-CM | POA: Diagnosis not present

## 2018-09-14 DIAGNOSIS — H04123 Dry eye syndrome of bilateral lacrimal glands: Secondary | ICD-10-CM | POA: Diagnosis not present

## 2018-09-14 DIAGNOSIS — H5111 Convergence insufficiency: Secondary | ICD-10-CM | POA: Diagnosis not present

## 2018-09-16 ENCOUNTER — Encounter: Payer: Self-pay | Admitting: Obstetrics

## 2018-09-16 ENCOUNTER — Inpatient Hospital Stay: Payer: Medicare HMO | Attending: Gynecologic Oncology | Admitting: Obstetrics

## 2018-09-16 VITALS — BP 122/76 | HR 85 | Temp 98.3°F | Resp 18 | Ht 60.0 in | Wt 196.0 lb

## 2018-09-16 DIAGNOSIS — Z90722 Acquired absence of ovaries, bilateral: Secondary | ICD-10-CM | POA: Insufficient documentation

## 2018-09-16 DIAGNOSIS — Z9221 Personal history of antineoplastic chemotherapy: Secondary | ICD-10-CM | POA: Diagnosis not present

## 2018-09-16 DIAGNOSIS — R971 Elevated cancer antigen 125 [CA 125]: Secondary | ICD-10-CM | POA: Insufficient documentation

## 2018-09-16 DIAGNOSIS — C5701 Malignant neoplasm of right fallopian tube: Secondary | ICD-10-CM | POA: Insufficient documentation

## 2018-09-16 DIAGNOSIS — Z90710 Acquired absence of both cervix and uterus: Secondary | ICD-10-CM | POA: Diagnosis not present

## 2018-09-16 DIAGNOSIS — C786 Secondary malignant neoplasm of retroperitoneum and peritoneum: Secondary | ICD-10-CM

## 2018-09-16 NOTE — Patient Instructions (Signed)
Return in 6 months for pelvic and CA125

## 2018-09-16 NOTE — Progress Notes (Signed)
[place] at [facility]    Progress Note: Established Patient Follow-up Visit  CC:  No chief complaint on file.  Oncologic Summary: 1. 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 Random CA 125 elevation January 2019  Question mesenteric nodules and anterior abdominal wall nodule 3. GeneDx Breast/Ovary Panel negative (including BRCA, MMR's, RAD51 etc)  Myriad BRACAnalysis  Negative for BRCA1/2 in tumor   HPI: Ms. Kristin Webb  is a very nice 73 y.o.  year old P2   Interval History:   No new symptoms or complaints. 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  Since her last visit we have another CA125 and another CT noted below.   Initial Presentation: She noticed increasing abdominal swelling around the third week of January 2014. Initially, the abdominal swelling was intermittent and would come and go. She related this to initiation of a calcium supplement. The abdominal swelling became more prevalent starting in February with abdominal pain reported also. In addition, she started having increasing constipation and formed stools, which was a change in her routine of 5-6 loose bowel movements a day chronically after her cholecystectomy.  On November 01, 2012, she had a CT scan of the abdomen and pelvis revealing a large midabdominal mass measuring 17 x 22.7 cm x 20 cm.  On November 08, 2012, she underwent an exploratory laparotomy, BSO, appendectomy, infracolic omentectomy, and optimal debulking. Operative findings included: 25 cm right adnexal mass with smooth surface. Surgically absent uterus. Atrophic-appearing left ovary. Normal appearing appendix. Within the omentum there were centimeter nodules  scattered throughout the omentum. The remainder of the surfaces were benign. Final pathology revealed:  1. 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 - BENIGN FALLOPIAN TUBE WITH NONSPECIFIC CHRONIC INFLAMMATION. 2. Ovary and fallopian tube, left - BENIGN OVARY; NEGATIVE FOR ATYPIA OR MALIGNANCY. - BENIGN FALLOPIAN TUBE; NEGATIVE FOR ATYPIA OR MALIGNANCY. 3. Omentum, resection for tumor - HIGH GRADE CARCINOMA, SEE COMMENT. 4. Appendix, Other than Incidental - FIBROUS OBLITERATION OF APPENDICEAL TIP. - NEGATIVE FOR MALIGNANCY.   She completed cycle 6 of 6 planned treatments of carboplatin and taxol on 03/28/13.   CA125 was noted in Jan 2019 to go from single digit baseline to the 20's. Repeat in 09/24/2017 confirmed at 27.  Imaging was recommended including a CT and a PET scan.   The CT 09/30/17 revealed : new clustered soft tissue nodularity in the left lower quadrant in the sigmoid mesentery with a dominant 1.0 cm nodule (series 2/image 73).    PET 10/11/17 revealed : Hypermetabolic nodules in the sigmoid mesentery measure up to 1.5 cm (CT image 158) with an SUV max of 9.1. No abnormal hypermetabolism in the liver, adrenal glands, spleen or pancreas. No hypermetabolic lymph nodes.  She denied any symptoms concerning for recurrence.  She did however note some back pain that radiated around to her front; mid back pain. She denies nausea vomiting and changes in bowel movement.  Since my first meeting her (10/27/17), before her trip to [facility] with her grandkids, we talked about options including biopsy of the anterior abdominal wall versus going to surgery . That was performed (11/03/17) and negative, despite having an  elevated SUV on PET. There are no corresponding cine clips or images I can review to determine the biopsy location.      --> BIOPSY 11/03/17  FOREIGN BODY GIANT CELL REACTION INVOLVING FIBROADIPOSE TISSUE AND SKELETAL MUSCLE  We obtained 2 more CA125 levels lower than the Jan/Feb 2019 (10 in April 2019 and 14 in July 2019)  Also repeat imaging with CT scan. - The impression is somewhat worrisome in its wording but I personally reviewed the images with radiology and explained to the patient overall the imaging was reassuring with the exception of one area    Measurement of disease:  It is hard to know if CA125 is a good MOD (I do not see one drawn preop) therefore exam and imaging are MOD.  Recent Labs    09/24/17 0956 11/22/17 1017 02/22/18 0928 05/25/18 1100 08/23/18 1221  CAN125 27.1 10.1 14.1 15.2 9.5    Radiology: Ct Abdomen Pelvis W Contrast  Result Date: 09/08/2018 CLINICAL DATA:  Carcinoma RIGHT fallopian tube. Omental and peritoneal metastasis. EXAM: CT ABDOMEN AND PELVIS WITH CONTRAST TECHNIQUE: Multidetector CT imaging of the abdomen and pelvis was performed using the standard protocol following bolus administration of intravenous contrast. CONTRAST:  119m OMNIPAQUE IOHEXOL 300 MG/ML SOLN, 364mOMNIPAQUE IOHEXOL 300 MG/ML SOLN COMPARISON:  None. FINDINGS: Lower chest: Lung bases are clear. Hepatobiliary: No focal hepatic lesion. Postcholecystectomy. No biliary dilatation. Pancreas: Pancreas is normal. No ductal dilatation. No pancreatic inflammation. Spleen: Normal spleen Adrenals/urinary tract: Adrenal glands normal. Kidneys, ureters and bladder normal. Stomach/Bowel: Stomach, duodenum small-bowel and cecum normal. Ascending colon normal. The transverse colon enters a large ventral hernia. The hernia mouth measures 6.4 cm mild hernia sac measures 68.2 cm. Approximately 8 cm segment transverse colon fills the hernia sac. No evidence of high-grade obstruction however contrast does not pass more distal to the hernia sac. Descending colon and rectosigmoid colon normal. Small lymph nodes in the sigmoid mesocolon decreased in  size. For example 8 mm node on image 71/2 decreased from 12 mm on prior. Small 4 mm node (image 67/2) decreased from 10 mm. No new adenopathy in the mesocolon. Vascular/Lymphatic: Abdominal aorta is normal caliber. No periportal or retroperitoneal adenopathy. No pelvic adenopathy. Reproductive: Post hysterectomy anatomy Other: No free fluid. Musculoskeletal: No aggressive osseous lesion. IMPRESSION: 1. Nodules within the sigmoid mesocolon have decreased in size compared to prior. No new peritoneal or omental nodularity. 2. No evidence local recurrence the pelvis. 3. Ventral hernia contains a segment of transverse colon. No change from prior. Electronically Signed   By: StSuzy Bouchard.D.   On: 09/08/2018 15:13    Ct Abdomen Pelvis W Contrast  Result Date: 09/08/2018 CLINICAL DATA:  Carcinoma RIGHT fallopian tube. Omental and peritoneal metastasis. EXAM: CT ABDOMEN AND PELVIS WITH CONTRAST TECHNIQUE: Multidetector CT imaging of the abdomen and pelvis was performed using the standard protocol following bolus administration of intravenous contrast. CONTRAST:  10068mMNIPAQUE IOHEXOL 300 MG/ML SOLN, 15m36mNIPAQUE IOHEXOL 300 MG/ML SOLN COMPARISON:  None. FINDINGS: Lower chest: Lung bases are clear. Hepatobiliary: No focal hepatic lesion. Postcholecystectomy. No biliary dilatation. Pancreas: Pancreas is normal. No ductal dilatation. No pancreatic inflammation. Spleen: Normal spleen Adrenals/urinary tract: Adrenal glands normal. Kidneys, ureters and bladder normal. Stomach/Bowel: Stomach, duodenum small-bowel and cecum normal. Ascending colon normal. The transverse colon enters a large ventral hernia. The hernia mouth measures 6.4 cm mild hernia sac measures 68.2 cm. Approximately 8 cm segment transverse colon fills the hernia sac. No evidence of high-grade obstruction however  contrast does not pass more distal to the hernia sac. Descending colon and rectosigmoid colon normal. Small lymph nodes in the sigmoid  mesocolon decreased in size. For example 8 mm node on image 71/2 decreased from 12 mm on prior. Small 4 mm node (image 67/2) decreased from 10 mm. No new adenopathy in the mesocolon. Vascular/Lymphatic: Abdominal aorta is normal caliber. No periportal or retroperitoneal adenopathy. No pelvic adenopathy. Reproductive: Post hysterectomy anatomy Other: No free fluid. Musculoskeletal: No aggressive osseous lesion. IMPRESSION: 1. Nodules within the sigmoid mesocolon have decreased in size compared to prior. No new peritoneal or omental nodularity. 2. No evidence local recurrence the pelvis. 3. Ventral hernia contains a segment of transverse colon. No change from prior. Electronically Signed   By: Suzy Bouchard M.D.   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  Interval development of large mid abdominal mass highly concerning for right ovarian cancer.  There is mild omental nodularity on the left, and peritoneal disease cannot be completely excluded.  There is no ascites or other evidence of metastatic disease. 2.  Mild associated renal pelvocaliectasis bilaterally without obstruction.  Nonobstructing left renal calculus and a small right renal angiomyolipoma noted incidentally.    11/08/2012 Pathology Results    1. 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 - BENIGN FALLOPIAN TUBE WITH NONSPECIFIC CHRONIC INFLAMMATION. 2. Ovary and fallopian tube, left - BENIGN OVARY; NEGATIVE FOR ATYPIA OR MALIGNANCY. - BENIGN FALLOPIAN TUBE; NEGATIVE FOR ATYPIA OR MALIGNANCY. 3. Omentum, resection for tumor - HIGH GRADE CARCINOMA, SEE COMMENT. 4.  Appendix, Other than Incidental - FIBROUS OBLITERATION OF APPENDICEAL TIP. - NEGATIVE FOR MALIGNANCY.    11/08/2012 Surgery    Surgery: Exploratory laparotomy, bilateral salpingo-oophorectomy, appendectomy, infacolic omentectomy, optimal debulking  Surgeons:  Paola A. Alycia Rossetti, MD; Lahoma Crocker, MD   Assistant: Caswell Corwin  Pathology: Bilateral fallopian tubes and ovaries to pathology. Appendix as well as omentum. 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  Operative findings: 25 cm right adnexal mass with smooth surface. Surgically absent uterus. Atrophic-appearing left ovary. Normal appearing appendix. Within the omentum there were centimeter nodules scattered throughout the omentum. The remainder of the surfaces were benign.    12/08/2012 Procedure    Impression:  Placement of a subcutaneous port device.  The catheter tip is in the lower SVC and ready to be used.     12/13/2012 - 03/28/2013 Chemotherapy    s/p 6 cycles of paclitaxel and carboplatin    12/13/2012 - 03/28/2013 Chemotherapy    The patient had 6 cycles of carboplatin and Taxol    03/24/2013 Imaging    US abdomen    04/24/2013 Imaging    CT abdomen Interval resection of the large right pelvic and lower abdominal mass lesion with apparent omentectomy.  No evidence for intraperitoneal free fluid on today's study.  No discernible peritoneal lesions.  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    09/07/2013 Genetic Testing    Patient has genetic testing done for BRCA1/2 panel Results revealed patient has no mutation(s):  07/09/2015 Imaging    CT abdomen 1. 12 mm obstructive calculus at the left ureteropelvic junction with moderate proximal hydronephrosis. 2. 2 small supraumbilical ventral hernias, one containing a short segment of the mid transverse colon and the other containing a short segment of the mid small bowel. There is no  associated evidence to suggest bowel incarceration or obstruction at this time. 3. Tiny locule of gas non dependently in the lumen of the urinary bladder. This is presumably iatrogenic related to recent catheterization for urinalysis. Alternatively, this could be seen in the setting of urinary tract infection with gas-forming organisms. Clinical correlation for history of recent catheterization is recommended. 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 5. Status post cholecystectomy. 6. Additional incidental findings, as above.     12/11/2016 Imaging    Ct abdomen 1. No evidence of metastatic ovarian cancer. 2. Recurrent subxiphoid ventral abdominal wall hernia containing transverse colon. No evidence of incarceration or obstruction. 3. Stable incidental findings in the liver and kidneys. No recurrent urinary tract calculus. 4. Progressive lower lumbar spondylosis. 5.  Aortic Atherosclerosis (ICD10-I70.0).     08/30/2017 Imaging    MRI thoracic spine 1. At T5-6 there is a small central disc protrusion contacting the ventral thoracic spinal cord. No central canal or foraminal stenosis. 2. At T9-10 there is a small right paracentral disc protrusion. 3.  No acute osseous injury of the thoracic spine. 4. No aggressive osseous lesion to suggest metastatic disease.    09/24/2017 Tumor Marker    Patient's tumor was tested for the following markers: CA-125 Results of the tumor marker test revealed 21.7    09/30/2017 Imaging    CT abdomen 1. New small clustered soft tissue nodules in the left lower quadrant in the sigmoid mesentery, largest 1.0 cm, which could represent recurrent peritoneal tumor implants. No ascites.  2. Midline high ventral abdominal wall hernia containing a portion of the transverse colon is mildly increased in size, and without bowel complication at this time. 3. Chronic findings include: Aortic Atherosclerosis (ICD10-I70.0). Diffuse hepatic steatosis.  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    10/11/2017 PET scan    1. Nodules in the sigmoid mesentery are hypermetabolic and highly worrisome for metastatic disease. 2. Attic steatosis.     11/03/2017 Procedure    Successful CT-guided rectus abdominal muscle mass core biopsy.    11/04/2017 Cancer Staging    Staging form: Fallopian Tube, AJCC 7th Edition - Clinical: FIGO Stage IIIC, calculated as Stage III (T3, N0, M0) - Signed by [name], MD on 11/04/2017     Current Meds:  Outpatient Encounter Medications as of 09/16/2018  Medication Sig  . Armodafinil (NUVIGIL) 250 MG tablet Take 1 tablet (250 mg total) by mouth daily.  . Carboxymethylcellulose Sodium (EYE DROPS OP) Apply 2 drops to eye daily as needed (dry eye).  . colestipol (COLESTID) 1 g tablet Take 0.5 g by mouth daily. Pt taking 1/2 to 1 tablet daily  . EPINEPHrine (EPIPEN 2-PAK) 0.3 mg/0.3 mL DEVI Inject 0.3 mg into the muscle once as needed (For bee stings.). Reported on 01/20/2016  . eszopiclone (LUNESTA) 2 MG TABS tablet TAKE ONE TABLET BY MOUTH EVERY EVENING AT BEDTIME AS NEEDED FOR SLEEP (TAKE IMMEDIATELY BEFORE BEDTIME)  . FLUoxetine (PROZAC) 10 MG capsule Take 10 mg by mouth daily. Takes with a 70m capsule for her total dose of 333m  . Marland KitchenLUoxetine (PROZAC) 20 MG capsule Take 20 mg  by mouth daily. Takes with a 26m capsule for her total dose of 310m  . Marland Kitchenbuprofen (ADVIL,MOTRIN) 200 MG tablet Take 400-600 mg by mouth every 6 (six) hours as needed for mild pain or moderate pain.  . Marland KitchenELATONIN PO Take by mouth.  . metFORMIN (GLUCOPHAGE-XR) 500 MG 24 hr tablet Take 1 tablet by mouth daily.  . Multiple Vitamin (MULTIVITAMIN WITH MINERALS) TABS Take 1 tablet by mouth daily.  . naproxen sodium (ALEVE) 220 MG tablet Take 220 mg by mouth.  . rosuvastatin (CRESTOR) 10 MG tablet Take 10 mg by mouth at bedtime.   . triamterene-hydrochlorothiazide (MAXZIDE-25) 37.5-25 MG tablet  Take 1 tablet by mouth daily.   Facility-Administered Encounter Medications as of 09/16/2018  Medication  . sodium chloride flush (NS) 0.9 % injection 10 mL    Allergy:  Allergies  Allergen Reactions  . Ambien [Zolpidem Tartrate] Other (See Comments)    Sleep walking  . Bee Venom Hives    Difficulty breathing, carries an EPI-pen  . Cortisone Nausea And Vomiting, Swelling and Other (See Comments)    INJECTED CORTISONE ONLY, "sick to my stomach, throwing up, hand swelling, and pain."    Social Hx:   Social History   Socioeconomic History  . Marital status: Widowed    Spouse name: BeIlona Sorrel. Number of children: 2  . Years of education: Master's  . Highest education level: Not on file  Occupational History  . Not on file  Social Needs  . Financial resource strain: Not on file  . Food insecurity:    Worry: Not on file    Inability: Not on file  . Transportation needs:    Medical: Not on file    Non-medical: Not on file  Tobacco Use  . Smoking status: Never Smoker  . Smokeless tobacco: Never Used  Substance and Sexual Activity  . Alcohol use: No  . Drug use: No  . Sexual activity: Not on file  Lifestyle  . Physical activity:    Days per week: Not on file    Minutes per session: Not on file  . Stress: Not on file  Relationships  . Social connections:    Talks on phone: Not on file    Gets together: Not on file    Attends religious service: Not on file    Active member of club or organization: Not on file    Attends meetings of clubs or organizations: Not on file    Relationship status: Not on file  . Intimate partner violence:    Fear of current or ex partner: Not on file    Emotionally abused: Not on file    Physically abused: Not on file    Forced sexual activity: Not on file  Other Topics Concern  . Not on file  Social History Narrative   Patient is married (BIlona Sorreland lives at home with her husband.   Patient has 2 children by birth and 13 children all  together.   Patient has a MaOceanographer  Patient is right-handed.   Patient drinks very little caffeine.          Past Surgical Hx:  Past Surgical History:  Procedure Laterality Date  . ABDOMINAL HYSTERECTOMY     early 1938s. APPENDECTOMY     WITH DEBULKING/BSO  . CHOLECYSTECTOMY     early 1985s. CYSTOSCOPY W/ URETERAL STENT PLACEMENT Left 07/09/2015   DUE TO NEPHROLITHIASIS Procedure: CYSTOSCOPY WITH LEFT RETROGRADE PYELOGRAM/ LEFT URETERAL STENT  PLACEMENT;  Surgeon: Ardis Hughs, MD;  Location: [facility];  Service: Urology;  Laterality: Left;  . INCISIONAL HERNIA REPAIR N/A 12/25/2015   WITH MESH Procedure:  INCISIONAL HERNIA REPAIR;  Surgeon: Ralene Ok, MD;  Location: [facility];  Service: General;  Laterality: N/A;  . INSERTION OF MESH N/A 12/25/2015   Procedure: INSERTION OF MESH;  Surgeon: Ralene Ok, MD;  Location: [facility];  Service: General;  Laterality: N/A;  . LAPAROTOMY Bilateral 11/08/2012   Procedure: EXPLORATORY LAPAROTOMY BILATERAL SALPINGO OOPHORECTOMY TUMOR DEBULKING ;  Surgeon: Imagene Gurney A. Alycia Rossetti, MD;  Location: [facility];  Service: Gynecology;  Laterality: Bilateral;  APPENDECTOMY / OMENTECTOMY  . LAPAROTOMY N/A 12/25/2015   Procedure: EXPLORATORY LAPAROTOMY;  Surgeon: Ralene Ok, MD;  Location: [facility];  Service: General;  Laterality: N/A;  . LYSIS OF ADHESION N/A 12/25/2015   Procedure: LYSIS OF ADHESION;  Surgeon: Ralene Ok, MD;  Location: [facility];  Service: General;  Laterality: N/A;  . PARTIAL HYSTERECTOMY  2006  . TRIGGER FINGER RELEASE      Past Medical Hx:  Past Medical History:  Diagnosis Date  . Burning mouth syndrome   . Chronic fatigue   . Constipation   . Diabetes ([place]) 05/09/2018  . Diarrhea    in the past after gallbladder removal  . Fibromyalgia   . GERD (gastroesophageal reflux disease)   . History of kidney stones 07/2015  . Hyperlipidemia   . Hypertension    borderline not on meds   . Insomnia secondary to depression with  anxiety   . Lumbar disc disease   . Ovarian cancer ([place]) 2014  . Pelvic mass in female   . Pneumonia    hx of pneumonia as an infant  . PONV (postoperative nausea and vomiting)   . Shortness of breath    with exertion   . Sleep apnea    ;neurologist informed pt she no longer needed her CPAP machine  . Yeast infection     Past Gynecological History:   GYNECOLOGIC HISTORY:  No LMP recorded. Patient has had a hysterectomy.  Family Hx:  Family History  Problem Relation Age of Onset  . Lung cancer Mother 37  . High blood pressure Mother   . High Cholesterol Mother   . Lung cancer Father 16       2 ppd smoker  . Parkinson's disease Father   . Kidney disease Other     Review of Systems:  Review of Systems  Gastrointestinal: Positive for diarrhea.  Musculoskeletal: Positive for arthralgias.  All other systems reviewed and are negative.     Vitals:  Blood pressure 122/76, pulse 85, temperature 98.3 F (36.8 C), temperature source Oral, resp. rate 18, height 5' (1.524 m), weight 196 lb (88.9 kg), SpO2 100 %.  Physical Exam:  General :  Overweight. Well developed, 73 y.o., female in no apparent distress HEENT:  Normocephalic/atraumatic, symmetric, EOMI, eyelids normal Neck:   Supple, no masses.  Lymphatics:  No cervical/ submandibular/ supraclavicular/ infraclavicular/ inguinal adenopathy Respiratory:  Respirations unlabored, no use of accessory muscles CV:   Deferred Breast:  Deferred Musculoskeletal: No CVA tenderness, normal muscle strength. Abdomen:  Notable for stable upper abdominal hernia. Soft, non-tender and nondistended. No masses. Extremities:  No lymphedema, no erythema, non-tender. Skin:   Normal inspection Neuro/Psych:  No focal motor deficit, no abnormal mental status. Normal gait. Normal affect. Alert and oriented to person, place, and time  Genito Urinary: Vulva: Normal external female genitalia.  Bladder/urethra: Urethral meatus  normal in size and  location. No lesions or   masses, well supported bladder Speculum exam: Vagina: No lesion, no discharge, no bleeding. Bimanual exam: Cervix/Uterus/Adnexa: Surgically absent  Adnexal region: No masses. Rectovaginal:  Good tone, no masses, no cul de sac nodularity, no parametrial involvement or nodularity.    Assessment/Plan: 1. Data reviewed o I personally reviewed her scan from Jan 2020 and we compared together to April 2019. One area appears stable, the other smaller. Radiology compared to priors and feels both areas smaller. o Her tumor markers are reviewed today. Those seem to be reassuring 2.  Eval for recurrent disease o We previously discussed tissue diagnosis needed before we would treat as a recurrence i. Approach could be via laparotomy, however I worry given the small size of the sigmoid mesentary lesions, these may be missed intraoperatively.  ii. We have in visits past talked about setting up surgery with a diagnostic laparoscopy  o The abdominal wall lesion, to the right of midline, has been biopsied and negative. 3. Hernia o We have discussed her hernia. She is bothered by this cosmetically, but in no pain and no GI symptoms of concern. o At this point she is cleared for surgery 4. Genetics o We did get results back (serum and tumor); no targets for PARP 5. Followup o RTC 55month for pelvic / CA125 o Defer any additional scanning to provider at 61monthollowup o I reviewed her images in detail (prior to this January 2020 scan) with Dr. HeJacqulynn Cadetnd if scans are done going forward he was fine having us[place]oreaequest him specifically to review the images.  Face to face time with patient was 15 minutes. Over 50% of this time was spent on counseling and coordination of care.   ShIsabel CapriceMD  09/16/2018, 10:08 AM  Cc:  WeCari CarawayMD (PCP)

## 2018-10-13 ENCOUNTER — Ambulatory Visit: Payer: Self-pay | Admitting: General Surgery

## 2018-10-13 DIAGNOSIS — K432 Incisional hernia without obstruction or gangrene: Secondary | ICD-10-CM | POA: Diagnosis not present

## 2018-10-13 NOTE — H&P (View-Only) (Signed)
History of Present Illness Ralene Ok MD; 10/13/2018 10:19 AM) The patient is a 73 year old female who presents with an incisional hernia. Patient is a 73 year old female who comes back in secondary to recurrent hernia. Patient has lost approximately 10 pounds since her last clinic visit. She currently weighs 194. Patient does state that hernia appears to be somewhat larger. She states that she has not minimized her exercises due to this.  She's had no signs or symptoms of incarceration or strangulation. --------------------------------- Chief Complaint: Recurrent incisional hernia.  Patient is a 74 year old female comes back in today after having previous open incisional hernia repair in May 2017. Patient underwent TAR repair with large 30 x 30 cm piece of mesh. Patient states that after her last clinic visit soon thereafter she noticed a small bulge in the epigastric region. She states that over time this has slowly gotten larger. She states she has no pain in that area. He  Patient recently states she was concerned about recurrence of ovarian cancer. This is since been ruled out. Patient did have CT scan which I did review personally which did reveal an epigastric incisional hernia. There appears to be colon within the hernia sac. Patient had no other symptoms at this time due to the hernia.  Of note patient does state that she is trying to lose weight after the concern of recurrence of her cancer. She is recently lost 5 pounds in approximately 6 weeks.    Allergies Mammie Lorenzo, LPN; 0/01/3015 01:09 AM) Cortisone *CORTICOSTEROIDS*  Ambien *HYPNOTICS/SEDATIVES/SLEEP DISORDER AGENTS*   Medication History Mammie Lorenzo, LPN; 10/10/3555 32:20 AM) Colestipol HCl (1GM Tablet, Oral) Active. Famotidine (20MG  Tablet, Oral) Active. FLUoxetine HCl (10MG  Capsule, Oral) Active. Rosuvastatin Calcium (20MG  Tablet, Oral) Active. Triamterene-HCTZ (37.5-25MG  Tablet, Oral)  Active. Triamcinolone Acetonide (0.5% Cream, External) Active. Cholestyramine (4GM/DOSE Powder, Oral) Active. Oxycodone-Acetaminophen (5-325MG  Tablet, Oral as needed) Active. Eszopiclone (2MG  Tablet, Oral) Active. Armodafinil (250MG  Tablet, Oral) Active. FLUoxetine HCl (20MG  Capsule, Oral) Active. Triamcinolone Acetonide (0.025% Cream, External as needed) Active. metFORMIN HCl ER (500MG  Tablet ER 24HR, Oral) Active. traMADol HCl (50MG  Tablet, Oral) Active. Medications Reconciled    Review of Systems Ralene Ok, MD; 10/13/2018 10:20 AM) All other systems negative  Vitals Claiborne Billings Dockery LPN; 09/14/4268 62:37 AM) 10/13/2018 10:08 AM Weight: 194.4 lb Height: 60in Body Surface Area: 1.84 m Body Mass Index: 37.97 kg/m  Temp.: 98.8F(Temporal)  Pulse: 95 (Regular)  BP: 126/80 (Sitting, Right Arm, Standard)       Physical Exam Ralene Ok, MD; 10/13/2018 10:20 AM) The physical exam findings are as follows: Note: Constitutional: No acute distress, conversant, appears stated age  Eyes: Anicteric sclerae, moist conjunctiva, no lid lag  Neck: No thyromegaly, trachea midline, no cervical lymphadenopathy  Lungs: Clear to auscultation biilaterally, normal respiratory effot  Cardiovascular: regular rate & rhythm, no murmurs, no peripheal edema, pedal pulses 2+  GI: Soft, no masses or hepatosplenomegaly, non-tender to palpation  MSK: Normal gait, no clubbing cyanosis, edema  Skin: No rashes, palpation reveals normal skin turgor  Psychiatric: Appropriate judgment and insight, oriented to person, place, and time  Abdomen Inspection Hernias - Incisional hernia - Reducible(In the epigastrium, reducible, widemouth).    Assessment & Plan Ralene Ok MD; 10/13/2018 10:20 AM) INCISIONAL HERNIA, WITHOUT OBSTRUCTION OR GANGRENE (K43.2) Impression: 73 year old female with a recurrent incisional hernia. This appears to be in the epigastric  region.  We will proceed to the operating for laparoscopic incisional hernia repair with mesh All risks and benefits were discussed  with the patient to generally include, but not limited to: infection, bleeding, damage to surrounding structures, acute and chronic nerve pain, and recurrence. Alternatives were offered and described. All questions were answered and the patient voiced understanding of the procedure and wishes to proceed at this point with hernia repair.

## 2018-10-13 NOTE — H&P (Signed)
History of Present Illness Ralene Ok MD; 10/13/2018 10:19 AM) The patient is a 73 year old female who presents with an incisional hernia. Patient is a 73 year old female who comes back in secondary to recurrent hernia. Patient has lost approximately 10 pounds since her last clinic visit. She currently weighs 194. Patient does state that hernia appears to be somewhat larger. She states that she has not minimized her exercises due to this.  She's had no signs or symptoms of incarceration or strangulation. --------------------------------- Chief Complaint: Recurrent incisional hernia.  Patient is a 73 year old female comes back in today after having previous open incisional hernia repair in May 2017. Patient underwent TAR repair with large 30 x 30 cm piece of mesh. Patient states that after her last clinic visit soon thereafter she noticed a small bulge in the epigastric region. She states that over time this has slowly gotten larger. She states she has no pain in that area. He  Patient recently states she was concerned about recurrence of ovarian cancer. This is since been ruled out. Patient did have CT scan which I did review personally which did reveal an epigastric incisional hernia. There appears to be colon within the hernia sac. Patient had no other symptoms at this time due to the hernia.  Of note patient does state that she is trying to lose weight after the concern of recurrence of her cancer. She is recently lost 5 pounds in approximately 6 weeks.    Allergies Mammie Lorenzo, LPN; 12/09/7614 07:37 AM) Cortisone *CORTICOSTEROIDS*  Ambien *HYPNOTICS/SEDATIVES/SLEEP DISORDER AGENTS*   Medication History Mammie Lorenzo, LPN; 1/0/6269 48:54 AM) Colestipol HCl (1GM Tablet, Oral) Active. Famotidine (20MG  Tablet, Oral) Active. FLUoxetine HCl (10MG  Capsule, Oral) Active. Rosuvastatin Calcium (20MG  Tablet, Oral) Active. Triamterene-HCTZ (37.5-25MG  Tablet, Oral)  Active. Triamcinolone Acetonide (0.5% Cream, External) Active. Cholestyramine (4GM/DOSE Powder, Oral) Active. Oxycodone-Acetaminophen (5-325MG  Tablet, Oral as needed) Active. Eszopiclone (2MG  Tablet, Oral) Active. Armodafinil (250MG  Tablet, Oral) Active. FLUoxetine HCl (20MG  Capsule, Oral) Active. Triamcinolone Acetonide (0.025% Cream, External as needed) Active. metFORMIN HCl ER (500MG  Tablet ER 24HR, Oral) Active. traMADol HCl (50MG  Tablet, Oral) Active. Medications Reconciled    Review of Systems Ralene Ok, MD; 10/13/2018 10:20 AM) All other systems negative  Vitals Claiborne Billings Dockery LPN; 01/09/7034 00:93 AM) 10/13/2018 10:08 AM Weight: 194.4 lb Height: 60in Body Surface Area: 1.84 m Body Mass Index: 37.97 kg/m  Temp.: 98.67F(Temporal)  Pulse: 95 (Regular)  BP: 126/80 (Sitting, Right Arm, Standard)       Physical Exam Ralene Ok, MD; 10/13/2018 10:20 AM) The physical exam findings are as follows: Note: Constitutional: No acute distress, conversant, appears stated age  Eyes: Anicteric sclerae, moist conjunctiva, no lid lag  Neck: No thyromegaly, trachea midline, no cervical lymphadenopathy  Lungs: Clear to auscultation biilaterally, normal respiratory effot  Cardiovascular: regular rate & rhythm, no murmurs, no peripheal edema, pedal pulses 2+  GI: Soft, no masses or hepatosplenomegaly, non-tender to palpation  MSK: Normal gait, no clubbing cyanosis, edema  Skin: No rashes, palpation reveals normal skin turgor  Psychiatric: Appropriate judgment and insight, oriented to person, place, and time  Abdomen Inspection Hernias - Incisional hernia - Reducible(In the epigastrium, reducible, widemouth).    Assessment & Plan Ralene Ok MD; 10/13/2018 10:20 AM) INCISIONAL HERNIA, WITHOUT OBSTRUCTION OR GANGRENE (K43.2) Impression: 73 year old female with a recurrent incisional hernia. This appears to be in the epigastric  region.  We will proceed to the operating for laparoscopic incisional hernia repair with mesh All risks and benefits were discussed  with the patient to generally include, but not limited to: infection, bleeding, damage to surrounding structures, acute and chronic nerve pain, and recurrence. Alternatives were offered and described. All questions were answered and the patient voiced understanding of the procedure and wishes to proceed at this point with hernia repair.

## 2018-10-14 NOTE — Pre-Procedure Instructions (Signed)
Kristin Webb  10/14/2018      [id number] - [place], [place]. AT [address] [place] [address] Phone: [phone] Fax: [place] [address], [place] [address] Phone: [phone] Fax: [phone]    Your procedure is scheduled on March 13th.  Report to [facility] Entrance "A" Admitting at 10:15 A.M.  Call this number if you have problems the morning of surgery:  [phone]   Remember:  Do not eat or drink after midnight.     Take these medicines the morning of surgery with A SIP OF WATER   Eye Drops - if needed  Colestipol (Colestid)  Fluoxetine (Prozac)  Rosuvastatin (Crestor)  7 days prior to surgery STOP taking any Aspirin (unless otherwise instructed by your surgeon), Aleve, Naproxen, Ibuprofen, Motrin, Advil, Goody's, BC's, all herbal medications, fish oil, and all vitamins.     Do not wear jewelry, make-up or nail polish.  Do not wear lotions, powders, or perfumes, or deodorant.  Do not shave 48 hours prior to surgery.    Do not bring valuables to the hospital.  [facility] is not responsible for any belongings or valuables.   [facility]- Preparing For Surgery  Before surgery, you can play an important role. Because skin is not sterile, your skin needs to be as free of germs as possible. You can reduce the number of germs on your skin by washing with CHG (chlorahexidine gluconate) Soap before surgery.  CHG is an antiseptic cleaner which kills germs and bonds with the skin to continue killing germs even after washing.    Oral Hygiene is also important to reduce your risk of infection.  Remember - BRUSH YOUR TEETH THE MORNING OF SURGERY WITH YOUR REGULAR TOOTHPASTE  Please do not use if you have an allergy to CHG or antibacterial soaps. If your skin becomes reddened/irritated stop using the CHG.   Do not shave (including legs and underarms) for at least 48 hours prior to first CHG shower. It is OK to shave your face.  Please follow these instructions carefully.   1. Shower the NIGHT BEFORE SURGERY and the MORNING OF SURGERY with CHG.   2. If you chose to wash your hair, wash your hair first as usual with your normal shampoo.  3. After you shampoo, rinse your hair and body thoroughly to remove the shampoo.  4. Use CHG as you would any other liquid soap. You can apply CHG directly to the skin and wash gently with a scrungie or a clean washcloth.   5. Apply the CHG Soap to your body ONLY FROM THE NECK DOWN.  Do not use on open wounds or open sores. Avoid contact with your eyes, ears, mouth and genitals (private parts). Wash Face and genitals (private parts)  with your normal soap.  6. Wash thoroughly, paying special attention to the area where your surgery will be performed.  7. Thoroughly rinse your body with warm water from the neck down.  8. DO NOT shower/wash with your normal soap after using and rinsing off the CHG Soap.  9. Pat yourself dry with a CLEAN TOWEL.  10. Wear CLEAN PAJAMAS to bed the night before surgery, wear comfortable clothes the morning of surgery  11. Place CLEAN SHEETS on your bed the night of your first shower and DO NOT SLEEP WITH PETS.  Day of Surgery:  Do not apply any deodorants/lotions.  Please wear clean clothes to the hospital/surgery center.   Remember to brush your teeth WITH YOUR REGULAR TOOTHPASTE.   Contacts, dentures or bridgework may not be worn into surgery.  Leave your suitcase in the car.  After surgery it may be brought to your room.  For patients admitted to the hospital, discharge time will be determined by your treatment team.  Patients discharged the day of surgery will not be allowed to drive home.    Please read over the following fact sheets that you were given. Coughing and Deep Breathing and Surgical Site Infection  Prevention

## 2018-10-17 ENCOUNTER — Encounter (HOSPITAL_COMMUNITY)
Admission: RE | Admit: 2018-10-17 | Discharge: 2018-10-17 | Disposition: A | Discharge status: Home / Self Care | Payer: Medicare HMO | Source: Ambulatory Visit | Attending: General Surgery | Admitting: General Surgery

## 2018-10-17 ENCOUNTER — Other Ambulatory Visit: Payer: Self-pay

## 2018-10-17 ENCOUNTER — Encounter (HOSPITAL_COMMUNITY): Payer: Self-pay

## 2018-10-17 DIAGNOSIS — Z01812 Encounter for preprocedural laboratory examination: Secondary | ICD-10-CM | POA: Diagnosis not present

## 2018-10-17 LAB — BASIC METABOLIC PANEL
Anion gap: 10 (ref 5–15)
BUN: 15 mg/dL (ref 8–23)
CO2: 24 mmol/L (ref 22–32)
Calcium: 9.5 mg/dL (ref 8.9–10.3)
Chloride: 103 mmol/L (ref 98–111)
Creatinine, Ser: 0.59 mg/dL (ref 0.44–1.00)
GFR calc Af Amer: >60 mL/min (ref >60)
GFR calc non Af Amer: >60 mL/min (ref >60)
Glucose, Bld: 104 mg/dL — ABNORMAL HIGH (ref 70–99)
Potassium: 3.6 mmol/L (ref 3.5–5.1)
Sodium: 137 mmol/L (ref 135–145)

## 2018-10-17 LAB — CBC
HCT: 47.4 % — ABNORMAL HIGH (ref 36.0–46.0)
Hemoglobin: 14.8 g/dL (ref 12.0–15.0)
MCH: 28.6 pg (ref 26.0–34.0)
MCHC: 31.2 g/dL (ref 30.0–36.0)
MCV: 91.5 fL (ref 80.0–100.0)
Platelets: 165 10*3/uL (ref 150–400)
RBC: 5.18 MIL/uL — ABNORMAL HIGH (ref 3.87–5.11)
RDW: 13.2 % (ref 11.5–15.5)
WBC: 5.6 10*3/uL (ref 4.0–10.5)
nRBC: 0 % (ref 0.0–0.2)

## 2018-10-17 LAB — HEMOGLOBIN A1C
Hgb A1c MFr Bld: 6 % — ABNORMAL HIGH (ref 4.8–5.6)
Mean Plasma Glucose: 125.5 mg/dL

## 2018-10-17 LAB — GLUCOSE, CAPILLARY: Glucose-Capillary: 104 mg/dL — ABNORMAL HIGH (ref 70–99)

## 2018-10-17 NOTE — Progress Notes (Addendum)
PCP - Cari Caraway Cardiologist - pt denies  Chest x-ray - pt denies past year, no recent respiratory infections/complications EKG - 02/13/85 in EPIC  Stress Test - pt denies ECHO - 2016  Cardiac Cath - pt denies  Sleep Study - Yes-per history CPAP - Yes  Fasting Blood Sugar - 100s Checks Blood Sugar once a week at church  Blood Thinner Instructions: n/a Aspirin Instructions: n/a  Anesthesia review: No  Patient denies shortness of breath, fever, cough and chest pain at PAT appointment  Patient verbalized understanding of instructions that were given to them at the PAT appointment. Patient was also instructed that they will need to review over the PAT instructions again at home before surgery.

## 2018-10-17 NOTE — Progress Notes (Signed)
Kristin Webb            10/14/2018                          [id number] - [place], [place] - [place]. AT [address]. [place] [address] Phone: [phone] Fax: [place] [address] [place] [address] Phone: [phone] Fax: [phone]              Your procedure is scheduled on March 13th.            Report to [facility] Entrance "A" Admitting at 10:15 AM.            Call this number if you have problems the morning of surgery:            [phone]             Remember:            Do not eat or drink after midnight.                                   Take these medicines the morning of surgery with A SIP OF WATER             Eye Drops - if needed            Colestipol (Colestid)            Fluoxetine (Prozac)   7 days prior to surgery STOP taking any Aspirin (unless otherwise instructed by your surgeon), Aleve, Naproxen, Ibuprofen, Motrin, Advil, Goody's, BC's, all herbal medications, fish oil, and all vitamins.  WHAT DO I DO ABOUT MY DIABETES MEDICATION?   Marland Kitchen Do not take oral diabetes medicines (pills) the morning of surgery-metformin (glucophage).   Reviewed and Endorsed by [facility] Patient Education Committee, August 2015          How to Manage Your Diabetes Before and After Surgery  Why is it important to control my blood sugar before and after surgery? . Improving blood sugar levels before and after surgery helps healing and can limit problems. . A way of improving blood sugar control is eating a healthy diet by: o  Eating less sugar and carbohydrates o  Increasing activity/exercise o  Talking with your doctor about reaching your blood sugar goals . High blood sugars (greater than 180 mg/dL) can raise your risk of infections and slow your recovery, so you will need to focus on controlling your  diabetes during the weeks before surgery. . Make sure that the doctor who takes care of your diabetes knows about your planned surgery including the date and location.  How do I manage my blood sugar before surgery? . Check your blood sugar at least 4 times a day, starting 2 days before surgery, to make sure that the level is not too high or low. o Check your blood sugar the morning of your surgery when you wake up and every 2 hours until you get to the Short Stay unit. . If your blood sugar is less than 70 mg/dL, you will need to treat for low blood sugar: o Do not take insulin. o Treat a low blood sugar (less than 70 mg/dL) with  cup of clear juice (cranberry or  apple), 4 glucose tablets, OR glucose gel. Recheck blood sugar in 15 minutes after treatment (to make sure it is greater than 70 mg/dL). If your blood sugar is not greater than 70 mg/dL on recheck, call [phone] o  for further instructions. . Report your blood sugar to the short stay nurse when you get to Short Stay.  . If you are admitted to the hospital after surgery: o Your blood sugar will be checked by the staff and you will probably be given insulin after surgery (instead of oral diabetes medicines) to make sure you have good blood sugar levels. o The goal for blood sugar control after surgery is 80-180 mg/dL               [facility]- Preparing For Surgery  Before surgery, you can play an important role. Because skin is not sterile, your skin needs to be as free of germs as possible. You can reduce the number of germs on your skin by washing with CHG (chlorahexidine gluconate) Soap before surgery.  CHG is an antiseptic cleaner which kills germs and bonds with the skin to continue killing germs even after washing.    Oral Hygiene is also important to reduce your risk of infection.  Remember - BRUSH YOUR TEETH THE MORNING OF SURGERY WITH YOUR REGULAR TOOTHPASTE  Please do not use if you have an allergy to CHG or  antibacterial soaps. If your skin becomes reddened/irritated stop using the CHG.  Do not shave (including legs and underarms) for at least 48 hours prior to first CHG shower. It is OK to shave your face.  Please follow these instructions carefully.                                                                                                                     1. Shower the NIGHT BEFORE SURGERY and the MORNING OF SURGERY with CHG.   2. If you chose to wash your hair, wash your hair first as usual with your normal shampoo.  3. After you shampoo, rinse your hair and body thoroughly to remove the shampoo.  4. Use CHG as you would any other liquid soap. You can apply CHG directly to the skin and wash gently with a scrungie or a clean washcloth.   5. Apply the CHG Soap to your body ONLY FROM THE NECK DOWN.  Do not use on open wounds or open sores. Avoid contact with your eyes, ears, mouth and genitals (private parts). Wash Face and genitals (private parts)  with your normal soap.  6. Wash thoroughly, paying special attention to the area where your surgery will be performed.  7. Thoroughly rinse your body with warm water from the neck down.  8. DO NOT shower/wash with your normal soap after using and rinsing off the CHG Soap.  9. Pat yourself dry with a CLEAN TOWEL.  10. Wear CLEAN PAJAMAS to bed the night before surgery, wear comfortable clothes the morning of surgery  11. Place CLEAN  SHEETS on your bed the night of your first shower and DO NOT SLEEP WITH PETS.   Day of Surgery:  Do not apply any deodorants/lotions.  Please wear clean clothes to the hospital/surgery center.   Remember to brush your teeth WITH YOUR REGULAR TOOTHPASTE.            Do not wear jewelry, make-up or nail polish.            Do not wear lotions, powders, or perfumes, or deodorant.            Do not shave 48 hours prior to surgery.              Do not bring valuables to the hospital.             [facility] is not responsible for any belongings or valuables. Contacts, dentures or bridgework may not be worn into surgery.  Leave your suitcase in the car.  After surgery it may be brought to your room.  For patients admitted to the hospital, discharge time will be determined by your treatment team.  Patients discharged the day of surgery will not be allowed to drive home.    Please read over the following fact sheets that you were given. Coughing and Deep Breathing and Surgical Site Infection Prevention

## 2018-10-21 ENCOUNTER — Encounter (HOSPITAL_COMMUNITY): Payer: Self-pay | Admitting: Certified Registered Nurse Anesthetist

## 2018-10-21 ENCOUNTER — Ambulatory Visit (HOSPITAL_COMMUNITY): Payer: Medicare HMO | Admitting: Physician Assistant

## 2018-10-21 ENCOUNTER — Ambulatory Visit (HOSPITAL_COMMUNITY)
Admission: RE | Admit: 2018-10-21 | Discharge: 2018-10-21 | Disposition: A | Discharge status: Home / Self Care | Payer: Medicare HMO | Attending: General Surgery | Admitting: General Surgery

## 2018-10-21 ENCOUNTER — Encounter (HOSPITAL_COMMUNITY)
Admission: RE | Disposition: A | Discharge status: Home / Self Care | Payer: Self-pay | Source: Home / Self Care | Attending: General Surgery

## 2018-10-21 ENCOUNTER — Ambulatory Visit (HOSPITAL_COMMUNITY): Payer: Medicare HMO | Admitting: Certified Registered Nurse Anesthetist

## 2018-10-21 DIAGNOSIS — Z7984 Long term (current) use of oral hypoglycemic drugs: Secondary | ICD-10-CM | POA: Insufficient documentation

## 2018-10-21 DIAGNOSIS — E119 Type 2 diabetes mellitus without complications: Secondary | ICD-10-CM | POA: Diagnosis not present

## 2018-10-21 DIAGNOSIS — Z79899 Other long term (current) drug therapy: Secondary | ICD-10-CM | POA: Insufficient documentation

## 2018-10-21 DIAGNOSIS — Z8543 Personal history of malignant neoplasm of ovary: Secondary | ICD-10-CM | POA: Insufficient documentation

## 2018-10-21 DIAGNOSIS — M797 Fibromyalgia: Secondary | ICD-10-CM | POA: Diagnosis not present

## 2018-10-21 DIAGNOSIS — K432 Incisional hernia without obstruction or gangrene: Secondary | ICD-10-CM | POA: Diagnosis not present

## 2018-10-21 DIAGNOSIS — I10 Essential (primary) hypertension: Secondary | ICD-10-CM | POA: Diagnosis not present

## 2018-10-21 HISTORY — PX: INCISIONAL HERNIA REPAIR: SHX193

## 2018-10-21 LAB — GLUCOSE, CAPILLARY
Glucose-Capillary: 110 mg/dL — ABNORMAL HIGH (ref 70–99)
Glucose-Capillary: 124 mg/dL — ABNORMAL HIGH (ref 70–99)

## 2018-10-21 SURGERY — REPAIR, HERNIA, INCISIONAL, LAPAROSCOPIC
Anesthesia: General | Site: Abdomen

## 2018-10-21 MED ORDER — ROCURONIUM BROMIDE 50 MG/5ML IV SOSY
PREFILLED_SYRINGE | INTRAVENOUS | Status: DC | PRN
  Administered 2018-10-21: 10 mg via INTRAVENOUS
  Administered 2018-10-21: 50 mg via INTRAVENOUS

## 2018-10-21 MED ORDER — GLYCOPYRROLATE PF 0.2 MG/ML IJ SOSY
PREFILLED_SYRINGE | INTRAMUSCULAR | Status: DC | PRN
  Administered 2018-10-21: .2 mg via INTRAVENOUS

## 2018-10-21 MED ORDER — CHLORHEXIDINE GLUCONATE CLOTH 2 % EX PADS: 6.0000 | MEDICATED_PAD | Freq: Once | CUTANEOUS | Status: DC

## 2018-10-21 MED ORDER — SCOPOLAMINE 1 MG/3DAYS TD PT72
1.0000 | MEDICATED_PATCH | TRANSDERMAL | Status: DC
  Administered 2018-10-21: 1.5 mg via TRANSDERMAL
  Filled 2018-10-21: qty 1

## 2018-10-21 MED ORDER — TRAMADOL HCL 50 MG PO TABS: 50.0000 mg | ORAL_TABLET | Freq: Four times a day (QID) | ORAL | 0 refills | Status: DC | PRN

## 2018-10-21 MED ORDER — LACTATED RINGERS IV SOLN
INTRAVENOUS | Status: DC | PRN
  Administered 2018-10-21: 12:00:00 via INTRAVENOUS

## 2018-10-21 MED ORDER — GABAPENTIN 300 MG PO CAPS
ORAL_CAPSULE | ORAL | Status: AC
  Filled 2018-10-21: qty 1

## 2018-10-21 MED ORDER — BUPIVACAINE HCL (PF) 0.25 % IJ SOLN
INTRAMUSCULAR | Status: AC
  Filled 2018-10-21: qty 30

## 2018-10-21 MED ORDER — TRAMADOL HCL 50 MG PO TABS
ORAL_TABLET | ORAL | Status: AC
  Filled 2018-10-21: qty 1

## 2018-10-21 MED ORDER — KETAMINE HCL 10 MG/ML IJ SOLN
INTRAMUSCULAR | Status: DC | PRN
  Administered 2018-10-21: 30 mg via INTRAVENOUS
  Administered 2018-10-21: 20 mg via INTRAVENOUS

## 2018-10-21 MED ORDER — SUCCINYLCHOLINE CHLORIDE 200 MG/10ML IV SOSY
PREFILLED_SYRINGE | INTRAVENOUS | Status: AC
  Filled 2018-10-21: qty 10

## 2018-10-21 MED ORDER — ROCURONIUM BROMIDE 50 MG/5ML IV SOSY
PREFILLED_SYRINGE | INTRAVENOUS | Status: AC
  Filled 2018-10-21: qty 5

## 2018-10-21 MED ORDER — PROPOFOL 10 MG/ML IV BOLUS
INTRAVENOUS | Status: DC | PRN
  Administered 2018-10-21: 100 mg via INTRAVENOUS

## 2018-10-21 MED ORDER — ONDANSETRON HCL 4 MG/2ML IJ SOLN
INTRAMUSCULAR | Status: AC
  Filled 2018-10-21: qty 2

## 2018-10-21 MED ORDER — ACETAMINOPHEN 500 MG PO TABS
ORAL_TABLET | ORAL | Status: AC
  Filled 2018-10-21: qty 2

## 2018-10-21 MED ORDER — EPHEDRINE SULFATE-NACL 50-0.9 MG/10ML-% IV SOSY
PREFILLED_SYRINGE | INTRAVENOUS | Status: DC | PRN
  Administered 2018-10-21: 10 mg via INTRAVENOUS

## 2018-10-21 MED ORDER — DEXMEDETOMIDINE HCL IN NACL 200 MCG/50ML IV SOLN
INTRAVENOUS | Status: AC
  Filled 2018-10-21: qty 50

## 2018-10-21 MED ORDER — FENTANYL CITRATE (PF) 250 MCG/5ML IJ SOLN
INTRAMUSCULAR | Status: AC
  Filled 2018-10-21: qty 5

## 2018-10-21 MED ORDER — ONDANSETRON HCL 4 MG/2ML IJ SOLN
INTRAMUSCULAR | Status: DC | PRN
  Administered 2018-10-21 (×2): 4 mg via INTRAVENOUS

## 2018-10-21 MED ORDER — CEFAZOLIN SODIUM-DEXTROSE 2-4 GM/100ML-% IV SOLN
INTRAVENOUS | Status: AC
  Filled 2018-10-21: qty 100

## 2018-10-21 MED ORDER — CELECOXIB 200 MG PO CAPS: 200.0000 mg | ORAL_CAPSULE | ORAL | Status: DC

## 2018-10-21 MED ORDER — LIDOCAINE 2% (20 MG/ML) 5 ML SYRINGE
INTRAMUSCULAR | Status: AC
  Filled 2018-10-21: qty 5

## 2018-10-21 MED ORDER — ACETAMINOPHEN 500 MG PO TABS
1000.0000 mg | ORAL_TABLET | ORAL | Status: AC
  Administered 2018-10-21: 1000 mg via ORAL

## 2018-10-21 MED ORDER — KETAMINE HCL 50 MG/5ML IJ SOSY
PREFILLED_SYRINGE | INTRAMUSCULAR | Status: AC
  Filled 2018-10-21: qty 5

## 2018-10-21 MED ORDER — PROMETHAZINE HCL 25 MG/ML IJ SOLN: 6.2500 mg | INTRAMUSCULAR | Status: DC | PRN

## 2018-10-21 MED ORDER — SUCCINYLCHOLINE CHLORIDE 20 MG/ML IJ SOLN
INTRAMUSCULAR | Status: DC | PRN
  Administered 2018-10-21: 140 mg via INTRAVENOUS

## 2018-10-21 MED ORDER — LIDOCAINE IN D5W 4-5 MG/ML-% IV SOLN
1.0000 mg/min | INTRAVENOUS | Status: AC
  Administered 2018-10-21: 25 ug/kg/min via INTRAVENOUS
  Filled 2018-10-21: qty 500

## 2018-10-21 MED ORDER — BUPIVACAINE HCL 0.25 % IJ SOLN
INTRAMUSCULAR | Status: DC | PRN
  Administered 2018-10-21: 30 mL

## 2018-10-21 MED ORDER — CEFAZOLIN SODIUM-DEXTROSE 2-4 GM/100ML-% IV SOLN
2.0000 g | INTRAVENOUS | Status: AC
  Administered 2018-10-21: 2 g via INTRAVENOUS

## 2018-10-21 MED ORDER — GABAPENTIN 300 MG PO CAPS
300.0000 mg | ORAL_CAPSULE | ORAL | Status: AC
  Administered 2018-10-21: 300 mg via ORAL

## 2018-10-21 MED ORDER — GLYCOPYRROLATE PF 0.2 MG/ML IJ SOSY
PREFILLED_SYRINGE | INTRAMUSCULAR | Status: AC
  Filled 2018-10-21: qty 1

## 2018-10-21 MED ORDER — TRAMADOL HCL 50 MG PO TABS
50.0000 mg | ORAL_TABLET | Freq: Once | ORAL | Status: AC
  Administered 2018-10-21: 50 mg via ORAL

## 2018-10-21 MED ORDER — DEXMEDETOMIDINE HCL IN NACL 200 MCG/50ML IV SOLN
INTRAVENOUS | Status: DC | PRN
  Administered 2018-10-21: 8 ug via INTRAVENOUS
  Administered 2018-10-21: 12 ug via INTRAVENOUS
  Administered 2018-10-21: 8 ug via INTRAVENOUS

## 2018-10-21 MED ORDER — MIDAZOLAM HCL 2 MG/2ML IJ SOLN
INTRAMUSCULAR | Status: AC
  Filled 2018-10-21: qty 2

## 2018-10-21 MED ORDER — SUGAMMADEX SODIUM 200 MG/2ML IV SOLN
INTRAVENOUS | Status: DC | PRN
  Administered 2018-10-21: 150 mg via INTRAVENOUS
  Administered 2018-10-21: 50 mg via INTRAVENOUS

## 2018-10-21 MED ORDER — FENTANYL CITRATE (PF) 100 MCG/2ML IJ SOLN: 25.0000 ug | INTRAMUSCULAR | Status: DC | PRN

## 2018-10-21 MED ORDER — 0.9 % SODIUM CHLORIDE (POUR BTL) OPTIME
TOPICAL | Status: DC | PRN
  Administered 2018-10-21: 1000 mL

## 2018-10-21 MED ORDER — CELECOXIB 200 MG PO CAPS
ORAL_CAPSULE | ORAL | Status: AC
  Administered 2018-10-21: 200 mg
  Filled 2018-10-21: qty 1

## 2018-10-21 MED ORDER — MIDAZOLAM HCL 5 MG/5ML IJ SOLN
INTRAMUSCULAR | Status: DC | PRN
  Administered 2018-10-21: 2 mg via INTRAVENOUS

## 2018-10-21 MED ORDER — LIDOCAINE 2% (20 MG/ML) 5 ML SYRINGE
INTRAMUSCULAR | Status: DC | PRN
  Administered 2018-10-21: 60 mg via INTRAVENOUS

## 2018-10-21 SURGICAL SUPPLY — 50 items
ADH SKN CLS APL DERMABOND .7 (GAUZE/BANDAGES/DRESSINGS) ×1
APPLICATOR COTTON TIP 6 STRL (MISCELLANEOUS) ×2 IMPLANT
APPLICATOR COTTON TIP 6IN STRL (MISCELLANEOUS) ×4
APPLIER CLIP LOGIC TI 5 (MISCELLANEOUS) IMPLANT
BINDER ABDOMINAL 12 ML 46-62 (SOFTGOODS) ×2 IMPLANT
CANISTER SUCT 3000ML PPV (MISCELLANEOUS) ×2 IMPLANT
CHLORAPREP W/TINT 26ML (MISCELLANEOUS) ×2 IMPLANT
COVER SURGICAL LIGHT HANDLE (MISCELLANEOUS) ×2 IMPLANT
COVER WAND RF STERILE (DRAPES) IMPLANT
DEFOGGER SCOPE WARMER CLEARIFY (MISCELLANEOUS) ×2 IMPLANT
DERMABOND ADVANCED (GAUZE/BANDAGES/DRESSINGS) ×1
DERMABOND ADVANCED .7 DNX12 (GAUZE/BANDAGES/DRESSINGS) ×1 IMPLANT
DEVICE SECURE STRAP 25 ABSORB (INSTRUMENTS) ×4 IMPLANT
DRSG PAD ABDOMINAL 8X10 ST (GAUZE/BANDAGES/DRESSINGS) ×2 IMPLANT
ELECT REM PT RETURN 9FT ADLT (ELECTROSURGICAL) ×2
ELECTRODE REM PT RTRN 9FT ADLT (ELECTROSURGICAL) ×1 IMPLANT
GLOVE BIO SURGEON STRL SZ7.5 (GLOVE) ×2 IMPLANT
GOWN STRL REUS W/ TWL LRG LVL3 (GOWN DISPOSABLE) ×3 IMPLANT
GOWN STRL REUS W/ TWL XL LVL3 (GOWN DISPOSABLE) ×1 IMPLANT
GOWN STRL REUS W/TWL LRG LVL3 (GOWN DISPOSABLE) ×3
GOWN STRL REUS W/TWL XL LVL3 (GOWN DISPOSABLE) ×1
GRASPER SUT TROCAR 14GX15 (MISCELLANEOUS) ×2 IMPLANT
KIT BASIN OR (CUSTOM PROCEDURE TRAY) ×2 IMPLANT
KIT TURNOVER KIT B (KITS) ×2 IMPLANT
MARKER SKIN DUAL TIP RULER LAB (MISCELLANEOUS) ×2 IMPLANT
MESH VENTRALIGHT ST 8IN CRC (Mesh General) ×2 IMPLANT
NEEDLE INSUFFLATION 14GA 120MM (NEEDLE) ×2 IMPLANT
NEEDLE SPNL 22GX3.5 QUINCKE BK (NEEDLE) ×2 IMPLANT
NS IRRIG 1000ML POUR BTL (IV SOLUTION) ×2 IMPLANT
PAD ARMBOARD 7.5X6 YLW CONV (MISCELLANEOUS) ×4 IMPLANT
POUCH LAPAROSCOPIC INSTRUMENT (MISCELLANEOUS) IMPLANT
SCISSORS LAP 5X35 DISP (ENDOMECHANICALS) ×2 IMPLANT
SET IRRIG TUBING LAPAROSCOPIC (IRRIGATION / IRRIGATOR) IMPLANT
SET TUBE SMOKE EVAC HIGH FLOW (TUBING) ×2 IMPLANT
SHEARS HARMONIC ACE PLUS 36CM (ENDOMECHANICALS) IMPLANT
SLEEVE ENDOPATH XCEL 5M (ENDOMECHANICALS) ×8 IMPLANT
SUT CHROMIC 2 0 SH (SUTURE) IMPLANT
SUT ETHIBOND 0 36 GRN (SUTURE) ×4 IMPLANT
SUT ETHIBOND 0 MO6 C/R (SUTURE) ×2 IMPLANT
SUT MNCRL AB 3-0 PS2 18 (SUTURE) ×2 IMPLANT
SUT NOVA NAB DX-16 0-1 5-0 T12 (SUTURE) ×4 IMPLANT
SUT PROLENE 2 0 KS (SUTURE) ×4 IMPLANT
SUT SILK 3 0 SH 30 (SUTURE) ×4 IMPLANT
SYR CONTROL 10ML LL (SYRINGE) ×2 IMPLANT
TOWEL OR 17X24 6PK STRL BLUE (TOWEL DISPOSABLE) IMPLANT
TOWEL OR 17X26 10 PK STRL BLUE (TOWEL DISPOSABLE) ×2 IMPLANT
TRAY LAPAROSCOPIC MC (CUSTOM PROCEDURE TRAY) ×2 IMPLANT
TROCAR XCEL NON-BLD 11X100MML (ENDOMECHANICALS) IMPLANT
TROCAR XCEL NON-BLD 5MMX100MML (ENDOMECHANICALS) ×2 IMPLANT
WATER STERILE IRR 1000ML POUR (IV SOLUTION) IMPLANT

## 2018-10-21 NOTE — Anesthesia Postprocedure Evaluation (Signed)
Anesthesia Post Note  Patient: Kristin Webb  Procedure(s) Performed: LAPAROSCOPIC INCISIONAL HERNIA REPAIR WITH MESH (N/A Abdomen)     Patient location during evaluation: PACU Anesthesia Type: General Level of consciousness: sedated Pain management: pain level controlled Vital Signs Assessment: post-procedure vital signs reviewed and stable Respiratory status: spontaneous breathing and respiratory function stable Cardiovascular status: stable Postop Assessment: no apparent nausea or vomiting Anesthetic complications: no    Last Vitals:  Vitals:   10/21/18 1400 10/21/18 1440  BP: 117/74 121/69  Pulse: 76 69  Resp: (!) 24   Temp:    SpO2: 99% 93%    Last Pain:  Vitals:   10/21/18 1440  TempSrc:   PainSc: 4                  Kristol Almanzar DANIEL

## 2018-10-21 NOTE — Progress Notes (Signed)
Ambulatory x 40 feet with standby assit/ minimal discomfort

## 2018-10-21 NOTE — Op Note (Signed)
10/21/2018  1:30 PM  PATIENT:  Kristin Webb  73 y.o. female  PRE-OPERATIVE DIAGNOSIS:  INCISIONAL HERNIA  POST-OPERATIVE DIAGNOSIS:  Incision Hernia 6cm in epigastrum  PROCEDURE:  Procedure(s): LAPAROSCOPIC INCISIONAL HERNIA REPAIR WITH MESH (N/A)  SURGEON:  Surgeon(s) and Role:    * Ralene Ok, MD - Primary  ASSISTANTS: none   ANESTHESIA:   Local and general   EBL:  minimal   BLOOD ADMINISTERED:none  DRAINS: none   LOCAL MEDICATIONS USED:  BUPIVICAINE   SPECIMEN:  No Specimen  DISPOSITION OF SPECIMEN:  N/A  COUNTS:  YES  TOURNIQUET:  * No tourniquets in log *  DICTATION: .Dragon Dictation  Details of the procedure:   After the patient was consented patient was taken back to the operating room patient was then placed in supine position bilateral SCDs in place.  The patient was prepped and draped in the usual sterile fashion. After antibiotics were confirmed a timeout was called and all facts were verified. The Veress needle technique was used to insuflate the abdomen at Palmer's point. The abdomen was insufflated to 14 mm mercury. Subsequently a 5 mm trocar was placed a camera inserted there was no injury to any intra-abdominal organs.    There was seen to be an non-incarcerated  6cm epigastric hernia that was just cephalad to the hernia placement.  The edge of the previous mesh could easily be seen.  A second 79mm camera port was in placed into the left lower quadrant and another in the infraumbilical midline.   At this the midline adhesions were taken down with sharp dissection.  The adhesions consisted of thin adhesions and there was no bowel involved.    The hernia was easily exposed and was seen to be containing fat from the falciform.  This was reduced.  The fascia at the hernia was reapproximated using a 0 Ethibonds x 6.  a Bard Ventralight 20.3cm  mesh was inserted into the abdomen.  The mesh was secured circumferentially with am Securestrap tacker in a double  crown fashion.  The hernia mesh was not tacked above the costal margins bilateral.  The mesh overlay this area onto the left and right lobes of the liver.  The overlay of the mesh >5cm in all directions.    The omentum was brought over the area of the mesh. The pneumoperitoneum was evacuated  & all trocars  were removed. The skin was reapproximated with 4-0  Monocryl sutures in a subcuticular fashion. The skin was dressed with Dermabond.  The patient was taken to the recovery room in stable condition.  Type of repair -primary suture & mesh  Mesh overlap - 5+cm  Placement of mesh -  beneath fascia and into peritoneal cavity  PLAN OF CARE: Discharge to home after PACU  PATIENT DISPOSITION:  PACU - hemodynamically stable.   Delay start of Pharmacological VTE agent (>24hrs) due to surgical blood loss or risk of bleeding: not applicable

## 2018-10-21 NOTE — Discharge Instructions (Signed)
[facility] _______[facility] ° °HERNIA REPAIR: POST OP INSTRUCTIONS ° °Always review your discharge instruction sheet given to you by the facility where your surgery was performed. °IF YOU HAVE DISABILITY OR FAMILY LEAVE FORMS, YOU MUST BRING THEM TO THE OFFICE FOR PROCESSING.   °DO NOT GIVE THEM TO YOUR DOCTOR. ° °1. A  prescription for pain medication may be given to you upon discharge.  Take your pain medication as prescribed, if needed.  If narcotic pain medicine is not needed, then you may take acetaminophen (Tylenol) or ibuprofen (Advil) as needed. °2. Take your usually prescribed medications unless otherwise directed. °If you need a refill on your pain medication, please contact your pharmacy.  They will contact our office to request authorization. Prescriptions will not be filled after 5 pm or on week-ends. °3. You should follow a light diet the first 24 hours after arrival home, such as soup and crackers, etc.  Be sure to include lots of fluids daily.  Resume your normal diet the day after surgery. °4.Most patients will experience some swelling and bruising around the umbilicus or in the groin and scrotum.  Ice packs and reclining will help.  Swelling and bruising can take several days to resolve.  °6. It is common to experience some constipation if taking pain medication after surgery.  Increasing fluid intake and taking a stool softener (such as Colace) will usually help or prevent this problem from occurring.  A mild laxative (Milk of Magnesia or Miralax) should be taken according to package directions if there are no bowel movements after 48 hours. °7. Unless discharge instructions indicate otherwise, you may remove your bandages 24-48 hours after surgery, and you may shower at that time.  You may have steri-strips (small skin tapes) in place directly over the incision.  These strips should be left on the skin for 7-10 days.  If your surgeon used skin glue on the incision, you may shower in  24 hours.  The glue will flake off over the next 2-3 weeks.  Any sutures or staples will be removed at the office during your follow-up visit. °8. ACTIVITIES:  You may resume regular (light) daily activities beginning the next day--such as daily self-care, walking, climbing stairs--gradually increasing activities as tolerated.  You may have sexual intercourse when it is comfortable.  Refrain from any heavy lifting or straining until approved by your doctor. ° °a.You may drive when you are no longer taking prescription pain medication, you can comfortably wear a seatbelt, and you can safely maneuver your car and apply brakes. °b.RETURN TO WORK:   °_____________________________________________ ° °9.You should see your doctor in the office for a follow-up appointment approximately 2-3 weeks after your surgery.  Make sure that you call for this appointment within a day or two after you arrive home to insure a convenient appointment time. °10.OTHER INSTRUCTIONS: _________________________ °   _____________________________________ ° °WHEN TO CALL YOUR DOCTOR: °1. Fever over 101.0 °2. Inability to urinate °3. Nausea and/or vomiting °4. Extreme swelling or bruising °5. Continued bleeding from incision. °6. Increased pain, redness, or drainage from the incision ° °The clinic staff is available to answer your questions during regular business hours.  Please don’t hesitate to call and ask to speak to one of the nurses for clinical concerns.  If you have a medical emergency, go to the nearest emergency room or call 911.  A surgeon from [facility] is always on call at the hospital ° ° °[address], [address], [place], [place]  [address] ? ° [address] [address], [place], [place]   [address] °[phone] ? [phone] ? FAX [phone] °Web site: [email or web address] ° °

## 2018-10-21 NOTE — Interval H&P Note (Signed)
History and Physical Interval Note:  10/21/2018 11:13 AM  Kristin Webb  has presented today for surgery, with the diagnosis of INCISIONAL HERNIA.  The various methods of treatment have been discussed with the patient and family. After consideration of risks, benefits and other options for treatment, the patient has consented to  Procedure(s): [place] (N/A) as a surgical intervention.  The patient's history has been reviewed, patient examined, no change in status, stable for surgery.  I have reviewed the patient's chart and labs.  Questions were answered to the patient's satisfaction.     Ralene Ok

## 2018-10-21 NOTE — Anesthesia Preprocedure Evaluation (Signed)
Anesthesia Evaluation  Patient identified by MRN, date of birth, ID band Patient awake    Reviewed: Allergy & Precautions, H&P , NPO status , Patient's Chart, lab work & pertinent test results  History of Anesthesia Complications (+) PONV and history of anesthetic complications  Airway Mallampati: II  TM Distance: >3 FB Neck ROM: Full    Dental no notable dental hx. (+) Teeth Intact, Dental Advisory Given   Pulmonary sleep apnea and Continuous Positive Airway Pressure Ventilation ,    Pulmonary exam normal breath sounds clear to auscultation       Cardiovascular hypertension, Pt. on medications  Rhythm:Regular Rate:Normal     Neuro/Psych PSYCHIATRIC DISORDERS Anxiety Depression negative neurological ROS     GI/Hepatic Neg liver ROS, GERD  Medicated and Controlled,  Endo/Other  diabetesMorbid obesity  Renal/GU Renal disease  negative genitourinary   Musculoskeletal  (+) Fibromyalgia -  Abdominal   Peds  Hematology negative hematology ROS (+)   Anesthesia Other Findings   Reproductive/Obstetrics negative OB ROS                             Anesthesia Physical  Anesthesia Plan  ASA: II  Anesthesia Plan: General   Post-op Pain Management:    Induction: Intravenous  PONV Risk Score and Plan: 4 or greater and Ondansetron, Dexamethasone, Scopolamine patch - Pre-op and Diphenhydramine  Airway Management Planned: Oral ETT  Additional Equipment:   Intra-op Plan:   Post-operative Plan: Extubation in OR  Informed Consent: I have reviewed the patients History and Physical, chart, labs and discussed the procedure including the risks, benefits and alternatives for the proposed anesthesia with the patient or authorized representative who has indicated his/her understanding and acceptance.     Dental advisory given  Plan Discussed with: CRNA and Anesthesiologist  Anesthesia Plan  Comments:         Anesthesia Quick Evaluation

## 2018-10-21 NOTE — Anesthesia Procedure Notes (Signed)
Procedure Name: Intubation Date/Time: 10/21/2018 12:17 PM Performed by: Oletta Lamas, CRNA Pre-anesthesia Checklist: Patient identified, Emergency Drugs available, Suction available and Patient being monitored Patient Re-evaluated:Patient Re-evaluated prior to induction Oxygen Delivery Method: Circle system utilized Preoxygenation: Pre-oxygenation with 100% oxygen Induction Type: IV induction Ventilation: Mask ventilation without difficulty and Oral airway inserted - appropriate to patient size Laryngoscope Size: Mac and 3 Grade View: Grade I Tube type: Oral Tube size: 6.5 mm Number of attempts: 1 Intubation method: external pressure. Placement Confirmation: ETT inserted through vocal cords under direct vision,  positive ETCO2 and breath sounds checked- equal and bilateral Secured at: 21 cm Tube secured with: Tape Dental Injury: Teeth and Oropharynx as per pre-operative assessment

## 2018-10-21 NOTE — Transfer of Care (Signed)
Immediate Anesthesia Transfer of Care Note  Patient: Kristin Webb  Procedure(s) Performed: LAPAROSCOPIC INCISIONAL HERNIA REPAIR WITH MESH (N/A Abdomen)  Patient Location: PACU  Anesthesia Type:General  Level of Consciousness: sedated  Airway & Oxygen Therapy: Patient Spontanous Breathing and Patient connected to nasal cannula oxygen  Post-op Assessment: Report given to RN and Post -op Vital signs reviewed and stable  Post vital signs: Reviewed and stable  Last Vitals:  Vitals Value Taken Time  BP    Temp    Pulse 74 10/21/2018  1:52 PM  Resp 21 10/21/2018  1:52 PM  SpO2 97 % 10/21/2018  1:52 PM  Vitals shown include unvalidated device data.  Last Pain:  Vitals:   10/21/18 0943  TempSrc: Oral         Complications: No apparent anesthesia complications

## 2018-10-23 ENCOUNTER — Other Ambulatory Visit: Payer: Self-pay | Admitting: Diagnostic Neuroimaging

## 2018-10-23 DIAGNOSIS — F5102 Adjustment insomnia: Secondary | ICD-10-CM

## 2018-10-23 DIAGNOSIS — G4719 Other hypersomnia: Secondary | ICD-10-CM

## 2018-10-23 DIAGNOSIS — Z9989 Dependence on other enabling machines and devices: Secondary | ICD-10-CM

## 2018-10-23 DIAGNOSIS — G4733 Obstructive sleep apnea (adult) (pediatric): Secondary | ICD-10-CM

## 2018-10-24 ENCOUNTER — Encounter (HOSPITAL_COMMUNITY): Payer: Self-pay | Admitting: General Surgery

## 2018-10-24 NOTE — Telephone Encounter (Signed)
90 days of Lunesta generic form.

## 2018-11-21 ENCOUNTER — Other Ambulatory Visit: Payer: Self-pay | Admitting: Neurology

## 2018-11-21 DIAGNOSIS — G4719 Other hypersomnia: Secondary | ICD-10-CM

## 2018-11-21 DIAGNOSIS — G4733 Obstructive sleep apnea (adult) (pediatric): Secondary | ICD-10-CM

## 2018-11-21 DIAGNOSIS — F5102 Adjustment insomnia: Secondary | ICD-10-CM

## 2018-11-21 DIAGNOSIS — Z9989 Dependence on other enabling machines and devices: Secondary | ICD-10-CM

## 2018-11-24 ENCOUNTER — Telehealth: Payer: Self-pay

## 2018-11-24 NOTE — Telephone Encounter (Signed)
Faxed office notes by Dr. Gerarda Fraction dated 03-02-18 and  06-01-18 to substantiate genetic testing for reimbursement.Marland Kitchen

## 2018-12-22 DIAGNOSIS — M79645 Pain in left finger(s): Secondary | ICD-10-CM | POA: Diagnosis not present

## 2018-12-22 DIAGNOSIS — F4310 Post-traumatic stress disorder, unspecified: Secondary | ICD-10-CM | POA: Diagnosis not present

## 2019-01-18 DIAGNOSIS — G4733 Obstructive sleep apnea (adult) (pediatric): Secondary | ICD-10-CM | POA: Diagnosis not present

## 2019-01-22 ENCOUNTER — Other Ambulatory Visit: Payer: Self-pay | Admitting: Neurology

## 2019-01-22 DIAGNOSIS — G4733 Obstructive sleep apnea (adult) (pediatric): Secondary | ICD-10-CM

## 2019-01-22 DIAGNOSIS — F5102 Adjustment insomnia: Secondary | ICD-10-CM

## 2019-01-22 DIAGNOSIS — G4719 Other hypersomnia: Secondary | ICD-10-CM

## 2019-01-25 DIAGNOSIS — E782 Mixed hyperlipidemia: Secondary | ICD-10-CM | POA: Diagnosis not present

## 2019-01-25 DIAGNOSIS — I10 Essential (primary) hypertension: Secondary | ICD-10-CM | POA: Diagnosis not present

## 2019-01-25 DIAGNOSIS — M159 Polyosteoarthritis, unspecified: Secondary | ICD-10-CM | POA: Diagnosis not present

## 2019-04-03 ENCOUNTER — Other Ambulatory Visit: Payer: Self-pay | Admitting: Neurology

## 2019-04-03 DIAGNOSIS — G4733 Obstructive sleep apnea (adult) (pediatric): Secondary | ICD-10-CM

## 2019-04-03 DIAGNOSIS — G4719 Other hypersomnia: Secondary | ICD-10-CM

## 2019-04-03 DIAGNOSIS — F5102 Adjustment insomnia: Secondary | ICD-10-CM

## 2019-04-18 DIAGNOSIS — G4733 Obstructive sleep apnea (adult) (pediatric): Secondary | ICD-10-CM | POA: Diagnosis not present

## 2019-04-21 IMAGING — CT CT ABD-PELV W/ CM
3 of 5 series · 15 of 46 positions shown, 17 images · IV contrast (ISOVUE 300)
Comparison: CT 07/09/2015.

CLINICAL DATA: Abdominal pain. History of ovarian cancer diagnosed
in 5995. Status post hysterectomy, cholecystectomy and completion of
chemotherapy.

EXAM:
CT ABDOMEN AND PELVIS WITH CONTRAST
TECHNIQUE: Multidetector CT imaging of the abdomen and pelvis was performed
using the standard protocol following bolus administration of
intravenous contrast.
CONTRAST:  100mL CWH936-SPP IOPAMIDOL (CWH936-SPP) INJECTION 61%

[Series 2: axial st · axial · 0.89mm/px · z∈[-372,+28]mm · 11 of 98 slices shown, 13 images]
[im 9/98  soft-tissue]
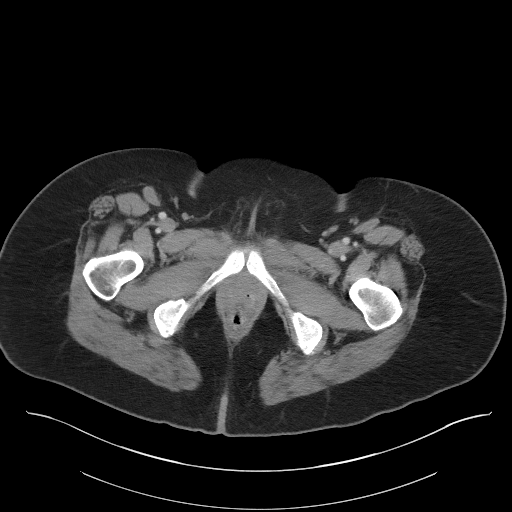
[im 9/98  bone]
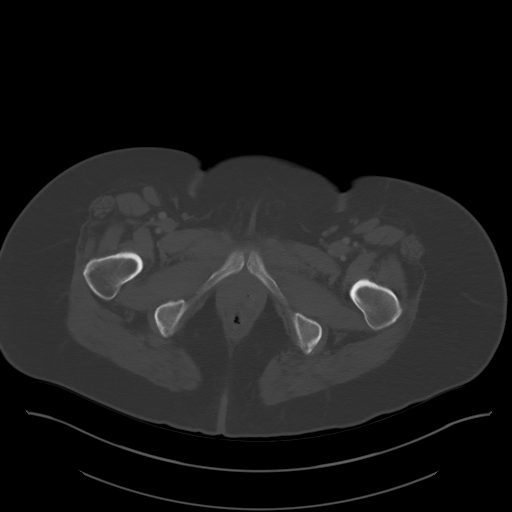
[im 17/98  soft-tissue]
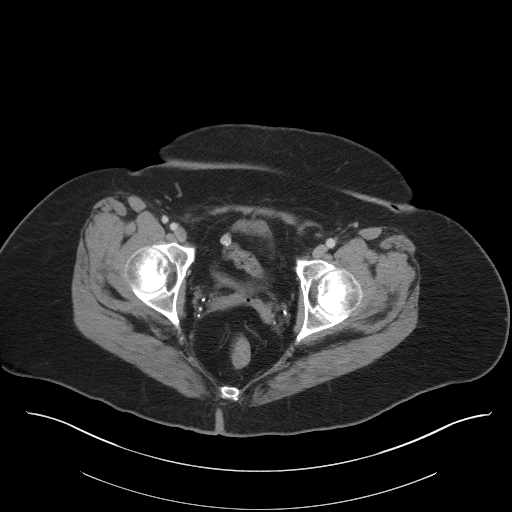
[im 25/98  soft-tissue]
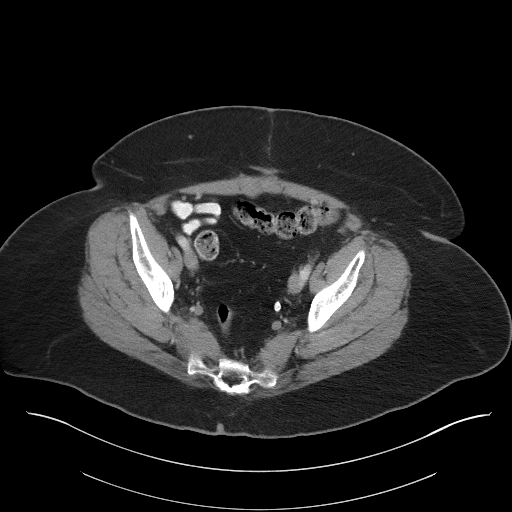
[im 33/98  soft-tissue]
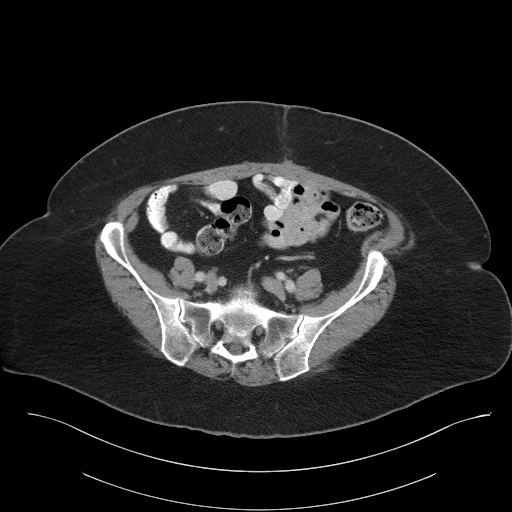
[im 41/98  soft-tissue]
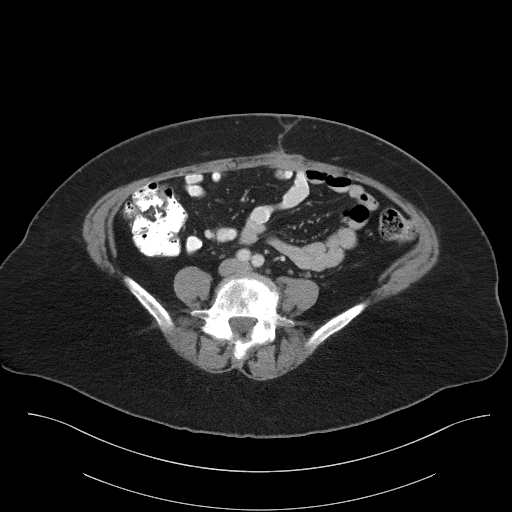
[im 49/98  soft-tissue]
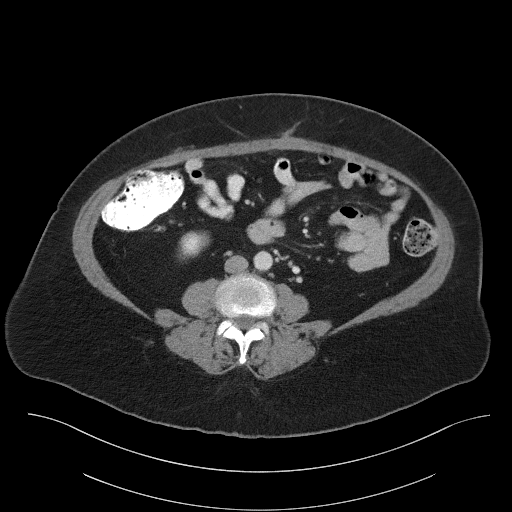
[im 57/98  soft-tissue]
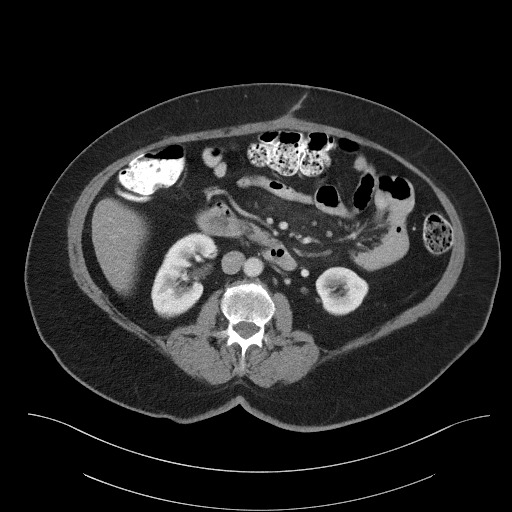
[im 65/98  soft-tissue]
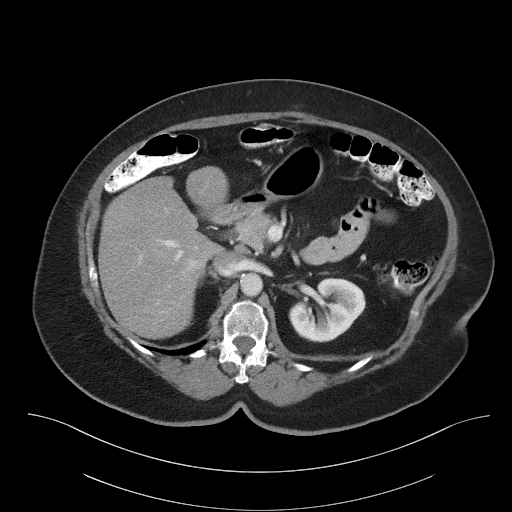
[im 73/98  soft-tissue]
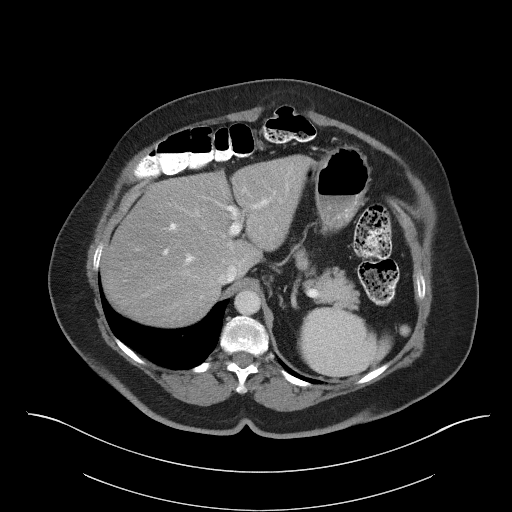
[im 73/98  bone]
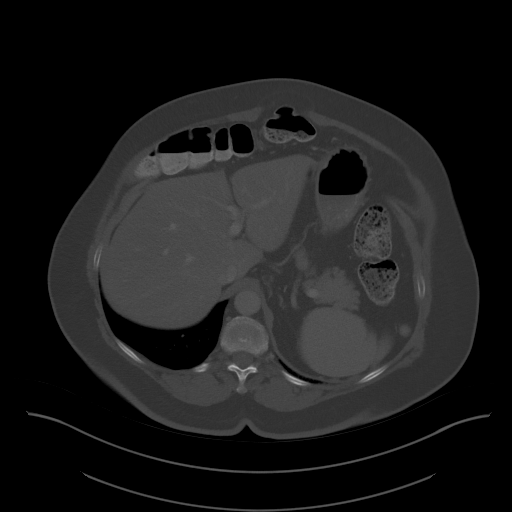
[im 81/98  soft-tissue]
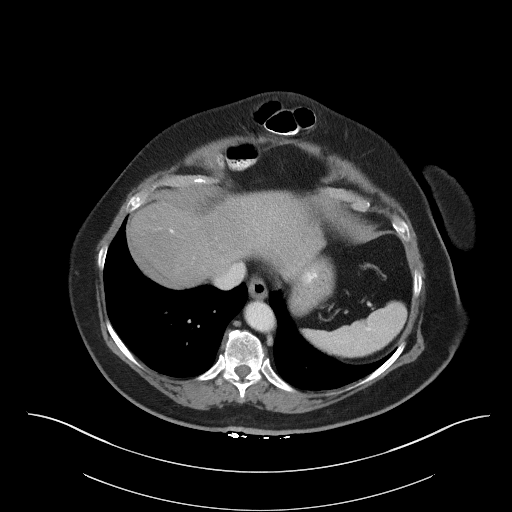
[im 89/98  soft-tissue]
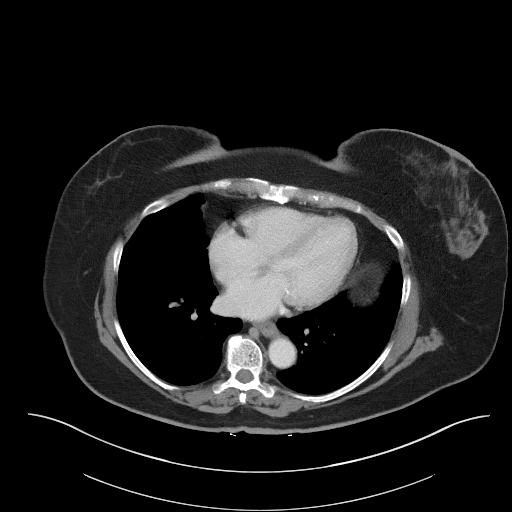

[Series 4: lung bases · axial · 0.89mm/px · 1 of 95 slices shown]
[im 8/95  bone]
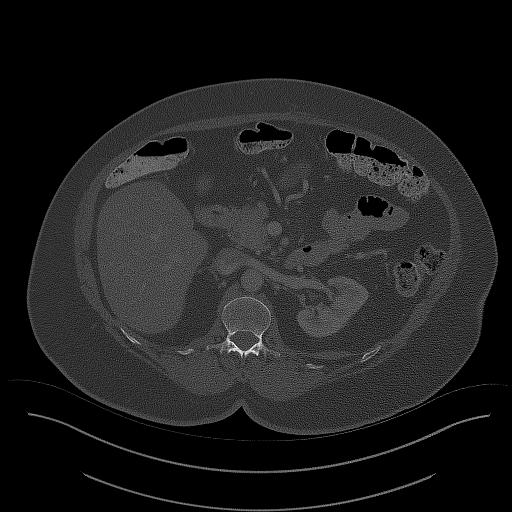

[Series 5: coronal st · coronal · 0.85mm/px · 3 of 115 slices shown]
[im 39/115  soft-tissue]
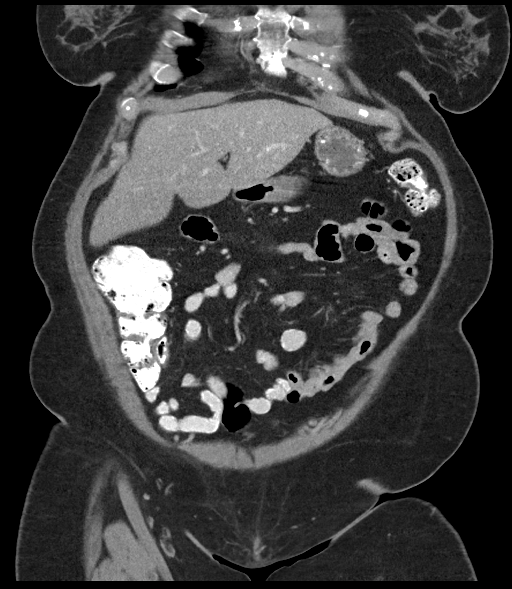
[im 51/115  soft-tissue]
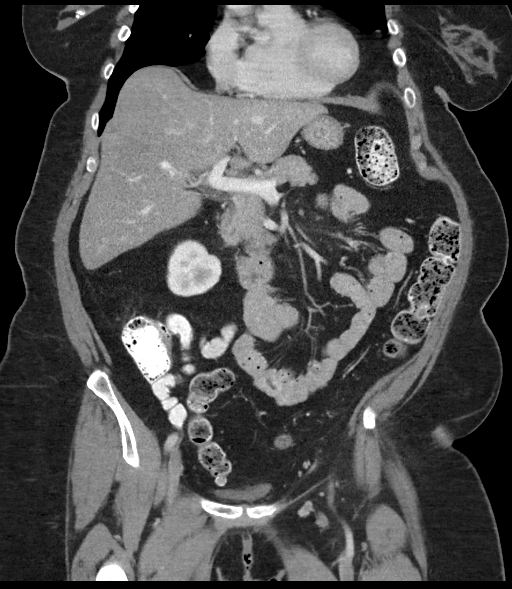
[im 64/115  soft-tissue]
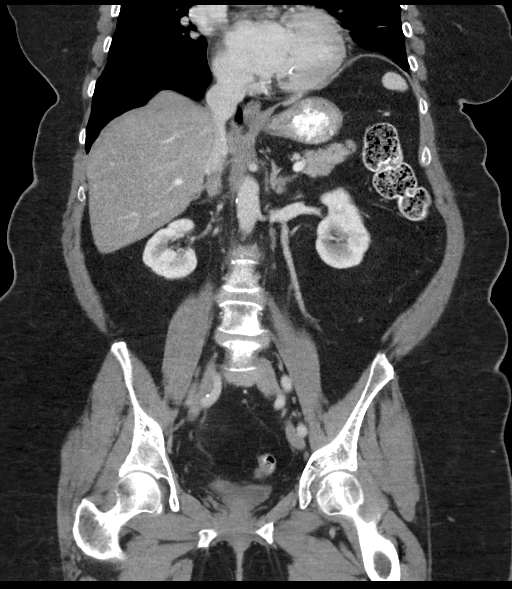

[15 of 46 positions shown; findings below may reference images not displayed]

FINDINGS: Lower chest: Clear lung bases. No significant pleural or pericardial
effusion. Central venous catheter extends to the superior cavoatrial
junction.

Hepatobiliary: Mildly decreased hepatic density suspicious for
steatosis. There is a 1 cm exophytic lesion near the falciform
ligament on image 27 which is stable, likely focal fat. No worrisome
hepatic findings. No significant biliary dilatation status post
cholecystectomy.

Pancreas: Unremarkable. No pancreatic ductal dilatation or
surrounding inflammatory changes.

Spleen: Normal in size without focal abnormality.

Adrenals/Urinary Tract: Both adrenal glands appear normal. There is
a stable 6 mm angiomyolipoma in the mid right kidney and a probable
tiny cyst in the upper pole the left kidney. No evidence of renal
mass, recurrent urinary tract calculus or hydronephrosis. The
bladder is nearly empty and suboptimally evaluated.

Stomach/Bowel: No evidence of bowel wall thickening, distention or
surrounding inflammatory change. Nonvisualized appendix, probably
surgically absent.

Vascular/Lymphatic: There are no enlarged abdominal or pelvic lymph
nodes. There are stable small lymph nodes within the porta hepatis,
mesentery and retroperitoneum. Mild aortic and branch vessel
atherosclerosis.

Reproductive: Hysterectomy.  No evidence of adnexal mass.

Other: Presumed interval repair of anterior abdominal wall hernia.
There is a recurrent hernia in the subxiphoid area which contains a
small portion of the transverse colon. No evidence of incarceration
for obstruction. There is no residual small bowel herniation. No
ascites or peritoneal nodularity.

Musculoskeletal: No acute or significant osseous findings. There is
progressive lower lumbar spondylosis with discogenic endplate
sclerosis at L4-5.
IMPRESSION: 1. No evidence of metastatic ovarian cancer.
2. Recurrent subxiphoid ventral abdominal wall hernia containing
transverse colon. No evidence of incarceration or obstruction.
3. Stable incidental findings in the liver and kidneys. No recurrent
urinary tract calculus.
4. Progressive lower lumbar spondylosis.
5.  Aortic Atherosclerosis ([id number]).

## 2019-04-28 ENCOUNTER — Other Ambulatory Visit: Payer: Self-pay | Admitting: Neurology

## 2019-04-28 DIAGNOSIS — F5102 Adjustment insomnia: Secondary | ICD-10-CM

## 2019-04-28 DIAGNOSIS — G4733 Obstructive sleep apnea (adult) (pediatric): Secondary | ICD-10-CM

## 2019-05-02 ENCOUNTER — Telehealth: Payer: Self-pay | Admitting: *Deleted

## 2019-05-02 ENCOUNTER — Encounter: Payer: Self-pay | Admitting: Obstetrics

## 2019-05-02 DIAGNOSIS — M545 Low back pain: Secondary | ICD-10-CM | POA: Diagnosis not present

## 2019-05-02 DIAGNOSIS — E782 Mixed hyperlipidemia: Secondary | ICD-10-CM | POA: Diagnosis not present

## 2019-05-02 DIAGNOSIS — E559 Vitamin D deficiency, unspecified: Secondary | ICD-10-CM | POA: Diagnosis not present

## 2019-05-02 DIAGNOSIS — Z0000 Encounter for general adult medical examination without abnormal findings: Secondary | ICD-10-CM | POA: Diagnosis not present

## 2019-05-02 DIAGNOSIS — E1159 Type 2 diabetes mellitus with other circulatory complications: Secondary | ICD-10-CM | POA: Diagnosis not present

## 2019-05-02 DIAGNOSIS — G4733 Obstructive sleep apnea (adult) (pediatric): Secondary | ICD-10-CM | POA: Diagnosis not present

## 2019-05-02 DIAGNOSIS — M797 Fibromyalgia: Secondary | ICD-10-CM | POA: Diagnosis not present

## 2019-05-02 DIAGNOSIS — M85851 Other specified disorders of bone density and structure, right thigh: Secondary | ICD-10-CM | POA: Diagnosis not present

## 2019-05-02 DIAGNOSIS — I10 Essential (primary) hypertension: Secondary | ICD-10-CM | POA: Diagnosis not present

## 2019-05-02 NOTE — Telephone Encounter (Signed)
Returned the patient's call and scheduled appt

## 2019-05-03 ENCOUNTER — Other Ambulatory Visit: Payer: Self-pay | Admitting: Family Medicine

## 2019-05-03 DIAGNOSIS — M159 Polyosteoarthritis, unspecified: Secondary | ICD-10-CM | POA: Diagnosis not present

## 2019-05-03 DIAGNOSIS — I10 Essential (primary) hypertension: Secondary | ICD-10-CM | POA: Diagnosis not present

## 2019-05-03 DIAGNOSIS — E1159 Type 2 diabetes mellitus with other circulatory complications: Secondary | ICD-10-CM | POA: Diagnosis not present

## 2019-05-03 DIAGNOSIS — E782 Mixed hyperlipidemia: Secondary | ICD-10-CM | POA: Diagnosis not present

## 2019-05-03 DIAGNOSIS — M8580 Other specified disorders of bone density and structure, unspecified site: Secondary | ICD-10-CM

## 2019-05-05 ENCOUNTER — Other Ambulatory Visit: Payer: Self-pay

## 2019-05-05 ENCOUNTER — Inpatient Hospital Stay: Payer: Medicare HMO | Attending: Gynecologic Oncology

## 2019-05-05 DIAGNOSIS — C5701 Malignant neoplasm of right fallopian tube: Secondary | ICD-10-CM | POA: Diagnosis not present

## 2019-05-05 DIAGNOSIS — Z452 Encounter for adjustment and management of vascular access device: Secondary | ICD-10-CM | POA: Insufficient documentation

## 2019-05-05 DIAGNOSIS — Z95828 Presence of other vascular implants and grafts: Secondary | ICD-10-CM

## 2019-05-05 MED ORDER — HEPARIN SOD (PORK) LOCK FLUSH 100 UNIT/ML IV SOLN
500.0000 [IU] | Freq: Once | INTRAVENOUS | Status: AC
  Administered 2019-05-05: 500 [IU] via INTRAVENOUS
  Filled 2019-05-05: qty 5

## 2019-05-05 MED ORDER — SODIUM CHLORIDE 0.9% FLUSH
10.0000 mL | INTRAVENOUS | Status: DC | PRN
  Administered 2019-05-05: 10 mL via INTRAVENOUS
  Filled 2019-05-05: qty 10

## 2019-05-11 ENCOUNTER — Ambulatory Visit: Payer: Medicare HMO | Admitting: Neurology

## 2019-05-15 ENCOUNTER — Encounter: Payer: Self-pay | Admitting: Neurology

## 2019-05-15 DIAGNOSIS — I10 Essential (primary) hypertension: Secondary | ICD-10-CM | POA: Diagnosis not present

## 2019-05-15 DIAGNOSIS — E559 Vitamin D deficiency, unspecified: Secondary | ICD-10-CM | POA: Diagnosis not present

## 2019-05-15 DIAGNOSIS — M85851 Other specified disorders of bone density and structure, right thigh: Secondary | ICD-10-CM | POA: Diagnosis not present

## 2019-05-15 DIAGNOSIS — C5700 Malignant neoplasm of unspecified fallopian tube: Secondary | ICD-10-CM | POA: Diagnosis not present

## 2019-05-15 DIAGNOSIS — R7309 Other abnormal glucose: Secondary | ICD-10-CM | POA: Diagnosis not present

## 2019-05-15 DIAGNOSIS — E782 Mixed hyperlipidemia: Secondary | ICD-10-CM | POA: Diagnosis not present

## 2019-05-15 DIAGNOSIS — E1159 Type 2 diabetes mellitus with other circulatory complications: Secondary | ICD-10-CM | POA: Diagnosis not present

## 2019-05-15 DIAGNOSIS — R945 Abnormal results of liver function studies: Secondary | ICD-10-CM | POA: Diagnosis not present

## 2019-05-15 DIAGNOSIS — R1084 Generalized abdominal pain: Secondary | ICD-10-CM | POA: Diagnosis not present

## 2019-05-16 ENCOUNTER — Telehealth: Payer: Self-pay | Admitting: *Deleted

## 2019-05-16 DIAGNOSIS — C5700 Malignant neoplasm of unspecified fallopian tube: Secondary | ICD-10-CM

## 2019-05-16 NOTE — Telephone Encounter (Signed)
Called and scheduled the patient for a lab and MD visit

## 2019-05-20 ENCOUNTER — Other Ambulatory Visit: Payer: Self-pay | Admitting: Neurology

## 2019-05-20 DIAGNOSIS — G4733 Obstructive sleep apnea (adult) (pediatric): Secondary | ICD-10-CM

## 2019-05-20 DIAGNOSIS — G4719 Other hypersomnia: Secondary | ICD-10-CM

## 2019-05-20 DIAGNOSIS — F5102 Adjustment insomnia: Secondary | ICD-10-CM

## 2019-05-23 ENCOUNTER — Encounter: Payer: Self-pay | Admitting: Neurology

## 2019-05-23 ENCOUNTER — Other Ambulatory Visit: Payer: Self-pay

## 2019-05-23 ENCOUNTER — Ambulatory Visit: Payer: Medicare HMO | Admitting: Neurology

## 2019-05-23 VITALS — BP 152/86 | HR 93 | Temp 98.2°F | Ht 60.0 in | Wt 182.0 lb

## 2019-05-23 DIAGNOSIS — G4733 Obstructive sleep apnea (adult) (pediatric): Secondary | ICD-10-CM | POA: Diagnosis not present

## 2019-05-23 DIAGNOSIS — F5102 Adjustment insomnia: Secondary | ICD-10-CM | POA: Diagnosis not present

## 2019-05-23 DIAGNOSIS — R530 Neoplastic (malignant) related fatigue: Secondary | ICD-10-CM

## 2019-05-23 DIAGNOSIS — R635 Abnormal weight gain: Secondary | ICD-10-CM

## 2019-05-23 DIAGNOSIS — Z9989 Dependence on other enabling machines and devices: Secondary | ICD-10-CM | POA: Diagnosis not present

## 2019-05-23 DIAGNOSIS — G4719 Other hypersomnia: Secondary | ICD-10-CM | POA: Diagnosis not present

## 2019-05-23 MED ORDER — SUNOSI 150 MG PO TABS: 150.0000 mg | ORAL_TABLET | ORAL | 3 refills | Status: DC

## 2019-05-23 NOTE — Progress Notes (Addendum)
Office Visit  Note: Gyn-Onc   Kristin Webb 73 y.o. female  CC:  Chief Complaint  Patient presents with  . Carcinoma of right fallopian tube [facility])     Assessment/Plan:  This is a 73 y.o. 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  CA-125 was drawn today and pending.  I will plan to follow-up with the patient with these results.  As long as her CA-125 continues to be normal, we will not pursue imaging unless symptoms change.  We discussed transitioning to yearly follow-up given that she is more than 5 years out now.  We agreed that we would do 1 more 56-monthinterval follow-up and then move to 1 year if CA-125 continues to be normal and she is asymptomatic.  15 minutes of time was spent with the patient with >50% spent in face to face counseling.  KLafonda Mosses MD., PhD. 05/24/2019, 10:08 AM   HPI: Ms. 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  In January 2019, she was noted to have an elevation in her CA-125.  Subsequent imaging was concerning for disease recurrence with mesenteric nodules.  Since that time, her Ca1 25 has normalized again and subsequent imaging has showed regression in the size of these lesions.  In March 2019, she underwent a CT-guided biopsy of her rectus abdominis which was negative for evidence of malignancy.  Interval History: In mid March, the patient underwent repair of her epigastric/incisional hernia with mesh placement.  She is overall doing very well from that surgery.  She continues to have chronic fatigue although denies any change to her energy level.  She is trying to stay active and has lost some weight recently.  She denies any abdominal  pain or bloating.  She denies vaginal discharge or bleeding.  She continues to struggle with diarrhea which has been a chronic issue.  She reports normal urinary function.  She currently is having allergies affecting her eyes secondary to housework that she is having done.   Oncology History Overview Note  Oncologic Summary: 1. 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 Random CA 125 elevation January 2019  Question mesenteric nodules and anterior abdominal wall nodule 3. [facility] Breast/Ovary Panel negative (including BRCA, MMR's, RAD51 etc) ? [facility] BRACAnalysis  Negative for BRCA1/2 in tumor   Fallopian tube carcinoma ([facility]  11/01/2012 Imaging   Ct abdomen 1.  Interval development of large mid abdominal mass highly concerning for right ovarian cancer.  There is mild omental nodularity on the left, and peritoneal disease cannot be completely excluded.  There is no ascites or other evidence of metastatic disease. 2.  Mild associated renal pelvocaliectasis bilaterally without obstruction.  Nonobstructing left renal calculus and a small right renal angiomyolipoma noted incidentally.   11/08/2012 Pathology Results   1. 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 - BENIGN FALLOPIAN TUBE WITH NONSPECIFIC CHRONIC INFLAMMATION. 2. Ovary and fallopian tube, left -  BENIGN OVARY; NEGATIVE FOR ATYPIA OR MALIGNANCY. - BENIGN FALLOPIAN TUBE; NEGATIVE FOR ATYPIA OR MALIGNANCY. 3. Omentum, resection for tumor - HIGH GRADE CARCINOMA, SEE COMMENT. 4. Appendix, Other than Incidental - FIBROUS OBLITERATION OF  APPENDICEAL TIP. - NEGATIVE FOR MALIGNANCY.   11/08/2012 Surgery   Surgery: Exploratory laparotomy, bilateral salpingo-oophorectomy, appendectomy, infacolic omentectomy, optimal debulking  Surgeons:  Paola A. Alycia Rossetti, MD; Lahoma Crocker, MD   Assistant: Caswell Corwin  Pathology: Bilateral fallopian tubes and ovaries to pathology. Appendix as well as omentum. 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  Operative findings: 25 cm right adnexal mass with smooth surface. Surgically absent uterus. Atrophic-appearing left ovary. Normal appearing appendix. Within the omentum there were centimeter nodules scattered throughout the omentum. The remainder of the surfaces were benign.   12/08/2012 Procedure   Impression:  Placement of a subcutaneous port device.  The catheter tip is in the lower SVC and ready to be used.    12/13/2012 - 03/28/2013 Chemotherapy   s/p 6 cycles of paclitaxel and carboplatin   12/13/2012 - 03/28/2013 Chemotherapy   The patient had 6 cycles of carboplatin and Taxol   03/24/2013 Imaging   US abdomen   04/24/2013 Imaging   CT abdomen Interval resection of the large right pelvic and lower abdominal mass lesion with apparent omentectomy.  No evidence for intraperitoneal free fluid on today's study.  No discernible peritoneal lesions.  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   09/07/2013 Genetic Testing   Patient has genetic testing done for BRCA1/2 panel Results revealed patient has no mutation(s):   07/09/2015 Imaging   CT abdomen 1. 12 mm obstructive calculus at the left ureteropelvic junction with moderate proximal hydronephrosis. 2. 2 small supraumbilical ventral hernias, one containing a short segment of the mid transverse colon and the other containing a short segment of the mid small bowel. There is no associated evidence to suggest bowel incarceration or obstruction at this  time. 3. Tiny locule of gas non dependently in the lumen of the urinary bladder. This is presumably iatrogenic related to recent catheterization for urinalysis. Alternatively, this could be seen in the setting of urinary tract infection with gas-forming organisms. Clinical correlation for history of recent catheterization is recommended. 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 5. Status post cholecystectomy. 6. Additional incidental findings, as above.    12/11/2016 Imaging   Ct abdomen 1. No evidence of metastatic ovarian cancer. 2. Recurrent subxiphoid ventral abdominal wall hernia containing transverse colon. No evidence of incarceration or obstruction. 3. Stable incidental findings in the liver and kidneys. No recurrent urinary tract calculus. 4. Progressive lower lumbar spondylosis. 5.  Aortic Atherosclerosis (ICD10-I70.0).    08/30/2017 Imaging   MRI thoracic spine 1. At T5-6 there is a small central disc protrusion contacting the ventral thoracic spinal cord. No central canal or foraminal stenosis. 2. At T9-10 there is a small right paracentral disc protrusion. 3.  No acute osseous injury of the thoracic spine. 4. No aggressive osseous lesion to suggest metastatic disease.   09/24/2017 Tumor Marker   Patient's tumor was tested for the following markers: CA-125 Results of the tumor marker test revealed 21.7   09/30/2017 Imaging   CT abdomen 1. New small clustered soft tissue nodules in the left lower quadrant in the sigmoid mesentery, largest 1.0 cm, which could represent recurrent peritoneal tumor implants. No ascites.  2. Midline high ventral abdominal wall  hernia containing a portion of the transverse colon is mildly increased in size, and without bowel complication at this time. 3. Chronic findings include: Aortic Atherosclerosis (ICD10-I70.0). Diffuse hepatic steatosis. 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 Small right renal  angiomyolipoma.   10/11/2017 PET scan   1. Nodules in the sigmoid mesentery are hypermetabolic and highly worrisome for metastatic disease. 2. Attic steatosis.    11/03/2017 Procedure   Successful CT-guided rectus abdominal muscle mass core biopsy. Path: - FOREIGN BODY GIANT CELL REACTION INVOLVING FIBROADIPOSE TISSUE AND SKELETAL MUSCLE. - NO EVIDENCE OF MALIGNANCY.   11/04/2017 Cancer Staging   Staging form: Fallopian Tube, AJCC 7th Edition - Clinical: FIGO Stage IIIC, calculated as Stage III (T3, N0, M0) - Signed by [name], MD on 11/04/2017     Social Hx:   Social History   Socioeconomic History  . Marital status: Widowed    Spouse name: [name]  . Number of children: 2  . Years of education: Master's  . Highest education level: Not on file  Occupational History  . Not on file  Social Needs  . Financial resource strain: Not on file  . Food insecurity    Worry: Not on file    Inability: Not on file  . Transportation needs    Medical: Not on file    Non-medical: Not on file  Tobacco Use  . Smoking status: Never Smoker  . Smokeless tobacco: Never Used  Substance and Sexual Activity  . Alcohol use: No  . Drug use: No  . Sexual activity: Not on file  Lifestyle  . Physical activity    Days per week: Not on file    Minutes per session: Not on file  . Stress: Not on file  Relationships  . Social Herbalist on phone: Not on file    Gets together: Not on file    Attends religious service: Not on file    Active member of club or organization: Not on file    Attends meetings of clubs or organizations: Not on file    Relationship status: Not on file  . Intimate partner violence    Fear of current or ex partner: Not on file    Emotionally abused: Not on file    Physically abused: Not on file    Forced sexual activity: Not on file  Other Topics Concern  . Not on file  Social History Narrative   Patient is married [name]) and lives at home with her  husband.   Patient has 2 children by birth and 13 children all together.   Patient has a Oceanographer.   Patient is right-handed.   Patient drinks very little caffeine.          Past Surgical Hx:  Past Surgical History:  Procedure Laterality Date  . ABDOMINAL HYSTERECTOMY     early 75s  . APPENDECTOMY     WITH DEBULKING/BSO  . CHOLECYSTECTOMY     early 30s  . CYSTOSCOPY W/ URETERAL STENT PLACEMENT Left 07/09/2015   DUE TO NEPHROLITHIASIS Procedure: CYSTOSCOPY WITH LEFT RETROGRADE PYELOGRAM/ LEFT URETERAL STENT PLACEMENT;  Surgeon: Ardis Hughs, MD;  Location: [facility];  Service: Urology;  Laterality: Left;  . HERNIA REPAIR    . INCISIONAL HERNIA REPAIR N/A 12/25/2015   WITH MESH Procedure:  INCISIONAL HERNIA REPAIR;  Surgeon: Ralene Ok, MD;  Location: [facility];  Service: General;  Laterality: N/A;  . INCISIONAL HERNIA REPAIR N/A 10/21/2018  Procedure: LAPAROSCOPIC INCISIONAL HERNIA REPAIR WITH MESH;  Surgeon: Ralene Ok, MD;  Location: [facility];  Service: General;  Laterality: N/A;  . INSERTION OF MESH N/A 12/25/2015   Procedure: INSERTION OF MESH;  Surgeon: Ralene Ok, MD;  Location: [facility];  Service: General;  Laterality: N/A;  . LAPAROTOMY Bilateral 11/08/2012   Procedure: EXPLORATORY LAPAROTOMY BILATERAL SALPINGO OOPHORECTOMY TUMOR DEBULKING ;  Surgeon: Imagene Gurney A. Alycia Rossetti, MD;  Location: [facility];  Service: Gynecology;  Laterality: Bilateral;  APPENDECTOMY / OMENTECTOMY  . LAPAROTOMY N/A 12/25/2015   Procedure: EXPLORATORY LAPAROTOMY;  Surgeon: Ralene Ok, MD;  Location: [facility];  Service: General;  Laterality: N/A;  . LYSIS OF ADHESION N/A 12/25/2015   Procedure: LYSIS OF ADHESION;  Surgeon: Ralene Ok, MD;  Location: [facility];  Service: General;  Laterality: N/A;  . PARTIAL HYSTERECTOMY  2006  . TRIGGER FINGER RELEASE      Past Medical Hx:  Past Medical History:  Diagnosis Date  . Burning mouth syndrome   . Chronic fatigue   . Constipation   . Diabetes  ([place]) 05/09/2018  . Diarrhea    in the past after gallbladder removal  . Fibromyalgia   . GERD (gastroesophageal reflux disease)   . History of kidney stones 07/2015  . Hyperlipidemia   . Hypertension    borderline not on meds   . Insomnia secondary to depression with anxiety   . Lumbar disc disease   . Ovarian cancer ([place]) 2014  . Pelvic mass in female   . Pneumonia    hx of pneumonia as an infant  . PONV (postoperative nausea and vomiting)    pain from gas hernia 2017  . Shortness of breath    with exertion   . Sleep apnea    uses CPAP  . Yeast infection    Family Hx:  Family History  Problem Relation Age of Onset  . Lung cancer Mother 75  . High blood pressure Mother   . High Cholesterol Mother   . Lung cancer Father 67       2 ppd smoker  . Parkinson's disease Father   . Kidney disease Other     Review of Systems:  Constitutional  Feels well, + chronic fatigue Skin No rash, sores, jaundice, itching, dryness,  Cardiovascular  No chest pain, shortness of breath, or edema  Pulmonary  No cough or wheeze.  Gastro Intestinal  No nausea, vomitting, + chronic diarrhea. No bright red blood per rectum, no abdominal pain, change in bowel movement, or constipation.  Genito Urinary  No frequency, urgency, dysuria,  Musculo Skeletal  No myalgia, arthralgia, joint swelling or pain  Neurologic  No weakness, numbness, change in gait,  Psychology  No depression, anxiety, insomnia.     Vitals:  Blood pressure 130/88, pulse 78, temperature 97.8 F (36.6 C), temperature source Tympanic, height _0  (1.575 m), weight 182 lb (82.6 kg).  Physical Exam: Blood pressure 130/88, pulse 78, temperature 97.8 F (36.6 C), temperature source Tympanic, height _1  (1.575 m), weight 182 lb (82.6 kg). Body mass index is 33.29 kg/m. General NAD HEENT Drainage noted from bilateral eyes. Neck  Supple without any enlargements.  Lymph node survey. No cervical supraclavicular  cervical or inguinal adenopathy Cardiovascular  Pulse normal rate, regularity and rhythm. S1 and S2 normal. Lungs  Clear to auscultation bilateraly, without wheezes/crackles/rhonchi. Good air movement.  Skin  No rash/lesions/breakdown  Psychiatry  Alert and oriented to person, place, and time  Back No CVA tenderness  Abdomen  Normoactive bowel sounds, abdomen soft, non-tender and obese. Surgical  sites intact without evidence of hernia.  Genito Urinary  Vulva/vagina: Normal external female genitalia.  No lesions.   Bladder/urethra:  No lesions or masses  Vagina: Moderately atrophic mucosa, no lesions or masses noted.  Findings formed on bimanual exam. Rectal  Good tone, no masses no cul de sac nodularity.  Extremities  No bilateral cyanosis, clubbing or edema.   Imaging CT 09/08/18 EXAM: CT ABDOMEN AND PELVIS WITH CONTRAST  TECHNIQUE: Multidetector CT imaging of the abdomen and pelvis was performed using the standard protocol following bolus administration of intravenous contrast.  CONTRAST:  140m OMNIPAQUE IOHEXOL 300 MG/ML SOLN, 367mOMNIPAQUE IOHEXOL 300 MG/ML SOLN  COMPARISON:  None.  FINDINGS: Lower chest: Lung bases are clear.  Hepatobiliary: No focal hepatic lesion. Postcholecystectomy. No biliary dilatation.  Pancreas: Pancreas is normal. No ductal dilatation. No pancreatic inflammation.  Spleen: Normal spleen  Adrenals/urinary tract: Adrenal glands normal. Kidneys, ureters and bladder normal.  Stomach/Bowel: Stomach, duodenum small-bowel and cecum normal. Ascending colon normal. The transverse colon enters a large ventral hernia. The hernia mouth measures 6.4 cm mild hernia sac measures 68.2 cm. Approximately 8 cm segment transverse colon fills the hernia sac. No evidence of high-grade obstruction however contrast does not pass more distal to the hernia sac. Descending colon and rectosigmoid colon normal.  Small lymph nodes in the sigmoid mesocolon  decreased in size. For example 8 mm node on image 71/2 decreased from 12 mm on prior. Small 4 mm node (image 67/2) decreased from 10 mm.  No new adenopathy in the mesocolon.  Vascular/Lymphatic: Abdominal aorta is normal caliber. No periportal or retroperitoneal adenopathy. No pelvic adenopathy.  Reproductive: Post hysterectomy anatomy  Other: No free fluid.  Musculoskeletal: No aggressive osseous lesion.  IMPRESSION: 1. Nodules within the sigmoid mesocolon have decreased in size compared to prior. No new peritoneal or omental nodularity. 2. No evidence local recurrence the pelvis. 3. Ventral hernia contains a segment of transverse colon. No change from prior.  Labs  Ref Range & Units 46m81moo (08/23/18) 16m77mo (05/25/18) 249yr 19yr(02/22/18) 249yr a39yr4/15/19) 249yr ag77yr/15/19)  Cancer Antigen (CA) 125 0.0 - 38.1 U/mL 9.5  15.2 CM  14.1 CM  10.1 CM  27.1 CM   Comment: (NOTE)     Ref Range & Units 249yr ago549yr16/19) 38yr ago 9yr8/18) 38yr ago (67yr/18) 57yr ago ([id number]) 57yr ago (6/59yr7)  Cancer Antigen (CA) 125 0.0 - 38.1 U/mL 20.3  9.3 CM  8.0 CM  7.8 CM  11.8 CM   Comment: (NOTE)     Ref Range & Units 57yr ago (4/78yr 57yr ago (2/15849yr 57yr ago (12/1680yr 57yr ago (10/21/449yr49yr ago (03/13/15)638yr 125 <35 U/mL 7  6 CM  6 CM  7 CM  5 CM   Comment:      Ref Range & Units 49yr ago (12/18/14)38yr ago (09/06/14) 657yrago (09/06/14) 41034yrgo (06/04/14) 420yrgo (06/04/14)  C29yr5 <35 U/mL 5  4.6 R, CM  6 CM  4.4 R, CM  5 CM     73yr ago (11/06/13) 73yr 734yr(09/07/13) 73yr a88yr10/27/14) 35yr a349yr9/8/14) 34yr ago 549yr/14)   CA 125 534yr- 30.2 U/mL 3.8  3.1  5.1  4.7  5.0     Ref Range & Units 34yr ago (01/18/13) 34yr ago (9yr/14) 34yr ago (5649yr4)  CA 125 0.0 538yr.2 U/mL 4.9  5.3  17.7

## 2019-05-23 NOTE — Progress Notes (Signed)
[facility]  Provider:  Asencion Partridge  Leny Morozov,M.D.   Referring Provider: Cari Caraway, MD at [facility].   Primary Care Physician: Nancy Marus, MD [facility]. Standley Dakins, MD at [facility].  -  Review sleepiness, CPAP compliance.    HPI:  Kristin Webb is a 73 y.o. female patient with a history of OSA on CPAP -and persistent fatigue, is seen here in a revisit for CPAP follow up/ compliance visit.  She is a Glass blower/designer, has a large social circle. She is living alone with her rescue dog, and is optimistic and happy.  Rv 05-23-2019-I have the pleasure of looking at the data download for Kristin Webb encompassing 30 days before 21 May 2019.  She has been 100% compliant with an average usage time of 8 hours 90 minutes, minimum pressure is 5 maximum pressure is applied is 15 cmH2O with 1 cm expiratory pressure relief.  Residual AHI is 1.4 which is an excellent resolution she has minimal air leaks her 95th percentile pressure is 14.9cm  this may be related to some weight gain. She has used melatonin, benadryl.     Kristin Webb is seen here on 09 May 2018, she has been a highly compliant CPAP user, her downloaded data however have to be combined from 2 different machines.  For general use at home she has an air sense 10 AutoSet machine and then she has a travel CPAP as well.  The average user time on days used is 8 hours and 59 minutes, she has used CPAP in one form or another for 97% of the recorded time, she is using AutoSet 5-15 cmH2O was 1 cm EPR level the 95th percentile pressure required is 14.5 which straddles the upper limit.  Residual AHI is 0.8 which is excellent and there are no major air leaks.  She has gained weight since I saw her last and her BMI rose by over 4 points this may explain why she needs a higher setting at this time.  Her Epworth Sleepiness Scale was endorsed at 13 out of 24 possible points and her fatigue severity at 44 out of 63  possible points, the geriatric depression scale was endorsed at 1 out of 15 points.    Interval history from 05/05/2017, Kristin Webb is doing well, she is living by herself with her dog, recently had some home restoration to do. This added a little bit of hectic to her life, however she is doing well and her counselor has not progressed. She is followed by a new oncologist, [name]. She underwent another HST following significant weight gain, AHI was 9.1 /hr. She has many desaturations and was placed on auto CPAP in response to high EDS with mild apnea.  Her compliance to CPAP with excellent at 90% with an average use of 8 hours and 32 minutes, she is using an AutoSet between 5 and 15 cm water was 1 cm EPR and her residual AHI is 1.9. This is excellent resolution of her apnea. The 95th percentile pressure is 14.8, there were no central apneas emerging, there is no need to adjust the machine in any way. She endorsed the Epworth Sleepiness Scale at 9 points, fatigue severity at 44 points and the geriatric depression score is 1 out of 15 points. She continues to participate in a grief group after her husband's death. She started Yoga, Zen knitting and fosters dogs.    12-12-13,Since I have seen her last this patient was diagnosed with ovarian  cancer, underwent surgery and chemotherapy. She currently struggles with a weak  abdominal and core musculatur  and it feels to her she had an abdominal hernia. She has tried a corsett , but this has aggravated her bulging discs in her spine.  The patient was originally referred by Dr. Sheryn Bison at the time with fatigue , pain, excessive sleepiness and burning mouth syndrome. In 2010 she developed transiently left mouth droop, and a TIA/ CVA work up was negative. EMG and nerve conduction studies were unrevealing , a rheumatology consult,  pain specialist , neurosurgical evaluation were unrevealing .  Labs revealed a vitamin D deficiency,  normal TSH and CMET, CBC.   Her neurosurgery consult for back pain was without results.  She was referred to me for a sleep consultation in 2009. The patient was diagnosed with sleep apnea on 03-07-08 with an AHI of 24.3 and in REM AHI of 82. Her BMI at the time was 37.9 , she was titrated to 10 cm water pressure on CPAP  which relieved her AHI but she still snored.  Kristin Webb's newest  sleep study dated 11-26-13 whowed  residual apnea. The study was therefore a normal polysomnography and not split into a titration parts. The AHI was 0.9, the RDI was 2.4 and the oxygen nadir was 87% time and his saturation was 33.6 minutes at or below 90% saturation of oxygen. Heart which was regular, normal sinus rhythm prevailed. She still sleeps better with Lunesta 2 mg could not get a good result from 1 mg pills. Fragmentation of sleep, alpha intrusion can be seen in chronic pain and fatigue.She is not a coffee drinker, there is no caffeine to be cut out. There is no pain in sleep. Sleep psychology referral is problematic with [facility]. Sleep hygiene was discussed.   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 She has compliantly  used a  Original machine,  New the CPAP  machine seems to have failed. I am unable to obtain any data. In addition the patient had remained excessively fatigued and daytime sleepy. Possibly , this could have been a paraneoplastic manifestation of ovarian malignancy. She has been using Nuvigil as tolerated which has given her improvement in life quality and energy.   The last CPAP  download was on 05-06-12 with the 95% percentile pressure of 12 cm water, the residual AHI of 0.5 and an average of 5.9 hours of night-user time for CPAP.  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 . Kristin Webb since being diagnosed with ovarian cancer has underwent a lot of  emotional changes as well. Her oncologist, Dr. Marko Plume, feels that some of the nausea is GERD-   So far since last week has not been a palpable results. The patient has also tried to wean off her sleep aids but became panicked and insomniac again.Mr. Surace had been admitted to the hospital after a spot on his lung was incidentally found , while being evaluated for kidney stones. He went into acute respiratory distress was placed on oxygen oxygen and readmitted to the hospital but never recovered. And during that time she needed to return to her previous insomnia regimen. She is under financial distress, had a lot of difficulties since her husbands death.  CD Interval history from 05/18/2016, Mrs. Enser has been able to cut down on her Lunesta dose from 2 mg to 1 mg and  uses if necessary Nuvigil to drive. She has slept usually uninterrupted as of last summer also thinks have changed again in 2017 now she begun in August to wake up 3-4 times a week in AM with a headache, very unusual for her. Nausea is associated with these headaches, no photophobia or phonophobia is reported. And as of August she has needed and used Nuvigil 125 g daily again. Not waking up in the night, told that she snores by a roommate during a church trip. Her fatigue is reminiscent of the times when she had suffered from sleep apnea.   CD Interval history from 07/15/2016, Mrs. Gaiser is here to see the results of her recent home sleep test, performed on 06/10/2016. Her AHI was 2.2 her RDI was 5 per hour there was no significant sleep apnea noted no oxygen desaturation was found, and she did not have tachycardia or bradycardia arrhythmia. Her fatigue has remained her Epworth sleepiness score has actually increased to 12 point from 10 during our last visit. She states that she often only gets up in the morning before because she has to let the dog out but then feels that she needs another hour of sleep afterwards. She also suffers  from dry mouth and dry eyes. There is a possible autoimmune component, and she does suffer from fibromyalgia.  She does not endorse a significant depression score since she lost her husband she has been grieving but she has not "fallen into a deep hole". She sleeps with the help of Lunesta 1 mg and Benadryl, she has failed Ambien (which caused her to sleep walk). She was also significantly more drowsy in daytime. She could neither tolerate Ambien 10 not 5 mg.    Review of Systems: Out of a complete 14 system review, the patient complains of only the following symptoms, and all other reviewed systems are negative. Fatigue , EDS, abdominal distention, dry mouth. Dry eyes, chemotherapy related ?Marland Kitchen  She still uses Lunesta 1 mg prn  for insomnia, no evidence of edema, or rash. Dry mouth, dry eyes, leg cramping.  BMI  Reduced back to 35.  Compliant user of CPAP - she has 2 machines, one for travels.  During COVID there have been no travels. 1 mg of Lunesta and 25 mg of Benadryl at night.  She eats earlier, takes snakes which helps her nausea- fatigue and sleepiness.   She would like to have nuvigil available.  How likely are you to doze in the following situations: 0 = not likely, 1 = slight chance, 2 = moderate chance, 3 = high chance  Sitting and Reading? Watching Television? Sitting inactive in a public place (theater or meeting)? Lying down in the afternoon when circumstances permit? Sitting and talking to someone? Sitting quietly after lunch without alcohol? In a car, while stopped for a few minutes in traffic? As a passenger in a car for an hour without a break?  Total = 12/ 24  FSS at 50 out of 63 points.       Social History   Socioeconomic History  . Marital status: Widowed    Spouse name: [name]  . Number of children: 2  . Years of education: Master's  . Highest education level: Not on file  Occupational History  . Not on file  Social Needs  . Financial resource  strain: Not on file  . Food insecurity    Worry: Not on file    Inability: Not on file  . Transportation needs  Medical: Not on file    Non-medical: Not on file  Tobacco Use  . Smoking status: Never Smoker  . Smokeless tobacco: Never Used  Substance and Sexual Activity  . Alcohol use: No  . Drug use: No  . Sexual activity: Not on file  Lifestyle  . Physical activity    Days per week: Not on file    Minutes per session: Not on file  . Stress: Not on file  Relationships  . Social Herbalist on phone: Not on file    Gets together: Not on file    Attends religious service: Not on file    Active member of club or organization: Not on file    Attends meetings of clubs or organizations: Not on file    Relationship status: Not on file  . Intimate partner violence    Fear of current or ex partner: Not on file    Emotionally abused: Not on file    Physically abused: Not on file    Forced sexual activity: Not on file  Other Topics Concern  . Not on file  Social History Narrative   Patient is married [name]) and lives at home with her husband.   Patient has 2 children by birth and 13 children all together.   Patient has a Oceanographer.   Patient is right-handed.   Patient drinks very little caffeine.          Family History  Problem Relation Age of Onset  . Lung cancer Mother 69  . High blood pressure Mother   . High Cholesterol Mother   . Lung cancer Father 38       2 ppd smoker  . Parkinson's disease Father   . Kidney disease Other     Past Medical History:  Diagnosis Date  . Burning mouth syndrome   . Chronic fatigue   . Constipation   . Diabetes ([place]) 05/09/2018  . Diarrhea    in the past after gallbladder removal  . Fibromyalgia   . GERD (gastroesophageal reflux disease)   . History of kidney stones 07/2015  . Hyperlipidemia   . Hypertension    borderline not on meds   . Insomnia secondary to depression with anxiety   . Lumbar disc disease   .  Ovarian cancer ([place]) 2014  . Pelvic mass in female   . Pneumonia    hx of pneumonia as an infant  . PONV (postoperative nausea and vomiting)    pain from gas hernia 2017  . Shortness of breath    with exertion   . Sleep apnea    uses CPAP  . Yeast infection     Past Surgical History:  Procedure Laterality Date  . ABDOMINAL HYSTERECTOMY     early 18s  . APPENDECTOMY     WITH DEBULKING/BSO  . CHOLECYSTECTOMY     early 82s  . CYSTOSCOPY W/ URETERAL STENT PLACEMENT Left 07/09/2015   DUE TO NEPHROLITHIASIS Procedure: CYSTOSCOPY WITH LEFT RETROGRADE PYELOGRAM/ LEFT URETERAL STENT PLACEMENT;  Surgeon: Ardis Hughs, MD;  Location: [facility];  Service: Urology;  Laterality: Left;  . HERNIA REPAIR    . INCISIONAL HERNIA REPAIR N/A 12/25/2015   WITH MESH Procedure:  INCISIONAL HERNIA REPAIR;  Surgeon: Ralene Ok, MD;  Location: [facility];  Service: General;  Laterality: N/A;  . INCISIONAL HERNIA REPAIR N/A 10/21/2018   Procedure: LAPAROSCOPIC INCISIONAL HERNIA REPAIR WITH MESH;  Surgeon: Ralene Ok, MD;  Location: [place];  Service: General;  Laterality: N/A;  . INSERTION OF MESH N/A 12/25/2015   Procedure: INSERTION OF MESH;  Surgeon: Ralene Ok, MD;  Location: [facility];  Service: General;  Laterality: N/A;  . LAPAROTOMY Bilateral 11/08/2012   Procedure: EXPLORATORY LAPAROTOMY BILATERAL SALPINGO OOPHORECTOMY TUMOR DEBULKING ;  Surgeon: Imagene Gurney A. Alycia Rossetti, MD;  Location: [facility];  Service: Gynecology;  Laterality: Bilateral;  APPENDECTOMY / OMENTECTOMY  . LAPAROTOMY N/A 12/25/2015   Procedure: EXPLORATORY LAPAROTOMY;  Surgeon: Ralene Ok, MD;  Location: [facility];  Service: General;  Laterality: N/A;  . LYSIS OF ADHESION N/A 12/25/2015   Procedure: LYSIS OF ADHESION;  Surgeon: Ralene Ok, MD;  Location: [facility];  Service: General;  Laterality: N/A;  . PARTIAL HYSTERECTOMY  2006  . TRIGGER FINGER RELEASE      Current Outpatient Medications  Medication Sig Dispense Refill  .  Armodafinil 250 MG tablet TAKE 1 TABLET BY MOUTH EVERY DAY 30 tablet 1  . Carboxymethylcellulose Sodium (EYE DROPS OP) Apply 2 drops to eye daily as needed (dry eye).    . cholecalciferol (VITAMIN D3) 25 MCG (1000 UT) tablet Take 1,000 Units by mouth daily.    . colestipol (COLESTID) 1 g tablet Take 0.5-1 g by mouth daily. Pt taking 1/2 to 1 tablet daily depending on meal choice    . EPINEPHrine (EPIPEN 2-PAK) 0.3 mg/0.3 mL DEVI Inject 0.3 mg into the muscle once as needed (For bee stings.). Reported on 01/20/2016    . eszopiclone (LUNESTA) 2 MG TABS tablet TAKE ONE-HALF TO ONE TABLET BY MOUTH EVERY NIGHT AT BEDTIME 90 tablet 0  . FLUoxetine (PROZAC) 10 MG capsule Take 10 mg by mouth daily. Takes with a 20mg  capsule for her total dose of 30mg .    . FLUoxetine (PROZAC) 20 MG capsule Take 20 mg by mouth daily. Takes with a 10mg  capsule for her total dose of 30mg .    . ibuprofen (ADVIL,MOTRIN) 200 MG tablet Take 400-600 mg by mouth every 6 (six) hours as needed for mild pain or moderate pain.    Marland Kitchen MELATONIN PO Take 10 mg by mouth at bedtime.     . metFORMIN (GLUCOPHAGE-XR) 500 MG 24 hr tablet Take 500 mg by mouth at bedtime.   3  . Multiple Vitamin (MULTIVITAMIN WITH MINERALS) TABS Take 1 tablet by mouth daily.    . naproxen sodium (ALEVE) 220 MG tablet Take 220 mg by mouth daily as needed (pain).     . rosuvastatin (CRESTOR) 20 MG tablet Take 20 mg by mouth at bedtime.    . triamterene-hydrochlorothiazide (MAXZIDE-25) 37.5-25 MG tablet Take 1 tablet by mouth daily. 30 tablet 1   No current facility-administered medications for this visit.    Facility-Administered Medications Ordered in Other Visits  Medication Dose Route Frequency Provider Last Rate Last Dose  . sodium chloride flush (NS) 0.9 % injection 10 mL  10 mL Intravenous PRN Nancy Marus, MD   10 mL at 05/12/17 0823    Allergies as of 05/23/2019 - Review Complete 05/23/2019  Allergen Reaction Noted  . Ambien [zolpidem tartrate] Other  (See Comments) 12/10/2015  . Bee venom Anaphylaxis and Hives 11/02/2012  . Cortisone Nausea And Vomiting, Swelling, and Other (See Comments) 11/02/2012    Vitals: BP (!) 152/86   Pulse 93   Temp 98.2 F (36.8 C)   Ht 5' (1.524 m)   Wt 182 lb (82.6 kg)   BMI 35.54 kg/m  Last Weight:  Wt Readings from Last 1 Encounters:  05/23/19  182 lb (82.6 kg)   Last Height:   Ht Readings from Last 1 Encounters:  05/23/19 5' (1.524 m)    Physical exam: Regained weight again. Fatigued again.  General: The patient is awake, alert and appears not in acute distress.  She reports fatigue, is well groomed. She has a new abdominal hernia.  Head: Normocephalic,  Neck is supple. Mallampati 3 , neck circumference: 15. Retrognathia. Dry mouth .  Dental  Decay  Cardiovascular: Regular rate and rhythm without  murmurs or carotid bruit, and without distended neck veins. Respiratory: Lungs are clear to auscultation.Skin:  Without evidence of edema, or rash,. Dry mouth, dry eyes, leg cramping. Trunk: BMI further elevated from 35 up to 41 with signifcant abdominal obesity.  Neurologic exam : The patient is awake and alert, oriented to place and time.  Memory subjective  described as intact. There is a normal attention span & concentration ability.  Speech is fluent without  dysarthria, dysphonia or aphasia. Mood and affect are appropriate.  Cranial nerves: Pupils are equal and briskly reactive to light.Visual fields by finger perimetry are intact. Hearing to finger rub intact.  Facial sensation intact to fine touch. Facial motor strength is symmetric, her tongue and uvula move in  midline. Motor exam:  Normal tone ,muscle bulk and symmetric strength in all extremities. Grip strength is improved since 2016.  Gait and station: Patient walks without assistive device , Strength within normal limits.  Steps are unfragmented.  Deep tendon reflexes: in the upper and lower extremities are symmetric and intact.     Assessment:  After physical and neurologic examination, review of laboratory studies, imaging, neurophysiology testing and pre-existing records, assessment is   1) OSA : was considered resolved as of 2016 ( AHI was 0.9).    After HST in 2017 she was again found to have mild OSA -positive AHI was 2.2/h  on 4% scoring, but would be 5.6 on 3% scoring,    Yet patient still was found improved on CPAP- she continues with auto CPAP.  2) depression has lifted- fatigued and grieving - since her husband died. Fatigue improved prn modafinil. Some nights Benadryl.  3)  Ovarian cancer survivor , anxiety and grief. follows with hospice and Cancer center .  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   Plan: Lunesta 1 mg refill. Nuvigil not longer working/ try [place].  Increased CPAP pressure by only one cm water to 16 cm water.  Remained fatigued while not having clinically significant apnea. Has been highly compliant with CPAP use.  Rv in 12 month- we will get the download from auto-titration CPAP, she brings the data from her travel CPAP by smart -phone.   Larey Seat, MD   05-23-2019   Cc Cari Caraway. MD/  Aura Dials, MD

## 2019-05-23 NOTE — Patient Instructions (Signed)
Solriamfetol tablets What is this medicine? SOLRIAMFETOL (sol ri AM fe tol) is used to treat excessive sleepiness caused by certain sleep disorders including narcolepsy and obstructive sleep apnea. This medicine may be used for other purposes; ask your health care provider or pharmacist if you have questions. COMMON BRAND NAME(S): SUNOSI What should I tell my health care provider before I take this medicine? They need to know if you have any of these conditions:  bipolar disorder  diabetes  heart disease  high blood pressure  high cholesterol  history of drug abuse or alcohol abuse problem  history of stroke  kidney disease  schizophrenia  an unusual or allergic reaction to solriamfetol, other medicines, foods, dyes, or preservatives  pregnant or trying to get pregnant  breast-feeding How should I use this medicine? Take this medicine by mouth with a glass of water when you first wake up. Do not take it within 9 hours of your planned bedtime. Follow the directions on the prescription label. You can take it with or without food. If it upsets your stomach, take it with food. Take your medicine at regular intervals. Do not take it more often than directed. Do not stop taking except on your doctor's advice. A special MedGuide will be given to you by the pharmacist with each prescription and refill. Be sure to read this information carefully each time. Talk to your pediatrician regarding the use of this medicine in children. Special care may be needed. Overdosage: If you think you have taken too much of this medicine contact a poison control center or emergency room at once. NOTE: This medicine is only for you. Do not share this medicine with others. What if I miss a dose? If you miss a dose, take it as soon as you can. However, avoid taking it within 9 hours of your planned bedtime, since you may find it harder to go to sleep. If it is almost time for your next dose, take only that  dose. Do not take double or extra doses. What may interact with this medicine? Do not take this medicine with any of the following medications:  MAOIs like Carbex, Eldepryl, Marplan, Nardil, and Parnate This medicine may also interact with the following medications:  certain medicines for Parkinson's disease like levodopa, pramipexole, or ropinirole  medicines that increase blood pressure or heart rate This list may not describe all possible interactions. Give your health care provider a list of all the medicines, herbs, non-prescription drugs, or dietary supplements you use. Also tell them if you smoke, drink alcohol, or use illegal drugs. Some items may interact with your medicine. What should I watch for while using this medicine? Visit your healthcare professional for regular checks on your progress. Tell your healthcare professional if your symptoms do not start to get better or if they get worse. This medicine has a risk of abuse and dependence. Your healthcare provider will check you for this while you take this medicine. What side effects may I notice from receiving this medicine? Side effects that you should report to your doctor or health care professional as soon as possible:  allergic reactions like skin rash, itching, and hives; swelling of the face, lips, or tongue  anxiety  changes in emotions or moods  elevated mood, decreased need for sleep, racing thoughts, impulsive behavior  fast heartbeat  hallucinations, loss of contact with reality  irritable  signs and symptoms of a dangerous increase in blood pressure like chest pain; shortness of breath;   sudden severe headache; vision disturbances; seizures; decreased consciousness  signs and symptoms of a stroke like changes in vision; confusion; trouble speaking or understanding; severe headaches; sudden numbness or weakness of the face, arm or leg; trouble walking; dizziness; loss of balance or  coordination  vomiting Side effects that usually do not require medical attention (report these to your doctor or health care professional if they continue or are bothersome):  decreased appetite  dry mouth  increased sweating  nausea  trouble sleeping This list may not describe all possible side effects. Call your doctor for medical advice about side effects. You may report side effects to FDA at 1-800-FDA-1088. Where should I keep my medicine? Keep out of the reach of children. This medicine can be abused. Keep your medicine in a safe place to protect it from theft. Do not share this medicine with anyone. Selling or giving away this medicine is dangerous and is against the law. Store at room temperature between 15 and 30 degrees C (59 and 86 degrees F). This medicine may cause harm and death if it is taken by other adults, children, or pets. Return medicine that has not been used to an official disposal site. Contact the DEA at 1-800-882-9539 or your city/county government to find a site. If you cannot return the medicine, mix any unused medicine with a substance like cat litter or coffee grounds. Then throw the medicine away in a sealed container like a sealed bag or coffee can with a lid. Do not use the medicine after the expiration date. NOTE: This sheet is a summary. It may not cover all possible information. If you have questions about this medicine, talk to your doctor, pharmacist, or health care provider.  2020 Elsevier/Gold Standard (2018-05-12 12:40:49)  

## 2019-05-24 ENCOUNTER — Encounter: Payer: Self-pay | Admitting: Gynecologic Oncology

## 2019-05-24 ENCOUNTER — Inpatient Hospital Stay: Payer: Medicare HMO | Attending: Gynecologic Oncology | Admitting: Gynecologic Oncology

## 2019-05-24 ENCOUNTER — Inpatient Hospital Stay: Payer: Medicare HMO

## 2019-05-24 ENCOUNTER — Other Ambulatory Visit: Payer: Self-pay | Admitting: Family Medicine

## 2019-05-24 ENCOUNTER — Other Ambulatory Visit: Payer: Self-pay

## 2019-05-24 VITALS — BP 130/88 | HR 78 | Temp 97.8°F | Ht 62.0 in | Wt 182.0 lb

## 2019-05-24 DIAGNOSIS — Z9221 Personal history of antineoplastic chemotherapy: Secondary | ICD-10-CM

## 2019-05-24 DIAGNOSIS — M797 Fibromyalgia: Secondary | ICD-10-CM | POA: Insufficient documentation

## 2019-05-24 DIAGNOSIS — E785 Hyperlipidemia, unspecified: Secondary | ICD-10-CM | POA: Insufficient documentation

## 2019-05-24 DIAGNOSIS — Z90710 Acquired absence of both cervix and uterus: Secondary | ICD-10-CM

## 2019-05-24 DIAGNOSIS — K219 Gastro-esophageal reflux disease without esophagitis: Secondary | ICD-10-CM | POA: Diagnosis not present

## 2019-05-24 DIAGNOSIS — Z90722 Acquired absence of ovaries, bilateral: Secondary | ICD-10-CM | POA: Diagnosis not present

## 2019-05-24 DIAGNOSIS — R5382 Chronic fatigue, unspecified: Secondary | ICD-10-CM | POA: Diagnosis not present

## 2019-05-24 DIAGNOSIS — C5701 Malignant neoplasm of right fallopian tube: Secondary | ICD-10-CM

## 2019-05-24 DIAGNOSIS — I10 Essential (primary) hypertension: Secondary | ICD-10-CM | POA: Diagnosis not present

## 2019-05-24 DIAGNOSIS — R197 Diarrhea, unspecified: Secondary | ICD-10-CM | POA: Insufficient documentation

## 2019-05-24 DIAGNOSIS — Z1231 Encounter for screening mammogram for malignant neoplasm of breast: Secondary | ICD-10-CM

## 2019-05-24 DIAGNOSIS — C5700 Malignant neoplasm of unspecified fallopian tube: Secondary | ICD-10-CM

## 2019-05-24 DIAGNOSIS — E119 Type 2 diabetes mellitus without complications: Secondary | ICD-10-CM | POA: Diagnosis not present

## 2019-05-24 MED ORDER — SODIUM CHLORIDE 0.9% FLUSH
10.0000 mL | INTRAVENOUS | Status: DC | PRN
  Administered 2019-05-24: 10 mL via INTRAVENOUS
  Filled 2019-05-24: qty 10

## 2019-05-24 MED ORDER — HEPARIN SOD (PORK) LOCK FLUSH 100 UNIT/ML IV SOLN
500.0000 [IU] | Freq: Once | INTRAVENOUS | Status: AC | PRN
  Administered 2019-05-24: 500 [IU] via INTRAVENOUS
  Filled 2019-05-24: qty 5

## 2019-05-24 NOTE — Patient Instructions (Signed)

## 2019-05-24 NOTE — Patient Instructions (Signed)
I will release your CA-125 once it results.  We will plan to see you in 6 months. Please call our clinic at [phone] in January to make the appointment for April 2021.  If you have any abdominal pain, bloating, or other concerning symptoms prior to your next visit, please call our clinic.

## 2019-05-25 ENCOUNTER — Encounter: Payer: Self-pay | Admitting: Gynecologic Oncology

## 2019-05-25 ENCOUNTER — Telehealth: Payer: Self-pay | Admitting: Neurology

## 2019-05-25 ENCOUNTER — Other Ambulatory Visit: Payer: Self-pay | Admitting: Neurology

## 2019-05-25 LAB — CA 125: Cancer Antigen (CA) 125: 15.7 U/mL (ref 0.0–38.1)

## 2019-05-25 MED ORDER — ARMODAFINIL 250 MG PO TABS: 250.0000 mg | ORAL_TABLET | Freq: Every day | ORAL | 0 refills | Status: DC

## 2019-05-25 NOTE — Addendum Note (Signed)
Addended by: Darleen Crocker on: 05/25/2019 04:56 PM   Modules accepted: Orders

## 2019-05-25 NOTE — Telephone Encounter (Signed)
[id number] Dr Abigail Butts McNeil's office has called to report that Solriamfetol HCl (SUNOSI) 150 MG TABS is not covered under pt's insurance it is $702.00 RN Graylin Shiver is asking if the script pt has for Nuvigal 20mg  can be filled please call

## 2019-05-25 NOTE — Telephone Encounter (Signed)
I called the RN and advised that we can cancel the [facility] script for the patient. Due to [employer] the [facility] card will not work. She is without the medication I will dc the sunosi and see if the work in will refill a script for her of Armodafinil 250mg  which is what she was on prior to Dr Brett Fairy changing this week.

## 2019-05-26 DIAGNOSIS — E1159 Type 2 diabetes mellitus with other circulatory complications: Secondary | ICD-10-CM | POA: Diagnosis not present

## 2019-05-26 DIAGNOSIS — M159 Polyosteoarthritis, unspecified: Secondary | ICD-10-CM | POA: Diagnosis not present

## 2019-05-26 DIAGNOSIS — E782 Mixed hyperlipidemia: Secondary | ICD-10-CM | POA: Diagnosis not present

## 2019-05-26 DIAGNOSIS — I10 Essential (primary) hypertension: Secondary | ICD-10-CM | POA: Diagnosis not present

## 2019-05-29 ENCOUNTER — Telehealth: Payer: Self-pay | Admitting: Neurology

## 2019-05-29 NOTE — Telephone Encounter (Signed)
Called and spoke with [place]. Informed her that I would send refill request for the patient previous medication as Sunosi is not covered and will not be inexpensive under [employer]. Dr Krista Blue sent the refill for me for the patient for Armodafinil 250 mg and we will dc sunosi due to insurance lack of coverage

## 2019-05-29 NOTE — Telephone Encounter (Signed)
PA completed on cover my meds/[employer].  [id number] Will wait for response

## 2019-06-13 ENCOUNTER — Other Ambulatory Visit: Payer: Self-pay | Admitting: Gynecologic Oncology

## 2019-06-13 ENCOUNTER — Telehealth: Payer: Self-pay

## 2019-06-13 DIAGNOSIS — R194 Change in bowel habit: Secondary | ICD-10-CM

## 2019-06-13 DIAGNOSIS — E1159 Type 2 diabetes mellitus with other circulatory complications: Secondary | ICD-10-CM | POA: Diagnosis not present

## 2019-06-13 DIAGNOSIS — I10 Essential (primary) hypertension: Secondary | ICD-10-CM | POA: Diagnosis not present

## 2019-06-13 DIAGNOSIS — M159 Polyosteoarthritis, unspecified: Secondary | ICD-10-CM | POA: Diagnosis not present

## 2019-06-13 DIAGNOSIS — E782 Mixed hyperlipidemia: Secondary | ICD-10-CM | POA: Diagnosis not present

## 2019-06-13 NOTE — Telephone Encounter (Signed)
Told Ms Joynt that Dr. Berline Lopes wants her to have a CT Abdomen and Pelvis to evaluate for recurrence. Scheduled for 06-19-19 at 2 pm.  Pt needs to be NPO after 1000.  She will arrive at radiology at 1145 am to drink water based contrast. Scheduled a phone visit with Dr. Berline Lopes on 06-20-19 to discuss results. Pt to come in 06-16-19 at 0900 for lab and PAC flush. Pt verbalized understanding.

## 2019-06-13 NOTE — Telephone Encounter (Signed)
Kristin Webb states that for the last 6 weeks she has been experiencing increased abdominal pain.and constipation. She is only having 3-4 BM a day. She has decreased her Colestid 1 gm tablet to 1/4 tab a day. It is painful to pass stool. PCP told her to call the [facility]. Will review information with Dr. Berline Lopes and call her back with recommendations.

## 2019-06-16 ENCOUNTER — Inpatient Hospital Stay: Payer: Medicare HMO | Attending: Gynecologic Oncology

## 2019-06-16 ENCOUNTER — Other Ambulatory Visit: Payer: Self-pay

## 2019-06-16 ENCOUNTER — Inpatient Hospital Stay: Payer: Medicare HMO

## 2019-06-16 DIAGNOSIS — Z452 Encounter for adjustment and management of vascular access device: Secondary | ICD-10-CM | POA: Diagnosis not present

## 2019-06-16 DIAGNOSIS — Z7984 Long term (current) use of oral hypoglycemic drugs: Secondary | ICD-10-CM | POA: Diagnosis not present

## 2019-06-16 DIAGNOSIS — M797 Fibromyalgia: Secondary | ICD-10-CM | POA: Diagnosis not present

## 2019-06-16 DIAGNOSIS — Z791 Long term (current) use of non-steroidal anti-inflammatories (NSAID): Secondary | ICD-10-CM | POA: Diagnosis not present

## 2019-06-16 DIAGNOSIS — K5900 Constipation, unspecified: Secondary | ICD-10-CM | POA: Diagnosis not present

## 2019-06-16 DIAGNOSIS — R110 Nausea: Secondary | ICD-10-CM | POA: Insufficient documentation

## 2019-06-16 DIAGNOSIS — K639 Disease of intestine, unspecified: Secondary | ICD-10-CM | POA: Insufficient documentation

## 2019-06-16 DIAGNOSIS — Z90710 Acquired absence of both cervix and uterus: Secondary | ICD-10-CM | POA: Insufficient documentation

## 2019-06-16 DIAGNOSIS — Z9989 Dependence on other enabling machines and devices: Secondary | ICD-10-CM | POA: Insufficient documentation

## 2019-06-16 DIAGNOSIS — Z90722 Acquired absence of ovaries, bilateral: Secondary | ICD-10-CM | POA: Insufficient documentation

## 2019-06-16 DIAGNOSIS — Z79899 Other long term (current) drug therapy: Secondary | ICD-10-CM | POA: Diagnosis not present

## 2019-06-16 DIAGNOSIS — Z9221 Personal history of antineoplastic chemotherapy: Secondary | ICD-10-CM | POA: Diagnosis not present

## 2019-06-16 DIAGNOSIS — I10 Essential (primary) hypertension: Secondary | ICD-10-CM | POA: Insufficient documentation

## 2019-06-16 DIAGNOSIS — R971 Elevated cancer antigen 125 [CA 125]: Secondary | ICD-10-CM | POA: Diagnosis not present

## 2019-06-16 DIAGNOSIS — K219 Gastro-esophageal reflux disease without esophagitis: Secondary | ICD-10-CM | POA: Insufficient documentation

## 2019-06-16 DIAGNOSIS — E785 Hyperlipidemia, unspecified: Secondary | ICD-10-CM | POA: Insufficient documentation

## 2019-06-16 DIAGNOSIS — E119 Type 2 diabetes mellitus without complications: Secondary | ICD-10-CM | POA: Insufficient documentation

## 2019-06-16 DIAGNOSIS — Z801 Family history of malignant neoplasm of trachea, bronchus and lung: Secondary | ICD-10-CM | POA: Diagnosis not present

## 2019-06-16 DIAGNOSIS — Z8543 Personal history of malignant neoplasm of ovary: Secondary | ICD-10-CM | POA: Insufficient documentation

## 2019-06-16 DIAGNOSIS — C5701 Malignant neoplasm of right fallopian tube: Secondary | ICD-10-CM

## 2019-06-16 DIAGNOSIS — Z95828 Presence of other vascular implants and grafts: Secondary | ICD-10-CM

## 2019-06-16 DIAGNOSIS — G4730 Sleep apnea, unspecified: Secondary | ICD-10-CM | POA: Insufficient documentation

## 2019-06-16 DIAGNOSIS — R197 Diarrhea, unspecified: Secondary | ICD-10-CM | POA: Diagnosis not present

## 2019-06-16 DIAGNOSIS — R194 Change in bowel habit: Secondary | ICD-10-CM

## 2019-06-16 DIAGNOSIS — R102 Pelvic and perineal pain: Secondary | ICD-10-CM | POA: Diagnosis not present

## 2019-06-16 DIAGNOSIS — I700 Atherosclerosis of aorta: Secondary | ICD-10-CM | POA: Insufficient documentation

## 2019-06-16 LAB — COMPREHENSIVE METABOLIC PANEL
ALT: 30 U/L (ref 0–44)
AST: 24 U/L (ref 15–41)
Albumin: 4.2 g/dL (ref 3.5–5.0)
Alkaline Phosphatase: 108 U/L (ref 38–126)
Anion gap: 12 (ref 5–15)
BUN: 14 mg/dL (ref 8–23)
CO2: 25 mmol/L (ref 22–32)
Calcium: 9.3 mg/dL (ref 8.9–10.3)
Chloride: 103 mmol/L (ref 98–111)
Creatinine, Ser: 0.67 mg/dL (ref 0.44–1.00)
GFR calc Af Amer: >60 mL/min (ref >60)
GFR calc non Af Amer: >60 mL/min (ref >60)
Glucose, Bld: 104 mg/dL — ABNORMAL HIGH (ref 70–99)
Potassium: 3.7 mmol/L (ref 3.5–5.1)
Sodium: 140 mmol/L (ref 135–145)
Total Bilirubin: 0.3 mg/dL (ref 0.3–1.2)
Total Protein: 7.1 g/dL (ref 6.5–8.1)

## 2019-06-16 MED ORDER — HEPARIN SOD (PORK) LOCK FLUSH 100 UNIT/ML IV SOLN
500.0000 [IU] | Freq: Once | INTRAVENOUS | Status: DC | PRN
  Filled 2019-06-16: qty 5

## 2019-06-16 MED ORDER — SODIUM CHLORIDE 0.9% FLUSH
10.0000 mL | INTRAVENOUS | Status: DC | PRN
  Administered 2019-06-16: 10 mL via INTRAVENOUS
  Filled 2019-06-16: qty 10

## 2019-06-16 NOTE — Patient Instructions (Signed)

## 2019-06-19 ENCOUNTER — Other Ambulatory Visit: Payer: Self-pay

## 2019-06-19 ENCOUNTER — Ambulatory Visit (HOSPITAL_COMMUNITY)
Admission: RE | Admit: 2019-06-19 | Discharge: 2019-06-19 | Disposition: A | Discharge status: Home / Self Care | Payer: Medicare HMO | Source: Ambulatory Visit | Attending: Gynecologic Oncology | Admitting: Gynecologic Oncology

## 2019-06-19 ENCOUNTER — Encounter (HOSPITAL_COMMUNITY): Payer: Self-pay

## 2019-06-19 ENCOUNTER — Telehealth: Payer: Self-pay | Admitting: Gynecologic Oncology

## 2019-06-19 DIAGNOSIS — K6389 Other specified diseases of intestine: Secondary | ICD-10-CM | POA: Diagnosis not present

## 2019-06-19 DIAGNOSIS — Z90710 Acquired absence of both cervix and uterus: Secondary | ICD-10-CM | POA: Insufficient documentation

## 2019-06-19 DIAGNOSIS — Z9049 Acquired absence of other specified parts of digestive tract: Secondary | ICD-10-CM | POA: Insufficient documentation

## 2019-06-19 DIAGNOSIS — Z8543 Personal history of malignant neoplasm of ovary: Secondary | ICD-10-CM | POA: Insufficient documentation

## 2019-06-19 DIAGNOSIS — R194 Change in bowel habit: Secondary | ICD-10-CM | POA: Diagnosis not present

## 2019-06-19 DIAGNOSIS — C569 Malignant neoplasm of unspecified ovary: Secondary | ICD-10-CM | POA: Diagnosis not present

## 2019-06-19 MED ORDER — IOHEXOL 300 MG/ML  SOLN
100.0000 mL | Freq: Once | INTRAMUSCULAR | Status: AC | PRN
  Administered 2019-06-19: 100 mL via INTRAVENOUS

## 2019-06-19 MED ORDER — SODIUM CHLORIDE (PF) 0.9 % IJ SOLN
INTRAMUSCULAR | Status: AC
  Filled 2019-06-19: qty 50

## 2019-06-19 MED ORDER — HEPARIN SOD (PORK) LOCK FLUSH 100 UNIT/ML IV SOLN
INTRAVENOUS | Status: AC
  Filled 2019-06-19: qty 5

## 2019-06-19 MED ORDER — IOHEXOL 300 MG/ML  SOLN
30.0000 mL | Freq: Once | INTRAMUSCULAR | Status: AC | PRN
  Administered 2019-06-19: 30 mL via ORAL

## 2019-06-19 MED ORDER — HEPARIN SOD (PORK) LOCK FLUSH 100 UNIT/ML IV SOLN
500.0000 [IU] | Freq: Once | INTRAVENOUS | Status: AC
  Administered 2019-06-19: 500 [IU] via INTRAVENOUS

## 2019-06-19 NOTE — Progress Notes (Signed)
Gynecologic Oncology Telehealth Consult Note: Gyn-Onc  I connected with Kristin Webb on 06/20/19 at  3:00 PM EST by telephone and verified that I am speaking with the correct person using two identifiers.  I discussed the limitations, risks, security and privacy concerns of performing an evaluation and management service by telemedicine and the availability of in-person appointments. I also discussed with the patient that there may be a patient responsible charge related to this service. The patient expressed understanding and agreed to proceed.  No other persons participated in the visit.  Patient's location: Home Provider's location: [facility]  Chief Complaint: Follow-up recent CT scan in the setting of mildly elevated CA-125 and bowel symptoms with a history of platinum sensitive ovarian cancer  Treatment History: Oncology History Overview Note  Oncologic Summary: 1. 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 Random CA 125 elevation January 2019  Question mesenteric nodules and anterior abdominal wall nodule 3. GeneDx Breast/Ovary Panel negative (including BRCA, MMR's, RAD51 etc) ? 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  Interval development of large mid abdominal mass highly concerning for right ovarian cancer.  There is mild omental nodularity on the left, and peritoneal disease cannot be completely excluded.  There is no ascites or other evidence of metastatic disease. 2.  Mild associated renal pelvocaliectasis bilaterally without obstruction.  Nonobstructing left renal calculus and a small right renal angiomyolipoma noted incidentally.   11/08/2012  Pathology Results   1. 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 - BENIGN FALLOPIAN TUBE WITH NONSPECIFIC CHRONIC INFLAMMATION. 2. Ovary and fallopian tube, left - BENIGN OVARY; NEGATIVE FOR ATYPIA OR MALIGNANCY. - BENIGN FALLOPIAN TUBE; NEGATIVE FOR ATYPIA OR MALIGNANCY. 3. Omentum, resection for tumor - HIGH GRADE CARCINOMA, SEE COMMENT. 4. Appendix, Other than Incidental - FIBROUS OBLITERATION OF APPENDICEAL TIP. - NEGATIVE FOR MALIGNANCY.   11/08/2012 Surgery   Surgery: Exploratory laparotomy, bilateral salpingo-oophorectomy, appendectomy, infacolic omentectomy, optimal debulking  Surgeons:  Paola A. Alycia Rossetti, MD; Lahoma Crocker, MD   Assistant: Caswell Corwin  Pathology: Bilateral fallopian tubes and ovaries to pathology. Appendix as well as omentum. 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  Operative findings: 25 cm right adnexal mass with smooth surface. Surgically absent uterus. Atrophic-appearing left ovary. Normal appearing appendix. Within the omentum there were centimeter nodules scattered throughout the omentum. The remainder of the surfaces were benign.   12/08/2012 Procedure   Impression:  Placement of a subcutaneous port device.  The catheter tip is in the lower SVC and ready to be used.    12/13/2012 - 03/28/2013 Chemotherapy   s/p 6 cycles of paclitaxel and carboplatin   12/13/2012 - 03/28/2013 Chemotherapy   The patient had 6 cycles of carboplatin and Taxol   03/24/2013 Imaging   US abdomen   04/24/2013 Imaging   CT abdomen Interval resection of the large right pelvic and lower abdominal mass lesion with apparent omentectomy.  No evidence for intraperitoneal free fluid on today's study.  No discernible peritoneal  lesions.  Interval thrombosis of the  right gonadal vein.   09/07/2013 Genetic Testing   Patient has genetic testing done for BRCA1/2 panel Results revealed patient has no mutation(s):   07/09/2015 Imaging   CT abdomen 1. 12 mm obstructive calculus at the left ureteropelvic junction with moderate proximal hydronephrosis. 2. 2 small supraumbilical ventral hernias, one containing a short segment of the mid transverse colon and the other containing a short segment of the mid small bowel. There is no associated evidence to suggest bowel incarceration or obstruction at this time. 3. Tiny locule of gas non dependently in the lumen of the urinary bladder. This is presumably iatrogenic related to recent catheterization for urinalysis. Alternatively, this could be seen in the setting of urinary tract infection with gas-forming organisms. Clinical correlation for history of recent catheterization is recommended. 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 5. Status post cholecystectomy. 6. Additional incidental findings, as above.    12/11/2016 Imaging   Ct abdomen 1. No evidence of metastatic ovarian cancer. 2. Recurrent subxiphoid ventral abdominal wall hernia containing transverse colon. No evidence of incarceration or obstruction. 3. Stable incidental findings in the liver and kidneys. No recurrent urinary tract calculus. 4. Progressive lower lumbar spondylosis. 5.  Aortic Atherosclerosis (ICD10-I70.0).    08/30/2017 Imaging   MRI thoracic spine 1. At T5-6 there is a small central disc protrusion contacting the ventral thoracic spinal cord. No central canal or foraminal stenosis. 2. At T9-10 there is a small right paracentral disc protrusion. 3.  No acute osseous injury of the thoracic spine. 4. No aggressive osseous lesion to suggest metastatic disease.   09/24/2017 Tumor Marker   Patient's tumor was tested for the following markers: CA-125 Results of the tumor marker  test revealed 21.7   09/30/2017 Imaging   CT abdomen 1. New small clustered soft tissue nodules in the left lower quadrant in the sigmoid mesentery, largest 1.0 cm, which could represent recurrent peritoneal tumor implants. No ascites.  2. Midline high ventral abdominal wall hernia containing a portion of the transverse colon is mildly increased in size, and without bowel complication at this time. 3. Chronic findings include: Aortic Atherosclerosis (ICD10-I70.0). Diffuse hepatic steatosis. 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   10/11/2017 PET scan   1. Nodules in the sigmoid mesentery are hypermetabolic and highly worrisome for metastatic disease. 2. Attic steatosis.    11/03/2017 Procedure   Successful CT-guided rectus abdominal muscle mass core biopsy. Path: - FOREIGN BODY GIANT CELL REACTION INVOLVING FIBROADIPOSE TISSUE AND SKELETAL MUSCLE. - NO EVIDENCE OF MALIGNANCY.   11/04/2017 Cancer Staging   Staging form: Fallopian Tube, AJCC 7th Edition - Clinical: FIGO Stage IIIC, calculated as Stage III (T3, N0, M0) - Signed by [name], MD on 11/04/2017   05/2018 Imaging   PET: IMPRESSION: 1. Redemonstration of hypermetabolic nodules within the sigmoid mesocolon. Mild response to therapy relative to CT of 02/24/2018. Mixed response to therapy compared to the most recent PET of 10/11/2017. 2. Hypermetabolism within the right pelvic rectus musculature, increased since the prior PET.  Clinical service requested comparison to the 11/24/2017 CT. Index 10 mm nodule within the sigmoid mesocolon was similar to the 02/24/2018 CT, and as described on that exam, increased from 7 mm on 11/24/2017. More inferior nodule within the mesocolon measures 12 mm today on image 156/4 and 8 mm on 11/24/2017.   05/2018 Imaging   CT: IMPRESSION: 1. Since 02/24/2018, decreased size of peritoneal nodules centered in the sigmoid  mesocolon. 2. No  evidence of new or progressive disease. 3. Hepatic steatosis. 4. Subcentimeter right renal angiomyolipoma, similar. 5.  Aortic Atherosclerosis (ICD10-I70.0). 6. Ventral abdominal wall laxity containing transverse colon, similar.   08/2018 Imaging   CT: IMPRESSION: 1. Nodules within the sigmoid mesocolon have decreased in size compared to prior. No new peritoneal or omental nodularity. 2. No evidence local recurrence the pelvis. 3. Ventral hernia contains a segment of transverse colon. No change from prior.       Interval History: The patient endorses constipation now for the last 3 weeks.  Her norm has been 8-10 bowel movements a day and she takes Colestid to help with her diarrhea.  Over the last 3 weeks, she endorses having 3-4 bowel movements a day, each of which she has approximately 30 minutes after eating meals.  She has decreased the amount of Colestid that she is taking, now using a fourth of a tab and has tried not taking it at all.  When she discontinued it completely, her stools were much looser and almost unformed.  With half a tablet, she feels that her stools are somewhat hard but they look normally formed and soft on inspection.  She also describes intermittent low pelvic pain as well as nausea when she needs to have a bowel movement.  She endorses the symptoms most days and with most bowel movements.  Past Medical/Surgical History: Past Medical History:  Diagnosis Date  . Burning mouth syndrome   . Chronic fatigue   . Constipation   . Diabetes ([place]) 05/09/2018  . Diarrhea    in the past after gallbladder removal  . Fibromyalgia   . GERD (gastroesophageal reflux disease)   . History of kidney stones 07/2015  . Hyperlipidemia   . Hypertension    borderline not on meds   . Insomnia secondary to depression with anxiety   . Lumbar disc disease   . Ovarian cancer ([place]) 2014  . Pelvic mass in female   . Pneumonia    hx of pneumonia as an infant  . PONV (postoperative  nausea and vomiting)    pain from gas hernia 2017  . Shortness of breath    with exertion   . Sleep apnea    uses CPAP  . Yeast infection     Past Surgical History:  Procedure Laterality Date  . ABDOMINAL HYSTERECTOMY     early 55s  . APPENDECTOMY     WITH DEBULKING/BSO  . CHOLECYSTECTOMY     early 11s  . CYSTOSCOPY W/ URETERAL STENT PLACEMENT Left 07/09/2015   DUE TO NEPHROLITHIASIS Procedure: CYSTOSCOPY WITH LEFT RETROGRADE PYELOGRAM/ LEFT URETERAL STENT PLACEMENT;  Surgeon: Ardis Hughs, MD;  Location: [facility];  Service: Urology;  Laterality: Left;  . HERNIA REPAIR    . INCISIONAL HERNIA REPAIR N/A 12/25/2015   WITH MESH Procedure:  INCISIONAL HERNIA REPAIR;  Surgeon: Ralene Ok, MD;  Location: [facility];  Service: General;  Laterality: N/A;  . INCISIONAL HERNIA REPAIR N/A 10/21/2018   Procedure: LAPAROSCOPIC INCISIONAL HERNIA REPAIR WITH MESH;  Surgeon: Ralene Ok, MD;  Location: [facility];  Service: General;  Laterality: N/A;  . INSERTION OF MESH N/A 12/25/2015   Procedure: INSERTION OF MESH;  Surgeon: Ralene Ok, MD;  Location: [facility];  Service: General;  Laterality: N/A;  . LAPAROTOMY Bilateral 11/08/2012   Procedure: EXPLORATORY LAPAROTOMY BILATERAL SALPINGO OOPHORECTOMY TUMOR DEBULKING ;  Surgeon: Imagene Gurney A. Alycia Rossetti, MD;  Location: [facility];  Service: Gynecology;  Laterality: Bilateral;  APPENDECTOMY / OMENTECTOMY  . LAPAROTOMY N/A 12/25/2015   Procedure: EXPLORATORY LAPAROTOMY;  Surgeon: Ralene Ok, MD;  Location: [facility];  Service: General;  Laterality: N/A;  . LYSIS OF ADHESION N/A 12/25/2015   Procedure: LYSIS OF ADHESION;  Surgeon: Ralene Ok, MD;  Location: [facility];  Service: General;  Laterality: N/A;  . PARTIAL HYSTERECTOMY  2006  . TRIGGER FINGER RELEASE      Family History  Problem Relation Age of Onset  . Lung cancer Mother 71  . High blood pressure Mother   . High Cholesterol Mother   . Lung cancer Father 28       2 ppd smoker  .  Parkinson's disease Father   . Kidney disease Other     Social History   Socioeconomic History  . Marital status: Widowed    Spouse name: Kristin Webb  . Number of children: 2  . Years of education: Master's  . Highest education level: Not on file  Occupational History  . Not on file  Social Needs  . Financial resource strain: Not on file  . Food insecurity    Worry: Not on file    Inability: Not on file  . Transportation needs    Medical: Not on file    Non-medical: Not on file  Tobacco Use  . Smoking status: Never Smoker  . Smokeless tobacco: Never Used  Substance and Sexual Activity  . Alcohol use: No  . Drug use: No  . Sexual activity: Not on file  Lifestyle  . Physical activity    Days per week: Not on file    Minutes per session: Not on file  . Stress: Not on file  Relationships  . Social Herbalist on phone: Not on file    Gets together: Not on file    Attends religious service: Not on file    Active member of club or organization: Not on file    Attends meetings of clubs or organizations: Not on file    Relationship status: Not on file  Other Topics Concern  . Not on file  Social History Narrative   Patient is married Kristin Webb) and lives at home with her husband.   Patient has 2 children by birth and 13 children all together.   Patient has a Oceanographer.   Patient is right-handed.   Patient drinks very little caffeine.          Current Medications:  Current Outpatient Medications:  .  Armodafinil 250 MG tablet, Take 1 tablet (250 mg total) by mouth daily., Disp: 30 tablet, Rfl: 0 .  Carboxymethylcellulose Sodium (EYE DROPS OP), Apply 2 drops to eye daily as needed (dry eye)., Disp: , Rfl:  .  cholecalciferol (VITAMIN D3) 25 MCG (1000 UT) tablet, Take 1,000 Units by mouth daily., Disp: , Rfl:  .  colestipol (COLESTID) 1 g tablet, Take 0.5-1 g by mouth daily. Pt taking 1/2 to 1 tablet daily depending on meal choice, Disp: , Rfl:  .  EPINEPHrine  (EPIPEN 2-PAK) 0.3 mg/0.3 mL DEVI, Inject 0.3 mg into the muscle once as needed (For bee stings.). Reported on 01/20/2016, Disp: , Rfl:  .  eszopiclone (LUNESTA) 2 MG TABS tablet, TAKE ONE-HALF TO ONE TABLET BY MOUTH EVERY NIGHT AT BEDTIME, Disp: 90 tablet, Rfl: 0 .  FLUoxetine (PROZAC) 10 MG capsule, Take 10 mg by mouth every morning. Takes with a 67m capsule for her total dose of 331m, Disp: , Rfl:  .  FLUoxetine (  PROZAC) 20 MG capsule, Take 20 mg by mouth every morning. Takes with a 25m capsule for her total dose of 313m, Disp: , Rfl:  .  ibuprofen (ADVIL,MOTRIN) 200 MG tablet, Take 400-600 mg by mouth every 6 (six) hours as needed for mild pain or moderate pain., Disp: , Rfl:  .  MELATONIN PO, Take 10 mg by mouth at bedtime. , Disp: , Rfl:  .  metFORMIN (GLUCOPHAGE-XR) 500 MG 24 hr tablet, Take 500 mg by mouth daily with breakfast. , Disp: , Rfl: 3 .  Multiple Vitamin (MULTIVITAMIN WITH MINERALS) TABS, Take 1 tablet by mouth every morning. , Disp: , Rfl:  .  naproxen sodium (ALEVE) 220 MG tablet, Take 220 mg by mouth daily as needed (pain). , Disp: , Rfl:  .  rosuvastatin (CRESTOR) 20 MG tablet, Take 20 mg by mouth at bedtime., Disp: , Rfl:  .  triamterene-hydrochlorothiazide (MAXZIDE-25) 37.5-25 MG tablet, Take 1 tablet by mouth daily. (Patient taking differently: Take 1 tablet by mouth every morning. ), Disp: 30 tablet, Rfl: 1 No current facility-administered medications for this visit.   Facility-Administered Medications Ordered in Other Visits:  .  sodium chloride flush (NS) 0.9 % injection 10 mL, 10 mL, Intravenous, PRN, GeNancy MarusMD, 10 mL at 05/12/17 [id number] of Symptoms: Complete review is negative except as above in Interval History.  Physical Exam: There were no vitals taken for this visit. Not performed given limitations of this type of visit.  Laboratory & Radiologic Studies:  Ref Range & Units 3wk ago (05/24/19) 1027moo (08/23/18) 40yr49yr (05/25/18) 40yr 77yr (02/22/18) 40yr a20yr4/15/19)  Cancer Antigen (CA) 125 0.0 - 38.1 U/mL 15.7  9.5 CM  15.2 CM  14.1 CM  10.1 CM    CT A/P: Comparison exams are made available. Comparison CT 09/08/2018 and 05/27/2018. PET-CT 05/27/2018  There is a nodule within the proximal aspect of the sigmoid colon measuring 2.2 by 2.2 cm. In comparison to prior CTs and PET-CT there was a hypermetabolic nodule at this location on the PET-CT of 08/27/2017 and on the CT of 09/08/2018 there was a small residual nodule. At that time (09/08/2018) the nodule measured 1.4 by 1.3 cm. Therefore this nodule has increased in the interval and concerning for recurrence of a serosal implant. The previous described lymph nodes in the sigmoid mesocolon and more central mesentery are not increased in size and not pathologic by size criteria. IMPRESSION: (ADDENDED) Concern for recurrence of serosal metastasis in the proximal sigmoid colon with a 2 cm enlarging lesion. Lesion extends into the lumen. No evidence of high-grade obstruction. Consider FDG PET scan and/or potential colonoscopy for evaluation.  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  The patient and I discussed her CT scan from yesterday.  I reviewed it in detail over this morning with the reading radiologist.  In comparison to her last imaging, the been an increase in side of a sigmoid lesion from 1.4 cm to just over 2 cm.  Otherwise, the mesenteric lymph nodes near the sigmoid that have been previously noted are unchanged and not pathologic by size criteria.  No other needs or evidence of metastatic disease are noted on this most recent imaging.  We discussed that while concerning  for recurrent ovarian cancer, this is not  a typical picture given the very indolent course of this small sigmoid lesion.  We discussed other etiologies of this lesion including a new primary malignancy, which I think is quite unlikely, or an inflammatory/infectious process.  I also this is unlikely as well.  Given its location adjacent to the sigmoid, this lesion was unofficially reviewed by an interventional radiologist and is not amenable to biopsy.  We will plan to proceed with PET to either identify or rule out other possible evidence of metastatic disease.  If no other disease is noted, I spoke with the patient about pursuing a colonoscopy to see if a biopsy of this mass may be possible across the bowel wall.  I discussed the assessment and treatment plan with the patient. The patient was provided with an opportunity to ask questions and all were answered. The patient agreed with the plan and demonstrated an understanding of the instructions.  He knows that we will place an order for a PET scan which I am hoping will be scheduled in the next week or so.  We will also start the process of referring her for colonoscopy although we will wait for the PET results prior to having her undergo this procedure.  The patient was advised to call back or see an in-person evaluation if the symptoms worsen.   I provided 20 minutes of non face-to-face telephone visit time during this encounter, and > 50% was spent counseling as documented under my assessment & plan.   Jeral Pinch, MD  Division of Gynecologic Oncology  Department of Obstetrics and Gynecology  [facility]

## 2019-06-20 ENCOUNTER — Telehealth: Payer: Self-pay

## 2019-06-20 ENCOUNTER — Other Ambulatory Visit: Payer: Self-pay | Admitting: Gynecologic Oncology

## 2019-06-20 ENCOUNTER — Encounter: Payer: Self-pay | Admitting: Gynecologic Oncology

## 2019-06-20 ENCOUNTER — Inpatient Hospital Stay (HOSPITAL_BASED_OUTPATIENT_CLINIC_OR_DEPARTMENT_OTHER): Payer: Medicare HMO | Admitting: Gynecologic Oncology

## 2019-06-20 DIAGNOSIS — K5900 Constipation, unspecified: Secondary | ICD-10-CM

## 2019-06-20 DIAGNOSIS — Z9221 Personal history of antineoplastic chemotherapy: Secondary | ICD-10-CM | POA: Diagnosis not present

## 2019-06-20 DIAGNOSIS — Z8543 Personal history of malignant neoplasm of ovary: Secondary | ICD-10-CM

## 2019-06-20 DIAGNOSIS — R971 Elevated cancer antigen 125 [CA 125]: Secondary | ICD-10-CM | POA: Diagnosis not present

## 2019-06-20 NOTE — Patient Instructions (Signed)
I will call you with the results of your PET scan.  In the meantime, we will work on getting you scheduled for a colonoscopy if needed after your imaging.

## 2019-06-20 NOTE — Telephone Encounter (Signed)
I spoke to patient and let her know her PET scan is scheduled for 06/29/2019 at 0800, arrive at 0733. Npo after mn, no gum, candy and, low carbs the night before.  Pt is to hold her metformin and take after the PET scan. I let [name] know we are setting her up with an appointment with [facility].  She verbalized understanding.

## 2019-06-21 ENCOUNTER — Telehealth: Payer: Self-pay | Admitting: *Deleted

## 2019-06-21 NOTE — Telephone Encounter (Signed)
Fax referral sheet and records to [facility]

## 2019-06-26 ENCOUNTER — Telehealth: Payer: Self-pay | Admitting: Oncology

## 2019-06-26 NOTE — Telephone Encounter (Signed)
Called [facility] and spoke to Amy, Referral Coordinator.  She said she spoke to Megann this weekend and discussed that Jaydynn wants to wait for her PET scan results on 06/29/19 before scheduling an appointment.  Amy said they can schedule her to see a PA next week for a consult if needed and also asked if we can fax the PET results to [phone] when they are available.

## 2019-06-29 ENCOUNTER — Ambulatory Visit (HOSPITAL_COMMUNITY)
Admission: RE | Admit: 2019-06-29 | Discharge: 2019-06-29 | Disposition: A | Discharge status: Home / Self Care | Payer: Medicare HMO | Source: Ambulatory Visit | Attending: Gynecologic Oncology | Admitting: Gynecologic Oncology

## 2019-06-29 ENCOUNTER — Other Ambulatory Visit: Payer: Self-pay

## 2019-06-29 DIAGNOSIS — Z79899 Other long term (current) drug therapy: Secondary | ICD-10-CM | POA: Diagnosis not present

## 2019-06-29 DIAGNOSIS — K639 Disease of intestine, unspecified: Secondary | ICD-10-CM | POA: Insufficient documentation

## 2019-06-29 DIAGNOSIS — Z8543 Personal history of malignant neoplasm of ovary: Secondary | ICD-10-CM | POA: Diagnosis not present

## 2019-06-29 DIAGNOSIS — C569 Malignant neoplasm of unspecified ovary: Secondary | ICD-10-CM | POA: Diagnosis not present

## 2019-06-29 LAB — GLUCOSE, CAPILLARY: Glucose-Capillary: 117 mg/dL — ABNORMAL HIGH (ref 70–99)

## 2019-06-29 MED ORDER — FLUDEOXYGLUCOSE F - 18 (FDG) INJECTION
9.0400 | Freq: Once | INTRAVENOUS | Status: AC | PRN
  Administered 2019-06-29: 9.04 via INTRAVENOUS

## 2019-06-30 ENCOUNTER — Telehealth: Payer: Self-pay | Admitting: Gynecologic Oncology

## 2019-06-30 NOTE — Telephone Encounter (Signed)
Called the patient and reviewed PET scan results.  I continue to be suspicious that the sigmoid mesenteric mass is recurrent ovarian cancer although quite indolent as has only doubled in size in more than a year (currently 2.6 cm in greatest dimension).  We are planning to review her case and imaging at tumor board on Monday and I will call her with the results of this discussion.  Currently, I am thinking the next best step is surgery for resection of this mass which would almost certainly include a colon resection.  Patient is amenable to this and given the very indolent nature of this mass I think it is reasonable to wait until after the holidays which is her preference.  We have tentatively scheduled surgery for January 7.  Jeral Pinch MD Gynecologic Oncology

## 2019-07-03 ENCOUNTER — Other Ambulatory Visit: Payer: Self-pay | Admitting: Oncology

## 2019-07-03 ENCOUNTER — Telehealth: Payer: Self-pay | Admitting: Gynecologic Oncology

## 2019-07-03 NOTE — Telephone Encounter (Signed)
Called the patient to review discussion at tumor board this morning.  After our discussion, the consensus was to move forward with attempted biopsy by colonoscopy given morbidity associated with surgery as well as equal survival outcomes with the use of chemotherapy in the recurrent setting versus optimal secondary debulking followed by chemotherapy.  The patient surgical history is notable for 2 hernia repairs, the most recent one with the use of mesh.  I reached out to [facility] today and received a call back midday from Dr. Oletta Lamas, the gastroenterologist who performed the patient's last colonoscopy.  He has reviewed her imaging and thinks that a colonoscopic biopsy is feasible.  He is retiring at the end of the month but will talk to one of his partners about the case.  I called the patient and relayed the discussion at our conference this morning and my conversation with Dr. Oletta Lamas.  The patient is understanding and amenable to proceeding with colonoscopy and attempt at biopsy.  We discussed possible conclusions and that if no diagnosis or biopsy was possible, we will proceed with surgery as planned in early January.  Jeral Pinch MD Gynecologic Oncology

## 2019-07-03 NOTE — Progress Notes (Signed)
Gynecologic Oncology Multi-Disciplinary Disposition Conference Note  Date of the Conference: 07/03/2019  Patient Name: Kristin Webb  Primary GYN Oncologist: Dr. 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 Disposition is to colonoscopy with biopsy or surgery to obtain biopsy and/or resection of the lesion.   This Multidisciplinary conference took place involving physicians from [place], [place], Radiation Oncology, Pathology, Radiology along with the Gynecologic Oncology Nurse Practitioner and RN.  Comprehensive assessment of the patient's malignancy, staging, need for surgery, chemotherapy, radiation therapy, and need for further testing were reviewed. Supportive measures, both inpatient and following discharge were also discussed. The recommended plan of care is documented. Greater than 35 minutes were spent correlating and coordinating this patient's care.

## 2019-07-11 ENCOUNTER — Other Ambulatory Visit: Payer: Self-pay | Admitting: Neurology

## 2019-07-12 ENCOUNTER — Telehealth: Payer: Self-pay | Admitting: Oncology

## 2019-07-12 ENCOUNTER — Telehealth: Payer: Self-pay | Admitting: Neurology

## 2019-07-12 MED ORDER — MODAFINIL 200 MG PO TABS: 200.0000 mg | ORAL_TABLET | ORAL | 0 refills | Status: DC

## 2019-07-12 NOTE — Addendum Note (Signed)
Addended by: Larey Seat on: 07/12/2019 04:32 PM   Modules accepted: Orders

## 2019-07-12 NOTE — Telephone Encounter (Signed)
Clarissa @ [facility] has called to inform that they do not have Nuvigil, they are asking if Dr would accept them filling Provigil.  Odette Horns is asking RN calls and ask for her please.

## 2019-07-12 NOTE — Addendum Note (Signed)
Addended by: Darleen Crocker on: 07/12/2019 01:41 PM   Modules accepted: Orders

## 2019-07-12 NOTE — Telephone Encounter (Signed)
Called Remo Lipps regarding [facility] appointments.  She said she is scheduled for a colonoscopy at [place] on 12/16 or 12/17.

## 2019-07-12 NOTE — Telephone Encounter (Signed)
Ok thanks 

## 2019-07-12 NOTE — Telephone Encounter (Signed)
Yes, I am OK with generic provigil trial for  30 days.

## 2019-07-21 DIAGNOSIS — Z1159 Encounter for screening for other viral diseases: Secondary | ICD-10-CM | POA: Diagnosis not present

## 2019-07-26 DIAGNOSIS — D120 Benign neoplasm of cecum: Secondary | ICD-10-CM | POA: Diagnosis not present

## 2019-07-26 DIAGNOSIS — K5730 Diverticulosis of large intestine without perforation or abscess without bleeding: Secondary | ICD-10-CM | POA: Diagnosis not present

## 2019-07-26 DIAGNOSIS — D122 Benign neoplasm of ascending colon: Secondary | ICD-10-CM | POA: Diagnosis not present

## 2019-07-26 DIAGNOSIS — R933 Abnormal findings on diagnostic imaging of other parts of digestive tract: Secondary | ICD-10-CM | POA: Diagnosis not present

## 2019-07-26 DIAGNOSIS — C785 Secondary malignant neoplasm of large intestine and rectum: Secondary | ICD-10-CM | POA: Diagnosis not present

## 2019-07-26 DIAGNOSIS — D123 Benign neoplasm of transverse colon: Secondary | ICD-10-CM | POA: Diagnosis not present

## 2019-07-27 ENCOUNTER — Telehealth: Payer: Self-pay | Admitting: Oncology

## 2019-07-27 NOTE — Telephone Encounter (Signed)
Left a message at [facility] requesting colonoscopy report.  Requested a return call.

## 2019-07-28 DIAGNOSIS — M159 Polyosteoarthritis, unspecified: Secondary | ICD-10-CM | POA: Diagnosis not present

## 2019-07-28 DIAGNOSIS — I10 Essential (primary) hypertension: Secondary | ICD-10-CM | POA: Diagnosis not present

## 2019-07-28 DIAGNOSIS — E782 Mixed hyperlipidemia: Secondary | ICD-10-CM | POA: Diagnosis not present

## 2019-07-28 DIAGNOSIS — G4733 Obstructive sleep apnea (adult) (pediatric): Secondary | ICD-10-CM | POA: Diagnosis not present

## 2019-07-28 DIAGNOSIS — E1159 Type 2 diabetes mellitus with other circulatory complications: Secondary | ICD-10-CM | POA: Diagnosis not present

## 2019-07-31 ENCOUNTER — Other Ambulatory Visit: Payer: Self-pay | Admitting: Neurology

## 2019-07-31 ENCOUNTER — Telehealth: Payer: Self-pay | Admitting: Oncology

## 2019-07-31 DIAGNOSIS — G4733 Obstructive sleep apnea (adult) (pediatric): Secondary | ICD-10-CM

## 2019-07-31 DIAGNOSIS — F5102 Adjustment insomnia: Secondary | ICD-10-CM

## 2019-07-31 MED ORDER — ESZOPICLONE 2 MG PO TABS: ORAL_TABLET | ORAL | 0 refills | Status: DC

## 2019-07-31 NOTE — Telephone Encounter (Signed)
Called [facility] and requested pathology report from 07/26/19 colonoscopy.  They said the path report has not been signed by the doctor yet.  They will fax it when it is signed.

## 2019-08-01 DIAGNOSIS — D120 Benign neoplasm of cecum: Secondary | ICD-10-CM | POA: Diagnosis not present

## 2019-08-01 DIAGNOSIS — C785 Secondary malignant neoplasm of large intestine and rectum: Secondary | ICD-10-CM | POA: Diagnosis not present

## 2019-08-01 DIAGNOSIS — D123 Benign neoplasm of transverse colon: Secondary | ICD-10-CM | POA: Diagnosis not present

## 2019-08-01 DIAGNOSIS — D122 Benign neoplasm of ascending colon: Secondary | ICD-10-CM | POA: Diagnosis not present

## 2019-08-02 ENCOUNTER — Telehealth: Payer: Self-pay | Admitting: *Deleted

## 2019-08-02 NOTE — Telephone Encounter (Signed)
Returned the patient's call and explained that per Dr Berline Lopes she does not need another scan.

## 2019-08-02 NOTE — Telephone Encounter (Signed)
Called and spoke to a nurse at [facility] regarding her pathology from 12/16; pathology not back yet. [facility] will fax results once finalized

## 2019-08-07 ENCOUNTER — Telehealth: Payer: Self-pay | Admitting: Gynecologic Oncology

## 2019-08-07 ENCOUNTER — Telehealth: Payer: Self-pay | Admitting: Oncology

## 2019-08-07 NOTE — Telephone Encounter (Signed)
Called [facility] regarding pathology from 07/26/19 biopsy/colonoscopy.  They said the pathology is not back yet.  They will have Dr. Leanna Sato assistant call us back.

## 2019-08-07 NOTE — Telephone Encounter (Addendum)
Aneta Mins and scheduled appointment with Dr. Alvy Bimler tomorrow at 9:15 am.

## 2019-08-07 NOTE — Telephone Encounter (Signed)
Called patient once we received pathology from [facility]. 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 Discussed treatment options we'd reviewed previously and given, excellent initial response to therapy, long interval since platinum chemotherapy, and morbidity of surgery (which would include rectosigmoid resection), I recommend that we pursue systemic treatment. Patient understanding and in agreement with this plan. Will have patient scheduled to see Dr. Alvy Bimler.  Jeral Pinch MD Gynecologic Oncology

## 2019-08-08 ENCOUNTER — Encounter: Payer: Self-pay | Admitting: Hematology and Oncology

## 2019-08-08 ENCOUNTER — Inpatient Hospital Stay: Payer: Medicare HMO | Attending: Gynecologic Oncology | Admitting: Hematology and Oncology

## 2019-08-08 ENCOUNTER — Telehealth: Payer: Self-pay | Admitting: Hematology and Oncology

## 2019-08-08 ENCOUNTER — Other Ambulatory Visit: Payer: Self-pay

## 2019-08-08 VITALS — BP 135/80 | HR 87 | Temp 98.7°F | Resp 17 | Ht 62.0 in | Wt 185.8 lb

## 2019-08-08 DIAGNOSIS — Z801 Family history of malignant neoplasm of trachea, bronchus and lung: Secondary | ICD-10-CM | POA: Insufficient documentation

## 2019-08-08 DIAGNOSIS — C5700 Malignant neoplasm of unspecified fallopian tube: Secondary | ICD-10-CM | POA: Diagnosis not present

## 2019-08-08 DIAGNOSIS — Z7189 Other specified counseling: Secondary | ICD-10-CM

## 2019-08-08 DIAGNOSIS — E119 Type 2 diabetes mellitus without complications: Secondary | ICD-10-CM

## 2019-08-08 DIAGNOSIS — Z79899 Other long term (current) drug therapy: Secondary | ICD-10-CM | POA: Diagnosis not present

## 2019-08-08 DIAGNOSIS — I10 Essential (primary) hypertension: Secondary | ICD-10-CM | POA: Insufficient documentation

## 2019-08-08 DIAGNOSIS — Z7984 Long term (current) use of oral hypoglycemic drugs: Secondary | ICD-10-CM | POA: Diagnosis not present

## 2019-08-08 DIAGNOSIS — C785 Secondary malignant neoplasm of large intestine and rectum: Secondary | ICD-10-CM | POA: Insufficient documentation

## 2019-08-08 DIAGNOSIS — Z791 Long term (current) use of non-steroidal anti-inflammatories (NSAID): Secondary | ICD-10-CM | POA: Diagnosis not present

## 2019-08-08 DIAGNOSIS — Z7952 Long term (current) use of systemic steroids: Secondary | ICD-10-CM | POA: Diagnosis not present

## 2019-08-08 DIAGNOSIS — C5701 Malignant neoplasm of right fallopian tube: Secondary | ICD-10-CM | POA: Diagnosis not present

## 2019-08-08 DIAGNOSIS — E785 Hyperlipidemia, unspecified: Secondary | ICD-10-CM | POA: Insufficient documentation

## 2019-08-08 DIAGNOSIS — Z8349 Family history of other endocrine, nutritional and metabolic diseases: Secondary | ICD-10-CM | POA: Diagnosis not present

## 2019-08-08 DIAGNOSIS — G4730 Sleep apnea, unspecified: Secondary | ICD-10-CM | POA: Diagnosis not present

## 2019-08-08 MED ORDER — PROCHLORPERAZINE MALEATE 10 MG PO TABS: 10.0000 mg | ORAL_TABLET | Freq: Four times a day (QID) | ORAL | 1 refills | Status: DC | PRN

## 2019-08-08 MED ORDER — DEXAMETHASONE 4 MG PO TABS: ORAL_TABLET | ORAL | 6 refills | Status: DC

## 2019-08-08 MED ORDER — ONDANSETRON HCL 8 MG PO TABS: 8.0000 mg | ORAL_TABLET | Freq: Three times a day (TID) | ORAL | 1 refills | Status: DC | PRN

## 2019-08-08 MED FILL — DEXAMETHASONE 4 MG TABLET: 4 | 84 days supply | Qty: 16 | Fill #0

## 2019-08-08 MED FILL — ONDANSETRON HCL 8 MG TABLET: 8 | 10 days supply | Qty: 30 | Fill #0

## 2019-08-08 MED FILL — PROCHLORPERAZINE 10 MG TAB: 10 | 7 days supply | Qty: 30 | Fill #0

## 2019-08-08 NOTE — Telephone Encounter (Signed)
Called pt per 12/29 sch message - unable to reach pt .  Left message with apt date and times

## 2019-08-08 NOTE — Progress Notes (Signed)
START ON PATHWAY REGIMEN - Ovarian     A cycle is every 21 days:     Paclitaxel      Carboplatin   **Always confirm dose/schedule in your pharmacy ordering system**  Patient Characteristics: Recurrent or Progressive Disease, Second Line Therapy, Platinum Sensitive and ? 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

## 2019-08-09 ENCOUNTER — Encounter: Payer: Self-pay | Admitting: Hematology and Oncology

## 2019-08-09 NOTE — Assessment & Plan Note (Signed)
She understood the goals of care is palliative She nominated her daughter, Kristin Webb as a Special educational needs teacher of attorney We discussed CODE STATUS briefly and she is leaning towards DNR

## 2019-08-09 NOTE — Assessment & Plan Note (Signed)
She is at risk of severe hypoglycemia during treatment We discussed the importance of dietary modification while on treatment

## 2019-08-09 NOTE — Assessment & Plan Note (Signed)
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 The intent is of palliative intent.  We discussed some of the risks, benefits, side-effects of carboplatin & Taxol. Treatment is intravenous, every 3 weeks x 6 cycles  Some of the short term side-effects included, though not limited to, including weight loss, life threatening infections, risk of allergic reactions, need for transfusions of blood products, nausea, vomiting, change in bowel habits, loss of hair, admission to hospital for various reasons, and risks of death.   Long term side-effects are also discussed including risks of infertility, permanent damage to nerve function, hearing loss, chronic fatigue, kidney damage with possibility needing hemodialysis, and rare secondary malignancy including bone marrow disorders.  The patient is aware that the response rates discussed earlier is not guaranteed.  After a long discussion, patient made an informed decision to proceed with the prescribed plan of care.   Patient education material was dispensed. We discussed premedication with dexamethasone before chemotherapy. I do not plan prophylactic G-CSF support

## 2019-08-09 NOTE — Progress Notes (Signed)
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 female was transferred to my care after her prior physician has left.  She is considered a new patient as she has not been seen by oncologist for 3 years.  Her most recent follow-up was through GYN surgeon  I reviewed the patient's records extensive and collaborated the history with the patient. Summary of her history is as follows: Oncology History Overview Note  Oncologic Summary: 1. 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 Random CA 125 elevation January 2019  Question mesenteric nodules and anterior abdominal wall nodule 3. GeneDx Breast/Ovary Panel negative (including BRCA, MMR's, RAD51 etc) ? 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  Interval development of large mid abdominal mass highly concerning for right ovarian cancer.  There is mild omental nodularity on the left, and peritoneal disease cannot be completely excluded.  There is no ascites or other evidence of metastatic disease. 2.  Mild associated renal pelvocaliectasis bilaterally without obstruction.  Nonobstructing left renal calculus and a small right renal angiomyolipoma noted incidentally.   11/08/2012 Pathology Results   1. Ovary and fallopian tube, right - OVARIAN ATYPICAL PROLIFERATING MUCINOUS TUMOR (BORDERLINE TUMOR) (28 CM),  SEE COMMENT. 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 - BENIGN FALLOPIAN TUBE WITH NONSPECIFIC CHRONIC INFLAMMATION. 2. Ovary and fallopian tube, left - BENIGN OVARY; NEGATIVE FOR ATYPIA OR MALIGNANCY. - BENIGN FALLOPIAN TUBE; NEGATIVE FOR ATYPIA OR MALIGNANCY. 3. Omentum, resection for tumor - HIGH GRADE CARCINOMA, SEE COMMENT. 4. Appendix, Other than Incidental - FIBROUS OBLITERATION OF APPENDICEAL TIP. - NEGATIVE FOR MALIGNANCY.   11/08/2012 Surgery   Surgery: Exploratory laparotomy, bilateral salpingo-oophorectomy, appendectomy, infacolic omentectomy, optimal debulking  Surgeons:  Paola A. Alycia [facility], MD; Lahoma [facility], MD   Assistant: Caswell Corwin  Pathology: Bilateral fallopian tubes and ovaries to pathology. Appendix as well as omentum. 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  Operative findings: 25 cm right adnexal mass with smooth surface. Surgically absent uterus. Atrophic-appearing left ovary. Normal appearing appendix. Within the omentum there were centimeter nodules scattered throughout the omentum. The remainder of the surfaces were benign.   12/08/2012 Procedure   Impression:  Placement of a subcutaneous port device.  The catheter tip is in the lower SVC and ready to be used.    12/13/2012 - 03/28/2013 Chemotherapy   s/p 6 cycles of paclitaxel and carboplatin   12/13/2012 - 03/28/2013 Chemotherapy   The patient had 6 cycles of carboplatin and Taxol   03/24/2013 Imaging   US abdomen   04/24/2013 Imaging   CT abdomen Interval resection of the large right pelvic and lower abdominal mass lesion with apparent omentectomy.  No evidence for intraperitoneal free fluid on today's study.  No discernible peritoneal lesions.  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   09/07/2013 Genetic Testing   Patient has genetic testing done for  BRCA1/2 panel Results  revealed patient has no mutation(s):   07/09/2015 Imaging   CT abdomen 1. 12 mm obstructive calculus at the left ureteropelvic junction with moderate proximal hydronephrosis. 2. 2 small supraumbilical ventral hernias, one containing a short segment of the mid transverse colon and the other containing a short segment of the mid small bowel. There is no associated evidence to suggest bowel incarceration or obstruction at this time. 3. Tiny locule of gas non dependently in the lumen of the urinary bladder. This is presumably iatrogenic related to recent catheterization for urinalysis. Alternatively, this could be seen in the setting of urinary tract infection with gas-forming organisms. Clinical correlation for history of recent catheterization is recommended. 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 5. Status post cholecystectomy. 6. Additional incidental findings, as above.    12/11/2016 Imaging   Ct abdomen 1. No evidence of metastatic ovarian cancer. 2. Recurrent subxiphoid ventral abdominal wall hernia containing transverse colon. No evidence of incarceration or obstruction. 3. Stable incidental findings in the liver and kidneys. No recurrent urinary tract calculus. 4. Progressive lower lumbar spondylosis. 5.  Aortic Atherosclerosis (ICD10-I70.0).    08/30/2017 Imaging   MRI thoracic spine 1. At T5-6 there is a small central disc protrusion contacting the ventral thoracic spinal cord. No central canal or foraminal stenosis. 2. At T9-10 there is a small right paracentral disc protrusion. 3.  No acute osseous injury of the thoracic spine. 4. No aggressive osseous lesion to suggest metastatic disease.   09/24/2017 Tumor Marker   Patient's tumor was tested for the following markers: CA-125 Results of the tumor marker test revealed 21.7   09/30/2017 Imaging   CT abdomen 1. New small clustered soft tissue nodules in the left lower quadrant in the sigmoid mesentery,  largest 1.0 cm, which could represent recurrent peritoneal tumor implants. No ascites.  2. Midline high ventral abdominal wall hernia containing a portion of the transverse colon is mildly increased in size, and without bowel complication at this time. 3. Chronic findings include: Aortic Atherosclerosis (ICD10-I70.0). Diffuse hepatic steatosis. 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   10/11/2017 PET scan   1. Nodules in the sigmoid mesentery are hypermetabolic and highly worrisome for metastatic disease. 2. Attic steatosis.    11/03/2017 Procedure   Successful CT-guided rectus abdominal muscle mass core biopsy. Path: - FOREIGN BODY GIANT CELL REACTION INVOLVING FIBROADIPOSE TISSUE AND SKELETAL MUSCLE. - NO EVIDENCE OF MALIGNANCY.   11/04/2017 Cancer Staging   Staging form: Fallopian Tube, AJCC 7th Edition - Clinical: FIGO Stage IIIC, calculated as Stage IV (rT3, N0, M1) - Signed by [name], MD on 08/09/2019   02/2018 Imaging   CT: 1. Continued increase in size small peritoneal nodules along the mesenteric border of the proximal sigmoid colon as well as along the serosal surface of the proximal sigmoid colon. 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 2. No evidence of distant disease. 3. Stable large ventral hernia.   05/2018 Imaging   PET: 1. Redemonstration of hypermetabolic nodules within the sigmoid mesocolon. Mild response to therapy relative to CT of 02/24/2018. Mixed response to therapy compared to the most recent PET of 10/11/2017. 2. Hypermetabolism within the right pelvic rectus musculature, increased since the prior PET.  Clinical service requested comparison to the 11/24/2017 CT. Index 10 mm nodule within the sigmoid mesocolon was similar to the 02/24/2018 CT, and as described on that exam, increased from 7 mm  on 11/24/2017. More inferior nodule within the mesocolon measures 12 mm today  on image 156/4 and 8 mm on 11/24/2017.   05/2018 Imaging   CT: IMPRESSION: 1. Since 02/24/2018, decreased size of peritoneal nodules centered in the sigmoid mesocolon. 2. No evidence of new or progressive disease. 3. Hepatic steatosis. 4. Subcentimeter right renal angiomyolipoma, similar. 5.  Aortic Atherosclerosis (ICD10-I70.0). 6. Ventral abdominal wall laxity containing transverse colon, similar.   08/2018 Imaging   CT: IMPRESSION: 1. Nodules within the sigmoid mesocolon have decreased in size compared to prior. No new peritoneal or omental nodularity. 2. No evidence local recurrence the pelvis. 3. Ventral hernia contains a segment of transverse colon. No change from prior.     06/19/2019 Imaging   1. No evidence of metastatic disease in the abdomen pelvis. 2. No peritoneal or omental metastasis identified.  No free fluid. 3. Postcholecystectomy and hysterectomy. Comparison exams are made available. Comparison CT 09/08/2018 and 05/27/2018. PET-CT 05/27/2018    There is a nodule within the proximal aspect of the sigmoid colon measuring 2.2 by 2.2 cm. In comparison to prior CTs and PET-CT there was a hypermetabolic nodule at this location on the PET-CT of 08/27/2017 and on the CT of 09/08/2018 there was a small residual nodule. At that time (09/08/2018) the nodule measured 1.4 by 1.3 cm. Therefore this nodule has increased in the interval and concerning for recurrence of a serosal implant. The previous described lymph nodes in the sigmoid mesocolon and more central mesentery are not increased in size and not pathologic by size criteria.    Concern for recurrence of serosal metastasis in the proximal sigmoid colon with a 2 cm enlarging lesion. Lesion extends into the lumen. No evidence of high-grade obstruction. Consider FDG PET scan and/or potential colonoscopy for evaluation.   06/29/2019 PET scan   1. Enlarging serosal implant within the proximal sigmoid colon has intense metabolic  activity most consistent with malignancy. Lesion exhibits a somewhat indolent progression as present on PET-CT scan from 10/11/2017 and 05/27/2018. 2. Focal hypermetabolic activity within the RIGHT rectus muscle just off midline is slightly decreased from comparison exam. This may be benign inflammation related to prior laparotomy, however malignancy not excluded.   07/26/2019 Procedure   She had colonoscopy which showed multiple polyps.  6 polyps were removed from the cecum, measuring 3 to 6 mm in size.  One 12 mm polyp was removed from ascending colon and another 1 measures 7 mm.  One 5 mm polyp was removed from the transverse colon.  There is tumor noted in the sigmoid colon, approximately 35 cm from the anus which was biopsied.  The tumor appeared to be fungating, infiltrative and ulcerated but nonobstructive.  It encompassed approximately one third of the circumference of the lumen.   07/26/2019 Pathology Results   Multiple polyps came back tubular adenomas.  2 ascending polyps came back sessile serrated adenoma months.  Sigmoid colon biopsy confirmed metastatic high-grade serous carcinoma.  The morphology and immunophenotype are consistent with metastatic high-grade serous carcinoma from tubal/ovarian primary site.   08/17/2019 -  Chemotherapy   The patient had PALONOSETRON HCL INJECTION 0.25 MG/5ML, 0.25 mg, Intravenous,  Once, 0 of 6 cycles CARBOplatin (PARAPLATIN) in sodium chloride 0.9 % 100 mL chemo infusion, , Intravenous,  Once, 0 of 6 cycles FOSAPREPITANT IV INFUSION 150 MG, 150 mg, Intravenous,  Once, 0 of 6 cycles PACLitaxel (TAXOL) 336 mg in sodium chloride 0.9 % 500 mL chemo infusion (> 6m/m2), 175 mg/m2, Intravenous,  Once, 0 of 6 cycles  for chemotherapy treatment.     The patient have chronic changes in bowel habits since cholecystectomy.  Regular bowel habits is approximately 6-7 bowel movement per day.  With recent findings on colonoscopy, she denies rectal pain, changes in  the caliber of the stool or rectal bleeding.  She denies anorexia, nausea or sensation of bloating.  She has intermittent chronic back pain which is stable.  MEDICAL HISTORY:  Past Medical History:  Diagnosis Date  . Burning mouth syndrome   . Chronic fatigue   . Constipation   . Diabetes ([place]) 05/09/2018  . Diarrhea    in the past after gallbladder removal  . Fibromyalgia   . GERD (gastroesophageal reflux disease)   . History of kidney stones 07/2015  . Hyperlipidemia   . Hypertension    borderline not on meds   . Insomnia secondary to depression with anxiety   . Lumbar disc disease   . Ovarian cancer ([place]) 2014  . Pelvic mass in female   . Pneumonia    hx of pneumonia as an infant  . PONV (postoperative nausea and vomiting)    pain from gas hernia 2017  . Shortness of breath    with exertion   . Sleep apnea    uses CPAP  . Yeast infection     SURGICAL HISTORY: Past Surgical History:  Procedure Laterality Date  . ABDOMINAL HYSTERECTOMY     early 60s  . APPENDECTOMY     WITH DEBULKING/BSO  . CHOLECYSTECTOMY     early 43s  . CYSTOSCOPY W/ URETERAL STENT PLACEMENT Left 07/09/2015   DUE TO NEPHROLITHIASIS Procedure: CYSTOSCOPY WITH LEFT RETROGRADE PYELOGRAM/ LEFT URETERAL STENT PLACEMENT;  Surgeon: Ardis Hughs, MD;  Location: [facility];  Service: Urology;  Laterality: Left;  . HERNIA REPAIR    . INCISIONAL HERNIA REPAIR N/A 12/25/2015   WITH MESH Procedure:  INCISIONAL HERNIA REPAIR;  Surgeon: Ralene Ok, MD;  Location: [facility];  Service: General;  Laterality: N/A;  . INCISIONAL HERNIA REPAIR N/A 10/21/2018   Procedure: LAPAROSCOPIC INCISIONAL HERNIA REPAIR WITH MESH;  Surgeon: Ralene Ok, MD;  Location: [facility];  Service: General;  Laterality: N/A;  . INSERTION OF MESH N/A 12/25/2015   Procedure: INSERTION OF MESH;  Surgeon: Ralene Ok, MD;  Location: [facility];  Service: General;  Laterality: N/A;  . LAPAROTOMY Bilateral 11/08/2012   Procedure:  EXPLORATORY LAPAROTOMY BILATERAL SALPINGO OOPHORECTOMY TUMOR DEBULKING ;  Surgeon: Imagene Gurney A. Alycia Rossetti, MD;  Location: [facility];  Service: Gynecology;  Laterality: Bilateral;  APPENDECTOMY / OMENTECTOMY  . LAPAROTOMY N/A 12/25/2015   Procedure: EXPLORATORY LAPAROTOMY;  Surgeon: Ralene Ok, MD;  Location: [facility];  Service: General;  Laterality: N/A;  . LYSIS OF ADHESION N/A 12/25/2015   Procedure: LYSIS OF ADHESION;  Surgeon: Ralene Ok, MD;  Location: [facility];  Service: General;  Laterality: N/A;  . PARTIAL HYSTERECTOMY  2006  . TRIGGER FINGER RELEASE      SOCIAL HISTORY: Social History   Socioeconomic History  . Marital status: Widowed    Spouse name: [name]  . Number of children: 2  . Years of education: Master's  . Highest education level: Not on file  Occupational History  . Not on file  Tobacco Use  . Smoking status: Never Smoker  . Smokeless tobacco: Never Used  Substance and Sexual Activity  . Alcohol use: No  . Drug use: No  . Sexual activity: Not on file  Other Topics Concern  .  Not on file  Social History Narrative   Patient is married [name]). Husband passed away in 2014/10/15   Patient has 2 children by birth and 13 children all together.   Patient has a Oceanographer.   Patient is right-handed.   Patient drinks very little caffeine.         Social Determinants of Health   Financial Resource Strain:   . Difficulty of Paying Living Expenses: Not on file  Food Insecurity:   . Worried About Charity fundraiser in the Last Year: Not on file  . Ran Out of Food in the Last Year: Not on file  Transportation Needs:   . Lack of Transportation (Medical): Not on file  . Lack of Transportation (Non-Medical): Not on file  Physical Activity:   . Days of Exercise per Week: Not on file  . Minutes of Exercise per Session: Not on file  Stress:   . Feeling of Stress : Not on file  Social Connections:   . Frequency of Communication with Friends and Family: Not on file  .  Frequency of Social Gatherings with Friends and Family: Not on file  . Attends Religious Services: Not on file  . Active Member of Clubs or Organizations: Not on file  . Attends Archivist Meetings: Not on file  . Marital Status: Not on file  Intimate Partner Violence:   . Fear of Current or Ex-Partner: Not on file  . Emotionally Abused: Not on file  . Physically Abused: Not on file  . Sexually Abused: Not on file    FAMILY HISTORY: Family History  Problem Relation Age of Onset  . Lung cancer Mother 30  . High blood pressure Mother   . High Cholesterol Mother   . Lung cancer Father 58       2 ppd smoker  . Parkinson's disease Father   . Kidney disease Other     ALLERGIES:  is allergic to [employer] tartrate]; bee venom; and cortisone.  MEDICATIONS:  Current Outpatient Medications  Medication Sig Dispense Refill  . Armodafinil 250 MG tablet TAKE ONE TABLET BY MOUTH EVERY DAY (Patient taking differently: Take 250 mg by mouth every morning. ) 30 tablet 5  . Carboxymethylcellulose Sodium (EYE DROPS OP) Apply 2 drops to eye daily as needed (dry eye).    . cholecalciferol (VITAMIN D3) 25 MCG (1000 UT) tablet Take 1,000 Units by mouth daily.    . colestipol (COLESTID) 1 g tablet Take 0.5-1 g by mouth daily. Pt taking 1/2 to 1 tablet daily depending on meal choice; taken 1 hour after morning meds , and can not eat for another hour    . dexamethasone (DECADRON) 4 MG tablet Take 2 tabs at the night before and 2 tabs the morning of chemotherapy, every 3 weeks, by mouth x 6 cycles, please dispense 24 pills 24 tablet 6  . EPINEPHrine (EPIPEN 2-PAK) 0.3 mg/0.3 mL DEVI Inject 0.3 mg into the muscle once as needed (For bee stings.). Reported on 01/20/2016    . eszopiclone (LUNESTA) 2 MG TABS tablet TAKE ONE-HALF TO ONE TABLET BY MOUTH EVERY NIGHT AT BEDTIME AS NEEDED (Patient taking differently: Take 2 mg by mouth See admin instructions. TAKE  ONE TABLET BY MOUTH EVERY NIGHT AT  BEDTIME) 90 tablet 0  . FLUoxetine (PROZAC) 10 MG capsule Take 10 mg by mouth every morning. Takes with a 35m capsule for her total dose of 374m    . Marland KitchenLUoxetine (PROZAC) 20 MG capsule  Take 20 mg by mouth every morning. Takes with a 32m capsule for her total dose of 380m    . Marland Kitchenbuprofen (ADVIL,MOTRIN) 200 MG tablet Take 400-600 mg by mouth every 6 (six) hours as needed for mild pain or moderate pain.    . Marland Kitchenosartan-hydrochlorothiazide (HYZAAR) 100-12.5 MG tablet Take 1 tablet by mouth daily.     . Marland KitchenELATONIN PO Take 10 mg by mouth at bedtime.     . metFORMIN (GLUCOPHAGE-XR) 500 MG 24 hr tablet Take 500 mg by mouth daily with breakfast.   3  . modafinil (PROVIGIL) 200 MG tablet Take 1 tablet (200 mg total) by mouth every morning. (Patient not taking: Reported on 08/02/2019) 30 tablet 0  . Multiple Vitamin (MULTIVITAMIN WITH MINERALS) TABS Take 1 tablet by mouth every morning.     . naproxen sodium (ALEVE) 220 MG tablet Take 220 mg by mouth daily as needed (pain).     . ondansetron (ZOFRAN) 8 MG tablet Take 1 tablet (8 mg total) by mouth every 8 (eight) hours as needed for refractory nausea / vomiting. Start on day 3 after carboplatin chemo. 30 tablet 1  . prochlorperazine (COMPAZINE) 10 MG tablet Take 1 tablet (10 mg total) by mouth every 6 (six) hours as needed (Nausea or vomiting). 30 tablet 1  . rosuvastatin (CRESTOR) 20 MG tablet Take 20 mg by mouth every morning.      No current facility-administered medications for this visit.   Facility-Administered Medications Ordered in Other Visits  Medication Dose Route Frequency Provider Last Rate Last Admin  . sodium chloride flush (NS) 0.9 % injection 10 mL  10 mL Intravenous PRN GeNancy MarusMD   10 mL at 05/12/17 [id number]  REVIEW OF SYSTEMS:   Constitutional: Denies fevers, chills or abnormal night sweats Eyes: Denies blurriness of vision, double vision or watery eyes Ears, nose, mouth, throat, and face: Denies mucositis or sore  throat Respiratory: Denies cough, dyspnea or wheezes Cardiovascular: Denies palpitation, chest discomfort or lower extremity swelling Gastrointestinal:  Denies nausea, heartburn or change in bowel habits Skin: Denies abnormal skin rashes Lymphatics: Denies new lymphadenopathy or easy bruising Neurological:Denies numbness, tingling or new weaknesses Behavioral/Psych: Mood is stable, no new changes  All other systems were reviewed with the patient and are negative.  PHYSICAL EXAMINATION: ECOG PERFORMANCE STATUS: 1 - Symptomatic but completely ambulatory  Vitals:   08/08/19 0946  BP: 135/80  Pulse: 87  Resp: 17  Temp: 98.7 F (37.1 C)  SpO2: 100%   Filed Weights   08/08/19 0946  Weight: 185 lb 12.8 oz (84.3 kg)    GENERAL:alert, no distress and comfortable SKIN: skin color, texture, turgor are normal, no rashes or significant lesions EYES: normal, conjunctiva are pink and non-injected, sclera clear OROPHARYNX:no exudate, normal lips, buccal mucosa, and tongue  NECK: supple, thyroid normal size, non-tender, without nodularity LYMPH:  no palpable lymphadenopathy in the cervical, axillary or inguinal LUNGS: clear to auscultation and percussion with normal breathing effort HEART: regular rate & rhythm and no murmurs without lower extremity edema ABDOMEN:abdomen soft, non-tender and normal bowel sounds Musculoskeletal:no cyanosis of digits and no clubbing  PSYCH: alert & oriented x 3 with fluent speech NEURO: no focal motor/sensory deficits  LABORATORY DATA:  I have reviewed the data as listed Lab Results  Component Value Date   WBC 5.6 10/17/2018   HGB 14.8 10/17/2018   HCT 47.4 (H) 10/17/2018   MCV 91.5 10/17/2018   PLT 165 10/17/2018   Recent Labs  08/23/18 1221 10/17/18 0829 06/16/19 0950  NA 143 137 140  K 4.0 3.6 3.7  CL 106 103 103  CO2 27 24 25   GLUCOSE 114* 104* 104*  BUN 12 15 14   CREATININE 0.72 0.59 0.67  CALCIUM 9.9 9.5 9.3  GFRNONAA >60 >60 >60   GFRAA >60 >60 >60  PROT  --   --  7.1  ALBUMIN  --   --  4.2  AST  --   --  24  ALT  --   --  30  ALKPHOS  --   --  108  BILITOT  --   --  0.3    RADIOGRAPHIC STUDIES: I have reviewed her most recent PET scan and CT imaging I have personally reviewed the radiological images as listed and agreed with the findings in the report.  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 The intent is of palliative intent.  We discussed some of the risks, benefits, side-effects of carboplatin & Taxol. Treatment is intravenous, every 3 weeks x 6 cycles  Some of the short term side-effects included, though not limited to, including weight loss, life threatening infections, risk of allergic reactions, need for transfusions of blood products, nausea, vomiting, change in bowel habits, loss of hair, admission to hospital for various reasons, and risks of death.   Long term side-effects are also discussed including risks of infertility, permanent damage to nerve function, hearing loss, chronic fatigue, kidney damage with possibility needing hemodialysis, and rare secondary malignancy including bone marrow disorders.  The patient is aware that the response rates discussed earlier is not guaranteed.  After a long discussion, patient made an informed decision to proceed with the prescribed plan of care.   Patient education material was dispensed. We discussed premedication with dexamethasone before chemotherapy. I do not plan prophylactic G-CSF support  Newly diagnosed diabetes ([place]) She is at risk of severe hypoglycemia during treatment We discussed  the importance of dietary modification while on treatment  Goals of care, counseling/discussion She understood the goals of care is palliative She nominated her daughter, Arbie Cookey as a Special educational needs teacher of attorney We discussed CODE STATUS briefly and she is leaning towards DNR   Orders Placed This Encounter  Procedures  . CBC with Differential (Cancer Center Only)    Standing Status:   Standing    Number of Occurrences:   20    Standing Expiration Date:   08/07/2020  . CMP ([facility] only)    Standing Status:   Standing    Number of Occurrences:   20    Standing Expiration Date:   08/07/2020    All questions were answered. The patient knows to call the clinic with any problems, questions or concerns. I spent 55 minutes counseling the patient face to face. The total time spent in the appointment was 60 minutes and more than 50% was on counseling.     [name], MD 08/09/2019 8:01 AM

## 2019-08-10 ENCOUNTER — Telehealth: Payer: Self-pay

## 2019-08-10 ENCOUNTER — Encounter (HOSPITAL_COMMUNITY): Payer: Medicare HMO

## 2019-08-10 DIAGNOSIS — H109 Unspecified conjunctivitis: Secondary | ICD-10-CM | POA: Diagnosis not present

## 2019-08-10 NOTE — Telephone Encounter (Signed)
She called and left a message to called to call her.  Called back. She is asking what her chemo treatment would be? Given names of drugs. She verbalized understanding.

## 2019-08-14 ENCOUNTER — Ambulatory Visit: Payer: Medicare HMO | Admitting: Gynecologic Oncology

## 2019-08-16 ENCOUNTER — Ambulatory Visit
Admission: RE | Admit: 2019-08-16 | Discharge: 2019-08-16 | Disposition: A | Discharge status: Home / Self Care | Payer: Medicare HMO | Source: Ambulatory Visit | Attending: Family Medicine | Admitting: Family Medicine

## 2019-08-16 ENCOUNTER — Other Ambulatory Visit: Payer: Self-pay

## 2019-08-16 DIAGNOSIS — Z780 Asymptomatic menopausal state: Secondary | ICD-10-CM | POA: Diagnosis not present

## 2019-08-16 DIAGNOSIS — M8580 Other specified disorders of bone density and structure, unspecified site: Secondary | ICD-10-CM

## 2019-08-16 DIAGNOSIS — Z1231 Encounter for screening mammogram for malignant neoplasm of breast: Secondary | ICD-10-CM

## 2019-08-16 DIAGNOSIS — M85852 Other specified disorders of bone density and structure, left thigh: Secondary | ICD-10-CM | POA: Diagnosis not present

## 2019-08-17 ENCOUNTER — Inpatient Hospital Stay (HOSPITAL_BASED_OUTPATIENT_CLINIC_OR_DEPARTMENT_OTHER): Payer: Medicare HMO | Admitting: Hematology and Oncology

## 2019-08-17 ENCOUNTER — Encounter (HOSPITAL_COMMUNITY): Admission: RE | Payer: Self-pay | Source: Home / Self Care

## 2019-08-17 ENCOUNTER — Inpatient Hospital Stay: Payer: Medicare HMO | Attending: Gynecologic Oncology

## 2019-08-17 ENCOUNTER — Other Ambulatory Visit: Payer: Self-pay

## 2019-08-17 ENCOUNTER — Encounter: Payer: Self-pay | Admitting: Hematology and Oncology

## 2019-08-17 ENCOUNTER — Inpatient Hospital Stay (HOSPITAL_COMMUNITY): Admission: RE | Admit: 2019-08-17 | Payer: Medicare HMO | Source: Home / Self Care | Admitting: Gynecologic Oncology

## 2019-08-17 ENCOUNTER — Other Ambulatory Visit: Payer: Self-pay | Admitting: Family Medicine

## 2019-08-17 ENCOUNTER — Inpatient Hospital Stay: Payer: Medicare HMO

## 2019-08-17 VITALS — BP 140/79 | HR 84 | Temp 98.9°F | Resp 17 | Wt 184.0 lb

## 2019-08-17 DIAGNOSIS — Z791 Long term (current) use of non-steroidal anti-inflammatories (NSAID): Secondary | ICD-10-CM | POA: Diagnosis not present

## 2019-08-17 DIAGNOSIS — C5700 Malignant neoplasm of unspecified fallopian tube: Secondary | ICD-10-CM | POA: Diagnosis not present

## 2019-08-17 DIAGNOSIS — Z7189 Other specified counseling: Secondary | ICD-10-CM

## 2019-08-17 DIAGNOSIS — Z79899 Other long term (current) drug therapy: Secondary | ICD-10-CM | POA: Diagnosis not present

## 2019-08-17 DIAGNOSIS — Z7952 Long term (current) use of systemic steroids: Secondary | ICD-10-CM | POA: Diagnosis not present

## 2019-08-17 DIAGNOSIS — M797 Fibromyalgia: Secondary | ICD-10-CM | POA: Insufficient documentation

## 2019-08-17 DIAGNOSIS — R928 Other abnormal and inconclusive findings on diagnostic imaging of breast: Secondary | ICD-10-CM

## 2019-08-17 DIAGNOSIS — C5701 Malignant neoplasm of right fallopian tube: Secondary | ICD-10-CM

## 2019-08-17 DIAGNOSIS — I700 Atherosclerosis of aorta: Secondary | ICD-10-CM | POA: Diagnosis not present

## 2019-08-17 DIAGNOSIS — Z5111 Encounter for antineoplastic chemotherapy: Secondary | ICD-10-CM | POA: Diagnosis not present

## 2019-08-17 DIAGNOSIS — C785 Secondary malignant neoplasm of large intestine and rectum: Secondary | ICD-10-CM | POA: Diagnosis not present

## 2019-08-17 DIAGNOSIS — K760 Fatty (change of) liver, not elsewhere classified: Secondary | ICD-10-CM | POA: Insufficient documentation

## 2019-08-17 DIAGNOSIS — C786 Secondary malignant neoplasm of retroperitoneum and peritoneum: Secondary | ICD-10-CM | POA: Insufficient documentation

## 2019-08-17 DIAGNOSIS — E119 Type 2 diabetes mellitus without complications: Secondary | ICD-10-CM | POA: Insufficient documentation

## 2019-08-17 DIAGNOSIS — M8580 Other specified disorders of bone density and structure, unspecified site: Secondary | ICD-10-CM | POA: Diagnosis not present

## 2019-08-17 DIAGNOSIS — Z7984 Long term (current) use of oral hypoglycemic drugs: Secondary | ICD-10-CM | POA: Diagnosis not present

## 2019-08-17 LAB — CBC WITH DIFFERENTIAL (CANCER CENTER ONLY)
Abs Immature Granulocytes: 0.02 10*3/uL (ref 0.00–0.07)
Basophils Absolute: 0 10*3/uL (ref 0.0–0.1)
Basophils Relative: 0 %
Eosinophils Absolute: 0 10*3/uL (ref 0.0–0.5)
Eosinophils Relative: 0 %
HCT: 42.8 % (ref 36.0–46.0)
Hemoglobin: 14.1 g/dL (ref 12.0–15.0)
Immature Granulocytes: 0 %
Lymphocytes Relative: 13 %
Lymphs Abs: 0.7 10*3/uL (ref 0.7–4.0)
MCH: 29.8 pg (ref 26.0–34.0)
MCHC: 32.9 g/dL (ref 30.0–36.0)
MCV: 90.5 fL (ref 80.0–100.0)
Monocytes Absolute: 0.1 10*3/uL (ref 0.1–1.0)
Monocytes Relative: 2 %
Neutro Abs: 4.5 10*3/uL (ref 1.7–7.7)
Neutrophils Relative %: 85 %
Platelet Count: 186 10*3/uL (ref 150–400)
RBC: 4.73 MIL/uL (ref 3.87–5.11)
RDW: 12.7 % (ref 11.5–15.5)
WBC Count: 5.3 10*3/uL (ref 4.0–10.5)
nRBC: 0 % (ref 0.0–0.2)

## 2019-08-17 LAB — CMP (CANCER CENTER ONLY)
ALT: 32 U/L (ref 0–44)
AST: 21 U/L (ref 15–41)
Albumin: 4.4 g/dL (ref 3.5–5.0)
Alkaline Phosphatase: 132 U/L — ABNORMAL HIGH (ref 38–126)
Anion gap: 12 (ref 5–15)
BUN: 25 mg/dL — ABNORMAL HIGH (ref 8–23)
CO2: 27 mmol/L (ref 22–32)
Calcium: 9.3 mg/dL (ref 8.9–10.3)
Chloride: 103 mmol/L (ref 98–111)
Creatinine: 0.83 mg/dL (ref 0.44–1.00)
GFR, Est AFR Am: >60 mL/min (ref >60)
GFR, Estimated: >60 mL/min (ref >60)
Glucose, Bld: 145 mg/dL — ABNORMAL HIGH (ref 70–99)
Potassium: 3.9 mmol/L (ref 3.5–5.1)
Sodium: 142 mmol/L (ref 135–145)
Total Bilirubin: 0.3 mg/dL (ref 0.3–1.2)
Total Protein: 7.7 g/dL (ref 6.5–8.1)

## 2019-08-17 SURGERY — COLON RESECTION
Anesthesia: General

## 2019-08-17 MED ORDER — FAMOTIDINE IN NACL 20-0.9 MG/50ML-% IV SOLN
20.0000 mg | Freq: Once | INTRAVENOUS | Status: AC
  Administered 2019-08-17: 20 mg via INTRAVENOUS

## 2019-08-17 MED ORDER — PALONOSETRON HCL INJECTION 0.25 MG/5ML
0.2500 mg | Freq: Once | INTRAVENOUS | Status: AC
  Administered 2019-08-17: 0.25 mg via INTRAVENOUS

## 2019-08-17 MED ORDER — DIPHENHYDRAMINE HCL 50 MG/ML IJ SOLN
50.0000 mg | Freq: Once | INTRAMUSCULAR | Status: AC
  Administered 2019-08-17: 50 mg via INTRAVENOUS

## 2019-08-17 MED ORDER — HEPARIN SOD (PORK) LOCK FLUSH 100 UNIT/ML IV SOLN
500.0000 [IU] | Freq: Once | INTRAVENOUS | Status: AC | PRN
  Administered 2019-08-17: 500 [IU]
  Filled 2019-08-17: qty 5

## 2019-08-17 MED ORDER — DEXAMETHASONE SODIUM PHOSPHATE 10 MG/ML IJ SOLN
10.0000 mg | Freq: Once | INTRAMUSCULAR | Status: AC
  Administered 2019-08-17: 10 mg via INTRAVENOUS

## 2019-08-17 MED ORDER — SODIUM CHLORIDE 0.9 % IV SOLN
Freq: Once | INTRAVENOUS | Status: AC
  Filled 2019-08-17: qty 250

## 2019-08-17 MED ORDER — DIPHENHYDRAMINE HCL 50 MG/ML IJ SOLN
INTRAMUSCULAR | Status: AC
  Filled 2019-08-17: qty 1

## 2019-08-17 MED ORDER — SODIUM CHLORIDE 0.9 % IV SOLN
556.2000 mg | Freq: Once | INTRAVENOUS | Status: AC
  Administered 2019-08-17: 560 mg via INTRAVENOUS
  Filled 2019-08-17: qty 56

## 2019-08-17 MED ORDER — SODIUM CHLORIDE 0.9% FLUSH
10.0000 mL | INTRAVENOUS | Status: DC | PRN
  Administered 2019-08-17: 10 mL
  Filled 2019-08-17: qty 10

## 2019-08-17 MED ORDER — FAMOTIDINE IN NACL 20-0.9 MG/50ML-% IV SOLN
INTRAVENOUS | Status: AC
  Filled 2019-08-17: qty 50

## 2019-08-17 MED ORDER — PALONOSETRON HCL INJECTION 0.25 MG/5ML
INTRAVENOUS | Status: AC
  Filled 2019-08-17: qty 5

## 2019-08-17 MED ORDER — SODIUM CHLORIDE 0.9 % IV SOLN
175.0000 mg/m2 | Freq: Once | INTRAVENOUS | Status: AC
  Administered 2019-08-17: 336 mg via INTRAVENOUS
  Filled 2019-08-17: qty 56

## 2019-08-17 MED ORDER — SODIUM CHLORIDE 0.9 % IV SOLN
150.0000 mg | Freq: Once | INTRAVENOUS | Status: AC
  Administered 2019-08-17: 150 mg via INTRAVENOUS
  Filled 2019-08-17: qty 5

## 2019-08-17 MED ORDER — SODIUM CHLORIDE 0.9 % IV SOLN: 750.0000 mg | Freq: Once | INTRAVENOUS | Status: DC

## 2019-08-17 MED ORDER — SODIUM CHLORIDE 0.9% FLUSH
10.0000 mL | Freq: Once | INTRAVENOUS | Status: AC
  Administered 2019-08-17: 10 mL
  Filled 2019-08-17: qty 10

## 2019-08-17 MED ORDER — DEXAMETHASONE SODIUM PHOSPHATE 10 MG/ML IJ SOLN
INTRAMUSCULAR | Status: AC
  Filled 2019-08-17: qty 1

## 2019-08-17 NOTE — Patient Instructions (Signed)

## 2019-08-17 NOTE — Progress Notes (Signed)
Per Dr. Alvy Bimler, carboplatin dose today is to be 560mg  (AUC=6).   Demetrius Charity, PharmD, [facility] Phone: [phone] 08/17/2019

## 2019-08-17 NOTE — Patient Instructions (Signed)
[facility] Discharge Instructions for Patients Receiving Chemotherapy  Today you received the following chemotherapy agents: Taxol and Carboplatin.  To help prevent nausea and vomiting after your treatment, we encourage you to take your nausea medication as directed.   If you develop nausea and vomiting that is not controlled by your nausea medication, call the clinic.   BELOW ARE SYMPTOMS THAT SHOULD BE REPORTED IMMEDIATELY:  *FEVER GREATER THAN 100.5 F  *CHILLS WITH OR WITHOUT FEVER  NAUSEA AND VOMITING THAT IS NOT CONTROLLED WITH YOUR NAUSEA MEDICATION  *UNUSUAL SHORTNESS OF BREATH  *UNUSUAL BRUISING OR BLEEDING  TENDERNESS IN MOUTH AND THROAT WITH OR WITHOUT PRESENCE OF ULCERS  *URINARY PROBLEMS  *BOWEL PROBLEMS  UNUSUAL RASH Items with * indicate a potential emergency and should be followed up as soon as possible.  Feel free to call the clinic should you have any questions or concerns. The clinic phone number is [phone].  Please show the CHEMO ALERT CARD at check-in to the Emergency Department and triage nurse.  Paclitaxel injection What is this medicine? PACLITAXEL (PAK li TAX el) is a chemotherapy drug. It targets fast dividing cells, like cancer cells, and causes these cells to die. This medicine is used to treat ovarian cancer, breast cancer, lung cancer, Kaposi's sarcoma, and other cancers. This medicine may be used for other purposes; ask your health care provider or pharmacist if you have questions. COMMON BRAND NAME(S): Onxol, Taxol What should I tell my health care provider before I take this medicine? They need to know if you have any of these conditions:  history of irregular heartbeat  liver disease  low blood counts, like low white cell, platelet, or red cell counts  lung or breathing disease, like asthma  tingling of the fingers or toes, or other nerve disorder  an unusual or allergic reaction to paclitaxel, alcohol,  polyoxyethylated castor oil, other chemotherapy, other medicines, foods, dyes, or preservatives  pregnant or trying to get pregnant  breast-feeding How should I use this medicine? This drug is given as an infusion into a vein. It is administered in a hospital or clinic by a specially trained health care professional. Talk to your pediatrician regarding the use of this medicine in children. Special care may be needed. Overdosage: If you think you have taken too much of this medicine contact a poison control center or emergency room at once. NOTE: This medicine is only for you. Do not share this medicine with others. What if I miss a dose? It is important not to miss your dose. Call your doctor or health care professional if you are unable to keep an appointment. What may interact with this medicine? Do not take this medicine with any of the following medications:  disulfiram  metronidazole This medicine may also interact with the following medications:  antiviral medicines for hepatitis, HIV or AIDS  certain antibiotics like erythromycin and clarithromycin  certain medicines for fungal infections like ketoconazole and itraconazole  certain medicines for seizures like carbamazepine, phenobarbital, phenytoin  gemfibrozil  nefazodone  rifampin  St. John's wort This list may not describe all possible interactions. Give your health care provider a list of all the medicines, herbs, non-prescription drugs, or dietary supplements you use. Also tell them if you smoke, drink alcohol, or use illegal drugs. Some items may interact with your medicine. What should I watch for while using this medicine? Your condition will be monitored carefully while you are receiving this medicine. You will need important   blood work done while you are taking this medicine. This medicine can cause serious allergic reactions. To reduce your risk you will need to take other medicine(s) before treatment with this  medicine. If you experience allergic reactions like skin rash, itching or hives, swelling of the face, lips, or tongue, tell your doctor or health care professional right away. In some cases, you may be given additional medicines to help with side effects. Follow all directions for their use. This drug may make you feel generally unwell. This is not uncommon, as chemotherapy can affect healthy cells as well as cancer cells. Report any side effects. Continue your course of treatment even though you feel ill unless your doctor tells you to stop. Call your doctor or health care professional for advice if you get a fever, chills or sore throat, or other symptoms of a cold or flu. Do not treat yourself. This drug decreases your body's ability to fight infections. Try to avoid being around people who are sick. This medicine may increase your risk to bruise or bleed. Call your doctor or health care professional if you notice any unusual bleeding. Be careful brushing and flossing your teeth or using a toothpick because you may get an infection or bleed more easily. If you have any dental work done, tell your dentist you are receiving this medicine. Avoid taking products that contain aspirin, acetaminophen, ibuprofen, naproxen, or ketoprofen unless instructed by your doctor. These medicines may hide a fever. Do not become pregnant while taking this medicine. Women should inform their doctor if they wish to become pregnant or think they might be pregnant. There is a potential for serious side effects to an unborn child. Talk to your health care professional or pharmacist for more information. Do not breast-feed an infant while taking this medicine. Men are advised not to father a child while receiving this medicine. This product may contain alcohol. Ask your pharmacist or healthcare provider if this medicine contains alcohol. Be sure to tell all healthcare providers you are taking this medicine. Certain medicines,  like metronidazole and disulfiram, can cause an unpleasant reaction when taken with alcohol. The reaction includes flushing, headache, nausea, vomiting, sweating, and increased thirst. The reaction can last from 30 minutes to several hours. What side effects may I notice from receiving this medicine? Side effects that you should report to your doctor or health care professional as soon as possible:  allergic reactions like skin rash, itching or hives, swelling of the face, lips, or tongue  breathing problems  changes in vision  fast, irregular heartbeat  high or low blood pressure  mouth sores  pain, tingling, numbness in the hands or feet  signs of decreased platelets or bleeding - bruising, pinpoint red spots on the skin, black, tarry stools, blood in the urine  signs of decreased red blood cells - unusually weak or tired, feeling faint or lightheaded, falls  signs of infection - fever or chills, cough, sore throat, pain or difficulty passing urine  signs and symptoms of liver injury like dark yellow or brown urine; general ill feeling or flu-like symptoms; light-colored stools; loss of appetite; nausea; right upper belly pain; unusually weak or tired; yellowing of the eyes or skin  swelling of the ankles, feet, hands  unusually slow heartbeat Side effects that usually do not require medical attention (report to your doctor or health care professional if they continue or are bothersome):  diarrhea  hair loss  loss of appetite  muscle or joint   pain  nausea, vomiting  pain, redness, or irritation at site where injected  tiredness This list may not describe all possible side effects. Call your doctor for medical advice about side effects. You may report side effects to FDA at 1-800-FDA-1088. Where should I keep my medicine? This drug is given in a hospital or clinic and will not be stored at home. NOTE: This sheet is a summary. It may not cover all possible information.  If you have questions about this medicine, talk to your doctor, pharmacist, or health care provider.  2020 Elsevier/Gold Standard (2017-03-30 13:14:55) Carboplatin injection What is this medicine? CARBOPLATIN (KAR boe pla tin) is a chemotherapy drug. It targets fast dividing cells, like cancer cells, and causes these cells to die. This medicine is used to treat ovarian cancer and many other cancers. This medicine may be used for other purposes; ask your health care provider or pharmacist if you have questions. COMMON BRAND NAME(S): Paraplatin What should I tell my health care provider before I take this medicine? They need to know if you have any of these conditions:  blood disorders  hearing problems  kidney disease  recent or ongoing radiation therapy  an unusual or allergic reaction to carboplatin, cisplatin, other chemotherapy, other medicines, foods, dyes, or preservatives  pregnant or trying to get pregnant  breast-feeding How should I use this medicine? This drug is usually given as an infusion into a vein. It is administered in a hospital or clinic by a specially trained health care professional. Talk to your pediatrician regarding the use of this medicine in children. Special care may be needed. Overdosage: If you think you have taken too much of this medicine contact a poison control center or emergency room at once. NOTE: This medicine is only for you. Do not share this medicine with others. What if I miss a dose? It is important not to miss a dose. Call your doctor or health care professional if you are unable to keep an appointment. What may interact with this medicine?  medicines for seizures  medicines to increase blood counts like filgrastim, pegfilgrastim, sargramostim  some antibiotics like amikacin, gentamicin, neomycin, streptomycin, tobramycin  vaccines Talk to your doctor or health care professional before taking any of these  medicines:  acetaminophen  aspirin  ibuprofen  ketoprofen  naproxen This list may not describe all possible interactions. Give your health care provider a list of all the medicines, herbs, non-prescription drugs, or dietary supplements you use. Also tell them if you smoke, drink alcohol, or use illegal drugs. Some items may interact with your medicine. What should I watch for while using this medicine? Your condition will be monitored carefully while you are receiving this medicine. You will need important blood work done while you are taking this medicine. This drug may make you feel generally unwell. This is not uncommon, as chemotherapy can affect healthy cells as well as cancer cells. Report any side effects. Continue your course of treatment even though you feel ill unless your doctor tells you to stop. In some cases, you may be given additional medicines to help with side effects. Follow all directions for their use. Call your doctor or health care professional for advice if you get a fever, chills or sore throat, or other symptoms of a cold or flu. Do not treat yourself. This drug decreases your body's ability to fight infections. Try to avoid being around people who are sick. This medicine may increase your risk to bruise or   bleed. Call your doctor or health care professional if you notice any unusual bleeding. Be careful brushing and flossing your teeth or using a toothpick because you may get an infection or bleed more easily. If you have any dental work done, tell your dentist you are receiving this medicine. Avoid taking products that contain aspirin, acetaminophen, ibuprofen, naproxen, or ketoprofen unless instructed by your doctor. These medicines may hide a fever. Do not become pregnant while taking this medicine. Women should inform their doctor if they wish to become pregnant or think they might be pregnant. There is a potential for serious side effects to an unborn child. Talk  to your health care professional or pharmacist for more information. Do not breast-feed an infant while taking this medicine. What side effects may I notice from receiving this medicine? Side effects that you should report to your doctor or health care professional as soon as possible:  allergic reactions like skin rash, itching or hives, swelling of the face, lips, or tongue  signs of infection - fever or chills, cough, sore throat, pain or difficulty passing urine  signs of decreased platelets or bleeding - bruising, pinpoint red spots on the skin, black, tarry stools, nosebleeds  signs of decreased red blood cells - unusually weak or tired, fainting spells, lightheadedness  breathing problems  changes in hearing  changes in vision  chest pain  high blood pressure  low blood counts - This drug may decrease the number of white blood cells, red blood cells and platelets. You may be at increased risk for infections and bleeding.  nausea and vomiting  pain, swelling, redness or irritation at the injection site  pain, tingling, numbness in the hands or feet  problems with balance, talking, walking  trouble passing urine or change in the amount of urine Side effects that usually do not require medical attention (report to your doctor or health care professional if they continue or are bothersome):  hair loss  loss of appetite  metallic taste in the mouth or changes in taste This list may not describe all possible side effects. Call your doctor for medical advice about side effects. You may report side effects to FDA at 1-800-FDA-1088. Where should I keep my medicine? This drug is given in a hospital or clinic and will not be stored at home. NOTE: This sheet is a summary. It may not cover all possible information. If you have questions about this medicine, talk to your doctor, pharmacist, or health care provider.  2020 Elsevier/Gold Standard (2007-11-01 14:38:05)  

## 2019-08-17 NOTE — Assessment & Plan Note (Signed)
She have several questions regarding informed consent to proceed with therapy today We discussed the rationale of prescribing combination carboplatin with paclitaxel versus single agent I did not go into details about other treatment options because combination treatment with carboplatin and paclitaxel is considered the best treatment out there and she is in agreement to proceed We briefly discussed side effects We discussed the risk and benefits of dexamethasone including risk of insomnia, fluid retention and hyperglycemia In terms of chemotherapy, we discussed the risk of hair loss, pancytopenia, risk of infection, changes in bowel habits and peripheral neuropathy I have addressed all her questions to the best of my ability and she has no further questions or concerns

## 2019-08-17 NOTE — Progress Notes (Signed)
Met with patient in lobby to introduce myself as Arboriculturist and to offer available resources.  Discussed one-time $56 [facility] and qualifications to assist with personal expenses while going through treatment. Based on verbal income guidelines provided, patient states she is over the income.  Gave her my card for any additional financial questions or concerns.

## 2019-08-17 NOTE — Progress Notes (Signed)
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[facility]) She have several questions regarding informed consent to proceed with therapy today We discussed the rationale of prescribing combination carboplatin with paclitaxel versus single agent I did not go into details about other treatment options because combination treatment with carboplatin and paclitaxel is considered the best treatment out there and she is in agreement to proceed We briefly discussed side effects We discussed the risk and benefits of dexamethasone including risk of insomnia, fluid retention and hyperglycemia In terms of chemotherapy, we discussed the risk of hair loss, pancytopenia, risk of infection, changes in bowel habits and peripheral neuropathy I have addressed all her questions to the best of my ability and she has no further questions or concerns  Goals of care, counseling/discussion She have questions about recent mammogram result Due to ongoing chemotherapy, I recommend delaying her mammogram until after completion of treatment She is not symptomatic I also reviewed the bone density scan per patient request At present time, I felt that she have osteopenia but not osteoporosis I reassured the patient   No orders of the defined types were placed in this encounter.   All questions were answered. The patient knows to call the clinic with any problems, questions or concerns. The total time spent in the appointment was 20 minutes encounter with patients including review of chart and various tests results, discussions about plan of care and coordination of care plan   Kristin Lark, MD 08/17/2019 1:52 PM  INTERVAL HISTORY: Please see below for problem oriented charting. She is seen in the infusion room per patient request to review final  recommendation, and informed consent before she proceed with treatment I saw her recently for recurrent ovarian cancer The plan will be to proceed with combination treatment with carboplatin and paclitaxel based on good response to treatment in the platinum sensitive situation. The patient did not recall is having a good discussion about the risks, benefits, alternatives to treatment plan She pointed out several lines of question as outlined in the informed consent form which I have subsequently answered to the best of my ability She also had recent mammogram done.  The mammographer felt that due to dense breast tissue, recommended her to return next week for further additional imaging study.  She have no symptoms.  She also underwent bone density screening recently.  She was told she had bad bone density.  She requested further clarification of the result of her recent bone density.  SUMMARY OF ONCOLOGIC HISTORY: Oncology History Overview Note  Oncologic Summary: 1. 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 Random CA 125 elevation January 2019  Question mesenteric nodules and anterior abdominal wall nodule 3. [employer] Breast/Ovary Panel negative (including BRCA, MMR's, RAD51 etc) ? [facility] BRACAnalysis  Negative for BRCA1/2 in tumor   Fallopian tube carcinoma ([place])  11/01/2012 Imaging   Ct abdomen 1.  Interval development of large mid abdominal mass highly concerning for right ovarian cancer.  There is mild omental nodularity on the left, and peritoneal disease cannot be completely excluded.  There is no ascites or other evidence of metastatic disease. 2.  Mild associated renal  pelvocaliectasis bilaterally without obstruction.  Nonobstructing left renal  calculus and a small right renal angiomyolipoma noted incidentally.   11/08/2012 Pathology Results   1. 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 - BENIGN FALLOPIAN TUBE WITH NONSPECIFIC CHRONIC INFLAMMATION. 2. Ovary and fallopian tube, left - BENIGN OVARY; NEGATIVE FOR ATYPIA OR MALIGNANCY. - BENIGN FALLOPIAN TUBE; NEGATIVE FOR ATYPIA OR MALIGNANCY. 3. Omentum, resection for tumor - HIGH GRADE CARCINOMA, SEE COMMENT. 4. Appendix, Other than Incidental - FIBROUS OBLITERATION OF APPENDICEAL TIP. - NEGATIVE FOR MALIGNANCY.   11/08/2012 Surgery   Surgery: Exploratory laparotomy, bilateral salpingo-oophorectomy, appendectomy, infacolic omentectomy, optimal debulking  Surgeons:  Paola A. Alycia Rossetti, MD; Lahoma Crocker, MD   Assistant: Caswell Corwin  Pathology: Bilateral fallopian tubes and ovaries to pathology. Appendix as well as omentum. 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  Operative findings: 25 cm right adnexal mass with smooth surface. Surgically absent uterus. Atrophic-appearing left ovary. Normal appearing appendix. Within the omentum there were centimeter nodules scattered throughout the omentum. The remainder of the surfaces were benign.   12/08/2012 Procedure   Impression:  Placement of a subcutaneous port device.  The catheter tip is in the lower SVC and ready to be used.    12/13/2012 - 03/28/2013 Chemotherapy   s/p 6 cycles of paclitaxel and carboplatin   12/13/2012 - 03/28/2013 Chemotherapy   The patient had 6 cycles of carboplatin and Taxol   03/24/2013 Imaging   US abdomen   04/24/2013 Imaging   CT abdomen Interval resection of the large right pelvic and lower abdominal mass lesion with apparent omentectomy.  No evidence for  intraperitoneal free fluid on today's study.  No discernible peritoneal lesions.  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   09/07/2013 Genetic Testing   Patient has genetic testing done for BRCA1/2 panel Results revealed patient has no mutation(s):   07/09/2015 Imaging   CT abdomen 1. 12 mm obstructive calculus at the left ureteropelvic junction with moderate proximal hydronephrosis. 2. 2 small supraumbilical ventral hernias, one containing a short segment of the mid transverse colon and the other containing a short segment of the mid small bowel. There is no associated evidence to suggest bowel incarceration or obstruction at this time. 3. Tiny locule of gas non dependently in the lumen of the urinary bladder. This is presumably iatrogenic related to recent catheterization for urinalysis. Alternatively, this could be seen in the setting of urinary tract infection with gas-forming organisms. Clinical correlation for history of recent catheterization is recommended. 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 5. Status post cholecystectomy. 6. Additional incidental findings, as above.    12/11/2016 Imaging   Ct abdomen 1. No evidence of metastatic ovarian cancer. 2. Recurrent subxiphoid ventral abdominal wall hernia containing transverse colon. No evidence of incarceration or obstruction. 3. Stable incidental findings in the liver and kidneys. No recurrent urinary tract calculus. 4. Progressive lower lumbar spondylosis. 5.  Aortic Atherosclerosis (ICD10-I70.0).    08/30/2017 Imaging   MRI thoracic spine 1. At T5-6 there is a small central disc protrusion contacting the ventral thoracic spinal cord. No central canal or foraminal stenosis. 2. At T9-10 there is a small right paracentral disc protrusion. 3.  No acute osseous injury of the thoracic spine. 4. No aggressive osseous lesion to suggest  metastatic disease.   09/24/2017 Tumor Marker   Patient's tumor was  tested for the following markers: CA-125 Results of the tumor marker test revealed 21.7   09/30/2017 Imaging   CT abdomen 1. New small clustered soft tissue nodules in the left lower quadrant in the sigmoid mesentery, largest 1.0 cm, which could represent recurrent peritoneal tumor implants. No ascites.  2. Midline high ventral abdominal wall hernia containing a portion of the transverse colon is mildly increased in size, and without bowel complication at this time. 3. Chronic findings include: Aortic Atherosclerosis (ICD10-I70.0). Diffuse hepatic steatosis. 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   10/11/2017 PET scan   1. Nodules in the sigmoid mesentery are hypermetabolic and highly worrisome for metastatic disease. 2. Attic steatosis.    11/03/2017 Procedure   Successful CT-guided rectus abdominal muscle mass core biopsy. Path: - FOREIGN BODY GIANT CELL REACTION INVOLVING FIBROADIPOSE TISSUE AND SKELETAL MUSCLE. - NO EVIDENCE OF MALIGNANCY.   11/04/2017 Cancer Staging   Staging form: Fallopian Tube, AJCC 7th Edition - Clinical: FIGO Stage IIIC, calculated as Stage IV (rT3, N0, M1) - Signed by Kristin Lark, MD on 08/09/2019   02/2018 Imaging   CT: 1. Continued increase in size small peritoneal nodules along the mesenteric border of the proximal sigmoid colon as well as along the serosal surface of the proximal sigmoid colon. 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 2. No evidence of distant disease. 3. Stable large ventral hernia.   05/2018 Imaging   PET: 1. Redemonstration of hypermetabolic nodules within the sigmoid mesocolon. Mild response to therapy relative to CT of 02/24/2018. Mixed response to therapy compared to the most recent PET of 10/11/2017. 2. Hypermetabolism within the right pelvic rectus musculature, increased since the prior PET.  Clinical service requested comparison to the  11/24/2017 CT. Index 10 mm nodule within the sigmoid mesocolon was similar to the 02/24/2018 CT, and as described on that exam, increased from 7 mm on 11/24/2017. More inferior nodule within the mesocolon measures 12 mm today on image 156/4 and 8 mm on 11/24/2017.   05/2018 Imaging   CT: IMPRESSION: 1. Since 02/24/2018, decreased size of peritoneal nodules centered in the sigmoid mesocolon. 2. No evidence of new or progressive disease. 3. Hepatic steatosis. 4. Subcentimeter right renal angiomyolipoma, similar. 5.  Aortic Atherosclerosis (ICD10-I70.0). 6. Ventral abdominal wall laxity containing transverse colon, similar.   08/2018 Imaging   CT: IMPRESSION: 1. Nodules within the sigmoid mesocolon have decreased in size compared to prior. No new peritoneal or omental nodularity. 2. No evidence local recurrence the pelvis. 3. Ventral hernia contains a segment of transverse colon. No change from prior.     06/19/2019 Imaging   1. No evidence of metastatic disease in the abdomen pelvis. 2. No peritoneal or omental metastasis identified.  No free fluid. 3. Postcholecystectomy and hysterectomy. Comparison exams are made available. Comparison CT 09/08/2018 and 05/27/2018. PET-CT 05/27/2018    There is a nodule within the proximal aspect of the sigmoid colon measuring 2.2 by 2.2 cm. In comparison to prior CTs and PET-CT there was a hypermetabolic nodule at this location on the PET-CT of 08/27/2017 and on the CT of 09/08/2018 there was a small residual nodule. At that time (09/08/2018) the nodule measured 1.4 by 1.3 cm. Therefore this nodule has increased in the interval and concerning for recurrence of a serosal implant. The previous described lymph nodes in the sigmoid mesocolon and more central mesentery  are not increased in size and not pathologic by size criteria.    Concern for recurrence of serosal metastasis in the proximal sigmoid colon with a 2 cm enlarging lesion. Lesion extends into the  lumen. No evidence of high-grade obstruction. Consider FDG PET scan and/or potential colonoscopy for evaluation.   06/29/2019 PET scan   1. Enlarging serosal implant within the proximal sigmoid colon has intense metabolic activity most consistent with malignancy. Lesion exhibits a somewhat indolent progression as present on PET-CT scan from 10/11/2017 and 05/27/2018. 2. Focal hypermetabolic activity within the RIGHT rectus muscle just off midline is slightly decreased from comparison exam. This may be benign inflammation related to prior laparotomy, however malignancy not excluded.   07/26/2019 Procedure   She had colonoscopy which showed multiple polyps.  6 polyps were removed from the cecum, measuring 3 to 6 mm in size.  One 12 mm polyp was removed from ascending colon and another 1 measures 7 mm.  One 5 mm polyp was removed from the transverse colon.  There is tumor noted in the sigmoid colon, approximately 35 cm from the anus which was biopsied.  The tumor appeared to be fungating, infiltrative and ulcerated but nonobstructive.  It encompassed approximately one third of the circumference of the lumen.   07/26/2019 Pathology Results   Multiple polyps came back tubular adenomas.  2 ascending polyps came back sessile serrated adenoma months.  Sigmoid colon biopsy confirmed metastatic high-grade serous carcinoma.  The morphology and immunophenotype are consistent with metastatic high-grade serous carcinoma from tubal/ovarian primary site.   08/17/2019 -  Chemotherapy   The patient had palonosetron (ALOXI) injection 0.25 mg, 0.25 mg, Intravenous,  Once, 1 of 6 cycles Administration: 0.25 mg (08/17/2019) CARBOplatin (PARAPLATIN) 560 mg in sodium chloride 0.9 % 250 mL chemo infusion, 750 mg (100 % of original dose 750 mg), Intravenous,  Once, 1 of 6 cycles Dose modification:   (original dose 750 mg, Cycle 1), 750 mg (original dose 750 mg, Cycle 1),   (original dose 750 mg, Cycle 2, Reason: Change in  SCr/CrCl) fosaprepitant (EMEND) 150 mg in sodium chloride 0.9 % 145 mL IVPB, 150 mg, Intravenous,  Once, 1 of 6 cycles Administration: 150 mg (08/17/2019) PACLitaxel (TAXOL) 336 mg in sodium chloride 0.9 % 500 mL chemo infusion (> 61m/m2), 175 mg/m2 = 336 mg, Intravenous,  Once, 1 of 6 cycles Administration: 336 mg (08/17/2019)  for chemotherapy treatment.      REVIEW OF SYSTEMS:   Constitutional: Denies fevers, chills or abnormal weight loss Eyes: Denies blurriness of vision Ears, nose, mouth, throat, and face: Denies mucositis or sore throat Respiratory: Denies cough, dyspnea or wheezes Cardiovascular: Denies palpitation, chest discomfort or lower extremity swelling Gastrointestinal:  Denies nausea, heartburn or change in bowel habits Skin: Denies abnormal skin rashes Lymphatics: Denies new lymphadenopathy or easy bruising Neurological:Denies numbness, tingling or new weaknesses Behavioral/Psych: Mood is stable, no new changes  All other systems were reviewed with the patient and are negative.  I have reviewed the past medical history, past surgical history, social history and family history with the patient and they are unchanged from previous note.  ALLERGIES:  is allergic to aTeachers Insurance and Annuity Associationtartrate]; bee venom; and cortisone.  MEDICATIONS:  Current Outpatient Medications  Medication Sig Dispense Refill  . Armodafinil 250 MG tablet TAKE ONE TABLET BY MOUTH EVERY DAY (Patient taking differently: Take 250 mg by mouth every morning. ) 30 tablet 5  . Carboxymethylcellulose Sodium (EYE DROPS OP) Apply 2 drops to eye  daily as needed (dry eye).    . cholecalciferol (VITAMIN D3) 25 MCG (1000 UT) tablet Take 1,000 Units by mouth daily.    . colestipol (COLESTID) 1 g tablet Take 0.5-1 g by mouth daily. Pt taking 1/2 to 1 tablet daily depending on meal choice; taken 1 hour after morning meds , and can not eat for another hour    . dexamethasone (DECADRON) 4 MG tablet Take 2 tabs at the night  before and 2 tabs the morning of chemotherapy, every 3 weeks, by mouth x 6 cycles, please dispense 24 pills 24 tablet 6  . EPINEPHrine (EPIPEN 2-PAK) 0.3 mg/0.3 mL DEVI Inject 0.3 mg into the muscle once as needed (For bee stings.). Reported on 01/20/2016    . eszopiclone (LUNESTA) 2 MG TABS tablet TAKE ONE-HALF TO ONE TABLET BY MOUTH EVERY NIGHT AT BEDTIME AS NEEDED (Patient taking differently: Take 2 mg by mouth See admin instructions. TAKE  ONE TABLET BY MOUTH EVERY NIGHT AT BEDTIME) 90 tablet 0  . FLUoxetine (PROZAC) 10 MG capsule Take 10 mg by mouth every morning. Takes with a 17m capsule for her total dose of 323m    . Marland KitchenLUoxetine (PROZAC) 20 MG capsule Take 20 mg by mouth every morning. Takes with a 1063mapsule for her total dose of 46m45m  . ibMarland Kitchenprofen (ADVIL,MOTRIN) 200 MG tablet Take 400-600 mg by mouth every 6 (six) hours as needed for mild pain or moderate pain.    . loMarland Kitchenartan-hydrochlorothiazide (HYZAAR) 100-12.5 MG tablet Take 1 tablet by mouth daily.     . MEMarland KitchenATONIN PO Take 10 mg by mouth at bedtime.     . metFORMIN (GLUCOPHAGE-XR) 500 MG 24 hr tablet Take 500 mg by mouth daily with breakfast.   3  . modafinil (PROVIGIL) 200 MG tablet Take 1 tablet (200 mg total) by mouth every morning. (Patient not taking: Reported on 08/02/2019) 30 tablet 0  . Multiple Vitamin (MULTIVITAMIN WITH MINERALS) TABS Take 1 tablet by mouth every morning.     . naproxen sodium (ALEVE) 220 MG tablet Take 220 mg by mouth daily as needed (pain).     . ondansetron (ZOFRAN) 8 MG tablet Take 1 tablet (8 mg total) by mouth every 8 (eight) hours as needed for refractory nausea / vomiting. Start on day 3 after carboplatin chemo. 30 tablet 1  . prochlorperazine (COMPAZINE) 10 MG tablet Take 1 tablet (10 mg total) by mouth every 6 (six) hours as needed (Nausea or vomiting). 30 tablet 1  . rosuvastatin (CRESTOR) 20 MG tablet Take 20 mg by mouth every morning.      No current facility-administered medications for this  visit.   Facility-Administered Medications Ordered in Other Visits  Medication Dose Route Frequency Provider Last Rate Last Admin  . CARBOplatin (PARAPLATIN) 560 mg in sodium chloride 0.9 % 250 mL chemo infusion  560 mg Intravenous Once GorsAlvy Bimler, MD      . heparin lock flush 100 unit/mL  500 Units Intracatheter Once PRN GorsAlvy Bimler, MD      . PACLitaxel (TAXOL) 336 mg in sodium chloride 0.9 % 500 mL chemo infusion (> 80mg2m  175 mg/m2 (Treatment Plan Recorded) Intravenous Once GorsuHeath Lark46 mL/hr at 08/17/19 1335 336 mg at 08/17/19 1335  . sodium chloride flush (NS) 0.9 % injection 10 mL  10 mL Intravenous PRN GehriNancy Marus  10 mL at 05/12/17 0823  . sodium chloride flush (NS) 0.9 % injection 10 mL  10 mL  Intracatheter PRN Kristin Lark, MD        PHYSICAL EXAMINATION: ECOG PERFORMANCE STATUS: 0 - Asymptomatic GENERAL:alert, no distress and comfortable Musculoskeletal:no cyanosis of digits and no clubbing  NEURO: alert & oriented x 3 with fluent speech, no focal motor/sensory deficits  LABORATORY DATA:  I have reviewed the data as listed    Component Value Date/Time   NA 142 08/17/2019 0943   NA 141 12/02/2016 1219   K 3.9 08/17/2019 0943   K 3.7 12/02/2016 1219   CL 103 08/17/2019 0943   CL 103 01/18/2013 1329   CO2 27 08/17/2019 0943   CO2 27 12/02/2016 1219   GLUCOSE 145 (H) 08/17/2019 0943   GLUCOSE 98 12/02/2016 1219   GLUCOSE 110 (H) 01/18/2013 1329   BUN 25 (H) 08/17/2019 0943   BUN 15.3 12/02/2016 1219   CREATININE 0.83 08/17/2019 0943   CREATININE 0.7 12/02/2016 1219   CALCIUM 9.3 08/17/2019 0943   CALCIUM 10.1 12/02/2016 1219   PROT 7.7 08/17/2019 0943   PROT 7.1 12/02/2016 1219   ALBUMIN 4.4 08/17/2019 0943   ALBUMIN 4.2 12/02/2016 1219   AST 21 08/17/2019 0943   AST 21 12/02/2016 1219   ALT 32 08/17/2019 0943   ALT 27 12/02/2016 1219   ALKPHOS 132 (H) 08/17/2019 0943   ALKPHOS 104 12/02/2016 1219   BILITOT 0.3 08/17/2019 0943   BILITOT 0.37  12/02/2016 1219   GFRNONAA >60 08/17/2019 0943   GFRAA >60 08/17/2019 0943    No results found for: SPEP, UPEP  Lab Results  Component Value Date   WBC 5.3 08/17/2019   NEUTROABS 4.5 08/17/2019   HGB 14.1 08/17/2019   HCT 42.8 08/17/2019   MCV 90.5 08/17/2019   PLT 186 08/17/2019      Chemistry      Component Value Date/Time   NA 142 08/17/2019 0943   NA 141 12/02/2016 1219   K 3.9 08/17/2019 0943   K 3.7 12/02/2016 1219   CL 103 08/17/2019 0943   CL 103 01/18/2013 1329   CO2 27 08/17/2019 0943   CO2 27 12/02/2016 1219   BUN 25 (H) 08/17/2019 0943   BUN 15.3 12/02/2016 1219   CREATININE 0.83 08/17/2019 0943   CREATININE 0.7 12/02/2016 1219   GLU 236 12/13/2012 1558      Component Value Date/Time   CALCIUM 9.3 08/17/2019 0943   CALCIUM 10.1 12/02/2016 1219   ALKPHOS 132 (H) 08/17/2019 0943   ALKPHOS 104 12/02/2016 1219   AST 21 08/17/2019 0943   AST 21 12/02/2016 1219   ALT 32 08/17/2019 0943   ALT 27 12/02/2016 1219   BILITOT 0.3 08/17/2019 0943   BILITOT 0.37 12/02/2016 1219       RADIOGRAPHIC STUDIES: I have personally reviewed the radiological images as listed and agreed with the findings in the report. DG BONE DENSITY (DXA)  Result Date: 08/16/2019 EXAM: DUAL X-RAY ABSORPTIOMETRY (DXA) FOR BONE MINERAL DENSITY IMPRESSION: Referring Physician:  [facility] Your patient completed a BMD test using Lunar IDXA DXA system ( analysis version: 16 ) manufactured by [employer]. Technologist: WLS PATIENT: Name: Kristin Webb, Kristin Webb Patient ID: [id number] Birth Date: September 25, 1945 Height: 59.0 in. Sex: Female Measured: 08/16/2019 Weight: 188.8 lbs. Indications: Advanced Age, Bilateral Ovariectomy (65.51), Caucasian, Estrogen Deficient, Hysterectomy, Low Calcium Intake (269.3), Ovarian Cancer, Postmenopausal, Secondary Osteoporosis Fractures: None Treatments: Vitamin D (E933.5) ASSESSMENT: The BMD measured at Femur Neck Left is 0.809 g/cm2 with a T-score of -1.6. This patient is  considered osteopenic according  to [facility]) criteria. The scan quality is limited by patient body habitus. Site Region Measured Date Measured Age YA BMD Significant CHANGE T-score AP Spine  L1-L4      9/16/2019    73.6         -1.0    1.063 g/cm2 DualFemur Neck Left  08/16/2019    72.6         -1.6    0.809 g/cm2 DualFemur Total Mean 08/16/2019    72.6         -0.5    0.944 g/cm2 World Health Organization [facility]) criteria for post-menopausal, Caucasian Women: Normal       T-score at or above -1 SD Osteopenia   T-score between -1 and -2.5 SD Osteoporosis T-score at or below -2.5 SD RECOMMENDATION: 1. All patients should optimize calcium and vitamin D intake. 2. Consider FDA approved medical therapies in postmenopausal women and men aged 20 years and older, based on the following: a. A hip or vertebral (clinical or morphometric) fracture b. T- score < or = -2.5 at the femoral neck or spine after appropriate evaluation to exclude secondary causes c. Low bone mass (T-score between -1.0 and -2.5 at the femoral neck or spine) and a 10 year probability of a hip fracture > or = 3% or a 10 year probability of a major osteoporosis-related fracture > or = 20% based on the [place]-adapted WHO algorithm d. Clinician judgment and/or patient preferences may indicate treatment for people with 10-year fracture probabilities above or below these levels FOLLOW-UP: Patients with diagnosis of osteoporosis or at high risk for fracture should have regular bone mineral density tests. For patients eligible for Medicare, routine testing is allowed once every 2 years. The testing frequency can be increased to one year for patients who have rapidly progressing disease, those who are receiving or discontinuing medical therapy to restore bone mass, or have additional risk factors. I have reviewed this report and agree with the above findings. [facility] FRAX* 10-year Probability of Fracture Based on femoral neck BMD:  DualFemur (Left) Major Osteoporotic Fracture: 10.0% Hip Fracture:                1.7% Population:                  [place] (Caucasian) Risk Factors:                Secondary Osteoporosis *FRAX is a Materials engineer of the [employer] of [employer] for Metabolic Bone Disease, a [place] (WHO) [facility]. ASSESSMENT: The probability of a major osteoporotic fracture is 10.0 % within the next ten years. The probability of a hip fracture is 1.7 % within the next ten years. Electronically Signed   By: Lowella Grip III M.D.   On: 08/16/2019 08:33   MM 3D SCREEN BREAST BILATERAL  Result Date: 08/16/2019 CLINICAL DATA:  Screening. EXAM: DIGITAL SCREENING BILATERAL MAMMOGRAM WITH TOMO AND CAD COMPARISON:  Previous exam(s). ACR Breast Density Category c: The breast tissue is heterogeneously dense, which may obscure small masses. FINDINGS: In the left breast, a possible mass warrants further evaluation. In the right breast, no findings suspicious for malignancy. Images were processed with CAD. IMPRESSION: Further evaluation is suggested for possible mass in the left breast. RECOMMENDATION: Diagnostic mammogram and possibly ultrasound of the left breast. (Code:[id number]) The patient will be contacted regarding the findings, and additional imaging will be scheduled. BI-RADS CATEGORY  0: Incomplete. Need additional imaging evaluation  and/or prior mammograms for comparison. Electronically Signed   By: Kristopher Oppenheim M.D.   On: 08/16/2019 08:30

## 2019-08-17 NOTE — Assessment & Plan Note (Signed)
She have questions about recent mammogram result Due to ongoing chemotherapy, I recommend delaying her mammogram until after completion of treatment She is not symptomatic I also reviewed the bone density scan per patient request At present time, I felt that she have osteopenia but not osteoporosis I reassured the patient

## 2019-08-18 ENCOUNTER — Telehealth: Payer: Self-pay

## 2019-08-18 ENCOUNTER — Telehealth: Payer: Self-pay | Admitting: Emergency Medicine

## 2019-08-18 NOTE — Telephone Encounter (Signed)
Chemotherapy follow up from yesterday - patient reports "nothing out of the ordinary". Her temperature was 96 degrees today "but I think my thermometer is old". Advised her we can offer her a new one when she comes in again. She has a mild headache but stated she is not concerned about that at this time. Advised her to call with any concerns if they develop and she agreed.

## 2019-08-18 NOTE — Telephone Encounter (Signed)
Chemo f/u call, no answer.  LVM stating pt can call back with any questions or concerns.

## 2019-08-23 ENCOUNTER — Other Ambulatory Visit: Payer: Medicare HMO

## 2019-08-24 ENCOUNTER — Encounter: Payer: Self-pay | Admitting: Hematology and Oncology

## 2019-08-24 NOTE — Telephone Encounter (Signed)
PA approved through [facility] 08/10/2018 12:00:00 AM, Coverage Ends on: 08/09/2020

## 2019-08-28 ENCOUNTER — Telehealth: Payer: Self-pay

## 2019-08-28 DIAGNOSIS — R6889 Other general symptoms and signs: Secondary | ICD-10-CM | POA: Diagnosis not present

## 2019-08-28 NOTE — Telephone Encounter (Signed)
Patient called to inform her ovarian cancer has returned and she has begun chemo. Patient is unsure if any other information is needed   Please follow up if necessary

## 2019-08-28 NOTE — Telephone Encounter (Signed)
I wish her strength and the best outcome possible.

## 2019-08-29 ENCOUNTER — Encounter: Payer: Self-pay | Admitting: Neurology

## 2019-08-30 ENCOUNTER — Telehealth: Payer: Self-pay

## 2019-08-30 NOTE — Telephone Encounter (Signed)
Pharmacy called requesting a PA for the following  1.) Medication Name:Armodafinil 250 MG tablet    2.) Pharmacy calling: [facility]  3.) Follow up information:  Phone number: [phone]  Fax number:  [phone]

## 2019-08-31 NOTE — Telephone Encounter (Signed)
PA completed through cover my meds/ [employer].  [id number] Can take 3-7 days before getting a response. Will fax approval once received to the pharmacy

## 2019-08-31 NOTE — Telephone Encounter (Signed)
[employer] approved coverage for the medication until 08/09/2020.

## 2019-09-07 ENCOUNTER — Other Ambulatory Visit: Payer: Self-pay

## 2019-09-07 ENCOUNTER — Inpatient Hospital Stay: Payer: Medicare HMO

## 2019-09-07 ENCOUNTER — Inpatient Hospital Stay (HOSPITAL_BASED_OUTPATIENT_CLINIC_OR_DEPARTMENT_OTHER): Payer: Medicare HMO | Admitting: Hematology and Oncology

## 2019-09-07 DIAGNOSIS — K760 Fatty (change of) liver, not elsewhere classified: Secondary | ICD-10-CM | POA: Diagnosis not present

## 2019-09-07 DIAGNOSIS — Z5111 Encounter for antineoplastic chemotherapy: Secondary | ICD-10-CM | POA: Diagnosis not present

## 2019-09-07 DIAGNOSIS — C785 Secondary malignant neoplasm of large intestine and rectum: Secondary | ICD-10-CM | POA: Diagnosis not present

## 2019-09-07 DIAGNOSIS — M159 Polyosteoarthritis, unspecified: Secondary | ICD-10-CM | POA: Diagnosis not present

## 2019-09-07 DIAGNOSIS — Z7189 Other specified counseling: Secondary | ICD-10-CM

## 2019-09-07 DIAGNOSIS — E782 Mixed hyperlipidemia: Secondary | ICD-10-CM | POA: Diagnosis not present

## 2019-09-07 DIAGNOSIS — M797 Fibromyalgia: Secondary | ICD-10-CM

## 2019-09-07 DIAGNOSIS — I10 Essential (primary) hypertension: Secondary | ICD-10-CM | POA: Diagnosis not present

## 2019-09-07 DIAGNOSIS — C5701 Malignant neoplasm of right fallopian tube: Secondary | ICD-10-CM

## 2019-09-07 DIAGNOSIS — D696 Thrombocytopenia, unspecified: Secondary | ICD-10-CM | POA: Diagnosis not present

## 2019-09-07 DIAGNOSIS — I700 Atherosclerosis of aorta: Secondary | ICD-10-CM | POA: Diagnosis not present

## 2019-09-07 DIAGNOSIS — E1159 Type 2 diabetes mellitus with other circulatory complications: Secondary | ICD-10-CM | POA: Diagnosis not present

## 2019-09-07 DIAGNOSIS — E119 Type 2 diabetes mellitus without complications: Secondary | ICD-10-CM

## 2019-09-07 DIAGNOSIS — C786 Secondary malignant neoplasm of retroperitoneum and peritoneum: Secondary | ICD-10-CM | POA: Diagnosis not present

## 2019-09-07 DIAGNOSIS — M8580 Other specified disorders of bone density and structure, unspecified site: Secondary | ICD-10-CM | POA: Diagnosis not present

## 2019-09-07 DIAGNOSIS — C5700 Malignant neoplasm of unspecified fallopian tube: Secondary | ICD-10-CM | POA: Diagnosis not present

## 2019-09-07 LAB — CBC WITH DIFFERENTIAL (CANCER CENTER ONLY)
Abs Immature Granulocytes: 0.03 10*3/uL (ref 0.00–0.07)
Basophils Absolute: 0 10*3/uL (ref 0.0–0.1)
Basophils Relative: 1 %
Eosinophils Absolute: 0.1 10*3/uL (ref 0.0–0.5)
Eosinophils Relative: 2 %
HCT: 38.7 % (ref 36.0–46.0)
Hemoglobin: 12.6 g/dL (ref 12.0–15.0)
Immature Granulocytes: 1 %
Lymphocytes Relative: 32 %
Lymphs Abs: 1.6 10*3/uL (ref 0.7–4.0)
MCH: 29.5 pg (ref 26.0–34.0)
MCHC: 32.6 g/dL (ref 30.0–36.0)
MCV: 90.6 fL (ref 80.0–100.0)
Monocytes Absolute: 0.6 10*3/uL (ref 0.1–1.0)
Monocytes Relative: 12 %
Neutro Abs: 2.7 10*3/uL (ref 1.7–7.7)
Neutrophils Relative %: 52 %
Platelet Count: 108 10*3/uL — ABNORMAL LOW (ref 150–400)
RBC: 4.27 MIL/uL (ref 3.87–5.11)
RDW: 13.4 % (ref 11.5–15.5)
WBC Count: 5 10*3/uL (ref 4.0–10.5)
nRBC: 0 % (ref 0.0–0.2)

## 2019-09-07 LAB — CMP (CANCER CENTER ONLY)
ALT: 26 U/L (ref 0–44)
AST: 16 U/L (ref 15–41)
Albumin: 4 g/dL (ref 3.5–5.0)
Alkaline Phosphatase: 109 U/L (ref 38–126)
Anion gap: 8 (ref 5–15)
BUN: 14 mg/dL (ref 8–23)
CO2: 27 mmol/L (ref 22–32)
Calcium: 8.8 mg/dL — ABNORMAL LOW (ref 8.9–10.3)
Chloride: 108 mmol/L (ref 98–111)
Creatinine: 0.68 mg/dL (ref 0.44–1.00)
GFR, Est AFR Am: >60 mL/min (ref >60)
GFR, Estimated: >60 mL/min (ref >60)
Glucose, Bld: 96 mg/dL (ref 70–99)
Potassium: 4.2 mmol/L (ref 3.5–5.1)
Sodium: 143 mmol/L (ref 135–145)
Total Bilirubin: 0.3 mg/dL (ref 0.3–1.2)
Total Protein: 6.7 g/dL (ref 6.5–8.1)

## 2019-09-07 MED ORDER — SODIUM CHLORIDE 0.9% FLUSH
10.0000 mL | INTRAVENOUS | Status: DC | PRN
  Administered 2019-09-07: 10 mL
  Filled 2019-09-07: qty 10

## 2019-09-07 MED ORDER — DIPHENHYDRAMINE HCL 50 MG/ML IJ SOLN
INTRAMUSCULAR | Status: AC
  Filled 2019-09-07: qty 1

## 2019-09-07 MED ORDER — FAMOTIDINE IN NACL 20-0.9 MG/50ML-% IV SOLN
INTRAVENOUS | Status: AC
  Filled 2019-09-07: qty 50

## 2019-09-07 MED ORDER — SODIUM CHLORIDE 0.9 % IV SOLN
556.2000 mg | Freq: Once | INTRAVENOUS | Status: AC
  Administered 2019-09-07: 560 mg via INTRAVENOUS
  Filled 2019-09-07: qty 56

## 2019-09-07 MED ORDER — HEPARIN SOD (PORK) LOCK FLUSH 100 UNIT/ML IV SOLN
500.0000 [IU] | Freq: Once | INTRAVENOUS | Status: AC | PRN
  Administered 2019-09-07: 500 [IU]
  Filled 2019-09-07: qty 5

## 2019-09-07 MED ORDER — ACETAMINOPHEN 325 MG PO TABS
ORAL_TABLET | ORAL | Status: AC
  Filled 2019-09-07: qty 2

## 2019-09-07 MED ORDER — FAMOTIDINE IN NACL 20-0.9 MG/50ML-% IV SOLN
20.0000 mg | Freq: Once | INTRAVENOUS | Status: AC
  Administered 2019-09-07: 20 mg via INTRAVENOUS

## 2019-09-07 MED ORDER — SODIUM CHLORIDE 0.9 % IV SOLN
Freq: Once | INTRAVENOUS | Status: AC
  Filled 2019-09-07: qty 250

## 2019-09-07 MED ORDER — OXYCODONE-ACETAMINOPHEN 5-325 MG PO TABS
ORAL_TABLET | ORAL | Status: AC
  Filled 2019-09-07: qty 1

## 2019-09-07 MED ORDER — SODIUM CHLORIDE 0.9 % IV SOLN
175.0000 mg/m2 | Freq: Once | INTRAVENOUS | Status: AC
  Administered 2019-09-07: 336 mg via INTRAVENOUS
  Filled 2019-09-07: qty 56

## 2019-09-07 MED ORDER — DIPHENHYDRAMINE HCL 50 MG/ML IJ SOLN
50.0000 mg | Freq: Once | INTRAMUSCULAR | Status: AC
  Administered 2019-09-07: 50 mg via INTRAVENOUS

## 2019-09-07 MED ORDER — PALONOSETRON HCL INJECTION 0.25 MG/5ML
INTRAVENOUS | Status: AC
  Filled 2019-09-07: qty 5

## 2019-09-07 MED ORDER — DEXAMETHASONE SODIUM PHOSPHATE 10 MG/ML IJ SOLN
INTRAMUSCULAR | Status: AC
  Filled 2019-09-07: qty 1

## 2019-09-07 MED ORDER — SODIUM CHLORIDE 0.9 % IV SOLN
150.0000 mg | Freq: Once | INTRAVENOUS | Status: AC
  Administered 2019-09-07: 150 mg via INTRAVENOUS
  Filled 2019-09-07: qty 150

## 2019-09-07 MED ORDER — PALONOSETRON HCL INJECTION 0.25 MG/5ML
0.2500 mg | Freq: Once | INTRAVENOUS | Status: AC
  Administered 2019-09-07: 0.25 mg via INTRAVENOUS

## 2019-09-07 MED ORDER — ACETAMINOPHEN 325 MG PO TABS
650.0000 mg | ORAL_TABLET | Freq: Once | ORAL | Status: AC
  Administered 2019-09-07: 650 mg via ORAL

## 2019-09-07 MED ORDER — DEXAMETHASONE SODIUM PHOSPHATE 10 MG/ML IJ SOLN
10.0000 mg | Freq: Once | INTRAMUSCULAR | Status: AC
  Administered 2019-09-07: 10 mg via INTRAVENOUS

## 2019-09-07 MED ORDER — OXYCODONE-ACETAMINOPHEN 5-325 MG PO TABS
1.0000 | ORAL_TABLET | Freq: Once | ORAL | Status: AC
  Administered 2019-09-07: 1 via ORAL

## 2019-09-07 NOTE — Patient Instructions (Signed)
[facility] °Discharge Instructions for Patients Receiving Chemotherapy ° °Today you received the following chemotherapy agents taxol; carboplatin ° °To help prevent nausea and vomiting after your treatment, we encourage you to take your nausea medication as directed °  °If you develop nausea and vomiting that is not controlled by your nausea medication, call the clinic.  ° °BELOW ARE SYMPTOMS THAT SHOULD BE REPORTED IMMEDIATELY: °· *FEVER GREATER THAN 100.5 F °· *CHILLS WITH OR WITHOUT FEVER °· NAUSEA AND VOMITING THAT IS NOT CONTROLLED WITH YOUR NAUSEA MEDICATION °· *UNUSUAL SHORTNESS OF BREATH °· *UNUSUAL BRUISING OR BLEEDING °· TENDERNESS IN MOUTH AND THROAT WITH OR WITHOUT PRESENCE OF ULCERS °· *URINARY PROBLEMS °· *BOWEL PROBLEMS °· UNUSUAL RASH °Items with * indicate a potential emergency and should be followed up as soon as possible. ° °Feel free to call the clinic should you have any questions or concerns. The clinic phone number is [phone]. ° °Please show the CHEMO ALERT CARD at check-in to the Emergency Department and triage nurse. ° ° °

## 2019-09-08 ENCOUNTER — Encounter: Payer: Self-pay | Admitting: Hematology and Oncology

## 2019-09-08 ENCOUNTER — Telehealth: Payer: Self-pay | Admitting: Hematology and Oncology

## 2019-09-08 DIAGNOSIS — M797 Fibromyalgia: Secondary | ICD-10-CM | POA: Insufficient documentation

## 2019-09-08 DIAGNOSIS — D696 Thrombocytopenia, unspecified: Secondary | ICD-10-CM | POA: Insufficient documentation

## 2019-09-08 NOTE — Assessment & Plan Note (Signed)
This is likely due to recent treatment. The patient denies recent history of bleeding such as epistaxis, hematuria or hematochezia. She is asymptomatic from the low platelet count. I will observe for now.  she does not require transfusion now. I will continue the chemotherapy at current dose without dosage adjustment.  If the thrombocytopenia gets progressive worse in the future, I might have to delay her treatment or adjust the chemotherapy dose.   

## 2019-09-08 NOTE — Progress Notes (Signed)
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 The patient denies recent history of bleeding such as epistaxis, hematuria or hematochezia. She is asymptomatic from the low platelet count. I will observe for now.  she does not require transfusion now. I will continue the chemotherapy at current dose without dosage adjustment.  If the thrombocytopenia gets progressive worse in the future, I might have to delay her treatment or adjust the chemotherapy dose.    Newly diagnosed diabetes ([place]) She denies significant elevated blood sugar while on treatment Observe for now  Fibromyalgia She had exacerbation of fibromyalgia pain from recent treatment She will continue conservative approach with over-the-counter analgesics   No orders of the defined types were placed in this encounter.   All questions were answered. The patient knows to call the clinic with any problems, questions or concerns. The total time spent in the appointment was 20 minutes encounter with patients including review of chart and various tests results, discussions about plan of care and coordination of care plan   [name], MD 09/08/2019 8:32 AM  INTERVAL HISTORY: Please see below for problem oriented charting. She returns for chemotherapy and follow-up She had some loose bowel movement  recently but otherwise tolerated therapy well No nausea She had some exacerbation of back pain after treatment but is resolved now No recent infection, fever or chills  SUMMARY OF ONCOLOGIC HISTORY: Oncology History Overview Note  Oncologic Summary: 1. 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 Random CA 125 elevation January 2019  Question mesenteric nodules and anterior abdominal wall nodule 3. [employer] Breast/Ovary Panel negative (including BRCA, MMR's, RAD51 etc) ? 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  Interval development of large mid abdominal mass highly concerning for right ovarian cancer.  There is mild omental nodularity on the left, and peritoneal disease cannot be completely excluded.  There is no ascites or other evidence of metastatic disease. 2.  Mild associated renal pelvocaliectasis bilaterally without obstruction.  Nonobstructing left renal calculus and a small right renal angiomyolipoma noted incidentally.   11/08/2012 Pathology Results   1. 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 - BENIGN FALLOPIAN TUBE WITH NONSPECIFIC CHRONIC INFLAMMATION. 2. Ovary and fallopian tube, left - BENIGN OVARY; NEGATIVE FOR ATYPIA OR MALIGNANCY. - BENIGN FALLOPIAN TUBE; NEGATIVE FOR ATYPIA OR MALIGNANCY. 3. Omentum, resection for tumor - HIGH GRADE CARCINOMA, SEE COMMENT. 4. Appendix, Other than Incidental - FIBROUS OBLITERATION OF APPENDICEAL TIP. - NEGATIVE FOR MALIGNANCY.   11/08/2012 Surgery   Surgery:  Exploratory laparotomy, bilateral salpingo-oophorectomy, appendectomy, infacolic omentectomy, optimal debulking  Surgeons:  Paola A. Alycia Rossetti, MD; Lahoma Crocker, MD   Assistant: Caswell Corwin  Pathology: Bilateral fallopian tubes and ovaries to pathology. Appendix as well as omentum. 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  Operative findings: 25 cm right adnexal mass with smooth surface. Surgically absent uterus. Atrophic-appearing left ovary. Normal appearing appendix. Within the omentum there were centimeter nodules scattered throughout the omentum. The remainder of the surfaces were benign.   12/08/2012 Procedure   Impression:  Placement of a subcutaneous port device.  The catheter tip is in the lower SVC and ready to be used.    12/13/2012 - 03/28/2013 Chemotherapy   s/p 6 cycles of paclitaxel and carboplatin   12/13/2012 - 03/28/2013 Chemotherapy   The patient had 6 cycles of carboplatin and Taxol   03/24/2013 Imaging   US abdomen   04/24/2013 Imaging   CT abdomen Interval resection of the large right pelvic and lower abdominal mass lesion with apparent omentectomy.  No evidence for intraperitoneal free fluid on today's study.  No discernible peritoneal lesions.  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   09/07/2013 Genetic Testing   Patient has genetic testing done for BRCA1/2 panel Results revealed patient has no mutation(s):   07/09/2015 Imaging   CT abdomen 1. 12 mm obstructive calculus at the left ureteropelvic junction with moderate proximal hydronephrosis. 2. 2 small supraumbilical ventral hernias, one containing a short segment of the mid transverse colon and the other containing a short segment of the mid small bowel. There is no associated evidence to suggest bowel incarceration or obstruction at this time. 3. Tiny locule of gas non dependently in the lumen of the urinary bladder.  This is presumably iatrogenic related to recent catheterization for urinalysis. Alternatively, this could be seen in the setting of urinary tract infection with gas-forming organisms. Clinical correlation for history of recent catheterization is recommended. 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 5. Status post cholecystectomy. 6. Additional incidental findings, as above.    12/11/2016 Imaging   Ct abdomen 1. No evidence of metastatic ovarian cancer. 2. Recurrent subxiphoid ventral abdominal wall hernia containing transverse colon. No evidence of incarceration or obstruction. 3. Stable incidental findings in the liver and kidneys. No recurrent urinary tract calculus. 4. Progressive lower lumbar spondylosis. 5.  Aortic Atherosclerosis (ICD10-I70.0).    08/30/2017 Imaging   MRI thoracic spine 1. At T5-6 there is a small central disc protrusion contacting the ventral thoracic spinal cord. No central canal or foraminal stenosis. 2. At T9-10 there is a small right paracentral disc protrusion. 3.  No acute osseous injury of the thoracic spine. 4. No aggressive osseous lesion to suggest metastatic disease.   09/24/2017 Tumor Marker   Patient's tumor was tested for the following markers: CA-125 Results of the tumor marker test revealed 21.7   09/30/2017 Imaging   CT abdomen 1. New small clustered soft tissue nodules in the left lower quadrant in the sigmoid mesentery, largest 1.0 cm, which could represent recurrent peritoneal tumor implants. No ascites.  2. Midline high ventral abdominal wall hernia containing a portion of the transverse colon is mildly increased in size, and without bowel complication at this time. 3. Chronic findings include: Aortic Atherosclerosis (ICD10-I70.0). Diffuse hepatic steatosis. 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   10/11/2017 PET scan  1. Nodules in the sigmoid mesentery are  hypermetabolic and highly worrisome for metastatic disease. 2. Attic steatosis.    11/03/2017 Procedure   Successful CT-guided rectus abdominal muscle mass core biopsy. Path: - FOREIGN BODY GIANT CELL REACTION INVOLVING FIBROADIPOSE TISSUE AND SKELETAL MUSCLE. - NO EVIDENCE OF MALIGNANCY.   11/04/2017 Cancer Staging   Staging form: Fallopian Tube, AJCC 7th Edition - Clinical: FIGO Stage IIIC, calculated as Stage IV (rT3, N0, M1) - Signed by [name], MD on 08/09/2019   02/2018 Imaging   CT: 1. Continued increase in size small peritoneal nodules along the mesenteric border of the proximal sigmoid colon as well as along the serosal surface of the proximal sigmoid colon. 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 2. No evidence of distant disease. 3. Stable large ventral hernia.   05/2018 Imaging   PET: 1. Redemonstration of hypermetabolic nodules within the sigmoid mesocolon. Mild response to therapy relative to CT of 02/24/2018. Mixed response to therapy compared to the most recent PET of 10/11/2017. 2. Hypermetabolism within the right pelvic rectus musculature, increased since the prior PET.  Clinical service requested comparison to the 11/24/2017 CT. Index 10 mm nodule within the sigmoid mesocolon was similar to the 02/24/2018 CT, and as described on that exam, increased from 7 mm on 11/24/2017. More inferior nodule within the mesocolon measures 12 mm today on image 156/4 and 8 mm on 11/24/2017.   05/2018 Imaging   CT: IMPRESSION: 1. Since 02/24/2018, decreased size of peritoneal nodules centered in the sigmoid mesocolon. 2. No evidence of new or progressive disease. 3. Hepatic steatosis. 4. Subcentimeter right renal angiomyolipoma, similar. 5.  Aortic Atherosclerosis (ICD10-I70.0). 6. Ventral abdominal wall laxity containing transverse colon, similar.   08/2018 Imaging   CT: IMPRESSION: 1. Nodules within the sigmoid mesocolon have decreased in  size compared to prior. No new peritoneal or omental nodularity. 2. No evidence local recurrence the pelvis. 3. Ventral hernia contains a segment of transverse colon. No change from prior.     06/19/2019 Imaging   1. No evidence of metastatic disease in the abdomen pelvis. 2. No peritoneal or omental metastasis identified.  No free fluid. 3. Postcholecystectomy and hysterectomy. Comparison exams are made available. Comparison CT 09/08/2018 and 05/27/2018. PET-CT 05/27/2018    There is a nodule within the proximal aspect of the sigmoid colon measuring 2.2 by 2.2 cm. In comparison to prior CTs and PET-CT there was a hypermetabolic nodule at this location on the PET-CT of 08/27/2017 and on the CT of 09/08/2018 there was a small residual nodule. At that time (09/08/2018) the nodule measured 1.4 by 1.3 cm. Therefore this nodule has increased in the interval and concerning for recurrence of a serosal implant. The previous described lymph nodes in the sigmoid mesocolon and more central mesentery are not increased in size and not pathologic by size criteria.    Concern for recurrence of serosal metastasis in the proximal sigmoid colon with a 2 cm enlarging lesion. Lesion extends into the lumen. No evidence of high-grade obstruction. Consider FDG PET scan and/or potential colonoscopy for evaluation.   06/29/2019 PET scan   1. Enlarging serosal implant within the proximal sigmoid colon has intense metabolic activity most consistent with malignancy. Lesion exhibits a somewhat indolent progression as present on PET-CT scan from 10/11/2017 and 05/27/2018. 2. Focal hypermetabolic activity within the RIGHT rectus muscle just off midline is slightly decreased from comparison exam. This may be benign inflammation related to prior laparotomy, however malignancy not  excluded.   07/26/2019 Procedure   She had colonoscopy which showed multiple polyps.  6 polyps were removed from the cecum, measuring 3 to 6 mm in  size.  One 12 mm polyp was removed from ascending colon and another 1 measures 7 mm.  One 5 mm polyp was removed from the transverse colon.  There is tumor noted in the sigmoid colon, approximately 35 cm from the anus which was biopsied.  The tumor appeared to be fungating, infiltrative and ulcerated but nonobstructive.  It encompassed approximately one third of the circumference of the lumen.   07/26/2019 Pathology Results   Multiple polyps came back tubular adenomas.  2 ascending polyps came back sessile serrated adenoma months.  Sigmoid colon biopsy confirmed metastatic high-grade serous carcinoma.  The morphology and immunophenotype are consistent with metastatic high-grade serous carcinoma from tubal/ovarian primary site.   08/17/2019 -  Chemotherapy   The patient had carboplatin and taxol     REVIEW OF SYSTEMS:   Constitutional: Denies fevers, chills or abnormal weight loss Eyes: Denies blurriness of vision Ears, nose, mouth, throat, and face: Denies mucositis or sore throat Respiratory: Denies cough, dyspnea or wheezes Cardiovascular: Denies palpitation, chest discomfort or lower extremity swelling Skin: Denies abnormal skin rashes Lymphatics: Denies new lymphadenopathy or easy bruising Neurological:Denies numbness, tingling or new weaknesses Behavioral/Psych: Mood is stable, no new changes  All other systems were reviewed with the patient and are negative.  I have reviewed the past medical history, past surgical history, social history and family history with the patient and they are unchanged from previous note.  ALLERGIES:  is allergic to [employer] tartrate]; bee venom; and cortisone.  MEDICATIONS:  Current Outpatient Medications  Medication Sig Dispense Refill  . Armodafinil 250 MG tablet TAKE ONE TABLET BY MOUTH EVERY DAY (Patient taking differently: Take 250 mg by mouth every morning. ) 30 tablet 5  . Carboxymethylcellulose Sodium (EYE DROPS OP) Apply 2 drops to eye  daily as needed (dry eye).    . cholecalciferol (VITAMIN D3) 25 MCG (1000 UT) tablet Take 1,000 Units by mouth daily.    . colestipol (COLESTID) 1 g tablet Take 0.5-1 g by mouth daily. Pt taking 1/2 to 1 tablet daily depending on meal choice; taken 1 hour after morning meds , and can not eat for another hour    . dexamethasone (DECADRON) 4 MG tablet Take 2 tabs at the night before and 2 tabs the morning of chemotherapy, every 3 weeks, by mouth x 6 cycles, please dispense 24 pills 24 tablet 6  . EPINEPHrine (EPIPEN 2-PAK) 0.3 mg/0.3 mL DEVI Inject 0.3 mg into the muscle once as needed (For bee stings.). Reported on 01/20/2016    . eszopiclone (LUNESTA) 2 MG TABS tablet TAKE ONE-HALF TO ONE TABLET BY MOUTH EVERY NIGHT AT BEDTIME AS NEEDED (Patient taking differently: Take 2 mg by mouth See admin instructions. TAKE  ONE TABLET BY MOUTH EVERY NIGHT AT BEDTIME) 90 tablet 0  . FLUoxetine (PROZAC) 10 MG capsule Take 10 mg by mouth every morning. Takes with a 73m capsule for her total dose of 337m    . Marland KitchenLUoxetine (PROZAC) 20 MG capsule Take 20 mg by mouth every morning. Takes with a 1053mapsule for her total dose of 34m64m  . ibMarland Kitchenprofen (ADVIL,MOTRIN) 200 MG tablet Take 400-600 mg by mouth every 6 (six) hours as needed for mild pain or moderate pain.    . loMarland Kitchenartan-hydrochlorothiazide (HYZAAR) 100-12.5 MG tablet Take 1 tablet by mouth  daily.     Marland Kitchen MELATONIN PO Take 10 mg by mouth at bedtime.     . metFORMIN (GLUCOPHAGE-XR) 500 MG 24 hr tablet Take 500 mg by mouth daily with breakfast.   3  . modafinil (PROVIGIL) 200 MG tablet Take 1 tablet (200 mg total) by mouth every morning. (Patient not taking: Reported on 08/02/2019) 30 tablet 0  . Multiple Vitamin (MULTIVITAMIN WITH MINERALS) TABS Take 1 tablet by mouth every morning.     . naproxen sodium (ALEVE) 220 MG tablet Take 220 mg by mouth daily as needed (pain).     . ondansetron (ZOFRAN) 8 MG tablet Take 1 tablet (8 mg total) by mouth every 8 (eight) hours  as needed for refractory nausea / vomiting. Start on day 3 after carboplatin chemo. 30 tablet 1  . prochlorperazine (COMPAZINE) 10 MG tablet Take 1 tablet (10 mg total) by mouth every 6 (six) hours as needed (Nausea or vomiting). 30 tablet 1  . rosuvastatin (CRESTOR) 20 MG tablet Take 20 mg by mouth every morning.      No current facility-administered medications for this visit.   Facility-Administered Medications Ordered in Other Visits  Medication Dose Route Frequency Provider Last Rate Last Admin  . sodium chloride flush (NS) 0.9 % injection 10 mL  10 mL Intravenous PRN Nancy Marus, MD   10 mL at 05/12/17 0823    PHYSICAL EXAMINATION: ECOG PERFORMANCE STATUS: 1 - Symptomatic but completely ambulatory  Vitals:   09/07/19 1035  BP: 140/88  Pulse: 79  Resp: 18  Temp: 97.9 F (36.6 C)  SpO2: 100%   Filed Weights   09/07/19 1035  Weight: 189 lb (85.7 kg)    GENERAL:alert, no distress and comfortable SKIN: skin color, texture, turgor are normal, no rashes or significant lesions EYES: normal, Conjunctiva are pink and non-injected, sclera clear OROPHARYNX:no exudate, no erythema and lips, buccal mucosa, and tongue normal  NECK: supple, thyroid normal size, non-tender, without nodularity LYMPH:  no palpable lymphadenopathy in the cervical, axillary or inguinal LUNGS: clear to auscultation and percussion with normal breathing effort HEART: regular rate & rhythm and no murmurs and no lower extremity edema ABDOMEN:abdomen soft, non-tender and normal bowel sounds Musculoskeletal:no cyanosis of digits and no clubbing  NEURO: alert & oriented x 3 with fluent speech, no focal motor/sensory deficits  LABORATORY DATA:  I have reviewed the data as listed    Component Value Date/Time   NA 143 09/07/2019 1035   NA 141 12/02/2016 1219   K 4.2 09/07/2019 1035   K 3.7 12/02/2016 1219   CL 108 09/07/2019 1035   CL 103 01/18/2013 1329   CO2 27 09/07/2019 1035   CO2 27 12/02/2016 1219    GLUCOSE 96 09/07/2019 1035   GLUCOSE 98 12/02/2016 1219   GLUCOSE 110 (H) 01/18/2013 1329   BUN 14 09/07/2019 1035   BUN 15.3 12/02/2016 1219   CREATININE 0.68 09/07/2019 1035   CREATININE 0.7 12/02/2016 1219   CALCIUM 8.8 (L) 09/07/2019 1035   CALCIUM 10.1 12/02/2016 1219   PROT 6.7 09/07/2019 1035   PROT 7.1 12/02/2016 1219   ALBUMIN 4.0 09/07/2019 1035   ALBUMIN 4.2 12/02/2016 1219   AST 16 09/07/2019 1035   AST 21 12/02/2016 1219   ALT 26 09/07/2019 1035   ALT 27 12/02/2016 1219   ALKPHOS 109 09/07/2019 1035   ALKPHOS 104 12/02/2016 1219   BILITOT 0.3 09/07/2019 1035   BILITOT 0.37 12/02/2016 1219   GFRNONAA >60 09/07/2019 1035  GFRAA >60 09/07/2019 1035    No results found for: SPEP, UPEP  Lab Results  Component Value Date   WBC 5.0 09/07/2019   NEUTROABS 2.7 09/07/2019   HGB 12.6 09/07/2019   HCT 38.7 09/07/2019   MCV 90.6 09/07/2019   PLT 108 (L) 09/07/2019      Chemistry      Component Value Date/Time   NA 143 09/07/2019 1035   NA 141 12/02/2016 1219   K 4.2 09/07/2019 1035   K 3.7 12/02/2016 1219   CL 108 09/07/2019 1035   CL 103 01/18/2013 1329   CO2 27 09/07/2019 1035   CO2 27 12/02/2016 1219   BUN 14 09/07/2019 1035   BUN 15.3 12/02/2016 1219   CREATININE 0.68 09/07/2019 1035   CREATININE 0.7 12/02/2016 1219   GLU 236 12/13/2012 1558      Component Value Date/Time   CALCIUM 8.8 (L) 09/07/2019 1035   CALCIUM 10.1 12/02/2016 1219   ALKPHOS 109 09/07/2019 1035   ALKPHOS 104 12/02/2016 1219   AST 16 09/07/2019 1035   AST 21 12/02/2016 1219   ALT 26 09/07/2019 1035   ALT 27 12/02/2016 1219   BILITOT 0.3 09/07/2019 1035   BILITOT 0.37 12/02/2016 1219       RADIOGRAPHIC STUDIES: I have personally reviewed the radiological images as listed and agreed with the findings in the report. DG BONE DENSITY (DXA)  Result Date: 08/16/2019 EXAM: DUAL X-RAY ABSORPTIOMETRY (DXA) FOR BONE MINERAL DENSITY IMPRESSION: Referring Physician:  [facility] MCNEILL  Your patient completed a BMD test using Lunar IDXA DXA system ( analysis version: 16 ) manufactured by [employer]. Technologist: WLS PATIENT: Name: Kristin Webb, Kristin Webb Patient ID: [id number] Birth Date: 08/30/1945 Height: 59.0 in. Sex: Female Measured: 08/16/2019 Weight: 188.8 lbs. Indications: Advanced Age, Bilateral Ovariectomy (65.51), Caucasian, Estrogen Deficient, Hysterectomy, Low Calcium Intake (269.3), Ovarian Cancer, Postmenopausal, Secondary Osteoporosis Fractures: None Treatments: Vitamin D (E933.5) ASSESSMENT: The BMD measured at Femur Neck Left is 0.809 g/cm2 with a T-score of -1.6. This patient is considered osteopenic according to [facility]) criteria. The scan quality is limited by patient body habitus. Site Region Measured Date Measured Age YA BMD Significant CHANGE T-score AP Spine  L1-L4      9/16/2019    72.6         -1.0    1.063 g/cm2 DualFemur Neck Left  08/16/2019    72.6         -1.6    0.809 g/cm2 DualFemur Total Mean 08/16/2019    72.6         -0.5    0.944 g/cm2 World Health Organization [facility]) criteria for post-menopausal, Caucasian Women: Normal       T-score at or above -1 SD Osteopenia   T-score between -1 and -2.5 SD Osteoporosis T-score at or below -2.5 SD RECOMMENDATION: 1. All patients should optimize calcium and vitamin D intake. 2. Consider FDA approved medical therapies in postmenopausal women and men aged 68 years and older, based on the following: a. A hip or vertebral (clinical or morphometric) fracture b. T- score < or = -2.5 at the femoral neck or spine after appropriate evaluation to exclude secondary causes c. Low bone mass (T-score between -1.0 and -2.5 at the femoral neck or spine) and a 10 year probability of a hip fracture > or = 3% or a 10 year probability of a major osteoporosis-related fracture > or = 20% based on the [place]-adapted WHO algorithm d. Clinician judgment and/or patient  preferences may indicate treatment for people with 10-year fracture  probabilities above or below these levels FOLLOW-UP: Patients with diagnosis of osteoporosis or at high risk for fracture should have regular bone mineral density tests. For patients eligible for Medicare, routine testing is allowed once every 2 years. The testing frequency can be increased to one year for patients who have rapidly progressing disease, those who are receiving or discontinuing medical therapy to restore bone mass, or have additional risk factors. I have reviewed this report and agree with the above findings. [facility] FRAX* 10-year Probability of Fracture Based on femoral neck BMD: DualFemur (Left) Major Osteoporotic Fracture: 10.0% Hip Fracture:                1.7% Population:                  [place] (Caucasian) Risk Factors:                Secondary Osteoporosis *FRAX is a Materials engineer of the [employer] for Metabolic Bone Disease, a Standard (WHO) [facility]. ASSESSMENT: The probability of a major osteoporotic fracture is 10.0 % within the next ten years. The probability of a hip fracture is 1.7 % within the next ten years. Electronically Signed   By: Lowella Grip III M.D.   On: 08/16/2019 08:33   MM 3D SCREEN BREAST BILATERAL  Result Date: 08/16/2019 CLINICAL DATA:  Screening. EXAM: DIGITAL SCREENING BILATERAL MAMMOGRAM WITH TOMO AND CAD COMPARISON:  Previous exam(s). ACR Breast Density Category c: The breast tissue is heterogeneously dense, which may obscure small masses. FINDINGS: In the left breast, a possible mass warrants further evaluation. In the right breast, no findings suspicious for malignancy. Images were processed with CAD. IMPRESSION: Further evaluation is suggested for possible mass in the left breast. RECOMMENDATION: Diagnostic mammogram and possibly ultrasound of the left breast. (Code:[id number]) The patient will be contacted regarding the findings, and additional imaging will be scheduled. BI-RADS  CATEGORY  0: Incomplete. Need additional imaging evaluation and/or prior mammograms for comparison. Electronically Signed   By: Kristopher Oppenheim M.D.   On: 08/16/2019 08:30

## 2019-09-08 NOTE — Telephone Encounter (Signed)
I talk with patient regarding schedule  

## 2019-09-08 NOTE — Assessment & Plan Note (Signed)
She had exacerbation of fibromyalgia pain from recent treatment She will continue conservative approach with over-the-counter analgesics

## 2019-09-08 NOTE — Assessment & Plan Note (Addendum)
She tolerated last cycle of treatment well without major side effects She had mild exacerbation of her fibromyalgia pain but otherwise tolerable We will proceed with treatment without dose adjustment I recommend minimum 3 cycles of therapy before repeat imaging study Her tumor marker was not elevated when she had cancer recurrence and hence I do not plan to continue Ca1 25 monitoring

## 2019-09-08 NOTE — Assessment & Plan Note (Signed)
She denies significant elevated blood sugar while on treatment Observe for now

## 2019-09-14 ENCOUNTER — Encounter: Payer: Self-pay | Admitting: Neurology

## 2019-09-19 DIAGNOSIS — H2513 Age-related nuclear cataract, bilateral: Secondary | ICD-10-CM | POA: Diagnosis not present

## 2019-09-19 DIAGNOSIS — H5111 Convergence insufficiency: Secondary | ICD-10-CM | POA: Diagnosis not present

## 2019-09-19 DIAGNOSIS — H43813 Vitreous degeneration, bilateral: Secondary | ICD-10-CM | POA: Diagnosis not present

## 2019-09-19 DIAGNOSIS — H04123 Dry eye syndrome of bilateral lacrimal glands: Secondary | ICD-10-CM | POA: Diagnosis not present

## 2019-09-19 DIAGNOSIS — E119 Type 2 diabetes mellitus without complications: Secondary | ICD-10-CM | POA: Diagnosis not present

## 2019-09-20 ENCOUNTER — Ambulatory Visit: Payer: Medicare HMO | Admitting: Nutrition

## 2019-09-20 DIAGNOSIS — E782 Mixed hyperlipidemia: Secondary | ICD-10-CM | POA: Diagnosis not present

## 2019-09-20 DIAGNOSIS — I10 Essential (primary) hypertension: Secondary | ICD-10-CM | POA: Diagnosis not present

## 2019-09-20 DIAGNOSIS — E1159 Type 2 diabetes mellitus with other circulatory complications: Secondary | ICD-10-CM | POA: Diagnosis not present

## 2019-09-20 DIAGNOSIS — M159 Polyosteoarthritis, unspecified: Secondary | ICD-10-CM | POA: Diagnosis not present

## 2019-09-20 NOTE — Consult Note (Signed)
Contacted patient by telephone at RN request. Patient is a 74 year old female diagnosed with metastatic fallopian tube cancer.  She is receiving carbo/Taxol treatment.  Her doctor is Dr. Alvy Bimler.  Past medical history includes fibromyalgia, hypertension, hyperlipidemia, GERD, diarrhea, diabetes, and constipation.  Medications include Decadron, Prozac, Glucophage XR, multivitamin, Zofran, and Compazine.  Labs include glucose 96.  Height: 5 feet 2 inches. Weight: 189 pounds January 28. Usual body weight: 196 pounds in February 2020. BMI: 34.57.  Patient reports she is tolerating her chemotherapy without any difficulty. Reports she gains weight during treatment. States she is trying to figure out how to manage her blood sugars. Reports she has cut out all white foods. She tries to limit sugars and count carbohydrates. She uses Stevia as a sweetener. Reports drinking 16 glasses of water daily.  Nutrition diagnosis: Food and nutrition related knowledge deficit related to cancer and associated treatments as evidenced by no prior need for nutrition related information.  Intervention: Educated patient to avoid concentrated sweets, especially around chemotherapy. Encourage patient to count carbohydrates and limit portion sizes. Discouraged patient eliminating all carbohydrates from her diet. Will mail fact sheets on counting carbohydrates to clarify portion sizes and no concentrated sweets. We will provide contact information.  Monitoring, evaluation, goals: Patient will tolerate adequate calories and protein to achieve weight stabilization and adequate glycemic control.  Nutrition diagnosis resolved.  No follow-up required. Patient was educated to make an appointment with nutrition and diabetes education services for further diabetic education if needed.  **Disclaimer: This note was dictated with voice recognition software. Similar sounding words can inadvertently be transcribed and this  note may contain transcription errors which may not have been corrected upon publication of note.**

## 2019-09-25 ENCOUNTER — Encounter: Payer: Self-pay | Admitting: Family Medicine

## 2019-09-25 ENCOUNTER — Other Ambulatory Visit: Payer: Self-pay

## 2019-09-25 ENCOUNTER — Ambulatory Visit: Payer: Medicare HMO | Admitting: Family Medicine

## 2019-09-25 VITALS — BP 122/76 | HR 77 | Temp 97.7°F | Ht 62.0 in | Wt 188.0 lb

## 2019-09-25 DIAGNOSIS — G4733 Obstructive sleep apnea (adult) (pediatric): Secondary | ICD-10-CM

## 2019-09-25 DIAGNOSIS — Z9989 Dependence on other enabling machines and devices: Secondary | ICD-10-CM | POA: Diagnosis not present

## 2019-09-25 DIAGNOSIS — G4719 Other hypersomnia: Secondary | ICD-10-CM

## 2019-09-25 DIAGNOSIS — R530 Neoplastic (malignant) related fatigue: Secondary | ICD-10-CM | POA: Diagnosis not present

## 2019-09-25 DIAGNOSIS — F5102 Adjustment insomnia: Secondary | ICD-10-CM

## 2019-09-25 NOTE — Progress Notes (Signed)
PATIENT: Kristin Webb DOB: 1946/02/03  REASON FOR VISIT: follow up HISTORY FROM: patient  Chief Complaint  Patient presents with  . Follow-up    4 mon f/u. Alone. Rm 1. Patient mentioned that she has been having some irratic sleeping and trouble falling asleep but she thinks it may be related to her chemo. Patient has some spots under her eyes that she is concerned about. She's not sure if its related to the mask or not.      HISTORY OF PRESENT ILLNESS: Today 09/25/19 Kristin Webb is a 74 y.o. female here today for follow up for OSA on CPAP.  Overall, she is doing fairly well with her CPAP machine.  She has recently restarted chemotherapy for stage IV ovarian cancer.  She is anticipating receiving her third of 6 total treatments this week.  She will then have a PET scan to determine how long therapy will need to be continued.  There is also a question of possible need for colon resection as tumor is attached to part of her colon.  She has noted that her skin is more sensitive.  She has bags under her eyes that she feels are pinched with the current full facemask she is using. Currently using ResMed Airfit small FFM.  She does have more difficulty falling asleep.  She is now using melatonin 10 mg and Benadryl 25 mg about an hour before bedtime.  She continues Lunesta to help her stay asleep.  Armodafinil does help with daytime sleepiness.  She does not use this medication on chemotherapy days as she feels there is no benefit.  She does endorse increasing fatigue and feels this is related to her chemotherapy.  Compliance report dated 08/21/2018 08/20/2019 reveals that she used CPAP 30 out of the last 30 days for compliance of 100%.  She used CPAP greater than 4 hours 28 of the last 30 days for compliance of 93%.  Average usage was 8 hours and 44 minutes.  Residual AHI was 1.2 on 16 cm of water and EPR of 1.  There was no significant leak noted.  HISTORY: (copied from Dr Dohmeier's note on  05/23/2019)  HPI:  Kristin Webb is a 74 y.o. female patient with a history of OSA on CPAP -and persistent fatigue, is seen here in a revisit for CPAP follow up/ compliance visit.  She is a Glass blower/designer, has a large social circle. She is living alone with her rescue dog, and is optimistic and happy.  Rv 05-23-2019-I have the pleasure of looking at the data download for Kristin Webb encompassing 30 days before 21 May 2019.  She has been 100% compliant with an average usage time of 8 hours 90 minutes, minimum pressure is 5 maximum pressure is applied is 15 cmH2O with 1 cm expiratory pressure relief.  Residual AHI is 1.4 which is an excellent resolution she has minimal air leaks her 95th percentile pressure is 14.9cm  this may be related to some weight gain. She has used melatonin, benadryl.   Kristin Webb is seen here on 09 May 2018, she has been a highly compliant CPAP user, her downloaded data however have to be combined from 2 different machines.  For general use at home she has an air sense 10 AutoSet machine and then she has a travel CPAP as well.  The average user time on days used is 8 hours and 59 minutes, she has used CPAP in one form or another for 97% of the  recorded time, she is using AutoSet 5-15 cmH2O was 1 cm EPR level the 95th percentile pressure required is 14.5 which straddles the upper limit.  Residual AHI is 0.8 which is excellent and there are no major air leaks.  She has gained weight since I saw her last and her BMI rose by over 4 points this may explain why she needs a higher setting at this time.  Her Epworth Sleepiness Scale was endorsed at 13 out of 24 possible points and her fatigue severity at 44 out of 63 possible points, the geriatric depression scale was endorsed at 1 out of 15 points.  Interval history from 05/05/2017, Kristin Webb is doing well, she is living by herself with her dog, recently had some home restoration to do. This added a little bit  of hectic to her life, however she is doing well and her counselor has not progressed. She is followed by a new oncologist, [name]. She underwent another HST following significant weight gain, AHI was 9.1 /hr. She has many desaturations and was placed on auto CPAP in response to high EDS with mild apnea.  Her compliance to CPAP with excellent at 90% with an average use of 8 hours and 32 minutes, she is using an AutoSet between 5 and 15 cm water was 1 cm EPR and her residual AHI is 1.9. This is excellent resolution of her apnea. The 95th percentile pressure is 14.8, there were no central apneas emerging, there is no need to adjust the machine in any way. She endorsed the Epworth Sleepiness Scale at 9 points, fatigue severity at 44 points and the geriatric depression score is 1 out of 15 points. She continues to participate in a grief group after her husband's death. She started Yoga, Zen knitting and fosters dogs.   12-12-13,Since I have seen her last this patient was diagnosed with ovarian cancer, underwent surgery and chemotherapy. She currently struggles with a weak  abdominal and core musculatur  and it feels to her she had an abdominal hernia. She has tried a corsett , but this has aggravated her bulging discs in her spine.  The patient was originally referred by Dr. Sheryn Webb at the time with fatigue , pain, excessive sleepiness and burning mouth syndrome. In 2010 she developed transiently left mouth droop, and a TIA/ CVA work up was negative. EMG and nerve conduction studies were unrevealing , a rheumatology consult,  pain specialist , neurosurgical evaluation were unrevealing .  Labs revealed a vitamin D deficiency,  normal TSH and CMET, CBC.  Her neurosurgery consult for back pain was without results.  She was referred to me for a sleep consultation in 2009. The patient was diagnosed with sleep apnea on 03-07-08 with an AHI of 24.3 and in REM AHI of 82. Her BMI at the time was 37.9 , she was  titrated to 10 cm water pressure on CPAP  which relieved her AHI but she still snored.  [name]'s newest  sleep study dated 11-26-13 whowed  residual apnea. The study was therefore a normal polysomnography and not split into a titration parts. The AHI was 0.9, the RDI was 2.4 and the oxygen nadir was 87% time and his saturation was 33.6 minutes at or below 90% saturation of oxygen. Heart which was regular, normal sinus rhythm prevailed. She still sleeps better with Lunesta 2 mg could not get a good result from 1 mg pills. Fragmentation of sleep, alpha intrusion can be seen in chronic pain and fatigue.She  is not a coffee drinker, there is no caffeine to be cut out. There is no pain in sleep. Sleep psychology referral is problematic with [facility]. Sleep hygiene was discussed.   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 She has compliantly  used a  Original machine,  New the CPAP  machine seems to have failed. I am unable to obtain any data. In addition the patient had remained excessively fatigued and daytime sleepy. Possibly , this could have been a paraneoplastic manifestation of ovarian malignancy. She has been using Nuvigil as tolerated which has given her improvement in life quality and energy.   The last CPAP  download was on 05-06-12 with the 95% percentile pressure of 12 cm water, the residual AHI of 0.5 and an average of 5.9 hours of night-user time for CPAP.  Interval history 12-24-14,Mrs. 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 . Mrs. Denherder since being diagnosed with ovarian cancer has underwent a lot of emotional changes as well. Her oncologist, Dr. Marko Plume, feels that some of the nausea is GERD-   So far since last week has not been a palpable results. The patient has also tried to wean off her sleep aids but became panicked and insomniac again.Mr.  Masri had been admitted to the hospital after a spot on his lung was incidentally found , while being evaluated for kidney stones. He went into acute respiratory distress was placed on oxygen oxygen and readmitted to the hospital but never recovered. And during that time she needed to return to her previous insomnia regimen. She is under financial distress, had a lot of difficulties since her husbands death.  CD Interval history from 05/18/2016, Mrs. Emmert has been able to cut down on her Lunesta dose from 2 mg to 1 mg and uses if necessary Nuvigil to drive. She has slept usually uninterrupted as of last summer also thinks have changed again in 2017 now she begun in August to wake up 3-4 times a week in AM with a headache, very unusual for her. Nausea is associated with these headaches, no photophobia or phonophobia is reported. And as of August she has needed and used Nuvigil 125 g daily again. Not waking up in the night, told that she snores by a roommate during a church trip. Her fatigue is reminiscent of the times when she had suffered from sleep apnea.   CD Interval history from 07/15/2016, Mrs. Crisan is here to see the results of her recent home sleep test, performed on 06/10/2016. Her AHI was 2.2 her RDI was 5 per hour there was no significant sleep apnea noted no oxygen desaturation was found, and she did not have tachycardia or bradycardia arrhythmia. Her fatigue has remained her Epworth sleepiness score has actually increased to 12 point from 10 during our last visit. She states that she often only gets up in the morning before because she has to let the dog out but then feels that she needs another hour of sleep afterwards. She also suffers from dry mouth and dry eyes. There is a possible autoimmune component, and she does suffer from fibromyalgia.  She does not endorse a significant depression score since she lost her husband she has been grieving but she has not "fallen into a deep  hole". She sleeps with the help of Lunesta 1 mg and Benadryl, she has failed Ambien (which caused her to sleep walk). She was  also significantly more drowsy in daytime. She could neither tolerate Ambien 10 not 5 mg.   REVIEW OF SYSTEMS: Out of a complete 14 system review of symptoms, the patient complains only of the following symptoms, insomnia, apnea, frequent waking, daytime sleepiness, snoring, dizziness, headaches, weakness since starting chemo and all other reviewed systems are negative.  Epworth Sleepiness Scale: 12 Fatigue severity scale: 59  ALLERGIES: Allergies  Allergen Reactions  . Ambien [Zolpidem Tartrate] Other (See Comments)    Sleep walking  . Bee Venom Anaphylaxis and Hives    Difficulty breathing, carries an EPI-pen  . Cortisone Nausea And Vomiting, Swelling and Other (See Comments)    INJECTED CORTISONE ONLY, "sick to my stomach, throwing up, hand swelling, and pain."    HOME MEDICATIONS: Outpatient Medications Prior to Visit  Medication Sig Dispense Refill  . Armodafinil 250 MG tablet TAKE ONE TABLET BY MOUTH EVERY DAY (Patient taking differently: Take 250 mg by mouth every morning. ) 30 tablet 5  . Carboxymethylcellulose Sodium (EYE DROPS OP) Apply 2 drops to eye daily as needed (dry eye).    . cholecalciferol (VITAMIN D3) 25 MCG (1000 UT) tablet Take 1,000 Units by mouth daily.    . colestipol (COLESTID) 1 g tablet Take 0.5-1 g by mouth daily. Pt taking 1/2 to 1 tablet daily depending on meal choice; taken 1 hour after morning meds , and can not eat for another hour    . dexamethasone (DECADRON) 4 MG tablet Take 2 tabs at the night before and 2 tabs the morning of chemotherapy, every 3 weeks, by mouth x 6 cycles, please dispense 24 pills 24 tablet 6  . EPINEPHrine (EPIPEN 2-PAK) 0.3 mg/0.3 mL DEVI Inject 0.3 mg into the muscle once as needed (For bee stings.). Reported on 01/20/2016    . eszopiclone (LUNESTA) 2 MG TABS tablet TAKE ONE-HALF TO ONE TABLET BY MOUTH  EVERY NIGHT AT BEDTIME AS NEEDED (Patient taking differently: Take 2 mg by mouth See admin instructions. TAKE  ONE TABLET BY MOUTH EVERY NIGHT AT BEDTIME) 90 tablet 0  . FLUoxetine (PROZAC) 10 MG capsule Take 10 mg by mouth every morning. Takes with a 20mg  capsule for her total dose of 30mg .    Marland Kitchen FLUoxetine (PROZAC) 20 MG capsule Take 20 mg by mouth every morning. Takes with a 10mg  capsule for her total dose of 30mg .    . ibuprofen (ADVIL,MOTRIN) 200 MG tablet Take 400-600 mg by mouth every 6 (six) hours as needed for mild pain or moderate pain.    Marland Kitchen losartan-hydrochlorothiazide (HYZAAR) 100-12.5 MG tablet Take 1 tablet by mouth daily.     Marland Kitchen MELATONIN PO Take 10 mg by mouth at bedtime.     . metFORMIN (GLUCOPHAGE-XR) 500 MG 24 hr tablet Take 500 mg by mouth daily with breakfast.   3  . Multiple Vitamin (MULTIVITAMIN WITH MINERALS) TABS Take 1 tablet by mouth every morning.     . naproxen sodium (ALEVE) 220 MG tablet Take 220 mg by mouth daily as needed (pain).     . ondansetron (ZOFRAN) 8 MG tablet Take 1 tablet (8 mg total) by mouth every 8 (eight) hours as needed for refractory nausea / vomiting. Start on day 3 after carboplatin chemo. 30 tablet 1  . prochlorperazine (COMPAZINE) 10 MG tablet Take 1 tablet (10 mg total) by mouth every 6 (six) hours as needed (Nausea or vomiting). 30 tablet 1  . rosuvastatin (CRESTOR) 20 MG tablet Take 20 mg by mouth every morning.     Marland Kitchen  modafinil (PROVIGIL) 200 MG tablet Take 1 tablet (200 mg total) by mouth every morning. (Patient not taking: Reported on 08/02/2019) 30 tablet 0   Facility-Administered Medications Prior to Visit  Medication Dose Route Frequency Provider Last Rate Last Admin  . sodium chloride flush (NS) 0.9 % injection 10 mL  10 mL Intravenous PRN Nancy Marus, MD   10 mL at 05/12/17 [id number]    PAST MEDICAL HISTORY: Past Medical History:  Diagnosis Date  . Burning mouth syndrome   . Chronic fatigue   . Constipation   . Diabetes ([place])  05/09/2018  . Diarrhea    in the past after gallbladder removal  . Fibromyalgia   . GERD (gastroesophageal reflux disease)   . History of kidney stones 07/2015  . Hyperlipidemia   . Hypertension    borderline not on meds   . Insomnia secondary to depression with anxiety   . Lumbar disc disease   . Ovarian cancer ([place]) 2014  . Pelvic mass in female   . Pneumonia    hx of pneumonia as an infant  . PONV (postoperative nausea and vomiting)    pain from gas hernia 2017  . Shortness of breath    with exertion   . Sleep apnea    uses CPAP  . Yeast infection     PAST SURGICAL HISTORY: Past Surgical History:  Procedure Laterality Date  . ABDOMINAL HYSTERECTOMY     early 86s  . APPENDECTOMY     WITH DEBULKING/BSO  . CHOLECYSTECTOMY     early 67s  . CYSTOSCOPY W/ URETERAL STENT PLACEMENT Left 07/09/2015   DUE TO NEPHROLITHIASIS Procedure: CYSTOSCOPY WITH LEFT RETROGRADE PYELOGRAM/ LEFT URETERAL STENT PLACEMENT;  Surgeon: Ardis Hughs, MD;  Location: [facility];  Service: Urology;  Laterality: Left;  . HERNIA REPAIR    . INCISIONAL HERNIA REPAIR N/A 12/25/2015   WITH MESH Procedure:  INCISIONAL HERNIA REPAIR;  Surgeon: Ralene Ok, MD;  Location: [facility];  Service: General;  Laterality: N/A;  . INCISIONAL HERNIA REPAIR N/A 10/21/2018   Procedure: LAPAROSCOPIC INCISIONAL HERNIA REPAIR WITH MESH;  Surgeon: Ralene Ok, MD;  Location: [facility];  Service: General;  Laterality: N/A;  . INSERTION OF MESH N/A 12/25/2015   Procedure: INSERTION OF MESH;  Surgeon: Ralene Ok, MD;  Location: [facility];  Service: General;  Laterality: N/A;  . LAPAROTOMY Bilateral 11/08/2012   Procedure: EXPLORATORY LAPAROTOMY BILATERAL SALPINGO OOPHORECTOMY TUMOR DEBULKING ;  Surgeon: Imagene Gurney A. Alycia Rossetti, MD;  Location: [facility];  Service: Gynecology;  Laterality: Bilateral;  APPENDECTOMY / OMENTECTOMY  . LAPAROTOMY N/A 12/25/2015   Procedure: EXPLORATORY LAPAROTOMY;  Surgeon: Ralene Ok, MD;  Location: [facility];  Service: General;  Laterality: N/A;  . LYSIS OF ADHESION N/A 12/25/2015   Procedure: LYSIS OF ADHESION;  Surgeon: Ralene Ok, MD;  Location: [facility];  Service: General;  Laterality: N/A;  . PARTIAL HYSTERECTOMY  2006  . TRIGGER FINGER RELEASE      FAMILY HISTORY: Family History  Problem Relation Age of Onset  . Lung cancer Mother 46  . High blood pressure Mother   . High Cholesterol Mother   . Lung cancer Father 67       2 ppd smoker  . Parkinson's disease Father   . Kidney disease Other     SOCIAL HISTORY: Social History   Socioeconomic History  . Marital status: Widowed    Spouse name: [name]  . Number of children: 2  . Years of education: Master's  .  Highest education level: Not on file  Occupational History  . Not on file  Tobacco Use  . Smoking status: Never Smoker  . Smokeless tobacco: Never Used  Substance and Sexual Activity  . Alcohol use: No  . Drug use: No  . Sexual activity: Not on file  Other Topics Concern  . Not on file  Social History Narrative   Patient is married [name]). Husband passed away in November 08, 2014   Patient has 2 children by birth and 13 children all together.   Patient has a Oceanographer.   Patient is right-handed.   Patient drinks very little caffeine.         Social Determinants of Health   Financial Resource Strain:   . Difficulty of Paying Living Expenses: Not on file  Food Insecurity:   . Worried About Charity fundraiser in the Last Year: Not on file  . Ran Out of Food in the Last Year: Not on file  Transportation Needs:   . Lack of Transportation (Medical): Not on file  . Lack of Transportation (Non-Medical): Not on file  Physical Activity:   . Days of Exercise per Week: Not on file  . Minutes of Exercise per Session: Not on file  Stress:   . Feeling of Stress : Not on file  Social Connections:   . Frequency of Communication with Friends and Family: Not on file  . Frequency of Social Gatherings with Friends and  Family: Not on file  . Attends Religious Services: Not on file  . Active Member of Clubs or Organizations: Not on file  . Attends Archivist Meetings: Not on file  . Marital Status: Not on file  Intimate Partner Violence:   . Fear of Current or Ex-Partner: Not on file  . Emotionally Abused: Not on file  . Physically Abused: Not on file  . Sexually Abused: Not on file      PHYSICAL EXAM  Vitals:   09/25/19 0712  BP: 122/76  Pulse: 77  Temp: 97.7 F (36.5 C)  TempSrc: Oral  Weight: 188 lb (85.3 kg)  Height: 5\' 2"  (1.575 m)   Body mass index is 34.39 kg/m.  Generalized: Well developed, in no acute distress  Cardiology: normal rate and rhythm, no murmur noted Respiratory: Clear to auscultation bilaterally  Neurological examination  Mentation: Alert oriented to time, place, history taking. Follows all commands speech and language fluent Cranial nerve II-XII: Pupils were equal round reactive to light. Extraocular movements were full, visual field were full  Motor: The motor testing reveals 5 over 5 strength of all 4 extremities. Good symmetric motor tone is noted throughout.  Gait and station: Gait is normal.    DIAGNOSTIC DATA (LABS, IMAGING, TESTING) - I reviewed patient records, labs, notes, testing and imaging myself where available.  No flowsheet data found.   Lab Results  Component Value Date   WBC 5.0 09/07/2019   HGB 12.6 09/07/2019   HCT 38.7 09/07/2019   MCV 90.6 09/07/2019   PLT 108 (L) 09/07/2019      Component Value Date/Time   NA 143 09/07/2019 1035   NA 141 12/02/2016 1219   K 4.2 09/07/2019 1035   K 3.7 12/02/2016 1219   CL 108 09/07/2019 1035   CL 103 01/18/2013 1329   CO2 27 09/07/2019 1035   CO2 27 12/02/2016 1219   GLUCOSE 96 09/07/2019 1035   GLUCOSE 98 12/02/2016 1219   GLUCOSE 110 (H) 01/18/2013 1329   BUN 14  09/07/2019 1035   BUN 15.3 12/02/2016 1219   CREATININE 0.68 09/07/2019 1035   CREATININE 0.7 12/02/2016 1219    CALCIUM 8.8 (L) 09/07/2019 1035   CALCIUM 10.1 12/02/2016 1219   PROT 6.7 09/07/2019 1035   PROT 7.1 12/02/2016 1219   ALBUMIN 4.0 09/07/2019 1035   ALBUMIN 4.2 12/02/2016 1219   AST 16 09/07/2019 1035   AST 21 12/02/2016 1219   ALT 26 09/07/2019 1035   ALT 27 12/02/2016 1219   ALKPHOS 109 09/07/2019 1035   ALKPHOS 104 12/02/2016 1219   BILITOT 0.3 09/07/2019 1035   BILITOT 0.37 12/02/2016 1219   GFRNONAA >60 09/07/2019 1035   GFRAA >60 09/07/2019 1035   No results found for: CHOL, HDL, LDLCALC, LDLDIRECT, TRIG, CHOLHDL Lab Results  Component Value Date   HGBA1C 6.0 (H) 10/17/2018   No results found for: VITAMINB12 No results found for: TSH     ASSESSMENT AND PLAN 74 y.o. year old female  has a past medical history of Burning mouth syndrome, Chronic fatigue, Constipation, Diabetes ([place]) (05/09/2018), Diarrhea, Fibromyalgia, GERD (gastroesophageal reflux disease), History of kidney stones (07/2015), Hyperlipidemia, Hypertension, Insomnia secondary to depression with anxiety, Lumbar disc disease, Ovarian cancer ([place]) (2014), Pelvic mass in female, Pneumonia, PONV (postoperative nausea and vomiting), Shortness of breath, Sleep apnea, and Yeast infection. here with     ICD-10-CM   1. OSA on CPAP  G47.33 For home use only DME continuous positive airway pressure (CPAP)   Z99.89   2. Neoplastic malignant related fatigue  R53.0   3. Adjustment insomnia  F51.02   4. Excessive daytime sleepiness  G47.19     Overall, John continues to do fairly well with CPAP therapy.  She is having more fatigue, most likely related to chemotherapy treatments.  She also feels that insomnia has worsened due to increased stress levels.  She continues excellent compliance with CPAP therapy.  I do feel that she may benefit from trying a fullface mask that sits under the nose.  This will help prevent the pinching sensation that she feels with her current mask over the bridge of her nose.  We will send new  orders to the DME company today for a mask refitting.  She was encouraged to continue using CPAP nightly and for greater than 4 hours each night.  She may continue melatonin and Benadryl as needed for insomnia.  We will continue Lunesta as this is helping her stay asleep.  She will continue armodafinil for daytime sleepiness.  We have discussed incorporating exercise as tolerated.  She will call us with any new concerns.  She will follow-up with [place] in 1 year, sooner if needed.  She verbalizes understanding and agreement with this plan.   Orders Placed This Encounter  Procedures  . For home use only DME continuous positive airway pressure (CPAP)    Patient would like to try FFM that sits under nose.    Order Specific Question:   Length of Need    Answer:   Lifetime    Order Specific Question:   Patient has OSA or probable OSA    Answer:   Yes    Order Specific Question:   Is the patient currently using CPAP in the home    Answer:   Yes    Order Specific Question:   Settings    Answer:   Other see comments    Order Specific Question:   CPAP supplies needed    Answer:   Mask, headgear, cushions,  filters, heated tubing and water chamber     No orders of the defined types were placed in this encounter.     I spent 15 minutes with the patient. 50% of this time was spent counseling and educating patient on plan of care and medications.    Debbora Presto, FNP-C 09/25/2019, 8:47 AM [facility] [address], [place], [place] [address] [phone]

## 2019-09-25 NOTE — Patient Instructions (Signed)
Continue CPAP nightly and greater than 4 hours each night   We will send for a mask refitting  Follow up in 1 year, sooner if needed    Sleep Apnea Sleep apnea affects breathing during sleep. It causes breathing to stop for a short time or to become shallow. It can also increase the risk of:  Heart attack.  Stroke.  Being very overweight (obese).  Diabetes.  Heart failure.  Irregular heartbeat. The goal of treatment is to help you breathe normally again. What are the causes? There are three kinds of sleep apnea:  Obstructive sleep apnea. This is caused by a blocked or collapsed airway.  Central sleep apnea. This happens when the brain does not send the right signals to the muscles that control breathing.  Mixed sleep apnea. This is a combination of obstructive and central sleep apnea. The most common cause of this condition is a collapsed or blocked airway. This can happen if:  Your throat muscles are too relaxed.  Your tongue and tonsils are too large.  You are overweight.  Your airway is too small. What increases the risk?  Being overweight.  Smoking.  Having a small airway.  Being older.  Being female.  Drinking alcohol.  Taking medicines to calm yourself (sedatives or tranquilizers).  Having family members with the condition. What are the signs or symptoms?  Trouble staying asleep.  Being sleepy or tired during the day.  Getting angry a lot.  Loud snoring.  Headaches in the morning.  Not being able to focus your mind (concentrate).  Forgetting things.  Less interest in sex.  Mood swings.  Personality changes.  Feelings of sadness (depression).  Waking up a lot during the night to pee (urinate).  Dry mouth.  Sore throat. How is this diagnosed?  Your medical history.  A physical exam.  A test that is done when you are sleeping (sleep study). The test is most often done in a sleep lab but may also be done at home. How is this  treated?   Sleeping on your side.  Using a medicine to get rid of mucus in your nose (decongestant).  Avoiding the use of alcohol, medicines to help you relax, or certain pain medicines (narcotics).  Losing weight, if needed.  Changing your diet.  Not smoking.  Using a machine to open your airway while you sleep, such as: ? An oral appliance. This is a mouthpiece that shifts your lower jaw forward. ? A CPAP device. This device blows air through a mask when you breathe out (exhale). ? An EPAP device. This has valves that you put in each nostril. ? A BPAP device. This device blows air through a mask when you breathe in (inhale) and breathe out.  Having surgery if other treatments do not work. It is important to get treatment for sleep apnea. Without treatment, it can lead to:  High blood pressure.  Coronary artery disease.  In men, not being able to have an erection (impotence).  Reduced thinking ability. Follow these instructions at home: Lifestyle  Make changes that your doctor recommends.  Eat a healthy diet.  Lose weight if needed.  Avoid alcohol, medicines to help you relax, and some pain medicines.  Do not use any products that contain nicotine or tobacco, such as cigarettes, e-cigarettes, and chewing tobacco. If you need help quitting, ask your doctor. General instructions  Take over-the-counter and prescription medicines only as told by your doctor.  If you were given a  machine to use while you sleep, use it only as told by your doctor.  If you are having surgery, make sure to tell your doctor you have sleep apnea. You may need to bring your device with you.  Keep all follow-up visits as told by your doctor. This is important. Contact a doctor if:  The machine that you were given to use during sleep bothers you or does not seem to be working.  You do not get better.  You get worse. Get help right away if:  Your chest hurts.  You have trouble  breathing in enough air.  You have an uncomfortable feeling in your back, arms, or stomach.  You have trouble talking.  One side of your body feels weak.  A part of your face is hanging down. These symptoms may be an emergency. Do not wait to see if the symptoms will go away. Get medical help right away. Call your local emergency services (911 in the [place] Do not drive yourself to the hospital. Summary  This condition affects breathing during sleep.  The most common cause is a collapsed or blocked airway.  The goal of treatment is to help you breathe normally while you sleep. This information is not intended to replace advice given to you by your health care provider. Make sure you discuss any questions you have with your health care provider. Document Revised: 05/13/2018 Document Reviewed: 03/22/2018 Elsevier Patient Education  [employer] [place].

## 2019-09-26 ENCOUNTER — Telehealth: Payer: Self-pay | Admitting: Family Medicine

## 2019-09-26 MED ORDER — ARMODAFINIL 250 MG PO TABS: 250.0000 mg | ORAL_TABLET | Freq: Every day | ORAL | 1 refills | Status: DC

## 2019-09-26 NOTE — Telephone Encounter (Signed)
This pt get her medications [facility] in [facility].  Needing to know if ok to get 90 day supply (they do 90 day supply at a time)?  I

## 2019-09-26 NOTE — Telephone Encounter (Signed)
Kristin Webb called to verify information for the patients prescription please follow up

## 2019-09-26 NOTE — Telephone Encounter (Signed)
Ok to refill Nuvigil for 90 days with 1 refill

## 2019-09-27 ENCOUNTER — Telehealth: Payer: Self-pay | Admitting: *Deleted

## 2019-09-27 NOTE — Telephone Encounter (Signed)
ok 

## 2019-09-27 NOTE — Telephone Encounter (Signed)
Telephone call to patient- she would like to reschedule for Monday morning since the weather is going to be a barrier to getting here tomorrow. She has to arrange a ride and needs a morning appointment. I have cancelled Lab, Flush, MD and infusion for 2/18.

## 2019-09-28 ENCOUNTER — Inpatient Hospital Stay: Payer: Medicare HMO

## 2019-09-28 ENCOUNTER — Telehealth: Payer: Self-pay | Admitting: Hematology and Oncology

## 2019-09-28 ENCOUNTER — Inpatient Hospital Stay: Payer: Medicare HMO | Admitting: Hematology and Oncology

## 2019-09-28 NOTE — Telephone Encounter (Signed)
R/s appt per 2/17 sch message - pt aware of new appt date and time

## 2019-09-29 ENCOUNTER — Encounter: Payer: Self-pay | Admitting: Family Medicine

## 2019-10-02 ENCOUNTER — Inpatient Hospital Stay: Payer: Medicare HMO | Attending: Gynecologic Oncology

## 2019-10-02 ENCOUNTER — Other Ambulatory Visit: Payer: Self-pay

## 2019-10-02 ENCOUNTER — Encounter: Payer: Self-pay | Admitting: Hematology and Oncology

## 2019-10-02 ENCOUNTER — Inpatient Hospital Stay: Payer: Medicare HMO

## 2019-10-02 ENCOUNTER — Inpatient Hospital Stay: Payer: Medicare HMO | Admitting: Hematology and Oncology

## 2019-10-02 VITALS — BP 122/82 | HR 92 | Temp 98.3°F | Resp 18 | Ht 62.0 in

## 2019-10-02 DIAGNOSIS — T451X5A Adverse effect of antineoplastic and immunosuppressive drugs, initial encounter: Secondary | ICD-10-CM | POA: Diagnosis not present

## 2019-10-02 DIAGNOSIS — Z7189 Other specified counseling: Secondary | ICD-10-CM

## 2019-10-02 DIAGNOSIS — C5700 Malignant neoplasm of unspecified fallopian tube: Secondary | ICD-10-CM | POA: Diagnosis not present

## 2019-10-02 DIAGNOSIS — Z7984 Long term (current) use of oral hypoglycemic drugs: Secondary | ICD-10-CM | POA: Diagnosis not present

## 2019-10-02 DIAGNOSIS — C786 Secondary malignant neoplasm of retroperitoneum and peritoneum: Secondary | ICD-10-CM | POA: Insufficient documentation

## 2019-10-02 DIAGNOSIS — G620 Drug-induced polyneuropathy: Secondary | ICD-10-CM

## 2019-10-02 DIAGNOSIS — Z791 Long term (current) use of non-steroidal anti-inflammatories (NSAID): Secondary | ICD-10-CM | POA: Insufficient documentation

## 2019-10-02 DIAGNOSIS — M797 Fibromyalgia: Secondary | ICD-10-CM | POA: Insufficient documentation

## 2019-10-02 DIAGNOSIS — Z5111 Encounter for antineoplastic chemotherapy: Secondary | ICD-10-CM | POA: Insufficient documentation

## 2019-10-02 DIAGNOSIS — Z79899 Other long term (current) drug therapy: Secondary | ICD-10-CM | POA: Diagnosis not present

## 2019-10-02 DIAGNOSIS — D696 Thrombocytopenia, unspecified: Secondary | ICD-10-CM | POA: Diagnosis not present

## 2019-10-02 DIAGNOSIS — C5701 Malignant neoplasm of right fallopian tube: Secondary | ICD-10-CM

## 2019-10-02 LAB — CBC WITH DIFFERENTIAL (CANCER CENTER ONLY)
Abs Immature Granulocytes: 0.02 10*3/uL (ref 0.00–0.07)
Basophils Absolute: 0 10*3/uL (ref 0.0–0.1)
Basophils Relative: 0 %
Eosinophils Absolute: 0 10*3/uL (ref 0.0–0.5)
Eosinophils Relative: 0 %
HCT: 40.5 % (ref 36.0–46.0)
Hemoglobin: 13.3 g/dL (ref 12.0–15.0)
Immature Granulocytes: 1 %
Lymphocytes Relative: 11 %
Lymphs Abs: 0.5 10*3/uL — ABNORMAL LOW (ref 0.7–4.0)
MCH: 30 pg (ref 26.0–34.0)
MCHC: 32.8 g/dL (ref 30.0–36.0)
MCV: 91.4 fL (ref 80.0–100.0)
Monocytes Absolute: 0.1 10*3/uL (ref 0.1–1.0)
Monocytes Relative: 1 %
Neutro Abs: 3.8 10*3/uL (ref 1.7–7.7)
Neutrophils Relative %: 87 %
Platelet Count: 144 10*3/uL — ABNORMAL LOW (ref 150–400)
RBC: 4.43 MIL/uL (ref 3.87–5.11)
RDW: 14.3 % (ref 11.5–15.5)
WBC Count: 4.3 10*3/uL (ref 4.0–10.5)
nRBC: 0 % (ref 0.0–0.2)

## 2019-10-02 LAB — CMP (CANCER CENTER ONLY)
ALT: 25 U/L (ref 0–44)
AST: 18 U/L (ref 15–41)
Albumin: 4.1 g/dL (ref 3.5–5.0)
Alkaline Phosphatase: 116 U/L (ref 38–126)
Anion gap: 13 (ref 5–15)
BUN: 17 mg/dL (ref 8–23)
CO2: 26 mmol/L (ref 22–32)
Calcium: 9.7 mg/dL (ref 8.9–10.3)
Chloride: 104 mmol/L (ref 98–111)
Creatinine: 0.71 mg/dL (ref 0.44–1.00)
GFR, Est AFR Am: >60 mL/min (ref >60)
GFR, Estimated: >60 mL/min (ref >60)
Glucose, Bld: 158 mg/dL — ABNORMAL HIGH (ref 70–99)
Potassium: 3.8 mmol/L (ref 3.5–5.1)
Sodium: 143 mmol/L (ref 135–145)
Total Bilirubin: 0.2 mg/dL — ABNORMAL LOW (ref 0.3–1.2)
Total Protein: 7.4 g/dL (ref 6.5–8.1)

## 2019-10-02 MED ORDER — PALONOSETRON HCL INJECTION 0.25 MG/5ML
0.2500 mg | Freq: Once | INTRAVENOUS | Status: AC
  Administered 2019-10-02: 0.25 mg via INTRAVENOUS

## 2019-10-02 MED ORDER — DIPHENHYDRAMINE HCL 50 MG/ML IJ SOLN
50.0000 mg | Freq: Once | INTRAMUSCULAR | Status: AC
  Administered 2019-10-02: 50 mg via INTRAVENOUS

## 2019-10-02 MED ORDER — SODIUM CHLORIDE 0.9% FLUSH
10.0000 mL | INTRAVENOUS | Status: DC | PRN
  Administered 2019-10-02: 10 mL
  Filled 2019-10-02: qty 10

## 2019-10-02 MED ORDER — DEXAMETHASONE SODIUM PHOSPHATE 10 MG/ML IJ SOLN
10.0000 mg | Freq: Once | INTRAMUSCULAR | Status: AC
  Administered 2019-10-02: 10 mg via INTRAVENOUS

## 2019-10-02 MED ORDER — SODIUM CHLORIDE 0.9 % IV SOLN
556.2000 mg | Freq: Once | INTRAVENOUS | Status: AC
  Administered 2019-10-02: 560 mg via INTRAVENOUS
  Filled 2019-10-02: qty 56

## 2019-10-02 MED ORDER — HEPARIN SOD (PORK) LOCK FLUSH 100 UNIT/ML IV SOLN
500.0000 [IU] | Freq: Once | INTRAVENOUS | Status: AC | PRN
  Administered 2019-10-02: 500 [IU]
  Filled 2019-10-02: qty 5

## 2019-10-02 MED ORDER — PALONOSETRON HCL INJECTION 0.25 MG/5ML
INTRAVENOUS | Status: AC
  Filled 2019-10-02: qty 5

## 2019-10-02 MED ORDER — SODIUM CHLORIDE 0.9 % IV SOLN
Freq: Once | INTRAVENOUS | Status: AC
  Filled 2019-10-02: qty 250

## 2019-10-02 MED ORDER — SODIUM CHLORIDE 0.9 % IV SOLN
150.0000 mg | Freq: Once | INTRAVENOUS | Status: AC
  Administered 2019-10-02: 150 mg via INTRAVENOUS
  Filled 2019-10-02: qty 150

## 2019-10-02 MED ORDER — DEXAMETHASONE SODIUM PHOSPHATE 10 MG/ML IJ SOLN
INTRAMUSCULAR | Status: AC
  Filled 2019-10-02: qty 1

## 2019-10-02 MED ORDER — DIPHENHYDRAMINE HCL 50 MG/ML IJ SOLN
INTRAMUSCULAR | Status: AC
  Filled 2019-10-02: qty 1

## 2019-10-02 MED ORDER — SODIUM CHLORIDE 0.9% FLUSH
10.0000 mL | Freq: Once | INTRAVENOUS | Status: AC
  Administered 2019-10-02: 10 mL
  Filled 2019-10-02: qty 10

## 2019-10-02 MED ORDER — FAMOTIDINE IN NACL 20-0.9 MG/50ML-% IV SOLN
INTRAVENOUS | Status: AC
  Filled 2019-10-02: qty 50

## 2019-10-02 MED ORDER — SODIUM CHLORIDE 0.9 % IV SOLN
175.0000 mg/m2 | Freq: Once | INTRAVENOUS | Status: AC
  Administered 2019-10-02: 336 mg via INTRAVENOUS
  Filled 2019-10-02: qty 56

## 2019-10-02 MED ORDER — FAMOTIDINE IN NACL 20-0.9 MG/50ML-% IV SOLN
20.0000 mg | Freq: Once | INTRAVENOUS | Status: AC
  Administered 2019-10-02: 20 mg via INTRAVENOUS

## 2019-10-02 NOTE — Assessment & Plan Note (Signed)
This is likely due to recent treatment. The patient denies recent history of bleeding such as epistaxis, hematuria or hematochezia. She is asymptomatic from the low platelet count. I will observe for now.  she does not require transfusion now. I will continue the chemotherapy at current dose without dosage adjustment.  If the thrombocytopenia gets progressive worse in the future, I might have to delay her treatment or adjust the chemotherapy dose.   

## 2019-10-02 NOTE — Assessment & Plan Note (Signed)
She tolerated last cycle of treatment well without major side effects She had mild exacerbation of her fibromyalgia pain but otherwise tolerable We will proceed with treatment without dose adjustment I recommend minimum 3 cycles of therapy before repeat imaging study, due next month Her tumor marker was not elevated when she had cancer recurrence and hence I do not plan to continue Ca1 25 monitoring

## 2019-10-02 NOTE — Patient Instructions (Signed)

## 2019-10-02 NOTE — Patient Instructions (Addendum)
[facility] °Discharge Instructions for Patients Receiving Chemotherapy ° °Today you received the following chemotherapy agents Taxol; Carboplatin ° °To help prevent nausea and vomiting after your treatment, we encourage you to take your nausea medication as directed °  °If you develop nausea and vomiting that is not controlled by your nausea medication, call the clinic.  ° °BELOW ARE SYMPTOMS THAT SHOULD BE REPORTED IMMEDIATELY: °· *FEVER GREATER THAN 100.5 F °· *CHILLS WITH OR WITHOUT FEVER °· NAUSEA AND VOMITING THAT IS NOT CONTROLLED WITH YOUR NAUSEA MEDICATION °· *UNUSUAL SHORTNESS OF BREATH °· *UNUSUAL BRUISING OR BLEEDING °· TENDERNESS IN MOUTH AND THROAT WITH OR WITHOUT PRESENCE OF ULCERS °· *URINARY PROBLEMS °· *BOWEL PROBLEMS °· UNUSUAL RASH °Items with * indicate a potential emergency and should be followed up as soon as possible. ° °Feel free to call the clinic should you have any questions or concerns. The clinic phone number is [phone]. ° °Please show the CHEMO ALERT CARD at check-in to the Emergency Department and triage nurse. ° ° °

## 2019-10-02 NOTE — Assessment & Plan Note (Signed)
she has mild peripheral neuropathy, likely related to side effects of treatment. It is only mild, not bothering the patient. I will observe for now If it gets worse in the future, I will consider modifying the dose of the treatment  

## 2019-10-02 NOTE — Progress Notes (Signed)
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 The patient denies recent history of bleeding such as epistaxis, hematuria or hematochezia. She is asymptomatic from the low platelet count. I will observe for now.  she does not require transfusion now. I will continue the chemotherapy at current dose without dosage adjustment.  If the thrombocytopenia gets progressive worse in the future, I might have to delay her treatment or adjust the chemotherapy dose.    Peripheral neuropathy due to chemotherapy [facility]) she has mild peripheral neuropathy, likely related to side effects of treatment. It is only mild, not bothering the patient. I will observe for now If it gets worse in the future, I will consider modifying the dose of the treatment    Orders Placed This Encounter  Procedures  . CT ABDOMEN PELVIS W CONTRAST    Standing Status:   Future    Standing Expiration Date:   10/01/2020    Order Specific Question:   If indicated for the ordered procedure, I authorize the administration of contrast media per Radiology protocol    Answer:   Yes    Order Specific Question:   Preferred imaging location?    Answer:   [facility]    Order Specific Question:   Radiology Contrast Protocol - do NOT remove file path    Answer:    [email or web address]    All questions were answered. The patient knows to call the clinic with any problems, questions or concerns. The total time spent in the appointment was 20 minutes encounter with patients including review of chart and various tests results, discussions about plan of care and coordination of care plan   [name], MD 10/02/2019 3:03 PM  INTERVAL HISTORY: Please see below for problem oriented charting. She returns for treatment and follow-up She feels well She had very slight neuropathy but it does not bother her No recent infection, fever or chills No recent nausea or changes in bowel habits  SUMMARY OF ONCOLOGIC HISTORY: Oncology History Overview Note  Oncologic Summary: 1. 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 Random CA 125 elevation January 2019  Question mesenteric nodules and anterior abdominal wall nodule 3. GeneDx Breast/Ovary Panel negative (including BRCA, MMR's, RAD51 etc) ? [facility] BRACAnalysis  Negative for BRCA1/2 in tumor   Fallopian tube carcinoma ([place])  11/01/2012 Imaging   Ct abdomen 1.  Interval development of large mid abdominal mass highly concerning for right ovarian cancer.  There is mild omental nodularity on the left, and peritoneal disease cannot be completely excluded.  There is no ascites or other evidence of metastatic disease. 2.  Mild associated renal pelvocaliectasis bilaterally without obstruction.  Nonobstructing left renal calculus and a small  right renal angiomyolipoma noted incidentally.   11/08/2012 Pathology Results   1. 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 - HIGH GRADE  SEROUS CARCINOMA, 1.5 CM, CENTERED IN FALLOPIAN TUBE FIMBRIA. - BENIGN FALLOPIAN TUBE WITH NONSPECIFIC CHRONIC INFLAMMATION. 2. Ovary and fallopian tube, left - BENIGN OVARY; NEGATIVE FOR ATYPIA OR MALIGNANCY. - BENIGN FALLOPIAN TUBE; NEGATIVE FOR ATYPIA OR MALIGNANCY. 3. Omentum, resection for tumor - HIGH GRADE CARCINOMA, SEE COMMENT. 4. Appendix, Other than Incidental - FIBROUS OBLITERATION OF APPENDICEAL TIP. - NEGATIVE FOR MALIGNANCY.   11/08/2012 Surgery   Surgery: Exploratory laparotomy, bilateral salpingo-oophorectomy, appendectomy, infacolic omentectomy, optimal debulking  Surgeons:  Paola A. Alycia Rossetti, MD; Lahoma Crocker, MD   Assistant: Caswell Corwin  Pathology: Bilateral fallopian tubes and ovaries to pathology. Appendix as well as omentum. 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  Operative findings: 25 cm right adnexal mass with smooth surface. Surgically absent uterus. Atrophic-appearing left ovary. Normal appearing appendix. Within the omentum there were centimeter nodules scattered throughout the omentum. The remainder of the surfaces were benign.   12/08/2012 Procedure   Impression:  Placement of a subcutaneous port device.  The catheter tip is in the lower SVC and ready to be used.    12/13/2012 - 03/28/2013 Chemotherapy   s/p 6 cycles of paclitaxel and carboplatin   12/13/2012 - 03/28/2013 Chemotherapy   The patient had 6 cycles of carboplatin and Taxol   03/24/2013 Imaging   US abdomen   04/24/2013 Imaging   CT abdomen Interval resection of the large right pelvic and lower abdominal mass lesion with apparent omentectomy.  No evidence for intraperitoneal free fluid on today's study.  No discernible peritoneal lesions.  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   09/07/2013 Genetic Testing   Patient has genetic testing done for BRCA1/2 panel Results revealed patient has no  mutation(s):   07/09/2015 Imaging   CT abdomen 1. 12 mm obstructive calculus at the left ureteropelvic junction with moderate proximal hydronephrosis. 2. 2 small supraumbilical ventral hernias, one containing a short segment of the mid transverse colon and the other containing a short segment of the mid small bowel. There is no associated evidence to suggest bowel incarceration or obstruction at this time. 3. Tiny locule of gas non dependently in the lumen of the urinary bladder. This is presumably iatrogenic related to recent catheterization for urinalysis. Alternatively, this could be seen in the setting of urinary tract infection with gas-forming organisms. Clinical correlation for history of recent catheterization is recommended. 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 5. Status post cholecystectomy. 6. Additional incidental findings, as above.    12/11/2016 Imaging   Ct abdomen 1. No evidence of metastatic ovarian cancer. 2. Recurrent subxiphoid ventral abdominal wall hernia containing transverse colon. No evidence of incarceration or obstruction. 3. Stable incidental findings in the liver and kidneys. No recurrent urinary tract calculus. 4. Progressive lower lumbar spondylosis. 5.  Aortic Atherosclerosis (ICD10-I70.0).    08/30/2017 Imaging   MRI thoracic spine 1. At T5-6 there is a small central disc protrusion contacting the ventral thoracic spinal cord. No central canal or foraminal stenosis. 2. At T9-10 there is a small right paracentral disc protrusion. 3.  No acute osseous injury of the thoracic spine. 4. No aggressive osseous lesion to suggest metastatic disease.   09/24/2017 Tumor Marker   Patient's tumor was  tested for the following markers: CA-125 Results of the tumor marker test revealed 21.7   09/30/2017 Imaging   CT abdomen 1. New small clustered soft tissue nodules in the left lower quadrant in the sigmoid mesentery, largest 1.0 cm, which  could represent recurrent peritoneal tumor implants. No ascites.  2. Midline high ventral abdominal wall hernia containing a portion of the transverse colon is mildly increased in size, and without bowel complication at this time. 3. Chronic findings include: Aortic Atherosclerosis (ICD10-I70.0). Diffuse hepatic steatosis. 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   10/11/2017 PET scan   1. Nodules in the sigmoid mesentery are hypermetabolic and highly worrisome for metastatic disease. 2. Attic steatosis.    11/03/2017 Procedure   Successful CT-guided rectus abdominal muscle mass core biopsy. Path: - FOREIGN BODY GIANT CELL REACTION INVOLVING FIBROADIPOSE TISSUE AND SKELETAL MUSCLE. - NO EVIDENCE OF MALIGNANCY.   11/04/2017 Cancer Staging   Staging form: Fallopian Tube, AJCC 7th Edition - Clinical: FIGO Stage IIIC, calculated as Stage IV (rT3, N0, M1) - Signed by [name], MD on 08/09/2019   02/2018 Imaging   CT: 1. Continued increase in size small peritoneal nodules along the mesenteric border of the proximal sigmoid colon as well as along the serosal surface of the proximal sigmoid colon. 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 2. No evidence of distant disease. 3. Stable large ventral hernia.   05/2018 Imaging   PET: 1. Redemonstration of hypermetabolic nodules within the sigmoid mesocolon. Mild response to therapy relative to CT of 02/24/2018. Mixed response to therapy compared to the most recent PET of 10/11/2017. 2. Hypermetabolism within the right pelvic rectus musculature, increased since the prior PET.  Clinical service requested comparison to the 11/24/2017 CT. Index 10 mm nodule within the sigmoid mesocolon was similar to the 02/24/2018 CT, and as described on that exam, increased from 7 mm on 11/24/2017. More inferior nodule within the mesocolon measures 12 mm today on image 156/4 and 8 mm  on 11/24/2017.   05/2018 Imaging   CT: IMPRESSION: 1. Since 02/24/2018, decreased size of peritoneal nodules centered in the sigmoid mesocolon. 2. No evidence of new or progressive disease. 3. Hepatic steatosis. 4. Subcentimeter right renal angiomyolipoma, similar. 5.  Aortic Atherosclerosis (ICD10-I70.0). 6. Ventral abdominal wall laxity containing transverse colon, similar.   08/2018 Imaging   CT: IMPRESSION: 1. Nodules within the sigmoid mesocolon have decreased in size compared to prior. No new peritoneal or omental nodularity. 2. No evidence local recurrence the pelvis. 3. Ventral hernia contains a segment of transverse colon. No change from prior.     06/19/2019 Imaging   1. No evidence of metastatic disease in the abdomen pelvis. 2. No peritoneal or omental metastasis identified.  No free fluid. 3. Postcholecystectomy and hysterectomy. Comparison exams are made available. Comparison CT 09/08/2018 and 05/27/2018. PET-CT 05/27/2018    There is a nodule within the proximal aspect of the sigmoid colon measuring 2.2 by 2.2 cm. In comparison to prior CTs and PET-CT there was a hypermetabolic nodule at this location on the PET-CT of 08/27/2017 and on the CT of 09/08/2018 there was a small residual nodule. At that time (09/08/2018) the nodule measured 1.4 by 1.3 cm. Therefore this nodule has increased in the interval and concerning for recurrence of a serosal implant. The previous described lymph nodes in the sigmoid mesocolon and more central mesentery are not increased in size and not pathologic by size criteria.  Concern for recurrence of serosal metastasis in the proximal sigmoid colon with a 2 cm enlarging lesion. Lesion extends into the lumen. No evidence of high-grade obstruction. Consider FDG PET scan and/or potential colonoscopy for evaluation.   06/29/2019 PET scan   1. Enlarging serosal implant within the proximal sigmoid colon has intense metabolic activity most consistent  with malignancy. Lesion exhibits a somewhat indolent progression as present on PET-CT scan from 10/11/2017 and 05/27/2018. 2. Focal hypermetabolic activity within the RIGHT rectus muscle just off midline is slightly decreased from comparison exam. This may be benign inflammation related to prior laparotomy, however malignancy not excluded.   07/26/2019 Procedure   She had colonoscopy which showed multiple polyps.  6 polyps were removed from the cecum, measuring 3 to 6 mm in size.  One 12 mm polyp was removed from ascending colon and another 1 measures 7 mm.  One 5 mm polyp was removed from the transverse colon.  There is tumor noted in the sigmoid colon, approximately 35 cm from the anus which was biopsied.  The tumor appeared to be fungating, infiltrative and ulcerated but nonobstructive.  It encompassed approximately one third of the circumference of the lumen.   07/26/2019 Pathology Results   Multiple polyps came back tubular adenomas.  2 ascending polyps came back sessile serrated adenoma months.  Sigmoid colon biopsy confirmed metastatic high-grade serous carcinoma.  The morphology and immunophenotype are consistent with metastatic high-grade serous carcinoma from tubal/ovarian primary site.   08/17/2019 -  Chemotherapy   The patient had carboplatin and taxol     REVIEW OF SYSTEMS:   Constitutional: Denies fevers, chills or abnormal weight loss Eyes: Denies blurriness of vision Ears, nose, mouth, throat, and face: Denies mucositis or sore throat Respiratory: Denies cough, dyspnea or wheezes Cardiovascular: Denies palpitation, chest discomfort or lower extremity swelling Gastrointestinal:  Denies nausea, heartburn or change in bowel habits Skin: Denies abnormal skin rashes Lymphatics: Denies new lymphadenopathy or easy bruising Behavioral/Psych: Mood is stable, no new changes  All other systems were reviewed with the patient and are negative.  I have reviewed the past medical history,  past surgical history, social history and family history with the patient and they are unchanged from previous note.  ALLERGIES:  is allergic to [employer] tartrate]; bee venom; and cortisone.  MEDICATIONS:  Current Outpatient Medications  Medication Sig Dispense Refill  . Armodafinil 250 MG tablet Take 1 tablet (250 mg total) by mouth daily. 90 tablet 1  . Carboxymethylcellulose Sodium (EYE DROPS OP) Apply 2 drops to eye daily as needed (dry eye).    . cholecalciferol (VITAMIN D3) 25 MCG (1000 UT) tablet Take 1,000 Units by mouth daily.    . colestipol (COLESTID) 1 g tablet Take 0.5-1 g by mouth daily. Pt taking 1/2 to 1 tablet daily depending on meal choice; taken 1 hour after morning meds , and can not eat for another hour    . dexamethasone (DECADRON) 4 MG tablet Take 2 tabs at the night before and 2 tabs the morning of chemotherapy, every 3 weeks, by mouth x 6 cycles, please dispense 24 pills 24 tablet 6  . EPINEPHrine (EPIPEN 2-PAK) 0.3 mg/0.3 mL DEVI Inject 0.3 mg into the muscle once as needed (For bee stings.). Reported on 01/20/2016    . eszopiclone (LUNESTA) 2 MG TABS tablet TAKE ONE-HALF TO ONE TABLET BY MOUTH EVERY NIGHT AT BEDTIME AS NEEDED (Patient taking differently: Take 2 mg by mouth See admin instructions. TAKE  ONE TABLET BY  MOUTH EVERY NIGHT AT BEDTIME) 90 tablet 0  . FLUoxetine (PROZAC) 10 MG capsule Take 10 mg by mouth every morning. Takes with a 48m capsule for her total dose of 365m    . Marland KitchenLUoxetine (PROZAC) 20 MG capsule Take 20 mg by mouth every morning. Takes with a 1014mapsule for her total dose of 49m33m  . ibMarland Kitchenprofen (ADVIL,MOTRIN) 200 MG tablet Take 400-600 mg by mouth every 6 (six) hours as needed for mild pain or moderate pain.    . loMarland Kitchenartan-hydrochlorothiazide (HYZAAR) 100-12.5 MG tablet Take 1 tablet by mouth daily.     . MEMarland KitchenATONIN PO Take 10 mg by mouth at bedtime.     . metFORMIN (GLUCOPHAGE-XR) 500 MG 24 hr tablet Take 500 mg by mouth daily with  breakfast.   3  . Multiple Vitamin (MULTIVITAMIN WITH MINERALS) TABS Take 1 tablet by mouth every morning.     . naproxen sodium (ALEVE) 220 MG tablet Take 220 mg by mouth daily as needed (pain).     . ondansetron (ZOFRAN) 8 MG tablet Take 1 tablet (8 mg total) by mouth every 8 (eight) hours as needed for refractory nausea / vomiting. Start on day 3 after carboplatin chemo. 30 tablet 1  . prochlorperazine (COMPAZINE) 10 MG tablet Take 1 tablet (10 mg total) by mouth every 6 (six) hours as needed (Nausea or vomiting). 30 tablet 1  . rosuvastatin (CRESTOR) 20 MG tablet Take 20 mg by mouth every morning.      No current facility-administered medications for this visit.   Facility-Administered Medications Ordered in Other Visits  Medication Dose Route Frequency Provider Last Rate Last Admin  . CARBOplatin (PARAPLATIN) 560 mg in sodium chloride 0.9 % 250 mL chemo infusion  560 mg Intravenous Once GorsAlvy Bimler, MD 612 mL/hr at 10/02/19 1500 560 mg at 10/02/19 1500  . heparin lock flush 100 unit/mL  500 Units Intracatheter Once PRN GorsAlvy Bimler, MD      . sodium chloride flush (NS) 0.9 % injection 10 mL  10 mL Intravenous PRN GehrNancy Marus   10 mL at 05/12/17 0823  . sodium chloride flush (NS) 0.9 % injection 10 mL  10 mL Intracatheter PRN GorsAlvy Bimler, MD        PHYSICAL EXAMINATION: ECOG PERFORMANCE STATUS: 1 - Symptomatic but completely ambulatory  Vitals:   10/02/19 0915  BP: 122/82  Pulse: 92  Resp: 18  Temp: 98.3 F (36.8 C)  SpO2: 98%   There were no vitals filed for this visit.  GENERAL:alert, no distress and comfortable SKIN: skin color, texture, turgor are normal, no rashes or significant lesions EYES: normal, Conjunctiva are pink and non-injected, sclera clear OROPHARYNX:no exudate, no erythema and lips, buccal mucosa, and tongue normal  NECK: supple, thyroid normal size, non-tender, without nodularity LYMPH:  no palpable lymphadenopathy in the cervical, axillary or  inguinal LUNGS: clear to auscultation and percussion with normal breathing effort HEART: regular rate & rhythm and no murmurs and no lower extremity edema ABDOMEN:abdomen soft, non-tender and normal bowel sounds Musculoskeletal:no cyanosis of digits and no clubbing  NEURO: alert & oriented x 3 with fluent speech, no focal motor/sensory deficits  LABORATORY DATA:  I have reviewed the data as listed    Component Value Date/Time   NA 143 10/02/2019 0859   NA 141 12/02/2016 1219   K 3.8 10/02/2019 0859   K 3.7 12/02/2016 1219   CL 104 10/02/2019 0859   CL 103 01/18/2013 1329   CO2  26 10/02/2019 0859   CO2 27 12/02/2016 1219   GLUCOSE 158 (H) 10/02/2019 0859   GLUCOSE 98 12/02/2016 1219   GLUCOSE 110 (H) 01/18/2013 1329   BUN 17 10/02/2019 0859   BUN 15.3 12/02/2016 1219   CREATININE 0.71 10/02/2019 0859   CREATININE 0.7 12/02/2016 1219   CALCIUM 9.7 10/02/2019 0859   CALCIUM 10.1 12/02/2016 1219   PROT 7.4 10/02/2019 0859   PROT 7.1 12/02/2016 1219   ALBUMIN 4.1 10/02/2019 0859   ALBUMIN 4.2 12/02/2016 1219   AST 18 10/02/2019 0859   AST 21 12/02/2016 1219   ALT 25 10/02/2019 0859   ALT 27 12/02/2016 1219   ALKPHOS 116 10/02/2019 0859   ALKPHOS 104 12/02/2016 1219   BILITOT 0.2 (L) 10/02/2019 0859   BILITOT 0.37 12/02/2016 1219   GFRNONAA >60 10/02/2019 0859   GFRAA >60 10/02/2019 0859    No results found for: SPEP, UPEP  Lab Results  Component Value Date   WBC 4.3 10/02/2019   NEUTROABS 3.8 10/02/2019   HGB 13.3 10/02/2019   HCT 40.5 10/02/2019   MCV 91.4 10/02/2019   PLT 144 (L) 10/02/2019      Chemistry      Component Value Date/Time   NA 143 10/02/2019 0859   NA 141 12/02/2016 1219   K 3.8 10/02/2019 0859   K 3.7 12/02/2016 1219   CL 104 10/02/2019 0859   CL 103 01/18/2013 1329   CO2 26 10/02/2019 0859   CO2 27 12/02/2016 1219   BUN 17 10/02/2019 0859   BUN 15.3 12/02/2016 1219   CREATININE 0.71 10/02/2019 0859   CREATININE 0.7 12/02/2016 1219    GLU 236 12/13/2012 1558      Component Value Date/Time   CALCIUM 9.7 10/02/2019 0859   CALCIUM 10.1 12/02/2016 1219   ALKPHOS 116 10/02/2019 0859   ALKPHOS 104 12/02/2016 1219   AST 18 10/02/2019 0859   AST 21 12/02/2016 1219   ALT 25 10/02/2019 0859   ALT 27 12/02/2016 1219   BILITOT 0.2 (L) 10/02/2019 0859   BILITOT 0.37 12/02/2016 1219

## 2019-10-03 ENCOUNTER — Telehealth: Payer: Self-pay | Admitting: Hematology and Oncology

## 2019-10-03 NOTE — Telephone Encounter (Signed)
Scheduled appt per 2/22 sch message - pt is aware of appt date and time   

## 2019-10-15 ENCOUNTER — Encounter: Payer: Self-pay | Admitting: Hematology and Oncology

## 2019-10-19 ENCOUNTER — Other Ambulatory Visit: Payer: Medicare HMO

## 2019-10-19 ENCOUNTER — Ambulatory Visit: Payer: Medicare HMO | Admitting: Hematology and Oncology

## 2019-10-19 ENCOUNTER — Ambulatory Visit: Payer: Medicare HMO

## 2019-10-23 ENCOUNTER — Other Ambulatory Visit: Payer: Self-pay

## 2019-10-23 ENCOUNTER — Ambulatory Visit (HOSPITAL_COMMUNITY)
Admission: RE | Admit: 2019-10-23 | Discharge: 2019-10-23 | Disposition: A | Discharge status: Home / Self Care | Payer: Medicare HMO | Source: Ambulatory Visit | Attending: Hematology and Oncology | Admitting: Hematology and Oncology

## 2019-10-23 ENCOUNTER — Encounter (HOSPITAL_COMMUNITY): Payer: Self-pay

## 2019-10-23 DIAGNOSIS — D1771 Benign lipomatous neoplasm of kidney: Secondary | ICD-10-CM | POA: Insufficient documentation

## 2019-10-23 DIAGNOSIS — I700 Atherosclerosis of aorta: Secondary | ICD-10-CM | POA: Diagnosis not present

## 2019-10-23 DIAGNOSIS — K639 Disease of intestine, unspecified: Secondary | ICD-10-CM | POA: Insufficient documentation

## 2019-10-23 DIAGNOSIS — K449 Diaphragmatic hernia without obstruction or gangrene: Secondary | ICD-10-CM | POA: Insufficient documentation

## 2019-10-23 DIAGNOSIS — C5700 Malignant neoplasm of unspecified fallopian tube: Secondary | ICD-10-CM

## 2019-10-23 DIAGNOSIS — D3001 Benign neoplasm of right kidney: Secondary | ICD-10-CM | POA: Diagnosis not present

## 2019-10-23 MED ORDER — HEPARIN SOD (PORK) LOCK FLUSH 100 UNIT/ML IV SOLN
500.0000 [IU] | Freq: Once | INTRAVENOUS | Status: AC
Start: 2019-10-23 — End: 2019-10-23

## 2019-10-23 MED ORDER — IOHEXOL 300 MG/ML  SOLN
100.0000 mL | Freq: Once | INTRAMUSCULAR | Status: AC | PRN
  Administered 2019-10-23: 100 mL via INTRAVENOUS

## 2019-10-23 MED ORDER — HEPARIN SOD (PORK) LOCK FLUSH 100 UNIT/ML IV SOLN
INTRAVENOUS | Status: AC
  Administered 2019-10-23: 500 [IU] via INTRAVENOUS
  Filled 2019-10-23: qty 5

## 2019-10-23 MED ORDER — SODIUM CHLORIDE (PF) 0.9 % IJ SOLN
INTRAMUSCULAR | Status: AC
  Filled 2019-10-23: qty 50

## 2019-10-23 MED ORDER — IOHEXOL 9 MG/ML PO SOLN
ORAL | Status: AC
  Administered 2019-10-23: 500 mL
  Filled 2019-10-23: qty 1000

## 2019-10-23 MED ORDER — IOHEXOL 9 MG/ML PO SOLN: 1000.0000 mL | ORAL | Status: AC

## 2019-10-24 ENCOUNTER — Encounter: Payer: Self-pay | Admitting: Hematology and Oncology

## 2019-10-24 ENCOUNTER — Other Ambulatory Visit: Payer: Self-pay

## 2019-10-24 ENCOUNTER — Ambulatory Visit (HOSPITAL_BASED_OUTPATIENT_CLINIC_OR_DEPARTMENT_OTHER): Payer: Medicare HMO | Admitting: Medical

## 2019-10-24 ENCOUNTER — Inpatient Hospital Stay: Payer: Medicare HMO

## 2019-10-24 ENCOUNTER — Inpatient Hospital Stay: Payer: Medicare HMO | Attending: Gynecologic Oncology

## 2019-10-24 ENCOUNTER — Inpatient Hospital Stay: Payer: Medicare HMO | Admitting: Hematology and Oncology

## 2019-10-24 VITALS — HR 95

## 2019-10-24 DIAGNOSIS — Z7952 Long term (current) use of systemic steroids: Secondary | ICD-10-CM | POA: Diagnosis not present

## 2019-10-24 DIAGNOSIS — C5701 Malignant neoplasm of right fallopian tube: Secondary | ICD-10-CM

## 2019-10-24 DIAGNOSIS — Z791 Long term (current) use of non-steroidal anti-inflammatories (NSAID): Secondary | ICD-10-CM | POA: Diagnosis not present

## 2019-10-24 DIAGNOSIS — E119 Type 2 diabetes mellitus without complications: Secondary | ICD-10-CM | POA: Diagnosis not present

## 2019-10-24 DIAGNOSIS — D696 Thrombocytopenia, unspecified: Secondary | ICD-10-CM | POA: Diagnosis not present

## 2019-10-24 DIAGNOSIS — Z7984 Long term (current) use of oral hypoglycemic drugs: Secondary | ICD-10-CM | POA: Diagnosis not present

## 2019-10-24 DIAGNOSIS — Z9079 Acquired absence of other genital organ(s): Secondary | ICD-10-CM | POA: Diagnosis not present

## 2019-10-24 DIAGNOSIS — Z90722 Acquired absence of ovaries, bilateral: Secondary | ICD-10-CM | POA: Diagnosis not present

## 2019-10-24 DIAGNOSIS — T451X5A Adverse effect of antineoplastic and immunosuppressive drugs, initial encounter: Secondary | ICD-10-CM | POA: Diagnosis not present

## 2019-10-24 DIAGNOSIS — C5700 Malignant neoplasm of unspecified fallopian tube: Secondary | ICD-10-CM

## 2019-10-24 DIAGNOSIS — R609 Edema, unspecified: Secondary | ICD-10-CM | POA: Diagnosis not present

## 2019-10-24 DIAGNOSIS — Z5111 Encounter for antineoplastic chemotherapy: Secondary | ICD-10-CM | POA: Insufficient documentation

## 2019-10-24 DIAGNOSIS — G620 Drug-induced polyneuropathy: Secondary | ICD-10-CM | POA: Insufficient documentation

## 2019-10-24 DIAGNOSIS — Z79899 Other long term (current) drug therapy: Secondary | ICD-10-CM | POA: Insufficient documentation

## 2019-10-24 DIAGNOSIS — Z90710 Acquired absence of both cervix and uterus: Secondary | ICD-10-CM | POA: Insufficient documentation

## 2019-10-24 DIAGNOSIS — Z7189 Other specified counseling: Secondary | ICD-10-CM

## 2019-10-24 DIAGNOSIS — M549 Dorsalgia, unspecified: Secondary | ICD-10-CM

## 2019-10-24 LAB — CMP (CANCER CENTER ONLY)
ALT: 23 U/L (ref 0–44)
AST: 17 U/L (ref 15–41)
Albumin: 4.3 g/dL (ref 3.5–5.0)
Alkaline Phosphatase: 109 U/L (ref 38–126)
Anion gap: 12 (ref 5–15)
BUN: 11 mg/dL (ref 8–23)
CO2: 26 mmol/L (ref 22–32)
Calcium: 10 mg/dL (ref 8.9–10.3)
Chloride: 103 mmol/L (ref 98–111)
Creatinine: 0.71 mg/dL (ref 0.44–1.00)
GFR, Est AFR Am: >60 mL/min (ref >60)
GFR, Estimated: >60 mL/min (ref >60)
Glucose, Bld: 190 mg/dL — ABNORMAL HIGH (ref 70–99)
Potassium: 3.6 mmol/L (ref 3.5–5.1)
Sodium: 141 mmol/L (ref 135–145)
Total Bilirubin: 0.2 mg/dL — ABNORMAL LOW (ref 0.3–1.2)
Total Protein: 7.4 g/dL (ref 6.5–8.1)

## 2019-10-24 LAB — CBC WITH DIFFERENTIAL (CANCER CENTER ONLY)
Abs Immature Granulocytes: 0.08 10*3/uL — ABNORMAL HIGH (ref 0.00–0.07)
Basophils Absolute: 0 10*3/uL (ref 0.0–0.1)
Basophils Relative: 0 %
Eosinophils Absolute: 0 10*3/uL (ref 0.0–0.5)
Eosinophils Relative: 0 %
HCT: 39.2 % (ref 36.0–46.0)
Hemoglobin: 12.9 g/dL (ref 12.0–15.0)
Immature Granulocytes: 2 %
Lymphocytes Relative: 13 %
Lymphs Abs: 0.7 10*3/uL (ref 0.7–4.0)
MCH: 30.1 pg (ref 26.0–34.0)
MCHC: 32.9 g/dL (ref 30.0–36.0)
MCV: 91.4 fL (ref 80.0–100.0)
Monocytes Absolute: 0.1 10*3/uL (ref 0.1–1.0)
Monocytes Relative: 2 %
Neutro Abs: 4.4 10*3/uL (ref 1.7–7.7)
Neutrophils Relative %: 83 %
Platelet Count: 125 10*3/uL — ABNORMAL LOW (ref 150–400)
RBC: 4.29 MIL/uL (ref 3.87–5.11)
RDW: 15.4 % (ref 11.5–15.5)
WBC Count: 5.3 10*3/uL (ref 4.0–10.5)
nRBC: 0 % (ref 0.0–0.2)

## 2019-10-24 MED ORDER — DIPHENHYDRAMINE HCL 50 MG/ML IJ SOLN
50.0000 mg | Freq: Once | INTRAMUSCULAR | Status: AC
  Administered 2019-10-24: 50 mg via INTRAVENOUS

## 2019-10-24 MED ORDER — DEXAMETHASONE 4 MG PO TABS: ORAL_TABLET | ORAL | 6 refills | Status: DC

## 2019-10-24 MED ORDER — PALONOSETRON HCL INJECTION 0.25 MG/5ML
0.2500 mg | Freq: Once | INTRAVENOUS | Status: AC
  Administered 2019-10-24: 0.25 mg via INTRAVENOUS

## 2019-10-24 MED ORDER — DEXAMETHASONE SODIUM PHOSPHATE 10 MG/ML IJ SOLN
INTRAMUSCULAR | Status: AC
  Filled 2019-10-24: qty 1

## 2019-10-24 MED ORDER — SODIUM CHLORIDE 0.9 % IV SOLN
150.0000 mg | Freq: Once | INTRAVENOUS | Status: AC
  Administered 2019-10-24: 150 mg via INTRAVENOUS
  Filled 2019-10-24: qty 150

## 2019-10-24 MED ORDER — PALONOSETRON HCL INJECTION 0.25 MG/5ML
INTRAVENOUS | Status: AC
  Filled 2019-10-24: qty 5

## 2019-10-24 MED ORDER — SODIUM CHLORIDE 0.9 % IV SOLN
175.0000 mg/m2 | Freq: Once | INTRAVENOUS | Status: AC
  Administered 2019-10-24: 336 mg via INTRAVENOUS
  Filled 2019-10-24: qty 56

## 2019-10-24 MED ORDER — ACETAMINOPHEN 325 MG PO TABS
650.0000 mg | ORAL_TABLET | Freq: Once | ORAL | Status: AC
  Administered 2019-10-24: 650 mg via ORAL

## 2019-10-24 MED ORDER — SODIUM CHLORIDE 0.9 % IV SOLN
556.2000 mg | Freq: Once | INTRAVENOUS | Status: AC
  Administered 2019-10-24: 560 mg via INTRAVENOUS
  Filled 2019-10-24: qty 56

## 2019-10-24 MED ORDER — LIDOCAINE-PRILOCAINE 2.5-2.5 % EX CREA: 1.0000 "application " | TOPICAL_CREAM | CUTANEOUS | 1 refills | Status: DC | PRN

## 2019-10-24 MED ORDER — HEPARIN SOD (PORK) LOCK FLUSH 100 UNIT/ML IV SOLN
500.0000 [IU] | Freq: Once | INTRAVENOUS | Status: DC
  Filled 2019-10-24: qty 5

## 2019-10-24 MED ORDER — FAMOTIDINE IN NACL 20-0.9 MG/50ML-% IV SOLN
20.0000 mg | Freq: Once | INTRAVENOUS | Status: AC
Start: 2019-10-24 — End: 2019-10-24
  Administered 2019-10-24: 20 mg via INTRAVENOUS

## 2019-10-24 MED ORDER — ACETAMINOPHEN 325 MG PO TABS
ORAL_TABLET | ORAL | Status: AC
  Filled 2019-10-24: qty 2

## 2019-10-24 MED ORDER — HEPARIN SOD (PORK) LOCK FLUSH 100 UNIT/ML IV SOLN
500.0000 [IU] | Freq: Once | INTRAVENOUS | Status: AC | PRN
  Administered 2019-10-24: 500 [IU]
  Filled 2019-10-24: qty 5

## 2019-10-24 MED ORDER — DIPHENHYDRAMINE HCL 50 MG/ML IJ SOLN
INTRAMUSCULAR | Status: AC
  Filled 2019-10-24: qty 1

## 2019-10-24 MED ORDER — FAMOTIDINE IN NACL 20-0.9 MG/50ML-% IV SOLN
INTRAVENOUS | Status: AC
  Filled 2019-10-24: qty 50

## 2019-10-24 MED ORDER — SODIUM CHLORIDE 0.9% FLUSH
10.0000 mL | INTRAVENOUS | Status: DC | PRN
  Administered 2019-10-24: 10 mL
  Filled 2019-10-24: qty 10

## 2019-10-24 MED ORDER — SODIUM CHLORIDE 0.9 % IV SOLN
Freq: Once | INTRAVENOUS | Status: AC
  Filled 2019-10-24: qty 250

## 2019-10-24 MED ORDER — DEXAMETHASONE SODIUM PHOSPHATE 10 MG/ML IJ SOLN
10.0000 mg | Freq: Once | INTRAMUSCULAR | Status: AC
  Administered 2019-10-24: 10 mg via INTRAVENOUS

## 2019-10-24 MED ORDER — SODIUM CHLORIDE 0.9% FLUSH
10.0000 mL | Freq: Once | INTRAVENOUS | Status: AC
  Administered 2019-10-24: 10 mL
  Filled 2019-10-24: qty 10

## 2019-10-24 MED FILL — LIDOCAINE-PRILOCAINE CREAM: 2.5-2.5 | 30 days supply | Qty: 30 | Fill #0

## 2019-10-24 MED FILL — DEXAMETHASONE 4 MG TABLET: 4 | 42 days supply | Qty: 8 | Fill #0

## 2019-10-24 NOTE — Assessment & Plan Note (Signed)
This is likely due to recent treatment. The patient denies recent history of bleeding such as epistaxis, hematuria or hematochezia. She is asymptomatic from the low platelet count. I will observe for now.  she does not require transfusion now. I will continue the chemotherapy at current dose without dosage adjustment.  If the thrombocytopenia gets progressive worse in the future, I might have to delay her treatment or adjust the chemotherapy dose.   

## 2019-10-24 NOTE — Assessment & Plan Note (Signed)
She denies significant elevated blood sugar while on treatment Observe for now We discussed the importance of dietary modification

## 2019-10-24 NOTE — Assessment & Plan Note (Signed)
she has mild peripheral neuropathy, likely related to side effects of treatment. It is only mild, not bothering the patient. I will observe for now If it gets worse in the future, I will consider modifying the dose of the treatment  

## 2019-10-24 NOTE — Patient Instructions (Signed)
   [facility] Discharge Instructions for Patients Receiving Chemotherapy  Today you received the following chemotherapy agents Taxol and Carboplatin   To help prevent nausea and vomiting after your treatment, we encourage you to take your nausea medication as directed.    If you develop nausea and vomiting that is not controlled by your nausea medication, call the clinic.   BELOW ARE SYMPTOMS THAT SHOULD BE REPORTED IMMEDIATELY:  *FEVER GREATER THAN 100.5 F  *CHILLS WITH OR WITHOUT FEVER  NAUSEA AND VOMITING THAT IS NOT CONTROLLED WITH YOUR NAUSEA MEDICATION  *UNUSUAL SHORTNESS OF BREATH  *UNUSUAL BRUISING OR BLEEDING  TENDERNESS IN MOUTH AND THROAT WITH OR WITHOUT PRESENCE OF ULCERS  *URINARY PROBLEMS  *BOWEL PROBLEMS  UNUSUAL RASH Items with * indicate a potential emergency and should be followed up as soon as possible.  Feel free to call the clinic should you have any questions or concerns. The clinic phone number is [phone].  Please show the CHEMO ALERT CARD at check-in to the Emergency Department and triage nurse.   

## 2019-10-24 NOTE — Progress Notes (Signed)
[place] OFFICE PROGRESS NOTE  Patient Care Team: Cari Caraway, MD as PCP - General (Family Medicine) Cari Caraway, MD as Attending Physician 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 are likely due to side effects of treatment which is currently manageable I reached out to the nursing staff who accessed the port and overall her port appears to be functioning well  Thrombocytopenia ([place]) This is likely due to recent treatment. The patient denies recent history of bleeding such as epistaxis, hematuria or hematochezia. She is asymptomatic from the low platelet count. I will observe for now.  she does not require transfusion now. I will continue the chemotherapy at current dose without dosage adjustment.  If the thrombocytopenia gets progressive worse in the future, I might have to delay her treatment or adjust the chemotherapy dose.    Newly diagnosed diabetes ([place]) She denies significant elevated blood sugar while on treatment Observe for now We discussed the importance of dietary modification  Peripheral neuropathy due to chemotherapy [facility]) she has mild peripheral neuropathy, likely related to side effects of treatment. It is only mild, not bothering the patient. I will observe for now If it gets worse in the future, I will consider modifying the dose of the treatment    No orders of the defined types were placed in this encounter.   All questions were answered. The patient knows to call the clinic with any problems, questions or concerns. The total time spent in the appointment was 30 minutes encounter with patients including review of chart and various tests results, discussions about plan of care and coordination of care  plan   [name], MD 10/24/2019 4:25 PM  INTERVAL HISTORY: Please see below for problem oriented charting. She returns for further follow-up She noticed some fluid retention in her legs She received Covid vaccine recently complicated by headaches She thought that her port may not be working well Denies worsening peripheral neuropathy The patient denies any recent signs or symptoms of bleeding such as spontaneous epistaxis, hematuria or hematochezia. No recent changes in bowel habits such as nausea, constipation or diarrhea In fact, her baseline diarrhea has improved and now her bowel movement is somewhat normal  SUMMARY OF ONCOLOGIC HISTORY: Oncology History Overview Note  Oncologic Summary: 1. 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 Random CA 125 elevation January 2019  Question mesenteric nodules and anterior abdominal wall nodule 3. GeneDx Breast/Ovary Panel negative (including BRCA, MMR's, RAD51 etc) ? [facility] BRACAnalysis  Negative for BRCA1/2 in tumor   Fallopian tube carcinoma ([place])  11/01/2012 Imaging   Ct abdomen 1.  Interval development of large mid abdominal mass highly concerning for right ovarian cancer.  There is mild omental nodularity on the left, and peritoneal disease cannot be completely excluded.  There is no ascites or other evidence of metastatic disease. 2.  Mild associated renal pelvocaliectasis bilaterally without obstruction.  Nonobstructing left renal calculus and a small right renal angiomyolipoma noted incidentally.   11/08/2012 Pathology Results   1. Ovary and fallopian tube, right - OVARIAN ATYPICAL PROLIFERATING MUCINOUS TUMOR (BORDERLINE  TUMOR) (28 CM), SEE COMMENT. - HIGH GRADE SEROUS CARCINOMA, 1.5 CM,  CENTERED IN FALLOPIAN TUBE FIMBRIA. - BENIGN FALLOPIAN TUBE WITH NONSPECIFIC CHRONIC INFLAMMATION. 2. Ovary and fallopian tube, left - BENIGN OVARY; NEGATIVE FOR ATYPIA OR MALIGNANCY. - BENIGN FALLOPIAN TUBE; NEGATIVE FOR ATYPIA OR MALIGNANCY. 3. Omentum, resection for tumor - HIGH GRADE CARCINOMA, SEE COMMENT. 4. Appendix, Other than Incidental - FIBROUS OBLITERATION OF APPENDICEAL TIP. - NEGATIVE FOR MALIGNANCY.   11/08/2012 Surgery   Surgery: Exploratory laparotomy, bilateral salpingo-oophorectomy, appendectomy, infacolic omentectomy, optimal debulking  Surgeons:  Paola A. Alycia Rossetti, MD; Lahoma Crocker, MD   Assistant: Caswell Corwin  Pathology: Bilateral fallopian tubes and ovaries to pathology. Appendix as well as omentum. 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  Operative findings: 25 cm right adnexal mass with smooth surface. Surgically absent uterus. Atrophic-appearing left ovary. Normal appearing appendix. Within the omentum there were centimeter nodules scattered throughout the omentum. The remainder of the surfaces were benign.   12/08/2012 Procedure   Impression:  Placement of a subcutaneous port device.  The catheter tip is in the lower SVC and ready to be used.    12/13/2012 - 03/28/2013 Chemotherapy   s/p 6 cycles of paclitaxel and carboplatin   12/13/2012 - 03/28/2013 Chemotherapy   The patient had 6 cycles of carboplatin and Taxol   03/24/2013 Imaging   US abdomen   04/24/2013 Imaging   CT abdomen Interval resection of the large right pelvic and lower abdominal mass lesion with apparent omentectomy.  No evidence for intraperitoneal free fluid on today's study.  No discernible peritoneal lesions.  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   09/07/2013 Genetic Testing   Patient has genetic testing done for BRCA1/2 panel Results revealed patient has no mutation(s):   07/09/2015  Imaging   CT abdomen 1. 12 mm obstructive calculus at the left ureteropelvic junction with moderate proximal hydronephrosis. 2. 2 small supraumbilical ventral hernias, one containing a short segment of the mid transverse colon and the other containing a short segment of the mid small bowel. There is no associated evidence to suggest bowel incarceration or obstruction at this time. 3. Tiny locule of gas non dependently in the lumen of the urinary bladder. This is presumably iatrogenic related to recent catheterization for urinalysis. Alternatively, this could be seen in the setting of urinary tract infection with gas-forming organisms. Clinical correlation for history of recent catheterization is recommended. 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 5. Status post cholecystectomy. 6. Additional incidental findings, as above.    12/11/2016 Imaging   Ct abdomen 1. No evidence of metastatic ovarian cancer. 2. Recurrent subxiphoid ventral abdominal wall hernia containing transverse colon. No evidence of incarceration or obstruction. 3. Stable incidental findings in the liver and kidneys. No recurrent urinary tract calculus. 4. Progressive lower lumbar spondylosis. 5.  Aortic Atherosclerosis (ICD10-I70.0).    08/30/2017 Imaging   MRI thoracic spine 1. At T5-6 there is a small central disc protrusion contacting the ventral thoracic spinal cord. No central canal or foraminal stenosis. 2. At T9-10 there is a small right paracentral disc protrusion. 3.  No acute osseous injury of the thoracic spine. 4. No aggressive osseous lesion to suggest metastatic disease.   09/24/2017 Tumor Marker   Patient's tumor was tested for the following markers: CA-125 Results of the tumor marker test revealed 21.7   09/30/2017 Imaging   CT abdomen 1. New small  clustered soft tissue nodules in the left lower quadrant in the sigmoid mesentery, largest 1.0 cm, which could represent recurrent  peritoneal tumor implants. No ascites.  2. Midline high ventral abdominal wall hernia containing a portion of the transverse colon is mildly increased in size, and without bowel complication at this time. 3. Chronic findings include: Aortic Atherosclerosis (ICD10-I70.0). Diffuse hepatic steatosis. 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   10/11/2017 PET scan   1. Nodules in the sigmoid mesentery are hypermetabolic and highly worrisome for metastatic disease. 2. [place] steatosis.    11/03/2017 Procedure   Successful CT-guided rectus abdominal muscle mass core biopsy. Path: - FOREIGN BODY GIANT CELL REACTION INVOLVING FIBROADIPOSE TISSUE AND SKELETAL MUSCLE. - NO EVIDENCE OF MALIGNANCY.   11/04/2017 Cancer Staging   Staging form: Fallopian Tube, AJCC 7th Edition - Clinical: FIGO Stage IIIC, calculated as Stage IV (rT3, N0, M1) - Signed by [name], MD on 08/09/2019   02/2018 Imaging   CT: 1. Continued increase in size small peritoneal nodules along the mesenteric border of the proximal sigmoid colon as well as along the serosal surface of the proximal sigmoid colon. 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 2. No evidence of distant disease. 3. Stable large ventral hernia.   05/2018 Imaging   PET: 1. Redemonstration of hypermetabolic nodules within the sigmoid mesocolon. Mild response to therapy relative to CT of 02/24/2018. Mixed response to therapy compared to the most recent PET of 10/11/2017. 2. Hypermetabolism within the right pelvic rectus musculature, increased since the prior PET.  Clinical service requested comparison to the 11/24/2017 CT. Index 10 mm nodule within the sigmoid mesocolon was similar to the 02/24/2018 CT, and as described on that exam, increased from 7 mm on 11/24/2017. More inferior nodule within the mesocolon measures 12 mm today on image 156/4 and 8 mm on 11/24/2017.    05/2018 Imaging   CT: IMPRESSION: 1. Since 02/24/2018, decreased size of peritoneal nodules centered in the sigmoid mesocolon. 2. No evidence of new or progressive disease. 3. Hepatic steatosis. 4. Subcentimeter right renal angiomyolipoma, similar. 5.  Aortic Atherosclerosis (ICD10-I70.0). 6. Ventral abdominal wall laxity containing transverse colon, similar.   08/2018 Imaging   CT: IMPRESSION: 1. Nodules within the sigmoid mesocolon have decreased in size compared to prior. No new peritoneal or omental nodularity. 2. No evidence local recurrence the pelvis. 3. Ventral hernia contains a segment of transverse colon. No change from prior.     06/19/2019 Imaging   1. No evidence of metastatic disease in the abdomen pelvis. 2. No peritoneal or omental metastasis identified.  No free fluid. 3. Postcholecystectomy and hysterectomy. Comparison exams are made available. Comparison CT 09/08/2018 and 05/27/2018. PET-CT 05/27/2018    There is a nodule within the proximal aspect of the sigmoid colon measuring 2.2 by 2.2 cm. In comparison to prior CTs and PET-CT there was a hypermetabolic nodule at this location on the PET-CT of 08/27/2017 and on the CT of 09/08/2018 there was a small residual nodule. At that time (09/08/2018) the nodule measured 1.4 by 1.3 cm. Therefore this nodule has increased in the interval and concerning for recurrence of a serosal implant. The previous described lymph nodes in the sigmoid mesocolon and more central mesentery are not increased in size and not pathologic by size criteria.    Concern for recurrence of serosal metastasis in the proximal sigmoid colon with a 2 cm enlarging lesion. Lesion extends into the lumen. No  evidence of high-grade obstruction. Consider FDG PET scan and/or potential colonoscopy for evaluation.   06/29/2019 PET scan   1. Enlarging serosal implant within the proximal sigmoid colon has intense metabolic activity most consistent with malignancy.  Lesion exhibits a somewhat indolent progression as present on PET-CT scan from 10/11/2017 and 05/27/2018. 2. Focal hypermetabolic activity within the RIGHT rectus muscle just off midline is slightly decreased from comparison exam. This may be benign inflammation related to prior laparotomy, however malignancy not excluded.   07/26/2019 Procedure   She had colonoscopy which showed multiple polyps.  6 polyps were removed from the cecum, measuring 3 to 6 mm in size.  One 12 mm polyp was removed from ascending colon and another 1 measures 7 mm.  One 5 mm polyp was removed from the transverse colon.  There is tumor noted in the sigmoid colon, approximately 35 cm from the anus which was biopsied.  The tumor appeared to be fungating, infiltrative and ulcerated but nonobstructive.  It encompassed approximately one third of the circumference of the lumen.   07/26/2019 Pathology Results   Multiple polyps came back tubular adenomas.  2 ascending polyps came back sessile serrated adenoma months.  Sigmoid colon biopsy confirmed metastatic high-grade serous carcinoma.  The morphology and immunophenotype are consistent with metastatic high-grade serous carcinoma from tubal/ovarian primary site.   08/17/2019 -  Chemotherapy   The patient had carboplatin and taxol   10/23/2019 Imaging   1. Sigmoid lesion with diminished size/conspicuity difficult to measure on the previous exam. Adjacent lymph nodes and nodularity less than a cm, largest on coronal image 48 measuring 5 mm. 2. Right rectus muscle with some thickening with mildly convex margin seen posteriorly on image 72 of series 2. This could be due to postsurgical change, however, metastatic disease is not excluded. Attention on follow-up is suggested. 3. No evidence for new metastatic disease. 4. Small hiatal hernia. 5. 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   Aortic Atherosclerosis (ICD10-I70.0).     REVIEW OF SYSTEMS:   Constitutional: Denies fevers,  chills or abnormal weight loss Eyes: Denies blurriness of vision Ears, nose, mouth, throat, and face: Denies mucositis or sore throat Respiratory: Denies cough, dyspnea or wheezes Cardiovascular: Denies palpitation, chest discomfort Gastrointestinal:  Denies nausea, heartburn or change in bowel habits Skin: Denies abnormal skin rashes Lymphatics: Denies new lymphadenopathy or easy bruising Behavioral/Psych: Mood is stable, no new changes  All other systems were reviewed with the patient and are negative.  I have reviewed the past medical history, past surgical history, social history and family history with the patient and they are unchanged from previous note.  ALLERGIES:  is allergic to [employer] tartrate]; bee venom; and cortisone.  MEDICATIONS:  Current Outpatient Medications  Medication Sig Dispense Refill  . Armodafinil 250 MG tablet Take 1 tablet (250 mg total) by mouth daily. 90 tablet 1  . Carboxymethylcellulose Sodium (EYE DROPS OP) Apply 2 drops to eye daily as needed (dry eye).    . cholecalciferol (VITAMIN D3) 25 MCG (1000 UT) tablet Take 1,000 Units by mouth daily.    . colestipol (COLESTID) 1 g tablet Take 0.5-1 g by mouth daily. Pt taking 1/2 to 1 tablet daily depending on meal choice; taken 1 hour after morning meds , and can not eat for another hour    . dexamethasone (DECADRON) 4 MG tablet Take 2 tabs at the night before and 2 tabs the morning of chemotherapy, every 3 weeks, by mouth x 6 cycles,  please dispense 8 pills 8 tablet 6  . EPINEPHrine (EPIPEN 2-PAK) 0.3 mg/0.3 mL DEVI Inject 0.3 mg into the muscle once as needed (For bee stings.). Reported on 01/20/2016    . eszopiclone (LUNESTA) 2 MG TABS tablet TAKE ONE-HALF TO ONE TABLET BY MOUTH EVERY NIGHT AT BEDTIME AS NEEDED (Patient taking differently: Take 2 mg by mouth See admin instructions. TAKE  ONE TABLET BY MOUTH EVERY NIGHT AT BEDTIME) 90 tablet 0  . FLUoxetine (PROZAC) 10 MG capsule Take 10 mg by mouth  every morning. Takes with a 51m capsule for her total dose of 3102m    . Marland KitchenLUoxetine (PROZAC) 20 MG capsule Take 20 mg by mouth every morning. Takes with a 1046mapsule for her total dose of 41m87m  . ibMarland Kitchenprofen (ADVIL,MOTRIN) 200 MG tablet Take 400-600 mg by mouth every 6 (six) hours as needed for mild pain or moderate pain.    . liMarland Kitchenocaine-prilocaine (EMLA) cream Apply 1 application topically as needed. 30 g 1  . losartan-hydrochlorothiazide (HYZAAR) 100-12.5 MG tablet Take 1 tablet by mouth daily.     . MEMarland KitchenATONIN PO Take 10 mg by mouth at bedtime.     . metFORMIN (GLUCOPHAGE-XR) 500 MG 24 hr tablet Take 500 mg by mouth daily with breakfast.   3  . Multiple Vitamin (MULTIVITAMIN WITH MINERALS) TABS Take 1 tablet by mouth every morning.     . naproxen sodium (ALEVE) 220 MG tablet Take 220 mg by mouth daily as needed (pain).     . ondansetron (ZOFRAN) 8 MG tablet Take 1 tablet (8 mg total) by mouth every 8 (eight) hours as needed for refractory nausea / vomiting. Start on day 3 after carboplatin chemo. 30 tablet 1  . prochlorperazine (COMPAZINE) 10 MG tablet Take 1 tablet (10 mg total) by mouth every 6 (six) hours as needed (Nausea or vomiting). 30 tablet 1  . rosuvastatin (CRESTOR) 20 MG tablet Take 20 mg by mouth every morning.      No current facility-administered medications for this visit.   Facility-Administered Medications Ordered in Other Visits  Medication Dose Route Frequency Provider Last Rate Last Admin  . CARBOplatin (PARAPLATIN) 560 mg in sodium chloride 0.9 % 250 mL chemo infusion  560 mg Intravenous Once GorsAlvy Bimler, MD      . heparin lock flush 100 unit/mL  500 Units Intracatheter Once PRN GorsAlvy Bimler, MD      . PACLitaxel (TAXOL) 336 mg in sodium chloride 0.9 % 500 mL chemo infusion (> 80mg60m  175 mg/m2 (Treatment Plan Recorded) Intravenous Once GorsuHeath Lark204 mL/hr at 10/24/19 1356 Rate Verify at 10/24/19 1356  . sodium chloride flush (NS) 0.9 % injection 10 mL  10 mL  Intravenous PRN GehriNancy Marus  10 mL at 05/12/17 0823  . sodium chloride flush (NS) 0.9 % injection 10 mL  10 mL Intracatheter PRN GorsuAlvy Bimler MD        PHYSICAL EXAMINATION: ECOG PERFORMANCE STATUS: 1 - Symptomatic but completely ambulatory  Vitals:   10/24/19 1146  BP: (!) 158/88  Pulse: (!) 108  Resp: 18  Temp: 98.2 F (36.8 C)  SpO2: 99%   Filed Weights   10/24/19 1146  Weight: 185 lb (83.9 kg)    GENERAL:alert, no distress and comfortable HEART: Noted mild lower extremity edema NEURO: alert & oriented x 3 with fluent speech, no focal motor/sensory deficits  LABORATORY DATA:  I have reviewed the data as listed    Component Value  Date/Time   NA 141 10/24/2019 1042   NA 141 12/02/2016 1219   K 3.6 10/24/2019 1042   K 3.7 12/02/2016 1219   CL 103 10/24/2019 1042   CL 103 01/18/2013 1329   CO2 26 10/24/2019 1042   CO2 27 12/02/2016 1219   GLUCOSE 190 (H) 10/24/2019 1042   GLUCOSE 98 12/02/2016 1219   GLUCOSE 110 (H) 01/18/2013 1329   BUN 11 10/24/2019 1042   BUN 15.3 12/02/2016 1219   CREATININE 0.71 10/24/2019 1042   CREATININE 0.7 12/02/2016 1219   CALCIUM 10.0 10/24/2019 1042   CALCIUM 10.1 12/02/2016 1219   PROT 7.4 10/24/2019 1042   PROT 7.1 12/02/2016 1219   ALBUMIN 4.3 10/24/2019 1042   ALBUMIN 4.2 12/02/2016 1219   AST 17 10/24/2019 1042   AST 21 12/02/2016 1219   ALT 23 10/24/2019 1042   ALT 27 12/02/2016 1219   ALKPHOS 109 10/24/2019 1042   ALKPHOS 104 12/02/2016 1219   BILITOT 0.2 (L) 10/24/2019 1042   BILITOT 0.37 12/02/2016 1219   GFRNONAA >60 10/24/2019 1042   GFRAA >60 10/24/2019 1042    No results found for: SPEP, UPEP  Lab Results  Component Value Date   WBC 5.3 10/24/2019   NEUTROABS 4.4 10/24/2019   HGB 12.9 10/24/2019   HCT 39.2 10/24/2019   MCV 91.4 10/24/2019   PLT 125 (L) 10/24/2019      Chemistry      Component Value Date/Time   NA 141 10/24/2019 1042   NA 141 12/02/2016 1219   K 3.6 10/24/2019 1042   K 3.7  12/02/2016 1219   CL 103 10/24/2019 1042   CL 103 01/18/2013 1329   CO2 26 10/24/2019 1042   CO2 27 12/02/2016 1219   BUN 11 10/24/2019 1042   BUN 15.3 12/02/2016 1219   CREATININE 0.71 10/24/2019 1042   CREATININE 0.7 12/02/2016 1219   GLU 236 12/13/2012 1558      Component Value Date/Time   CALCIUM 10.0 10/24/2019 1042   CALCIUM 10.1 12/02/2016 1219   ALKPHOS 109 10/24/2019 1042   ALKPHOS 104 12/02/2016 1219   AST 17 10/24/2019 1042   AST 21 12/02/2016 1219   ALT 23 10/24/2019 1042   ALT 27 12/02/2016 1219   BILITOT 0.2 (L) 10/24/2019 1042   BILITOT 0.37 12/02/2016 1219       RADIOGRAPHIC STUDIES: I have reviewed multiple CT imaging and PET scan with the patient I have personally reviewed the radiological images as listed and agreed with the findings in the report. CT ABDOMEN PELVIS W CONTRAST  Result Date: 10/23/2019 CLINICAL DATA:  History of ovarian cancer. History of diagnosis in 2014 currently on chemotherapy with postoperative changes of hysterectomy, appendectomy and cholecystectomy. EXAM: CT ABDOMEN AND PELVIS WITH CONTRAST TECHNIQUE: Multidetector CT imaging of the abdomen and pelvis was performed using the standard protocol following bolus administration of intravenous contrast. CONTRAST:  147m OMNIPAQUE IOHEXOL 300 MG/ML  SOLN COMPARISON:  CT of 06/20/2019 and PET scan of 06/29/2019 FINDINGS: Lower chest: Basilar scarring. Minimal atelectasis. No signs of effusion or consolidation. Hepatobiliary: Liver without focal lesion. Portal vein is patent. Post cholecystectomy with mild biliary ductal distension not changed and likely post cholecystectomy baseline. Calcified lesion over the right hemi liver (image 10, series 2) 10 mm. Pancreas: Pancreas is normal without signs of ductal dilation or inflammation. No pancreatic lesion. Spleen: Spleen is normal size without focal lesion. Adrenals/Urinary Tract: Normal adrenal glands. 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 No  hydronephrosis. Urinary bladder is normal. Stomach/Bowel: Small hiatal hernia. No signs of acute bowel process. Sigmoid lesion with diminished size/conspicuity difficult to measure on the previous exam appear smaller. Not seen on the current exam. Area assessed on the previous exam measuring as much as 2 cm. Adjacent lymph nodes and or nodularity less than a cm, largest on coronal image 48 measuring 5 mm. Vascular/Lymphatic: Calcified atherosclerotic changes without aneurysm in the abdominal aorta. No adenopathy. Reproductive: Post hysterectomy. No pelvic lymphadenopathy. Other: Left inguinal hernia contains fat. Midline postoperative changes are similar to the previous exam. Right rectus muscle with some thickening with mildly convex margin seen extending posteriorly on image 72 of series 2 Musculoskeletal: No acute bone finding or destructive bone process. IMPRESSION: 1. Sigmoid lesion with diminished size/conspicuity difficult to measure on the previous exam. Adjacent lymph nodes and nodularity less than a cm, largest on coronal image 48 measuring 5 mm. 2. Right rectus muscle with some thickening with mildly convex margin seen posteriorly on image 72 of series 2. This could be due to postsurgical change, however, metastatic disease is not excluded. Attention on follow-up is suggested. 3. No evidence for new metastatic disease. 4. Small hiatal hernia. 5. 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 Aortic Atherosclerosis (ICD10-I70.0). Electronically Signed   By: Zetta Bills M.D.   On: 10/23/2019 10:22

## 2019-10-24 NOTE — Progress Notes (Signed)
1640- Patient c/o lower back pain rated 6/10. [name], PA made aware and assessed patient. Verbal order received for Tylenol 650 mg po.  1706- Patient rates pain 4/10

## 2019-10-24 NOTE — Assessment & Plan Note (Signed)
I have reviewed multiple imaging studies with the patient She have good response to therapy The plan will be to complete 6 cycles of treatment All her other nonspecific symptoms such as fluid retention, etc. are likely due to side effects of treatment which is currently manageable I reached out to the nursing staff who accessed the port and overall her port appears to be functioning well

## 2019-10-25 ENCOUNTER — Telehealth: Payer: Self-pay | Admitting: Hematology and Oncology

## 2019-10-25 NOTE — Telephone Encounter (Signed)
Scheduled per 3/16 sch msg. Called and spoke with pt, confirmed 4/13 and 5/4 appts

## 2019-10-25 NOTE — Progress Notes (Signed)
Kristin Webb was seen in the infusion room today as she was receiving Taxol.  She got approximately 10 minutes of her infusion in the left when she reported mild bilateral back pain.  She thought that it might be secondary to her sitting in the infusion chair for the afternoon.  She was agreeable to receiving a Tylenol 650 mg p.o. x1.  She completed the remainder of her infusion without any issues of concern.  [name], MHS, PA-C Physician Assistant

## 2019-10-30 DIAGNOSIS — E782 Mixed hyperlipidemia: Secondary | ICD-10-CM | POA: Diagnosis not present

## 2019-10-30 DIAGNOSIS — E1159 Type 2 diabetes mellitus with other circulatory complications: Secondary | ICD-10-CM | POA: Diagnosis not present

## 2019-10-30 DIAGNOSIS — I10 Essential (primary) hypertension: Secondary | ICD-10-CM | POA: Diagnosis not present

## 2019-10-30 DIAGNOSIS — M159 Polyosteoarthritis, unspecified: Secondary | ICD-10-CM | POA: Diagnosis not present

## 2019-10-31 DIAGNOSIS — G4733 Obstructive sleep apnea (adult) (pediatric): Secondary | ICD-10-CM | POA: Diagnosis not present

## 2019-11-01 ENCOUNTER — Encounter: Payer: Self-pay | Admitting: Hematology and Oncology

## 2019-11-15 NOTE — Progress Notes (Signed)
Pharmacist Chemotherapy Monitoring - Follow Up Assessment    I verify that I have reviewed each item in the below checklist:  . Regimen for the patient is scheduled for the appropriate day and plan matches scheduled date. Marland Kitchen Appropriate non-routine labs are ordered dependent on drug ordered. . If applicable, additional medications reviewed and ordered per protocol based on lifetime cumulative doses and/or treatment regimen.   Plan for follow-up and/or issues identified: No . I-vent associated with next due treatment: No . MD and/or nursing notified: No  Pheng Prokop D 11/15/2019 3:55 PM

## 2019-11-20 ENCOUNTER — Other Ambulatory Visit: Payer: Self-pay | Admitting: Hematology and Oncology

## 2019-11-20 ENCOUNTER — Encounter: Payer: Self-pay | Admitting: Hematology and Oncology

## 2019-11-20 DIAGNOSIS — E1159 Type 2 diabetes mellitus with other circulatory complications: Secondary | ICD-10-CM | POA: Diagnosis not present

## 2019-11-20 DIAGNOSIS — M159 Polyosteoarthritis, unspecified: Secondary | ICD-10-CM | POA: Diagnosis not present

## 2019-11-20 DIAGNOSIS — I10 Essential (primary) hypertension: Secondary | ICD-10-CM | POA: Diagnosis not present

## 2019-11-20 DIAGNOSIS — R300 Dysuria: Secondary | ICD-10-CM | POA: Insufficient documentation

## 2019-11-20 DIAGNOSIS — E782 Mixed hyperlipidemia: Secondary | ICD-10-CM | POA: Diagnosis not present

## 2019-11-21 ENCOUNTER — Inpatient Hospital Stay: Payer: Medicare HMO

## 2019-11-21 ENCOUNTER — Inpatient Hospital Stay: Payer: Medicare HMO | Attending: Gynecologic Oncology

## 2019-11-21 ENCOUNTER — Inpatient Hospital Stay: Payer: Medicare HMO | Admitting: Hematology and Oncology

## 2019-11-21 ENCOUNTER — Encounter: Payer: Self-pay | Admitting: Hematology and Oncology

## 2019-11-21 ENCOUNTER — Other Ambulatory Visit: Payer: Self-pay

## 2019-11-21 VITALS — HR 100

## 2019-11-21 DIAGNOSIS — K760 Fatty (change of) liver, not elsewhere classified: Secondary | ICD-10-CM | POA: Diagnosis not present

## 2019-11-21 DIAGNOSIS — E119 Type 2 diabetes mellitus without complications: Secondary | ICD-10-CM | POA: Insufficient documentation

## 2019-11-21 DIAGNOSIS — C5700 Malignant neoplasm of unspecified fallopian tube: Secondary | ICD-10-CM

## 2019-11-21 DIAGNOSIS — R300 Dysuria: Secondary | ICD-10-CM | POA: Diagnosis not present

## 2019-11-21 DIAGNOSIS — Z9049 Acquired absence of other specified parts of digestive tract: Secondary | ICD-10-CM | POA: Insufficient documentation

## 2019-11-21 DIAGNOSIS — I700 Atherosclerosis of aorta: Secondary | ICD-10-CM | POA: Insufficient documentation

## 2019-11-21 DIAGNOSIS — C5701 Malignant neoplasm of right fallopian tube: Secondary | ICD-10-CM

## 2019-11-21 DIAGNOSIS — D120 Benign neoplasm of cecum: Secondary | ICD-10-CM | POA: Insufficient documentation

## 2019-11-21 DIAGNOSIS — Z7189 Other specified counseling: Secondary | ICD-10-CM

## 2019-11-21 DIAGNOSIS — K449 Diaphragmatic hernia without obstruction or gangrene: Secondary | ICD-10-CM | POA: Insufficient documentation

## 2019-11-21 DIAGNOSIS — K4090 Unilateral inguinal hernia, without obstruction or gangrene, not specified as recurrent: Secondary | ICD-10-CM | POA: Insufficient documentation

## 2019-11-21 DIAGNOSIS — D123 Benign neoplasm of transverse colon: Secondary | ICD-10-CM | POA: Diagnosis not present

## 2019-11-21 DIAGNOSIS — Z888 Allergy status to other drugs, medicaments and biological substances status: Secondary | ICD-10-CM | POA: Insufficient documentation

## 2019-11-21 DIAGNOSIS — D1771 Benign lipomatous neoplasm of kidney: Secondary | ICD-10-CM | POA: Insufficient documentation

## 2019-11-21 DIAGNOSIS — Z7952 Long term (current) use of systemic steroids: Secondary | ICD-10-CM | POA: Diagnosis not present

## 2019-11-21 DIAGNOSIS — Z79899 Other long term (current) drug therapy: Secondary | ICD-10-CM | POA: Diagnosis not present

## 2019-11-21 DIAGNOSIS — Z90710 Acquired absence of both cervix and uterus: Secondary | ICD-10-CM | POA: Insufficient documentation

## 2019-11-21 DIAGNOSIS — Z5111 Encounter for antineoplastic chemotherapy: Secondary | ICD-10-CM | POA: Diagnosis present

## 2019-11-21 DIAGNOSIS — D122 Benign neoplasm of ascending colon: Secondary | ICD-10-CM | POA: Insufficient documentation

## 2019-11-21 LAB — CBC WITH DIFFERENTIAL (CANCER CENTER ONLY)
Abs Immature Granulocytes: 0.01 10*3/uL (ref 0.00–0.07)
Basophils Absolute: 0 10*3/uL (ref 0.0–0.1)
Basophils Relative: 0 %
Eosinophils Absolute: 0 10*3/uL (ref 0.0–0.5)
Eosinophils Relative: 0 %
HCT: 38.7 % (ref 36.0–46.0)
Hemoglobin: 13 g/dL (ref 12.0–15.0)
Immature Granulocytes: 0 %
Lymphocytes Relative: 13 %
Lymphs Abs: 0.6 10*3/uL — ABNORMAL LOW (ref 0.7–4.0)
MCH: 31.8 pg (ref 26.0–34.0)
MCHC: 33.6 g/dL (ref 30.0–36.0)
MCV: 94.6 fL (ref 80.0–100.0)
Monocytes Absolute: 0.1 10*3/uL (ref 0.1–1.0)
Monocytes Relative: 2 %
Neutro Abs: 3.9 10*3/uL (ref 1.7–7.7)
Neutrophils Relative %: 85 %
Platelet Count: 176 10*3/uL (ref 150–400)
RBC: 4.09 MIL/uL (ref 3.87–5.11)
RDW: 15.1 % (ref 11.5–15.5)
WBC Count: 4.6 10*3/uL (ref 4.0–10.5)
nRBC: 0 % (ref 0.0–0.2)

## 2019-11-21 LAB — CMP (CANCER CENTER ONLY)
ALT: 28 U/L (ref 0–44)
AST: 20 U/L (ref 15–41)
Albumin: 4.3 g/dL (ref 3.5–5.0)
Alkaline Phosphatase: 114 U/L (ref 38–126)
Anion gap: 13 (ref 5–15)
BUN: 14 mg/dL (ref 8–23)
CO2: 25 mmol/L (ref 22–32)
Calcium: 10.5 mg/dL — ABNORMAL HIGH (ref 8.9–10.3)
Chloride: 102 mmol/L (ref 98–111)
Creatinine: 0.72 mg/dL (ref 0.44–1.00)
GFR, Est AFR Am: >60 mL/min (ref >60)
GFR, Estimated: >60 mL/min (ref >60)
Glucose, Bld: 129 mg/dL — ABNORMAL HIGH (ref 70–99)
Potassium: 3.9 mmol/L (ref 3.5–5.1)
Sodium: 140 mmol/L (ref 135–145)
Total Bilirubin: 0.3 mg/dL (ref 0.3–1.2)
Total Protein: 7.8 g/dL (ref 6.5–8.1)

## 2019-11-21 LAB — URINALYSIS, COMPLETE (UACMP) WITH MICROSCOPIC
Bilirubin Urine: NEGATIVE
Glucose, UA: NEGATIVE mg/dL
Hgb urine dipstick: NEGATIVE
Ketones, ur: NEGATIVE mg/dL
Nitrite: NEGATIVE
Protein, ur: NEGATIVE mg/dL
Specific Gravity, Urine: 1.017 (ref 1.005–1.030)
WBC, UA: >50 WBC/hpf — ABNORMAL HIGH (ref 0–5)
pH: 7 (ref 5.0–8.0)

## 2019-11-21 MED ORDER — PALONOSETRON HCL INJECTION 0.25 MG/5ML
0.2500 mg | Freq: Once | INTRAVENOUS | Status: AC
  Administered 2019-11-21: 0.25 mg via INTRAVENOUS

## 2019-11-21 MED ORDER — PALONOSETRON HCL INJECTION 0.25 MG/5ML
INTRAVENOUS | Status: AC
  Filled 2019-11-21: qty 5

## 2019-11-21 MED ORDER — SODIUM CHLORIDE 0.9 % IV SOLN
556.2000 mg | Freq: Once | INTRAVENOUS | Status: AC
  Administered 2019-11-21: 560 mg via INTRAVENOUS
  Filled 2019-11-21: qty 56

## 2019-11-21 MED ORDER — FAMOTIDINE IN NACL 20-0.9 MG/50ML-% IV SOLN
INTRAVENOUS | Status: AC
  Filled 2019-11-21: qty 50

## 2019-11-21 MED ORDER — SODIUM CHLORIDE 0.9 % IV SOLN
175.0000 mg/m2 | Freq: Once | INTRAVENOUS | Status: AC
  Administered 2019-11-21: 336 mg via INTRAVENOUS
  Filled 2019-11-21: qty 56

## 2019-11-21 MED ORDER — FAMOTIDINE IN NACL 20-0.9 MG/50ML-% IV SOLN
20.0000 mg | Freq: Once | INTRAVENOUS | Status: AC
  Administered 2019-11-21: 20 mg via INTRAVENOUS

## 2019-11-21 MED ORDER — SODIUM CHLORIDE 0.9% FLUSH
10.0000 mL | INTRAVENOUS | Status: DC | PRN
  Administered 2019-11-21: 10 mL
  Filled 2019-11-21: qty 10

## 2019-11-21 MED ORDER — HEPARIN SOD (PORK) LOCK FLUSH 100 UNIT/ML IV SOLN
500.0000 [IU] | Freq: Once | INTRAVENOUS | Status: AC | PRN
  Administered 2019-11-21: 500 [IU]
  Filled 2019-11-21: qty 5

## 2019-11-21 MED ORDER — SODIUM CHLORIDE 0.9 % IV SOLN
150.0000 mg | Freq: Once | INTRAVENOUS | Status: AC
  Administered 2019-11-21: 150 mg via INTRAVENOUS
  Filled 2019-11-21: qty 150

## 2019-11-21 MED ORDER — DIPHENHYDRAMINE HCL 50 MG/ML IJ SOLN
INTRAMUSCULAR | Status: AC
  Filled 2019-11-21: qty 1

## 2019-11-21 MED ORDER — SODIUM CHLORIDE 0.9 % IV SOLN
Freq: Once | INTRAVENOUS | Status: AC
  Filled 2019-11-21: qty 250

## 2019-11-21 MED ORDER — DIPHENHYDRAMINE HCL 50 MG/ML IJ SOLN
50.0000 mg | Freq: Once | INTRAMUSCULAR | Status: AC
  Administered 2019-11-21: 50 mg via INTRAVENOUS

## 2019-11-21 MED ORDER — SODIUM CHLORIDE 0.9 % IV SOLN
10.0000 mg | Freq: Once | INTRAVENOUS | Status: AC
  Administered 2019-11-21: 10 mg via INTRAVENOUS
  Filled 2019-11-21: qty 10

## 2019-11-21 MED ORDER — SODIUM CHLORIDE 0.9% FLUSH
10.0000 mL | Freq: Once | INTRAVENOUS | Status: AC
  Administered 2019-11-21: 10 mL
  Filled 2019-11-21: qty 10

## 2019-11-21 NOTE — Patient Instructions (Signed)
[facility] Discharge Instructions for Patients Receiving Chemotherapy  Today you received the following chemotherapy agents: Paclitaxel, Carboplatin  To help prevent nausea and vomiting after your treatment, we encourage you to take your nausea medication as directed.   If you develop nausea and vomiting that is not controlled by your nausea medication, call the clinic.   BELOW ARE SYMPTOMS THAT SHOULD BE REPORTED IMMEDIATELY:  *FEVER GREATER THAN 100.5 F  *CHILLS WITH OR WITHOUT FEVER  NAUSEA AND VOMITING THAT IS NOT CONTROLLED WITH YOUR NAUSEA MEDICATION  *UNUSUAL SHORTNESS OF BREATH  *UNUSUAL BRUISING OR BLEEDING  TENDERNESS IN MOUTH AND THROAT WITH OR WITHOUT PRESENCE OF ULCERS  *URINARY PROBLEMS  *BOWEL PROBLEMS  UNUSUAL RASH Items with * indicate a potential emergency and should be followed up as soon as possible.  Feel free to call the clinic should you have any questions or concerns. The clinic phone number is [phone].  Please show the CHEMO ALERT CARD at check-in to the Emergency Department and triage nurse.   

## 2019-11-21 NOTE — Progress Notes (Signed)
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   All questions were answered. The patient knows to call the clinic with any problems, questions or concerns. The total time spent in the appointment was 20 minutes encounter with patients including review of chart and various tests results, discussions about plan of care and coordination of care plan   [name], MD 11/21/2019 11:51 AM  INTERVAL HISTORY: Please see below for problem oriented charting. She returns for chemotherapy and follow-up She has some occasional unsteadiness in the morning when she wakes up She had Covid vaccination recently, complicated by fever that has subsequently resolved She has some occasional urinary incontinence and discomfort when she urinates No worsening peripheral neuropathy No recent nausea or changes in bowel habits while on treatment  SUMMARY OF ONCOLOGIC HISTORY: Oncology History Overview Note  Oncologic Summary: 1. 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 Random CA 125 elevation January 2019  Question mesenteric nodules and  anterior abdominal wall nodule 3. GeneDx Breast/Ovary Panel negative (including BRCA, MMR's, RAD51 etc) ? [facility] BRACAnalysis  Negative for BRCA1/2 in tumor   Fallopian tube carcinoma ([place])  11/01/2012 Imaging   Ct abdomen 1.  Interval development of large mid abdominal mass highly concerning for right ovarian cancer.  There is mild omental nodularity on the left, and peritoneal disease cannot be completely excluded.  There is no ascites or other evidence of metastatic disease. 2.  Mild associated renal pelvocaliectasis bilaterally without obstruction.  Nonobstructing left renal calculus and a small right renal angiomyolipoma noted incidentally.   11/08/2012 Pathology Results   1. 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 - BENIGN FALLOPIAN TUBE WITH NONSPECIFIC CHRONIC INFLAMMATION. 2. Ovary and fallopian tube, left - BENIGN OVARY; NEGATIVE FOR ATYPIA OR MALIGNANCY. - BENIGN FALLOPIAN TUBE; NEGATIVE FOR ATYPIA OR MALIGNANCY. 3. Omentum, resection for tumor - HIGH GRADE CARCINOMA, SEE COMMENT. 4. Appendix, Other than Incidental - FIBROUS OBLITERATION OF APPENDICEAL TIP. - NEGATIVE FOR MALIGNANCY.   11/08/2012 Surgery   Surgery: Exploratory laparotomy, bilateral salpingo-oophorectomy, appendectomy, infacolic omentectomy, optimal debulking  Surgeons:  Paola A. Alycia Rossetti, MD; Lahoma Crocker, MD   Assistant: Caswell Corwin  Pathology: Bilateral fallopian tubes and ovaries to pathology. Appendix as well as omentum. 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  Operative findings: 25 cm right adnexal mass with smooth surface. Surgically absent uterus. Atrophic-appearing left ovary. Normal appearing appendix. Within the omentum there were centimeter nodules  scattered throughout the omentum. The remainder of the surfaces were benign.   12/08/2012  Procedure   Impression:  Placement of a subcutaneous port device.  The catheter tip is in the lower SVC and ready to be used.    12/13/2012 - 03/28/2013 Chemotherapy   s/p 6 cycles of paclitaxel and carboplatin   12/13/2012 - 03/28/2013 Chemotherapy   The patient had 6 cycles of carboplatin and Taxol   03/24/2013 Imaging   US abdomen   04/24/2013 Imaging   CT abdomen Interval resection of the large right pelvic and lower abdominal mass lesion with apparent omentectomy.  No evidence for intraperitoneal free fluid on today's study.  No discernible peritoneal lesions.  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   09/07/2013 Genetic Testing   Patient has genetic testing done for BRCA1/2 panel Results revealed patient has no mutation(s):   07/09/2015 Imaging   CT abdomen 1. 12 mm obstructive calculus at the left ureteropelvic junction with moderate proximal hydronephrosis. 2. 2 small supraumbilical ventral hernias, one containing a short segment of the mid transverse colon and the other containing a short segment of the mid small bowel. There is no associated evidence to suggest bowel incarceration or obstruction at this time. 3. Tiny locule of gas non dependently in the lumen of the urinary bladder. This is presumably iatrogenic related to recent catheterization for urinalysis. Alternatively, this could be seen in the setting of urinary tract infection with gas-forming organisms. Clinical correlation for history of recent catheterization is recommended. 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 5. Status post cholecystectomy. 6. Additional incidental findings, as above.    12/11/2016 Imaging   Ct abdomen 1. No evidence of metastatic ovarian cancer. 2. Recurrent subxiphoid ventral abdominal wall hernia containing transverse colon. No evidence of incarceration or obstruction. 3.  Stable incidental findings in the liver and kidneys. No recurrent urinary tract calculus. 4. Progressive lower lumbar spondylosis. 5.  Aortic Atherosclerosis (ICD10-I70.0).    08/30/2017 Imaging   MRI thoracic spine 1. At T5-6 there is a small central disc protrusion contacting the ventral thoracic spinal cord. No central canal or foraminal stenosis. 2. At T9-10 there is a small right paracentral disc protrusion. 3.  No acute osseous injury of the thoracic spine. 4. No aggressive osseous lesion to suggest metastatic disease.   09/24/2017 Tumor Marker   Patient's tumor was tested for the following markers: CA-125 Results of the tumor marker test revealed 21.7   09/30/2017 Imaging   CT abdomen 1. New small clustered soft tissue nodules in the left lower quadrant in the sigmoid mesentery, largest 1.0 cm, which could represent recurrent peritoneal tumor implants. No ascites.  2. Midline high ventral abdominal wall hernia containing a portion of the transverse colon is mildly increased in size, and without bowel complication at this time. 3. Chronic findings include: Aortic Atherosclerosis (ICD10-I70.0). Diffuse hepatic steatosis. 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   10/11/2017 PET scan   1. Nodules in the sigmoid mesentery are hypermetabolic and highly worrisome for metastatic disease. 2. Attic steatosis.    11/03/2017 Procedure   Successful CT-guided rectus abdominal muscle mass core biopsy. Path: - FOREIGN BODY GIANT CELL REACTION INVOLVING FIBROADIPOSE TISSUE AND SKELETAL MUSCLE. - NO EVIDENCE OF MALIGNANCY.   11/04/2017 Cancer Staging   Staging form: Fallopian Tube, AJCC 7th Edition - Clinical: FIGO Stage IIIC, calculated as Stage IV (rT3, N0, M1) - Signed by [name], MD on 08/09/2019   02/2018 Imaging   CT: 1. Continued increase in size small peritoneal nodules  along the mesenteric border of the proximal sigmoid colon as  well as along the serosal surface of the proximal sigmoid colon. 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 2. No evidence of distant disease. 3. Stable large ventral hernia.   05/2018 Imaging   PET: 1. Redemonstration of hypermetabolic nodules within the sigmoid mesocolon. Mild response to therapy relative to CT of 02/24/2018. Mixed response to therapy compared to the most recent PET of 10/11/2017. 2. Hypermetabolism within the right pelvic rectus musculature, increased since the prior PET.  Clinical service requested comparison to the 11/24/2017 CT. Index 10 mm nodule within the sigmoid mesocolon was similar to the 02/24/2018 CT, and as described on that exam, increased from 7 mm on 11/24/2017. More inferior nodule within the mesocolon measures 12 mm today on image 156/4 and 8 mm on 11/24/2017.   05/2018 Imaging   CT: IMPRESSION: 1. Since 02/24/2018, decreased size of peritoneal nodules centered in the sigmoid mesocolon. 2. No evidence of new or progressive disease. 3. Hepatic steatosis. 4. Subcentimeter right renal angiomyolipoma, similar. 5.  Aortic Atherosclerosis (ICD10-I70.0). 6. Ventral abdominal wall laxity containing transverse colon, similar.   08/2018 Imaging   CT: IMPRESSION: 1. Nodules within the sigmoid mesocolon have decreased in size compared to prior. No new peritoneal or omental nodularity. 2. No evidence local recurrence the pelvis. 3. Ventral hernia contains a segment of transverse colon. No change from prior.     06/19/2019 Imaging   1. No evidence of metastatic disease in the abdomen pelvis. 2. No peritoneal or omental metastasis identified.  No free fluid. 3. Postcholecystectomy and hysterectomy. Comparison exams are made available. Comparison CT 09/08/2018 and 05/27/2018. PET-CT 05/27/2018    There is a nodule within the proximal aspect of the sigmoid colon measuring 2.2 by 2.2 cm. In comparison to prior CTs and PET-CT there  was a hypermetabolic nodule at this location on the PET-CT of 08/27/2017 and on the CT of 09/08/2018 there was a small residual nodule. At that time (09/08/2018) the nodule measured 1.4 by 1.3 cm. Therefore this nodule has increased in the interval and concerning for recurrence of a serosal implant. The previous described lymph nodes in the sigmoid mesocolon and more central mesentery are not increased in size and not pathologic by size criteria.    Concern for recurrence of serosal metastasis in the proximal sigmoid colon with a 2 cm enlarging lesion. Lesion extends into the lumen. No evidence of high-grade obstruction. Consider FDG PET scan and/or potential colonoscopy for evaluation.   06/29/2019 PET scan   1. Enlarging serosal implant within the proximal sigmoid colon has intense metabolic activity most consistent with malignancy. Lesion exhibits a somewhat indolent progression as present on PET-CT scan from 10/11/2017 and 05/27/2018. 2. Focal hypermetabolic activity within the RIGHT rectus muscle just off midline is slightly decreased from comparison exam. This may be benign inflammation related to prior laparotomy, however malignancy not excluded.   07/26/2019 Procedure   She had colonoscopy which showed multiple polyps.  6 polyps were removed from the cecum, measuring 3 to 6 mm in size.  One 12 mm polyp was removed from ascending colon and another 1 measures 7 mm.  One 5 mm polyp was removed from the transverse colon.  There is tumor noted in the sigmoid colon, approximately 35 cm from the anus which was biopsied.  The tumor appeared to be fungating, infiltrative and ulcerated but nonobstructive.  It encompassed approximately one third of the circumference of the lumen.  07/26/2019 Pathology Results   Multiple polyps came back tubular adenomas.  2 ascending polyps came back sessile serrated adenoma months.  Sigmoid colon biopsy confirmed metastatic high-grade serous carcinoma.  The morphology  and immunophenotype are consistent with metastatic high-grade serous carcinoma from tubal/ovarian primary site.   08/17/2019 -  Chemotherapy   The patient had carboplatin and taxol   10/23/2019 Imaging   1. Sigmoid lesion with diminished size/conspicuity difficult to measure on the previous exam. Adjacent lymph nodes and nodularity less than a cm, largest on coronal image 48 measuring 5 mm. 2. Right rectus muscle with some thickening with mildly convex margin seen posteriorly on image 72 of series 2. This could be due to postsurgical change, however, metastatic disease is not excluded. Attention on follow-up is suggested. 3. No evidence for new metastatic disease. 4. Small hiatal hernia. 5. 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   Aortic Atherosclerosis (ICD10-I70.0).     REVIEW OF SYSTEMS:   Constitutional: Denies fevers, chills or abnormal weight loss Eyes: Denies blurriness of vision Ears, nose, mouth, throat, and face: Denies mucositis or sore throat Respiratory: Denies cough, dyspnea or wheezes Cardiovascular: Denies palpitation, chest discomfort or lower extremity swelling Gastrointestinal:  Denies nausea, heartburn or change in bowel habits Skin: Denies abnormal skin rashes Lymphatics: Denies new lymphadenopathy or easy bruising Neurological:Denies numbness, tingling or new weaknesses Behavioral/Psych: Mood is stable, no new changes  All other systems were reviewed with the patient and are negative.  I have reviewed the past medical history, past surgical history, social history and family history with the patient and they are unchanged from previous note.  ALLERGIES:  is allergic to [employer] tartrate]; bee venom; and cortisone.  MEDICATIONS:  Current Outpatient Medications  Medication Sig Dispense Refill  . Armodafinil 250 MG tablet Take 1 tablet (250 mg total) by mouth daily. 90 tablet 1  . Carboxymethylcellulose Sodium (EYE DROPS OP) Apply 2 drops to eye daily  as needed (dry eye).    . cholecalciferol (VITAMIN D3) 25 MCG (1000 UT) tablet Take 1,000 Units by mouth daily.    . colestipol (COLESTID) 1 g tablet Take 0.5-1 g by mouth daily. Pt taking 1/2 to 1 tablet daily depending on meal choice; taken 1 hour after morning meds , and can not eat for another hour    . dexamethasone (DECADRON) 4 MG tablet Take 2 tabs at the night before and 2 tabs the morning of chemotherapy, every 3 weeks, by mouth x 6 cycles, please dispense 8 pills 8 tablet 6  . EPINEPHrine (EPIPEN 2-PAK) 0.3 mg/0.3 mL DEVI Inject 0.3 mg into the muscle once as needed (For bee stings.). Reported on 01/20/2016    . eszopiclone (LUNESTA) 2 MG TABS tablet TAKE ONE-HALF TO ONE TABLET BY MOUTH EVERY NIGHT AT BEDTIME AS NEEDED (Patient taking differently: Take 2 mg by mouth See admin instructions. TAKE  ONE TABLET BY MOUTH EVERY NIGHT AT BEDTIME) 90 tablet 0  . FLUoxetine (PROZAC) 10 MG capsule Take 10 mg by mouth every morning. Takes with a 27m capsule for her total dose of 325m    . Marland KitchenLUoxetine (PROZAC) 20 MG capsule Take 20 mg by mouth every morning. Takes with a 1080mapsule for her total dose of 64m68m  . ibMarland Kitchenprofen (ADVIL,MOTRIN) 200 MG tablet Take 400-600 mg by mouth every 6 (six) hours as needed for mild pain or moderate pain.    . liMarland Kitchenocaine-prilocaine (EMLA) cream Apply 1 application topically as needed. 30 g 1  .  losartan-hydrochlorothiazide (HYZAAR) 100-12.5 MG tablet Take 1 tablet by mouth daily.     Marland Kitchen MELATONIN PO Take 10 mg by mouth at bedtime.     . metFORMIN (GLUCOPHAGE-XR) 500 MG 24 hr tablet Take 500 mg by mouth daily with breakfast.   3  . Multiple Vitamin (MULTIVITAMIN WITH MINERALS) TABS Take 1 tablet by mouth every morning.     . naproxen sodium (ALEVE) 220 MG tablet Take 220 mg by mouth daily as needed (pain).     . ondansetron (ZOFRAN) 8 MG tablet Take 1 tablet (8 mg total) by mouth every 8 (eight) hours as needed for refractory nausea / vomiting. Start on day 3 after  carboplatin chemo. 30 tablet 1  . prochlorperazine (COMPAZINE) 10 MG tablet Take 1 tablet (10 mg total) by mouth every 6 (six) hours as needed (Nausea or vomiting). 30 tablet 1  . rosuvastatin (CRESTOR) 20 MG tablet Take 20 mg by mouth every morning.      No current facility-administered medications for this visit.   Facility-Administered Medications Ordered in Other Visits  Medication Dose Route Frequency Provider Last Rate Last Admin  . CARBOplatin (PARAPLATIN) 560 mg in sodium chloride 0.9 % 250 mL chemo infusion  560 mg Intravenous Once Alvy Bimler, Katori Wirsing, MD      . dexamethasone (DECADRON) 10 mg in sodium chloride 0.9 % 50 mL IVPB  10 mg Intravenous Once Alvy Bimler, Jameila Keeny, MD 204 mL/hr at 11/21/19 1141 10 mg at 11/21/19 1141  . famotidine (PEPCID) IVPB 20 mg premix  20 mg Intravenous Once Alvy Bimler, Russell Engelstad, MD      . fosaprepitant (EMEND) 150 mg in sodium chloride 0.9 % 145 mL IVPB  150 mg Intravenous Once Alvy Bimler, Desman Polak, MD      . heparin lock flush 100 unit/mL  500 Units Intracatheter Once PRN Alvy Bimler, Lunabelle Oatley, MD      . PACLitaxel (TAXOL) 336 mg in sodium chloride 0.9 % 500 mL chemo infusion (> 73m/m2)  175 mg/m2 (Treatment Plan Recorded) Intravenous Once Airi Copado, MD      . sodium chloride flush (NS) 0.9 % injection 10 mL  10 mL Intravenous PRN GNancy Marus MD   10 mL at 05/12/17 0823  . sodium chloride flush (NS) 0.9 % injection 10 mL  10 mL Intracatheter PRN GAlvy Bimler Maan Zarcone, MD        PHYSICAL EXAMINATION: ECOG PERFORMANCE STATUS: 1 - Symptomatic but completely ambulatory  Vitals:   11/21/19 1054  BP: (!) 158/93  Pulse: (!) 102  Resp: 18  Temp: 98.7 F (37.1 C)  SpO2: 99%   Filed Weights   11/21/19 1054  Weight: 182 lb 6.4 oz (82.7 kg)    GENERAL:alert, no distress and comfortable SKIN: skin color, texture, turgor are normal, no rashes or significant lesions EYES: normal, Conjunctiva are pink and non-injected, sclera clear OROPHARYNX:no exudate, no erythema and lips, buccal mucosa, and tongue  normal  NECK: supple, thyroid normal size, non-tender, without nodularity LYMPH:  no palpable lymphadenopathy in the cervical, axillary or inguinal LUNGS: clear to auscultation and percussion with normal breathing effort HEART: regular rate & rhythm and no murmurs and no lower extremity edema ABDOMEN:abdomen soft, non-tender and normal bowel sounds Musculoskeletal:no cyanosis of digits and no clubbing  NEURO: alert & oriented x 3 with fluent speech, no focal motor/sensory deficits  LABORATORY DATA:  I have reviewed the data as listed    Component Value Date/Time   NA 140 11/21/2019 1033   NA 141 12/02/2016 1219   K  3.9 11/21/2019 1033   K 3.7 12/02/2016 1219   CL 102 11/21/2019 1033   CL 103 01/18/2013 1329   CO2 25 11/21/2019 1033   CO2 27 12/02/2016 1219   GLUCOSE 129 (H) 11/21/2019 1033   GLUCOSE 98 12/02/2016 1219   GLUCOSE 110 (H) 01/18/2013 1329   BUN 14 11/21/2019 1033   BUN 15.3 12/02/2016 1219   CREATININE 0.72 11/21/2019 1033   CREATININE 0.7 12/02/2016 1219   CALCIUM 10.5 (H) 11/21/2019 1033   CALCIUM 10.1 12/02/2016 1219   PROT 7.8 11/21/2019 1033   PROT 7.1 12/02/2016 1219   ALBUMIN 4.3 11/21/2019 1033   ALBUMIN 4.2 12/02/2016 1219   AST 20 11/21/2019 1033   AST 21 12/02/2016 1219   ALT 28 11/21/2019 1033   ALT 27 12/02/2016 1219   ALKPHOS 114 11/21/2019 1033   ALKPHOS 104 12/02/2016 1219   BILITOT 0.3 11/21/2019 1033   BILITOT 0.37 12/02/2016 1219   GFRNONAA >60 11/21/2019 1033   GFRAA >60 11/21/2019 1033    No results found for: SPEP, UPEP  Lab Results  Component Value Date   WBC 4.6 11/21/2019   NEUTROABS 3.9 11/21/2019   HGB 13.0 11/21/2019   HCT 38.7 11/21/2019   MCV 94.6 11/21/2019   PLT 176 11/21/2019      Chemistry      Component Value Date/Time   NA 140 11/21/2019 1033   NA 141 12/02/2016 1219   K 3.9 11/21/2019 1033   K 3.7 12/02/2016 1219   CL 102 11/21/2019 1033   CL 103 01/18/2013 1329   CO2 25 11/21/2019 1033   CO2 27  12/02/2016 1219   BUN 14 11/21/2019 1033   BUN 15.3 12/02/2016 1219   CREATININE 0.72 11/21/2019 1033   CREATININE 0.7 12/02/2016 1219   GLU 236 12/13/2012 1558      Component Value Date/Time   CALCIUM 10.5 (H) 11/21/2019 1033   CALCIUM 10.1 12/02/2016 1219   ALKPHOS 114 11/21/2019 1033   ALKPHOS 104 12/02/2016 1219   AST 20 11/21/2019 1033   AST 21 12/02/2016 1219   ALT 28 11/21/2019 1033   ALT 27 12/02/2016 1219   BILITOT 0.3 11/21/2019 1033   BILITOT 0.37 12/02/2016 1219       RADIOGRAPHIC STUDIES: I have personally reviewed the radiological images as listed and agreed with the findings in the report. CT ABDOMEN PELVIS W CONTRAST  Result Date: 10/23/2019 CLINICAL DATA:  History of ovarian cancer. History of diagnosis in 2014 currently on chemotherapy with postoperative changes of hysterectomy, appendectomy and cholecystectomy. EXAM: CT ABDOMEN AND PELVIS WITH CONTRAST TECHNIQUE: Multidetector CT imaging of the abdomen and pelvis was performed using the standard protocol following bolus administration of intravenous contrast. CONTRAST:  165m OMNIPAQUE IOHEXOL 300 MG/ML  SOLN COMPARISON:  CT of 06/20/2019 and PET scan of 06/29/2019 FINDINGS: Lower chest: Basilar scarring. Minimal atelectasis. No signs of effusion or consolidation. Hepatobiliary: Liver without focal lesion. Portal vein is patent. Post cholecystectomy with mild biliary ductal distension not changed and likely post cholecystectomy baseline. Calcified lesion over the right hemi liver (image 10, series 2) 10 mm. Pancreas: Pancreas is normal without signs of ductal dilation or inflammation. No pancreatic lesion. Spleen: Spleen is normal size without focal lesion. Adrenals/Urinary Tract: Normal adrenal glands. 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 No hydronephrosis. Urinary bladder is normal. Stomach/Bowel: Small hiatal hernia. No signs of acute bowel process. Sigmoid lesion with diminished size/conspicuity difficult to  measure on the previous exam appear smaller.  Not seen on the current exam. Area assessed on the previous exam measuring as much as 2 cm. Adjacent lymph nodes and or nodularity less than a cm, largest on coronal image 48 measuring 5 mm. Vascular/Lymphatic: Calcified atherosclerotic changes without aneurysm in the abdominal aorta. No adenopathy. Reproductive: Post hysterectomy. No pelvic lymphadenopathy. Other: Left inguinal hernia contains fat. Midline postoperative changes are similar to the previous exam. Right rectus muscle with some thickening with mildly convex margin seen extending posteriorly on image 72 of series 2 Musculoskeletal: No acute bone finding or destructive bone process. IMPRESSION: 1. Sigmoid lesion with diminished size/conspicuity difficult to measure on the previous exam. Adjacent lymph nodes and nodularity less than a cm, largest on coronal image 48 measuring 5 mm. 2. Right rectus muscle with some thickening with mildly convex margin seen posteriorly on image 72 of series 2. This could be due to postsurgical change, however, metastatic disease is not excluded. Attention on follow-up is suggested. 3. No evidence for new metastatic disease. 4. Small hiatal hernia. 5. 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 Aortic Atherosclerosis (ICD10-I70.0). Electronically Signed   By: Zetta Bills M.D.   On: 10/23/2019 10:22

## 2019-11-21 NOTE — Assessment & Plan Note (Signed)
She has recovered fully from recent side effects from Covid vaccination We will proceed with treatment without delay

## 2019-11-21 NOTE — Assessment & Plan Note (Signed)
She has no significant elevated blood sugar while on treatment Observe for now We discussed the importance of dietary modification

## 2019-11-21 NOTE — Assessment & Plan Note (Signed)
She has urinary discomfort I will order urinalysis and urine culture to rule out infection

## 2019-11-22 ENCOUNTER — Other Ambulatory Visit: Payer: Self-pay | Admitting: Hematology and Oncology

## 2019-11-22 ENCOUNTER — Other Ambulatory Visit: Payer: Self-pay

## 2019-11-22 ENCOUNTER — Telehealth: Payer: Self-pay

## 2019-11-22 DIAGNOSIS — N390 Urinary tract infection, site not specified: Secondary | ICD-10-CM

## 2019-11-22 MED ORDER — SULFAMETHOXAZOLE-TRIMETHOPRIM 800-160 MG PO TABS: 1.0000 | ORAL_TABLET | Freq: Two times a day (BID) | ORAL | 0 refills | Status: DC

## 2019-11-22 NOTE — Telephone Encounter (Signed)
Called per Dr. Alvy Bimler. Urine culture cam back suspicious for e coli. Rx sent to pharmacy for Bactrim. She verbalized understanding. Sent Rx per her request to [facility] outpatient pharmacy.

## 2019-11-23 LAB — URINE CULTURE: Culture: >=100000 — AB

## 2019-11-25 MED FILL — SULFAMETHOXAZOLE-TMP DS TAB: 800-160 | 3 days supply | Qty: 6 | Fill #0

## 2019-12-01 ENCOUNTER — Encounter: Payer: Self-pay | Admitting: Hematology and Oncology

## 2019-12-06 NOTE — Progress Notes (Signed)
Pharmacist Chemotherapy Monitoring - Follow Up Assessment    I verify that I have reviewed each item in the below checklist:  . Regimen for the patient is scheduled for the appropriate day and plan matches scheduled date. Marland Kitchen Appropriate non-routine labs are ordered dependent on drug ordered. . If applicable, additional medications reviewed and ordered per protocol based on lifetime cumulative doses and/or treatment regimen.   Plan for follow-up and/or issues identified: No . I-vent associated with next due treatment: No . MD and/or nursing notified: No  Dior Dominik K 12/06/2019 1:23 PM

## 2019-12-12 ENCOUNTER — Inpatient Hospital Stay: Payer: Medicare HMO

## 2019-12-12 ENCOUNTER — Inpatient Hospital Stay: Payer: Medicare HMO | Admitting: Hematology and Oncology

## 2019-12-12 ENCOUNTER — Inpatient Hospital Stay (HOSPITAL_BASED_OUTPATIENT_CLINIC_OR_DEPARTMENT_OTHER): Payer: Medicare HMO | Admitting: Medical

## 2019-12-12 ENCOUNTER — Encounter: Payer: Self-pay | Admitting: Hematology and Oncology

## 2019-12-12 ENCOUNTER — Other Ambulatory Visit: Payer: Self-pay | Admitting: Hematology and Oncology

## 2019-12-12 ENCOUNTER — Other Ambulatory Visit: Payer: Self-pay

## 2019-12-12 ENCOUNTER — Inpatient Hospital Stay: Payer: Medicare HMO | Attending: Gynecologic Oncology

## 2019-12-12 VITALS — BP 147/90 | HR 114 | Temp 97.8°F | Resp 18 | Ht 62.0 in | Wt 184.6 lb

## 2019-12-12 DIAGNOSIS — E119 Type 2 diabetes mellitus without complications: Secondary | ICD-10-CM | POA: Diagnosis not present

## 2019-12-12 DIAGNOSIS — M25512 Pain in left shoulder: Secondary | ICD-10-CM

## 2019-12-12 DIAGNOSIS — Z7984 Long term (current) use of oral hypoglycemic drugs: Secondary | ICD-10-CM | POA: Diagnosis not present

## 2019-12-12 DIAGNOSIS — H269 Unspecified cataract: Secondary | ICD-10-CM | POA: Diagnosis not present

## 2019-12-12 DIAGNOSIS — G620 Drug-induced polyneuropathy: Secondary | ICD-10-CM | POA: Diagnosis not present

## 2019-12-12 DIAGNOSIS — C5701 Malignant neoplasm of right fallopian tube: Secondary | ICD-10-CM | POA: Diagnosis not present

## 2019-12-12 DIAGNOSIS — M542 Cervicalgia: Secondary | ICD-10-CM

## 2019-12-12 DIAGNOSIS — Z7189 Other specified counseling: Secondary | ICD-10-CM

## 2019-12-12 DIAGNOSIS — C5700 Malignant neoplasm of unspecified fallopian tube: Secondary | ICD-10-CM

## 2019-12-12 DIAGNOSIS — D696 Thrombocytopenia, unspecified: Secondary | ICD-10-CM | POA: Diagnosis not present

## 2019-12-12 DIAGNOSIS — Z791 Long term (current) use of non-steroidal anti-inflammatories (NSAID): Secondary | ICD-10-CM | POA: Diagnosis not present

## 2019-12-12 DIAGNOSIS — M5124 Other intervertebral disc displacement, thoracic region: Secondary | ICD-10-CM | POA: Insufficient documentation

## 2019-12-12 DIAGNOSIS — Z90710 Acquired absence of both cervix and uterus: Secondary | ICD-10-CM | POA: Insufficient documentation

## 2019-12-12 DIAGNOSIS — Z79899 Other long term (current) drug therapy: Secondary | ICD-10-CM | POA: Diagnosis not present

## 2019-12-12 DIAGNOSIS — T451X5A Adverse effect of antineoplastic and immunosuppressive drugs, initial encounter: Secondary | ICD-10-CM

## 2019-12-12 DIAGNOSIS — Z9079 Acquired absence of other genital organ(s): Secondary | ICD-10-CM | POA: Diagnosis not present

## 2019-12-12 DIAGNOSIS — Z5111 Encounter for antineoplastic chemotherapy: Secondary | ICD-10-CM | POA: Insufficient documentation

## 2019-12-12 DIAGNOSIS — Z7952 Long term (current) use of systemic steroids: Secondary | ICD-10-CM | POA: Insufficient documentation

## 2019-12-12 DIAGNOSIS — M25511 Pain in right shoulder: Secondary | ICD-10-CM

## 2019-12-12 DIAGNOSIS — Z90722 Acquired absence of ovaries, bilateral: Secondary | ICD-10-CM | POA: Insufficient documentation

## 2019-12-12 LAB — CMP (CANCER CENTER ONLY)
ALT: 28 U/L (ref 0–44)
AST: 20 U/L (ref 15–41)
Albumin: 4.2 g/dL (ref 3.5–5.0)
Alkaline Phosphatase: 104 U/L (ref 38–126)
Anion gap: 11 (ref 5–15)
BUN: 18 mg/dL (ref 8–23)
CO2: 25 mmol/L (ref 22–32)
Calcium: 9.8 mg/dL (ref 8.9–10.3)
Chloride: 103 mmol/L (ref 98–111)
Creatinine: 0.79 mg/dL (ref 0.44–1.00)
GFR, Est AFR Am: >60 mL/min (ref >60)
GFR, Estimated: >60 mL/min (ref >60)
Glucose, Bld: 265 mg/dL — ABNORMAL HIGH (ref 70–99)
Potassium: 3.6 mmol/L (ref 3.5–5.1)
Sodium: 139 mmol/L (ref 135–145)
Total Bilirubin: 0.3 mg/dL (ref 0.3–1.2)
Total Protein: 7.6 g/dL (ref 6.5–8.1)

## 2019-12-12 LAB — CBC WITH DIFFERENTIAL (CANCER CENTER ONLY)
Abs Immature Granulocytes: 0.04 10*3/uL (ref 0.00–0.07)
Basophils Absolute: 0 10*3/uL (ref 0.0–0.1)
Basophils Relative: 0 %
Eosinophils Absolute: 0 10*3/uL (ref 0.0–0.5)
Eosinophils Relative: 0 %
HCT: 37.5 % (ref 36.0–46.0)
Hemoglobin: 12.4 g/dL (ref 12.0–15.0)
Immature Granulocytes: 1 %
Lymphocytes Relative: 11 %
Lymphs Abs: 0.5 10*3/uL — ABNORMAL LOW (ref 0.7–4.0)
MCH: 31.8 pg (ref 26.0–34.0)
MCHC: 33.1 g/dL (ref 30.0–36.0)
MCV: 96.2 fL (ref 80.0–100.0)
Monocytes Absolute: 0.1 10*3/uL (ref 0.1–1.0)
Monocytes Relative: 2 %
Neutro Abs: 4.1 10*3/uL (ref 1.7–7.7)
Neutrophils Relative %: 86 %
Platelet Count: 110 10*3/uL — ABNORMAL LOW (ref 150–400)
RBC: 3.9 MIL/uL (ref 3.87–5.11)
RDW: 15 % (ref 11.5–15.5)
WBC Count: 4.8 10*3/uL (ref 4.0–10.5)
nRBC: 0 % (ref 0.0–0.2)

## 2019-12-12 MED ORDER — ACETAMINOPHEN 325 MG PO TABS
ORAL_TABLET | ORAL | Status: AC
  Filled 2019-12-12: qty 2

## 2019-12-12 MED ORDER — DIPHENHYDRAMINE HCL 50 MG/ML IJ SOLN
50.0000 mg | Freq: Once | INTRAMUSCULAR | Status: AC
  Administered 2019-12-12: 50 mg via INTRAVENOUS

## 2019-12-12 MED ORDER — INSULIN REGULAR HUMAN 100 UNIT/ML IJ SOLN
10.0000 [IU] | Freq: Once | INTRAMUSCULAR | Status: AC
  Administered 2019-12-12: 10 [IU] via SUBCUTANEOUS

## 2019-12-12 MED ORDER — SODIUM CHLORIDE 0.9% FLUSH
10.0000 mL | INTRAVENOUS | Status: DC | PRN
  Administered 2019-12-12: 16:00:00 10 mL
  Filled 2019-12-12: qty 10

## 2019-12-12 MED ORDER — INSULIN REGULAR HUMAN 100 UNIT/ML IJ SOLN
INTRAMUSCULAR | Status: AC
  Filled 2019-12-12: qty 1

## 2019-12-12 MED ORDER — SODIUM CHLORIDE 0.9% FLUSH
10.0000 mL | Freq: Once | INTRAVENOUS | Status: AC
  Administered 2019-12-12: 10 mL
  Filled 2019-12-12: qty 10

## 2019-12-12 MED ORDER — SODIUM CHLORIDE 0.9 % IV SOLN
Freq: Once | INTRAVENOUS | Status: AC
  Filled 2019-12-12: qty 250

## 2019-12-12 MED ORDER — ACETAMINOPHEN 325 MG PO TABS
650.0000 mg | ORAL_TABLET | Freq: Once | ORAL | Status: AC
  Administered 2019-12-12: 15:00:00 650 mg via ORAL

## 2019-12-12 MED ORDER — FAMOTIDINE IN NACL 20-0.9 MG/50ML-% IV SOLN
INTRAVENOUS | Status: AC
  Filled 2019-12-12: qty 50

## 2019-12-12 MED ORDER — DIPHENHYDRAMINE HCL 50 MG/ML IJ SOLN
INTRAMUSCULAR | Status: AC
  Filled 2019-12-12: qty 1

## 2019-12-12 MED ORDER — PALONOSETRON HCL INJECTION 0.25 MG/5ML
INTRAVENOUS | Status: AC
  Filled 2019-12-12: qty 5

## 2019-12-12 MED ORDER — HEPARIN SOD (PORK) LOCK FLUSH 100 UNIT/ML IV SOLN
500.0000 [IU] | Freq: Once | INTRAVENOUS | Status: AC | PRN
  Administered 2019-12-12: 16:00:00 500 [IU]
  Filled 2019-12-12: qty 5

## 2019-12-12 MED ORDER — FAMOTIDINE IN NACL 20-0.9 MG/50ML-% IV SOLN
20.0000 mg | Freq: Once | INTRAVENOUS | Status: AC
  Administered 2019-12-12: 11:00:00 20 mg via INTRAVENOUS

## 2019-12-12 MED ORDER — SODIUM CHLORIDE 0.9 % IV SOLN
556.2000 mg | Freq: Once | INTRAVENOUS | Status: AC
  Administered 2019-12-12: 560 mg via INTRAVENOUS
  Filled 2019-12-12: qty 56

## 2019-12-12 MED ORDER — PALONOSETRON HCL INJECTION 0.25 MG/5ML
0.2500 mg | Freq: Once | INTRAVENOUS | Status: AC
  Administered 2019-12-12: 11:00:00 0.25 mg via INTRAVENOUS

## 2019-12-12 MED ORDER — SODIUM CHLORIDE 0.9 % IV SOLN
150.0000 mg | Freq: Once | INTRAVENOUS | Status: AC
  Administered 2019-12-12: 11:00:00 150 mg via INTRAVENOUS
  Filled 2019-12-12: qty 150

## 2019-12-12 MED ORDER — SODIUM CHLORIDE 0.9 % IV SOLN
10.0000 mg | Freq: Once | INTRAVENOUS | Status: AC
  Administered 2019-12-12: 10 mg via INTRAVENOUS
  Filled 2019-12-12: qty 10

## 2019-12-12 MED ORDER — SODIUM CHLORIDE 0.9 % IV SOLN
175.0000 mg/m2 | Freq: Once | INTRAVENOUS | Status: AC
  Administered 2019-12-12: 12:00:00 336 mg via INTRAVENOUS
  Filled 2019-12-12: qty 56

## 2019-12-12 NOTE — Progress Notes (Signed)
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[facility]) She has multiple little side effects from treatment but overall manageable We will proceed with cycle 6 of treatment today I plan to repeat CT imaging at the end of the month for further follow-up If she have good response to treatment, I recommend we switch her to maintenance treatment I discussed briefly with her choices of maintenance treatment including possible bevacizumab versus niraparib Side effects were discussed with the patient and she is leaning to its bevacizumab, however, would like to proceed with cataract surgery We will discuss further choices of treatment when I see her back in the next visit  Newly diagnosed diabetes ([place]) Her blood sugar is very high I will prescribe some insulin treatment today  Thrombocytopenia ([place]) This is likely due to recent treatment. The patient denies recent history of bleeding such as epistaxis, hematuria or hematochezia. She is asymptomatic from the low platelet count. I will observe for now.  she does not require transfusion now. I will continue the chemotherapy at current dose without dosage adjustment.  Peripheral neuropathy due to chemotherapy [facility]) she has mild peripheral neuropathy, likely related to side effects of treatment. It is only mild, not bothering the patient. I will observe for now  Bilateral cataracts She is contemplating cataract surgery I recommend she makes contact with her ophthalmologist She should be able to proceed with surgery at the end of the month after I see her with results of CT imaging   Orders Placed This Encounter  Procedures  . CT ABDOMEN PELVIS W CONTRAST    Standing Status:   Future    Standing Expiration Date:   12/11/2020    Order Specific Question:   If indicated  for the ordered procedure, I authorize the administration of contrast media per Radiology protocol    Answer:   Yes    Order Specific Question:   Preferred imaging location?    Answer:   [facility]    Order Specific Question:   Radiology Contrast Protocol - do NOT remove file path    Answer:   [email or web address]    All questions were answered. The patient knows to call the clinic with any problems, questions or concerns. The total time spent in the appointment was 30 minutes encounter with patients including review of chart and various tests results, discussions about plan of care and coordination of care plan   [name], MD 12/12/2019 2:00 PM  INTERVAL HISTORY: Please see below for problem oriented charting. She returns for cycle 6 of chemotherapy She tolerated last cycle of treatment well No worsening neuropathy Denies nausea or constipation She is wondering when she can have her cataract surgery She has blurriness of vision due to treatment No recent infection, fever or chills SUMMARY OF ONCOLOGIC HISTORY: Oncology History Overview Note  Oncologic Summary: 1. 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 Random CA 125 elevation January 2019  Question mesenteric nodules and anterior abdominal wall nodule 3. GeneDx Breast/Ovary Panel negative (including BRCA, MMR's, RAD51 etc) ? [facility] BRACAnalysis  Negative for BRCA1/2 in tumor   Fallopian tube carcinoma ([place])  11/01/2012 Imaging  Ct abdomen 1.  Interval development of large mid abdominal mass highly concerning for right ovarian cancer.  There is mild omental nodularity on the left, and peritoneal disease cannot be completely excluded.  There is no ascites  or other evidence of metastatic disease. 2.  Mild associated renal pelvocaliectasis bilaterally without obstruction.  Nonobstructing left renal calculus and a small right renal angiomyolipoma noted incidentally.   11/08/2012 Pathology Results   1. 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 - BENIGN FALLOPIAN TUBE WITH NONSPECIFIC CHRONIC INFLAMMATION. 2. Ovary and fallopian tube, left - BENIGN OVARY; NEGATIVE FOR ATYPIA OR MALIGNANCY. - BENIGN FALLOPIAN TUBE; NEGATIVE FOR ATYPIA OR MALIGNANCY. 3. Omentum, resection for tumor - HIGH GRADE CARCINOMA, SEE COMMENT. 4. Appendix, Other than Incidental - FIBROUS OBLITERATION OF APPENDICEAL TIP. - NEGATIVE FOR MALIGNANCY.   11/08/2012 Surgery   Surgery: Exploratory laparotomy, bilateral salpingo-oophorectomy, appendectomy, infacolic omentectomy, optimal debulking  Surgeons:  Paola A. Alycia Rossetti, MD; Lahoma Crocker, MD   Assistant: Caswell Corwin  Pathology: Bilateral fallopian tubes and ovaries to pathology. Appendix as well as omentum. 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  Operative findings: 25 cm right adnexal mass with smooth surface. Surgically absent uterus. Atrophic-appearing left ovary. Normal appearing appendix. Within the omentum there were centimeter nodules scattered throughout the omentum. The remainder of the surfaces were benign.   12/08/2012 Procedure   Impression:  Placement of a subcutaneous port device.  The catheter tip is in the lower SVC and ready to be used.    12/13/2012 - 03/28/2013 Chemotherapy   s/p 6 cycles of paclitaxel and carboplatin   12/13/2012 - 03/28/2013 Chemotherapy   The patient had 6 cycles of carboplatin and Taxol   03/24/2013 Imaging   US abdomen   04/24/2013 Imaging    CT abdomen Interval resection of the large right pelvic and lower abdominal mass lesion with apparent omentectomy.  No evidence for intraperitoneal free fluid on today's study.  No discernible peritoneal lesions.  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   09/07/2013 Genetic Testing   Patient has genetic testing done for BRCA1/2 panel Results revealed patient has no mutation(s):   07/09/2015 Imaging   CT abdomen 1. 12 mm obstructive calculus at the left ureteropelvic junction with moderate proximal hydronephrosis. 2. 2 small supraumbilical ventral hernias, one containing a short segment of the mid transverse colon and the other containing a short segment of the mid small bowel. There is no associated evidence to suggest bowel incarceration or obstruction at this time. 3. Tiny locule of gas non dependently in the lumen of the urinary bladder. This is presumably iatrogenic related to recent catheterization for urinalysis. Alternatively, this could be seen in the setting of urinary tract infection with gas-forming organisms. Clinical correlation for history of recent catheterization is recommended. 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 5. Status post cholecystectomy. 6. Additional incidental findings, as above.    12/11/2016 Imaging   Ct abdomen 1. No evidence of metastatic ovarian cancer. 2. Recurrent subxiphoid ventral abdominal wall hernia containing transverse colon. No evidence of incarceration or obstruction. 3. Stable incidental findings in the liver and kidneys. No recurrent urinary tract calculus. 4. Progressive lower lumbar spondylosis. 5.  Aortic Atherosclerosis (ICD10-I70.0).    08/30/2017 Imaging   MRI thoracic spine 1.  At T5-6 there is a small central disc protrusion contacting the ventral thoracic spinal cord. No central canal or foraminal stenosis. 2. At T9-10 there is a small right paracentral disc protrusion. 3.  No acute osseous injury of  the thoracic spine. 4. No aggressive osseous lesion to suggest metastatic disease.   09/24/2017 Tumor Marker   Patient's tumor was tested for the following markers: CA-125 Results of the tumor marker test revealed 21.7   09/30/2017 Imaging   CT abdomen 1. New small clustered soft tissue nodules in the left lower quadrant in the sigmoid mesentery, largest 1.0 cm, which could represent recurrent peritoneal tumor implants. No ascites.  2. Midline high ventral abdominal wall hernia containing a portion of the transverse colon is mildly increased in size, and without bowel complication at this time. 3. Chronic findings include: Aortic Atherosclerosis (ICD10-I70.0). Diffuse hepatic steatosis. 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   10/11/2017 PET scan   1. Nodules in the sigmoid mesentery are hypermetabolic and highly worrisome for metastatic disease. 2. Attic steatosis.    11/03/2017 Procedure   Successful CT-guided rectus abdominal muscle mass core biopsy. Path: - FOREIGN BODY GIANT CELL REACTION INVOLVING FIBROADIPOSE TISSUE AND SKELETAL MUSCLE. - NO EVIDENCE OF MALIGNANCY.   11/04/2017 Cancer Staging   Staging form: Fallopian Tube, AJCC 7th Edition - Clinical: FIGO Stage IIIC, calculated as Stage IV (rT3, N0, M1) - Signed by [name], MD on 08/09/2019   02/2018 Imaging   CT: 1. Continued increase in size small peritoneal nodules along the mesenteric border of the proximal sigmoid colon as well as along the serosal surface of the proximal sigmoid colon. 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 2. No evidence of distant disease. 3. Stable large ventral hernia.   05/2018 Imaging   PET: 1. Redemonstration of hypermetabolic nodules within the sigmoid mesocolon. Mild response to therapy relative to CT of 02/24/2018. Mixed response to therapy compared to the most recent PET of 10/11/2017. 2.  Hypermetabolism within the right pelvic rectus musculature, increased since the prior PET.  Clinical service requested comparison to the 11/24/2017 CT. Index 10 mm nodule within the sigmoid mesocolon was similar to the 02/24/2018 CT, and as described on that exam, increased from 7 mm on 11/24/2017. More inferior nodule within the mesocolon measures 12 mm today on image 156/4 and 8 mm on 11/24/2017.   05/2018 Imaging   CT: IMPRESSION: 1. Since 02/24/2018, decreased size of peritoneal nodules centered in the sigmoid mesocolon. 2. No evidence of new or progressive disease. 3. Hepatic steatosis. 4. Subcentimeter right renal angiomyolipoma, similar. 5.  Aortic Atherosclerosis (ICD10-I70.0). 6. Ventral abdominal wall laxity containing transverse colon, similar.   08/2018 Imaging   CT: IMPRESSION: 1. Nodules within the sigmoid mesocolon have decreased in size compared to prior. No new peritoneal or omental nodularity. 2. No evidence local recurrence the pelvis. 3. Ventral hernia contains a segment of transverse colon. No change from prior.     06/19/2019 Imaging   1. No evidence of metastatic disease in the abdomen pelvis. 2. No peritoneal or omental metastasis identified.  No free fluid. 3. Postcholecystectomy and hysterectomy. Comparison exams are made available. Comparison CT 09/08/2018 and 05/27/2018. PET-CT 05/27/2018    There is a nodule within the proximal aspect of the sigmoid colon measuring 2.2 by 2.2 cm. In comparison to prior CTs and PET-CT there was a hypermetabolic nodule at this location on the PET-CT of 08/27/2017 and on the  CT of 09/08/2018 there was a small residual nodule. At that time (09/08/2018) the nodule measured 1.4 by 1.3 cm. Therefore this nodule has increased in the interval and concerning for recurrence of a serosal implant. The previous described lymph nodes in the sigmoid mesocolon and more central mesentery are not increased in size and not pathologic by size  criteria.    Concern for recurrence of serosal metastasis in the proximal sigmoid colon with a 2 cm enlarging lesion. Lesion extends into the lumen. No evidence of high-grade obstruction. Consider FDG PET scan and/or potential colonoscopy for evaluation.   06/29/2019 PET scan   1. Enlarging serosal implant within the proximal sigmoid colon has intense metabolic activity most consistent with malignancy. Lesion exhibits a somewhat indolent progression as present on PET-CT scan from 10/11/2017 and 05/27/2018. 2. Focal hypermetabolic activity within the RIGHT rectus muscle just off midline is slightly decreased from comparison exam. This may be benign inflammation related to prior laparotomy, however malignancy not excluded.   07/26/2019 Procedure   She had colonoscopy which showed multiple polyps.  6 polyps were removed from the cecum, measuring 3 to 6 mm in size.  One 12 mm polyp was removed from ascending colon and another 1 measures 7 mm.  One 5 mm polyp was removed from the transverse colon.  There is tumor noted in the sigmoid colon, approximately 35 cm from the anus which was biopsied.  The tumor appeared to be fungating, infiltrative and ulcerated but nonobstructive.  It encompassed approximately one third of the circumference of the lumen.   07/26/2019 Pathology Results   Multiple polyps came back tubular adenomas.  2 ascending polyps came back sessile serrated adenoma months.  Sigmoid colon biopsy confirmed metastatic high-grade serous carcinoma.  The morphology and immunophenotype are consistent with metastatic high-grade serous carcinoma from tubal/ovarian primary site.   08/17/2019 -  Chemotherapy   The patient had carboplatin and taxol   10/23/2019 Imaging   1. Sigmoid lesion with diminished size/conspicuity difficult to measure on the previous exam. Adjacent lymph nodes and nodularity less than a cm, largest on coronal image 48 measuring 5 mm. 2. Right rectus muscle with some thickening  with mildly convex margin seen posteriorly on image 72 of series 2. This could be due to postsurgical change, however, metastatic disease is not excluded. Attention on follow-up is suggested. 3. No evidence for new metastatic disease. 4. Small hiatal hernia. 5. 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   Aortic Atherosclerosis (ICD10-I70.0).     REVIEW OF SYSTEMS:   Constitutional: Denies fevers, chills or abnormal weight loss Ears, nose, mouth, throat, and face: Denies mucositis or sore throat Respiratory: Denies cough, dyspnea or wheezes Cardiovascular: Denies palpitation, chest discomfort or lower extremity swelling Gastrointestinal:  Denies nausea, heartburn or change in bowel habits Skin: Denies abnormal skin rashes Lymphatics: Denies new lymphadenopathy or easy bruising Behavioral/Psych: Mood is stable, no new changes  All other systems were reviewed with the patient and are negative.  I have reviewed the past medical history, past surgical history, social history and family history with the patient and they are unchanged from previous note.  ALLERGIES:  is allergic to [employer] tartrate]; bee venom; and cortisone.  MEDICATIONS:  Current Outpatient Medications  Medication Sig Dispense Refill  . Armodafinil 250 MG tablet Take 1 tablet (250 mg total) by mouth daily. 90 tablet 1  . Carboxymethylcellulose Sodium (EYE DROPS OP) Apply 2 drops to eye daily as needed (dry eye).    Marland Kitchen  cholecalciferol (VITAMIN D3) 25 MCG (1000 UT) tablet Take 1,000 Units by mouth daily.    . colestipol (COLESTID) 1 g tablet Take 0.5-1 g by mouth daily. Pt taking 1/2 to 1 tablet daily depending on meal choice; taken 1 hour after morning meds , and can not eat for another hour    . dexamethasone (DECADRON) 4 MG tablet Take 2 tabs at the night before and 2 tabs the morning of chemotherapy, every 3 weeks, by mouth x 6 cycles, please dispense 8 pills 8 tablet 6  . EPINEPHrine (EPIPEN 2-PAK) 0.3 mg/0.3  mL DEVI Inject 0.3 mg into the muscle once as needed (For bee stings.). Reported on 01/20/2016    . eszopiclone (LUNESTA) 2 MG TABS tablet TAKE ONE-HALF TO ONE TABLET BY MOUTH EVERY NIGHT AT BEDTIME AS NEEDED (Patient taking differently: Take 2 mg by mouth See admin instructions. TAKE  ONE TABLET BY MOUTH EVERY NIGHT AT BEDTIME) 90 tablet 0  . FLUoxetine (PROZAC) 10 MG capsule Take 10 mg by mouth every morning. Takes with a 68m capsule for her total dose of 353m    . Marland KitchenLUoxetine (PROZAC) 20 MG capsule Take 20 mg by mouth every morning. Takes with a 1021mapsule for her total dose of 21m34m  . ibMarland Kitchenprofen (ADVIL,MOTRIN) 200 MG tablet Take 400-600 mg by mouth every 6 (six) hours as needed for mild pain or moderate pain.    . liMarland Kitchenocaine-prilocaine (EMLA) cream Apply 1 application topically as needed. 30 g 1  . losartan-hydrochlorothiazide (HYZAAR) 100-12.5 MG tablet Take 1 tablet by mouth daily.     . MEMarland KitchenATONIN PO Take 10 mg by mouth at bedtime.     . metFORMIN (GLUCOPHAGE-XR) 500 MG 24 hr tablet Take 500 mg by mouth daily with breakfast.   3  . Multiple Vitamin (MULTIVITAMIN WITH MINERALS) TABS Take 1 tablet by mouth every morning.     . naproxen sodium (ALEVE) 220 MG tablet Take 220 mg by mouth daily as needed (pain).     . ondansetron (ZOFRAN) 8 MG tablet Take 1 tablet (8 mg total) by mouth every 8 (eight) hours as needed for refractory nausea / vomiting. Start on day 3 after carboplatin chemo. 30 tablet 1  . prochlorperazine (COMPAZINE) 10 MG tablet Take 1 tablet (10 mg total) by mouth every 6 (six) hours as needed (Nausea or vomiting). 30 tablet 1  . rosuvastatin (CRESTOR) 20 MG tablet Take 20 mg by mouth every morning.      No current facility-administered medications for this visit.   Facility-Administered Medications Ordered in Other Visits  Medication Dose Route Frequency Provider Last Rate Last Admin  . CARBOplatin (PARAPLATIN) 560 mg in sodium chloride 0.9 % 250 mL chemo infusion  560 mg  Intravenous Once GorsAlvy Bimler, MD      . heparin lock flush 100 unit/mL  500 Units Intracatheter Once PRN GorsAlvy Bimler, MD      . PACLitaxel (TAXOL) 336 mg in sodium chloride 0.9 % 500 mL chemo infusion (> 80mg60m  175 mg/m2 (Treatment Plan Recorded) Intravenous Once GorsuHeath Lark185 mL/hr at 12/12/19 1224 336 mg at 12/12/19 1224  . sodium chloride flush (NS) 0.9 % injection 10 mL  10 mL Intravenous PRN GehriNancy Marus  10 mL at 05/12/17 0823  . sodium chloride flush (NS) 0.9 % injection 10 mL  10 mL Intracatheter PRN GorsuHeath Lark       PHYSICAL EXAMINATION: ECOG PERFORMANCE STATUS: 1 - Symptomatic but  completely ambulatory  Vitals:   12/12/19 0940  BP: (!) 147/90  Pulse: (!) 114  Resp: 18  Temp: 97.8 F (36.6 C)  SpO2: 100%   Filed Weights   12/12/19 0940  Weight: 184 lb 9.6 oz (83.7 kg)    GENERAL:alert, no distress and comfortable SKIN: skin color, texture, turgor are normal, no rashes or significant lesions EYES: normal, Conjunctiva are pink and non-injected, sclera clear OROPHARYNX:no exudate, no erythema and lips, buccal mucosa, and tongue normal  NECK: supple, thyroid normal size, non-tender, without nodularity LYMPH:  no palpable lymphadenopathy in the cervical, axillary or inguinal LUNGS: clear to auscultation and percussion with normal breathing effort HEART: regular rate & rhythm and no murmurs and no lower extremity edema ABDOMEN:abdomen soft, non-tender and normal bowel sounds Musculoskeletal:no cyanosis of digits and no clubbing  NEURO: alert & oriented x 3 with fluent speech, no focal motor/sensory deficits  LABORATORY DATA:  I have reviewed the data as listed    Component Value Date/Time   NA 139 12/12/2019 0920   NA 141 12/02/2016 1219   K 3.6 12/12/2019 0920   K 3.7 12/02/2016 1219   CL 103 12/12/2019 0920   CL 103 01/18/2013 1329   CO2 25 12/12/2019 0920   CO2 27 12/02/2016 1219   GLUCOSE 265 (H) 12/12/2019 0920   GLUCOSE 98 12/02/2016 1219    GLUCOSE 110 (H) 01/18/2013 1329   BUN 18 12/12/2019 0920   BUN 15.3 12/02/2016 1219   CREATININE 0.79 12/12/2019 0920   CREATININE 0.7 12/02/2016 1219   CALCIUM 9.8 12/12/2019 0920   CALCIUM 10.1 12/02/2016 1219   PROT 7.6 12/12/2019 0920   PROT 7.1 12/02/2016 1219   ALBUMIN 4.2 12/12/2019 0920   ALBUMIN 4.2 12/02/2016 1219   AST 20 12/12/2019 0920   AST 21 12/02/2016 1219   ALT 28 12/12/2019 0920   ALT 27 12/02/2016 1219   ALKPHOS 104 12/12/2019 0920   ALKPHOS 104 12/02/2016 1219   BILITOT 0.3 12/12/2019 0920   BILITOT 0.37 12/02/2016 1219   GFRNONAA >60 12/12/2019 0920   GFRAA >60 12/12/2019 0920    No results found for: SPEP, UPEP  Lab Results  Component Value Date   WBC 4.8 12/12/2019   NEUTROABS 4.1 12/12/2019   HGB 12.4 12/12/2019   HCT 37.5 12/12/2019   MCV 96.2 12/12/2019   PLT 110 (L) 12/12/2019      Chemistry      Component Value Date/Time   NA 139 12/12/2019 0920   NA 141 12/02/2016 1219   K 3.6 12/12/2019 0920   K 3.7 12/02/2016 1219   CL 103 12/12/2019 0920   CL 103 01/18/2013 1329   CO2 25 12/12/2019 0920   CO2 27 12/02/2016 1219   BUN 18 12/12/2019 0920   BUN 15.3 12/02/2016 1219   CREATININE 0.79 12/12/2019 0920   CREATININE 0.7 12/02/2016 1219   GLU 236 12/13/2012 1558      Component Value Date/Time   CALCIUM 9.8 12/12/2019 0920   CALCIUM 10.1 12/02/2016 1219   ALKPHOS 104 12/12/2019 0920   ALKPHOS 104 12/02/2016 1219   AST 20 12/12/2019 0920   AST 21 12/02/2016 1219   ALT 28 12/12/2019 0920   ALT 27 12/02/2016 1219   BILITOT 0.3 12/12/2019 0920   BILITOT 0.37 12/02/2016 1219

## 2019-12-12 NOTE — Assessment & Plan Note (Signed)
Her blood sugar is very high I will prescribe some insulin treatment today

## 2019-12-12 NOTE — Assessment & Plan Note (Signed)
she has mild peripheral neuropathy, likely related to side effects of treatment. It is only mild, not bothering the patient. I will observe for now 

## 2019-12-12 NOTE — Progress Notes (Signed)
Per Dr. Alvy Bimler; okay to treat with elevated HR, also pt will get insulin for blood sugar of 265.

## 2019-12-12 NOTE — Assessment & Plan Note (Signed)
This is likely due to recent treatment. The patient denies recent history of bleeding such as epistaxis, hematuria or hematochezia. She is asymptomatic from the low platelet count. I will observe for now.  she does not require transfusion now. I will continue the chemotherapy at current dose without dosage adjustment.  

## 2019-12-12 NOTE — Patient Instructions (Signed)
[facility] Discharge Instructions for Patients Receiving Chemotherapy  Today you received the following chemotherapy agents Paclitaxel (TAXOL) & Carboplatin (PARAPLATIN).  To help prevent nausea and vomiting after your treatment, we encourage you to take your nausea medication as prescribed.  If you develop nausea and vomiting that is not controlled by your nausea medication, call the clinic.   BELOW ARE SYMPTOMS THAT SHOULD BE REPORTED IMMEDIATELY:  *FEVER GREATER THAN 100.5 F  *CHILLS WITH OR WITHOUT FEVER  NAUSEA AND VOMITING THAT IS NOT CONTROLLED WITH YOUR NAUSEA MEDICATION  *UNUSUAL SHORTNESS OF BREATH  *UNUSUAL BRUISING OR BLEEDING  TENDERNESS IN MOUTH AND THROAT WITH OR WITHOUT PRESENCE OF ULCERS  *URINARY PROBLEMS  *BOWEL PROBLEMS  UNUSUAL RASH Items with * indicate a potential emergency and should be followed up as soon as possible.  Feel free to call the clinic should you have any questions or concerns. The clinic phone number is [phone].  Please show the CHEMO ALERT CARD at check-in to the Emergency Department and triage nurse.   

## 2019-12-12 NOTE — Assessment & Plan Note (Signed)
She is contemplating cataract surgery I recommend she makes contact with her ophthalmologist She should be able to proceed with surgery at the end of the month after I see her with results of CT imaging

## 2019-12-12 NOTE — Assessment & Plan Note (Signed)
She has multiple little side effects from treatment but overall manageable We will proceed with cycle 6 of treatment today I plan to repeat CT imaging at the end of the month for further follow-up If she have good response to treatment, I recommend we switch her to maintenance treatment I discussed briefly with her choices of maintenance treatment including possible bevacizumab versus niraparib Side effects were discussed with the patient and she is leaning to its bevacizumab, however, would like to proceed with cataract surgery We will discuss further choices of treatment when I see her back in the next visit

## 2019-12-13 ENCOUNTER — Encounter: Payer: Self-pay | Admitting: Hematology and Oncology

## 2019-12-13 ENCOUNTER — Telehealth: Payer: Self-pay | Admitting: Hematology and Oncology

## 2019-12-13 NOTE — Progress Notes (Signed)
The patient was having neck and shoulder pain after completing Taxol.  She was given 650 mg of ibuprofen she had no other issues of concern.  [name], MHS, PA-C Physician Assistant

## 2019-12-13 NOTE — Telephone Encounter (Signed)
Scheduled appts per 5/4 los. Pt confirmed appt date and time.

## 2019-12-19 DIAGNOSIS — M159 Polyosteoarthritis, unspecified: Secondary | ICD-10-CM | POA: Diagnosis not present

## 2019-12-19 DIAGNOSIS — I10 Essential (primary) hypertension: Secondary | ICD-10-CM | POA: Diagnosis not present

## 2019-12-19 DIAGNOSIS — E1159 Type 2 diabetes mellitus with other circulatory complications: Secondary | ICD-10-CM | POA: Diagnosis not present

## 2019-12-19 DIAGNOSIS — E782 Mixed hyperlipidemia: Secondary | ICD-10-CM | POA: Diagnosis not present

## 2019-12-28 DIAGNOSIS — H2512 Age-related nuclear cataract, left eye: Secondary | ICD-10-CM | POA: Diagnosis not present

## 2020-01-01 ENCOUNTER — Ambulatory Visit (HOSPITAL_COMMUNITY)
Admission: RE | Admit: 2020-01-01 | Discharge: 2020-01-01 | Disposition: A | Discharge status: Home / Self Care | Payer: Medicare HMO | Source: Ambulatory Visit | Attending: Hematology and Oncology | Admitting: Hematology and Oncology

## 2020-01-01 ENCOUNTER — Other Ambulatory Visit: Payer: Self-pay

## 2020-01-01 ENCOUNTER — Encounter (HOSPITAL_COMMUNITY): Payer: Self-pay

## 2020-01-01 DIAGNOSIS — I700 Atherosclerosis of aorta: Secondary | ICD-10-CM | POA: Diagnosis not present

## 2020-01-01 DIAGNOSIS — C569 Malignant neoplasm of unspecified ovary: Secondary | ICD-10-CM | POA: Diagnosis not present

## 2020-01-01 DIAGNOSIS — C5700 Malignant neoplasm of unspecified fallopian tube: Secondary | ICD-10-CM

## 2020-01-01 DIAGNOSIS — C5701 Malignant neoplasm of right fallopian tube: Secondary | ICD-10-CM | POA: Diagnosis not present

## 2020-01-01 DIAGNOSIS — R937 Abnormal findings on diagnostic imaging of other parts of musculoskeletal system: Secondary | ICD-10-CM | POA: Diagnosis not present

## 2020-01-01 MED ORDER — SODIUM CHLORIDE (PF) 0.9 % IJ SOLN
INTRAMUSCULAR | Status: AC
  Filled 2020-01-01: qty 50

## 2020-01-01 MED ORDER — IOHEXOL 9 MG/ML PO SOLN
1000.0000 mL | ORAL | Status: AC
  Administered 2020-01-01: 1000 mL via ORAL

## 2020-01-01 MED ORDER — HEPARIN SOD (PORK) LOCK FLUSH 100 UNIT/ML IV SOLN
INTRAVENOUS | Status: AC
  Filled 2020-01-01: qty 5

## 2020-01-01 MED ORDER — HEPARIN SOD (PORK) LOCK FLUSH 100 UNIT/ML IV SOLN
500.0000 [IU] | Freq: Once | INTRAVENOUS | Status: AC
  Administered 2020-01-01: 500 [IU] via INTRAVENOUS

## 2020-01-01 MED ORDER — IOHEXOL 300 MG/ML  SOLN
100.0000 mL | Freq: Once | INTRAMUSCULAR | Status: AC | PRN
  Administered 2020-01-01: 100 mL via INTRAVENOUS

## 2020-01-02 ENCOUNTER — Telehealth: Payer: Self-pay | Admitting: Hematology and Oncology

## 2020-01-02 ENCOUNTER — Encounter: Payer: Self-pay | Admitting: Hematology and Oncology

## 2020-01-02 ENCOUNTER — Inpatient Hospital Stay (HOSPITAL_BASED_OUTPATIENT_CLINIC_OR_DEPARTMENT_OTHER): Payer: Medicare HMO | Admitting: Hematology and Oncology

## 2020-01-02 ENCOUNTER — Other Ambulatory Visit: Payer: Self-pay

## 2020-01-02 VITALS — BP 146/84 | HR 92 | Temp 98.9°F | Resp 18 | Ht 62.0 in | Wt 188.4 lb

## 2020-01-02 DIAGNOSIS — E119 Type 2 diabetes mellitus without complications: Secondary | ICD-10-CM | POA: Diagnosis not present

## 2020-01-02 DIAGNOSIS — I10 Essential (primary) hypertension: Secondary | ICD-10-CM | POA: Diagnosis not present

## 2020-01-02 DIAGNOSIS — Z7189 Other specified counseling: Secondary | ICD-10-CM | POA: Diagnosis not present

## 2020-01-02 DIAGNOSIS — G620 Drug-induced polyneuropathy: Secondary | ICD-10-CM | POA: Diagnosis not present

## 2020-01-02 DIAGNOSIS — Z791 Long term (current) use of non-steroidal anti-inflammatories (NSAID): Secondary | ICD-10-CM | POA: Diagnosis not present

## 2020-01-02 DIAGNOSIS — Z7952 Long term (current) use of systemic steroids: Secondary | ICD-10-CM | POA: Diagnosis not present

## 2020-01-02 DIAGNOSIS — C5701 Malignant neoplasm of right fallopian tube: Secondary | ICD-10-CM | POA: Diagnosis not present

## 2020-01-02 DIAGNOSIS — C5700 Malignant neoplasm of unspecified fallopian tube: Secondary | ICD-10-CM

## 2020-01-02 DIAGNOSIS — Z5111 Encounter for antineoplastic chemotherapy: Secondary | ICD-10-CM | POA: Diagnosis not present

## 2020-01-02 DIAGNOSIS — Z7984 Long term (current) use of oral hypoglycemic drugs: Secondary | ICD-10-CM | POA: Diagnosis not present

## 2020-01-02 DIAGNOSIS — D696 Thrombocytopenia, unspecified: Secondary | ICD-10-CM | POA: Diagnosis not present

## 2020-01-02 DIAGNOSIS — H269 Unspecified cataract: Secondary | ICD-10-CM | POA: Diagnosis not present

## 2020-01-02 NOTE — Assessment & Plan Note (Signed)
We discussed the role of maintenance treatment She is in agreement to proceed with bevacizumab next month

## 2020-01-02 NOTE — Progress Notes (Signed)
ON PATHWAY REGIMEN - Ovarian  No Change  Continue With Treatment as Ordered.     A cycle is every 21 days:     Paclitaxel      Carboplatin   **Always confirm dose/schedule in your pharmacy ordering system**  Patient Characteristics: Recurrent or Progressive Disease, Second Line Therapy, Platinum Sensitive and ? 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

## 2020-01-02 NOTE — Progress Notes (Signed)
DISCONTINUE ON PATHWAY REGIMEN - Ovarian     A cycle is every 21 days:     Paclitaxel      Carboplatin   **Always confirm dose/schedule in your pharmacy ordering system**  REASON: Other Reason PRIOR TREATMENT: [id number]: Carboplatin AUC=6 + Paclitaxel 175 mg/m2 q21 Days; Stop Treatment After 6 Cycles If Complete Response, Otherwise Continue Treatment Until Progression or Unacceptable Toxicity TREATMENT RESPONSE: Complete Response (CR)  START OFF PATHWAY REGIMEN - Ovarian   [id number]:Bevacizumab 15 mg/kg q21d:   A cycle is every 21 days:     Bevacizumab-xxxx   **Always confirm dose/schedule in your pharmacy ordering system**  Patient Characteristics: Recurrent or Progressive Disease, Maintenance Therapy Therapeutic Status: Recurrent or Progressive Disease BRCA Mutation Status: Absent Line of Therapy: Maintenance Therapy  Intent of Therapy: Non-Curative / Palliative Intent, Discussed with Patient

## 2020-01-02 NOTE — Assessment & Plan Note (Signed)
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 with cancer recurrence and hence I will not be following up on tumor marker We discussed the roles of maintenance bevacizumab We also discussed the risk and benefits of neuropathy versus bevacizumab and she elected for bevacizumab treatment Due to plan for upcoming cataract surgery, I recommend delaying the start date of treatment for at least more than a week after her second cataract surgery before we start her back on treatment I will tentatively schedule her to return on June 21 for cycle 1 of bevacizumab I instructed the patient to start checking her blood pressure twice a day at home for baseline blood pressure monitoring

## 2020-01-02 NOTE — Telephone Encounter (Signed)
Scheduled per 5/25 sch message. Pt aware of appts.  

## 2020-01-02 NOTE — Progress Notes (Signed)
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[facility]) 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 with cancer recurrence and hence I will not be following up on tumor marker We discussed the roles of maintenance bevacizumab We also discussed the risk and benefits of neuropathy versus bevacizumab and she elected for bevacizumab treatment Due to plan for upcoming cataract surgery, I recommend delaying the start date of treatment for at least more than a week after her second cataract surgery before we start her back on treatment I will tentatively schedule her to return on June 21 for cycle 1 of bevacizumab I instructed the patient to start checking her blood pressure twice a day at home for baseline blood pressure monitoring  Essential hypertension We discussed the importance of aggressive blood pressure monitoring while on bevacizumab She will start checking her blood pressure twice a day  Goals of care, counseling/discussion We discussed the role of maintenance treatment She is in agreement to proceed with bevacizumab next month   Orders Placed This Encounter  Procedures  . Total Protein, Urine dipstick    Standing Status:   Standing    Number of Occurrences:   20    Standing Expiration Date:   01/01/2021    All questions were answered. The patient knows to call the clinic with any problems, questions or concerns. The total time spent in the appointment was 30 minutes encounter with patients including review of chart and various tests results, discussions about plan of care and coordination of care plan   [name], MD 01/02/2020 10:19 AM  INTERVAL HISTORY: Please see below for problem oriented  charting. She returns for treatment and follow-up She had 1 cataract surgery done recently She has some changes in her vision since then She is doing well otherwise No significant worsening peripheral neuropathy since her last cycle of treatment  SUMMARY OF ONCOLOGIC HISTORY: Oncology History Overview Note  Oncologic Summary: 1. 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 Random CA 125 elevation January 2019  Question mesenteric nodules and anterior abdominal wall nodule 3. GeneDx Breast/Ovary Panel negative (including BRCA, MMR's, RAD51 etc) ? My[facility] BRACAnalysis  Negative for BRCA1/2 in tumor   Fallopian tube carcinoma ([place])  11/01/2012 Imaging   Ct abdomen 1.  Interval development of large mid abdominal mass highly concerning for right ovarian cancer.  There is mild omental nodularity on the left, and peritoneal disease cannot be completely excluded.  There is no ascites or other evidence of metastatic disease. 2.  Mild associated renal pelvocaliectasis bilaterally without obstruction.  Nonobstructing left renal calculus and a small right renal angiomyolipoma noted incidentally.   11/08/2012 Pathology Results   1. 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 - BENIGN FALLOPIAN TUBE WITH NONSPECIFIC CHRONIC INFLAMMATION. 2. Ovary and fallopian tube, left - BENIGN OVARY; NEGATIVE FOR ATYPIA OR MALIGNANCY. - BENIGN FALLOPIAN TUBE; NEGATIVE FOR ATYPIA OR MALIGNANCY. 3. Omentum, resection for tumor - HIGH  GRADE CARCINOMA, SEE COMMENT. 4. Appendix, Other than Incidental - FIBROUS OBLITERATION OF  APPENDICEAL TIP. - NEGATIVE FOR MALIGNANCY.   11/08/2012 Surgery   Surgery: Exploratory laparotomy, bilateral salpingo-oophorectomy, appendectomy, infacolic omentectomy, optimal debulking  Surgeons:  Paola A. Alycia Rossetti, MD; Lahoma Crocker, MD   Assistant: Caswell Corwin  Pathology: Bilateral fallopian tubes and ovaries to pathology. Appendix as well as omentum. 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  Operative findings: 25 cm right adnexal mass with smooth surface. Surgically absent uterus. Atrophic-appearing left ovary. Normal appearing appendix. Within the omentum there were centimeter nodules scattered throughout the omentum. The remainder of the surfaces were benign.   12/08/2012 Procedure   Impression:  Placement of a subcutaneous port device.  The catheter tip is in the lower SVC and ready to be used.    12/13/2012 - 03/28/2013 Chemotherapy   s/p 6 cycles of paclitaxel and carboplatin   12/13/2012 - 03/28/2013 Chemotherapy   The patient had 6 cycles of carboplatin and Taxol   03/24/2013 Imaging   US abdomen   04/24/2013 Imaging   CT abdomen Interval resection of the large right pelvic and lower abdominal mass lesion with apparent omentectomy.  No evidence for intraperitoneal free fluid on today's study.  No discernible peritoneal lesions.  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   09/07/2013 Genetic Testing   Patient has genetic testing done for BRCA1/2 panel Results revealed patient has no mutation(s):   07/09/2015 Imaging   CT abdomen 1. 12 mm obstructive calculus at the left ureteropelvic junction with moderate proximal hydronephrosis. 2. 2 small supraumbilical ventral hernias, one containing a short segment of the mid transverse colon and the other containing a short segment of the mid small bowel. There is no associated evidence to suggest bowel incarceration or obstruction at this  time. 3. Tiny locule of gas non dependently in the lumen of the urinary bladder. This is presumably iatrogenic related to recent catheterization for urinalysis. Alternatively, this could be seen in the setting of urinary tract infection with gas-forming organisms. Clinical correlation for history of recent catheterization is recommended. 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 5. Status post cholecystectomy. 6. Additional incidental findings, as above.    12/11/2016 Imaging   Ct abdomen 1. No evidence of metastatic ovarian cancer. 2. Recurrent subxiphoid ventral abdominal wall hernia containing transverse colon. No evidence of incarceration or obstruction. 3. Stable incidental findings in the liver and kidneys. No recurrent urinary tract calculus. 4. Progressive lower lumbar spondylosis. 5.  Aortic Atherosclerosis (ICD10-I70.0).    08/30/2017 Imaging   MRI thoracic spine 1. At T5-6 there is a small central disc protrusion contacting the ventral thoracic spinal cord. No central canal or foraminal stenosis. 2. At T9-10 there is a small right paracentral disc protrusion. 3.  No acute osseous injury of the thoracic spine. 4. No aggressive osseous lesion to suggest metastatic disease.   09/24/2017 Tumor Marker   Patient's tumor was tested for the following markers: CA-125 Results of the tumor marker test revealed 21.7   09/30/2017 Imaging   CT abdomen 1. New small clustered soft tissue nodules in the left lower quadrant in the sigmoid mesentery, largest 1.0 cm, which could represent recurrent peritoneal tumor implants. No ascites.  2. Midline high ventral abdominal wall hernia containing a portion of the transverse colon is mildly increased in size, and without bowel complication at this time. 3. Chronic findings  include: Aortic Atherosclerosis (ICD10-I70.0). Diffuse hepatic steatosis. 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 Small right renal  angiomyolipoma.   10/11/2017 PET scan   1. Nodules in the sigmoid mesentery are hypermetabolic and highly worrisome for metastatic disease. 2. Attic steatosis.    11/03/2017 Procedure   Successful CT-guided rectus abdominal muscle mass core biopsy. Path: - FOREIGN BODY GIANT CELL REACTION INVOLVING FIBROADIPOSE TISSUE AND SKELETAL MUSCLE. - NO EVIDENCE OF MALIGNANCY.   11/04/2017 Cancer Staging   Staging form: Fallopian Tube, AJCC 7th Edition - Clinical: FIGO Stage IIIC, calculated as Stage IV (rT3, N0, M1) - Signed by [name], MD on 08/09/2019   02/2018 Imaging   CT: 1. Continued increase in size small peritoneal nodules along the mesenteric border of the proximal sigmoid colon as well as along the serosal surface of the proximal sigmoid colon. 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 2. No evidence of distant disease. 3. Stable large ventral hernia.   05/2018 Imaging   PET: 1. Redemonstration of hypermetabolic nodules within the sigmoid mesocolon. Mild response to therapy relative to CT of 02/24/2018. Mixed response to therapy compared to the most recent PET of 10/11/2017. 2. Hypermetabolism within the right pelvic rectus musculature, increased since the prior PET.  Clinical service requested comparison to the 11/24/2017 CT. Index 10 mm nodule within the sigmoid mesocolon was similar to the 02/24/2018 CT, and as described on that exam, increased from 7 mm on 11/24/2017. More inferior nodule within the mesocolon measures 12 mm today on image 156/4 and 8 mm on 11/24/2017.   05/2018 Imaging   CT: IMPRESSION: 1. Since 02/24/2018, decreased size of peritoneal nodules centered in the sigmoid mesocolon. 2. No evidence of new or progressive disease. 3. Hepatic steatosis. 4. Subcentimeter right renal angiomyolipoma, similar. 5.  Aortic Atherosclerosis (ICD10-I70.0). 6. Ventral abdominal wall laxity containing transverse colon, similar.   08/2018  Imaging   CT: IMPRESSION: 1. Nodules within the sigmoid mesocolon have decreased in size compared to prior. No new peritoneal or omental nodularity. 2. No evidence local recurrence the pelvis. 3. Ventral hernia contains a segment of transverse colon. No change from prior.     06/19/2019 Imaging   1. No evidence of metastatic disease in the abdomen pelvis. 2. No peritoneal or omental metastasis identified.  No free fluid. 3. Postcholecystectomy and hysterectomy. Comparison exams are made available. Comparison CT 09/08/2018 and 05/27/2018. PET-CT 05/27/2018    There is a nodule within the proximal aspect of the sigmoid colon measuring 2.2 by 2.2 cm. In comparison to prior CTs and PET-CT there was a hypermetabolic nodule at this location on the PET-CT of 08/27/2017 and on the CT of 09/08/2018 there was a small residual nodule. At that time (09/08/2018) the nodule measured 1.4 by 1.3 cm. Therefore this nodule has increased in the interval and concerning for recurrence of a serosal implant. The previous described lymph nodes in the sigmoid mesocolon and more central mesentery are not increased in size and not pathologic by size criteria.    Concern for recurrence of serosal metastasis in the proximal sigmoid colon with a 2 cm enlarging lesion. Lesion extends into the lumen. No evidence of high-grade obstruction. Consider FDG PET scan and/or potential colonoscopy for evaluation.   06/29/2019 PET scan   1. Enlarging serosal implant within the proximal sigmoid colon has intense metabolic activity most consistent with malignancy. Lesion exhibits a somewhat indolent progression as present on PET-CT scan from 10/11/2017 and 05/27/2018. 2. Focal hypermetabolic  activity within the RIGHT rectus muscle just off midline is slightly decreased from comparison exam. This may be benign inflammation related to prior laparotomy, however malignancy not excluded.   07/26/2019 Procedure   She had colonoscopy which  showed multiple polyps.  6 polyps were removed from the cecum, measuring 3 to 6 mm in size.  One 12 mm polyp was removed from ascending colon and another 1 measures 7 mm.  One 5 mm polyp was removed from the transverse colon.  There is tumor noted in the sigmoid colon, approximately 35 cm from the anus which was biopsied.  The tumor appeared to be fungating, infiltrative and ulcerated but nonobstructive.  It encompassed approximately one third of the circumference of the lumen.   07/26/2019 Pathology Results   Multiple polyps came back tubular adenomas.  2 ascending polyps came back sessile serrated adenoma months.  Sigmoid colon biopsy confirmed metastatic high-grade serous carcinoma.  The morphology and immunophenotype are consistent with metastatic high-grade serous carcinoma from tubal/ovarian primary site.   08/17/2019 - 12/12/2019 Chemotherapy   The patient had carboplatin and taxol   10/23/2019 Imaging   1. Sigmoid lesion with diminished size/conspicuity difficult to measure on the previous exam. Adjacent lymph nodes and nodularity less than a cm, largest on coronal image 48 measuring 5 mm. 2. Right rectus muscle with some thickening with mildly convex margin seen posteriorly on image 72 of series 2. This could be due to postsurgical change, however, metastatic disease is not excluded. Attention on follow-up is suggested. 3. No evidence for new metastatic disease. 4. Small hiatal hernia. 5. 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   Aortic Atherosclerosis (ICD10-I70.0).   01/01/2020 Imaging   1. No signs of new disease. 2. Tiny lymph nodes in the area of concern in the LEFT lower quadrant z. 3. Added density and subtle contour irregularity involving the rectus muscles best seen on sagittal images, associated with site of prior surgical incision just to the RIGHT of midline (image 72, series 2 and image 58, series 5. Not significantly changed compared to prior studies. This measures  approximately 2.1 x 1 cm in the sagittal plane and is less well-defined in the axial plane. Potentially postoperative change. Attention on follow-up. 4. Aortic atherosclerosis.   Aortic Atherosclerosis (ICD10-I70.0).       01/29/2020 -  Chemotherapy   The patient had bevacizumab-bvzr (ZIRABEV) 1,300 mg in sodium chloride 0.9 % 100 mL chemo infusion, 15 mg/kg = 1,300 mg, Intravenous,  Once, 0 of 4 cycles  for chemotherapy treatment.      REVIEW OF SYSTEMS:   Constitutional: Denies fevers, chills or abnormal weight loss Ears, nose, mouth, throat, and face: Denies mucositis or sore throat Respiratory: Denies cough, dyspnea or wheezes Cardiovascular: Denies palpitation, chest discomfort or lower extremity swelling Gastrointestinal:  Denies nausea, heartburn or change in bowel habits Skin: Denies abnormal skin rashes Lymphatics: Denies new lymphadenopathy or easy bruising Neurological:Denies numbness, tingling or new weaknesses Behavioral/Psych: Mood is stable, no new changes  All other systems were reviewed with the patient and are negative.  I have reviewed the past medical history, past surgical history, social history and family history with the patient and they are unchanged from previous note.  ALLERGIES:  is allergic to [employer] tartrate]; bee venom; and cortisone.  MEDICATIONS:  Current Outpatient Medications  Medication Sig Dispense Refill  . Armodafinil 250 MG tablet Take 1 tablet (250 mg total) by mouth daily. 90 tablet 1  . Carboxymethylcellulose Sodium (EYE DROPS  OP) Apply 2 drops to eye daily as needed (dry eye).    . cholecalciferol (VITAMIN D3) 25 MCG (1000 UT) tablet Take 1,000 Units by mouth daily.    . colestipol (COLESTID) 1 g tablet Take 0.5-1 g by mouth daily. Pt taking 1/2 to 1 tablet daily depending on meal choice; taken 1 hour after morning meds , and can not eat for another hour    . dexamethasone (DECADRON) 4 MG tablet Take 2 tabs at the night before  and 2 tabs the morning of chemotherapy, every 3 weeks, by mouth x 6 cycles, please dispense 8 pills 8 tablet 6  . EPINEPHrine (EPIPEN 2-PAK) 0.3 mg/0.3 mL DEVI Inject 0.3 mg into the muscle once as needed (For bee stings.). Reported on 01/20/2016    . eszopiclone (LUNESTA) 2 MG TABS tablet TAKE ONE-HALF TO ONE TABLET BY MOUTH EVERY NIGHT AT BEDTIME AS NEEDED (Patient taking differently: Take 2 mg by mouth See admin instructions. TAKE  ONE TABLET BY MOUTH EVERY NIGHT AT BEDTIME) 90 tablet 0  . FLUoxetine (PROZAC) 10 MG capsule Take 10 mg by mouth every morning. Takes with a 51m capsule for her total dose of 359m    . Marland KitchenLUoxetine (PROZAC) 20 MG capsule Take 20 mg by mouth every morning. Takes with a 1064mapsule for her total dose of 65m51m  . ibMarland Kitchenprofen (ADVIL,MOTRIN) 200 MG tablet Take 400-600 mg by mouth every 6 (six) hours as needed for mild pain or moderate pain.    . liMarland Kitchenocaine-prilocaine (EMLA) cream Apply 1 application topically as needed. 30 g 1  . losartan-hydrochlorothiazide (HYZAAR) 100-12.5 MG tablet Take 1 tablet by mouth daily.     . MEMarland KitchenATONIN PO Take 10 mg by mouth at bedtime.     . metFORMIN (GLUCOPHAGE-XR) 500 MG 24 hr tablet Take 500 mg by mouth daily with breakfast.   3  . Multiple Vitamin (MULTIVITAMIN WITH MINERALS) TABS Take 1 tablet by mouth every morning.     . naproxen sodium (ALEVE) 220 MG tablet Take 220 mg by mouth daily as needed (pain).     . ondansetron (ZOFRAN) 8 MG tablet Take 1 tablet (8 mg total) by mouth every 8 (eight) hours as needed for refractory nausea / vomiting. Start on day 3 after carboplatin chemo. 30 tablet 1  . prochlorperazine (COMPAZINE) 10 MG tablet Take 1 tablet (10 mg total) by mouth every 6 (six) hours as needed (Nausea or vomiting). 30 tablet 1  . rosuvastatin (CRESTOR) 20 MG tablet Take 20 mg by mouth every morning.      No current facility-administered medications for this visit.   Facility-Administered Medications Ordered in Other Visits   Medication Dose Route Frequency Provider Last Rate Last Admin  . sodium chloride flush (NS) 0.9 % injection 10 mL  10 mL Intravenous PRN GehrNancy Marus   10 mL at 05/12/17 0823    PHYSICAL EXAMINATION: ECOG PERFORMANCE STATUS: 1 - Symptomatic but completely ambulatory  Vitals:   01/02/20 0823  BP: (!) 146/84  Pulse: 92  Resp: 18  Temp: 98.9 F (37.2 C)  SpO2: 100%   Filed Weights   01/02/20 0823  Weight: 188 lb 6.4 oz (85.5 kg)    GENERAL:alert, no distress and comfortable NEURO: alert & oriented x 3 with fluent speech, no focal motor/sensory deficits  LABORATORY DATA:  I have reviewed the data as listed    Component Value Date/Time   NA 139 12/12/2019 0920   NA 141 12/02/2016 1219  K 3.6 12/12/2019 0920   K 3.7 12/02/2016 1219   CL 103 12/12/2019 0920   CL 103 01/18/2013 1329   CO2 25 12/12/2019 0920   CO2 27 12/02/2016 1219   GLUCOSE 265 (H) 12/12/2019 0920   GLUCOSE 98 12/02/2016 1219   GLUCOSE 110 (H) 01/18/2013 1329   BUN 18 12/12/2019 0920   BUN 15.3 12/02/2016 1219   CREATININE 0.79 12/12/2019 0920   CREATININE 0.7 12/02/2016 1219   CALCIUM 9.8 12/12/2019 0920   CALCIUM 10.1 12/02/2016 1219   PROT 7.6 12/12/2019 0920   PROT 7.1 12/02/2016 1219   ALBUMIN 4.2 12/12/2019 0920   ALBUMIN 4.2 12/02/2016 1219   AST 20 12/12/2019 0920   AST 21 12/02/2016 1219   ALT 28 12/12/2019 0920   ALT 27 12/02/2016 1219   ALKPHOS 104 12/12/2019 0920   ALKPHOS 104 12/02/2016 1219   BILITOT 0.3 12/12/2019 0920   BILITOT 0.37 12/02/2016 1219   GFRNONAA >60 12/12/2019 0920   GFRAA >60 12/12/2019 0920    No results found for: SPEP, UPEP  Lab Results  Component Value Date   WBC 4.8 12/12/2019   NEUTROABS 4.1 12/12/2019   HGB 12.4 12/12/2019   HCT 37.5 12/12/2019   MCV 96.2 12/12/2019   PLT 110 (L) 12/12/2019      Chemistry      Component Value Date/Time   NA 139 12/12/2019 0920   NA 141 12/02/2016 1219   K 3.6 12/12/2019 0920   K 3.7 12/02/2016 1219    CL 103 12/12/2019 0920   CL 103 01/18/2013 1329   CO2 25 12/12/2019 0920   CO2 27 12/02/2016 1219   BUN 18 12/12/2019 0920   BUN 15.3 12/02/2016 1219   CREATININE 0.79 12/12/2019 0920   CREATININE 0.7 12/02/2016 1219   GLU 236 12/13/2012 1558      Component Value Date/Time   CALCIUM 9.8 12/12/2019 0920   CALCIUM 10.1 12/02/2016 1219   ALKPHOS 104 12/12/2019 0920   ALKPHOS 104 12/02/2016 1219   AST 20 12/12/2019 0920   AST 21 12/02/2016 1219   ALT 28 12/12/2019 0920   ALT 27 12/02/2016 1219   BILITOT 0.3 12/12/2019 0920   BILITOT 0.37 12/02/2016 1219       RADIOGRAPHIC STUDIES: I have reviewed multiple imaging studies with the patient I have personally reviewed the radiological images as listed and agreed with the findings in the report. CT ABDOMEN PELVIS W CONTRAST  Result Date: 01/01/2020 CLINICAL DATA:  Ovarian cancer, assess treatment response. EXAM: CT ABDOMEN AND PELVIS WITH CONTRAST TECHNIQUE: Multidetector CT imaging of the abdomen and pelvis was performed using the standard protocol following bolus administration of intravenous contrast. CONTRAST:  134m OMNIPAQUE IOHEXOL 300 MG/ML  SOLN COMPARISON:  10/23/2019 FINDINGS: Lower chest: Basilar atelectasis. Minimal calcification of the mitral valve annulus. Heart size upper limits of normal. Unchanged from previous imaging. Hepatobiliary: Liver without focal lesion. Post cholecystectomy. Mild biliary ductal distension unchanged likely post cholecystectomy baseline. Small area of calcification along the liver margin is unchanged just below RIGHT hemidiaphragm (image 11, series 2) 12 mm. Portal vein is patent. Pancreas: Pancreas is normal without focal lesion, ductal dilation or inflammation. Spleen: Spleen normal size without focal lesion. Adrenals/Urinary Tract: Adrenal glands are normal. Kidneys enhance symmetrically. Urinary bladder is unremarkable. Stomach/Bowel: No sign of bowel obstruction. No acute bowel process.  Vascular/Lymphatic: Calcified and noncalcified plaque in the abdominal aorta. No adenopathy in the upper abdomen or retroperitoneum. No pelvic lymphadenopathy. Reproductive: Post hysterectomy.  No pelvic adenopathy. Other: No ascites. No new nodules. Musculoskeletal: No acute bone finding. No destructive bone process. IMPRESSION: 1. No signs of new disease. 2. Tiny lymph nodes in the area of concern in the LEFT lower quadrant z. 3. Added density and subtle contour irregularity involving the rectus muscles best seen on sagittal images, associated with site of prior surgical incision just to the RIGHT of midline (image 72, series 2 and image 58, series 5. Not significantly changed compared to prior studies. This measures approximately 2.1 x 1 cm in the sagittal plane and is less well-defined in the axial plane. Potentially postoperative change. Attention on follow-up. 4. Aortic atherosclerosis. Aortic Atherosclerosis (ICD10-I70.0). Electronically Signed   By: Zetta Bills M.D.   On: 01/01/2020 09:36

## 2020-01-02 NOTE — Assessment & Plan Note (Signed)
We discussed the importance of aggressive blood pressure monitoring while on bevacizumab She will start checking her blood pressure twice a day

## 2020-01-14 DIAGNOSIS — H2511 Age-related nuclear cataract, right eye: Secondary | ICD-10-CM | POA: Diagnosis not present

## 2020-01-16 ENCOUNTER — Other Ambulatory Visit: Payer: Self-pay | Admitting: Neurology

## 2020-01-16 ENCOUNTER — Other Ambulatory Visit: Payer: Self-pay | Admitting: Diagnostic Neuroimaging

## 2020-01-16 DIAGNOSIS — F5102 Adjustment insomnia: Secondary | ICD-10-CM

## 2020-01-16 DIAGNOSIS — G4733 Obstructive sleep apnea (adult) (pediatric): Secondary | ICD-10-CM

## 2020-01-18 DIAGNOSIS — H2511 Age-related nuclear cataract, right eye: Secondary | ICD-10-CM | POA: Diagnosis not present

## 2020-01-19 DIAGNOSIS — M159 Polyosteoarthritis, unspecified: Secondary | ICD-10-CM | POA: Diagnosis not present

## 2020-01-19 DIAGNOSIS — E782 Mixed hyperlipidemia: Secondary | ICD-10-CM | POA: Diagnosis not present

## 2020-01-19 DIAGNOSIS — E1159 Type 2 diabetes mellitus with other circulatory complications: Secondary | ICD-10-CM | POA: Diagnosis not present

## 2020-01-19 DIAGNOSIS — I10 Essential (primary) hypertension: Secondary | ICD-10-CM | POA: Diagnosis not present

## 2020-01-23 NOTE — Progress Notes (Signed)
Pharmacist Chemotherapy Monitoring - Initial Assessment    Anticipated start date: 01/29/20  Regimen:  . Are orders appropriate based on the patient's diagnosis, regimen, and cycle? Yes . Does the plan date match the patient's scheduled date? Yes . Is the sequencing of drugs appropriate? Yes . Are the premedications appropriate for the patient's regimen? Yes . Prior Authorization for treatment is: Not Started o If applicable, is the correct biosimilar selected based on the patient's insurance? Yes - no pref; orders in for Zirabev  Organ Function and Labs: Marland Kitchen Are dose adjustments needed based on the patient's renal function, hepatic function, or hematologic function? No . Are appropriate labs ordered prior to the start of patient's treatment? Yes . Other organ system assessment, if indicated: bevacizumab: baseline BP . The following baseline labs, if indicated, have been ordered: N/A  Dose Assessment: . Are the drug doses appropriate? Yes . Are the following correct: o Drug concentrations Yes o IV fluid compatible with drug Yes o Administration routes Yes o Timing of therapy Yes . If applicable, does the patient have documented access for treatment and/or plans for port-a-cath placement? no . If applicable, have lifetime cumulative doses been properly documented and assessed? yes Lifetime Dose Tracking  . Carboplatin: 6,860 mg = 0.01 % of the maximum lifetime dose of 999,999,999 mg  o   Toxicity Monitoring/Prevention: . The patient has the following take home antiemetics prescribed: Ondansetron and Prochlorperazine . The patient has the following take home medications prescribed: N/A . Medication allergies and previous infusion related reactions, if applicable, have been reviewed and addressed. Yes . The patient's current medication list has been assessed for drug-drug interactions with their chemotherapy regimen. no significant drug-drug interactions were identified on  review.  Order Review: . Are the treatment plan orders signed? Yes . Is the patient scheduled to see a provider prior to their treatment? Yes   Confirm date of eye surgery.  Dr. Alvy Bimler recommend delaying the start date of treatment for at least more than a week after her second cataract surgery before we start her back on treatment.  I verify that I have reviewed each item in the above checklist and answered each question accordingly.   Kennith Center, Pharm.D., CPP 01/23/2020@2 :24 PM

## 2020-01-29 ENCOUNTER — Inpatient Hospital Stay (HOSPITAL_BASED_OUTPATIENT_CLINIC_OR_DEPARTMENT_OTHER): Payer: Medicare HMO | Admitting: Hematology and Oncology

## 2020-01-29 ENCOUNTER — Encounter: Payer: Self-pay | Admitting: Rehabilitation

## 2020-01-29 ENCOUNTER — Inpatient Hospital Stay: Payer: Medicare HMO

## 2020-01-29 ENCOUNTER — Encounter: Payer: Self-pay | Admitting: Hematology and Oncology

## 2020-01-29 ENCOUNTER — Inpatient Hospital Stay: Payer: Medicare HMO | Attending: Gynecologic Oncology

## 2020-01-29 ENCOUNTER — Other Ambulatory Visit: Payer: Self-pay

## 2020-01-29 VITALS — BP 142/80

## 2020-01-29 DIAGNOSIS — Z791 Long term (current) use of non-steroidal anti-inflammatories (NSAID): Secondary | ICD-10-CM | POA: Diagnosis not present

## 2020-01-29 DIAGNOSIS — Z7984 Long term (current) use of oral hypoglycemic drugs: Secondary | ICD-10-CM | POA: Insufficient documentation

## 2020-01-29 DIAGNOSIS — R0981 Nasal congestion: Secondary | ICD-10-CM | POA: Diagnosis not present

## 2020-01-29 DIAGNOSIS — C5700 Malignant neoplasm of unspecified fallopian tube: Secondary | ICD-10-CM | POA: Diagnosis not present

## 2020-01-29 DIAGNOSIS — D696 Thrombocytopenia, unspecified: Secondary | ICD-10-CM | POA: Diagnosis not present

## 2020-01-29 DIAGNOSIS — R2689 Other abnormalities of gait and mobility: Secondary | ICD-10-CM | POA: Insufficient documentation

## 2020-01-29 DIAGNOSIS — Z5112 Encounter for antineoplastic immunotherapy: Secondary | ICD-10-CM | POA: Insufficient documentation

## 2020-01-29 DIAGNOSIS — C5701 Malignant neoplasm of right fallopian tube: Secondary | ICD-10-CM

## 2020-01-29 DIAGNOSIS — Z7189 Other specified counseling: Secondary | ICD-10-CM

## 2020-01-29 DIAGNOSIS — Z79899 Other long term (current) drug therapy: Secondary | ICD-10-CM | POA: Insufficient documentation

## 2020-01-29 LAB — CBC WITH DIFFERENTIAL (CANCER CENTER ONLY)
Abs Immature Granulocytes: 0.01 10*3/uL (ref 0.00–0.07)
Basophils Absolute: 0 10*3/uL (ref 0.0–0.1)
Basophils Relative: 0 %
Eosinophils Absolute: 0.1 10*3/uL (ref 0.0–0.5)
Eosinophils Relative: 2 %
HCT: 38.4 % (ref 36.0–46.0)
Hemoglobin: 12.6 g/dL (ref 12.0–15.0)
Immature Granulocytes: 0 %
Lymphocytes Relative: 31 %
Lymphs Abs: 1.3 10*3/uL (ref 0.7–4.0)
MCH: 32.5 pg (ref 26.0–34.0)
MCHC: 32.8 g/dL (ref 30.0–36.0)
MCV: 99 fL (ref 80.0–100.0)
Monocytes Absolute: 0.5 10*3/uL (ref 0.1–1.0)
Monocytes Relative: 13 %
Neutro Abs: 2.1 10*3/uL (ref 1.7–7.7)
Neutrophils Relative %: 54 %
Platelet Count: 143 10*3/uL — ABNORMAL LOW (ref 150–400)
RBC: 3.88 MIL/uL (ref 3.87–5.11)
RDW: 13.1 % (ref 11.5–15.5)
WBC Count: 4 10*3/uL (ref 4.0–10.5)
nRBC: 0 % (ref 0.0–0.2)

## 2020-01-29 LAB — CMP (CANCER CENTER ONLY)
ALT: 28 U/L (ref 0–44)
AST: 23 U/L (ref 15–41)
Albumin: 4.2 g/dL (ref 3.5–5.0)
Alkaline Phosphatase: 89 U/L (ref 38–126)
Anion gap: 14 (ref 5–15)
BUN: 16 mg/dL (ref 8–23)
CO2: 25 mmol/L (ref 22–32)
Calcium: 10.5 mg/dL — ABNORMAL HIGH (ref 8.9–10.3)
Chloride: 102 mmol/L (ref 98–111)
Creatinine: 0.69 mg/dL (ref 0.44–1.00)
GFR, Est AFR Am: >60 mL/min (ref >60)
GFR, Estimated: >60 mL/min (ref >60)
Glucose, Bld: 102 mg/dL — ABNORMAL HIGH (ref 70–99)
Potassium: 3.5 mmol/L (ref 3.5–5.1)
Sodium: 141 mmol/L (ref 135–145)
Total Bilirubin: 0.5 mg/dL (ref 0.3–1.2)
Total Protein: 7.2 g/dL (ref 6.5–8.1)

## 2020-01-29 LAB — TOTAL PROTEIN, URINE DIPSTICK: Protein, ur: NEGATIVE mg/dL

## 2020-01-29 MED ORDER — HEPARIN SOD (PORK) LOCK FLUSH 100 UNIT/ML IV SOLN
500.0000 [IU] | Freq: Once | INTRAVENOUS | Status: AC | PRN
  Administered 2020-01-29: 500 [IU]
  Filled 2020-01-29: qty 5

## 2020-01-29 MED ORDER — SODIUM CHLORIDE 0.9% FLUSH
10.0000 mL | Freq: Once | INTRAVENOUS | Status: AC
  Administered 2020-01-29: 10 mL
  Filled 2020-01-29: qty 10

## 2020-01-29 MED ORDER — SODIUM CHLORIDE 0.9 % IV SOLN
Freq: Once | INTRAVENOUS | Status: AC
  Filled 2020-01-29: qty 250

## 2020-01-29 MED ORDER — SODIUM CHLORIDE 0.9% FLUSH
10.0000 mL | INTRAVENOUS | Status: DC | PRN
  Administered 2020-01-29: 10 mL
  Filled 2020-01-29: qty 10

## 2020-01-29 MED ORDER — SODIUM CHLORIDE 0.9 % IV SOLN
15.0000 mg/kg | Freq: Once | INTRAVENOUS | Status: AC
  Administered 2020-01-29: 1300 mg via INTRAVENOUS
  Filled 2020-01-29: qty 48

## 2020-01-29 NOTE — Progress Notes (Signed)
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[facility]) She is in agreement to proceed with bevacizumab for maintenance treatment I advised her to continue close monitoring of blood pressure twice a day along with her heart rate I will see her back in 3 weeks for further follow-up  Thrombocytopenia ([place]) Her thrombocytopenia is improving Observe only for now  Balance problems Her balance is likely affected by recent chemotherapy I recommend referral to physical therapy and rehab  Sinus congestion She has symptoms of sinus congestion likely secondary to allergies Recommend over-the-counter remedies She has no leukocytosis or fever and I do not believe she warrants antibiotics    Orders Placed This Encounter  Procedures  . Ambulatory referral to Physical Therapy    Referral Priority:   Routine    Referral Type:   Physical Medicine    Referral Reason:   Specialty Services Required    Requested Specialty:   Physical Therapy    Number of Visits Requested:   1    All questions were answered. The patient knows to call the clinic with any problems, questions or concerns. The total time spent in the appointment was 20 minutes encounter with patients including review of chart and various tests results, discussions about plan of care and coordination of care plan   [name], MD 01/29/2020 9:05 AM  INTERVAL HISTORY: Please see below for problem oriented charting. She returns to start maintenance treatment She has some sinus congestions and runny nose, no documented fevers She complained of gait imbalances but no falls  SUMMARY OF ONCOLOGIC HISTORY: Oncology History Overview Note  Oncologic Summary: 1. 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 Random CA 125 elevation January 2019  Question mesenteric nodules and anterior abdominal wall nodule 3. GeneDx Breast/Ovary Panel negative (including BRCA, MMR's, RAD51 etc) ? 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  Interval development of large mid abdominal mass highly concerning for right ovarian cancer.  There is mild omental nodularity on the left, and peritoneal disease cannot be completely excluded.  There is no ascites or other evidence of metastatic disease. 2.  Mild associated renal pelvocaliectasis bilaterally without obstruction.  Nonobstructing left renal calculus and a small right renal angiomyolipoma noted incidentally.   11/08/2012 Pathology Results   1. 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 - BENIGN FALLOPIAN TUBE WITH NONSPECIFIC CHRONIC INFLAMMATION. 2. Ovary and fallopian tube, left - BENIGN OVARY; NEGATIVE FOR ATYPIA OR MALIGNANCY. - BENIGN FALLOPIAN TUBE; NEGATIVE FOR ATYPIA OR MALIGNANCY. 3. Omentum, resection for tumor - HIGH GRADE CARCINOMA, SEE COMMENT. 4. Appendix, Other than Incidental - FIBROUS OBLITERATION OF APPENDICEAL TIP. - NEGATIVE FOR MALIGNANCY.   11/08/2012 Surgery   Surgery: Exploratory laparotomy, bilateral salpingo-oophorectomy, appendectomy, infacolic omentectomy, optimal debulking  Surgeons:  Paola A. Alycia Rossetti, MD; Lahoma Crocker, MD   Assistant: Caswell Corwin  Pathology: Bilateral fallopian tubes and ovaries to pathology. Appendix as well as omentum. Frozen section of  the right ovary revealed at least a mucinous low  malignant potential or borderline tumor of the ovary.  Operative findings: 25 cm right adnexal mass with smooth surface. Surgically absent uterus. Atrophic-appearing left ovary. Normal appearing appendix. Within the omentum there were centimeter nodules scattered throughout the omentum. The remainder of the surfaces were benign.   12/08/2012 Procedure   Impression:  Placement of a subcutaneous port device.  The catheter tip is in the lower SVC and ready to be used.    12/13/2012 - 03/28/2013 Chemotherapy   s/p 6 cycles of paclitaxel and carboplatin   12/13/2012 - 03/28/2013 Chemotherapy   The patient had 6 cycles of carboplatin and Taxol   03/24/2013 Imaging   US abdomen   04/24/2013 Imaging   CT abdomen Interval resection of the large right pelvic and lower abdominal mass lesion with apparent omentectomy.  No evidence for intraperitoneal free fluid on today's study.  No discernible peritoneal lesions.  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   09/07/2013 Genetic Testing   Patient has genetic testing done for BRCA1/2 panel Results revealed patient has no mutation(s):   07/09/2015 Imaging   CT abdomen 1. 12 mm obstructive calculus at the left ureteropelvic junction with moderate proximal hydronephrosis. 2. 2 small supraumbilical ventral hernias, one containing a short segment of the mid transverse colon and the other containing a short segment of the mid small bowel. There is no associated evidence to suggest bowel incarceration or obstruction at this time. 3. Tiny locule of gas non dependently in the lumen of the urinary bladder. This is presumably iatrogenic related to recent catheterization for urinalysis. Alternatively, this could be seen in the setting of urinary tract infection with gas-forming organisms. Clinical correlation for history of recent catheterization is recommended. 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 5. Status post cholecystectomy. 6.  Additional incidental findings, as above.    12/11/2016 Imaging   Ct abdomen 1. No evidence of metastatic ovarian cancer. 2. Recurrent subxiphoid ventral abdominal wall hernia containing transverse colon. No evidence of incarceration or obstruction. 3. Stable incidental findings in the liver and kidneys. No recurrent urinary tract calculus. 4. Progressive lower lumbar spondylosis. 5.  Aortic Atherosclerosis (ICD10-I70.0).    08/30/2017 Imaging   MRI thoracic spine 1. At T5-6 there is a small central disc protrusion contacting the ventral thoracic spinal cord. No central canal or foraminal stenosis. 2. At T9-10 there is a small right paracentral disc protrusion. 3.  No acute osseous injury of the thoracic spine. 4. No aggressive osseous lesion to suggest metastatic disease.   09/24/2017 Tumor Marker   Patient's tumor was tested for the following markers: CA-125 Results of the tumor marker test revealed 21.7   09/30/2017 Imaging   CT abdomen 1. New small clustered soft tissue nodules in the left lower quadrant in the sigmoid mesentery, largest 1.0 cm, which could represent recurrent peritoneal tumor implants. No ascites.  2. Midline high ventral abdominal wall hernia containing a portion of the transverse colon is mildly increased in size, and without bowel complication at this time. 3. Chronic findings include: Aortic Atherosclerosis (ICD10-I70.0). Diffuse hepatic steatosis. 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   10/11/2017 PET scan   1. Nodules in the sigmoid mesentery are hypermetabolic and highly worrisome for metastatic disease. 2. Attic steatosis.    11/03/2017 Procedure   Successful CT-guided rectus abdominal muscle mass core biopsy. Path: - FOREIGN BODY GIANT CELL REACTION INVOLVING  FIBROADIPOSE TISSUE AND SKELETAL MUSCLE. - NO EVIDENCE OF MALIGNANCY.   11/04/2017 Cancer Staging   Staging form: Fallopian Tube, AJCC 7th  Edition - Clinical: FIGO Stage IIIC, calculated as Stage IV (rT3, N0, M1) - Signed by [name], MD on 08/09/2019   02/2018 Imaging   CT: 1. Continued increase in size small peritoneal nodules along the mesenteric border of the proximal sigmoid colon as well as along the serosal surface of the proximal sigmoid colon. 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 2. No evidence of distant disease. 3. Stable large ventral hernia.   05/2018 Imaging   PET: 1. Redemonstration of hypermetabolic nodules within the sigmoid mesocolon. Mild response to therapy relative to CT of 02/24/2018. Mixed response to therapy compared to the most recent PET of 10/11/2017. 2. Hypermetabolism within the right pelvic rectus musculature, increased since the prior PET.  Clinical service requested comparison to the 11/24/2017 CT. Index 10 mm nodule within the sigmoid mesocolon was similar to the 02/24/2018 CT, and as described on that exam, increased from 7 mm on 11/24/2017. More inferior nodule within the mesocolon measures 12 mm today on image 156/4 and 8 mm on 11/24/2017.   05/2018 Imaging   CT: IMPRESSION: 1. Since 02/24/2018, decreased size of peritoneal nodules centered in the sigmoid mesocolon. 2. No evidence of new or progressive disease. 3. Hepatic steatosis. 4. Subcentimeter right renal angiomyolipoma, similar. 5.  Aortic Atherosclerosis (ICD10-I70.0). 6. Ventral abdominal wall laxity containing transverse colon, similar.   08/2018 Imaging   CT: IMPRESSION: 1. Nodules within the sigmoid mesocolon have decreased in size compared to prior. No new peritoneal or omental nodularity. 2. No evidence local recurrence the pelvis. 3. Ventral hernia contains a segment of transverse colon. No change from prior.     06/19/2019 Imaging   1. No evidence of metastatic disease in the abdomen pelvis. 2. No peritoneal or omental metastasis identified.  No free fluid. 3.  Postcholecystectomy and hysterectomy. Comparison exams are made available. Comparison CT 09/08/2018 and 05/27/2018. PET-CT 05/27/2018    There is a nodule within the proximal aspect of the sigmoid colon measuring 2.2 by 2.2 cm. In comparison to prior CTs and PET-CT there was a hypermetabolic nodule at this location on the PET-CT of 08/27/2017 and on the CT of 09/08/2018 there was a small residual nodule. At that time (09/08/2018) the nodule measured 1.4 by 1.3 cm. Therefore this nodule has increased in the interval and concerning for recurrence of a serosal implant. The previous described lymph nodes in the sigmoid mesocolon and more central mesentery are not increased in size and not pathologic by size criteria.    Concern for recurrence of serosal metastasis in the proximal sigmoid colon with a 2 cm enlarging lesion. Lesion extends into the lumen. No evidence of high-grade obstruction. Consider FDG PET scan and/or potential colonoscopy for evaluation.   06/29/2019 PET scan   1. Enlarging serosal implant within the proximal sigmoid colon has intense metabolic activity most consistent with malignancy. Lesion exhibits a somewhat indolent progression as present on PET-CT scan from 10/11/2017 and 05/27/2018. 2. Focal hypermetabolic activity within the RIGHT rectus muscle just off midline is slightly decreased from comparison exam. This may be benign inflammation related to prior laparotomy, however malignancy not excluded.   07/26/2019 Procedure   She had colonoscopy which showed multiple polyps.  6 polyps were removed from the cecum, measuring 3 to 6 mm in size.  One 12 mm polyp was removed from ascending colon and  another 1 measures 7 mm.  One 5 mm polyp was removed from the transverse colon.  There is tumor noted in the sigmoid colon, approximately 35 cm from the anus which was biopsied.  The tumor appeared to be fungating, infiltrative and ulcerated but nonobstructive.  It encompassed approximately  one third of the circumference of the lumen.   07/26/2019 Pathology Results   Multiple polyps came back tubular adenomas.  2 ascending polyps came back sessile serrated adenoma months.  Sigmoid colon biopsy confirmed metastatic high-grade serous carcinoma.  The morphology and immunophenotype are consistent with metastatic high-grade serous carcinoma from tubal/ovarian primary site.   08/17/2019 - 12/12/2019 Chemotherapy   The patient had carboplatin and taxol   10/23/2019 Imaging   1. Sigmoid lesion with diminished size/conspicuity difficult to measure on the previous exam. Adjacent lymph nodes and nodularity less than a cm, largest on coronal image 48 measuring 5 mm. 2. Right rectus muscle with some thickening with mildly convex margin seen posteriorly on image 72 of series 2. This could be due to postsurgical change, however, metastatic disease is not excluded. Attention on follow-up is suggested. 3. No evidence for new metastatic disease. 4. Small hiatal hernia. 5. 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   Aortic Atherosclerosis (ICD10-I70.0).   01/01/2020 Imaging   1. No signs of new disease. 2. Tiny lymph nodes in the area of concern in the LEFT lower quadrant z. 3. Added density and subtle contour irregularity involving the rectus muscles best seen on sagittal images, associated with site of prior surgical incision just to the RIGHT of midline (image 72, series 2 and image 58, series 5. Not significantly changed compared to prior studies. This measures approximately 2.1 x 1 cm in the sagittal plane and is less well-defined in the axial plane. Potentially postoperative change. Attention on follow-up. 4. Aortic atherosclerosis.   Aortic Atherosclerosis (ICD10-I70.0).       01/29/2020 -  Chemotherapy   The patient had bevacizumab-bvzr (ZIRABEV) 1,300 mg in sodium chloride 0.9 % 100 mL chemo infusion, 15 mg/kg = 1,300 mg, Intravenous,  Once, 0 of 4 cycles  for chemotherapy treatment.       REVIEW OF SYSTEMS:   Constitutional: Denies fevers, chills or abnormal weight loss Eyes: Denies blurriness of vision Ears, nose, mouth, throat, and face: Denies mucositis or sore throat Respiratory: Denies cough, dyspnea or wheezes Cardiovascular: Denies palpitation, chest discomfort or lower extremity swelling Gastrointestinal:  Denies nausea, heartburn or change in bowel habits Skin: Denies abnormal skin rashes Lymphatics: Denies new lymphadenopathy or easy bruising Neurological:Denies numbness, tingling or new weaknesses Behavioral/Psych: Mood is stable, no new changes  All other systems were reviewed with the patient and are negative.  I have reviewed the past medical history, past surgical history, social history and family history with the patient and they are unchanged from previous note.  ALLERGIES:  is allergic to [employer] tartrate], bee venom, and cortisone.  MEDICATIONS:  Current Outpatient Medications  Medication Sig Dispense Refill  . eszopiclone (LUNESTA) 2 MG TABS tablet TAKE ONE TABLET BY MOUTH EVERYDAY AT BEDTIME 90 tablet 0  . Armodafinil 250 MG tablet Take 1 tablet (250 mg total) by mouth daily. 90 tablet 1  . Carboxymethylcellulose Sodium (EYE DROPS OP) Apply 2 drops to eye daily as needed (dry eye).    . cholecalciferol (VITAMIN D3) 25 MCG (1000 UT) tablet Take 1,000 Units by mouth daily.    . colestipol (COLESTID) 1 g tablet Take 0.5-1 g by mouth  daily. Pt taking 1/2 to 1 tablet daily depending on meal choice; taken 1 hour after morning meds , and can not eat for another hour    . EPINEPHrine (EPIPEN 2-PAK) 0.3 mg/0.3 mL DEVI Inject 0.3 mg into the muscle once as needed (For bee stings.). Reported on 01/20/2016    . FLUoxetine (PROZAC) 10 MG capsule Take 10 mg by mouth every morning. Takes with a 71m capsule for her total dose of 331m    . Marland KitchenLUoxetine (PROZAC) 20 MG capsule Take 20 mg by mouth every morning. Takes with a 1042mapsule for her total dose  of 10m40m  . ibMarland Kitchenprofen (ADVIL,MOTRIN) 200 MG tablet Take 400-600 mg by mouth every 6 (six) hours as needed for mild pain or moderate pain.    . liMarland Kitchenocaine-prilocaine (EMLA) cream Apply 1 application topically as needed. 30 g 1  . losartan-hydrochlorothiazide (HYZAAR) 100-12.5 MG tablet Take 1 tablet by mouth daily.     . MEMarland KitchenATONIN PO Take 10 mg by mouth at bedtime.     . metFORMIN (GLUCOPHAGE-XR) 500 MG 24 hr tablet Take 500 mg by mouth daily with breakfast.   3  . Multiple Vitamin (MULTIVITAMIN WITH MINERALS) TABS Take 1 tablet by mouth every morning.     . naproxen sodium (ALEVE) 220 MG tablet Take 220 mg by mouth daily as needed (pain).     . ondansetron (ZOFRAN) 8 MG tablet Take 1 tablet (8 mg total) by mouth every 8 (eight) hours as needed for refractory nausea / vomiting. Start on day 3 after carboplatin chemo. 30 tablet 1  . prochlorperazine (COMPAZINE) 10 MG tablet Take 1 tablet (10 mg total) by mouth every 6 (six) hours as needed (Nausea or vomiting). 30 tablet 1  . rosuvastatin (CRESTOR) 20 MG tablet Take 20 mg by mouth every morning.      No current facility-administered medications for this visit.   Facility-Administered Medications Ordered in Other Visits  Medication Dose Route Frequency Provider Last Rate Last Admin  . sodium chloride flush (NS) 0.9 % injection 10 mL  10 mL Intravenous PRN GehrNancy Marus   10 mL at 05/12/17 0823    PHYSICAL EXAMINATION: ECOG PERFORMANCE STATUS: 1 - Symptomatic but completely ambulatory  Vitals:   01/29/20 0826  BP: (!) 138/91  Pulse: 88  Resp: 18  Temp: (!) 97.2 F (36.2 C)  SpO2: 100%   Filed Weights   01/29/20 0826  Weight: 189 lb (85.7 kg)    GENERAL:alert, no distress and comfortable NEURO: alert & oriented x 3 with fluent speech, no focal motor/sensory deficits  LABORATORY DATA:  I have reviewed the data as listed    Component Value Date/Time   NA 139 12/12/2019 0920   NA 141 12/02/2016 1219   K 3.6 12/12/2019 0920    K 3.7 12/02/2016 1219   CL 103 12/12/2019 0920   CL 103 01/18/2013 1329   CO2 25 12/12/2019 0920   CO2 27 12/02/2016 1219   GLUCOSE 265 (H) 12/12/2019 0920   GLUCOSE 98 12/02/2016 1219   GLUCOSE 110 (H) 01/18/2013 1329   BUN 18 12/12/2019 0920   BUN 15.3 12/02/2016 1219   CREATININE 0.79 12/12/2019 0920   CREATININE 0.7 12/02/2016 1219   CALCIUM 9.8 12/12/2019 0920   CALCIUM 10.1 12/02/2016 1219   PROT 7.6 12/12/2019 0920   PROT 7.1 12/02/2016 1219   ALBUMIN 4.2 12/12/2019 0920   ALBUMIN 4.2 12/02/2016 1219   AST 20 12/12/2019 0920   AST  21 12/02/2016 1219   ALT 28 12/12/2019 0920   ALT 27 12/02/2016 1219   ALKPHOS 104 12/12/2019 0920   ALKPHOS 104 12/02/2016 1219   BILITOT 0.3 12/12/2019 0920   BILITOT 0.37 12/02/2016 1219   GFRNONAA >60 12/12/2019 0920   GFRAA >60 12/12/2019 0920    No results found for: SPEP, UPEP  Lab Results  Component Value Date   WBC 4.0 01/29/2020   NEUTROABS 2.1 01/29/2020   HGB 12.6 01/29/2020   HCT 38.4 01/29/2020   MCV 99.0 01/29/2020   PLT 143 (L) 01/29/2020      Chemistry      Component Value Date/Time   NA 139 12/12/2019 0920   NA 141 12/02/2016 1219   K 3.6 12/12/2019 0920   K 3.7 12/02/2016 1219   CL 103 12/12/2019 0920   CL 103 01/18/2013 1329   CO2 25 12/12/2019 0920   CO2 27 12/02/2016 1219   BUN 18 12/12/2019 0920   BUN 15.3 12/02/2016 1219   CREATININE 0.79 12/12/2019 0920   CREATININE 0.7 12/02/2016 1219   GLU 236 12/13/2012 1558      Component Value Date/Time   CALCIUM 9.8 12/12/2019 0920   CALCIUM 10.1 12/02/2016 1219   ALKPHOS 104 12/12/2019 0920   ALKPHOS 104 12/02/2016 1219   AST 20 12/12/2019 0920   AST 21 12/02/2016 1219   ALT 28 12/12/2019 0920   ALT 27 12/02/2016 1219   BILITOT 0.3 12/12/2019 0920   BILITOT 0.37 12/02/2016 1219

## 2020-01-29 NOTE — Assessment & Plan Note (Signed)
She is in agreement to proceed with bevacizumab for maintenance treatment I advised her to continue close monitoring of blood pressure twice a day along with her heart rate I will see her back in 3 weeks for further follow-up

## 2020-01-29 NOTE — Assessment & Plan Note (Signed)
Her thrombocytopenia is improving Observe only for now

## 2020-01-29 NOTE — Assessment & Plan Note (Signed)
She has symptoms of sinus congestion likely secondary to allergies Recommend over-the-counter remedies She has no leukocytosis or fever and I do not believe she warrants antibiotics

## 2020-01-29 NOTE — Assessment & Plan Note (Signed)
Her balance is likely affected by recent chemotherapy I recommend referral to physical therapy and rehab

## 2020-01-29 NOTE — Patient Instructions (Signed)
[facility] Discharge Instructions for Patients Receiving Chemotherapy  Today you received the following immunotherapy agents bevacizumab  To help prevent nausea and vomiting after your treatment, we encourage you to take your nausea medication as prescribed.   If you develop nausea and vomiting that is not controlled by your nausea medication, call the clinic.   BELOW ARE SYMPTOMS THAT SHOULD BE REPORTED IMMEDIATELY:  *FEVER GREATER THAN 100.5 F  *CHILLS WITH OR WITHOUT FEVER  NAUSEA AND VOMITING THAT IS NOT CONTROLLED WITH YOUR NAUSEA MEDICATION  *UNUSUAL SHORTNESS OF BREATH  *UNUSUAL BRUISING OR BLEEDING  TENDERNESS IN MOUTH AND THROAT WITH OR WITHOUT PRESENCE OF ULCERS  *URINARY PROBLEMS  *BOWEL PROBLEMS  UNUSUAL RASH Items with * indicate a potential emergency and should be followed up as soon as possible.  Feel free to call the clinic should you have any questions or concerns. The clinic phone number is [phone].  Please show the [facility] at check-in to the Emergency Department and triage nurse.  Bevacizumab injection What is this medicine? BEVACIZUMAB (be va SIZ yoo mab) is a monoclonal antibody. It is used to treat many types of cancer. This medicine may be used for other purposes; ask your health care provider or pharmacist if you have questions. COMMON BRAND NAME(S): Avastin, MVASI, Zirabev What should I tell my health care provider before I take this medicine? They need to know if you have any of these conditions:  diabetes  heart disease  high blood pressure  history of coughing up blood  prior anthracycline chemotherapy (e.g., doxorubicin, daunorubicin, epirubicin)  recent or ongoing radiation therapy  recent or planning to have surgery  stroke  an unusual or allergic reaction to bevacizumab, hamster proteins, mouse proteins, other medicines, foods, dyes, or preservatives  pregnant or trying to get  pregnant  breast-feeding How should I use this medicine? This medicine is for infusion into a vein. It is given by a health care professional in a hospital or clinic setting. Talk to your pediatrician regarding the use of this medicine in children. Special care may be needed. Overdosage: If you think you have taken too much of this medicine contact a poison control center or emergency room at once. NOTE: This medicine is only for you. Do not share this medicine with others. What if I miss a dose? It is important not to miss your dose. Call your doctor or health care professional if you are unable to keep an appointment. What may interact with this medicine? Interactions are not expected. This list may not describe all possible interactions. Give your health care provider a list of all the medicines, herbs, non-prescription drugs, or dietary supplements you use. Also tell them if you smoke, drink alcohol, or use illegal drugs. Some items may interact with your medicine. What should I watch for while using this medicine? Your condition will be monitored carefully while you are receiving this medicine. You will need important blood work and urine testing done while you are taking this medicine. This medicine may increase your risk to bruise or bleed. Call your doctor or health care professional if you notice any unusual bleeding. Before having surgery, talk to your health care provider to make sure it is ok. This drug can increase the risk of poor healing of your surgical site or wound. You will need to stop this drug for 28 days before surgery. After surgery, wait at least 28 days before restarting this drug. Make sure the surgical  site or wound is healed enough before restarting this drug. Talk to your health care provider if questions. Do not become pregnant while taking this medicine or for 6 months after stopping it. Women should inform their doctor if they wish to become pregnant or think they  might be pregnant. There is a potential for serious side effects to an unborn child. Talk to your health care professional or pharmacist for more information. Do not breast-feed an infant while taking this medicine and for 6 months after the last dose. This medicine has caused ovarian failure in some women. This medicine may interfere with the ability to have a child. You should talk to your doctor or health care professional if you are concerned about your fertility. What side effects may I notice from receiving this medicine? Side effects that you should report to your doctor or health care professional as soon as possible:  allergic reactions like skin rash, itching or hives, swelling of the face, lips, or tongue  chest pain or chest tightness  chills  coughing up blood  high fever  seizures  severe constipation  signs and symptoms of bleeding such as bloody or black, tarry stools; red or dark-brown urine; spitting up blood or brown material that looks like coffee grounds; red spots on the skin; unusual bruising or bleeding from the eye, gums, or nose  signs and symptoms of a blood clot such as breathing problems; chest pain; severe, sudden headache; pain, swelling, warmth in the leg  signs and symptoms of a stroke like changes in vision; confusion; trouble speaking or understanding; severe headaches; sudden numbness or weakness of the face, arm or leg; trouble walking; dizziness; loss of balance or coordination  stomach pain  sweating  swelling of legs or ankles  vomiting  weight gain Side effects that usually do not require medical attention (report to your doctor or health care professional if they continue or are bothersome):  back pain  changes in taste  decreased appetite  dry skin  nausea  tiredness This list may not describe all possible side effects. Call your doctor for medical advice about side effects. You may report side effects to FDA at  1-800-FDA-1088. Where should I keep my medicine? This drug is given in a hospital or clinic and will not be stored at home. NOTE: This sheet is a summary. It may not cover all possible information. If you have questions about this medicine, talk to your doctor, pharmacist, or health care provider.  2020 Elsevier/Gold Standard (2019-05-24 10:50:46)

## 2020-01-30 DIAGNOSIS — G4733 Obstructive sleep apnea (adult) (pediatric): Secondary | ICD-10-CM | POA: Diagnosis not present

## 2020-01-31 ENCOUNTER — Other Ambulatory Visit: Payer: Self-pay

## 2020-01-31 ENCOUNTER — Ambulatory Visit: Payer: Medicare HMO | Attending: Hematology and Oncology | Admitting: Rehabilitation

## 2020-01-31 ENCOUNTER — Telehealth: Payer: Self-pay | Admitting: Family Medicine

## 2020-01-31 ENCOUNTER — Encounter: Payer: Self-pay | Admitting: Rehabilitation

## 2020-01-31 DIAGNOSIS — Z483 Aftercare following surgery for neoplasm: Secondary | ICD-10-CM | POA: Diagnosis not present

## 2020-01-31 DIAGNOSIS — R2689 Other abnormalities of gait and mobility: Secondary | ICD-10-CM | POA: Insufficient documentation

## 2020-01-31 NOTE — Therapy (Signed)
[place], [place], [address] Phone: [phone]   Fax:  [phone]  Physical Therapy Evaluation  Patient Details  Name: Kristin Webb MRN: [id number] Date of Birth: [birth year] Referring Provider (PT): Dr Alvy Bimler   Encounter Date: 01/31/2020   PT End of Session - 01/31/20 0854    Visit Number 1    Number of Visits 12    Date for PT Re-Evaluation 03/13/20    Authorization Type [employer] -  Needs Auth    PT Start Time 0802    PT Stop Time 0850    PT Time Calculation (min) 48 min    Activity Tolerance Patient tolerated treatment well    Behavior During Therapy [facility] for tasks assessed/performed           Past Medical History:  Diagnosis Date  . Burning mouth syndrome   . Chronic fatigue   . Constipation   . Diabetes ([place]) 05/09/2018  . Diarrhea    in the past after gallbladder removal  . Fibromyalgia   . GERD (gastroesophageal reflux disease)   . History of kidney stones 07/2015  . Hyperlipidemia   . Hypertension    borderline not on meds   . Insomnia secondary to depression with anxiety   . Lumbar disc disease   . Ovarian cancer ([facility]) 2014/2020   met nodule in abd  . Pelvic mass in female   . Pneumonia    hx of pneumonia as an infant  . PONV (postoperative nausea and vomiting)    pain from gas hernia 2017  . Shortness of breath    with exertion   . Sleep apnea    uses CPAP  . Yeast infection     Past Surgical History:  Procedure Laterality Date  . ABDOMINAL HYSTERECTOMY     early 72s  . APPENDECTOMY     WITH DEBULKING/BSO  . CHOLECYSTECTOMY     early 1s  . CYSTOSCOPY W/ URETERAL STENT PLACEMENT Left 07/09/2015   DUE TO NEPHROLITHIASIS Procedure: CYSTOSCOPY WITH LEFT RETROGRADE PYELOGRAM/ LEFT URETERAL STENT PLACEMENT;  Surgeon: Ardis Hughs, MD;  Location: [facility];  Service: Urology;  Laterality: Left;  . HERNIA REPAIR    . INCISIONAL HERNIA REPAIR N/A 12/25/2015    WITH MESH Procedure:  INCISIONAL HERNIA REPAIR;  Surgeon: Ralene Ok, MD;  Location: [facility];  Service: General;  Laterality: N/A;  . INCISIONAL HERNIA REPAIR N/A 10/21/2018   Procedure: LAPAROSCOPIC INCISIONAL HERNIA REPAIR WITH MESH;  Surgeon: Ralene Ok, MD;  Location: [facility];  Service: General;  Laterality: N/A;  . INSERTION OF MESH N/A 12/25/2015   Procedure: INSERTION OF MESH;  Surgeon: Ralene Ok, MD;  Location: [facility];  Service: General;  Laterality: N/A;  . LAPAROTOMY Bilateral 11/08/2012   Procedure: EXPLORATORY LAPAROTOMY BILATERAL SALPINGO OOPHORECTOMY TUMOR DEBULKING ;  Surgeon: Imagene Gurney A. Alycia Rossetti, MD;  Location: [facility];  Service: Gynecology;  Laterality: Bilateral;  APPENDECTOMY / OMENTECTOMY  . LAPAROTOMY N/A 12/25/2015   Procedure: EXPLORATORY LAPAROTOMY;  Surgeon: Ralene Ok, MD;  Location: [facility];  Service: General;  Laterality: N/A;  . LYSIS OF ADHESION N/A 12/25/2015   Procedure: LYSIS OF ADHESION;  Surgeon: Ralene Ok, MD;  Location: [facility];  Service: General;  Laterality: N/A;  . PARTIAL HYSTERECTOMY  2006  . TRIGGER FINGER RELEASE      There were no vitals filed for this visit.    Subjective Assessment - 01/31/20 0805    Subjective My balance is just  not right.  My fatigue makes me feel horrible.    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  Recurrence found in 2019 with new nodules in the left lower quadrant and colon.  Just completed 6 sessions of chemotherapy and now on maintenance infusions every 3 weeks.  Fibromyalgia, Lumbar DDD, DM    How long can you walk comfortably? If I get too fatigued I have to stop walking    Patient Stated Goals I like to stop bumping in to things.    Currently in Pain? Yes    Pain Score 5     Pain Location --   intermittent joint and muscles aches from fibro and chemo   Pain Descriptors / Indicators Aching    Pain Type Chronic pain     Pain Onset More than a month ago    Pain Frequency Constant    Aggravating Factors  doing things I should not    Effect of Pain on Daily Activities reports not significant              [facility] PT Assessment - 01/31/20 0001      Assessment   Medical Diagnosis Fallopian Tube Carcinoma    Referring Provider (PT) Dr Alvy Bimler    Onset Date/Surgical Date 08/10/12    Hand Dominance Right      Precautions   Precaution Comments none      Restrictions   Weight Bearing Restrictions No      Balance Screen   Has the patient fallen in the past 6 months No    Has the patient had a decrease in activity level because of a fear of falling?  No    Is the patient reluctant to leave their home because of a fear of falling?  No      Home Ecologist residence    Grants One level    Additional Comments Just 2 stairs to enter      Prior Function   Level of Tees Toh Retired    Leisure yoga, tai chi, walking in the pool       Cognition   Overall Cognitive Status Within Functional Limits for tasks assessed      Sensation   Additional Comments eyes closed sensation intact       Coordination   Gross Motor Movements are Fluid and Coordinated Yes      Posture/Postural Control   Posture/Postural Control Postural limitations    Postural Limitations Rounded Shoulders;Forward head      ROM / Strength   AROM / PROM / Strength Strength      Strength   Overall Strength Comments LEs WNL and no pain equal bilaterally     Strength Assessment Site Hand    Right/Left hand Right;Left    Right Hand Grip (lbs) 35    Left Hand Grip (lbs) 32      Transfers   Five time sit to stand comments  12.89 seconds       Standardized Balance Assessment   Standardized Balance Assessment Berg Balance Test      Berg Balance Test   Sit to Stand Able to stand without using hands and stabilize independently    Standing  Unsupported Able to stand safely 2 minutes    Sitting with Back Unsupported but Feet Supported on Floor or Stool Able to  sit safely and securely 2 minutes    Stand to Sit Sits safely with minimal use of hands    Transfers Able to transfer safely, minor use of hands    Standing Unsupported with Eyes Closed Able to stand 10 seconds safely    Standing Unsupported with Feet Together Able to place feet together independently and stand 1 minute safely    From Standing, Reach Forward with Outstretched Arm Can reach confidently >25 cm (10")    From Standing Position, Pick up Object from Floor Able to pick up shoe safely and easily   doesn't like to do for back   From Standing Position, Turn to Look Behind Over each Shoulder Looks behind from both sides and weight shifts well    Turn 360 Degrees Able to turn 360 degrees safely in 4 seconds or less    Standing Unsupported, Alternately Place Feet on Step/Stool Able to stand independently and safely and complete 8 steps in 20 seconds    Standing Unsupported, One Foot in Front Able to place foot tandem independently and hold 30 seconds    Standing on One Leg Able to lift leg independently and hold equal to or more than 3 seconds   R: 15second,  L: 4   Total Score 54      High Level Balance   High Level Balance Comments [place] ABS: 26/40                      Objective measurements completed on examination: See above findings.                    PT Long Term Goals - 01/31/20 0859      PT LONG TERM GOAL #1   Title Pt will improve [place] ABS to 30/40    Baseline 26    Time 6    Period Weeks    Status New      PT LONG TERM GOAL #2   Title Pt will demonstrate single leg stance time of 10 seconds on the left LE    Baseline 4 seconds    Time 6    Period Weeks    Status New      PT LONG TERM GOAL #3   Title Pt will report no worsening of fatigue by the end of the day    Time 6    Period Weeks    Status New        PT LONG TERM GOAL #4   Title Pt will be ind with final HEP    Time 6    Period Weeks    Status New                  Plan - 01/31/20 4627    Clinical Impression Statement Pt presents with decreased fatigue, decreased balance, and some dizziness post 2nd bout of chemotherapy due to 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  pt has good LE and foot sensation, excellent general strength, excellent static balance unless on unstable surface, tired, or fatigued, and fair to good dynamic balance with most LOB walking with head turns.  Pt has decreased acceptance on the Lt LE as well as single leg balance of 4 seconds compared to 15 seconds on the other side.  Pt will benefit from PT at this time to prevent worsening of balance and fatigue during maintenance chemotherapy sessions.    Personal Factors  and Comorbidities Age;Fitness;Comorbidity 1    Comorbidities chemotherapy    Examination-Activity Limitations Locomotion Level    Examination-Participation Restrictions Yard Work;Community Activity    Stability/Clinical Decision Making Evolving/Moderate complexity    Clinical Decision Making Moderate    Rehab Potential Good    PT Frequency 2x / week    PT Duration 6 weeks    PT Treatment/Interventions ADLs/Self Care Home Management;Gait training;Therapeutic exercise;Patient/family education;Manual techniques;Neuromuscular re-education    PT Next Visit Plan begin nustep, balance to include foam work, single leg and left LE acceptance activities, and activities when walking    PT Home Exercise Plan walking program    Consulted and Agree with Plan of Care Patient           Patient will benefit from skilled therapeutic intervention in order to improve the following deficits and impairments:  Decreased activity tolerance, Difficulty walking  Visit Diagnosis: Other abnormalities of gait and mobility  Aftercare following surgery for neoplasm     Problem  List Patient Active Problem List   Diagnosis Date Noted  . Balance problems 01/29/2020  . Sinus congestion 01/29/2020  . Bilateral cataracts 12/12/2019  . UTI (urinary tract infection) 11/22/2019  . Dysuria 11/20/2019  . 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 Thrombocytopenia ([place] [facility]) 09/08/2019  . Fibromyalgia 09/08/2019  . Goals of care, counseling/discussion 08/08/2019  . Excessive body weight gain 05/09/2018  . Neoplastic malignant related fatigue 05/09/2018  . OSA on CPAP 05/09/2018  . Newly diagnosed diabetes ([place]) 05/09/2018  . Hypercalcemia 07/21/2016  . Other fatigue 07/15/2016  . Sicca syndrome, unspecified ([place]) 07/15/2016  . Excessive daytime sleepiness 05/18/2016  . Abdominal wall hernia 05/18/2016  . Activity intolerance related to fatigue 05/18/2016  . S/P hernia repair 12/25/2015  . BRCA negative 09/25/2015  . Pyelonephritis 07/09/2015  . Nephrolithiasis 07/09/2015  . Insomnia secondary to depression with anxiety 12/24/2014  . Gastritis 12/20/2014  . Port catheter in place 12/20/2014  . Insomnia 12/12/2013  . Obesity (BMI 35.0-39.9 without comorbidity) 12/12/2013  . OSA (obstructive sleep apnea) 11/24/2013  . Burning mouth syndrome 11/24/2013  . Ventral hernia 04/18/2013  . Essential hypertension 04/18/2013  . Fallopian tube carcinoma [facility]) 12/03/2012    Shan Levans, PT 01/31/2020, 9:01 AM  [place], [place], [address] Phone: [phone]   Fax:  [phone]  Name: Tosca Pletz MRN: [id number] Date of Birth: Dec 12, 1945

## 2020-01-31 NOTE — Telephone Encounter (Signed)
Pt called and LVM wanting to speak to RN to discuss the diagnosis that is showing on her mychart. She states that she does not have these diagnosis and is confused. Please advise.

## 2020-02-01 ENCOUNTER — Ambulatory Visit: Payer: Medicare HMO

## 2020-02-01 DIAGNOSIS — Z483 Aftercare following surgery for neoplasm: Secondary | ICD-10-CM | POA: Diagnosis not present

## 2020-02-01 DIAGNOSIS — R2689 Other abnormalities of gait and mobility: Secondary | ICD-10-CM

## 2020-02-01 NOTE — Therapy (Signed)
[place], [place], [address] Phone: [phone]   Fax:  [phone]  Physical Therapy Treatment  Patient Details  Name: Kristin Webb MRN: [id number] Date of Birth: [birth year] Referring Provider (PT): Dr Alvy Bimler   Encounter Date: 02/01/2020   PT End of Session - 02/01/20 1300    Visit Number 2    Number of Visits 12    Date for PT Re-Evaluation 03/13/20    Authorization Type [employer] -  Needs Auth    PT Start Time 0805    PT Stop Time 0902    PT Time Calculation (min) 57 min    Activity Tolerance Patient tolerated treatment well    Behavior During Therapy [facility] for tasks assessed/performed           Past Medical History:  Diagnosis Date  . Burning mouth syndrome   . Chronic fatigue   . Constipation   . Diabetes ([place]) 05/09/2018  . Diarrhea    in the past after gallbladder removal  . Fibromyalgia   . GERD (gastroesophageal reflux disease)   . History of kidney stones 07/2015  . Hyperlipidemia   . Hypertension    borderline not on meds   . Insomnia secondary to depression with anxiety   . Lumbar disc disease   . Ovarian cancer ([place]) 2014/2020   met nodule in abd  . Pelvic mass in female   . Pneumonia    hx of pneumonia as an infant  . PONV (postoperative nausea and vomiting)    pain from gas hernia 2017  . Shortness of breath    with exertion   . Sleep apnea    uses CPAP  . Yeast infection     Past Surgical History:  Procedure Laterality Date  . ABDOMINAL HYSTERECTOMY     early 65s  . APPENDECTOMY     WITH DEBULKING/BSO  . CHOLECYSTECTOMY     early 31s  . CYSTOSCOPY W/ URETERAL STENT PLACEMENT Left 07/09/2015   DUE TO NEPHROLITHIASIS Procedure: CYSTOSCOPY WITH LEFT RETROGRADE PYELOGRAM/ LEFT URETERAL STENT PLACEMENT;  Surgeon: Ardis Hughs, MD;  [facility];  Service: Urology;  Laterality: Left;  . HERNIA REPAIR    . INCISIONAL HERNIA REPAIR N/A 12/25/2015    WITH MESH Procedure:  INCISIONAL HERNIA REPAIR;  Surgeon: Ralene Ok, MD;  [facility];  Service: General;  Laterality: N/A;  . INCISIONAL HERNIA REPAIR N/A 10/21/2018   Procedure: LAPAROSCOPIC INCISIONAL HERNIA REPAIR WITH MESH;  Surgeon: Ralene Ok, MD;  Location: [place];  Service: General;  Laterality: N/A;  . INSERTION OF MESH N/A 12/25/2015   Procedure: INSERTION OF MESH;  Surgeon: Ralene Ok, MD;  [facility];  Service: General;  Laterality: N/A;  . LAPAROTOMY Bilateral 11/08/2012   Procedure: EXPLORATORY LAPAROTOMY BILATERAL SALPINGO OOPHORECTOMY TUMOR DEBULKING ;  Surgeon: Imagene Gurney A. Alycia Rossetti, MD;  [facility];  Service: Gynecology;  Laterality: Bilateral;  APPENDECTOMY / OMENTECTOMY  . LAPAROTOMY N/A 12/25/2015   Procedure: EXPLORATORY LAPAROTOMY;  Surgeon: Ralene Ok, MD;  [facility];  Service: General;  Laterality: N/A;  . LYSIS OF ADHESION N/A 12/25/2015   Procedure: LYSIS OF ADHESION;  Surgeon: Ralene Ok, MD;  [facility];  Service: General;  Laterality: N/A;  . PARTIAL HYSTERECTOMY  2006  . TRIGGER FINGER RELEASE      There were no vitals filed for this visit.   Subjective Assessment - 02/01/20 0813    Subjective I saw in my AVS  yesterday it said "My fatigue makes me feel horrible." And I didn't say that, my balance and dizziness are my biggest problems.    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  Recurrence found in 2019 with new nodules in the left lower quadrant and colon.  Just completed 6 sessions of chemotherapy and now on maintenance infusions every 3 weeks.  Fibromyalgia, Lumbar DDD, DM    Patient Stated Goals I'd like to stop bumping in to things.    Currently in Pain? Yes    Pain Score 6     Pain Location Back    Pain Orientation Lower    Pain Descriptors / Indicators Sharp    Pain Type Chronic pain    Pain Onset More than a month ago     Pain Frequency Constant    Aggravating Factors  bending over wrong    Pain Relieving Factors good body mechanics help (squatting to lift items from floor)                             [facility] Adult PT Treatment/Exercise - 02/01/20 0001      Neuro Re-ed    Neuro Re-ed Details  In // bars: Front (4x) and retro (2x) tandem walking with fingertips on bars;  grapevine to Rt and Lt 2x each with VCs throughout for each foot placement (pt struggles with coordination so required VCs with foot placement), some discomfort at knees reported but pt reports tolerable and this did not progress to pain; slow, controlled high knee marching 4x, SLS on [place] for bil hip 3 way raises x10 each with pt returning therapist demo and consistent cuing throughout for technique and slow, controlled pace; then finished with another attempt of front tandem walking with eyes closed 2x, and eyes open 1x keeping fingers on bars througout, pt reports increased dizziness by end of session with increased fatigue      Knee/Hip Exercises: Stretches   Passive Hamstring Stretch Right;Left;1 rep;10 seconds   seated edge of chair   Passive Hamstring Stretch Limitations pt with minimal stretch so stopped    Piriformis Stretch Right;Left;1 rep;20 seconds   seated edge of chair for figure 4 stretch     Knee/Hip Exercises: Aerobic   Nustep Level 4, x 10 mins with therapist monitoring pt througout      Knee/Hip Exercises: Standing   Forward Step Up Right;Left;10 reps;Step Height: 6";Hand Hold: 2   fingertips on // bars                      PT Long Term Goals - 01/31/20 0859      PT LONG TERM GOAL #1   Title Pt will improve [place] ABS to 30/40    Baseline 26    Time 6    Period Weeks    Status New      PT LONG TERM GOAL #2   Title Pt will demonstrate single leg stance time of 10 seconds on the left LE    Baseline 4 seconds    Time 6    Period Weeks    Status New      PT LONG TERM GOAL  #3   Title Pt will report no worsening of fatigue by the end of the day    Time 6    Period Weeks    Status New  PT LONG TERM GOAL #4   Title Pt will be ind with final HEP    Time 6    Period Weeks    Status New                 Plan - 02/01/20 1300    Clinical Impression Statement First session of treatment today which pt tolerated very well. First trial of NuStep and pt was able to perform 10 mins of this at level 4 with mild SOB/exertion noted by end. Also had pt perform multiple high level dynamic and static balance activities which she seemed to be challenged by but did well. Some discomfort repoted in knees with grapevine acitivity but no pain and this diminished quickly. She did report some increased dizziness at end of session but also reports this has been normal for her recently with increased fatigue, which she felt by end of session. Hopefully this will begin to decrease as her strength and endurance improves.    Personal Factors and Comorbidities Age;Fitness;Comorbidity 1    Comorbidities chemotherapy    Examination-Activity Limitations Locomotion Level    Examination-Participation Restrictions Yard Work;Community Activity    Stability/Clinical Decision Making Evolving/Moderate complexity    Rehab Potential Good    PT Frequency 2x / week    PT Treatment/Interventions ADLs/Self Care Home Management;Gait training;Therapeutic exercise;Patient/family education;Manual techniques;Neuromuscular re-education    PT Next Visit Plan Cont nustep and balance to include foam work, single leg and left LE acceptance activities, and activities when walking, include head turns    PT Home Exercise Plan walking program    Consulted and Agree with Plan of Care Patient           Patient will benefit from skilled therapeutic intervention in order to improve the following deficits and impairments:  Decreased activity tolerance, Difficulty walking  Visit Diagnosis: Other  abnormalities of gait and mobility  Aftercare following surgery for neoplasm     Problem List Patient Active Problem List   Diagnosis Date Noted  . Balance problems 01/29/2020  . Sinus congestion 01/29/2020  . Bilateral cataracts 12/12/2019  . UTI (urinary tract infection) 11/22/2019  . Dysuria 11/20/2019  . 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 Thrombocytopenia ([place]) 09/08/2019  . Fibromyalgia 09/08/2019  . Goals of care, counseling/discussion 08/08/2019  . Excessive body weight gain 05/09/2018  . Neoplastic malignant related fatigue 05/09/2018  . OSA on CPAP 05/09/2018  . Newly diagnosed diabetes ([place]) 05/09/2018  . Hypercalcemia 07/21/2016  . Other fatigue 07/15/2016  . Sicca syndrome, unspecified ([place]) 07/15/2016  . Excessive daytime sleepiness 05/18/2016  . Abdominal wall hernia 05/18/2016  . Activity intolerance related to fatigue 05/18/2016  . S/P hernia repair 12/25/2015  . BRCA negative 09/25/2015  . Pyelonephritis 07/09/2015  . Nephrolithiasis 07/09/2015  . Insomnia secondary to depression with anxiety 12/24/2014  . Gastritis 12/20/2014  . Port catheter in place 12/20/2014  . Insomnia 12/12/2013  . Obesity (BMI 35.0-39.9 without comorbidity) 12/12/2013  . OSA (obstructive sleep apnea) 11/24/2013  . Burning mouth syndrome 11/24/2013  . Ventral hernia 04/18/2013  . Essential hypertension 04/18/2013  . 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

## 2020-02-06 ENCOUNTER — Encounter: Payer: Medicare HMO | Admitting: Rehabilitation

## 2020-02-08 ENCOUNTER — Other Ambulatory Visit: Payer: Self-pay

## 2020-02-08 ENCOUNTER — Ambulatory Visit: Payer: Medicare HMO | Attending: Hematology and Oncology | Admitting: Physical Therapy

## 2020-02-08 DIAGNOSIS — R2689 Other abnormalities of gait and mobility: Secondary | ICD-10-CM

## 2020-02-08 DIAGNOSIS — Z483 Aftercare following surgery for neoplasm: Secondary | ICD-10-CM | POA: Diagnosis not present

## 2020-02-08 NOTE — Therapy (Signed)
[place], [place], [address] Phone: [phone]   Fax:  [phone]  Physical Therapy Treatment  Patient Details  Name: Kristin Webb MRN: [id number] Date of Birth: Feb 08, 1946 Referring Provider (PT): Dr Alvy Bimler   Encounter Date: 02/08/2020   PT End of Session - 02/08/20 1002    Visit Number 3    Number of Visits 12    Date for PT Re-Evaluation 03/13/20    PT Start Time 0805    PT Stop Time 0904    PT Time Calculation (min) 59 min    Activity Tolerance Patient tolerated treatment well    Behavior During Therapy [facility] for tasks assessed/performed           Past Medical History:  Diagnosis Date  . Burning mouth syndrome   . Chronic fatigue   . Constipation   . Diabetes ([place]) 05/09/2018  . Diarrhea    in the past after gallbladder removal  . Fibromyalgia   . GERD (gastroesophageal reflux disease)   . History of kidney stones 07/2015  . Hyperlipidemia   . Hypertension    borderline not on meds   . Insomnia secondary to depression with anxiety   . Lumbar disc disease   . Ovarian cancer ([place]) 2014/2020   met nodule in abd  . Pelvic mass in female   . Pneumonia    hx of pneumonia as an infant  . PONV (postoperative nausea and vomiting)    pain from gas hernia 2017  . Shortness of breath    with exertion   . Sleep apnea    uses CPAP  . Yeast infection     Past Surgical History:  Procedure Laterality Date  . ABDOMINAL HYSTERECTOMY     early 65s  . APPENDECTOMY     WITH DEBULKING/BSO  . CHOLECYSTECTOMY     early 5s  . CYSTOSCOPY W/ URETERAL STENT PLACEMENT Left 07/09/2015   DUE TO NEPHROLITHIASIS Procedure: CYSTOSCOPY WITH LEFT RETROGRADE PYELOGRAM/ LEFT URETERAL STENT PLACEMENT;  Surgeon: Ardis Hughs, MD;  [facility];  Service: Urology;  Laterality: Left;  . HERNIA REPAIR    . INCISIONAL HERNIA REPAIR N/A 12/25/2015   WITH MESH Procedure:  INCISIONAL HERNIA REPAIR;   Surgeon: Ralene Ok, MD;  [facility];  Service: General;  Laterality: N/A;  . INCISIONAL HERNIA REPAIR N/A 10/21/2018   Procedure: LAPAROSCOPIC INCISIONAL HERNIA REPAIR WITH MESH;  Surgeon: Ralene Ok, MD;  Location: [place];  Service: General;  Laterality: N/A;  . INSERTION OF MESH N/A 12/25/2015   Procedure: INSERTION OF MESH;  Surgeon: Ralene Ok, MD;  [facility];  Service: General;  Laterality: N/A;  . LAPAROTOMY Bilateral 11/08/2012   Procedure: EXPLORATORY LAPAROTOMY BILATERAL SALPINGO OOPHORECTOMY TUMOR DEBULKING ;  Surgeon: Imagene Gurney A. Alycia Rossetti, MD;  [facility];  Service: Gynecology;  Laterality: Bilateral;  APPENDECTOMY / OMENTECTOMY  . LAPAROTOMY N/A 12/25/2015   Procedure: EXPLORATORY LAPAROTOMY;  Surgeon: Ralene Ok, MD;  [facility];  Service: General;  Laterality: N/A;  . LYSIS OF ADHESION N/A 12/25/2015   Procedure: LYSIS OF ADHESION;  Surgeon: Ralene Ok, MD;  [facility];  Service: General;  Laterality: N/A;  . PARTIAL HYSTERECTOMY  2006  . TRIGGER FINGER RELEASE      There were no vitals filed for this visit.   Subjective Assessment - 02/08/20 0806    Subjective My fatigue is pretty much the same. I felt pretty good after last night.  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  Recurrence found in 2019 with new nodules in the left lower quadrant and colon.  Just completed 6 sessions of chemotherapy and now on maintenance infusions every 3 weeks.  Fibromyalgia, Lumbar DDD, DM    Currently in Pain? Yes    Pain Score 4     Pain Location Back    Pain Orientation Lower    Pain Descriptors / Indicators Constant    Pain Type Chronic pain    Pain Onset More than a month ago    Pain Frequency Intermittent                             [facility] Adult PT Treatment/Exercise - 02/08/20 0001      Neuro Re-ed    Neuro Re-ed Details  In  ?? bars: tandem walk, tandem stance, tandem stance eyes closed, unilateral stance, unilateral stance reaching in various directions - all without hand held assist.       Exercises   Exercises Knee/Hip      Knee/Hip Exercises: Aerobic   Nustep Level 4, x 10 mins with therapist monitoring pt througout      Knee/Hip Exercises: Seated   Other Seated Knee/Hip Exercises Red theraband hip abduction seated x30, knee flexion x20                  PT Education - 02/08/20 0859    Education Details Standing SLR with red theraband Access Code: [email or web address]    Person(s) Educated Patient    Methods Explanation;Demonstration;Handout    Comprehension Returned demonstration;Verbalized understanding;Verbal cues required               PT Long Term Goals - 01/31/20 0859      PT LONG TERM GOAL #1   Title Pt will improve [place] ABS to 30/40    Baseline 26    Time 6    Period Weeks    Status New      PT LONG TERM GOAL #2   Title Pt will demonstrate single leg stance time of 10 seconds on the left LE    Baseline 4 seconds    Time 6    Period Weeks    Status New      PT LONG TERM GOAL #3   Title Pt will report no worsening of fatigue by the end of the day    Time 6    Period Weeks    Status New      PT LONG TERM GOAL #4   Title Pt will be ind with final HEP    Time 6    Period Weeks    Status New                 Plan - 02/08/20 1003    Clinical Impression Statement Patient tolerated treatment well today with only very short rest breaks required. She continues to struggle with balance and some c/o dixxiness. Left leg appears weaker than right. She required verbal cues frequently to slow down and have more controlled leg movements. Initiated HEP and she reported good understanding of that.    PT Frequency 2x / week    PT Duration 6 weeks    PT Treatment/Interventions ADLs/Self Care Home Management;Gait training;Therapeutic exercise;Patient/family education;Manual  techniques;Neuromuscular re-education    PT Next Visit Plan Cont nustep and balance to include  foam work, single leg and left LE acceptance activities, and activities when walking, include head turns    PT Home Exercise Plan Straight leg raises in standing with theraband    Consulted and Agree with Plan of Care Patient           Patient will benefit from skilled therapeutic intervention in order to improve the following deficits and impairments:  Decreased activity tolerance, Difficulty walking, Decreased balance, Postural dysfunction, Impaired flexibility, Decreased coordination, Decreased strength  Visit Diagnosis: Other abnormalities of gait and mobility  Aftercare following surgery for neoplasm     Problem List Patient Active Problem List   Diagnosis Date Noted  . Balance problems 01/29/2020  . Sinus congestion 01/29/2020  . Bilateral cataracts 12/12/2019  . UTI (urinary tract infection) 11/22/2019  . Dysuria 11/20/2019  . 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 Thrombocytopenia ([place]) 09/08/2019  . Fibromyalgia 09/08/2019  . Goals of care, counseling/discussion 08/08/2019  . Excessive body weight gain 05/09/2018  . Neoplastic malignant related fatigue 05/09/2018  . OSA on CPAP 05/09/2018  . Newly diagnosed diabetes ([place]) 05/09/2018  . Hypercalcemia 07/21/2016  . Other fatigue 07/15/2016  . Sicca syndrome, unspecified ([place]) 07/15/2016  . Excessive daytime sleepiness 05/18/2016  . Abdominal wall hernia 05/18/2016  . Activity intolerance related to fatigue 05/18/2016  . S/P hernia repair 12/25/2015  . BRCA negative 09/25/2015  . Pyelonephritis 07/09/2015  . Nephrolithiasis 07/09/2015  . Insomnia secondary to depression with anxiety 12/24/2014  . Gastritis 12/20/2014  . Port catheter in place 12/20/2014  . Insomnia 12/12/2013  . Obesity (BMI 35.0-39.9 without comorbidity) 12/12/2013  . OSA (obstructive sleep apnea) 11/24/2013  . 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 Ventral hernia 04/18/2013  . Essential hypertension 04/18/2013  . Fallopian tube carcinoma [facility]) 12/03/2012   Annia Friendly, PT 02/08/20 10:06 AM  [place], [place], [address] Phone: [phone]   Fax:  [phone]  Name: Modean Mccullum MRN: [id number] Date of Birth: 1946-02-16

## 2020-02-08 NOTE — Telephone Encounter (Signed)
I removed [facility] diagnosis.

## 2020-02-13 ENCOUNTER — Other Ambulatory Visit: Payer: Self-pay

## 2020-02-13 ENCOUNTER — Ambulatory Visit: Payer: Medicare HMO | Admitting: Rehabilitation

## 2020-02-13 ENCOUNTER — Encounter: Payer: Self-pay | Admitting: Rehabilitation

## 2020-02-13 ENCOUNTER — Other Ambulatory Visit: Payer: Self-pay | Admitting: Hematology and Oncology

## 2020-02-13 ENCOUNTER — Encounter: Payer: Self-pay | Admitting: Hematology and Oncology

## 2020-02-13 DIAGNOSIS — C5700 Malignant neoplasm of unspecified fallopian tube: Secondary | ICD-10-CM

## 2020-02-13 DIAGNOSIS — R2689 Other abnormalities of gait and mobility: Secondary | ICD-10-CM | POA: Diagnosis not present

## 2020-02-13 DIAGNOSIS — Z483 Aftercare following surgery for neoplasm: Secondary | ICD-10-CM | POA: Diagnosis not present

## 2020-02-13 DIAGNOSIS — I10 Essential (primary) hypertension: Secondary | ICD-10-CM

## 2020-02-13 MED ORDER — AMLODIPINE BESYLATE 10 MG PO TABS: 10.0000 mg | ORAL_TABLET | Freq: Every day | ORAL | 11 refills | Status: AC

## 2020-02-13 NOTE — Therapy (Signed)
[place], [place], [address] Phone: [phone]   Fax:  [phone]  Physical Therapy Treatment  Patient Details  Name: Kristin Webb MRN: [id number] Date of Birth: [birth year] Referring Provider (PT): Dr Alvy Bimler   Encounter Date: 02/13/2020   PT End of Session - 02/13/20 0931    Visit Number 4    Number of Visits 12    Date for PT Re-Evaluation 03/13/20    Authorization Type [employer] -  Needs Auth    PT Start Time 0804    PT Stop Time 0829    PT Time Calculation (min) 25 min    Activity Tolerance Patient limited by lethargy;Patient limited by fatigue    Behavior During Therapy [employer] for tasks assessed/performed           Past Medical History:  Diagnosis Date  . Burning mouth syndrome   . Chronic fatigue   . Constipation   . Diabetes ([place]) 05/09/2018  . Diarrhea    in the past after gallbladder removal  . Fibromyalgia   . GERD (gastroesophageal reflux disease)   . History of kidney stones 07/2015  . Hyperlipidemia   . Hypertension    borderline not on meds   . Insomnia secondary to depression with anxiety   . Lumbar disc disease   . Ovarian cancer ([place]) 2014/2020   met nodule in abd  . Pelvic mass in female   . Pneumonia    hx of pneumonia as an infant  . PONV (postoperative nausea and vomiting)    pain from gas hernia 2017  . Shortness of breath    with exertion   . Sleep apnea    uses CPAP  . Yeast infection     Past Surgical History:  Procedure Laterality Date  . ABDOMINAL HYSTERECTOMY     early 75s  . APPENDECTOMY     WITH DEBULKING/BSO  . CHOLECYSTECTOMY     early 36s  . CYSTOSCOPY W/ URETERAL STENT PLACEMENT Left 07/09/2015   DUE TO NEPHROLITHIASIS Procedure: CYSTOSCOPY WITH LEFT RETROGRADE PYELOGRAM/ LEFT URETERAL STENT PLACEMENT;  Surgeon: Ardis Hughs, MD;  Location: [facility];  Service: Urology;  Laterality: Left;  . HERNIA REPAIR    . INCISIONAL HERNIA  REPAIR N/A 12/25/2015   WITH MESH Procedure:  INCISIONAL HERNIA REPAIR;  Surgeon: Ralene Ok, MD;  Location[facility];  Service: General;  Laterality: N/A;  . INCISIONAL HERNIA REPAIR N/A 10/21/2018   Procedure: LAPAROSCOPIC INCISIONAL HERNIA REPAIR WITH MESH;  Surgeon: Ralene Ok, MD;  Location: [facility];  Service: General;  Laterality: N/A;  . INSERTION OF MESH N/A 12/25/2015   Procedure: INSERTION OF MESH;  Surgeon: Ralene Ok, MD;  [facility];  Service: General;  Laterality: N/A;  . LAPAROTOMY Bilateral 11/08/2012   Procedure: EXPLORATORY LAPAROTOMY BILATERAL SALPINGO OOPHORECTOMY TUMOR DEBULKING ;  Surgeon: Imagene Gurney A. Alycia Rossetti, MD;  Location[facility];  Service: Gynecology;  Laterality: Bilateral;  APPENDECTOMY / OMENTECTOMY  . LAPAROTOMY N/A 12/25/2015   Procedure: EXPLORATORY LAPAROTOMY;  Surgeon: Ralene Ok, MD;  [facility];  Service: General;  Laterality: N/A;  . LYSIS OF ADHESION N/A 12/25/2015   Procedure: LYSIS OF ADHESION;  Surgeon: Ralene Ok, MD;  [facility];  Service: General;  Laterality: N/A;  . PARTIAL HYSTERECTOMY  2006  . TRIGGER FINGER RELEASE      There were no vitals filed for this visit.   Subjective Assessment - 02/13/20 0911    Subjective I am  not good today.  My fatigue is really bad and dizziness.  Its hard today.  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  Recurrence found in 2019 with new nodules in the left lower quadrant and colon.  Just completed 6 sessions of chemotherapy and now on maintenance infusions every 3 weeks.  Fibromyalgia, Lumbar DDD, DM    How long can you walk comfortably? If I get too fatigued I have to stop walking    Patient Stated Goals I'd like to stop bumping in to things.    Currently in Pain? Yes    Pain Score 4     Pain Location Back    Pain Orientation Lower    Pain  Descriptors / Indicators Constant    Pain Type Chronic pain    Pain Frequency Intermittent                             [facility] Adult PT Treatment/Exercise - 02/13/20 0001      Neuro Re-ed    Neuro Re-ed Details  in parallel bars: heel to toe walking on line x 2 lengths per pt request       Knee/Hip Exercises: Aerobic   Nustep level 3 x 84mn stopping due to not feeling well rechecked BP at 160/95                        PT Long Term Goals - 01/31/20 0859      PT LONG TERM GOAL #1   Title Pt will improve [place] ABS to 30/40    Baseline 26    Time 6    Period Weeks    Status New      PT LONG TERM GOAL #2   Title Pt will demonstrate single leg stance time of 10 seconds on the left LE    Baseline 4 seconds    Time 6    Period Weeks    Status New      PT LONG TERM GOAL #3   Title Pt will report no worsening of fatigue by the end of the day    Time 6    Period Weeks    Status New      PT LONG TERM GOAL #4   Title Pt will be ind with final HEP    Time 6    Period Weeks    Status New                 Plan - 02/13/20 0932    Clinical Impression Statement Pt arrived feeling yucky and reporting that her blood pressure continues to creep higher and higher but wanting to come so PT could see how she is on a bad day.  Attempted nustep level x only tolerated for 3 minutes.  Rechecked BP which was around 158/90.  Pt is going to let Dr. GAlvy Bimlerknow about the BP slow increases and worsening tolerance to activity as she was told to expect this.  Pt feels fine to drive home and will attempt to walk a few more times at home.    PT Frequency 2x / week    PT Duration 6 weeks    PT Treatment/Interventions ADLs/Self Care Home Management;Gait training;Therapeutic exercise;Patient/family education;Manual techniques;Neuromuscular re-education    PT Next Visit Plan how is  BP? Cont nustep and balance to include foam work, single leg and left LE acceptance  activities, and activities when walking, include head turns    Consulted and Agree with Plan of Care Patient           Patient will benefit from skilled therapeutic intervention in order to improve the following deficits and impairments:     Visit Diagnosis: Other abnormalities of gait and mobility  Aftercare following surgery for neoplasm     Problem List Patient Active Problem List   Diagnosis Date Noted  . Balance problems 01/29/2020  . Sinus congestion 01/29/2020  . Bilateral cataracts 12/12/2019  . UTI (urinary tract infection) 11/22/2019  . Dysuria 11/20/2019  . 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 Thrombocytopenia ([place]) 09/08/2019  . Fibromyalgia 09/08/2019  . Goals of care, counseling/discussion 08/08/2019  . Excessive body weight gain 05/09/2018  . Neoplastic malignant related fatigue 05/09/2018  . OSA on CPAP 05/09/2018  . Newly diagnosed diabetes ([place]) 05/09/2018  . Hypercalcemia 07/21/2016  . Other fatigue 07/15/2016  . Excessive daytime sleepiness 05/18/2016  . Abdominal wall hernia 05/18/2016  . Activity intolerance related to fatigue 05/18/2016  . S/P hernia repair 12/25/2015  . BRCA negative 09/25/2015  . Pyelonephritis 07/09/2015  . Nephrolithiasis 07/09/2015  . Insomnia secondary to depression with anxiety 12/24/2014  . Gastritis 12/20/2014  . Port catheter in place 12/20/2014  . Insomnia 12/12/2013  . Obesity (BMI 35.0-39.9 without comorbidity) 12/12/2013  . OSA (obstructive sleep apnea) 11/24/2013  . Burning mouth syndrome 11/24/2013  . Ventral hernia 04/18/2013  . Essential hypertension 04/18/2013  . 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

## 2020-02-15 ENCOUNTER — Encounter: Payer: Self-pay | Admitting: Rehabilitation

## 2020-02-15 ENCOUNTER — Other Ambulatory Visit: Payer: Self-pay

## 2020-02-15 ENCOUNTER — Ambulatory Visit: Payer: Medicare HMO | Admitting: Rehabilitation

## 2020-02-15 DIAGNOSIS — R2689 Other abnormalities of gait and mobility: Secondary | ICD-10-CM | POA: Diagnosis not present

## 2020-02-15 DIAGNOSIS — Z483 Aftercare following surgery for neoplasm: Secondary | ICD-10-CM

## 2020-02-15 NOTE — Therapy (Signed)
[place], [place], [address] Phone: [phone]   Fax:  [phone]  Physical Therapy Treatment  Patient Details  Name: Kristin Webb MRN: [id number] Date of Birth: 1946/01/16 Referring Provider (PT): Dr Alvy Bimler   Encounter Date: 02/15/2020   PT End of Session - 02/15/20 0948    Visit Number 5    Number of Visits 12    Date for PT Re-Evaluation 03/13/20    Authorization Type Approved 12 visits from 6/23-8/4    Authorization - Visit Number 5    Authorization - Number of Visits 12    PT Start Time 0800    PT Stop Time 0840    PT Time Calculation (min) 40 min    Activity Tolerance Patient tolerated treatment well    Behavior During Therapy [facility] for tasks assessed/performed           Past Medical History:  Diagnosis Date  . Burning mouth syndrome   . Chronic fatigue   . Constipation   . Diabetes ([place]) 05/09/2018  . Diarrhea    in the past after gallbladder removal  . Fibromyalgia   . GERD (gastroesophageal reflux disease)   . History of kidney stones 07/2015  . Hyperlipidemia   . Hypertension    borderline not on meds   . Insomnia secondary to depression with anxiety   . Lumbar disc disease   . Ovarian cancer ([place]) 2014/2020   met nodule in abd  . Pelvic mass in female   . Pneumonia    hx of pneumonia as an infant  . PONV (postoperative nausea and vomiting)    pain from gas hernia 2017  . Shortness of breath    with exertion   . Sleep apnea    uses CPAP  . Yeast infection     Past Surgical History:  Procedure Laterality Date  . ABDOMINAL HYSTERECTOMY     early 34s  . APPENDECTOMY     WITH DEBULKING/BSO  . CHOLECYSTECTOMY     early 62s  . CYSTOSCOPY W/ URETERAL STENT PLACEMENT Left 07/09/2015   DUE TO NEPHROLITHIASIS Procedure: CYSTOSCOPY WITH LEFT RETROGRADE PYELOGRAM/ LEFT URETERAL STENT PLACEMENT;  Surgeon: Ardis Hughs, MD;  [facility];  Service: Urology;   Laterality: Left;  . HERNIA REPAIR    . INCISIONAL HERNIA REPAIR N/A 12/25/2015   WITH MESH Procedure:  INCISIONAL HERNIA REPAIR;  Surgeon: Ralene Ok, MD;  [facility];  Service: General;  Laterality: N/A;  . INCISIONAL HERNIA REPAIR N/A 10/21/2018   Procedure: LAPAROSCOPIC INCISIONAL HERNIA REPAIR WITH MESH;  Surgeon: Ralene Ok, MD;  Location: [facility];  Service: General;  Laterality: N/A;  . INSERTION OF MESH N/A 12/25/2015   Procedure: INSERTION OF MESH;  Surgeon: Ralene Ok, MD;  [facility];  Service: General;  Laterality: N/A;  . LAPAROTOMY Bilateral 11/08/2012   Procedure: EXPLORATORY LAPAROTOMY BILATERAL SALPINGO OOPHORECTOMY TUMOR DEBULKING ;  Surgeon: Imagene Gurney A. Alycia Rossetti, MD;  [facility];  Service: Gynecology;  Laterality: Bilateral;  APPENDECTOMY / OMENTECTOMY  . LAPAROTOMY N/A 12/25/2015   Procedure: EXPLORATORY LAPAROTOMY;  Surgeon: Ralene Ok, MD;  [facility];  Service: General;  Laterality: N/A;  . LYSIS OF ADHESION N/A 12/25/2015   Procedure: LYSIS OF ADHESION;  Surgeon: Ralene Ok, MD;  [facility];  Service: General;  Laterality: N/A;  . PARTIAL HYSTERECTOMY  2006  . TRIGGER FINGER RELEASE      There were no vitals filed for this  visit.   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  Recurrence found in 2019 with new nodules in the left lower quadrant and colon.  Just completed 6 sessions of chemotherapy and now on maintenance infusions every 3 weeks.  Fibromyalgia, Lumbar DDD, DM    How long can you walk comfortably? If I get too fatigued I have to stop walking    Patient Stated Goals I'd like to stop bumping in to things.    Currently in Pain? Yes    Pain Score 4     Pain Location Back    Pain Orientation Lower    Pain  Descriptors / Indicators Constant    Pain Type Chronic pain    Pain Onset More than a month ago    Pain Frequency Intermittent                             [facility] Adult PT Treatment/Exercise - 02/15/20 0001      Neuro Re-ed    Neuro Re-ed Details  in CR gym: at back of bike foam stance x 30", foam march x 10, trunk rotation R/L x 10 each, SL stance work 3xmax bil, step ups x 5 bil with hand hold as needed and CGA by PT.  At counter tandem stance 20" x 2 bil, tandem forward and backward walking x 3 lengths, heel raises / toe raises x 10, heel to toe rocking x 10 with CGA with LOB x 1 posteriorly with rocking and hand hold as needed on counter during tandem walk.  In hallway ladder lateral steps x 2 lengths, then box steps x 5 SPV only.  Cone weaving x 2 lengths and cone step overs x 2 lengths                        PT Long Term Goals - 01/31/20 0859      PT LONG TERM GOAL #1   Title Pt will improve [place] ABS to 30/40    Baseline 26    Time 6    Period Weeks    Status New      PT LONG TERM GOAL #2   Title Pt will demonstrate single leg stance time of 10 seconds on the left LE    Baseline 4 seconds    Time 6    Period Weeks    Status New      PT LONG TERM GOAL #3   Title Pt will report no worsening of fatigue by the end of the day    Time 6    Period Weeks    Status New      PT LONG TERM GOAL #4   Title Pt will be ind with final HEP    Time 6    Period Weeks    Status New                 Plan - 02/15/20 1601    Clinical Impression Statement Pt arrives feeling better today.  Pt took 1.5x BP dose and will be getting a new pill to add in the evening today.  Pt did have high BP arriving to clinic at 185/101 so resistance and nustep were omitted  today with focus on symptom based balance work which pt tolerated well and half way through session had a decreased BP of 175/98.  Pt reported balance challenges today were not too hard but made her  work and think about her balance.  LOB only x 1 posteriorly .    PT Frequency 2x / week    PT Duration 6 weeks    PT Treatment/Interventions ADLs/Self Care Home Management;Gait training;Therapeutic exercise;Patient/family education;Manual techniques;Neuromuscular re-education    PT Next Visit Plan how is BP at home (pt checks every day) monitor in clinic if continues to be high.  Cont nustep and balance to include foam work, single leg and left LE acceptance activities, and activities when walking, include head turns    Consulted and Agree with Plan of Care Patient           Patient will benefit from skilled therapeutic intervention in order to improve the following deficits and impairments:     Visit Diagnosis: Other abnormalities of gait and mobility  Aftercare following surgery for neoplasm     Problem List Patient Active Problem List   Diagnosis Date Noted  . Balance problems 01/29/2020  . Sinus congestion 01/29/2020  . Bilateral cataracts 12/12/2019  . UTI (urinary tract infection) 11/22/2019  . Dysuria 11/20/2019  . 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 Thrombocytopenia ([place]) 09/08/2019  . Fibromyalgia 09/08/2019  . Goals of care, counseling/discussion 08/08/2019  . Excessive body weight gain 05/09/2018  . Neoplastic malignant related fatigue 05/09/2018  . OSA on CPAP 05/09/2018  . Newly diagnosed diabetes ([place]) 05/09/2018  . Hypercalcemia 07/21/2016  . Other fatigue 07/15/2016  . Excessive daytime sleepiness 05/18/2016  . Abdominal wall hernia 05/18/2016  . Activity intolerance related to fatigue 05/18/2016  . S/P hernia repair 12/25/2015  . BRCA negative 09/25/2015  . Pyelonephritis 07/09/2015  . Nephrolithiasis 07/09/2015  . Insomnia secondary to depression with anxiety 12/24/2014  . Gastritis 12/20/2014  . Port catheter in place 12/20/2014  . Insomnia 12/12/2013  . Obesity (BMI 35.0-39.9 without comorbidity) 12/12/2013  . OSA  (obstructive sleep apnea) 11/24/2013  . Burning mouth syndrome 11/24/2013  . Ventral hernia 04/18/2013  . Essential hypertension 04/18/2013  . 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

## 2020-02-16 DIAGNOSIS — Z961 Presence of intraocular lens: Secondary | ICD-10-CM | POA: Diagnosis not present

## 2020-02-19 ENCOUNTER — Inpatient Hospital Stay: Payer: Medicare HMO | Attending: Gynecologic Oncology

## 2020-02-19 ENCOUNTER — Inpatient Hospital Stay: Payer: Medicare HMO | Admitting: Hematology and Oncology

## 2020-02-19 ENCOUNTER — Other Ambulatory Visit: Payer: Self-pay

## 2020-02-19 ENCOUNTER — Inpatient Hospital Stay: Payer: Medicare HMO

## 2020-02-19 DIAGNOSIS — G4700 Insomnia, unspecified: Secondary | ICD-10-CM

## 2020-02-19 DIAGNOSIS — C5701 Malignant neoplasm of right fallopian tube: Secondary | ICD-10-CM

## 2020-02-19 DIAGNOSIS — C5700 Malignant neoplasm of unspecified fallopian tube: Secondary | ICD-10-CM | POA: Diagnosis not present

## 2020-02-19 DIAGNOSIS — R0981 Nasal congestion: Secondary | ICD-10-CM

## 2020-02-19 DIAGNOSIS — I10 Essential (primary) hypertension: Secondary | ICD-10-CM | POA: Insufficient documentation

## 2020-02-19 DIAGNOSIS — T451X5A Adverse effect of antineoplastic and immunosuppressive drugs, initial encounter: Secondary | ICD-10-CM | POA: Diagnosis not present

## 2020-02-19 DIAGNOSIS — G620 Drug-induced polyneuropathy: Secondary | ICD-10-CM | POA: Insufficient documentation

## 2020-02-19 DIAGNOSIS — R5383 Other fatigue: Secondary | ICD-10-CM | POA: Diagnosis not present

## 2020-02-19 DIAGNOSIS — D696 Thrombocytopenia, unspecified: Secondary | ICD-10-CM | POA: Insufficient documentation

## 2020-02-19 DIAGNOSIS — Z5112 Encounter for antineoplastic immunotherapy: Secondary | ICD-10-CM | POA: Diagnosis not present

## 2020-02-19 DIAGNOSIS — Z79899 Other long term (current) drug therapy: Secondary | ICD-10-CM | POA: Diagnosis not present

## 2020-02-19 DIAGNOSIS — Z7189 Other specified counseling: Secondary | ICD-10-CM

## 2020-02-19 DIAGNOSIS — R2689 Other abnormalities of gait and mobility: Secondary | ICD-10-CM | POA: Diagnosis not present

## 2020-02-19 LAB — CBC WITH DIFFERENTIAL (CANCER CENTER ONLY)
Abs Immature Granulocytes: 0.01 10*3/uL (ref 0.00–0.07)
Basophils Absolute: 0 10*3/uL (ref 0.0–0.1)
Basophils Relative: 1 %
Eosinophils Absolute: 0.1 10*3/uL (ref 0.0–0.5)
Eosinophils Relative: 3 %
HCT: 39.8 % (ref 36.0–46.0)
Hemoglobin: 13.2 g/dL (ref 12.0–15.0)
Immature Granulocytes: 0 %
Lymphocytes Relative: 30 %
Lymphs Abs: 1.4 10*3/uL (ref 0.7–4.0)
MCH: 31.9 pg (ref 26.0–34.0)
MCHC: 33.2 g/dL (ref 30.0–36.0)
MCV: 96.1 fL (ref 80.0–100.0)
Monocytes Absolute: 0.5 10*3/uL (ref 0.1–1.0)
Monocytes Relative: 10 %
Neutro Abs: 2.5 10*3/uL (ref 1.7–7.7)
Neutrophils Relative %: 56 %
Platelet Count: 129 10*3/uL — ABNORMAL LOW (ref 150–400)
RBC: 4.14 MIL/uL (ref 3.87–5.11)
RDW: 12.5 % (ref 11.5–15.5)
WBC Count: 4.5 10*3/uL (ref 4.0–10.5)
nRBC: 0 % (ref 0.0–0.2)

## 2020-02-19 LAB — CMP (CANCER CENTER ONLY)
ALT: 27 U/L (ref 0–44)
AST: 21 U/L (ref 15–41)
Albumin: 4.1 g/dL (ref 3.5–5.0)
Alkaline Phosphatase: 91 U/L (ref 38–126)
Anion gap: 13 (ref 5–15)
BUN: 11 mg/dL (ref 8–23)
CO2: 25 mmol/L (ref 22–32)
Calcium: 9.9 mg/dL (ref 8.9–10.3)
Chloride: 104 mmol/L (ref 98–111)
Creatinine: 0.69 mg/dL (ref 0.44–1.00)
GFR, Est AFR Am: >60 mL/min (ref >60)
GFR, Estimated: >60 mL/min (ref >60)
Glucose, Bld: 102 mg/dL — ABNORMAL HIGH (ref 70–99)
Potassium: 3.9 mmol/L (ref 3.5–5.1)
Sodium: 142 mmol/L (ref 135–145)
Total Bilirubin: 0.3 mg/dL (ref 0.3–1.2)
Total Protein: 7.1 g/dL (ref 6.5–8.1)

## 2020-02-19 LAB — TOTAL PROTEIN, URINE DIPSTICK: Protein, ur: NEGATIVE mg/dL

## 2020-02-19 MED ORDER — SODIUM CHLORIDE 0.9 % IV SOLN
Freq: Once | INTRAVENOUS | Status: AC
  Filled 2020-02-19: qty 250

## 2020-02-19 MED ORDER — SODIUM CHLORIDE 0.9% FLUSH
10.0000 mL | Freq: Once | INTRAVENOUS | Status: AC
  Administered 2020-02-19: 10 mL
  Filled 2020-02-19: qty 10

## 2020-02-19 MED ORDER — SODIUM CHLORIDE 0.9 % IV SOLN
15.0000 mg/kg | Freq: Once | INTRAVENOUS | Status: AC
  Administered 2020-02-19: 1300 mg via INTRAVENOUS
  Filled 2020-02-19: qty 48

## 2020-02-19 MED ORDER — SODIUM CHLORIDE 0.9% FLUSH
10.0000 mL | INTRAVENOUS | Status: DC | PRN
  Administered 2020-02-19: 10 mL
  Filled 2020-02-19: qty 10

## 2020-02-19 MED ORDER — HEPARIN SOD (PORK) LOCK FLUSH 100 UNIT/ML IV SOLN
500.0000 [IU] | Freq: Once | INTRAVENOUS | Status: AC | PRN
  Administered 2020-02-19: 500 [IU]
  Filled 2020-02-19: qty 5

## 2020-02-19 NOTE — Patient Instructions (Signed)
[facility] Discharge Instructions for Patients Receiving Chemotherapy  Today you received the following chemotherapy agents: bevacizumab  To help prevent nausea and vomiting after your treatment, we encourage you to take your nausea medication as directed.   If you develop nausea and vomiting that is not controlled by your nausea medication, call the clinic.   BELOW ARE SYMPTOMS THAT SHOULD BE REPORTED IMMEDIATELY:  *FEVER GREATER THAN 100.5 F  *CHILLS WITH OR WITHOUT FEVER  NAUSEA AND VOMITING THAT IS NOT CONTROLLED WITH YOUR NAUSEA MEDICATION  *UNUSUAL SHORTNESS OF BREATH  *UNUSUAL BRUISING OR BLEEDING  TENDERNESS IN MOUTH AND THROAT WITH OR WITHOUT PRESENCE OF ULCERS  *URINARY PROBLEMS  *BOWEL PROBLEMS  UNUSUAL RASH Items with * indicate a potential emergency and should be followed up as soon as possible.  Feel free to call the clinic should you have any questions or concerns. The clinic phone number is [phone].  Please show the CHEMO ALERT CARD at check-in to the Emergency Department and triage nurse.   

## 2020-02-20 ENCOUNTER — Encounter: Payer: Self-pay | Admitting: Hematology and Oncology

## 2020-02-20 NOTE — Assessment & Plan Note (Signed)
Her thrombocytopenia is stable Observe only for now 

## 2020-02-20 NOTE — Progress Notes (Signed)
[facility] OFFICE PROGRESS NOTE  Patient Care Team: McNeill, Wendy, MD as PCP - General (Family Medicine) McNeill, Wendy, MD as Attending Physician (Family Medicine)  ASSESSMENT & PLAN:  Fallopian tube carcinoma (HCC) She has multiple different problems but I do not believe they are related to her treatment Continue supportive care After fourth dose of treatment, we will repeat imaging study  Thrombocytopenia (HCC) Her thrombocytopenia is stable Observe only for now  Sinus congestion She has significant nasal congestion She is not interested to pursue any form of treatment right now  Peripheral neuropathy due to chemotherapy (HCC) She has significant neuropathy with gait imbalance I recommend she pursue physical therapy  Essential hypertension Her blood pressure is stable Monitor closely  Insomnia She is prescribed anxiolytic by another physician but does not sleep well recently She also have excessive fatigue I recommend she consult with a neurologist for further follow-up   No orders of the defined types were placed in this encounter.   All questions were answered. The patient knows to call the clinic with any problems, questions or concerns. The total time spent in the appointment was 25 minutes encounter with patients including review of chart and various tests results, discussions about plan of care and coordination of care plan   Ni Gorsuch, MD 02/20/2020 6:55 AM  INTERVAL HISTORY: Please see below for problem oriented charting. She returns for further follow-up She had multiple issues She has significant gait imbalance, excessive fatigue, generalized pain, sinus congestions and others Her blood pressure control so far is good Denies abdominal pain, nausea or changes in bowel habits She is taking Lunesta to help her with sleep and she used to sleep well but recently did not sleep well  SUMMARY OF ONCOLOGIC HISTORY: Oncology History Overview  Note  Oncologic Summary: 1. 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 Random CA 125 elevation January 2019  Question mesenteric nodules and anterior abdominal wall nodule 3. GeneDx Breast/Ovary Panel negative (including BRCA, MMR's, RAD51 etc) ? [facility] BRACAnalysis  Negative for BRCA1/2 in tumor   Fallopian tube carcinoma (HCC)  11/01/2012 Imaging   Ct abdomen 1.  Interval development of large mid abdominal mass highly concerning for right ovarian cancer.  There is mild omental nodularity on the left, and peritoneal disease cannot be completely excluded.  There is no ascites or other evidence of metastatic disease. 2.  Mild associated renal pelvocaliectasis bilaterally without obstruction.  Nonobstructing left renal calculus and a small right renal angiomyolipoma noted incidentally.   11/08/2012 Pathology Results   1. 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 - BENIGN FALLOPIAN TUBE WITH NONSPECIFIC CHRONIC INFLAMMATION. 2. Ovary and fallopian tube, left - BENIGN OVARY; NEGATIVE FOR ATYPIA OR MALIGNANCY. - BENIGN FALLOPIAN TUBE; NEGATIVE FOR ATYPIA OR MALIGNANCY. 3. Omentum, resection for tumor - HIGH GRADE CARCINOMA, SEE COMMENT. 4. Appendix, Other than Incidental - FIBROUS OBLITERATION OF APPENDICEAL TIP. - NEGATIVE FOR MALIGNANCY.   11/08/2012 Surgery   Surgery: Exploratory laparotomy, bilateral salpingo-oophorectomy, appendectomy, infacolic omentectomy, optimal debulking  Surgeons:  Paola A. Gehrig, MD; Lisa Jackson-Moore, MD   Assistant: Nancy Wilkinson  Pathology: Bilateral fallopian  tubes and ovaries to pathology. Appendix as   well as omentum. 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  Operative findings: 25 cm right adnexal mass with smooth surface. Surgically absent uterus. Atrophic-appearing left ovary. Normal appearing appendix. Within the omentum there were centimeter nodules scattered throughout the omentum. The remainder of the surfaces were benign.   12/08/2012 Procedure   Impression:  Placement of a subcutaneous port device.  The catheter tip is in the lower SVC and ready to be used.    12/13/2012 - 03/28/2013 Chemotherapy   s/p 6 cycles of paclitaxel and carboplatin   12/13/2012 - 03/28/2013 Chemotherapy   The patient had 6 cycles of carboplatin and Taxol   03/24/2013 Imaging   US abdomen   04/24/2013 Imaging   CT abdomen Interval resection of the large right pelvic and lower abdominal mass lesion with apparent omentectomy.  No evidence for intraperitoneal free fluid on today's study.  No discernible peritoneal lesions.  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   09/07/2013 Genetic Testing   Patient has genetic testing done for BRCA1/2 panel Results revealed patient has no mutation(s):   07/09/2015 Imaging   CT abdomen 1. 12 mm obstructive calculus at the left ureteropelvic junction with moderate proximal hydronephrosis. 2. 2 small supraumbilical ventral hernias, one containing a short segment of the mid transverse colon and the other containing a short segment of the mid small bowel. There is no associated evidence to suggest bowel incarceration or obstruction at this time. 3. Tiny locule of gas non dependently in the lumen of the urinary bladder. This is presumably iatrogenic related to recent catheterization for urinalysis. Alternatively, this could be seen in the setting of urinary tract infection with gas-forming organisms. Clinical correlation for history of recent  catheterization is recommended. 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 5. Status post cholecystectomy. 6. Additional incidental findings, as above.    12/11/2016 Imaging   Ct abdomen 1. No evidence of metastatic ovarian cancer. 2. Recurrent subxiphoid ventral abdominal wall hernia containing transverse colon. No evidence of incarceration or obstruction. 3. Stable incidental findings in the liver and kidneys. No recurrent urinary tract calculus. 4. Progressive lower lumbar spondylosis. 5.  Aortic Atherosclerosis (ICD10-I70.0).    08/30/2017 Imaging   MRI thoracic spine 1. At T5-6 there is a small central disc protrusion contacting the ventral thoracic spinal cord. No central canal or foraminal stenosis. 2. At T9-10 there is a small right paracentral disc protrusion. 3.  No acute osseous injury of the thoracic spine. 4. No aggressive osseous lesion to suggest metastatic disease.   09/24/2017 Tumor Marker   Patient's tumor was tested for the following markers: CA-125 Results of the tumor marker test revealed 21.7   09/30/2017 Imaging   CT abdomen 1. New small clustered soft tissue nodules in the left lower quadrant in the sigmoid mesentery, largest 1.0 cm, which could represent recurrent peritoneal tumor implants. No ascites.  2. Midline high ventral abdominal wall hernia containing a portion of the transverse colon is mildly increased in size, and without bowel complication at this time. 3. Chronic findings include: Aortic Atherosclerosis (ICD10-I70.0). Diffuse hepatic steatosis. 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   10/11/2017 PET scan   1. Nodules in the sigmoid mesentery are hypermetabolic and highly worrisome for metastatic disease. 2. Attic steatosis.    11/03/2017 Procedure   Successful CT-guided rectus abdominal muscle mass core biopsy. Path: - FOREIGN   BODY GIANT CELL REACTION INVOLVING  FIBROADIPOSE TISSUE AND SKELETAL MUSCLE. - NO EVIDENCE OF MALIGNANCY.   11/04/2017 Cancer Staging   Staging form: Fallopian Tube, AJCC 7th Edition - Clinical: FIGO Stage IIIC, calculated as Stage IV (rT3, N0, M1) - Signed by [name], MD on 08/09/2019   02/2018 Imaging   CT: 1. Continued increase in size small peritoneal nodules along the mesenteric border of the proximal sigmoid colon as well as along the serosal surface of the proximal sigmoid colon. 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 2. No evidence of distant disease. 3. Stable large ventral hernia.   05/2018 Imaging   PET: 1. Redemonstration of hypermetabolic nodules within the sigmoid mesocolon. Mild response to therapy relative to CT of 02/24/2018. Mixed response to therapy compared to the most recent PET of 10/11/2017. 2. Hypermetabolism within the right pelvic rectus musculature, increased since the prior PET.  Clinical service requested comparison to the 11/24/2017 CT. Index 10 mm nodule within the sigmoid mesocolon was similar to the 02/24/2018 CT, and as described on that exam, increased from 7 mm on 11/24/2017. More inferior nodule within the mesocolon measures 12 mm today on image 156/4 and 8 mm on 11/24/2017.   05/2018 Imaging   CT: IMPRESSION: 1. Since 02/24/2018, decreased size of peritoneal nodules centered in the sigmoid mesocolon. 2. No evidence of new or progressive disease. 3. Hepatic steatosis. 4. Subcentimeter right renal angiomyolipoma, similar. 5.  Aortic Atherosclerosis (ICD10-I70.0). 6. Ventral abdominal wall laxity containing transverse colon, similar.   08/2018 Imaging   CT: IMPRESSION: 1. Nodules within the sigmoid mesocolon have decreased in size compared to prior. No new peritoneal or omental nodularity. 2. No evidence local recurrence the pelvis. 3. Ventral hernia contains a segment of transverse colon. No change from prior.     06/19/2019 Imaging   1. No  evidence of metastatic disease in the abdomen pelvis. 2. No peritoneal or omental metastasis identified.  No free fluid. 3. Postcholecystectomy and hysterectomy. Comparison exams are made available. Comparison CT 09/08/2018 and 05/27/2018. PET-CT 05/27/2018    There is a nodule within the proximal aspect of the sigmoid colon measuring 2.2 by 2.2 cm. In comparison to prior CTs and PET-CT there was a hypermetabolic nodule at this location on the PET-CT of 08/27/2017 and on the CT of 09/08/2018 there was a small residual nodule. At that time (09/08/2018) the nodule measured 1.4 by 1.3 cm. Therefore this nodule has increased in the interval and concerning for recurrence of a serosal implant. The previous described lymph nodes in the sigmoid mesocolon and more central mesentery are not increased in size and not pathologic by size criteria.    Concern for recurrence of serosal metastasis in the proximal sigmoid colon with a 2 cm enlarging lesion. Lesion extends into the lumen. No evidence of high-grade obstruction. Consider FDG PET scan and/or potential colonoscopy for evaluation.   06/29/2019 PET scan   1. Enlarging serosal implant within the proximal sigmoid colon has intense metabolic activity most consistent with malignancy. Lesion exhibits a somewhat indolent progression as present on PET-CT scan from 10/11/2017 and 05/27/2018. 2. Focal hypermetabolic activity within the RIGHT rectus muscle just off midline is slightly decreased from comparison exam. This may be benign inflammation related to prior laparotomy, however malignancy not excluded.   07/26/2019 Procedure   She had colonoscopy which showed multiple polyps.  6 polyps were removed from the cecum, measuring 3 to 6 mm in size.  One 12 mm polyp was  removed from ascending colon and another 1 measures 7 mm.  One 5 mm polyp was removed from the transverse colon.  There is tumor noted in the sigmoid colon, approximately 35 cm from the anus which was  biopsied.  The tumor appeared to be fungating, infiltrative and ulcerated but nonobstructive.  It encompassed approximately one third of the circumference of the lumen.   07/26/2019 Pathology Results   Multiple polyps came back tubular adenomas.  2 ascending polyps came back sessile serrated adenoma months.  Sigmoid colon biopsy confirmed metastatic high-grade serous carcinoma.  The morphology and immunophenotype are consistent with metastatic high-grade serous carcinoma from tubal/ovarian primary site.   08/17/2019 - 12/12/2019 Chemotherapy   The patient had carboplatin and taxol   10/23/2019 Imaging   1. Sigmoid lesion with diminished size/conspicuity difficult to measure on the previous exam. Adjacent lymph nodes and nodularity less than a cm, largest on coronal image 48 measuring 5 mm. 2. Right rectus muscle with some thickening with mildly convex margin seen posteriorly on image 72 of series 2. This could be due to postsurgical change, however, metastatic disease is not excluded. Attention on follow-up is suggested. 3. No evidence for new metastatic disease. 4. Small hiatal hernia. 5. 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   Aortic Atherosclerosis (ICD10-I70.0).   01/01/2020 Imaging   1. No signs of new disease. 2. Tiny lymph nodes in the area of concern in the LEFT lower quadrant z. 3. Added density and subtle contour irregularity involving the rectus muscles best seen on sagittal images, associated with site of prior surgical incision just to the RIGHT of midline (image 72, series 2 and image 58, series 5. Not significantly changed compared to prior studies. This measures approximately 2.1 x 1 cm in the sagittal plane and is less well-defined in the axial plane. Potentially postoperative change. Attention on follow-up. 4. Aortic atherosclerosis.   Aortic Atherosclerosis (ICD10-I70.0).       01/29/2020 -  Chemotherapy   The patient had bevacizumab-bvzr (ZIRABEV) 1,300 mg in sodium  chloride 0.9 % 100 mL chemo infusion, 15 mg/kg = 1,300 mg, Intravenous,  Once, 2 of 4 cycles Administration: 1,300 mg (01/29/2020), 1,300 mg (02/19/2020)  for chemotherapy treatment.      REVIEW OF SYSTEMS:   Constitutional: Denies fevers, chills or abnormal weight loss Eyes: Denies blurriness of vision Ears, nose, mouth, throat, and face: Denies mucositis or sore throat Respiratory: Denies cough, dyspnea or wheezes Cardiovascular: Denies palpitation, chest discomfort or lower extremity swelling Gastrointestinal:  Denies nausea, heartburn or change in bowel habits Skin: Denies abnormal skin rashes Lymphatics: Denies new lymphadenopathy or easy bruising Behavioral/Psych: Mood is stable, no new changes  All other systems were reviewed with the patient and are negative.  I have reviewed the past medical history, past surgical history, social history and family history with the patient and they are unchanged from previous note.  ALLERGIES:  is allergic to ambien [zolpidem tartrate], bee venom, and cortisone.  MEDICATIONS:  Current Outpatient Medications  Medication Sig Dispense Refill  . amLODipine (NORVASC) 10 MG tablet Take 1 tablet (10 mg total) by mouth daily. 30 tablet 11  . Armodafinil 250 MG tablet Take 1 tablet (250 mg total) by mouth daily. 90 tablet 1  . Carboxymethylcellulose Sodium (EYE DROPS OP) Apply 2 drops to eye daily as needed (dry eye).    . cholecalciferol (VITAMIN D3) 25 MCG (1000 UT) tablet Take 1,000 Units by mouth daily.    . colestipol (COLESTID) 1 g   tablet Take 0.5-1 g by mouth daily. Pt taking 1/2 to 1 tablet daily depending on meal choice; taken 1 hour after morning meds , and can not eat for another hour    . EPINEPHrine (EPIPEN 2-PAK) 0.3 mg/0.3 mL DEVI Inject 0.3 mg into the muscle once as needed (For bee stings.). Reported on 01/20/2016    . eszopiclone (LUNESTA) 2 MG TABS tablet TAKE ONE TABLET BY MOUTH EVERYDAY AT BEDTIME 90 tablet 0  . FLUoxetine (PROZAC)  10 MG capsule Take 10 mg by mouth every morning. Takes with a 53m capsule for her total dose of 343m    . Marland KitchenLUoxetine (PROZAC) 20 MG capsule Take 20 mg by mouth every morning. Takes with a 1024mapsule for her total dose of 95m78m  . ibMarland Kitchenprofen (ADVIL,MOTRIN) 200 MG tablet Take 400-600 mg by mouth every 6 (six) hours as needed for mild pain or moderate pain.    . liMarland Kitchenocaine-prilocaine (EMLA) cream Apply 1 application topically as needed. 30 g 1  . losartan-hydrochlorothiazide (HYZAAR) 100-12.5 MG tablet Take 1 tablet by mouth daily.     . MEMarland KitchenATONIN PO Take 10 mg by mouth at bedtime.     . metFORMIN (GLUCOPHAGE-XR) 500 MG 24 hr tablet Take 500 mg by mouth daily with breakfast.   3  . Multiple Vitamin (MULTIVITAMIN WITH MINERALS) TABS Take 1 tablet by mouth every morning.     . naproxen sodium (ALEVE) 220 MG tablet Take 220 mg by mouth daily as needed (pain).     . ondansetron (ZOFRAN) 8 MG tablet Take 1 tablet (8 mg total) by mouth every 8 (eight) hours as needed for refractory nausea / vomiting. Start on day 3 after carboplatin chemo. 30 tablet 1  . prochlorperazine (COMPAZINE) 10 MG tablet Take 1 tablet (10 mg total) by mouth every 6 (six) hours as needed (Nausea or vomiting). 30 tablet 1  . rosuvastatin (CRESTOR) 20 MG tablet Take 20 mg by mouth every morning.      No current facility-administered medications for this visit.   Facility-Administered Medications Ordered in Other Visits  Medication Dose Route Frequency Provider Last Rate Last Admin  . sodium chloride flush (NS) 0.9 % injection 10 mL  10 mL Intravenous PRN GehrNancy Marus   10 mL at 05/12/17 [id number] EXAMINATION: ECOG PERFORMANCE STATUS: 1 - Symptomatic but completely ambulatory  Vitals:   02/19/20 0847  BP: (!) 142/80  Pulse: 77  Resp: 18  Temp: 97.8 F (36.6 C)  SpO2: 100%   Filed Weights   02/19/20 0847  Weight: 189 lb 9.6 oz (86 kg)    GENERAL:alert, no distress and comfortable SKIN: skin color,  texture, turgor are normal, no rashes or significant lesions EYES: normal, Conjunctiva are pink and non-injected, sclera clear OROPHARYNX:no exudate, no erythema and lips, buccal mucosa, and tongue normal  NECK: supple, thyroid normal size, non-tender, without nodularity LYMPH:  no palpable lymphadenopathy in the cervical, axillary or inguinal LUNGS: clear to auscultation and percussion with normal breathing effort HEART: regular rate & rhythm and no murmurs and no lower extremity edema ABDOMEN:abdomen soft, non-tender and normal bowel sounds Musculoskeletal:no cyanosis of digits and no clubbing  NEURO: alert & oriented x 3 with fluent speech, no focal motor/sensory deficits  LABORATORY DATA:  I have reviewed the data as listed    Component Value Date/Time   NA 142 02/19/2020 0812   NA 141 12/02/2016 1219   K 3.9 02/19/2020 0812   K  3.7 12/02/2016 1219   CL 104 02/19/2020 0812   CL 103 01/18/2013 1329   CO2 25 02/19/2020 0812   CO2 27 12/02/2016 1219   GLUCOSE 102 (H) 02/19/2020 0812   GLUCOSE 98 12/02/2016 1219   GLUCOSE 110 (H) 01/18/2013 1329   BUN 11 02/19/2020 0812   BUN 15.3 12/02/2016 1219   CREATININE 0.69 02/19/2020 0812   CREATININE 0.7 12/02/2016 1219   CALCIUM 9.9 02/19/2020 0812   CALCIUM 10.1 12/02/2016 1219   PROT 7.1 02/19/2020 0812   PROT 7.1 12/02/2016 1219   ALBUMIN 4.1 02/19/2020 0812   ALBUMIN 4.2 12/02/2016 1219   AST 21 02/19/2020 0812   AST 21 12/02/2016 1219   ALT 27 02/19/2020 0812   ALT 27 12/02/2016 1219   ALKPHOS 91 02/19/2020 0812   ALKPHOS 104 12/02/2016 1219   BILITOT 0.3 02/19/2020 0812   BILITOT 0.37 12/02/2016 1219   GFRNONAA >60 02/19/2020 0812   GFRAA >60 02/19/2020 0812    No results found for: SPEP, UPEP  Lab Results  Component Value Date   WBC 4.5 02/19/2020   NEUTROABS 2.5 02/19/2020   HGB 13.2 02/19/2020   HCT 39.8 02/19/2020   MCV 96.1 02/19/2020   PLT 129 (L) 02/19/2020      Chemistry      Component Value  Date/Time   NA 142 02/19/2020 0812   NA 141 12/02/2016 1219   K 3.9 02/19/2020 0812   K 3.7 12/02/2016 1219   CL 104 02/19/2020 0812   CL 103 01/18/2013 1329   CO2 25 02/19/2020 0812   CO2 27 12/02/2016 1219   BUN 11 02/19/2020 0812   BUN 15.3 12/02/2016 1219   CREATININE 0.69 02/19/2020 0812   CREATININE 0.7 12/02/2016 1219   GLU 236 12/13/2012 1558      Component Value Date/Time   CALCIUM 9.9 02/19/2020 0812   CALCIUM 10.1 12/02/2016 1219   ALKPHOS 91 02/19/2020 0812   ALKPHOS 104 12/02/2016 1219   AST 21 02/19/2020 0812   AST 21 12/02/2016 1219   ALT 27 02/19/2020 0812   ALT 27 12/02/2016 1219   BILITOT 0.3 02/19/2020 0812   BILITOT 0.37 12/02/2016 1219       

## 2020-02-20 NOTE — Assessment & Plan Note (Signed)
She has significant neuropathy with gait imbalance I recommend she pursue physical therapy

## 2020-02-20 NOTE — Assessment & Plan Note (Signed)
She has multiple different problems but I do not believe they are related to her treatment Continue supportive care After fourth dose of treatment, we will repeat imaging study

## 2020-02-20 NOTE — Assessment & Plan Note (Signed)
She is prescribed anxiolytic by another physician but does not sleep well recently She also have excessive fatigue I recommend she consult with a neurologist for further follow-up

## 2020-02-20 NOTE — Assessment & Plan Note (Signed)
She has significant nasal congestion She is not interested to pursue any form of treatment right now

## 2020-02-20 NOTE — Assessment & Plan Note (Signed)
Her blood pressure is stable Monitor closely 

## 2020-02-21 ENCOUNTER — Ambulatory Visit: Payer: Medicare HMO

## 2020-02-21 ENCOUNTER — Other Ambulatory Visit: Payer: Self-pay

## 2020-02-21 VITALS — BP 155/108 | HR 94

## 2020-02-21 DIAGNOSIS — Z483 Aftercare following surgery for neoplasm: Secondary | ICD-10-CM

## 2020-02-21 DIAGNOSIS — R2689 Other abnormalities of gait and mobility: Secondary | ICD-10-CM | POA: Diagnosis not present

## 2020-02-21 NOTE — Therapy (Signed)
[place], [place], [address] Phone: [phone]   Fax:  [phone]  Physical Therapy Treatment  Patient Details  Name: Kristin Webb MRN: [id number] Date of Birth: 1946-04-09 Referring Provider (PT): Dr Alvy Bimler   Encounter Date: 02/21/2020   PT End of Session - 02/21/20 0931    Visit Number 6    Number of Visits 12    Date for PT Re-Evaluation 03/13/20    Authorization Type Approved 12 visits from 6/23-8/4    Authorization - Visit Number 6    Authorization - Number of Visits 12    PT Start Time 0904    PT Stop Time 0958    PT Time Calculation (min) 54 min    Activity Tolerance Treatment limited secondary to medical complications (Comment);Patient tolerated treatment well;Other (comment)   multiple rest breaks due to high BP today   Behavior During Therapy [facility] for tasks assessed/performed           Past Medical History:  Diagnosis Date  . Burning mouth syndrome   . Chronic fatigue   . Constipation   . Diabetes ([place]) 05/09/2018  . Diarrhea    in the past after gallbladder removal  . Fibromyalgia   . GERD (gastroesophageal reflux disease)   . History of kidney stones 07/2015  . Hyperlipidemia   . Hypertension    borderline not on meds   . Insomnia secondary to depression with anxiety   . Lumbar disc disease   . Ovarian cancer ([place]) 2014/2020   met nodule in abd  . Pelvic mass in female   . Pneumonia    hx of pneumonia as an infant  . PONV (postoperative nausea and vomiting)    pain from gas hernia 2017  . Shortness of breath    with exertion   . Sleep apnea    uses CPAP  . Yeast infection     Past Surgical History:  Procedure Laterality Date  . ABDOMINAL HYSTERECTOMY     early 30s  . APPENDECTOMY     WITH DEBULKING/BSO  . CHOLECYSTECTOMY     early 31s  . CYSTOSCOPY W/ URETERAL STENT PLACEMENT Left 07/09/2015   DUE TO NEPHROLITHIASIS Procedure: CYSTOSCOPY WITH LEFT  RETROGRADE PYELOGRAM/ LEFT URETERAL STENT PLACEMENT;  Surgeon: Ardis Hughs, MD;  Location: [facility];  Service: Urology;  Laterality: Left;  . HERNIA REPAIR    . INCISIONAL HERNIA REPAIR N/A 12/25/2015   WITH MESH Procedure:  INCISIONAL HERNIA REPAIR;  Surgeon: Ralene Ok, MD;  [facility];  Service: General;  Laterality: N/A;  . INCISIONAL HERNIA REPAIR N/A 10/21/2018   Procedure: LAPAROSCOPIC INCISIONAL HERNIA REPAIR WITH MESH;  Surgeon: Ralene Ok, MD;  Location: [place];  Service: General;  Laterality: N/A;  . INSERTION OF MESH N/A 12/25/2015   Procedure: INSERTION OF MESH;  Surgeon: Ralene Ok, MD;  [facility];  Service: General;  Laterality: N/A;  . LAPAROTOMY Bilateral 11/08/2012   Procedure: EXPLORATORY LAPAROTOMY BILATERAL SALPINGO OOPHORECTOMY TUMOR DEBULKING ;  Surgeon: Imagene Gurney A. Alycia Rossetti, MD;  [facility];  Service: Gynecology;  Laterality: Bilateral;  APPENDECTOMY / OMENTECTOMY  . LAPAROTOMY N/A 12/25/2015   Procedure: EXPLORATORY LAPAROTOMY;  Surgeon: Ralene Ok, MD;  [facility];  Service: General;  Laterality: N/A;  . LYSIS OF ADHESION N/A 12/25/2015   Procedure: LYSIS OF ADHESION;  Surgeon: Ralene Ok, MD;  [facility];  Service: General;  Laterality: N/A;  . PARTIAL HYSTERECTOMY  2006  .  TRIGGER FINGER RELEASE      Vitals:   02/21/20 0914  BP: (!) 155/108  Pulse: 94     Subjective Assessment - 02/21/20 0906    Subjective Dr. Alvy Bimler did change my BP and I think it's still a little high but she's happy with it. I'll see her again in 3 weeks for a reassess with the new BP dosage change. It was a little high this morning (147/104) but that was before I took my pill. I started Avastin (?) and my pain has been much better, but my dizziness is worse now making my balance worse now.    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  Recurrence found in  2019 with new nodules in the left lower quadrant and colon.  Just completed 6 sessions of chemotherapy and now on maintenance infusions every 3 weeks.  Fibromyalgia, Lumbar DDD, DM    Patient Stated Goals I'd like to stop bumping in to things.    Currently in Pain? No/denies                             [facility] Adult PT Treatment/Exercise - 02/21/20 0001      Neuro Re-ed    Neuro Re-ed Details  In // bars: Front and retro tandem walking, then slow, controlled high knee marching gliding 1 finger on eachside on bars, [place] 1x each way with VCs during for correct foot placement/technique, then seated rest break and BP check at 144/117, HR 91 BPM; then back on CR side: At back of bike: foam march x30 sec; bil sidestepping on/off Airex x10 each with no HHA; tilt board front-back and side-side x1 min each, then on Airex x1:30 min each with finger on back of bike, seated rest      Knee/Hip Exercises: Aerobic   Nustep Level 3, 3 mins, then rechecked BP: Prior to starting 151/109 and HR 94 BPM ; then 161/97 and HR 87 BPM. Stopped at this point as pt was feeling fatigued along with increased BP       Knee/Hip Exercises: Standing   Forward Step Up Right;Left;10 reps;Hand Hold: 1;Step Height: 6"    Forward Step Up Limitations At back of bike and 3 sec SLS with each rep                       PT Long Term Goals - 01/31/20 0859      PT LONG TERM GOAL #1   Title Pt will improve [place] ABS to 30/40    Baseline 26    Time 6    Period Weeks    Status New      PT LONG TERM GOAL #2   Title Pt will demonstrate single leg stance time of 10 seconds on the left LE    Baseline 4 seconds    Time 6    Period Weeks    Status New      PT LONG TERM GOAL #3   Title Pt will report no worsening of fatigue by the end of the day    Time 6    Period Weeks    Status New      PT LONG TERM GOAL #4   Title Pt will be ind with final HEP    Time 6    Period Weeks    Status New  Plan - 02/21/20 1001    Clinical Impression Statement Pt saw Dr. Alvy Bimler and her BP med dosage has been adjusted. But it was still a little high for her normal today so rechecked periodically during session today and took more rest breaks allowing for recovery periods. Overall she tolerated session well despite this and only time she didn't feel well was at end of NuStep but quickly recovered with seated rest. She is to cont monitoring her BP and keeping journal (she added BP #'s taken today as well) and report this to Dr. Alvy Bimler at next appt in 3 weeks. She was more unsteady today with activity but reports noticing this with new med, however her body pain is much improved.    Personal Factors and Comorbidities Age;Fitness;Comorbidity 1    Comorbidities chemotherapy    Examination-Activity Limitations Locomotion Level    Examination-Participation Restrictions Yard Work;Community Activity    Stability/Clinical Decision Making Evolving/Moderate complexity    Rehab Potential Good    PT Frequency 2x / week    PT Duration 6 weeks    PT Treatment/Interventions ADLs/Self Care Home Management;Gait training;Therapeutic exercise;Patient/family education;Manual techniques;Neuromuscular re-education    PT Next Visit Plan how is BP at home (pt checks every day) monitor in clinic if continues to be high.  Cont nustep(assessing BP to determine of ok to do) and balance to include foam work, single leg and left LE acceptance activities, and activities when walking, include head turns    PT Home Exercise Plan Straight leg raises in standing with theraband    Consulted and Agree with Plan of Care Patient           Patient will benefit from skilled therapeutic intervention in order to improve the following deficits and impairments:  Decreased activity tolerance, Difficulty walking, Decreased balance, Postural dysfunction, Impaired flexibility, Decreased coordination, Decreased  strength  Visit Diagnosis: Other abnormalities of gait and mobility  Aftercare following surgery for neoplasm     Problem List Patient Active Problem List   Diagnosis Date Noted  . Balance problems 01/29/2020  . Sinus congestion 01/29/2020  . Bilateral cataracts 12/12/2019  . UTI (urinary tract infection) 11/22/2019  . Dysuria 11/20/2019  . 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 Thrombocytopenia ([place]) 09/08/2019  . Fibromyalgia 09/08/2019  . Goals of care, counseling/discussion 08/08/2019  . Excessive body weight gain 05/09/2018  . Neoplastic malignant related fatigue 05/09/2018  . OSA on CPAP 05/09/2018  . Newly diagnosed diabetes ([place]) 05/09/2018  . Hypercalcemia 07/21/2016  . Other fatigue 07/15/2016  . Excessive daytime sleepiness 05/18/2016  . Abdominal wall hernia 05/18/2016  . Activity intolerance related to fatigue 05/18/2016  . S/P hernia repair 12/25/2015  . BRCA negative 09/25/2015  . Pyelonephritis 07/09/2015  . Nephrolithiasis 07/09/2015  . Insomnia secondary to depression with anxiety 12/24/2014  . Gastritis 12/20/2014  . Port catheter in place 12/20/2014  . Insomnia 12/12/2013  . Obesity (BMI 35.0-39.9 without comorbidity) 12/12/2013  . OSA (obstructive sleep apnea) 11/24/2013  . Burning mouth syndrome 11/24/2013  . Ventral hernia 04/18/2013  . Essential hypertension 04/18/2013  . 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

## 2020-02-23 ENCOUNTER — Other Ambulatory Visit: Payer: Self-pay

## 2020-02-23 ENCOUNTER — Ambulatory Visit: Payer: Medicare HMO

## 2020-02-23 VITALS — BP 165/90 | HR 86

## 2020-02-23 DIAGNOSIS — R2689 Other abnormalities of gait and mobility: Secondary | ICD-10-CM

## 2020-02-23 DIAGNOSIS — Z483 Aftercare following surgery for neoplasm: Secondary | ICD-10-CM

## 2020-02-23 NOTE — Therapy (Signed)
[place], [place], [address] Phone: [phone]   Fax:  [phone]  Physical Therapy Treatment  Patient Details  Name: Kristin Webb MRN: [id number] Date of Birth: 03-14-1946 Referring Provider (PT): Dr Alvy Bimler   Encounter Date: 02/23/2020   PT End of Session - 02/23/20 0858    Visit Number 7    Number of Visits 12    Date for PT Re-Evaluation 03/13/20    Authorization Type Approved 12 visits from 6/23-8/4    Authorization - Visit Number 7    Authorization - Number of Visits 12    PT Start Time 0805    PT Stop Time 0852   pt requested ending early   PT Time Calculation (min) 47 min    Activity Tolerance Treatment limited secondary to medical complications (Comment);Patient tolerated treatment well;Other (comment)    Behavior During Therapy [facility] for tasks assessed/performed           Past Medical History:  Diagnosis Date  . Burning mouth syndrome   . Chronic fatigue   . Constipation   . Diabetes ([place]) 05/09/2018  . Diarrhea    in the past after gallbladder removal  . Fibromyalgia   . GERD (gastroesophageal reflux disease)   . History of kidney stones 07/2015  . Hyperlipidemia   . Hypertension    borderline not on meds   . Insomnia secondary to depression with anxiety   . Lumbar disc disease   . Ovarian cancer ([place]) 2014/2020   met nodule in abd  . Pelvic mass in female   . Pneumonia    hx of pneumonia as an infant  . PONV (postoperative nausea and vomiting)    pain from gas hernia 2017  . Shortness of breath    with exertion   . Sleep apnea    uses CPAP  . Yeast infection     Past Surgical History:  Procedure Laterality Date  . ABDOMINAL HYSTERECTOMY     early 74s  . APPENDECTOMY     WITH DEBULKING/BSO  . CHOLECYSTECTOMY     early 55s  . CYSTOSCOPY W/ URETERAL STENT PLACEMENT Left 07/09/2015   DUE TO NEPHROLITHIASIS Procedure: CYSTOSCOPY WITH LEFT RETROGRADE  PYELOGRAM/ LEFT URETERAL STENT PLACEMENT;  Surgeon: Ardis Hughs, MD;  [facility];  Service: Urology;  Laterality: Left;  . HERNIA REPAIR    . INCISIONAL HERNIA REPAIR N/A 12/25/2015   WITH MESH Procedure:  INCISIONAL HERNIA REPAIR;  Surgeon: Ralene Ok, MD;  [facility];  Service: General;  Laterality: N/A;  . INCISIONAL HERNIA REPAIR N/A 10/21/2018   Procedure: LAPAROSCOPIC INCISIONAL HERNIA REPAIR WITH MESH;  Surgeon: Ralene Ok, MD;  Location: [place];  Service: General;  Laterality: N/A;  . INSERTION OF MESH N/A 12/25/2015   Procedure: INSERTION OF MESH;  Surgeon: Ralene Ok, MD;  [facility];  Service: General;  Laterality: N/A;  . LAPAROTOMY Bilateral 11/08/2012   Procedure: EXPLORATORY LAPAROTOMY BILATERAL SALPINGO OOPHORECTOMY TUMOR DEBULKING ;  Surgeon: Imagene Gurney A. Alycia Rossetti, MD;  [facility];  Service: Gynecology;  Laterality: Bilateral;  APPENDECTOMY / OMENTECTOMY  . LAPAROTOMY N/A 12/25/2015   Procedure: EXPLORATORY LAPAROTOMY;  Surgeon: Ralene Ok, MD;  [facility];  Service: General;  Laterality: N/A;  . LYSIS OF ADHESION N/A 12/25/2015   Procedure: LYSIS OF ADHESION;  Surgeon: Ralene Ok, MD;  [facility];  Service: General;  Laterality: N/A;  . PARTIAL HYSTERECTOMY  2006  . TRIGGER FINGER RELEASE  Vitals:   02/23/20 0809  BP: (!) 165/90  Pulse: 86     Subjective Assessment - 02/23/20 0809    Subjective I felt good after last session. My BP 147/97 that afternoon, so it had lowered some.    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  Recurrence found in 2019 with new nodules in the left lower quadrant and colon.  Just completed 6 sessions of chemotherapy and now on maintenance infusions every 3 weeks.  Fibromyalgia, Lumbar DDD, DM    Patient Stated Goals I'd like to stop bumping in to things.    Currently in Pain? No/denies                               [facility] Adult PT Treatment/Exercise - 02/23/20 0001      Neuro Re-ed    Neuro Re-ed Details  In // bars with 2 lbs on each ankle: Front and retro tandem walking, then slow, controlled high knee marching gliding 1 finger on eachside on bars, and bil sidestepping 1x each way with high stepping; seated rest break but with LE exs; then on Airex for hip 3 way raises with pt returning therapist demo and VCs during for correct technique/posture then after walking back to CR side BP 161/102 and HR 90 bpm      Knee/Hip Exercises: Aerobic   Nustep Level 3, 4 mins, then rechecked BP after 167/90 and HR 83 bpm      Knee/Hip Exercises: Standing   Forward Step Up Right;Left;10 reps;Hand Hold: 1;Step Height: 4"   with Airex on step    Forward Step Up Limitations At back of bike      Knee/Hip Exercises: Seated   [place] Quad Strengthening;Right;Left;10 reps;Weights    [place] Quad Weight 2 lbs.    Long Arc Quad Limitations Pt returned therapist demo then VCs for slow, controlled pace    Marching Strengthening;Right;Left;10 reps;Weights    Marching Limitations Pt returned therapist Visual merchandiser Weights 2 lbs.                       PT Long Term Goals - 01/31/20 0859      PT LONG TERM GOAL #1   Title Pt will improve [place] ABS to 30/40    Baseline 26    Time 6    Period Weeks    Status New      PT LONG TERM GOAL #2   Title Pt will demonstrate single leg stance time of 10 seconds on the left LE    Baseline 4 seconds    Time 6    Period Weeks    Status New      PT LONG TERM GOAL #3   Title Pt will report no worsening of fatigue by the end of the day    Time 6    Period Weeks    Status New      PT LONG TERM GOAL #4   Title Pt will be ind with final HEP    Time 6    Period Weeks    Status New                 Plan - 02/23/20 0900    Clinical Impression Statement Continued with bil LE strength and balance activities. Her  systolic BP was still  slightly elevated into the lower 160's (normal for her is higher 140's) but she reports feeling okay during session. Did add 2 lb to ankles for most of duration of session and this seemed to fatigue pt quicker as she requested ending a little early today. Still progress noted though as she was able to tolerate the weights and extra minute on NuStep today as well with less seated rest breaks.    Personal Factors and Comorbidities Age;Fitness;Comorbidity 1    Comorbidities chemotherapy    Examination-Activity Limitations Locomotion Level    Examination-Participation Restrictions Yard Work;Community Activity    Stability/Clinical Decision Making Evolving/Moderate complexity    Rehab Potential Good    PT Frequency 2x / week    PT Duration 6 weeks    PT Treatment/Interventions ADLs/Self Care Home Management;Gait training;Therapeutic exercise;Patient/family education;Manual techniques;Neuromuscular re-education    PT Next Visit Plan how is BP at home (pt checks every day) monitor in clinic if continues to be high.  Cont nustep(assessing BP to determine of ok to do) and balance to include foam work, single leg and left LE acceptance activities, and activities when walking, include head turns    PT Home Exercise Plan Straight leg raises in standing with theraband    Consulted and Agree with Plan of Care Patient           Patient will benefit from skilled therapeutic intervention in order to improve the following deficits and impairments:  Decreased activity tolerance, Difficulty walking, Decreased balance, Postural dysfunction, Impaired flexibility, Decreased coordination, Decreased strength  Visit Diagnosis: Other abnormalities of gait and mobility  Aftercare following surgery for neoplasm     Problem List Patient Active Problem List   Diagnosis Date Noted  . Balance problems 01/29/2020  . Sinus congestion 01/29/2020  . Bilateral cataracts 12/12/2019  . UTI (urinary  tract infection) 11/22/2019  . Dysuria 11/20/2019  . 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 Thrombocytopenia ([place]) 09/08/2019  . Fibromyalgia 09/08/2019  . Goals of care, counseling/discussion 08/08/2019  . Excessive body weight gain 05/09/2018  . Neoplastic malignant related fatigue 05/09/2018  . OSA on CPAP 05/09/2018  . Newly diagnosed diabetes ([place]) 05/09/2018  . Hypercalcemia 07/21/2016  . Other fatigue 07/15/2016  . Excessive daytime sleepiness 05/18/2016  . Abdominal wall hernia 05/18/2016  . Activity intolerance related to fatigue 05/18/2016  . S/P hernia repair 12/25/2015  . BRCA negative 09/25/2015  . Pyelonephritis 07/09/2015  . Nephrolithiasis 07/09/2015  . Insomnia secondary to depression with anxiety 12/24/2014  . Gastritis 12/20/2014  . Port catheter in place 12/20/2014  . Insomnia 12/12/2013  . Obesity (BMI 35.0-39.9 without comorbidity) 12/12/2013  . OSA (obstructive sleep apnea) 11/24/2013  . Burning mouth syndrome 11/24/2013  . Ventral hernia 04/18/2013  . Essential hypertension 04/18/2013  . 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

## 2020-02-26 ENCOUNTER — Ambulatory Visit: Payer: Medicare HMO

## 2020-02-26 ENCOUNTER — Other Ambulatory Visit: Payer: Self-pay

## 2020-02-26 ENCOUNTER — Telehealth: Payer: Self-pay

## 2020-02-26 VITALS — BP 186/103 | HR 85

## 2020-02-26 DIAGNOSIS — R2689 Other abnormalities of gait and mobility: Secondary | ICD-10-CM

## 2020-02-26 DIAGNOSIS — Z483 Aftercare following surgery for neoplasm: Secondary | ICD-10-CM | POA: Diagnosis not present

## 2020-02-26 MED ORDER — METOPROLOL TARTRATE 25 MG PO TABS: 25.0000 mg | ORAL_TABLET | Freq: Two times a day (BID) | ORAL | 3 refills | Status: AC

## 2020-02-26 NOTE — Telephone Encounter (Signed)
-----   Message from [name], MD sent at 02/26/2020  8:43 AM EDT ----- Regarding: high BP PT called, patient complained of high BP I recommend adding metoprolol BID Can you call in 25 mg BID to her local pharmacy? 60 tabs, 3 refills

## 2020-02-26 NOTE — Therapy (Signed)
[place], [place], [address] Phone: [phone]   Fax:  [phone]  Physical Therapy Treatment  Patient Details  Name: Kristin Webb MRN: [id number] Date of Birth: Dec 15, 1945 Referring Provider (PT): Dr Alvy Bimler   Encounter Date: 02/26/2020   PT End of Session - 02/26/20 0843    Visit Number 8    Number of Visits 12    Date for PT Re-Evaluation 03/13/20    Authorization Type Approved 12 visits from 6/23-8/4    Authorization - Visit Number 8    Authorization - Number of Visits 12    PT Start Time 0805    PT Stop Time 0842    PT Time Calculation (min) 37 min    Activity Tolerance Treatment limited secondary to medical complications (Comment)    Behavior During Therapy [facility] for tasks assessed/performed           Past Medical History:  Diagnosis Date  . Burning mouth syndrome   . Chronic fatigue   . Constipation   . Diabetes ([place]) 05/09/2018  . Diarrhea    in the past after gallbladder removal  . Fibromyalgia   . GERD (gastroesophageal reflux disease)   . History of kidney stones 07/2015  . Hyperlipidemia   . Hypertension    borderline not on meds   . Insomnia secondary to depression with anxiety   . Lumbar disc disease   . Ovarian cancer ([place]) 2014/2020   met nodule in abd  . Pelvic mass in female   . Pneumonia    hx of pneumonia as an infant  . PONV (postoperative nausea and vomiting)    pain from gas hernia 2017  . Shortness of breath    with exertion   . Sleep apnea    uses CPAP  . Yeast infection     Past Surgical History:  Procedure Laterality Date  . ABDOMINAL HYSTERECTOMY     early 95s  . APPENDECTOMY     WITH DEBULKING/BSO  . CHOLECYSTECTOMY     early 59s  . CYSTOSCOPY W/ URETERAL STENT PLACEMENT Left 07/09/2015   DUE TO NEPHROLITHIASIS Procedure: CYSTOSCOPY WITH LEFT RETROGRADE PYELOGRAM/ LEFT URETERAL STENT PLACEMENT;  Surgeon: Ardis Hughs, MD;   [facility];  Service: Urology;  Laterality: Left;  . HERNIA REPAIR    . INCISIONAL HERNIA REPAIR N/A 12/25/2015   WITH MESH Procedure:  INCISIONAL HERNIA REPAIR;  Surgeon: Ralene Ok, MD;  Location[facility];  Service: General;  Laterality: N/A;  . INCISIONAL HERNIA REPAIR N/A 10/21/2018   Procedure: LAPAROSCOPIC INCISIONAL HERNIA REPAIR WITH MESH;  Surgeon: Ralene Ok, MD;  Location: [place];  Service: General;  Laterality: N/A;  . INSERTION OF MESH N/A 12/25/2015   Procedure: INSERTION OF MESH;  Surgeon: Ralene Ok, MD;  [facility];  Service: General;  Laterality: N/A;  . LAPAROTOMY Bilateral 11/08/2012   Procedure: EXPLORATORY LAPAROTOMY BILATERAL SALPINGO OOPHORECTOMY TUMOR DEBULKING ;  Surgeon: Imagene Gurney A. Alycia Rossetti, MD;  [facility];  Service: Gynecology;  Laterality: Bilateral;  APPENDECTOMY / OMENTECTOMY  . LAPAROTOMY N/A 12/25/2015   Procedure: EXPLORATORY LAPAROTOMY;  Surgeon: Ralene Ok, MD;  [facility];  Service: General;  Laterality: N/A;  . LYSIS OF ADHESION N/A 12/25/2015   Procedure: LYSIS OF ADHESION;  Surgeon: Ralene Ok, MD;  [facility];  Service: General;  Laterality: N/A;  . PARTIAL HYSTERECTOMY  2006  . TRIGGER FINGER RELEASE      Vitals:   02/26/20  0998  BP: (!) 186/103  Pulse: 85     Subjective Assessment - 02/26/20 0810    Subjective My BP was good over the weekend and this morning (Last night 142/89 and down to 131/82 at 0500 right after I woke up)  but I'm sure it's high this morning from driving over here in the rain (. I've been feeling good after each session, I can tell I've worked out though.    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  Recurrence found in 2019 with new nodules in the left lower quadrant and colon.  Just completed 6 sessions of chemotherapy and now on maintenance infusions every 3 weeks.  Fibromyalgia, Lumbar DDD,  DM    Patient Stated Goals I'd like to stop bumping in to things.    Currently in Pain? No/denies                                          PT Long Term Goals - 01/31/20 0859      PT LONG TERM GOAL #1   Title Pt will improve [place] ABS to 30/40    Baseline 26    Time 6    Period Weeks    Status New      PT LONG TERM GOAL #2   Title Pt will demonstrate single leg stance time of 10 seconds on the left LE    Baseline 4 seconds    Time 6    Period Weeks    Status New      PT LONG TERM GOAL #3   Title Pt will report no worsening of fatigue by the end of the day    Time 6    Period Weeks    Status New      PT LONG TERM GOAL #4   Title Pt will be ind with final HEP    Time 6    Period Weeks    Status New                 Plan - 02/26/20 3382    Clinical Impression Statement Upon arrival assessed pts BP as we have been doing each session since she is being medicated for this currently and Dr. Alvy Bimler recently increased her dosage. Her BP was muc increased from normal (see vitals as all 3 taken today are documented there). Pt did not have any other symptoms to report but as this stayed high with second reading as well about 10-15 mins later opted to hold treatment today so as not to increase her BP any further. Also inboxed message Dr. Alvy Bimler who responded that they would take care of it and this message was relayed to pt that Dr. Alvy Bimler got our message and is now aware.    Personal Factors and Comorbidities Age;Fitness;Comorbidity 1    Comorbidities chemotherapy    Examination-Activity Limitations Locomotion Level    Examination-Participation Restrictions Yard Work;Community Activity    Stability/Clinical Decision Making Evolving/Moderate complexity    Rehab Potential Good    PT Frequency 2x / week    PT Duration 6 weeks    PT Treatment/Interventions ADLs/Self Care Home Management;Gait training;Therapeutic exercise;Patient/family  education;Manual techniques;Neuromuscular re-education    PT Next Visit Plan Any changes from Dr. Alvy Bimler regarding BP? how is BP at home (pt checks every day) monitor  in clinic as this continues to be high.  Cont nustep(assessing BP to determine of ok to do) and balance to include foam work, single leg and left LE acceptance activities, and activities when walking, include head turns    PT Home Exercise Plan Straight leg raises in standing with theraband    Consulted and Agree with Plan of Care Patient           Patient will benefit from skilled therapeutic intervention in order to improve the following deficits and impairments:  Decreased activity tolerance, Difficulty walking, Decreased balance, Postural dysfunction, Impaired flexibility, Decreased coordination, Decreased strength  Visit Diagnosis: Other abnormalities of gait and mobility  Aftercare following surgery for neoplasm     Problem List Patient Active Problem List   Diagnosis Date Noted  . Balance problems 01/29/2020  . Sinus congestion 01/29/2020  . Bilateral cataracts 12/12/2019  . UTI (urinary tract infection) 11/22/2019  . Dysuria 11/20/2019  . 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 Thrombocytopenia ([place]) 09/08/2019  . Fibromyalgia 09/08/2019  . Goals of care, counseling/discussion 08/08/2019  . Excessive body weight gain 05/09/2018  . Neoplastic malignant related fatigue 05/09/2018  . OSA on CPAP 05/09/2018  . Newly diagnosed diabetes ([place]) 05/09/2018  . Hypercalcemia 07/21/2016  . Other fatigue 07/15/2016  . Excessive daytime sleepiness 05/18/2016  . Abdominal wall hernia 05/18/2016  . Activity intolerance related to fatigue 05/18/2016  . S/P hernia repair 12/25/2015  . BRCA negative 09/25/2015  . Pyelonephritis 07/09/2015  . Nephrolithiasis 07/09/2015  . Insomnia secondary to depression with anxiety 12/24/2014  . Gastritis 12/20/2014  . Port catheter in place 12/20/2014  .  Insomnia 12/12/2013  . Obesity (BMI 35.0-39.9 without comorbidity) 12/12/2013  . OSA (obstructive sleep apnea) 11/24/2013  . Burning mouth syndrome 11/24/2013  . Ventral hernia 04/18/2013  . Essential hypertension 04/18/2013  . 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

## 2020-02-26 NOTE — Telephone Encounter (Signed)
Called and given below message. She verbalized understanding. PT was canceled today. BP came down to 154/88 before leaving cancer rehab. BP was up 186/103, then 171/102. Just FYI

## 2020-02-28 ENCOUNTER — Ambulatory Visit: Payer: Medicare HMO

## 2020-02-28 ENCOUNTER — Other Ambulatory Visit: Payer: Self-pay

## 2020-02-28 VITALS — BP 162/96 | HR 62

## 2020-02-28 DIAGNOSIS — Z483 Aftercare following surgery for neoplasm: Secondary | ICD-10-CM | POA: Diagnosis not present

## 2020-02-28 DIAGNOSIS — R2689 Other abnormalities of gait and mobility: Secondary | ICD-10-CM

## 2020-02-28 NOTE — Therapy (Signed)
[place], [place], [address] Phone: [phone]   Fax:  [phone]  Physical Therapy Treatment  Patient Details  Name: Kristin Webb MRN: [id number] Date of Birth: 22-Sep-1945 Referring Provider (PT): Dr Alvy Bimler   Encounter Date: 02/28/2020   PT End of Session - 02/28/20 0848    Visit Number 9    Number of Visits 12    Date for PT Re-Evaluation 03/13/20    Authorization Type Approved 12 visits from 6/23-8/4    Authorization - Visit Number 9    Authorization - Number of Visits 12    PT Start Time 0802    PT Stop Time 0254    PT Time Calculation (min) 56 min    Activity Tolerance Patient tolerated treatment well    Behavior During Therapy [facility] for tasks assessed/performed           Past Medical History:  Diagnosis Date  . Burning mouth syndrome   . Chronic fatigue   . Constipation   . Diabetes ([place]) 05/09/2018  . Diarrhea    in the past after gallbladder removal  . Fibromyalgia   . GERD (gastroesophageal reflux disease)   . History of kidney stones 07/2015  . Hyperlipidemia   . Hypertension    borderline not on meds   . Insomnia secondary to depression with anxiety   . Lumbar disc disease   . Ovarian cancer ([place]) 2014/2020   met nodule in abd  . Pelvic mass in female   . Pneumonia    hx of pneumonia as an infant  . PONV (postoperative nausea and vomiting)    pain from gas hernia 2017  . Shortness of breath    with exertion   . Sleep apnea    uses CPAP  . Yeast infection     Past Surgical History:  Procedure Laterality Date  . ABDOMINAL HYSTERECTOMY     early 61s  . APPENDECTOMY     WITH DEBULKING/BSO  . CHOLECYSTECTOMY     early 5s  . CYSTOSCOPY W/ URETERAL STENT PLACEMENT Left 07/09/2015   DUE TO NEPHROLITHIASIS Procedure: CYSTOSCOPY WITH LEFT RETROGRADE PYELOGRAM/ LEFT URETERAL STENT PLACEMENT;  Surgeon: Ardis Hughs, MD;  [facility];  Service: Urology;   Laterality: Left;  . HERNIA REPAIR    . INCISIONAL HERNIA REPAIR N/A 12/25/2015   WITH MESH Procedure:  INCISIONAL HERNIA REPAIR;  Surgeon: Ralene Ok, MD;  [facility];  Service: General;  Laterality: N/A;  . INCISIONAL HERNIA REPAIR N/A 10/21/2018   Procedure: LAPAROSCOPIC INCISIONAL HERNIA REPAIR WITH MESH;  Surgeon: Ralene Ok, MD;  Location: [place];  Service: General;  Laterality: N/A;  . INSERTION OF MESH N/A 12/25/2015   Procedure: INSERTION OF MESH;  Surgeon: Ralene Ok, MD;  [facility];  Service: General;  Laterality: N/A;  . LAPAROTOMY Bilateral 11/08/2012   Procedure: EXPLORATORY LAPAROTOMY BILATERAL SALPINGO OOPHORECTOMY TUMOR DEBULKING ;  Surgeon: Imagene Gurney A. Alycia Rossetti, MD;  [facility];  Service: Gynecology;  Laterality: Bilateral;  APPENDECTOMY / OMENTECTOMY  . LAPAROTOMY N/A 12/25/2015   Procedure: EXPLORATORY LAPAROTOMY;  Surgeon: Ralene Ok, MD;  [facility];  Service: General;  Laterality: N/A;  . LYSIS OF ADHESION N/A 12/25/2015   Procedure: LYSIS OF ADHESION;  Surgeon: Ralene Ok, MD;  [facility];  Service: General;  Laterality: N/A;  . PARTIAL HYSTERECTOMY  2006  . TRIGGER FINGER RELEASE      Vitals:   02/28/20 0809  BP: Marland Kitchen)  162/96  Pulse: 62     Subjective Assessment - 02/28/20 0806    Subjective My BP stayed slightly elevated Monday but not as high as it was here. Dr. Alvy Bimler did change my medication and I just started the new medicine this morning but I don't know the name, I'll bring it next time. So she is replacing the 2 meds she had put me on for this new one. This morning I felt a little off balance but my BP was 162/96.    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  Recurrence found in 2019 with new nodules in the left lower quadrant and colon.  Just completed 6 sessions of chemotherapy and now on maintenance infusions every 3 weeks.   Fibromyalgia, Lumbar DDD, DM    Patient Stated Goals I'd like to stop bumping in to things.    Currently in Pain? No/denies                             [facility] Adult PT Treatment/Exercise - 02/28/20 0001      Neuro Re-ed    Neuro Re-ed Details  In // bars: Front (4x) and retro (2x) tandem walking, then slow, controlled high knee marching with no HHA, seated rest break; then on Airex for hip 3 way raises 2x15 with 2 lbs each ankle with pt returning therapist demo, tactile and VCs for extension and only able to do 1x15 due to beginning to feel LE fatigue; then after walking back to CR side BP 174/100 and HR 68 bpm      Knee/Hip Exercises: Aerobic   Nustep Level 3, 7 mins with       Knee/Hip Exercises: Supine   Bridges Strengthening;Both;5 reps    Bridges Limitations Pt again with "catch" in Lt LE and unable to control descent after first 2 reps so stopped, then tried S/L clam    Straight Leg Raises Strengthening;Right;Left;2 sets;10 reps    Straight Leg Raises Limitations Tactile and VCs for core and quad engaged for proper technique; pt with "catch" with Lt LE eccentric contraction      Knee/Hip Exercises: Sidelying   Clams Rt S/L for Lt LE with cuing to limit ROM, 10x, pt still with "catch" in muscle but less so and she was able to stay in control of motion                       PT Long Term Goals - 01/31/20 0859      PT LONG TERM GOAL #1   Title Pt will improve [place] ABS to 30/40    Baseline 26    Time 6    Period Weeks    Status New      PT LONG TERM GOAL #2   Title Pt will demonstrate single leg stance time of 10 seconds on the left LE    Baseline 4 seconds    Time 6    Period Weeks    Status New      PT LONG TERM GOAL #3   Title Pt will report no worsening of fatigue by the end of the day    Time 6    Period Weeks    Status New      PT LONG TERM GOAL #4   Title Pt will be ind with final HEP    Time  [place] - 02/28/20 0849    Clinical Impression Statement Pts BP was not as high today as last session so was able to resume treatment. Pt was able to tolerate progression today with longer time on NuStep and increased reps with standing 3 way raises. Her BP was slightly elevated from beginning of session when retaken half way thru but this is to be expected with increased activity. Overall pt reports feeling fine throughout session except some dizziness like she had upon waking up this morning. Had also just come from supine-sit so position change may have contributed. Weakness noted with supine>S/L LE ex today with eccentric contractions. Encouraged pt to cont S/L clam over the weekend.    Personal Factors and Comorbidities Age;Fitness;Comorbidity 1    Comorbidities chemotherapy    Examination-Activity Limitations Locomotion Level    Examination-Participation Restrictions Yard Work;Community Activity    Stability/Clinical Decision Making Evolving/Moderate complexity    Rehab Potential Good    PT Frequency 2x / week    PT Duration 6 weeks    PT Treatment/Interventions ADLs/Self Care Home Management;Gait training;Therapeutic exercise;Patient/family education;Manual techniques;Neuromuscular re-education    PT Next Visit Plan Monitor BP in clinic as this continues to be high.  Cont nustep(assessing BP to determine of ok to do) and balance to include foam work, single leg and left LE acceptance activities, and activities when walking, include head turns    PT Home Exercise Plan Straight leg raises in standing with theraband    Consulted and Agree with Plan of Care Patient           Patient will benefit from skilled therapeutic intervention in order to improve the following deficits and impairments:  Decreased activity tolerance, Difficulty walking, Decreased balance, Postural dysfunction, Impaired flexibility, Decreased coordination, Decreased strength  Visit  Diagnosis: Other abnormalities of gait and mobility  Aftercare following surgery for neoplasm     Problem List Patient Active Problem List   Diagnosis Date Noted  . Balance problems 01/29/2020  . Sinus congestion 01/29/2020  . Bilateral cataracts 12/12/2019  . UTI (urinary tract infection) 11/22/2019  . Dysuria 11/20/2019  . 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 Thrombocytopenia ([place]) 09/08/2019  . Fibromyalgia 09/08/2019  . Goals of care, counseling/discussion 08/08/2019  . Excessive body weight gain 05/09/2018  . Neoplastic malignant related fatigue 05/09/2018  . OSA on CPAP 05/09/2018  . Newly diagnosed diabetes ([place]) 05/09/2018  . Hypercalcemia 07/21/2016  . Other fatigue 07/15/2016  . Excessive daytime sleepiness 05/18/2016  . Abdominal wall hernia 05/18/2016  . Activity intolerance related to fatigue 05/18/2016  . S/P hernia repair 12/25/2015  . BRCA negative 09/25/2015  . Pyelonephritis 07/09/2015  . Nephrolithiasis 07/09/2015  . Insomnia secondary to depression with anxiety 12/24/2014  . Gastritis 12/20/2014  . Port catheter in place 12/20/2014  . Insomnia 12/12/2013  . Obesity (BMI 35.0-39.9 without comorbidity) 12/12/2013  . OSA (obstructive sleep apnea) 11/24/2013  . Burning mouth syndrome 11/24/2013  . Ventral hernia 04/18/2013  . Essential hypertension 04/18/2013  . 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

## 2020-03-01 ENCOUNTER — Encounter: Payer: Self-pay | Admitting: Hematology and Oncology

## 2020-03-04 ENCOUNTER — Ambulatory Visit: Payer: Medicare HMO

## 2020-03-04 ENCOUNTER — Other Ambulatory Visit: Payer: Self-pay

## 2020-03-04 VITALS — BP 165/97 | HR 64

## 2020-03-04 DIAGNOSIS — E559 Vitamin D deficiency, unspecified: Secondary | ICD-10-CM | POA: Diagnosis not present

## 2020-03-04 DIAGNOSIS — R5383 Other fatigue: Secondary | ICD-10-CM | POA: Diagnosis not present

## 2020-03-04 DIAGNOSIS — E782 Mixed hyperlipidemia: Secondary | ICD-10-CM | POA: Diagnosis not present

## 2020-03-04 DIAGNOSIS — M159 Polyosteoarthritis, unspecified: Secondary | ICD-10-CM | POA: Diagnosis not present

## 2020-03-04 DIAGNOSIS — G47419 Narcolepsy without cataplexy: Secondary | ICD-10-CM | POA: Diagnosis not present

## 2020-03-04 DIAGNOSIS — R2689 Other abnormalities of gait and mobility: Secondary | ICD-10-CM

## 2020-03-04 DIAGNOSIS — Z483 Aftercare following surgery for neoplasm: Secondary | ICD-10-CM

## 2020-03-04 DIAGNOSIS — H04129 Dry eye syndrome of unspecified lacrimal gland: Secondary | ICD-10-CM | POA: Diagnosis not present

## 2020-03-04 DIAGNOSIS — E1159 Type 2 diabetes mellitus with other circulatory complications: Secondary | ICD-10-CM | POA: Diagnosis not present

## 2020-03-04 DIAGNOSIS — I10 Essential (primary) hypertension: Secondary | ICD-10-CM | POA: Diagnosis not present

## 2020-03-04 DIAGNOSIS — F419 Anxiety disorder, unspecified: Secondary | ICD-10-CM | POA: Diagnosis not present

## 2020-03-04 DIAGNOSIS — G4733 Obstructive sleep apnea (adult) (pediatric): Secondary | ICD-10-CM | POA: Diagnosis not present

## 2020-03-04 NOTE — Patient Instructions (Signed)
WALKING  Walking is a great form of exercise to increase your strength, endurance and overall fitness.  A walking program can help you start slowly and gradually build endurance as you go.  Everyone's ability is different, so each person's starting point will be different.  You do not have to follow them exactly.  The are just samples. You should simply find out what's right for you and stick to that program.   In the beginning, you'll start off walking 2-3 times a day for short distances.  As you get stronger, you'll be walking further at just 1-2 times per day.  A. You Can Walk For A Certain Length Of Time Each Day    Walk 5 minutes 3 times per day.  Increase 2 minutes every 2 days (3 times per day).  Work up to 25-30 minutes (1-2 times per day).   Example:   Day 1-2 5 minutes 3 times per day   Day 7-8 12 minutes 2-3 times per day   Day 13-14 25 minutes 1-2 times per day  B. You Can Walk For a Certain Distance Each Day     Distance can be substituted for time.    Example:   3 trips to mailbox (at road)   3 trips to corner of block   3 trips around the block  C. Go to local [employer] and use the track.    Walk for distance ____ around track  Or time ____ minutes  D. Walk ____ Jog ____ Run ___  Please only do the exercises that your therapist has initialed and dated  

## 2020-03-04 NOTE — Therapy (Addendum)
[place], [place], [address] Phone: [phone]   Fax:  [phone]  Physical Therapy Treatment  Patient Details  Name: Kristin Webb MRN: [id number] Date of Birth: 04/09/1946 Referring Provider (PT): Dr Alvy Bimler  Physical Therapy Progress Note  Dates of Reporting Period:01/29/20  to 03/04/20  Objective Reports of Subjective Statement: see below  Objective Measurements: see below  Goal Update: see below  Plan: see below.  Cont POC  Reason Skilled Services are Required: continued fatigue and decreased mobility    Encounter Date: 03/04/2020   PT End of Session - 03/04/20 1046    Visit Number 10    Number of Visits 12    Date for PT Re-Evaluation 03/13/20    Authorization Type Approved 12 visits from 6/23-8/4    Authorization - Visit Number 10    Authorization - Number of Visits 12    PT Start Time 1002    PT Stop Time 1049    PT Time Calculation (min) 47 min    Activity Tolerance Patient tolerated treatment well;Treatment limited secondary to medical complications (Comment)   ended session early due to high BP   Behavior During Therapy [facility] for tasks assessed/performed           Past Medical History:  Diagnosis Date  . Burning mouth syndrome   . Chronic fatigue   . Constipation   . Diabetes ([place]) 05/09/2018  . Diarrhea    in the past after gallbladder removal  . Fibromyalgia   . GERD (gastroesophageal reflux disease)   . History of kidney stones 07/2015  . Hyperlipidemia   . Hypertension    borderline not on meds   . Insomnia secondary to depression with anxiety   . Lumbar disc disease   . Ovarian cancer ([place]) 2014/2020   met nodule in abd  . Pelvic mass in female   . Pneumonia    hx of pneumonia as an infant  . PONV (postoperative nausea and vomiting)    pain from gas hernia 2017  . Shortness of breath    with exertion   . Sleep apnea    uses CPAP  . Yeast infection      Past Surgical History:  Procedure Laterality Date  . ABDOMINAL HYSTERECTOMY     early 12s  . APPENDECTOMY     WITH DEBULKING/BSO  . CHOLECYSTECTOMY     early 66s  . CYSTOSCOPY W/ URETERAL STENT PLACEMENT Left 07/09/2015   DUE TO NEPHROLITHIASIS Procedure: CYSTOSCOPY WITH LEFT RETROGRADE PYELOGRAM/ LEFT URETERAL STENT PLACEMENT;  Surgeon: Ardis Hughs, MD;  [facility];  Service: Urology;  Laterality: Left;  . HERNIA REPAIR    . INCISIONAL HERNIA REPAIR N/A 12/25/2015   WITH MESH Procedure:  INCISIONAL HERNIA REPAIR;  Surgeon: Ralene Ok, MD;  [facility];  Service: General;  Laterality: N/A;  . INCISIONAL HERNIA REPAIR N/A 10/21/2018   Procedure: LAPAROSCOPIC INCISIONAL HERNIA REPAIR WITH MESH;  Surgeon: Ralene Ok, MD;  Location: [place];  Service: General;  Laterality: N/A;  . INSERTION OF MESH N/A 12/25/2015   Procedure: INSERTION OF MESH;  Surgeon: Ralene Ok, MD;  [facility];  Service: General;  Laterality: N/A;  . LAPAROTOMY Bilateral 11/08/2012   Procedure: EXPLORATORY LAPAROTOMY BILATERAL SALPINGO OOPHORECTOMY TUMOR DEBULKING ;  Surgeon: Imagene Gurney A. Alycia Rossetti, MD;  [facility];  Service: Gynecology;  Laterality: Bilateral;  APPENDECTOMY / OMENTECTOMY  . LAPAROTOMY N/A 12/25/2015   Procedure: EXPLORATORY LAPAROTOMY;  Surgeon:  Ralene Ok, MD;  [facility];  Service: General;  Laterality: N/A;  . LYSIS OF ADHESION N/A 12/25/2015   Procedure: LYSIS OF ADHESION;  Surgeon: Ralene Ok, MD;  [facility];  Service: General;  Laterality: N/A;  . PARTIAL HYSTERECTOMY  2006  . TRIGGER FINGER RELEASE      Vitals:   03/04/20 1005  BP: (!) 165/97  Pulse: 64     Subjective Assessment - 03/04/20 1005    Subjective My BP seems to be not fluctuating quite as much as it was. But when it's higher it tends to be first thing in the morning and I'm not sure why. I see my PCP this afternoon so I'll ask. General fatigue is about the same but I  do notice I'm more aware with my balance.    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  Recurrence found in 2019 with new nodules in the left lower quadrant and colon.  Just completed 6 sessions of chemotherapy and now on maintenance infusions every 3 weeks.  Fibromyalgia, Lumbar DDD, DM    Patient Stated Goals I'd like to stop bumping in to things.    Currently in Pain? Yes    Pain Score 4     Pain Location Back    Pain Orientation Lower    Pain Descriptors / Indicators Dull    Pain Radiating Towards also muscle and joint pain in limbs    Pain Onset More than a month ago    Pain Frequency Intermittent    Aggravating Factors  fibromyalgia, but that pain is always there    Pain Relieving Factors good body mechanics and just taking my time                             [facility] Adult PT Treatment/Exercise - 03/04/20 0001      Self-Care   Self-Care Other Self-Care Comments    Other Self-Care Comments  At beginning of session while assessing pts BP also discussed beginning a daily walking routine to incorporate cardio into her ADLs; also to discuss this with her PCP at appt today on how to do this safely while monitoring BP to preventherself from overdoing. Pt verbalized understanding.       Neuro Re-ed    Neuro Re-ed Details  Walking along incline of black peds mat for front heel-toe walking with CGA-SBA throughout, and then slow, controlled high knee marching also with SBA throughout, 1x each way; pt had more difficulty with Rt side decline of slope, but overall was challenging and fatiguing for pt. Seated rest required after and BP at 188/103 and HR 71 bpm and pt reporting some mild dizziness      Knee/Hip Exercises: Aerobic   Nustep (arms at setting 8) Level 3, x8 mins with therapist present to monitor pt throughout                   PT Education - 03/04/20 1046    Education  Details Walking routine    Person(s) Educated Patient    Methods Explanation;Handout    Comprehension Verbalized understanding               PT Long Term Goals - 03/04/20 1013      PT LONG TERM GOAL #2   Title Pt will demonstrate single leg stance time of 10 seconds on the left  LE    Baseline 4 seconds; >12 sec with small LOB but able to self correct without putting foot down-03/04/20    Status Achieved      PT LONG TERM GOAL #3   Title Pt will report no worsening of fatigue by the end of the day    Baseline No change with this as of yet, but pt has also been dealing with changing dosage of BP which may affect this-03/04/20    Status On-going      PT LONG TERM GOAL #4   Title Pt will be ind with final HEP    Baseline Pt is independent with current HEP at this time - 03/04/20    Status Partially Met                 Plan - 03/04/20 1047    Clinical Impression Statement 10th visit note sent today. Pt continues to monitor her BP daily and reports it not fluctuating quite as much but still some. Today was high starting treatment and continued to monitor pt during session. She was able to tolerate 8 mins on Nustep then progressed challegning balance with walking along incline of mat. After less than 10 mins of this pt reports increased fatigue and some mild dizziness. Retook BP and this was elevated even more at 188/103 with HR of 71 bpm. Pt requested holding rest of treatment for today as normally when she feels this fatigued she rests at home. Did discuss with her trying to incoporate a daily walkoing routine as she isn't doing this as of yet. Issued handout with ideas of walking program whether up/down hallway multiple times or to mailbox or corner of sidewalk and beginning to do this consistently while monitoring BP after. Pt sees PCP today and plans to address all this then.    Personal Factors and Comorbidities Age;Fitness;Comorbidity 1    Comorbidities chemotherapy     Examination-Activity Limitations Locomotion Level    Examination-Participation Restrictions Yard Work;Community Activity    Stability/Clinical Decision Making Evolving/Moderate complexity    Rehab Potential Good    PT Frequency 2x / week    PT Duration 6 weeks    PT Treatment/Interventions ADLs/Self Care Home Management;Gait training;Therapeutic exercise;Patient/family education;Manual techniques;Neuromuscular re-education    PT Next Visit Plan Cont to monitor BP in clinic. See how appt with PCP went/any changes? Retest [place] ABS; Cont Nustep and balance work, walking along incline of black peds mat; also include foam and single leg with Lt acceptance activities and activities, like head turns, when walking.    PT Home Exercise Plan Straight leg raises in standing with theraband; begin daily walking routine    Consulted and Agree with Plan of Care Patient           Patient will benefit from skilled therapeutic intervention in order to improve the following deficits and impairments:  Decreased activity tolerance, Difficulty walking, Decreased balance, Postural dysfunction, Impaired flexibility, Decreased coordination, Decreased strength  Visit Diagnosis: Other abnormalities of gait and mobility  Aftercare following surgery for neoplasm     Problem List Patient Active Problem List   Diagnosis Date Noted  . Balance problems 01/29/2020  . Sinus congestion 01/29/2020  . Bilateral cataracts 12/12/2019  . UTI (urinary tract infection) 11/22/2019  . Dysuria 11/20/2019  . 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 Thrombocytopenia ([place]) 09/08/2019  . Fibromyalgia 09/08/2019  . Goals of care, counseling/discussion 08/08/2019  . Excessive body weight gain 05/09/2018  . Neoplastic malignant  related fatigue 05/09/2018  . OSA on CPAP 05/09/2018  . Newly diagnosed diabetes ([place]) 05/09/2018  . Hypercalcemia 07/21/2016  . Other fatigue 07/15/2016  . Excessive daytime  sleepiness 05/18/2016  . Abdominal wall hernia 05/18/2016  . Activity intolerance related to fatigue 05/18/2016  . S/P hernia repair 12/25/2015  . BRCA negative 09/25/2015  . Pyelonephritis 07/09/2015  . Nephrolithiasis 07/09/2015  . Insomnia secondary to depression with anxiety 12/24/2014  . Gastritis 12/20/2014  . Port catheter in place 12/20/2014  . Insomnia 12/12/2013  . Obesity (BMI 35.0-39.9 without comorbidity) 12/12/2013  . OSA (obstructive sleep apnea) 11/24/2013  . Burning mouth syndrome 11/24/2013  . Ventral hernia 04/18/2013  . Essential hypertension 04/18/2013  . 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, [place], [address] Phone: [phone]   Fax:  [phone]  Name: Kristin Webb MRN: [id number] Date of Birth: [birth year]

## 2020-03-07 ENCOUNTER — Encounter: Payer: Self-pay | Admitting: Rehabilitation

## 2020-03-07 ENCOUNTER — Other Ambulatory Visit: Payer: Self-pay

## 2020-03-07 ENCOUNTER — Ambulatory Visit: Payer: Medicare HMO | Admitting: Rehabilitation

## 2020-03-07 DIAGNOSIS — R2689 Other abnormalities of gait and mobility: Secondary | ICD-10-CM | POA: Diagnosis not present

## 2020-03-07 DIAGNOSIS — Z483 Aftercare following surgery for neoplasm: Secondary | ICD-10-CM | POA: Diagnosis not present

## 2020-03-07 NOTE — Therapy (Signed)
[place], [place], [address] Phone: [phone]   Fax:  [phone]  Physical Therapy Treatment  Patient Details  Name: Kristin Webb MRN: [id number] Date of Birth: [birth year] Referring Provider (PT): Dr Alvy Bimler   Encounter Date: 03/07/2020   PT End of Session - 03/07/20 0832    Visit Number 11    Number of Visits 12    Date for PT Re-Evaluation 03/13/20    Authorization Type Approved 12 visits from 6/23-8/4    Authorization - Visit Number 11    Authorization - Number of Visits 12    PT Start Time 0801    PT Stop Time 2979    PT Time Calculation (min) 53 min    Activity Tolerance Patient tolerated treatment well;Treatment limited secondary to medical complications (Comment)    Behavior During Therapy [facility] for tasks assessed/performed           Past Medical History:  Diagnosis Date  . Burning mouth syndrome   . Chronic fatigue   . Constipation   . Diabetes ([place]) 05/09/2018  . Diarrhea    in the past after gallbladder removal  . Fibromyalgia   . GERD (gastroesophageal reflux disease)   . History of kidney stones 07/2015  . Hyperlipidemia   . Hypertension    borderline not on meds   . Insomnia secondary to depression with anxiety   . Lumbar disc disease   . Ovarian cancer ([place]) 2014/2020   met nodule in abd  . Pelvic mass in female   . Pneumonia    hx of pneumonia as an infant  . PONV (postoperative nausea and vomiting)    pain from gas hernia 2017  . Shortness of breath    with exertion   . Sleep apnea    uses CPAP  . Yeast infection     Past Surgical History:  Procedure Laterality Date  . ABDOMINAL HYSTERECTOMY     early 19s  . APPENDECTOMY     WITH DEBULKING/BSO  . CHOLECYSTECTOMY     early 76s  . CYSTOSCOPY W/ URETERAL STENT PLACEMENT Left 07/09/2015   DUE TO NEPHROLITHIASIS Procedure: CYSTOSCOPY WITH LEFT RETROGRADE PYELOGRAM/ LEFT URETERAL STENT PLACEMENT;   Surgeon: Ardis Hughs, MD;  [facility];  Service: Urology;  Laterality: Left;  . HERNIA REPAIR    . INCISIONAL HERNIA REPAIR N/A 12/25/2015   WITH MESH Procedure:  INCISIONAL HERNIA REPAIR;  Surgeon: Ralene Ok, MD;  [facility];  Service: General;  Laterality: N/A;  . INCISIONAL HERNIA REPAIR N/A 10/21/2018   Procedure: LAPAROSCOPIC INCISIONAL HERNIA REPAIR WITH MESH;  Surgeon: Ralene Ok, MD;  Location: [place];  Service: General;  Laterality: N/A;  . INSERTION OF MESH N/A 12/25/2015   Procedure: INSERTION OF MESH;  Surgeon: Ralene Ok, MD;  [facility];  Service: General;  Laterality: N/A;  . LAPAROTOMY Bilateral 11/08/2012   Procedure: EXPLORATORY LAPAROTOMY BILATERAL SALPINGO OOPHORECTOMY TUMOR DEBULKING ;  Surgeon: Imagene Gurney A. Alycia Rossetti, MD;  [facility];  Service: Gynecology;  Laterality: Bilateral;  APPENDECTOMY / OMENTECTOMY  . LAPAROTOMY N/A 12/25/2015   Procedure: EXPLORATORY LAPAROTOMY;  Surgeon: Ralene Ok, MD;  [facility];  Service: General;  Laterality: N/A;  . LYSIS OF ADHESION N/A 12/25/2015   Procedure: LYSIS OF ADHESION;  Surgeon: Ralene Ok, MD;  [facility];  Service: General;  Laterality: N/A;  . PARTIAL HYSTERECTOMY  2006  . TRIGGER FINGER RELEASE      There  were no vitals filed for this visit.   Subjective Assessment - 03/07/20 0804    Subjective My PCP made some changes to my BP so I am now on 3 BP meds.  It should be another 3 weeks before BP meds should kick in.    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  Recurrence found in 2019 with new nodules in the left lower quadrant and colon.  Just completed 6 sessions of chemotherapy and now on maintenance infusions every 3 weeks.  Fibromyalgia, Lumbar DDD, DM    Currently in Pain? Yes    Pain Score 3     Pain Location Back    Pain Orientation Lower    Pain Descriptors / Indicators  Aching;Dull    Pain Type Chronic pain    Pain Onset More than a month ago    Pain Frequency Intermittent              [facility] PT Assessment - 03/07/20 0001      High Level Balance   High Level Balance Comments [place] ABS: 34/40                         [facility] Adult PT Treatment/Exercise - 03/07/20 0001      Neuro Re-ed    Neuro Re-ed Details  Re-tested all [place] balance scale       Knee/Hip Exercises: Aerobic   Nustep (arms at setting 8) Level 3, x8 mins with therapist present to monitor pt throughout                        PT Long Term Goals - 03/07/20 0823      PT LONG TERM GOAL #1   Title Pt will improve [place] ABS to 30/40    Baseline 34    Status Achieved      PT LONG TERM GOAL #2   Title Pt will demonstrate single leg stance time of 10 seconds on the left LE    Baseline 4 seconds; >12 sec with small LOB but able to self correct without putting foot down-03/04/20, on7/29/21 23 seconds    Status Achieved      PT LONG TERM GOAL #3   Title Pt will report no worsening of fatigue by the end of the day    Baseline No change with this as of yet, but pt has also been dealing with changing dosage of BP which may affect this-03/04/20    Status On-going      PT LONG TERM GOAL #4   Title Pt will be ind with final HEP    Baseline Pt is independent with current HEP at this time - 03/04/20    Status Partially Met                 Plan - 03/07/20 0834    Clinical Impression Statement Re-tested [place] advanced balance scale which pt has significantly improved from 26/40 to 34/40 with the most difficulty still involving blocked visual input and/or head turns.  Pt overall feels less "almost falling episodes" but still has trouble with fatigue inthe afternoon and fluctuating BP.  BP did not fluctuate much today which is encouraging.  173/91 to 171/98 post treatment.  Pt has one more visit before new [facility] request needed.    PT Frequency 2x  / week    PT Duration 6  weeks    PT Treatment/Interventions ADLs/Self Care Home Management;Gait training;Therapeutic exercise;Patient/family education;Manual techniques;Neuromuscular re-education    PT Next Visit Plan Cont to monitor BP in clinic. Cont Nustep and balance work, walking along incline of black peds mat; also include foam and single leg with Lt acceptance activities and activities, like head turns, when walking.    Consulted and Agree with Plan of Care Patient           Patient will benefit from skilled therapeutic intervention in order to improve the following deficits and impairments:     Visit Diagnosis: Other abnormalities of gait and mobility  Aftercare following surgery for neoplasm     Problem List Patient Active Problem List   Diagnosis Date Noted  . Balance problems 01/29/2020  . Sinus congestion 01/29/2020  . Bilateral cataracts 12/12/2019  . UTI (urinary tract infection) 11/22/2019  . Dysuria 11/20/2019  . 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 Thrombocytopenia ([place]) 09/08/2019  . Fibromyalgia 09/08/2019  . Goals of care, counseling/discussion 08/08/2019  . Excessive body weight gain 05/09/2018  . Neoplastic malignant related fatigue 05/09/2018  . OSA on CPAP 05/09/2018  . Newly diagnosed diabetes ([place]) 05/09/2018  . Hypercalcemia 07/21/2016  . Other fatigue 07/15/2016  . Excessive daytime sleepiness 05/18/2016  . Abdominal wall hernia 05/18/2016  . Activity intolerance related to fatigue 05/18/2016  . S/P hernia repair 12/25/2015  . BRCA negative 09/25/2015  . Pyelonephritis 07/09/2015  . Nephrolithiasis 07/09/2015  . Insomnia secondary to depression with anxiety 12/24/2014  . Gastritis 12/20/2014  . Port catheter in place 12/20/2014  . Insomnia 12/12/2013  . Obesity (BMI 35.0-39.9 without comorbidity) 12/12/2013  . OSA (obstructive sleep apnea) 11/24/2013  . Burning mouth syndrome 11/24/2013  . Ventral hernia  04/18/2013  . Essential hypertension 04/18/2013  . 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

## 2020-03-13 ENCOUNTER — Encounter: Payer: Self-pay | Admitting: Hematology and Oncology

## 2020-03-13 ENCOUNTER — Inpatient Hospital Stay: Payer: Medicare HMO | Attending: Gynecologic Oncology | Admitting: Hematology and Oncology

## 2020-03-13 ENCOUNTER — Inpatient Hospital Stay: Payer: Medicare HMO

## 2020-03-13 ENCOUNTER — Telehealth: Payer: Self-pay | Admitting: Hematology and Oncology

## 2020-03-13 ENCOUNTER — Other Ambulatory Visit: Payer: Self-pay

## 2020-03-13 DIAGNOSIS — Z79899 Other long term (current) drug therapy: Secondary | ICD-10-CM | POA: Diagnosis not present

## 2020-03-13 DIAGNOSIS — R519 Headache, unspecified: Secondary | ICD-10-CM

## 2020-03-13 DIAGNOSIS — Z5112 Encounter for antineoplastic immunotherapy: Secondary | ICD-10-CM | POA: Diagnosis not present

## 2020-03-13 DIAGNOSIS — I10 Essential (primary) hypertension: Secondary | ICD-10-CM | POA: Diagnosis not present

## 2020-03-13 DIAGNOSIS — Z7984 Long term (current) use of oral hypoglycemic drugs: Secondary | ICD-10-CM | POA: Diagnosis not present

## 2020-03-13 DIAGNOSIS — Z7189 Other specified counseling: Secondary | ICD-10-CM

## 2020-03-13 DIAGNOSIS — C5701 Malignant neoplasm of right fallopian tube: Secondary | ICD-10-CM

## 2020-03-13 DIAGNOSIS — C5700 Malignant neoplasm of unspecified fallopian tube: Secondary | ICD-10-CM | POA: Diagnosis not present

## 2020-03-13 LAB — CMP (CANCER CENTER ONLY)
ALT: 30 U/L (ref 0–44)
AST: 25 U/L (ref 15–41)
Albumin: 3.9 g/dL (ref 3.5–5.0)
Alkaline Phosphatase: 81 U/L (ref 38–126)
Anion gap: 11 (ref 5–15)
BUN: 19 mg/dL (ref 8–23)
CO2: 24 mmol/L (ref 22–32)
Calcium: 9.9 mg/dL (ref 8.9–10.3)
Chloride: 103 mmol/L (ref 98–111)
Creatinine: 0.79 mg/dL (ref 0.44–1.00)
GFR, Est AFR Am: >60 mL/min (ref >60)
GFR, Estimated: >60 mL/min (ref >60)
Glucose, Bld: 166 mg/dL — ABNORMAL HIGH (ref 70–99)
Potassium: 3.6 mmol/L (ref 3.5–5.1)
Sodium: 138 mmol/L (ref 135–145)
Total Bilirubin: 0.5 mg/dL (ref 0.3–1.2)
Total Protein: 6.9 g/dL (ref 6.5–8.1)

## 2020-03-13 LAB — CBC WITH DIFFERENTIAL (CANCER CENTER ONLY)
Abs Immature Granulocytes: 0.01 10*3/uL (ref 0.00–0.07)
Basophils Absolute: 0 10*3/uL (ref 0.0–0.1)
Basophils Relative: 0 %
Eosinophils Absolute: 0.1 10*3/uL (ref 0.0–0.5)
Eosinophils Relative: 2 %
HCT: 39.7 % (ref 36.0–46.0)
Hemoglobin: 13.2 g/dL (ref 12.0–15.0)
Immature Granulocytes: 0 %
Lymphocytes Relative: 32 %
Lymphs Abs: 1.5 10*3/uL (ref 0.7–4.0)
MCH: 31.2 pg (ref 26.0–34.0)
MCHC: 33.2 g/dL (ref 30.0–36.0)
MCV: 93.9 fL (ref 80.0–100.0)
Monocytes Absolute: 0.4 10*3/uL (ref 0.1–1.0)
Monocytes Relative: 8 %
Neutro Abs: 2.7 10*3/uL (ref 1.7–7.7)
Neutrophils Relative %: 58 %
Platelet Count: 126 10*3/uL — ABNORMAL LOW (ref 150–400)
RBC: 4.23 MIL/uL (ref 3.87–5.11)
RDW: 12.7 % (ref 11.5–15.5)
WBC Count: 4.8 10*3/uL (ref 4.0–10.5)
nRBC: 0 % (ref 0.0–0.2)

## 2020-03-13 LAB — TOTAL PROTEIN, URINE DIPSTICK: Protein, ur: NEGATIVE mg/dL

## 2020-03-13 MED ORDER — LISINOPRIL 20 MG PO TABS: 20.0000 mg | ORAL_TABLET | Freq: Every day | ORAL | 11 refills | Status: DC

## 2020-03-13 MED ORDER — SODIUM CHLORIDE 0.9% FLUSH
10.0000 mL | Freq: Once | INTRAVENOUS | Status: AC
  Administered 2020-03-13: 10 mL
  Filled 2020-03-13: qty 10

## 2020-03-13 NOTE — Assessment & Plan Note (Signed)
Unfortunately, due to uncontrolled hypertension, we will cancel her treatment today and delay for 2 weeks I explained to the patient the importance of getting her blood pressure under control before we proceed further She is in agreement

## 2020-03-13 NOTE — Assessment & Plan Note (Signed)
She has uncontrolled hypertension based on blood pressure reading from home I have plan to add lisinopril In summary, she will take losartan/hydrochlorothiazide in the morning, amlodipine with lisinopril in the evening, as well as metoprolol twice a day I will reassess blood pressure control in her next visit

## 2020-03-13 NOTE — Telephone Encounter (Signed)
Scheduled appts per 8/4 sch msg. Pt declined print out of AVS and stated she would refer to mychart.

## 2020-03-13 NOTE — Progress Notes (Signed)
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[facility]) Unfortunately, due to uncontrolled hypertension, we will cancel her treatment today and delay for 2 weeks I explained to the patient the importance of getting her blood pressure under control before we proceed further She is in agreement  Essential hypertension She has uncontrolled hypertension based on blood pressure reading from home I have plan to add lisinopril In summary, she will take losartan/hydrochlorothiazide in the morning, amlodipine with lisinopril in the evening, as well as metoprolol twice a day I will reassess blood pressure control in her next visit  Frequent headaches She has more frequent headaches recently, on a daily basis I recommend discontinuation of ibuprofen or naproxen that could exacerbate hypertension I recommend getting her blood pressure under control before we proceed with bevacizumab and she is in agreement She can take acetaminophen as needed   No orders of the defined types were placed in this encounter.   All questions were answered. The patient knows to call the clinic with any problems, questions or concerns. The total time spent in the appointment was 20 minutes encounter with patients including review of chart and various tests results, discussions about plan of care and coordination of care plan   [name], MD 03/13/2020 8:36 AM  INTERVAL HISTORY: Please see below for problem oriented charting. She returns for further follow-up According to blood pressure reading from home, her blood pressure has been running high, consistently over 150 She is also getting frequent headaches Denies blurriness of vision Documented heart rate has been low in the upper 50s  SUMMARY OF ONCOLOGIC HISTORY: Oncology History  Overview Note  Oncologic Summary: 1. 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 Random CA 125 elevation January 2019  Question mesenteric nodules and anterior abdominal wall nodule 3. GeneDx Breast/Ovary Panel negative (including BRCA, MMR's, RAD51 etc) ? [facility] BRACAnalysis  Negative for BRCA1/2 in tumor   Fallopian tube carcinoma ([place])  11/01/2012 Imaging   Ct abdomen 1.  Interval development of large mid abdominal mass highly concerning for right ovarian cancer.  There is mild omental nodularity on the left, and peritoneal disease cannot be completely excluded.  There is no ascites or other evidence of metastatic disease. 2.  Mild associated renal pelvocaliectasis bilaterally without obstruction.  Nonobstructing left renal calculus and a small right renal angiomyolipoma noted incidentally.   11/08/2012 Pathology Results   1. 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 - BENIGN FALLOPIAN TUBE WITH NONSPECIFIC CHRONIC INFLAMMATION. 2. Ovary and fallopian tube, left - BENIGN OVARY; NEGATIVE FOR ATYPIA OR MALIGNANCY. - BENIGN FALLOPIAN TUBE; NEGATIVE FOR ATYPIA OR MALIGNANCY. 3. Omentum, resection for tumor - HIGH GRADE CARCINOMA, SEE COMMENT. 4. Appendix, Other than Incidental - FIBROUS OBLITERATION OF APPENDICEAL TIP. - NEGATIVE FOR MALIGNANCY.   11/08/2012 Surgery   Surgery: Exploratory laparotomy, bilateral salpingo-oophorectomy, appendectomy, infacolic omentectomy, optimal debulking  Surgeons:  Paola A. Alycia Rossetti, MD; Lahoma Crocker, MD   Assistant: Caswell Corwin  Pathology: Bilateral  fallopian tubes and ovaries to pathology. Appendix as  well as omentum. 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  Operative findings: 25 cm right adnexal mass with smooth surface. Surgically absent uterus. Atrophic-appearing left ovary. Normal appearing appendix. Within the omentum there were centimeter nodules scattered throughout the omentum. The remainder of the surfaces were benign.   12/08/2012 Procedure   Impression:  Placement of a subcutaneous port device.  The catheter tip is in the lower SVC and ready to be used.    12/13/2012 - 03/28/2013 Chemotherapy   s/p 6 cycles of paclitaxel and carboplatin   12/13/2012 - 03/28/2013 Chemotherapy   The patient had 6 cycles of carboplatin and Taxol   03/24/2013 Imaging   US abdomen   04/24/2013 Imaging   CT abdomen Interval resection of the large right pelvic and lower abdominal mass lesion with apparent omentectomy.  No evidence for intraperitoneal free fluid on today's study.  No discernible peritoneal lesions.  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   09/07/2013 Genetic Testing   Patient has genetic testing done for BRCA1/2 panel Results revealed patient has no mutation(s):   07/09/2015 Imaging   CT abdomen 1. 12 mm obstructive calculus at the left ureteropelvic junction with moderate proximal hydronephrosis. 2. 2 small supraumbilical ventral hernias, one containing a short segment of the mid transverse colon and the other containing a short segment of the mid small bowel. There is no associated evidence to suggest bowel incarceration or obstruction at this time. 3. Tiny locule of gas non dependently in the lumen of the urinary bladder. This is presumably iatrogenic related to recent catheterization for urinalysis. Alternatively, this could be seen in the setting of urinary tract infection with gas-forming organisms. Clinical correlation for history of  recent catheterization is recommended. 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 5. Status post cholecystectomy. 6. Additional incidental findings, as above.    12/11/2016 Imaging   Ct abdomen 1. No evidence of metastatic ovarian cancer. 2. Recurrent subxiphoid ventral abdominal wall hernia containing transverse colon. No evidence of incarceration or obstruction. 3. Stable incidental findings in the liver and kidneys. No recurrent urinary tract calculus. 4. Progressive lower lumbar spondylosis. 5.  Aortic Atherosclerosis (ICD10-I70.0).    08/30/2017 Imaging   MRI thoracic spine 1. At T5-6 there is a small central disc protrusion contacting the ventral thoracic spinal cord. No central canal or foraminal stenosis. 2. At T9-10 there is a small right paracentral disc protrusion. 3.  No acute osseous injury of the thoracic spine. 4. No aggressive osseous lesion to suggest metastatic disease.   09/24/2017 Tumor Marker   Patient's tumor was tested for the following markers: CA-125 Results of the tumor marker test revealed 21.7   09/30/2017 Imaging   CT abdomen 1. New small clustered soft tissue nodules in the left lower quadrant in the sigmoid mesentery, largest 1.0 cm, which could represent recurrent peritoneal tumor implants. No ascites.  2. Midline high ventral abdominal wall hernia containing a portion of the transverse colon is mildly increased in size, and without bowel complication at this time. 3. Chronic findings include: Aortic Atherosclerosis (ICD10-I70.0). Diffuse hepatic steatosis. 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   10/11/2017 PET scan   1. Nodules in the sigmoid mesentery are hypermetabolic and highly worrisome for metastatic disease. 2. Attic steatosis.    11/03/2017 Procedure   Successful CT-guided rectus abdominal muscle mass core biopsy. Path: - FOREIGN  BODY GIANT CELL REACTION INVOLVING  FIBROADIPOSE TISSUE AND SKELETAL MUSCLE. - NO EVIDENCE OF MALIGNANCY.   11/04/2017 Cancer Staging   Staging form: Fallopian Tube, AJCC 7th Edition - Clinical: FIGO Stage IIIC, calculated as Stage IV (rT3, N0, M1) - Signed by [name], MD on 08/09/2019   02/2018 Imaging   CT: 1. Continued increase in size small peritoneal nodules along the mesenteric border of the proximal sigmoid colon as well as along the serosal surface of the proximal sigmoid colon. 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 2. No evidence of distant disease. 3. Stable large ventral hernia.   05/2018 Imaging   PET: 1. Redemonstration of hypermetabolic nodules within the sigmoid mesocolon. Mild response to therapy relative to CT of 02/24/2018. Mixed response to therapy compared to the most recent PET of 10/11/2017. 2. Hypermetabolism within the right pelvic rectus musculature, increased since the prior PET.  Clinical service requested comparison to the 11/24/2017 CT. Index 10 mm nodule within the sigmoid mesocolon was similar to the 02/24/2018 CT, and as described on that exam, increased from 7 mm on 11/24/2017. More inferior nodule within the mesocolon measures 12 mm today on image 156/4 and 8 mm on 11/24/2017.   05/2018 Imaging   CT: IMPRESSION: 1. Since 02/24/2018, decreased size of peritoneal nodules centered in the sigmoid mesocolon. 2. No evidence of new or progressive disease. 3. Hepatic steatosis. 4. Subcentimeter right renal angiomyolipoma, similar. 5.  Aortic Atherosclerosis (ICD10-I70.0). 6. Ventral abdominal wall laxity containing transverse colon, similar.   08/2018 Imaging   CT: IMPRESSION: 1. Nodules within the sigmoid mesocolon have decreased in size compared to prior. No new peritoneal or omental nodularity. 2. No evidence local recurrence the pelvis. 3. Ventral hernia contains a segment of transverse colon. No change from prior.     06/19/2019 Imaging   1. No  evidence of metastatic disease in the abdomen pelvis. 2. No peritoneal or omental metastasis identified.  No free fluid. 3. Postcholecystectomy and hysterectomy. Comparison exams are made available. Comparison CT 09/08/2018 and 05/27/2018. PET-CT 05/27/2018    There is a nodule within the proximal aspect of the sigmoid colon measuring 2.2 by 2.2 cm. In comparison to prior CTs and PET-CT there was a hypermetabolic nodule at this location on the PET-CT of 08/27/2017 and on the CT of 09/08/2018 there was a small residual nodule. At that time (09/08/2018) the nodule measured 1.4 by 1.3 cm. Therefore this nodule has increased in the interval and concerning for recurrence of a serosal implant. The previous described lymph nodes in the sigmoid mesocolon and more central mesentery are not increased in size and not pathologic by size criteria.    Concern for recurrence of serosal metastasis in the proximal sigmoid colon with a 2 cm enlarging lesion. Lesion extends into the lumen. No evidence of high-grade obstruction. Consider FDG PET scan and/or potential colonoscopy for evaluation.   06/29/2019 PET scan   1. Enlarging serosal implant within the proximal sigmoid colon has intense metabolic activity most consistent with malignancy. Lesion exhibits a somewhat indolent progression as present on PET-CT scan from 10/11/2017 and 05/27/2018. 2. Focal hypermetabolic activity within the RIGHT rectus muscle just off midline is slightly decreased from comparison exam. This may be benign inflammation related to prior laparotomy, however malignancy not excluded.   07/26/2019 Procedure   She had colonoscopy which showed multiple polyps.  6 polyps were removed from the cecum, measuring 3 to 6 mm in size.  One 12 mm polyp was  removed from ascending colon and another 1 measures 7 mm.  One 5 mm polyp was removed from the transverse colon.  There is tumor noted in the sigmoid colon, approximately 35 cm from the anus which was  biopsied.  The tumor appeared to be fungating, infiltrative and ulcerated but nonobstructive.  It encompassed approximately one third of the circumference of the lumen.   07/26/2019 Pathology Results   Multiple polyps came back tubular adenomas.  2 ascending polyps came back sessile serrated adenoma months.  Sigmoid colon biopsy confirmed metastatic high-grade serous carcinoma.  The morphology and immunophenotype are consistent with metastatic high-grade serous carcinoma from tubal/ovarian primary site.   08/17/2019 - 12/12/2019 Chemotherapy   The patient had carboplatin and taxol   10/23/2019 Imaging   1. Sigmoid lesion with diminished size/conspicuity difficult to measure on the previous exam. Adjacent lymph nodes and nodularity less than a cm, largest on coronal image 48 measuring 5 mm. 2. Right rectus muscle with some thickening with mildly convex margin seen posteriorly on image 72 of series 2. This could be due to postsurgical change, however, metastatic disease is not excluded. Attention on follow-up is suggested. 3. No evidence for new metastatic disease. 4. Small hiatal hernia. 5. 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   Aortic Atherosclerosis (ICD10-I70.0).   01/01/2020 Imaging   1. No signs of new disease. 2. Tiny lymph nodes in the area of concern in the LEFT lower quadrant z. 3. Added density and subtle contour irregularity involving the rectus muscles best seen on sagittal images, associated with site of prior surgical incision just to the RIGHT of midline (image 72, series 2 and image 58, series 5. Not significantly changed compared to prior studies. This measures approximately 2.1 x 1 cm in the sagittal plane and is less well-defined in the axial plane. Potentially postoperative change. Attention on follow-up. 4. Aortic atherosclerosis.   Aortic Atherosclerosis (ICD10-I70.0).       01/29/2020 -  Chemotherapy   The patient had bevacizumab-bvzr (ZIRABEV) 1,300 mg in sodium  chloride 0.9 % 100 mL chemo infusion, 15 mg/kg = 1,300 mg, Intravenous,  Once, 2 of 4 cycles Administration: 1,300 mg (01/29/2020), 1,300 mg (02/19/2020)  for chemotherapy treatment.      REVIEW OF SYSTEMS:   Constitutional: Denies fevers, chills or abnormal weight loss Eyes: Denies blurriness of vision Ears, nose, mouth, throat, and face: Denies mucositis or sore throat Respiratory: Denies cough, dyspnea or wheezes Cardiovascular: Denies palpitation, chest discomfort or lower extremity swelling Gastrointestinal:  Denies nausea, heartburn or change in bowel habits Skin: Denies abnormal skin rashes Lymphatics: Denies new lymphadenopathy or easy bruising Neurological:Denies numbness, tingling or new weaknesses Behavioral/Psych: Mood is stable, no new changes  All other systems were reviewed with the patient and are negative.  I have reviewed the past medical history, past surgical history, social history and family history with the patient and they are unchanged from previous note.  ALLERGIES:  is allergic to [employer] tartrate], bee venom, and cortisone.  MEDICATIONS:  Current Outpatient Medications  Medication Sig Dispense Refill   amLODipine (NORVASC) 10 MG tablet Take 1 tablet (10 mg total) by mouth daily. 30 tablet 11   Armodafinil 250 MG tablet Take 1 tablet (250 mg total) by mouth daily. 90 tablet 1   Carboxymethylcellulose Sodium (EYE DROPS OP) Apply 2 drops to eye daily as needed (dry eye).     cholecalciferol (VITAMIN D3) 25 MCG (1000 UT) tablet Take 1,000 Units by mouth daily.  colestipol (COLESTID) 1 g tablet Take 0.5-1 g by mouth daily. Pt taking 1/2 to 1 tablet daily depending on meal choice; taken 1 hour after morning meds , and can not eat for another hour     EPINEPHrine (EPIPEN 2-PAK) 0.3 mg/0.3 mL DEVI Inject 0.3 mg into the muscle once as needed (For bee stings.). Reported on 01/20/2016     eszopiclone (LUNESTA) 2 MG TABS tablet TAKE ONE TABLET BY MOUTH  EVERYDAY AT BEDTIME 90 tablet 0   FLUoxetine (PROZAC) 10 MG capsule Take 10 mg by mouth every morning. Takes with a 40m capsule for her total dose of 310m     FLUoxetine (PROZAC) 20 MG capsule Take 20 mg by mouth every morning. Takes with a 1058mapsule for her total dose of 19m75m   lidocaine-prilocaine (EMLA) cream Apply 1 application topically as needed. 30 g 1   lisinopril (ZESTRIL) 20 MG tablet Take 1 tablet (20 mg total) by mouth daily. 30 tablet 11   losartan-hydrochlorothiazide (HYZAAR) 100-12.5 MG tablet Take 1 tablet by mouth daily.      MELATONIN PO Take 10 mg by mouth at bedtime.      metFORMIN (GLUCOPHAGE-XR) 500 MG 24 hr tablet Take 500 mg by mouth daily with breakfast.   3   metoprolol tartrate (LOPRESSOR) 25 MG tablet Take 1 tablet (25 mg total) by mouth 2 (two) times daily. 60 tablet 3   Multiple Vitamin (MULTIVITAMIN WITH MINERALS) TABS Take 1 tablet by mouth every morning.      ondansetron (ZOFRAN) 8 MG tablet Take 1 tablet (8 mg total) by mouth every 8 (eight) hours as needed for refractory nausea / vomiting. Start on day 3 after carboplatin chemo. 30 tablet 1   prochlorperazine (COMPAZINE) 10 MG tablet Take 1 tablet (10 mg total) by mouth every 6 (six) hours as needed (Nausea or vomiting). 30 tablet 1   rosuvastatin (CRESTOR) 20 MG tablet Take 20 mg by mouth every morning.      No current facility-administered medications for this visit.   Facility-Administered Medications Ordered in Other Visits  Medication Dose Route Frequency Provider Last Rate Last Admin   sodium chloride flush (NS) 0.9 % injection 10 mL  10 mL Intravenous PRN GehrNancy Marus   10 mL at 05/12/17 0823    PHYSICAL EXAMINATION: ECOG PERFORMANCE STATUS: 1 - Symptomatic but completely ambulatory  Vitals:   03/13/20 0816  BP: 138/86  Pulse: 71  Resp: 18  Temp: 98.3 F (36.8 C)  SpO2: 100%   Filed Weights   03/13/20 0816  Weight: 190 lb 9.6 oz (86.5 kg)    GENERAL:alert, no  distress and comfortable NEURO: alert & oriented x 3 with fluent speech, no focal motor/sensory deficits  LABORATORY DATA:  I have reviewed the data as listed    Component Value Date/Time   NA 138 03/13/2020 0800   NA 141 12/02/2016 1219   K 3.6 03/13/2020 0800   K 3.7 12/02/2016 1219   CL 103 03/13/2020 0800   CL 103 01/18/2013 1329   CO2 24 03/13/2020 0800   CO2 27 12/02/2016 1219   GLUCOSE 166 (H) 03/13/2020 0800   GLUCOSE 98 12/02/2016 1219   GLUCOSE 110 (H) 01/18/2013 1329   BUN 19 03/13/2020 0800   BUN 15.3 12/02/2016 1219   CREATININE 0.79 03/13/2020 0800   CREATININE 0.7 12/02/2016 1219   CALCIUM 9.9 03/13/2020 0800   CALCIUM 10.1 12/02/2016 1219   PROT 6.9 03/13/2020 0800  PROT 7.1 12/02/2016 1219   ALBUMIN 3.9 03/13/2020 0800   ALBUMIN 4.2 12/02/2016 1219   AST 25 03/13/2020 0800   AST 21 12/02/2016 1219   ALT 30 03/13/2020 0800   ALT 27 12/02/2016 1219   ALKPHOS 81 03/13/2020 0800   ALKPHOS 104 12/02/2016 1219   BILITOT 0.5 03/13/2020 0800   BILITOT 0.37 12/02/2016 1219   GFRNONAA >60 03/13/2020 0800   GFRAA >60 03/13/2020 0800    No results found for: SPEP, UPEP  Lab Results  Component Value Date   WBC 4.8 03/13/2020   NEUTROABS 2.7 03/13/2020   HGB 13.2 03/13/2020   HCT 39.7 03/13/2020   MCV 93.9 03/13/2020   PLT 126 (L) 03/13/2020      Chemistry      Component Value Date/Time   NA 138 03/13/2020 0800   NA 141 12/02/2016 1219   K 3.6 03/13/2020 0800   K 3.7 12/02/2016 1219   CL 103 03/13/2020 0800   CL 103 01/18/2013 1329   CO2 24 03/13/2020 0800   CO2 27 12/02/2016 1219   BUN 19 03/13/2020 0800   BUN 15.3 12/02/2016 1219   CREATININE 0.79 03/13/2020 0800   CREATININE 0.7 12/02/2016 1219   GLU 236 12/13/2012 1558      Component Value Date/Time   CALCIUM 9.9 03/13/2020 0800   CALCIUM 10.1 12/02/2016 1219   ALKPHOS 81 03/13/2020 0800   ALKPHOS 104 12/02/2016 1219   AST 25 03/13/2020 0800   AST 21 12/02/2016 1219   ALT 30  03/13/2020 0800   ALT 27 12/02/2016 1219   BILITOT 0.5 03/13/2020 0800   BILITOT 0.37 12/02/2016 1219

## 2020-03-13 NOTE — Assessment & Plan Note (Signed)
She has more frequent headaches recently, on a daily basis I recommend discontinuation of ibuprofen or naproxen that could exacerbate hypertension I recommend getting her blood pressure under control before we proceed with bevacizumab and she is in agreement She can take acetaminophen as needed

## 2020-03-14 ENCOUNTER — Ambulatory Visit: Payer: Medicare HMO | Attending: Hematology and Oncology

## 2020-03-14 VITALS — BP 145/91 | HR 72

## 2020-03-14 DIAGNOSIS — Z483 Aftercare following surgery for neoplasm: Secondary | ICD-10-CM | POA: Insufficient documentation

## 2020-03-14 DIAGNOSIS — R2689 Other abnormalities of gait and mobility: Secondary | ICD-10-CM | POA: Diagnosis not present

## 2020-03-14 NOTE — Therapy (Signed)
[place], [place], [address] Phone: [phone]   Fax:  [phone]  Physical Therapy Treatment  Patient Details  Name: Kristin Webb MRN: [id number] Date of Birth: 1946-06-13 Referring Provider (PT): Dr Alvy Bimler   Encounter Date: 03/14/2020   PT End of Session - 03/14/20 0825    Visit Number 12    Number of Visits 18    Date for PT Re-Evaluation 04/25/20    Authorization Type Approved 12 visits from 6/23-8/4; request sent from 03/14/20 to 05/04/20    Authorization - Visit Number 12    Authorization - Number of Visits 18    PT Start Time 0804    PT Stop Time 0900    PT Time Calculation (min) 56 min    Activity Tolerance Patient tolerated treatment well    Behavior During Therapy [facility] for tasks assessed/performed           Past Medical History:  Diagnosis Date  . Burning mouth syndrome   . Chronic fatigue   . Constipation   . Diabetes ([place]) 05/09/2018  . Diarrhea    in the past after gallbladder removal  . Fibromyalgia   . GERD (gastroesophageal reflux disease)   . History of kidney stones 07/2015  . Hyperlipidemia   . Hypertension    borderline not on meds   . Insomnia secondary to depression with anxiety   . Lumbar disc disease   . Ovarian cancer ([place]) 2014/2020   met nodule in abd  . Pelvic mass in female   . Pneumonia    hx of pneumonia as an infant  . PONV (postoperative nausea and vomiting)    pain from gas hernia 2017  . Shortness of breath    with exertion   . Sleep apnea    uses CPAP  . Yeast infection     Past Surgical History:  Procedure Laterality Date  . ABDOMINAL HYSTERECTOMY     early 15s  . APPENDECTOMY     WITH DEBULKING/BSO  . CHOLECYSTECTOMY     early 55s  . CYSTOSCOPY W/ URETERAL STENT PLACEMENT Left 07/09/2015   DUE TO NEPHROLITHIASIS Procedure: CYSTOSCOPY WITH LEFT RETROGRADE PYELOGRAM/ LEFT URETERAL STENT PLACEMENT;  Surgeon: Ardis Hughs, MD;   [facility];  Service: Urology;  Laterality: Left;  . HERNIA REPAIR    . INCISIONAL HERNIA REPAIR N/A 12/25/2015   WITH MESH Procedure:  INCISIONAL HERNIA REPAIR;  Surgeon: Ralene Ok, MD;  [facility];  Service: General;  Laterality: N/A;  . INCISIONAL HERNIA REPAIR N/A 10/21/2018   Procedure: LAPAROSCOPIC INCISIONAL HERNIA REPAIR WITH MESH;  Surgeon: Ralene Ok, MD;  Location: [place];  Service: General;  Laterality: N/A;  . INSERTION OF MESH N/A 12/25/2015   Procedure: INSERTION OF MESH;  Surgeon: Ralene Ok, MD;  [facility];  Service: General;  Laterality: N/A;  . LAPAROTOMY Bilateral 11/08/2012   Procedure: EXPLORATORY LAPAROTOMY BILATERAL SALPINGO OOPHORECTOMY TUMOR DEBULKING ;  Surgeon: Imagene Gurney A. Alycia Rossetti, MD;  [facility];  Service: Gynecology;  Laterality: Bilateral;  APPENDECTOMY / OMENTECTOMY  . LAPAROTOMY N/A 12/25/2015   Procedure: EXPLORATORY LAPAROTOMY;  Surgeon: Ralene Ok, MD;  [facility];  Service: General;  Laterality: N/A;  . LYSIS OF ADHESION N/A 12/25/2015   Procedure: LYSIS OF ADHESION;  Surgeon: Ralene Ok, MD;  [facility];  Service: General;  Laterality: N/A;  . PARTIAL HYSTERECTOMY  2006  . TRIGGER FINGER RELEASE      Vitals:  03/14/20 0810  BP: (!) 145/91  Pulse: 72     Subjective Assessment - 03/14/20 0810    Subjective I saw Dr. Alvy Bimler yesterday and she added one more BP medicine yesterday so now I'm on 4. One I can only take when I'm done driving for the day. I felt a litle dizzy walking in here this morning.    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  Recurrence found in 2019 with new nodules in the left lower quadrant and colon.  Just completed 6 sessions of chemotherapy and now on maintenance infusions every 3 weeks.  Fibromyalgia, Lumbar DDD, DM    Patient Stated Goals I'd like to stop bumping in to things.     Currently in Pain? No/denies   just a HA                            [facility] Adult PT Treatment/Exercise - 03/14/20 0001      Self-Care   Other Self-Care Comments  Spent a few mins at beginning and end of session monitoring BP      Neuro Re-ed    Neuro Re-ed Details  In // bars: Heel-toe front and retro walking , then front with eyes closed and fingertips on bar, bil [place], slow, controlled high knee marching with slow eccentric contraction, then seated rest break before standing ihp 3 ways      Knee/Hip Exercises: Aerobic   Nustep (arms at setting 8) Level 4, x8 mins with therapist present to monitor pt throughout       Knee/Hip Exercises: Standing   Heel Raises Both;10 reps    Hip Flexion Stengthening;Right;Left;15 reps   in // bars, on Blue oval, 2# on ankles   Hip Abduction Stengthening;Right;Left;Other (comment)   In // bars on blue oval, 2# on ankles   Abduction Limitations 12 reps; VCs throughout to decrease bil hip er    Hip Extension Stengthening;Right;Left;15 reps   In // bars on blue oval, 2# on each ankle   Extension Limitations Demo and tactile cuing for correct technique                       PT Long Term Goals - 03/14/20 0815      PT LONG TERM GOAL #1   Title Pt will improve [place] ABS to 30/40    Baseline 34    Status Achieved      PT LONG TERM GOAL #2   Title Pt will demonstrate single leg stance time of 10 seconds on the left LE    Baseline 4 seconds; >12 sec with small LOB but able to self correct without putting foot down-03/04/20, on7/29/21 23 seconds    Status Achieved      PT LONG TERM GOAL #3   Title Pt will report no worsening of fatigue by the end of the day    Baseline No change with this as of yet, but pt has also been dealing with changing dosage of BP which may affect this-03/04/20; no change with end of day fatigue - 03/14/20    Time 4    Period Weeks    Status On-going      PT LONG TERM GOAL #4   Title Pt will  be ind with final HEP    Baseline Pt is independent with current HEP at this time - 03/04/20  Time 4    Period Weeks    Status Partially Met      PT LONG TERM GOAL #5   Title Pt will be able to report increased standing tolerance due to decrease fatigue, of up to 30 minutes for meal prep and other ADLs.    Baseline 15-20 minutes - 03/14/20    Time 4    Period Weeks    Status New                 Plan - 03/14/20 2355    Clinical Impression Statement New [employer] request sent today for 1x/wk x6 more weeks as pt is currently undergoing chemotherapy. She will benefit from continued physical therapy at this time to prevent functional decline while undergoing her chemotherapy. Pt has shown good progress up to this point with her [facility] ABS score having improved and her SLS goal also being improved having met both those goals. She hasn't noted any improvement with her end of fatigue but this could be attributed to her high BP that has been fluctuating and her MD's have been adjusting her BP. This has been seeming to improve over past week with pts at home BP checks and in clinic. Today was 145/91 and at end of session was 152/88 and HR 74 BPM. Dizziness more noticeable today with exertion.    Personal Factors and Comorbidities Age;Fitness;Comorbidity 1    Comorbidities chemotherapy    Examination-Activity Limitations Locomotion Level    Examination-Participation Restrictions Yard Work;Community Activity    Stability/Clinical Decision Making Evolving/Moderate complexity    Rehab Potential Good    PT Frequency 1x / week    PT Duration 6 weeks    PT Treatment/Interventions ADLs/Self Care Home Management;Gait training;Therapeutic exercise;Patient/family education;Manual techniques;Neuromuscular re-education    PT Next Visit Plan Renewal done today for MD and [facility] request; Assess BP before and during session prn; continue with bigh level balance activities including head turns, and endurance  acitivities, like the NuStep, progressing HEP prn    PT Home Exercise Plan Straight leg raises in standing with theraband; begin daily walking routine    Consulted and Agree with Plan of Care Patient           Patient will benefit from skilled therapeutic intervention in order to improve the following deficits and impairments:  Decreased activity tolerance, Difficulty walking, Decreased balance, Postural dysfunction, Impaired flexibility, Decreased coordination, Decreased strength  Visit Diagnosis: Other abnormalities of gait and mobility  Aftercare following surgery for neoplasm     Problem List Patient Active Problem List   Diagnosis Date Noted  . Frequent headaches 03/13/2020  . Balance problems 01/29/2020  . Sinus congestion 01/29/2020  . Bilateral cataracts 12/12/2019  . UTI (urinary tract infection) 11/22/2019  . Dysuria 11/20/2019  . 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 Thrombocytopenia ([place]) 09/08/2019  . Fibromyalgia 09/08/2019  . Goals of care, counseling/discussion 08/08/2019  . Excessive body weight gain 05/09/2018  . Neoplastic malignant related fatigue 05/09/2018  . OSA on CPAP 05/09/2018  . Newly diagnosed diabetes ([place]) 05/09/2018  . Hypercalcemia 07/21/2016  . Other fatigue 07/15/2016  . Excessive daytime sleepiness 05/18/2016  . Abdominal wall hernia 05/18/2016  . Activity intolerance related to fatigue 05/18/2016  . S/P hernia repair 12/25/2015  . BRCA negative 09/25/2015  . Pyelonephritis 07/09/2015  . Nephrolithiasis 07/09/2015  . Insomnia secondary to depression with anxiety 12/24/2014  . Gastritis 12/20/2014  . Port catheter in place 12/20/2014  . Insomnia 12/12/2013  .  Obesity (BMI 35.0-39.9 without comorbidity) 12/12/2013  . OSA (obstructive sleep apnea) 11/24/2013  . Burning mouth syndrome 11/24/2013  . Ventral hernia 04/18/2013  . Essential hypertension 04/18/2013  . 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

## 2020-03-15 NOTE — Addendum Note (Signed)
Addended by: Shan Levans R on: 03/15/2020 12:08 PM   Modules accepted: Orders

## 2020-03-22 ENCOUNTER — Telehealth: Payer: Self-pay

## 2020-03-22 NOTE — Telephone Encounter (Signed)
She called to notify Dr. Alvy Bimler. Dr. Arnell Asal will be managing her blood pressure from now on. She is sorry that it took her so long to call back. She will bring bp readings with her to next visit.

## 2020-03-26 ENCOUNTER — Other Ambulatory Visit: Payer: Self-pay

## 2020-03-26 ENCOUNTER — Ambulatory Visit: Payer: Medicare HMO

## 2020-03-26 VITALS — BP 149/87 | HR 62

## 2020-03-26 DIAGNOSIS — R2689 Other abnormalities of gait and mobility: Secondary | ICD-10-CM | POA: Diagnosis not present

## 2020-03-26 DIAGNOSIS — Z483 Aftercare following surgery for neoplasm: Secondary | ICD-10-CM

## 2020-03-26 NOTE — Therapy (Signed)
[place], [place], [address] Phone: [phone]   Fax:  [phone]  Physical Therapy Treatment  Patient Details  Name: Kristin Webb MRN: [id number] Date of Birth: 1945/11/12 Referring Provider (PT): Dr Alvy Bimler   Encounter Date: 03/26/2020   PT End of Session - 03/26/20 0849    Visit Number 13    Number of Visits 18    Date for PT Re-Evaluation 04/25/20    Authorization Type Approved 12 visits from 6/23-8/4; request sent from 03/14/20 to 05/04/20    Authorization - Visit Number 13    Authorization - Number of Visits 18    PT Start Time 0805    PT Stop Time 0900    PT Time Calculation (min) 55 min    Activity Tolerance Patient tolerated treatment well    Behavior During Therapy [facility] for tasks assessed/performed           Past Medical History:  Diagnosis Date  . Burning mouth syndrome   . Chronic fatigue   . Constipation   . Diabetes ([place]) 05/09/2018  . Diarrhea    in the past after gallbladder removal  . Fibromyalgia   . GERD (gastroesophageal reflux disease)   . History of kidney stones 07/2015  . Hyperlipidemia   . Hypertension    borderline not on meds   . Insomnia secondary to depression with anxiety   . Lumbar disc disease   . Ovarian cancer ([place]) 2014/2020   met nodule in abd  . Pelvic mass in female   . Pneumonia    hx of pneumonia as an infant  . PONV (postoperative nausea and vomiting)    pain from gas hernia 2017  . Shortness of breath    with exertion   . Sleep apnea    uses CPAP  . Yeast infection     Past Surgical History:  Procedure Laterality Date  . ABDOMINAL HYSTERECTOMY     early 40s  . APPENDECTOMY     WITH DEBULKING/BSO  . CHOLECYSTECTOMY     early 13s  . CYSTOSCOPY W/ URETERAL STENT PLACEMENT Left 07/09/2015   DUE TO NEPHROLITHIASIS Procedure: CYSTOSCOPY WITH LEFT RETROGRADE PYELOGRAM/ LEFT URETERAL STENT PLACEMENT;  Surgeon: Ardis Hughs, MD;   [facility];  Service: Urology;  Laterality: Left;  . HERNIA REPAIR    . INCISIONAL HERNIA REPAIR N/A 12/25/2015   WITH MESH Procedure:  INCISIONAL HERNIA REPAIR;  Surgeon: Ralene Ok, MD;  [facility];  Service: General;  Laterality: N/A;  . INCISIONAL HERNIA REPAIR N/A 10/21/2018   Procedure: LAPAROSCOPIC INCISIONAL HERNIA REPAIR WITH MESH;  Surgeon: Ralene Ok, MD;  Location: [place];  Service: General;  Laterality: N/A;  . INSERTION OF MESH N/A 12/25/2015   Procedure: INSERTION OF MESH;  Surgeon: Ralene Ok, MD;  [facility];  Service: General;  Laterality: N/A;  . LAPAROTOMY Bilateral 11/08/2012   Procedure: EXPLORATORY LAPAROTOMY BILATERAL SALPINGO OOPHORECTOMY TUMOR DEBULKING ;  Surgeon: Imagene Gurney A. Alycia Rossetti, MD;  [facility];  Service: Gynecology;  Laterality: Bilateral;  APPENDECTOMY / OMENTECTOMY  . LAPAROTOMY N/A 12/25/2015   Procedure: EXPLORATORY LAPAROTOMY;  Surgeon: Ralene Ok, MD;  [facility];  Service: General;  Laterality: N/A;  . LYSIS OF ADHESION N/A 12/25/2015   Procedure: LYSIS OF ADHESION;  Surgeon: Ralene Ok, MD;  [facility];  Service: General;  Laterality: N/A;  . PARTIAL HYSTERECTOMY  2006  . TRIGGER FINGER RELEASE      Vitals:  03/26/20 0814  BP: (!) 149/87  Pulse: 62     Subjective Assessment - 03/26/20 0812    Subjective My BP has still not gotten under control so my PCP is going to take over that. This week she is going to change that and be taking over the care of my BP meds. For now my infusions have been cancelled and we are only doing Avastin until we can get my BP under control. My fatigue has been worse than usual which is odd since I haven't had an infusion recently. Maybe it's my BP constatnyl fluctuating that's tiring me.    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  Recurrence found in 2019 with new nodules in the  left lower quadrant and colon.  Just completed 6 sessions of chemotherapy and now on maintenance infusions every 3 weeks.  Fibromyalgia, Lumbar DDD, DM    Patient Stated Goals I'd like to stop bumping in to things.    Currently in Pain? No/denies                             [facility] Adult PT Treatment/Exercise - 03/26/20 0001      Neuro Re-ed    Neuro Re-ed Details  In // bars: Heel-toe front walking 4x with fingertip-no UE support but pt reports feeling slighly less off balance, then with eyes closed and fingertips along bars 2x, then retro heel-toe 2x eyes open, 1x eyes closed and fingertips along bars; slow, controlled high knee marching with 1# on ankles 2x and +2 HHA, front heel-toe walking with Lt and Rt head turns, then up and down looking 2x each with pt feeling very unbalanced; on blue oval for bil SLS x1 min on Lt, x40 sec on Rt and had to stop due to LE fatigue, then bil tandem stance x30 sec each with fingertip to no UE support on blue oval; 4 seated rest breaks during session today due to increased fatigue   BP after // bars towards end of session 148/86, HR 67 bpm     Knee/Hip Exercises: Aerobic   Nustep (arms at setting 8) Level 4, x8 mins with therapist present to monitor pt throughout       Knee/Hip Exercises: Seated   Long Arc Quad Strengthening;Right;Left;15 reps   1# each ankle                      PT Long Term Goals - 03/14/20 0815      PT LONG TERM GOAL #1   Title Pt will improve [place] ABS to 30/40    Baseline 34    Status Achieved      PT LONG TERM GOAL #2   Title Pt will demonstrate single leg stance time of 10 seconds on the left LE    Baseline 4 seconds; >12 sec with small LOB but able to self correct without putting foot down-03/04/20, on7/29/21 23 seconds    Status Achieved      PT LONG TERM GOAL #3   Title Pt will report no worsening of fatigue by the end of the day    Baseline No change with this as of yet, but pt has also  been dealing with changing dosage of BP which may affect this-03/04/20; no change with end of day fatigue - 03/14/20    Time 4    Period Weeks  Status On-going      PT LONG TERM GOAL #4   Title Pt will be ind with final HEP    Baseline Pt is independent with current HEP at this time - 03/04/20    Time 4    Period Weeks    Status Partially Met      PT LONG TERM GOAL #5   Title Pt will be able to report increased standing tolerance due to decrease fatigue, of up to 30 minutes for meal prep and other ADLs.    Baseline 15-20 minutes - 03/14/20    Time 4    Period Weeks    Status New                 Plan - 03/26/20 0849    Clinical Impression Statement Pt comes in reporting her overall fatigue has been worsening since here last and attributes this to still fluctuating BP. Her PCP is going to get her on a regimen for BP this week and per pt report, "take over care for this". She tolerated NuStep and dynamic balance activites well today but did require a few more seated rest breaks than usual due to fatigue. Her systolic BP was lower than it has been staying in the 140's throughout session today (has been up to 527'P), diastolic in 82'U.    Personal Factors and Comorbidities Age;Fitness;Comorbidity 1    Comorbidities chemotherapy    Examination-Activity Limitations Locomotion Level    Examination-Participation Restrictions Yard Work;Community Activity    Stability/Clinical Decision Making Evolving/Moderate complexity    Rehab Potential Good    PT Frequency 1x / week    PT Duration 6 weeks    PT Treatment/Interventions ADLs/Self Care Home Management;Gait training;Therapeutic exercise;Patient/family education;Manual techniques;Neuromuscular re-education    PT Next Visit Plan Assess BP before and during session prn; continue with high level balance activities including head turns, and endurance acitivities, like the NuStep, progressing HEP prn    PT Home Exercise Plan Straight leg raises in  standing with theraband; begin daily walking routine    Consulted and Agree with Plan of Care Patient           Patient will benefit from skilled therapeutic intervention in order to improve the following deficits and impairments:  Decreased activity tolerance, Difficulty walking, Decreased balance, Postural dysfunction, Impaired flexibility, Decreased coordination, Decreased strength  Visit Diagnosis: Other abnormalities of gait and mobility  Aftercare following surgery for neoplasm     Problem List Patient Active Problem List   Diagnosis Date Noted  . Frequent headaches 03/13/2020  . Balance problems 01/29/2020  . Sinus congestion 01/29/2020  . Bilateral cataracts 12/12/2019  . UTI (urinary tract infection) 11/22/2019  . Dysuria 11/20/2019  . 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 Thrombocytopenia ([place]) 09/08/2019  . Fibromyalgia 09/08/2019  . Goals of care, counseling/discussion 08/08/2019  . Excessive body weight gain 05/09/2018  . Neoplastic malignant related fatigue 05/09/2018  . OSA on CPAP 05/09/2018  . Newly diagnosed diabetes ([place]) 05/09/2018  . Hypercalcemia 07/21/2016  . Other fatigue 07/15/2016  . Excessive daytime sleepiness 05/18/2016  . Abdominal wall hernia 05/18/2016  . Activity intolerance related to fatigue 05/18/2016  . S/P hernia repair 12/25/2015  . BRCA negative 09/25/2015  . Pyelonephritis 07/09/2015  . Nephrolithiasis 07/09/2015  . Insomnia secondary to depression with anxiety 12/24/2014  . Gastritis 12/20/2014  . Port catheter in place 12/20/2014  . Insomnia 12/12/2013  . Obesity (BMI 35.0-39.9 without comorbidity) 12/12/2013  .  OSA (obstructive sleep apnea) 11/24/2013  . Burning mouth syndrome 11/24/2013  . Ventral hernia 04/18/2013  . Essential hypertension 04/18/2013  . 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

## 2020-03-27 ENCOUNTER — Telehealth: Payer: Self-pay | Admitting: Hematology and Oncology

## 2020-03-27 ENCOUNTER — Other Ambulatory Visit: Payer: Self-pay

## 2020-03-27 ENCOUNTER — Inpatient Hospital Stay (HOSPITAL_BASED_OUTPATIENT_CLINIC_OR_DEPARTMENT_OTHER): Payer: Medicare HMO | Admitting: Hematology and Oncology

## 2020-03-27 ENCOUNTER — Other Ambulatory Visit: Payer: Self-pay | Admitting: *Deleted

## 2020-03-27 ENCOUNTER — Inpatient Hospital Stay: Payer: Medicare HMO

## 2020-03-27 ENCOUNTER — Encounter: Payer: Self-pay | Admitting: Hematology and Oncology

## 2020-03-27 ENCOUNTER — Telehealth: Payer: Self-pay | Admitting: Family Medicine

## 2020-03-27 VITALS — BP 120/62

## 2020-03-27 DIAGNOSIS — C5701 Malignant neoplasm of right fallopian tube: Secondary | ICD-10-CM

## 2020-03-27 DIAGNOSIS — R2689 Other abnormalities of gait and mobility: Secondary | ICD-10-CM | POA: Diagnosis not present

## 2020-03-27 DIAGNOSIS — Z79899 Other long term (current) drug therapy: Secondary | ICD-10-CM | POA: Diagnosis not present

## 2020-03-27 DIAGNOSIS — C5700 Malignant neoplasm of unspecified fallopian tube: Secondary | ICD-10-CM | POA: Diagnosis not present

## 2020-03-27 DIAGNOSIS — R519 Headache, unspecified: Secondary | ICD-10-CM | POA: Diagnosis not present

## 2020-03-27 DIAGNOSIS — Z7189 Other specified counseling: Secondary | ICD-10-CM

## 2020-03-27 DIAGNOSIS — Z7984 Long term (current) use of oral hypoglycemic drugs: Secondary | ICD-10-CM | POA: Diagnosis not present

## 2020-03-27 DIAGNOSIS — I10 Essential (primary) hypertension: Secondary | ICD-10-CM | POA: Diagnosis not present

## 2020-03-27 DIAGNOSIS — D696 Thrombocytopenia, unspecified: Secondary | ICD-10-CM

## 2020-03-27 DIAGNOSIS — Z5112 Encounter for antineoplastic immunotherapy: Secondary | ICD-10-CM | POA: Diagnosis not present

## 2020-03-27 LAB — CBC WITH DIFFERENTIAL (CANCER CENTER ONLY)
Abs Immature Granulocytes: 0.01 10*3/uL (ref 0.00–0.07)
Basophils Absolute: 0 10*3/uL (ref 0.0–0.1)
Basophils Relative: 0 %
Eosinophils Absolute: 0.1 10*3/uL (ref 0.0–0.5)
Eosinophils Relative: 2 %
HCT: 40.9 % (ref 36.0–46.0)
Hemoglobin: 13.5 g/dL (ref 12.0–15.0)
Immature Granulocytes: 0 %
Lymphocytes Relative: 34 %
Lymphs Abs: 1.5 10*3/uL (ref 0.7–4.0)
MCH: 30.8 pg (ref 26.0–34.0)
MCHC: 33 g/dL (ref 30.0–36.0)
MCV: 93.4 fL (ref 80.0–100.0)
Monocytes Absolute: 0.5 10*3/uL (ref 0.1–1.0)
Monocytes Relative: 10 %
Neutro Abs: 2.4 10*3/uL (ref 1.7–7.7)
Neutrophils Relative %: 54 %
Platelet Count: 129 10*3/uL — ABNORMAL LOW (ref 150–400)
RBC: 4.38 MIL/uL (ref 3.87–5.11)
RDW: 12.8 % (ref 11.5–15.5)
WBC Count: 4.5 10*3/uL (ref 4.0–10.5)
nRBC: 0 % (ref 0.0–0.2)

## 2020-03-27 LAB — CMP (CANCER CENTER ONLY)
ALT: 24 U/L (ref 0–44)
AST: 19 U/L (ref 15–41)
Albumin: 4 g/dL (ref 3.5–5.0)
Alkaline Phosphatase: 91 U/L (ref 38–126)
Anion gap: 10 (ref 5–15)
BUN: 20 mg/dL (ref 8–23)
CO2: 26 mmol/L (ref 22–32)
Calcium: 10.2 mg/dL (ref 8.9–10.3)
Chloride: 105 mmol/L (ref 98–111)
Creatinine: 0.73 mg/dL (ref 0.44–1.00)
GFR, Est AFR Am: >60 mL/min (ref >60)
GFR, Estimated: >60 mL/min (ref >60)
Glucose, Bld: 110 mg/dL — ABNORMAL HIGH (ref 70–99)
Potassium: 3.9 mmol/L (ref 3.5–5.1)
Sodium: 141 mmol/L (ref 135–145)
Total Bilirubin: 0.5 mg/dL (ref 0.3–1.2)
Total Protein: 7.1 g/dL (ref 6.5–8.1)

## 2020-03-27 LAB — TOTAL PROTEIN, URINE DIPSTICK: Protein, ur: NEGATIVE mg/dL

## 2020-03-27 MED ORDER — SODIUM CHLORIDE 0.9 % IV SOLN
Freq: Once | INTRAVENOUS | Status: AC
  Filled 2020-03-27: qty 250

## 2020-03-27 MED ORDER — ARMODAFINIL 250 MG PO TABS: 250.0000 mg | ORAL_TABLET | Freq: Every day | ORAL | 1 refills | Status: DC

## 2020-03-27 MED ORDER — SODIUM CHLORIDE 0.9% FLUSH
10.0000 mL | Freq: Once | INTRAVENOUS | Status: AC
  Administered 2020-03-27: 10 mL
  Filled 2020-03-27: qty 10

## 2020-03-27 MED ORDER — SODIUM CHLORIDE 0.9 % IV SOLN
15.0000 mg/kg | Freq: Once | INTRAVENOUS | Status: AC
  Administered 2020-03-27: 1300 mg via INTRAVENOUS
  Filled 2020-03-27: qty 48

## 2020-03-27 MED ORDER — SODIUM CHLORIDE 0.9% FLUSH
10.0000 mL | INTRAVENOUS | Status: DC | PRN
  Administered 2020-03-27: 10 mL
  Filled 2020-03-27: qty 10

## 2020-03-27 MED ORDER — HEPARIN SOD (PORK) LOCK FLUSH 100 UNIT/ML IV SOLN
500.0000 [IU] | Freq: Once | INTRAVENOUS | Status: AC | PRN
  Administered 2020-03-27: 500 [IU]
  Filled 2020-03-27: qty 5

## 2020-03-27 NOTE — Assessment & Plan Note (Signed)
Her balance is likely affected by recent chemotherapy She will continue physical therapy and rehab

## 2020-03-27 NOTE — Assessment & Plan Note (Signed)
She feels better Her blood pressure is under controlled She has no evidence of proteinuria We will continue treatment as scheduled When I see her next month, I will order CT imaging at the end of September for objective assessment of response to therapy

## 2020-03-27 NOTE — Progress Notes (Signed)
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[facility]) She feels better Her blood pressure is under controlled She has no evidence of proteinuria We will continue treatment as scheduled When I see her next month, I will order CT imaging at the end of September for objective assessment of response to therapy  Essential hypertension Her blood pressure control is better Systolic blood pressure based on her home monitoring is highest around 169, diastolic in the mid 67E She felt better with less headaches Per previous discussion, her primary care doctor will manage her blood pressure medications from now on  Balance problems Her balance is likely affected by recent chemotherapy She will continue physical therapy and rehab  Thrombocytopenia ([place]) Her thrombocytopenia is stable Observe only for now   No orders of the defined types were placed in this encounter.   All questions were answered. The patient knows to call the clinic with any problems, questions or concerns. The total time spent in the appointment was 20 minutes encounter with patients including review of chart and various tests results, discussions about plan of care and coordination of care plan   [name], MD 03/27/2020 11:32 AM  INTERVAL HISTORY: Please see below for problem oriented charting. She returns for treatment and follow-up She is feeling better Blood pressure control at home has improved She has less headaches She continues to have balance difficulties and is doing physical therapy and rehab She has no other new symptoms or side effects The patient denies any recent signs or symptoms of bleeding such as spontaneous epistaxis, hematuria or hematochezia.   SUMMARY OF ONCOLOGIC HISTORY: Oncology History Overview Note   Oncologic Summary: 1. 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 Random CA 125 elevation January 2019  Question mesenteric nodules and anterior abdominal wall nodule 3. GeneDx Breast/Ovary Panel negative (including BRCA, MMR's, RAD51 etc) ? [facility] BRACAnalysis  Negative for BRCA1/2 in tumor   Fallopian tube carcinoma ([place])  11/01/2012 Imaging   Ct abdomen 1.  Interval development of large mid abdominal mass highly concerning for right ovarian cancer.  There is mild omental nodularity on the left, and peritoneal disease cannot be completely excluded.  There is no ascites or other evidence of metastatic disease. 2.  Mild associated renal pelvocaliectasis bilaterally without obstruction.  Nonobstructing left renal calculus and a small right renal angiomyolipoma noted incidentally.   11/08/2012 Pathology Results   1. 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 - BENIGN FALLOPIAN TUBE WITH NONSPECIFIC CHRONIC INFLAMMATION. 2. Ovary and fallopian tube, left - BENIGN OVARY; NEGATIVE FOR ATYPIA OR MALIGNANCY. - BENIGN FALLOPIAN TUBE; NEGATIVE FOR ATYPIA OR MALIGNANCY. 3. Omentum, resection for tumor - HIGH GRADE CARCINOMA, SEE COMMENT. 4. Appendix, Other than Incidental - FIBROUS OBLITERATION OF APPENDICEAL TIP. - NEGATIVE FOR MALIGNANCY.   11/08/2012 Surgery   Surgery: Exploratory laparotomy, bilateral salpingo-oophorectomy, appendectomy, infacolic omentectomy, optimal debulking  Surgeons:  Paola A. Alycia Rossetti, MD; Lahoma Crocker, MD   Assistant: Caswell Corwin  Pathology: Bilateral fallopian tubes  and ovaries to pathology.  Appendix as well as omentum. 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  Operative findings: 25 cm right adnexal mass with smooth surface. Surgically absent uterus. Atrophic-appearing left ovary. Normal appearing appendix. Within the omentum there were centimeter nodules scattered throughout the omentum. The remainder of the surfaces were benign.   12/08/2012 Procedure   Impression:  Placement of a subcutaneous port device.  The catheter tip is in the lower SVC and ready to be used.    12/13/2012 - 03/28/2013 Chemotherapy   s/p 6 cycles of paclitaxel and carboplatin   12/13/2012 - 03/28/2013 Chemotherapy   The patient had 6 cycles of carboplatin and Taxol   03/24/2013 Imaging   US abdomen   04/24/2013 Imaging   CT abdomen Interval resection of the large right pelvic and lower abdominal mass lesion with apparent omentectomy.  No evidence for intraperitoneal free fluid on today's study.  No discernible peritoneal lesions.  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   09/07/2013 Genetic Testing   Patient has genetic testing done for BRCA1/2 panel Results revealed patient has no mutation(s):   07/09/2015 Imaging   CT abdomen 1. 12 mm obstructive calculus at the left ureteropelvic junction with moderate proximal hydronephrosis. 2. 2 small supraumbilical ventral hernias, one containing a short segment of the mid transverse colon and the other containing a short segment of the mid small bowel. There is no associated evidence to suggest bowel incarceration or obstruction at this time. 3. Tiny locule of gas non dependently in the lumen of the urinary bladder. This is presumably iatrogenic related to recent catheterization for urinalysis. Alternatively, this could be seen in the setting of urinary tract infection with gas-forming organisms. Clinical correlation for history of recent  catheterization is recommended. 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 5. Status post cholecystectomy. 6. Additional incidental findings, as above.    12/11/2016 Imaging   Ct abdomen 1. No evidence of metastatic ovarian cancer. 2. Recurrent subxiphoid ventral abdominal wall hernia containing transverse colon. No evidence of incarceration or obstruction. 3. Stable incidental findings in the liver and kidneys. No recurrent urinary tract calculus. 4. Progressive lower lumbar spondylosis. 5.  Aortic Atherosclerosis (ICD10-I70.0).    08/30/2017 Imaging   MRI thoracic spine 1. At T5-6 there is a small central disc protrusion contacting the ventral thoracic spinal cord. No central canal or foraminal stenosis. 2. At T9-10 there is a small right paracentral disc protrusion. 3.  No acute osseous injury of the thoracic spine. 4. No aggressive osseous lesion to suggest metastatic disease.   09/24/2017 Tumor Marker   Patient's tumor was tested for the following markers: CA-125 Results of the tumor marker test revealed 21.7   09/30/2017 Imaging   CT abdomen 1. New small clustered soft tissue nodules in the left lower quadrant in the sigmoid mesentery, largest 1.0 cm, which could represent recurrent peritoneal tumor implants. No ascites.  2. Midline high ventral abdominal wall hernia containing a portion of the transverse colon is mildly increased in size, and without bowel complication at this time. 3. Chronic findings include: Aortic Atherosclerosis (ICD10-I70.0). Diffuse hepatic steatosis. 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   10/11/2017 PET scan   1. Nodules in the sigmoid mesentery are hypermetabolic and highly worrisome for metastatic disease. 2. Attic steatosis.    11/03/2017 Procedure   Successful CT-guided rectus abdominal muscle mass core biopsy. Path: -  FOREIGN BODY GIANT CELL REACTION INVOLVING  FIBROADIPOSE TISSUE AND SKELETAL MUSCLE. - NO EVIDENCE OF MALIGNANCY.   11/04/2017 Cancer Staging   Staging form: Fallopian Tube, AJCC 7th Edition - Clinical: FIGO Stage IIIC, calculated as Stage IV (rT3, N0, M1) - Signed by [name], MD on 08/09/2019   02/2018 Imaging   CT: 1. Continued increase in size small peritoneal nodules along the mesenteric border of the proximal sigmoid colon as well as along the serosal surface of the proximal sigmoid colon. 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 2. No evidence of distant disease. 3. Stable large ventral hernia.   05/2018 Imaging   PET: 1. Redemonstration of hypermetabolic nodules within the sigmoid mesocolon. Mild response to therapy relative to CT of 02/24/2018. Mixed response to therapy compared to the most recent PET of 10/11/2017. 2. Hypermetabolism within the right pelvic rectus musculature, increased since the prior PET.  Clinical service requested comparison to the 11/24/2017 CT. Index 10 mm nodule within the sigmoid mesocolon was similar to the 02/24/2018 CT, and as described on that exam, increased from 7 mm on 11/24/2017. More inferior nodule within the mesocolon measures 12 mm today on image 156/4 and 8 mm on 11/24/2017.   05/2018 Imaging   CT: IMPRESSION: 1. Since 02/24/2018, decreased size of peritoneal nodules centered in the sigmoid mesocolon. 2. No evidence of new or progressive disease. 3. Hepatic steatosis. 4. Subcentimeter right renal angiomyolipoma, similar. 5.  Aortic Atherosclerosis (ICD10-I70.0). 6. Ventral abdominal wall laxity containing transverse colon, similar.   08/2018 Imaging   CT: IMPRESSION: 1. Nodules within the sigmoid mesocolon have decreased in size compared to prior. No new peritoneal or omental nodularity. 2. No evidence local recurrence the pelvis. 3. Ventral hernia contains a segment of transverse colon. No change from prior.     06/19/2019 Imaging   1. No  evidence of metastatic disease in the abdomen pelvis. 2. No peritoneal or omental metastasis identified.  No free fluid. 3. Postcholecystectomy and hysterectomy. Comparison exams are made available. Comparison CT 09/08/2018 and 05/27/2018. PET-CT 05/27/2018    There is a nodule within the proximal aspect of the sigmoid colon measuring 2.2 by 2.2 cm. In comparison to prior CTs and PET-CT there was a hypermetabolic nodule at this location on the PET-CT of 08/27/2017 and on the CT of 09/08/2018 there was a small residual nodule. At that time (09/08/2018) the nodule measured 1.4 by 1.3 cm. Therefore this nodule has increased in the interval and concerning for recurrence of a serosal implant. The previous described lymph nodes in the sigmoid mesocolon and more central mesentery are not increased in size and not pathologic by size criteria.    Concern for recurrence of serosal metastasis in the proximal sigmoid colon with a 2 cm enlarging lesion. Lesion extends into the lumen. No evidence of high-grade obstruction. Consider FDG PET scan and/or potential colonoscopy for evaluation.   06/29/2019 PET scan   1. Enlarging serosal implant within the proximal sigmoid colon has intense metabolic activity most consistent with malignancy. Lesion exhibits a somewhat indolent progression as present on PET-CT scan from 10/11/2017 and 05/27/2018. 2. Focal hypermetabolic activity within the RIGHT rectus muscle just off midline is slightly decreased from comparison exam. This may be benign inflammation related to prior laparotomy, however malignancy not excluded.   07/26/2019 Procedure   She had colonoscopy which showed multiple polyps.  6 polyps were removed from the cecum, measuring 3 to 6 mm in size.  One 12 mm polyp  was removed from ascending colon and another 1 measures 7 mm.  One 5 mm polyp was removed from the transverse colon.  There is tumor noted in the sigmoid colon, approximately 35 cm from the anus which was  biopsied.  The tumor appeared to be fungating, infiltrative and ulcerated but nonobstructive.  It encompassed approximately one third of the circumference of the lumen.   07/26/2019 Pathology Results   Multiple polyps came back tubular adenomas.  2 ascending polyps came back sessile serrated adenoma months.  Sigmoid colon biopsy confirmed metastatic high-grade serous carcinoma.  The morphology and immunophenotype are consistent with metastatic high-grade serous carcinoma from tubal/ovarian primary site.   08/17/2019 - 12/12/2019 Chemotherapy   The patient had carboplatin and taxol   10/23/2019 Imaging   1. Sigmoid lesion with diminished size/conspicuity difficult to measure on the previous exam. Adjacent lymph nodes and nodularity less than a cm, largest on coronal image 48 measuring 5 mm. 2. Right rectus muscle with some thickening with mildly convex margin seen posteriorly on image 72 of series 2. This could be due to postsurgical change, however, metastatic disease is not excluded. Attention on follow-up is suggested. 3. No evidence for new metastatic disease. 4. Small hiatal hernia. 5. 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   Aortic Atherosclerosis (ICD10-I70.0).   01/01/2020 Imaging   1. No signs of new disease. 2. Tiny lymph nodes in the area of concern in the LEFT lower quadrant z. 3. Added density and subtle contour irregularity involving the rectus muscles best seen on sagittal images, associated with site of prior surgical incision just to the RIGHT of midline (image 72, series 2 and image 58, series 5. Not significantly changed compared to prior studies. This measures approximately 2.1 x 1 cm in the sagittal plane and is less well-defined in the axial plane. Potentially postoperative change. Attention on follow-up. 4. Aortic atherosclerosis.   Aortic Atherosclerosis (ICD10-I70.0).       01/29/2020 -  Chemotherapy   The patient had bevacizumab-bvzr (ZIRABEV) 1,300 mg in sodium  chloride 0.9 % 100 mL chemo infusion, 15 mg/kg = 1,300 mg, Intravenous,  Once, 3 of 5 cycles Administration: 1,300 mg (01/29/2020), 1,300 mg (03/27/2020), 1,300 mg (02/19/2020)  for chemotherapy treatment.      REVIEW OF SYSTEMS:   Constitutional: Denies fevers, chills or abnormal weight loss Eyes: Denies blurriness of vision Ears, nose, mouth, throat, and face: Denies mucositis or sore throat Respiratory: Denies cough, dyspnea or wheezes Cardiovascular: Denies palpitation, chest discomfort or lower extremity swelling Gastrointestinal:  Denies nausea, heartburn or change in bowel habits Skin: Denies abnormal skin rashes Lymphatics: Denies new lymphadenopathy or easy bruising Behavioral/Psych: Mood is stable, no new changes  All other systems were reviewed with the patient and are negative.  I have reviewed the past medical history, past surgical history, social history and family history with the patient and they are unchanged from previous note.  ALLERGIES:  is allergic to [employer] tartrate], bee venom, and cortisone.  MEDICATIONS:  Current Outpatient Medications  Medication Sig Dispense Refill  . amLODipine (NORVASC) 10 MG tablet Take 1 tablet (10 mg total) by mouth daily. 30 tablet 11  . Armodafinil 250 MG tablet Take 1 tablet (250 mg total) by mouth daily. 90 tablet 1  . Carboxymethylcellulose Sodium (EYE DROPS OP) Apply 2 drops to eye daily as needed (dry eye).    . cholecalciferol (VITAMIN D3) 25 MCG (1000 UT) tablet Take 1,000 Units by mouth daily.    Marland Kitchen  colestipol (COLESTID) 1 g tablet Take 0.5-1 g by mouth daily. Pt taking 1/2 to 1 tablet daily depending on meal choice; taken 1 hour after morning meds , and can not eat for another hour    . EPINEPHrine (EPIPEN 2-PAK) 0.3 mg/0.3 mL DEVI Inject 0.3 mg into the muscle once as needed (For bee stings.). Reported on 01/20/2016    . eszopiclone (LUNESTA) 2 MG TABS tablet TAKE ONE TABLET BY MOUTH EVERYDAY AT BEDTIME 90 tablet 0  .  FLUoxetine (PROZAC) 10 MG capsule Take 10 mg by mouth every morning. Takes with a 63m capsule for her total dose of 341m    . Marland KitchenLUoxetine (PROZAC) 20 MG capsule Take 20 mg by mouth every morning. Takes with a 1080mapsule for her total dose of 6m51m  . lidocaine-prilocaine (EMLA) cream Apply 1 application topically as needed. 30 g 1  . lisinopril (ZESTRIL) 20 MG tablet Take 1 tablet (20 mg total) by mouth daily. 30 tablet 11  . losartan-hydrochlorothiazide (HYZAAR) 100-12.5 MG tablet Take 1 tablet by mouth daily.     . MEMarland KitchenATONIN PO Take 10 mg by mouth at bedtime.     . metFORMIN (GLUCOPHAGE-XR) 500 MG 24 hr tablet Take 500 mg by mouth daily with breakfast.   3  . metoprolol tartrate (LOPRESSOR) 25 MG tablet Take 1 tablet (25 mg total) by mouth 2 (two) times daily. 60 tablet 3  . Multiple Vitamin (MULTIVITAMIN WITH MINERALS) TABS Take 1 tablet by mouth every morning.     . ondansetron (ZOFRAN) 8 MG tablet Take 1 tablet (8 mg total) by mouth every 8 (eight) hours as needed for refractory nausea / vomiting. Start on day 3 after carboplatin chemo. 30 tablet 1  . prochlorperazine (COMPAZINE) 10 MG tablet Take 1 tablet (10 mg total) by mouth every 6 (six) hours as needed (Nausea or vomiting). 30 tablet 1  . rosuvastatin (CRESTOR) 20 MG tablet Take 20 mg by mouth every morning.      No current facility-administered medications for this visit.   Facility-Administered Medications Ordered in Other Visits  Medication Dose Route Frequency Provider Last Rate Last Admin  . sodium chloride flush (NS) 0.9 % injection 10 mL  10 mL Intravenous PRN GehrNancy Marus   10 mL at 05/12/17 0823  . sodium chloride flush (NS) 0.9 % injection 10 mL  10 mL Intracatheter PRN GorsAlvy Bimler, MD   10 mL at 03/27/20 1050    PHYSICAL EXAMINATION: ECOG PERFORMANCE STATUS: 1 - Symptomatic but completely ambulatory  Vitals:   03/27/20 0844  BP: 131/73  Pulse: 65  Resp: 18  Temp: 98.6 F (37 C)  SpO2: 100%   Filed  Weights   03/27/20 0844  Weight: 191 lb (86.6 kg)    GENERAL:alert, no distress and comfortable SKIN: skin color, texture, turgor are normal, no rashes or significant lesions EYES: normal, Conjunctiva are pink and non-injected, sclera clear OROPHARYNX:no exudate, no erythema and lips, buccal mucosa, and tongue normal  NECK: supple, thyroid normal size, non-tender, without nodularity LYMPH:  no palpable lymphadenopathy in the cervical, axillary or inguinal LUNGS: clear to auscultation and percussion with normal breathing effort HEART: regular rate & rhythm and no murmurs and no lower extremity edema ABDOMEN:abdomen soft, non-tender and normal bowel sounds Musculoskeletal:no cyanosis of digits and no clubbing  NEURO: alert & oriented x 3 with fluent speech, no focal motor/sensory deficits  LABORATORY DATA:  I have reviewed the data as listed    Component  Value Date/Time   NA 141 03/27/2020 0744   NA 141 12/02/2016 1219   K 3.9 03/27/2020 0744   K 3.7 12/02/2016 1219   CL 105 03/27/2020 0744   CL 103 01/18/2013 1329   CO2 26 03/27/2020 0744   CO2 27 12/02/2016 1219   GLUCOSE 110 (H) 03/27/2020 0744   GLUCOSE 98 12/02/2016 1219   GLUCOSE 110 (H) 01/18/2013 1329   BUN 20 03/27/2020 0744   BUN 15.3 12/02/2016 1219   CREATININE 0.73 03/27/2020 0744   CREATININE 0.7 12/02/2016 1219   CALCIUM 10.2 03/27/2020 0744   CALCIUM 10.1 12/02/2016 1219   PROT 7.1 03/27/2020 0744   PROT 7.1 12/02/2016 1219   ALBUMIN 4.0 03/27/2020 0744   ALBUMIN 4.2 12/02/2016 1219   AST 19 03/27/2020 0744   AST 21 12/02/2016 1219   ALT 24 03/27/2020 0744   ALT 27 12/02/2016 1219   ALKPHOS 91 03/27/2020 0744   ALKPHOS 104 12/02/2016 1219   BILITOT 0.5 03/27/2020 0744   BILITOT 0.37 12/02/2016 1219   GFRNONAA >60 03/27/2020 0744   GFRAA >60 03/27/2020 0744    No results found for: SPEP, UPEP  Lab Results  Component Value Date   WBC 4.5 03/27/2020   NEUTROABS 2.4 03/27/2020   HGB 13.5  03/27/2020   HCT 40.9 03/27/2020   MCV 93.4 03/27/2020   PLT 129 (L) 03/27/2020      Chemistry      Component Value Date/Time   NA 141 03/27/2020 0744   NA 141 12/02/2016 1219   K 3.9 03/27/2020 0744   K 3.7 12/02/2016 1219   CL 105 03/27/2020 0744   CL 103 01/18/2013 1329   CO2 26 03/27/2020 0744   CO2 27 12/02/2016 1219   BUN 20 03/27/2020 0744   BUN 15.3 12/02/2016 1219   CREATININE 0.73 03/27/2020 0744   CREATININE 0.7 12/02/2016 1219   GLU 236 12/13/2012 1558      Component Value Date/Time   CALCIUM 10.2 03/27/2020 0744   CALCIUM 10.1 12/02/2016 1219   ALKPHOS 91 03/27/2020 0744   ALKPHOS 104 12/02/2016 1219   AST 19 03/27/2020 0744   AST 21 12/02/2016 1219   ALT 24 03/27/2020 0744   ALT 27 12/02/2016 1219   BILITOT 0.5 03/27/2020 0744   BILITOT 0.37 12/02/2016 1219

## 2020-03-27 NOTE — Assessment & Plan Note (Signed)
Her thrombocytopenia is stable Observe only for now 

## 2020-03-27 NOTE — Telephone Encounter (Signed)
Scheduled appts per 8/18 los. Gave pt a print out of AVS.

## 2020-03-27 NOTE — Telephone Encounter (Signed)
[place] from [facility] is needing a prescription called in for the pt's Armodafinil 250 MG tablet for a 30 day supply. She states this is needed as soon as possible.

## 2020-03-27 NOTE — Assessment & Plan Note (Signed)
Her blood pressure control is better Systolic blood pressure based on her home monitoring is highest around 484, diastolic in the mid 98S She felt better with less headaches Per previous discussion, her primary care doctor will manage her blood pressure medications from now on

## 2020-03-27 NOTE — Telephone Encounter (Signed)
[facility] called [facility] and they did receive escript and they are good.

## 2020-03-27 NOTE — Telephone Encounter (Signed)
Last seen 09-25-19 AL/NP, next appt 09-24-20.

## 2020-03-27 NOTE — Patient Instructions (Signed)
[facility] Discharge Instructions for Patients Receiving Chemotherapy  Today you received the following chemotherapy agents Bevacizumab  To help prevent nausea and vomiting after your treatment, we encourage you to take your nausea medication as directed   If you develop nausea and vomiting that is not controlled by your nausea medication, call the clinic.   BELOW ARE SYMPTOMS THAT SHOULD BE REPORTED IMMEDIATELY:  *FEVER GREATER THAN 100.5 F  *CHILLS WITH OR WITHOUT FEVER  NAUSEA AND VOMITING THAT IS NOT CONTROLLED WITH YOUR NAUSEA MEDICATION  *UNUSUAL SHORTNESS OF BREATH  *UNUSUAL BRUISING OR BLEEDING  TENDERNESS IN MOUTH AND THROAT WITH OR WITHOUT PRESENCE OF ULCERS  *URINARY PROBLEMS  *BOWEL PROBLEMS  UNUSUAL RASH Items with * indicate a potential emergency and should be followed up as soon as possible.  Feel free to call the clinic should you have any questions or concerns. The clinic phone number is [phone].  Please show the CHEMO ALERT CARD at check-in to the Emergency Department and triage nurse.   

## 2020-04-02 ENCOUNTER — Ambulatory Visit: Payer: Medicare HMO

## 2020-04-02 ENCOUNTER — Ambulatory Visit: Payer: Medicare HMO | Admitting: Hematology and Oncology

## 2020-04-02 ENCOUNTER — Other Ambulatory Visit: Payer: Medicare HMO

## 2020-04-03 ENCOUNTER — Other Ambulatory Visit: Payer: Medicare HMO

## 2020-04-08 DIAGNOSIS — E782 Mixed hyperlipidemia: Secondary | ICD-10-CM | POA: Diagnosis not present

## 2020-04-08 DIAGNOSIS — I10 Essential (primary) hypertension: Secondary | ICD-10-CM | POA: Diagnosis not present

## 2020-04-08 DIAGNOSIS — E1159 Type 2 diabetes mellitus with other circulatory complications: Secondary | ICD-10-CM | POA: Diagnosis not present

## 2020-04-08 DIAGNOSIS — M159 Polyosteoarthritis, unspecified: Secondary | ICD-10-CM | POA: Diagnosis not present

## 2020-04-09 ENCOUNTER — Ambulatory Visit: Payer: Medicare HMO

## 2020-04-09 ENCOUNTER — Other Ambulatory Visit: Payer: Self-pay

## 2020-04-09 VITALS — BP 133/101 | HR 74

## 2020-04-09 DIAGNOSIS — R2689 Other abnormalities of gait and mobility: Secondary | ICD-10-CM | POA: Diagnosis not present

## 2020-04-09 DIAGNOSIS — Z483 Aftercare following surgery for neoplasm: Secondary | ICD-10-CM | POA: Diagnosis not present

## 2020-04-09 NOTE — Therapy (Signed)
[place], [place], [address] Phone: [phone]   Fax:  [phone]  Physical Therapy Treatment  Patient Details  Name: Kristin Webb MRN: [id number] Date of Birth: 1946/07/21 Referring Provider (PT): Dr Alvy Bimler   Encounter Date: 04/09/2020   PT End of Session - 04/09/20 0818    Visit Number 14    Number of Visits 18    Date for PT Re-Evaluation 04/25/20    Authorization Type Approved 12 visits from 6/23-8/4; request sent from 03/14/20 to 05/04/20    Authorization - Visit Number 14    Authorization - Number of Visits 18    PT Start Time 0803    PT Stop Time 9242    PT Time Calculation (min) 54 min    Activity Tolerance Patient tolerated treatment well    Behavior During Therapy [facility] for tasks assessed/performed           Past Medical History:  Diagnosis Date  . Burning mouth syndrome   . Chronic fatigue   . Constipation   . Diabetes ([place]) 05/09/2018  . Diarrhea    in the past after gallbladder removal  . Fibromyalgia   . GERD (gastroesophageal reflux disease)   . History of kidney stones 07/2015  . Hyperlipidemia   . Hypertension    borderline not on meds   . Insomnia secondary to depression with anxiety   . Lumbar disc disease   . Ovarian cancer ([place]) 2014/2020   met nodule in abd  . Pelvic mass in female   . Pneumonia    hx of pneumonia as an infant  . PONV (postoperative nausea and vomiting)    pain from gas hernia 2017  . Shortness of breath    with exertion   . Sleep apnea    uses CPAP  . Yeast infection     Past Surgical History:  Procedure Laterality Date  . ABDOMINAL HYSTERECTOMY     early 62s  . APPENDECTOMY     WITH DEBULKING/BSO  . CHOLECYSTECTOMY     early 25s  . CYSTOSCOPY W/ URETERAL STENT PLACEMENT Left 07/09/2015   DUE TO NEPHROLITHIASIS Procedure: CYSTOSCOPY WITH LEFT RETROGRADE PYELOGRAM/ LEFT URETERAL STENT PLACEMENT;  Surgeon: Ardis Hughs, MD;   [facility];  Service: Urology;  Laterality: Left;  . HERNIA REPAIR    . INCISIONAL HERNIA REPAIR N/A 12/25/2015   WITH MESH Procedure:  INCISIONAL HERNIA REPAIR;  Surgeon: Ralene Ok, MD;  [facility];  Service: General;  Laterality: N/A;  . INCISIONAL HERNIA REPAIR N/A 10/21/2018   Procedure: LAPAROSCOPIC INCISIONAL HERNIA REPAIR WITH MESH;  Surgeon: Ralene Ok, MD;  Location: [place];  Service: General;  Laterality: N/A;  . INSERTION OF MESH N/A 12/25/2015   Procedure: INSERTION OF MESH;  Surgeon: Ralene Ok, MD;  [facility];  Service: General;  Laterality: N/A;  . LAPAROTOMY Bilateral 11/08/2012   Procedure: EXPLORATORY LAPAROTOMY BILATERAL SALPINGO OOPHORECTOMY TUMOR DEBULKING ;  Surgeon: Imagene Gurney A. Alycia Rossetti, MD;  [facility];  Service: Gynecology;  Laterality: Bilateral;  APPENDECTOMY / OMENTECTOMY  . LAPAROTOMY N/A 12/25/2015   Procedure: EXPLORATORY LAPAROTOMY;  Surgeon: Ralene Ok, MD;  [facility];  Service: General;  Laterality: N/A;  . LYSIS OF ADHESION N/A 12/25/2015   Procedure: LYSIS OF ADHESION;  Surgeon: Ralene Ok, MD;  [facility];  Service: General;  Laterality: N/A;  . PARTIAL HYSTERECTOMY  2006  . TRIGGER FINGER RELEASE      Vitals:  04/09/20 0808  BP: (!) 133/101  Pulse: 74     Subjective Assessment - 04/09/20 0809    Subjective I've been on my new BP meds from Dr. Leonides Schanz for about 2 weeks. My systolic number has been much more reasonable since then in the 130-140s. I'm managing okay with these. My energy is up and down though.    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  Recurrence found in 2019 with new nodules in the left lower quadrant and colon.  Just completed 6 sessions of chemotherapy and now on maintenance infusions every 3 weeks.  Fibromyalgia, Lumbar DDD, DM    Patient Stated Goals I'd like to stop bumping in to things.      Currently in Pain? Yes    Pain Score 4     Pain Location Back    Pain Orientation Lower    Pain Descriptors / Indicators Aching;Dull    Pain Type Chronic pain    Pain Onset More than a month ago    Pain Frequency Intermittent    Aggravating Factors  fibromyalgia but pain is always there    Pain Relieving Factors good body mechanics and just taking my time                             [facility] Adult PT Treatment/Exercise - 04/09/20 0001      Self-Care   Other Self-Care Comments  Assessed BP before and after session and pt wanted to weight herself. 188.2 lbs which she repotrs is improved.       Neuro Re-ed    Neuro Re-ed Details  In // bars: Front and retro heel-toe walking with fingertips along bar with eyes open; slow, controlled high knee marching with 2 lbs on ankles (VCs for slow pace), then on blue oval and 2 lbs on ankles for bil hip 3 way raises with cuing for controlledace and decrese trunk lean; on Airex for bil tandem stance for 30 sec with min-no HHA; walking along incline of black peds mat , then with Lt/Rt head turns and CGA by therapist on pts waist for both as she was more unsteady today but mostly able to self correct mild LOBs; finishe din // bars with eyes closed and fingertips along bars for front heel-toe walking, unable to do retro      Knee/Hip Exercises: Aerobic   Nustep (arms at setting 8) Level 4, x10 mins with therapist present to monitor pt throughout       Knee/Hip Exercises: Seated   Long Arc Quad Strengthening;Right;Left;15 reps    Long Arc Quad Weight 2 lbs.    Long Arc Quad Limitations VCs for slow, controlled pace    Marching Strengthening;Right;Left;15 reps   2# on each ankle                      PT Long Term Goals - 03/14/20 0815      PT LONG TERM GOAL #1   Title Pt will improve [place] ABS to 30/40    Baseline 34    Status Achieved      PT LONG TERM GOAL #2   Title Pt will demonstrate single leg stance time of  10 seconds on the left LE    Baseline 4 seconds; >12 sec with small LOB but able to self correct without putting foot down-03/04/20, on7/29/21 23 seconds  Status Achieved      PT LONG TERM GOAL #3   Title Pt will report no worsening of fatigue by the end of the day    Baseline No change with this as of yet, but pt has also been dealing with changing dosage of BP which may affect this-03/04/20; no change with end of day fatigue - 03/14/20    Time 4    Period Weeks    Status On-going      PT LONG TERM GOAL #4   Title Pt will be ind with final HEP    Baseline Pt is independent with current HEP at this time - 03/04/20    Time 4    Period Weeks    Status Partially Met      PT LONG TERM GOAL #5   Title Pt will be able to report increased standing tolerance due to decrease fatigue, of up to 30 minutes for meal prep and other ADLs.    Baseline 15-20 minutes - 03/14/20    Time 4    Period Weeks    Status New                 Plan - 04/09/20 0818    Clinical Impression Statement Pt returns to physical therapy after missing last week due to having increased dizziness and not feeling able to drive. Resumed endurance and high level balance activities today monitoring her BP before and after session. Her BP is much improved from sessions past with start being 133/101 and ending with 115/80. Her dizziness did seem more than usual today so had pt take seated rest breaks prn, but she was able to self correct most mild LOB and when not in // bars, CGA prevented further LOBs.    Personal Factors and Comorbidities Age;Fitness;Comorbidity 1    Comorbidities chemotherapy    Examination-Activity Limitations Locomotion Level    Examination-Participation Restrictions Yard Work;Community Activity    Stability/Clinical Decision Making Evolving/Moderate complexity    Rehab Potential Good    PT Frequency 1x / week    PT Duration 6 weeks    PT Treatment/Interventions ADLs/Self Care Home Management;Gait  training;Therapeutic exercise;Patient/family education;Manual techniques;Neuromuscular re-education    PT Next Visit Plan Assess BP before and during session prn; continue with high level balance activities including head turns, and endurance acitivities, like the NuStep, progressing HEP prn    PT Home Exercise Plan Straight leg raises in standing with theraband; begin daily walking routine    Consulted and Agree with Plan of Care Patient           Patient will benefit from skilled therapeutic intervention in order to improve the following deficits and impairments:  Decreased activity tolerance, Difficulty walking, Decreased balance, Postural dysfunction, Impaired flexibility, Decreased coordination, Decreased strength  Visit Diagnosis: Other abnormalities of gait and mobility  Aftercare following surgery for neoplasm     Problem List Patient Active Problem List   Diagnosis Date Noted  . Frequent headaches 03/13/2020  . Balance problems 01/29/2020  . Sinus congestion 01/29/2020  . Bilateral cataracts 12/12/2019  . UTI (urinary tract infection) 11/22/2019  . Dysuria 11/20/2019  . 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 Thrombocytopenia ([place]) 09/08/2019  . Fibromyalgia 09/08/2019  . Goals of care, counseling/discussion 08/08/2019  . Excessive body weight gain 05/09/2018  . Neoplastic malignant related fatigue 05/09/2018  . OSA on CPAP 05/09/2018  . Newly diagnosed diabetes ([place]) 05/09/2018  . Hypercalcemia 07/21/2016  . Other fatigue 07/15/2016  .  Excessive daytime sleepiness 05/18/2016  . Abdominal wall hernia 05/18/2016  . Activity intolerance related to fatigue 05/18/2016  . S/P hernia repair 12/25/2015  . BRCA negative 09/25/2015  . Pyelonephritis 07/09/2015  . Nephrolithiasis 07/09/2015  . Insomnia secondary to depression with anxiety 12/24/2014  . Gastritis 12/20/2014  . Port catheter in place 12/20/2014  . Insomnia 12/12/2013  . Obesity (BMI  35.0-39.9 without comorbidity) 12/12/2013  . OSA (obstructive sleep apnea) 11/24/2013  . Burning mouth syndrome 11/24/2013  . Ventral hernia 04/18/2013  . Essential hypertension 04/18/2013  . 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

## 2020-04-10 ENCOUNTER — Encounter: Payer: Self-pay | Admitting: General Practice

## 2020-04-10 NOTE — Progress Notes (Signed)
[place] Note  Hardie Lora by phone per referral from [place]/LCSW for spiritual/emotional support. Provided empathic listening, pastoral reflection, emotional/spiritual support, and affirmation of strengths. One area of struggle for Kristin Webb is having to attend appointments alone (in part because this stirs up grief at the death of her husband and in part because of frustration with limitations due to covid precautions), so we plan for me to connect with her as a pastoral presence at her appointments on 9/9. She also has my direct dial number and knows to contact chaplain as needed/desired.   [place], [place], [facility] Pager [phone] Voicemail [phone]

## 2020-04-16 ENCOUNTER — Ambulatory Visit: Payer: Medicare HMO | Attending: Hematology and Oncology

## 2020-04-16 ENCOUNTER — Other Ambulatory Visit: Payer: Self-pay

## 2020-04-16 VITALS — BP 141/83 | HR 66

## 2020-04-16 DIAGNOSIS — Z483 Aftercare following surgery for neoplasm: Secondary | ICD-10-CM | POA: Diagnosis not present

## 2020-04-16 DIAGNOSIS — R2689 Other abnormalities of gait and mobility: Secondary | ICD-10-CM | POA: Diagnosis not present

## 2020-04-16 NOTE — Therapy (Signed)
[place], [place], [address] Phone: [phone]   Fax:  [phone]  Physical Therapy Treatment  Patient Details  Name: Kristin Webb MRN: [id number] Date of Birth: [birth year] Referring Provider (PT): Dr Alvy Bimler   Encounter Date: 04/16/2020   PT End of Session - 04/16/20 0958    Visit Number 15    Number of Visits 18    Date for PT Re-Evaluation 04/25/20    Authorization Type Approved 12 visits from 6/23-8/4; request sent from 03/14/20 to 05/04/20    Authorization - Visit Number 15    Authorization - Number of Visits 18    PT Start Time 0906    PT Stop Time 1004    PT Time Calculation (min) 58 min    Activity Tolerance Patient tolerated treatment well    Behavior During Therapy [facility] for tasks assessed/performed           Past Medical History:  Diagnosis Date  . Burning mouth syndrome   . Chronic fatigue   . Constipation   . Diabetes ([place]) 05/09/2018  . Diarrhea    in the past after gallbladder removal  . Fibromyalgia   . GERD (gastroesophageal reflux disease)   . History of kidney stones 07/2015  . Hyperlipidemia   . Hypertension    borderline not on meds   . Insomnia secondary to depression with anxiety   . Lumbar disc disease   . Ovarian cancer ([place]) 2014/2020   met nodule in abd  . Pelvic mass in female   . Pneumonia    hx of pneumonia as an infant  . PONV (postoperative nausea and vomiting)    pain from gas hernia 2017  . Shortness of breath    with exertion   . Sleep apnea    uses CPAP  . Yeast infection     Past Surgical History:  Procedure Laterality Date  . ABDOMINAL HYSTERECTOMY     early 52s  . APPENDECTOMY     WITH DEBULKING/BSO  . CHOLECYSTECTOMY     early 70s  . CYSTOSCOPY W/ URETERAL STENT PLACEMENT Left 07/09/2015   DUE TO NEPHROLITHIASIS Procedure: CYSTOSCOPY WITH LEFT RETROGRADE PYELOGRAM/ LEFT URETERAL STENT PLACEMENT;  Surgeon: Ardis Hughs, MD;   [facility];  Service: Urology;  Laterality: Left;  . HERNIA REPAIR    . INCISIONAL HERNIA REPAIR N/A 12/25/2015   WITH MESH Procedure:  INCISIONAL HERNIA REPAIR;  Surgeon: Ralene Ok, MD;  [facility];  Service: General;  Laterality: N/A;  . INCISIONAL HERNIA REPAIR N/A 10/21/2018   Procedure: LAPAROSCOPIC INCISIONAL HERNIA REPAIR WITH MESH;  Surgeon: Ralene Ok, MD;  Location: [place];  Service: General;  Laterality: N/A;  . INSERTION OF MESH N/A 12/25/2015   Procedure: INSERTION OF MESH;  Surgeon: Ralene Ok, MD;  [facility];  Service: General;  Laterality: N/A;  . LAPAROTOMY Bilateral 11/08/2012   Procedure: EXPLORATORY LAPAROTOMY BILATERAL SALPINGO OOPHORECTOMY TUMOR DEBULKING ;  Surgeon: Imagene Gurney A. Alycia Rossetti, MD;  [facility];  Service: Gynecology;  Laterality: Bilateral;  APPENDECTOMY / OMENTECTOMY  . LAPAROTOMY N/A 12/25/2015   Procedure: EXPLORATORY LAPAROTOMY;  Surgeon: Ralene Ok, MD;  [facility];  Service: General;  Laterality: N/A;  . LYSIS OF ADHESION N/A 12/25/2015   Procedure: LYSIS OF ADHESION;  Surgeon: Ralene Ok, MD;  [facility];  Service: General;  Laterality: N/A;  . PARTIAL HYSTERECTOMY  2006  . TRIGGER FINGER RELEASE      Vitals:  04/16/20 0910  BP: (!) 141/83  Pulse: 66     Subjective Assessment - 04/16/20 0911    Subjective My BP is doing okay. My BP is still fluctuating during the day, but less so and it isn't getting into the 782'U in the systolic anymore. Has only gone up to 151 once, but retook it 15 mins later and even that went back down 145. My diastolic has been doing well in the 80-90's.    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  Recurrence found in 2019 with new nodules in the left lower quadrant and colon.  Just completed 6 sessions of chemotherapy and now on maintenance infusions every 3 weeks.  Fibromyalgia, Lumbar  DDD, DM    Patient Stated Goals I'd like to stop bumping in to things.    Currently in Pain? Yes    Pain Score 3     Pain Location Back    Pain Orientation Lower    Pain Descriptors / Indicators Aching;Dull    Pain Type Chronic pain    Pain Onset More than a month ago    Pain Frequency Intermittent    Aggravating Factors  I think it's just when I overdo    Pain Relieving Factors resting                             [facility] Adult PT Treatment/Exercise - 04/16/20 0001      Neuro Re-ed    Neuro Re-ed Details  In // bars: Front and retro heel-toe walking with eyes closed and fingertips along bar; [place] to Rt and Lt 2x each with fingertips on bars; multiple seated rest breaks today as pt reports increased LE fatigue      Knee/Hip Exercises: Aerobic   Nustep (arms at setting 8) Level 5, x10 mins with therapist present to monitor pt throughout       Knee/Hip Exercises: Machines for Strengthening   Cybex Leg Press 20# x1, easy so increased to 25# for 2x10 with pt returning therapist instruction, good technique returned      Knee/Hip Exercises: Standing   Hip Flexion Stengthening;Right;Left;15 reps   in // bars on Airex, 2# on each ankle    Hip Flexion Limitations fingertip support throughout, encouarged to use hands as little as possible    Forward Lunges Right;Left;2 sets;5 reps   in // bars, down and back   Hip Abduction Stengthening;Right;Left;15 reps   in / bars on Airex, 2# on each ankle   Abduction Limitations fingertips support throughout    Hip Extension Stengthening;Right;Left;15 reps   in // bars on Airex, 2# on each ankle                      PT Long Term Goals - 03/14/20 0815      PT LONG TERM GOAL #1   Title Pt will improve [place] ABS to 30/40    Baseline 34    Status Achieved      PT LONG TERM GOAL #2   Title Pt will demonstrate single leg stance time of 10 seconds on the left LE    Baseline 4 seconds; >12 sec with small LOB but  able to self correct without putting foot down-03/04/20, on7/29/21 23 seconds    Status Achieved      PT LONG TERM GOAL #3   Title Pt will  report no worsening of fatigue by the end of the day    Baseline No change with this as of yet, but pt has also been dealing with changing dosage of BP which may affect this-03/04/20; no change with end of day fatigue - 03/14/20    Time 4    Period Weeks    Status On-going      PT LONG TERM GOAL #4   Title Pt will be ind with final HEP    Baseline Pt is independent with current HEP at this time - 03/04/20    Time 4    Period Weeks    Status Partially Met      PT LONG TERM GOAL #5   Title Pt will be able to report increased standing tolerance due to decrease fatigue, of up to 30 minutes for meal prep and other ADLs.    Baseline 15-20 minutes - 03/14/20    Time 4    Period Weeks    Status New                 Plan - 04/16/20 1000    Clinical Impression Statement Pt came in reporting some increased fatigue noted in bil LEs. Extra seated rest breaks taken today between exercises due to this. Overall her BP is fluctuating less and staying closer to normal ranges than weeks past (after session today was 144/81 and HR 69 bpm). Decide if renewal or D/C in next few sessions. Also reviewed importance of getting daily walking routine going to help further promote her improved endurance. Pt verbalized understanding.    Personal Factors and Comorbidities Age;Fitness;Comorbidity 1    Comorbidities chemotherapy    Examination-Activity Limitations Locomotion Level    Examination-Participation Restrictions Yard Work;Community Activity    Stability/Clinical Decision Making Evolving/Moderate complexity    Rehab Potential Good    PT Frequency 1x / week    PT Duration 6 weeks    PT Treatment/Interventions ADLs/Self Care Home Management;Gait training;Therapeutic exercise;Patient/family education;Manual techniques;Neuromuscular re-education    PT Next Visit Plan  Decide renewal vs. D/C next session. Assess BP before and during session prn; continue with high level balance activities including head turns, and endurance acitivities, like the NuStep, progressing HEP prn    PT Home Exercise Plan Straight leg raises in standing with theraband; begin daily walking routine    Consulted and Agree with Plan of Care Patient           Patient will benefit from skilled therapeutic intervention in order to improve the following deficits and impairments:  Decreased activity tolerance, Difficulty walking, Decreased balance, Postural dysfunction, Impaired flexibility, Decreased coordination, Decreased strength  Visit Diagnosis: Other abnormalities of gait and mobility  Aftercare following surgery for neoplasm     Problem List Patient Active Problem List   Diagnosis Date Noted  . Frequent headaches 03/13/2020  . Balance problems 01/29/2020  . Sinus congestion 01/29/2020  . Bilateral cataracts 12/12/2019  . UTI (urinary tract infection) 11/22/2019  . Dysuria 11/20/2019  . 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 Thrombocytopenia ([place]) 09/08/2019  . Fibromyalgia 09/08/2019  . Goals of care, counseling/discussion 08/08/2019  . Excessive body weight gain 05/09/2018  . Neoplastic malignant related fatigue 05/09/2018  . OSA on CPAP 05/09/2018  . Newly diagnosed diabetes ([place]) 05/09/2018  . Hypercalcemia 07/21/2016  . Other fatigue 07/15/2016  . Excessive daytime sleepiness 05/18/2016  . Abdominal wall hernia 05/18/2016  . Activity intolerance related to fatigue 05/18/2016  . S/P hernia  repair 12/25/2015  . BRCA negative 09/25/2015  . Pyelonephritis 07/09/2015  . Nephrolithiasis 07/09/2015  . Insomnia secondary to depression with anxiety 12/24/2014  . Gastritis 12/20/2014  . Port catheter in place 12/20/2014  . Insomnia 12/12/2013  . Obesity (BMI 35.0-39.9 without comorbidity) 12/12/2013  . OSA (obstructive sleep apnea)  11/24/2013  . Burning mouth syndrome 11/24/2013  . Ventral hernia 04/18/2013  . Essential hypertension 04/18/2013  . 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

## 2020-04-18 ENCOUNTER — Inpatient Hospital Stay: Payer: Medicare HMO

## 2020-04-18 ENCOUNTER — Encounter: Payer: Self-pay | Admitting: General Practice

## 2020-04-18 ENCOUNTER — Other Ambulatory Visit: Payer: Self-pay

## 2020-04-18 ENCOUNTER — Inpatient Hospital Stay: Payer: Medicare HMO | Attending: Gynecologic Oncology

## 2020-04-18 ENCOUNTER — Encounter: Payer: Self-pay | Admitting: Hematology and Oncology

## 2020-04-18 ENCOUNTER — Inpatient Hospital Stay (HOSPITAL_BASED_OUTPATIENT_CLINIC_OR_DEPARTMENT_OTHER): Payer: Medicare HMO | Admitting: Hematology and Oncology

## 2020-04-18 VITALS — BP 123/69 | HR 64 | Resp 18

## 2020-04-18 VITALS — BP 122/88 | HR 63 | Temp 97.4°F | Resp 18 | Ht 62.0 in | Wt 191.0 lb

## 2020-04-18 DIAGNOSIS — Z7189 Other specified counseling: Secondary | ICD-10-CM

## 2020-04-18 DIAGNOSIS — C5700 Malignant neoplasm of unspecified fallopian tube: Secondary | ICD-10-CM | POA: Insufficient documentation

## 2020-04-18 DIAGNOSIS — R2689 Other abnormalities of gait and mobility: Secondary | ICD-10-CM

## 2020-04-18 DIAGNOSIS — Z79899 Other long term (current) drug therapy: Secondary | ICD-10-CM | POA: Diagnosis not present

## 2020-04-18 DIAGNOSIS — Z90722 Acquired absence of ovaries, bilateral: Secondary | ICD-10-CM | POA: Insufficient documentation

## 2020-04-18 DIAGNOSIS — Z5112 Encounter for antineoplastic immunotherapy: Secondary | ICD-10-CM | POA: Diagnosis not present

## 2020-04-18 DIAGNOSIS — Z90710 Acquired absence of both cervix and uterus: Secondary | ICD-10-CM | POA: Insufficient documentation

## 2020-04-18 DIAGNOSIS — Z9049 Acquired absence of other specified parts of digestive tract: Secondary | ICD-10-CM | POA: Insufficient documentation

## 2020-04-18 DIAGNOSIS — C5701 Malignant neoplasm of right fallopian tube: Secondary | ICD-10-CM

## 2020-04-18 DIAGNOSIS — D696 Thrombocytopenia, unspecified: Secondary | ICD-10-CM

## 2020-04-18 DIAGNOSIS — Z9079 Acquired absence of other genital organ(s): Secondary | ICD-10-CM | POA: Insufficient documentation

## 2020-04-18 DIAGNOSIS — I10 Essential (primary) hypertension: Secondary | ICD-10-CM | POA: Diagnosis not present

## 2020-04-18 DIAGNOSIS — Z7984 Long term (current) use of oral hypoglycemic drugs: Secondary | ICD-10-CM | POA: Diagnosis not present

## 2020-04-18 DIAGNOSIS — C9200 Acute myeloblastic leukemia, not having achieved remission: Secondary | ICD-10-CM | POA: Diagnosis not present

## 2020-04-18 LAB — CMP (CANCER CENTER ONLY)
ALT: 29 U/L (ref 0–44)
AST: 25 U/L (ref 15–41)
Albumin: 4.1 g/dL (ref 3.5–5.0)
Alkaline Phosphatase: 79 U/L (ref 38–126)
Anion gap: 7 (ref 5–15)
BUN: 14 mg/dL (ref 8–23)
CO2: 30 mmol/L (ref 22–32)
Calcium: 9.3 mg/dL (ref 8.9–10.3)
Chloride: 101 mmol/L (ref 98–111)
Creatinine: 0.74 mg/dL (ref 0.44–1.00)
GFR, Est AFR Am: >60 mL/min (ref >60)
GFR, Estimated: >60 mL/min (ref >60)
Glucose, Bld: 104 mg/dL — ABNORMAL HIGH (ref 70–99)
Potassium: 3.9 mmol/L (ref 3.5–5.1)
Sodium: 138 mmol/L (ref 135–145)
Total Bilirubin: 0.4 mg/dL (ref 0.3–1.2)
Total Protein: 7.1 g/dL (ref 6.5–8.1)

## 2020-04-18 LAB — CBC WITH DIFFERENTIAL (CANCER CENTER ONLY)
Abs Immature Granulocytes: 0.01 10*3/uL (ref 0.00–0.07)
Basophils Absolute: 0 10*3/uL (ref 0.0–0.1)
Basophils Relative: 1 %
Eosinophils Absolute: 0.1 10*3/uL (ref 0.0–0.5)
Eosinophils Relative: 3 %
HCT: 40 % (ref 36.0–46.0)
Hemoglobin: 13.4 g/dL (ref 12.0–15.0)
Immature Granulocytes: 0 %
Lymphocytes Relative: 35 %
Lymphs Abs: 1.7 10*3/uL (ref 0.7–4.0)
MCH: 30.5 pg (ref 26.0–34.0)
MCHC: 33.5 g/dL (ref 30.0–36.0)
MCV: 91.1 fL (ref 80.0–100.0)
Monocytes Absolute: 0.5 10*3/uL (ref 0.1–1.0)
Monocytes Relative: 10 %
Neutro Abs: 2.6 10*3/uL (ref 1.7–7.7)
Neutrophils Relative %: 51 %
Platelet Count: 130 10*3/uL — ABNORMAL LOW (ref 150–400)
RBC: 4.39 MIL/uL (ref 3.87–5.11)
RDW: 12.8 % (ref 11.5–15.5)
WBC Count: 4.9 10*3/uL (ref 4.0–10.5)
nRBC: 0 % (ref 0.0–0.2)

## 2020-04-18 LAB — TOTAL PROTEIN, URINE DIPSTICK: Protein, ur: NEGATIVE mg/dL

## 2020-04-18 MED ORDER — SODIUM CHLORIDE 0.9 % IV SOLN
Freq: Once | INTRAVENOUS | Status: AC
  Filled 2020-04-18: qty 250

## 2020-04-18 MED ORDER — SODIUM CHLORIDE 0.9% FLUSH
10.0000 mL | INTRAVENOUS | Status: DC | PRN
  Administered 2020-04-18: 10 mL
  Filled 2020-04-18: qty 10

## 2020-04-18 MED ORDER — SODIUM CHLORIDE 0.9 % IV SOLN
15.0000 mg/kg | Freq: Once | INTRAVENOUS | Status: AC
  Administered 2020-04-18: 1300 mg via INTRAVENOUS
  Filled 2020-04-18: qty 48

## 2020-04-18 MED ORDER — HEPARIN SOD (PORK) LOCK FLUSH 100 UNIT/ML IV SOLN
500.0000 [IU] | Freq: Once | INTRAVENOUS | Status: AC | PRN
  Administered 2020-04-18: 500 [IU]
  Filled 2020-04-18: qty 5

## 2020-04-18 MED ORDER — SODIUM CHLORIDE 0.9% FLUSH
10.0000 mL | Freq: Once | INTRAVENOUS | Status: AC
  Administered 2020-04-18: 10 mL
  Filled 2020-04-18: qty 10

## 2020-04-18 NOTE — Assessment & Plan Note (Signed)
She feels better Her blood pressure is under controlled She has no evidence of proteinuria We will continue treatment as scheduled When I see her next, I will order CT imaging at the end of September for objective assessment of response to therapy

## 2020-04-18 NOTE — Patient Instructions (Signed)
[facility] Discharge Instructions for Patients Receiving Chemotherapy  Today you received the following chemotherapy agents Bevacizumab  To help prevent nausea and vomiting after your treatment, we encourage you to take your nausea medication as directed   If you develop nausea and vomiting that is not controlled by your nausea medication, call the clinic.   BELOW ARE SYMPTOMS THAT SHOULD BE REPORTED IMMEDIATELY:  *FEVER GREATER THAN 100.5 F  *CHILLS WITH OR WITHOUT FEVER  NAUSEA AND VOMITING THAT IS NOT CONTROLLED WITH YOUR NAUSEA MEDICATION  *UNUSUAL SHORTNESS OF BREATH  *UNUSUAL BRUISING OR BLEEDING  TENDERNESS IN MOUTH AND THROAT WITH OR WITHOUT PRESENCE OF ULCERS  *URINARY PROBLEMS  *BOWEL PROBLEMS  UNUSUAL RASH Items with * indicate a potential emergency and should be followed up as soon as possible.  Feel free to call the clinic should you have any questions or concerns. The clinic phone number is [phone].  Please show the CHEMO ALERT CARD at check-in to the Emergency Department and triage nurse.   

## 2020-04-18 NOTE — Assessment & Plan Note (Signed)
Her blood pressure control is better Per previous discussion, her primary care doctor will manage her blood pressure medications from now on

## 2020-04-18 NOTE — Assessment & Plan Note (Signed)
Her thrombocytopenia is stable Observe only for now 

## 2020-04-18 NOTE — Assessment & Plan Note (Signed)
Her balance is likely affected by recent chemotherapy She will continue physical therapy and rehab

## 2020-04-18 NOTE — Progress Notes (Signed)
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[facility]) She feels better Her blood pressure is under controlled She has no evidence of proteinuria We will continue treatment as scheduled When I see her next, I will order CT imaging at the end of September for objective assessment of response to therapy  Thrombocytopenia ([place]) Her thrombocytopenia is stable Observe only for now  Essential hypertension Her blood pressure control is better Per previous discussion, her primary care doctor will manage her blood pressure medications from now on  Balance problems Her balance is likely affected by recent chemotherapy She will continue physical therapy and rehab   Orders Placed This Encounter  Procedures  . CT ABDOMEN PELVIS W CONTRAST    Standing Status:   Future    Standing Expiration Date:   04/18/2021    Order Specific Question:   If indicated for the ordered procedure, I authorize the administration of contrast media per Radiology protocol    Answer:   Yes    Order Specific Question:   Preferred imaging location?    Answer:   [facility]    Order Specific Question:   Radiology Contrast Protocol - do NOT remove file path    Answer:   [email or web address]    All questions were answered. The patient knows to call the clinic with any problems, questions or concerns. The total time spent in the appointment was 25 minutes encounter with patients including review of chart and various tests results, discussions about plan of care and coordination of care plan   [name], MD 04/18/2020 10:35 AM  INTERVAL HISTORY: Please see below for problem oriented charting. She returns for treatment and follow-up She has presence of the chaplain as additional emotional  support She complained of fatigue Her blood pressure at home is better controlled She continues to struggle with balance difficulties but she is working with therapist She have seen her eye doctor recently with new prescription glasses She has occasional burning sensation in the mouth which she attributed to some form of weird neuropathy Denies abdominal pain She have chronic loose stool that is unchanged  SUMMARY OF ONCOLOGIC HISTORY: Oncology History Overview Note  Oncologic Summary: 1. 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 Random CA 125 elevation January 2019  Question mesenteric nodules and anterior abdominal wall nodule 3. GeneDx Breast/Ovary Panel negative (including BRCA, MMR's, RAD51 etc) ? [facility] BRACAnalysis  Negative for BRCA1/2 in tumor   Fallopian tube carcinoma ([place])  11/01/2012 Imaging   Ct abdomen 1.  Interval development of large mid abdominal mass highly concerning for right ovarian cancer.  There is mild omental nodularity on the left, and peritoneal disease cannot be completely excluded.  There is no ascites or other evidence of metastatic disease. 2.  Mild associated renal pelvocaliectasis bilaterally without obstruction.  Nonobstructing left renal calculus and a small right renal angiomyolipoma noted incidentally.   11/08/2012 Pathology Results   1. 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 - BENIGN FALLOPIAN TUBE WITH NONSPECIFIC CHRONIC INFLAMMATION.  2. Ovary and fallopian tube, left - BENIGN OVARY; NEGATIVE FOR ATYPIA OR MALIGNANCY. - BENIGN FALLOPIAN TUBE; NEGATIVE  FOR ATYPIA OR MALIGNANCY. 3. Omentum, resection for tumor - HIGH GRADE CARCINOMA, SEE COMMENT. 4. Appendix, Other than Incidental - FIBROUS OBLITERATION OF APPENDICEAL TIP. - NEGATIVE FOR MALIGNANCY.   11/08/2012 Surgery   Surgery: Exploratory laparotomy, bilateral salpingo-oophorectomy, appendectomy, infacolic omentectomy, optimal debulking  Surgeons:  Paola A. Alycia Rossetti, MD; Lahoma Crocker, MD   Assistant: Caswell Corwin  Pathology: Bilateral fallopian tubes and ovaries to pathology. Appendix as well as omentum. 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  Operative findings: 25 cm right adnexal mass with smooth surface. Surgically absent uterus. Atrophic-appearing left ovary. Normal appearing appendix. Within the omentum there were centimeter nodules scattered throughout the omentum. The remainder of the surfaces were benign.   12/08/2012 Procedure   Impression:  Placement of a subcutaneous port device.  The catheter tip is in the lower SVC and ready to be used.    12/13/2012 - 03/28/2013 Chemotherapy   s/p 6 cycles of paclitaxel and carboplatin   12/13/2012 - 03/28/2013 Chemotherapy   The patient had 6 cycles of carboplatin and Taxol   03/24/2013 Imaging   US abdomen   04/24/2013 Imaging   CT abdomen Interval resection of the large right pelvic and lower abdominal mass lesion with apparent omentectomy.  No evidence for intraperitoneal free fluid on today's study.  No discernible peritoneal lesions.  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   09/07/2013 Genetic Testing   Patient has genetic testing done for BRCA1/2 panel Results revealed patient has no mutation(s):   07/09/2015 Imaging   CT abdomen 1. 12 mm obstructive calculus at the left ureteropelvic junction with moderate proximal hydronephrosis. 2. 2 small supraumbilical ventral hernias, one containing a short segment of the mid transverse  colon and the other containing a short segment of the mid small bowel. There is no associated evidence to suggest bowel incarceration or obstruction at this time. 3. Tiny locule of gas non dependently in the lumen of the urinary bladder. This is presumably iatrogenic related to recent catheterization for urinalysis. Alternatively, this could be seen in the setting of urinary tract infection with gas-forming organisms. Clinical correlation for history of recent catheterization is recommended. 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 5. Status post cholecystectomy. 6. Additional incidental findings, as above.    12/11/2016 Imaging   Ct abdomen 1. No evidence of metastatic ovarian cancer. 2. Recurrent subxiphoid ventral abdominal wall hernia containing transverse colon. No evidence of incarceration or obstruction. 3. Stable incidental findings in the liver and kidneys. No recurrent urinary tract calculus. 4. Progressive lower lumbar spondylosis. 5.  Aortic Atherosclerosis (ICD10-I70.0).    08/30/2017 Imaging   MRI thoracic spine 1. At T5-6 there is a small central disc protrusion contacting the ventral thoracic spinal cord. No central canal or foraminal stenosis. 2. At T9-10 there is a small right paracentral disc protrusion. 3.  No acute osseous injury of the thoracic spine. 4. No aggressive osseous lesion to suggest metastatic disease.   09/24/2017 Tumor Marker   Patient's tumor was tested for the following markers: CA-125 Results of the tumor marker test revealed 21.7   09/30/2017 Imaging   CT abdomen 1. New small clustered soft tissue nodules in the left lower quadrant in the sigmoid mesentery, largest 1.0 cm, which could represent recurrent peritoneal tumor implants. No ascites.  2. Midline high ventral abdominal wall hernia containing a portion of the transverse colon is mildly increased in size, and without bowel complication at this time. 3. Chronic findings  include: Aortic Atherosclerosis (ICD10-I70.0). Diffuse hepatic steatosis. 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   10/11/2017 PET scan   1. Nodules in the sigmoid mesentery are hypermetabolic and highly worrisome for metastatic disease. 2. Attic steatosis.    11/03/2017 Procedure   Successful CT-guided rectus abdominal muscle mass core biopsy. Path: - FOREIGN BODY GIANT CELL REACTION INVOLVING FIBROADIPOSE TISSUE AND SKELETAL MUSCLE. - NO EVIDENCE OF MALIGNANCY.   11/04/2017 Cancer Staging   Staging form: Fallopian Tube, AJCC 7th Edition - Clinical: FIGO Stage IIIC, calculated as Stage IV (rT3, N0, M1) - Signed by [name], MD on 08/09/2019   02/2018 Imaging   CT: 1. Continued increase in size small peritoneal nodules along the mesenteric border of the proximal sigmoid colon as well as along the serosal surface of the proximal sigmoid colon. 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 2. No evidence of distant disease. 3. Stable large ventral hernia.   05/2018 Imaging   PET: 1. Redemonstration of hypermetabolic nodules within the sigmoid mesocolon. Mild response to therapy relative to CT of 02/24/2018. Mixed response to therapy compared to the most recent PET of 10/11/2017. 2. Hypermetabolism within the right pelvic rectus musculature, increased since the prior PET.  Clinical service requested comparison to the 11/24/2017 CT. Index 10 mm nodule within the sigmoid mesocolon was similar to the 02/24/2018 CT, and as described on that exam, increased from 7 mm on 11/24/2017. More inferior nodule within the mesocolon measures 12 mm today on image 156/4 and 8 mm on 11/24/2017.   05/2018 Imaging   CT: IMPRESSION: 1. Since 02/24/2018, decreased size of peritoneal nodules centered in the sigmoid mesocolon. 2. No evidence of new or progressive disease. 3. Hepatic steatosis. 4. Subcentimeter right renal  angiomyolipoma, similar. 5.  Aortic Atherosclerosis (ICD10-I70.0). 6. Ventral abdominal wall laxity containing transverse colon, similar.   08/2018 Imaging   CT: IMPRESSION: 1. Nodules within the sigmoid mesocolon have decreased in size compared to prior. No new peritoneal or omental nodularity. 2. No evidence local recurrence the pelvis. 3. Ventral hernia contains a segment of transverse colon. No change from prior.     06/19/2019 Imaging   1. No evidence of metastatic disease in the abdomen pelvis. 2. No peritoneal or omental metastasis identified.  No free fluid. 3. Postcholecystectomy and hysterectomy. Comparison exams are made available. Comparison CT 09/08/2018 and 05/27/2018. PET-CT 05/27/2018    There is a nodule within the proximal aspect of the sigmoid colon measuring 2.2 by 2.2 cm. In comparison to prior CTs and PET-CT there was a hypermetabolic nodule at this location on the PET-CT of 08/27/2017 and on the CT of 09/08/2018 there was a small residual nodule. At that time (09/08/2018) the nodule measured 1.4 by 1.3 cm. Therefore this nodule has increased in the interval and concerning for recurrence of a serosal implant. The previous described lymph nodes in the sigmoid mesocolon and more central mesentery are not increased in size and not pathologic by size criteria.    Concern for recurrence of serosal metastasis in the proximal sigmoid colon with a 2 cm enlarging lesion. Lesion extends into the lumen. No evidence of high-grade obstruction. Consider FDG PET scan and/or potential colonoscopy for evaluation.   06/29/2019 PET scan   1. Enlarging serosal implant within the  proximal sigmoid colon has intense metabolic activity most consistent with malignancy. Lesion exhibits a somewhat indolent progression as present on PET-CT scan from 10/11/2017 and 05/27/2018. 2. Focal hypermetabolic activity within the RIGHT rectus muscle just off midline is slightly decreased from comparison exam.  This may be benign inflammation related to prior laparotomy, however malignancy not excluded.   07/26/2019 Procedure   She had colonoscopy which showed multiple polyps.  6 polyps were removed from the cecum, measuring 3 to 6 mm in size.  One 12 mm polyp was removed from ascending colon and another 1 measures 7 mm.  One 5 mm polyp was removed from the transverse colon.  There is tumor noted in the sigmoid colon, approximately 35 cm from the anus which was biopsied.  The tumor appeared to be fungating, infiltrative and ulcerated but nonobstructive.  It encompassed approximately one third of the circumference of the lumen.   07/26/2019 Pathology Results   Multiple polyps came back tubular adenomas.  2 ascending polyps came back sessile serrated adenoma months.  Sigmoid colon biopsy confirmed metastatic high-grade serous carcinoma.  The morphology and immunophenotype are consistent with metastatic high-grade serous carcinoma from tubal/ovarian primary site.   08/17/2019 - 12/12/2019 Chemotherapy   The patient had carboplatin and taxol   10/23/2019 Imaging   1. Sigmoid lesion with diminished size/conspicuity difficult to measure on the previous exam. Adjacent lymph nodes and nodularity less than a cm, largest on coronal image 48 measuring 5 mm. 2. Right rectus muscle with some thickening with mildly convex margin seen posteriorly on image 72 of series 2. This could be due to postsurgical change, however, metastatic disease is not excluded. Attention on follow-up is suggested. 3. No evidence for new metastatic disease. 4. Small hiatal hernia. 5. 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   Aortic Atherosclerosis (ICD10-I70.0).   01/01/2020 Imaging   1. No signs of new disease. 2. Tiny lymph nodes in the area of concern in the LEFT lower quadrant z. 3. Added density and subtle contour irregularity involving the rectus muscles best seen on sagittal images, associated with site of prior surgical incision  just to the RIGHT of midline (image 72, series 2 and image 58, series 5. Not significantly changed compared to prior studies. This measures approximately 2.1 x 1 cm in the sagittal plane and is less well-defined in the axial plane. Potentially postoperative change. Attention on follow-up. 4. Aortic atherosclerosis.   Aortic Atherosclerosis (ICD10-I70.0).       01/29/2020 -  Chemotherapy   The patient had bevacizumab-bvzr (ZIRABEV) 1,300 mg in sodium chloride 0.9 % 100 mL chemo infusion, 15 mg/kg = 1,300 mg, Intravenous,  Once, 3 of 5 cycles Administration: 1,300 mg (01/29/2020), 1,300 mg (03/27/2020), 1,300 mg (02/19/2020)  for chemotherapy treatment.      REVIEW OF SYSTEMS:   Constitutional: Denies fevers, chills or abnormal weight loss Ears, nose, mouth, throat, and face: Denies mucositis or sore throat Respiratory: Denies cough, dyspnea or wheezes Cardiovascular: Denies palpitation, chest discomfort or lower extremity swelling Gastrointestinal:  Denies nausea, heartburn or change in bowel habits Skin: Denies abnormal skin rashes Lymphatics: Denies new lymphadenopathy or easy bruising Behavioral/Psych: Mood is stable, no new changes  All other systems were reviewed with the patient and are negative.  I have reviewed the past medical history, past surgical history, social history and family history with the patient and they are unchanged from previous note.  ALLERGIES:  is allergic to [employer] tartrate], bee venom, and cortisone.  MEDICATIONS:  Current Outpatient Medications  Medication Sig Dispense Refill  . amLODipine (NORVASC) 10 MG tablet Take 1 tablet (10 mg total) by mouth daily. 30 tablet 11  . Armodafinil 250 MG tablet Take 1 tablet (250 mg total) by mouth daily. 90 tablet 1  . Carboxymethylcellulose Sodium (EYE DROPS OP) Apply 2 drops to eye daily as needed (dry eye).    . cholecalciferol (VITAMIN D3) 25 MCG (1000 UT) tablet Take 1,000 Units by mouth daily.    .  colestipol (COLESTID) 1 g tablet Take 0.5-1 g by mouth daily. Pt taking 1/2 to 1 tablet daily depending on meal choice; taken 1 hour after morning meds , and can not eat for another hour    . EPINEPHrine (EPIPEN 2-PAK) 0.3 mg/0.3 mL DEVI Inject 0.3 mg into the muscle once as needed (For bee stings.). Reported on 01/20/2016    . eszopiclone (LUNESTA) 2 MG TABS tablet TAKE ONE TABLET BY MOUTH EVERYDAY AT BEDTIME 90 tablet 0  . FLUoxetine (PROZAC) 10 MG capsule Take 10 mg by mouth every morning. Takes with a 76m capsule for her total dose of 31m    . Marland KitchenLUoxetine (PROZAC) 20 MG capsule Take 20 mg by mouth every morning. Takes with a 1069mapsule for her total dose of 69m26m  . lidocaine-prilocaine (EMLA) cream Apply 1 application topically as needed. 30 g 1  . lisinopril (ZESTRIL) 20 MG tablet Take 1 tablet (20 mg total) by mouth daily. 30 tablet 11  . losartan-hydrochlorothiazide (HYZAAR) 100-12.5 MG tablet Take 1 tablet by mouth daily.     . MEMarland KitchenATONIN PO Take 10 mg by mouth at bedtime.     . metFORMIN (GLUCOPHAGE-XR) 500 MG 24 hr tablet Take 500 mg by mouth daily with breakfast.   3  . metoprolol tartrate (LOPRESSOR) 25 MG tablet Take 1 tablet (25 mg total) by mouth 2 (two) times daily. 60 tablet 3  . Multiple Vitamin (MULTIVITAMIN WITH MINERALS) TABS Take 1 tablet by mouth every morning.     . ondansetron (ZOFRAN) 8 MG tablet Take 1 tablet (8 mg total) by mouth every 8 (eight) hours as needed for refractory nausea / vomiting. Start on day 3 after carboplatin chemo. 30 tablet 1  . prochlorperazine (COMPAZINE) 10 MG tablet Take 1 tablet (10 mg total) by mouth every 6 (six) hours as needed (Nausea or vomiting). 30 tablet 1  . rosuvastatin (CRESTOR) 20 MG tablet Take 20 mg by mouth every morning.      No current facility-administered medications for this visit.   Facility-Administered Medications Ordered in Other Visits  Medication Dose Route Frequency Provider Last Rate Last Admin  . sodium  chloride flush (NS) 0.9 % injection 10 mL  10 mL Intravenous PRN GehrNancy Marus   10 mL at 05/12/17 0823    PHYSICAL EXAMINATION: ECOG PERFORMANCE STATUS: 1 - Symptomatic but completely ambulatory  Vitals:   04/18/20 0953  BP: 122/88  Pulse: 63  Resp: 18  Temp: (!) 97.4 F (36.3 C)  SpO2: 100%   Filed Weights   04/18/20 0953  Weight: 191 lb (86.6 kg)    GENERAL:alert, no distress and comfortable SKIN: skin color, texture, turgor are normal, no rashes or significant lesions EYES: normal, Conjunctiva are pink and non-injected, sclera clear OROPHARYNX:no exudate, no erythema and lips, buccal mucosa, and tongue normal  NECK: supple, thyroid normal size, non-tender, without nodularity LYMPH:  no palpable lymphadenopathy in the cervical, axillary or inguinal LUNGS: clear to auscultation and percussion with  normal breathing effort HEART: regular rate & rhythm and no murmurs and no lower extremity edema ABDOMEN:abdomen soft, non-tender and normal bowel sounds Musculoskeletal:no cyanosis of digits and no clubbing  NEURO: alert & oriented x 3 with fluent speech, no focal motor/sensory deficits  LABORATORY DATA:  I have reviewed the data as listed    Component Value Date/Time   NA 138 04/18/2020 0933   NA 141 12/02/2016 1219   K 3.9 04/18/2020 0933   K 3.7 12/02/2016 1219   CL 101 04/18/2020 0933   CL 103 01/18/2013 1329   CO2 30 04/18/2020 0933   CO2 27 12/02/2016 1219   GLUCOSE 104 (H) 04/18/2020 0933   GLUCOSE 98 12/02/2016 1219   GLUCOSE 110 (H) 01/18/2013 1329   BUN 14 04/18/2020 0933   BUN 15.3 12/02/2016 1219   CREATININE 0.74 04/18/2020 0933   CREATININE 0.7 12/02/2016 1219   CALCIUM 9.3 04/18/2020 0933   CALCIUM 10.1 12/02/2016 1219   PROT 7.1 04/18/2020 0933   PROT 7.1 12/02/2016 1219   ALBUMIN 4.1 04/18/2020 0933   ALBUMIN 4.2 12/02/2016 1219   AST 25 04/18/2020 0933   AST 21 12/02/2016 1219   ALT 29 04/18/2020 0933   ALT 27 12/02/2016 1219   ALKPHOS 79  04/18/2020 0933   ALKPHOS 104 12/02/2016 1219   BILITOT 0.4 04/18/2020 0933   BILITOT 0.37 12/02/2016 1219   GFRNONAA >60 04/18/2020 0933   GFRAA >60 04/18/2020 0933    No results found for: SPEP, UPEP  Lab Results  Component Value Date   WBC 4.9 04/18/2020   NEUTROABS 2.6 04/18/2020   HGB 13.4 04/18/2020   HCT 40.0 04/18/2020   MCV 91.1 04/18/2020   PLT 130 (L) 04/18/2020      Chemistry      Component Value Date/Time   NA 138 04/18/2020 0933   NA 141 12/02/2016 1219   K 3.9 04/18/2020 0933   K 3.7 12/02/2016 1219   CL 101 04/18/2020 0933   CL 103 01/18/2013 1329   CO2 30 04/18/2020 0933   CO2 27 12/02/2016 1219   BUN 14 04/18/2020 0933   BUN 15.3 12/02/2016 1219   CREATININE 0.74 04/18/2020 0933   CREATININE 0.7 12/02/2016 1219   GLU 236 12/13/2012 1558      Component Value Date/Time   CALCIUM 9.3 04/18/2020 0933   CALCIUM 10.1 12/02/2016 1219   ALKPHOS 79 04/18/2020 0933   ALKPHOS 104 12/02/2016 1219   AST 25 04/18/2020 0933   AST 21 12/02/2016 1219   ALT 29 04/18/2020 0933   ALT 27 12/02/2016 1219   BILITOT 0.4 04/18/2020 0933   BILITOT 0.37 12/02/2016 1219

## 2020-04-18 NOTE — Progress Notes (Signed)
[place] Spiritual Care Note  Met Kristin Webb in person prior to her appointment with Dr Alvy Bimler this morning and stayed through appointment as social support to help bridge the isolation that covid precautions cause. Kristin Webb was very welcoming of pastoral presence. Provided empathic listening, pastoral reflection, spiritual/emotional support, and encouragement. We plan to follow up by phone next week to check in and identify future needs.   [place], [place], [facility] Pager [phone] Voicemail [phone]

## 2020-04-19 ENCOUNTER — Other Ambulatory Visit: Payer: Self-pay | Admitting: Neurology

## 2020-04-19 DIAGNOSIS — F5102 Adjustment insomnia: Secondary | ICD-10-CM

## 2020-04-19 DIAGNOSIS — E782 Mixed hyperlipidemia: Secondary | ICD-10-CM | POA: Diagnosis not present

## 2020-04-19 DIAGNOSIS — G4733 Obstructive sleep apnea (adult) (pediatric): Secondary | ICD-10-CM

## 2020-04-19 DIAGNOSIS — M159 Polyosteoarthritis, unspecified: Secondary | ICD-10-CM | POA: Diagnosis not present

## 2020-04-19 DIAGNOSIS — I10 Essential (primary) hypertension: Secondary | ICD-10-CM | POA: Diagnosis not present

## 2020-04-19 DIAGNOSIS — E1159 Type 2 diabetes mellitus with other circulatory complications: Secondary | ICD-10-CM | POA: Diagnosis not present

## 2020-04-24 ENCOUNTER — Ambulatory Visit: Payer: Medicare HMO

## 2020-04-24 ENCOUNTER — Other Ambulatory Visit: Payer: Self-pay

## 2020-04-24 ENCOUNTER — Encounter: Payer: Self-pay | Admitting: General Practice

## 2020-04-24 VITALS — BP 161/86 | HR 68

## 2020-04-24 DIAGNOSIS — Z483 Aftercare following surgery for neoplasm: Secondary | ICD-10-CM

## 2020-04-24 DIAGNOSIS — R2689 Other abnormalities of gait and mobility: Secondary | ICD-10-CM | POA: Diagnosis not present

## 2020-04-24 NOTE — Progress Notes (Signed)
[place] Spiritual Care Note  Attempted pastoral follow-up by phone. Left voicemail encouraging return call.   [place], [place], [facility] Pager [phone] Voicemail [phone]

## 2020-04-24 NOTE — Therapy (Signed)
[place], [place], [address] Phone: [phone]   Fax:  [phone]  Physical Therapy Treatment  Patient Details  Name: Kristin Webb MRN: [id number] Date of Birth: September 22, 1945 Referring Provider (PT): Dr Alvy Bimler   Encounter Date: 04/24/2020   PT End of Session - 04/24/20 0834    Visit Number 16    Number of Visits 18    Date for PT Re-Evaluation 04/25/20    Authorization Type Approved 12 visits from 6/23-8/4; request sent from 03/14/20 to 05/04/20    Authorization - Visit Number 37    Authorization - Number of Visits 18    PT Start Time 0803    PT Stop Time 0822    PT Time Calculation (min) 19 min    Activity Tolerance Treatment limited secondary to medical complications (Comment)    Behavior During Therapy [facility] for tasks assessed/performed           Past Medical History:  Diagnosis Date  . Burning mouth syndrome   . Chronic fatigue   . Constipation   . Diabetes ([place]) 05/09/2018  . Diarrhea    in the past after gallbladder removal  . Fibromyalgia   . GERD (gastroesophageal reflux disease)   . History of kidney stones 07/2015  . Hyperlipidemia   . Hypertension    borderline not on meds   . Insomnia secondary to depression with anxiety   . Lumbar disc disease   . Ovarian cancer ([place]) 2014/2020   met nodule in abd  . Pelvic mass in female   . Pneumonia    hx of pneumonia as an infant  . PONV (postoperative nausea and vomiting)    pain from gas hernia 2017  . Shortness of breath    with exertion   . Sleep apnea    uses CPAP  . Yeast infection     Past Surgical History:  Procedure Laterality Date  . ABDOMINAL HYSTERECTOMY     early 28s  . APPENDECTOMY     WITH DEBULKING/BSO  . CHOLECYSTECTOMY     early 77s  . CYSTOSCOPY W/ URETERAL STENT PLACEMENT Left 07/09/2015   DUE TO NEPHROLITHIASIS Procedure: CYSTOSCOPY WITH LEFT RETROGRADE PYELOGRAM/ LEFT URETERAL STENT PLACEMENT;   Surgeon: Ardis Hughs, MD;  [facility];  Service: Urology;  Laterality: Left;  . HERNIA REPAIR    . INCISIONAL HERNIA REPAIR N/A 12/25/2015   WITH MESH Procedure:  INCISIONAL HERNIA REPAIR;  Surgeon: Ralene Ok, MD;  [facility];  Service: General;  Laterality: N/A;  . INCISIONAL HERNIA REPAIR N/A 10/21/2018   Procedure: LAPAROSCOPIC INCISIONAL HERNIA REPAIR WITH MESH;  Surgeon: Ralene Ok, MD;  Location: [place];  Service: General;  Laterality: N/A;  . INSERTION OF MESH N/A 12/25/2015   Procedure: INSERTION OF MESH;  Surgeon: Ralene Ok, MD;  [facility];  Service: General;  Laterality: N/A;  . LAPAROTOMY Bilateral 11/08/2012   Procedure: EXPLORATORY LAPAROTOMY BILATERAL SALPINGO OOPHORECTOMY TUMOR DEBULKING ;  Surgeon: Imagene Gurney A. Alycia Rossetti, MD;  [facility];  Service: Gynecology;  Laterality: Bilateral;  APPENDECTOMY / OMENTECTOMY  . LAPAROTOMY N/A 12/25/2015   Procedure: EXPLORATORY LAPAROTOMY;  Surgeon: Ralene Ok, MD;  [facility];  Service: General;  Laterality: N/A;  . LYSIS OF ADHESION N/A 12/25/2015   Procedure: LYSIS OF ADHESION;  Surgeon: Ralene Ok, MD;  [facility];  Service: General;  Laterality: N/A;  . PARTIAL HYSTERECTOMY  2006  . TRIGGER FINGER RELEASE  Vitals:   04/24/20 0809  BP: (!) 161/86  Pulse: 68     Subjective Assessment - 04/24/20 0809    Subjective My BP has been doing ok with the systolic being around 253. Today I feel a little off. (first BP check was 161/86, HR 68 BPM, second after 10' 159/101 ).    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  Recurrence found in 2019 with new nodules in the left lower quadrant and colon.  Just completed 6 sessions of chemotherapy and now on maintenance infusions every 3 weeks.  Fibromyalgia, Lumbar DDD, DM    Patient Stated Goals I'd like to stop bumping in to things.    Currently in  Pain? Yes    Pain Score 5     Pain Location Back    Pain Orientation Upper    Pain Descriptors / Indicators Constant;Other (Comment)   my upper back just feels cranky   Pain Type Chronic pain    Pain Onset More than a month ago    Pain Frequency Intermittent    Aggravating Factors  not sure what makes it get worse    Pain Relieving Factors resting                             [facility] Adult PT Treatment/Exercise - 04/24/20 0001      Self-Care   Other Self-Care Comments  Pts initial BP was elevated so waited and monitored pt for about 10 more mins and took again and dystolic was elevated and pt reported feeling a little "off" today with balance, thogh no dizziness, so decided to hold treatment.                       PT Long Term Goals - 03/14/20 0815      PT LONG TERM GOAL #1   Title Pt will improve [place] ABS to 30/40    Baseline 34    Status Achieved      PT LONG TERM GOAL #2   Title Pt will demonstrate single leg stance time of 10 seconds on the left LE    Baseline 4 seconds; >12 sec with small LOB but able to self correct without putting foot down-03/04/20, on7/29/21 23 seconds    Status Achieved      PT LONG TERM GOAL #3   Title Pt will report no worsening of fatigue by the end of the day    Baseline No change with this as of yet, but pt has also been dealing with changing dosage of BP which may affect this-03/04/20; no change with end of day fatigue - 03/14/20    Time 4    Period Weeks    Status On-going      PT LONG TERM GOAL #4   Title Pt will be ind with final HEP    Baseline Pt is independent with current HEP at this time - 03/04/20    Time 4    Period Weeks    Status Partially Met      PT LONG TERM GOAL #5   Title Pt will be able to report increased standing tolerance due to decrease fatigue, of up to 30 minutes for meal prep and other ADLs.    Baseline 15-20 minutes - 03/14/20    Time 4    Period Weeks    Status New  Plan - 04/24/20 0834    Clinical Impression Statement Pt came i nreporting her upper back was bothering her a little more today and she felt slightly more off balance than usual (i.e. bumping into walls). Her inital BP was elevated from what it's been past few weeks 10 161/86, waited at least 10 mins and retook and was 159/101. Pt did not seem in distress and reported no feelings of lightheaded or dizzy. Pt and therapist agreed to hold off on treatment today, pt to go home and monitor BP and rest. She plans to update her doctor about change as well.    Personal Factors and Comorbidities Age;Fitness;Comorbidity 1    Comorbidities chemotherapy    Examination-Activity Limitations Locomotion Level    Examination-Participation Restrictions Yard Work;Community Activity    Stability/Clinical Decision Making Evolving/Moderate complexity    Rehab Potential Good    PT Frequency 1x / week    PT Duration 6 weeks    PT Treatment/Interventions ADLs/Self Care Home Management;Gait training;Therapeutic exercise;Patient/family education;Manual techniques;Neuromuscular re-education    PT Next Visit Plan Decide renewal vs. D/C next session. Did BP normalize after today? Assess BP before and during session prn; continue with high level balance activities including head turns, and endurance acitivities, like the NuStep, progressing HEP prn    PT Home Exercise Plan Straight leg raises in standing with theraband; begin daily walking routine    Consulted and Agree with Plan of Care Patient           Patient will benefit from skilled therapeutic intervention in order to improve the following deficits and impairments:  Decreased activity tolerance, Difficulty walking, Decreased balance, Postural dysfunction, Impaired flexibility, Decreased coordination, Decreased strength  Visit Diagnosis: Other abnormalities of gait and mobility  Aftercare following surgery for neoplasm     Problem List Patient  Active Problem List   Diagnosis Date Noted  . Frequent headaches 03/13/2020  . Balance problems 01/29/2020  . Sinus congestion 01/29/2020  . Bilateral cataracts 12/12/2019  . UTI (urinary tract infection) 11/22/2019  . Dysuria 11/20/2019  . 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 Thrombocytopenia ([place]) 09/08/2019  . Fibromyalgia 09/08/2019  . Goals of care, counseling/discussion 08/08/2019  . Excessive body weight gain 05/09/2018  . Neoplastic malignant related fatigue 05/09/2018  . OSA on CPAP 05/09/2018  . Newly diagnosed diabetes ([place]) 05/09/2018  . Hypercalcemia 07/21/2016  . Other fatigue 07/15/2016  . Excessive daytime sleepiness 05/18/2016  . Abdominal wall hernia 05/18/2016  . Activity intolerance related to fatigue 05/18/2016  . S/P hernia repair 12/25/2015  . BRCA negative 09/25/2015  . Pyelonephritis 07/09/2015  . Nephrolithiasis 07/09/2015  . Insomnia secondary to depression with anxiety 12/24/2014  . Gastritis 12/20/2014  . Port catheter in place 12/20/2014  . Insomnia 12/12/2013  . Obesity (BMI 35.0-39.9 without comorbidity) 12/12/2013  . OSA (obstructive sleep apnea) 11/24/2013  . Burning mouth syndrome 11/24/2013  . Ventral hernia 04/18/2013  . Essential hypertension 04/18/2013  . 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

## 2020-04-30 ENCOUNTER — Encounter: Payer: Self-pay | Admitting: Rehabilitation

## 2020-04-30 ENCOUNTER — Ambulatory Visit: Payer: Medicare HMO | Admitting: Rehabilitation

## 2020-04-30 ENCOUNTER — Other Ambulatory Visit: Payer: Self-pay

## 2020-04-30 DIAGNOSIS — R2689 Other abnormalities of gait and mobility: Secondary | ICD-10-CM

## 2020-04-30 DIAGNOSIS — Z483 Aftercare following surgery for neoplasm: Secondary | ICD-10-CM

## 2020-04-30 NOTE — Therapy (Signed)
[place], [place], [address] Phone: [phone]   Fax:  [phone]  Physical Therapy Treatment  Patient Details  Name: Kristin Webb MRN: [id number] Date of Birth: 07-21-1946 Referring Provider (PT): Dr Alvy Bimler   Encounter Date: 04/30/2020   PT End of Session - 04/30/20 1535    Visit Number 17    Number of Visits 18    Date for PT Re-Evaluation 04/25/20    Authorization - Visit Number 52    Authorization - Number of Visits 18    PT Start Time 1500    PT Stop Time 1530    PT Time Calculation (min) 30 min    Activity Tolerance Treatment limited secondary to medical complications (Comment)    Behavior During Therapy [facility] for tasks assessed/performed           Past Medical History:  Diagnosis Date  . Burning mouth syndrome   . Chronic fatigue   . Constipation   . Diabetes ([place]) 05/09/2018  . Diarrhea    in the past after gallbladder removal  . Fibromyalgia   . GERD (gastroesophageal reflux disease)   . History of kidney stones 07/2015  . Hyperlipidemia   . Hypertension    borderline not on meds   . Insomnia secondary to depression with anxiety   . Lumbar disc disease   . Ovarian cancer ([place]) 2014/2020   met nodule in abd  . Pelvic mass in female   . Pneumonia    hx of pneumonia as an infant  . PONV (postoperative nausea and vomiting)    pain from gas hernia 2017  . Shortness of breath    with exertion   . Sleep apnea    uses CPAP  . Yeast infection     Past Surgical History:  Procedure Laterality Date  . ABDOMINAL HYSTERECTOMY     early 39s  . APPENDECTOMY     WITH DEBULKING/BSO  . CHOLECYSTECTOMY     early 40s  . CYSTOSCOPY W/ URETERAL STENT PLACEMENT Left 07/09/2015   DUE TO NEPHROLITHIASIS Procedure: CYSTOSCOPY WITH LEFT RETROGRADE PYELOGRAM/ LEFT URETERAL STENT PLACEMENT;  Surgeon: Ardis Hughs, MD;  [facility];  Service: Urology;  Laterality: Left;  .  HERNIA REPAIR    . INCISIONAL HERNIA REPAIR N/A 12/25/2015   WITH MESH Procedure:  INCISIONAL HERNIA REPAIR;  Surgeon: Ralene Ok, MD;  [facility];  Service: General;  Laterality: N/A;  . INCISIONAL HERNIA REPAIR N/A 10/21/2018   Procedure: LAPAROSCOPIC INCISIONAL HERNIA REPAIR WITH MESH;  Surgeon: Ralene Ok, MD;  Location: [place];  Service: General;  Laterality: N/A;  . INSERTION OF MESH N/A 12/25/2015   Procedure: INSERTION OF MESH;  Surgeon: Ralene Ok, MD;  [facility];  Service: General;  Laterality: N/A;  . LAPAROTOMY Bilateral 11/08/2012   Procedure: EXPLORATORY LAPAROTOMY BILATERAL SALPINGO OOPHORECTOMY TUMOR DEBULKING ;  Surgeon: Imagene Gurney A. Alycia Rossetti, MD;  [facility];  Service: Gynecology;  Laterality: Bilateral;  APPENDECTOMY / OMENTECTOMY  . LAPAROTOMY N/A 12/25/2015   Procedure: EXPLORATORY LAPAROTOMY;  Surgeon: Ralene Ok, MD;  [facility];  Service: General;  Laterality: N/A;  . LYSIS OF ADHESION N/A 12/25/2015   Procedure: LYSIS OF ADHESION;  Surgeon: Ralene Ok, MD;  [facility];  Service: General;  Laterality: N/A;  . PARTIAL HYSTERECTOMY  2006  . TRIGGER FINGER RELEASE      There were no vitals filed for this visit.   Subjective Assessment - 04/30/20  1454    Subjective I feel like I made some progress.  I have also learned tools about how to hold myself and how to keep my balance.    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  Recurrence found in 2019 with new nodules in the left lower quadrant and colon.  Just completed 6 sessions of chemotherapy and now on maintenance infusions every 3 weeks.  Fibromyalgia, Lumbar DDD, DM    Currently in Pain? Yes    Pain Score 3     Pain Location Back    Pain Orientation Upper    Pain Descriptors / Indicators Aching    Pain Type Acute pain    Pain Onset More than a month ago    Pain Frequency Intermittent               [facility] PT Assessment - 04/30/20 0001      Transfers   Five time sit to stand comments  16.03      High Level Balance   High Level Balance Comments [place] ABS: 32/40                         [facility] Adult PT Treatment/Exercise - 04/30/20 0001      Exercises   Exercises Other Exercises    Other Exercises  Reviewed final HEP to consist of walking program, posture, and OTAGO                        PT Long Term Goals - 04/30/20 1502      PT LONG TERM GOAL #1   Title Pt will improve [place] ABS to 30/40    Baseline 24/40, 32/40    Status Achieved      PT LONG TERM GOAL #2   Title Pt will demonstrate single leg stance time of 10 seconds on the left LE    Baseline 4 seconds; >12 sec with small LOB but able to self correct without putting foot down-03/04/20, on7/29/21 23 seconds    Status Achieved      PT LONG TERM GOAL #3   Title Pt will report no worsening of fatigue by the end of the day    Baseline No change with this as of yet, but pt has also been dealing with changing dosage of BP which may affect this-03/04/20; no change with end of day fatigue - 03/14/20    Status Not Met      PT LONG TERM GOAL #4   Title Pt will be ind with final HEP    Status Achieved      PT LONG TERM GOAL #5   Title Pt will be able to report increased standing tolerance due to decrease fatigue, of up to 30 minutes for meal prep and other ADLs.    Baseline 15-20 minutes - 03/14/20 15-20 minutes of anything even if it is sitting and knitting    Status Partially Met                 Plan - 04/30/20 1535    Clinical Impression Statement Pt has met all of her goals except for decreasing fatigue at the end of the day.  Pt feels ready for D/C with HEP and continueing to work on walking endurance.  Reviewed goals, assessed goals with pt improving [place] Advanced Balance score from 24/40 to 32/40.  PT Frequency 1x / week    PT Duration 6 weeks    PT  Treatment/Interventions ADLs/Self Care Home Management;Gait training;Therapeutic exercise;Patient/family education;Manual techniques;Neuromuscular re-education    PT Home Exercise Plan begin daily walking routine, OTAGO    Consulted and Agree with Plan of Care Patient           Patient will benefit from skilled therapeutic intervention in order to improve the following deficits and impairments:     Visit Diagnosis: Aftercare following surgery for neoplasm  Other abnormalities of gait and mobility     Problem List Patient Active Problem List   Diagnosis Date Noted  . Frequent headaches 03/13/2020  . Balance problems 01/29/2020  . Sinus congestion 01/29/2020  . Bilateral cataracts 12/12/2019  . UTI (urinary tract infection) 11/22/2019  . Dysuria 11/20/2019  . 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 Thrombocytopenia ([place]) 09/08/2019  . Fibromyalgia 09/08/2019  . Goals of care, counseling/discussion 08/08/2019  . Excessive body weight gain 05/09/2018  . Neoplastic malignant related fatigue 05/09/2018  . OSA on CPAP 05/09/2018  . Newly diagnosed diabetes ([place]) 05/09/2018  . Hypercalcemia 07/21/2016  . Other fatigue 07/15/2016  . Excessive daytime sleepiness 05/18/2016  . Abdominal wall hernia 05/18/2016  . Activity intolerance related to fatigue 05/18/2016  . S/P hernia repair 12/25/2015  . BRCA negative 09/25/2015  . Pyelonephritis 07/09/2015  . Nephrolithiasis 07/09/2015  . Insomnia secondary to depression with anxiety 12/24/2014  . Gastritis 12/20/2014  . Port catheter in place 12/20/2014  . Insomnia 12/12/2013  . Obesity (BMI 35.0-39.9 without comorbidity) 12/12/2013  . OSA (obstructive sleep apnea) 11/24/2013  . Burning mouth syndrome 11/24/2013  . Ventral hernia 04/18/2013  . Essential hypertension 04/18/2013  . 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  Patient goals were partially met. Patient is being discharged due to being pleased with the current functional level.  ?????    Shan Levans, PT

## 2020-05-06 ENCOUNTER — Ambulatory Visit (HOSPITAL_COMMUNITY)
Admission: RE | Admit: 2020-05-06 | Discharge: 2020-05-06 | Disposition: A | Discharge status: Home / Self Care | Payer: Medicare HMO | Source: Ambulatory Visit | Attending: Hematology and Oncology | Admitting: Hematology and Oncology

## 2020-05-06 ENCOUNTER — Other Ambulatory Visit: Payer: Self-pay

## 2020-05-06 ENCOUNTER — Encounter (HOSPITAL_COMMUNITY): Payer: Self-pay

## 2020-05-06 DIAGNOSIS — R590 Localized enlarged lymph nodes: Secondary | ICD-10-CM | POA: Insufficient documentation

## 2020-05-06 DIAGNOSIS — Z90722 Acquired absence of ovaries, bilateral: Secondary | ICD-10-CM | POA: Diagnosis not present

## 2020-05-06 DIAGNOSIS — C5700 Malignant neoplasm of unspecified fallopian tube: Secondary | ICD-10-CM | POA: Insufficient documentation

## 2020-05-06 DIAGNOSIS — C569 Malignant neoplasm of unspecified ovary: Secondary | ICD-10-CM | POA: Diagnosis not present

## 2020-05-06 DIAGNOSIS — Z90710 Acquired absence of both cervix and uterus: Secondary | ICD-10-CM | POA: Insufficient documentation

## 2020-05-06 MED ORDER — HEPARIN SOD (PORK) LOCK FLUSH 100 UNIT/ML IV SOLN
500.0000 [IU] | Freq: Once | INTRAVENOUS | Status: AC
  Administered 2020-05-06: 500 [IU] via INTRAVENOUS

## 2020-05-06 MED ORDER — IOHEXOL 300 MG/ML  SOLN
100.0000 mL | Freq: Once | INTRAMUSCULAR | Status: AC | PRN
  Administered 2020-05-06: 100 mL via INTRAVENOUS

## 2020-05-06 MED ORDER — IOHEXOL 9 MG/ML PO SOLN
ORAL | Status: AC
  Administered 2020-05-06: 1000 mL
  Filled 2020-05-06: qty 1000

## 2020-05-06 MED ORDER — HEPARIN SOD (PORK) LOCK FLUSH 100 UNIT/ML IV SOLN
INTRAVENOUS | Status: AC
  Filled 2020-05-06: qty 5

## 2020-05-06 MED ORDER — IOHEXOL 9 MG/ML PO SOLN: 1000.0000 mL | ORAL | Status: AC

## 2020-05-07 ENCOUNTER — Encounter: Payer: Self-pay | Admitting: Hematology and Oncology

## 2020-05-07 ENCOUNTER — Encounter: Payer: Self-pay | Admitting: General Practice

## 2020-05-07 ENCOUNTER — Other Ambulatory Visit: Payer: Self-pay

## 2020-05-07 ENCOUNTER — Telehealth: Payer: Self-pay | Admitting: Hematology and Oncology

## 2020-05-07 ENCOUNTER — Inpatient Hospital Stay: Payer: Medicare HMO

## 2020-05-07 ENCOUNTER — Inpatient Hospital Stay (HOSPITAL_BASED_OUTPATIENT_CLINIC_OR_DEPARTMENT_OTHER): Payer: Medicare HMO | Admitting: Hematology and Oncology

## 2020-05-07 DIAGNOSIS — I10 Essential (primary) hypertension: Secondary | ICD-10-CM

## 2020-05-07 DIAGNOSIS — Z9049 Acquired absence of other specified parts of digestive tract: Secondary | ICD-10-CM | POA: Diagnosis not present

## 2020-05-07 DIAGNOSIS — C5701 Malignant neoplasm of right fallopian tube: Secondary | ICD-10-CM

## 2020-05-07 DIAGNOSIS — Z90710 Acquired absence of both cervix and uterus: Secondary | ICD-10-CM | POA: Diagnosis not present

## 2020-05-07 DIAGNOSIS — D696 Thrombocytopenia, unspecified: Secondary | ICD-10-CM

## 2020-05-07 DIAGNOSIS — C5700 Malignant neoplasm of unspecified fallopian tube: Secondary | ICD-10-CM | POA: Diagnosis not present

## 2020-05-07 DIAGNOSIS — Z5112 Encounter for antineoplastic immunotherapy: Secondary | ICD-10-CM | POA: Diagnosis not present

## 2020-05-07 DIAGNOSIS — Z7189 Other specified counseling: Secondary | ICD-10-CM

## 2020-05-07 DIAGNOSIS — Z7984 Long term (current) use of oral hypoglycemic drugs: Secondary | ICD-10-CM | POA: Diagnosis not present

## 2020-05-07 DIAGNOSIS — Z79899 Other long term (current) drug therapy: Secondary | ICD-10-CM | POA: Diagnosis not present

## 2020-05-07 DIAGNOSIS — C9200 Acute myeloblastic leukemia, not having achieved remission: Secondary | ICD-10-CM | POA: Diagnosis not present

## 2020-05-07 LAB — CBC WITH DIFFERENTIAL (CANCER CENTER ONLY)
Abs Immature Granulocytes: 0.02 10*3/uL (ref 0.00–0.07)
Basophils Absolute: 0 10*3/uL (ref 0.0–0.1)
Basophils Relative: 1 %
Eosinophils Absolute: 0.3 10*3/uL (ref 0.0–0.5)
Eosinophils Relative: 6 %
HCT: 39.4 % (ref 36.0–46.0)
Hemoglobin: 12.9 g/dL (ref 12.0–15.0)
Immature Granulocytes: 0 %
Lymphocytes Relative: 31 %
Lymphs Abs: 1.5 10*3/uL (ref 0.7–4.0)
MCH: 29.8 pg (ref 26.0–34.0)
MCHC: 32.7 g/dL (ref 30.0–36.0)
MCV: 91 fL (ref 80.0–100.0)
Monocytes Absolute: 0.4 10*3/uL (ref 0.1–1.0)
Monocytes Relative: 9 %
Neutro Abs: 2.5 10*3/uL (ref 1.7–7.7)
Neutrophils Relative %: 53 %
Platelet Count: 127 10*3/uL — ABNORMAL LOW (ref 150–400)
RBC: 4.33 MIL/uL (ref 3.87–5.11)
RDW: 13.2 % (ref 11.5–15.5)
WBC Count: 4.7 10*3/uL (ref 4.0–10.5)
nRBC: 0 % (ref 0.0–0.2)

## 2020-05-07 LAB — CMP (CANCER CENTER ONLY)
ALT: 33 U/L (ref 0–44)
AST: 24 U/L (ref 15–41)
Albumin: 3.8 g/dL (ref 3.5–5.0)
Alkaline Phosphatase: 94 U/L (ref 38–126)
Anion gap: 6 (ref 5–15)
BUN: 15 mg/dL (ref 8–23)
CO2: 31 mmol/L (ref 22–32)
Calcium: 9.3 mg/dL (ref 8.9–10.3)
Chloride: 101 mmol/L (ref 98–111)
Creatinine: 0.73 mg/dL (ref 0.44–1.00)
GFR, Est AFR Am: >60 mL/min (ref >60)
GFR, Estimated: >60 mL/min (ref >60)
Glucose, Bld: 110 mg/dL — ABNORMAL HIGH (ref 70–99)
Potassium: 4.1 mmol/L (ref 3.5–5.1)
Sodium: 138 mmol/L (ref 135–145)
Total Bilirubin: 0.2 mg/dL — ABNORMAL LOW (ref 0.3–1.2)
Total Protein: 6.9 g/dL (ref 6.5–8.1)

## 2020-05-07 LAB — TOTAL PROTEIN, URINE DIPSTICK: Protein, ur: NEGATIVE mg/dL

## 2020-05-07 MED ORDER — SODIUM CHLORIDE 0.9 % IV SOLN
15.0000 mg/kg | Freq: Once | INTRAVENOUS | Status: AC
  Administered 2020-05-07: 1300 mg via INTRAVENOUS
  Filled 2020-05-07: qty 48

## 2020-05-07 MED ORDER — SODIUM CHLORIDE 0.9 % IV SOLN
Freq: Once | INTRAVENOUS | Status: AC
  Filled 2020-05-07: qty 250

## 2020-05-07 MED ORDER — SODIUM CHLORIDE 0.9% FLUSH
10.0000 mL | Freq: Once | INTRAVENOUS | Status: AC
  Administered 2020-05-07: 10 mL
  Filled 2020-05-07: qty 10

## 2020-05-07 MED ORDER — SODIUM CHLORIDE 0.9% FLUSH
10.0000 mL | INTRAVENOUS | Status: DC | PRN
  Administered 2020-05-07: 10 mL
  Filled 2020-05-07: qty 10

## 2020-05-07 MED ORDER — HEPARIN SOD (PORK) LOCK FLUSH 100 UNIT/ML IV SOLN
500.0000 [IU] | Freq: Once | INTRAVENOUS | Status: AC | PRN
  Administered 2020-05-07: 500 [IU]
  Filled 2020-05-07: qty 5

## 2020-05-07 NOTE — Progress Notes (Signed)
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   All questions were answered. The patient knows to call the clinic with any problems, questions or concerns. The total time spent in the appointment was 20 minutes encounter with patients including review of chart and various tests results, discussions about plan of care and coordination of care plan   [name], MD 05/07/2020 3:16 PM  INTERVAL HISTORY: Please see below for problem oriented charting. She returns for treatment and follow-up She tolerated recent treatment well She still have some dizziness but she has completed physical therapy Her blood pressure control at home has improved  SUMMARY OF ONCOLOGIC HISTORY: Oncology History Overview Note  Oncologic Summary: 1. 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 Random CA 125 elevation January 2019  Question mesenteric nodules and anterior abdominal wall nodule 3. GeneDx Breast/Ovary Panel negative (including BRCA,  MMR's, RAD51 etc) ? 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  Interval development of large mid abdominal mass highly concerning for right ovarian cancer.  There is mild omental nodularity on the left, and peritoneal disease cannot be completely excluded.  There is no ascites or other evidence of metastatic disease. 2.  Mild associated renal pelvocaliectasis bilaterally without obstruction.  Nonobstructing left renal calculus and a small right renal angiomyolipoma noted incidentally.   11/08/2012 Pathology Results   1. 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 - BENIGN FALLOPIAN TUBE WITH NONSPECIFIC CHRONIC INFLAMMATION. 2. Ovary and fallopian tube, left - BENIGN OVARY; NEGATIVE FOR ATYPIA OR MALIGNANCY. - BENIGN FALLOPIAN TUBE; NEGATIVE FOR ATYPIA OR MALIGNANCY. 3. Omentum, resection for tumor - HIGH GRADE CARCINOMA, SEE COMMENT. 4. Appendix, Other than Incidental - FIBROUS OBLITERATION OF APPENDICEAL TIP. - NEGATIVE FOR MALIGNANCY.   11/08/2012 Surgery   Surgery: Exploratory laparotomy, bilateral salpingo-oophorectomy, appendectomy, infacolic omentectomy, optimal debulking  Surgeons:  Paola A. Alycia Rossetti, MD; Lahoma Crocker, MD   Assistant: Caswell Corwin  Pathology: Bilateral fallopian tubes and ovaries to pathology. Appendix as well as omentum. 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  Operative findings: 25 cm right adnexal mass with smooth surface. Surgically absent uterus. Atrophic-appearing left ovary. Normal appearing appendix. Within the omentum there were centimeter nodules scattered throughout the omentum. The remainder of the surfaces were benign.   12/08/2012  Procedure   Impression:  Placement of  a subcutaneous port device.  The catheter tip is in the lower SVC and ready to be used.    12/13/2012 - 03/28/2013 Chemotherapy   s/p 6 cycles of paclitaxel and carboplatin   12/13/2012 - 03/28/2013 Chemotherapy   The patient had 6 cycles of carboplatin and Taxol   03/24/2013 Imaging   US abdomen   04/24/2013 Imaging   CT abdomen Interval resection of the large right pelvic and lower abdominal mass lesion with apparent omentectomy.  No evidence for intraperitoneal free fluid on today's study.  No discernible peritoneal lesions.  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   09/07/2013 Genetic Testing   Patient has genetic testing done for BRCA1/2 panel Results revealed patient has no mutation(s):   07/09/2015 Imaging   CT abdomen 1. 12 mm obstructive calculus at the left ureteropelvic junction with moderate proximal hydronephrosis. 2. 2 small supraumbilical ventral hernias, one containing a short segment of the mid transverse colon and the other containing a short segment of the mid small bowel. There is no associated evidence to suggest bowel incarceration or obstruction at this time. 3. Tiny locule of gas non dependently in the lumen of the urinary bladder. This is presumably iatrogenic related to recent catheterization for urinalysis. Alternatively, this could be seen in the setting of urinary tract infection with gas-forming organisms. Clinical correlation for history of recent catheterization is recommended. 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 5. Status post cholecystectomy. 6. Additional incidental findings, as above.    12/11/2016 Imaging   Ct abdomen 1. No evidence of metastatic ovarian cancer. 2. Recurrent subxiphoid ventral abdominal wall hernia containing transverse colon. No evidence of incarceration or obstruction. 3. Stable incidental findings in the liver and kidneys. No recurrent urinary tract  calculus. 4. Progressive lower lumbar spondylosis. 5.  Aortic Atherosclerosis (ICD10-I70.0).    08/30/2017 Imaging   MRI thoracic spine 1. At T5-6 there is a small central disc protrusion contacting the ventral thoracic spinal cord. No central canal or foraminal stenosis. 2. At T9-10 there is a small right paracentral disc protrusion. 3.  No acute osseous injury of the thoracic spine. 4. No aggressive osseous lesion to suggest metastatic disease.   09/24/2017 Tumor Marker   Patient's tumor was tested for the following markers: CA-125 Results of the tumor marker test revealed 21.7   09/30/2017 Imaging   CT abdomen 1. New small clustered soft tissue nodules in the left lower quadrant in the sigmoid mesentery, largest 1.0 cm, which could represent recurrent peritoneal tumor implants. No ascites.  2. Midline high ventral abdominal wall hernia containing a portion of the transverse colon is mildly increased in size, and without bowel complication at this time. 3. Chronic findings include: Aortic Atherosclerosis (ICD10-I70.0). Diffuse hepatic steatosis. 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   10/11/2017 PET scan   1. Nodules in the sigmoid mesentery are hypermetabolic and highly worrisome for metastatic disease. 2. Attic steatosis.    11/03/2017 Procedure   Successful CT-guided rectus abdominal muscle mass core biopsy. Path: - FOREIGN BODY GIANT CELL REACTION INVOLVING FIBROADIPOSE TISSUE AND SKELETAL MUSCLE. - NO EVIDENCE OF MALIGNANCY.   11/04/2017 Cancer Staging   Staging form: Fallopian Tube, AJCC 7th Edition - Clinical: FIGO Stage IIIC, calculated as Stage IV (rT3, N0, M1) - Signed by [name], MD on 08/09/2019   02/2018 Imaging   CT: 1. Continued increase in size small peritoneal nodules along the mesenteric border of the proximal  sigmoid colon as well as along the serosal surface of the proximal sigmoid colon. Findings  consistent with local peritoneal recurrence of uterine carcinoma. 2. No evidence of distant disease. 3. Stable large ventral hernia.   05/2018 Imaging   PET: 1. Redemonstration of hypermetabolic nodules within the sigmoid mesocolon. Mild response to therapy relative to CT of 02/24/2018. Mixed response to therapy compared to the most recent PET of 10/11/2017. 2. Hypermetabolism within the right pelvic rectus musculature, increased since the prior PET.  Clinical service requested comparison to the 11/24/2017 CT. Index 10 mm nodule within the sigmoid mesocolon was similar to the 02/24/2018 CT, and as described on that exam, increased from 7 mm on 11/24/2017. More inferior nodule within the mesocolon measures 12 mm today on image 156/4 and 8 mm on 11/24/2017.   05/2018 Imaging   CT: IMPRESSION: 1. Since 02/24/2018, decreased size of peritoneal nodules centered in the sigmoid mesocolon. 2. No evidence of new or progressive disease. 3. Hepatic steatosis. 4. Subcentimeter right renal angiomyolipoma, similar. 5.  Aortic Atherosclerosis (ICD10-I70.0). 6. Ventral abdominal wall laxity containing transverse colon, similar.   08/2018 Imaging   CT: IMPRESSION: 1. Nodules within the sigmoid mesocolon have decreased in size compared to prior. No new peritoneal or omental nodularity. 2. No evidence local recurrence the pelvis. 3. Ventral hernia contains a segment of transverse colon. No change from prior.     06/19/2019 Imaging   1. No evidence of metastatic disease in the abdomen pelvis. 2. No peritoneal or omental metastasis identified.  No free fluid. 3. Postcholecystectomy and hysterectomy. Comparison exams are made available. Comparison CT 09/08/2018 and 05/27/2018. PET-CT 05/27/2018    There is a nodule within the proximal aspect of the sigmoid colon measuring 2.2 by 2.2 cm. In comparison to prior CTs and PET-CT there was a hypermetabolic nodule at this location on the PET-CT of 08/27/2017  and on the CT of 09/08/2018 there was a small residual nodule. At that time (09/08/2018) the nodule measured 1.4 by 1.3 cm. Therefore this nodule has increased in the interval and concerning for recurrence of a serosal implant. The previous described lymph nodes in the sigmoid mesocolon and more central mesentery are not increased in size and not pathologic by size criteria.    Concern for recurrence of serosal metastasis in the proximal sigmoid colon with a 2 cm enlarging lesion. Lesion extends into the lumen. No evidence of high-grade obstruction. Consider FDG PET scan and/or potential colonoscopy for evaluation.   06/29/2019 PET scan   1. Enlarging serosal implant within the proximal sigmoid colon has intense metabolic activity most consistent with malignancy. Lesion exhibits a somewhat indolent progression as present on PET-CT scan from 10/11/2017 and 05/27/2018. 2. Focal hypermetabolic activity within the RIGHT rectus muscle just off midline is slightly decreased from comparison exam. This may be benign inflammation related to prior laparotomy, however malignancy not excluded.   07/26/2019 Procedure   She had colonoscopy which showed multiple polyps.  6 polyps were removed from the cecum, measuring 3 to 6 mm in size.  One 12 mm polyp was removed from ascending colon and another 1 measures 7 mm.  One 5 mm polyp was removed from the transverse colon.  There is tumor noted in the sigmoid colon, approximately 35 cm from the anus which was biopsied.  The tumor appeared to be fungating, infiltrative and ulcerated but nonobstructive.  It encompassed approximately one third of the circumference of the lumen.   07/26/2019 Pathology Results   Multiple  polyps came back tubular adenomas.  2 ascending polyps came back sessile serrated adenoma months.  Sigmoid colon biopsy confirmed metastatic high-grade serous carcinoma.  The morphology and immunophenotype are consistent with metastatic high-grade serous  carcinoma from tubal/ovarian primary site.   08/17/2019 - 12/12/2019 Chemotherapy   The patient had carboplatin and taxol   10/23/2019 Imaging   1. Sigmoid lesion with diminished size/conspicuity difficult to measure on the previous exam. Adjacent lymph nodes and nodularity less than a cm, largest on coronal image 48 measuring 5 mm. 2. Right rectus muscle with some thickening with mildly convex margin seen posteriorly on image 72 of series 2. This could be due to postsurgical change, however, metastatic disease is not excluded. Attention on follow-up is suggested. 3. No evidence for new metastatic disease. 4. Small hiatal hernia. 5. 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   Aortic Atherosclerosis (ICD10-I70.0).   01/01/2020 Imaging   1. No signs of new disease. 2. Tiny lymph nodes in the area of concern in the LEFT lower quadrant z. 3. Added density and subtle contour irregularity involving the rectus muscles best seen on sagittal images, associated with site of prior surgical incision just to the RIGHT of midline (image 72, series 2 and image 58, series 5. Not significantly changed compared to prior studies. This measures approximately 2.1 x 1 cm in the sagittal plane and is less well-defined in the axial plane. Potentially postoperative change. Attention on follow-up. 4. Aortic atherosclerosis.   Aortic Atherosclerosis (ICD10-I70.0).       01/29/2020 -  Chemotherapy   The patient had bevacizumab-bvzr (ZIRABEV) 1,300 mg in sodium chloride 0.9 % 100 mL chemo infusion, 15 mg/kg = 1,300 mg, Intravenous,  Once, 5 of 7 cycles Administration: 1,300 mg (01/29/2020), 1,300 mg (03/27/2020), 1,300 mg (04/18/2020), 1,300 mg (02/19/2020), 1,300 mg (05/07/2020)  for chemotherapy treatment.    05/06/2020 Imaging   1. Status post hysterectomy and oophorectomy. 2. No specific findings of recurrent or metastatic disease in the abdomen or pelvis. 3. No change in postoperative appearance of low midline  incision. 4. Unchanged small prominent subcentimeter left iliac lymph nodes.     REVIEW OF SYSTEMS:   Constitutional: Denies fevers, chills or abnormal weight loss Eyes: Denies blurriness of vision Ears, nose, mouth, throat, and face: Denies mucositis or sore throat Respiratory: Denies cough, dyspnea or wheezes Cardiovascular: Denies palpitation, chest discomfort or lower extremity swelling Gastrointestinal:  Denies nausea, heartburn or change in bowel habits Skin: Denies abnormal skin rashes Lymphatics: Denies new lymphadenopathy or easy bruising Neurological:Denies numbness, tingling or new weaknesses Behavioral/Psych: Mood is stable, no new changes  All other systems were reviewed with the patient and are negative.  I have reviewed the past medical history, past surgical history, social history and family history with the patient and they are unchanged from previous note.  ALLERGIES:  is allergic to [employer] tartrate], bee venom, and cortisone.  MEDICATIONS:  Current Outpatient Medications  Medication Sig Dispense Refill  . amLODipine (NORVASC) 10 MG tablet Take 1 tablet (10 mg total) by mouth daily. 30 tablet 11  . Armodafinil 250 MG tablet Take 1 tablet (250 mg total) by mouth daily. 90 tablet 1  . busPIRone (BUSPAR) 7.5 MG tablet     . Carboxymethylcellulose Sodium (EYE DROPS OP) Apply 2 drops to eye daily as needed (dry eye).    . cholecalciferol (VITAMIN D3) 25 MCG (1000 UT) tablet Take 1,000 Units by mouth daily.    . colestipol (COLESTID) 1 g tablet  Take 0.5-1 g by mouth daily. Pt taking 1/2 to 1 tablet daily depending on meal choice; taken 1 hour after morning meds , and can not eat for another hour    . EPINEPHrine (EPIPEN 2-PAK) 0.3 mg/0.3 mL DEVI Inject 0.3 mg into the muscle once as needed (For bee stings.). Reported on 01/20/2016    . eszopiclone (LUNESTA) 2 MG TABS tablet TAKE ONE TABLET BY MOUTH EVERYDAY AT BEDTIME 90 tablet 0  . FLUoxetine (PROZAC) 10 MG  capsule Take 10 mg by mouth every morning. Takes with a 70m capsule for her total dose of 348m    . Marland KitchenLUoxetine (PROZAC) 20 MG capsule Take 20 mg by mouth every morning. Takes with a 1081mapsule for her total dose of 48m71m  . lidocaine-prilocaine (EMLA) cream Apply 1 application topically as needed. 30 g 1  . lisinopril (ZESTRIL) 20 MG tablet Take 1 tablet (20 mg total) by mouth daily. 30 tablet 11  . losartan-hydrochlorothiazide (HYZAAR) 100-12.5 MG tablet Take 1 tablet by mouth daily.     . MEMarland KitchenATONIN PO Take 10 mg by mouth at bedtime.     . metFORMIN (GLUCOPHAGE-XR) 500 MG 24 hr tablet Take 500 mg by mouth daily with breakfast.   3  . metoprolol tartrate (LOPRESSOR) 25 MG tablet Take 1 tablet (25 mg total) by mouth 2 (two) times daily. 60 tablet 3  . Multiple Vitamin (MULTIVITAMIN WITH MINERALS) TABS Take 1 tablet by mouth every morning.     . ondansetron (ZOFRAN) 8 MG tablet Take 1 tablet (8 mg total) by mouth every 8 (eight) hours as needed for refractory nausea / vomiting. Start on day 3 after carboplatin chemo. 30 tablet 1  . prochlorperazine (COMPAZINE) 10 MG tablet Take 1 tablet (10 mg total) by mouth every 6 (six) hours as needed (Nausea or vomiting). 30 tablet 1  . rosuvastatin (CRESTOR) 20 MG tablet Take 20 mg by mouth every morning.      No current facility-administered medications for this visit.   Facility-Administered Medications Ordered in Other Visits  Medication Dose Route Frequency Provider Last Rate Last Admin  . sodium chloride flush (NS) 0.9 % injection 10 mL  10 mL Intravenous PRN GehrNancy Marus   10 mL at 05/12/17 0823  . sodium chloride flush (NS) 0.9 % injection 10 mL  10 mL Intracatheter PRN GorsAlvy Bimler, MD   10 mL at 05/07/20 1041    PHYSICAL EXAMINATION: ECOG PERFORMANCE STATUS: 1 - Symptomatic but completely ambulatory  Vitals:   05/07/20 0852  BP: (!) 142/78  Pulse: 70  Resp: 18  Temp: 97.8 F (36.6 C)  SpO2: 100%   Filed Weights   05/07/20 0852   Weight: 195 lb (88.5 kg)    GENERAL:alert, no distress and comfortable NEURO: alert & oriented x 3 with fluent speech, no focal motor/sensory deficits  LABORATORY DATA:  I have reviewed the data as listed    Component Value Date/Time   NA 138 05/07/2020 0833   NA 141 12/02/2016 1219   K 4.1 05/07/2020 0833   K 3.7 12/02/2016 1219   CL 101 05/07/2020 0833   CL 103 01/18/2013 1329   CO2 31 05/07/2020 0833   CO2 27 12/02/2016 1219   GLUCOSE 110 (H) 05/07/2020 0833   GLUCOSE 98 12/02/2016 1219   GLUCOSE 110 (H) 01/18/2013 1329   BUN 15 05/07/2020 0833   BUN 15.3 12/02/2016 1219   CREATININE 0.73 05/07/2020 0833   CREATININE 0.7 12/02/2016 1219  CALCIUM 9.3 05/07/2020 0833   CALCIUM 10.1 12/02/2016 1219   PROT 6.9 05/07/2020 0833   PROT 7.1 12/02/2016 1219   ALBUMIN 3.8 05/07/2020 0833   ALBUMIN 4.2 12/02/2016 1219   AST 24 05/07/2020 0833   AST 21 12/02/2016 1219   ALT 33 05/07/2020 0833   ALT 27 12/02/2016 1219   ALKPHOS 94 05/07/2020 0833   ALKPHOS 104 12/02/2016 1219   BILITOT 0.2 (L) 05/07/2020 0833   BILITOT 0.37 12/02/2016 1219   GFRNONAA >60 05/07/2020 0833   GFRAA >60 05/07/2020 0833    No results found for: SPEP, UPEP  Lab Results  Component Value Date   WBC 4.7 05/07/2020   NEUTROABS 2.5 05/07/2020   HGB 12.9 05/07/2020   HCT 39.4 05/07/2020   MCV 91.0 05/07/2020   PLT 127 (L) 05/07/2020      Chemistry      Component Value Date/Time   NA 138 05/07/2020 0833   NA 141 12/02/2016 1219   K 4.1 05/07/2020 0833   K 3.7 12/02/2016 1219   CL 101 05/07/2020 0833   CL 103 01/18/2013 1329   CO2 31 05/07/2020 0833   CO2 27 12/02/2016 1219   BUN 15 05/07/2020 0833   BUN 15.3 12/02/2016 1219   CREATININE 0.73 05/07/2020 0833   CREATININE 0.7 12/02/2016 1219   GLU 236 12/13/2012 1558      Component Value Date/Time   CALCIUM 9.3 05/07/2020 0833   CALCIUM 10.1 12/02/2016 1219   ALKPHOS 94 05/07/2020 0833   ALKPHOS 104 12/02/2016 1219   AST 24  05/07/2020 0833   AST 21 12/02/2016 1219   ALT 33 05/07/2020 0833   ALT 27 12/02/2016 1219   BILITOT 0.2 (L) 05/07/2020 0833   BILITOT 0.37 12/02/2016 1219       RADIOGRAPHIC STUDIES: I have reviewed multiple imaging studies with the patient I have personally reviewed the radiological images as listed and agreed with the findings in the report. CT ABDOMEN PELVIS W CONTRAST  Result Date: 05/06/2020 CLINICAL DATA:  Ovarian cancer, assess treatment response EXAM: CT ABDOMEN AND PELVIS WITH CONTRAST TECHNIQUE: Multidetector CT imaging of the abdomen and pelvis was performed using the standard protocol following bolus administration of intravenous contrast. CONTRAST:  167m OMNIPAQUE IOHEXOL 300 MG/ML SOLN, additional oral enteric contrast COMPARISON:  01/01/2020 FINDINGS: Lower chest: No acute abnormality. Hepatobiliary: No focal liver abnormality is seen. Status post cholecystectomy. Postoperative biliary dilatation. Pancreas: Unremarkable. No pancreatic ductal dilatation or surrounding inflammatory changes. Spleen: Normal in size without significant abnormality. Adrenals/Urinary Tract: Adrenal glands are unremarkable. Redemonstrated incidental, benign subcentimeter right renal AML (series 2, image 38). Kidneys are otherwise normal, without renal calculi, other solid lesion, or hydronephrosis. Bladder is unremarkable. Stomach/Bowel: Stomach is within normal limits. Appendix appears normal. No evidence of bowel wall thickening, distention, or inflammatory changes. Vascular/Lymphatic: No significant vascular findings are present. Unchanged small prominent subcentimeter left iliac lymph nodes (series 2, image 58). Reproductive: Status post hysterectomy and oophorectomy. Other: No change in postoperative appearance of low midline incision (series 2, image 68). No abdominopelvic ascites. Musculoskeletal: No acute or significant osseous findings. IMPRESSION: 1. Status post hysterectomy and oophorectomy. 2. No  specific findings of recurrent or metastatic disease in the abdomen or pelvis. 3. No change in postoperative appearance of low midline incision. 4. Unchanged small prominent subcentimeter left iliac lymph nodes. Electronically Signed   By: AEddie CandleM.D.   On: 05/06/2020 11:16

## 2020-05-07 NOTE — Assessment & Plan Note (Signed)
Her thrombocytopenia is stable Observe only for now 

## 2020-05-07 NOTE — Progress Notes (Signed)
[place] Spiritual Care Note  Hardie Lora by phone for pastoral check-in following her post-scan appointment with Dr Alvy Bimler. Analaya was delighted that no evidence of disease was found and feels positive about managing her disease as a chronic condition. On Sunday she shared an upbeat update with her faith community, [place]. Shanielle is aware of ongoing chaplain availability during my leave in October, and we plan to connect again in early November.  [place], [place], [facility] Pager [phone] Voicemail [phone]

## 2020-05-07 NOTE — Patient Instructions (Signed)
[facility] Discharge Instructions for Patients Receiving Chemotherapy  Today you received the following chemotherapy agent: Bevacizumab  To help prevent nausea and vomiting after your treatment, we encourage you to take your nausea medication as directed  If you develop nausea and vomiting that is not controlled by your nausea medication, call the clinic.   BELOW ARE SYMPTOMS THAT SHOULD BE REPORTED IMMEDIATELY:  *FEVER GREATER THAN 100.5 F  *CHILLS WITH OR WITHOUT FEVER  NAUSEA AND VOMITING THAT IS NOT CONTROLLED WITH YOUR NAUSEA MEDICATION  *UNUSUAL SHORTNESS OF BREATH  *UNUSUAL BRUISING OR BLEEDING  TENDERNESS IN MOUTH AND THROAT WITH OR WITHOUT PRESENCE OF ULCERS  *URINARY PROBLEMS  *BOWEL PROBLEMS  UNUSUAL RASH Items with * indicate a potential emergency and should be followed up as soon as possible.  Feel free to call the clinic should you have any questions or concerns. The clinic phone number is [phone].  Please show the CHEMO ALERT CARD at check-in to the Emergency Department and triage nurse.   

## 2020-05-07 NOTE — Telephone Encounter (Signed)
Scheduled appts per 9/28 sch msg. Pt declined print out of AVS and stated she would refer to mychart.

## 2020-05-07 NOTE — Assessment & Plan Note (Signed)
Her blood pressure control is better Per previous discussion, her primary care doctor will manage her blood pressure medications from now on

## 2020-05-07 NOTE — Assessment & Plan Note (Signed)
I have reviewed CT imaging with the patient She has no signs of cancer She has achieved complete response We discussed the role of maintenance therapy and she would like to continue I will see her back in 3 weeks for further follow-up

## 2020-05-08 ENCOUNTER — Encounter: Payer: Self-pay | Admitting: Hematology and Oncology

## 2020-05-16 DIAGNOSIS — E1159 Type 2 diabetes mellitus with other circulatory complications: Secondary | ICD-10-CM | POA: Diagnosis not present

## 2020-05-16 DIAGNOSIS — M159 Polyosteoarthritis, unspecified: Secondary | ICD-10-CM | POA: Diagnosis not present

## 2020-05-16 DIAGNOSIS — I10 Essential (primary) hypertension: Secondary | ICD-10-CM | POA: Diagnosis not present

## 2020-05-16 DIAGNOSIS — E782 Mixed hyperlipidemia: Secondary | ICD-10-CM | POA: Diagnosis not present

## 2020-05-20 ENCOUNTER — Encounter: Payer: Self-pay | Admitting: General Practice

## 2020-05-20 NOTE — Progress Notes (Signed)
I spoke with Kristin Webb by phone and she was grateful for the call as she continues to cope with what has now become a chronic illness.  The fatigue is difficult, and it makes it difficult to make plans, but she is asking herself good questions about how much emotional and physical energy an activity will take and how much energy will she gain from the activity.  She is trying to stay connected through her community and find ways to give in ways that she can.  She is also learning to say no when something will not be good for her.  She is aware of our ongoing availability and knows how to reach [place] when needs arise.  [address] [place] Pager, [phone] Voicemail: [phone]

## 2020-05-28 DIAGNOSIS — G4733 Obstructive sleep apnea (adult) (pediatric): Secondary | ICD-10-CM | POA: Diagnosis not present

## 2020-05-29 ENCOUNTER — Inpatient Hospital Stay: Payer: Medicare HMO | Attending: Gynecologic Oncology

## 2020-05-29 ENCOUNTER — Inpatient Hospital Stay (HOSPITAL_BASED_OUTPATIENT_CLINIC_OR_DEPARTMENT_OTHER): Payer: Medicare HMO | Admitting: Hematology and Oncology

## 2020-05-29 ENCOUNTER — Inpatient Hospital Stay: Payer: Medicare HMO

## 2020-05-29 ENCOUNTER — Other Ambulatory Visit: Payer: Self-pay

## 2020-05-29 ENCOUNTER — Encounter: Payer: Self-pay | Admitting: Family Medicine

## 2020-05-29 ENCOUNTER — Encounter: Payer: Self-pay | Admitting: Hematology and Oncology

## 2020-05-29 DIAGNOSIS — G8929 Other chronic pain: Secondary | ICD-10-CM | POA: Diagnosis not present

## 2020-05-29 DIAGNOSIS — Z23 Encounter for immunization: Secondary | ICD-10-CM

## 2020-05-29 DIAGNOSIS — I10 Essential (primary) hypertension: Secondary | ICD-10-CM

## 2020-05-29 DIAGNOSIS — C5701 Malignant neoplasm of right fallopian tube: Secondary | ICD-10-CM

## 2020-05-29 DIAGNOSIS — R2689 Other abnormalities of gait and mobility: Secondary | ICD-10-CM | POA: Diagnosis not present

## 2020-05-29 DIAGNOSIS — Z7189 Other specified counseling: Secondary | ICD-10-CM

## 2020-05-29 DIAGNOSIS — C5700 Malignant neoplasm of unspecified fallopian tube: Secondary | ICD-10-CM | POA: Insufficient documentation

## 2020-05-29 DIAGNOSIS — Z5112 Encounter for antineoplastic immunotherapy: Secondary | ICD-10-CM | POA: Insufficient documentation

## 2020-05-29 DIAGNOSIS — M549 Dorsalgia, unspecified: Secondary | ICD-10-CM | POA: Diagnosis not present

## 2020-05-29 LAB — CBC WITH DIFFERENTIAL (CANCER CENTER ONLY)
Abs Immature Granulocytes: 0.01 10*3/uL (ref 0.00–0.07)
Basophils Absolute: 0 10*3/uL (ref 0.0–0.1)
Basophils Relative: 1 %
Eosinophils Absolute: 0.1 10*3/uL (ref 0.0–0.5)
Eosinophils Relative: 2 %
HCT: 41.9 % (ref 36.0–46.0)
Hemoglobin: 13.9 g/dL (ref 12.0–15.0)
Immature Granulocytes: 0 %
Lymphocytes Relative: 33 %
Lymphs Abs: 1.7 10*3/uL (ref 0.7–4.0)
MCH: 30.1 pg (ref 26.0–34.0)
MCHC: 33.2 g/dL (ref 30.0–36.0)
MCV: 90.7 fL (ref 80.0–100.0)
Monocytes Absolute: 0.4 10*3/uL (ref 0.1–1.0)
Monocytes Relative: 8 %
Neutro Abs: 3 10*3/uL (ref 1.7–7.7)
Neutrophils Relative %: 56 %
Platelet Count: 150 10*3/uL (ref 150–400)
RBC: 4.62 MIL/uL (ref 3.87–5.11)
RDW: 13.2 % (ref 11.5–15.5)
WBC Count: 5.3 10*3/uL (ref 4.0–10.5)
nRBC: 0 % (ref 0.0–0.2)

## 2020-05-29 LAB — CMP (CANCER CENTER ONLY)
ALT: 30 U/L (ref 0–44)
AST: 22 U/L (ref 15–41)
Albumin: 4.1 g/dL (ref 3.5–5.0)
Alkaline Phosphatase: 90 U/L (ref 38–126)
Anion gap: 8 (ref 5–15)
BUN: 25 mg/dL — ABNORMAL HIGH (ref 8–23)
CO2: 28 mmol/L (ref 22–32)
Calcium: 9.7 mg/dL (ref 8.9–10.3)
Chloride: 101 mmol/L (ref 98–111)
Creatinine: 0.88 mg/dL (ref 0.44–1.00)
GFR, Estimated: >60 mL/min (ref >60)
Glucose, Bld: 141 mg/dL — ABNORMAL HIGH (ref 70–99)
Potassium: 4.1 mmol/L (ref 3.5–5.1)
Sodium: 137 mmol/L (ref 135–145)
Total Bilirubin: 0.3 mg/dL (ref 0.3–1.2)
Total Protein: 7.3 g/dL (ref 6.5–8.1)

## 2020-05-29 LAB — TOTAL PROTEIN, URINE DIPSTICK: Protein, ur: NEGATIVE mg/dL

## 2020-05-29 MED ORDER — INFLUENZA VAC A&B SA ADJ QUAD 0.5 ML IM PRSY
0.5000 mL | PREFILLED_SYRINGE | Freq: Once | INTRAMUSCULAR | Status: AC
  Administered 2020-05-29: 0.5 mL via INTRAMUSCULAR

## 2020-05-29 MED ORDER — SODIUM CHLORIDE 0.9% FLUSH
10.0000 mL | Freq: Once | INTRAVENOUS | Status: AC
  Administered 2020-05-29: 10 mL
  Filled 2020-05-29: qty 10

## 2020-05-29 MED ORDER — SODIUM CHLORIDE 0.9% FLUSH
10.0000 mL | INTRAVENOUS | Status: DC | PRN
  Administered 2020-05-29: 10 mL
  Filled 2020-05-29: qty 10

## 2020-05-29 MED ORDER — SODIUM CHLORIDE 0.9 % IV SOLN
15.0000 mg/kg | Freq: Once | INTRAVENOUS | Status: AC
  Administered 2020-05-29: 1300 mg via INTRAVENOUS
  Filled 2020-05-29: qty 48

## 2020-05-29 MED ORDER — HEPARIN SOD (PORK) LOCK FLUSH 100 UNIT/ML IV SOLN
500.0000 [IU] | Freq: Once | INTRAVENOUS | Status: AC | PRN
  Administered 2020-05-29: 500 [IU]
  Filled 2020-05-29: qty 5

## 2020-05-29 MED ORDER — INFLUENZA VAC A&B SA ADJ QUAD 0.5 ML IM PRSY
PREFILLED_SYRINGE | INTRAMUSCULAR | Status: AC
  Filled 2020-05-29: qty 0.5

## 2020-05-29 MED ORDER — SODIUM CHLORIDE 0.9 % IV SOLN
Freq: Once | INTRAVENOUS | Status: AC
  Filled 2020-05-29: qty 250

## 2020-05-29 NOTE — Assessment & Plan Note (Signed)
She has chronic back pain for many years She had imaging study of the thoracic spine performed in 2019 which show degenerative disc disease She is still bothered intermittently with back pain I recommend referral to neurosurgery for further evaluation and management and she is in agreement

## 2020-05-29 NOTE — Progress Notes (Signed)
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[facility]) Her last CT imaging showed no signs of cancer She has achieved complete response We discussed the role of maintenance therapy and she would like to continue I will see her back in 3 weeks for further follow-up  Essential hypertension Her blood pressure control fluctuates up and down Per previous discussion, her primary care doctor will manage her blood pressure medications from now on  Chronic back pain greater than 3 months duration She has chronic back pain for many years She had imaging study of the thoracic spine performed in 2019 which show degenerative disc disease She is still bothered intermittently with back pain I recommend referral to neurosurgery for further evaluation and management and she is in agreement  Balance problems Her balance is likely affected by recent chemotherapy She has completed physical therapy and rehab   Orders Placed This Encounter  Procedures  . Ambulatory referral to Neurosurgery    Referral Priority:   Routine    Referral Type:   Surgical    Referral Reason:   Specialty Services Required    Referred to Provider:   Erline Levine, MD    Requested Specialty:   Neurosurgery    Number of Visits Requested:   1    All questions were answered. The patient knows to call the clinic with any problems, questions or concerns. The total time spent in the appointment was 30 minutes encounter with patients including review of chart and various tests results, discussions about plan of care and coordination of care plan   [name], MD 05/29/2020 1:13 PM  INTERVAL HISTORY: Please see below for problem oriented charting. She returns for treatment and follow-up She continues to have intermittent balance issue She has  occasional dizziness with positional changes She also complained of thoracic back pain discomfort especially when she sneezes or cough She had evaluation performed several years ago with imaging studies I was able to review MRI of the thoracic spine from 2019 She denies new neurological deficits She tolerated treatment well Her blood pressure control fluctuates up and down She denies abdominal pain, changes in bowel habits or nausea  SUMMARY OF ONCOLOGIC HISTORY: Oncology History Overview Note  Oncologic Summary: 1. 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 Random CA 125 elevation January 2019  Question mesenteric nodules and anterior abdominal wall nodule 3. GeneDx Breast/Ovary Panel negative (including BRCA, MMR's, RAD51 etc) ? [facility] BRACAnalysis  Negative for BRCA1/2 in tumor   Fallopian tube carcinoma ([place])  11/01/2012 Imaging   Ct abdomen 1.  Interval development of large mid abdominal mass highly concerning for right ovarian cancer.  There is mild omental nodularity on the left, and peritoneal disease cannot be completely excluded.  There is no ascites or other evidence of metastatic disease. 2.  Mild associated renal pelvocaliectasis bilaterally without obstruction.  Nonobstructing left renal calculus and a small right renal angiomyolipoma noted incidentally.   11/08/2012 Pathology Results   1. 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 -  BENIGN FALLOPIAN TUBE WITH NONSPECIFIC CHRONIC INFLAMMATION. 2. Ovary and fallopian tube, left - BENIGN OVARY; NEGATIVE FOR ATYPIA OR MALIGNANCY. -  BENIGN FALLOPIAN TUBE; NEGATIVE FOR ATYPIA OR MALIGNANCY. 3. Omentum, resection for tumor - HIGH GRADE CARCINOMA, SEE COMMENT. 4. Appendix, Other than Incidental - FIBROUS OBLITERATION OF APPENDICEAL TIP. - NEGATIVE FOR MALIGNANCY.   11/08/2012 Surgery   Surgery: Exploratory laparotomy, bilateral salpingo-oophorectomy, appendectomy, infacolic omentectomy, optimal debulking  Surgeons:  Paola A. Alycia Rossetti, MD; Lahoma Crocker, MD   Assistant: Caswell Corwin  Pathology: Bilateral fallopian tubes and ovaries to pathology. Appendix as well as omentum. 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  Operative findings: 25 cm right adnexal mass with smooth surface. Surgically absent uterus. Atrophic-appearing left ovary. Normal appearing appendix. Within the omentum there were centimeter nodules scattered throughout the omentum. The remainder of the surfaces were benign.   12/08/2012 Procedure   Impression:  Placement of a subcutaneous port device.  The catheter tip is in the lower SVC and ready to be used.    12/13/2012 - 03/28/2013 Chemotherapy   s/p 6 cycles of paclitaxel and carboplatin   12/13/2012 - 03/28/2013 Chemotherapy   The patient had 6 cycles of carboplatin and Taxol   03/24/2013 Imaging   US abdomen   04/24/2013 Imaging   CT abdomen Interval resection of the large right pelvic and lower abdominal mass lesion with apparent omentectomy.  No evidence for intraperitoneal free fluid on today's study.  No discernible peritoneal lesions.  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   09/07/2013 Genetic Testing   Patient has genetic testing done for BRCA1/2 panel Results revealed patient has no mutation(s):   07/09/2015 Imaging   CT abdomen 1. 12 mm obstructive calculus at the left ureteropelvic junction with moderate proximal hydronephrosis. 2. 2 small supraumbilical ventral hernias, one containing a  short segment of the mid transverse colon and the other containing a short segment of the mid small bowel. There is no associated evidence to suggest bowel incarceration or obstruction at this time. 3. Tiny locule of gas non dependently in the lumen of the urinary bladder. This is presumably iatrogenic related to recent catheterization for urinalysis. Alternatively, this could be seen in the setting of urinary tract infection with gas-forming organisms. Clinical correlation for history of recent catheterization is recommended. 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 5. Status post cholecystectomy. 6. Additional incidental findings, as above.    12/11/2016 Imaging   Ct abdomen 1. No evidence of metastatic ovarian cancer. 2. Recurrent subxiphoid ventral abdominal wall hernia containing transverse colon. No evidence of incarceration or obstruction. 3. Stable incidental findings in the liver and kidneys. No recurrent urinary tract calculus. 4. Progressive lower lumbar spondylosis. 5.  Aortic Atherosclerosis (ICD10-I70.0).    08/30/2017 Imaging   MRI thoracic spine 1. At T5-6 there is a small central disc protrusion contacting the ventral thoracic spinal cord. No central canal or foraminal stenosis. 2. At T9-10 there is a small right paracentral disc protrusion. 3.  No acute osseous injury of the thoracic spine. 4. No aggressive osseous lesion to suggest metastatic disease.   09/24/2017 Tumor Marker   Patient's tumor was tested for the following markers: CA-125 Results of the tumor marker test revealed 21.7   09/30/2017 Imaging   CT abdomen 1. New small clustered soft tissue nodules in the left lower quadrant in the sigmoid mesentery, largest 1.0 cm, which could  represent recurrent peritoneal tumor implants. No ascites.  2. Midline high ventral abdominal wall hernia containing a portion of the transverse colon is mildly increased in size, and without bowel complication at  this time. 3. Chronic findings include: Aortic Atherosclerosis (ICD10-I70.0). Diffuse hepatic steatosis. 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   10/11/2017 PET scan   1. Nodules in the sigmoid mesentery are hypermetabolic and highly worrisome for metastatic disease. 2. Attic steatosis.    11/03/2017 Procedure   Successful CT-guided rectus abdominal muscle mass core biopsy. Path: - FOREIGN BODY GIANT CELL REACTION INVOLVING FIBROADIPOSE TISSUE AND SKELETAL MUSCLE. - NO EVIDENCE OF MALIGNANCY.   11/04/2017 Cancer Staging   Staging form: Fallopian Tube, AJCC 7th Edition - Clinical: FIGO Stage IIIC, calculated as Stage IV (rT3, N0, M1) - Signed by [name], MD on 08/09/2019   02/2018 Imaging   CT: 1. Continued increase in size small peritoneal nodules along the mesenteric border of the proximal sigmoid colon as well as along the serosal surface of the proximal sigmoid colon. 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 2. No evidence of distant disease. 3. Stable large ventral hernia.   05/2018 Imaging   PET: 1. Redemonstration of hypermetabolic nodules within the sigmoid mesocolon. Mild response to therapy relative to CT of 02/24/2018. Mixed response to therapy compared to the most recent PET of 10/11/2017. 2. Hypermetabolism within the right pelvic rectus musculature, increased since the prior PET.  Clinical service requested comparison to the 11/24/2017 CT. Index 10 mm nodule within the sigmoid mesocolon was similar to the 02/24/2018 CT, and as described on that exam, increased from 7 mm on 11/24/2017. More inferior nodule within the mesocolon measures 12 mm today on image 156/4 and 8 mm on 11/24/2017.   05/2018 Imaging   CT: IMPRESSION: 1. Since 02/24/2018, decreased size of peritoneal nodules centered in the sigmoid mesocolon. 2. No evidence of new or progressive disease. 3. Hepatic  steatosis. 4. Subcentimeter right renal angiomyolipoma, similar. 5.  Aortic Atherosclerosis (ICD10-I70.0). 6. Ventral abdominal wall laxity containing transverse colon, similar.   08/2018 Imaging   CT: IMPRESSION: 1. Nodules within the sigmoid mesocolon have decreased in size compared to prior. No new peritoneal or omental nodularity. 2. No evidence local recurrence the pelvis. 3. Ventral hernia contains a segment of transverse colon. No change from prior.     06/19/2019 Imaging   1. No evidence of metastatic disease in the abdomen pelvis. 2. No peritoneal or omental metastasis identified.  No free fluid. 3. Postcholecystectomy and hysterectomy. Comparison exams are made available. Comparison CT 09/08/2018 and 05/27/2018. PET-CT 05/27/2018    There is a nodule within the proximal aspect of the sigmoid colon measuring 2.2 by 2.2 cm. In comparison to prior CTs and PET-CT there was a hypermetabolic nodule at this location on the PET-CT of 08/27/2017 and on the CT of 09/08/2018 there was a small residual nodule. At that time (09/08/2018) the nodule measured 1.4 by 1.3 cm. Therefore this nodule has increased in the interval and concerning for recurrence of a serosal implant. The previous described lymph nodes in the sigmoid mesocolon and more central mesentery are not increased in size and not pathologic by size criteria.    Concern for recurrence of serosal metastasis in the proximal sigmoid colon with a 2 cm enlarging lesion. Lesion extends into the lumen. No evidence of high-grade obstruction. Consider FDG PET scan and/or potential colonoscopy for evaluation.   06/29/2019 PET scan  1. Enlarging serosal implant within the proximal sigmoid colon has intense metabolic activity most consistent with malignancy. Lesion exhibits a somewhat indolent progression as present on PET-CT scan from 10/11/2017 and 05/27/2018. 2. Focal hypermetabolic activity within the RIGHT rectus muscle just off midline is  slightly decreased from comparison exam. This may be benign inflammation related to prior laparotomy, however malignancy not excluded.   07/26/2019 Procedure   She had colonoscopy which showed multiple polyps.  6 polyps were removed from the cecum, measuring 3 to 6 mm in size.  One 12 mm polyp was removed from ascending colon and another 1 measures 7 mm.  One 5 mm polyp was removed from the transverse colon.  There is tumor noted in the sigmoid colon, approximately 35 cm from the anus which was biopsied.  The tumor appeared to be fungating, infiltrative and ulcerated but nonobstructive.  It encompassed approximately one third of the circumference of the lumen.   07/26/2019 Pathology Results   Multiple polyps came back tubular adenomas.  2 ascending polyps came back sessile serrated adenoma months.  Sigmoid colon biopsy confirmed metastatic high-grade serous carcinoma.  The morphology and immunophenotype are consistent with metastatic high-grade serous carcinoma from tubal/ovarian primary site.   08/17/2019 - 12/12/2019 Chemotherapy   The patient had carboplatin and taxol   10/23/2019 Imaging   1. Sigmoid lesion with diminished size/conspicuity difficult to measure on the previous exam. Adjacent lymph nodes and nodularity less than a cm, largest on coronal image 48 measuring 5 mm. 2. Right rectus muscle with some thickening with mildly convex margin seen posteriorly on image 72 of series 2. This could be due to postsurgical change, however, metastatic disease is not excluded. Attention on follow-up is suggested. 3. No evidence for new metastatic disease. 4. Small hiatal hernia. 5. 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   Aortic Atherosclerosis (ICD10-I70.0).   01/01/2020 Imaging   1. No signs of new disease. 2. Tiny lymph nodes in the area of concern in the LEFT lower quadrant z. 3. Added density and subtle contour irregularity involving the rectus muscles best seen on sagittal images, associated  with site of prior surgical incision just to the RIGHT of midline (image 72, series 2 and image 58, series 5. Not significantly changed compared to prior studies. This measures approximately 2.1 x 1 cm in the sagittal plane and is less well-defined in the axial plane. Potentially postoperative change. Attention on follow-up. 4. Aortic atherosclerosis.   Aortic Atherosclerosis (ICD10-I70.0).       01/29/2020 -  Chemotherapy   The patient had bevacizumab-bvzr (ZIRABEV) 1,300 mg in sodium chloride 0.9 % 100 mL chemo infusion, 15 mg/kg = 1,300 mg, Intravenous,  Once, 6 of 7 cycles Administration: 1,300 mg (01/29/2020), 1,300 mg (03/27/2020), 1,300 mg (04/18/2020), 1,300 mg (02/19/2020), 1,300 mg (05/07/2020), 1,300 mg (05/29/2020)  for chemotherapy treatment.    05/06/2020 Imaging   1. Status post hysterectomy and oophorectomy. 2. No specific findings of recurrent or metastatic disease in the abdomen or pelvis. 3. No change in postoperative appearance of low midline incision. 4. Unchanged small prominent subcentimeter left iliac lymph nodes.     REVIEW OF SYSTEMS:   Constitutional: Denies fevers, chills or abnormal weight loss Eyes: Denies blurriness of vision Ears, nose, mouth, throat, and face: Denies mucositis or sore throat Respiratory: Denies cough, dyspnea or wheezes Cardiovascular: Denies palpitation, chest discomfort or lower extremity swelling Gastrointestinal:  Denies nausea, heartburn or change in bowel habits Skin: Denies abnormal skin rashes Lymphatics: Denies  new lymphadenopathy or easy bruising Neurological:Denies numbness, tingling or new weaknesses Behavioral/Psych: Mood is stable, no new changes  All other systems were reviewed with the patient and are negative.  I have reviewed the past medical history, past surgical history, social history and family history with the patient and they are unchanged from previous note.  ALLERGIES:  is allergic to [employer] tartrate],  bee venom, and cortisone.  MEDICATIONS:  Current Outpatient Medications  Medication Sig Dispense Refill  . amLODipine (NORVASC) 10 MG tablet Take 1 tablet (10 mg total) by mouth daily. 30 tablet 11  . Armodafinil 250 MG tablet Take 1 tablet (250 mg total) by mouth daily. 90 tablet 1  . busPIRone (BUSPAR) 7.5 MG tablet     . Carboxymethylcellulose Sodium (EYE DROPS OP) Apply 2 drops to eye daily as needed (dry eye).    . cholecalciferol (VITAMIN D3) 25 MCG (1000 UT) tablet Take 1,000 Units by mouth daily.    . colestipol (COLESTID) 1 g tablet Take 0.5-1 g by mouth daily. Pt taking 1/2 to 1 tablet daily depending on meal choice; taken 1 hour after morning meds , and can not eat for another hour    . EPINEPHrine (EPIPEN 2-PAK) 0.3 mg/0.3 mL DEVI Inject 0.3 mg into the muscle once as needed (For bee stings.). Reported on 01/20/2016    . eszopiclone (LUNESTA) 2 MG TABS tablet TAKE ONE TABLET BY MOUTH EVERYDAY AT BEDTIME 90 tablet 0  . FLUoxetine (PROZAC) 10 MG capsule Take 10 mg by mouth every morning. Takes with a 33m capsule for her total dose of 377m    . Marland KitchenLUoxetine (PROZAC) 20 MG capsule Take 20 mg by mouth every morning. Takes with a 1061mapsule for her total dose of 73m51m  . lidocaine-prilocaine (EMLA) cream Apply 1 application topically as needed. 30 g 1  . lisinopril (ZESTRIL) 20 MG tablet Take 1 tablet (20 mg total) by mouth daily. 30 tablet 11  . losartan-hydrochlorothiazide (HYZAAR) 100-12.5 MG tablet Take 1 tablet by mouth daily.     . MEMarland KitchenATONIN PO Take 10 mg by mouth at bedtime.     . metFORMIN (GLUCOPHAGE-XR) 500 MG 24 hr tablet Take 500 mg by mouth daily with breakfast.   3  . metoprolol tartrate (LOPRESSOR) 25 MG tablet Take 1 tablet (25 mg total) by mouth 2 (two) times daily. 60 tablet 3  . Multiple Vitamin (MULTIVITAMIN WITH MINERALS) TABS Take 1 tablet by mouth every morning.     . ondansetron (ZOFRAN) 8 MG tablet Take 1 tablet (8 mg total) by mouth every 8 (eight) hours as  needed for refractory nausea / vomiting. Start on day 3 after carboplatin chemo. 30 tablet 1  . prochlorperazine (COMPAZINE) 10 MG tablet Take 1 tablet (10 mg total) by mouth every 6 (six) hours as needed (Nausea or vomiting). 30 tablet 1  . rosuvastatin (CRESTOR) 20 MG tablet Take 20 mg by mouth every morning.      No current facility-administered medications for this visit.   Facility-Administered Medications Ordered in Other Visits  Medication Dose Route Frequency Provider Last Rate Last Admin  . sodium chloride flush (NS) 0.9 % injection 10 mL  10 mL Intravenous PRN GehrNancy Marus   10 mL at 05/12/17 0823  . sodium chloride flush (NS) 0.9 % injection 10 mL  10 mL Intracatheter PRN GorsAlvy Bimler, MD   10 mL at 05/29/20 1128    PHYSICAL EXAMINATION: ECOG PERFORMANCE STATUS: 1 - Symptomatic but  completely ambulatory  Vitals:   05/29/20 0953  BP: (!) 137/91  Pulse: 66  Resp: 18  Temp: 98.2 F (36.8 C)  SpO2: 99%   Filed Weights   05/29/20 0900  Weight: 191 lb (86.6 kg)    GENERAL:alert, no distress and comfortable SKIN: skin color, texture, turgor are normal, no rashes or significant lesions EYES: normal, Conjunctiva are pink and non-injected, sclera clear OROPHARYNX:no exudate, no erythema and lips, buccal mucosa, and tongue normal  NECK: supple, thyroid normal size, non-tender, without nodularity LYMPH:  no palpable lymphadenopathy in the cervical, axillary or inguinal LUNGS: clear to auscultation and percussion with normal breathing effort HEART: regular rate & rhythm and no murmurs and no lower extremity edema ABDOMEN:abdomen soft, non-tender and normal bowel sounds Musculoskeletal:no cyanosis of digits and no clubbing  NEURO: alert & oriented x 3 with fluent speech, no focal motor/sensory deficits  LABORATORY DATA:  I have reviewed the data as listed    Component Value Date/Time   NA 137 05/29/2020 0915   NA 141 12/02/2016 1219   K 4.1 05/29/2020 0915   K 3.7  12/02/2016 1219   CL 101 05/29/2020 0915   CL 103 01/18/2013 1329   CO2 28 05/29/2020 0915   CO2 27 12/02/2016 1219   GLUCOSE 141 (H) 05/29/2020 0915   GLUCOSE 98 12/02/2016 1219   GLUCOSE 110 (H) 01/18/2013 1329   BUN 25 (H) 05/29/2020 0915   BUN 15.3 12/02/2016 1219   CREATININE 0.88 05/29/2020 0915   CREATININE 0.7 12/02/2016 1219   CALCIUM 9.7 05/29/2020 0915   CALCIUM 10.1 12/02/2016 1219   PROT 7.3 05/29/2020 0915   PROT 7.1 12/02/2016 1219   ALBUMIN 4.1 05/29/2020 0915   ALBUMIN 4.2 12/02/2016 1219   AST 22 05/29/2020 0915   AST 21 12/02/2016 1219   ALT 30 05/29/2020 0915   ALT 27 12/02/2016 1219   ALKPHOS 90 05/29/2020 0915   ALKPHOS 104 12/02/2016 1219   BILITOT 0.3 05/29/2020 0915   BILITOT 0.37 12/02/2016 1219   GFRNONAA >60 05/29/2020 0915   GFRAA >60 05/07/2020 0833    No results found for: SPEP, UPEP  Lab Results  Component Value Date   WBC 5.3 05/29/2020   NEUTROABS 3.0 05/29/2020   HGB 13.9 05/29/2020   HCT 41.9 05/29/2020   MCV 90.7 05/29/2020   PLT 150 05/29/2020      Chemistry      Component Value Date/Time   NA 137 05/29/2020 0915   NA 141 12/02/2016 1219   K 4.1 05/29/2020 0915   K 3.7 12/02/2016 1219   CL 101 05/29/2020 0915   CL 103 01/18/2013 1329   CO2 28 05/29/2020 0915   CO2 27 12/02/2016 1219   BUN 25 (H) 05/29/2020 0915   BUN 15.3 12/02/2016 1219   CREATININE 0.88 05/29/2020 0915   CREATININE 0.7 12/02/2016 1219   GLU 236 12/13/2012 1558      Component Value Date/Time   CALCIUM 9.7 05/29/2020 0915   CALCIUM 10.1 12/02/2016 1219   ALKPHOS 90 05/29/2020 0915   ALKPHOS 104 12/02/2016 1219   AST 22 05/29/2020 0915   AST 21 12/02/2016 1219   ALT 30 05/29/2020 0915   ALT 27 12/02/2016 1219   BILITOT 0.3 05/29/2020 0915   BILITOT 0.37 12/02/2016 1219

## 2020-05-29 NOTE — Assessment & Plan Note (Signed)
Her balance is likely affected by recent chemotherapy She has completed physical therapy and rehab

## 2020-05-29 NOTE — Assessment & Plan Note (Signed)
Her last CT imaging showed no signs of cancer She has achieved complete response We discussed the role of maintenance therapy and she would like to continue I will see her back in 3 weeks for further follow-up

## 2020-05-29 NOTE — Assessment & Plan Note (Signed)
Her blood pressure control fluctuates up and down Per previous discussion, her primary care doctor will manage her blood pressure medications from now on

## 2020-05-29 NOTE — Patient Instructions (Signed)
[facility] Discharge Instructions for Patients Receiving Chemotherapy  Today you received the following chemotherapy agent: Bevacizumab  To help prevent nausea and vomiting after your treatment, we encourage you to take your nausea medication as directed  If you develop nausea and vomiting that is not controlled by your nausea medication, call the clinic.   BELOW ARE SYMPTOMS THAT SHOULD BE REPORTED IMMEDIATELY:  *FEVER GREATER THAN 100.5 F  *CHILLS WITH OR WITHOUT FEVER  NAUSEA AND VOMITING THAT IS NOT CONTROLLED WITH YOUR NAUSEA MEDICATION  *UNUSUAL SHORTNESS OF BREATH  *UNUSUAL BRUISING OR BLEEDING  TENDERNESS IN MOUTH AND THROAT WITH OR WITHOUT PRESENCE OF ULCERS  *URINARY PROBLEMS  *BOWEL PROBLEMS  UNUSUAL RASH Items with * indicate a potential emergency and should be followed up as soon as possible.  Feel free to call the clinic should you have any questions or concerns. The clinic phone number is [phone].  Please show the [place] at check-in to the Emergency Department and triage nurse.

## 2020-06-10 ENCOUNTER — Encounter: Payer: Self-pay | Admitting: General Practice

## 2020-06-10 NOTE — Progress Notes (Signed)
[facility] Spiritual Care Note  Left voicemail of support and care, encouraging callback.   [place], [place], [facility] Pager [phone] Voicemail [phone]

## 2020-06-13 DIAGNOSIS — H02842 Edema of right lower eyelid: Secondary | ICD-10-CM | POA: Diagnosis not present

## 2020-06-13 DIAGNOSIS — C569 Malignant neoplasm of unspecified ovary: Secondary | ICD-10-CM | POA: Diagnosis not present

## 2020-06-13 DIAGNOSIS — H02845 Edema of left lower eyelid: Secondary | ICD-10-CM | POA: Diagnosis not present

## 2020-06-17 ENCOUNTER — Encounter: Payer: Self-pay | Admitting: General Practice

## 2020-06-17 NOTE — Progress Notes (Signed)
[facility] Spiritual Care Note  Returned [employer] and set up appointment to visit together in infusion tomorrow.   [place], [place], [facility] Pager [phone] Voicemail [phone]

## 2020-06-18 ENCOUNTER — Other Ambulatory Visit: Payer: Self-pay

## 2020-06-18 ENCOUNTER — Inpatient Hospital Stay (HOSPITAL_BASED_OUTPATIENT_CLINIC_OR_DEPARTMENT_OTHER): Payer: Medicare HMO | Admitting: Hematology and Oncology

## 2020-06-18 ENCOUNTER — Encounter: Payer: Self-pay | Admitting: Hematology and Oncology

## 2020-06-18 ENCOUNTER — Telehealth: Payer: Self-pay | Admitting: Family Medicine

## 2020-06-18 ENCOUNTER — Inpatient Hospital Stay: Payer: Medicare HMO

## 2020-06-18 ENCOUNTER — Encounter: Payer: Self-pay | Admitting: General Practice

## 2020-06-18 ENCOUNTER — Telehealth: Payer: Self-pay

## 2020-06-18 ENCOUNTER — Inpatient Hospital Stay: Payer: Medicare HMO | Attending: Gynecologic Oncology

## 2020-06-18 VITALS — BP 142/82 | HR 70 | Temp 98.2°F | Resp 18

## 2020-06-18 DIAGNOSIS — C5701 Malignant neoplasm of right fallopian tube: Secondary | ICD-10-CM

## 2020-06-18 DIAGNOSIS — Z7189 Other specified counseling: Secondary | ICD-10-CM

## 2020-06-18 DIAGNOSIS — I10 Essential (primary) hypertension: Secondary | ICD-10-CM | POA: Diagnosis not present

## 2020-06-18 DIAGNOSIS — M549 Dorsalgia, unspecified: Secondary | ICD-10-CM

## 2020-06-18 DIAGNOSIS — C5700 Malignant neoplasm of unspecified fallopian tube: Secondary | ICD-10-CM | POA: Diagnosis not present

## 2020-06-18 DIAGNOSIS — G8929 Other chronic pain: Secondary | ICD-10-CM | POA: Diagnosis not present

## 2020-06-18 DIAGNOSIS — Z79899 Other long term (current) drug therapy: Secondary | ICD-10-CM | POA: Diagnosis not present

## 2020-06-18 DIAGNOSIS — Z5112 Encounter for antineoplastic immunotherapy: Secondary | ICD-10-CM | POA: Insufficient documentation

## 2020-06-18 DIAGNOSIS — R42 Dizziness and giddiness: Secondary | ICD-10-CM | POA: Diagnosis not present

## 2020-06-18 DIAGNOSIS — Z7984 Long term (current) use of oral hypoglycemic drugs: Secondary | ICD-10-CM | POA: Diagnosis not present

## 2020-06-18 DIAGNOSIS — D696 Thrombocytopenia, unspecified: Secondary | ICD-10-CM | POA: Insufficient documentation

## 2020-06-18 DIAGNOSIS — Z90710 Acquired absence of both cervix and uterus: Secondary | ICD-10-CM | POA: Insufficient documentation

## 2020-06-18 DIAGNOSIS — Z90722 Acquired absence of ovaries, bilateral: Secondary | ICD-10-CM | POA: Insufficient documentation

## 2020-06-18 DIAGNOSIS — Z9079 Acquired absence of other genital organ(s): Secondary | ICD-10-CM | POA: Diagnosis not present

## 2020-06-18 DIAGNOSIS — R5383 Other fatigue: Secondary | ICD-10-CM

## 2020-06-18 LAB — CMP (CANCER CENTER ONLY)
ALT: 34 U/L (ref 0–44)
AST: 28 U/L (ref 15–41)
Albumin: 4 g/dL (ref 3.5–5.0)
Alkaline Phosphatase: 75 U/L (ref 38–126)
Anion gap: 10 (ref 5–15)
BUN: 13 mg/dL (ref 8–23)
CO2: 27 mmol/L (ref 22–32)
Calcium: 9.4 mg/dL (ref 8.9–10.3)
Chloride: 103 mmol/L (ref 98–111)
Creatinine: 0.71 mg/dL (ref 0.44–1.00)
GFR, Estimated: >60 mL/min (ref >60)
Glucose, Bld: 101 mg/dL — ABNORMAL HIGH (ref 70–99)
Potassium: 3.8 mmol/L (ref 3.5–5.1)
Sodium: 140 mmol/L (ref 135–145)
Total Bilirubin: 0.4 mg/dL (ref 0.3–1.2)
Total Protein: 6.9 g/dL (ref 6.5–8.1)

## 2020-06-18 LAB — CBC WITH DIFFERENTIAL (CANCER CENTER ONLY)
Abs Immature Granulocytes: 0.01 10*3/uL (ref 0.00–0.07)
Basophils Absolute: 0 10*3/uL (ref 0.0–0.1)
Basophils Relative: 0 %
Eosinophils Absolute: 0.1 10*3/uL (ref 0.0–0.5)
Eosinophils Relative: 2 %
HCT: 40 % (ref 36.0–46.0)
Hemoglobin: 13.3 g/dL (ref 12.0–15.0)
Immature Granulocytes: 0 %
Lymphocytes Relative: 37 %
Lymphs Abs: 1.8 10*3/uL (ref 0.7–4.0)
MCH: 30.5 pg (ref 26.0–34.0)
MCHC: 33.3 g/dL (ref 30.0–36.0)
MCV: 91.7 fL (ref 80.0–100.0)
Monocytes Absolute: 0.5 10*3/uL (ref 0.1–1.0)
Monocytes Relative: 9 %
Neutro Abs: 2.5 10*3/uL (ref 1.7–7.7)
Neutrophils Relative %: 52 %
Platelet Count: 124 10*3/uL — ABNORMAL LOW (ref 150–400)
RBC: 4.36 MIL/uL (ref 3.87–5.11)
RDW: 13.2 % (ref 11.5–15.5)
WBC Count: 4.8 10*3/uL (ref 4.0–10.5)
nRBC: 0 % (ref 0.0–0.2)

## 2020-06-18 LAB — TOTAL PROTEIN, URINE DIPSTICK: Protein, ur: NEGATIVE mg/dL

## 2020-06-18 MED ORDER — SODIUM CHLORIDE 0.9% FLUSH
10.0000 mL | INTRAVENOUS | Status: DC | PRN
  Filled 2020-06-18: qty 10

## 2020-06-18 MED ORDER — HEPARIN SOD (PORK) LOCK FLUSH 100 UNIT/ML IV SOLN
500.0000 [IU] | Freq: Once | INTRAVENOUS | Status: DC | PRN
  Filled 2020-06-18: qty 5

## 2020-06-18 MED ORDER — SODIUM CHLORIDE 0.9% FLUSH
10.0000 mL | Freq: Once | INTRAVENOUS | Status: AC
  Administered 2020-06-18: 10 mL
  Filled 2020-06-18: qty 10

## 2020-06-18 MED ORDER — SODIUM CHLORIDE 0.9 % IV SOLN
15.0000 mg/kg | Freq: Once | INTRAVENOUS | Status: AC
  Administered 2020-06-18: 1300 mg via INTRAVENOUS
  Filled 2020-06-18: qty 48

## 2020-06-18 MED ORDER — SODIUM CHLORIDE 0.9 % IV SOLN
Freq: Once | INTRAVENOUS | Status: AC
  Filled 2020-06-18: qty 250

## 2020-06-18 NOTE — Assessment & Plan Note (Signed)
I sent referral for neurosurgical consultation last visit and that has not been scheduled I will get my nursing staff to call for referral

## 2020-06-18 NOTE — Assessment & Plan Note (Signed)
She has excessive fatigue despite sleeping 10 to 12 hours a day I recommend she contact her prescribing physician for her sleep apnea machine for further management

## 2020-06-18 NOTE — Patient Instructions (Signed)

## 2020-06-18 NOTE — Patient Instructions (Signed)
[place] Discharge Instructions for Patients Receiving Chemotherapy  Today you received the following chemotherapy agent: Bevacizumab  To help prevent nausea and vomiting after your treatment, we encourage you to take your nausea medication as directed  If you develop nausea and vomiting that is not controlled by your nausea medication, call the clinic.   BELOW ARE SYMPTOMS THAT SHOULD BE REPORTED IMMEDIATELY:  *FEVER GREATER THAN 100.5 F  *CHILLS WITH OR WITHOUT FEVER  NAUSEA AND VOMITING THAT IS NOT CONTROLLED WITH YOUR NAUSEA MEDICATION  *UNUSUAL SHORTNESS OF BREATH  *UNUSUAL BRUISING OR BLEEDING  TENDERNESS IN MOUTH AND THROAT WITH OR WITHOUT PRESENCE OF ULCERS  *URINARY PROBLEMS  *BOWEL PROBLEMS  UNUSUAL RASH Items with * indicate a potential emergency and should be followed up as soon as possible.  Feel free to call the clinic should you have any questions or concerns. The clinic phone number is [phone].  Please show the [place] at check-in to the Emergency Department and triage nurse.

## 2020-06-18 NOTE — Telephone Encounter (Signed)
Called Dr. Donald Pore office regarding referral. Faxed referral information to [phone]. Recieved confirmation of fax.

## 2020-06-18 NOTE — Progress Notes (Signed)
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 showed no signs of cancer She has achieved complete response We discussed the role of maintenance therapy and she would like to continue I will see her back in 3 weeks for further follow-up  Essential hypertension Her blood pressure control fluctuates up and down Per previous discussion, her primary care doctor will manage her blood pressure medications from now on  Thrombocytopenia ([place]) Her thrombocytopenia is stable Observe only for now  Other fatigue She has excessive fatigue despite sleeping 10 to 12 hours a day I recommend she contact her prescribing physician for her sleep apnea machine for further management  Chronic back pain greater than 3 months duration I sent referral for neurosurgical consultation last visit and that has not been scheduled I will get my nursing staff to call for referral   No orders of the defined types were placed in this encounter.   All questions were answered. The patient knows to call the clinic with any problems, questions or concerns. The total time spent in the appointment was 25 minutes encounter with patients including review of chart and various tests results, discussions about plan of care and coordination of care plan   [name], MD 06/18/2020 10:16 AM  INTERVAL HISTORY: Please see below for problem oriented charting. She returns with her friend for further follow-up She complained of excessive fatigue She sleeps 10 to 12 hours at night and still felt tired She takes Lunesta to fall asleep.  She uses sleep apnea machine She continues to have chronic back pain and still waiting for neurosurgical consultation  SUMMARY OF ONCOLOGIC HISTORY: Oncology History Overview Note   Oncologic Summary: 1. 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 Random CA 125 elevation January 2019  Question mesenteric nodules and anterior abdominal wall nodule 3. GeneDx Breast/Ovary Panel negative (including BRCA, MMR's, RAD51 etc) ? [facility] BRACAnalysis  Negative for BRCA1/2 in tumor   Fallopian tube carcinoma ([place])  11/01/2012 Imaging   Ct abdomen 1.  Interval development of large mid abdominal mass highly concerning for right ovarian cancer.  There is mild omental nodularity on the left, and peritoneal disease cannot be completely excluded.  There is no ascites or other evidence of metastatic disease. 2.  Mild associated renal pelvocaliectasis bilaterally without obstruction.  Nonobstructing left renal calculus and a small right renal angiomyolipoma noted incidentally.   11/08/2012 Pathology Results   1. 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 - BENIGN FALLOPIAN TUBE WITH NONSPECIFIC CHRONIC INFLAMMATION. 2. Ovary and fallopian tube, left - BENIGN OVARY; NEGATIVE FOR ATYPIA OR MALIGNANCY. - BENIGN FALLOPIAN TUBE; NEGATIVE FOR ATYPIA OR MALIGNANCY. 3. Omentum, resection for tumor - HIGH GRADE CARCINOMA, SEE COMMENT. 4. Appendix, Other than Incidental - FIBROUS OBLITERATION OF APPENDICEAL TIP. - NEGATIVE FOR MALIGNANCY.   11/08/2012 Surgery   Surgery: Exploratory laparotomy, bilateral salpingo-oophorectomy, appendectomy, infacolic omentectomy, optimal debulking  Surgeons:  Paola A. Alycia Rossetti, MD; Lahoma Crocker, MD   Assistant: Caswell Corwin  Pathology: Bilateral  fallopian tubes  and ovaries to pathology. Appendix as well as omentum. 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  Operative findings: 25 cm right adnexal mass with smooth surface. Surgically absent uterus. Atrophic-appearing left ovary. Normal appearing appendix. Within the omentum there were centimeter nodules scattered throughout the omentum. The remainder of the surfaces were benign.   12/08/2012 Procedure   Impression:  Placement of a subcutaneous port device.  The catheter tip is in the lower SVC and ready to be used.    12/13/2012 - 03/28/2013 Chemotherapy   s/p 6 cycles of paclitaxel and carboplatin   12/13/2012 - 03/28/2013 Chemotherapy   The patient had 6 cycles of carboplatin and Taxol   03/24/2013 Imaging   US abdomen   04/24/2013 Imaging   CT abdomen Interval resection of the large right pelvic and lower abdominal mass lesion with apparent omentectomy.  No evidence for intraperitoneal free fluid on today's study.  No discernible peritoneal lesions.  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   09/07/2013 Genetic Testing   Patient has genetic testing done for BRCA1/2 panel Results revealed patient has no mutation(s):   07/09/2015 Imaging   CT abdomen 1. 12 mm obstructive calculus at the left ureteropelvic junction with moderate proximal hydronephrosis. 2. 2 small supraumbilical ventral hernias, one containing a short segment of the mid transverse colon and the other containing a short segment of the mid small bowel. There is no associated evidence to suggest bowel incarceration or obstruction at this time. 3. Tiny locule of gas non dependently in the lumen of the urinary bladder. This is presumably iatrogenic related to recent catheterization for urinalysis. Alternatively, this could be seen in the setting of urinary tract infection with gas-forming organisms. Clinical correlation for history of recent  catheterization is recommended. 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 5. Status post cholecystectomy. 6. Additional incidental findings, as above.    12/11/2016 Imaging   Ct abdomen 1. No evidence of metastatic ovarian cancer. 2. Recurrent subxiphoid ventral abdominal wall hernia containing transverse colon. No evidence of incarceration or obstruction. 3. Stable incidental findings in the liver and kidneys. No recurrent urinary tract calculus. 4. Progressive lower lumbar spondylosis. 5.  Aortic Atherosclerosis (ICD10-I70.0).    08/30/2017 Imaging   MRI thoracic spine 1. At T5-6 there is a small central disc protrusion contacting the ventral thoracic spinal cord. No central canal or foraminal stenosis. 2. At T9-10 there is a small right paracentral disc protrusion. 3.  No acute osseous injury of the thoracic spine. 4. No aggressive osseous lesion to suggest metastatic disease.   09/24/2017 Tumor Marker   Patient's tumor was tested for the following markers: CA-125 Results of the tumor marker test revealed 21.7   09/30/2017 Imaging   CT abdomen 1. New small clustered soft tissue nodules in the left lower quadrant in the sigmoid mesentery, largest 1.0 cm, which could represent recurrent peritoneal tumor implants. No ascites.  2. Midline high ventral abdominal wall hernia containing a portion of the transverse colon is mildly increased in size, and without bowel complication at this time. 3. Chronic findings include: Aortic Atherosclerosis (ICD10-I70.0). Diffuse hepatic steatosis. 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   10/11/2017 PET scan   1. Nodules in the sigmoid mesentery are hypermetabolic and highly worrisome for metastatic disease. 2. Attic steatosis.    11/03/2017 Procedure   Successful CT-guided  rectus abdominal muscle mass core biopsy. Path: - FOREIGN BODY GIANT CELL REACTION INVOLVING  FIBROADIPOSE TISSUE AND SKELETAL MUSCLE. - NO EVIDENCE OF MALIGNANCY.   11/04/2017 Cancer Staging   Staging form: Fallopian Tube, AJCC 7th Edition - Clinical: FIGO Stage IIIC, calculated as Stage IV (rT3, N0, M1) - Signed by [name], MD on 08/09/2019   02/2018 Imaging   CT: 1. Continued increase in size small peritoneal nodules along the mesenteric border of the proximal sigmoid colon as well as along the serosal surface of the proximal sigmoid colon. 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 2. No evidence of distant disease. 3. Stable large ventral hernia.   05/2018 Imaging   PET: 1. Redemonstration of hypermetabolic nodules within the sigmoid mesocolon. Mild response to therapy relative to CT of 02/24/2018. Mixed response to therapy compared to the most recent PET of 10/11/2017. 2. Hypermetabolism within the right pelvic rectus musculature, increased since the prior PET.  Clinical service requested comparison to the 11/24/2017 CT. Index 10 mm nodule within the sigmoid mesocolon was similar to the 02/24/2018 CT, and as described on that exam, increased from 7 mm on 11/24/2017. More inferior nodule within the mesocolon measures 12 mm today on image 156/4 and 8 mm on 11/24/2017.   05/2018 Imaging   CT: IMPRESSION: 1. Since 02/24/2018, decreased size of peritoneal nodules centered in the sigmoid mesocolon. 2. No evidence of new or progressive disease. 3. Hepatic steatosis. 4. Subcentimeter right renal angiomyolipoma, similar. 5.  Aortic Atherosclerosis (ICD10-I70.0). 6. Ventral abdominal wall laxity containing transverse colon, similar.   08/2018 Imaging   CT: IMPRESSION: 1. Nodules within the sigmoid mesocolon have decreased in size compared to prior. No new peritoneal or omental nodularity. 2. No evidence local recurrence the pelvis. 3. Ventral hernia contains a segment of transverse colon. No change from prior.     06/19/2019 Imaging   1. No  evidence of metastatic disease in the abdomen pelvis. 2. No peritoneal or omental metastasis identified.  No free fluid. 3. Postcholecystectomy and hysterectomy. Comparison exams are made available. Comparison CT 09/08/2018 and 05/27/2018. PET-CT 05/27/2018    There is a nodule within the proximal aspect of the sigmoid colon measuring 2.2 by 2.2 cm. In comparison to prior CTs and PET-CT there was a hypermetabolic nodule at this location on the PET-CT of 08/27/2017 and on the CT of 09/08/2018 there was a small residual nodule. At that time (09/08/2018) the nodule measured 1.4 by 1.3 cm. Therefore this nodule has increased in the interval and concerning for recurrence of a serosal implant. The previous described lymph nodes in the sigmoid mesocolon and more central mesentery are not increased in size and not pathologic by size criteria.    Concern for recurrence of serosal metastasis in the proximal sigmoid colon with a 2 cm enlarging lesion. Lesion extends into the lumen. No evidence of high-grade obstruction. Consider FDG PET scan and/or potential colonoscopy for evaluation.   06/29/2019 PET scan   1. Enlarging serosal implant within the proximal sigmoid colon has intense metabolic activity most consistent with malignancy. Lesion exhibits a somewhat indolent progression as present on PET-CT scan from 10/11/2017 and 05/27/2018. 2. Focal hypermetabolic activity within the RIGHT rectus muscle just off midline is slightly decreased from comparison exam. This may be benign inflammation related to prior laparotomy, however malignancy not excluded.   07/26/2019 Procedure   She had colonoscopy which showed multiple polyps.  6 polyps were removed from the cecum, measuring 3 to 6  mm in size.  One 12 mm polyp was removed from ascending colon and another 1 measures 7 mm.  One 5 mm polyp was removed from the transverse colon.  There is tumor noted in the sigmoid colon, approximately 35 cm from the anus which was  biopsied.  The tumor appeared to be fungating, infiltrative and ulcerated but nonobstructive.  It encompassed approximately one third of the circumference of the lumen.   07/26/2019 Pathology Results   Multiple polyps came back tubular adenomas.  2 ascending polyps came back sessile serrated adenoma months.  Sigmoid colon biopsy confirmed metastatic high-grade serous carcinoma.  The morphology and immunophenotype are consistent with metastatic high-grade serous carcinoma from tubal/ovarian primary site.   08/17/2019 - 12/12/2019 Chemotherapy   The patient had carboplatin and taxol   10/23/2019 Imaging   1. Sigmoid lesion with diminished size/conspicuity difficult to measure on the previous exam. Adjacent lymph nodes and nodularity less than a cm, largest on coronal image 48 measuring 5 mm. 2. Right rectus muscle with some thickening with mildly convex margin seen posteriorly on image 72 of series 2. This could be due to postsurgical change, however, metastatic disease is not excluded. Attention on follow-up is suggested. 3. No evidence for new metastatic disease. 4. Small hiatal hernia. 5. 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   Aortic Atherosclerosis (ICD10-I70.0).   01/01/2020 Imaging   1. No signs of new disease. 2. Tiny lymph nodes in the area of concern in the LEFT lower quadrant z. 3. Added density and subtle contour irregularity involving the rectus muscles best seen on sagittal images, associated with site of prior surgical incision just to the RIGHT of midline (image 72, series 2 and image 58, series 5. Not significantly changed compared to prior studies. This measures approximately 2.1 x 1 cm in the sagittal plane and is less well-defined in the axial plane. Potentially postoperative change. Attention on follow-up. 4. Aortic atherosclerosis.   Aortic Atherosclerosis (ICD10-I70.0).       01/29/2020 -  Chemotherapy   The patient had bevacizumab-bvzr (ZIRABEV) 1,300 mg in sodium  chloride 0.9 % 100 mL chemo infusion, 15 mg/kg = 1,300 mg, Intravenous,  Once, 7 of 10 cycles Administration: 1,300 mg (01/29/2020), 1,300 mg (03/27/2020), 1,300 mg (04/18/2020), 1,300 mg (02/19/2020), 1,300 mg (05/07/2020), 1,300 mg (05/29/2020)  for chemotherapy treatment.    05/06/2020 Imaging   1. Status post hysterectomy and oophorectomy. 2. No specific findings of recurrent or metastatic disease in the abdomen or pelvis. 3. No change in postoperative appearance of low midline incision. 4. Unchanged small prominent subcentimeter left iliac lymph nodes.     REVIEW OF SYSTEMS:   Constitutional: Denies fevers, chills or abnormal weight loss Eyes: Denies blurriness of vision Ears, nose, mouth, throat, and face: Denies mucositis or sore throat Respiratory: Denies cough, dyspnea or wheezes Cardiovascular: Denies palpitation, chest discomfort or lower extremity swelling Gastrointestinal:  Denies nausea, heartburn or change in bowel habits Skin: Denies abnormal skin rashes Lymphatics: Denies new lymphadenopathy or easy bruising Neurological:Denies numbness, tingling or new weaknesses Behavioral/Psych: Mood is stable, no new changes  All other systems were reviewed with the patient and are negative.  I have reviewed the past medical history, past surgical history, social history and family history with the patient and they are unchanged from previous note.  ALLERGIES:  is allergic to [employer] tartrate], bee venom, and cortisone.  MEDICATIONS:  Current Outpatient Medications  Medication Sig Dispense Refill  . amLODipine (NORVASC) 10 MG tablet Take  1 tablet (10 mg total) by mouth daily. 30 tablet 11  . Armodafinil 250 MG tablet Take 1 tablet (250 mg total) by mouth daily. 90 tablet 1  . busPIRone (BUSPAR) 7.5 MG tablet     . Carboxymethylcellulose Sodium (EYE DROPS OP) Apply 2 drops to eye daily as needed (dry eye).    . cholecalciferol (VITAMIN D3) 25 MCG (1000 UT) tablet Take 1,000  Units by mouth daily.    . colestipol (COLESTID) 1 g tablet Take 0.5-1 g by mouth daily. Pt taking 1/2 to 1 tablet daily depending on meal choice; taken 1 hour after morning meds , and can not eat for another hour    . EPINEPHrine (EPIPEN 2-PAK) 0.3 mg/0.3 mL DEVI Inject 0.3 mg into the muscle once as needed (For bee stings.). Reported on 01/20/2016    . eszopiclone (LUNESTA) 2 MG TABS tablet TAKE ONE TABLET BY MOUTH EVERYDAY AT BEDTIME 90 tablet 0  . FLUoxetine (PROZAC) 10 MG capsule Take 10 mg by mouth every morning. Takes with a 3m capsule for her total dose of 353m    . [place] KitchenLUoxetine (PROZAC) 20 MG capsule Take 20 mg by mouth every morning. Takes with a 1098mapsule for her total dose of 87m56m  . lidocaine-prilocaine (EMLA) cream Apply 1 application topically as needed. 30 g 1  . lisinopril (ZESTRIL) 20 MG tablet Take 1 tablet (20 mg total) by mouth daily. 30 tablet 11  . losartan-hydrochlorothiazide (HYZAAR) 100-12.5 MG tablet Take 1 tablet by mouth daily.     . MEMarland KitchenATONIN PO Take 10 mg by mouth at bedtime.     . metFORMIN (GLUCOPHAGE-XR) 500 MG 24 hr tablet Take 500 mg by mouth daily with breakfast.   3  . metoprolol tartrate (LOPRESSOR) 25 MG tablet Take 1 tablet (25 mg total) by mouth 2 (two) times daily. 60 tablet 3  . Multiple Vitamin (MULTIVITAMIN WITH MINERALS) TABS Take 1 tablet by mouth every morning.     . ondansetron (ZOFRAN) 8 MG tablet Take 1 tablet (8 mg total) by mouth every 8 (eight) hours as needed for refractory nausea / vomiting. Start on day 3 after carboplatin chemo. 30 tablet 1  . prochlorperazine (COMPAZINE) 10 MG tablet Take 1 tablet (10 mg total) by mouth every 6 (six) hours as needed (Nausea or vomiting). 30 tablet 1  . rosuvastatin (CRESTOR) 20 MG tablet Take 20 mg by mouth every morning.      No current facility-administered medications for this visit.   Facility-Administered Medications Ordered in Other Visits  Medication Dose Route Frequency Provider Last Rate  Last Admin  . bevacizumab-bvzr (ZIRABEV) 1,300 mg in sodium chloride 0.9 % 100 mL chemo infusion  15 mg/kg (Treatment Plan Recorded) Intravenous Once GorsAlvy Bimler, MD      . heparin lock flush 100 unit/mL  500 Units Intracatheter Once PRN GorsAlvy Bimler, MD      . sodium chloride flush (NS) 0.9 % injection 10 mL  10 mL Intravenous PRN GehrNancy Marus   10 mL at 05/12/17 0823  . sodium chloride flush (NS) 0.9 % injection 10 mL  10 mL Intracatheter PRN GorsAlvy Bimler, MD        PHYSICAL EXAMINATION: ECOG PERFORMANCE STATUS: 1 - Symptomatic but completely ambulatory  Vitals:   06/18/20 0912  BP: 140/80  Pulse: 70  Resp: 20  Temp: 98.1 F (36.7 C)  SpO2: 100%   Filed Weights   06/18/20 0912  Weight: 192 lb 6.4 oz (87.3  kg)    GENERAL:alert, no distress and comfortable SKIN: skin color, texture, turgor are normal, no rashes or significant lesions EYES: normal, Conjunctiva are pink and non-injected, sclera clear OROPHARYNX:no exudate, no erythema and lips, buccal mucosa, and tongue normal  NECK: supple, thyroid normal size, non-tender, without nodularity LYMPH:  no palpable lymphadenopathy in the cervical, axillary or inguinal LUNGS: clear to auscultation and percussion with normal breathing effort HEART: regular rate & rhythm and no murmurs and no lower extremity edema ABDOMEN:abdomen soft, non-tender and normal bowel sounds Musculoskeletal:no cyanosis of digits and no clubbing  NEURO: alert & oriented x 3 with fluent speech, no focal motor/sensory deficits  LABORATORY DATA:  I have reviewed the data as listed    Component Value Date/Time   NA 140 06/18/2020 0859   NA 141 12/02/2016 1219   K 3.8 06/18/2020 0859   K 3.7 12/02/2016 1219   CL 103 06/18/2020 0859   CL 103 01/18/2013 1329   CO2 27 06/18/2020 0859   CO2 27 12/02/2016 1219   GLUCOSE 101 (H) 06/18/2020 0859   GLUCOSE 98 12/02/2016 1219   GLUCOSE 110 (H) 01/18/2013 1329   BUN 13 06/18/2020 0859   BUN 15.3 12/02/2016  1219   CREATININE 0.71 06/18/2020 0859   CREATININE 0.7 12/02/2016 1219   CALCIUM 9.4 06/18/2020 0859   CALCIUM 10.1 12/02/2016 1219   PROT 6.9 06/18/2020 0859   PROT 7.1 12/02/2016 1219   ALBUMIN 4.0 06/18/2020 0859   ALBUMIN 4.2 12/02/2016 1219   AST 28 06/18/2020 0859   AST 21 12/02/2016 1219   ALT 34 06/18/2020 0859   ALT 27 12/02/2016 1219   ALKPHOS 75 06/18/2020 0859   ALKPHOS 104 12/02/2016 1219   BILITOT 0.4 06/18/2020 0859   BILITOT 0.37 12/02/2016 1219   GFRNONAA >60 06/18/2020 0859   GFRAA >60 05/07/2020 0833    No results found for: SPEP, UPEP  Lab Results  Component Value Date   WBC 4.8 06/18/2020   NEUTROABS 2.5 06/18/2020   HGB 13.3 06/18/2020   HCT 40.0 06/18/2020   MCV 91.7 06/18/2020   PLT 124 (L) 06/18/2020      Chemistry      Component Value Date/Time   NA 140 06/18/2020 0859   NA 141 12/02/2016 1219   K 3.8 06/18/2020 0859   K 3.7 12/02/2016 1219   CL 103 06/18/2020 0859   CL 103 01/18/2013 1329   CO2 27 06/18/2020 0859   CO2 27 12/02/2016 1219   BUN 13 06/18/2020 0859   BUN 15.3 12/02/2016 1219   CREATININE 0.71 06/18/2020 0859   CREATININE 0.7 12/02/2016 1219   GLU 236 12/13/2012 1558      Component Value Date/Time   CALCIUM 9.4 06/18/2020 0859   CALCIUM 10.1 12/02/2016 1219   ALKPHOS 75 06/18/2020 0859   ALKPHOS 104 12/02/2016 1219   AST 28 06/18/2020 0859   AST 21 12/02/2016 1219   ALT 34 06/18/2020 0859   ALT 27 12/02/2016 1219   BILITOT 0.4 06/18/2020 0859   BILITOT 0.37 12/02/2016 1219

## 2020-06-18 NOTE — Telephone Encounter (Signed)
Download looks great. She is using every night. On average 8hr and 25m. AHI is 2 which is great. No concerns from a sleep apnea standpoint that I can identify. I recommend talking with PCP or oncology if she feels anxiety Kristin Webb be triggering insomnia. TY.

## 2020-06-18 NOTE — Progress Notes (Signed)
[place] Note  Kristin Webb was bright-spirited and upbeat during our follow-up in infusion. She reports relief at ruling out worrisome potential reasons for her tiredness, as well as having done some significant grief-processing work (missing her husband, who helped her so much through a previous series of treatments). Having her dear friend and key supporter [name] accompany her to her appointment brought peace and comfort as well.  [name] plans to follow up with chaplain as needed/desired.   [place], [place], [facility] Pager [phone] Voicemail [phone]

## 2020-06-18 NOTE — Telephone Encounter (Signed)
-----   Message from [name], MD sent at 06/18/2020  9:16 AM EST ----- Regarding: can you call NS referral? I sent it last visit

## 2020-06-18 NOTE — Telephone Encounter (Signed)
Pt called, receiving care for cancer,on Avastin every 3 weeks. Having difficulty sleeping 10-12 hours a night, sleepy during the day. Check to if has something to do with sleep apnea.

## 2020-06-18 NOTE — Assessment & Plan Note (Signed)
Her last CT imaging showed no signs of cancer She has achieved complete response We discussed the role of maintenance therapy and she would like to continue I will see her back in 3 weeks for further follow-up

## 2020-06-18 NOTE — Telephone Encounter (Signed)
cpap down load printed placed on desk for viewing.

## 2020-06-18 NOTE — Assessment & Plan Note (Signed)
Her blood pressure control fluctuates up and down Per previous discussion, her primary care doctor will manage her blood pressure medications from now on

## 2020-06-18 NOTE — Assessment & Plan Note (Signed)
Her thrombocytopenia is stable Observe only for now 

## 2020-06-18 NOTE — Telephone Encounter (Signed)
Spoke to pt and relayed that cpap report looks great.  Not related to cpap/sleep apnea the fatigue, sleepiness that she is feeling.  Will check with pharmacist on her medications to see what may be causing sleepiness, (she states on 4 bp med due to avastin).  She appreciated call back.

## 2020-06-21 ENCOUNTER — Encounter: Payer: Self-pay | Admitting: Hematology and Oncology

## 2020-06-24 ENCOUNTER — Telehealth: Payer: Self-pay

## 2020-06-24 NOTE — Telephone Encounter (Signed)
Called and left a message on referral line to check on status of referral. Ask for a call back to the office.

## 2020-06-24 NOTE — Telephone Encounter (Signed)
[facility] called back. Dr. Vertell Limber is reviewing chart and then they will call with appt.

## 2020-06-24 NOTE — Telephone Encounter (Signed)
-----   Message from [name], MD sent at 06/24/2020  8:30 AM EST ----- Regarding: f/up on status of NS referral Can you call NS office to inquire status of the referral?

## 2020-06-28 DIAGNOSIS — E782 Mixed hyperlipidemia: Secondary | ICD-10-CM | POA: Diagnosis not present

## 2020-06-28 DIAGNOSIS — M159 Polyosteoarthritis, unspecified: Secondary | ICD-10-CM | POA: Diagnosis not present

## 2020-06-28 DIAGNOSIS — I10 Essential (primary) hypertension: Secondary | ICD-10-CM | POA: Diagnosis not present

## 2020-06-28 DIAGNOSIS — E1159 Type 2 diabetes mellitus with other circulatory complications: Secondary | ICD-10-CM | POA: Diagnosis not present

## 2020-07-01 DIAGNOSIS — Z0000 Encounter for general adult medical examination without abnormal findings: Secondary | ICD-10-CM | POA: Diagnosis not present

## 2020-07-01 DIAGNOSIS — Z1389 Encounter for screening for other disorder: Secondary | ICD-10-CM | POA: Diagnosis not present

## 2020-07-09 ENCOUNTER — Inpatient Hospital Stay (HOSPITAL_BASED_OUTPATIENT_CLINIC_OR_DEPARTMENT_OTHER): Payer: Medicare HMO | Admitting: Hematology and Oncology

## 2020-07-09 ENCOUNTER — Inpatient Hospital Stay: Payer: Medicare HMO

## 2020-07-09 ENCOUNTER — Encounter: Payer: Self-pay | Admitting: Hematology and Oncology

## 2020-07-09 ENCOUNTER — Other Ambulatory Visit: Payer: Self-pay

## 2020-07-09 VITALS — Temp 98.6°F

## 2020-07-09 DIAGNOSIS — M549 Dorsalgia, unspecified: Secondary | ICD-10-CM

## 2020-07-09 DIAGNOSIS — Z7189 Other specified counseling: Secondary | ICD-10-CM

## 2020-07-09 DIAGNOSIS — R42 Dizziness and giddiness: Secondary | ICD-10-CM | POA: Diagnosis not present

## 2020-07-09 DIAGNOSIS — Z5112 Encounter for antineoplastic immunotherapy: Secondary | ICD-10-CM | POA: Diagnosis not present

## 2020-07-09 DIAGNOSIS — I10 Essential (primary) hypertension: Secondary | ICD-10-CM

## 2020-07-09 DIAGNOSIS — D696 Thrombocytopenia, unspecified: Secondary | ICD-10-CM | POA: Diagnosis not present

## 2020-07-09 DIAGNOSIS — G8929 Other chronic pain: Secondary | ICD-10-CM

## 2020-07-09 DIAGNOSIS — Z90710 Acquired absence of both cervix and uterus: Secondary | ICD-10-CM | POA: Diagnosis not present

## 2020-07-09 DIAGNOSIS — C5700 Malignant neoplasm of unspecified fallopian tube: Secondary | ICD-10-CM

## 2020-07-09 DIAGNOSIS — C5701 Malignant neoplasm of right fallopian tube: Secondary | ICD-10-CM

## 2020-07-09 DIAGNOSIS — Z79899 Other long term (current) drug therapy: Secondary | ICD-10-CM | POA: Diagnosis not present

## 2020-07-09 DIAGNOSIS — Z7984 Long term (current) use of oral hypoglycemic drugs: Secondary | ICD-10-CM | POA: Diagnosis not present

## 2020-07-09 LAB — CBC WITH DIFFERENTIAL (CANCER CENTER ONLY)
Abs Immature Granulocytes: 0.01 10*3/uL (ref 0.00–0.07)
Basophils Absolute: 0 10*3/uL (ref 0.0–0.1)
Basophils Relative: 1 %
Eosinophils Absolute: 0.1 10*3/uL (ref 0.0–0.5)
Eosinophils Relative: 2 %
HCT: 39.4 % (ref 36.0–46.0)
Hemoglobin: 13.1 g/dL (ref 12.0–15.0)
Immature Granulocytes: 0 %
Lymphocytes Relative: 36 %
Lymphs Abs: 2.1 10*3/uL (ref 0.7–4.0)
MCH: 30.3 pg (ref 26.0–34.0)
MCHC: 33.2 g/dL (ref 30.0–36.0)
MCV: 91.2 fL (ref 80.0–100.0)
Monocytes Absolute: 0.6 10*3/uL (ref 0.1–1.0)
Monocytes Relative: 10 %
Neutro Abs: 3 10*3/uL (ref 1.7–7.7)
Neutrophils Relative %: 51 %
Platelet Count: 135 10*3/uL — ABNORMAL LOW (ref 150–400)
RBC: 4.32 MIL/uL (ref 3.87–5.11)
RDW: 12.8 % (ref 11.5–15.5)
WBC Count: 5.7 10*3/uL (ref 4.0–10.5)
nRBC: 0 % (ref 0.0–0.2)

## 2020-07-09 LAB — CMP (CANCER CENTER ONLY)
ALT: 38 U/L (ref 0–44)
AST: 25 U/L (ref 15–41)
Albumin: 3.9 g/dL (ref 3.5–5.0)
Alkaline Phosphatase: 82 U/L (ref 38–126)
Anion gap: 10 (ref 5–15)
BUN: 19 mg/dL (ref 8–23)
CO2: 26 mmol/L (ref 22–32)
Calcium: 9.9 mg/dL (ref 8.9–10.3)
Chloride: 103 mmol/L (ref 98–111)
Creatinine: 0.71 mg/dL (ref 0.44–1.00)
GFR, Estimated: >60 mL/min (ref >60)
Glucose, Bld: 105 mg/dL — ABNORMAL HIGH (ref 70–99)
Potassium: 4 mmol/L (ref 3.5–5.1)
Sodium: 139 mmol/L (ref 135–145)
Total Bilirubin: 0.4 mg/dL (ref 0.3–1.2)
Total Protein: 7 g/dL (ref 6.5–8.1)

## 2020-07-09 LAB — TOTAL PROTEIN, URINE DIPSTICK: Protein, ur: NEGATIVE mg/dL

## 2020-07-09 MED ORDER — SODIUM CHLORIDE 0.9% FLUSH
10.0000 mL | Freq: Once | INTRAVENOUS | Status: AC
  Administered 2020-07-09: 10 mL
  Filled 2020-07-09: qty 10

## 2020-07-09 MED ORDER — SODIUM CHLORIDE 0.9% FLUSH
10.0000 mL | INTRAVENOUS | Status: DC | PRN
  Administered 2020-07-09: 10 mL
  Filled 2020-07-09: qty 10

## 2020-07-09 MED ORDER — SODIUM CHLORIDE 0.9 % IV SOLN
15.0000 mg/kg | Freq: Once | INTRAVENOUS | Status: AC
  Administered 2020-07-09: 1300 mg via INTRAVENOUS
  Filled 2020-07-09: qty 4

## 2020-07-09 MED ORDER — HEPARIN SOD (PORK) LOCK FLUSH 100 UNIT/ML IV SOLN
500.0000 [IU] | Freq: Once | INTRAVENOUS | Status: AC | PRN
  Administered 2020-07-09: 500 [IU]
  Filled 2020-07-09: qty 5

## 2020-07-09 MED ORDER — SODIUM CHLORIDE 0.9 % IV SOLN
Freq: Once | INTRAVENOUS | Status: AC
  Filled 2020-07-09: qty 250

## 2020-07-09 NOTE — Assessment & Plan Note (Signed)
Her blood pressure control fluctuates up and down Per previous discussion, her primary care doctor will manage her blood pressure medications from now on

## 2020-07-09 NOTE — Assessment & Plan Note (Signed)
Her last CT imaging showed no signs of cancer She has achieved complete response We discussed the role of maintenance therapy and she would like to continue I will see her back in 3 weeks for further follow-up

## 2020-07-09 NOTE — Assessment & Plan Note (Signed)
I have attempted to refer her to see a neurosurgeon for the last 2 months but unable to do so I recommend she contact her primary care doctor for referral

## 2020-07-09 NOTE — Patient Instructions (Signed)
[facility] Discharge Instructions for Patients Receiving Chemotherapy  Today you received the following chemotherapy agent: Bevacizumab  To help prevent nausea and vomiting after your treatment, we encourage you to take your nausea medication as directed  If you develop nausea and vomiting that is not controlled by your nausea medication, call the clinic.   BELOW ARE SYMPTOMS THAT SHOULD BE REPORTED IMMEDIATELY:  *FEVER GREATER THAN 100.5 F  *CHILLS WITH OR WITHOUT FEVER  NAUSEA AND VOMITING THAT IS NOT CONTROLLED WITH YOUR NAUSEA MEDICATION  *UNUSUAL SHORTNESS OF BREATH  *UNUSUAL BRUISING OR BLEEDING  TENDERNESS IN MOUTH AND THROAT WITH OR WITHOUT PRESENCE OF ULCERS  *URINARY PROBLEMS  *BOWEL PROBLEMS  UNUSUAL RASH Items with * indicate a potential emergency and should be followed up as soon as possible.  Feel free to call the clinic should you have any questions or concerns. The clinic phone number is [phone].  Please show the [place] at check-in to the Emergency Department and triage nurse.

## 2020-07-09 NOTE — Progress Notes (Signed)
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 showed no signs of cancer She has achieved complete response We discussed the role of maintenance therapy and she would like to continue I will see her back in 3 weeks for further follow-up  Essential hypertension Her blood pressure control fluctuates up and down Per previous discussion, her primary care doctor will manage her blood pressure medications from now on  Thrombocytopenia ([place]) Her thrombocytopenia is stable Observe only for now  Chronic back pain greater than 3 months duration I have attempted to refer her to see a neurosurgeon for the last 2 months but unable to do so I recommend she contact her primary care doctor for referral   No orders of the defined types were placed in this encounter.   All questions were answered. The patient knows to call the clinic with any problems, questions or concerns. The total time spent in the appointment was 20 minutes encounter with patients including review of chart and various tests results, discussions about plan of care and coordination of care plan   [name], MD 07/09/2020 9:01 AM  INTERVAL HISTORY: Please see below for problem oriented charting. She returns for further follow-up Her blood pressure control at home is satisfactory She continues to have postural dizziness but no recent falls She has made some adjustment to her medications and sleep pattern and has been pushing herself a little bit more to combat fatigue She used to have normal bowel habits but since discontinuation of chemotherapy, had slight diarrhea  SUMMARY OF ONCOLOGIC HISTORY: Oncology History Overview Note  Oncologic Summary: 1. 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 Random CA 125 elevation January 2019  Question mesenteric nodules and anterior abdominal wall nodule 3. GeneDx Breast/Ovary Panel negative (including BRCA, MMR's, RAD51 etc) ? 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  Interval development of large mid abdominal mass highly concerning for right ovarian cancer.  There is mild omental nodularity on the left, and peritoneal disease cannot be completely excluded.  There is no ascites or other evidence of metastatic disease. 2.  Mild associated renal pelvocaliectasis bilaterally without obstruction.  Nonobstructing left renal calculus and a small right renal angiomyolipoma noted incidentally.   11/08/2012 Pathology Results   1. 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 - BENIGN FALLOPIAN TUBE WITH NONSPECIFIC CHRONIC INFLAMMATION. 2. Ovary and fallopian tube, left - BENIGN OVARY; NEGATIVE FOR ATYPIA OR MALIGNANCY. - BENIGN FALLOPIAN TUBE; NEGATIVE FOR ATYPIA OR MALIGNANCY. 3. Omentum, resection for tumor - HIGH GRADE CARCINOMA, SEE COMMENT. 4. Appendix, Other than Incidental - FIBROUS OBLITERATION OF APPENDICEAL TIP. - NEGATIVE FOR MALIGNANCY.   11/08/2012 Surgery   Surgery: Exploratory laparotomy, bilateral salpingo-oophorectomy, appendectomy, infacolic omentectomy, optimal debulking  Surgeons:  Paola A. Alycia Rossetti, MD; Lahoma Crocker, MD   Assistant: Caswell Corwin  Pathology: Bilateral fallopian tubes and ovaries to pathology. Appendix as well as omentum. Frozen section  of the right ovary  revealed at least a mucinous low malignant potential or borderline tumor of the ovary.  Operative findings: 25 cm right adnexal mass with smooth surface. Surgically absent uterus. Atrophic-appearing left ovary. Normal appearing appendix. Within the omentum there were centimeter nodules scattered throughout the omentum. The remainder of the surfaces were benign.   12/08/2012 Procedure   Impression:  Placement of a subcutaneous port device.  The catheter tip is in the lower SVC and ready to be used.    12/13/2012 - 03/28/2013 Chemotherapy   s/p 6 cycles of paclitaxel and carboplatin   12/13/2012 - 03/28/2013 Chemotherapy   The patient had 6 cycles of carboplatin and Taxol   03/24/2013 Imaging   US abdomen   04/24/2013 Imaging   CT abdomen Interval resection of the large right pelvic and lower abdominal mass lesion with apparent omentectomy.  No evidence for intraperitoneal free fluid on today's study.  No discernible peritoneal lesions.  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   09/07/2013 Genetic Testing   Patient has genetic testing done for BRCA1/2 panel Results revealed patient has no mutation(s):   07/09/2015 Imaging   CT abdomen 1. 12 mm obstructive calculus at the left ureteropelvic junction with moderate proximal hydronephrosis. 2. 2 small supraumbilical ventral hernias, one containing a short segment of the mid transverse colon and the other containing a short segment of the mid small bowel. There is no associated evidence to suggest bowel incarceration or obstruction at this time. 3. Tiny locule of gas non dependently in the lumen of the urinary bladder. This is presumably iatrogenic related to recent catheterization for urinalysis. Alternatively, this could be seen in the setting of urinary tract infection with gas-forming organisms. Clinical correlation for history of recent catheterization is recommended. 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 5.  Status post cholecystectomy. 6. Additional incidental findings, as above.    12/11/2016 Imaging   Ct abdomen 1. No evidence of metastatic ovarian cancer. 2. Recurrent subxiphoid ventral abdominal wall hernia containing transverse colon. No evidence of incarceration or obstruction. 3. Stable incidental findings in the liver and kidneys. No recurrent urinary tract calculus. 4. Progressive lower lumbar spondylosis. 5.  Aortic Atherosclerosis (ICD10-I70.0).    08/30/2017 Imaging   MRI thoracic spine 1. At T5-6 there is a small central disc protrusion contacting the ventral thoracic spinal cord. No central canal or foraminal stenosis. 2. At T9-10 there is a small right paracentral disc protrusion. 3.  No acute osseous injury of the thoracic spine. 4. No aggressive osseous lesion to suggest metastatic disease.   09/24/2017 Tumor Marker   Patient's tumor was tested for the following markers: CA-125 Results of the tumor marker test revealed 21.7   09/30/2017 Imaging   CT abdomen 1. New small clustered soft tissue nodules in the left lower quadrant in the sigmoid mesentery, largest 1.0 cm, which could represent recurrent peritoneal tumor implants. No ascites.  2. Midline high ventral abdominal wall hernia containing a portion of the transverse colon is mildly increased in size, and without bowel complication at this time. 3. Chronic findings include: Aortic Atherosclerosis (ICD10-I70.0). Diffuse hepatic steatosis. 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   10/11/2017 PET scan   1. Nodules in the sigmoid mesentery are hypermetabolic and highly worrisome for metastatic disease. 2. Attic steatosis.    11/03/2017 Procedure   Successful CT-guided rectus abdominal muscle mass core biopsy. Path: - FOREIGN BODY GIANT CELL REACTION  INVOLVING FIBROADIPOSE TISSUE AND SKELETAL MUSCLE. - NO EVIDENCE OF MALIGNANCY.   11/04/2017 Cancer Staging    Staging form: Fallopian Tube, AJCC 7th Edition - Clinical: FIGO Stage IIIC, calculated as Stage IV (rT3, N0, M1) - Signed by [name], MD on 08/09/2019   02/2018 Imaging   CT: 1. Continued increase in size small peritoneal nodules along the mesenteric border of the proximal sigmoid colon as well as along the serosal surface of the proximal sigmoid colon. 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 2. No evidence of distant disease. 3. Stable large ventral hernia.   05/2018 Imaging   PET: 1. Redemonstration of hypermetabolic nodules within the sigmoid mesocolon. Mild response to therapy relative to CT of 02/24/2018. Mixed response to therapy compared to the most recent PET of 10/11/2017. 2. Hypermetabolism within the right pelvic rectus musculature, increased since the prior PET.  Clinical service requested comparison to the 11/24/2017 CT. Index 10 mm nodule within the sigmoid mesocolon was similar to the 02/24/2018 CT, and as described on that exam, increased from 7 mm on 11/24/2017. More inferior nodule within the mesocolon measures 12 mm today on image 156/4 and 8 mm on 11/24/2017.   05/2018 Imaging   CT: IMPRESSION: 1. Since 02/24/2018, decreased size of peritoneal nodules centered in the sigmoid mesocolon. 2. No evidence of new or progressive disease. 3. Hepatic steatosis. 4. Subcentimeter right renal angiomyolipoma, similar. 5.  Aortic Atherosclerosis (ICD10-I70.0). 6. Ventral abdominal wall laxity containing transverse colon, similar.   08/2018 Imaging   CT: IMPRESSION: 1. Nodules within the sigmoid mesocolon have decreased in size compared to prior. No new peritoneal or omental nodularity. 2. No evidence local recurrence the pelvis. 3. Ventral hernia contains a segment of transverse colon. No change from prior.     06/19/2019 Imaging   1. No evidence of metastatic disease in the abdomen pelvis. 2. No peritoneal or omental metastasis  identified.  No free fluid. 3. Postcholecystectomy and hysterectomy. Comparison exams are made available. Comparison CT 09/08/2018 and 05/27/2018. PET-CT 05/27/2018    There is a nodule within the proximal aspect of the sigmoid colon measuring 2.2 by 2.2 cm. In comparison to prior CTs and PET-CT there was a hypermetabolic nodule at this location on the PET-CT of 08/27/2017 and on the CT of 09/08/2018 there was a small residual nodule. At that time (09/08/2018) the nodule measured 1.4 by 1.3 cm. Therefore this nodule has increased in the interval and concerning for recurrence of a serosal implant. The previous described lymph nodes in the sigmoid mesocolon and more central mesentery are not increased in size and not pathologic by size criteria.    Concern for recurrence of serosal metastasis in the proximal sigmoid colon with a 2 cm enlarging lesion. Lesion extends into the lumen. No evidence of high-grade obstruction. Consider FDG PET scan and/or potential colonoscopy for evaluation.   06/29/2019 PET scan   1. Enlarging serosal implant within the proximal sigmoid colon has intense metabolic activity most consistent with malignancy. Lesion exhibits a somewhat indolent progression as present on PET-CT scan from 10/11/2017 and 05/27/2018. 2. Focal hypermetabolic activity within the RIGHT rectus muscle just off midline is slightly decreased from comparison exam. This may be benign inflammation related to prior laparotomy, however malignancy not excluded.   07/26/2019 Procedure   She had colonoscopy which showed multiple polyps.  6 polyps were removed from the cecum, measuring 3 to 6 mm in size.  One 12 mm polyp was removed from ascending colon  and another 1 measures 7 mm.  One 5 mm polyp was removed from the transverse colon.  There is tumor noted in the sigmoid colon, approximately 35 cm from the anus which was biopsied.  The tumor appeared to be fungating, infiltrative and ulcerated but nonobstructive.   It encompassed approximately one third of the circumference of the lumen.   07/26/2019 Pathology Results   Multiple polyps came back tubular adenomas.  2 ascending polyps came back sessile serrated adenoma months.  Sigmoid colon biopsy confirmed metastatic high-grade serous carcinoma.  The morphology and immunophenotype are consistent with metastatic high-grade serous carcinoma from tubal/ovarian primary site.   08/17/2019 - 12/12/2019 Chemotherapy   The patient had carboplatin and taxol   10/23/2019 Imaging   1. Sigmoid lesion with diminished size/conspicuity difficult to measure on the previous exam. Adjacent lymph nodes and nodularity less than a cm, largest on coronal image 48 measuring 5 mm. 2. Right rectus muscle with some thickening with mildly convex margin seen posteriorly on image 72 of series 2. This could be due to postsurgical change, however, metastatic disease is not excluded. Attention on follow-up is suggested. 3. No evidence for new metastatic disease. 4. Small hiatal hernia. 5. 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   Aortic Atherosclerosis (ICD10-I70.0).   01/01/2020 Imaging   1. No signs of new disease. 2. Tiny lymph nodes in the area of concern in the LEFT lower quadrant z. 3. Added density and subtle contour irregularity involving the rectus muscles best seen on sagittal images, associated with site of prior surgical incision just to the RIGHT of midline (image 72, series 2 and image 58, series 5. Not significantly changed compared to prior studies. This measures approximately 2.1 x 1 cm in the sagittal plane and is less well-defined in the axial plane. Potentially postoperative change. Attention on follow-up. 4. Aortic atherosclerosis.   Aortic Atherosclerosis (ICD10-I70.0).       01/29/2020 -  Chemotherapy   The patient had bevacizumab-bvzr (ZIRABEV) 1,300 mg in sodium chloride 0.9 % 100 mL chemo infusion, 15 mg/kg = 1,300 mg, Intravenous,  Once, 7 of 10  cycles Administration: 1,300 mg (01/29/2020), 1,300 mg (03/27/2020), 1,300 mg (04/18/2020), 1,300 mg (02/19/2020), 1,300 mg (05/07/2020), 1,300 mg (05/29/2020), 1,300 mg (06/18/2020)  for chemotherapy treatment.    05/06/2020 Imaging   1. Status post hysterectomy and oophorectomy. 2. No specific findings of recurrent or metastatic disease in the abdomen or pelvis. 3. No change in postoperative appearance of low midline incision. 4. Unchanged small prominent subcentimeter left iliac lymph nodes.     REVIEW OF SYSTEMS:   Constitutional: Denies fevers, chills or abnormal weight loss Eyes: Denies blurriness of vision Ears, nose, mouth, throat, and face: Denies mucositis or sore throat Respiratory: Denies cough, dyspnea or wheezes Cardiovascular: Denies palpitation, chest discomfort or lower extremity swelling Skin: Denies abnormal skin rashes Lymphatics: Denies new lymphadenopathy or easy bruising Neurological:Denies numbness, tingling or new weaknesses Behavioral/Psych: Mood is stable, no new changes  All other systems were reviewed with the patient and are negative.  I have reviewed the past medical history, past surgical history, social history and family history with the patient and they are unchanged from previous note.  ALLERGIES:  is allergic to [employer] tartrate] and cortisone.  MEDICATIONS:  Current Outpatient Medications  Medication Sig Dispense Refill  . amLODipine (NORVASC) 10 MG tablet Take 1 tablet (10 mg total) by mouth daily. 30 tablet 11  . Armodafinil 250 MG tablet Take 1 tablet (250 mg  total) by mouth daily. 90 tablet 1  . busPIRone (BUSPAR) 7.5 MG tablet     . chlorthalidone (HYGROTON) 25 MG tablet Take 25 mg by mouth daily.    . cholecalciferol (VITAMIN D3) 25 MCG (1000 UT) tablet Take 1,000 Units by mouth daily.    . colestipol (COLESTID) 1 g tablet Take 0.5-1 g by mouth daily. Pt taking 1/2 to 1 tablet daily depending on meal choice; taken 1 hour after morning  meds , and can not eat for another hour    . eszopiclone (LUNESTA) 2 MG TABS tablet TAKE ONE TABLET BY MOUTH EVERYDAY AT BEDTIME 90 tablet 0  . FLUoxetine (PROZAC) 10 MG capsule Take 10 mg by mouth every morning. Takes with a 84m capsule for her total dose of 341m    . Marland KitchenLUoxetine (PROZAC) 20 MG capsule Take 20 mg by mouth every morning. Takes with a 1054mapsule for her total dose of 19m40m  . lidocaine-prilocaine (EMLA) cream Apply 1 application topically as needed. 30 g 1  . MELATONIN PO Take 10 mg by mouth at bedtime.     . metFORMIN (GLUCOPHAGE-XR) 500 MG 24 hr tablet Take 500 mg by mouth daily with breakfast.   3  . metoprolol tartrate (LOPRESSOR) 25 MG tablet Take 1 tablet (25 mg total) by mouth 2 (two) times daily. 60 tablet 3  . Multiple Vitamin (MULTIVITAMIN WITH MINERALS) TABS Take 1 tablet by mouth every morning.     . prochlorperazine (COMPAZINE) 10 MG tablet Take 1 tablet (10 mg total) by mouth every 6 (six) hours as needed (Nausea or vomiting). 30 tablet 1  . rosuvastatin (CRESTOR) 20 MG tablet Take 20 mg by mouth every morning.     . teMarland Kitchenmisartan (MICARDIS) 40 MG tablet Take 40 mg by mouth daily.    . ondansetron (ZOFRAN) 8 MG tablet Take 1 tablet (8 mg total) by mouth every 8 (eight) hours as needed for refractory nausea / vomiting. Start on day 3 after carboplatin chemo. 30 tablet 1   No current facility-administered medications for this visit.   Facility-Administered Medications Ordered in Other Visits  Medication Dose Route Frequency Provider Last Rate Last Admin  . sodium chloride flush (NS) 0.9 % injection 10 mL  10 mL Intravenous PRN GehrNancy Marus   10 mL at 05/12/17 [id number] EXAMINATION: ECOG PERFORMANCE STATUS: 1 - Symptomatic but completely ambulatory  Vitals:   07/09/20 0818  BP: 138/80  Pulse: 70  Resp: 18  SpO2: 100%   Filed Weights   07/09/20 0818  Weight: 191 lb 6.4 oz (86.8 kg)    GENERAL:alert, no distress and comfortable SKIN: skin  color, texture, turgor are normal, no rashes or significant lesions EYES: normal, Conjunctiva are pink and non-injected, sclera clear OROPHARYNX:no exudate, no erythema and lips, buccal mucosa, and tongue normal  NECK: supple, thyroid normal size, non-tender, without nodularity LYMPH:  no palpable lymphadenopathy in the cervical, axillary or inguinal LUNGS: clear to auscultation and percussion with normal breathing effort HEART: regular rate & rhythm and no murmurs and no lower extremity edema ABDOMEN:abdomen soft, non-tender and normal bowel sounds Musculoskeletal:no cyanosis of digits and no clubbing  NEURO: alert & oriented x 3 with fluent speech, no focal motor/sensory deficits  LABORATORY DATA:  I have reviewed the data as listed    Component Value Date/Time   NA 139 07/09/2020 0750   NA 141 12/02/2016 1219   K 4.0 07/09/2020 0750   K 3.7  12/02/2016 1219   CL 103 07/09/2020 0750   CL 103 01/18/2013 1329   CO2 26 07/09/2020 0750   CO2 27 12/02/2016 1219   GLUCOSE 105 (H) 07/09/2020 0750   GLUCOSE 98 12/02/2016 1219   GLUCOSE 110 (H) 01/18/2013 1329   BUN 19 07/09/2020 0750   BUN 15.3 12/02/2016 1219   CREATININE 0.71 07/09/2020 0750   CREATININE 0.7 12/02/2016 1219   CALCIUM 9.9 07/09/2020 0750   CALCIUM 10.1 12/02/2016 1219   PROT 7.0 07/09/2020 0750   PROT 7.1 12/02/2016 1219   ALBUMIN 3.9 07/09/2020 0750   ALBUMIN 4.2 12/02/2016 1219   AST 25 07/09/2020 0750   AST 21 12/02/2016 1219   ALT 38 07/09/2020 0750   ALT 27 12/02/2016 1219   ALKPHOS 82 07/09/2020 0750   ALKPHOS 104 12/02/2016 1219   BILITOT 0.4 07/09/2020 0750   BILITOT 0.37 12/02/2016 1219   GFRNONAA >60 07/09/2020 0750   GFRAA >60 05/07/2020 0833    No results found for: SPEP, UPEP  Lab Results  Component Value Date   WBC 5.7 07/09/2020   NEUTROABS 3.0 07/09/2020   HGB 13.1 07/09/2020   HCT 39.4 07/09/2020   MCV 91.2 07/09/2020   PLT 135 (L) 07/09/2020      Chemistry      Component Value  Date/Time   NA 139 07/09/2020 0750   NA 141 12/02/2016 1219   K 4.0 07/09/2020 0750   K 3.7 12/02/2016 1219   CL 103 07/09/2020 0750   CL 103 01/18/2013 1329   CO2 26 07/09/2020 0750   CO2 27 12/02/2016 1219   BUN 19 07/09/2020 0750   BUN 15.3 12/02/2016 1219   CREATININE 0.71 07/09/2020 0750   CREATININE 0.7 12/02/2016 1219   GLU 236 12/13/2012 1558      Component Value Date/Time   CALCIUM 9.9 07/09/2020 0750   CALCIUM 10.1 12/02/2016 1219   ALKPHOS 82 07/09/2020 0750   ALKPHOS 104 12/02/2016 1219   AST 25 07/09/2020 0750   AST 21 12/02/2016 1219   ALT 38 07/09/2020 0750   ALT 27 12/02/2016 1219   BILITOT 0.4 07/09/2020 0750   BILITOT 0.37 12/02/2016 1219

## 2020-07-09 NOTE — Assessment & Plan Note (Signed)
Her thrombocytopenia is stable Observe only for now 

## 2020-07-22 DIAGNOSIS — M5414 Radiculopathy, thoracic region: Secondary | ICD-10-CM | POA: Diagnosis not present

## 2020-07-22 DIAGNOSIS — R030 Elevated blood-pressure reading, without diagnosis of hypertension: Secondary | ICD-10-CM | POA: Diagnosis not present

## 2020-07-22 DIAGNOSIS — Z6838 Body mass index (BMI) 38.0-38.9, adult: Secondary | ICD-10-CM | POA: Diagnosis not present

## 2020-07-23 ENCOUNTER — Other Ambulatory Visit (HOSPITAL_COMMUNITY): Payer: Self-pay | Admitting: Neurosurgery

## 2020-07-23 ENCOUNTER — Other Ambulatory Visit: Payer: Self-pay | Admitting: Neurosurgery

## 2020-07-23 DIAGNOSIS — M5414 Radiculopathy, thoracic region: Secondary | ICD-10-CM

## 2020-07-24 ENCOUNTER — Other Ambulatory Visit: Payer: Self-pay | Admitting: *Deleted

## 2020-07-24 DIAGNOSIS — Z9989 Dependence on other enabling machines and devices: Secondary | ICD-10-CM

## 2020-07-24 DIAGNOSIS — F5102 Adjustment insomnia: Secondary | ICD-10-CM

## 2020-07-24 DIAGNOSIS — G4733 Obstructive sleep apnea (adult) (pediatric): Secondary | ICD-10-CM

## 2020-07-24 MED ORDER — ESZOPICLONE 2 MG PO TABS: ORAL_TABLET | ORAL | 0 refills | Status: DC

## 2020-07-30 ENCOUNTER — Inpatient Hospital Stay: Payer: Medicare HMO | Attending: Gynecologic Oncology

## 2020-07-30 ENCOUNTER — Inpatient Hospital Stay: Payer: Medicare HMO

## 2020-07-30 ENCOUNTER — Inpatient Hospital Stay (HOSPITAL_BASED_OUTPATIENT_CLINIC_OR_DEPARTMENT_OTHER): Payer: Medicare HMO | Admitting: Hematology and Oncology

## 2020-07-30 ENCOUNTER — Encounter: Payer: Self-pay | Admitting: Hematology and Oncology

## 2020-07-30 ENCOUNTER — Other Ambulatory Visit: Payer: Self-pay

## 2020-07-30 ENCOUNTER — Encounter: Payer: Self-pay | Admitting: Licensed Clinical Social Worker

## 2020-07-30 VITALS — BP 136/80 | HR 62 | Temp 97.0°F | Resp 16 | Ht 62.0 in | Wt 195.0 lb

## 2020-07-30 DIAGNOSIS — G8929 Other chronic pain: Secondary | ICD-10-CM

## 2020-07-30 DIAGNOSIS — Z5112 Encounter for antineoplastic immunotherapy: Secondary | ICD-10-CM | POA: Diagnosis not present

## 2020-07-30 DIAGNOSIS — Z7189 Other specified counseling: Secondary | ICD-10-CM

## 2020-07-30 DIAGNOSIS — C5701 Malignant neoplasm of right fallopian tube: Secondary | ICD-10-CM

## 2020-07-30 DIAGNOSIS — I10 Essential (primary) hypertension: Secondary | ICD-10-CM

## 2020-07-30 DIAGNOSIS — M549 Dorsalgia, unspecified: Secondary | ICD-10-CM | POA: Diagnosis not present

## 2020-07-30 DIAGNOSIS — C5700 Malignant neoplasm of unspecified fallopian tube: Secondary | ICD-10-CM | POA: Diagnosis not present

## 2020-07-30 DIAGNOSIS — D696 Thrombocytopenia, unspecified: Secondary | ICD-10-CM

## 2020-07-30 LAB — CBC WITH DIFFERENTIAL (CANCER CENTER ONLY)
Abs Immature Granulocytes: 0.01 10*3/uL (ref 0.00–0.07)
Basophils Absolute: 0 10*3/uL (ref 0.0–0.1)
Basophils Relative: 0 %
Eosinophils Absolute: 0.1 10*3/uL (ref 0.0–0.5)
Eosinophils Relative: 2 %
HCT: 39.8 % (ref 36.0–46.0)
Hemoglobin: 13.3 g/dL (ref 12.0–15.0)
Immature Granulocytes: 0 %
Lymphocytes Relative: 33 %
Lymphs Abs: 1.7 10*3/uL (ref 0.7–4.0)
MCH: 30.7 pg (ref 26.0–34.0)
MCHC: 33.4 g/dL (ref 30.0–36.0)
MCV: 91.9 fL (ref 80.0–100.0)
Monocytes Absolute: 0.5 10*3/uL (ref 0.1–1.0)
Monocytes Relative: 10 %
Neutro Abs: 2.7 10*3/uL (ref 1.7–7.7)
Neutrophils Relative %: 55 %
Platelet Count: 125 10*3/uL — ABNORMAL LOW (ref 150–400)
RBC: 4.33 MIL/uL (ref 3.87–5.11)
RDW: 12.9 % (ref 11.5–15.5)
WBC Count: 5 10*3/uL (ref 4.0–10.5)
nRBC: 0 % (ref 0.0–0.2)

## 2020-07-30 LAB — CMP (CANCER CENTER ONLY)
ALT: 37 U/L (ref 0–44)
AST: 29 U/L (ref 15–41)
Albumin: 4 g/dL (ref 3.5–5.0)
Alkaline Phosphatase: 74 U/L (ref 38–126)
Anion gap: 9 (ref 5–15)
BUN: 14 mg/dL (ref 8–23)
CO2: 27 mmol/L (ref 22–32)
Calcium: 9.4 mg/dL (ref 8.9–10.3)
Chloride: 104 mmol/L (ref 98–111)
Creatinine: 0.73 mg/dL (ref 0.44–1.00)
GFR, Estimated: >60 mL/min (ref >60)
Glucose, Bld: 108 mg/dL — ABNORMAL HIGH (ref 70–99)
Potassium: 3.8 mmol/L (ref 3.5–5.1)
Sodium: 140 mmol/L (ref 135–145)
Total Bilirubin: 0.5 mg/dL (ref 0.3–1.2)
Total Protein: 7.1 g/dL (ref 6.5–8.1)

## 2020-07-30 LAB — TOTAL PROTEIN, URINE DIPSTICK: Protein, ur: NEGATIVE mg/dL

## 2020-07-30 MED ORDER — SODIUM CHLORIDE 0.9% FLUSH
10.0000 mL | INTRAVENOUS | Status: DC | PRN
  Administered 2020-07-30: 10 mL
  Filled 2020-07-30: qty 10

## 2020-07-30 MED ORDER — SODIUM CHLORIDE 0.9 % IV SOLN
Freq: Once | INTRAVENOUS | Status: AC
  Filled 2020-07-30: qty 250

## 2020-07-30 MED ORDER — SODIUM CHLORIDE 0.9% FLUSH
10.0000 mL | Freq: Once | INTRAVENOUS | Status: AC
  Administered 2020-07-30: 10 mL
  Filled 2020-07-30: qty 10

## 2020-07-30 MED ORDER — HEPARIN SOD (PORK) LOCK FLUSH 100 UNIT/ML IV SOLN
500.0000 [IU] | Freq: Once | INTRAVENOUS | Status: AC | PRN
  Administered 2020-07-30: 500 [IU]
  Filled 2020-07-30: qty 5

## 2020-07-30 MED ORDER — SODIUM CHLORIDE 0.9 % IV SOLN
15.0000 mg/kg | Freq: Once | INTRAVENOUS | Status: AC
  Administered 2020-07-30: 1300 mg via INTRAVENOUS
  Filled 2020-07-30: qty 48

## 2020-07-30 NOTE — Assessment & Plan Note (Signed)
She was seen for evaluation of back pain MRI has been ordered for further evaluation I would defer to her orthopedic surgeon for management

## 2020-07-30 NOTE — Patient Instructions (Signed)
[facility] Discharge Instructions for Patients Receiving Chemotherapy  Today you received the following chemotherapy agents: bevacizumab  To help prevent nausea and vomiting after your treatment, we encourage you to take your nausea medication as directed.   If you develop nausea and vomiting that is not controlled by your nausea medication, call the clinic.   BELOW ARE SYMPTOMS THAT SHOULD BE REPORTED IMMEDIATELY:  *FEVER GREATER THAN 100.5 F  *CHILLS WITH OR WITHOUT FEVER  NAUSEA AND VOMITING THAT IS NOT CONTROLLED WITH YOUR NAUSEA MEDICATION  *UNUSUAL SHORTNESS OF BREATH  *UNUSUAL BRUISING OR BLEEDING  TENDERNESS IN MOUTH AND THROAT WITH OR WITHOUT PRESENCE OF ULCERS  *URINARY PROBLEMS  *BOWEL PROBLEMS  UNUSUAL RASH Items with * indicate a potential emergency and should be followed up as soon as possible.  Feel free to call the clinic should you have any questions or concerns. The clinic phone number is [phone].  Please show the CHEMO ALERT CARD at check-in to the Emergency Department and triage nurse.   

## 2020-07-30 NOTE — Assessment & Plan Note (Signed)
Her blood pressure control fluctuates up and down Per previous discussion, her primary care doctor will manage her blood pressure medications from now on 

## 2020-07-30 NOTE — Assessment & Plan Note (Signed)
Her thrombocytopenia is stable Observe only for now 

## 2020-07-30 NOTE — Progress Notes (Signed)
[place] Psychosocial Distress Screening Clinical Social Work  Clinical Social Work was referred by distress screening protocol.  The patient scored a 6 on the Psychosocial Distress Thermometer which indicates moderate distress.   Patient declined social work referral. [facility] may be consulted as needed in the future.  ONCBCN DISTRESS SCREENING 07/30/2020  Screening Type Initial Screening  Distress experienced in past week (1-10) 6  Physician notified of physical symptoms Yes  Referral to clinical social work No     Clinical Social Worker follow up needed: No. Pt declined referral  If yes, follow up plan:  MICHELLE E ZAVALA, LCSW

## 2020-07-30 NOTE — Progress Notes (Signed)
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 showed no signs of cancer She has achieved complete response We discussed the role of maintenance therapy and she would like to continue I will see her back in 3 weeks for further follow-up Her next imaging study would be in March 2022  Thrombocytopenia [facility]) Her thrombocytopenia is stable Observe only for now  Essential hypertension Her blood pressure control fluctuates up and down Per previous discussion, her primary care doctor will manage her blood pressure medications from now on  Chronic back pain greater than 3 months duration She was seen for evaluation of back pain MRI has been ordered for further evaluation I would defer to her orthopedic surgeon for management   Orders Placed This Encounter  Procedures  . CBC with Differential/Platelet    Standing Status:   Standing    Number of Occurrences:   22    Standing Expiration Date:   07/30/2021  . Comprehensive metabolic panel    Standing Status:   Standing    Number of Occurrences:   33    Standing Expiration Date:   07/30/2021    All questions were answered. The patient knows to call the clinic with any problems, questions or concerns. The total time spent in the appointment was 20 minutes encounter with patients including review of chart and various tests results, discussions about plan of care and coordination of care plan   [name], MD 07/30/2020 8:35 AM  INTERVAL HISTORY: Please see below for problem oriented charting. She is seen prior to treatment today She has chronic left dry eye and she used eyedrops No blurring vision She have chronic stable mouth sores Her chronic back pain is stable and she is undergoing evaluation and MRI is scheduled for  tomorrow Denies abdominal pain, changes in bowel habits or vaginal bleeding Blood pressure control is satisfactory  SUMMARY OF ONCOLOGIC HISTORY: Oncology History Overview Note  Oncologic Summary: 1. 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 Random CA 125 elevation January 2019  Question mesenteric nodules and anterior abdominal wall nodule 3. GeneDx Breast/Ovary Panel negative (including BRCA, MMR's, RAD51 etc) ? [facility] BRACAnalysis  Negative for BRCA1/2 in tumor   Fallopian tube carcinoma ([place])  11/01/2012 Imaging   Ct abdomen 1.  Interval development of large mid abdominal mass highly concerning for right ovarian cancer.  There is mild omental nodularity on the left, and peritoneal disease cannot be completely excluded.  There is no ascites or other evidence of metastatic disease. 2.  Mild associated renal pelvocaliectasis bilaterally without obstruction.  Nonobstructing left renal calculus and a small right renal angiomyolipoma noted incidentally.   11/08/2012 Pathology Results   1. 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 - BENIGN FALLOPIAN TUBE WITH NONSPECIFIC CHRONIC INFLAMMATION. 2. Ovary and fallopian tube, left - BENIGN OVARY; NEGATIVE FOR ATYPIA OR MALIGNANCY. - BENIGN FALLOPIAN TUBE; NEGATIVE FOR ATYPIA OR MALIGNANCY. 3. Omentum, resection for tumor - HIGH GRADE CARCINOMA, SEE COMMENT. 4. Appendix, Other than  Incidental - FIBROUS OBLITERATION OF APPENDICEAL TIP. - NEGATIVE FOR MALIGNANCY.   11/08/2012 Surgery   Surgery: Exploratory laparotomy, bilateral salpingo-oophorectomy,  appendectomy, infacolic omentectomy, optimal debulking  Surgeons:  Paola A. Alycia Rossetti, MD; Lahoma Crocker, MD   Assistant: Caswell Corwin  Pathology: Bilateral fallopian tubes and ovaries to pathology. Appendix as well as omentum. 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  Operative findings: 25 cm right adnexal mass with smooth surface. Surgically absent uterus. Atrophic-appearing left ovary. Normal appearing appendix. Within the omentum there were centimeter nodules scattered throughout the omentum. The remainder of the surfaces were benign.   12/08/2012 Procedure   Impression:  Placement of a subcutaneous port device.  The catheter tip is in the lower SVC and ready to be used.    12/13/2012 - 03/28/2013 Chemotherapy   s/p 6 cycles of paclitaxel and carboplatin   12/13/2012 - 03/28/2013 Chemotherapy   The patient had 6 cycles of carboplatin and Taxol   03/24/2013 Imaging   US abdomen   04/24/2013 Imaging   CT abdomen Interval resection of the large right pelvic and lower abdominal mass lesion with apparent omentectomy.  No evidence for intraperitoneal free fluid on today's study.  No discernible peritoneal lesions.  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   09/07/2013 Genetic Testing   Patient has genetic testing done for BRCA1/2 panel Results revealed patient has no mutation(s):   07/09/2015 Imaging   CT abdomen 1. 12 mm obstructive calculus at the left ureteropelvic junction with moderate proximal hydronephrosis. 2. 2 small supraumbilical ventral hernias, one containing a short segment of the mid transverse colon and the other containing a short segment of the mid small bowel. There is no associated evidence to suggest bowel incarceration or obstruction at this time. 3. Tiny locule of gas non dependently in the lumen of the urinary bladder. This is presumably iatrogenic related to recent  catheterization for urinalysis. Alternatively, this could be seen in the setting of urinary tract infection with gas-forming organisms. Clinical correlation for history of recent catheterization is recommended. 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 5. Status post cholecystectomy. 6. Additional incidental findings, as above.    12/11/2016 Imaging   Ct abdomen 1. No evidence of metastatic ovarian cancer. 2. Recurrent subxiphoid ventral abdominal wall hernia containing transverse colon. No evidence of incarceration or obstruction. 3. Stable incidental findings in the liver and kidneys. No recurrent urinary tract calculus. 4. Progressive lower lumbar spondylosis. 5.  Aortic Atherosclerosis (ICD10-I70.0).    08/30/2017 Imaging   MRI thoracic spine 1. At T5-6 there is a small central disc protrusion contacting the ventral thoracic spinal cord. No central canal or foraminal stenosis. 2. At T9-10 there is a small right paracentral disc protrusion. 3.  No acute osseous injury of the thoracic spine. 4. No aggressive osseous lesion to suggest metastatic disease.   09/24/2017 Tumor Marker   Patient's tumor was tested for the following markers: CA-125 Results of the tumor marker test revealed 21.7   09/30/2017 Imaging   CT abdomen 1. New small clustered soft tissue nodules in the left lower quadrant in the sigmoid mesentery, largest 1.0 cm, which could represent recurrent peritoneal tumor implants. No ascites.  2. Midline high ventral abdominal wall hernia containing a portion of the transverse colon is mildly increased in size, and without bowel complication at this time. 3. Chronic findings include: Aortic Atherosclerosis (ICD10-I70.0). Diffuse hepatic steatosis. 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   10/11/2017 PET scan   1. Nodules in the sigmoid mesentery are hypermetabolic and highly worrisome for metastatic  disease. 2. Attic steatosis.    11/03/2017 Procedure   Successful CT-guided rectus abdominal muscle mass core biopsy. Path: - FOREIGN BODY GIANT CELL REACTION INVOLVING FIBROADIPOSE TISSUE AND SKELETAL MUSCLE. - NO EVIDENCE OF MALIGNANCY.   11/04/2017 Cancer Staging   Staging form: Fallopian Tube, AJCC 7th Edition - Clinical: FIGO Stage IIIC, calculated as Stage IV (rT3, N0, M1) - Signed by [name], MD on 08/09/2019   02/2018 Imaging   CT: 1. Continued increase in size small peritoneal nodules along the mesenteric border of the proximal sigmoid colon as well as along the serosal surface of the proximal sigmoid colon. 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 2. No evidence of distant disease. 3. Stable large ventral hernia.   05/2018 Imaging   PET: 1. Redemonstration of hypermetabolic nodules within the sigmoid mesocolon. Mild response to therapy relative to CT of 02/24/2018. Mixed response to therapy compared to the most recent PET of 10/11/2017. 2. Hypermetabolism within the right pelvic rectus musculature, increased since the prior PET.  Clinical service requested comparison to the 11/24/2017 CT. Index 10 mm nodule within the sigmoid mesocolon was similar to the 02/24/2018 CT, and as described on that exam, increased from 7 mm on 11/24/2017. More inferior nodule within the mesocolon measures 12 mm today on image 156/4 and 8 mm on 11/24/2017.   05/2018 Imaging   CT: IMPRESSION: 1. Since 02/24/2018, decreased size of peritoneal nodules centered in the sigmoid mesocolon. 2. No evidence of new or progressive disease. 3. Hepatic steatosis. 4. Subcentimeter right renal angiomyolipoma, similar. 5.  Aortic Atherosclerosis (ICD10-I70.0). 6. Ventral abdominal wall laxity containing transverse colon, similar.   08/2018 Imaging   CT: IMPRESSION: 1. Nodules within the sigmoid mesocolon have decreased in size compared to prior. No new peritoneal or  omental nodularity. 2. No evidence local recurrence the pelvis. 3. Ventral hernia contains a segment of transverse colon. No change from prior.     06/19/2019 Imaging   1. No evidence of metastatic disease in the abdomen pelvis. 2. No peritoneal or omental metastasis identified.  No free fluid. 3. Postcholecystectomy and hysterectomy. Comparison exams are made available. Comparison CT 09/08/2018 and 05/27/2018. PET-CT 05/27/2018    There is a nodule within the proximal aspect of the sigmoid colon measuring 2.2 by 2.2 cm. In comparison to prior CTs and PET-CT there was a hypermetabolic nodule at this location on the PET-CT of 08/27/2017 and on the CT of 09/08/2018 there was a small residual nodule. At that time (09/08/2018) the nodule measured 1.4 by 1.3 cm. Therefore this nodule has increased in the interval and concerning for recurrence of a serosal implant. The previous described lymph nodes in the sigmoid mesocolon and more central mesentery are not increased in size and not pathologic by size criteria.    Concern for recurrence of serosal metastasis in the proximal sigmoid colon with a 2 cm enlarging lesion. Lesion extends into the lumen. No evidence of high-grade obstruction. Consider FDG PET scan and/or potential colonoscopy for evaluation.   06/29/2019 PET scan   1. Enlarging serosal implant within the proximal sigmoid colon has intense metabolic activity most consistent with malignancy. Lesion exhibits a somewhat indolent progression as present on PET-CT scan from 10/11/2017 and 05/27/2018. 2. Focal hypermetabolic activity within the RIGHT rectus muscle just off  midline is slightly decreased from comparison exam. This may be benign inflammation related to prior laparotomy, however malignancy not excluded.   07/26/2019 Procedure   She had colonoscopy which showed multiple polyps.  6 polyps were removed from the cecum, measuring 3 to 6 mm in size.  One 12 mm polyp was removed from  ascending colon and another 1 measures 7 mm.  One 5 mm polyp was removed from the transverse colon.  There is tumor noted in the sigmoid colon, approximately 35 cm from the anus which was biopsied.  The tumor appeared to be fungating, infiltrative and ulcerated but nonobstructive.  It encompassed approximately one third of the circumference of the lumen.   07/26/2019 Pathology Results   Multiple polyps came back tubular adenomas.  2 ascending polyps came back sessile serrated adenoma months.  Sigmoid colon biopsy confirmed metastatic high-grade serous carcinoma.  The morphology and immunophenotype are consistent with metastatic high-grade serous carcinoma from tubal/ovarian primary site.   08/17/2019 - 12/12/2019 Chemotherapy   The patient had carboplatin and taxol   10/23/2019 Imaging   1. Sigmoid lesion with diminished size/conspicuity difficult to measure on the previous exam. Adjacent lymph nodes and nodularity less than a cm, largest on coronal image 48 measuring 5 mm. 2. Right rectus muscle with some thickening with mildly convex margin seen posteriorly on image 72 of series 2. This could be due to postsurgical change, however, metastatic disease is not excluded. Attention on follow-up is suggested. 3. No evidence for new metastatic disease. 4. Small hiatal hernia. 5. 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   Aortic Atherosclerosis (ICD10-I70.0).   01/01/2020 Imaging   1. No signs of new disease. 2. Tiny lymph nodes in the area of concern in the LEFT lower quadrant z. 3. Added density and subtle contour irregularity involving the rectus muscles best seen on sagittal images, associated with site of prior surgical incision just to the RIGHT of midline (image 72, series 2 and image 58, series 5. Not significantly changed compared to prior studies. This measures approximately 2.1 x 1 cm in the sagittal plane and is less well-defined in the axial plane. Potentially postoperative change. Attention  on follow-up. 4. Aortic atherosclerosis.   Aortic Atherosclerosis (ICD10-I70.0).       01/29/2020 -  Chemotherapy   The patient had bevacizumab-bvzr (ZIRABEV) 1,300 mg in sodium chloride 0.9 % 100 mL chemo infusion, 15 mg/kg = 1,300 mg, Intravenous,  Once, 8 of 11 cycles Administration: 1,300 mg (01/29/2020), 1,300 mg (03/27/2020), 1,300 mg (04/18/2020), 1,300 mg (02/19/2020), 1,300 mg (05/07/2020), 1,300 mg (05/29/2020), 1,300 mg (06/18/2020), 1,300 mg (07/09/2020)  for chemotherapy treatment.    05/06/2020 Imaging   1. Status post hysterectomy and oophorectomy. 2. No specific findings of recurrent or metastatic disease in the abdomen or pelvis. 3. No change in postoperative appearance of low midline incision. 4. Unchanged small prominent subcentimeter left iliac lymph nodes.     REVIEW OF SYSTEMS:   Constitutional: Denies fevers, chills or abnormal weight loss Eyes: Denies blurriness of vision Respiratory: Denies cough, dyspnea or wheezes Cardiovascular: Denies palpitation, chest discomfort or lower extremity swelling Gastrointestinal:  Denies nausea, heartburn or change in bowel habits Skin: Denies abnormal skin rashes Lymphatics: Denies new lymphadenopathy or easy bruising Neurological:Denies numbness, tingling or new weaknesses Behavioral/Psych: Mood is stable, no new changes  All other systems were reviewed with the patient and are negative.  I have reviewed the past medical history, past surgical history, social history and family history with the  patient and they are unchanged from previous note.  ALLERGIES:  is allergic to [employer] tartrate] and cortisone.  MEDICATIONS:  Current Outpatient Medications  Medication Sig Dispense Refill  . busPIRone (BUSPAR) 7.5 MG tablet Take 15 mg by mouth 2 (two) times daily.    Marland Kitchen amLODipine (NORVASC) 10 MG tablet Take 1 tablet (10 mg total) by mouth daily. 30 tablet 11  . Armodafinil 250 MG tablet Take 1 tablet (250 mg total) by mouth  daily. 90 tablet 1  . chlorthalidone (HYGROTON) 25 MG tablet Take 25 mg by mouth daily.    . cholecalciferol (VITAMIN D3) 25 MCG (1000 UT) tablet Take 1,000 Units by mouth daily.    . colestipol (COLESTID) 1 g tablet Take 0.5-1 g by mouth daily. Pt taking 1/2 to 1 tablet daily depending on meal choice; taken 1 hour after morning meds , and can not eat for another hour    . eszopiclone (LUNESTA) 2 MG TABS tablet Take immediately before bedtime 90 tablet 0  . FLUoxetine (PROZAC) 10 MG capsule Take 10 mg by mouth every morning. Takes with a 56m capsule for her total dose of 323m    . Marland KitchenLUoxetine (PROZAC) 20 MG capsule Take 20 mg by mouth every morning. Takes with a 1076mapsule for her total dose of 67m66m  . lidocaine-prilocaine (EMLA) cream Apply 1 application topically as needed. 30 g 1  . MELATONIN PO Take 10 mg by mouth at bedtime.     . metFORMIN (GLUCOPHAGE-XR) 500 MG 24 hr tablet Take 500 mg by mouth daily with breakfast.   3  . metoprolol tartrate (LOPRESSOR) 25 MG tablet Take 1 tablet (25 mg total) by mouth 2 (two) times daily. 60 tablet 3  . Multiple Vitamin (MULTIVITAMIN WITH MINERALS) TABS Take 1 tablet by mouth every morning.     . ondansetron (ZOFRAN) 8 MG tablet Take 1 tablet (8 mg total) by mouth every 8 (eight) hours as needed for refractory nausea / vomiting. Start on day 3 after carboplatin chemo. 30 tablet 1  . prochlorperazine (COMPAZINE) 10 MG tablet Take 1 tablet (10 mg total) by mouth every 6 (six) hours as needed (Nausea or vomiting). 30 tablet 1  . rosuvastatin (CRESTOR) 20 MG tablet Take 20 mg by mouth every morning.     . teMarland Kitchenmisartan (MICARDIS) 40 MG tablet Take 40 mg by mouth daily.     No current facility-administered medications for this visit.   Facility-Administered Medications Ordered in Other Visits  Medication Dose Route Frequency Provider Last Rate Last Admin  . sodium chloride flush (NS) 0.9 % injection 10 mL  10 mL Intravenous PRN GehrNancy Marus   10 mL  at 05/12/17 [id number] EXAMINATION: ECOG PERFORMANCE STATUS: 1 - Symptomatic but completely ambulatory  Vitals:   07/30/20 0820  BP: 136/80  Pulse: 62  Resp: 16  Temp: (!) 97 F (36.1 C)  SpO2: 100%   Filed Weights   07/30/20 0820  Weight: 195 lb (88.5 kg)    GENERAL:alert, no distress and comfortable SKIN: skin color, texture, turgor are normal, no rashes or significant lesions EYES: Noted mild redness on the left eye  OROPHARYNX:no exudate, no erythema and lips, buccal mucosa, and tongue normal  NECK: supple, thyroid normal size, non-tender, without nodularity LYMPH:  no palpable lymphadenopathy in the cervical, axillary or inguinal LUNGS: clear to auscultation and percussion with normal breathing effort HEART: regular rate & rhythm and no murmurs and no lower  extremity edema ABDOMEN:abdomen soft, non-tender and normal bowel sounds Musculoskeletal:no cyanosis of digits and no clubbing  NEURO: alert & oriented x 3 with fluent speech, no focal motor/sensory deficits  LABORATORY DATA:  I have reviewed the data as listed    Component Value Date/Time   NA 139 07/09/2020 0750   NA 141 12/02/2016 1219   K 4.0 07/09/2020 0750   K 3.7 12/02/2016 1219   CL 103 07/09/2020 0750   CL 103 01/18/2013 1329   CO2 26 07/09/2020 0750   CO2 27 12/02/2016 1219   GLUCOSE 105 (H) 07/09/2020 0750   GLUCOSE 98 12/02/2016 1219   GLUCOSE 110 (H) 01/18/2013 1329   BUN 19 07/09/2020 0750   BUN 15.3 12/02/2016 1219   CREATININE 0.71 07/09/2020 0750   CREATININE 0.7 12/02/2016 1219   CALCIUM 9.9 07/09/2020 0750   CALCIUM 10.1 12/02/2016 1219   PROT 7.0 07/09/2020 0750   PROT 7.1 12/02/2016 1219   ALBUMIN 3.9 07/09/2020 0750   ALBUMIN 4.2 12/02/2016 1219   AST 25 07/09/2020 0750   AST 21 12/02/2016 1219   ALT 38 07/09/2020 0750   ALT 27 12/02/2016 1219   ALKPHOS 82 07/09/2020 0750   ALKPHOS 104 12/02/2016 1219   BILITOT 0.4 07/09/2020 0750   BILITOT 0.37 12/02/2016 1219    GFRNONAA >60 07/09/2020 0750   GFRAA >60 05/07/2020 0833    No results found for: SPEP, UPEP  Lab Results  Component Value Date   WBC 5.0 07/30/2020   NEUTROABS 2.7 07/30/2020   HGB 13.3 07/30/2020   HCT 39.8 07/30/2020   MCV 91.9 07/30/2020   PLT 125 (L) 07/30/2020      Chemistry      Component Value Date/Time   NA 139 07/09/2020 0750   NA 141 12/02/2016 1219   K 4.0 07/09/2020 0750   K 3.7 12/02/2016 1219   CL 103 07/09/2020 0750   CL 103 01/18/2013 1329   CO2 26 07/09/2020 0750   CO2 27 12/02/2016 1219   BUN 19 07/09/2020 0750   BUN 15.3 12/02/2016 1219   CREATININE 0.71 07/09/2020 0750   CREATININE 0.7 12/02/2016 1219   GLU 236 12/13/2012 1558      Component Value Date/Time   CALCIUM 9.9 07/09/2020 0750   CALCIUM 10.1 12/02/2016 1219   ALKPHOS 82 07/09/2020 0750   ALKPHOS 104 12/02/2016 1219   AST 25 07/09/2020 0750   AST 21 12/02/2016 1219   ALT 38 07/09/2020 0750   ALT 27 12/02/2016 1219   BILITOT 0.4 07/09/2020 0750   BILITOT 0.37 12/02/2016 1219

## 2020-07-30 NOTE — Patient Instructions (Signed)

## 2020-07-30 NOTE — Assessment & Plan Note (Signed)
Her last CT imaging showed no signs of cancer She has achieved complete response We discussed the role of maintenance therapy and she would like to continue I will see her back in 3 weeks for further follow-up Her next imaging study would be in March 2022 

## 2020-07-31 ENCOUNTER — Inpatient Hospital Stay: Payer: Medicare HMO

## 2020-07-31 ENCOUNTER — Other Ambulatory Visit: Payer: Self-pay

## 2020-07-31 DIAGNOSIS — Z5112 Encounter for antineoplastic immunotherapy: Secondary | ICD-10-CM | POA: Diagnosis not present

## 2020-07-31 DIAGNOSIS — C5701 Malignant neoplasm of right fallopian tube: Secondary | ICD-10-CM

## 2020-07-31 DIAGNOSIS — C5700 Malignant neoplasm of unspecified fallopian tube: Secondary | ICD-10-CM | POA: Diagnosis not present

## 2020-07-31 MED ORDER — SODIUM CHLORIDE 0.9% FLUSH
10.0000 mL | Freq: Once | INTRAVENOUS | Status: AC
  Administered 2020-07-31: 10 mL
  Filled 2020-07-31: qty 10

## 2020-07-31 MED ORDER — HEPARIN SOD (PORK) LOCK FLUSH 100 UNIT/ML IV SOLN
500.0000 [IU] | Freq: Once | INTRAVENOUS | Status: AC
  Administered 2020-07-31: 500 [IU]
  Filled 2020-07-31: qty 5

## 2020-08-01 ENCOUNTER — Ambulatory Visit (HOSPITAL_COMMUNITY)
Admission: RE | Admit: 2020-08-01 | Discharge: 2020-08-01 | Disposition: A | Discharge status: Home / Self Care | Payer: Medicare HMO | Source: Ambulatory Visit | Attending: Neurosurgery | Admitting: Neurosurgery

## 2020-08-01 DIAGNOSIS — M5414 Radiculopathy, thoracic region: Secondary | ICD-10-CM

## 2020-08-01 DIAGNOSIS — M5126 Other intervertebral disc displacement, lumbar region: Secondary | ICD-10-CM | POA: Diagnosis not present

## 2020-08-01 DIAGNOSIS — M47816 Spondylosis without myelopathy or radiculopathy, lumbar region: Secondary | ICD-10-CM | POA: Diagnosis not present

## 2020-08-01 DIAGNOSIS — M47814 Spondylosis without myelopathy or radiculopathy, thoracic region: Secondary | ICD-10-CM | POA: Diagnosis not present

## 2020-08-01 DIAGNOSIS — M5124 Other intervertebral disc displacement, thoracic region: Secondary | ICD-10-CM | POA: Diagnosis not present

## 2020-08-01 DIAGNOSIS — M48061 Spinal stenosis, lumbar region without neurogenic claudication: Secondary | ICD-10-CM | POA: Diagnosis not present

## 2020-08-01 DIAGNOSIS — N644 Mastodynia: Secondary | ICD-10-CM | POA: Diagnosis not present

## 2020-08-01 MED ORDER — HEPARIN SOD (PORK) LOCK FLUSH 100 UNIT/ML IV SOLN: 500.0000 [IU] | Freq: Once | INTRAVENOUS | Status: DC

## 2020-08-01 MED ORDER — GADOBUTROL 1 MMOL/ML IV SOLN
8.0000 mL | Freq: Once | INTRAVENOUS | Status: AC | PRN
  Administered 2020-08-01: 8 mL via INTRAVENOUS

## 2020-08-06 DIAGNOSIS — E782 Mixed hyperlipidemia: Secondary | ICD-10-CM | POA: Diagnosis not present

## 2020-08-06 DIAGNOSIS — M159 Polyosteoarthritis, unspecified: Secondary | ICD-10-CM | POA: Diagnosis not present

## 2020-08-06 DIAGNOSIS — I10 Essential (primary) hypertension: Secondary | ICD-10-CM | POA: Diagnosis not present

## 2020-08-06 DIAGNOSIS — E1159 Type 2 diabetes mellitus with other circulatory complications: Secondary | ICD-10-CM | POA: Diagnosis not present

## 2020-08-12 DIAGNOSIS — M5414 Radiculopathy, thoracic region: Secondary | ICD-10-CM | POA: Diagnosis not present

## 2020-08-14 DIAGNOSIS — H43813 Vitreous degeneration, bilateral: Secondary | ICD-10-CM | POA: Diagnosis not present

## 2020-08-14 DIAGNOSIS — Z961 Presence of intraocular lens: Secondary | ICD-10-CM | POA: Diagnosis not present

## 2020-08-14 DIAGNOSIS — H5111 Convergence insufficiency: Secondary | ICD-10-CM | POA: Diagnosis not present

## 2020-08-14 DIAGNOSIS — E119 Type 2 diabetes mellitus without complications: Secondary | ICD-10-CM | POA: Diagnosis not present

## 2020-08-14 DIAGNOSIS — H16223 Keratoconjunctivitis sicca, not specified as Sjogren's, bilateral: Secondary | ICD-10-CM | POA: Diagnosis not present

## 2020-08-19 DIAGNOSIS — F411 Generalized anxiety disorder: Secondary | ICD-10-CM | POA: Diagnosis not present

## 2020-08-19 DIAGNOSIS — F4310 Post-traumatic stress disorder, unspecified: Secondary | ICD-10-CM | POA: Diagnosis not present

## 2020-08-19 DIAGNOSIS — G4733 Obstructive sleep apnea (adult) (pediatric): Secondary | ICD-10-CM | POA: Diagnosis not present

## 2020-08-19 DIAGNOSIS — E1159 Type 2 diabetes mellitus with other circulatory complications: Secondary | ICD-10-CM | POA: Diagnosis not present

## 2020-08-19 DIAGNOSIS — G47419 Narcolepsy without cataplexy: Secondary | ICD-10-CM | POA: Diagnosis not present

## 2020-08-19 DIAGNOSIS — E782 Mixed hyperlipidemia: Secondary | ICD-10-CM | POA: Diagnosis not present

## 2020-08-19 DIAGNOSIS — I10 Essential (primary) hypertension: Secondary | ICD-10-CM | POA: Diagnosis not present

## 2020-08-19 DIAGNOSIS — M159 Polyosteoarthritis, unspecified: Secondary | ICD-10-CM | POA: Diagnosis not present

## 2020-08-19 DIAGNOSIS — R5383 Other fatigue: Secondary | ICD-10-CM | POA: Diagnosis not present

## 2020-08-20 ENCOUNTER — Other Ambulatory Visit: Payer: Self-pay

## 2020-08-20 ENCOUNTER — Inpatient Hospital Stay (HOSPITAL_BASED_OUTPATIENT_CLINIC_OR_DEPARTMENT_OTHER): Payer: Medicare HMO | Admitting: Hematology and Oncology

## 2020-08-20 ENCOUNTER — Inpatient Hospital Stay: Payer: Medicare HMO

## 2020-08-20 ENCOUNTER — Inpatient Hospital Stay: Payer: Medicare HMO | Attending: Gynecologic Oncology

## 2020-08-20 ENCOUNTER — Encounter: Payer: Self-pay | Admitting: Hematology and Oncology

## 2020-08-20 VITALS — BP 124/72 | HR 65 | Temp 98.2°F | Resp 18

## 2020-08-20 DIAGNOSIS — C5700 Malignant neoplasm of unspecified fallopian tube: Secondary | ICD-10-CM

## 2020-08-20 DIAGNOSIS — Z90721 Acquired absence of ovaries, unilateral: Secondary | ICD-10-CM | POA: Insufficient documentation

## 2020-08-20 DIAGNOSIS — C5701 Malignant neoplasm of right fallopian tube: Secondary | ICD-10-CM

## 2020-08-20 DIAGNOSIS — Z90710 Acquired absence of both cervix and uterus: Secondary | ICD-10-CM | POA: Insufficient documentation

## 2020-08-20 DIAGNOSIS — I10 Essential (primary) hypertension: Secondary | ICD-10-CM | POA: Insufficient documentation

## 2020-08-20 DIAGNOSIS — D696 Thrombocytopenia, unspecified: Secondary | ICD-10-CM | POA: Diagnosis not present

## 2020-08-20 DIAGNOSIS — Z5112 Encounter for antineoplastic immunotherapy: Secondary | ICD-10-CM | POA: Insufficient documentation

## 2020-08-20 DIAGNOSIS — Z79899 Other long term (current) drug therapy: Secondary | ICD-10-CM | POA: Insufficient documentation

## 2020-08-20 DIAGNOSIS — Z9049 Acquired absence of other specified parts of digestive tract: Secondary | ICD-10-CM | POA: Insufficient documentation

## 2020-08-20 DIAGNOSIS — Z7189 Other specified counseling: Secondary | ICD-10-CM

## 2020-08-20 LAB — CBC WITH DIFFERENTIAL/PLATELET
Abs Immature Granulocytes: 0.01 10*3/uL (ref 0.00–0.07)
Basophils Absolute: 0 10*3/uL (ref 0.0–0.1)
Basophils Relative: 1 %
Eosinophils Absolute: 0.1 10*3/uL (ref 0.0–0.5)
Eosinophils Relative: 2 %
HCT: 42.3 % (ref 36.0–46.0)
Hemoglobin: 13.9 g/dL (ref 12.0–15.0)
Immature Granulocytes: 0 %
Lymphocytes Relative: 35 %
Lymphs Abs: 1.9 10*3/uL (ref 0.7–4.0)
MCH: 30 pg (ref 26.0–34.0)
MCHC: 32.9 g/dL (ref 30.0–36.0)
MCV: 91.4 fL (ref 80.0–100.0)
Monocytes Absolute: 0.5 10*3/uL (ref 0.1–1.0)
Monocytes Relative: 9 %
Neutro Abs: 3 10*3/uL (ref 1.7–7.7)
Neutrophils Relative %: 53 %
Platelets: 136 10*3/uL — ABNORMAL LOW (ref 150–400)
RBC: 4.63 MIL/uL (ref 3.87–5.11)
RDW: 12.7 % (ref 11.5–15.5)
WBC: 5.6 10*3/uL (ref 4.0–10.5)
nRBC: 0 % (ref 0.0–0.2)

## 2020-08-20 LAB — COMPREHENSIVE METABOLIC PANEL
ALT: 41 U/L (ref 0–44)
AST: 31 U/L (ref 15–41)
Albumin: 4.1 g/dL (ref 3.5–5.0)
Alkaline Phosphatase: 84 U/L (ref 38–126)
Anion gap: 11 (ref 5–15)
BUN: 14 mg/dL (ref 8–23)
CO2: 26 mmol/L (ref 22–32)
Calcium: 9.9 mg/dL (ref 8.9–10.3)
Chloride: 103 mmol/L (ref 98–111)
Creatinine, Ser: 0.73 mg/dL (ref 0.44–1.00)
GFR, Estimated: >60 mL/min (ref >60)
Glucose, Bld: 115 mg/dL — ABNORMAL HIGH (ref 70–99)
Potassium: 3.9 mmol/L (ref 3.5–5.1)
Sodium: 140 mmol/L (ref 135–145)
Total Bilirubin: 0.6 mg/dL (ref 0.3–1.2)
Total Protein: 7.1 g/dL (ref 6.5–8.1)

## 2020-08-20 LAB — TOTAL PROTEIN, URINE DIPSTICK: Protein, ur: NEGATIVE mg/dL

## 2020-08-20 MED ORDER — SODIUM CHLORIDE 0.9 % IV SOLN
15.0000 mg/kg | Freq: Once | INTRAVENOUS | Status: AC
  Administered 2020-08-20: 1300 mg via INTRAVENOUS
  Filled 2020-08-20: qty 48

## 2020-08-20 MED ORDER — HEPARIN SOD (PORK) LOCK FLUSH 100 UNIT/ML IV SOLN
500.0000 [IU] | Freq: Once | INTRAVENOUS | Status: AC | PRN
  Administered 2020-08-20: 500 [IU]
  Filled 2020-08-20: qty 5

## 2020-08-20 MED ORDER — SODIUM CHLORIDE 0.9% FLUSH
10.0000 mL | INTRAVENOUS | Status: DC | PRN
  Administered 2020-08-20: 10 mL
  Filled 2020-08-20: qty 10

## 2020-08-20 MED ORDER — SODIUM CHLORIDE 0.9% FLUSH
10.0000 mL | Freq: Once | INTRAVENOUS | Status: AC
Start: 2020-08-20 — End: 2020-08-20
  Administered 2020-08-20: 10 mL
  Filled 2020-08-20: qty 10

## 2020-08-20 MED ORDER — SODIUM CHLORIDE 0.9 % IV SOLN
Freq: Once | INTRAVENOUS | Status: AC
  Filled 2020-08-20: qty 250

## 2020-08-20 NOTE — Progress Notes (Signed)
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[facility]) Her last CT imaging showed no signs of cancer She has achieved complete response We discussed the role of maintenance therapy and she would like to continue I will see her back in 3 weeks for further follow-up Her next imaging study would be in March 2022  Essential hypertension Her blood pressure control fluctuates up and down Per previous discussion, her primary care doctor will manage her blood pressure medications from now on  Thrombocytopenia ([place]) Her thrombocytopenia is stable Observe only for now   No orders of the defined types were placed in this encounter.   All questions were answered. The patient knows to call the clinic with any problems, questions or concerns. The total time spent in the appointment was 20 minutes encounter with patients including review of chart and various tests results, discussions about plan of care and coordination of care plan   [name], MD 08/20/2020 10:49 AM  INTERVAL HISTORY: Please see below for problem oriented charting. She returns for further follow-up She has seen her spine surgeon recently with MRI She tolerated recent treatment well Blood pressure is well controlled She denies recent nausea, abdominal pain or changes in bowel habits  SUMMARY OF ONCOLOGIC HISTORY: Oncology History Overview Note  Oncologic Summary: 1. 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 Random CA 125 elevation January 2019  Question mesenteric nodules and anterior abdominal wall  nodule 3. GeneDx Breast/Ovary Panel negative (including BRCA, MMR's, RAD51 etc) ? My[facility] BRACAnalysis  Negative for BRCA1/2 in tumor   Fallopian tube carcinoma ([place])  11/01/2012 Imaging   Ct abdomen 1.  Interval development of large mid abdominal mass highly concerning for right ovarian cancer.  There is mild omental nodularity on the left, and peritoneal disease cannot be completely excluded.  There is no ascites or other evidence of metastatic disease. 2.  Mild associated renal pelvocaliectasis bilaterally without obstruction.  Nonobstructing left renal calculus and a small right renal angiomyolipoma noted incidentally.   11/08/2012 Pathology Results   1. 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 - BENIGN FALLOPIAN TUBE WITH NONSPECIFIC CHRONIC INFLAMMATION. 2. Ovary and fallopian tube, left - BENIGN OVARY; NEGATIVE FOR ATYPIA OR MALIGNANCY. - BENIGN FALLOPIAN TUBE; NEGATIVE FOR ATYPIA OR MALIGNANCY. 3. Omentum, resection for tumor - HIGH GRADE CARCINOMA, SEE COMMENT. 4. Appendix, Other than Incidental - FIBROUS OBLITERATION OF APPENDICEAL TIP. - NEGATIVE FOR MALIGNANCY.   11/08/2012 Surgery   Surgery: Exploratory laparotomy, bilateral salpingo-oophorectomy, appendectomy, infacolic omentectomy, optimal debulking  Surgeons:  Paola A. Alycia Rossetti, MD; Lahoma Crocker, MD   Assistant: Caswell Corwin  Pathology: Bilateral fallopian tubes and ovaries to pathology. Appendix as well as omentum. 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  Operative findings: 25 cm right adnexal mass with smooth surface. Surgically absent uterus. Atrophic-appearing left ovary. Normal appearing appendix. Within the omentum there were centimeter nodules scattered throughout the  omentum. The remainder of the surfaces were  benign.   12/08/2012 Procedure   Impression:  Placement of a subcutaneous port device.  The catheter tip is in the lower SVC and ready to be used.    12/13/2012 - 03/28/2013 Chemotherapy   s/p 6 cycles of paclitaxel and carboplatin   12/13/2012 - 03/28/2013 Chemotherapy   The patient had 6 cycles of carboplatin and Taxol   03/24/2013 Imaging   US abdomen   04/24/2013 Imaging   CT abdomen Interval resection of the large right pelvic and lower abdominal mass lesion with apparent omentectomy.  No evidence for intraperitoneal free fluid on today's study.  No discernible peritoneal lesions.  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   09/07/2013 Genetic Testing   Patient has genetic testing done for BRCA1/2 panel Results revealed patient has no mutation(s):   07/09/2015 Imaging   CT abdomen 1. 12 mm obstructive calculus at the left ureteropelvic junction with moderate proximal hydronephrosis. 2. 2 small supraumbilical ventral hernias, one containing a short segment of the mid transverse colon and the other containing a short segment of the mid small bowel. There is no associated evidence to suggest bowel incarceration or obstruction at this time. 3. Tiny locule of gas non dependently in the lumen of the urinary bladder. This is presumably iatrogenic related to recent catheterization for urinalysis. Alternatively, this could be seen in the setting of urinary tract infection with gas-forming organisms. Clinical correlation for history of recent catheterization is recommended. 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 5. Status post cholecystectomy. 6. Additional incidental findings, as above.    12/11/2016 Imaging   Ct abdomen 1. No evidence of metastatic ovarian cancer. 2. Recurrent subxiphoid ventral abdominal wall hernia containing transverse colon. No evidence of incarceration or obstruction. 3. Stable incidental findings  in the liver and kidneys. No recurrent urinary tract calculus. 4. Progressive lower lumbar spondylosis. 5.  Aortic Atherosclerosis (ICD10-I70.0).    08/30/2017 Imaging   MRI thoracic spine 1. At T5-6 there is a small central disc protrusion contacting the ventral thoracic spinal cord. No central canal or foraminal stenosis. 2. At T9-10 there is a small right paracentral disc protrusion. 3.  No acute osseous injury of the thoracic spine. 4. No aggressive osseous lesion to suggest metastatic disease.   09/24/2017 Tumor Marker   Patient's tumor was tested for the following markers: CA-125 Results of the tumor marker test revealed 21.7   09/30/2017 Imaging   CT abdomen 1. New small clustered soft tissue nodules in the left lower quadrant in the sigmoid mesentery, largest 1.0 cm, which could represent recurrent peritoneal tumor implants. No ascites.  2. Midline high ventral abdominal wall hernia containing a portion of the transverse colon is mildly increased in size, and without bowel complication at this time. 3. Chronic findings include: Aortic Atherosclerosis (ICD10-I70.0). Diffuse hepatic steatosis. 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   10/11/2017 PET scan   1. Nodules in the sigmoid mesentery are hypermetabolic and highly worrisome for metastatic disease. 2. Attic steatosis.    11/03/2017 Procedure   Successful CT-guided rectus abdominal muscle mass core biopsy. Path: - FOREIGN BODY GIANT CELL REACTION INVOLVING FIBROADIPOSE TISSUE AND SKELETAL MUSCLE. - NO EVIDENCE OF MALIGNANCY.   11/04/2017 Cancer Staging   Staging form: Fallopian Tube, AJCC 7th Edition - Clinical: FIGO Stage IIIC, calculated as Stage IV (rT3, N0, M1) - Signed by [name], MD on 08/09/2019   02/2018 Imaging   CT: 1. Continued increase in  size small peritoneal nodules along the mesenteric border of the proximal sigmoid colon as well as along the serosal  surface of the proximal sigmoid colon. 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 2. No evidence of distant disease. 3. Stable large ventral hernia.   05/2018 Imaging   PET: 1. Redemonstration of hypermetabolic nodules within the sigmoid mesocolon. Mild response to therapy relative to CT of 02/24/2018. Mixed response to therapy compared to the most recent PET of 10/11/2017. 2. Hypermetabolism within the right pelvic rectus musculature, increased since the prior PET.  Clinical service requested comparison to the 11/24/2017 CT. Index 10 mm nodule within the sigmoid mesocolon was similar to the 02/24/2018 CT, and as described on that exam, increased from 7 mm on 11/24/2017. More inferior nodule within the mesocolon measures 12 mm today on image 156/4 and 8 mm on 11/24/2017.   05/2018 Imaging   CT: IMPRESSION: 1. Since 02/24/2018, decreased size of peritoneal nodules centered in the sigmoid mesocolon. 2. No evidence of new or progressive disease. 3. Hepatic steatosis. 4. Subcentimeter right renal angiomyolipoma, similar. 5.  Aortic Atherosclerosis (ICD10-I70.0). 6. Ventral abdominal wall laxity containing transverse colon, similar.   08/2018 Imaging   CT: IMPRESSION: 1. Nodules within the sigmoid mesocolon have decreased in size compared to prior. No new peritoneal or omental nodularity. 2. No evidence local recurrence the pelvis. 3. Ventral hernia contains a segment of transverse colon. No change from prior.     06/19/2019 Imaging   1. No evidence of metastatic disease in the abdomen pelvis. 2. No peritoneal or omental metastasis identified.  No free fluid. 3. Postcholecystectomy and hysterectomy. Comparison exams are made available. Comparison CT 09/08/2018 and 05/27/2018. PET-CT 05/27/2018    There is a nodule within the proximal aspect of the sigmoid colon measuring 2.2 by 2.2 cm. In comparison to prior CTs and PET-CT there was a hypermetabolic nodule  at this location on the PET-CT of 08/27/2017 and on the CT of 09/08/2018 there was a small residual nodule. At that time (09/08/2018) the nodule measured 1.4 by 1.3 cm. Therefore this nodule has increased in the interval and concerning for recurrence of a serosal implant. The previous described lymph nodes in the sigmoid mesocolon and more central mesentery are not increased in size and not pathologic by size criteria.    Concern for recurrence of serosal metastasis in the proximal sigmoid colon with a 2 cm enlarging lesion. Lesion extends into the lumen. No evidence of high-grade obstruction. Consider FDG PET scan and/or potential colonoscopy for evaluation.   06/29/2019 PET scan   1. Enlarging serosal implant within the proximal sigmoid colon has intense metabolic activity most consistent with malignancy. Lesion exhibits a somewhat indolent progression as present on PET-CT scan from 10/11/2017 and 05/27/2018. 2. Focal hypermetabolic activity within the RIGHT rectus muscle just off midline is slightly decreased from comparison exam. This may be benign inflammation related to prior laparotomy, however malignancy not excluded.   07/26/2019 Procedure   She had colonoscopy which showed multiple polyps.  6 polyps were removed from the cecum, measuring 3 to 6 mm in size.  One 12 mm polyp was removed from ascending colon and another 1 measures 7 mm.  One 5 mm polyp was removed from the transverse colon.  There is tumor noted in the sigmoid colon, approximately 35 cm from the anus which was biopsied.  The tumor appeared to be fungating, infiltrative and ulcerated but nonobstructive.  It encompassed approximately one third of the circumference  of the lumen.   07/26/2019 Pathology Results   Multiple polyps came back tubular adenomas.  2 ascending polyps came back sessile serrated adenoma months.  Sigmoid colon biopsy confirmed metastatic high-grade serous carcinoma.  The morphology and immunophenotype are  consistent with metastatic high-grade serous carcinoma from tubal/ovarian primary site.   08/17/2019 - 12/12/2019 Chemotherapy   The patient had carboplatin and taxol   10/23/2019 Imaging   1. Sigmoid lesion with diminished size/conspicuity difficult to measure on the previous exam. Adjacent lymph nodes and nodularity less than a cm, largest on coronal image 48 measuring 5 mm. 2. Right rectus muscle with some thickening with mildly convex margin seen posteriorly on image 72 of series 2. This could be due to postsurgical change, however, metastatic disease is not excluded. Attention on follow-up is suggested. 3. No evidence for new metastatic disease. 4. Small hiatal hernia. 5. 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   Aortic Atherosclerosis (ICD10-I70.0).   01/01/2020 Imaging   1. No signs of new disease. 2. Tiny lymph nodes in the area of concern in the LEFT lower quadrant z. 3. Added density and subtle contour irregularity involving the rectus muscles best seen on sagittal images, associated with site of prior surgical incision just to the RIGHT of midline (image 72, series 2 and image 58, series 5. Not significantly changed compared to prior studies. This measures approximately 2.1 x 1 cm in the sagittal plane and is less well-defined in the axial plane. Potentially postoperative change. Attention on follow-up. 4. Aortic atherosclerosis.   Aortic Atherosclerosis (ICD10-I70.0).       01/29/2020 -  Chemotherapy    Patient is on Treatment Plan: OVARIAN BEVACIZUMAB      05/06/2020 Imaging   1. Status post hysterectomy and oophorectomy. 2. No specific findings of recurrent or metastatic disease in the abdomen or pelvis. 3. No change in postoperative appearance of low midline incision. 4. Unchanged small prominent subcentimeter left iliac lymph nodes.     REVIEW OF SYSTEMS:   Constitutional: Denies fevers, chills or abnormal weight loss Eyes: Denies blurriness of vision Ears, nose,  mouth, throat, and face: Denies mucositis or sore throat Respiratory: Denies cough, dyspnea or wheezes Cardiovascular: Denies palpitation, chest discomfort or lower extremity swelling Gastrointestinal:  Denies nausea, heartburn or change in bowel habits Skin: Denies abnormal skin rashes Lymphatics: Denies new lymphadenopathy or easy bruising Neurological:Denies numbness, tingling or new weaknesses Behavioral/Psych: Mood is stable, no new changes  All other systems were reviewed with the patient and are negative.  I have reviewed the past medical history, past surgical history, social history and family history with the patient and they are unchanged from previous note.  ALLERGIES:  is allergic to [employer] tartrate] and cortisone.  MEDICATIONS:  Current Outpatient Medications  Medication Sig Dispense Refill  . amLODipine (NORVASC) 10 MG tablet Take 1 tablet (10 mg total) by mouth daily. 30 tablet 11  . Armodafinil 250 MG tablet Take 1 tablet (250 mg total) by mouth daily. 90 tablet 1  . busPIRone (BUSPAR) 7.5 MG tablet Take 15 mg by mouth 2 (two) times daily.    . chlorthalidone (HYGROTON) 25 MG tablet Take 25 mg by mouth daily.    . cholecalciferol (VITAMIN D3) 25 MCG (1000 UT) tablet Take 1,000 Units by mouth daily.    . colestipol (COLESTID) 1 g tablet Take 0.5-1 g by mouth daily. Pt taking 1/2 to 1 tablet daily depending on meal choice; taken 1 hour after morning meds , and  can not eat for another hour    . eszopiclone (LUNESTA) 2 MG TABS tablet Take immediately before bedtime 90 tablet 0  . FLUoxetine (PROZAC) 10 MG capsule Take 10 mg by mouth every morning. Takes with a 5m capsule for her total dose of 393m    . Marland KitchenLUoxetine (PROZAC) 20 MG capsule Take 20 mg by mouth every morning. Takes with a 101mapsule for her total dose of 49m64m  . lidocaine-prilocaine (EMLA) cream Apply 1 application topically as needed. 30 g 1  . MELATONIN PO Take 10 mg by mouth at bedtime.     .  metFORMIN (GLUCOPHAGE-XR) 500 MG 24 hr tablet Take 500 mg by mouth daily with breakfast.   3  . metoprolol tartrate (LOPRESSOR) 25 MG tablet Take 1 tablet (25 mg total) by mouth 2 (two) times daily. 60 tablet 3  . Multiple Vitamin (MULTIVITAMIN WITH MINERALS) TABS Take 1 tablet by mouth every morning.     . ondansetron (ZOFRAN) 8 MG tablet Take 1 tablet (8 mg total) by mouth every 8 (eight) hours as needed for refractory nausea / vomiting. Start on day 3 after carboplatin chemo. 30 tablet 1  . prochlorperazine (COMPAZINE) 10 MG tablet Take 1 tablet (10 mg total) by mouth every 6 (six) hours as needed (Nausea or vomiting). 30 tablet 1  . rosuvastatin (CRESTOR) 20 MG tablet Take 20 mg by mouth every morning.     . teMarland Kitchenmisartan (MICARDIS) 40 MG tablet Take 40 mg by mouth daily.     No current facility-administered medications for this visit.   Facility-Administered Medications Ordered in Other Visits  Medication Dose Route Frequency Provider Last Rate Last Admin  . sodium chloride flush (NS) 0.9 % injection 10 mL  10 mL Intravenous PRN GehrNancy Marus   10 mL at 05/12/17 0823  . sodium chloride flush (NS) 0.9 % injection 10 mL  10 mL Intracatheter PRN GorsAlvy Bimler, MD   10 mL at 08/20/20 1018    PHYSICAL EXAMINATION: ECOG PERFORMANCE STATUS: 0 - Asymptomatic  Vitals:   08/20/20 0812  BP: 136/80  Pulse: 68  Resp: 16  Temp: 97.6 F (36.4 C)  SpO2: 100%   Filed Weights   08/20/20 0812  Weight: 197 lb 6.4 oz (89.5 kg)    GENERAL:alert, no distress and comfortable SKIN: skin color, texture, turgor are normal, no rashes or significant lesions EYES: normal, Conjunctiva are pink and non-injected, sclera clear OROPHARYNX:no exudate, no erythema and lips, buccal mucosa, and tongue normal  NECK: supple, thyroid normal size, non-tender, without nodularity LYMPH:  no palpable lymphadenopathy in the cervical, axillary or inguinal LUNGS: clear to auscultation and percussion with normal breathing  effort HEART: regular rate & rhythm and no murmurs and no lower extremity edema ABDOMEN:abdomen soft, non-tender and normal bowel sounds Musculoskeletal:no cyanosis of digits and no clubbing  NEURO: alert & oriented x 3 with fluent speech, no focal motor/sensory deficits  LABORATORY DATA:  I have reviewed the data as listed    Component Value Date/Time   NA 140 08/20/2020 0752   NA 141 12/02/2016 1219   K 3.9 08/20/2020 0752   K 3.7 12/02/2016 1219   CL 103 08/20/2020 0752   CL 103 01/18/2013 1329   CO2 26 08/20/2020 0752   CO2 27 12/02/2016 1219   GLUCOSE 115 (H) 08/20/2020 0752   GLUCOSE 98 12/02/2016 1219   GLUCOSE 110 (H) 01/18/2013 1329   BUN 14 08/20/2020 0752   BUN 15.3 12/02/2016  1219   CREATININE 0.73 08/20/2020 0752   CREATININE 0.73 07/30/2020 0716   CREATININE 0.7 12/02/2016 1219   CALCIUM 9.9 08/20/2020 0752   CALCIUM 10.1 12/02/2016 1219   PROT 7.1 08/20/2020 0752   PROT 7.1 12/02/2016 1219   ALBUMIN 4.1 08/20/2020 0752   ALBUMIN 4.2 12/02/2016 1219   AST 31 08/20/2020 0752   AST 29 07/30/2020 0716   AST 21 12/02/2016 1219   ALT 41 08/20/2020 0752   ALT 37 07/30/2020 0716   ALT 27 12/02/2016 1219   ALKPHOS 84 08/20/2020 0752   ALKPHOS 104 12/02/2016 1219   BILITOT 0.6 08/20/2020 0752   BILITOT 0.5 07/30/2020 0716   BILITOT 0.37 12/02/2016 1219   GFRNONAA >60 08/20/2020 0752   GFRNONAA >60 07/30/2020 0716   GFRAA >60 05/07/2020 0833    No results found for: SPEP, UPEP  Lab Results  Component Value Date   WBC 5.6 08/20/2020   NEUTROABS 3.0 08/20/2020   HGB 13.9 08/20/2020   HCT 42.3 08/20/2020   MCV 91.4 08/20/2020   PLT 136 (L) 08/20/2020      Chemistry      Component Value Date/Time   NA 140 08/20/2020 0752   NA 141 12/02/2016 1219   K 3.9 08/20/2020 0752   K 3.7 12/02/2016 1219   CL 103 08/20/2020 0752   CL 103 01/18/2013 1329   CO2 26 08/20/2020 0752   CO2 27 12/02/2016 1219   BUN 14 08/20/2020 0752   BUN 15.3 12/02/2016 1219    CREATININE 0.73 08/20/2020 0752   CREATININE 0.73 07/30/2020 0716   CREATININE 0.7 12/02/2016 1219   GLU 236 12/13/2012 1558      Component Value Date/Time   CALCIUM 9.9 08/20/2020 0752   CALCIUM 10.1 12/02/2016 1219   ALKPHOS 84 08/20/2020 0752   ALKPHOS 104 12/02/2016 1219   AST 31 08/20/2020 0752   AST 29 07/30/2020 0716   AST 21 12/02/2016 1219   ALT 41 08/20/2020 0752   ALT 37 07/30/2020 0716   ALT 27 12/02/2016 1219   BILITOT 0.6 08/20/2020 0752   BILITOT 0.5 07/30/2020 0716   BILITOT 0.37 12/02/2016 1219       RADIOGRAPHIC STUDIES: I have personally reviewed the radiological images as listed and agreed with the findings in the report. MR THORACIC SPINE W WO CONTRAST  Result Date: 08/01/2020 CLINICAL DATA:  Worsening pain under the right breast. History of ovarian cancer. EXAM: MRI THORACIC AND LUMBAR SPINE WITHOUT AND WITH CONTRAST TECHNIQUE: Multiplanar and multiecho pulse sequences of the thoracic and lumbar spine were obtained without and with intravenous contrast. CONTRAST:  75m GADAVIST GADOBUTROL 1 MMOL/ML IV SOLN COMPARISON:  Thoracic MRI 08/30/2017 FINDINGS: MRI THORACIC SPINE FINDINGS Alignment: Exaggerated thoracic kyphosis with slight T2-3 anterolisthesis associated with facet degeneration. Vertebrae: Hemangioma in the T8 vertebral body. No fracture or aggressive bone lesion. Cord: Normal signal and morphology. No abnormal intrathecal mass effect or enhancement. Paraspinal and other soft tissues: No perispinal mass or inflammation seen. Disc levels: Generalized disc narrowing and desiccation with small ventral endplate spurs. Small disc protrusions are seen at T5-6, T6-7, and right paracentral at T9-10. At T5-6 and T9-10 the herniations contact the cord without compression. Degenerative facet spurring mainly seen at T2-3 and T3-4, greater on the right were spurs cause mild right foraminal narrowing. MRI LUMBAR SPINE FINDINGS Segmentation:  5 lumbar type vertebrae  Alignment:  Unremarkable Vertebrae: Chronic left-sided pars defect at L5. No evidence of fracture, discitis, or aggressive bone  lesion. Conus medullaris: Extends to the L1 level and appears normal. Paraspinal and other soft tissues: No perispinal mass or inflammation seen Disc levels: T12- L1: Unremarkable. L1-L2: Unremarkable. L2-L3: Disc bulging especially into the inferior foramina, greater on the right. Mild facet spurring. Mild right subarticular recess narrowing L3-L4: Disc narrowing and bulging especially into the right more than left inferior foramina. The canal and foramina are patent L4-L5: Facet osteoarthritis with spurring and anterolisthesis. Disc degeneration with narrowing and circumferential bulging. Noncompressive bilateral foraminal stenosis. Triangular narrowing the thecal sac without clear nerve root compression L5-S1:Disc narrowing with asymmetric right-sided height loss, bulging, and endplate spurring. Right facet osteoarthritis. Moderate right foraminal impingement. IMPRESSION: Thoracic spine: 1. Negative for metastatic disease. 2. Small noncompressive disc protrusions as described. 3. 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 Lumbar spine: 1. Negative for metastatic disease. 2. Disc and facet degeneration with progresses inferiorly and causes mild spinal stenosis at L4-5 and moderate foraminal stenosis on the right at L5-S1. Electronically Signed   By: Monte Fantasia M.D.   On: 08/01/2020 08:56   MR Lumbar Spine W Wo Contrast  Result Date: 08/01/2020 CLINICAL DATA:  Worsening pain under the right breast. History of ovarian cancer. EXAM: MRI THORACIC AND LUMBAR SPINE WITHOUT AND WITH CONTRAST TECHNIQUE: Multiplanar and multiecho pulse sequences of the thoracic and lumbar spine were obtained without and with intravenous contrast. CONTRAST:  50m GADAVIST GADOBUTROL 1 MMOL/ML IV SOLN COMPARISON:  Thoracic MRI 08/30/2017  FINDINGS: MRI THORACIC SPINE FINDINGS Alignment: Exaggerated thoracic kyphosis with slight T2-3 anterolisthesis associated with facet degeneration. Vertebrae: Hemangioma in the T8 vertebral body. No fracture or aggressive bone lesion. Cord: Normal signal and morphology. No abnormal intrathecal mass effect or enhancement. Paraspinal and other soft tissues: No perispinal mass or inflammation seen. Disc levels: Generalized disc narrowing and desiccation with small ventral endplate spurs. Small disc protrusions are seen at T5-6, T6-7, and right paracentral at T9-10. At T5-6 and T9-10 the herniations contact the cord without compression. Degenerative facet spurring mainly seen at T2-3 and T3-4, greater on the right were spurs cause mild right foraminal narrowing. MRI LUMBAR SPINE FINDINGS Segmentation:  5 lumbar type vertebrae Alignment:  Unremarkable Vertebrae: Chronic left-sided pars defect at L5. No evidence of fracture, discitis, or aggressive bone lesion. Conus medullaris: Extends to the L1 level and appears normal. Paraspinal and other soft tissues: No perispinal mass or inflammation seen Disc levels: T12- L1: Unremarkable. L1-L2: Unremarkable. L2-L3: Disc bulging especially into the inferior foramina, greater on the right. Mild facet spurring. Mild right subarticular recess narrowing L3-L4: Disc narrowing and bulging especially into the right more than left inferior foramina. The canal and foramina are patent L4-L5: Facet osteoarthritis with spurring and anterolisthesis. Disc degeneration with narrowing and circumferential bulging. Noncompressive bilateral foraminal stenosis. Triangular narrowing the thecal sac without clear nerve root compression L5-S1:Disc narrowing with asymmetric right-sided height loss, bulging, and endplate spurring. Right facet osteoarthritis. Moderate right foraminal impingement. IMPRESSION: Thoracic spine: 1. Negative for metastatic disease. 2. Small noncompressive disc protrusions as  described. 3. 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 Lumbar spine: 1. Negative for metastatic disease. 2. Disc and facet degeneration with progresses inferiorly and causes mild spinal stenosis at L4-5 and moderate foraminal stenosis on the right at L5-S1. Electronically Signed   By: JMonte FantasiaM.D.   On: 08/01/2020 08:56

## 2020-08-20 NOTE — Patient Instructions (Signed)
[facility] Discharge Instructions for Patients Receiving Chemotherapy  Today you received the following chemotherapy agent: Bevacizumab   To help prevent nausea and vomiting after your treatment, we encourage you to take your nausea medication as directed by your MD.   If you develop nausea and vomiting that is not controlled by your nausea medication, call the clinic.   BELOW ARE SYMPTOMS THAT SHOULD BE REPORTED IMMEDIATELY:  *FEVER GREATER THAN 100.5 F  *CHILLS WITH OR WITHOUT FEVER  NAUSEA AND VOMITING THAT IS NOT CONTROLLED WITH YOUR NAUSEA MEDICATION  *UNUSUAL SHORTNESS OF BREATH  *UNUSUAL BRUISING OR BLEEDING  TENDERNESS IN MOUTH AND THROAT WITH OR WITHOUT PRESENCE OF ULCERS  *URINARY PROBLEMS  *BOWEL PROBLEMS  UNUSUAL RASH Items with * indicate a potential emergency and should be followed up as soon as possible.  Feel free to call the clinic should you have any questions or concerns. The clinic phone number is [phone].  Please show the CHEMO ALERT CARD at check-in to the Emergency Department and triage nurse.   

## 2020-08-20 NOTE — Assessment & Plan Note (Signed)
Her blood pressure control fluctuates up and down Per previous discussion, her primary care doctor will manage her blood pressure medications from now on 

## 2020-08-20 NOTE — Assessment & Plan Note (Signed)
Her thrombocytopenia is stable Observe only for now 

## 2020-08-20 NOTE — Assessment & Plan Note (Signed)
Her last CT imaging showed no signs of cancer She has achieved complete response We discussed the role of maintenance therapy and she would like to continue I will see her back in 3 weeks for further follow-up Her next imaging study would be in March 2022 

## 2020-08-29 ENCOUNTER — Encounter: Payer: Self-pay | Admitting: Family Medicine

## 2020-08-29 ENCOUNTER — Telehealth: Payer: Self-pay | Admitting: Neurology

## 2020-08-29 DIAGNOSIS — G4733 Obstructive sleep apnea (adult) (pediatric): Secondary | ICD-10-CM | POA: Diagnosis not present

## 2020-08-29 NOTE — Telephone Encounter (Signed)
PA completed over [facility] for lunesta 2 mg. Pt has tried failed belsomra,trazodone and ambien. Pt has been taking med for years and should remain.  KEY: [id number]  Approved, Coverage Starts on: 08/10/2020 12:00:00 AM, Coverage Ends on: 08/09/2021

## 2020-09-06 DIAGNOSIS — G47419 Narcolepsy without cataplexy: Secondary | ICD-10-CM | POA: Diagnosis not present

## 2020-09-06 DIAGNOSIS — E1159 Type 2 diabetes mellitus with other circulatory complications: Secondary | ICD-10-CM | POA: Diagnosis not present

## 2020-09-06 DIAGNOSIS — Z79899 Other long term (current) drug therapy: Secondary | ICD-10-CM | POA: Diagnosis not present

## 2020-09-06 DIAGNOSIS — K529 Noninfective gastroenteritis and colitis, unspecified: Secondary | ICD-10-CM | POA: Diagnosis not present

## 2020-09-06 DIAGNOSIS — M797 Fibromyalgia: Secondary | ICD-10-CM | POA: Diagnosis not present

## 2020-09-06 DIAGNOSIS — G4733 Obstructive sleep apnea (adult) (pediatric): Secondary | ICD-10-CM | POA: Diagnosis not present

## 2020-09-06 DIAGNOSIS — I10 Essential (primary) hypertension: Secondary | ICD-10-CM | POA: Diagnosis not present

## 2020-09-06 DIAGNOSIS — M85851 Other specified disorders of bone density and structure, right thigh: Secondary | ICD-10-CM | POA: Diagnosis not present

## 2020-09-06 DIAGNOSIS — F411 Generalized anxiety disorder: Secondary | ICD-10-CM | POA: Diagnosis not present

## 2020-09-06 DIAGNOSIS — Z0000 Encounter for general adult medical examination without abnormal findings: Secondary | ICD-10-CM | POA: Diagnosis not present

## 2020-09-06 DIAGNOSIS — C5700 Malignant neoplasm of unspecified fallopian tube: Secondary | ICD-10-CM | POA: Diagnosis not present

## 2020-09-06 DIAGNOSIS — E782 Mixed hyperlipidemia: Secondary | ICD-10-CM | POA: Diagnosis not present

## 2020-09-06 DIAGNOSIS — E559 Vitamin D deficiency, unspecified: Secondary | ICD-10-CM | POA: Diagnosis not present

## 2020-09-09 ENCOUNTER — Other Ambulatory Visit: Payer: Self-pay | Admitting: Neurology

## 2020-09-09 MED ORDER — ARMODAFINIL 250 MG PO TABS
250.0000 mg | ORAL_TABLET | Freq: Every day | ORAL | 0 refills | Status: DC
Start: 2020-09-09 — End: 2020-12-18

## 2020-09-11 ENCOUNTER — Inpatient Hospital Stay: Payer: Medicare HMO

## 2020-09-11 ENCOUNTER — Encounter: Payer: Self-pay | Admitting: Hematology and Oncology

## 2020-09-11 ENCOUNTER — Inpatient Hospital Stay: Payer: Medicare HMO | Attending: Gynecologic Oncology

## 2020-09-11 ENCOUNTER — Other Ambulatory Visit: Payer: Self-pay

## 2020-09-11 ENCOUNTER — Inpatient Hospital Stay: Payer: Medicare HMO | Admitting: Hematology and Oncology

## 2020-09-11 DIAGNOSIS — C786 Secondary malignant neoplasm of retroperitoneum and peritoneum: Secondary | ICD-10-CM | POA: Insufficient documentation

## 2020-09-11 DIAGNOSIS — Z7189 Other specified counseling: Secondary | ICD-10-CM

## 2020-09-11 DIAGNOSIS — G8929 Other chronic pain: Secondary | ICD-10-CM | POA: Diagnosis not present

## 2020-09-11 DIAGNOSIS — C5700 Malignant neoplasm of unspecified fallopian tube: Secondary | ICD-10-CM

## 2020-09-11 DIAGNOSIS — C5701 Malignant neoplasm of right fallopian tube: Secondary | ICD-10-CM

## 2020-09-11 DIAGNOSIS — M549 Dorsalgia, unspecified: Secondary | ICD-10-CM | POA: Insufficient documentation

## 2020-09-11 DIAGNOSIS — Z5112 Encounter for antineoplastic immunotherapy: Secondary | ICD-10-CM | POA: Diagnosis not present

## 2020-09-11 DIAGNOSIS — R971 Elevated cancer antigen 125 [CA 125]: Secondary | ICD-10-CM | POA: Insufficient documentation

## 2020-09-11 DIAGNOSIS — Z9221 Personal history of antineoplastic chemotherapy: Secondary | ICD-10-CM | POA: Insufficient documentation

## 2020-09-11 DIAGNOSIS — G4700 Insomnia, unspecified: Secondary | ICD-10-CM | POA: Insufficient documentation

## 2020-09-11 DIAGNOSIS — D696 Thrombocytopenia, unspecified: Secondary | ICD-10-CM | POA: Diagnosis not present

## 2020-09-11 DIAGNOSIS — Z90722 Acquired absence of ovaries, bilateral: Secondary | ICD-10-CM | POA: Diagnosis not present

## 2020-09-11 LAB — COMPREHENSIVE METABOLIC PANEL
ALT: 30 U/L (ref 0–44)
AST: 25 U/L (ref 15–41)
Albumin: 4.1 g/dL (ref 3.5–5.0)
Alkaline Phosphatase: 82 U/L (ref 38–126)
Anion gap: 9 (ref 5–15)
BUN: 21 mg/dL (ref 8–23)
CO2: 28 mmol/L (ref 22–32)
Calcium: 9.2 mg/dL (ref 8.9–10.3)
Chloride: 103 mmol/L (ref 98–111)
Creatinine, Ser: 0.9 mg/dL (ref 0.44–1.00)
GFR, Estimated: >60 mL/min (ref >60)
Glucose, Bld: 113 mg/dL — ABNORMAL HIGH (ref 70–99)
Potassium: 4.2 mmol/L (ref 3.5–5.1)
Sodium: 140 mmol/L (ref 135–145)
Total Bilirubin: 0.4 mg/dL (ref 0.3–1.2)
Total Protein: 6.9 g/dL (ref 6.5–8.1)

## 2020-09-11 LAB — CBC WITH DIFFERENTIAL/PLATELET
Abs Immature Granulocytes: 0.02 10*3/uL (ref 0.00–0.07)
Basophils Absolute: 0 10*3/uL (ref 0.0–0.1)
Basophils Relative: 1 %
Eosinophils Absolute: 0.1 10*3/uL (ref 0.0–0.5)
Eosinophils Relative: 3 %
HCT: 41.2 % (ref 36.0–46.0)
Hemoglobin: 13.7 g/dL (ref 12.0–15.0)
Immature Granulocytes: 0 %
Lymphocytes Relative: 34 %
Lymphs Abs: 1.7 10*3/uL (ref 0.7–4.0)
MCH: 30.3 pg (ref 26.0–34.0)
MCHC: 33.3 g/dL (ref 30.0–36.0)
MCV: 91.2 fL (ref 80.0–100.0)
Monocytes Absolute: 0.6 10*3/uL (ref 0.1–1.0)
Monocytes Relative: 11 %
Neutro Abs: 2.6 10*3/uL (ref 1.7–7.7)
Neutrophils Relative %: 51 %
Platelets: 130 10*3/uL — ABNORMAL LOW (ref 150–400)
RBC: 4.52 MIL/uL (ref 3.87–5.11)
RDW: 12.9 % (ref 11.5–15.5)
WBC: 5.1 10*3/uL (ref 4.0–10.5)
nRBC: 0 % (ref 0.0–0.2)

## 2020-09-11 LAB — TOTAL PROTEIN, URINE DIPSTICK: Protein, ur: NEGATIVE mg/dL

## 2020-09-11 MED ORDER — HEPARIN SOD (PORK) LOCK FLUSH 100 UNIT/ML IV SOLN
500.0000 [IU] | Freq: Once | INTRAVENOUS | Status: AC | PRN
  Administered 2020-09-11: 500 [IU]
  Filled 2020-09-11: qty 5

## 2020-09-11 MED ORDER — SODIUM CHLORIDE 0.9 % IV SOLN
Freq: Once | INTRAVENOUS | Status: AC
  Filled 2020-09-11: qty 250

## 2020-09-11 MED ORDER — SODIUM CHLORIDE 0.9 % IV SOLN
15.0000 mg/kg | Freq: Once | INTRAVENOUS | Status: AC
  Administered 2020-09-11: 1300 mg via INTRAVENOUS
  Filled 2020-09-11: qty 48

## 2020-09-11 MED ORDER — SODIUM CHLORIDE 0.9% FLUSH
10.0000 mL | Freq: Once | INTRAVENOUS | Status: AC
  Administered 2020-09-11: 10 mL
  Filled 2020-09-11: qty 10

## 2020-09-11 MED ORDER — SODIUM CHLORIDE 0.9% FLUSH
10.0000 mL | INTRAVENOUS | Status: DC | PRN
Start: 2020-09-11 — End: 2020-09-11
  Administered 2020-09-11: 10 mL
  Filled 2020-09-11: qty 10

## 2020-09-11 NOTE — Assessment & Plan Note (Signed)
Her last CT imaging showed no signs of cancer She has achieved complete response We discussed the role of maintenance therapy and she would like to continue I will see her back in 3 weeks for further follow-up Her next imaging study would be in March 2022 

## 2020-09-11 NOTE — Assessment & Plan Note (Signed)
She was seen for evaluation of back pain MRI has been ordered for further evaluation, benign  She will continue her best effort to lose weight Her surgeon or primary doctor can refer her to see physical therapy

## 2020-09-11 NOTE — Patient Instructions (Signed)
Implanted Port Insertion, Care After This sheet gives you information about how to care for yourself after your procedure. Your health care provider may also give you more specific instructions. If you have problems or questions, contact your health care provider. What can I expect after the procedure? After the procedure, it is common to have:  Discomfort at the port insertion site.  Bruising on the skin over the port. This should improve over 3-4 days. Follow these instructions at home: Port care  After your port is placed, you will get a manufacturer's information card. The card has information about your port. Keep this card with you at all times.  Take care of the port as told by your health care provider. Ask your health care provider if you or a family member can get training for taking care of the port at home. A home health care nurse may also take care of the port.  Make sure to remember what type of port you have. Incision care  Follow instructions from your health care provider about how to take care of your port insertion site. Make sure you: ? Wash your hands with soap and water before and after you change your bandage (dressing). If soap and water are not available, use hand sanitizer. ? Change your dressing as told by your health care provider. ? Leave stitches (sutures), skin glue, or adhesive strips in place. These skin closures may need to stay in place for 2 weeks or longer. If adhesive strip edges start to loosen and curl up, you may trim the loose edges. Do not remove adhesive strips completely unless your health care provider tells you to do that.  Check your port insertion site every day for signs of infection. Check for: ? Redness, swelling, or pain. ? Fluid or blood. ? Warmth. ? Pus or a bad smell.      Activity  Return to your normal activities as told by your health care provider. Ask your health care provider what activities are safe for you.  Do not  lift anything that is heavier than 10 lb (4.5 kg), or the limit that you are told, until your health care provider says that it is safe. General instructions  Take over-the-counter and prescription medicines only as told by your health care provider.  Do not take baths, swim, or use a hot tub until your health care provider approves. Ask your health care provider if you may take showers. You may only be allowed to take sponge baths.  Do not drive for 24 hours if you were given a sedative during your procedure.  Wear a medical alert bracelet in case of an emergency. This will tell any health care providers that you have a port.  Keep all follow-up visits as told by your health care provider. This is important. Contact a health care provider if:  You cannot flush your port with saline as directed, or you cannot draw blood from the port.  You have a fever or chills.  You have redness, swelling, or pain around your port insertion site.  You have fluid or blood coming from your port insertion site.  Your port insertion site feels warm to the touch.  You have pus or a bad smell coming from the port insertion site. Get help right away if:  You have chest pain or shortness of breath.  You have bleeding from your port that you cannot control. Summary  Take care of the port as told by your   health care provider. Keep the manufacturer's information card with you at all times.  Change your dressing as told by your health care provider.  Contact a health care provider if you have a fever or chills or if you have redness, swelling, or pain around your port insertion site.  Keep all follow-up visits as told by your health care provider. This information is not intended to replace advice given to you by your health care provider. Make sure you discuss any questions you have with your health care provider. Document Revised: 02/22/2018 Document Reviewed: 02/22/2018 Elsevier Patient Education   2021 [employer]  

## 2020-09-11 NOTE — Assessment & Plan Note (Signed)
Her thrombocytopenia is stable Observe only for now 

## 2020-09-11 NOTE — Patient Instructions (Signed)
[facility] Discharge Instructions for Patients Receiving Chemotherapy  Today you received the following chemotherapy agents: Bevacizumab (Avastin).  To help prevent nausea and vomiting after your treatment, we encourage you to take your nausea medication as prescribed.  If you develop nausea and vomiting that is not controlled by your nausea medication, call the clinic.   BELOW ARE SYMPTOMS THAT SHOULD BE REPORTED IMMEDIATELY:  *FEVER GREATER THAN 100.5 F  *CHILLS WITH OR WITHOUT FEVER  NAUSEA AND VOMITING THAT IS NOT CONTROLLED WITH YOUR NAUSEA MEDICATION  *UNUSUAL SHORTNESS OF BREATH  *UNUSUAL BRUISING OR BLEEDING  TENDERNESS IN MOUTH AND THROAT WITH OR WITHOUT PRESENCE OF ULCERS  *URINARY PROBLEMS  *BOWEL PROBLEMS  UNUSUAL RASH Items with * indicate a potential emergency and should be followed up as soon as possible.  Feel free to call the clinic should you have any questions or concerns. The clinic phone number is [phone].  Please show the CHEMO ALERT CARD at check-in to the Emergency Department and triage nurse.   

## 2020-09-11 NOTE — Progress Notes (Signed)
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 signs of cancer She has achieved complete response We discussed the role of maintenance therapy and she would like to continue I will see her back in 3 weeks for further follow-up Her next imaging study would be in March 2022  Thrombocytopenia [facility]) Her thrombocytopenia is stable Observe only for now  Chronic back pain greater than 3 months duration She was seen for evaluation of back pain MRI has been ordered for further evaluation, benign  She will continue her best effort to lose weight Her surgeon or primary doctor can refer her to see physical therapy    No orders of the defined types were placed in this encounter.   All questions were answered. The patient knows to call the clinic with any problems, questions or concerns. The total time spent in the appointment was 20 minutes encounter with patients including review of chart and various tests results, discussions about plan of care and coordination of care plan   [name], MD 09/11/2020 10:46 AM  INTERVAL HISTORY: Please see below for problem oriented charting. She returns for treatment and follow-up She is doing well Blood pressure is under good control She still have chronic back pain but that is stable She is attempting to lose some weight No recent infusion reaction  SUMMARY OF ONCOLOGIC HISTORY: Oncology History Overview Note  Oncologic Summary: 1. 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 Random CA 125  elevation January 2019  Question mesenteric nodules and anterior abdominal wall nodule 3. GeneDx Breast/Ovary Panel negative (including BRCA, MMR's, RAD51 etc) ? [facility] BRACAnalysis  Negative for BRCA1/2 in tumor   Fallopian tube carcinoma ([place])  11/01/2012 Imaging   Ct abdomen 1.  Interval development of large mid abdominal mass highly concerning for right ovarian cancer.  There is mild omental nodularity on the left, and peritoneal disease cannot be completely excluded.  There is no ascites or other evidence of metastatic disease. 2.  Mild associated renal pelvocaliectasis bilaterally without obstruction.  Nonobstructing left renal calculus and a small right renal angiomyolipoma noted incidentally.   11/08/2012 Pathology Results   1. 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 - BENIGN FALLOPIAN TUBE WITH NONSPECIFIC CHRONIC INFLAMMATION. 2. Ovary and fallopian tube, left - BENIGN OVARY; NEGATIVE FOR ATYPIA OR MALIGNANCY. - BENIGN FALLOPIAN TUBE; NEGATIVE FOR ATYPIA OR MALIGNANCY. 3. Omentum, resection for tumor - HIGH GRADE CARCINOMA, SEE COMMENT. 4. Appendix, Other than Incidental - FIBROUS OBLITERATION OF APPENDICEAL TIP. - NEGATIVE FOR MALIGNANCY.   11/08/2012 Surgery   Surgery: Exploratory laparotomy, bilateral salpingo-oophorectomy, appendectomy, infacolic omentectomy, optimal debulking  Surgeons:  Paola A. Alycia Rossetti, MD; Lahoma Crocker, MD   Assistant: Caswell Corwin  Pathology: Bilateral fallopian tubes and ovaries to pathology. Appendix as well as omentum. 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  Operative findings: 25 cm right adnexal mass with smooth surface. Surgically absent uterus. Atrophic-appearing  left ovary. Normal appearing  appendix. Within the omentum there were centimeter nodules scattered throughout the omentum. The remainder of the surfaces were benign.   12/08/2012 Procedure   Impression:  Placement of a subcutaneous port device.  The catheter tip is in the lower SVC and ready to be used.    12/13/2012 - 03/28/2013 Chemotherapy   s/p 6 cycles of paclitaxel and carboplatin   12/13/2012 - 03/28/2013 Chemotherapy   The patient had 6 cycles of carboplatin and Taxol   03/24/2013 Imaging   US abdomen   04/24/2013 Imaging   CT abdomen Interval resection of the large right pelvic and lower abdominal mass lesion with apparent omentectomy.  No evidence for intraperitoneal free fluid on today's study.  No discernible peritoneal lesions.  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   09/07/2013 Genetic Testing   Patient has genetic testing done for BRCA1/2 panel Results revealed patient has no mutation(s):   07/09/2015 Imaging   CT abdomen 1. 12 mm obstructive calculus at the left ureteropelvic junction with moderate proximal hydronephrosis. 2. 2 small supraumbilical ventral hernias, one containing a short segment of the mid transverse colon and the other containing a short segment of the mid small bowel. There is no associated evidence to suggest bowel incarceration or obstruction at this time. 3. Tiny locule of gas non dependently in the lumen of the urinary bladder. This is presumably iatrogenic related to recent catheterization for urinalysis. Alternatively, this could be seen in the setting of urinary tract infection with gas-forming organisms. Clinical correlation for history of recent catheterization is recommended. 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 5. Status post cholecystectomy. 6. Additional incidental findings, as above.    12/11/2016 Imaging   Ct abdomen 1. No evidence of metastatic ovarian cancer. 2. Recurrent subxiphoid ventral abdominal wall hernia containing  transverse colon. No evidence of incarceration or obstruction. 3. Stable incidental findings in the liver and kidneys. No recurrent urinary tract calculus. 4. Progressive lower lumbar spondylosis. 5.  Aortic Atherosclerosis (ICD10-I70.0).    08/30/2017 Imaging   MRI thoracic spine 1. At T5-6 there is a small central disc protrusion contacting the ventral thoracic spinal cord. No central canal or foraminal stenosis. 2. At T9-10 there is a small right paracentral disc protrusion. 3.  No acute osseous injury of the thoracic spine. 4. No aggressive osseous lesion to suggest metastatic disease.   09/24/2017 Tumor Marker   Patient's tumor was tested for the following markers: CA-125 Results of the tumor marker test revealed 21.7   09/30/2017 Imaging   CT abdomen 1. New small clustered soft tissue nodules in the left lower quadrant in the sigmoid mesentery, largest 1.0 cm, which could represent recurrent peritoneal tumor implants. No ascites.  2. Midline high ventral abdominal wall hernia containing a portion of the transverse colon is mildly increased in size, and without bowel complication at this time. 3. Chronic findings include: Aortic Atherosclerosis (ICD10-I70.0). Diffuse hepatic steatosis. 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   10/11/2017 PET scan   1. Nodules in the sigmoid mesentery are hypermetabolic and highly worrisome for metastatic disease. 2. Attic steatosis.    11/03/2017 Procedure   Successful CT-guided rectus abdominal muscle mass core biopsy. Path: - FOREIGN BODY GIANT CELL REACTION INVOLVING FIBROADIPOSE TISSUE AND SKELETAL MUSCLE. - NO EVIDENCE OF MALIGNANCY.   11/04/2017 Cancer Staging   Staging form: Fallopian Tube, AJCC 7th Edition - Clinical: FIGO Stage IIIC, calculated as Stage  IV (rT3, N0, M1) - Signed by [name], MD on 08/09/2019   02/2018 Imaging   CT: 1. Continued increase in size small peritoneal  nodules along the mesenteric border of the proximal sigmoid colon as well as along the serosal surface of the proximal sigmoid colon. 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 2. No evidence of distant disease. 3. Stable large ventral hernia.   05/2018 Imaging   PET: 1. Redemonstration of hypermetabolic nodules within the sigmoid mesocolon. Mild response to therapy relative to CT of 02/24/2018. Mixed response to therapy compared to the most recent PET of 10/11/2017. 2. Hypermetabolism within the right pelvic rectus musculature, increased since the prior PET.  Clinical service requested comparison to the 11/24/2017 CT. Index 10 mm nodule within the sigmoid mesocolon was similar to the 02/24/2018 CT, and as described on that exam, increased from 7 mm on 11/24/2017. More inferior nodule within the mesocolon measures 12 mm today on image 156/4 and 8 mm on 11/24/2017.   05/2018 Imaging   CT: IMPRESSION: 1. Since 02/24/2018, decreased size of peritoneal nodules centered in the sigmoid mesocolon. 2. No evidence of new or progressive disease. 3. Hepatic steatosis. 4. Subcentimeter right renal angiomyolipoma, similar. 5.  Aortic Atherosclerosis (ICD10-I70.0). 6. Ventral abdominal wall laxity containing transverse colon, similar.   08/2018 Imaging   CT: IMPRESSION: 1. Nodules within the sigmoid mesocolon have decreased in size compared to prior. No new peritoneal or omental nodularity. 2. No evidence local recurrence the pelvis. 3. Ventral hernia contains a segment of transverse colon. No change from prior.     06/19/2019 Imaging   1. No evidence of metastatic disease in the abdomen pelvis. 2. No peritoneal or omental metastasis identified.  No free fluid. 3. Postcholecystectomy and hysterectomy. Comparison exams are made available. Comparison CT 09/08/2018 and 05/27/2018. PET-CT 05/27/2018    There is a nodule within the proximal aspect of the sigmoid colon  measuring 2.2 by 2.2 cm. In comparison to prior CTs and PET-CT there was a hypermetabolic nodule at this location on the PET-CT of 08/27/2017 and on the CT of 09/08/2018 there was a small residual nodule. At that time (09/08/2018) the nodule measured 1.4 by 1.3 cm. Therefore this nodule has increased in the interval and concerning for recurrence of a serosal implant. The previous described lymph nodes in the sigmoid mesocolon and more central mesentery are not increased in size and not pathologic by size criteria.    Concern for recurrence of serosal metastasis in the proximal sigmoid colon with a 2 cm enlarging lesion. Lesion extends into the lumen. No evidence of high-grade obstruction. Consider FDG PET scan and/or potential colonoscopy for evaluation.   06/29/2019 PET scan   1. Enlarging serosal implant within the proximal sigmoid colon has intense metabolic activity most consistent with malignancy. Lesion exhibits a somewhat indolent progression as present on PET-CT scan from 10/11/2017 and 05/27/2018. 2. Focal hypermetabolic activity within the RIGHT rectus muscle just off midline is slightly decreased from comparison exam. This may be benign inflammation related to prior laparotomy, however malignancy not excluded.   07/26/2019 Procedure   She had colonoscopy which showed multiple polyps.  6 polyps were removed from the cecum, measuring 3 to 6 mm in size.  One 12 mm polyp was removed from ascending colon and another 1 measures 7 mm.  One 5 mm polyp was removed from the transverse colon.  There is tumor noted in the sigmoid colon, approximately 35 cm from the anus which  was biopsied.  The tumor appeared to be fungating, infiltrative and ulcerated but nonobstructive.  It encompassed approximately one third of the circumference of the lumen.   07/26/2019 Pathology Results   Multiple polyps came back tubular adenomas.  2 ascending polyps came back sessile serrated adenoma months.  Sigmoid colon  biopsy confirmed metastatic high-grade serous carcinoma.  The morphology and immunophenotype are consistent with metastatic high-grade serous carcinoma from tubal/ovarian primary site.   08/17/2019 - 12/12/2019 Chemotherapy   The patient had carboplatin and taxol   10/23/2019 Imaging   1. Sigmoid lesion with diminished size/conspicuity difficult to measure on the previous exam. Adjacent lymph nodes and nodularity less than a cm, largest on coronal image 48 measuring 5 mm. 2. Right rectus muscle with some thickening with mildly convex margin seen posteriorly on image 72 of series 2. This could be due to postsurgical change, however, metastatic disease is not excluded. Attention on follow-up is suggested. 3. No evidence for new metastatic disease. 4. Small hiatal hernia. 5. 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   Aortic Atherosclerosis (ICD10-I70.0).   01/01/2020 Imaging   1. No signs of new disease. 2. Tiny lymph nodes in the area of concern in the LEFT lower quadrant z. 3. Added density and subtle contour irregularity involving the rectus muscles best seen on sagittal images, associated with site of prior surgical incision just to the RIGHT of midline (image 72, series 2 and image 58, series 5. Not significantly changed compared to prior studies. This measures approximately 2.1 x 1 cm in the sagittal plane and is less well-defined in the axial plane. Potentially postoperative change. Attention on follow-up. 4. Aortic atherosclerosis.   Aortic Atherosclerosis (ICD10-I70.0).       01/29/2020 -  Chemotherapy    Patient is on Treatment Plan: OVARIAN BEVACIZUMAB      05/06/2020 Imaging   1. Status post hysterectomy and oophorectomy. 2. No specific findings of recurrent or metastatic disease in the abdomen or pelvis. 3. No change in postoperative appearance of low midline incision. 4. Unchanged small prominent subcentimeter left iliac lymph nodes.     REVIEW OF SYSTEMS:   Constitutional:  Denies fevers, chills or abnormal weight loss Eyes: Denies blurriness of vision Ears, nose, mouth, throat, and face: Denies mucositis or sore throat Respiratory: Denies cough, dyspnea or wheezes Cardiovascular: Denies palpitation, chest discomfort or lower extremity swelling Gastrointestinal:  Denies nausea, heartburn or change in bowel habits Skin: Denies abnormal skin rashes Lymphatics: Denies new lymphadenopathy or easy bruising Neurological:Denies numbness, tingling or new weaknesses Behavioral/Psych: Mood is stable, no new changes  All other systems were reviewed with the patient and are negative.  I have reviewed the past medical history, past surgical history, social history and family history with the patient and they are unchanged from previous note.  ALLERGIES:  is allergic to [employer] tartrate] and cortisone.  MEDICATIONS:  Current Outpatient Medications  Medication Sig Dispense Refill  . amLODipine (NORVASC) 10 MG tablet Take 1 tablet (10 mg total) by mouth daily. 30 tablet 11  . Armodafinil 250 MG tablet Take 1 tablet (250 mg total) by mouth daily. 90 tablet 0  . busPIRone (BUSPAR) 7.5 MG tablet Take 15 mg by mouth 2 (two) times daily.    . chlorthalidone (HYGROTON) 25 MG tablet Take 25 mg by mouth daily.    . cholecalciferol (VITAMIN D3) 25 MCG (1000 UT) tablet Take 1,000 Units by mouth daily.    . colestipol (COLESTID) 1 g tablet Take 0.5-1  g by mouth daily. Pt taking 1/2 to 1 tablet daily depending on meal choice; taken 1 hour after morning meds , and can not eat for another hour    . eszopiclone (LUNESTA) 2 MG TABS tablet Take immediately before bedtime 90 tablet 0  . FLUoxetine (PROZAC) 10 MG capsule Take 10 mg by mouth every morning. Takes with a 59m capsule for her total dose of 386m    . Marland KitchenLUoxetine (PROZAC) 20 MG capsule Take 20 mg by mouth every morning. Takes with a 1086mapsule for her total dose of 16m78m  . lidocaine-prilocaine (EMLA) cream Apply 1  application topically as needed. 30 g 1  . MELATONIN PO Take 10 mg by mouth at bedtime.     . metFORMIN (GLUCOPHAGE-XR) 500 MG 24 hr tablet Take 500 mg by mouth daily with breakfast.   3  . metoprolol tartrate (LOPRESSOR) 25 MG tablet Take 1 tablet (25 mg total) by mouth 2 (two) times daily. 60 tablet 3  . Multiple Vitamin (MULTIVITAMIN WITH MINERALS) TABS Take 1 tablet by mouth every morning.     . ondansetron (ZOFRAN) 8 MG tablet Take 1 tablet (8 mg total) by mouth every 8 (eight) hours as needed for refractory nausea / vomiting. Start on day 3 after carboplatin chemo. 30 tablet 1  . prochlorperazine (COMPAZINE) 10 MG tablet Take 1 tablet (10 mg total) by mouth every 6 (six) hours as needed (Nausea or vomiting). 30 tablet 1  . rosuvastatin (CRESTOR) 20 MG tablet Take 20 mg by mouth every morning.     . teMarland Kitchenmisartan (MICARDIS) 40 MG tablet Take 40 mg by mouth daily.     No current facility-administered medications for this visit.   Facility-Administered Medications Ordered in Other Visits  Medication Dose Route Frequency Provider Last Rate Last Admin  . bevacizumab-bvzr (ZIRABEV) 1,300 mg in sodium chloride 0.9 % 100 mL chemo infusion  15 mg/kg (Treatment Plan Recorded) Intravenous Once GorsHeath Lark 304 mL/hr at 09/11/20 1031 1,300 mg at 09/11/20 1031  . heparin lock flush 100 unit/mL  500 Units Intracatheter Once PRN GorsAlvy Bimler, MD      . sodium chloride flush (NS) 0.9 % injection 10 mL  10 mL Intravenous PRN GehrNancy Marus   10 mL at 05/12/17 0823  . sodium chloride flush (NS) 0.9 % injection 10 mL  10 mL Intracatheter PRN GorsAlvy Bimler, MD        PHYSICAL EXAMINATION: ECOG PERFORMANCE STATUS: 1 - Symptomatic but completely ambulatory  Vitals:   09/11/20 0823  BP: 125/82  Pulse: 65  Resp: 17  Temp: 98.3 F (36.8 C)  SpO2: 99%   Filed Weights   09/11/20 0823  Weight: 195 lb 12.8 oz (88.8 kg)    GENERAL:alert, no distress and comfortable SKIN: skin color, texture, turgor  are normal, no rashes or significant lesions EYES: normal, Conjunctiva are pink and non-injected, sclera clear OROPHARYNX:no exudate, no erythema and lips, buccal mucosa, and tongue normal  NECK: supple, thyroid normal size, non-tender, without nodularity LYMPH:  no palpable lymphadenopathy in the cervical, axillary or inguinal LUNGS: clear to auscultation and percussion with normal breathing effort HEART: regular rate & rhythm and no murmurs and no lower extremity edema ABDOMEN:abdomen soft, non-tender and normal bowel sounds Musculoskeletal:no cyanosis of digits and no clubbing  NEURO: alert & oriented x 3 with fluent speech, no focal motor/sensory deficits  LABORATORY DATA:  I have reviewed the data as listed    Component Value Date/Time  NA 140 09/11/2020 0800   NA 141 12/02/2016 1219   K 4.2 09/11/2020 0800   K 3.7 12/02/2016 1219   CL 103 09/11/2020 0800   CL 103 01/18/2013 1329   CO2 28 09/11/2020 0800   CO2 27 12/02/2016 1219   GLUCOSE 113 (H) 09/11/2020 0800   GLUCOSE 98 12/02/2016 1219   GLUCOSE 110 (H) 01/18/2013 1329   BUN 21 09/11/2020 0800   BUN 15.3 12/02/2016 1219   CREATININE 0.90 09/11/2020 0800   CREATININE 0.73 07/30/2020 0716   CREATININE 0.7 12/02/2016 1219   CALCIUM 9.2 09/11/2020 0800   CALCIUM 10.1 12/02/2016 1219   PROT 6.9 09/11/2020 0800   PROT 7.1 12/02/2016 1219   ALBUMIN 4.1 09/11/2020 0800   ALBUMIN 4.2 12/02/2016 1219   AST 25 09/11/2020 0800   AST 29 07/30/2020 0716   AST 21 12/02/2016 1219   ALT 30 09/11/2020 0800   ALT 37 07/30/2020 0716   ALT 27 12/02/2016 1219   ALKPHOS 82 09/11/2020 0800   ALKPHOS 104 12/02/2016 1219   BILITOT 0.4 09/11/2020 0800   BILITOT 0.5 07/30/2020 0716   BILITOT 0.37 12/02/2016 1219   GFRNONAA >60 09/11/2020 0800   GFRNONAA >60 07/30/2020 0716   GFRAA >60 05/07/2020 0833    No results found for: SPEP, UPEP  Lab Results  Component Value Date   WBC 5.1 09/11/2020   NEUTROABS 2.6 09/11/2020   HGB  13.7 09/11/2020   HCT 41.2 09/11/2020   MCV 91.2 09/11/2020   PLT 130 (L) 09/11/2020      Chemistry      Component Value Date/Time   NA 140 09/11/2020 0800   NA 141 12/02/2016 1219   K 4.2 09/11/2020 0800   K 3.7 12/02/2016 1219   CL 103 09/11/2020 0800   CL 103 01/18/2013 1329   CO2 28 09/11/2020 0800   CO2 27 12/02/2016 1219   BUN 21 09/11/2020 0800   BUN 15.3 12/02/2016 1219   CREATININE 0.90 09/11/2020 0800   CREATININE 0.73 07/30/2020 0716   CREATININE 0.7 12/02/2016 1219   GLU 236 12/13/2012 1558      Component Value Date/Time   CALCIUM 9.2 09/11/2020 0800   CALCIUM 10.1 12/02/2016 1219   ALKPHOS 82 09/11/2020 0800   ALKPHOS 104 12/02/2016 1219   AST 25 09/11/2020 0800   AST 29 07/30/2020 0716   AST 21 12/02/2016 1219   ALT 30 09/11/2020 0800   ALT 37 07/30/2020 0716   ALT 27 12/02/2016 1219   BILITOT 0.4 09/11/2020 0800   BILITOT 0.5 07/30/2020 0716   BILITOT 0.37 12/02/2016 1219

## 2020-09-16 DIAGNOSIS — M159 Polyosteoarthritis, unspecified: Secondary | ICD-10-CM | POA: Diagnosis not present

## 2020-09-16 DIAGNOSIS — E1159 Type 2 diabetes mellitus with other circulatory complications: Secondary | ICD-10-CM | POA: Diagnosis not present

## 2020-09-16 DIAGNOSIS — E782 Mixed hyperlipidemia: Secondary | ICD-10-CM | POA: Diagnosis not present

## 2020-09-16 DIAGNOSIS — I10 Essential (primary) hypertension: Secondary | ICD-10-CM | POA: Diagnosis not present

## 2020-09-23 NOTE — Patient Instructions (Incomplete)
Please continue using your CPAP regularly. While your insurance requires that you use CPAP at least 4 hours each night on 70% of the nights, I recommend, that you not skip any nights and use it throughout the night if you can. Getting used to CPAP and staying with the treatment long term does take time and patience and discipline. Untreated obstructive sleep apnea when it is moderate to severe can have an adverse impact on cardiovascular health and raise her risk for heart disease, arrhythmias, hypertension, congestive heart failure, stroke and diabetes. Untreated obstructive sleep apnea causes sleep disruption, nonrestorative sleep, and sleep deprivation. This can have an impact on your day to day functioning and cause daytime sleepiness and impairment of cognitive function, memory loss, mood disturbance, and problems focussing. Using CPAP regularly can improve these symptoms.   Continue armodafinil 250mg  daily as needed for sleepiness and fatigue and Lunesta 2mg  at bedtime for insomnia. Please let me know if symptoms do not continue to improve with anxiety management.   Follow up with me in 1 year, sooner if needed.    Quality Sleep Information, Adult Quality sleep is important for your mental and physical health. It also improves your quality of life. Quality sleep means you:  Are asleep for most of the time you are in bed.  Fall asleep within 30 minutes.  Wake up no more than once a night.  Are awake for no longer than 20 minutes if you do wake up during the night. Most adults need 7-8 hours of quality sleep each night. How can poor sleep affect me? If you do not get enough quality sleep, you may have:  Mood swings.  Daytime sleepiness.  Confusion.  Decreased reaction time.  Sleep disorders, such as insomnia and sleep apnea.  Difficulty with: ? Solving problems. ? Coping with stress. ? Paying attention. These issues may affect your performance and productivity at work,  school, and at home. Lack of sleep may also put you at higher risk for accidents, suicide, and risky behaviors. If you do not get quality sleep you may also be at higher risk for several health problems, including:  Infections.  Type 2 diabetes.  Heart disease.  High blood pressure.  Obesity.  Worsening of long-term conditions, like arthritis, kidney disease, depression, Parkinson's disease, and epilepsy. What actions can I take to get more quality sleep?  Stick to a sleep schedule. Go to sleep and wake up at about the same time each day. Do not try to sleep less on weekdays and make up for lost sleep on weekends. This does not work.  Try to get about 30 minutes of exercise on most days. Do not exercise 2-3 hours before going to bed.  Limit naps during the day to 30 minutes or less.  Do not use any products that contain nicotine or tobacco, such as cigarettes or e-cigarettes. If you need help quitting, ask your health care provider.  Do not drink caffeinated beverages for at least 8 hours before going to bed. Coffee, tea, and some sodas contain caffeine.  Do not drink alcohol close to bedtime.  Do not eat large meals close to bedtime.  Do not take naps in the late afternoon.  Try to get at least 30 minutes of sunlight every day. Morning sunlight is best.  Make time to relax before bed. Reading, listening to music, or taking a hot bath promotes quality sleep.  Make your bedroom a place that promotes quality sleep. Keep your  bedroom dark, quiet, and at a comfortable room temperature. Make sure your bed is comfortable. Take out sleep distractions like TV, a computer, smartphone, and bright lights.  If you are lying awake in bed for longer than 20 minutes, get up and do a relaxing activity until you feel sleepy.  Work with your health care provider to treat medical conditions that may affect sleeping, such as: ? Nasal obstruction. ? Snoring. ? Sleep apnea and other sleep  disorders.  Talk to your health care provider if you think any of your prescription medicines may cause you to have difficulty falling or staying asleep.  If you have sleep problems, talk with a sleep consultant. If you think you have a sleep disorder, talk with your health care provider about getting evaluated by a specialist.      Where to find more information  [employer] website: https://sleepfoundation.org  National Heart, Lung, and [place] ([place]): http://www.saunders.info/.pdf  Centers for Disease Control and Prevention (CDC): LearningDermatology.pl Contact a health care provider if you:  Have trouble getting to sleep or staying asleep.  Often wake up very early in the morning and cannot get back to sleep.  Have daytime sleepiness.  Have daytime sleep attacks of suddenly falling asleep and sudden muscle weakness (narcolepsy).  Have a tingling sensation in your legs with a strong urge to move your legs (restless legs syndrome).  Stop breathing briefly during sleep (sleep apnea).  Think you have a sleep disorder or are taking a medicine that is affecting your quality of sleep. Summary  Most adults need 7-8 hours of quality sleep each night.  Getting enough quality sleep is an important part of health and well-being.  Make your bedroom a place that promotes quality sleep and avoid things that may cause you to have poor sleep, such as alcohol, caffeine, smoking, and large meals.  Talk to your health care provider if you have trouble falling asleep or staying asleep. This information is not intended to replace advice given to you by your health care provider. Make sure you discuss any questions you have with your health care provider. Document Revised: 11/03/2017 Document Reviewed: 11/03/2017 Elsevier Patient Education  2021 [employer].   Insomnia Insomnia is a sleep disorder that makes it difficult to fall  asleep or stay asleep. Insomnia can cause fatigue, low energy, difficulty concentrating, mood swings, and poor performance at work or school. There are three different ways to classify insomnia:  Difficulty falling asleep.  Difficulty staying asleep.  Waking up too early in the morning. Any type of insomnia can be long-term (chronic) or short-term (acute). Both are common. Short-term insomnia usually lasts for three months or less. Chronic insomnia occurs at least three times a week for longer than three months. What are the causes? Insomnia may be caused by another condition, situation, or substance, such as:  Anxiety.  Certain medicines.  Gastroesophageal reflux disease (GERD) or other gastrointestinal conditions.  Asthma or other breathing conditions.  Restless legs syndrome, sleep apnea, or other sleep disorders.  Chronic pain.  Menopause.  Stroke.  Abuse of alcohol, tobacco, or illegal drugs.  Mental health conditions, such as depression.  Caffeine.  Neurological disorders, such as Alzheimer's disease.  An overactive thyroid (hyperthyroidism). Sometimes, the cause of insomnia may not be known. What increases the risk? Risk factors for insomnia include:  Gender. Women are affected more often than men.  Age. Insomnia is more common as you get older.  Stress.  Lack of exercise.  Irregular work schedule or working night shifts.  Traveling between different time zones.  Certain medical and mental health conditions. What are the signs or symptoms? If you have insomnia, the main symptom is having trouble falling asleep or having trouble staying asleep. This may lead to other symptoms, such as:  Feeling fatigued or having low energy.  Feeling nervous about going to sleep.  Not feeling rested in the morning.  Having trouble concentrating.  Feeling irritable, anxious, or depressed. How is this diagnosed? This condition may be diagnosed based on:  Your  symptoms and medical history. Your health care provider may ask about: ? Your sleep habits. ? Any medical conditions you have. ? Your mental health.  A physical exam. How is this treated? Treatment for insomnia depends on the cause. Treatment may focus on treating an underlying condition that is causing insomnia. Treatment may also include:  Medicines to help you sleep.  Counseling or therapy.  Lifestyle adjustments to help you sleep better. Follow these instructions at home: Eating and drinking  Limit or avoid alcohol, caffeinated beverages, and cigarettes, especially close to bedtime. These can disrupt your sleep.  Do not eat a large meal or eat spicy foods right before bedtime. This can lead to digestive discomfort that can make it hard for you to sleep.   Sleep habits  Keep a sleep diary to help you and your health care provider figure out what could be causing your insomnia. Write down: ? When you sleep. ? When you wake up during the night. ? How well you sleep. ? How rested you feel the next day. ? Any side effects of medicines you are taking. ? What you eat and drink.  Make your bedroom a dark, comfortable place where it is easy to fall asleep. ? Put up shades or blackout curtains to block light from outside. ? Use a white noise machine to block noise. ? Keep the temperature cool.  Limit screen use before bedtime. This includes: ? Watching TV. ? Using your smartphone, tablet, or computer.  Stick to a routine that includes going to bed and waking up at the same times every day and night. This can help you fall asleep faster. Consider making a quiet activity, such as reading, part of your nighttime routine.  Try to avoid taking naps during the day so that you sleep better at night.  Get out of bed if you are still awake after 15 minutes of trying to sleep. Keep the lights down, but try reading or doing a quiet activity. When you feel sleepy, go back to bed.   General  instructions  Take over-the-counter and prescription medicines only as told by your health care provider.  Exercise regularly, as told by your health care provider. Avoid exercise starting several hours before bedtime.  Use relaxation techniques to manage stress. Ask your health care provider to suggest some techniques that may work well for you. These may include: ? Breathing exercises. ? Routines to release muscle tension. ? Visualizing peaceful scenes.  Make sure that you drive carefully. Avoid driving if you feel very sleepy.  Keep all follow-up visits as told by your health care provider. This is important. Contact a health care provider if:  You are tired throughout the day.  You have trouble in your daily routine due to sleepiness.  You continue to have sleep problems, or your sleep problems get worse. Get help right away if:  You have serious thoughts about hurting yourself or someone  else. If you ever feel like you may hurt yourself or others, or have thoughts about taking your own life, get help right away. You can go to your nearest emergency department or call:  Your local emergency services (911 in the [place]  A suicide crisis helpline, such as the [facility] at 5647358848. This is open 24 hours a day. Summary  Insomnia is a sleep disorder that makes it difficult to fall asleep or stay asleep.  Insomnia can be long-term (chronic) or short-term (acute).  Treatment for insomnia depends on the cause. Treatment may focus on treating an underlying condition that is causing insomnia.  Keep a sleep diary to help you and your health care provider figure out what could be causing your insomnia. This information is not intended to replace advice given to you by your health care provider. Make sure you discuss any questions you have with your health care provider. Document Revised: 06/06/2020 Document Reviewed: 06/06/2020 Elsevier Patient  Education  2021 [employer].

## 2020-09-23 NOTE — Progress Notes (Signed)
PATIENT: Kristin Webb DOB: 1946/08/06  REASON FOR VISIT: follow up HISTORY FROM: patient  Chief Complaint  Patient presents with  . Follow-up    Rm 1 alone Pt states she is not sleeping well and having negative dreams.     HISTORY OF PRESENT ILLNESS:  09/24/20 ALL:  She returns for follow up for OSA on CPAP, EDS and insomnia. She continues CPAP nightly. Lunesta 1m helps with insomnia. She takes armodafinil 2556mas needed for EDS and fatigue. She is not sure if these medications are working. She reports that since the holidays, she has had difficulty waking every 3-5 hours. She is having more "negative" dreams. She has been seen by PCP who was concerned about anxiety related to stage IV cancer diagnosis on Avastin (started about 6-8 months ago). Fluoxetine was increased to 409maily and she was started on buspirone 28m32mD about 2 months ago. She is seeing a counSocial workerP has recommended psychiatry if not responding to fluoxetine and buspirone. She does feel medications are helping.   Compliance report dated 08/24/2020 through 09/22/2020 reveals that she used CPAP 30 of the past 30 days for compliance of 100%.  She used CPAP greater than 4 hours 28 of the past 30 days for compliance of 93%.  Average usage on days used was 7 hours and 15 minutes.  Residual AHI was 1.7 on a set pressure of 16 cm of water.  There was a leak noted in the 95th percentile of 27.8 L/min.   09/25/2019 ALL:  JoanCarren Blakleya 75 y46. female here today for follow up for OSA on CPAP.  Overall, she is doing fairly well with her CPAP machine.  She has recently restarted chemotherapy for stage IV ovarian cancer.  She is anticipating receiving her third of 6 total treatments this week.  She will then have a PET scan to determine how long therapy will need to be continued.  There is also a question of possible need for colon resection as tumor is attached to part of her colon.  She has noted that her skin is more  sensitive.  She has bags under her eyes that she feels are pinched with the current full facemask she is using. Currently using ResMed Airfit small FFM.  She does have more difficulty falling asleep.  She is now using melatonin 10 mg and Benadryl 25 mg about an hour before bedtime.  She continues Lunesta to help her stay asleep.  Armodafinil does help with daytime sleepiness.  She does not use this medication on chemotherapy days as she feels there is no benefit.  She does endorse increasing fatigue and feels this is related to her chemotherapy.  Compliance report dated 08/21/2018 08/20/2019 reveals that she used CPAP 30 out of the last 30 days for compliance of 100%.  She used CPAP greater than 4 hours 28 of the last 30 days for compliance of 93%.  Average usage was 8 hours and 44 minutes.  Residual AHI was 1.2 on 16 cm of water and EPR of 1.  There was no significant leak noted.  HISTORY: (copied from Dr Dohmeier's note on 05/23/2019)  HPI:  JoanKriston Mckinniea 75 y34. female patient with a history of OSA on CPAP -and persistent fatigue, is seen here in a revisit for CPAP follow up/ compliance visit.  She is a pracGlass blower/designers a large social circle. She is living alone with her rescue dog, and is optimistic and happy.  Rv 05-23-2019-I have the pleasure of looking at the data download for [name] encompassing 30 days before 21 May 2019.  She has been 100% compliant with an average usage time of 8 hours 90 minutes, minimum pressure is 5 maximum pressure is applied is 15 cmH2O with 1 cm expiratory pressure relief.  Residual AHI is 1.4 which is an excellent resolution she has minimal air leaks her 95th percentile pressure is 14.9cm  this may be related to some weight gain. She has used melatonin, benadryl.   Kristin Webb is seen here on 09 May 2018, she has been a highly compliant CPAP user, her downloaded data however have to be combined from 2 different machines.  For general  use at home she has an air sense 10 AutoSet machine and then she has a travel CPAP as well.  The average user time on days used is 8 hours and 59 minutes, she has used CPAP in one form or another for 97% of the recorded time, she is using AutoSet 5-15 cmH2O was 1 cm EPR level the 95th percentile pressure required is 14.5 which straddles the upper limit.  Residual AHI is 0.8 which is excellent and there are no major air leaks.  She has gained weight since I saw her last and her BMI rose by over 4 points this may explain why she needs a higher setting at this time.  Her Epworth Sleepiness Scale was endorsed at 13 out of 24 possible points and her fatigue severity at 44 out of 63 possible points, the geriatric depression scale was endorsed at 1 out of 15 points.  Interval history from 05/05/2017, Kristin Webb is doing well, she is living by herself with her dog, recently had some home restoration to do. This added a little bit of hectic to her life, however she is doing well and her counselor has not progressed. She is followed by a new oncologist, [name]. She underwent another HST following significant weight gain, AHI was 9.1 /hr. She has many desaturations and was placed on auto CPAP in response to high EDS with mild apnea.  Her compliance to CPAP with excellent at 90% with an average use of 8 hours and 32 minutes, she is using an AutoSet between 5 and 15 cm water was 1 cm EPR and her residual AHI is 1.9. This is excellent resolution of her apnea. The 95th percentile pressure is 14.8, there were no central apneas emerging, there is no need to adjust the machine in any way. She endorsed the Epworth Sleepiness Scale at 9 points, fatigue severity at 44 points and the geriatric depression score is 1 out of 15 points. She continues to participate in a grief group after her husband's death. She started Yoga, Zen knitting and fosters dogs.   12-12-13,Since I have seen her last this patient was diagnosed with  ovarian cancer, underwent surgery and chemotherapy. She currently struggles with a weak  abdominal and core musculatur  and it feels to her she had an abdominal hernia. She has tried a corsett , but this has aggravated her bulging discs in her spine.  The patient was originally referred by Dr. Sheryn Bison at the time with fatigue , pain, excessive sleepiness and burning mouth syndrome. In 2010 she developed transiently left mouth droop, and a TIA/ CVA work up was negative. EMG and nerve conduction studies were unrevealing , a rheumatology consult,  pain specialist , neurosurgical evaluation were unrevealing .  Labs revealed a vitamin  D deficiency,  normal TSH and CMET, CBC.  Her neurosurgery consult for back pain was without results.  She was referred to me for a sleep consultation in 2009. The patient was diagnosed with sleep apnea on 03-07-08 with an AHI of 24.3 and in REM AHI of 82. Her BMI at the time was 37.9 , she was titrated to 10 cm water pressure on CPAP  which relieved her AHI but she still snored.  Kristin Webb's newest  sleep study dated 11-26-13 whowed  residual apnea. The study was therefore a normal polysomnography and not split into a titration parts. The AHI was 0.9, the RDI was 2.4 and the oxygen nadir was 87% time and his saturation was 33.6 minutes at or below 90% saturation of oxygen. Heart which was regular, normal sinus rhythm prevailed. She still sleeps better with Lunesta 2 mg could not get a good result from 1 mg pills. Fragmentation of sleep, alpha intrusion can be seen in chronic pain and fatigue.She is not a coffee drinker, there is no caffeine to be cut out. There is no pain in sleep. Sleep psychology referral is problematic with [facility]. Sleep hygiene was discussed.   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 She has compliantly  used a  Original machine,  New the CPAP  machine seems to have failed. I am unable to obtain any data. In  addition the patient had remained excessively fatigued and daytime sleepy. Possibly , this could have been a paraneoplastic manifestation of ovarian malignancy. She has been using Nuvigil as tolerated which has given her improvement in life quality and energy.   The last CPAP  download was on 05-06-12 with the 95% percentile pressure of 12 cm water, the residual AHI of 0.5 and an average of 5.9 hours of night-user time for CPAP.  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 . Kristin Webb since being diagnosed with ovarian cancer has underwent a lot of emotional changes as well. Her oncologist, Dr. Marko Plume, feels that some of the nausea is GERD-   So far since last week has not been a palpable results. The patient has also tried to wean off her sleep aids but became panicked and insomniac again.Mr. Webb had been admitted to the hospital after a spot on his lung was incidentally found , while being evaluated for kidney stones. He went into acute respiratory distress was placed on oxygen oxygen and readmitted to the hospital but never recovered. And during that time she needed to return to her previous insomnia regimen. She is under financial distress, had a lot of difficulties since her husbands death.  CD Interval history from 05/18/2016, Kristin Webb has been able to cut down on her Lunesta dose from 2 mg to 1 mg and uses if necessary Nuvigil to drive. She has slept usually uninterrupted as of last summer also thinks have changed again in 2017 now she begun in August to wake up 3-4 times a week in AM with a headache, very unusual for her. Nausea is associated with these headaches, no photophobia or phonophobia is reported. And as of August she has needed and used Nuvigil 125 g daily again. Not waking up in the night, told that she snores by a roommate during a church trip. Her  fatigue is reminiscent of the times when she had suffered from sleep apnea.   CD Interval history from  07/15/2016, Kristin Webb is here to see the results of her recent home sleep test, performed on 06/10/2016. Her AHI was 2.2 her RDI was 5 per hour there was no significant sleep apnea noted no oxygen desaturation was found, and she did not have tachycardia or bradycardia arrhythmia. Her fatigue has remained her Epworth sleepiness score has actually increased to 12 point from 10 during our last visit. She states that she often only gets up in the morning before because she has to let the dog out but then feels that she needs another hour of sleep afterwards. She also suffers from dry mouth and dry eyes. There is a possible autoimmune component, and she does suffer from fibromyalgia.  She does not endorse a significant depression score since she lost her husband she has been grieving but she has not "fallen into a deep hole". She sleeps with the help of Lunesta 1 mg and Benadryl, she has failed Ambien (which caused her to sleep walk). She was also significantly more drowsy in daytime. She could neither tolerate Ambien 10 not 5 mg.   REVIEW OF SYSTEMS: Out of a complete 14 system review of symptoms, the patient complains only of the following symptoms, insomnia, apnea, frequent waking, daytime sleepiness, snoring, dizziness, headaches, weakness since starting chemo and all other reviewed systems are negative.  Epworth Sleepiness Scale: 17 Fatigue severity scale: 53  ALLERGIES: Allergies  Allergen Reactions  . Ambien [Zolpidem Tartrate] Other (See Comments)    Sleep walking  . Cortisone Nausea And Vomiting, Swelling and Other (See Comments)    INJECTED CORTISONE ONLY, "sick to my stomach, throwing up, hand swelling, and pain."    HOME MEDICATIONS: Outpatient Medications Prior to Visit  Medication Sig Dispense Refill  . amLODipine (NORVASC) 10 MG tablet Take 1 tablet (10 mg total) by mouth  daily. 30 tablet 11  . Armodafinil 250 MG tablet Take 1 tablet (250 mg total) by mouth daily. 90 tablet 0  . bevacizumab in sodium chloride 0.9 % 100 mL Inject into the vein once.    . busPIRone (BUSPAR) 7.5 MG tablet Take 15 mg by mouth 2 (two) times daily.    . chlorthalidone (HYGROTON) 25 MG tablet Take 25 mg by mouth daily.    . cholecalciferol (VITAMIN D3) 25 MCG (1000 UT) tablet Take 1,000 Units by mouth daily.    . colestipol (COLESTID) 1 g tablet Take 0.5-1 g by mouth daily. Pt taking 1/2 to 1 tablet daily depending on meal choice; taken 1 hour after morning meds , and can not eat for another hour    . eszopiclone (LUNESTA) 2 MG TABS tablet Take immediately before bedtime 90 tablet 0  . FLUoxetine (PROZAC) 20 MG capsule Take 40 mg by mouth every morning. Takes with a 45m capsule for her total dose of 339m    . lidocaine-prilocaine (EMLA) cream Apply 1 application topically as needed. 30 g 1  . MELATONIN PO Take 10 mg by mouth at bedtime.     . metFORMIN (GLUCOPHAGE-XR) 500 MG 24 hr tablet Take 500 mg by mouth daily with breakfast.   3  . metoprolol tartrate (LOPRESSOR) 25 MG tablet Take 1 tablet (25 mg total) by mouth 2 (two) times daily. 60 tablet 3  . Multiple Vitamin (MULTIVITAMIN WITH MINERALS) TABS Take 1 tablet by mouth every morning.     . rosuvastatin (CRESTOR) 20 MG tablet Take 20 mg by mouth every morning.     . Marland Kitchenelmisartan (MICARDIS) 40 MG tablet Take  40 mg by mouth daily.    Marland Kitchen FLUoxetine (PROZAC) 10 MG capsule Take 10 mg by mouth every morning. Takes with a 29m capsule for her total dose of 377m    . ondansetron (ZOFRAN) 8 MG tablet Take 1 tablet (8 mg total) by mouth every 8 (eight) hours as needed for refractory nausea / vomiting. Start on day 3 after carboplatin chemo. 30 tablet 1  . prochlorperazine (COMPAZINE) 10 MG tablet Take 1 tablet (10 mg total) by mouth every 6 (six) hours as needed (Nausea or vomiting). 30 tablet 1   Facility-Administered Medications Prior to  Visit  Medication Dose Route Frequency Provider Last Rate Last Admin  . sodium chloride flush (NS) 0.9 % injection 10 mL  10 mL Intravenous PRN GeNancy MarusMD   10 mL at 05/12/17 [id number]  PAST MEDICAL HISTORY: Past Medical History:  Diagnosis Date  . Burning mouth syndrome   . Chronic fatigue   . Constipation   . Diabetes ([facility]/[place]  . Diarrhea    in the past after gallbladder removal  . Fibromyalgia   . GERD (gastroesophageal reflux disease)   . History of kidney stones 07/2015  . Hyperlipidemia   . Hypertension    borderline not on meds   . Insomnia secondary to depression with anxiety   . Lumbar disc disease   . Ovarian cancer ([facility]/2020   met nodule in abd  . Pelvic mass in female   . Pneumonia    hx of pneumonia as an infant  . PONV (postoperative nausea and vomiting)    pain from gas hernia 2017  . Shortness of breath    with exertion   . Sleep apnea    uses CPAP  . Yeast infection     PAST SURGICAL HISTORY: Past Surgical History:  Procedure Laterality Date  . ABDOMINAL HYSTERECTOMY     early 1921s. APPENDECTOMY     WITH DEBULKING/BSO  . CHOLECYSTECTOMY     early 1926s. CYSTOSCOPY W/ URETERAL STENT PLACEMENT Left 07/09/2015   DUE TO NEPHROLITHIASIS Procedure: CYSTOSCOPY WITH LEFT RETROGRADE PYELOGRAM/ LEFT URETERAL STENT PLACEMENT;  Surgeon: BeArdis HughsMD;  Location: [facility];  Service: Urology;  Laterality: Left;  . HERNIA REPAIR    . INCISIONAL HERNIA REPAIR N/A 12/25/2015   WITH MESH Procedure:  INCISIONAL HERNIA REPAIR;  Surgeon: ArRalene OkMD;  Location: [facility];  Service: General;  Laterality: N/A;  . INCISIONAL HERNIA REPAIR N/A 10/21/2018   Procedure: LAPAROSCOPIC INCISIONAL HERNIA REPAIR WITH MESH;  Surgeon: RaRalene OkMD;  [facility];  Laterality: N/A;  . INSERTION OF MESH N/A 12/25/2015   Procedure: INSERTION OF MESH;  Surgeon: ArRalene OkMD;  Location: [facility];  Service: General;   Laterality: N/A;  . LAPAROTOMY Bilateral 11/08/2012   Procedure: EXPLORATORY LAPAROTOMY BILATERAL SALPINGO OOPHORECTOMY TUMOR DEBULKING ;  Surgeon: PaImagene Gurney. GeAlycia RossettiMD;  Location: [facility];  Service: Gynecology;  Laterality: Bilateral;  APPENDECTOMY / OMENTECTOMY  . LAPAROTOMY N/A 12/25/2015   Procedure: EXPLORATORY LAPAROTOMY;  Surgeon: ArRalene OkMD;  Location: [facility];  Service: General;  Laterality: N/A;  . LYSIS OF ADHESION N/A 12/25/2015   Procedure: LYSIS OF ADHESION;  Surgeon: ArRalene OkMD;  Location: [facility];  Service: General;  Laterality: N/A;  . PARTIAL HYSTERECTOMY  2006  . TRIGGER FINGER RELEASE      FAMILY HISTORY: Family History  Problem Relation Age of Onset  . Lung  cancer Mother 68  . High blood pressure Mother   . High Cholesterol Mother   . Lung cancer Father 29       2 ppd smoker  . Parkinson's disease Father   . Kidney disease Other     SOCIAL HISTORY: Social History   Socioeconomic History  . Marital status: Widowed    Spouse name: Kristin Webb  . Number of children: 2  . Years of education: Master's  . Highest education level: Not on file  Occupational History  . Not on file  Tobacco Use  . Smoking status: Never Smoker  . Smokeless tobacco: Never Used  Vaping Use  . Vaping Use: Never used  Substance and Sexual Activity  . Alcohol use: No  . Drug use: No  . Sexual activity: Not on file  Other Topics Concern  . Not on file  Social History Narrative   Patient is married Kristin Webb). Husband passed away in Oct 07, 2014   Patient has 2 children by birth and 13 children all together.   Patient has a Oceanographer.   Patient is right-handed.   Patient drinks very little caffeine.         Social Determinants of Health   Financial Resource Strain: Not on file  Food Insecurity: Not on file  Transportation Needs: Not on file  Physical Activity: Not on file  Stress: Not on file  Social Connections: Not on file  Intimate Partner Violence: Not on file       PHYSICAL EXAM  Vitals:   09/24/20 0718  BP: 137/89  Pulse: 67  Weight: 194 lb (88 kg)  Height: _0  (1.499 m)   Body mass index is 39.18 kg/m.  Generalized: Well developed, in no acute distress  Cardiology: normal rate and rhythm, no murmur noted Respiratory: Clear to auscultation bilaterally  Neurological examination  Mentation: Alert oriented to time, place, history taking. Follows all commands speech and language fluent Cranial nerve II-XII: Pupils were equal round reactive to light. Extraocular movements were full, visual field were full  Motor: The motor testing reveals 5 over 5 strength of all 4 extremities. Good symmetric motor tone is noted throughout.  Gait and station: Gait is normal.    DIAGNOSTIC DATA (LABS, IMAGING, TESTING) - I reviewed patient records, labs, notes, testing and imaging myself where available.  No flowsheet data found.   Lab Results  Component Value Date   WBC 5.1 09/11/2020   HGB 13.7 09/11/2020   HCT 41.2 09/11/2020   MCV 91.2 09/11/2020   PLT 130 (L) 09/11/2020      Component Value Date/Time   NA 140 09/11/2020 0800   NA 141 12/02/2016 1219   K 4.2 09/11/2020 0800   K 3.7 12/02/2016 1219   CL 103 09/11/2020 0800   CL 103 01/18/2013 1329   CO2 28 09/11/2020 0800   CO2 27 12/02/2016 1219   GLUCOSE 113 (H) 09/11/2020 0800   GLUCOSE 98 12/02/2016 1219   GLUCOSE 110 (H) 01/18/2013 1329   BUN 21 09/11/2020 0800   BUN 15.3 12/02/2016 1219   CREATININE 0.90 09/11/2020 0800   CREATININE 0.73 07/30/2020 0716   CREATININE 0.7 12/02/2016 1219   CALCIUM 9.2 09/11/2020 0800   CALCIUM 10.1 12/02/2016 1219   PROT 6.9 09/11/2020 0800   PROT 7.1 12/02/2016 1219   ALBUMIN 4.1 09/11/2020 0800   ALBUMIN 4.2 12/02/2016 1219   AST 25 09/11/2020 0800   AST 29 07/30/2020 0716   AST 21 12/02/2016 1219   ALT  30 09/11/2020 0800   ALT 37 07/30/2020 0716   ALT 27 12/02/2016 1219   ALKPHOS 82 09/11/2020 0800   ALKPHOS 104 12/02/2016 1219    BILITOT 0.4 09/11/2020 0800   BILITOT 0.5 07/30/2020 0716   BILITOT 0.37 12/02/2016 1219   GFRNONAA >60 09/11/2020 0800   GFRNONAA >60 07/30/2020 0716   GFRAA >60 05/07/2020 0833   No results found for: CHOL, HDL, LDLCALC, LDLDIRECT, TRIG, CHOLHDL Lab Results  Component Value Date   HGBA1C 6.0 (H) 10/17/2018   No results found for: VITAMINB12 No results found for: TSH     ASSESSMENT AND PLAN 75 y.o. year old female  has a past medical history of Burning mouth syndrome, Chronic fatigue, Constipation, Diabetes ([place]) (05/09/2018), Diarrhea, Fibromyalgia, GERD (gastroesophageal reflux disease), History of kidney stones (07/2015), Hyperlipidemia, Hypertension, Insomnia secondary to depression with anxiety, Lumbar disc disease, Ovarian cancer ([place]) (2014/2020), Pelvic mass in female, Pneumonia, PONV (postoperative nausea and vomiting), Shortness of breath, Sleep apnea, and Yeast infection. here with     ICD-10-CM   1. OSA on CPAP  G47.33 For home use only DME continuous positive airway pressure (CPAP)   Z99.89   2. Excessive daytime sleepiness  G47.19   3. Adjustment insomnia  F51.02       Nevah continues to do well with CPAP therapy.  She is having more fatigue, negative dreams and frequent waking. She feels symptoms clearly worsened after starting Avastin. She reports symptoms seem to be improving some following increased dose of fluoxetine and addition of buspirone. She continues excellent compliance with CPAP therapy. She has worked with DME to find a mask that fits better. She does have a leak but AHI remains at goal. She was encouraged to continue using CPAP nightly and for greater than 4 hours each night.  She may continue melatonin and Benadryl as needed for insomnia.  We will continue Lunesta as this is helping her stay asleep.  She will continue armodafinil for daytime sleepiness. She will continue to follow up closely with oncology and PCP. She will call us with any new concerns.   She will follow-up with [place] in 1 year, sooner if needed.  She verbalizes understanding and agreement with this plan.   Orders Placed This Encounter  Procedures  . For home use only DME continuous positive airway pressure (CPAP)    Supplies    Order Specific Question:   Length of Need    Answer:   [employer]    Order Specific Question:   Patient has OSA or probable OSA    Answer:   Yes    Order Specific Question:   Is the patient currently using CPAP in the home    Answer:   Yes    Order Specific Question:   Settings    Answer:   Other see comments    Order Specific Question:   CPAP supplies needed    Answer:   Mask, headgear, cushions, filters, heated tubing and water chamber     No orders of the defined types were placed in this encounter.     I spent 25 minutes with the patient. 50% of this time was spent counseling and educating patient on plan of care and medications.    Debbora Presto, FNP-C 09/24/2020, 8:09 AM [facility] [address] [place], [place] [address] [phone]

## 2020-09-24 ENCOUNTER — Encounter: Payer: Self-pay | Admitting: Family Medicine

## 2020-09-24 ENCOUNTER — Ambulatory Visit: Payer: Medicare HMO | Admitting: Family Medicine

## 2020-09-24 VITALS — BP 137/89 | HR 67 | Ht 59.0 in | Wt 194.0 lb

## 2020-09-24 DIAGNOSIS — G4733 Obstructive sleep apnea (adult) (pediatric): Secondary | ICD-10-CM | POA: Diagnosis not present

## 2020-09-24 DIAGNOSIS — G4719 Other hypersomnia: Secondary | ICD-10-CM | POA: Diagnosis not present

## 2020-09-24 DIAGNOSIS — F5102 Adjustment insomnia: Secondary | ICD-10-CM

## 2020-09-24 DIAGNOSIS — Z9989 Dependence on other enabling machines and devices: Secondary | ICD-10-CM

## 2020-09-30 NOTE — Progress Notes (Signed)
CM sent to [facility]  

## 2020-10-02 ENCOUNTER — Other Ambulatory Visit: Payer: Self-pay

## 2020-10-02 ENCOUNTER — Inpatient Hospital Stay: Payer: Medicare HMO | Admitting: Hematology and Oncology

## 2020-10-02 ENCOUNTER — Inpatient Hospital Stay: Payer: Medicare HMO

## 2020-10-02 ENCOUNTER — Encounter: Payer: Self-pay | Admitting: Hematology and Oncology

## 2020-10-02 VITALS — BP 130/90 | HR 66 | Temp 97.8°F | Resp 16 | Wt 194.0 lb

## 2020-10-02 VITALS — BP 114/66

## 2020-10-02 DIAGNOSIS — D696 Thrombocytopenia, unspecified: Secondary | ICD-10-CM

## 2020-10-02 DIAGNOSIS — C5701 Malignant neoplasm of right fallopian tube: Secondary | ICD-10-CM

## 2020-10-02 DIAGNOSIS — G4700 Insomnia, unspecified: Secondary | ICD-10-CM

## 2020-10-02 DIAGNOSIS — Z5112 Encounter for antineoplastic immunotherapy: Secondary | ICD-10-CM | POA: Diagnosis not present

## 2020-10-02 DIAGNOSIS — Z7189 Other specified counseling: Secondary | ICD-10-CM

## 2020-10-02 DIAGNOSIS — G8929 Other chronic pain: Secondary | ICD-10-CM | POA: Diagnosis not present

## 2020-10-02 DIAGNOSIS — R971 Elevated cancer antigen 125 [CA 125]: Secondary | ICD-10-CM | POA: Diagnosis not present

## 2020-10-02 DIAGNOSIS — M549 Dorsalgia, unspecified: Secondary | ICD-10-CM | POA: Diagnosis not present

## 2020-10-02 DIAGNOSIS — C5700 Malignant neoplasm of unspecified fallopian tube: Secondary | ICD-10-CM | POA: Diagnosis not present

## 2020-10-02 DIAGNOSIS — C786 Secondary malignant neoplasm of retroperitoneum and peritoneum: Secondary | ICD-10-CM | POA: Diagnosis not present

## 2020-10-02 DIAGNOSIS — Z90722 Acquired absence of ovaries, bilateral: Secondary | ICD-10-CM | POA: Diagnosis not present

## 2020-10-02 LAB — COMPREHENSIVE METABOLIC PANEL
ALT: 29 U/L (ref 0–44)
AST: 25 U/L (ref 15–41)
Albumin: 4.1 g/dL (ref 3.5–5.0)
Alkaline Phosphatase: 80 U/L (ref 38–126)
Anion gap: 8 (ref 5–15)
BUN: 15 mg/dL (ref 8–23)
CO2: 27 mmol/L (ref 22–32)
Calcium: 9.2 mg/dL (ref 8.9–10.3)
Chloride: 104 mmol/L (ref 98–111)
Creatinine, Ser: 0.73 mg/dL (ref 0.44–1.00)
GFR, Estimated: >60 mL/min (ref >60)
Glucose, Bld: 106 mg/dL — ABNORMAL HIGH (ref 70–99)
Potassium: 4 mmol/L (ref 3.5–5.1)
Sodium: 139 mmol/L (ref 135–145)
Total Bilirubin: 0.5 mg/dL (ref 0.3–1.2)
Total Protein: 7 g/dL (ref 6.5–8.1)

## 2020-10-02 LAB — CBC WITH DIFFERENTIAL/PLATELET
Abs Immature Granulocytes: 0.01 10*3/uL (ref 0.00–0.07)
Basophils Absolute: 0 10*3/uL (ref 0.0–0.1)
Basophils Relative: 1 %
Eosinophils Absolute: 0.1 10*3/uL (ref 0.0–0.5)
Eosinophils Relative: 2 %
HCT: 41.9 % (ref 36.0–46.0)
Hemoglobin: 13.9 g/dL (ref 12.0–15.0)
Immature Granulocytes: 0 %
Lymphocytes Relative: 33 %
Lymphs Abs: 1.8 10*3/uL (ref 0.7–4.0)
MCH: 30.5 pg (ref 26.0–34.0)
MCHC: 33.2 g/dL (ref 30.0–36.0)
MCV: 92.1 fL (ref 80.0–100.0)
Monocytes Absolute: 0.5 10*3/uL (ref 0.1–1.0)
Monocytes Relative: 9 %
Neutro Abs: 3 10*3/uL (ref 1.7–7.7)
Neutrophils Relative %: 55 %
Platelets: 129 10*3/uL — ABNORMAL LOW (ref 150–400)
RBC: 4.55 MIL/uL (ref 3.87–5.11)
RDW: 13.2 % (ref 11.5–15.5)
WBC: 5.4 10*3/uL (ref 4.0–10.5)
nRBC: 0 % (ref 0.0–0.2)

## 2020-10-02 LAB — TOTAL PROTEIN, URINE DIPSTICK: Protein, ur: NEGATIVE mg/dL

## 2020-10-02 MED ORDER — SODIUM CHLORIDE 0.9 % IV SOLN
Freq: Once | INTRAVENOUS | Status: AC
  Filled 2020-10-02: qty 250

## 2020-10-02 MED ORDER — HEPARIN SOD (PORK) LOCK FLUSH 100 UNIT/ML IV SOLN
500.0000 [IU] | Freq: Once | INTRAVENOUS | Status: AC | PRN
  Administered 2020-10-02: 500 [IU]
  Filled 2020-10-02: qty 5

## 2020-10-02 MED ORDER — SODIUM CHLORIDE 0.9% FLUSH
10.0000 mL | Freq: Once | INTRAVENOUS | Status: AC
  Administered 2020-10-02: 10 mL
  Filled 2020-10-02: qty 10

## 2020-10-02 MED ORDER — SODIUM CHLORIDE 0.9 % IV SOLN
15.0000 mg/kg | Freq: Once | INTRAVENOUS | Status: AC
  Administered 2020-10-02: 1300 mg via INTRAVENOUS
  Filled 2020-10-02: qty 48

## 2020-10-02 MED ORDER — SODIUM CHLORIDE 0.9% FLUSH
10.0000 mL | INTRAVENOUS | Status: DC | PRN
  Administered 2020-10-02: 10 mL
  Filled 2020-10-02: qty 10

## 2020-10-02 NOTE — Assessment & Plan Note (Signed)
She follows with neurologist I reviewed documentation She is prescribed medicine to help with the sleep

## 2020-10-02 NOTE — Progress Notes (Signed)
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 CT ABDOMEN PELVIS W CONTRAST    Standing Status:   Future    Standing Expiration Date:   10/02/2021    Order Specific Question:   If indicated for the ordered procedure, I authorize the administration of contrast media per Radiology protocol    Answer:   Yes    Order Specific Question:   Preferred imaging location?    Answer:   [facility]    Order Specific Question:   Radiology Contrast Protocol - do NOT remove file path    Answer:   [email or web address]    All questions were answered. The patient knows to call the clinic with any problems, questions or concerns. The total time spent in the appointment was 20 minutes encounter with patients including review of chart and various tests results, discussions about plan of care and coordination of care plan   [name], MD 10/02/2020 12:27 PM  INTERVAL HISTORY: Please see below for problem oriented charting. She returns with her friend for further follow-up She is doing well She is careful about what she eats and she is exercising Denies recent bowel issues Her blood pressure is under good control  SUMMARY OF ONCOLOGIC HISTORY: Oncology History Overview Note   Oncologic Summary: 1. 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 Random CA 125 elevation January 2019  Question mesenteric nodules and anterior abdominal wall nodule 3. GeneDx Breast/Ovary Panel negative (including BRCA, MMR's, RAD51 etc) ? [facility] BRACAnalysis  Negative for BRCA1/2 in tumor   Fallopian tube carcinoma ([place])  11/01/2012 Imaging   Ct abdomen 1.  Interval development of large mid abdominal mass highly concerning for right ovarian cancer.  There is mild omental nodularity on the left, and peritoneal disease cannot be completely excluded.  There is no ascites or other evidence of metastatic disease. 2.  Mild associated renal pelvocaliectasis bilaterally without obstruction.  Nonobstructing left renal calculus and a small right renal angiomyolipoma noted incidentally.   11/08/2012 Pathology Results   1. 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 - BENIGN FALLOPIAN TUBE WITH NONSPECIFIC CHRONIC INFLAMMATION. 2. Ovary and fallopian tube, left - BENIGN OVARY; NEGATIVE FOR ATYPIA OR MALIGNANCY. - BENIGN FALLOPIAN TUBE; NEGATIVE FOR ATYPIA OR MALIGNANCY. 3. Omentum, resection for tumor - HIGH GRADE CARCINOMA, SEE COMMENT. 4. Appendix, Other than Incidental - FIBROUS OBLITERATION OF APPENDICEAL TIP. - NEGATIVE FOR MALIGNANCY.   11/08/2012 Surgery   Surgery: Exploratory laparotomy, bilateral salpingo-oophorectomy, appendectomy, infacolic omentectomy, optimal debulking  Surgeons:  Paola A. Alycia Rossetti, MD; Lahoma Crocker, MD   Assistant: Caswell Corwin  Pathology: Bilateral fallopian tubes  and ovaries to pathology. Appendix as well as omentum. 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  Operative findings: 25 cm right adnexal mass with smooth surface. Surgically absent uterus. Atrophic-appearing left ovary. Normal appearing appendix. Within the omentum there were centimeter nodules scattered throughout the omentum. The remainder of the surfaces were benign.   12/08/2012 Procedure   Impression:  Placement of a subcutaneous port device.  The catheter tip is in the lower SVC and ready to be used.    12/13/2012 - 03/28/2013 Chemotherapy   s/p 6 cycles of paclitaxel and carboplatin   12/13/2012 - 03/28/2013 Chemotherapy   The patient had 6 cycles of carboplatin and Taxol   03/24/2013 Imaging   US abdomen   04/24/2013 Imaging   CT abdomen Interval resection of the large right pelvic and lower abdominal mass lesion with apparent omentectomy.  No evidence for intraperitoneal free fluid on today's study.  No discernible peritoneal lesions.  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   09/07/2013 Genetic Testing   Patient has genetic testing done for BRCA1/2 panel Results revealed patient has no mutation(s):   07/09/2015 Imaging   CT abdomen 1. 12 mm obstructive calculus at the left ureteropelvic junction with moderate proximal hydronephrosis. 2. 2 small supraumbilical ventral hernias, one containing a short segment of the mid transverse colon and the other containing a short segment of the mid small bowel. There is no associated evidence to suggest bowel incarceration or obstruction at this time. 3. Tiny locule of gas non dependently in the lumen of the urinary bladder. This is presumably iatrogenic related to recent catheterization for urinalysis. Alternatively, this could be seen in the setting of urinary tract infection with gas-forming organisms. Clinical correlation for history of recent  catheterization is recommended. 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 5. Status post cholecystectomy. 6. Additional incidental findings, as above.    12/11/2016 Imaging   Ct abdomen 1. No evidence of metastatic ovarian cancer. 2. Recurrent subxiphoid ventral abdominal wall hernia containing transverse colon. No evidence of incarceration or obstruction. 3. Stable incidental findings in the liver and kidneys. No recurrent urinary tract calculus. 4. Progressive lower lumbar spondylosis. 5.  Aortic Atherosclerosis (ICD10-I70.0).    08/30/2017 Imaging   MRI thoracic spine 1. At T5-6 there is a small central disc protrusion contacting the ventral thoracic spinal cord. No central canal or foraminal stenosis. 2. At T9-10 there is a small right paracentral disc protrusion. 3.  No acute osseous injury of the thoracic spine. 4. No aggressive osseous lesion to suggest metastatic disease.   09/24/2017 Tumor Marker   Patient's tumor was tested for the following markers: CA-125 Results of the tumor marker test revealed 21.7   09/30/2017 Imaging   CT abdomen 1. New small clustered soft tissue nodules in the left lower quadrant in the sigmoid mesentery, largest 1.0 cm, which could represent recurrent peritoneal tumor implants. No ascites.  2. Midline high ventral abdominal wall hernia containing a portion of the transverse colon is mildly increased in size, and without bowel complication at this time. 3. Chronic findings include: Aortic Atherosclerosis (ICD10-I70.0). Diffuse hepatic steatosis. 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   10/11/2017 PET scan   1. Nodules in the sigmoid mesentery are hypermetabolic and highly worrisome  for metastatic disease. 2. Attic steatosis.    11/03/2017 Procedure   Successful CT-guided rectus abdominal muscle mass core biopsy. Path: - FOREIGN BODY GIANT CELL REACTION INVOLVING  FIBROADIPOSE TISSUE AND SKELETAL MUSCLE. - NO EVIDENCE OF MALIGNANCY.   11/04/2017 Cancer Staging   Staging form: Fallopian Tube, AJCC 7th Edition - Clinical: FIGO Stage IIIC, calculated as Stage IV (rT3, N0, M1) - Signed by [name], MD on 08/09/2019   02/2018 Imaging   CT: 1. Continued increase in size small peritoneal nodules along the mesenteric border of the proximal sigmoid colon as well as along the serosal surface of the proximal sigmoid colon. 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 2. No evidence of distant disease. 3. Stable large ventral hernia.   05/2018 Imaging   PET: 1. Redemonstration of hypermetabolic nodules within the sigmoid mesocolon. Mild response to therapy relative to CT of 02/24/2018. Mixed response to therapy compared to the most recent PET of 10/11/2017. 2. Hypermetabolism within the right pelvic rectus musculature, increased since the prior PET.  Clinical service requested comparison to the 11/24/2017 CT. Index 10 mm nodule within the sigmoid mesocolon was similar to the 02/24/2018 CT, and as described on that exam, increased from 7 mm on 11/24/2017. More inferior nodule within the mesocolon measures 12 mm today on image 156/4 and 8 mm on 11/24/2017.   05/2018 Imaging   CT: IMPRESSION: 1. Since 02/24/2018, decreased size of peritoneal nodules centered in the sigmoid mesocolon. 2. No evidence of new or progressive disease. 3. Hepatic steatosis. 4. Subcentimeter right renal angiomyolipoma, similar. 5.  Aortic Atherosclerosis (ICD10-I70.0). 6. Ventral abdominal wall laxity containing transverse colon, similar.   08/2018 Imaging   CT: IMPRESSION: 1. Nodules within the sigmoid mesocolon have decreased in size compared to prior. No new peritoneal or omental nodularity. 2. No evidence local recurrence the pelvis. 3. Ventral hernia contains a segment of transverse colon. No change from prior.     06/19/2019 Imaging   1. No  evidence of metastatic disease in the abdomen pelvis. 2. No peritoneal or omental metastasis identified.  No free fluid. 3. Postcholecystectomy and hysterectomy. Comparison exams are made available. Comparison CT 09/08/2018 and 05/27/2018. PET-CT 05/27/2018    There is a nodule within the proximal aspect of the sigmoid colon measuring 2.2 by 2.2 cm. In comparison to prior CTs and PET-CT there was a hypermetabolic nodule at this location on the PET-CT of 08/27/2017 and on the CT of 09/08/2018 there was a small residual nodule. At that time (09/08/2018) the nodule measured 1.4 by 1.3 cm. Therefore this nodule has increased in the interval and concerning for recurrence of a serosal implant. The previous described lymph nodes in the sigmoid mesocolon and more central mesentery are not increased in size and not pathologic by size criteria.    Concern for recurrence of serosal metastasis in the proximal sigmoid colon with a 2 cm enlarging lesion. Lesion extends into the lumen. No evidence of high-grade obstruction. Consider FDG PET scan and/or potential colonoscopy for evaluation.   06/29/2019 PET scan   1. Enlarging serosal implant within the proximal sigmoid colon has intense metabolic activity most consistent with malignancy. Lesion exhibits a somewhat indolent progression as present on PET-CT scan from 10/11/2017 and 05/27/2018. 2. Focal hypermetabolic activity within the RIGHT rectus muscle just off midline is slightly decreased from comparison exam. This may be benign inflammation related to prior laparotomy, however malignancy not excluded.   07/26/2019 Procedure   She had colonoscopy which  showed multiple polyps.  6 polyps were removed from the cecum, measuring 3 to 6 mm in size.  One 12 mm polyp was removed from ascending colon and another 1 measures 7 mm.  One 5 mm polyp was removed from the transverse colon.  There is tumor noted in the sigmoid colon, approximately 35 cm from the anus which was  biopsied.  The tumor appeared to be fungating, infiltrative and ulcerated but nonobstructive.  It encompassed approximately one third of the circumference of the lumen.   07/26/2019 Pathology Results   Multiple polyps came back tubular adenomas.  2 ascending polyps came back sessile serrated adenoma months.  Sigmoid colon biopsy confirmed metastatic high-grade serous carcinoma.  The morphology and immunophenotype are consistent with metastatic high-grade serous carcinoma from tubal/ovarian primary site.   08/17/2019 - 12/12/2019 Chemotherapy   The patient had carboplatin and taxol   10/23/2019 Imaging   1. Sigmoid lesion with diminished size/conspicuity difficult to measure on the previous exam. Adjacent lymph nodes and nodularity less than a cm, largest on coronal image 48 measuring 5 mm. 2. Right rectus muscle with some thickening with mildly convex margin seen posteriorly on image 72 of series 2. This could be due to postsurgical change, however, metastatic disease is not excluded. Attention on follow-up is suggested. 3. No evidence for new metastatic disease. 4. Small hiatal hernia. 5. 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   Aortic Atherosclerosis (ICD10-I70.0).   01/01/2020 Imaging   1. No signs of new disease. 2. Tiny lymph nodes in the area of concern in the LEFT lower quadrant z. 3. Added density and subtle contour irregularity involving the rectus muscles best seen on sagittal images, associated with site of prior surgical incision just to the RIGHT of midline (image 72, series 2 and image 58, series 5. Not significantly changed compared to prior studies. This measures approximately 2.1 x 1 cm in the sagittal plane and is less well-defined in the axial plane. Potentially postoperative change. Attention on follow-up. 4. Aortic atherosclerosis.   Aortic Atherosclerosis (ICD10-I70.0).       01/29/2020 -  Chemotherapy    Patient is on Treatment Plan: OVARIAN BEVACIZUMAB       05/06/2020 Imaging   1. Status post hysterectomy and oophorectomy. 2. No specific findings of recurrent or metastatic disease in the abdomen or pelvis. 3. No change in postoperative appearance of low midline incision. 4. Unchanged small prominent subcentimeter left iliac lymph nodes.     REVIEW OF SYSTEMS:   Constitutional: Denies fevers, chills or abnormal weight loss Eyes: Denies blurriness of vision Ears, nose, mouth, throat, and face: Denies mucositis or sore throat Respiratory: Denies cough, dyspnea or wheezes Cardiovascular: Denies palpitation, chest discomfort or lower extremity swelling Gastrointestinal:  Denies nausea, heartburn or change in bowel habits Skin: Denies abnormal skin rashes Lymphatics: Denies new lymphadenopathy or easy bruising Neurological:Denies numbness, tingling or new weaknesses Behavioral/Psych: Mood is stable, no new changes  All other systems were reviewed with the patient and are negative.  I have reviewed the past medical history, past surgical history, social history and family history with the patient and they are unchanged from previous note.  ALLERGIES:  is allergic to [employer] tartrate] and cortisone.  MEDICATIONS:  Current Outpatient Medications  Medication Sig Dispense Refill  . amLODipine (NORVASC) 10 MG tablet Take 1 tablet (10 mg total) by mouth daily. 30 tablet 11  . Armodafinil 250 MG tablet Take 1 tablet (250 mg total) by mouth daily. 90 tablet  0  . bevacizumab in sodium chloride 0.9 % 100 mL Inject into the vein once.    . busPIRone (BUSPAR) 7.5 MG tablet Take 15 mg by mouth 2 (two) times daily.    . chlorthalidone (HYGROTON) 25 MG tablet Take 25 mg by mouth daily.    . cholecalciferol (VITAMIN D3) 25 MCG (1000 UT) tablet Take 1,000 Units by mouth daily.    . colestipol (COLESTID) 1 g tablet Take 0.5-1 g by mouth daily. Pt taking 1/2 to 1 tablet daily depending on meal choice; taken 1 hour after morning meds , and can not eat  for another hour    . eszopiclone (LUNESTA) 2 MG TABS tablet Take immediately before bedtime 90 tablet 0  . FLUoxetine (PROZAC) 20 MG capsule Take 40 mg by mouth every morning. Takes with a 21m capsule for her total dose of 337m    . lidocaine-prilocaine (EMLA) cream Apply 1 application topically as needed. 30 g 1  . MELATONIN PO Take 10 mg by mouth at bedtime.     . metFORMIN (GLUCOPHAGE-XR) 500 MG 24 hr tablet Take 500 mg by mouth daily with breakfast.   3  . metoprolol tartrate (LOPRESSOR) 25 MG tablet Take 1 tablet (25 mg total) by mouth 2 (two) times daily. 60 tablet 3  . Multiple Vitamin (MULTIVITAMIN WITH MINERALS) TABS Take 1 tablet by mouth every morning.     . rosuvastatin (CRESTOR) 20 MG tablet Take 20 mg by mouth every morning.     . Marland Kitchenelmisartan (MICARDIS) 40 MG tablet Take 40 mg by mouth daily.     No current facility-administered medications for this visit.   Facility-Administered Medications Ordered in Other Visits  Medication Dose Route Frequency Provider Last Rate Last Admin  . sodium chloride flush (NS) 0.9 % injection 10 mL  10 mL Intravenous PRN GeNancy MarusMD   10 mL at 05/12/17 [id number]  PHYSICAL EXAMINATION: ECOG PERFORMANCE STATUS: 1 - Symptomatic but completely ambulatory  Vitals:   10/02/20 1154  BP: 130/90  Pulse: 66  Resp: 16  Temp: 97.8 F (36.6 C)  SpO2: 98%   Filed Weights   10/02/20 1154  Weight: 194 lb (88 kg)    GENERAL:alert, no distress and comfortable SKIN: skin color, texture, turgor are normal, no rashes or significant lesions EYES: normal, Conjunctiva are pink and non-injected, sclera clear OROPHARYNX:no exudate, no erythema and lips, buccal mucosa, and tongue normal  NECK: supple, thyroid normal size, non-tender, without nodularity LYMPH:  no palpable lymphadenopathy in the cervical, axillary or inguinal LUNGS: clear to auscultation and percussion with normal breathing effort HEART: regular rate & rhythm and no murmurs and no lower  extremity edema ABDOMEN:abdomen soft, non-tender and normal bowel sounds Musculoskeletal:no cyanosis of digits and no clubbing  NEURO: alert & oriented x 3 with fluent speech, no focal motor/sensory deficits  LABORATORY DATA:  I have reviewed the data as listed    Component Value Date/Time   NA 139 10/02/2020 1136   NA 141 12/02/2016 1219   K 4.0 10/02/2020 1136   K 3.7 12/02/2016 1219   CL 104 10/02/2020 1136   CL 103 01/18/2013 1329   CO2 27 10/02/2020 1136   CO2 27 12/02/2016 1219   GLUCOSE 106 (H) 10/02/2020 1136   GLUCOSE 98 12/02/2016 1219   GLUCOSE 110 (H) 01/18/2013 1329   BUN 15 10/02/2020 1136   BUN 15.3 12/02/2016 1219   CREATININE 0.73 10/02/2020 1136   CREATININE 0.73 07/30/2020 0716  CREATININE 0.7 12/02/2016 1219   CALCIUM 9.2 10/02/2020 1136   CALCIUM 10.1 12/02/2016 1219   PROT 7.0 10/02/2020 1136   PROT 7.1 12/02/2016 1219   ALBUMIN 4.1 10/02/2020 1136   ALBUMIN 4.2 12/02/2016 1219   AST 25 10/02/2020 1136   AST 29 07/30/2020 0716   AST 21 12/02/2016 1219   ALT 29 10/02/2020 1136   ALT 37 07/30/2020 0716   ALT 27 12/02/2016 1219   ALKPHOS 80 10/02/2020 1136   ALKPHOS 104 12/02/2016 1219   BILITOT 0.5 10/02/2020 1136   BILITOT 0.5 07/30/2020 0716   BILITOT 0.37 12/02/2016 1219   GFRNONAA >60 10/02/2020 1136   GFRNONAA >60 07/30/2020 0716   GFRAA >60 05/07/2020 0833    No results found for: SPEP, UPEP  Lab Results  Component Value Date   WBC 5.4 10/02/2020   NEUTROABS 3.0 10/02/2020   HGB 13.9 10/02/2020   HCT 41.9 10/02/2020   MCV 92.1 10/02/2020   PLT 129 (L) 10/02/2020      Chemistry      Component Value Date/Time   NA 139 10/02/2020 1136   NA 141 12/02/2016 1219   K 4.0 10/02/2020 1136   K 3.7 12/02/2016 1219   CL 104 10/02/2020 1136   CL 103 01/18/2013 1329   CO2 27 10/02/2020 1136   CO2 27 12/02/2016 1219   BUN 15 10/02/2020 1136   BUN 15.3 12/02/2016 1219   CREATININE 0.73 10/02/2020 1136   CREATININE 0.73 07/30/2020  0716   CREATININE 0.7 12/02/2016 1219   GLU 236 12/13/2012 1558      Component Value Date/Time   CALCIUM 9.2 10/02/2020 1136   CALCIUM 10.1 12/02/2016 1219   ALKPHOS 80 10/02/2020 1136   ALKPHOS 104 12/02/2016 1219   AST 25 10/02/2020 1136   AST 29 07/30/2020 0716   AST 21 12/02/2016 1219   ALT 29 10/02/2020 1136   ALT 37 07/30/2020 0716   ALT 27 12/02/2016 1219   BILITOT 0.5 10/02/2020 1136   BILITOT 0.5 07/30/2020 0716   BILITOT 0.37 12/02/2016 1219

## 2020-10-02 NOTE — Assessment & Plan Note (Signed)
Her last CT imaging showed no signs of cancer She has achieved complete response We discussed the role of maintenance therapy and she would like to continue I will see her back in 3 weeks for further follow-up Her next imaging study would be in March 2022

## 2020-10-02 NOTE — Patient Instructions (Signed)
[facility] Discharge Instructions for Patients Receiving Chemotherapy  Today you received the following chemotherapy agents: Bevacizumab (Avastin).  To help prevent nausea and vomiting after your treatment, we encourage you to take your nausea medication as prescribed.  If you develop nausea and vomiting that is not controlled by your nausea medication, call the clinic.   BELOW ARE SYMPTOMS THAT SHOULD BE REPORTED IMMEDIATELY:  *FEVER GREATER THAN 100.5 F  *CHILLS WITH OR WITHOUT FEVER  NAUSEA AND VOMITING THAT IS NOT CONTROLLED WITH YOUR NAUSEA MEDICATION  *UNUSUAL SHORTNESS OF BREATH  *UNUSUAL BRUISING OR BLEEDING  TENDERNESS IN MOUTH AND THROAT WITH OR WITHOUT PRESENCE OF ULCERS  *URINARY PROBLEMS  *BOWEL PROBLEMS  UNUSUAL RASH Items with * indicate a potential emergency and should be followed up as soon as possible.  Feel free to call the clinic should you have any questions or concerns. The clinic phone number is [phone].  Please show the CHEMO ALERT CARD at check-in to the Emergency Department and triage nurse.   

## 2020-10-02 NOTE — Assessment & Plan Note (Signed)
Her thrombocytopenia is stable Observe only for now 

## 2020-10-08 ENCOUNTER — Other Ambulatory Visit: Payer: Self-pay | Admitting: Hematology and Oncology

## 2020-10-17 DIAGNOSIS — E782 Mixed hyperlipidemia: Secondary | ICD-10-CM | POA: Diagnosis not present

## 2020-10-17 DIAGNOSIS — E1159 Type 2 diabetes mellitus with other circulatory complications: Secondary | ICD-10-CM | POA: Diagnosis not present

## 2020-10-17 DIAGNOSIS — I10 Essential (primary) hypertension: Secondary | ICD-10-CM | POA: Diagnosis not present

## 2020-10-17 DIAGNOSIS — M159 Polyosteoarthritis, unspecified: Secondary | ICD-10-CM | POA: Diagnosis not present

## 2020-10-21 ENCOUNTER — Other Ambulatory Visit: Payer: Self-pay | Admitting: Neurology

## 2020-10-21 ENCOUNTER — Other Ambulatory Visit: Payer: Self-pay | Admitting: Family Medicine

## 2020-10-21 DIAGNOSIS — F5102 Adjustment insomnia: Secondary | ICD-10-CM

## 2020-10-21 DIAGNOSIS — G4733 Obstructive sleep apnea (adult) (pediatric): Secondary | ICD-10-CM

## 2020-10-21 MED ORDER — ESZOPICLONE 2 MG PO TABS
ORAL_TABLET | ORAL | 1 refills | Status: DC
Start: 2020-10-21 — End: 2021-05-14

## 2020-10-21 MED ORDER — ESZOPICLONE 2 MG PO TABS
ORAL_TABLET | ORAL | 0 refills | Status: DC
Start: 2020-10-21 — End: 2020-10-21

## 2020-10-21 NOTE — Telephone Encounter (Signed)
Ally from [employer]. is requesting a refill for eszopiclone (LUNESTA) 2 MG TABS tablet.  Best contact: [phone]

## 2020-10-21 NOTE — Addendum Note (Signed)
Addended by: Brandon Melnick on: 10/21/2020 10:38 AM   Modules accepted: Orders

## 2020-10-22 ENCOUNTER — Ambulatory Visit (HOSPITAL_COMMUNITY)
Admission: RE | Admit: 2020-10-22 | Discharge: 2020-10-22 | Disposition: A | Discharge status: Home / Self Care | Payer: Medicare HMO | Source: Ambulatory Visit | Attending: Hematology and Oncology | Admitting: Hematology and Oncology

## 2020-10-22 ENCOUNTER — Other Ambulatory Visit: Payer: Self-pay

## 2020-10-22 DIAGNOSIS — Z8543 Personal history of malignant neoplasm of ovary: Secondary | ICD-10-CM | POA: Diagnosis not present

## 2020-10-22 DIAGNOSIS — D179 Benign lipomatous neoplasm, unspecified: Secondary | ICD-10-CM | POA: Diagnosis not present

## 2020-10-22 DIAGNOSIS — Z9889 Other specified postprocedural states: Secondary | ICD-10-CM | POA: Diagnosis not present

## 2020-10-22 DIAGNOSIS — C5700 Malignant neoplasm of unspecified fallopian tube: Secondary | ICD-10-CM | POA: Insufficient documentation

## 2020-10-22 DIAGNOSIS — Z90710 Acquired absence of both cervix and uterus: Secondary | ICD-10-CM | POA: Insufficient documentation

## 2020-10-22 DIAGNOSIS — K4090 Unilateral inguinal hernia, without obstruction or gangrene, not specified as recurrent: Secondary | ICD-10-CM | POA: Diagnosis not present

## 2020-10-22 MED ORDER — IOHEXOL 9 MG/ML PO SOLN: 500.0000 mL | ORAL | Status: AC

## 2020-10-22 MED ORDER — HEPARIN SOD (PORK) LOCK FLUSH 100 UNIT/ML IV SOLN
INTRAVENOUS | Status: AC
  Filled 2020-10-22: qty 5

## 2020-10-22 MED ORDER — IOHEXOL 9 MG/ML PO SOLN
ORAL | Status: AC
  Administered 2020-10-22: 500 mL via ORAL
  Filled 2020-10-22: qty 1000

## 2020-10-22 MED ORDER — IOHEXOL 300 MG/ML  SOLN
100.0000 mL | Freq: Once | INTRAMUSCULAR | Status: AC | PRN
  Administered 2020-10-22: 100 mL via INTRAVENOUS

## 2020-10-23 ENCOUNTER — Other Ambulatory Visit: Payer: Self-pay

## 2020-10-23 ENCOUNTER — Encounter: Payer: Self-pay | Admitting: Hematology and Oncology

## 2020-10-23 ENCOUNTER — Inpatient Hospital Stay: Payer: Medicare HMO

## 2020-10-23 ENCOUNTER — Inpatient Hospital Stay (HOSPITAL_BASED_OUTPATIENT_CLINIC_OR_DEPARTMENT_OTHER): Payer: Medicare HMO | Admitting: Hematology and Oncology

## 2020-10-23 ENCOUNTER — Inpatient Hospital Stay: Payer: Medicare HMO | Attending: Gynecologic Oncology

## 2020-10-23 ENCOUNTER — Telehealth: Payer: Self-pay | Admitting: Hematology and Oncology

## 2020-10-23 DIAGNOSIS — Z5112 Encounter for antineoplastic immunotherapy: Secondary | ICD-10-CM | POA: Insufficient documentation

## 2020-10-23 DIAGNOSIS — Z90710 Acquired absence of both cervix and uterus: Secondary | ICD-10-CM | POA: Insufficient documentation

## 2020-10-23 DIAGNOSIS — Z79899 Other long term (current) drug therapy: Secondary | ICD-10-CM | POA: Diagnosis not present

## 2020-10-23 DIAGNOSIS — R5383 Other fatigue: Secondary | ICD-10-CM

## 2020-10-23 DIAGNOSIS — Z7984 Long term (current) use of oral hypoglycemic drugs: Secondary | ICD-10-CM | POA: Diagnosis not present

## 2020-10-23 DIAGNOSIS — Z7189 Other specified counseling: Secondary | ICD-10-CM

## 2020-10-23 DIAGNOSIS — C5701 Malignant neoplasm of right fallopian tube: Secondary | ICD-10-CM

## 2020-10-23 DIAGNOSIS — Z9079 Acquired absence of other genital organ(s): Secondary | ICD-10-CM | POA: Insufficient documentation

## 2020-10-23 DIAGNOSIS — I10 Essential (primary) hypertension: Secondary | ICD-10-CM | POA: Diagnosis not present

## 2020-10-23 DIAGNOSIS — C5700 Malignant neoplasm of unspecified fallopian tube: Secondary | ICD-10-CM

## 2020-10-23 DIAGNOSIS — Z90722 Acquired absence of ovaries, bilateral: Secondary | ICD-10-CM | POA: Insufficient documentation

## 2020-10-23 DIAGNOSIS — M797 Fibromyalgia: Secondary | ICD-10-CM

## 2020-10-23 DIAGNOSIS — Z9049 Acquired absence of other specified parts of digestive tract: Secondary | ICD-10-CM | POA: Diagnosis not present

## 2020-10-23 LAB — COMPREHENSIVE METABOLIC PANEL
ALT: 29 U/L (ref 0–44)
AST: 23 U/L (ref 15–41)
Albumin: 3.9 g/dL (ref 3.5–5.0)
Alkaline Phosphatase: 88 U/L (ref 38–126)
Anion gap: 8 (ref 5–15)
BUN: 21 mg/dL (ref 8–23)
CO2: 27 mmol/L (ref 22–32)
Calcium: 9.1 mg/dL (ref 8.9–10.3)
Chloride: 103 mmol/L (ref 98–111)
Creatinine, Ser: 0.8 mg/dL (ref 0.44–1.00)
GFR, Estimated: >60 mL/min (ref >60)
Glucose, Bld: 110 mg/dL — ABNORMAL HIGH (ref 70–99)
Potassium: 4.1 mmol/L (ref 3.5–5.1)
Sodium: 138 mmol/L (ref 135–145)
Total Bilirubin: 0.4 mg/dL (ref 0.3–1.2)
Total Protein: 6.9 g/dL (ref 6.5–8.1)

## 2020-10-23 LAB — CBC WITH DIFFERENTIAL/PLATELET
Abs Immature Granulocytes: 0.02 10*3/uL (ref 0.00–0.07)
Basophils Absolute: 0 10*3/uL (ref 0.0–0.1)
Basophils Relative: 0 %
Eosinophils Absolute: 0.1 10*3/uL (ref 0.0–0.5)
Eosinophils Relative: 2 %
HCT: 40.3 % (ref 36.0–46.0)
Hemoglobin: 13.5 g/dL (ref 12.0–15.0)
Immature Granulocytes: 0 %
Lymphocytes Relative: 34 %
Lymphs Abs: 1.7 10*3/uL (ref 0.7–4.0)
MCH: 30.8 pg (ref 26.0–34.0)
MCHC: 33.5 g/dL (ref 30.0–36.0)
MCV: 92 fL (ref 80.0–100.0)
Monocytes Absolute: 0.5 10*3/uL (ref 0.1–1.0)
Monocytes Relative: 10 %
Neutro Abs: 2.7 10*3/uL (ref 1.7–7.7)
Neutrophils Relative %: 54 %
Platelets: 135 10*3/uL — ABNORMAL LOW (ref 150–400)
RBC: 4.38 MIL/uL (ref 3.87–5.11)
RDW: 13.2 % (ref 11.5–15.5)
WBC: 5.1 10*3/uL (ref 4.0–10.5)
nRBC: 0 % (ref 0.0–0.2)

## 2020-10-23 LAB — TOTAL PROTEIN, URINE DIPSTICK: Protein, ur: NEGATIVE mg/dL

## 2020-10-23 MED ORDER — SODIUM CHLORIDE 0.9 % IV SOLN
15.0000 mg/kg | Freq: Once | INTRAVENOUS | Status: AC
  Administered 2020-10-23: 1300 mg via INTRAVENOUS
  Filled 2020-10-23: qty 48

## 2020-10-23 MED ORDER — HEPARIN SOD (PORK) LOCK FLUSH 100 UNIT/ML IV SOLN
500.0000 [IU] | Freq: Once | INTRAVENOUS | Status: AC | PRN
  Administered 2020-10-23: 500 [IU]
  Filled 2020-10-23: qty 5

## 2020-10-23 MED ORDER — SODIUM CHLORIDE 0.9 % IV SOLN
Freq: Once | INTRAVENOUS | Status: AC
  Filled 2020-10-23: qty 250

## 2020-10-23 MED ORDER — SODIUM CHLORIDE 0.9% FLUSH
10.0000 mL | INTRAVENOUS | Status: DC | PRN
  Administered 2020-10-23: 10 mL
  Filled 2020-10-23: qty 10

## 2020-10-23 MED ORDER — SODIUM CHLORIDE 0.9% FLUSH
10.0000 mL | Freq: Once | INTRAVENOUS | Status: AC
  Administered 2020-10-23: 10 mL
  Filled 2020-10-23: qty 10

## 2020-10-23 NOTE — Telephone Encounter (Signed)
Scheduled appointments per 3/16 sch msg. Spoke to patient who is aware of appointments dates and times. Gave patient calendar print out.

## 2020-10-23 NOTE — Patient Instructions (Signed)
[facility] Discharge Instructions for Patients Receiving Chemotherapy  Today you received the following chemotherapy agents: Bevacizumab (Avastin).  To help prevent nausea and vomiting after your treatment, we encourage you to take your nausea medication as prescribed.  If you develop nausea and vomiting that is not controlled by your nausea medication, call the clinic.   BELOW ARE SYMPTOMS THAT SHOULD BE REPORTED IMMEDIATELY:  *FEVER GREATER THAN 100.5 F  *CHILLS WITH OR WITHOUT FEVER  NAUSEA AND VOMITING THAT IS NOT CONTROLLED WITH YOUR NAUSEA MEDICATION  *UNUSUAL SHORTNESS OF BREATH  *UNUSUAL BRUISING OR BLEEDING  TENDERNESS IN MOUTH AND THROAT WITH OR WITHOUT PRESENCE OF ULCERS  *URINARY PROBLEMS  *BOWEL PROBLEMS  UNUSUAL RASH Items with * indicate a potential emergency and should be followed up as soon as possible.  Feel free to call the clinic should you have any questions or concerns. The clinic phone number is [phone].  Please show the CHEMO ALERT CARD at check-in to the Emergency Department and triage nurse.   

## 2020-10-24 NOTE — Assessment & Plan Note (Signed)
She has recent excessive fatigue She is taking multiple different medications to help with sleep and fibromyalgia I suspect they might be the contributing factor, along with obstructive sleep apnea I do not believe the bevacizumab is the cause of her fatigue

## 2020-10-24 NOTE — Assessment & Plan Note (Signed)
Her blood pressure is well controlled She will continue close monitoring

## 2020-10-24 NOTE — Progress Notes (Signed)
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[facility]) CT imaging show no evidence of disease She is interested to continue on maintenance bevacizumab I do not recommend repeating CT imaging more than once a year unless she has new symptoms She will continue treatment every 3 weeks and I will see her every at the time  Essential hypertension Her blood pressure is well controlled She will continue close monitoring  Other fatigue She has recent excessive fatigue She is taking multiple different medications to help with sleep and fibromyalgia I suspect they might be the contributing factor, along with obstructive sleep apnea I do not believe the bevacizumab is the cause of her fatigue   No orders of the defined types were placed in this encounter.   All questions were answered. The patient knows to call the clinic with any problems, questions or concerns. The total time spent in the appointment was 20 minutes encounter with patients including review of chart and various tests results, discussions about plan of care and coordination of care plan   [name], MD 10/24/2020 7:55 AM  INTERVAL HISTORY: Please see below for problem oriented charting. She returns for treatment and follow-up She had nausea and vomiting while undergoing CT imaging which is unusual for her She complained of excessive fatigue recently Her blood pressure control at home is satisfactory  SUMMARY OF ONCOLOGIC HISTORY: Oncology History Overview Note  Oncologic Summary: 1. 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 Random CA 125 elevation January 2019  Question mesenteric nodules and anterior abdominal wall nodule 3. GeneDx Breast/Ovary Panel negative (including BRCA, MMR's, RAD51 etc) ? [facility] BRACAnalysis  Negative for BRCA1/2 in tumor   Fallopian tube carcinoma ([place])  11/01/2012 Imaging   Ct abdomen 1.  Interval development of large mid abdominal mass highly concerning for right ovarian cancer.  There is mild omental nodularity on the left, and peritoneal disease cannot be completely excluded.  There is no ascites or other evidence of metastatic disease. 2.  Mild associated renal pelvocaliectasis bilaterally without obstruction.  Nonobstructing left renal calculus and a small right renal angiomyolipoma noted incidentally.   11/08/2012 Pathology Results   1. 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 - BENIGN FALLOPIAN TUBE WITH NONSPECIFIC CHRONIC INFLAMMATION. 2. Ovary and fallopian tube, left - BENIGN OVARY; NEGATIVE FOR ATYPIA OR MALIGNANCY. - BENIGN FALLOPIAN TUBE; NEGATIVE FOR ATYPIA OR MALIGNANCY. 3. Omentum, resection for tumor - HIGH GRADE CARCINOMA, SEE COMMENT. 4. Appendix, Other than Incidental - FIBROUS OBLITERATION OF APPENDICEAL TIP. - NEGATIVE FOR MALIGNANCY.   11/08/2012 Surgery   Surgery: Exploratory laparotomy, bilateral salpingo-oophorectomy, appendectomy, infacolic omentectomy, optimal debulking  Surgeons:  Paola A. Alycia Rossetti, MD; Lahoma Crocker, MD   Assistant: Caswell Corwin  Pathology: Bilateral fallopian tubes and ovaries to pathology. Appendix as well as omentum. 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  Operative findings: 25 cm right adnexal mass with smooth surface. Surgically absent uterus.  Atrophic-appearing left  ovary. Normal appearing appendix. Within the omentum there were centimeter nodules scattered throughout the omentum. The remainder of the surfaces were benign.   12/08/2012 Procedure   Impression:  Placement of a subcutaneous port device.  The catheter tip is in the lower SVC and ready to be used.    12/13/2012 - 03/28/2013 Chemotherapy   s/p 6 cycles of paclitaxel and carboplatin   12/13/2012 - 03/28/2013 Chemotherapy   The patient had 6 cycles of carboplatin and Taxol   03/24/2013 Imaging   US abdomen   04/24/2013 Imaging   CT abdomen Interval resection of the large right pelvic and lower abdominal mass lesion with apparent omentectomy.  No evidence for intraperitoneal free fluid on today's study.  No discernible peritoneal lesions.  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   09/07/2013 Genetic Testing   Patient has genetic testing done for BRCA1/2 panel Results revealed patient has no mutation(s):   07/09/2015 Imaging   CT abdomen 1. 12 mm obstructive calculus at the left ureteropelvic junction with moderate proximal hydronephrosis. 2. 2 small supraumbilical ventral hernias, one containing a short segment of the mid transverse colon and the other containing a short segment of the mid small bowel. There is no associated evidence to suggest bowel incarceration or obstruction at this time. 3. Tiny locule of gas non dependently in the lumen of the urinary bladder. This is presumably iatrogenic related to recent catheterization for urinalysis. Alternatively, this could be seen in the setting of urinary tract infection with gas-forming organisms. Clinical correlation for history of recent catheterization is recommended. 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 5. Status post cholecystectomy. 6. Additional incidental findings, as above.    12/11/2016 Imaging   Ct abdomen 1. No evidence of metastatic ovarian cancer. 2. Recurrent subxiphoid  ventral abdominal wall hernia containing transverse colon. No evidence of incarceration or obstruction. 3. Stable incidental findings in the liver and kidneys. No recurrent urinary tract calculus. 4. Progressive lower lumbar spondylosis. 5.  Aortic Atherosclerosis (ICD10-I70.0).    08/30/2017 Imaging   MRI thoracic spine 1. At T5-6 there is a small central disc protrusion contacting the ventral thoracic spinal cord. No central canal or foraminal stenosis. 2. At T9-10 there is a small right paracentral disc protrusion. 3.  No acute osseous injury of the thoracic spine. 4. No aggressive osseous lesion to suggest metastatic disease.   09/24/2017 Tumor Marker   Patient's tumor was tested for the following markers: CA-125 Results of the tumor marker test revealed 21.7   09/30/2017 Imaging   CT abdomen 1. New small clustered soft tissue nodules in the left lower quadrant in the sigmoid mesentery, largest 1.0 cm, which could represent recurrent peritoneal tumor implants. No ascites.  2. Midline high ventral abdominal wall hernia containing a portion of the transverse colon is mildly increased in size, and without bowel complication at this time. 3. Chronic findings include: Aortic Atherosclerosis (ICD10-I70.0). Diffuse hepatic steatosis. 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   10/11/2017 PET scan   1. Nodules in the sigmoid mesentery are hypermetabolic and highly worrisome for metastatic disease. 2. Attic steatosis.    11/03/2017 Procedure   Successful CT-guided rectus abdominal muscle mass core biopsy. Path: - FOREIGN BODY GIANT CELL REACTION INVOLVING FIBROADIPOSE TISSUE AND SKELETAL MUSCLE. - NO EVIDENCE OF MALIGNANCY.   11/04/2017 Cancer Staging   Staging form: Fallopian Tube, AJCC 7th Edition - Clinical: FIGO Stage IIIC, calculated as Stage IV (rT3,  N0, M1) - Signed by [name], MD on 08/09/2019   02/2018 Imaging   CT: 1.  Continued increase in size small peritoneal nodules along the mesenteric border of the proximal sigmoid colon as well as along the serosal surface of the proximal sigmoid colon. 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 2. No evidence of distant disease. 3. Stable large ventral hernia.   05/2018 Imaging   PET: 1. Redemonstration of hypermetabolic nodules within the sigmoid mesocolon. Mild response to therapy relative to CT of 02/24/2018. Mixed response to therapy compared to the most recent PET of 10/11/2017. 2. Hypermetabolism within the right pelvic rectus musculature, increased since the prior PET.  Clinical service requested comparison to the 11/24/2017 CT. Index 10 mm nodule within the sigmoid mesocolon was similar to the 02/24/2018 CT, and as described on that exam, increased from 7 mm on 11/24/2017. More inferior nodule within the mesocolon measures 12 mm today on image 156/4 and 8 mm on 11/24/2017.   05/2018 Imaging   CT: IMPRESSION: 1. Since 02/24/2018, decreased size of peritoneal nodules centered in the sigmoid mesocolon. 2. No evidence of new or progressive disease. 3. Hepatic steatosis. 4. Subcentimeter right renal angiomyolipoma, similar. 5.  Aortic Atherosclerosis (ICD10-I70.0). 6. Ventral abdominal wall laxity containing transverse colon, similar.   08/2018 Imaging   CT: IMPRESSION: 1. Nodules within the sigmoid mesocolon have decreased in size compared to prior. No new peritoneal or omental nodularity. 2. No evidence local recurrence the pelvis. 3. Ventral hernia contains a segment of transverse colon. No change from prior.     06/19/2019 Imaging   1. No evidence of metastatic disease in the abdomen pelvis. 2. No peritoneal or omental metastasis identified.  No free fluid. 3. Postcholecystectomy and hysterectomy. Comparison exams are made available. Comparison CT 09/08/2018 and 05/27/2018. PET-CT 05/27/2018    There is a nodule  within the proximal aspect of the sigmoid colon measuring 2.2 by 2.2 cm. In comparison to prior CTs and PET-CT there was a hypermetabolic nodule at this location on the PET-CT of 08/27/2017 and on the CT of 09/08/2018 there was a small residual nodule. At that time (09/08/2018) the nodule measured 1.4 by 1.3 cm. Therefore this nodule has increased in the interval and concerning for recurrence of a serosal implant. The previous described lymph nodes in the sigmoid mesocolon and more central mesentery are not increased in size and not pathologic by size criteria.    Concern for recurrence of serosal metastasis in the proximal sigmoid colon with a 2 cm enlarging lesion. Lesion extends into the lumen. No evidence of high-grade obstruction. Consider FDG PET scan and/or potential colonoscopy for evaluation.   06/29/2019 PET scan   1. Enlarging serosal implant within the proximal sigmoid colon has intense metabolic activity most consistent with malignancy. Lesion exhibits a somewhat indolent progression as present on PET-CT scan from 10/11/2017 and 05/27/2018. 2. Focal hypermetabolic activity within the RIGHT rectus muscle just off midline is slightly decreased from comparison exam. This may be benign inflammation related to prior laparotomy, however malignancy not excluded.   07/26/2019 Procedure   She had colonoscopy which showed multiple polyps.  6 polyps were removed from the cecum, measuring 3 to 6 mm in size.  One 12 mm polyp was removed from ascending colon and another 1 measures 7 mm.  One 5 mm polyp was removed from the transverse colon.  There is tumor noted in the sigmoid colon, approximately 35 cm from the anus which was biopsied.  The tumor appeared to be fungating, infiltrative and ulcerated but nonobstructive.  It encompassed approximately one third of the circumference of the lumen.   07/26/2019 Pathology Results   Multiple polyps came back tubular adenomas.  2 ascending polyps came back  sessile serrated adenoma months.  Sigmoid colon biopsy confirmed metastatic high-grade serous carcinoma.  The morphology and immunophenotype are consistent with metastatic high-grade serous carcinoma from tubal/ovarian primary site.   08/17/2019 - 12/12/2019 Chemotherapy   The patient had carboplatin and taxol   10/23/2019 Imaging   1. Sigmoid lesion with diminished size/conspicuity difficult to measure on the previous exam. Adjacent lymph nodes and nodularity less than a cm, largest on coronal image 48 measuring 5 mm. 2. Right rectus muscle with some thickening with mildly convex margin seen posteriorly on image 72 of series 2. This could be due to postsurgical change, however, metastatic disease is not excluded. Attention on follow-up is suggested. 3. No evidence for new metastatic disease. 4. Small hiatal hernia. 5. 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   Aortic Atherosclerosis (ICD10-I70.0).   01/01/2020 Imaging   1. No signs of new disease. 2. Tiny lymph nodes in the area of concern in the LEFT lower quadrant z. 3. Added density and subtle contour irregularity involving the rectus muscles best seen on sagittal images, associated with site of prior surgical incision just to the RIGHT of midline (image 72, series 2 and image 58, series 5. Not significantly changed compared to prior studies. This measures approximately 2.1 x 1 cm in the sagittal plane and is less well-defined in the axial plane. Potentially postoperative change. Attention on follow-up. 4. Aortic atherosclerosis.   Aortic Atherosclerosis (ICD10-I70.0).       01/29/2020 -  Chemotherapy    Patient is on Treatment Plan: OVARIAN BEVACIZUMAB      05/06/2020 Imaging   1. Status post hysterectomy and oophorectomy. 2. No specific findings of recurrent or metastatic disease in the abdomen or pelvis. 3. No change in postoperative appearance of low midline incision. 4. Unchanged small prominent subcentimeter left iliac lymph  nodes.   10/22/2020 Imaging   1. Status post hysterectomy and oophorectomy. No evidence of recurrent or metastatic disease within the abdomen or pelvis. 2. No significant change in the subcentimeter prominent left iliac lymph nodes.     REVIEW OF SYSTEMS:   Constitutional: Denies fevers, chills or abnormal weight loss Eyes: Denies blurriness of vision Ears, nose, mouth, throat, and face: Denies mucositis or sore throat Respiratory: Denies cough, dyspnea or wheezes Cardiovascular: Denies palpitation, chest discomfort or lower extremity swelling Gastrointestinal:  Denies nausea, heartburn or change in bowel habits Skin: Denies abnormal skin rashes Lymphatics: Denies new lymphadenopathy or easy bruising Neurological:Denies numbness, tingling or new weaknesses Behavioral/Psych: Mood is stable, no new changes  All other systems were reviewed with the patient and are negative.  I have reviewed the past medical history, past surgical history, social history and family history with the patient and they are unchanged from previous note.  ALLERGIES:  is allergic to [employer] tartrate] and cortisone.  MEDICATIONS:  Current Outpatient Medications  Medication Sig Dispense Refill  . amLODipine (NORVASC) 10 MG tablet Take 1 tablet (10 mg total) by mouth daily. 30 tablet 11  . Armodafinil 250 MG tablet Take 1 tablet (250 mg total) by mouth daily. 90 tablet 0  . bevacizumab in sodium chloride 0.9 % 100 mL Inject into the vein once.    . busPIRone (BUSPAR) 7.5 MG tablet Take 15 mg  by mouth 2 (two) times daily.    . chlorthalidone (HYGROTON) 25 MG tablet Take 25 mg by mouth daily.    . cholecalciferol (VITAMIN D3) 25 MCG (1000 UT) tablet Take 1,000 Units by mouth daily.    . colestipol (COLESTID) 1 g tablet Take 0.5-1 g by mouth daily. Pt taking 1/2 to 1 tablet daily depending on meal choice; taken 1 hour after morning meds , and can not eat for another hour    . eszopiclone (LUNESTA) 2 MG TABS  tablet Take immediately before bedtime 90 tablet 1  . FLUoxetine (PROZAC) 20 MG capsule Take 40 mg by mouth every morning. Takes with a 34m capsule for her total dose of 3106m    . lidocaine-prilocaine (EMLA) cream Apply 1 application topically as needed. 30 g 1  . MELATONIN PO Take 10 mg by mouth at bedtime.     . metFORMIN (GLUCOPHAGE-XR) 500 MG 24 hr tablet Take 500 mg by mouth daily with breakfast.   3  . metoprolol tartrate (LOPRESSOR) 25 MG tablet Take 1 tablet (25 mg total) by mouth 2 (two) times daily. 60 tablet 3  . Multiple Vitamin (MULTIVITAMIN WITH MINERALS) TABS Take 1 tablet by mouth every morning.     . rosuvastatin (CRESTOR) 20 MG tablet Take 20 mg by mouth every morning.     . Marland Kitchenelmisartan (MICARDIS) 40 MG tablet Take 40 mg by mouth daily.     No current facility-administered medications for this visit.   Facility-Administered Medications Ordered in Other Visits  Medication Dose Route Frequency Provider Last Rate Last Admin  . sodium chloride flush (NS) 0.9 % injection 10 mL  10 mL Intravenous PRN GeNancy MarusMD   10 mL at 05/12/17 [id number]  PHYSICAL EXAMINATION: ECOG PERFORMANCE STATUS: 1 - Symptomatic but completely ambulatory  Vitals:   10/23/20 1128  BP: 134/68  Pulse: 68  Resp: 17  Temp: (!) 97.2 F (36.2 C)  SpO2: 98%   Filed Weights   10/23/20 1128  Weight: 196 lb 9.6 oz (89.2 kg)    GENERAL:alert, no distress and comfortable SKIN: skin color, texture, turgor are normal, no rashes or significant lesions EYES: normal, Conjunctiva are pink and non-injected, sclera clear OROPHARYNX:no exudate, no erythema and lips, buccal mucosa, and tongue normal  NECK: supple, thyroid normal size, non-tender, without nodularity LYMPH:  no palpable lymphadenopathy in the cervical, axillary or inguinal LUNGS: clear to auscultation and percussion with normal breathing effort HEART: regular rate & rhythm and no murmurs and no lower extremity edema ABDOMEN:abdomen soft,  non-tender and normal bowel sounds Musculoskeletal:no cyanosis of digits and no clubbing  NEURO: alert & oriented x 3 with fluent speech, no focal motor/sensory deficits  LABORATORY DATA:  I have reviewed the data as listed    Component Value Date/Time   NA 138 10/23/2020 1123   NA 141 12/02/2016 1219   K 4.1 10/23/2020 1123   K 3.7 12/02/2016 1219   CL 103 10/23/2020 1123   CL 103 01/18/2013 1329   CO2 27 10/23/2020 1123   CO2 27 12/02/2016 1219   GLUCOSE 110 (H) 10/23/2020 1123   GLUCOSE 98 12/02/2016 1219   GLUCOSE 110 (H) 01/18/2013 1329   BUN 21 10/23/2020 1123   BUN 15.3 12/02/2016 1219   CREATININE 0.80 10/23/2020 1123   CREATININE 0.73 07/30/2020 0716   CREATININE 0.7 12/02/2016 1219   CALCIUM 9.1 10/23/2020 1123   CALCIUM 10.1 12/02/2016 1219   PROT 6.9 10/23/2020 1123  PROT 7.1 12/02/2016 1219   ALBUMIN 3.9 10/23/2020 1123   ALBUMIN 4.2 12/02/2016 1219   AST 23 10/23/2020 1123   AST 29 07/30/2020 0716   AST 21 12/02/2016 1219   ALT 29 10/23/2020 1123   ALT 37 07/30/2020 0716   ALT 27 12/02/2016 1219   ALKPHOS 88 10/23/2020 1123   ALKPHOS 104 12/02/2016 1219   BILITOT 0.4 10/23/2020 1123   BILITOT 0.5 07/30/2020 0716   BILITOT 0.37 12/02/2016 1219   GFRNONAA >60 10/23/2020 1123   GFRNONAA >60 07/30/2020 0716   GFRAA >60 05/07/2020 0833    No results found for: SPEP, UPEP  Lab Results  Component Value Date   WBC 5.1 10/23/2020   NEUTROABS 2.7 10/23/2020   HGB 13.5 10/23/2020   HCT 40.3 10/23/2020   MCV 92.0 10/23/2020   PLT 135 (L) 10/23/2020      Chemistry      Component Value Date/Time   NA 138 10/23/2020 1123   NA 141 12/02/2016 1219   K 4.1 10/23/2020 1123   K 3.7 12/02/2016 1219   CL 103 10/23/2020 1123   CL 103 01/18/2013 1329   CO2 27 10/23/2020 1123   CO2 27 12/02/2016 1219   BUN 21 10/23/2020 1123   BUN 15.3 12/02/2016 1219   CREATININE 0.80 10/23/2020 1123   CREATININE 0.73 07/30/2020 0716   CREATININE 0.7 12/02/2016 1219    GLU 236 12/13/2012 1558      Component Value Date/Time   CALCIUM 9.1 10/23/2020 1123   CALCIUM 10.1 12/02/2016 1219   ALKPHOS 88 10/23/2020 1123   ALKPHOS 104 12/02/2016 1219   AST 23 10/23/2020 1123   AST 29 07/30/2020 0716   AST 21 12/02/2016 1219   ALT 29 10/23/2020 1123   ALT 37 07/30/2020 0716   ALT 27 12/02/2016 1219   BILITOT 0.4 10/23/2020 1123   BILITOT 0.5 07/30/2020 0716   BILITOT 0.37 12/02/2016 1219       RADIOGRAPHIC STUDIES: I have personally reviewed the radiological images as listed and agreed with the findings in the report. CT ABDOMEN PELVIS W CONTRAST  Result Date: 10/23/2020 CLINICAL DATA:  History of ovarian cancer assess treatment response. EXAM: CT ABDOMEN AND PELVIS WITH CONTRAST TECHNIQUE: Multidetector CT imaging of the abdomen and pelvis was performed using the standard protocol following bolus administration of intravenous contrast. CONTRAST:  154m OMNIPAQUE IOHEXOL 300 MG/ML  SOLN COMPARISON:  Multiple priors including most recent CT abdomen pelvis May 06, 2020. FINDINGS: Lower chest: No acute abnormality. Calcifications of the mitral annulus. Hepatobiliary: No suspicious hepatic lesion. Gallbladder surgically absent. Reservoir effect of biliary tree. Pancreas: Unremarkable. Spleen: Unremarkable. Adrenals/Urinary Tract: Bilateral adrenal glands are unremarkable. Similar size of the right angiomyolipoma now measuring 1.1 cm on image 36/2. Tiny bilateral hypodense renal lesions which are technically too small to accurately characterize but favored represent cysts. No hydronephrosis. Bladder is grossly unremarkable for degree of distension. Stomach/Bowel: Stomach is within normal limits. Scattered colonic diverticulosis without findings of acute diverticulitis. No evidence of bowel wall thickening, distention, or inflammatory changes. Vascular/Lymphatic: No significant vascular findings are present. No pathologically enlarged abdominal or pelvic lymph nodes.  Similar prominent subcentimeter left iliac lymph nodes image 65/2. Reproductive: Status post hysterectomy and oophorectomy. Other: Stable postsurgical changes in the anterior abdominal wall. Small left fat containing inguinal hernia. Musculoskeletal: No acute or significant osseous findings. IMPRESSION: 1. Status post hysterectomy and oophorectomy. No evidence of recurrent or metastatic disease within the abdomen or pelvis. 2. No significant  change in the subcentimeter prominent left iliac lymph nodes. Electronically Signed   By: Dahlia Bailiff MD   On: 10/23/2020 11:10

## 2020-10-24 NOTE — Assessment & Plan Note (Signed)
CT imaging show no evidence of disease She is interested to continue on maintenance bevacizumab I do not recommend repeating CT imaging more than once a year unless she has new symptoms She will continue treatment every 3 weeks and I will see her every at the time

## 2020-11-13 ENCOUNTER — Inpatient Hospital Stay: Payer: Medicare HMO

## 2020-11-13 ENCOUNTER — Inpatient Hospital Stay: Payer: Medicare HMO | Attending: Gynecologic Oncology

## 2020-11-13 ENCOUNTER — Other Ambulatory Visit: Payer: Self-pay

## 2020-11-13 VITALS — BP 140/83 | HR 72 | Temp 97.6°F | Resp 20 | Wt 196.0 lb

## 2020-11-13 DIAGNOSIS — C5700 Malignant neoplasm of unspecified fallopian tube: Secondary | ICD-10-CM

## 2020-11-13 DIAGNOSIS — D696 Thrombocytopenia, unspecified: Secondary | ICD-10-CM | POA: Insufficient documentation

## 2020-11-13 DIAGNOSIS — C5701 Malignant neoplasm of right fallopian tube: Secondary | ICD-10-CM

## 2020-11-13 DIAGNOSIS — I10 Essential (primary) hypertension: Secondary | ICD-10-CM | POA: Insufficient documentation

## 2020-11-13 DIAGNOSIS — Z9221 Personal history of antineoplastic chemotherapy: Secondary | ICD-10-CM | POA: Insufficient documentation

## 2020-11-13 DIAGNOSIS — E1159 Type 2 diabetes mellitus with other circulatory complications: Secondary | ICD-10-CM | POA: Diagnosis not present

## 2020-11-13 DIAGNOSIS — M159 Polyosteoarthritis, unspecified: Secondary | ICD-10-CM | POA: Diagnosis not present

## 2020-11-13 DIAGNOSIS — Z7189 Other specified counseling: Secondary | ICD-10-CM

## 2020-11-13 DIAGNOSIS — E782 Mixed hyperlipidemia: Secondary | ICD-10-CM | POA: Diagnosis not present

## 2020-11-13 DIAGNOSIS — Z90710 Acquired absence of both cervix and uterus: Secondary | ICD-10-CM | POA: Insufficient documentation

## 2020-11-13 DIAGNOSIS — Z90722 Acquired absence of ovaries, bilateral: Secondary | ICD-10-CM | POA: Insufficient documentation

## 2020-11-13 DIAGNOSIS — Z5112 Encounter for antineoplastic immunotherapy: Secondary | ICD-10-CM | POA: Insufficient documentation

## 2020-11-13 LAB — CBC WITH DIFFERENTIAL/PLATELET
Abs Immature Granulocytes: 0.01 K/uL (ref 0.00–0.07)
Basophils Absolute: 0 K/uL (ref 0.0–0.1)
Basophils Relative: 1 %
Eosinophils Absolute: 0.1 K/uL (ref 0.0–0.5)
Eosinophils Relative: 2 %
HCT: 42.8 % (ref 36.0–46.0)
Hemoglobin: 14.1 g/dL (ref 12.0–15.0)
Immature Granulocytes: 0 %
Lymphocytes Relative: 34 %
Lymphs Abs: 1.7 K/uL (ref 0.7–4.0)
MCH: 30.1 pg (ref 26.0–34.0)
MCHC: 32.9 g/dL (ref 30.0–36.0)
MCV: 91.5 fL (ref 80.0–100.0)
Monocytes Absolute: 0.5 K/uL (ref 0.1–1.0)
Monocytes Relative: 10 %
Neutro Abs: 2.6 K/uL (ref 1.7–7.7)
Neutrophils Relative %: 53 %
Platelets: 132 K/uL — ABNORMAL LOW (ref 150–400)
RBC: 4.68 MIL/uL (ref 3.87–5.11)
RDW: 13.2 % (ref 11.5–15.5)
WBC: 4.9 K/uL (ref 4.0–10.5)
nRBC: 0 % (ref 0.0–0.2)

## 2020-11-13 LAB — TOTAL PROTEIN, URINE DIPSTICK: Protein, ur: NEGATIVE mg/dL

## 2020-11-13 LAB — COMPREHENSIVE METABOLIC PANEL WITH GFR
ALT: 32 U/L (ref 0–44)
AST: 27 U/L (ref 15–41)
Albumin: 4.2 g/dL (ref 3.5–5.0)
Alkaline Phosphatase: 82 U/L (ref 38–126)
Anion gap: 12 (ref 5–15)
BUN: 24 mg/dL — ABNORMAL HIGH (ref 8–23)
CO2: 25 mmol/L (ref 22–32)
Calcium: 9.8 mg/dL (ref 8.9–10.3)
Chloride: 102 mmol/L (ref 98–111)
Creatinine, Ser: 0.76 mg/dL (ref 0.44–1.00)
GFR, Estimated: >60 mL/min
Glucose, Bld: 107 mg/dL — ABNORMAL HIGH (ref 70–99)
Potassium: 4.3 mmol/L (ref 3.5–5.1)
Sodium: 139 mmol/L (ref 135–145)
Total Bilirubin: 0.5 mg/dL (ref 0.3–1.2)
Total Protein: 7.2 g/dL (ref 6.5–8.1)

## 2020-11-13 MED ORDER — SODIUM CHLORIDE 0.9% FLUSH
10.0000 mL | INTRAVENOUS | Status: DC | PRN
  Administered 2020-11-13: 10 mL
  Filled 2020-11-13: qty 10

## 2020-11-13 MED ORDER — HEPARIN SOD (PORK) LOCK FLUSH 100 UNIT/ML IV SOLN
500.0000 [IU] | Freq: Once | INTRAVENOUS | Status: AC | PRN
  Administered 2020-11-13: 500 [IU]
  Filled 2020-11-13: qty 5

## 2020-11-13 MED ORDER — SODIUM CHLORIDE 0.9 % IV SOLN
15.0000 mg/kg | Freq: Once | INTRAVENOUS | Status: AC
  Administered 2020-11-13: 1300 mg via INTRAVENOUS
  Filled 2020-11-13: qty 48

## 2020-11-13 MED ORDER — SODIUM CHLORIDE 0.9% FLUSH
10.0000 mL | Freq: Once | INTRAVENOUS | Status: AC
  Administered 2020-11-13: 10 mL
  Filled 2020-11-13: qty 10

## 2020-11-13 MED ORDER — SODIUM CHLORIDE 0.9 % IV SOLN
Freq: Once | INTRAVENOUS | Status: AC
  Filled 2020-11-13: qty 250

## 2020-11-13 NOTE — Patient Instructions (Signed)

## 2020-11-13 NOTE — Patient Instructions (Signed)
[facility] Discharge Instructions for Patients Receiving Chemotherapy  Today you received the following chemotherapy agents: Bevacizumab (Avastin).  To help prevent nausea and vomiting after your treatment, we encourage you to take your nausea medication as prescribed.  If you develop nausea and vomiting that is not controlled by your nausea medication, call the clinic.   BELOW ARE SYMPTOMS THAT SHOULD BE REPORTED IMMEDIATELY:  *FEVER GREATER THAN 100.5 F  *CHILLS WITH OR WITHOUT FEVER  NAUSEA AND VOMITING THAT IS NOT CONTROLLED WITH YOUR NAUSEA MEDICATION  *UNUSUAL SHORTNESS OF BREATH  *UNUSUAL BRUISING OR BLEEDING  TENDERNESS IN MOUTH AND THROAT WITH OR WITHOUT PRESENCE OF ULCERS  *URINARY PROBLEMS  *BOWEL PROBLEMS  UNUSUAL RASH Items with * indicate a potential emergency and should be followed up as soon as possible.  Feel free to call the clinic should you have any questions or concerns. The clinic phone number is [phone].  Please show the CHEMO ALERT CARD at check-in to the Emergency Department and triage nurse.   

## 2020-12-03 ENCOUNTER — Inpatient Hospital Stay: Payer: Medicare HMO

## 2020-12-03 ENCOUNTER — Other Ambulatory Visit: Payer: Self-pay

## 2020-12-03 ENCOUNTER — Encounter: Payer: Self-pay | Admitting: Hematology and Oncology

## 2020-12-03 ENCOUNTER — Inpatient Hospital Stay (HOSPITAL_BASED_OUTPATIENT_CLINIC_OR_DEPARTMENT_OTHER): Payer: Medicare HMO | Admitting: Hematology and Oncology

## 2020-12-03 DIAGNOSIS — D696 Thrombocytopenia, unspecified: Secondary | ICD-10-CM | POA: Diagnosis not present

## 2020-12-03 DIAGNOSIS — Z7189 Other specified counseling: Secondary | ICD-10-CM

## 2020-12-03 DIAGNOSIS — Z90710 Acquired absence of both cervix and uterus: Secondary | ICD-10-CM | POA: Diagnosis not present

## 2020-12-03 DIAGNOSIS — Z9221 Personal history of antineoplastic chemotherapy: Secondary | ICD-10-CM | POA: Diagnosis not present

## 2020-12-03 DIAGNOSIS — I10 Essential (primary) hypertension: Secondary | ICD-10-CM

## 2020-12-03 DIAGNOSIS — C5701 Malignant neoplasm of right fallopian tube: Secondary | ICD-10-CM | POA: Diagnosis not present

## 2020-12-03 DIAGNOSIS — C5700 Malignant neoplasm of unspecified fallopian tube: Secondary | ICD-10-CM

## 2020-12-03 DIAGNOSIS — Z5112 Encounter for antineoplastic immunotherapy: Secondary | ICD-10-CM | POA: Diagnosis not present

## 2020-12-03 DIAGNOSIS — Z90722 Acquired absence of ovaries, bilateral: Secondary | ICD-10-CM | POA: Diagnosis not present

## 2020-12-03 LAB — COMPREHENSIVE METABOLIC PANEL
ALT: 35 U/L (ref 0–44)
AST: 31 U/L (ref 15–41)
Albumin: 4.1 g/dL (ref 3.5–5.0)
Alkaline Phosphatase: 83 U/L (ref 38–126)
Anion gap: 13 (ref 5–15)
BUN: 15 mg/dL (ref 8–23)
CO2: 26 mmol/L (ref 22–32)
Calcium: 10.2 mg/dL (ref 8.9–10.3)
Chloride: 103 mmol/L (ref 98–111)
Creatinine, Ser: 0.76 mg/dL (ref 0.44–1.00)
GFR, Estimated: >60 mL/min (ref >60)
Glucose, Bld: 100 mg/dL — ABNORMAL HIGH (ref 70–99)
Potassium: 4.2 mmol/L (ref 3.5–5.1)
Sodium: 142 mmol/L (ref 135–145)
Total Bilirubin: 0.6 mg/dL (ref 0.3–1.2)
Total Protein: 7.1 g/dL (ref 6.5–8.1)

## 2020-12-03 LAB — CBC WITH DIFFERENTIAL/PLATELET
Abs Immature Granulocytes: 0.02 10*3/uL (ref 0.00–0.07)
Basophils Absolute: 0 10*3/uL (ref 0.0–0.1)
Basophils Relative: 1 %
Eosinophils Absolute: 0.1 10*3/uL (ref 0.0–0.5)
Eosinophils Relative: 2 %
HCT: 41.7 % (ref 36.0–46.0)
Hemoglobin: 13.9 g/dL (ref 12.0–15.0)
Immature Granulocytes: 0 %
Lymphocytes Relative: 33 %
Lymphs Abs: 1.7 10*3/uL (ref 0.7–4.0)
MCH: 30.5 pg (ref 26.0–34.0)
MCHC: 33.3 g/dL (ref 30.0–36.0)
MCV: 91.6 fL (ref 80.0–100.0)
Monocytes Absolute: 0.5 10*3/uL (ref 0.1–1.0)
Monocytes Relative: 9 %
Neutro Abs: 2.8 10*3/uL (ref 1.7–7.7)
Neutrophils Relative %: 55 %
Platelets: 125 10*3/uL — ABNORMAL LOW (ref 150–400)
RBC: 4.55 MIL/uL (ref 3.87–5.11)
RDW: 13.2 % (ref 11.5–15.5)
WBC: 5.2 10*3/uL (ref 4.0–10.5)
nRBC: 0 % (ref 0.0–0.2)

## 2020-12-03 LAB — TOTAL PROTEIN, URINE DIPSTICK: Protein, ur: NEGATIVE mg/dL

## 2020-12-03 MED ORDER — SODIUM CHLORIDE 0.9 % IV SOLN
15.0000 mg/kg | Freq: Once | INTRAVENOUS | Status: AC
  Administered 2020-12-03: 1300 mg via INTRAVENOUS
  Filled 2020-12-03: qty 48

## 2020-12-03 MED ORDER — SODIUM CHLORIDE 0.9% FLUSH
10.0000 mL | Freq: Once | INTRAVENOUS | Status: AC
  Administered 2020-12-03: 10 mL
  Filled 2020-12-03: qty 10

## 2020-12-03 MED ORDER — SODIUM CHLORIDE 0.9 % IV SOLN
Freq: Once | INTRAVENOUS | Status: AC
  Filled 2020-12-03: qty 250

## 2020-12-03 MED ORDER — SODIUM CHLORIDE 0.9% FLUSH
10.0000 mL | INTRAVENOUS | Status: DC | PRN
  Administered 2020-12-03: 10 mL
  Filled 2020-12-03: qty 10

## 2020-12-03 MED ORDER — HEPARIN SOD (PORK) LOCK FLUSH 100 UNIT/ML IV SOLN
500.0000 [IU] | Freq: Once | INTRAVENOUS | Status: AC | PRN
  Administered 2020-12-03: 500 [IU]
  Filled 2020-12-03: qty 5

## 2020-12-03 NOTE — Patient Instructions (Signed)
[facility]COL[facility]  Discharge Instructions: Thank you for choosing [facility] to provide your oncology and hematology care.   If you have a lab appointment with the Cancer Center, please go directly to the Cancer Center and check in at the registration area.   Wear comfortable clothing and clothing appropriate for easy access to any Portacath or PICC line.   We strive to give you quality time with your provider. You may need to reschedule your appointment if you arrive late (15 or more minutes).  Arriving late affects you and other patients whose appointments are after yours.  Also, if you miss three or more appointments without notifying the office, you may be dismissed from the clinic at the provider's discretion.      For prescription refill requests, have your pharmacy contact our office and allow 72 hours for refills to be completed.    Today you received the following chemotherapy and/or immunotherapy agents Avastin       To help prevent nausea and vomiting after your treatment, we encourage you to take your nausea medication as directed.  BELOW ARE SYMPTOMS THAT SHOULD BE REPORTED IMMEDIATELY: *FEVER GREATER THAN 100.4 F (38 C) OR HIGHER *CHILLS OR SWEATING *NAUSEA AND VOMITING THAT IS NOT CONTROLLED WITH YOUR NAUSEA MEDICATION *UNUSUAL SHORTNESS OF BREATH *UNUSUAL BRUISING OR BLEEDING *URINARY PROBLEMS (pain or burning when urinating, or frequent urination) *BOWEL PROBLEMS (unusual diarrhea, constipation, pain near the anus) TENDERNESS IN MOUTH AND THROAT WITH OR WITHOUT PRESENCE OF ULCERS (sore throat, sores in mouth, or a toothache) UNUSUAL RASH, SWELLING OR PAIN  UNUSUAL VAGINAL DISCHARGE OR ITCHING   Items with * indicate a potential emergency and should be followed up as soon as possible or go to the Emergency Department if any problems should occur.  Please show the CHEMOTHERAPY ALERT CARD or IMMUNOTHERAPY ALERT CARD at check-in to  the Emergency Department and triage nurse.  Should you have questions after your visit or need to cancel or reschedule your appointment, please contact [facility]  Dept: [phone]  and follow the prompts.  Office hours are 8:00 a.m. to 4:30 p.m. Monday - Friday. Please note that voicemails left after 4:00 p.m. may not be returned until the following business day.  We are closed weekends and major holidays. You have access to a nurse at all times for urgent questions. Please call the main number to the clinic Dept: [phone] and follow the prompts.   For any non-urgent questions, you may also contact your provider using MyChart. We now offer e-Visits for anyone 18 and older to request care online for non-urgent symptoms. For details visit [email or web address].   Also download the MyChart app! Go to the app store, search "MyChart", open the app, select [facility], and log in with your MyChart username and password.  Due to Covid, a mask is required upon entering the hospital/clinic. If you do not have a mask, one will be given to you upon arrival. For doctor visits, patients may have 1 support person aged 18 or older with them. For treatment visits, patients cannot have anyone with them due to current Covid guidelines and our immunocompromised population.   

## 2020-12-03 NOTE — Assessment & Plan Note (Signed)
CT imaging show no evidence of disease She is interested to continue on maintenance bevacizumab I do not recommend repeating CT imaging more than once a year unless she has new symptoms She will continue treatment every 3 weeks and I will see her every other time

## 2020-12-03 NOTE — Assessment & Plan Note (Signed)
Her thrombocytopenia is stable Observe only for now 

## 2020-12-03 NOTE — Assessment & Plan Note (Signed)
Her blood pressure is well controlled She will continue close monitoring

## 2020-12-03 NOTE — Progress Notes (Signed)
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   All questions were answered. The patient knows to call the clinic with any problems, questions or concerns. The total time spent in the appointment was 20 minutes encounter with patients including review of chart and various tests results, discussions about plan of care and coordination of care plan   [name], MD 12/03/2020 10:42 AM  INTERVAL HISTORY: Please see below for problem oriented charting. She returns to be seen for maintenance bevacizumab She is doing well She is able to lose some weight recently She denies abdominal pain or changes in bowel habits Her documented blood pressure at home is satisfactory  SUMMARY OF ONCOLOGIC HISTORY: Oncology History Overview Note  Oncologic Summary: 1. 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 Random CA 125 elevation January 2019  Question mesenteric nodules and anterior abdominal wall nodule 3. GeneDx Breast/Ovary Panel negative (including BRCA, MMR's, RAD51  etc) ? 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  Interval development of large mid abdominal mass highly concerning for right ovarian cancer.  There is mild omental nodularity on the left, and peritoneal disease cannot be completely excluded.  There is no ascites or other evidence of metastatic disease. 2.  Mild associated renal pelvocaliectasis bilaterally without obstruction.  Nonobstructing left renal calculus and a small right renal angiomyolipoma noted incidentally.   11/08/2012 Pathology Results   1. 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 - BENIGN FALLOPIAN TUBE WITH NONSPECIFIC CHRONIC INFLAMMATION. 2. Ovary and fallopian tube, left - BENIGN OVARY; NEGATIVE FOR ATYPIA OR MALIGNANCY. - BENIGN FALLOPIAN TUBE; NEGATIVE FOR ATYPIA OR MALIGNANCY. 3. Omentum, resection for tumor - HIGH GRADE CARCINOMA, SEE COMMENT. 4. Appendix, Other than Incidental - FIBROUS OBLITERATION OF APPENDICEAL TIP. - NEGATIVE FOR MALIGNANCY.   11/08/2012 Surgery   Surgery: Exploratory laparotomy, bilateral salpingo-oophorectomy, appendectomy, infacolic omentectomy, optimal debulking  Surgeons:  Paola A. Alycia Rossetti, MD; Lahoma Crocker, MD   Assistant: Caswell Corwin  Pathology: Bilateral fallopian tubes and ovaries to pathology. Appendix as well as omentum. 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  Operative findings: 25 cm right adnexal mass with smooth surface. Surgically absent uterus. Atrophic-appearing left ovary. Normal appearing appendix. Within the omentum there were centimeter nodules scattered throughout the omentum. The remainder of the surfaces were benign.   12/08/2012 Procedure    Impression:  Placement of  a subcutaneous port device.  The catheter tip is in the lower SVC and ready to be used.    12/13/2012 - 03/28/2013 Chemotherapy   s/p 6 cycles of paclitaxel and carboplatin   12/13/2012 - 03/28/2013 Chemotherapy   The patient had 6 cycles of carboplatin and Taxol   03/24/2013 Imaging   US abdomen   04/24/2013 Imaging   CT abdomen Interval resection of the large right pelvic and lower abdominal mass lesion with apparent omentectomy.  No evidence for intraperitoneal free fluid on today's study.  No discernible peritoneal lesions.  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   09/07/2013 Genetic Testing   Patient has genetic testing done for BRCA1/2 panel Results revealed patient has no mutation(s):   07/09/2015 Imaging   CT abdomen 1. 12 mm obstructive calculus at the left ureteropelvic junction with moderate proximal hydronephrosis. 2. 2 small supraumbilical ventral hernias, one containing a short segment of the mid transverse colon and the other containing a short segment of the mid small bowel. There is no associated evidence to suggest bowel incarceration or obstruction at this time. 3. Tiny locule of gas non dependently in the lumen of the urinary bladder. This is presumably iatrogenic related to recent catheterization for urinalysis. Alternatively, this could be seen in the setting of urinary tract infection with gas-forming organisms. Clinical correlation for history of recent catheterization is recommended. 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 5. Status post cholecystectomy. 6. Additional incidental findings, as above.    12/11/2016 Imaging   Ct abdomen 1. No evidence of metastatic ovarian cancer. 2. Recurrent subxiphoid ventral abdominal wall hernia containing transverse colon. No evidence of incarceration or obstruction. 3. Stable incidental findings in the liver and kidneys. No recurrent urinary tract calculus. 4.  Progressive lower lumbar spondylosis. 5.  Aortic Atherosclerosis (ICD10-I70.0).    08/30/2017 Imaging   MRI thoracic spine 1. At T5-6 there is a small central disc protrusion contacting the ventral thoracic spinal cord. No central canal or foraminal stenosis. 2. At T9-10 there is a small right paracentral disc protrusion. 3.  No acute osseous injury of the thoracic spine. 4. No aggressive osseous lesion to suggest metastatic disease.   09/24/2017 Tumor Marker   Patient's tumor was tested for the following markers: CA-125 Results of the tumor marker test revealed 21.7   09/30/2017 Imaging   CT abdomen 1. New small clustered soft tissue nodules in the left lower quadrant in the sigmoid mesentery, largest 1.0 cm, which could represent recurrent peritoneal tumor implants. No ascites.  2. Midline high ventral abdominal wall hernia containing a portion of the transverse colon is mildly increased in size, and without bowel complication at this time. 3. Chronic findings include: Aortic Atherosclerosis (ICD10-I70.0). Diffuse hepatic steatosis. 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   10/11/2017 PET scan   1. Nodules in the sigmoid mesentery are hypermetabolic and highly worrisome for metastatic disease. 2. Attic steatosis.    11/03/2017 Procedure   Successful CT-guided rectus abdominal muscle mass core biopsy. Path: - FOREIGN BODY GIANT CELL REACTION INVOLVING FIBROADIPOSE TISSUE AND SKELETAL MUSCLE. - NO EVIDENCE OF MALIGNANCY.   11/04/2017 Cancer Staging   Staging form: Fallopian Tube, AJCC 7th Edition - Clinical: FIGO Stage IIIC, calculated as Stage IV (rT3, N0, M1) - Signed by [name], MD on 08/09/2019   02/2018 Imaging   CT: 1. Continued increase in size small peritoneal nodules along the mesenteric border of the proximal  sigmoid colon as well as along the serosal surface of the proximal sigmoid colon. 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 2. No evidence of distant disease. 3. Stable large ventral hernia.   05/2018 Imaging   PET: 1. Redemonstration of hypermetabolic nodules within the sigmoid mesocolon. Mild response to therapy relative to CT of 02/24/2018. Mixed response to therapy compared to the most recent PET of 10/11/2017. 2. Hypermetabolism within the right pelvic rectus musculature, increased since the prior PET.  Clinical service requested comparison to the 11/24/2017 CT. Index 10 mm nodule within the sigmoid mesocolon was similar to the 02/24/2018 CT, and as described on that exam, increased from 7 mm on 11/24/2017. More inferior nodule within the mesocolon measures 12 mm today on image 156/4 and 8 mm on 11/24/2017.   05/2018 Imaging   CT: IMPRESSION: 1. Since 02/24/2018, decreased size of peritoneal nodules centered in the sigmoid mesocolon. 2. No evidence of new or progressive disease. 3. Hepatic steatosis. 4. Subcentimeter right renal angiomyolipoma, similar. 5.  Aortic Atherosclerosis (ICD10-I70.0). 6. Ventral abdominal wall laxity containing transverse colon, similar.   08/2018 Imaging   CT: IMPRESSION: 1. Nodules within the sigmoid mesocolon have decreased in size compared to prior. No new peritoneal or omental nodularity. 2. No evidence local recurrence the pelvis. 3. Ventral hernia contains a segment of transverse colon. No change from prior.     06/19/2019 Imaging   1. No evidence of metastatic disease in the abdomen pelvis. 2. No peritoneal or omental metastasis identified.  No free fluid. 3. Postcholecystectomy and hysterectomy. Comparison exams are made available. Comparison CT 09/08/2018 and 05/27/2018. PET-CT 05/27/2018    There is a nodule within the proximal aspect of the sigmoid colon measuring 2.2 by 2.2 cm. In comparison to prior CTs and PET-CT there was a hypermetabolic nodule at this location on the PET-CT of 08/27/2017 and on the CT of  09/08/2018 there was a small residual nodule. At that time (09/08/2018) the nodule measured 1.4 by 1.3 cm. Therefore this nodule has increased in the interval and concerning for recurrence of a serosal implant. The previous described lymph nodes in the sigmoid mesocolon and more central mesentery are not increased in size and not pathologic by size criteria.    Concern for recurrence of serosal metastasis in the proximal sigmoid colon with a 2 cm enlarging lesion. Lesion extends into the lumen. No evidence of high-grade obstruction. Consider FDG PET scan and/or potential colonoscopy for evaluation.   06/29/2019 PET scan   1. Enlarging serosal implant within the proximal sigmoid colon has intense metabolic activity most consistent with malignancy. Lesion exhibits a somewhat indolent progression as present on PET-CT scan from 10/11/2017 and 05/27/2018. 2. Focal hypermetabolic activity within the RIGHT rectus muscle just off midline is slightly decreased from comparison exam. This may be benign inflammation related to prior laparotomy, however malignancy not excluded.   07/26/2019 Procedure   She had colonoscopy which showed multiple polyps.  6 polyps were removed from the cecum, measuring 3 to 6 mm in size.  One 12 mm polyp was removed from ascending colon and another 1 measures 7 mm.  One 5 mm polyp was removed from the transverse colon.  There is tumor noted in the sigmoid colon, approximately 35 cm from the anus which was biopsied.  The tumor appeared to be fungating, infiltrative and ulcerated but nonobstructive.  It encompassed approximately one third of the circumference of the lumen.   07/26/2019 Pathology Results   Multiple  polyps came back tubular adenomas.  2 ascending polyps came back sessile serrated adenoma months.  Sigmoid colon biopsy confirmed metastatic high-grade serous carcinoma.  The morphology and immunophenotype are consistent with metastatic high-grade serous carcinoma from  tubal/ovarian primary site.   08/17/2019 - 12/12/2019 Chemotherapy   The patient had carboplatin and taxol   10/23/2019 Imaging   1. Sigmoid lesion with diminished size/conspicuity difficult to measure on the previous exam. Adjacent lymph nodes and nodularity less than a cm, largest on coronal image 48 measuring 5 mm. 2. Right rectus muscle with some thickening with mildly convex margin seen posteriorly on image 72 of series 2. This could be due to postsurgical change, however, metastatic disease is not excluded. Attention on follow-up is suggested. 3. No evidence for new metastatic disease. 4. Small hiatal hernia. 5. 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   Aortic Atherosclerosis (ICD10-I70.0).   01/01/2020 Imaging   1. No signs of new disease. 2. Tiny lymph nodes in the area of concern in the LEFT lower quadrant z. 3. Added density and subtle contour irregularity involving the rectus muscles best seen on sagittal images, associated with site of prior surgical incision just to the RIGHT of midline (image 72, series 2 and image 58, series 5. Not significantly changed compared to prior studies. This measures approximately 2.1 x 1 cm in the sagittal plane and is less well-defined in the axial plane. Potentially postoperative change. Attention on follow-up. 4. Aortic atherosclerosis.   Aortic Atherosclerosis (ICD10-I70.0).       01/29/2020 -  Chemotherapy    Patient is on Treatment Plan: OVARIAN BEVACIZUMAB      05/06/2020 Imaging   1. Status post hysterectomy and oophorectomy. 2. No specific findings of recurrent or metastatic disease in the abdomen or pelvis. 3. No change in postoperative appearance of low midline incision. 4. Unchanged small prominent subcentimeter left iliac lymph nodes.   10/22/2020 Imaging   1. Status post hysterectomy and oophorectomy. No evidence of recurrent or metastatic disease within the abdomen or pelvis. 2. No significant change in the subcentimeter  prominent left iliac lymph nodes.     REVIEW OF SYSTEMS:   Constitutional: Denies fevers, chills or abnormal weight loss Eyes: Denies blurriness of vision Ears, nose, mouth, throat, and face: Denies mucositis or sore throat Respiratory: Denies cough, dyspnea or wheezes Cardiovascular: Denies palpitation, chest discomfort or lower extremity swelling Gastrointestinal:  Denies nausea, heartburn or change in bowel habits Skin: Denies abnormal skin rashes Lymphatics: Denies new lymphadenopathy or easy bruising Neurological:Denies numbness, tingling or new weaknesses Behavioral/Psych: Mood is stable, no new changes  All other systems were reviewed with the patient and are negative.  I have reviewed the past medical history, past surgical history, social history and family history with the patient and they are unchanged from previous note.  ALLERGIES:  is allergic to [employer] tartrate] and cortisone.  MEDICATIONS:  Current Outpatient Medications  Medication Sig Dispense Refill  . amLODipine (NORVASC) 10 MG tablet Take 1 tablet (10 mg total) by mouth daily. 30 tablet 11  . Armodafinil 250 MG tablet Take 1 tablet (250 mg total) by mouth daily. 90 tablet 0  . bevacizumab in sodium chloride 0.9 % 100 mL Inject into the vein once.    . busPIRone (BUSPAR) 7.5 MG tablet Take 15 mg by mouth 2 (two) times daily.    . chlorthalidone (HYGROTON) 25 MG tablet Take 25 mg by mouth daily.    . cholecalciferol (VITAMIN D3) 25 MCG (1000  UT) tablet Take 1,000 Units by mouth daily.    . colestipol (COLESTID) 1 g tablet Take 0.5-1 g by mouth daily. Pt taking 1/2 to 1 tablet daily depending on meal choice; taken 1 hour after morning meds , and can not eat for another hour    . eszopiclone (LUNESTA) 2 MG TABS tablet Take immediately before bedtime 90 tablet 1  . FLUoxetine (PROZAC) 20 MG capsule Take 40 mg by mouth every morning. Takes with a 28m capsule for her total dose of 34m    .  lidocaine-prilocaine (EMLA) cream Apply 1 application topically as needed. 30 g 1  . MELATONIN PO Take 10 mg by mouth at bedtime.     . metFORMIN (GLUCOPHAGE-XR) 500 MG 24 hr tablet Take 500 mg by mouth daily with breakfast.   3  . metoprolol tartrate (LOPRESSOR) 25 MG tablet Take 1 tablet (25 mg total) by mouth 2 (two) times daily. 60 tablet 3  . Multiple Vitamin (MULTIVITAMIN WITH MINERALS) TABS Take 1 tablet by mouth every morning.     . rosuvastatin (CRESTOR) 20 MG tablet Take 20 mg by mouth every morning.     . Marland Kitchenelmisartan (MICARDIS) 40 MG tablet Take 40 mg by mouth daily.     No current facility-administered medications for this visit.   Facility-Administered Medications Ordered in Other Visits  Medication Dose Route Frequency Provider Last Rate Last Admin  . sodium chloride flush (NS) 0.9 % injection 10 mL  10 mL Intravenous PRN GeNancy MarusMD   10 mL at 05/12/17 0823  . sodium chloride flush (NS) 0.9 % injection 10 mL  10 mL Intracatheter PRN GoAlvy BimlerNi, MD   10 mL at 12/03/20 1041    PHYSICAL EXAMINATION: ECOG PERFORMANCE STATUS: 1 - Symptomatic but completely ambulatory  Vitals:   12/03/20 0841  BP: 134/72  Pulse: 68  Resp: 17  Temp: 97.9 F (36.6 C)  SpO2: 100%   Filed Weights   12/03/20 0841  Weight: 191 lb (86.6 kg)    GENERAL:alert, no distress and comfortable SKIN: skin color, texture, turgor are normal, no rashes or significant lesions EYES: normal, Conjunctiva are pink and non-injected, sclera clear OROPHARYNX:no exudate, no erythema and lips, buccal mucosa, and tongue normal  NECK: supple, thyroid normal size, non-tender, without nodularity LYMPH:  no palpable lymphadenopathy in the cervical, axillary or inguinal LUNGS: clear to auscultation and percussion with normal breathing effort HEART: regular rate & rhythm and no murmurs and no lower extremity edema ABDOMEN:abdomen soft, non-tender and normal bowel sounds Musculoskeletal:no cyanosis of digits and  no clubbing  NEURO: alert & oriented x 3 with fluent speech, no focal motor/sensory deficits  LABORATORY DATA:  I have reviewed the data as listed    Component Value Date/Time   NA 142 12/03/2020 0830   NA 141 12/02/2016 1219   K 4.2 12/03/2020 0830   K 3.7 12/02/2016 1219   CL 103 12/03/2020 0830   CL 103 01/18/2013 1329   CO2 26 12/03/2020 0830   CO2 27 12/02/2016 1219   GLUCOSE 100 (H) 12/03/2020 0830   GLUCOSE 98 12/02/2016 1219   GLUCOSE 110 (H) 01/18/2013 1329   BUN 15 12/03/2020 0830   BUN 15.3 12/02/2016 1219   CREATININE 0.76 12/03/2020 0830   CREATININE 0.73 07/30/2020 0716   CREATININE 0.7 12/02/2016 1219   CALCIUM 10.2 12/03/2020 0830   CALCIUM 10.1 12/02/2016 1219   PROT 7.1 12/03/2020 0830   PROT 7.1 12/02/2016 1219   ALBUMIN 4.1  12/03/2020 0830   ALBUMIN 4.2 12/02/2016 1219   AST 31 12/03/2020 0830   AST 29 07/30/2020 0716   AST 21 12/02/2016 1219   ALT 35 12/03/2020 0830   ALT 37 07/30/2020 0716   ALT 27 12/02/2016 1219   ALKPHOS 83 12/03/2020 0830   ALKPHOS 104 12/02/2016 1219   BILITOT 0.6 12/03/2020 0830   BILITOT 0.5 07/30/2020 0716   BILITOT 0.37 12/02/2016 1219   GFRNONAA >60 12/03/2020 0830   GFRNONAA >60 07/30/2020 0716   GFRAA >60 05/07/2020 0833    No results found for: SPEP, UPEP  Lab Results  Component Value Date   WBC 5.2 12/03/2020   NEUTROABS 2.8 12/03/2020   HGB 13.9 12/03/2020   HCT 41.7 12/03/2020   MCV 91.6 12/03/2020   PLT 125 (L) 12/03/2020      Chemistry      Component Value Date/Time   NA 142 12/03/2020 0830   NA 141 12/02/2016 1219   K 4.2 12/03/2020 0830   K 3.7 12/02/2016 1219   CL 103 12/03/2020 0830   CL 103 01/18/2013 1329   CO2 26 12/03/2020 0830   CO2 27 12/02/2016 1219   BUN 15 12/03/2020 0830   BUN 15.3 12/02/2016 1219   CREATININE 0.76 12/03/2020 0830   CREATININE 0.73 07/30/2020 0716   CREATININE 0.7 12/02/2016 1219   GLU 236 12/13/2012 1558      Component Value Date/Time   CALCIUM 10.2  12/03/2020 0830   CALCIUM 10.1 12/02/2016 1219   ALKPHOS 83 12/03/2020 0830   ALKPHOS 104 12/02/2016 1219   AST 31 12/03/2020 0830   AST 29 07/30/2020 0716   AST 21 12/02/2016 1219   ALT 35 12/03/2020 0830   ALT 37 07/30/2020 0716   ALT 27 12/02/2016 1219   BILITOT 0.6 12/03/2020 0830   BILITOT 0.5 07/30/2020 0716   BILITOT 0.37 12/02/2016 1219

## 2020-12-03 NOTE — Patient Instructions (Signed)
Implanted Port Insertion, Care After This sheet gives you information about how to care for yourself after your procedure. Your health care provider may also give you more specific instructions. If you have problems or questions, contact your health care provider. What can I expect after the procedure? After the procedure, it is common to have:  Discomfort at the port insertion site.  Bruising on the skin over the port. This should improve over 3-4 days. Follow these instructions at home: Port care  After your port is placed, you will get a manufacturer's information card. The card has information about your port. Keep this card with you at all times.  Take care of the port as told by your health care provider. Ask your health care provider if you or a family member can get training for taking care of the port at home. A home health care nurse may also take care of the port.  Make sure to remember what type of port you have. Incision care  Follow instructions from your health care provider about how to take care of your port insertion site. Make sure you: ? Wash your hands with soap and water before and after you change your bandage (dressing). If soap and water are not available, use hand sanitizer. ? Change your dressing as told by your health care provider. ? Leave stitches (sutures), skin glue, or adhesive strips in place. These skin closures may need to stay in place for 2 weeks or longer. If adhesive strip edges start to loosen and curl up, you may trim the loose edges. Do not remove adhesive strips completely unless your health care provider tells you to do that.  Check your port insertion site every day for signs of infection. Check for: ? Redness, swelling, or pain. ? Fluid or blood. ? Warmth. ? Pus or a bad smell.      Activity  Return to your normal activities as told by your health care provider. Ask your health care provider what activities are safe for you.  Do not  lift anything that is heavier than 10 lb (4.5 kg), or the limit that you are told, until your health care provider says that it is safe. General instructions  Take over-the-counter and prescription medicines only as told by your health care provider.  Do not take baths, swim, or use a hot tub until your health care provider approves. Ask your health care provider if you may take showers. You may only be allowed to take sponge baths.  Do not drive for 24 hours if you were given a sedative during your procedure.  Wear a medical alert bracelet in case of an emergency. This will tell any health care providers that you have a port.  Keep all follow-up visits as told by your health care provider. This is important. Contact a health care provider if:  You cannot flush your port with saline as directed, or you cannot draw blood from the port.  You have a fever or chills.  You have redness, swelling, or pain around your port insertion site.  You have fluid or blood coming from your port insertion site.  Your port insertion site feels warm to the touch.  You have pus or a bad smell coming from the port insertion site. Get help right away if:  You have chest pain or shortness of breath.  You have bleeding from your port that you cannot control. Summary  Take care of the port as told by your   health care provider. Keep the manufacturer's information card with you at all times.  Change your dressing as told by your health care provider.  Contact a health care provider if you have a fever or chills or if you have redness, swelling, or pain around your port insertion site.  Keep all follow-up visits as told by your health care provider. This information is not intended to replace advice given to you by your health care provider. Make sure you discuss any questions you have with your health care provider. Document Revised: 02/22/2018 Document Reviewed: 02/22/2018 Elsevier Patient Education   2021 [employer]  

## 2020-12-11 ENCOUNTER — Other Ambulatory Visit: Payer: Self-pay | Admitting: Neurology

## 2020-12-13 DIAGNOSIS — E1159 Type 2 diabetes mellitus with other circulatory complications: Secondary | ICD-10-CM | POA: Diagnosis not present

## 2020-12-13 DIAGNOSIS — I10 Essential (primary) hypertension: Secondary | ICD-10-CM | POA: Diagnosis not present

## 2020-12-13 DIAGNOSIS — M159 Polyosteoarthritis, unspecified: Secondary | ICD-10-CM | POA: Diagnosis not present

## 2020-12-13 DIAGNOSIS — E782 Mixed hyperlipidemia: Secondary | ICD-10-CM | POA: Diagnosis not present

## 2020-12-17 DIAGNOSIS — G4733 Obstructive sleep apnea (adult) (pediatric): Secondary | ICD-10-CM | POA: Diagnosis not present

## 2020-12-18 ENCOUNTER — Other Ambulatory Visit: Payer: Self-pay | Admitting: Neurology

## 2020-12-18 ENCOUNTER — Other Ambulatory Visit: Payer: Self-pay | Admitting: Family Medicine

## 2020-12-18 MED ORDER — ARMODAFINIL 250 MG PO TABS: 250.0000 mg | ORAL_TABLET | ORAL | 1 refills | Status: DC

## 2020-12-18 MED ORDER — ARMODAFINIL 250 MG PO TABS: 250.0000 mg | ORAL_TABLET | Freq: Every morning | ORAL | 5 refills | Status: DC

## 2020-12-18 NOTE — Addendum Note (Signed)
Addended by: Darleen Crocker on: 12/18/2020 02:59 PM   Modules accepted: Orders

## 2020-12-18 NOTE — Addendum Note (Signed)
Addended by: Debbora Presto L on: 12/18/2020 03:51 PM   Modules accepted: Orders

## 2020-12-18 NOTE — Telephone Encounter (Signed)
Attempted to escribe the script and would not send to pharmacy. Re entering a new script to see if this will go through

## 2020-12-25 ENCOUNTER — Other Ambulatory Visit: Payer: Medicare HMO

## 2020-12-25 ENCOUNTER — Other Ambulatory Visit: Payer: Self-pay

## 2020-12-25 ENCOUNTER — Inpatient Hospital Stay: Payer: Medicare HMO

## 2020-12-25 ENCOUNTER — Inpatient Hospital Stay: Payer: Medicare HMO | Attending: Gynecologic Oncology

## 2020-12-25 VITALS — BP 125/65 | HR 64 | Temp 98.8°F | Wt 192.0 lb

## 2020-12-25 DIAGNOSIS — Z79899 Other long term (current) drug therapy: Secondary | ICD-10-CM | POA: Insufficient documentation

## 2020-12-25 DIAGNOSIS — C5700 Malignant neoplasm of unspecified fallopian tube: Secondary | ICD-10-CM

## 2020-12-25 DIAGNOSIS — I10 Essential (primary) hypertension: Secondary | ICD-10-CM | POA: Insufficient documentation

## 2020-12-25 DIAGNOSIS — Z5112 Encounter for antineoplastic immunotherapy: Secondary | ICD-10-CM | POA: Diagnosis not present

## 2020-12-25 DIAGNOSIS — D696 Thrombocytopenia, unspecified: Secondary | ICD-10-CM | POA: Insufficient documentation

## 2020-12-25 DIAGNOSIS — Z7189 Other specified counseling: Secondary | ICD-10-CM

## 2020-12-25 DIAGNOSIS — C5701 Malignant neoplasm of right fallopian tube: Secondary | ICD-10-CM

## 2020-12-25 LAB — CBC WITH DIFFERENTIAL/PLATELET
Abs Immature Granulocytes: 0.01 10*3/uL (ref 0.00–0.07)
Basophils Absolute: 0 10*3/uL (ref 0.0–0.1)
Basophils Relative: 0 %
Eosinophils Absolute: 0.1 10*3/uL (ref 0.0–0.5)
Eosinophils Relative: 2 %
HCT: 39.9 % (ref 36.0–46.0)
Hemoglobin: 13.4 g/dL (ref 12.0–15.0)
Immature Granulocytes: 0 %
Lymphocytes Relative: 32 %
Lymphs Abs: 1.6 10*3/uL (ref 0.7–4.0)
MCH: 30.6 pg (ref 26.0–34.0)
MCHC: 33.6 g/dL (ref 30.0–36.0)
MCV: 91.1 fL (ref 80.0–100.0)
Monocytes Absolute: 0.5 10*3/uL (ref 0.1–1.0)
Monocytes Relative: 10 %
Neutro Abs: 2.7 10*3/uL (ref 1.7–7.7)
Neutrophils Relative %: 56 %
Platelets: 126 10*3/uL — ABNORMAL LOW (ref 150–400)
RBC: 4.38 MIL/uL (ref 3.87–5.11)
RDW: 13.2 % (ref 11.5–15.5)
WBC: 4.9 10*3/uL (ref 4.0–10.5)
nRBC: 0 % (ref 0.0–0.2)

## 2020-12-25 LAB — COMPREHENSIVE METABOLIC PANEL
ALT: 37 U/L (ref 0–44)
AST: 34 U/L (ref 15–41)
Albumin: 3.9 g/dL (ref 3.5–5.0)
Alkaline Phosphatase: 76 U/L (ref 38–126)
Anion gap: 11 (ref 5–15)
BUN: 13 mg/dL (ref 8–23)
CO2: 28 mmol/L (ref 22–32)
Calcium: 9.6 mg/dL (ref 8.9–10.3)
Chloride: 102 mmol/L (ref 98–111)
Creatinine, Ser: 0.71 mg/dL (ref 0.44–1.00)
GFR, Estimated: >60 mL/min (ref >60)
Glucose, Bld: 105 mg/dL — ABNORMAL HIGH (ref 70–99)
Potassium: 4.1 mmol/L (ref 3.5–5.1)
Sodium: 141 mmol/L (ref 135–145)
Total Bilirubin: 0.5 mg/dL (ref 0.3–1.2)
Total Protein: 6.9 g/dL (ref 6.5–8.1)

## 2020-12-25 LAB — TOTAL PROTEIN, URINE DIPSTICK: Protein, ur: NEGATIVE mg/dL

## 2020-12-25 MED ORDER — SODIUM CHLORIDE 0.9 % IV SOLN
Freq: Once | INTRAVENOUS | Status: AC
  Filled 2020-12-25: qty 250

## 2020-12-25 MED ORDER — SODIUM CHLORIDE 0.9% FLUSH
10.0000 mL | Freq: Once | INTRAVENOUS | Status: AC
  Administered 2020-12-25: 10 mL
  Filled 2020-12-25: qty 10

## 2020-12-25 MED ORDER — SODIUM CHLORIDE 0.9% FLUSH
10.0000 mL | INTRAVENOUS | Status: DC | PRN
  Administered 2020-12-25: 10 mL
  Filled 2020-12-25: qty 10

## 2020-12-25 MED ORDER — SODIUM CHLORIDE 0.9 % IV SOLN
15.0000 mg/kg | Freq: Once | INTRAVENOUS | Status: AC
  Administered 2020-12-25: 1300 mg via INTRAVENOUS
  Filled 2020-12-25: qty 48

## 2020-12-25 MED ORDER — HEPARIN SOD (PORK) LOCK FLUSH 100 UNIT/ML IV SOLN
500.0000 [IU] | Freq: Once | INTRAVENOUS | Status: AC | PRN
Start: 2020-12-25 — End: 2020-12-25
  Administered 2020-12-25: 500 [IU]
  Filled 2020-12-25: qty 5

## 2020-12-25 NOTE — Patient Instructions (Signed)
[facility]  Discharge Instructions: Thank you for choosing [facility] to provide your oncology and hematology care.   If you have a lab appointment with the Cancer Center, please go directly to the Cancer Center and check in at the registration area.   Wear comfortable clothing and clothing appropriate for easy access to any Portacath or PICC line.   We strive to give you quality time with your provider. You may need to reschedule your appointment if you arrive late (15 or more minutes).  Arriving late affects you and other patients whose appointments are after yours.  Also, if you miss three or more appointments without notifying the office, you may be dismissed from the clinic at the provider's discretion.      For prescription refill requests, have your pharmacy contact our office and allow 72 hours for refills to be completed.    Today you received the following chemotherapy and/or immunotherapy agents Avastin       To help prevent nausea and vomiting after your treatment, we encourage you to take your nausea medication as directed.  BELOW ARE SYMPTOMS THAT SHOULD BE REPORTED IMMEDIATELY: *FEVER GREATER THAN 100.4 F (38 C) OR HIGHER *CHILLS OR SWEATING *NAUSEA AND VOMITING THAT IS NOT CONTROLLED WITH YOUR NAUSEA MEDICATION *UNUSUAL SHORTNESS OF BREATH *UNUSUAL BRUISING OR BLEEDING *URINARY PROBLEMS (pain or burning when urinating, or frequent urination) *BOWEL PROBLEMS (unusual diarrhea, constipation, pain near the anus) TENDERNESS IN MOUTH AND THROAT WITH OR WITHOUT PRESENCE OF ULCERS (sore throat, sores in mouth, or a toothache) UNUSUAL RASH, SWELLING OR PAIN  UNUSUAL VAGINAL DISCHARGE OR ITCHING   Items with * indicate a potential emergency and should be followed up as soon as possible or go to the Emergency Department if any problems should occur.  Please show the CHEMOTHERAPY ALERT CARD or IMMUNOTHERAPY ALERT CARD at check-in to  the Emergency Department and triage nurse.  Should you have questions after your visit or need to cancel or reschedule your appointment, please contact [facility]  Dept: [phone]  and follow the prompts.  Office hours are 8:00 a.m. to 4:30 p.m. Monday - Friday. Please note that voicemails left after 4:00 p.m. may not be returned until the following business day.  We are closed weekends and major holidays. You have access to a nurse at all times for urgent questions. Please call the main number to the clinic Dept: [phone] and follow the prompts.   For any non-urgent questions, you may also contact your provider using MyChart. We now offer e-Visits for anyone 18 and older to request care online for non-urgent symptoms. For details visit [email or web address].   Also download the MyChart app! Go to the app store, search "MyChart", open the app, select [place], and log in with your MyChart username and password.  Due to Covid, a mask is required upon entering the hospital/clinic. If you do not have a mask, one will be given to you upon arrival. For doctor visits, patients may have 1 support person aged 18 or older with them. For treatment visits, patients cannot have anyone with them due to current Covid guidelines and our immunocompromised population.   

## 2020-12-27 ENCOUNTER — Telehealth: Payer: Self-pay | Admitting: Hematology and Oncology

## 2020-12-27 NOTE — Telephone Encounter (Signed)
R/Sch per 3/16 los, pt aware

## 2021-01-07 ENCOUNTER — Encounter: Payer: Self-pay | Admitting: General Practice

## 2021-01-07 NOTE — Progress Notes (Signed)
[facility] Spiritual Care Note  Received call from [place] friend Sharon Seller (whom I know as well from our extended [employer]). Per Marshall Cork wished to let me know of a family crisis so that I can provide prayer support and, as needs become clear, emotional and possibly logistical support.  Because [facility] is not ready for phone calls for the next few days, I will mail a handwritten note of support and encouragement.   [place], [place], [facility] Pager [phone] Voicemail [phone]

## 2021-01-14 DIAGNOSIS — M159 Polyosteoarthritis, unspecified: Secondary | ICD-10-CM | POA: Diagnosis not present

## 2021-01-14 DIAGNOSIS — I10 Essential (primary) hypertension: Secondary | ICD-10-CM | POA: Diagnosis not present

## 2021-01-14 DIAGNOSIS — E1159 Type 2 diabetes mellitus with other circulatory complications: Secondary | ICD-10-CM | POA: Diagnosis not present

## 2021-01-14 DIAGNOSIS — E782 Mixed hyperlipidemia: Secondary | ICD-10-CM | POA: Diagnosis not present

## 2021-01-15 ENCOUNTER — Ambulatory Visit: Payer: Medicare HMO

## 2021-01-15 ENCOUNTER — Other Ambulatory Visit: Payer: Medicare HMO

## 2021-01-15 ENCOUNTER — Ambulatory Visit: Payer: Medicare HMO | Admitting: Hematology and Oncology

## 2021-01-16 ENCOUNTER — Inpatient Hospital Stay (HOSPITAL_BASED_OUTPATIENT_CLINIC_OR_DEPARTMENT_OTHER): Payer: Medicare HMO | Admitting: Hematology and Oncology

## 2021-01-16 ENCOUNTER — Inpatient Hospital Stay: Payer: Medicare HMO | Attending: Gynecologic Oncology

## 2021-01-16 ENCOUNTER — Other Ambulatory Visit: Payer: Self-pay

## 2021-01-16 ENCOUNTER — Encounter: Payer: Self-pay | Admitting: General Practice

## 2021-01-16 ENCOUNTER — Encounter: Payer: Self-pay | Admitting: Hematology and Oncology

## 2021-01-16 ENCOUNTER — Inpatient Hospital Stay: Payer: Medicare HMO

## 2021-01-16 DIAGNOSIS — Z5112 Encounter for antineoplastic immunotherapy: Secondary | ICD-10-CM | POA: Diagnosis not present

## 2021-01-16 DIAGNOSIS — D696 Thrombocytopenia, unspecified: Secondary | ICD-10-CM | POA: Diagnosis not present

## 2021-01-16 DIAGNOSIS — Z7984 Long term (current) use of oral hypoglycemic drugs: Secondary | ICD-10-CM | POA: Insufficient documentation

## 2021-01-16 DIAGNOSIS — Z90710 Acquired absence of both cervix and uterus: Secondary | ICD-10-CM | POA: Diagnosis not present

## 2021-01-16 DIAGNOSIS — R600 Localized edema: Secondary | ICD-10-CM

## 2021-01-16 DIAGNOSIS — Z7189 Other specified counseling: Secondary | ICD-10-CM

## 2021-01-16 DIAGNOSIS — C5701 Malignant neoplasm of right fallopian tube: Secondary | ICD-10-CM

## 2021-01-16 DIAGNOSIS — C5700 Malignant neoplasm of unspecified fallopian tube: Secondary | ICD-10-CM

## 2021-01-16 DIAGNOSIS — Z79899 Other long term (current) drug therapy: Secondary | ICD-10-CM | POA: Insufficient documentation

## 2021-01-16 LAB — CBC WITH DIFFERENTIAL/PLATELET
Abs Immature Granulocytes: 0.01 10*3/uL (ref 0.00–0.07)
Basophils Absolute: 0 10*3/uL (ref 0.0–0.1)
Basophils Relative: 1 %
Eosinophils Absolute: 0.1 10*3/uL (ref 0.0–0.5)
Eosinophils Relative: 2 %
HCT: 43.3 % (ref 36.0–46.0)
Hemoglobin: 14.2 g/dL (ref 12.0–15.0)
Immature Granulocytes: 0 %
Lymphocytes Relative: 32 %
Lymphs Abs: 1.7 10*3/uL (ref 0.7–4.0)
MCH: 30.5 pg (ref 26.0–34.0)
MCHC: 32.8 g/dL (ref 30.0–36.0)
MCV: 92.9 fL (ref 80.0–100.0)
Monocytes Absolute: 0.5 10*3/uL (ref 0.1–1.0)
Monocytes Relative: 8 %
Neutro Abs: 3.2 10*3/uL (ref 1.7–7.7)
Neutrophils Relative %: 57 %
Platelets: 133 10*3/uL — ABNORMAL LOW (ref 150–400)
RBC: 4.66 MIL/uL (ref 3.87–5.11)
RDW: 13.3 % (ref 11.5–15.5)
WBC: 5.5 10*3/uL (ref 4.0–10.5)
nRBC: 0 % (ref 0.0–0.2)

## 2021-01-16 LAB — COMPREHENSIVE METABOLIC PANEL
ALT: 31 U/L (ref 0–44)
AST: 29 U/L (ref 15–41)
Albumin: 4.1 g/dL (ref 3.5–5.0)
Alkaline Phosphatase: 80 U/L (ref 38–126)
Anion gap: 11 (ref 5–15)
BUN: 21 mg/dL (ref 8–23)
CO2: 27 mmol/L (ref 22–32)
Calcium: 9.8 mg/dL (ref 8.9–10.3)
Chloride: 104 mmol/L (ref 98–111)
Creatinine, Ser: 0.76 mg/dL (ref 0.44–1.00)
GFR, Estimated: >60 mL/min (ref >60)
Glucose, Bld: 115 mg/dL — ABNORMAL HIGH (ref 70–99)
Potassium: 3.8 mmol/L (ref 3.5–5.1)
Sodium: 142 mmol/L (ref 135–145)
Total Bilirubin: 0.6 mg/dL (ref 0.3–1.2)
Total Protein: 7.2 g/dL (ref 6.5–8.1)

## 2021-01-16 LAB — TOTAL PROTEIN, URINE DIPSTICK: Protein, ur: NEGATIVE mg/dL

## 2021-01-16 MED ORDER — SODIUM CHLORIDE 0.9 % IV SOLN
15.0000 mg/kg | Freq: Once | INTRAVENOUS | Status: AC
  Administered 2021-01-16: 1300 mg via INTRAVENOUS
  Filled 2021-01-16: qty 48

## 2021-01-16 MED ORDER — SODIUM CHLORIDE 0.9% FLUSH
10.0000 mL | Freq: Once | INTRAVENOUS | Status: AC
  Administered 2021-01-16: 10 mL
  Filled 2021-01-16: qty 10

## 2021-01-16 MED ORDER — SODIUM CHLORIDE 0.9 % IV SOLN
Freq: Once | INTRAVENOUS | Status: AC
  Filled 2021-01-16: qty 250

## 2021-01-16 MED ORDER — HEPARIN SOD (PORK) LOCK FLUSH 100 UNIT/ML IV SOLN
500.0000 [IU] | Freq: Once | INTRAVENOUS | Status: AC | PRN
  Administered 2021-01-16: 500 [IU]
  Filled 2021-01-16: qty 5

## 2021-01-16 MED ORDER — SODIUM CHLORIDE 0.9% FLUSH
10.0000 mL | INTRAVENOUS | Status: DC | PRN
Start: 2021-01-16 — End: 2021-01-16
  Administered 2021-01-16: 10 mL
  Filled 2021-01-16: qty 10

## 2021-01-16 NOTE — Assessment & Plan Note (Signed)
She has gained 2 pounds of weight I suspect her ankle swelling could be due to fluid retention We discussed importance of lifestyle changes and low sodium intake Observe closely for now

## 2021-01-16 NOTE — Progress Notes (Signed)
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 imaging show no evidence of disease She is interested to continue on maintenance bevacizumab I do not recommend repeating CT imaging more than once a year unless she has new symptoms She will continue treatment every 3 weeks and I will see her every other time  Thrombocytopenia ([place]) Her thrombocytopenia is stable Observe only for now  Bilateral edema of lower extremity She has gained 2 pounds of weight I suspect her ankle swelling could be due to fluid retention We discussed importance of lifestyle changes and low sodium intake Observe closely for now  No orders of the defined types were placed in this encounter.   All questions were answered. The patient knows to call the clinic with any problems, questions or concerns. The total time spent in the appointment was 20 minutes encounter with patients including review of chart and various tests results, discussions about plan of care and coordination of care plan   [name], MD 01/16/2021 12:35 PM  INTERVAL HISTORY: Please see below for problem oriented charting. 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  Interval development of large mid abdominal mass highly concerning for right ovarian cancer.  There is mild omental nodularity on the left, and peritoneal disease cannot be completely excluded.  There is no ascites or other evidence of metastatic disease. 2.  Mild associated renal pelvocaliectasis bilaterally without obstruction.  Nonobstructing left renal calculus and a small right renal angiomyolipoma noted incidentally.    11/08/2012 Pathology Results   1. 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 - BENIGN FALLOPIAN TUBE WITH NONSPECIFIC CHRONIC INFLAMMATION. 2. Ovary and fallopian tube, left - BENIGN OVARY; NEGATIVE FOR ATYPIA OR MALIGNANCY. - BENIGN FALLOPIAN TUBE; NEGATIVE FOR ATYPIA OR MALIGNANCY. 3. Omentum, resection for tumor - HIGH GRADE CARCINOMA, SEE COMMENT. 4. Appendix, Other than Incidental - FIBROUS OBLITERATION OF APPENDICEAL TIP. - NEGATIVE FOR MALIGNANCY.    11/08/2012 Surgery   Surgery: Exploratory laparotomy, bilateral salpingo-oophorectomy, appendectomy, infacolic omentectomy, optimal debulking   Surgeons:  Paola A. Alycia Rossetti, MD; Lahoma Crocker, MD    Assistant: Caswell Corwin  Pathology: Bilateral fallopian tubes and ovaries to pathology. Appendix as well as omentum. 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  Operative findings: 25 cm right  adnexal mass with smooth surface. Surgically absent uterus. Atrophic-appearing left ovary. Normal appearing appendix. Within the omentum there were centimeter nodules scattered throughout the omentum. The remainder of the surfaces were benign.    12/08/2012 Procedure   Impression:  Placement of a subcutaneous port device.  The catheter tip is in the lower SVC and ready to be used.      12/13/2012 - 03/28/2013 Chemotherapy   s/p 6 cycles of paclitaxel and carboplatin    12/13/2012 - 03/28/2013 Chemotherapy   The patient had 6 cycles of carboplatin and Taxol    03/24/2013 Imaging   US abdomen    04/24/2013 Imaging   CT abdomen Interval resection of the large right pelvic and lower abdominal mass lesion with apparent omentectomy.  No evidence for intraperitoneal free fluid on today's study.  No discernible peritoneal lesions.   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    09/07/2013 Genetic Testing   Patient has genetic testing done for BRCA1/2 panel Results revealed patient has no mutation(s):    07/09/2015 Imaging   CT abdomen 1. 12 mm obstructive calculus at the left ureteropelvic junction with moderate proximal hydronephrosis. 2. 2 small supraumbilical ventral hernias, one containing a short segment of the mid transverse colon and the other containing a short segment of the mid small bowel. There is no associated evidence to suggest bowel incarceration or obstruction at this time. 3. Tiny locule of gas non dependently in the lumen of the urinary bladder. This is presumably iatrogenic related to recent catheterization for urinalysis. Alternatively, this could be seen in the setting of urinary tract infection with gas-forming organisms. Clinical correlation for history of recent catheterization is recommended. 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 5. Status post cholecystectomy. 6. Additional incidental findings, as above.      12/11/2016 Imaging   Ct  abdomen 1. No evidence of metastatic ovarian cancer. 2. Recurrent subxiphoid ventral abdominal wall hernia containing transverse colon. No evidence of incarceration or obstruction. 3. Stable incidental findings in the liver and kidneys. No recurrent urinary tract calculus. 4. Progressive lower lumbar spondylosis. 5.  Aortic Atherosclerosis (ICD10-I70.0).      08/30/2017 Imaging   MRI thoracic spine 1. At T5-6 there is a small central disc protrusion contacting the ventral thoracic spinal cord. No central canal or foraminal stenosis. 2. At T9-10 there is a small right paracentral disc protrusion. 3.  No acute osseous injury of the thoracic spine. 4. No aggressive osseous lesion to suggest metastatic disease.    09/24/2017 Tumor Marker   Patient's tumor was tested for the following markers: CA-125 Results of the tumor marker test revealed 21.7    09/30/2017 Imaging   CT abdomen 1. New small clustered soft tissue nodules in the left lower quadrant in the sigmoid mesentery, largest 1.0 cm, which could represent recurrent peritoneal tumor implants. No ascites.  2. Midline high ventral abdominal wall hernia containing a portion of the transverse colon is mildly increased in size, and without bowel complication at this time. 3. Chronic findings include: Aortic Atherosclerosis (ICD10-I70.0). Diffuse hepatic steatosis. 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    10/11/2017 PET scan   1. Nodules in the sigmoid mesentery are hypermetabolic and highly worrisome for metastatic disease. 2. Attic steatosis.      11/03/2017 Procedure   Successful CT-guided rectus abdominal muscle mass core biopsy. Path: - FOREIGN BODY GIANT CELL REACTION INVOLVING  FIBROADIPOSE TISSUE AND SKELETAL MUSCLE. - NO EVIDENCE OF MALIGNANCY.   11/04/2017 Cancer Staging   Staging form: Fallopian Tube, AJCC 7th Edition - Clinical: FIGO Stage IIIC, calculated as Stage IV  (rT3, N0, M1) - Signed by [name], MD on 08/09/2019    02/2018 Imaging   CT: 1. Continued increase in size small peritoneal nodules along the mesenteric border of the proximal sigmoid colon as well as along the serosal surface of the proximal sigmoid colon. 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 2. No evidence of distant disease. 3. Stable large ventral hernia.   05/2018 Imaging   PET: 1. Redemonstration of hypermetabolic nodules within the sigmoid mesocolon. Mild response to therapy relative to CT of 02/24/2018. Mixed response to therapy compared to the most recent PET of 10/11/2017. 2. Hypermetabolism within the right pelvic rectus musculature, increased since the prior PET.  Clinical service requested comparison to the 11/24/2017 CT. Index 10 mm nodule within the sigmoid mesocolon was similar to the 02/24/2018 CT, and as described on that exam, increased from 7 mm on 11/24/2017. More inferior nodule within the mesocolon measures 12 mm today on image 156/4 and 8 mm on 11/24/2017.   05/2018 Imaging   CT: IMPRESSION: 1. Since 02/24/2018, decreased size of peritoneal nodules centered in the sigmoid mesocolon. 2. No evidence of new or progressive disease. 3. Hepatic steatosis. 4. Subcentimeter right renal angiomyolipoma, similar. 5.  Aortic Atherosclerosis (ICD10-I70.0). 6. Ventral abdominal wall laxity containing transverse colon, similar.   08/2018 Imaging   CT: IMPRESSION: 1. Nodules within the sigmoid mesocolon have decreased in size compared to prior. No new peritoneal or omental nodularity. 2. No evidence local recurrence the pelvis. 3. Ventral hernia contains a segment of transverse colon. No change from prior.     06/19/2019 Imaging   1. No evidence of metastatic disease in the abdomen pelvis. 2. No peritoneal or omental metastasis identified.  No free fluid. 3. Postcholecystectomy and hysterectomy. Comparison exams are made available.  Comparison CT 09/08/2018 and 05/27/2018. PET-CT 05/27/2018    There is a nodule within the proximal aspect of the sigmoid colon measuring 2.2 by 2.2 cm. In comparison to prior CTs and PET-CT there was a hypermetabolic nodule at this location on the PET-CT of 08/27/2017 and on the CT of 09/08/2018 there was a small residual nodule. At that time (09/08/2018) the nodule measured 1.4 by 1.3 cm. Therefore this nodule has increased in the interval and concerning for recurrence of a serosal implant. The previous described lymph nodes in the sigmoid mesocolon and more central mesentery are not increased in size and not pathologic by size criteria.    Concern for recurrence of serosal metastasis in the proximal sigmoid colon with a 2 cm enlarging lesion. Lesion extends into the lumen. No evidence of high-grade obstruction. Consider FDG PET scan and/or potential colonoscopy for evaluation.   06/29/2019 PET scan   1. Enlarging serosal implant within the proximal sigmoid colon has intense metabolic activity most consistent with malignancy. Lesion exhibits a somewhat indolent progression as present on PET-CT scan from 10/11/2017 and 05/27/2018. 2. Focal hypermetabolic activity within the RIGHT rectus muscle just off midline is slightly decreased from comparison exam. This may be benign inflammation related to prior laparotomy, however malignancy not excluded.   07/26/2019 Procedure   She had colonoscopy which showed multiple polyps.  6 polyps were removed from the cecum, measuring 3 to 6 mm in size.  One 12 mm polyp was removed from ascending colon  and another 1 measures 7 mm.  One 5 mm polyp was removed from the transverse colon.  There is tumor noted in the sigmoid colon, approximately 35 cm from the anus which was biopsied.  The tumor appeared to be fungating, infiltrative and ulcerated but nonobstructive.  It encompassed approximately one third of the circumference of the lumen.   07/26/2019 Pathology Results    Multiple polyps came back tubular adenomas.  2 ascending polyps came back sessile serrated adenoma months.  Sigmoid colon biopsy confirmed metastatic high-grade serous carcinoma.  The morphology and immunophenotype are consistent with metastatic high-grade serous carcinoma from tubal/ovarian primary site.   08/17/2019 - 12/12/2019 Chemotherapy   The patient had carboplatin and taxol   10/23/2019 Imaging   1. Sigmoid lesion with diminished size/conspicuity difficult to measure on the previous exam. Adjacent lymph nodes and nodularity less than a cm, largest on coronal image 48 measuring 5 mm. 2. Right rectus muscle with some thickening with mildly convex margin seen posteriorly on image 72 of series 2. This could be due to postsurgical change, however, metastatic disease is not excluded. Attention on follow-up is suggested. 3. No evidence for new metastatic disease. 4. Small hiatal hernia. 5. 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   Aortic Atherosclerosis (ICD10-I70.0).   01/01/2020 Imaging   1. No signs of new disease. 2. Tiny lymph nodes in the area of concern in the LEFT lower quadrant z. 3. Added density and subtle contour irregularity involving the rectus muscles best seen on sagittal images, associated with site of prior surgical incision just to the RIGHT of midline (image 72, series 2 and image 58, series 5. Not significantly changed compared to prior studies. This measures approximately 2.1 x 1 cm in the sagittal plane and is less well-defined in the axial plane. Potentially postoperative change. Attention on follow-up. 4. Aortic atherosclerosis.   Aortic Atherosclerosis (ICD10-I70.0).       01/29/2020 -  Chemotherapy    Patient is on Treatment Plan: OVARIAN BEVACIZUMAB       05/06/2020 Imaging   1. Status post hysterectomy and oophorectomy. 2. No specific findings of recurrent or metastatic disease in the abdomen or pelvis. 3. No change in postoperative appearance of low  midline incision. 4. Unchanged small prominent subcentimeter left iliac lymph nodes.   10/22/2020 Imaging   1. Status post hysterectomy and oophorectomy. No evidence of recurrent or metastatic disease within the abdomen or pelvis. 2. No significant change in the subcentimeter prominent left iliac lymph nodes.     REVIEW OF SYSTEMS:   Constitutional: Denies fevers, chills or abnormal weight loss Eyes: Denies blurriness of vision Ears, nose, mouth, throat, and face: Denies mucositis or sore throat Respiratory: Denies cough, dyspnea or wheezes Cardiovascular: Denies palpitation, chest discomfort or lower extremity swelling Gastrointestinal:  Denies nausea, heartburn or change in bowel habits Skin: Denies abnormal skin rashes Lymphatics: Denies new lymphadenopathy or easy bruising Neurological:Denies numbness, tingling or new weaknesses Behavioral/Psych: Mood is stable, no new changes  All other systems were reviewed with the patient and are negative.  I have reviewed the past medical history, past surgical history, social history and family history with the patient and they are unchanged from previous note.  ALLERGIES:  is allergic to [employer] tartrate] and cortisone.  MEDICATIONS:  Current Outpatient Medications  Medication Sig Dispense Refill   amLODipine (NORVASC) 10 MG tablet Take 1 tablet (10 mg total) by mouth daily. 30 tablet 11   Armodafinil 250 MG tablet Take 1  tablet (250 mg total) by mouth every morning. 90 tablet 1   bevacizumab in sodium chloride 0.9 % 100 mL Inject into the vein once.     busPIRone (BUSPAR) 7.5 MG tablet Take 15 mg by mouth 2 (two) times daily.     chlorthalidone (HYGROTON) 25 MG tablet Take 25 mg by mouth daily.     cholecalciferol (VITAMIN D3) 25 MCG (1000 UT) tablet Take 1,000 Units by mouth daily.     colestipol (COLESTID) 1 g tablet Take 0.5-1 g by mouth daily. Pt taking 1/2 to 1 tablet daily depending on meal choice; taken 1 hour after  morning meds , and can not eat for another hour     eszopiclone (LUNESTA) 2 MG TABS tablet Take immediately before bedtime 90 tablet 1   FLUoxetine (PROZAC) 20 MG capsule Take 40 mg by mouth every morning. Takes with a 1m capsule for her total dose of 361m     lidocaine-prilocaine (EMLA) cream Apply 1 application topically as needed. 30 g 1   MELATONIN PO Take 10 mg by mouth at bedtime.      metFORMIN (GLUCOPHAGE-XR) 500 MG 24 hr tablet Take 500 mg by mouth daily with breakfast.   3   metoprolol tartrate (LOPRESSOR) 25 MG tablet Take 1 tablet (25 mg total) by mouth 2 (two) times daily. 60 tablet 3   Multiple Vitamin (MULTIVITAMIN WITH MINERALS) TABS Take 1 tablet by mouth every morning.      rosuvastatin (CRESTOR) 20 MG tablet Take 20 mg by mouth every morning.      telmisartan (MICARDIS) 40 MG tablet Take 40 mg by mouth daily.     No current facility-administered medications for this visit.   Facility-Administered Medications Ordered in Other Visits  Medication Dose Route Frequency Provider Last Rate Last Admin   sodium chloride flush (NS) 0.9 % injection 10 mL  10 mL Intravenous PRN GeNancy MarusMD   10 mL at 05/12/17 [id number] sodium chloride flush (NS) 0.9 % injection 10 mL  10 mL Intracatheter PRN GoHeath LarkMD   10 mL at 01/16/21 1050    PHYSICAL EXAMINATION: ECOG PERFORMANCE STATUS: 1 - Symptomatic but completely ambulatory  Vitals:   01/16/21 0831  BP: 123/69  Pulse: 66  Resp: 18  Temp: 97.7 F (36.5 C)  SpO2: 99%   Filed Weights   01/16/21 0831  Weight: 194 lb 1.6 oz (88 kg)    GENERAL:alert, no distress and comfortable SKIN: skin color, texture, turgor are normal, no rashes or significant lesions EYES: normal, Conjunctiva are pink and non-injected, sclera clear OROPHARYNX:no exudate, no erythema and lips, buccal mucosa, and tongue normal  NECK: supple, thyroid normal size, non-tender, without nodularity LYMPH:  no palpable lymphadenopathy in the cervical,  axillary or inguinal LUNGS: clear to auscultation and percussion with normal breathing effort HEART: regular rate & rhythm and no murmurs with mild bilateral lower extremity edema ABDOMEN:abdomen soft, non-tender and normal bowel sounds Musculoskeletal:no cyanosis of digits and no clubbing  NEURO: alert & oriented x 3 with fluent speech, no focal motor/sensory deficits  LABORATORY DATA:  I have reviewed the data as listed    Component Value Date/Time   NA 142 01/16/2021 0808   NA 141 12/02/2016 1219   K 3.8 01/16/2021 0808   K 3.7 12/02/2016 1219   CL 104 01/16/2021 0808   CL 103 01/18/2013 1329   CO2 27 01/16/2021 0808   CO2 27 12/02/2016 1219   GLUCOSE 115 (H) 01/16/2021  2620   GLUCOSE 98 12/02/2016 1219   GLUCOSE 110 (H) 01/18/2013 1329   BUN 21 01/16/2021 0808   BUN 15.3 12/02/2016 1219   CREATININE 0.76 01/16/2021 0808   CREATININE 0.73 07/30/2020 0716   CREATININE 0.7 12/02/2016 1219   CALCIUM 9.8 01/16/2021 0808   CALCIUM 10.1 12/02/2016 1219   PROT 7.2 01/16/2021 0808   PROT 7.1 12/02/2016 1219   ALBUMIN 4.1 01/16/2021 0808   ALBUMIN 4.2 12/02/2016 1219   AST 29 01/16/2021 0808   AST 29 07/30/2020 0716   AST 21 12/02/2016 1219   ALT 31 01/16/2021 0808   ALT 37 07/30/2020 0716   ALT 27 12/02/2016 1219   ALKPHOS 80 01/16/2021 0808   ALKPHOS 104 12/02/2016 1219   BILITOT 0.6 01/16/2021 0808   BILITOT 0.5 07/30/2020 0716   BILITOT 0.37 12/02/2016 1219   GFRNONAA >60 01/16/2021 0808   GFRNONAA >60 07/30/2020 0716   GFRAA >60 05/07/2020 0833    No results found for: SPEP, UPEP  Lab Results  Component Value Date   WBC 5.5 01/16/2021   NEUTROABS 3.2 01/16/2021   HGB 14.2 01/16/2021   HCT 43.3 01/16/2021   MCV 92.9 01/16/2021   PLT 133 (L) 01/16/2021      Chemistry      Component Value Date/Time   NA 142 01/16/2021 0808   NA 141 12/02/2016 1219   K 3.8 01/16/2021 0808   K 3.7 12/02/2016 1219   CL 104 01/16/2021 0808   CL 103 01/18/2013 1329   CO2  27 01/16/2021 0808   CO2 27 12/02/2016 1219   BUN 21 01/16/2021 0808   BUN 15.3 12/02/2016 1219   CREATININE 0.76 01/16/2021 0808   CREATININE 0.73 07/30/2020 0716   CREATININE 0.7 12/02/2016 1219   GLU 236 12/13/2012 1558      Component Value Date/Time   CALCIUM 9.8 01/16/2021 0808   CALCIUM 10.1 12/02/2016 1219   ALKPHOS 80 01/16/2021 0808   ALKPHOS 104 12/02/2016 1219   AST 29 01/16/2021 0808   AST 29 07/30/2020 0716   AST 21 12/02/2016 1219   ALT 31 01/16/2021 0808   ALT 37 07/30/2020 0716   ALT 27 12/02/2016 1219   BILITOT 0.6 01/16/2021 0808   BILITOT 0.5 07/30/2020 0716   BILITOT 0.37 12/02/2016 1219

## 2021-01-16 NOTE — Progress Notes (Signed)
[facility] Note  Accompanied Salimata to her appointment with Dr Alvy Bimler and visited in infusion, providing opportunity for her to share and process how she is interacting with her grief at her son's death on 01-16-23. So far, Alacia has been in task-tackling mode and notes that she is ready to shift into more active expression and processing of her feelings. She reports good support from family, friends, and faith community ([place]) and is planning a 10-day trip to [place] later this month to celebrate her granddaughter's graduation and to receive emotional support from her sister.  We plan to follow up when she is next on campus because phone calls are still overwhelming, and she knows to reach out as desired. Violeta is also aware of chaplain availability to help with a memorial service for her son (tentatively planned for July).   [place], [place], [facility] Pager [phone] Voicemail [phone]

## 2021-01-16 NOTE — Patient Instructions (Signed)
[facility]  Discharge Instructions: Thank you for choosing [facility] to provide your oncology and hematology care.   If you have a lab appointment with the Cancer Center, please go directly to the Cancer Center and check in at the registration area.   Wear comfortable clothing and clothing appropriate for easy access to any Portacath or PICC line.   We strive to give you quality time with your provider. You may need to reschedule your appointment if you arrive late (15 or more minutes).  Arriving late affects you and other patients whose appointments are after yours.  Also, if you miss three or more appointments without notifying the office, you may be dismissed from the clinic at the provider's discretion.      For prescription refill requests, have your pharmacy contact our office and allow 72 hours for refills to be completed.    Today you received the following chemotherapy and/or immunotherapy agents Avastin       To help prevent nausea and vomiting after your treatment, we encourage you to take your nausea medication as directed.  BELOW ARE SYMPTOMS THAT SHOULD BE REPORTED IMMEDIATELY: *FEVER GREATER THAN 100.4 F (38 C) OR HIGHER *CHILLS OR SWEATING *NAUSEA AND VOMITING THAT IS NOT CONTROLLED WITH YOUR NAUSEA MEDICATION *UNUSUAL SHORTNESS OF BREATH *UNUSUAL BRUISING OR BLEEDING *URINARY PROBLEMS (pain or burning when urinating, or frequent urination) *BOWEL PROBLEMS (unusual diarrhea, constipation, pain near the anus) TENDERNESS IN MOUTH AND THROAT WITH OR WITHOUT PRESENCE OF ULCERS (sore throat, sores in mouth, or a toothache) UNUSUAL RASH, SWELLING OR PAIN  UNUSUAL VAGINAL DISCHARGE OR ITCHING   Items with * indicate a potential emergency and should be followed up as soon as possible or go to the Emergency Department if any problems should occur.  Please show the CHEMOTHERAPY ALERT CARD or IMMUNOTHERAPY ALERT CARD at check-in to  the Emergency Department and triage nurse.  Should you have questions after your visit or need to cancel or reschedule your appointment, please contact [facility]  Dept: [phone]  and follow the prompts.  Office hours are 8:00 a.m. to 4:30 p.m. Monday - Friday. Please note that voicemails left after 4:00 p.m. may not be returned until the following business day.  We are closed weekends and major holidays. You have access to a nurse at all times for urgent questions. Please call the main number to the clinic Dept: [phone] and follow the prompts.   For any non-urgent questions, you may also contact your provider using MyChart. We now offer e-Visits for anyone 18 and older to request care online for non-urgent symptoms. For details visit [email or web address].   Also download the MyChart app! Go to the app store, search "MyChart", open the app, select [facility], and log in with your MyChart username and password.  Due to Covid, a mask is required upon entering the hospital/clinic. If you do not have a mask, one will be given to you upon arrival. For doctor visits, patients may have 1 support person aged 18 or older with them. For treatment visits, patients cannot have anyone with them due to current Covid guidelines and our immunocompromised population.   

## 2021-01-16 NOTE — Assessment & Plan Note (Signed)
Her thrombocytopenia is stable Observe only for now 

## 2021-01-16 NOTE — Assessment & Plan Note (Signed)
Her last CT imaging show no evidence of disease She is interested to continue on maintenance bevacizumab I do not recommend repeating CT imaging more than once a year unless she has new symptoms She will continue treatment every 3 weeks and I will see her every other time

## 2021-02-03 ENCOUNTER — Telehealth: Payer: Self-pay | Admitting: Hematology and Oncology

## 2021-02-03 NOTE — Telephone Encounter (Signed)
Rescheduled per provider request. Called and spoke with pt confirmed new appt date and time 7/21

## 2021-02-05 ENCOUNTER — Encounter: Payer: Self-pay | Admitting: General Practice

## 2021-02-05 ENCOUNTER — Inpatient Hospital Stay: Payer: Medicare HMO

## 2021-02-05 ENCOUNTER — Other Ambulatory Visit: Payer: Self-pay

## 2021-02-05 VITALS — BP 124/80 | HR 65 | Temp 98.0°F | Resp 16 | Wt 196.0 lb

## 2021-02-05 DIAGNOSIS — D696 Thrombocytopenia, unspecified: Secondary | ICD-10-CM | POA: Diagnosis not present

## 2021-02-05 DIAGNOSIS — Z79899 Other long term (current) drug therapy: Secondary | ICD-10-CM | POA: Diagnosis not present

## 2021-02-05 DIAGNOSIS — R600 Localized edema: Secondary | ICD-10-CM | POA: Diagnosis not present

## 2021-02-05 DIAGNOSIS — Z90710 Acquired absence of both cervix and uterus: Secondary | ICD-10-CM | POA: Diagnosis not present

## 2021-02-05 DIAGNOSIS — C5701 Malignant neoplasm of right fallopian tube: Secondary | ICD-10-CM

## 2021-02-05 DIAGNOSIS — Z7984 Long term (current) use of oral hypoglycemic drugs: Secondary | ICD-10-CM | POA: Diagnosis not present

## 2021-02-05 DIAGNOSIS — C5700 Malignant neoplasm of unspecified fallopian tube: Secondary | ICD-10-CM

## 2021-02-05 DIAGNOSIS — Z7189 Other specified counseling: Secondary | ICD-10-CM

## 2021-02-05 DIAGNOSIS — Z5112 Encounter for antineoplastic immunotherapy: Secondary | ICD-10-CM | POA: Diagnosis not present

## 2021-02-05 LAB — CBC WITH DIFFERENTIAL/PLATELET
Abs Immature Granulocytes: 0.02 10*3/uL (ref 0.00–0.07)
Basophils Absolute: 0 10*3/uL (ref 0.0–0.1)
Basophils Relative: 0 %
Eosinophils Absolute: 0.1 10*3/uL (ref 0.0–0.5)
Eosinophils Relative: 1 %
HCT: 43.5 % (ref 36.0–46.0)
Hemoglobin: 14.6 g/dL (ref 12.0–15.0)
Immature Granulocytes: 0 %
Lymphocytes Relative: 28 %
Lymphs Abs: 2 10*3/uL (ref 0.7–4.0)
MCH: 30.7 pg (ref 26.0–34.0)
MCHC: 33.6 g/dL (ref 30.0–36.0)
MCV: 91.4 fL (ref 80.0–100.0)
Monocytes Absolute: 0.6 10*3/uL (ref 0.1–1.0)
Monocytes Relative: 9 %
Neutro Abs: 4.4 10*3/uL (ref 1.7–7.7)
Neutrophils Relative %: 62 %
Platelets: 151 10*3/uL (ref 150–400)
RBC: 4.76 MIL/uL (ref 3.87–5.11)
RDW: 13.4 % (ref 11.5–15.5)
WBC: 7.1 10*3/uL (ref 4.0–10.5)
nRBC: 0 % (ref 0.0–0.2)

## 2021-02-05 LAB — COMPREHENSIVE METABOLIC PANEL
ALT: 39 U/L (ref 0–44)
AST: 28 U/L (ref 15–41)
Albumin: 4.2 g/dL (ref 3.5–5.0)
Alkaline Phosphatase: 88 U/L (ref 38–126)
Anion gap: 9 (ref 5–15)
BUN: 16 mg/dL (ref 8–23)
CO2: 27 mmol/L (ref 22–32)
Calcium: 9.6 mg/dL (ref 8.9–10.3)
Chloride: 103 mmol/L (ref 98–111)
Creatinine, Ser: 0.81 mg/dL (ref 0.44–1.00)
GFR, Estimated: >60 mL/min (ref >60)
Glucose, Bld: 116 mg/dL — ABNORMAL HIGH (ref 70–99)
Potassium: 4.2 mmol/L (ref 3.5–5.1)
Sodium: 139 mmol/L (ref 135–145)
Total Bilirubin: 0.5 mg/dL (ref 0.3–1.2)
Total Protein: 7.4 g/dL (ref 6.5–8.1)

## 2021-02-05 LAB — TOTAL PROTEIN, URINE DIPSTICK: Protein, ur: <30 mg/dL — AB

## 2021-02-05 MED ORDER — SODIUM CHLORIDE 0.9 % IV SOLN
Freq: Once | INTRAVENOUS | Status: AC
  Filled 2021-02-05: qty 250

## 2021-02-05 MED ORDER — HEPARIN SOD (PORK) LOCK FLUSH 100 UNIT/ML IV SOLN
500.0000 [IU] | Freq: Once | INTRAVENOUS | Status: AC | PRN
  Administered 2021-02-05: 500 [IU]
  Filled 2021-02-05: qty 5

## 2021-02-05 MED ORDER — SODIUM CHLORIDE 0.9 % IV SOLN
15.0000 mg/kg | Freq: Once | INTRAVENOUS | Status: AC
  Administered 2021-02-05: 1300 mg via INTRAVENOUS
  Filled 2021-02-05: qty 48

## 2021-02-05 MED ORDER — SODIUM CHLORIDE 0.9% FLUSH
10.0000 mL | Freq: Once | INTRAVENOUS | Status: AC
Start: 2021-02-05 — End: 2021-02-05
  Administered 2021-02-05: 10 mL
  Filled 2021-02-05: qty 10

## 2021-02-05 MED ORDER — SODIUM CHLORIDE 0.9% FLUSH
10.0000 mL | INTRAVENOUS | Status: DC | PRN
Start: 2021-02-05 — End: 2021-02-05
  Administered 2021-02-05: 10 mL
  Filled 2021-02-05: qty 10

## 2021-02-05 NOTE — Patient Instructions (Signed)
[facility]  Discharge Instructions: °Thank you for choosing [facility] to provide your oncology and hematology care.  ° °If you have a lab appointment with the Cancer Center, please go directly to the Cancer Center and check in at the registration area. °  °Wear comfortable clothing and clothing appropriate for easy access to any Portacath or PICC line.  ° °We strive to give you quality time with your provider. You may need to reschedule your appointment if you arrive late (15 or more minutes).  Arriving late affects you and other patients whose appointments are after yours.  Also, if you miss three or more appointments without notifying the office, you may be dismissed from the clinic at the provider’s discretion.    °  °For prescription refill requests, have your pharmacy contact our office and allow 72 hours for refills to be completed.   ° °Today you received the following chemotherapy and/or immunotherapy agents: bevacizumab    °  °To help prevent nausea and vomiting after your treatment, we encourage you to take your nausea medication as directed. ° °BELOW ARE SYMPTOMS THAT SHOULD BE REPORTED IMMEDIATELY: °*FEVER GREATER THAN 100.4 F (38 °C) OR HIGHER °*CHILLS OR SWEATING °*NAUSEA AND VOMITING THAT IS NOT CONTROLLED WITH YOUR NAUSEA MEDICATION °*UNUSUAL SHORTNESS OF BREATH °*UNUSUAL BRUISING OR BLEEDING °*URINARY PROBLEMS (pain or burning when urinating, or frequent urination) °*BOWEL PROBLEMS (unusual diarrhea, constipation, pain near the anus) °TENDERNESS IN MOUTH AND THROAT WITH OR WITHOUT PRESENCE OF ULCERS (sore throat, sores in mouth, or a toothache) °UNUSUAL RASH, SWELLING OR PAIN  °UNUSUAL VAGINAL DISCHARGE OR ITCHING  ° °Items with * indicate a potential emergency and should be followed up as soon as possible or go to the Emergency Department if any problems should occur. ° °Please show the CHEMOTHERAPY ALERT CARD or IMMUNOTHERAPY ALERT CARD at check-in  to the Emergency Department and triage nurse. ° °Should you have questions after your visit or need to cancel or reschedule your appointment, please contact [facility]  Dept: [phone]  and follow the prompts.  Office hours are 8:00 a.m. to 4:30 p.m. Monday - Friday. Please note that voicemails left after 4:00 p.m. may not be returned until the following business day.  We are closed weekends and major holidays. You have access to a nurse at all times for urgent questions. Please call the main number to the clinic Dept: [phone] and follow the prompts. ° ° °For any non-urgent questions, you may also contact your provider using MyChart. We now offer e-Visits for anyone 18 and older to request care online for non-urgent symptoms. For details visit [email or web address]. °  °Also download the MyChart app! Go to the app store, search "MyChart", open the app, select [facility], and log in with your MyChart username and password. ° °Due to Covid, a mask is required upon entering the hospital/clinic. If you do not have a mask, one will be given to you upon arrival. For doctor visits, patients may have 1 support person aged 18 or older with them. For treatment visits, patients cannot have anyone with them due to current Covid guidelines and our immunocompromised population.  ° °

## 2021-02-05 NOTE — Progress Notes (Signed)
[name] Spiritual Care Note  Followed up with Kristin Webb in infusion, providing opportunity for her to "dump her bucket," emotionally speaking. She continues to process her son's death by suicide and just had a meaningful, healing visit with extended family in [place]. One powerful question she is asking regularly is, "How can I be good to myself today?" Planning her son's memorial service at her home [employer] congregation, [place], is another way that she is reflecting on her son's life and challenges, as well as on her family's healing. Provided empathic listening, pastoral reflection, affirmation of strengths, and encouragement.  [name] knows how to reach out as needed, and we plan to follow up at her next treatment.   [place], [place], [facility] Pager [phone] Voicemail [phone]

## 2021-02-07 ENCOUNTER — Encounter: Payer: Self-pay | Admitting: Hematology and Oncology

## 2021-02-12 DIAGNOSIS — E782 Mixed hyperlipidemia: Secondary | ICD-10-CM | POA: Diagnosis not present

## 2021-02-12 DIAGNOSIS — M159 Polyosteoarthritis, unspecified: Secondary | ICD-10-CM | POA: Diagnosis not present

## 2021-02-12 DIAGNOSIS — E1159 Type 2 diabetes mellitus with other circulatory complications: Secondary | ICD-10-CM | POA: Diagnosis not present

## 2021-02-12 DIAGNOSIS — I10 Essential (primary) hypertension: Secondary | ICD-10-CM | POA: Diagnosis not present

## 2021-02-18 ENCOUNTER — Encounter: Payer: Self-pay | Admitting: General Practice

## 2021-02-18 NOTE — Progress Notes (Signed)
[facility] Spiritual Care Note  Attempted pastoral follow-up call by phone because Ellenor's son's memorial service was scheduled for this past weekend, but left voicemail of care and support with encouragement to return call as needed/desired.   [place], [place], [facility] Pager [phone] Voicemail [phone]

## 2021-02-26 ENCOUNTER — Other Ambulatory Visit: Payer: Medicare HMO

## 2021-02-26 ENCOUNTER — Ambulatory Visit: Payer: Medicare HMO | Admitting: Hematology and Oncology

## 2021-02-26 ENCOUNTER — Ambulatory Visit: Payer: Medicare HMO

## 2021-02-27 ENCOUNTER — Other Ambulatory Visit: Payer: Self-pay

## 2021-02-27 ENCOUNTER — Inpatient Hospital Stay: Payer: Medicare HMO

## 2021-02-27 ENCOUNTER — Encounter: Payer: Self-pay | Admitting: Hematology and Oncology

## 2021-02-27 ENCOUNTER — Inpatient Hospital Stay: Payer: Medicare HMO | Attending: Gynecologic Oncology

## 2021-02-27 ENCOUNTER — Inpatient Hospital Stay: Payer: Medicare HMO | Admitting: Hematology and Oncology

## 2021-02-27 VITALS — BP 148/80 | HR 69 | Temp 98.1°F | Resp 18 | Ht 59.0 in | Wt 198.0 lb

## 2021-02-27 DIAGNOSIS — C5700 Malignant neoplasm of unspecified fallopian tube: Secondary | ICD-10-CM

## 2021-02-27 DIAGNOSIS — C5701 Malignant neoplasm of right fallopian tube: Secondary | ICD-10-CM | POA: Diagnosis not present

## 2021-02-27 DIAGNOSIS — I10 Essential (primary) hypertension: Secondary | ICD-10-CM | POA: Insufficient documentation

## 2021-02-27 DIAGNOSIS — Z7189 Other specified counseling: Secondary | ICD-10-CM

## 2021-02-27 DIAGNOSIS — D696 Thrombocytopenia, unspecified: Secondary | ICD-10-CM

## 2021-02-27 DIAGNOSIS — Z5112 Encounter for antineoplastic immunotherapy: Secondary | ICD-10-CM | POA: Insufficient documentation

## 2021-02-27 DIAGNOSIS — Z90710 Acquired absence of both cervix and uterus: Secondary | ICD-10-CM | POA: Insufficient documentation

## 2021-02-27 DIAGNOSIS — Z7984 Long term (current) use of oral hypoglycemic drugs: Secondary | ICD-10-CM | POA: Insufficient documentation

## 2021-02-27 DIAGNOSIS — Z79899 Other long term (current) drug therapy: Secondary | ICD-10-CM | POA: Insufficient documentation

## 2021-02-27 DIAGNOSIS — Z9049 Acquired absence of other specified parts of digestive tract: Secondary | ICD-10-CM | POA: Insufficient documentation

## 2021-02-27 LAB — CMP (CANCER CENTER ONLY)
ALT: 36 U/L (ref 0–44)
AST: 30 U/L (ref 15–41)
Albumin: 4 g/dL (ref 3.5–5.0)
Alkaline Phosphatase: 76 U/L (ref 38–126)
Anion gap: 7 (ref 5–15)
BUN: 22 mg/dL (ref 8–23)
CO2: 29 mmol/L (ref 22–32)
Calcium: 10.2 mg/dL (ref 8.9–10.3)
Chloride: 104 mmol/L (ref 98–111)
Creatinine: 0.66 mg/dL (ref 0.44–1.00)
GFR, Estimated: >60 mL/min (ref >60)
Glucose, Bld: 120 mg/dL — ABNORMAL HIGH (ref 70–99)
Potassium: 4 mmol/L (ref 3.5–5.1)
Sodium: 140 mmol/L (ref 135–145)
Total Bilirubin: 0.9 mg/dL (ref 0.3–1.2)
Total Protein: 6.9 g/dL (ref 6.5–8.1)

## 2021-02-27 LAB — CBC WITH DIFFERENTIAL/PLATELET
Abs Immature Granulocytes: 0.02 10*3/uL (ref 0.00–0.07)
Basophils Absolute: 0 10*3/uL (ref 0.0–0.1)
Basophils Relative: 1 %
Eosinophils Absolute: 0.1 10*3/uL (ref 0.0–0.5)
Eosinophils Relative: 2 %
HCT: 39.8 % (ref 36.0–46.0)
Hemoglobin: 13.4 g/dL (ref 12.0–15.0)
Immature Granulocytes: 0 %
Lymphocytes Relative: 31 %
Lymphs Abs: 1.5 10*3/uL (ref 0.7–4.0)
MCH: 30.4 pg (ref 26.0–34.0)
MCHC: 33.7 g/dL (ref 30.0–36.0)
MCV: 90.2 fL (ref 80.0–100.0)
Monocytes Absolute: 0.5 10*3/uL (ref 0.1–1.0)
Monocytes Relative: 10 %
Neutro Abs: 2.9 10*3/uL (ref 1.7–7.7)
Neutrophils Relative %: 56 %
Platelets: 141 10*3/uL — ABNORMAL LOW (ref 150–400)
RBC: 4.41 MIL/uL (ref 3.87–5.11)
RDW: 13.4 % (ref 11.5–15.5)
WBC: 5.1 10*3/uL (ref 4.0–10.5)
nRBC: 0 % (ref 0.0–0.2)

## 2021-02-27 LAB — TOTAL PROTEIN, URINE DIPSTICK: Protein, ur: NEGATIVE mg/dL

## 2021-02-27 MED ORDER — HEPARIN SOD (PORK) LOCK FLUSH 100 UNIT/ML IV SOLN
500.0000 [IU] | Freq: Once | INTRAVENOUS | Status: AC | PRN
  Administered 2021-02-27: 500 [IU]
  Filled 2021-02-27: qty 5

## 2021-02-27 MED ORDER — SODIUM CHLORIDE 0.9 % IV SOLN
Freq: Once | INTRAVENOUS | Status: AC
  Filled 2021-02-27: qty 250

## 2021-02-27 MED ORDER — SODIUM CHLORIDE 0.9% FLUSH
10.0000 mL | INTRAVENOUS | Status: DC | PRN
  Administered 2021-02-27: 10 mL
  Filled 2021-02-27: qty 10

## 2021-02-27 MED ORDER — SODIUM CHLORIDE 0.9 % IV SOLN
15.0000 mg/kg | Freq: Once | INTRAVENOUS | Status: AC
  Administered 2021-02-27: 1300 mg via INTRAVENOUS
  Filled 2021-02-27: qty 48

## 2021-02-27 MED ORDER — SODIUM CHLORIDE 0.9% FLUSH
10.0000 mL | Freq: Once | INTRAVENOUS | Status: AC
Start: 2021-02-27 — End: 2021-02-27
  Administered 2021-02-27: 10 mL
  Filled 2021-02-27: qty 10

## 2021-02-27 NOTE — Patient Instructions (Signed)
[facility]  Discharge Instructions: Thank you for choosing [facility] to provide your oncology and hematology care.   If you have a lab appointment with the Cancer Center, please go directly to the Cancer Center and check in at the registration area.   Wear comfortable clothing and clothing appropriate for easy access to any Portacath or PICC line.   We strive to give you quality time with your provider. You may need to reschedule your appointment if you arrive late (15 or more minutes).  Arriving late affects you and other patients whose appointments are after yours.  Also, if you miss three or more appointments without notifying the office, you may be dismissed from the clinic at the provider's discretion.      For prescription refill requests, have your pharmacy contact our office and allow 72 hours for refills to be completed.    Today you received the following chemotherapy and/or immunotherapy agents Avastin       To help prevent nausea and vomiting after your treatment, we encourage you to take your nausea medication as directed.  BELOW ARE SYMPTOMS THAT SHOULD BE REPORTED IMMEDIATELY: *FEVER GREATER THAN 100.4 F (38 C) OR HIGHER *CHILLS OR SWEATING *NAUSEA AND VOMITING THAT IS NOT CONTROLLED WITH YOUR NAUSEA MEDICATION *UNUSUAL SHORTNESS OF BREATH *UNUSUAL BRUISING OR BLEEDING *URINARY PROBLEMS (pain or burning when urinating, or frequent urination) *BOWEL PROBLEMS (unusual diarrhea, constipation, pain near the anus) TENDERNESS IN MOUTH AND THROAT WITH OR WITHOUT PRESENCE OF ULCERS (sore throat, sores in mouth, or a toothache) UNUSUAL RASH, SWELLING OR PAIN  UNUSUAL VAGINAL DISCHARGE OR ITCHING   Items with * indicate a potential emergency and should be followed up as soon as possible or go to the Emergency Department if any problems should occur.  Please show the CHEMOTHERAPY ALERT CARD or IMMUNOTHERAPY ALERT CARD at check-in to  the Emergency Department and triage nurse.  Should you have questions after your visit or need to cancel or reschedule your appointment, please contact [facility]  Dept: [phone]  and follow the prompts.  Office hours are 8:00 a.m. to 4:30 p.m. Monday - Friday. Please note that voicemails left after 4:00 p.m. may not be returned until the following business day.  We are closed weekends and major holidays. You have access to a nurse at all times for urgent questions. Please call the main number to the clinic Dept: [phone] and follow the prompts.   For any non-urgent questions, you may also contact your provider using MyChart. We now offer e-Visits for anyone 18 and older to request care online for non-urgent symptoms. For details visit [email or web address].   Also download the MyChart app! Go to the app store, search "MyChart", open the app, select [facility], and log in with your MyChart username and password.  Due to Covid, a mask is required upon entering the hospital/clinic. If you do not have a mask, one will be given to you upon arrival. For doctor visits, patients may have 1 support person aged 18 or older with them. For treatment visits, patients cannot have anyone with them due to current Covid guidelines and our immunocompromised population.   

## 2021-02-27 NOTE — Progress Notes (Signed)
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[place]) Overall, she tolerated treatment well Her documented blood pressure from home were within normal range She has intermittent nausea and chronic loose stool which is unchanged Recommend follow-up with GI service I plan to order CT imaging first week of September for further follow-up  Essential hypertension Her blood pressure is well controlled She will continue close monitoring  Thrombocytopenia (HCC) Her thrombocytopenia is stable Observe only for now  Orders Placed This Encounter  Procedures   CT ABDOMEN PELVIS W CONTRAST    Standing Status:   Future    Standing Expiration Date:   02/27/2022    Order Specific Question:   If indicated for the ordered procedure, I authorize the administration of contrast media per Radiology protocol    Answer:   Yes    Order Specific Question:   Preferred imaging location?    Answer:   [facility]    Order Specific Question:   Radiology Contrast Protocol - do NOT remove file path    Answer:   [email or web address]    All questions were answered. The patient knows to call the clinic with any problems, questions or concerns. The total time spent in the appointment was 20 minutes encounter with patients including review of chart and various tests results, discussions about plan of care and coordination of care plan   [name], MD 02/27/2021 9:45 AM  INTERVAL HISTORY: Please see below for problem oriented charting. 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  Interval development of large mid abdominal mass highly concerning for right ovarian cancer.  There is mild omental nodularity on the left, and peritoneal disease cannot be completely excluded.  There is no ascites or other evidence of metastatic disease. 2.  Mild associated renal pelvocaliectasis bilaterally without obstruction.  Nonobstructing left renal calculus and a small right renal angiomyolipoma noted incidentally.    11/08/2012 Pathology Results   1. 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 - BENIGN FALLOPIAN TUBE WITH NONSPECIFIC CHRONIC INFLAMMATION. 2. Ovary and fallopian tube, left - BENIGN OVARY; NEGATIVE FOR ATYPIA OR MALIGNANCY. - BENIGN FALLOPIAN TUBE; NEGATIVE FOR ATYPIA OR MALIGNANCY. 3. Omentum, resection for tumor - HIGH GRADE CARCINOMA, SEE COMMENT. 4. Appendix, Other than Incidental - FIBROUS OBLITERATION OF APPENDICEAL TIP. - NEGATIVE FOR MALIGNANCY.    11/08/2012 Surgery   Surgery: Exploratory laparotomy, bilateral salpingo-oophorectomy, appendectomy, infacolic omentectomy, optimal debulking   Surgeons:  Paola A.  Alycia Rossetti,  MD; Lahoma Crocker, MD    Assistant: Caswell Corwin  Pathology: Bilateral fallopian tubes and ovaries to pathology. Appendix as well as omentum. 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   Operative findings: 25 cm right adnexal mass with smooth surface. Surgically absent uterus. Atrophic-appearing left ovary. Normal appearing appendix. Within the omentum there were centimeter nodules scattered throughout the omentum. The remainder of the surfaces were benign.    12/08/2012 Procedure   Impression:  Placement of a subcutaneous port device.  The catheter tip is in the lower SVC and ready to be used.      12/13/2012 - 03/28/2013 Chemotherapy   s/p 6 cycles of paclitaxel and carboplatin    12/13/2012 - 03/28/2013 Chemotherapy   The patient had 6 cycles of carboplatin and Taxol    03/24/2013 Imaging   US abdomen    04/24/2013 Imaging   CT abdomen Interval resection of the large right pelvic and lower abdominal mass lesion with apparent omentectomy.  No evidence for intraperitoneal free fluid on today's study.  No discernible peritoneal lesions.   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    09/07/2013 Genetic Testing   Patient has genetic testing done for BRCA1/2 panel Results revealed patient has no mutation(s):    07/09/2015 Imaging   CT abdomen 1. 12 mm obstructive calculus at the left ureteropelvic junction with moderate proximal hydronephrosis. 2. 2 small supraumbilical ventral hernias, one containing a short segment of the mid transverse colon and the other containing a short segment of the mid small bowel. There is no associated evidence to suggest bowel incarceration or obstruction at this time. 3. Tiny locule of gas non dependently in the lumen of the urinary bladder. This is presumably iatrogenic related to recent catheterization for urinalysis. Alternatively, this could be seen in the setting  of urinary tract infection with gas-forming organisms. Clinical correlation for history of recent catheterization is recommended. 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 5. Status post cholecystectomy. 6. Additional incidental findings, as above.      12/11/2016 Imaging   Ct abdomen 1. No evidence of metastatic ovarian cancer. 2. Recurrent subxiphoid ventral abdominal wall hernia containing transverse colon. No evidence of incarceration or obstruction. 3. Stable incidental findings in the liver and kidneys. No recurrent urinary tract calculus. 4. Progressive lower lumbar spondylosis. 5.  Aortic Atherosclerosis (ICD10-I70.0).      08/30/2017 Imaging   MRI thoracic spine 1. At T5-6 there is a small central disc protrusion contacting the ventral thoracic spinal cord. No central canal or foraminal stenosis. 2. At T9-10 there is a small right paracentral disc protrusion. 3.  No acute osseous injury of the thoracic spine. 4. No aggressive osseous lesion to suggest metastatic disease.    09/24/2017 Tumor Marker   Patient's tumor was tested for the following markers: CA-125 Results of the tumor marker test revealed 21.7    09/30/2017 Imaging   CT abdomen 1. New small clustered soft tissue nodules in the left lower quadrant in the sigmoid mesentery, largest 1.0 cm, which could represent recurrent peritoneal tumor implants. No ascites.  2. Midline high ventral abdominal wall hernia containing a portion of the transverse colon is mildly increased in size, and without bowel complication at this time. 3. Chronic findings include: Aortic Atherosclerosis (ICD10-I70.0). Diffuse hepatic steatosis. 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  10/11/2017 PET scan   1. Nodules in the sigmoid mesentery are hypermetabolic and highly worrisome for metastatic disease. 2. Attic steatosis.      11/03/2017 Procedure   Successful  CT-guided rectus abdominal muscle mass core biopsy. Path: - FOREIGN BODY GIANT CELL REACTION INVOLVING FIBROADIPOSE TISSUE AND SKELETAL MUSCLE. - NO EVIDENCE OF MALIGNANCY.   11/04/2017 Cancer Staging   Staging form: Fallopian Tube, AJCC 7th Edition - Clinical: FIGO Stage IIIC, calculated as Stage IV (rT3, N0, M1) - Signed by [name], MD on 08/09/2019    02/2018 Imaging   CT: 1. Continued increase in size small peritoneal nodules along the mesenteric border of the proximal sigmoid colon as well as along the serosal surface of the proximal sigmoid colon. 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 2. No evidence of distant disease. 3. Stable large ventral hernia.   05/2018 Imaging   PET: 1. Redemonstration of hypermetabolic nodules within the sigmoid mesocolon. Mild response to therapy relative to CT of 02/24/2018. Mixed response to therapy compared to the most recent PET of 10/11/2017. 2. Hypermetabolism within the right pelvic rectus musculature, increased since the prior PET.  Clinical service requested comparison to the 11/24/2017 CT. Index 10 mm nodule within the sigmoid mesocolon was similar to the 02/24/2018 CT, and as described on that exam, increased from 7 mm on 11/24/2017. More inferior nodule within the mesocolon measures 12 mm today on image 156/4 and 8 mm on 11/24/2017.   05/2018 Imaging   CT: IMPRESSION: 1. Since 02/24/2018, decreased size of peritoneal nodules centered in the sigmoid mesocolon. 2. No evidence of new or progressive disease. 3. Hepatic steatosis. 4. Subcentimeter right renal angiomyolipoma, similar. 5.  Aortic Atherosclerosis (ICD10-I70.0). 6. Ventral abdominal wall laxity containing transverse colon, similar.   08/2018 Imaging   CT: IMPRESSION: 1. Nodules within the sigmoid mesocolon have decreased in size compared to prior. No new peritoneal or omental nodularity. 2. No evidence local recurrence the pelvis. 3.  Ventral hernia contains a segment of transverse colon. No change from prior.     06/19/2019 Imaging   1. No evidence of metastatic disease in the abdomen pelvis. 2. No peritoneal or omental metastasis identified.  No free fluid. 3. Postcholecystectomy and hysterectomy. Comparison exams are made available. Comparison CT 09/08/2018 and 05/27/2018. PET-CT 05/27/2018    There is a nodule within the proximal aspect of the sigmoid colon measuring 2.2 by 2.2 cm. In comparison to prior CTs and PET-CT there was a hypermetabolic nodule at this location on the PET-CT of 08/27/2017 and on the CT of 09/08/2018 there was a small residual nodule. At that time (09/08/2018) the nodule measured 1.4 by 1.3 cm. Therefore this nodule has increased in the interval and concerning for recurrence of a serosal implant. The previous described lymph nodes in the sigmoid mesocolon and more central mesentery are not increased in size and not pathologic by size criteria.    Concern for recurrence of serosal metastasis in the proximal sigmoid colon with a 2 cm enlarging lesion. Lesion extends into the lumen. No evidence of high-grade obstruction. Consider FDG PET scan and/or potential colonoscopy for evaluation.   06/29/2019 PET scan   1. Enlarging serosal implant within the proximal sigmoid colon has intense metabolic activity most consistent with malignancy. Lesion exhibits a somewhat indolent progression as present on PET-CT scan from 10/11/2017 and 05/27/2018. 2. Focal hypermetabolic activity within the RIGHT rectus muscle just off midline is slightly decreased from comparison exam. This may be benign  inflammation related to prior laparotomy, however malignancy not excluded.   07/26/2019 Procedure   She had colonoscopy which showed multiple polyps.  6 polyps were removed from the cecum, measuring 3 to 6 mm in size.  One 12 mm polyp was removed from ascending colon and another 1 measures 7 mm.  One 5 mm polyp was removed from  the transverse colon.  There is tumor noted in the sigmoid colon, approximately 35 cm from the anus which was biopsied.  The tumor appeared to be fungating, infiltrative and ulcerated but nonobstructive.  It encompassed approximately one third of the circumference of the lumen.   07/26/2019 Pathology Results   Multiple polyps came back tubular adenomas.  2 ascending polyps came back sessile serrated adenoma months.  Sigmoid colon biopsy confirmed metastatic high-grade serous carcinoma.  The morphology and immunophenotype are consistent with metastatic high-grade serous carcinoma from tubal/ovarian primary site.   08/17/2019 - 12/12/2019 Chemotherapy   The patient had carboplatin and taxol   10/23/2019 Imaging   1. Sigmoid lesion with diminished size/conspicuity difficult to measure on the previous exam. Adjacent lymph nodes and nodularity less than a cm, largest on coronal image 48 measuring 5 mm. 2. Right rectus muscle with some thickening with mildly convex margin seen posteriorly on image 72 of series 2. This could be due to postsurgical change, however, metastatic disease is not excluded. Attention on follow-up is suggested. 3. No evidence for new metastatic disease. 4. Small hiatal hernia. 5. 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   Aortic Atherosclerosis (ICD10-I70.0).   01/01/2020 Imaging   1. No signs of new disease. 2. Tiny lymph nodes in the area of concern in the LEFT lower quadrant z. 3. Added density and subtle contour irregularity involving the rectus muscles best seen on sagittal images, associated with site of prior surgical incision just to the RIGHT of midline (image 72, series 2 and image 58, series 5. Not significantly changed compared to prior studies. This measures approximately 2.1 x 1 cm in the sagittal plane and is less well-defined in the axial plane. Potentially postoperative change. Attention on follow-up. 4. Aortic atherosclerosis.   Aortic Atherosclerosis  (ICD10-I70.0).       01/29/2020 -  Chemotherapy    Patient is on Treatment Plan: OVARIAN BEVACIZUMAB       05/06/2020 Imaging   1. Status post hysterectomy and oophorectomy. 2. No specific findings of recurrent or metastatic disease in the abdomen or pelvis. 3. No change in postoperative appearance of low midline incision. 4. Unchanged small prominent subcentimeter left iliac lymph nodes.   10/22/2020 Imaging   1. Status post hysterectomy and oophorectomy. No evidence of recurrent or metastatic disease within the abdomen or pelvis. 2. No significant change in the subcentimeter prominent left iliac lymph nodes.     REVIEW OF SYSTEMS:   Constitutional: Denies fevers, chills or abnormal weight loss Eyes: Denies blurriness of vision Ears, nose, mouth, throat, and face: Denies mucositis or sore throat Respiratory: Denies cough, dyspnea or wheezes Cardiovascular: Denies palpitation, chest discomfort or lower extremity swelling Skin: Denies abnormal skin rashes Lymphatics: Denies new lymphadenopathy or easy bruising Neurological:Denies numbness, tingling or new weaknesses Behavioral/Psych: Mood is stable, no new changes  All other systems were reviewed with the patient and are negative.  I have reviewed the past medical history, past surgical history, social history and family history with the patient and they are unchanged from previous note.  ALLERGIES:  is allergic to [employer] tartrate] and cortisone.  MEDICATIONS:  Current Outpatient Medications  Medication Sig Dispense Refill   amLODipine (NORVASC) 10 MG tablet Take 1 tablet (10 mg total) by mouth daily. 30 tablet 11   Armodafinil 250 MG tablet Take 1 tablet (250 mg total) by mouth every morning. 90 tablet 1   bevacizumab in sodium chloride 0.9 % 100 mL Inject into the vein once.     busPIRone (BUSPAR) 7.5 MG tablet Take 15 mg by mouth 2 (two) times daily.     chlorthalidone (HYGROTON) 25 MG tablet Take 25 mg by mouth  daily.     cholecalciferol (VITAMIN D3) 25 MCG (1000 UT) tablet Take 1,000 Units by mouth daily.     colestipol (COLESTID) 1 g tablet Take 0.5-1 g by mouth daily. Pt taking 1/2 to 1 tablet daily depending on meal choice; taken 1 hour after morning meds , and can not eat for another hour     eszopiclone (LUNESTA) 2 MG TABS tablet Take immediately before bedtime 90 tablet 1   FLUoxetine (PROZAC) 20 MG capsule Take 40 mg by mouth every morning. Takes with a 82m capsule for her total dose of 317m     lidocaine-prilocaine (EMLA) cream Apply 1 application topically as needed. 30 g 1   MELATONIN PO Take 10 mg by mouth at bedtime.      metFORMIN (GLUCOPHAGE-XR) 500 MG 24 hr tablet Take 500 mg by mouth daily with breakfast.   3   metoprolol tartrate (LOPRESSOR) 25 MG tablet Take 1 tablet (25 mg total) by mouth 2 (two) times daily. 60 tablet 3   Multiple Vitamin (MULTIVITAMIN WITH MINERALS) TABS Take 1 tablet by mouth every morning.      rosuvastatin (CRESTOR) 20 MG tablet Take 20 mg by mouth every morning.      telmisartan (MICARDIS) 40 MG tablet Take 40 mg by mouth daily.     No current facility-administered medications for this visit.   Facility-Administered Medications Ordered in Other Visits  Medication Dose Route Frequency Provider Last Rate Last Admin   sodium chloride flush (NS) 0.9 % injection 10 mL  10 mL Intravenous PRN GeNancy MarusMD   10 mL at 05/12/17 [id number]  PHYSICAL EXAMINATION: ECOG PERFORMANCE STATUS: 1 - Symptomatic but completely ambulatory  Vitals:   02/27/21 0913  BP: (!) 148/80  Pulse: 69  Resp: 18  Temp: 98.1 F (36.7 C)  SpO2: 100%   Filed Weights   02/27/21 0913  Weight: 198 lb (89.8 kg)    GENERAL:alert, no distress and comfortable SKIN: skin color, texture, turgor are normal, no rashes or significant lesions EYES: normal, Conjunctiva are pink and non-injected, sclera clear OROPHARYNX:no exudate, no erythema and lips, buccal mucosa, and tongue normal   NECK: supple, thyroid normal size, non-tender, without nodularity LYMPH:  no palpable lymphadenopathy in the cervical, axillary or inguinal LUNGS: clear to auscultation and percussion with normal breathing effort HEART: regular rate & rhythm and no murmurs and no lower extremity edema ABDOMEN:abdomen soft, non-tender and normal bowel sounds Musculoskeletal:no cyanosis of digits and no clubbing  NEURO: alert & oriented x 3 with fluent speech, no focal motor/sensory deficits  LABORATORY DATA:  I have reviewed the data as listed    Component Value Date/Time   NA 139 02/05/2021 0801   NA 141 12/02/2016 1219   K 4.2 02/05/2021 0801   K 3.7 12/02/2016 1219   CL 103 02/05/2021 0801   CL 103 01/18/2013 1329   CO2 27 02/05/2021 0801   CO2  27 12/02/2016 1219   GLUCOSE 116 (H) 02/05/2021 0801   GLUCOSE 98 12/02/2016 1219   GLUCOSE 110 (H) 01/18/2013 1329   BUN 16 02/05/2021 0801   BUN 15.3 12/02/2016 1219   CREATININE 0.81 02/05/2021 0801   CREATININE 0.73 07/30/2020 0716   CREATININE 0.7 12/02/2016 1219   CALCIUM 9.6 02/05/2021 0801   CALCIUM 10.1 12/02/2016 1219   PROT 7.4 02/05/2021 0801   PROT 7.1 12/02/2016 1219   ALBUMIN 4.2 02/05/2021 0801   ALBUMIN 4.2 12/02/2016 1219   AST 28 02/05/2021 0801   AST 29 07/30/2020 0716   AST 21 12/02/2016 1219   ALT 39 02/05/2021 0801   ALT 37 07/30/2020 0716   ALT 27 12/02/2016 1219   ALKPHOS 88 02/05/2021 0801   ALKPHOS 104 12/02/2016 1219   BILITOT 0.5 02/05/2021 0801   BILITOT 0.5 07/30/2020 0716   BILITOT 0.37 12/02/2016 1219   GFRNONAA >60 02/05/2021 0801   GFRNONAA >60 07/30/2020 0716   GFRAA >60 05/07/2020 0833    No results found for: SPEP, UPEP  Lab Results  Component Value Date   WBC 5.1 02/27/2021   NEUTROABS 2.9 02/27/2021   HGB 13.4 02/27/2021   HCT 39.8 02/27/2021   MCV 90.2 02/27/2021   PLT 141 (L) 02/27/2021      Chemistry      Component Value Date/Time   NA 139 02/05/2021 0801   NA 141 12/02/2016 1219    K 4.2 02/05/2021 0801   K 3.7 12/02/2016 1219   CL 103 02/05/2021 0801   CL 103 01/18/2013 1329   CO2 27 02/05/2021 0801   CO2 27 12/02/2016 1219   BUN 16 02/05/2021 0801   BUN 15.3 12/02/2016 1219   CREATININE 0.81 02/05/2021 0801   CREATININE 0.73 07/30/2020 0716   CREATININE 0.7 12/02/2016 1219   GLU 236 12/13/2012 1558      Component Value Date/Time   CALCIUM 9.6 02/05/2021 0801   CALCIUM 10.1 12/02/2016 1219   ALKPHOS 88 02/05/2021 0801   ALKPHOS 104 12/02/2016 1219   AST 28 02/05/2021 0801   AST 29 07/30/2020 0716   AST 21 12/02/2016 1219   ALT 39 02/05/2021 0801   ALT 37 07/30/2020 0716   ALT 27 12/02/2016 1219   BILITOT 0.5 02/05/2021 0801   BILITOT 0.5 07/30/2020 0716   BILITOT 0.37 12/02/2016 1219

## 2021-02-27 NOTE — Assessment & Plan Note (Signed)
Her blood pressure is well controlled She will continue close monitoring

## 2021-02-27 NOTE — Assessment & Plan Note (Signed)
Her thrombocytopenia is stable Observe only for now 

## 2021-02-27 NOTE — Assessment & Plan Note (Signed)
Overall, she tolerated treatment well Her documented blood pressure from home were within normal range She has intermittent nausea and chronic loose stool which is unchanged Recommend follow-up with GI service I plan to order CT imaging first week of September for further follow-up

## 2021-03-06 DIAGNOSIS — E782 Mixed hyperlipidemia: Secondary | ICD-10-CM | POA: Diagnosis not present

## 2021-03-06 DIAGNOSIS — F411 Generalized anxiety disorder: Secondary | ICD-10-CM | POA: Diagnosis not present

## 2021-03-06 DIAGNOSIS — E1159 Type 2 diabetes mellitus with other circulatory complications: Secondary | ICD-10-CM | POA: Diagnosis not present

## 2021-03-06 DIAGNOSIS — R197 Diarrhea, unspecified: Secondary | ICD-10-CM | POA: Diagnosis not present

## 2021-03-06 DIAGNOSIS — G4733 Obstructive sleep apnea (adult) (pediatric): Secondary | ICD-10-CM | POA: Diagnosis not present

## 2021-03-06 DIAGNOSIS — I10 Essential (primary) hypertension: Secondary | ICD-10-CM | POA: Diagnosis not present

## 2021-03-06 DIAGNOSIS — G47419 Narcolepsy without cataplexy: Secondary | ICD-10-CM | POA: Diagnosis not present

## 2021-03-06 DIAGNOSIS — M797 Fibromyalgia: Secondary | ICD-10-CM | POA: Diagnosis not present

## 2021-03-07 DIAGNOSIS — E559 Vitamin D deficiency, unspecified: Secondary | ICD-10-CM | POA: Diagnosis not present

## 2021-03-07 DIAGNOSIS — I10 Essential (primary) hypertension: Secondary | ICD-10-CM | POA: Diagnosis not present

## 2021-03-07 DIAGNOSIS — E1159 Type 2 diabetes mellitus with other circulatory complications: Secondary | ICD-10-CM | POA: Diagnosis not present

## 2021-03-07 DIAGNOSIS — G47419 Narcolepsy without cataplexy: Secondary | ICD-10-CM | POA: Diagnosis not present

## 2021-03-07 DIAGNOSIS — Z7984 Long term (current) use of oral hypoglycemic drugs: Secondary | ICD-10-CM | POA: Diagnosis not present

## 2021-03-07 DIAGNOSIS — M797 Fibromyalgia: Secondary | ICD-10-CM | POA: Diagnosis not present

## 2021-03-07 DIAGNOSIS — F411 Generalized anxiety disorder: Secondary | ICD-10-CM | POA: Diagnosis not present

## 2021-03-07 DIAGNOSIS — E1165 Type 2 diabetes mellitus with hyperglycemia: Secondary | ICD-10-CM | POA: Diagnosis not present

## 2021-03-07 DIAGNOSIS — R197 Diarrhea, unspecified: Secondary | ICD-10-CM | POA: Diagnosis not present

## 2021-03-07 DIAGNOSIS — E782 Mixed hyperlipidemia: Secondary | ICD-10-CM | POA: Diagnosis not present

## 2021-03-07 DIAGNOSIS — C5700 Malignant neoplasm of unspecified fallopian tube: Secondary | ICD-10-CM | POA: Diagnosis not present

## 2021-03-19 ENCOUNTER — Telehealth: Payer: Self-pay | Admitting: Family Medicine

## 2021-03-19 NOTE — Telephone Encounter (Signed)
I called Melissa back she states the Armodafinil is on back order until 04/07/2021. I advised the problem with changing over to the Modafinil would be a PA insurance auth since one was required for the Armodafinil/turn around time for approval.  Lenna Sciara states she will discuss with Cyril Mourning (pharm tech) and get back with [place] on what else might be needed for this.

## 2021-03-19 NOTE — Telephone Encounter (Signed)
Melissa @ [facility] states a pharmacy tech informed her  pt's Armodafinil 250 MG tablet is on b/o pt is expected to get delivery on tomorrow.  Kristin Webb is asking if they can send  Modafinil 200 mg in its place.  If they are called in the morning they will still be able to get the medication to the pt on tomorrow.  Please call Melissa at [phone] xt [phone]

## 2021-03-20 ENCOUNTER — Other Ambulatory Visit: Payer: Self-pay

## 2021-03-20 ENCOUNTER — Telehealth: Payer: Self-pay | Admitting: Neurology

## 2021-03-20 ENCOUNTER — Other Ambulatory Visit: Payer: Self-pay | Admitting: Neurology

## 2021-03-20 ENCOUNTER — Inpatient Hospital Stay: Payer: Medicare HMO | Attending: Gynecologic Oncology

## 2021-03-20 ENCOUNTER — Inpatient Hospital Stay: Payer: Medicare HMO

## 2021-03-20 VITALS — BP 157/85 | HR 70 | Temp 98.0°F | Resp 18

## 2021-03-20 DIAGNOSIS — Z5112 Encounter for antineoplastic immunotherapy: Secondary | ICD-10-CM | POA: Insufficient documentation

## 2021-03-20 DIAGNOSIS — C5701 Malignant neoplasm of right fallopian tube: Secondary | ICD-10-CM

## 2021-03-20 DIAGNOSIS — M159 Polyosteoarthritis, unspecified: Secondary | ICD-10-CM | POA: Diagnosis not present

## 2021-03-20 DIAGNOSIS — C5700 Malignant neoplasm of unspecified fallopian tube: Secondary | ICD-10-CM

## 2021-03-20 DIAGNOSIS — Z7189 Other specified counseling: Secondary | ICD-10-CM

## 2021-03-20 DIAGNOSIS — E1159 Type 2 diabetes mellitus with other circulatory complications: Secondary | ICD-10-CM | POA: Diagnosis not present

## 2021-03-20 DIAGNOSIS — E782 Mixed hyperlipidemia: Secondary | ICD-10-CM | POA: Diagnosis not present

## 2021-03-20 DIAGNOSIS — I10 Essential (primary) hypertension: Secondary | ICD-10-CM | POA: Diagnosis not present

## 2021-03-20 LAB — CBC WITH DIFFERENTIAL/PLATELET
Abs Immature Granulocytes: 0.01 10*3/uL (ref 0.00–0.07)
Basophils Absolute: 0 10*3/uL (ref 0.0–0.1)
Basophils Relative: 0 %
Eosinophils Absolute: 0.1 10*3/uL (ref 0.0–0.5)
Eosinophils Relative: 1 %
HCT: 40.9 % (ref 36.0–46.0)
Hemoglobin: 13.6 g/dL (ref 12.0–15.0)
Immature Granulocytes: 0 %
Lymphocytes Relative: 33 %
Lymphs Abs: 1.8 10*3/uL (ref 0.7–4.0)
MCH: 30.6 pg (ref 26.0–34.0)
MCHC: 33.3 g/dL (ref 30.0–36.0)
MCV: 92.1 fL (ref 80.0–100.0)
Monocytes Absolute: 0.5 10*3/uL (ref 0.1–1.0)
Monocytes Relative: 9 %
Neutro Abs: 3.1 10*3/uL (ref 1.7–7.7)
Neutrophils Relative %: 57 %
Platelets: 126 10*3/uL — ABNORMAL LOW (ref 150–400)
RBC: 4.44 MIL/uL (ref 3.87–5.11)
RDW: 13.2 % (ref 11.5–15.5)
WBC: 5.6 10*3/uL (ref 4.0–10.5)
nRBC: 0 % (ref 0.0–0.2)

## 2021-03-20 LAB — COMPREHENSIVE METABOLIC PANEL
ALT: 38 U/L (ref 0–44)
AST: 27 U/L (ref 15–41)
Albumin: 4 g/dL (ref 3.5–5.0)
Alkaline Phosphatase: 87 U/L (ref 38–126)
Anion gap: 10 (ref 5–15)
BUN: 17 mg/dL (ref 8–23)
CO2: 26 mmol/L (ref 22–32)
Calcium: 9.5 mg/dL (ref 8.9–10.3)
Chloride: 106 mmol/L (ref 98–111)
Creatinine, Ser: 0.71 mg/dL (ref 0.44–1.00)
GFR, Estimated: >60 mL/min (ref >60)
Glucose, Bld: 108 mg/dL — ABNORMAL HIGH (ref 70–99)
Potassium: 4.1 mmol/L (ref 3.5–5.1)
Sodium: 142 mmol/L (ref 135–145)
Total Bilirubin: 0.5 mg/dL (ref 0.3–1.2)
Total Protein: 6.9 g/dL (ref 6.5–8.1)

## 2021-03-20 MED ORDER — SODIUM CHLORIDE 0.9 % IV SOLN
Freq: Once | INTRAVENOUS | Status: AC
  Filled 2021-03-20: qty 250

## 2021-03-20 MED ORDER — SODIUM CHLORIDE 0.9% FLUSH
10.0000 mL | INTRAVENOUS | Status: DC | PRN
Start: 2021-03-20 — End: 2021-03-20
  Administered 2021-03-20: 10 mL
  Filled 2021-03-20: qty 10

## 2021-03-20 MED ORDER — HEPARIN SOD (PORK) LOCK FLUSH 100 UNIT/ML IV SOLN
500.0000 [IU] | Freq: Once | INTRAVENOUS | Status: AC | PRN
  Administered 2021-03-20: 500 [IU]
  Filled 2021-03-20: qty 5

## 2021-03-20 MED ORDER — SODIUM CHLORIDE 0.9 % IV SOLN
15.0000 mg/kg | Freq: Once | INTRAVENOUS | Status: AC
  Administered 2021-03-20: 1300 mg via INTRAVENOUS
  Filled 2021-03-20: qty 48

## 2021-03-20 MED ORDER — MODAFINIL 200 MG PO TABS: 200.0000 mg | ORAL_TABLET | Freq: Every day | ORAL | 0 refills | Status: DC

## 2021-03-20 NOTE — Telephone Encounter (Signed)
PA submitted through [employer]/ [employer] [id number] States that auth for Modafinil is on file

## 2021-03-20 NOTE — Patient Instructions (Signed)
[facility]   °Discharge Instructions: °Thank you for choosing [facility] to provide your oncology and hematology care.  ° °If you have a lab appointment with the Cancer Center, please go directly to the Cancer Center and check in at the registration area. °  °Wear comfortable clothing and clothing appropriate for easy access to any Portacath or PICC line.  ° °We strive to give you quality time with your provider. You may need to reschedule your appointment if you arrive late (15 or more minutes).  Arriving late affects you and other patients whose appointments are after yours.  Also, if you miss three or more appointments without notifying the office, you may be dismissed from the clinic at the provider’s discretion.    °  °For prescription refill requests, have your pharmacy contact our office and allow 72 hours for refills to be completed.   ° °Today you received the following chemotherapy and/or immunotherapy agents: avastin  °  °To help prevent nausea and vomiting after your treatment, we encourage you to take your nausea medication as directed. ° °BELOW ARE SYMPTOMS THAT SHOULD BE REPORTED IMMEDIATELY: °*FEVER GREATER THAN 100.4 F (38 °C) OR HIGHER °*CHILLS OR SWEATING °*NAUSEA AND VOMITING THAT IS NOT CONTROLLED WITH YOUR NAUSEA MEDICATION °*UNUSUAL SHORTNESS OF BREATH °*UNUSUAL BRUISING OR BLEEDING °*URINARY PROBLEMS (pain or burning when urinating, or frequent urination) °*BOWEL PROBLEMS (unusual diarrhea, constipation, pain near the anus) °TENDERNESS IN MOUTH AND THROAT WITH OR WITHOUT PRESENCE OF ULCERS (sore throat, sores in mouth, or a toothache) °UNUSUAL RASH, SWELLING OR PAIN  °UNUSUAL VAGINAL DISCHARGE OR ITCHING  ° °Items with * indicate a potential emergency and should be followed up as soon as possible or go to the Emergency Department if any problems should occur. ° °Please show the CHEMOTHERAPY ALERT CARD or IMMUNOTHERAPY ALERT CARD at check-in to  the Emergency Department and triage nurse. ° °Should you have questions after your visit or need to cancel or reschedule your appointment, please contact [facility]  Dept: [phone]  and follow the prompts.  Office hours are 8:00 a.m. to 4:30 p.m. Monday - Friday. Please note that voicemails left after 4:00 p.m. may not be returned until the following business day.  We are closed weekends and major holidays. You have access to a nurse at all times for urgent questions. Please call the main number to the clinic Dept: [phone] and follow the prompts. ° ° °For any non-urgent questions, you may also contact your provider using MyChart. We now offer e-Visits for anyone 18 and older to request care online for non-urgent symptoms. For details visit [email or web address]. °  °Also download the MyChart app! Go to the app store, search "MyChart", open the app, select [facility], and log in with your MyChart username and password. ° °Due to Covid, a mask is required upon entering the hospital/clinic. If you do not have a mask, one will be given to you upon arrival. For doctor visits, patients may have 1 support person aged 18 or older with them. For treatment visits, patients cannot have anyone with them due to current Covid guidelines and our immunocompromised population.  ° °

## 2021-03-20 NOTE — Patient Instructions (Signed)

## 2021-03-20 NOTE — Telephone Encounter (Signed)
Spoke with Dr Brett Fairy and she ok'd 1 month of the modafinil for the patient while waiting for Armodafinil to come in. I have forwarded updates script for the patient. Most likely it will require a PA will attempt this.

## 2021-03-25 DIAGNOSIS — G4733 Obstructive sleep apnea (adult) (pediatric): Secondary | ICD-10-CM | POA: Diagnosis not present

## 2021-04-10 ENCOUNTER — Inpatient Hospital Stay: Payer: Medicare HMO | Attending: Gynecologic Oncology

## 2021-04-10 ENCOUNTER — Other Ambulatory Visit: Payer: Self-pay

## 2021-04-10 ENCOUNTER — Ambulatory Visit (HOSPITAL_COMMUNITY)
Admission: RE | Admit: 2021-04-10 | Discharge: 2021-04-10 | Disposition: A | Discharge status: Home / Self Care | Payer: Medicare HMO | Source: Ambulatory Visit | Attending: Hematology and Oncology | Admitting: Hematology and Oncology

## 2021-04-10 DIAGNOSIS — I2510 Atherosclerotic heart disease of native coronary artery without angina pectoris: Secondary | ICD-10-CM | POA: Diagnosis not present

## 2021-04-10 DIAGNOSIS — C5701 Malignant neoplasm of right fallopian tube: Secondary | ICD-10-CM | POA: Diagnosis not present

## 2021-04-10 DIAGNOSIS — C5700 Malignant neoplasm of unspecified fallopian tube: Secondary | ICD-10-CM | POA: Diagnosis present

## 2021-04-10 DIAGNOSIS — Z5112 Encounter for antineoplastic immunotherapy: Secondary | ICD-10-CM | POA: Insufficient documentation

## 2021-04-10 DIAGNOSIS — Z90710 Acquired absence of both cervix and uterus: Secondary | ICD-10-CM | POA: Diagnosis not present

## 2021-04-10 DIAGNOSIS — Z9049 Acquired absence of other specified parts of digestive tract: Secondary | ICD-10-CM | POA: Diagnosis not present

## 2021-04-10 DIAGNOSIS — Z08 Encounter for follow-up examination after completed treatment for malignant neoplasm: Secondary | ICD-10-CM | POA: Diagnosis not present

## 2021-04-10 DIAGNOSIS — I700 Atherosclerosis of aorta: Secondary | ICD-10-CM | POA: Insufficient documentation

## 2021-04-10 DIAGNOSIS — Z8543 Personal history of malignant neoplasm of ovary: Secondary | ICD-10-CM | POA: Diagnosis not present

## 2021-04-10 DIAGNOSIS — Z90722 Acquired absence of ovaries, bilateral: Secondary | ICD-10-CM | POA: Insufficient documentation

## 2021-04-10 LAB — CBC WITH DIFFERENTIAL/PLATELET
Abs Immature Granulocytes: 0.01 10*3/uL (ref 0.00–0.07)
Basophils Absolute: 0 10*3/uL (ref 0.0–0.1)
Basophils Relative: 0 %
Eosinophils Absolute: 0.1 10*3/uL (ref 0.0–0.5)
Eosinophils Relative: 2 %
HCT: 39.9 % (ref 36.0–46.0)
Hemoglobin: 13.3 g/dL (ref 12.0–15.0)
Immature Granulocytes: 0 %
Lymphocytes Relative: 35 %
Lymphs Abs: 1.6 10*3/uL (ref 0.7–4.0)
MCH: 30.2 pg (ref 26.0–34.0)
MCHC: 33.3 g/dL (ref 30.0–36.0)
MCV: 90.7 fL (ref 80.0–100.0)
Monocytes Absolute: 0.5 10*3/uL (ref 0.1–1.0)
Monocytes Relative: 10 %
Neutro Abs: 2.4 10*3/uL (ref 1.7–7.7)
Neutrophils Relative %: 53 %
Platelets: 111 10*3/uL — ABNORMAL LOW (ref 150–400)
RBC: 4.4 MIL/uL (ref 3.87–5.11)
RDW: 13.5 % (ref 11.5–15.5)
WBC: 4.6 10*3/uL (ref 4.0–10.5)
nRBC: 0 % (ref 0.0–0.2)

## 2021-04-10 LAB — COMPREHENSIVE METABOLIC PANEL
ALT: 35 U/L (ref 0–44)
AST: 31 U/L (ref 15–41)
Albumin: 4 g/dL (ref 3.5–5.0)
Alkaline Phosphatase: 70 U/L (ref 38–126)
Anion gap: 11 (ref 5–15)
BUN: 15 mg/dL (ref 8–23)
CO2: 26 mmol/L (ref 22–32)
Calcium: 9.1 mg/dL (ref 8.9–10.3)
Chloride: 105 mmol/L (ref 98–111)
Creatinine, Ser: 0.75 mg/dL (ref 0.44–1.00)
GFR, Estimated: >60 mL/min (ref >60)
Glucose, Bld: 107 mg/dL — ABNORMAL HIGH (ref 70–99)
Potassium: 4 mmol/L (ref 3.5–5.1)
Sodium: 142 mmol/L (ref 135–145)
Total Bilirubin: 0.6 mg/dL (ref 0.3–1.2)
Total Protein: 6.8 g/dL (ref 6.5–8.1)

## 2021-04-10 MED ORDER — IOHEXOL 9 MG/ML PO SOLN
ORAL | Status: AC
  Filled 2021-04-10: qty 1000

## 2021-04-10 MED ORDER — SODIUM CHLORIDE 0.9% FLUSH
10.0000 mL | Freq: Once | INTRAVENOUS | Status: AC
  Administered 2021-04-10: 10 mL

## 2021-04-10 MED ORDER — IOHEXOL 9 MG/ML PO SOLN: 1000.0000 mL | ORAL | Status: AC

## 2021-04-10 MED ORDER — IOHEXOL 350 MG/ML SOLN
80.0000 mL | Freq: Once | INTRAVENOUS | Status: AC | PRN
  Administered 2021-04-10: 80 mL via INTRAVENOUS

## 2021-04-10 NOTE — Progress Notes (Signed)
OK to leave port accessed overnight for chemotherapy tomorrow per Dr. Alvy Bimler.  Biopatch in place. Gardiner Rhyme, RN

## 2021-04-10 NOTE — Progress Notes (Signed)
Cammie Sickle with the cancer center approved for the patient to leave her port in for her chemo appointment for tomorrow (04/11/2021),  she is to place the order in the patient's chart.

## 2021-04-11 ENCOUNTER — Encounter: Payer: Self-pay | Admitting: Hematology and Oncology

## 2021-04-11 ENCOUNTER — Other Ambulatory Visit: Payer: Self-pay | Admitting: Hematology and Oncology

## 2021-04-11 ENCOUNTER — Inpatient Hospital Stay (HOSPITAL_BASED_OUTPATIENT_CLINIC_OR_DEPARTMENT_OTHER): Payer: Medicare HMO | Admitting: Hematology and Oncology

## 2021-04-11 ENCOUNTER — Inpatient Hospital Stay: Payer: Medicare HMO

## 2021-04-11 VITALS — BP 138/70 | HR 60

## 2021-04-11 DIAGNOSIS — I2510 Atherosclerotic heart disease of native coronary artery without angina pectoris: Secondary | ICD-10-CM | POA: Insufficient documentation

## 2021-04-11 DIAGNOSIS — C5700 Malignant neoplasm of unspecified fallopian tube: Secondary | ICD-10-CM

## 2021-04-11 DIAGNOSIS — Z7189 Other specified counseling: Secondary | ICD-10-CM

## 2021-04-11 DIAGNOSIS — C5701 Malignant neoplasm of right fallopian tube: Secondary | ICD-10-CM | POA: Diagnosis not present

## 2021-04-11 DIAGNOSIS — Z5112 Encounter for antineoplastic immunotherapy: Secondary | ICD-10-CM | POA: Diagnosis not present

## 2021-04-11 MED ORDER — SODIUM CHLORIDE 0.9 % IV SOLN
15.0000 mg/kg | Freq: Once | INTRAVENOUS | Status: AC
  Administered 2021-04-11: 1300 mg via INTRAVENOUS
  Filled 2021-04-11: qty 48

## 2021-04-11 MED ORDER — HEPARIN SOD (PORK) LOCK FLUSH 100 UNIT/ML IV SOLN
500.0000 [IU] | Freq: Once | INTRAVENOUS | Status: AC | PRN
  Administered 2021-04-11: 500 [IU]

## 2021-04-11 MED ORDER — SODIUM CHLORIDE 0.9 % IV SOLN: Freq: Once | INTRAVENOUS | Status: AC

## 2021-04-11 MED ORDER — SODIUM CHLORIDE 0.9% FLUSH
10.0000 mL | INTRAVENOUS | Status: DC | PRN
  Administered 2021-04-11: 10 mL

## 2021-04-11 NOTE — Patient Instructions (Signed)
[facility]  Discharge Instructions: Thank you for choosing [facility] to provide your oncology and hematology care.   If you have a lab appointment with the [facility], please go directly to the [facility] and check in at the registration area.   Wear comfortable clothing and clothing appropriate for easy access to any Portacath or PICC line.   We strive to give you quality time with your provider. You may need to reschedule your appointment if you arrive late (15 or more minutes).  Arriving late affects you and other patients whose appointments are after yours.  Also, if you miss three or more appointments without notifying the office, you may be dismissed from the clinic at the provider's discretion.      For prescription refill requests, have your pharmacy contact our office and allow 72 hours for refills to be completed.    Today you received the following chemotherapy and/or immunotherapy agents Zirabev      To help prevent nausea and vomiting after your treatment, we encourage you to take your nausea medication as directed.  BELOW ARE SYMPTOMS THAT SHOULD BE REPORTED IMMEDIATELY: *FEVER GREATER THAN 100.4 F (38 C) OR HIGHER *CHILLS OR SWEATING *NAUSEA AND VOMITING THAT IS NOT CONTROLLED WITH YOUR NAUSEA MEDICATION *UNUSUAL SHORTNESS OF BREATH *UNUSUAL BRUISING OR BLEEDING *URINARY PROBLEMS (pain or burning when urinating, or frequent urination) *BOWEL PROBLEMS (unusual diarrhea, constipation, pain near the anus) TENDERNESS IN MOUTH AND THROAT WITH OR WITHOUT PRESENCE OF ULCERS (sore throat, sores in mouth, or a toothache) UNUSUAL RASH, SWELLING OR PAIN  UNUSUAL VAGINAL DISCHARGE OR ITCHING   Items with * indicate a potential emergency and should be followed up as soon as possible or go to the Emergency Department if any problems should occur.  Please show the CHEMOTHERAPY ALERT CARD or IMMUNOTHERAPY ALERT CARD at check-in to the  Emergency Department and triage nurse.  Should you have questions after your visit or need to cancel or reschedule your appointment, please contact [facility]  Dept: [phone]  and follow the prompts.  Office hours are 8:00 a.m. to 4:30 p.m. Monday - Friday. Please note that voicemails left after 4:00 p.m. may not be returned until the following business day.  We are closed weekends and major holidays. You have access to a nurse at all times for urgent questions. Please call the main number to the clinic Dept: [phone] and follow the prompts.   For any non-urgent questions, you may also contact your provider using MyChart. We now offer e-Visits for anyone 55 and older to request care online for non-urgent symptoms. For details visit [email or web address].   Also download the MyChart app! Go to the app store, search "MyChart", open the app, select [facility], and log in with your MyChart username and password.  Due to Covid, a mask is required upon entering the hospital/clinic. If you do not have a mask, one will be given to you upon arrival. For doctor visits, patients may have 1 support person aged 48 or older with them. For treatment visits, patients cannot have anyone with them due to current Covid guidelines and our immunocompromised population.   Bevacizumab injection What is this medication? BEVACIZUMAB (be va SIZ yoo mab) is a monoclonal antibody. It is used to treat many types of cancer. This medicine may be used for other purposes; ask your health care provider or pharmacist if you have questions. COMMON BRAND NAME(S): Avastin, MVASI,  Noah Charon What should I tell my care team before I take this medication? They need to know if you have any of these conditions: diabetes heart disease high blood pressure history of coughing up blood prior anthracycline chemotherapy (e.g., doxorubicin, daunorubicin, epirubicin) recent or ongoing radiation  therapy recent or planning to have surgery stroke an unusual or allergic reaction to bevacizumab, hamster proteins, mouse proteins, other medicines, foods, dyes, or preservatives pregnant or trying to get pregnant breast-feeding How should I use this medication? This medicine is for infusion into a vein. It is given by a health care professional in a hospital or clinic setting. Talk to your pediatrician regarding the use of this medicine in children. Special care may be needed. Overdosage: If you think you have taken too much of this medicine contact a poison control center or emergency room at once. NOTE: This medicine is only for you. Do not share this medicine with others. What if I miss a dose? It is important not to miss your dose. Call your doctor or health care professional if you are unable to keep an appointment. What may interact with this medication? Interactions are not expected. This list may not describe all possible interactions. Give your health care provider a list of all the medicines, herbs, non-prescription drugs, or dietary supplements you use. Also tell them if you smoke, drink alcohol, or use illegal drugs. Some items may interact with your medicine. What should I watch for while using this medication? Your condition will be monitored carefully while you are receiving this medicine. You will need important blood work and urine testing done while you are taking this medicine. This medicine may increase your risk to bruise or bleed. Call your doctor or health care professional if you notice any unusual bleeding. Before having surgery, talk to your health care provider to make sure it is ok. This drug can increase the risk of poor healing of your surgical site or wound. You will need to stop this drug for 28 days before surgery. After surgery, wait at least 28 days before restarting this drug. Make sure the surgical site or wound is healed enough before restarting this drug.  Talk to your health care provider if questions. Do not become pregnant while taking this medicine or for 6 months after stopping it. Women should inform their doctor if they wish to become pregnant or think they might be pregnant. There is a potential for serious side effects to an unborn child. Talk to your health care professional or pharmacist for more information. Do not breast-feed an infant while taking this medicine and for 6 months after the last dose. This medicine has caused ovarian failure in some women. This medicine may interfere with the ability to have a child. You should talk to your doctor or health care professional if you are concerned about your fertility. What side effects may I notice from receiving this medication? Side effects that you should report to your doctor or health care professional as soon as possible: allergic reactions like skin rash, itching or hives, swelling of the face, lips, or tongue chest pain or chest tightness chills coughing up blood high fever seizures severe constipation signs and symptoms of bleeding such as bloody or black, tarry stools; red or dark-brown urine; spitting up blood or brown material that looks like coffee grounds; red spots on the skin; unusual bruising or bleeding from the eye, gums, or nose signs and symptoms of a blood clot such as breathing problems; chest  pain; severe, sudden headache; pain, swelling, warmth in the leg signs and symptoms of a stroke like changes in vision; confusion; trouble speaking or understanding; severe headaches; sudden numbness or weakness of the face, arm or leg; trouble walking; dizziness; loss of balance or coordination stomach pain sweating swelling of legs or ankles vomiting weight gain Side effects that usually do not require medical attention (report to your doctor or health care professional if they continue or are bothersome): back pain changes in taste decreased appetite dry  skin nausea tiredness This list may not describe all possible side effects. Call your doctor for medical advice about side effects. You may report side effects to FDA at 1-800-FDA-1088. Where should I keep my medication? This drug is given in a hospital or clinic and will not be stored at home. NOTE: This sheet is a summary. It may not cover all possible information. If you have questions about this medicine, talk to your doctor, pharmacist, or health care provider.  2022 [employer]/Gold Standard (2019-05-24 10:50:46)

## 2021-04-11 NOTE — Progress Notes (Signed)
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[facility]) I have reviewed CT imaging with the patient She has no signs of disease relapse or progression She tolerated bevacizumab well The plan will be continue indefinitely I plan to repeat imaging study again in March 2023  Coronary arteriosclerosis I have reviewed imaging study with the patient; she has signs of coronary atherosclerosis She has multiple cardiovascular risk factors and has significant class III obesity Recommend cardiology consult The patient would like to discuss this with her primary care doctor first She will continue lifestyle changes and risk factor modification  No orders of the defined types were placed in this encounter.   All questions were answered. The patient knows to call the clinic with any problems, questions or concerns. The total time spent in the appointment was 20 minutes encounter with patients including review of chart and various tests results, discussions about plan of care and coordination of care plan   [name], MD 04/11/2021 9:15 AM  INTERVAL HISTORY: Please see below for problem oriented charting. 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 She complained of dizziness, chronic gait instability and intermittent diarrhea She has appointment to see gastroenterologist in the near future Her documented blood pressure from home were within normal range  REVIEW OF SYSTEMS:   Constitutional: Denies fevers, chills or abnormal weight loss Eyes: Denies blurriness of vision Ears, nose, mouth, throat, and face: Denies mucositis or sore throat Respiratory: Denies cough, dyspnea or wheezes Cardiovascular: Denies palpitation, chest  discomfort or lower extremity swelling Gastrointestinal:  Denies nausea, heartburn or change in bowel habits Skin: Denies abnormal skin rashes Lymphatics: Denies new lymphadenopathy or easy bruising Neurological:Denies numbness, tingling or new weaknesses Behavioral/Psych: Mood is stable, no new changes  All other systems were reviewed with the patient and are negative.  I have reviewed the past medical history, past surgical history, social history and family history with the patient and they are unchanged from previous note.  ALLERGIES:  is allergic to [employer] tartrate] and cortisone.  MEDICATIONS:  Current Outpatient Medications  Medication Sig Dispense Refill   amLODipine (NORVASC) 10 MG tablet Take 1 tablet (10 mg total) by mouth daily. 30 tablet 11   Armodafinil 250 MG tablet Take 1 tablet (250 mg total) by mouth every morning. 90 tablet 1   bevacizumab in sodium chloride 0.9 % 100 mL Inject into the vein once.     busPIRone (BUSPAR) 7.5 MG tablet Take 15 mg by mouth 2 (two) times daily.     chlorthalidone (HYGROTON) 25 MG tablet Take 25 mg by mouth daily.     cholecalciferol (VITAMIN D3) 25 MCG (1000 UT) tablet Take 1,000 Units by mouth daily.     colestipol (COLESTID) 1 g tablet Take 0.5-1 g by mouth daily. Pt taking 1/2 to 1 tablet daily depending on meal choice; taken 1 hour after morning meds , and can not eat for another hour     eszopiclone (LUNESTA) 2 MG TABS tablet Take immediately before bedtime 90 tablet 1   FLUoxetine (PROZAC) 20 MG capsule Take 40 mg by mouth every morning. Takes with a 81m capsule for her total dose of 321m     lidocaine-prilocaine (EMLA) cream Apply 1 application topically as needed. 30 g 1  MELATONIN PO Take 10 mg by mouth at bedtime.      metFORMIN (GLUCOPHAGE-XR) 500 MG 24 hr tablet Take 500 mg by mouth daily with breakfast.   3   metoprolol tartrate (LOPRESSOR) 25 MG tablet Take 1 tablet (25 mg total) by mouth 2 (two) times daily. 60  tablet 3   modafinil (PROVIGIL) 200 MG tablet Take 1 tablet (200 mg total) by mouth daily. 30 tablet 0   Multiple Vitamin (MULTIVITAMIN WITH MINERALS) TABS Take 1 tablet by mouth every morning.      rosuvastatin (CRESTOR) 20 MG tablet Take 20 mg by mouth every morning.      telmisartan (MICARDIS) 40 MG tablet Take 40 mg by mouth daily.     No current facility-administered medications for this visit.   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  Interval development of large mid abdominal mass highly concerning for right ovarian cancer.  There is mild omental nodularity on the left, and peritoneal disease cannot be completely excluded.  There is no ascites or other evidence of metastatic disease. 2.  Mild associated renal pelvocaliectasis bilaterally without obstruction.  Nonobstructing left renal calculus and a small right renal angiomyolipoma noted incidentally.   11/08/2012 Pathology Results   1. 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 - HIGH GRADE SEROUS CARCINOMA, 1.5  CM, CENTERED IN FALLOPIAN TUBE FIMBRIA. - BENIGN FALLOPIAN TUBE WITH NONSPECIFIC CHRONIC INFLAMMATION. 2. Ovary and fallopian tube, left - BENIGN OVARY; NEGATIVE FOR ATYPIA OR MALIGNANCY. - BENIGN FALLOPIAN TUBE; NEGATIVE FOR ATYPIA OR MALIGNANCY. 3. Omentum, resection for tumor - HIGH GRADE CARCINOMA, SEE COMMENT. 4. Appendix, Other than Incidental - FIBROUS OBLITERATION OF APPENDICEAL TIP. - NEGATIVE FOR MALIGNANCY.   11/08/2012 Surgery   Surgery: Exploratory laparotomy, bilateral salpingo-oophorectomy, appendectomy, infacolic omentectomy, optimal debulking   Surgeons:  Paola A. Alycia Rossetti, MD; Lahoma Crocker, MD    Assistant: Caswell Corwin  Pathology: Bilateral fallopian tubes and ovaries to pathology. Appendix as well as omentum. 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   Operative findings: 25 cm right adnexal mass with smooth surface. Surgically absent uterus. Atrophic-appearing left ovary. Normal appearing appendix. Within the omentum there were centimeter nodules scattered throughout the omentum. The remainder of the surfaces were benign.   12/08/2012 Procedure   Impression:  Placement of a subcutaneous port device.  The catheter tip is in the lower SVC and ready to be used.     12/13/2012 - 03/28/2013 Chemotherapy   s/p 6 cycles of paclitaxel and carboplatin   12/13/2012 - 03/28/2013 Chemotherapy   The patient had 6 cycles of carboplatin and Taxol  03/24/2013 Imaging   US abdomen   04/24/2013 Imaging   CT abdomen Interval resection of the large right pelvic and lower abdominal mass lesion with apparent omentectomy.  No evidence for intraperitoneal free fluid on today's study.  No discernible peritoneal lesions.   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   09/07/2013 Genetic Testing   Patient has genetic testing done for BRCA1/2 panel Results revealed patient has no mutation(s):    07/09/2015 Imaging   CT abdomen 1. 12 mm obstructive calculus at the left ureteropelvic junction with moderate proximal hydronephrosis. 2. 2 small supraumbilical ventral hernias, one containing a short segment of the mid transverse colon and the other containing a short segment of the mid small bowel. There is no associated evidence to suggest bowel incarceration or obstruction at this time. 3. Tiny locule of gas non dependently in the lumen of the urinary bladder. This is presumably iatrogenic related to recent catheterization for urinalysis. Alternatively, this could be seen in the setting of urinary tract infection with gas-forming organisms. Clinical correlation for history of recent catheterization is recommended. 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 5. Status post cholecystectomy. 6. Additional incidental findings, as above.     12/11/2016 Imaging   Ct abdomen 1. No evidence of metastatic ovarian cancer. 2. Recurrent subxiphoid ventral abdominal wall hernia containing transverse colon. No evidence of incarceration or obstruction. 3. Stable incidental findings in the liver and kidneys. No recurrent urinary tract calculus. 4. Progressive lower lumbar spondylosis. 5.  Aortic Atherosclerosis (ICD10-I70.0).     08/30/2017 Imaging   MRI thoracic spine 1. At T5-6 there is a small central disc protrusion contacting the ventral thoracic spinal cord. No central canal or foraminal stenosis. 2. At T9-10 there is a small right paracentral disc protrusion. 3.  No acute osseous injury of the thoracic spine. 4. No aggressive osseous lesion to suggest metastatic disease.   09/24/2017 Tumor Marker   Patient's tumor was tested for the following markers: CA-125 Results of the tumor marker test revealed 21.7   09/30/2017 Imaging   CT abdomen 1. New small clustered soft tissue nodules in the left lower quadrant in the sigmoid mesentery, largest 1.0 cm, which could represent  recurrent peritoneal tumor implants. No ascites.  2. Midline high ventral abdominal wall hernia containing a portion of the transverse colon is mildly increased in size, and without bowel complication at this time. 3. Chronic findings include: Aortic Atherosclerosis (ICD10-I70.0). Diffuse hepatic steatosis. 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   10/11/2017 PET scan   1. Nodules in the sigmoid mesentery are hypermetabolic and highly worrisome for metastatic disease. 2. Attic steatosis.     11/03/2017 Procedure   Successful CT-guided rectus abdominal muscle mass core biopsy. Path: - FOREIGN BODY GIANT CELL REACTION INVOLVING FIBROADIPOSE TISSUE AND SKELETAL MUSCLE. - NO EVIDENCE OF MALIGNANCY.   11/04/2017 Cancer Staging   Staging form: Fallopian Tube, AJCC 7th Edition - Clinical: FIGO Stage IIIC, calculated as Stage IV (rT3, N0, M1) - Signed by [name], MD on 08/09/2019   02/2018 Imaging   CT: 1. Continued increase in size small peritoneal nodules along the mesenteric border of the proximal sigmoid colon as well as along the serosal surface of the proximal sigmoid colon. 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 2. No evidence of distant disease. 3. Stable large ventral hernia.   05/2018 Imaging   PET: 1. Redemonstration of hypermetabolic nodules within the  sigmoid mesocolon. Mild response to therapy relative to CT of 02/24/2018. Mixed response to therapy compared to the most recent PET of 10/11/2017. 2. Hypermetabolism within the right pelvic rectus musculature, increased since the prior PET.  Clinical service requested comparison to the 11/24/2017 CT. Index 10 mm nodule within the sigmoid mesocolon was similar to the 02/24/2018 CT, and as described on that exam, increased from 7 mm on 11/24/2017. More inferior nodule within the mesocolon measures 12 mm today on image 156/4 and 8 mm on  11/24/2017.   05/2018 Imaging   CT: IMPRESSION: 1. Since 02/24/2018, decreased size of peritoneal nodules centered in the sigmoid mesocolon. 2. No evidence of new or progressive disease. 3. Hepatic steatosis. 4. Subcentimeter right renal angiomyolipoma, similar. 5.  Aortic Atherosclerosis (ICD10-I70.0). 6. Ventral abdominal wall laxity containing transverse colon, similar.   08/2018 Imaging   CT: IMPRESSION: 1. Nodules within the sigmoid mesocolon have decreased in size compared to prior. No new peritoneal or omental nodularity. 2. No evidence local recurrence the pelvis. 3. Ventral hernia contains a segment of transverse colon. No change from prior.     06/19/2019 Imaging   1. No evidence of metastatic disease in the abdomen pelvis. 2. No peritoneal or omental metastasis identified.  No free fluid. 3. Postcholecystectomy and hysterectomy. Comparison exams are made available. Comparison CT 09/08/2018 and 05/27/2018. PET-CT 05/27/2018    There is a nodule within the proximal aspect of the sigmoid colon measuring 2.2 by 2.2 cm. In comparison to prior CTs and PET-CT there was a hypermetabolic nodule at this location on the PET-CT of 08/27/2017 and on the CT of 09/08/2018 there was a small residual nodule. At that time (09/08/2018) the nodule measured 1.4 by 1.3 cm. Therefore this nodule has increased in the interval and concerning for recurrence of a serosal implant. The previous described lymph nodes in the sigmoid mesocolon and more central mesentery are not increased in size and not pathologic by size criteria.    Concern for recurrence of serosal metastasis in the proximal sigmoid colon with a 2 cm enlarging lesion. Lesion extends into the lumen. No evidence of high-grade obstruction. Consider FDG PET scan and/or potential colonoscopy for evaluation.   06/29/2019 PET scan   1. Enlarging serosal implant within the proximal sigmoid colon has intense metabolic activity most consistent  with malignancy. Lesion exhibits a somewhat indolent progression as present on PET-CT scan from 10/11/2017 and 05/27/2018. 2. Focal hypermetabolic activity within the RIGHT rectus muscle just off midline is slightly decreased from comparison exam. This may be benign inflammation related to prior laparotomy, however malignancy not excluded.   07/26/2019 Procedure   She had colonoscopy which showed multiple polyps.  6 polyps were removed from the cecum, measuring 3 to 6 mm in size.  One 12 mm polyp was removed from ascending colon and another 1 measures 7 mm.  One 5 mm polyp was removed from the transverse colon.  There is tumor noted in the sigmoid colon, approximately 35 cm from the anus which was biopsied.  The tumor appeared to be fungating, infiltrative and ulcerated but nonobstructive.  It encompassed approximately one third of the circumference of the lumen.   07/26/2019 Pathology Results   Multiple polyps came back tubular adenomas.  2 ascending polyps came back sessile serrated adenoma months.  Sigmoid colon biopsy confirmed metastatic high-grade serous carcinoma.  The morphology and immunophenotype are consistent with metastatic high-grade serous carcinoma from tubal/ovarian primary site.   08/17/2019 - 12/12/2019 Chemotherapy  The patient had carboplatin and taxol   10/23/2019 Imaging   1. Sigmoid lesion with diminished size/conspicuity difficult to measure on the previous exam. Adjacent lymph nodes and nodularity less than a cm, largest on coronal image 48 measuring 5 mm. 2. Right rectus muscle with some thickening with mildly convex margin seen posteriorly on image 72 of series 2. This could be due to postsurgical change, however, metastatic disease is not excluded. Attention on follow-up is suggested. 3. No evidence for new metastatic disease. 4. Small hiatal hernia. 5. 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   Aortic Atherosclerosis (ICD10-I70.0).   01/01/2020 Imaging   1. No signs  of new disease. 2. Tiny lymph nodes in the area of concern in the LEFT lower quadrant z. 3. Added density and subtle contour irregularity involving the rectus muscles best seen on sagittal images, associated with site of prior surgical incision just to the RIGHT of midline (image 72, series 2 and image 58, series 5. Not significantly changed compared to prior studies. This measures approximately 2.1 x 1 cm in the sagittal plane and is less well-defined in the axial plane. Potentially postoperative change. Attention on follow-up. 4. Aortic atherosclerosis.   Aortic Atherosclerosis (ICD10-I70.0).       01/29/2020 -  Chemotherapy    Patient is on Treatment Plan: OVARIAN BEVACIZUMAB       05/06/2020 Imaging   1. Status post hysterectomy and oophorectomy. 2. No specific findings of recurrent or metastatic disease in the abdomen or pelvis. 3. No change in postoperative appearance of low midline incision. 4. Unchanged small prominent subcentimeter left iliac lymph nodes.   10/22/2020 Imaging   1. Status post hysterectomy and oophorectomy. No evidence of recurrent or metastatic disease within the abdomen or pelvis. 2. No significant change in the subcentimeter prominent left iliac lymph nodes.   04/11/2021 Imaging   1. No findings to suggest recurrent or metastatic disease in the abdomen or pelvis. 2. Aortic atherosclerosis, in addition to at least left anterior descending coronary artery disease. Assessment for potential risk factor modification, dietary therapy or pharmacologic therapy may be warranted, if clinically indicated. 3. Additional incidental findings, as above.     PHYSICAL EXAMINATION: ECOG PERFORMANCE STATUS: 1 - Symptomatic but completely ambulatory  Vitals:   04/11/21 0905  BP: 118/78  Pulse: 64  Resp: 18  Temp: 98.3 F (36.8 C)  SpO2: 98%   Filed Weights   04/11/21 0905  Weight: 197 lb 9.6 oz (89.6 kg)    GENERAL:alert, no distress and  comfortable Musculoskeletal:no cyanosis of digits and no clubbing  NEURO: alert & oriented x 3 with fluent speech, no focal motor/sensory deficits  LABORATORY DATA:  I have reviewed the data as listed    Component Value Date/Time   NA 142 04/10/2021 0759   NA 141 12/02/2016 1219   K 4.0 04/10/2021 0759   K 3.7 12/02/2016 1219   CL 105 04/10/2021 0759   CL 103 01/18/2013 1329   CO2 26 04/10/2021 0759   CO2 27 12/02/2016 1219   GLUCOSE 107 (H) 04/10/2021 0759   GLUCOSE 98 12/02/2016 1219   GLUCOSE 110 (H) 01/18/2013 1329   BUN 15 04/10/2021 0759   BUN 15.3 12/02/2016 1219   CREATININE 0.75 04/10/2021 0759   CREATININE 0.66 02/27/2021 0838   CREATININE 0.7 12/02/2016 1219   CALCIUM 9.1 04/10/2021 0759   CALCIUM 10.1 12/02/2016 1219   PROT 6.8 04/10/2021 0759   PROT 7.1 12/02/2016 1219   ALBUMIN 4.0 04/10/2021  0759   ALBUMIN 4.2 12/02/2016 1219   AST 31 04/10/2021 0759   AST 30 02/27/2021 0838   AST 21 12/02/2016 1219   ALT 35 04/10/2021 0759   ALT 36 02/27/2021 0838   ALT 27 12/02/2016 1219   ALKPHOS 70 04/10/2021 0759   ALKPHOS 104 12/02/2016 1219   BILITOT 0.6 04/10/2021 0759   BILITOT 0.9 02/27/2021 0838   BILITOT 0.37 12/02/2016 1219   GFRNONAA >60 04/10/2021 0759   GFRNONAA >60 02/27/2021 0838   GFRAA >60 05/07/2020 0833    No results found for: SPEP, UPEP  Lab Results  Component Value Date   WBC 4.6 04/10/2021   NEUTROABS 2.4 04/10/2021   HGB 13.3 04/10/2021   HCT 39.9 04/10/2021   MCV 90.7 04/10/2021   PLT 111 (L) 04/10/2021      Chemistry      Component Value Date/Time   NA 142 04/10/2021 0759   NA 141 12/02/2016 1219   K 4.0 04/10/2021 0759   K 3.7 12/02/2016 1219   CL 105 04/10/2021 0759   CL 103 01/18/2013 1329   CO2 26 04/10/2021 0759   CO2 27 12/02/2016 1219   BUN 15 04/10/2021 0759   BUN 15.3 12/02/2016 1219   CREATININE 0.75 04/10/2021 0759   CREATININE 0.66 02/27/2021 0838   CREATININE 0.7 12/02/2016 1219   GLU 236 12/13/2012 1558       Component Value Date/Time   CALCIUM 9.1 04/10/2021 0759   CALCIUM 10.1 12/02/2016 1219   ALKPHOS 70 04/10/2021 0759   ALKPHOS 104 12/02/2016 1219   AST 31 04/10/2021 0759   AST 30 02/27/2021 0838   AST 21 12/02/2016 1219   ALT 35 04/10/2021 0759   ALT 36 02/27/2021 0838   ALT 27 12/02/2016 1219   BILITOT 0.6 04/10/2021 0759   BILITOT 0.9 02/27/2021 0838   BILITOT 0.37 12/02/2016 1219       RADIOGRAPHIC STUDIES: I have reviewed multiple CT imaging with the patient I have personally reviewed the radiological images as listed and agreed with the findings in the report. CT ABDOMEN PELVIS W CONTRAST  Result Date: 04/11/2021 CLINICAL DATA:  75 year old female with history of ovarian cancer. Evaluate for treatment response. EXAM: CT ABDOMEN AND PELVIS WITH CONTRAST TECHNIQUE: Multidetector CT imaging of the abdomen and pelvis was performed using the standard protocol following bolus administration of intravenous contrast. CONTRAST:  35m OMNIPAQUE IOHEXOL 350 MG/ML SOLN COMPARISON:  CT the abdomen and pelvis 10/22/2020. FINDINGS: Lower chest: Area of probable chronic post infectious or inflammatory scarring in the inferior segment of the lingula. Calcifications of the mitral annulus. Atherosclerotic calcifications in the thoracic aorta as well as the left anterior descending coronary artery. Hepatobiliary: No suspicious cystic or solid hepatic lesions are noted. No intra or extrahepatic biliary ductal dilatation. Status post cholecystectomy. Pancreas: No pancreatic mass. No pancreatic ductal dilatation. No pancreatic or peripancreatic fluid collections or inflammatory changes. Spleen: Unremarkable. Adrenals/Urinary Tract: Subcentimeter low-attenuation lesions in both kidneys, too small to characterize, but statistically likely to represent tiny cysts. No suspicious renal lesions. No hydroureteronephrosis. Urinary bladder is normal in appearance. Bilateral adrenal glands are normal in appearance.  Stomach/Bowel: The appearance of the stomach is normal. There is no pathologic dilatation of small bowel or colon. The appendix is not confidently identified and may be surgically absent. Regardless, there are no inflammatory changes noted adjacent to the cecum to suggest the presence of an acute appendicitis at this time. Vascular/Lymphatic: Aortic atherosclerosis, without evidence of aneurysm or  dissection in the abdominal or pelvic vasculature. No lymphadenopathy noted in the abdomen or pelvis. Reproductive: Status post total abdominal hysterectomy and bilateral salpingo-oophorectomy. No unexpected soft tissue mass noted in the low anatomic pelvis to suggest locally recurrent disease. Other: No significant volume of ascites.  No pneumoperitoneum. Musculoskeletal: There are no aggressive appearing lytic or blastic lesions noted in the visualized portions of the skeleton. IMPRESSION: 1. No findings to suggest recurrent or metastatic disease in the abdomen or pelvis. 2. Aortic atherosclerosis, in addition to at least left anterior descending coronary artery disease. Assessment for potential risk factor modification, dietary therapy or pharmacologic therapy may be warranted, if clinically indicated. 3. Additional incidental findings, as above. Electronically Signed   By: Vinnie Langton M.D.   On: 04/11/2021 08:09

## 2021-04-11 NOTE — Assessment & Plan Note (Signed)
I have reviewed imaging study with the patient; she has signs of coronary atherosclerosis She has multiple cardiovascular risk factors and has significant class III obesity Recommend cardiology consult The patient would like to discuss this with her primary care doctor first She will continue lifestyle changes and risk factor modification

## 2021-04-11 NOTE — Assessment & Plan Note (Signed)
I have reviewed CT imaging with the patient She has no signs of disease relapse or progression She tolerated bevacizumab well The plan will be continue indefinitely I plan to repeat imaging study again in March 2023

## 2021-04-16 ENCOUNTER — Telehealth: Payer: Self-pay

## 2021-04-16 NOTE — Telephone Encounter (Signed)
Called and canceled appt on 10/14 with Dr. Alvy Bimler, per Dr. Alvy Bimler. Sent scheduling message for appts on 11/4. Velen is aware.

## 2021-05-02 ENCOUNTER — Other Ambulatory Visit: Payer: Medicare HMO

## 2021-05-02 ENCOUNTER — Inpatient Hospital Stay: Payer: Medicare HMO

## 2021-05-02 ENCOUNTER — Other Ambulatory Visit: Payer: Self-pay

## 2021-05-02 ENCOUNTER — Ambulatory Visit: Payer: Medicare HMO | Admitting: Hematology and Oncology

## 2021-05-02 ENCOUNTER — Encounter: Payer: Self-pay | Admitting: Hematology and Oncology

## 2021-05-02 ENCOUNTER — Inpatient Hospital Stay: Payer: Medicare HMO | Admitting: Hematology and Oncology

## 2021-05-02 DIAGNOSIS — C5701 Malignant neoplasm of right fallopian tube: Secondary | ICD-10-CM

## 2021-05-02 DIAGNOSIS — C5700 Malignant neoplasm of unspecified fallopian tube: Secondary | ICD-10-CM

## 2021-05-02 DIAGNOSIS — Z5112 Encounter for antineoplastic immunotherapy: Secondary | ICD-10-CM | POA: Diagnosis not present

## 2021-05-02 DIAGNOSIS — Z7189 Other specified counseling: Secondary | ICD-10-CM

## 2021-05-02 DIAGNOSIS — I10 Essential (primary) hypertension: Secondary | ICD-10-CM | POA: Diagnosis not present

## 2021-05-02 DIAGNOSIS — D696 Thrombocytopenia, unspecified: Secondary | ICD-10-CM

## 2021-05-02 LAB — CBC WITH DIFFERENTIAL/PLATELET
Abs Immature Granulocytes: 0.01 10*3/uL (ref 0.00–0.07)
Basophils Absolute: 0 10*3/uL (ref 0.0–0.1)
Basophils Relative: 1 %
Eosinophils Absolute: 0.1 10*3/uL (ref 0.0–0.5)
Eosinophils Relative: 2 %
HCT: 41.7 % (ref 36.0–46.0)
Hemoglobin: 13.7 g/dL (ref 12.0–15.0)
Immature Granulocytes: 0 %
Lymphocytes Relative: 30 %
Lymphs Abs: 1.4 10*3/uL (ref 0.7–4.0)
MCH: 30.2 pg (ref 26.0–34.0)
MCHC: 32.9 g/dL (ref 30.0–36.0)
MCV: 92.1 fL (ref 80.0–100.0)
Monocytes Absolute: 0.6 10*3/uL (ref 0.1–1.0)
Monocytes Relative: 12 %
Neutro Abs: 2.6 10*3/uL (ref 1.7–7.7)
Neutrophils Relative %: 55 %
Platelets: 123 10*3/uL — ABNORMAL LOW (ref 150–400)
RBC: 4.53 MIL/uL (ref 3.87–5.11)
RDW: 13.7 % (ref 11.5–15.5)
WBC: 4.6 10*3/uL (ref 4.0–10.5)
nRBC: 0 % (ref 0.0–0.2)

## 2021-05-02 LAB — COMPREHENSIVE METABOLIC PANEL
ALT: 41 U/L (ref 0–44)
AST: 36 U/L (ref 15–41)
Albumin: 4.1 g/dL (ref 3.5–5.0)
Alkaline Phosphatase: 83 U/L (ref 38–126)
Anion gap: 10 (ref 5–15)
BUN: 24 mg/dL — ABNORMAL HIGH (ref 8–23)
CO2: 25 mmol/L (ref 22–32)
Calcium: 9.8 mg/dL (ref 8.9–10.3)
Chloride: 107 mmol/L (ref 98–111)
Creatinine, Ser: 0.8 mg/dL (ref 0.44–1.00)
GFR, Estimated: >60 mL/min (ref >60)
Glucose, Bld: 116 mg/dL — ABNORMAL HIGH (ref 70–99)
Potassium: 4.1 mmol/L (ref 3.5–5.1)
Sodium: 142 mmol/L (ref 135–145)
Total Bilirubin: 0.6 mg/dL (ref 0.3–1.2)
Total Protein: 7.1 g/dL (ref 6.5–8.1)

## 2021-05-02 LAB — TOTAL PROTEIN, URINE DIPSTICK: Protein, ur: NEGATIVE mg/dL

## 2021-05-02 MED ORDER — HEPARIN SOD (PORK) LOCK FLUSH 100 UNIT/ML IV SOLN
500.0000 [IU] | Freq: Once | INTRAVENOUS | Status: AC | PRN
  Administered 2021-05-02: 500 [IU]

## 2021-05-02 MED ORDER — SODIUM CHLORIDE 0.9% FLUSH
10.0000 mL | Freq: Once | INTRAVENOUS | Status: AC
  Administered 2021-05-02: 10 mL

## 2021-05-02 MED ORDER — SODIUM CHLORIDE 0.9 % IV SOLN
15.0000 mg/kg | Freq: Once | INTRAVENOUS | Status: AC
  Administered 2021-05-02: 1300 mg via INTRAVENOUS
  Filled 2021-05-02: qty 48

## 2021-05-02 MED ORDER — SODIUM CHLORIDE 0.9% FLUSH
10.0000 mL | INTRAVENOUS | Status: DC | PRN
  Administered 2021-05-02: 10 mL

## 2021-05-02 MED ORDER — SODIUM CHLORIDE 0.9 % IV SOLN: Freq: Once | INTRAVENOUS | Status: AC

## 2021-05-02 NOTE — Assessment & Plan Note (Signed)
Her thrombocytopenia is stable Observe only for now 

## 2021-05-02 NOTE — Assessment & Plan Note (Signed)
Her blood pressure is well controlled She will continue close monitoring

## 2021-05-02 NOTE — Assessment & Plan Note (Signed)
Her last CT imaging showed no signs of disease relapse or progression She tolerated bevacizumab well The plan will be continue indefinitely I plan to repeat imaging study again in March 2023

## 2021-05-02 NOTE — Progress Notes (Signed)
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   All questions were answered. The patient knows to call the clinic with any problems, questions or concerns. The total time spent in the appointment was 20 minutes encounter with patients including review of chart and various tests results, discussions about plan of care and coordination of care plan   [name], MD 05/02/2021 9:06 AM  INTERVAL HISTORY: Please see below for problem oriented charting. she returns for treatment follow-up on bevacizumab for recurrent fallopian tube cancer According to the patient, her documented blood pressure at home is satisfactory She continues to complain of chronic dizziness and diarrhea which are unrelated to her treatment Denies abdominal pain, bloating or vaginal bleeding  REVIEW OF SYSTEMS:   Constitutional: Denies fevers, chills or abnormal weight loss Eyes: Denies blurriness of vision Ears, nose, mouth, throat, and face: Denies mucositis or sore throat Respiratory: Denies cough, dyspnea or wheezes Cardiovascular: Denies palpitation, chest discomfort or lower extremity swelling Gastrointestinal:  Denies nausea, heartburn or change in bowel habits Skin: Denies abnormal skin rashes Lymphatics: Denies new lymphadenopathy or easy bruising Neurological:Denies numbness, tingling or new  weaknesses Behavioral/Psych: Mood is stable, no new changes  All other systems were reviewed with the patient and are negative.  I have reviewed the past medical history, past surgical history, social history and family history with the patient and they are unchanged from previous note.  ALLERGIES:  is allergic to [employer] tartrate] and cortisone.  MEDICATIONS:  Current Outpatient Medications  Medication Sig Dispense Refill   amLODipine (NORVASC) 10 MG tablet Take 1 tablet (10 mg total) by mouth daily. 30 tablet 11   Armodafinil 250 MG tablet Take 1 tablet (250 mg total) by mouth every morning. 90 tablet 1   bevacizumab in sodium chloride 0.9 % 100 mL Inject into the vein once.     busPIRone (BUSPAR) 7.5 MG tablet Take 15 mg by mouth 2 (two) times daily.     chlorthalidone (HYGROTON) 25 MG tablet Take 25 mg by mouth daily.     cholecalciferol (VITAMIN D3) 25 MCG (1000 UT) tablet Take 1,000 Units by mouth daily.     colestipol (COLESTID) 1 g tablet Take 0.5-1 g by mouth daily. Pt taking 1/2 to 1 tablet daily depending on meal choice; taken 1 hour after morning meds , and can not eat for another hour     eszopiclone (LUNESTA) 2 MG TABS tablet Take immediately before bedtime 90 tablet 1   FLUoxetine (PROZAC) 20 MG capsule Take 40 mg by mouth every morning. Takes with a 58m capsule for her total dose of 353m     lidocaine-prilocaine (EMLA) cream Apply 1 application topically as needed. 30 g 1   MELATONIN PO Take 10 mg by mouth at bedtime.      metFORMIN (GLUCOPHAGE-XR) 500 MG 24 hr tablet Take 500 mg by mouth daily with breakfast.   3   metoprolol  tartrate (LOPRESSOR) 25 MG tablet Take 1 tablet (25 mg total) by mouth 2 (two) times daily. 60 tablet 3   modafinil (PROVIGIL) 200 MG tablet Take 1 tablet (200 mg total) by mouth daily. 30 tablet 0   Multiple Vitamin (MULTIVITAMIN WITH MINERALS) TABS Take 1 tablet by mouth every morning.      rosuvastatin (CRESTOR) 20 MG tablet Take 20 mg by  mouth every morning.      telmisartan (MICARDIS) 40 MG tablet Take 40 mg by mouth daily.     No current facility-administered medications for this visit.   Facility-Administered Medications Ordered in Other Visits  Medication Dose Route Frequency Provider Last Rate Last Admin   0.9 %  sodium chloride infusion   Intravenous Once Alvy Bimler, Moises Terpstra, MD       bevacizumab-bvzr (ZIRABEV) 1,300 mg in sodium chloride 0.9 % 100 mL chemo infusion  15 mg/kg (Treatment Plan Recorded) Intravenous Once Alvy Bimler, Eugene Isadore, MD       heparin lock flush 100 unit/mL  500 Units Intracatheter Once PRN Alvy Bimler, Diondra Pines, MD       sodium chloride flush (NS) 0.9 % injection 10 mL  10 mL Intravenous PRN Nancy Marus, MD   10 mL at 05/12/17 3151   sodium chloride flush (NS) 0.9 % injection 10 mL  10 mL Intracatheter PRN [name], MD        SUMMARY OF ONCOLOGIC HISTORY: Oncology History Overview Note  Oncologic Summary: 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  Interval development of large mid abdominal mass highly concerning for right ovarian cancer.  There is mild omental nodularity on the left, and peritoneal disease cannot be completely excluded.  There is no ascites or other evidence of metastatic disease. 2.  Mild associated renal pelvocaliectasis bilaterally without obstruction.  Nonobstructing left renal calculus and a small right renal angiomyolipoma noted incidentally.   11/08/2012 Pathology Results    1. 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 - BENIGN FALLOPIAN TUBE WITH NONSPECIFIC CHRONIC INFLAMMATION. 2. Ovary and fallopian tube, left - BENIGN OVARY; NEGATIVE FOR ATYPIA OR MALIGNANCY. - BENIGN FALLOPIAN TUBE; NEGATIVE FOR ATYPIA OR MALIGNANCY. 3. Omentum, resection for tumor - HIGH GRADE CARCINOMA, SEE COMMENT. 4. Appendix, Other than Incidental - FIBROUS OBLITERATION OF APPENDICEAL TIP. - NEGATIVE FOR MALIGNANCY.   11/08/2012 Surgery   Surgery: Exploratory laparotomy, bilateral salpingo-oophorectomy, appendectomy, infacolic omentectomy, optimal debulking   Surgeons:  Paola A. Alycia Rossetti, MD; Lahoma Crocker, MD    Assistant: Caswell Corwin  Pathology: Bilateral fallopian tubes and ovaries to pathology. Appendix as well as omentum. 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   Operative findings: 25 cm right adnexal mass with smooth surface. Surgically absent uterus. Atrophic-appearing left ovary. Normal appearing appendix. Within the omentum there were centimeter nodules scattered throughout the omentum. The remainder of the surfaces were benign.   12/08/2012 Procedure  Impression:  Placement of a subcutaneous port device.  The catheter tip is in the lower SVC and ready to be used.     12/13/2012 - 03/28/2013 Chemotherapy   s/p 6 cycles of paclitaxel and carboplatin   12/13/2012 - 03/28/2013 Chemotherapy   The patient had 6 cycles of carboplatin and Taxol   03/24/2013 Imaging   US abdomen   04/24/2013 Imaging   CT abdomen Interval resection of the large right pelvic and lower abdominal mass lesion with apparent omentectomy.  No evidence for intraperitoneal free fluid on today's study.  No discernible peritoneal lesions.   Interval thrombosis of  the right gonadal vein.   09/07/2013 Genetic Testing   Patient has genetic testing done for BRCA1/2 panel Results revealed patient has no mutation(s):   07/09/2015 Imaging   CT abdomen 1. 12 mm obstructive calculus at the left ureteropelvic junction with moderate proximal hydronephrosis. 2. 2 small supraumbilical ventral hernias, one containing a short segment of the mid transverse colon and the other containing a short segment of the mid small bowel. There is no associated evidence to suggest bowel incarceration or obstruction at this time. 3. Tiny locule of gas non dependently in the lumen of the urinary bladder. This is presumably iatrogenic related to recent catheterization for urinalysis. Alternatively, this could be seen in the setting of urinary tract infection with gas-forming organisms. Clinical correlation for history of recent catheterization is recommended. 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 5. Status post cholecystectomy. 6. Additional incidental findings, as above.     12/11/2016 Imaging   Ct abdomen 1. No evidence of metastatic ovarian cancer. 2. Recurrent subxiphoid ventral abdominal wall hernia containing transverse colon. No evidence of incarceration or obstruction. 3. Stable incidental findings in the liver and kidneys. No recurrent urinary tract calculus. 4. Progressive lower lumbar spondylosis. 5.  Aortic Atherosclerosis (ICD10-I70.0).     08/30/2017 Imaging   MRI thoracic spine 1. At T5-6 there is a small central disc protrusion contacting the ventral thoracic spinal cord. No central canal or foraminal stenosis. 2. At T9-10 there is a small right paracentral disc protrusion. 3.  No acute osseous injury of the thoracic spine. 4. No aggressive osseous lesion to suggest metastatic disease.   09/24/2017 Tumor Marker   Patient's tumor was tested for the following markers: CA-125 Results of the tumor marker test revealed 21.7   09/30/2017  Imaging   CT abdomen 1. New small clustered soft tissue nodules in the left lower quadrant in the sigmoid mesentery, largest 1.0 cm, which could represent recurrent peritoneal tumor implants. No ascites.  2. Midline high ventral abdominal wall hernia containing a portion of the transverse colon is mildly increased in size, and without bowel complication at this time. 3. Chronic findings include: Aortic Atherosclerosis (ICD10-I70.0). Diffuse hepatic steatosis. 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   10/11/2017 PET scan   1. Nodules in the sigmoid mesentery are hypermetabolic and highly worrisome for metastatic disease. 2. Attic steatosis.     11/03/2017 Procedure   Successful CT-guided rectus abdominal muscle mass core biopsy. Path: - FOREIGN BODY GIANT CELL REACTION INVOLVING FIBROADIPOSE TISSUE AND SKELETAL MUSCLE. - NO EVIDENCE OF MALIGNANCY.   11/04/2017 Cancer Staging   Staging form: Fallopian Tube, AJCC 7th Edition - Clinical: FIGO Stage IIIC, calculated as Stage IV (rT3, N0, M1) - Signed by [name], MD on 08/09/2019   02/2018 Imaging   CT: 1. Continued increase in size  small peritoneal nodules along the mesenteric border of the proximal sigmoid colon as well as along the serosal surface of the proximal sigmoid colon. 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 2. No evidence of distant disease. 3. Stable large ventral hernia.   05/2018 Imaging   PET: 1. Redemonstration of hypermetabolic nodules within the sigmoid mesocolon. Mild response to therapy relative to CT of 02/24/2018. Mixed response to therapy compared to the most recent PET of 10/11/2017. 2. Hypermetabolism within the right pelvic rectus musculature, increased since the prior PET.  Clinical service requested comparison to the 11/24/2017 CT. Index 10 mm nodule within the sigmoid mesocolon was similar to the 02/24/2018 CT, and as  described on that exam, increased from 7 mm on 11/24/2017. More inferior nodule within the mesocolon measures 12 mm today on image 156/4 and 8 mm on 11/24/2017.   05/2018 Imaging   CT: IMPRESSION: 1. Since 02/24/2018, decreased size of peritoneal nodules centered in the sigmoid mesocolon. 2. No evidence of new or progressive disease. 3. Hepatic steatosis. 4. Subcentimeter right renal angiomyolipoma, similar. 5.  Aortic Atherosclerosis (ICD10-I70.0). 6. Ventral abdominal wall laxity containing transverse colon, similar.   08/2018 Imaging   CT: IMPRESSION: 1. Nodules within the sigmoid mesocolon have decreased in size compared to prior. No new peritoneal or omental nodularity. 2. No evidence local recurrence the pelvis. 3. Ventral hernia contains a segment of transverse colon. No change from prior.     06/19/2019 Imaging   1. No evidence of metastatic disease in the abdomen pelvis. 2. No peritoneal or omental metastasis identified.  No free fluid. 3. Postcholecystectomy and hysterectomy. Comparison exams are made available. Comparison CT 09/08/2018 and 05/27/2018. PET-CT 05/27/2018    There is a nodule within the proximal aspect of the sigmoid colon measuring 2.2 by 2.2 cm. In comparison to prior CTs and PET-CT there was a hypermetabolic nodule at this location on the PET-CT of 08/27/2017 and on the CT of 09/08/2018 there was a small residual nodule. At that time (09/08/2018) the nodule measured 1.4 by 1.3 cm. Therefore this nodule has increased in the interval and concerning for recurrence of a serosal implant. The previous described lymph nodes in the sigmoid mesocolon and more central mesentery are not increased in size and not pathologic by size criteria.    Concern for recurrence of serosal metastasis in the proximal sigmoid colon with a 2 cm enlarging lesion. Lesion extends into the lumen. No evidence of high-grade obstruction. Consider FDG PET scan and/or potential colonoscopy for  evaluation.   06/29/2019 PET scan   1. Enlarging serosal implant within the proximal sigmoid colon has intense metabolic activity most consistent with malignancy. Lesion exhibits a somewhat indolent progression as present on PET-CT scan from 10/11/2017 and 05/27/2018. 2. Focal hypermetabolic activity within the RIGHT rectus muscle just off midline is slightly decreased from comparison exam. This may be benign inflammation related to prior laparotomy, however malignancy not excluded.   07/26/2019 Procedure   She had colonoscopy which showed multiple polyps.  6 polyps were removed from the cecum, measuring 3 to 6 mm in size.  One 12 mm polyp was removed from ascending colon and another 1 measures 7 mm.  One 5 mm polyp was removed from the transverse colon.  There is tumor noted in the sigmoid colon, approximately 35 cm from the anus which was biopsied.  The tumor appeared to be fungating, infiltrative and ulcerated but nonobstructive.  It encompassed approximately one third of the circumference of  the lumen.   07/26/2019 Pathology Results   Multiple polyps came back tubular adenomas.  2 ascending polyps came back sessile serrated adenoma months.  Sigmoid colon biopsy confirmed metastatic high-grade serous carcinoma.  The morphology and immunophenotype are consistent with metastatic high-grade serous carcinoma from tubal/ovarian primary site.   08/17/2019 - 12/12/2019 Chemotherapy   The patient had carboplatin and taxol   10/23/2019 Imaging   1. Sigmoid lesion with diminished size/conspicuity difficult to measure on the previous exam. Adjacent lymph nodes and nodularity less than a cm, largest on coronal image 48 measuring 5 mm. 2. Right rectus muscle with some thickening with mildly convex margin seen posteriorly on image 72 of series 2. This could be due to postsurgical change, however, metastatic disease is not excluded. Attention on follow-up is suggested. 3. No evidence for new metastatic disease. 4.  Small hiatal hernia. 5. 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   Aortic Atherosclerosis (ICD10-I70.0).   01/01/2020 Imaging   1. No signs of new disease. 2. Tiny lymph nodes in the area of concern in the LEFT lower quadrant z. 3. Added density and subtle contour irregularity involving the rectus muscles best seen on sagittal images, associated with site of prior surgical incision just to the RIGHT of midline (image 72, series 2 and image 58, series 5. Not significantly changed compared to prior studies. This measures approximately 2.1 x 1 cm in the sagittal plane and is less well-defined in the axial plane. Potentially postoperative change. Attention on follow-up. 4. Aortic atherosclerosis.   Aortic Atherosclerosis (ICD10-I70.0).       01/29/2020 -  Chemotherapy    Patient is on Treatment Plan: OVARIAN BEVACIZUMAB       05/06/2020 Imaging   1. Status post hysterectomy and oophorectomy. 2. No specific findings of recurrent or metastatic disease in the abdomen or pelvis. 3. No change in postoperative appearance of low midline incision. 4. Unchanged small prominent subcentimeter left iliac lymph nodes.   10/22/2020 Imaging   1. Status post hysterectomy and oophorectomy. No evidence of recurrent or metastatic disease within the abdomen or pelvis. 2. No significant change in the subcentimeter prominent left iliac lymph nodes.   04/11/2021 Imaging   1. No findings to suggest recurrent or metastatic disease in the abdomen or pelvis. 2. Aortic atherosclerosis, in addition to at least left anterior descending coronary artery disease. Assessment for potential risk factor modification, dietary therapy or pharmacologic therapy may be warranted, if clinically indicated. 3. Additional incidental findings, as above.     PHYSICAL EXAMINATION: ECOG PERFORMANCE STATUS: 1 - Symptomatic but completely ambulatory  Vitals:   05/02/21 0854  BP: 110/80  Pulse: 70  Resp: 18  Temp: 97.8 F (36.6  C)  SpO2: 100%   Filed Weights   05/02/21 0854  Weight: 197 lb 12.8 oz (89.7 kg)    GENERAL:alert, no distress and comfortable SKIN: skin color, texture, turgor are normal, no rashes or significant lesions EYES: normal, Conjunctiva are pink and non-injected, sclera clear OROPHARYNX:no exudate, no erythema and lips, buccal mucosa, and tongue normal  NECK: supple, thyroid normal size, non-tender, without nodularity LYMPH:  no palpable lymphadenopathy in the cervical, axillary or inguinal LUNGS: clear to auscultation and percussion with normal breathing effort HEART: regular rate & rhythm and no murmurs and no lower extremity edema ABDOMEN:abdomen soft, non-tender and normal bowel sounds Musculoskeletal:no cyanosis of digits and no clubbing  NEURO: alert & oriented x 3 with fluent speech, no focal motor/sensory deficits  LABORATORY DATA:  I have reviewed the data as listed    Component Value Date/Time   NA 142 04/10/2021 0759   NA 141 12/02/2016 1219   K 4.0 04/10/2021 0759   K 3.7 12/02/2016 1219   CL 105 04/10/2021 0759   CL 103 01/18/2013 1329   CO2 26 04/10/2021 0759   CO2 27 12/02/2016 1219   GLUCOSE 107 (H) 04/10/2021 0759   GLUCOSE 98 12/02/2016 1219   GLUCOSE 110 (H) 01/18/2013 1329   BUN 15 04/10/2021 0759   BUN 15.3 12/02/2016 1219   CREATININE 0.75 04/10/2021 0759   CREATININE 0.66 02/27/2021 0838   CREATININE 0.7 12/02/2016 1219   CALCIUM 9.1 04/10/2021 0759   CALCIUM 10.1 12/02/2016 1219   PROT 6.8 04/10/2021 0759   PROT 7.1 12/02/2016 1219   ALBUMIN 4.0 04/10/2021 0759   ALBUMIN 4.2 12/02/2016 1219   AST 31 04/10/2021 0759   AST 30 02/27/2021 0838   AST 21 12/02/2016 1219   ALT 35 04/10/2021 0759   ALT 36 02/27/2021 0838   ALT 27 12/02/2016 1219   ALKPHOS 70 04/10/2021 0759   ALKPHOS 104 12/02/2016 1219   BILITOT 0.6 04/10/2021 0759   BILITOT 0.9 02/27/2021 0838   BILITOT 0.37 12/02/2016 1219   GFRNONAA >60 04/10/2021 0759   GFRNONAA >60  02/27/2021 0838   GFRAA >60 05/07/2020 0833    No results found for: SPEP, UPEP  Lab Results  Component Value Date   WBC 4.6 05/02/2021   NEUTROABS 2.6 05/02/2021   HGB 13.7 05/02/2021   HCT 41.7 05/02/2021   MCV 92.1 05/02/2021   PLT 123 (L) 05/02/2021      Chemistry      Component Value Date/Time   NA 142 04/10/2021 0759   NA 141 12/02/2016 1219   K 4.0 04/10/2021 0759   K 3.7 12/02/2016 1219   CL 105 04/10/2021 0759   CL 103 01/18/2013 1329   CO2 26 04/10/2021 0759   CO2 27 12/02/2016 1219   BUN 15 04/10/2021 0759   BUN 15.3 12/02/2016 1219   CREATININE 0.75 04/10/2021 0759   CREATININE 0.66 02/27/2021 0838   CREATININE 0.7 12/02/2016 1219   GLU 236 12/13/2012 1558      Component Value Date/Time   CALCIUM 9.1 04/10/2021 0759   CALCIUM 10.1 12/02/2016 1219   ALKPHOS 70 04/10/2021 0759   ALKPHOS 104 12/02/2016 1219   AST 31 04/10/2021 0759   AST 30 02/27/2021 0838   AST 21 12/02/2016 1219   ALT 35 04/10/2021 0759   ALT 36 02/27/2021 0838   ALT 27 12/02/2016 1219   BILITOT 0.6 04/10/2021 0759   BILITOT 0.9 02/27/2021 0838   BILITOT 0.37 12/02/2016 1219

## 2021-05-02 NOTE — Patient Instructions (Signed)
[facility] MEDICAL ONCOLOGY  ° Discharge Instructions: °Thank you for choosing [facility] to provide your oncology and hematology care.  ° °If you have a lab appointment with the Cancer Center, please go directly to the Cancer Center and check in at the registration area. °  °Wear comfortable clothing and clothing appropriate for easy access to any Portacath or PICC line.  ° °We strive to give you quality time with your provider. You may need to reschedule your appointment if you arrive late (15 or more minutes).  Arriving late affects you and other patients whose appointments are after yours.  Also, if you miss three or more appointments without notifying the office, you may be dismissed from the clinic at the provider’s discretion.    °  °For prescription refill requests, have your pharmacy contact our office and allow 72 hours for refills to be completed.   ° °Today you received the following chemotherapy and/or immunotherapy agents: Bevacizumab (Avastin)    °  °To help prevent nausea and vomiting after your treatment, we encourage you to take your nausea medication as directed. ° °BELOW ARE SYMPTOMS THAT SHOULD BE REPORTED IMMEDIATELY: °*FEVER GREATER THAN 100.4 F (38 °C) OR HIGHER °*CHILLS OR SWEATING °*NAUSEA AND VOMITING THAT IS NOT CONTROLLED WITH YOUR NAUSEA MEDICATION °*UNUSUAL SHORTNESS OF BREATH °*UNUSUAL BRUISING OR BLEEDING °*URINARY PROBLEMS (pain or burning when urinating, or frequent urination) °*BOWEL PROBLEMS (unusual diarrhea, constipation, pain near the anus) °TENDERNESS IN MOUTH AND THROAT WITH OR WITHOUT PRESENCE OF ULCERS (sore throat, sores in mouth, or a toothache) °UNUSUAL RASH, SWELLING OR PAIN  °UNUSUAL VAGINAL DISCHARGE OR ITCHING  ° °Items with * indicate a potential emergency and should be followed up as soon as possible or go to the Emergency Department if any problems should occur. ° °Please show the CHEMOTHERAPY ALERT CARD or IMMUNOTHERAPY ALERT CARD  at check-in to the Emergency Department and triage nurse. ° °Should you have questions after your visit or need to cancel or reschedule your appointment, please contact [facility]COL[facility]  Dept: [phone]  and follow the prompts.  Office hours are 8:00 a.m. to 4:30 p.m. Monday - Friday. Please note that voicemails left after 4:00 p.m. may not be returned until the following business day.  We are closed weekends and major holidays. You have access to a nurse at all times for urgent questions. Please call the main number to the clinic Dept: [phone] and follow the prompts. ° ° °For any non-urgent questions, you may also contact your provider using MyChart. We now offer e-Visits for anyone 18 and older to request care online for non-urgent symptoms. For details visit [email or web address]. °  °Also download the MyChart app! Go to the app store, search "MyChart", open the app, select [facility], and log in with your MyChart username and password. ° °Due to Covid, a mask is required upon entering the hospital/clinic. If you do not have a mask, one will be given to you upon arrival. For doctor visits, patients may have 1 support person aged 18 or older with them. For treatment visits, patients cannot have anyone with them due to current Covid guidelines and our immunocompromised population.  ° °

## 2021-05-09 DIAGNOSIS — I2510 Atherosclerotic heart disease of native coronary artery without angina pectoris: Secondary | ICD-10-CM | POA: Diagnosis not present

## 2021-05-09 DIAGNOSIS — I10 Essential (primary) hypertension: Secondary | ICD-10-CM | POA: Diagnosis not present

## 2021-05-09 DIAGNOSIS — M159 Polyosteoarthritis, unspecified: Secondary | ICD-10-CM | POA: Diagnosis not present

## 2021-05-09 DIAGNOSIS — E782 Mixed hyperlipidemia: Secondary | ICD-10-CM | POA: Diagnosis not present

## 2021-05-09 DIAGNOSIS — E1159 Type 2 diabetes mellitus with other circulatory complications: Secondary | ICD-10-CM | POA: Diagnosis not present

## 2021-05-14 ENCOUNTER — Other Ambulatory Visit: Payer: Self-pay | Admitting: Neurology

## 2021-05-14 DIAGNOSIS — F5102 Adjustment insomnia: Secondary | ICD-10-CM

## 2021-05-14 DIAGNOSIS — G4733 Obstructive sleep apnea (adult) (pediatric): Secondary | ICD-10-CM

## 2021-05-14 NOTE — Telephone Encounter (Signed)
Received refill request for Lunesta.  Last OV was on 09/24/20.  Next OV has not been scheduled. Due around 09/2021 Last RX was written on 04/16/21 for 30 tabs.   [place] Drug Database has been reviewed.

## 2021-05-23 ENCOUNTER — Inpatient Hospital Stay: Payer: Medicare HMO

## 2021-05-23 ENCOUNTER — Ambulatory Visit: Payer: Medicare HMO | Admitting: Hematology and Oncology

## 2021-05-23 ENCOUNTER — Other Ambulatory Visit: Payer: Medicare HMO

## 2021-05-23 ENCOUNTER — Other Ambulatory Visit: Payer: Self-pay

## 2021-05-23 ENCOUNTER — Inpatient Hospital Stay: Payer: Medicare HMO | Attending: Gynecologic Oncology

## 2021-05-23 VITALS — BP 139/89 | HR 70 | Temp 98.1°F | Resp 18 | Ht 59.0 in | Wt 198.4 lb

## 2021-05-23 DIAGNOSIS — Z7189 Other specified counseling: Secondary | ICD-10-CM

## 2021-05-23 DIAGNOSIS — C5700 Malignant neoplasm of unspecified fallopian tube: Secondary | ICD-10-CM

## 2021-05-23 DIAGNOSIS — C5701 Malignant neoplasm of right fallopian tube: Secondary | ICD-10-CM

## 2021-05-23 DIAGNOSIS — Z5112 Encounter for antineoplastic immunotherapy: Secondary | ICD-10-CM | POA: Diagnosis not present

## 2021-05-23 LAB — CBC WITH DIFFERENTIAL/PLATELET
Abs Immature Granulocytes: 0.01 10*3/uL (ref 0.00–0.07)
Basophils Absolute: 0 10*3/uL (ref 0.0–0.1)
Basophils Relative: 0 %
Eosinophils Absolute: 0.1 10*3/uL (ref 0.0–0.5)
Eosinophils Relative: 2 %
HCT: 41.7 % (ref 36.0–46.0)
Hemoglobin: 13.7 g/dL (ref 12.0–15.0)
Immature Granulocytes: 0 %
Lymphocytes Relative: 31 %
Lymphs Abs: 1.7 10*3/uL (ref 0.7–4.0)
MCH: 30 pg (ref 26.0–34.0)
MCHC: 32.9 g/dL (ref 30.0–36.0)
MCV: 91.4 fL (ref 80.0–100.0)
Monocytes Absolute: 0.6 10*3/uL (ref 0.1–1.0)
Monocytes Relative: 10 %
Neutro Abs: 3.1 10*3/uL (ref 1.7–7.7)
Neutrophils Relative %: 57 %
Platelets: 135 10*3/uL — ABNORMAL LOW (ref 150–400)
RBC: 4.56 MIL/uL (ref 3.87–5.11)
RDW: 13.7 % (ref 11.5–15.5)
WBC: 5.5 10*3/uL (ref 4.0–10.5)
nRBC: 0 % (ref 0.0–0.2)

## 2021-05-23 LAB — CMP (CANCER CENTER ONLY)
ALT: 26 U/L (ref 0–44)
AST: 25 U/L (ref 15–41)
Albumin: 4.3 g/dL (ref 3.5–5.0)
Alkaline Phosphatase: 77 U/L (ref 38–126)
Anion gap: 7 (ref 5–15)
BUN: 22 mg/dL (ref 8–23)
CO2: 29 mmol/L (ref 22–32)
Calcium: 10.2 mg/dL (ref 8.9–10.3)
Chloride: 101 mmol/L (ref 98–111)
Creatinine: 0.71 mg/dL (ref 0.44–1.00)
GFR, Estimated: >60 mL/min (ref >60)
Glucose, Bld: 110 mg/dL — ABNORMAL HIGH (ref 70–99)
Potassium: 3.9 mmol/L (ref 3.5–5.1)
Sodium: 137 mmol/L (ref 135–145)
Total Bilirubin: 0.6 mg/dL (ref 0.3–1.2)
Total Protein: 7.2 g/dL (ref 6.5–8.1)

## 2021-05-23 LAB — TOTAL PROTEIN, URINE DIPSTICK: Protein, ur: <30 mg/dL

## 2021-05-23 MED ORDER — HEPARIN SOD (PORK) LOCK FLUSH 100 UNIT/ML IV SOLN
500.0000 [IU] | Freq: Once | INTRAVENOUS | Status: AC | PRN
  Administered 2021-05-23: 500 [IU]

## 2021-05-23 MED ORDER — SODIUM CHLORIDE 0.9% FLUSH
10.0000 mL | INTRAVENOUS | Status: DC | PRN
  Administered 2021-05-23: 10 mL

## 2021-05-23 MED ORDER — SODIUM CHLORIDE 0.9 % IV SOLN: Freq: Once | INTRAVENOUS | Status: AC

## 2021-05-23 MED ORDER — SODIUM CHLORIDE 0.9 % IV SOLN
15.0000 mg/kg | Freq: Once | INTRAVENOUS | Status: AC
  Administered 2021-05-23: 1300 mg via INTRAVENOUS
  Filled 2021-05-23: qty 48

## 2021-05-23 MED ORDER — SODIUM CHLORIDE 0.9% FLUSH
10.0000 mL | Freq: Once | INTRAVENOUS | Status: AC
  Administered 2021-05-23: 10 mL

## 2021-05-23 NOTE — Patient Instructions (Signed)
[facility]  Discharge Instructions: Thank you for choosing [facility] to provide your oncology and hematology care.   If you have a lab appointment with the Cancer Center, please go directly to the Cancer Center and check in at the registration area.   Wear comfortable clothing and clothing appropriate for easy access to any Portacath or PICC line.   We strive to give you quality time with your provider. You may need to reschedule your appointment if you arrive late (15 or more minutes).  Arriving late affects you and other patients whose appointments are after yours.  Also, if you miss three or more appointments without notifying the office, you may be dismissed from the clinic at the provider's discretion.      For prescription refill requests, have your pharmacy contact our office and allow 72 hours for refills to be completed.    Today you received the following chemotherapy and/or immunotherapy agents [id number]      To help prevent nausea and vomiting after your treatment, we encourage you to take your nausea medication as directed.  BELOW ARE SYMPTOMS THAT SHOULD BE REPORTED IMMEDIATELY: *FEVER GREATER THAN 100.4 F (38 C) OR HIGHER *CHILLS OR SWEATING *NAUSEA AND VOMITING THAT IS NOT CONTROLLED WITH YOUR NAUSEA MEDICATION *UNUSUAL SHORTNESS OF BREATH *UNUSUAL BRUISING OR BLEEDING *URINARY PROBLEMS (pain or burning when urinating, or frequent urination) *BOWEL PROBLEMS (unusual diarrhea, constipation, pain near the anus) TENDERNESS IN MOUTH AND THROAT WITH OR WITHOUT PRESENCE OF ULCERS (sore throat, sores in mouth, or a toothache) UNUSUAL RASH, SWELLING OR PAIN  UNUSUAL VAGINAL DISCHARGE OR ITCHING   Items with * indicate a potential emergency and should be followed up as soon as possible or go to the Emergency Department if any problems should occur.  Please show the CHEMOTHERAPY ALERT CARD or IMMUNOTHERAPY ALERT CARD at  check-in to the Emergency Department and triage nurse.  Should you have questions after your visit or need to cancel or reschedule your appointment, please contact [facility]  Dept: [phone]  and follow the prompts.  Office hours are 8:00 a.m. to 4:30 p.m. Monday - Friday. Please note that voicemails left after 4:00 p.m. may not be returned until the following business day.  We are closed weekends and major holidays. You have access to a nurse at all times for urgent questions. Please call the main number to the clinic Dept: [phone] and follow the prompts.   For any non-urgent questions, you may also contact your provider using MyChart. We now offer e-Visits for anyone 18 and older to request care online for non-urgent symptoms. For details visit [email or web address].   Also download the MyChart app! Go to the app store, search "MyChart", open the app, select [facility], and log in with your MyChart username and password.  Due to Covid, a mask is required upon entering the hospital/clinic. If you do not have a mask, one will be given to you upon arrival. For doctor visits, patients may have 1 support person aged 18 or older with them. For treatment visits, patients cannot have anyone with them due to current Covid guidelines and our immunocompromised population.   

## 2021-06-08 NOTE — Progress Notes (Addendum)
Cardiology Office Note:    Date:  06/08/2021   ID:  Kristin Webb, DOB [birth year], MRN [id number]  PCP:  Kristin Caraway, MD   [facility] Providers Cardiologist:  None     Referring MD: Kristin Caraway, MD   No chief complaint on file. CAD  History of Present Illness:    Kristin Webb is a 75 y.o. female with a hx anxiety with PTSD,  ovarian cancer on bevacizumab Day 1, cycle 23 05/23/2021  referred for LAD dx noted on CT, DMII on metformin here for CVD risk assessment.  She notes shortness of breath for awhile since gaining weight. No angina. She has fibromyalgia and has pain all over and pain in the legs. She has OSA and CPAP.  Pleuritic right rib pain with coughing and sneezing. She denies significant lower extremity edema, PND or orthopnea. No palpitations,no syncope. She feels out of balance with standing. No falls. She has no CVD hx. She has no stress test or LHC. She has worked with a Automotive engineer.   CVD Risk/Equivalent: HLD- on crestor HTN- Yes PAD- No DMII- yes,A1c 6.1 % on metformin Smoker-No Stroke-No Premature Family History- No    CT Scan 04/10/2021 Aortic atherosclerosis, in addition to at least left anterior descending coronary artery disease. Assessment for potential risk factor modification, dietary therapy or pharmacologic therapy may be warranted, if clinically indicated.    Cardiology Hx/Studies  07/11/2015 - Left ventricle: The cavity size was normal. Wall thickness was    increased in a pattern of mild LVH. Systolic function was normal.    The estimated ejection fraction was in the range of 55% to 60%.    Wall motion was normal; there were no regional wall motion    abnormalities.  - Mitral valve: Mildly calcified annulus. There was mild    regurgitation.  - Left atrium: The atrium was mildly dilated.    Cardio Oncology Therapy exposure- s/p 6 cycles of paclitaxel and carboplatin 2014 10/22/2020- s/p hysterectomy and oophorectomy, no  mets Bevacizumab 01/29/2020-;no metastasis  Past Medical History:  Diagnosis Date   Burning mouth syndrome    Chronic fatigue    Constipation    Diabetes ([place]) 05/09/2018   Diarrhea    in the past after gallbladder removal   Fibromyalgia    GERD (gastroesophageal reflux disease)    History of kidney stones 07/2015   Hyperlipidemia    Hypertension    borderline not on meds    Insomnia secondary to depression with anxiety    Lumbar disc disease    Ovarian cancer ([place]) 2014/2020   met nodule in abd   Pelvic mass in female    Pneumonia    hx of pneumonia as an infant   PONV (postoperative nausea and vomiting)    pain from gas hernia 2017   Shortness of breath    with exertion    Sleep apnea    uses CPAP   Yeast infection     Past Surgical History:  Procedure Laterality Date   ABDOMINAL HYSTERECTOMY     early 28s   APPENDECTOMY     WITH DEBULKING/BSO   CHOLECYSTECTOMY     early 63s   CYSTOSCOPY W/ URETERAL STENT PLACEMENT Left 07/09/2015   DUE TO NEPHROLITHIASIS Procedure: CYSTOSCOPY WITH LEFT RETROGRADE PYELOGRAM/ LEFT URETERAL STENT PLACEMENT;  Surgeon: Ardis Hughs, MD;  Location: [facility];  Service: Urology;  Laterality: Left;   HERNIA REPAIR     INCISIONAL HERNIA REPAIR N/A 12/25/2015   WITH  MESH Procedure:  INCISIONAL HERNIA REPAIR;  Surgeon: Ralene Ok, MD;  Location: [facility];  Service: General;  Laterality: N/A;   [place] N/A 10/21/2018   Procedure: LAPAROSCOPIC INCISIONAL HERNIA REPAIR WITH MESH;  Surgeon: Ralene Ok, MD;  Location: [facility];  Service: General;  Laterality: N/A;   INSERTION OF MESH N/A 12/25/2015   Procedure: INSERTION OF MESH;  Surgeon: Ralene Ok, MD;  Location: [facility];  Service: General;  Laterality: N/A;   LAPAROTOMY Bilateral 11/08/2012   Procedure: EXPLORATORY LAPAROTOMY BILATERAL SALPINGO OOPHORECTOMY TUMOR DEBULKING ;  Surgeon: Imagene Gurney A. Alycia Rossetti, MD;  Location: [facility];  Service: Gynecology;  Laterality:  Bilateral;  APPENDECTOMY / OMENTECTOMY   LAPAROTOMY N/A 12/25/2015   Procedure: EXPLORATORY LAPAROTOMY;  Surgeon: Ralene Ok, MD;  Location: [facility];  Service: General;  Laterality: N/A;   LYSIS OF ADHESION N/A 12/25/2015   Procedure: LYSIS OF ADHESION;  Surgeon: Ralene Ok, MD;  Location: [facility];  Service: General;  Laterality: N/A;   PARTIAL HYSTERECTOMY  11-02-2004   TRIGGER FINGER RELEASE      Current Medications: No outpatient medications have been marked as taking for the 06/09/21 encounter (Appointment) with Janina Mayo, MD.     Allergies:   Ambien [zolpidem tartrate] and Cortisone   Social History   Socioeconomic History   Marital status: Widowed    Spouse name: Kristin Webb   Number of children: 2   Years of education: Master's   Highest education level: Not on file  Occupational History   Not on file  Tobacco Use   Smoking status: Never   Smokeless tobacco: Never  Vaping Use   Vaping Use: Never used  Substance and Sexual Activity   Alcohol use: No   Drug use: No   Sexual activity: Not on file  Other Topics Concern   Not on file  Social History Narrative   Patient is married Kristin Webb). Husband passed away in 11/02/14   Patient has 2 children by birth and 13 children all together.   Patient has a Oceanographer.   Patient is right-handed.   Patient drinks very little caffeine.         Social Determinants of Health   Financial Resource Strain: Not on file  Food Insecurity: Not on file  Transportation Needs: Not on file  Physical Activity: Not on file  Stress: Not on file  Social Connections: Not on file     Family History: The patient's family history includes High Cholesterol in her mother; High blood pressure in her mother; Kidney disease in an other family member; Lung cancer (age of onset: 46) in her father; Lung cancer (age of onset: 53) in her mother; Parkinson's disease in her father.  ROS:   Please see the history of present illness.     All other  systems reviewed and are negative.  EKGs/Labs/Other Studies Reviewed:    The following studies were reviewed today:   EKG:  EKG is  ordered today.  The ekg ordered today demonstrates   NSR, non specific ST-T changes  Recent Labs: 05/23/2021: ALT 26; BUN 22; Creatinine 0.71; Hemoglobin 13.7; Platelets 135; Potassium 3.9; Sodium 137  Recent Lipid Panel No results found for: CHOL, TRIG, HDL, CHOLHDL, VLDL, LDLCALC, LDLDIRECT   Risk Assessment/Calculations:     ASCVD 39% High LDL 86 TC 171   Physical Exam:    VS:  There were no vitals taken for this visit.    Wt Readings from Last 3 Encounters:  05/23/21 198  lb 6.4 oz (90 kg)  05/02/21 197 lb 12.8 oz (89.7 kg)  04/11/21 197 lb 9.6 oz (89.6 kg)     GEN:  Well nourished, well developed in no acute distress. anxious HEENT: Normal NECK: No JVD; No carotid bruits CARDIAC: RRR,+SEM RUSB rubs, gallops RESPIRATORY:  Clear to auscultation without rales, wheezing or rhonchi  ABDOMEN: Soft, non-tender, non-distended MUSCULOSKELETAL:  No edema; No deformity  SKIN: Warm and dry NEUROLOGIC:  Alert and oriented x 3 PSYCHIATRIC:  Normal affect   ASSESSMENT:    #LAD Dx: see some distal disease noted, she is asymptomatic. Considering potential cardiotoxic risk with avastin, cardiac risk factors for CAD will obtain an echocardiogram.  #HTN: well controlled SBP 130s ambulatory.Continue current regimen  #Cardio-Oncology- For bevacizumab.One meta analysis noted risk for cardiac and cerebral ischemia as well as hypertension in patients especially with high dosing;Totzeck et al. Ronalee Belts 2017  PLAN:    In order of problems listed above:  Echocardiogram Follow up 6 months      Medication Adjustments/Labs and Tests Ordered: Current medicines are reviewed at length with the patient today.  Concerns regarding medicines are outlined above.    Signed, Janina Mayo, MD  06/08/2021 7:36 PM    [facility]

## 2021-06-09 ENCOUNTER — Other Ambulatory Visit: Payer: Self-pay

## 2021-06-09 ENCOUNTER — Encounter: Payer: Self-pay | Admitting: Internal Medicine

## 2021-06-09 ENCOUNTER — Ambulatory Visit (INDEPENDENT_AMBULATORY_CARE_PROVIDER_SITE_OTHER): Payer: Medicare HMO | Admitting: Internal Medicine

## 2021-06-09 VITALS — BP 144/82 | HR 64 | Ht 59.0 in | Wt 195.6 lb

## 2021-06-09 DIAGNOSIS — R0602 Shortness of breath: Secondary | ICD-10-CM

## 2021-06-09 DIAGNOSIS — M159 Polyosteoarthritis, unspecified: Secondary | ICD-10-CM | POA: Diagnosis not present

## 2021-06-09 DIAGNOSIS — E1159 Type 2 diabetes mellitus with other circulatory complications: Secondary | ICD-10-CM | POA: Diagnosis not present

## 2021-06-09 DIAGNOSIS — I10 Essential (primary) hypertension: Secondary | ICD-10-CM | POA: Diagnosis not present

## 2021-06-09 DIAGNOSIS — E782 Mixed hyperlipidemia: Secondary | ICD-10-CM | POA: Diagnosis not present

## 2021-06-09 DIAGNOSIS — I2510 Atherosclerotic heart disease of native coronary artery without angina pectoris: Secondary | ICD-10-CM | POA: Diagnosis not present

## 2021-06-09 NOTE — Patient Instructions (Signed)
Medication Instructions:  Your Physician recommend you continue on your current medication as directed.    *If you need a refill on your cardiac medications before your next appointment, please call your pharmacy*    Testing/Procedures: Your physician has requested that you have an echocardiogram. Echocardiography is a painless test that uses sound waves to create images of your heart. It provides your doctor with information about the size and shape of your heart and how well your heart's chambers and valves are working. This procedure takes approximately one hour. There are no restrictions for this procedure.   Follow-Up: At [facility], you and your health needs are our priority.  As part of our continuing mission to provide you with exceptional heart care, we have created designated Provider Care Teams.  These Care Teams include your primary Cardiologist (physician) and Advanced Practice Providers (APPs -  Physician Assistants and Nurse Practitioners) who all work together to provide you with the care you need, when you need it.  We recommend signing up for the patient portal called "MyChart".  Sign up information is provided on this After Visit Summary.  MyChart is used to connect with patients for Virtual Visits (Telemedicine).  Patients are able to view lab/test results, encounter notes, upcoming appointments, etc.  Non-urgent messages can be sent to your provider as well.   To learn more about what you can do with MyChart, go to [email or web address].    Your next appointment:   6 month(s)  The format for your next appointment:   In Person  Provider:   Dr. Harl Bowie

## 2021-06-13 ENCOUNTER — Other Ambulatory Visit: Payer: Self-pay

## 2021-06-13 ENCOUNTER — Inpatient Hospital Stay: Payer: Medicare HMO

## 2021-06-13 ENCOUNTER — Inpatient Hospital Stay: Payer: Medicare HMO | Attending: Gynecologic Oncology

## 2021-06-13 ENCOUNTER — Encounter: Payer: Self-pay | Admitting: Hematology and Oncology

## 2021-06-13 ENCOUNTER — Inpatient Hospital Stay (HOSPITAL_BASED_OUTPATIENT_CLINIC_OR_DEPARTMENT_OTHER): Payer: Medicare HMO | Admitting: Hematology and Oncology

## 2021-06-13 DIAGNOSIS — C5700 Malignant neoplasm of unspecified fallopian tube: Secondary | ICD-10-CM

## 2021-06-13 DIAGNOSIS — D696 Thrombocytopenia, unspecified: Secondary | ICD-10-CM | POA: Diagnosis not present

## 2021-06-13 DIAGNOSIS — C5701 Malignant neoplasm of right fallopian tube: Secondary | ICD-10-CM

## 2021-06-13 DIAGNOSIS — Z7189 Other specified counseling: Secondary | ICD-10-CM

## 2021-06-13 DIAGNOSIS — Z5112 Encounter for antineoplastic immunotherapy: Secondary | ICD-10-CM | POA: Insufficient documentation

## 2021-06-13 DIAGNOSIS — I10 Essential (primary) hypertension: Secondary | ICD-10-CM

## 2021-06-13 LAB — CBC WITH DIFFERENTIAL/PLATELET
Abs Immature Granulocytes: 0.01 10*3/uL (ref 0.00–0.07)
Basophils Absolute: 0 10*3/uL (ref 0.0–0.1)
Basophils Relative: 0 %
Eosinophils Absolute: 0.1 10*3/uL (ref 0.0–0.5)
Eosinophils Relative: 1 %
HCT: 42 % (ref 36.0–46.0)
Hemoglobin: 13.9 g/dL (ref 12.0–15.0)
Immature Granulocytes: 0 %
Lymphocytes Relative: 30 %
Lymphs Abs: 1.6 10*3/uL (ref 0.7–4.0)
MCH: 29.8 pg (ref 26.0–34.0)
MCHC: 33.1 g/dL (ref 30.0–36.0)
MCV: 89.9 fL (ref 80.0–100.0)
Monocytes Absolute: 0.5 10*3/uL (ref 0.1–1.0)
Monocytes Relative: 9 %
Neutro Abs: 3.1 10*3/uL (ref 1.7–7.7)
Neutrophils Relative %: 60 %
Platelets: 121 10*3/uL — ABNORMAL LOW (ref 150–400)
RBC: 4.67 MIL/uL (ref 3.87–5.11)
RDW: 13.5 % (ref 11.5–15.5)
WBC: 5.2 10*3/uL (ref 4.0–10.5)
nRBC: 0 % (ref 0.0–0.2)

## 2021-06-13 LAB — TOTAL PROTEIN, URINE DIPSTICK: Protein, ur: NEGATIVE mg/dL

## 2021-06-13 LAB — COMPREHENSIVE METABOLIC PANEL
ALT: 32 U/L (ref 0–44)
AST: 30 U/L (ref 15–41)
Albumin: 4.1 g/dL (ref 3.5–5.0)
Alkaline Phosphatase: 84 U/L (ref 38–126)
Anion gap: 10 (ref 5–15)
BUN: 13 mg/dL (ref 8–23)
CO2: 26 mmol/L (ref 22–32)
Calcium: 9.6 mg/dL (ref 8.9–10.3)
Chloride: 103 mmol/L (ref 98–111)
Creatinine, Ser: 0.74 mg/dL (ref 0.44–1.00)
GFR, Estimated: >60 mL/min (ref >60)
Glucose, Bld: 111 mg/dL — ABNORMAL HIGH (ref 70–99)
Potassium: 3.7 mmol/L (ref 3.5–5.1)
Sodium: 139 mmol/L (ref 135–145)
Total Bilirubin: 0.5 mg/dL (ref 0.3–1.2)
Total Protein: 7 g/dL (ref 6.5–8.1)

## 2021-06-13 MED ORDER — SODIUM CHLORIDE 0.9 % IV SOLN
15.0000 mg/kg | Freq: Once | INTRAVENOUS | Status: AC
  Administered 2021-06-13: 1300 mg via INTRAVENOUS
  Filled 2021-06-13: qty 4

## 2021-06-13 MED ORDER — HEPARIN SOD (PORK) LOCK FLUSH 100 UNIT/ML IV SOLN
500.0000 [IU] | Freq: Once | INTRAVENOUS | Status: AC | PRN
  Administered 2021-06-13: 500 [IU]

## 2021-06-13 MED ORDER — SODIUM CHLORIDE 0.9 % IV SOLN: Freq: Once | INTRAVENOUS | Status: AC

## 2021-06-13 MED ORDER — SODIUM CHLORIDE 0.9% FLUSH
10.0000 mL | Freq: Once | INTRAVENOUS | Status: AC
  Administered 2021-06-13: 10 mL

## 2021-06-13 MED ORDER — SODIUM CHLORIDE 0.9% FLUSH
10.0000 mL | INTRAVENOUS | Status: DC | PRN
  Administered 2021-06-13: 10 mL

## 2021-06-13 NOTE — Progress Notes (Signed)
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   All questions were answered. The patient knows to call the clinic with any problems, questions or concerns. The total time spent in the appointment was 20 minutes encounter with patients including review of chart and various tests results, discussions about plan of care and coordination of care plan   [name], MD 06/13/2021 10:43 AM  INTERVAL HISTORY: Please see below for problem oriented charting. she returns for treatment follow-up on bevacizumab for recurrent fallopian tube cancer She is doing well Her blood pressure at home is well controlled Her chronic diarrhea is stable She was seen by cardiologist recently with plan for repeat echocardiogram The patient denies any recent signs or symptoms of bleeding such as spontaneous epistaxis, hematuria or hematochezia.   REVIEW OF SYSTEMS:   Constitutional: Denies fevers, chills or abnormal weight loss Eyes: Denies blurriness of vision Ears, nose, mouth, throat, and face: Denies mucositis or sore throat Respiratory: Denies cough, dyspnea or wheezes Skin: Denies abnormal skin rashes Lymphatics: Denies new lymphadenopathy or easy bruising Neurological:Denies numbness, tingling or new weaknesses Behavioral/Psych: Mood is stable, no new changes  All other systems were  reviewed with the patient and are negative.  I have reviewed the past medical history, past surgical history, social history and family history with the patient and they are unchanged from previous note.  ALLERGIES:  is allergic to [employer] tartrate], bee venom, and cortisone.  MEDICATIONS:  Current Outpatient Medications  Medication Sig Dispense Refill   amLODipine (NORVASC) 10 MG tablet Take 1 tablet (10 mg total) by mouth daily. 30 tablet 11   Armodafinil 250 MG tablet Take 1 tablet (250 mg total) by mouth every morning. 90 tablet 1   bevacizumab in sodium chloride 0.9 % 100 mL Inject into the vein once.     busPIRone (BUSPAR) 7.5 MG tablet Take 15 mg by mouth 2 (two) times daily.     chlorthalidone (HYGROTON) 25 MG tablet Take 25 mg by mouth daily.     cholecalciferol (VITAMIN D3) 25 MCG (1000 UT) tablet Take 1,000 Units by mouth daily.     colestipol (COLESTID) 1 g tablet Take 0.5-1 g by mouth daily. Pt taking 1/2 to 1 tablet daily depending on meal choice; taken 1 hour after morning meds , and can not eat for another hour     eszopiclone (LUNESTA) 2 MG TABS tablet TAKE ONE TABLET BY MOUTH EVERYDAY AT BEDTIME 90 tablet 1   FIBER PO Take by mouth.     FLUoxetine (PROZAC) 20 MG capsule Take 40 mg by mouth every morning. Takes with a 64m capsule for her total dose of 323m     lidocaine-prilocaine (EMLA) cream Apply 1 application topically as needed. 30 g 1   MELATONIN PO Take 10 mg by mouth at bedtime.      metFORMIN (GLUCOPHAGE-XR) 500 MG 24 hr tablet Take 500 mg by mouth daily  with breakfast.   3   metoprolol tartrate (LOPRESSOR) 25 MG tablet Take 1 tablet (25 mg total) by mouth 2 (two) times daily. 60 tablet 3   modafinil (PROVIGIL) 200 MG tablet Take 1 tablet (200 mg total) by mouth daily. 30 tablet 0   Multiple Vitamin (MULTIVITAMIN WITH MINERALS) TABS Take 1 tablet by mouth every morning.      rosuvastatin (CRESTOR) 40 MG tablet      telmisartan (MICARDIS) 40 MG tablet  Take 40 mg by mouth daily.     No current facility-administered medications for this visit.   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  Interval development of large mid abdominal mass highly concerning for right ovarian cancer.  There is mild omental nodularity on the left, and peritoneal disease cannot be completely excluded.  There is no ascites or other evidence of metastatic disease. 2.  Mild associated renal pelvocaliectasis bilaterally without obstruction.  Nonobstructing left renal calculus and a small right renal angiomyolipoma noted incidentally.   11/08/2012 Pathology Results   1. 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 - BENIGN FALLOPIAN TUBE WITH NONSPECIFIC CHRONIC INFLAMMATION. 2. Ovary and fallopian tube, left - BENIGN OVARY; NEGATIVE FOR ATYPIA OR MALIGNANCY. - BENIGN FALLOPIAN TUBE; NEGATIVE FOR ATYPIA OR MALIGNANCY. 3. Omentum, resection for tumor - HIGH GRADE CARCINOMA, SEE  COMMENT. 4. Appendix, Other than Incidental - FIBROUS OBLITERATION OF APPENDICEAL TIP. - NEGATIVE FOR MALIGNANCY.   11/08/2012 Surgery   Surgery: Exploratory laparotomy, bilateral salpingo-oophorectomy, appendectomy, infacolic omentectomy, optimal debulking   Surgeons:  Paola A. Alycia Rossetti, MD; Lahoma Crocker, MD    Assistant: Caswell Corwin  Pathology: Bilateral fallopian tubes and ovaries to pathology. Appendix as well as omentum. 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   Operative findings: 25 cm right adnexal mass with smooth surface. Surgically absent uterus. Atrophic-appearing left ovary. Normal appearing appendix. Within the omentum there were centimeter nodules scattered throughout the omentum. The remainder of the surfaces were benign.   12/08/2012 Procedure   Impression:  Placement of a subcutaneous port device.  The catheter tip is in the lower SVC and ready to be used.     12/13/2012 - 03/28/2013 Chemotherapy   s/p 6 cycles of paclitaxel and carboplatin   12/13/2012 - 03/28/2013 Chemotherapy   The patient had 6 cycles of carboplatin and Taxol   03/24/2013 Imaging   US abdomen   04/24/2013 Imaging   CT abdomen Interval resection of the large right pelvic and lower abdominal mass lesion with apparent omentectomy.  No evidence  for intraperitoneal free fluid on today's study.  No discernible peritoneal lesions.   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   09/07/2013 Genetic Testing   Patient has genetic testing done for BRCA1/2 panel Results revealed patient has no mutation(s):   07/09/2015 Imaging   CT abdomen 1. 12 mm obstructive calculus at the left ureteropelvic junction with moderate proximal hydronephrosis. 2. 2 small supraumbilical ventral hernias, one containing a short segment of the mid transverse colon and the other containing a short segment of the mid small bowel. There is no  associated evidence to suggest bowel incarceration or obstruction at this time. 3. Tiny locule of gas non dependently in the lumen of the urinary bladder. This is presumably iatrogenic related to recent catheterization for urinalysis. Alternatively, this could be seen in the setting of urinary tract infection with gas-forming organisms. Clinical correlation for history of recent catheterization is recommended. 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 5. Status post cholecystectomy. 6. Additional incidental findings, as above.     12/11/2016 Imaging   Ct abdomen 1. No evidence of metastatic ovarian cancer. 2. Recurrent subxiphoid ventral abdominal wall hernia containing transverse colon. No evidence of incarceration or obstruction. 3. Stable incidental findings in the liver and kidneys. No recurrent urinary tract calculus. 4. Progressive lower lumbar spondylosis. 5.  Aortic Atherosclerosis (ICD10-I70.0).     08/30/2017 Imaging   MRI thoracic spine 1. At T5-6 there is a small central disc protrusion contacting the ventral thoracic spinal cord. No central canal or foraminal stenosis. 2. At T9-10 there is a small right paracentral disc protrusion. 3.  No acute osseous injury of the thoracic spine. 4. No aggressive osseous lesion to suggest metastatic disease.   09/24/2017 Tumor Marker   Patient's tumor was tested for the following markers: CA-125 Results of the tumor marker test revealed 21.7   09/30/2017 Imaging   CT abdomen 1. New small clustered soft tissue nodules in the left lower quadrant in the sigmoid mesentery, largest 1.0 cm, which could represent recurrent peritoneal tumor implants. No ascites.  2. Midline high ventral abdominal wall hernia containing a portion of the transverse colon is mildly increased in size, and without bowel complication at this time. 3. Chronic findings include: Aortic Atherosclerosis (ICD10-I70.0). Diffuse hepatic steatosis. 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   10/11/2017 PET scan   1. Nodules in the sigmoid mesentery are hypermetabolic and highly worrisome for metastatic disease. 2. Attic steatosis.     11/03/2017 Procedure   Successful CT-guided rectus abdominal muscle mass core biopsy. Path: - FOREIGN BODY GIANT CELL REACTION INVOLVING FIBROADIPOSE TISSUE AND SKELETAL MUSCLE. - NO EVIDENCE OF MALIGNANCY.   11/04/2017 Cancer Staging   Staging form: Fallopian Tube, AJCC 7th Edition - Clinical: FIGO Stage IIIC, calculated as Stage IV (rT3, N0, M1) - Signed by [name], MD on 08/09/2019    02/2018 Imaging   CT: 1. Continued increase in size small peritoneal nodules along the mesenteric border of the proximal sigmoid colon as well as along the serosal surface of the proximal sigmoid colon. 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 2. No evidence of distant disease. 3. Stable large ventral hernia.   05/2018 Imaging   PET: 1. Redemonstration of hypermetabolic nodules within the sigmoid mesocolon. Mild response to therapy relative to CT of 02/24/2018. Mixed response to therapy compared to the most recent PET of 10/11/2017. 2. Hypermetabolism within the right pelvic rectus musculature,  increased since the prior PET.  Clinical service requested comparison to the 11/24/2017 CT. Index 10 mm nodule within the sigmoid mesocolon was similar to the 02/24/2018 CT, and as described on that exam, increased from 7 mm on 11/24/2017. More inferior nodule within the mesocolon measures 12 mm today on image 156/4 and 8 mm on 11/24/2017.   05/2018 Imaging   CT: IMPRESSION: 1. Since 02/24/2018, decreased size of peritoneal nodules centered in the sigmoid mesocolon. 2. No evidence of new or progressive disease. 3. Hepatic steatosis. 4. Subcentimeter right renal angiomyolipoma, similar. 5.  Aortic Atherosclerosis (ICD10-I70.0). 6. Ventral  abdominal wall laxity containing transverse colon, similar.   08/2018 Imaging   CT: IMPRESSION: 1. Nodules within the sigmoid mesocolon have decreased in size compared to prior. No new peritoneal or omental nodularity. 2. No evidence local recurrence the pelvis. 3. Ventral hernia contains a segment of transverse colon. No change from prior.     06/19/2019 Imaging   1. No evidence of metastatic disease in the abdomen pelvis. 2. No peritoneal or omental metastasis identified.  No free fluid. 3. Postcholecystectomy and hysterectomy. Comparison exams are made available. Comparison CT 09/08/2018 and 05/27/2018. PET-CT 05/27/2018    There is a nodule within the proximal aspect of the sigmoid colon measuring 2.2 by 2.2 cm. In comparison to prior CTs and PET-CT there was a hypermetabolic nodule at this location on the PET-CT of 08/27/2017 and on the CT of 09/08/2018 there was a small residual nodule. At that time (09/08/2018) the nodule measured 1.4 by 1.3 cm. Therefore this nodule has increased in the interval and concerning for recurrence of a serosal implant. The previous described lymph nodes in the sigmoid mesocolon and more central mesentery are not increased in size and not pathologic by size criteria.    Concern for recurrence of serosal metastasis in the proximal sigmoid colon with a 2 cm enlarging lesion. Lesion extends into the lumen. No evidence of high-grade obstruction. Consider FDG PET scan and/or potential colonoscopy for evaluation.   06/29/2019 PET scan   1. Enlarging serosal implant within the proximal sigmoid colon has intense metabolic activity most consistent with malignancy. Lesion exhibits a somewhat indolent progression as present on PET-CT scan from 10/11/2017 and 05/27/2018. 2. Focal hypermetabolic activity within the RIGHT rectus muscle just off midline is slightly decreased from comparison exam. This may be benign inflammation related to prior laparotomy, however malignancy  not excluded.   07/26/2019 Procedure   She had colonoscopy which showed multiple polyps.  6 polyps were removed from the cecum, measuring 3 to 6 mm in size.  One 12 mm polyp was removed from ascending colon and another 1 measures 7 mm.  One 5 mm polyp was removed from the transverse colon.  There is tumor noted in the sigmoid colon, approximately 35 cm from the anus which was biopsied.  The tumor appeared to be fungating, infiltrative and ulcerated but nonobstructive.  It encompassed approximately one third of the circumference of the lumen.   07/26/2019 Pathology Results   Multiple polyps came back tubular adenomas.  2 ascending polyps came back sessile serrated adenoma months.  Sigmoid colon biopsy confirmed metastatic high-grade serous carcinoma.  The morphology and immunophenotype are consistent with metastatic high-grade serous carcinoma from tubal/ovarian primary site.   08/17/2019 - 12/12/2019 Chemotherapy   The patient had carboplatin and taxol   10/23/2019 Imaging   1. Sigmoid lesion with diminished size/conspicuity difficult to measure on the previous exam. Adjacent lymph nodes and nodularity less  than a cm, largest on coronal image 48 measuring 5 mm. 2. Right rectus muscle with some thickening with mildly convex margin seen posteriorly on image 72 of series 2. This could be due to postsurgical change, however, metastatic disease is not excluded. Attention on follow-up is suggested. 3. No evidence for new metastatic disease. 4. Small hiatal hernia. 5. 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   Aortic Atherosclerosis (ICD10-I70.0).   01/01/2020 Imaging   1. No signs of new disease. 2. Tiny lymph nodes in the area of concern in the LEFT lower quadrant z. 3. Added density and subtle contour irregularity involving the rectus muscles best seen on sagittal images, associated with site of prior surgical incision just to the RIGHT of midline (image 72, series 2 and image 58, series 5. Not  significantly changed compared to prior studies. This measures approximately 2.1 x 1 cm in the sagittal plane and is less well-defined in the axial plane. Potentially postoperative change. Attention on follow-up. 4. Aortic atherosclerosis.   Aortic Atherosclerosis (ICD10-I70.0).       01/29/2020 -  Chemotherapy   Patient is on Treatment Plan : Ovarian Bevacizumab     05/06/2020 Imaging   1. Status post hysterectomy and oophorectomy. 2. No specific findings of recurrent or metastatic disease in the abdomen or pelvis. 3. No change in postoperative appearance of low midline incision. 4. Unchanged small prominent subcentimeter left iliac lymph nodes.   10/22/2020 Imaging   1. Status post hysterectomy and oophorectomy. No evidence of recurrent or metastatic disease within the abdomen or pelvis. 2. No significant change in the subcentimeter prominent left iliac lymph nodes.   04/11/2021 Imaging   1. No findings to suggest recurrent or metastatic disease in the abdomen or pelvis. 2. Aortic atherosclerosis, in addition to at least left anterior descending coronary artery disease. Assessment for potential risk factor modification, dietary therapy or pharmacologic therapy may be warranted, if clinically indicated. 3. Additional incidental findings, as above.     PHYSICAL EXAMINATION: ECOG PERFORMANCE STATUS: 1 - Symptomatic but completely ambulatory  Vitals:   06/13/21 1008  BP: (!) 142/80  Pulse: 74  Resp: 18  Temp: 98.6 F (37 C)  SpO2: 100%   Filed Weights   06/13/21 1008  Weight: 194 lb 12.8 oz (88.4 kg)    GENERAL:alert, no distress and comfortable SKIN: skin color, texture, turgor are normal, no rashes or significant lesions EYES: normal, Conjunctiva are pink and non-injected, sclera clear OROPHARYNX:no exudate, no erythema and lips, buccal mucosa, and tongue normal  NECK: supple, thyroid normal size, non-tender, without nodularity LYMPH:  no palpable lymphadenopathy in the  cervical, axillary or inguinal LUNGS: clear to auscultation and percussion with normal breathing effort HEART: regular rate & rhythm and no murmurs and no lower extremity edema ABDOMEN:abdomen soft, non-tender and normal bowel sounds Musculoskeletal:no cyanosis of digits and no clubbing  NEURO: alert & oriented x 3 with fluent speech, no focal motor/sensory deficits  LABORATORY DATA:  I have reviewed the data as listed    Component Value Date/Time   NA 139 06/13/2021 0955   NA 141 12/02/2016 1219   K 3.7 06/13/2021 0955   K 3.7 12/02/2016 1219   CL 103 06/13/2021 0955   CL 103 01/18/2013 1329   CO2 26 06/13/2021 0955   CO2 27 12/02/2016 1219   GLUCOSE 111 (H) 06/13/2021 0955   GLUCOSE 98 12/02/2016 1219   GLUCOSE 110 (H) 01/18/2013 1329   BUN 13 06/13/2021 0955   BUN  15.3 12/02/2016 1219   CREATININE 0.74 06/13/2021 0955   CREATININE 0.71 05/23/2021 0758   CREATININE 0.7 12/02/2016 1219   CALCIUM 9.6 06/13/2021 0955   CALCIUM 10.1 12/02/2016 1219   PROT 7.0 06/13/2021 0955   PROT 7.1 12/02/2016 1219   ALBUMIN 4.1 06/13/2021 0955   ALBUMIN 4.2 12/02/2016 1219   AST 30 06/13/2021 0955   AST 25 05/23/2021 0758   AST 21 12/02/2016 1219   ALT 32 06/13/2021 0955   ALT 26 05/23/2021 0758   ALT 27 12/02/2016 1219   ALKPHOS 84 06/13/2021 0955   ALKPHOS 104 12/02/2016 1219   BILITOT 0.5 06/13/2021 0955   BILITOT 0.6 05/23/2021 0758   BILITOT 0.37 12/02/2016 1219   GFRNONAA >60 06/13/2021 0955   GFRNONAA >60 05/23/2021 0758   GFRAA >60 05/07/2020 0833    No results found for: SPEP, UPEP  Lab Results  Component Value Date   WBC 5.2 06/13/2021   NEUTROABS 3.1 06/13/2021   HGB 13.9 06/13/2021   HCT 42.0 06/13/2021   MCV 89.9 06/13/2021   PLT 121 (L) 06/13/2021      Chemistry      Component Value Date/Time   NA 139 06/13/2021 0955   NA 141 12/02/2016 1219   K 3.7 06/13/2021 0955   K 3.7 12/02/2016 1219   CL 103 06/13/2021 0955   CL 103 01/18/2013 1329   CO2 26  06/13/2021 0955   CO2 27 12/02/2016 1219   BUN 13 06/13/2021 0955   BUN 15.3 12/02/2016 1219   CREATININE 0.74 06/13/2021 0955   CREATININE 0.71 05/23/2021 0758   CREATININE 0.7 12/02/2016 1219   GLU 236 12/13/2012 1558      Component Value Date/Time   CALCIUM 9.6 06/13/2021 0955   CALCIUM 10.1 12/02/2016 1219   ALKPHOS 84 06/13/2021 0955   ALKPHOS 104 12/02/2016 1219   AST 30 06/13/2021 0955   AST 25 05/23/2021 0758   AST 21 12/02/2016 1219   ALT 32 06/13/2021 0955   ALT 26 05/23/2021 0758   ALT 27 12/02/2016 1219   BILITOT 0.5 06/13/2021 0955   BILITOT 0.6 05/23/2021 0758   BILITOT 0.37 12/02/2016 1219

## 2021-06-13 NOTE — Assessment & Plan Note (Signed)
Her thrombocytopenia is stable Observe only for now 

## 2021-06-13 NOTE — Assessment & Plan Note (Signed)
Her blood pressure is well controlled She will continue close monitoring

## 2021-06-13 NOTE — Assessment & Plan Note (Signed)
Her last CT imaging showed no signs of disease relapse or progression She tolerated bevacizumab well The plan will be continue indefinitely I plan to repeat imaging study again in March 2023

## 2021-06-13 NOTE — Patient Instructions (Signed)
[facility] MEDICAL ONCOLOGY  ° Discharge Instructions: °Thank you for choosing [facility] to provide your oncology and hematology care.  ° °If you have a lab appointment with the Cancer Center, please go directly to the Cancer Center and check in at the registration area. °  °Wear comfortable clothing and clothing appropriate for easy access to any Portacath or PICC line.  ° °We strive to give you quality time with your provider. You may need to reschedule your appointment if you arrive late (15 or more minutes).  Arriving late affects you and other patients whose appointments are after yours.  Also, if you miss three or more appointments without notifying the office, you may be dismissed from the clinic at the provider’s discretion.    °  °For prescription refill requests, have your pharmacy contact our office and allow 72 hours for refills to be completed.   ° °Today you received the following chemotherapy and/or immunotherapy agents: bevacizumab-bvzr.    °  °To help prevent nausea and vomiting after your treatment, we encourage you to take your nausea medication as directed. ° °BELOW ARE SYMPTOMS THAT SHOULD BE REPORTED IMMEDIATELY: °*FEVER GREATER THAN 100.4 F (38 °C) OR HIGHER °*CHILLS OR SWEATING °*NAUSEA AND VOMITING THAT IS NOT CONTROLLED WITH YOUR NAUSEA MEDICATION °*UNUSUAL SHORTNESS OF BREATH °*UNUSUAL BRUISING OR BLEEDING °*URINARY PROBLEMS (pain or burning when urinating, or frequent urination) °*BOWEL PROBLEMS (unusual diarrhea, constipation, pain near the anus) °TENDERNESS IN MOUTH AND THROAT WITH OR WITHOUT PRESENCE OF ULCERS (sore throat, sores in mouth, or a toothache) °UNUSUAL RASH, SWELLING OR PAIN  °UNUSUAL VAGINAL DISCHARGE OR ITCHING  ° °Items with * indicate a potential emergency and should be followed up as soon as possible or go to the Emergency Department if any problems should occur. ° °Please show the CHEMOTHERAPY ALERT CARD or IMMUNOTHERAPY ALERT CARD at  check-in to the Emergency Department and triage nurse. ° °Should you have questions after your visit or need to cancel or reschedule your appointment, please contact [facility] MEDICAL ONCOLOG[facility]  Dept: [phone]  and follow the prompts.  Office hours are 8:00 a.m. to 4:30 p.m. Monday - Friday. Please note that voicemails left after 4:00 p.m. may not be returned until the following business day.  We are closed weekends and major holidays. You have access to a nurse at all times for urgent questions. Please call the main number to the clinic Dept: [phone] and follow the prompts. ° ° °For any non-urgent questions, you may also contact your provider using MyChart. We now offer e-Visits for anyone 18 and older to request care online for non-urgent symptoms. For details visit [email or web address]. °  °Also download the MyChart app! Go to the app store, search "MyChart", open the app, select [facility], and log in with your MyChart username and password. ° °Due to Covid, a mask is required upon entering the hospital/clinic. If you do not have a mask, one will be given to you upon arrival. For doctor visits, patients may have 1 support person aged 18 or older with them. For treatment visits, patients cannot have anyone with them due to current Covid guidelines and our immunocompromised population.  ° °

## 2021-06-18 ENCOUNTER — Other Ambulatory Visit: Payer: Self-pay

## 2021-06-18 ENCOUNTER — Ambulatory Visit (HOSPITAL_COMMUNITY): Payer: Medicare HMO | Attending: Cardiology

## 2021-06-18 DIAGNOSIS — R0602 Shortness of breath: Secondary | ICD-10-CM | POA: Diagnosis not present

## 2021-06-18 LAB — ECHOCARDIOGRAM COMPLETE
Area-P 1/2: 3.1 cm2
S' Lateral: 3.2 cm

## 2021-07-02 DIAGNOSIS — Z1389 Encounter for screening for other disorder: Secondary | ICD-10-CM | POA: Diagnosis not present

## 2021-07-02 DIAGNOSIS — Z0000 Encounter for general adult medical examination without abnormal findings: Secondary | ICD-10-CM | POA: Diagnosis not present

## 2021-07-04 ENCOUNTER — Inpatient Hospital Stay: Payer: Medicare HMO

## 2021-07-04 ENCOUNTER — Other Ambulatory Visit: Payer: Self-pay

## 2021-07-04 VITALS — BP 145/75 | HR 65 | Temp 98.1°F | Resp 17 | Wt 198.0 lb

## 2021-07-04 DIAGNOSIS — C5700 Malignant neoplasm of unspecified fallopian tube: Secondary | ICD-10-CM

## 2021-07-04 DIAGNOSIS — C5701 Malignant neoplasm of right fallopian tube: Secondary | ICD-10-CM

## 2021-07-04 DIAGNOSIS — Z7189 Other specified counseling: Secondary | ICD-10-CM

## 2021-07-04 DIAGNOSIS — Z5112 Encounter for antineoplastic immunotherapy: Secondary | ICD-10-CM | POA: Diagnosis not present

## 2021-07-04 LAB — CBC WITH DIFFERENTIAL/PLATELET
Abs Immature Granulocytes: 0.02 10*3/uL (ref 0.00–0.07)
Basophils Absolute: 0 10*3/uL (ref 0.0–0.1)
Basophils Relative: 0 %
Eosinophils Absolute: 0.1 10*3/uL (ref 0.0–0.5)
Eosinophils Relative: 2 %
HCT: 41.1 % (ref 36.0–46.0)
Hemoglobin: 13.1 g/dL (ref 12.0–15.0)
Immature Granulocytes: 0 %
Lymphocytes Relative: 26 %
Lymphs Abs: 1.5 10*3/uL (ref 0.7–4.0)
MCH: 29.4 pg (ref 26.0–34.0)
MCHC: 31.9 g/dL (ref 30.0–36.0)
MCV: 92.4 fL (ref 80.0–100.0)
Monocytes Absolute: 0.5 10*3/uL (ref 0.1–1.0)
Monocytes Relative: 9 %
Neutro Abs: 3.6 10*3/uL (ref 1.7–7.7)
Neutrophils Relative %: 63 %
Platelets: 120 10*3/uL — ABNORMAL LOW (ref 150–400)
RBC: 4.45 MIL/uL (ref 3.87–5.11)
RDW: 13.8 % (ref 11.5–15.5)
WBC: 5.7 10*3/uL (ref 4.0–10.5)
nRBC: 0 % (ref 0.0–0.2)

## 2021-07-04 LAB — COMPREHENSIVE METABOLIC PANEL
ALT: 29 U/L (ref 0–44)
AST: 22 U/L (ref 15–41)
Albumin: 3.8 g/dL (ref 3.5–5.0)
Alkaline Phosphatase: 81 U/L (ref 38–126)
Anion gap: 11 (ref 5–15)
BUN: 19 mg/dL (ref 8–23)
CO2: 26 mmol/L (ref 22–32)
Calcium: 9.5 mg/dL (ref 8.9–10.3)
Chloride: 108 mmol/L (ref 98–111)
Creatinine, Ser: 0.79 mg/dL (ref 0.44–1.00)
GFR, Estimated: >60 mL/min (ref >60)
Glucose, Bld: 155 mg/dL — ABNORMAL HIGH (ref 70–99)
Potassium: 4 mmol/L (ref 3.5–5.1)
Sodium: 145 mmol/L (ref 135–145)
Total Bilirubin: 0.3 mg/dL (ref 0.3–1.2)
Total Protein: 6.7 g/dL (ref 6.5–8.1)

## 2021-07-04 MED ORDER — SODIUM CHLORIDE 0.9 % IV SOLN
15.0000 mg/kg | Freq: Once | INTRAVENOUS | Status: AC
  Administered 2021-07-04: 1300 mg via INTRAVENOUS
  Filled 2021-07-04: qty 4

## 2021-07-04 MED ORDER — SODIUM CHLORIDE 0.9 % IV SOLN: Freq: Once | INTRAVENOUS | Status: AC

## 2021-07-04 MED ORDER — SODIUM CHLORIDE 0.9% FLUSH
10.0000 mL | Freq: Once | INTRAVENOUS | Status: AC
  Administered 2021-07-04: 10 mL

## 2021-07-04 MED ORDER — HEPARIN SOD (PORK) LOCK FLUSH 100 UNIT/ML IV SOLN
500.0000 [IU] | Freq: Once | INTRAVENOUS | Status: AC | PRN
  Administered 2021-07-04: 500 [IU]

## 2021-07-04 MED ORDER — SODIUM CHLORIDE 0.9% FLUSH
10.0000 mL | INTRAVENOUS | Status: DC | PRN
  Administered 2021-07-04: 10 mL

## 2021-07-04 NOTE — Patient Instructions (Signed)
Bevacizumab injection What is this medication? BEVACIZUMAB (be va SIZ yoo mab) is a monoclonal antibody. It is used to treat many types of cancer. This medicine may be used for other purposes; ask your health care provider or pharmacist if you have questions. COMMON BRAND NAME(S): Alymsys, Avastin, MVASI, Noah Charon What should I tell my care team before I take this medication? They need to know if you have any of these conditions: diabetes heart disease high blood pressure history of coughing up blood prior anthracycline chemotherapy (e.g., doxorubicin, daunorubicin, epirubicin) recent or ongoing radiation therapy recent or planning to have surgery stroke an unusual or allergic reaction to bevacizumab, hamster proteins, mouse proteins, other medicines, foods, dyes, or preservatives pregnant or trying to get pregnant breast-feeding How should I use this medication? This medicine is for infusion into a vein. It is given by a health care professional in a hospital or clinic setting. Talk to your pediatrician regarding the use of this medicine in children. Special care may be needed. Overdosage: If you think you have taken too much of this medicine contact a poison control center or emergency room at once. NOTE: This medicine is only for you. Do not share this medicine with others. What if I miss a dose? It is important not to miss your dose. Call your doctor or health care professional if you are unable to keep an appointment. What may interact with this medication? Interactions are not expected. This list may not describe all possible interactions. Give your health care provider a list of all the medicines, herbs, non-prescription drugs, or dietary supplements you use. Also tell them if you smoke, drink alcohol, or use illegal drugs. Some items may interact with your medicine. What should I watch for while using this medication? Your condition will be monitored carefully while you are  receiving this medicine. You will need important blood work and urine testing done while you are taking this medicine. This medicine may increase your risk to bruise or bleed. Call your doctor or health care professional if you notice any unusual bleeding. Before having surgery, talk to your health care provider to make sure it is ok. This drug can increase the risk of poor healing of your surgical site or wound. You will need to stop this drug for 28 days before surgery. After surgery, wait at least 28 days before restarting this drug. Make sure the surgical site or wound is healed enough before restarting this drug. Talk to your health care provider if questions. Do not become pregnant while taking this medicine or for 6 months after stopping it. Women should inform their doctor if they wish to become pregnant or think they might be pregnant. There is a potential for serious side effects to an unborn child. Talk to your health care professional or pharmacist for more information. Do not breast-feed an infant while taking this medicine and for 6 months after the last dose. This medicine has caused ovarian failure in some women. This medicine may interfere with the ability to have a child. You should talk to your doctor or health care professional if you are concerned about your fertility. What side effects may I notice from receiving this medication? Side effects that you should report to your doctor or health care professional as soon as possible: allergic reactions like skin rash, itching or hives, swelling of the face, lips, or tongue chest pain or chest tightness chills coughing up blood high fever seizures severe constipation signs and symptoms of bleeding  such as bloody or black, tarry stools; red or dark-brown urine; spitting up blood or brown material that looks like coffee grounds; red spots on the skin; unusual bruising or bleeding from the eye, gums, or nose signs and symptoms of a blood  clot such as breathing problems; chest pain; severe, sudden headache; pain, swelling, warmth in the leg signs and symptoms of a stroke like changes in vision; confusion; trouble speaking or understanding; severe headaches; sudden numbness or weakness of the face, arm or leg; trouble walking; dizziness; loss of balance or coordination stomach pain sweating swelling of legs or ankles vomiting weight gain Side effects that usually do not require medical attention (report to your doctor or health care professional if they continue or are bothersome): back pain changes in taste decreased appetite dry skin nausea tiredness This list may not describe all possible side effects. Call your doctor for medical advice about side effects. You may report side effects to FDA at 1-800-FDA-1088. Where should I keep my medication? This drug is given in a hospital or clinic and will not be stored at home. NOTE: This sheet is a summary. It may not cover all possible information. If you have questions about this medicine, talk to your doctor, pharmacist, or health care provider.  2022 [employer] (2021-04-15 00:00:00)

## 2021-07-04 NOTE — Progress Notes (Signed)
Patient tolerated Bevacizumab infusion well, no concerns voiced. Patient discharged. Stable.

## 2021-07-08 ENCOUNTER — Other Ambulatory Visit: Payer: Self-pay | Admitting: Neurology

## 2021-07-08 DIAGNOSIS — E1159 Type 2 diabetes mellitus with other circulatory complications: Secondary | ICD-10-CM | POA: Diagnosis not present

## 2021-07-08 DIAGNOSIS — I2510 Atherosclerotic heart disease of native coronary artery without angina pectoris: Secondary | ICD-10-CM | POA: Diagnosis not present

## 2021-07-08 DIAGNOSIS — E782 Mixed hyperlipidemia: Secondary | ICD-10-CM | POA: Diagnosis not present

## 2021-07-08 DIAGNOSIS — M159 Polyosteoarthritis, unspecified: Secondary | ICD-10-CM | POA: Diagnosis not present

## 2021-07-08 DIAGNOSIS — I10 Essential (primary) hypertension: Secondary | ICD-10-CM | POA: Diagnosis not present

## 2021-07-17 ENCOUNTER — Other Ambulatory Visit: Payer: Self-pay | Admitting: Neurology

## 2021-07-25 ENCOUNTER — Other Ambulatory Visit: Payer: Self-pay

## 2021-07-25 ENCOUNTER — Inpatient Hospital Stay (HOSPITAL_BASED_OUTPATIENT_CLINIC_OR_DEPARTMENT_OTHER): Payer: Medicare HMO | Admitting: Hematology and Oncology

## 2021-07-25 ENCOUNTER — Inpatient Hospital Stay: Payer: Medicare HMO | Attending: Gynecologic Oncology

## 2021-07-25 ENCOUNTER — Encounter: Payer: Self-pay | Admitting: Hematology and Oncology

## 2021-07-25 ENCOUNTER — Inpatient Hospital Stay: Payer: Medicare HMO

## 2021-07-25 DIAGNOSIS — C5700 Malignant neoplasm of unspecified fallopian tube: Secondary | ICD-10-CM

## 2021-07-25 DIAGNOSIS — Z7189 Other specified counseling: Secondary | ICD-10-CM

## 2021-07-25 DIAGNOSIS — Z5112 Encounter for antineoplastic immunotherapy: Secondary | ICD-10-CM | POA: Insufficient documentation

## 2021-07-25 DIAGNOSIS — I10 Essential (primary) hypertension: Secondary | ICD-10-CM

## 2021-07-25 DIAGNOSIS — C5701 Malignant neoplasm of right fallopian tube: Secondary | ICD-10-CM

## 2021-07-25 DIAGNOSIS — D696 Thrombocytopenia, unspecified: Secondary | ICD-10-CM | POA: Diagnosis not present

## 2021-07-25 LAB — CBC WITH DIFFERENTIAL/PLATELET
Abs Immature Granulocytes: 0.01 10*3/uL (ref 0.00–0.07)
Basophils Absolute: 0 10*3/uL (ref 0.0–0.1)
Basophils Relative: 0 %
Eosinophils Absolute: 0.1 10*3/uL (ref 0.0–0.5)
Eosinophils Relative: 2 %
HCT: 41.9 % (ref 36.0–46.0)
Hemoglobin: 13.7 g/dL (ref 12.0–15.0)
Immature Granulocytes: 0 %
Lymphocytes Relative: 34 %
Lymphs Abs: 1.6 10*3/uL (ref 0.7–4.0)
MCH: 29.7 pg (ref 26.0–34.0)
MCHC: 32.7 g/dL (ref 30.0–36.0)
MCV: 90.7 fL (ref 80.0–100.0)
Monocytes Absolute: 0.6 10*3/uL (ref 0.1–1.0)
Monocytes Relative: 12 %
Neutro Abs: 2.5 10*3/uL (ref 1.7–7.7)
Neutrophils Relative %: 52 %
Platelets: 125 10*3/uL — ABNORMAL LOW (ref 150–400)
RBC: 4.62 MIL/uL (ref 3.87–5.11)
RDW: 13.8 % (ref 11.5–15.5)
WBC: 4.8 10*3/uL (ref 4.0–10.5)
nRBC: 0 % (ref 0.0–0.2)

## 2021-07-25 LAB — COMPREHENSIVE METABOLIC PANEL
ALT: 31 U/L (ref 0–44)
AST: 26 U/L (ref 15–41)
Albumin: 4 g/dL (ref 3.5–5.0)
Alkaline Phosphatase: 82 U/L (ref 38–126)
Anion gap: 9 (ref 5–15)
BUN: 16 mg/dL (ref 8–23)
CO2: 25 mmol/L (ref 22–32)
Calcium: 9.3 mg/dL (ref 8.9–10.3)
Chloride: 107 mmol/L (ref 98–111)
Creatinine, Ser: 0.76 mg/dL (ref 0.44–1.00)
GFR, Estimated: >60 mL/min (ref >60)
Glucose, Bld: 113 mg/dL — ABNORMAL HIGH (ref 70–99)
Potassium: 4.1 mmol/L (ref 3.5–5.1)
Sodium: 141 mmol/L (ref 135–145)
Total Bilirubin: 0.5 mg/dL (ref 0.3–1.2)
Total Protein: 7 g/dL (ref 6.5–8.1)

## 2021-07-25 LAB — TOTAL PROTEIN, URINE DIPSTICK: Protein, ur: 30 mg/dL — AB

## 2021-07-25 MED ORDER — SODIUM CHLORIDE 0.9% FLUSH
10.0000 mL | Freq: Once | INTRAVENOUS | Status: AC
  Administered 2021-07-25: 10 mL

## 2021-07-25 MED ORDER — HEPARIN SOD (PORK) LOCK FLUSH 100 UNIT/ML IV SOLN
500.0000 [IU] | Freq: Once | INTRAVENOUS | Status: AC | PRN
  Administered 2021-07-25: 500 [IU]

## 2021-07-25 MED ORDER — SODIUM CHLORIDE 0.9 % IV SOLN: Freq: Once | INTRAVENOUS | Status: AC

## 2021-07-25 MED ORDER — SODIUM CHLORIDE 0.9 % IV SOLN
15.0000 mg/kg | Freq: Once | INTRAVENOUS | Status: AC
  Administered 2021-07-25: 1300 mg via INTRAVENOUS
  Filled 2021-07-25: qty 48

## 2021-07-25 MED ORDER — SODIUM CHLORIDE 0.9% FLUSH
10.0000 mL | INTRAVENOUS | Status: DC | PRN
  Administered 2021-07-25: 10 mL

## 2021-07-25 NOTE — Assessment & Plan Note (Signed)
Her last CT imaging showed no signs of disease relapse or progression She tolerated bevacizumab well The plan will be continue indefinitely I plan to repeat imaging study again in March 2023

## 2021-07-25 NOTE — Assessment & Plan Note (Signed)
Her blood pressure is well controlled She will continue close monitoring

## 2021-07-25 NOTE — Progress Notes (Signed)
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   All questions were answered. The patient knows to call the clinic with any problems, questions or concerns. The total time spent in the appointment was 20 minutes encounter with patients including review of chart and various tests results, discussions about plan of care and coordination of care plan   [name], MD 07/25/2021 5:56 PM  INTERVAL HISTORY: Please see below for problem oriented charting. she returns for treatment follow-up on maintenance bevacizumab She is doing well Her chronic dizziness is stable and she denies fall Her blood pressure is usually stable at home No recent new symptoms such as abdominal pain or bloating  REVIEW OF SYSTEMS:   Constitutional: Denies fevers, chills or abnormal weight loss Eyes: Denies blurriness of vision Ears, nose, mouth, throat, and face: Denies mucositis or sore throat Respiratory: Denies cough, dyspnea or wheezes Cardiovascular: Denies palpitation, chest discomfort or lower extremity swelling Gastrointestinal:  Denies nausea, heartburn or change in bowel habits Skin: Denies abnormal skin rashes Lymphatics: Denies new lymphadenopathy or easy bruising Neurological:Denies numbness, tingling or new weaknesses Behavioral/Psych: Mood is stable, no new changes  All other  systems were reviewed with the patient and are negative.  I have reviewed the past medical history, past surgical history, social history and family history with the patient and they are unchanged from previous note.  ALLERGIES:  is allergic to [employer] tartrate], bee venom, and cortisone.  MEDICATIONS:  Current Outpatient Medications  Medication Sig Dispense Refill   amLODipine (NORVASC) 10 MG tablet Take 1 tablet (10 mg total) by mouth daily. 30 tablet 11   Armodafinil 250 MG tablet TAKE ONE TABLET BY MOUTH EVERY MORNING 90 tablet 1   bevacizumab in sodium chloride 0.9 % 100 mL Inject into the vein once.     busPIRone (BUSPAR) 7.5 MG tablet Take 15 mg by mouth 2 (two) times daily.     chlorthalidone (HYGROTON) 25 MG tablet Take 25 mg by mouth daily.     cholecalciferol (VITAMIN D3) 25 MCG (1000 UT) tablet Take 1,000 Units by mouth daily.     colestipol (COLESTID) 1 g tablet Take 0.5-1 g by mouth daily. Pt taking 1/2 to 1 tablet daily depending on meal choice; taken 1 hour after morning meds , and can not eat for another hour     eszopiclone (LUNESTA) 2 MG TABS tablet TAKE ONE TABLET BY MOUTH EVERYDAY AT BEDTIME 90 tablet 1   FIBER PO Take by mouth.     FLUoxetine (PROZAC) 20 MG capsule Take 40 mg by mouth every morning. Takes with a 22m capsule for her total dose of 318m     lidocaine-prilocaine (EMLA) cream Apply 1 application topically as needed. 30 g 1   MELATONIN PO Take 10 mg by mouth at bedtime.      metFORMIN (GLUCOPHAGE-XR) 500 MG 24 hr tablet Take 500 mg by mouth daily with breakfast.  3   metoprolol tartrate (LOPRESSOR) 25 MG tablet Take 1 tablet (25 mg total) by mouth 2 (two) times daily. 60 tablet 3   modafinil (PROVIGIL) 200 MG tablet TAKE ONE TABLET BY MOUTH EVERY MORNING 30 tablet 0   Multiple Vitamin (MULTIVITAMIN WITH MINERALS) TABS Take 1 tablet by mouth every morning.      rosuvastatin (CRESTOR) 40 MG tablet      telmisartan (MICARDIS) 40 MG tablet Take 40 mg  by mouth daily.     No current facility-administered medications for this visit.   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  Interval development of large mid abdominal mass highly concerning for right ovarian cancer.  There is mild omental nodularity on the left, and peritoneal disease cannot be completely excluded.  There is no ascites or other evidence of metastatic disease. 2.  Mild associated renal pelvocaliectasis bilaterally without obstruction.  Nonobstructing left renal calculus and a small right renal angiomyolipoma noted incidentally.   11/08/2012 Pathology Results   1. 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 - BENIGN FALLOPIAN TUBE WITH NONSPECIFIC CHRONIC INFLAMMATION. 2. Ovary and fallopian tube, left - BENIGN OVARY; NEGATIVE FOR ATYPIA OR MALIGNANCY. - BENIGN FALLOPIAN TUBE; NEGATIVE FOR ATYPIA OR MALIGNANCY. 3. Omentum, resection for tumor - HIGH GRADE CARCINOMA, SEE COMMENT. 4.  Appendix, Other than Incidental - FIBROUS OBLITERATION OF APPENDICEAL TIP. - NEGATIVE FOR MALIGNANCY.   11/08/2012 Surgery   Surgery: Exploratory laparotomy, bilateral salpingo-oophorectomy, appendectomy, infacolic omentectomy, optimal debulking   Surgeons:  Paola A. Alycia Rossetti, MD; Lahoma Crocker, MD    Assistant: Caswell Corwin  Pathology: Bilateral fallopian tubes and ovaries to pathology. Appendix as well as omentum. 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   Operative findings: 25 cm right adnexal mass with smooth surface. Surgically absent uterus. Atrophic-appearing left ovary. Normal appearing appendix. Within the omentum there were centimeter nodules scattered throughout the omentum. The remainder of the surfaces were benign.   12/08/2012 Procedure   Impression:  Placement of a subcutaneous port device.  The catheter tip is in the lower SVC and ready to be used.     12/13/2012 - 03/28/2013 Chemotherapy   s/p 6 cycles of paclitaxel and carboplatin   12/13/2012 - 03/28/2013 Chemotherapy   The patient had 6 cycles of carboplatin and Taxol   03/24/2013 Imaging   US abdomen   04/24/2013 Imaging   CT abdomen Interval resection of the large right pelvic and lower abdominal mass lesion with apparent omentectomy.  No evidence for intraperitoneal free fluid on today's  study.  No discernible peritoneal lesions.   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   09/07/2013 Genetic Testing   Patient has genetic testing done for BRCA1/2 panel Results revealed patient has no mutation(s):   07/09/2015 Imaging   CT abdomen 1. 12 mm obstructive calculus at the left ureteropelvic junction with moderate proximal hydronephrosis. 2. 2 small supraumbilical ventral hernias, one containing a short segment of the mid transverse colon and the other containing a short segment of the mid small bowel. There is no associated  evidence to suggest bowel incarceration or obstruction at this time. 3. Tiny locule of gas non dependently in the lumen of the urinary bladder. This is presumably iatrogenic related to recent catheterization for urinalysis. Alternatively, this could be seen in the setting of urinary tract infection with gas-forming organisms. Clinical correlation for history of recent catheterization is recommended. 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 5. Status post cholecystectomy. 6. Additional incidental findings, as above.     12/11/2016 Imaging   Ct abdomen 1. No evidence of metastatic ovarian cancer. 2. Recurrent subxiphoid ventral abdominal wall hernia containing transverse colon. No evidence of incarceration or obstruction. 3. Stable incidental findings in the liver and kidneys. No recurrent urinary tract calculus. 4. Progressive lower lumbar spondylosis. 5.  Aortic Atherosclerosis (ICD10-I70.0).     08/30/2017 Imaging   MRI thoracic spine 1. At T5-6 there is a small central disc protrusion contacting the ventral thoracic spinal cord. No central canal or foraminal stenosis. 2. At T9-10 there is a small right paracentral disc protrusion. 3.  No acute osseous injury of the thoracic spine. 4. No aggressive osseous lesion to suggest metastatic disease.   09/24/2017 Tumor Marker   Patient's tumor was tested for the following markers: CA-125 Results of the tumor marker test revealed 21.7   09/30/2017 Imaging   CT abdomen 1. New small clustered soft tissue nodules in the left lower quadrant in the sigmoid mesentery, largest 1.0 cm, which could represent recurrent peritoneal tumor implants. No ascites.  2. Midline high ventral abdominal wall hernia containing a portion of the transverse colon is mildly increased in size, and without bowel complication at this time. 3. Chronic findings include: Aortic Atherosclerosis (ICD10-I70.0). Diffuse hepatic steatosis. 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 Small right renal angiomyolipoma.   10/11/2017 PET scan   1. Nodules in the sigmoid mesentery are hypermetabolic and highly worrisome for metastatic disease. 2. Attic steatosis.     11/03/2017 Procedure   Successful CT-guided rectus abdominal muscle mass core biopsy. Path: - FOREIGN BODY GIANT CELL REACTION INVOLVING FIBROADIPOSE TISSUE AND SKELETAL MUSCLE. - NO EVIDENCE OF MALIGNANCY.   11/04/2017 Cancer Staging   Staging form: Fallopian Tube, AJCC 7th Edition - Clinical: FIGO Stage IIIC, calculated as Stage IV (rT3, N0, M1) - Signed by [name], MD on 08/09/2019    02/2018 Imaging   CT: 1. Continued increase in size small peritoneal nodules along the mesenteric border of the proximal sigmoid colon as well as along the serosal surface of the proximal sigmoid colon. 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 2. No evidence of distant disease. 3. Stable large ventral hernia.   05/2018 Imaging   PET: 1. Redemonstration of hypermetabolic nodules within the sigmoid mesocolon. Mild response to therapy relative to CT of 02/24/2018. Mixed response to therapy compared to the most recent PET of 10/11/2017. 2. Hypermetabolism within the right pelvic rectus musculature, increased since the prior PET.  Clinical service requested comparison to the 11/24/2017 CT. Index 10 mm nodule within the sigmoid mesocolon was similar to the 02/24/2018 CT, and as described on that exam, increased from 7 mm on 11/24/2017. More inferior nodule within the mesocolon measures 12 mm today on image 156/4 and 8 mm on 11/24/2017.   05/2018 Imaging   CT: IMPRESSION: 1. Since 02/24/2018, decreased size of peritoneal nodules centered in the sigmoid mesocolon. 2. No evidence of new or progressive disease. 3. Hepatic steatosis. 4. Subcentimeter right renal angiomyolipoma, similar. 5.  Aortic Atherosclerosis (ICD10-I70.0). 6. Ventral abdominal  wall laxity containing transverse colon, similar.   08/2018 Imaging   CT: IMPRESSION: 1. Nodules within the sigmoid mesocolon have decreased in size compared to prior. No new peritoneal or omental nodularity. 2. No evidence local recurrence the pelvis. 3. Ventral hernia contains a segment of transverse colon. No change from prior.     06/19/2019 Imaging   1. No evidence of metastatic disease in the abdomen pelvis. 2. No peritoneal or omental metastasis identified.  No free fluid. 3. Postcholecystectomy and hysterectomy. Comparison exams are made available. Comparison CT 09/08/2018 and 05/27/2018. PET-CT 05/27/2018    There is a nodule within the proximal aspect of the sigmoid colon measuring 2.2 by 2.2 cm. In comparison to prior CTs and PET-CT there was a hypermetabolic nodule at this location on the PET-CT of 08/27/2017 and on the CT of 09/08/2018 there was a small residual nodule. At that time (09/08/2018) the nodule measured 1.4 by 1.3 cm. Therefore this nodule has increased in the interval and concerning for recurrence of a serosal implant. The previous described lymph nodes in the sigmoid mesocolon and more central mesentery are not increased in size and not pathologic by size criteria.    Concern for recurrence of serosal metastasis in the proximal sigmoid colon with a 2 cm enlarging lesion. Lesion extends into the lumen. No evidence of high-grade obstruction. Consider FDG PET scan and/or potential colonoscopy for evaluation.   06/29/2019 PET scan   1. Enlarging serosal implant within the proximal sigmoid colon has intense metabolic activity most consistent with malignancy. Lesion exhibits a somewhat indolent progression as present on PET-CT scan from 10/11/2017 and 05/27/2018. 2. Focal hypermetabolic activity within the RIGHT rectus muscle just off midline is slightly decreased from comparison exam. This may be benign inflammation related to prior laparotomy, however malignancy not  excluded.   07/26/2019 Procedure   She had colonoscopy which showed multiple polyps.  6 polyps were removed from the cecum, measuring 3 to 6 mm in size.  One 12 mm polyp was removed from ascending colon and another 1 measures 7 mm.  One 5 mm polyp was removed from the transverse colon.  There is tumor noted in the sigmoid colon, approximately 35 cm from the anus which was biopsied.  The tumor appeared to be fungating, infiltrative and ulcerated but nonobstructive.  It encompassed approximately one third of the circumference of the lumen.   07/26/2019 Pathology Results   Multiple polyps came back tubular adenomas.  2 ascending polyps came back sessile serrated adenoma months.  Sigmoid colon biopsy confirmed metastatic high-grade serous carcinoma.  The morphology and immunophenotype are consistent with metastatic high-grade serous carcinoma from tubal/ovarian primary site.   08/17/2019 - 12/12/2019 Chemotherapy   The patient had carboplatin and taxol   10/23/2019 Imaging   1. Sigmoid lesion with diminished size/conspicuity difficult to measure on the previous exam. Adjacent lymph nodes and nodularity less than a cm, largest on coronal  image 48 measuring 5 mm. 2. Right rectus muscle with some thickening with mildly convex margin seen posteriorly on image 72 of series 2. This could be due to postsurgical change, however, metastatic disease is not excluded. Attention on follow-up is suggested. 3. No evidence for new metastatic disease. 4. Small hiatal hernia. 5. 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   Aortic Atherosclerosis (ICD10-I70.0).   01/01/2020 Imaging   1. No signs of new disease. 2. Tiny lymph nodes in the area of concern in the LEFT lower quadrant z. 3. Added density and subtle contour irregularity involving the rectus muscles best seen on sagittal images, associated with site of prior surgical incision just to the RIGHT of midline (image 72, series 2 and image 58, series 5. Not  significantly changed compared to prior studies. This measures approximately 2.1 x 1 cm in the sagittal plane and is less well-defined in the axial plane. Potentially postoperative change. Attention on follow-up. 4. Aortic atherosclerosis.   Aortic Atherosclerosis (ICD10-I70.0).       01/29/2020 -  Chemotherapy   Patient is on Treatment Plan : Ovarian Bevacizumab     05/06/2020 Imaging   1. Status post hysterectomy and oophorectomy. 2. No specific findings of recurrent or metastatic disease in the abdomen or pelvis. 3. No change in postoperative appearance of low midline incision. 4. Unchanged small prominent subcentimeter left iliac lymph nodes.   10/22/2020 Imaging   1. Status post hysterectomy and oophorectomy. No evidence of recurrent or metastatic disease within the abdomen or pelvis. 2. No significant change in the subcentimeter prominent left iliac lymph nodes.   04/11/2021 Imaging   1. No findings to suggest recurrent or metastatic disease in the abdomen or pelvis. 2. Aortic atherosclerosis, in addition to at least left anterior descending coronary artery disease. Assessment for potential risk factor modification, dietary therapy or pharmacologic therapy may be warranted, if clinically indicated. 3. Additional incidental findings, as above.     PHYSICAL EXAMINATION: ECOG PERFORMANCE STATUS: 1 - Symptomatic but completely ambulatory  Vitals:   07/25/21 0854  BP: 136/78  Pulse: 69  Resp: 18  Temp: 98.8 F (37.1 C)  SpO2: 98%   Filed Weights   07/25/21 0854  Weight: 194 lb 6.4 oz (88.2 kg)    GENERAL:alert, no distress and comfortable SKIN: skin color, texture, turgor are normal, no rashes or significant lesions EYES: normal, Conjunctiva are pink and non-injected, sclera clear OROPHARYNX:no exudate, no erythema and lips, buccal mucosa, and tongue normal  NECK: supple, thyroid normal size, non-tender, without nodularity LYMPH:  no palpable lymphadenopathy in the  cervical, axillary or inguinal LUNGS: clear to auscultation and percussion with normal breathing effort HEART: regular rate & rhythm and no murmurs and no lower extremity edema ABDOMEN:abdomen soft, non-tender and normal bowel sounds Musculoskeletal:no cyanosis of digits and no clubbing  NEURO: alert & oriented x 3 with fluent speech, no focal motor/sensory deficits  LABORATORY DATA:  I have reviewed the data as listed    Component Value Date/Time   NA 141 07/25/2021 0817   NA 141 12/02/2016 1219   K 4.1 07/25/2021 0817   K 3.7 12/02/2016 1219   CL 107 07/25/2021 0817   CL 103 01/18/2013 1329   CO2 25 07/25/2021 0817   CO2 27 12/02/2016 1219   GLUCOSE 113 (H) 07/25/2021 0817   GLUCOSE 98 12/02/2016 1219   GLUCOSE 110 (H) 01/18/2013 1329   BUN 16 07/25/2021 0817   BUN 15.3 12/02/2016 1219   CREATININE 0.76  07/25/2021 0817   CREATININE 0.71 05/23/2021 0758   CREATININE 0.7 12/02/2016 1219   CALCIUM 9.3 07/25/2021 0817   CALCIUM 10.1 12/02/2016 1219   PROT 7.0 07/25/2021 0817   PROT 7.1 12/02/2016 1219   ALBUMIN 4.0 07/25/2021 0817   ALBUMIN 4.2 12/02/2016 1219   AST 26 07/25/2021 0817   AST 25 05/23/2021 0758   AST 21 12/02/2016 1219   ALT 31 07/25/2021 0817   ALT 26 05/23/2021 0758   ALT 27 12/02/2016 1219   ALKPHOS 82 07/25/2021 0817   ALKPHOS 104 12/02/2016 1219   BILITOT 0.5 07/25/2021 0817   BILITOT 0.6 05/23/2021 0758   BILITOT 0.37 12/02/2016 1219   GFRNONAA >60 07/25/2021 0817   GFRNONAA >60 05/23/2021 0758   GFRAA >60 05/07/2020 0833    No results found for: SPEP, UPEP  Lab Results  Component Value Date   WBC 4.8 07/25/2021   NEUTROABS 2.5 07/25/2021   HGB 13.7 07/25/2021   HCT 41.9 07/25/2021   MCV 90.7 07/25/2021   PLT 125 (L) 07/25/2021      Chemistry      Component Value Date/Time   NA 141 07/25/2021 0817   NA 141 12/02/2016 1219   K 4.1 07/25/2021 0817   K 3.7 12/02/2016 1219   CL 107 07/25/2021 0817   CL 103 01/18/2013 1329   CO2 25  07/25/2021 0817   CO2 27 12/02/2016 1219   BUN 16 07/25/2021 0817   BUN 15.3 12/02/2016 1219   CREATININE 0.76 07/25/2021 0817   CREATININE 0.71 05/23/2021 0758   CREATININE 0.7 12/02/2016 1219   GLU 236 12/13/2012 1558      Component Value Date/Time   CALCIUM 9.3 07/25/2021 0817   CALCIUM 10.1 12/02/2016 1219   ALKPHOS 82 07/25/2021 0817   ALKPHOS 104 12/02/2016 1219   AST 26 07/25/2021 0817   AST 25 05/23/2021 0758   AST 21 12/02/2016 1219   ALT 31 07/25/2021 0817   ALT 26 05/23/2021 0758   ALT 27 12/02/2016 1219   BILITOT 0.5 07/25/2021 0817   BILITOT 0.6 05/23/2021 0758   BILITOT 0.37 12/02/2016 1219

## 2021-07-25 NOTE — Patient Instructions (Signed)
[facility] MEDICAL ONCOLOGY  ° Discharge Instructions: °Thank you for choosing [facility] to provide your oncology and hematology care.  ° °If you have a lab appointment with the Cancer Center, please go directly to the Cancer Center and check in at the registration area. °  °Wear comfortable clothing and clothing appropriate for easy access to any Portacath or PICC line.  ° °We strive to give you quality time with your provider. You may need to reschedule your appointment if you arrive late (15 or more minutes).  Arriving late affects you and other patients whose appointments are after yours.  Also, if you miss three or more appointments without notifying the office, you may be dismissed from the clinic at the provider’s discretion.    °  °For prescription refill requests, have your pharmacy contact our office and allow 72 hours for refills to be completed.   ° °Today you received the following chemotherapy and/or immunotherapy agents: Bevacizumab (Avastin)    °  °To help prevent nausea and vomiting after your treatment, we encourage you to take your nausea medication as directed. ° °BELOW ARE SYMPTOMS THAT SHOULD BE REPORTED IMMEDIATELY: °*FEVER GREATER THAN 100.4 F (38 °C) OR HIGHER °*CHILLS OR SWEATING °*NAUSEA AND VOMITING THAT IS NOT CONTROLLED WITH YOUR NAUSEA MEDICATION °*UNUSUAL SHORTNESS OF BREATH °*UNUSUAL BRUISING OR BLEEDING °*URINARY PROBLEMS (pain or burning when urinating, or frequent urination) °*BOWEL PROBLEMS (unusual diarrhea, constipation, pain near the anus) °TENDERNESS IN MOUTH AND THROAT WITH OR WITHOUT PRESENCE OF ULCERS (sore throat, sores in mouth, or a toothache) °UNUSUAL RASH, SWELLING OR PAIN  °UNUSUAL VAGINAL DISCHARGE OR ITCHING  ° °Items with * indicate a potential emergency and should be followed up as soon as possible or go to the Emergency Department if any problems should occur. ° °Please show the CHEMOTHERAPY ALERT CARD or IMMUNOTHERAPY ALERT CARD  at check-in to the Emergency Department and triage nurse. ° °Should you have questions after your visit or need to cancel or reschedule your appointment, please contact [facility]ICAL ONCOLOG[facility]  Dept: [phone]  and follow the prompts.  Office hours are 8:00 a.m. to 4:30 p.m. Monday - Friday. Please note that voicemails left after 4:00 p.m. may not be returned until the following business day.  We are closed weekends and major holidays. You have access to a nurse at all times for urgent questions. Please call the main number to the clinic Dept: [phone] and follow the prompts. ° ° °For any non-urgent questions, you may also contact your provider using MyChart. We now offer e-Visits for anyone 18 and older to request care online for non-urgent symptoms. For details visit [email or web address]. °  °Also download the MyChart app! Go to the app store, search "MyChart", open the app, select [facility], and log in with your MyChart username and password. ° °Due to Covid, a mask is required upon entering the hospital/clinic. If you do not have a mask, one will be given to you upon arrival. For doctor visits, patients may have 1 support person aged 18 or older with them. For treatment visits, patients cannot have anyone with them due to current Covid guidelines and our immunocompromised population.  ° °

## 2021-07-25 NOTE — Assessment & Plan Note (Signed)
Her thrombocytopenia is stable Observe only for now 

## 2021-07-30 DIAGNOSIS — K9089 Other intestinal malabsorption: Secondary | ICD-10-CM | POA: Diagnosis not present

## 2021-07-30 DIAGNOSIS — R197 Diarrhea, unspecified: Secondary | ICD-10-CM | POA: Diagnosis not present

## 2021-07-30 DIAGNOSIS — C5700 Malignant neoplasm of unspecified fallopian tube: Secondary | ICD-10-CM | POA: Diagnosis not present

## 2021-07-30 DIAGNOSIS — Z86010 Personal history of colonic polyps: Secondary | ICD-10-CM | POA: Diagnosis not present

## 2021-08-08 ENCOUNTER — Other Ambulatory Visit: Payer: Self-pay | Admitting: Neurology

## 2021-08-12 NOTE — Telephone Encounter (Signed)
Received refill request for modafinil.  Last OV was on 09/24/20.  Next OV is pending to be scheduled.  Last RX was written on 07/17/21 for 30 tabs.   [facility] [facility] Database has been reviewed.

## 2021-08-14 DIAGNOSIS — K9089 Other intestinal malabsorption: Secondary | ICD-10-CM | POA: Diagnosis not present

## 2021-08-14 DIAGNOSIS — R197 Diarrhea, unspecified: Secondary | ICD-10-CM | POA: Diagnosis not present

## 2021-08-15 ENCOUNTER — Inpatient Hospital Stay: Payer: Medicare HMO

## 2021-08-15 ENCOUNTER — Inpatient Hospital Stay: Payer: Medicare HMO | Attending: Gynecologic Oncology

## 2021-08-15 ENCOUNTER — Other Ambulatory Visit: Payer: Self-pay

## 2021-08-15 VITALS — BP 130/60 | HR 65 | Temp 98.5°F | Resp 18 | Ht 59.0 in | Wt 200.5 lb

## 2021-08-15 DIAGNOSIS — C5701 Malignant neoplasm of right fallopian tube: Secondary | ICD-10-CM | POA: Diagnosis not present

## 2021-08-15 DIAGNOSIS — Z5112 Encounter for antineoplastic immunotherapy: Secondary | ICD-10-CM | POA: Diagnosis not present

## 2021-08-15 DIAGNOSIS — C5700 Malignant neoplasm of unspecified fallopian tube: Secondary | ICD-10-CM

## 2021-08-15 DIAGNOSIS — Z7189 Other specified counseling: Secondary | ICD-10-CM

## 2021-08-15 LAB — CBC WITH DIFFERENTIAL/PLATELET
Abs Immature Granulocytes: 0.02 10*3/uL (ref 0.00–0.07)
Basophils Absolute: 0 10*3/uL (ref 0.0–0.1)
Basophils Relative: 1 %
Eosinophils Absolute: 0.1 10*3/uL (ref 0.0–0.5)
Eosinophils Relative: 3 %
HCT: 39.3 % (ref 36.0–46.0)
Hemoglobin: 13.2 g/dL (ref 12.0–15.0)
Immature Granulocytes: 0 %
Lymphocytes Relative: 31 %
Lymphs Abs: 1.5 10*3/uL (ref 0.7–4.0)
MCH: 30.3 pg (ref 26.0–34.0)
MCHC: 33.6 g/dL (ref 30.0–36.0)
MCV: 90.3 fL (ref 80.0–100.0)
Monocytes Absolute: 0.5 10*3/uL (ref 0.1–1.0)
Monocytes Relative: 10 %
Neutro Abs: 2.6 10*3/uL (ref 1.7–7.7)
Neutrophils Relative %: 55 %
Platelets: 138 10*3/uL — ABNORMAL LOW (ref 150–400)
RBC: 4.35 MIL/uL (ref 3.87–5.11)
RDW: 13.9 % (ref 11.5–15.5)
WBC: 4.7 10*3/uL (ref 4.0–10.5)
nRBC: 0 % (ref 0.0–0.2)

## 2021-08-15 LAB — TOTAL PROTEIN, URINE DIPSTICK: Protein, ur: NEGATIVE mg/dL

## 2021-08-15 LAB — COMPREHENSIVE METABOLIC PANEL
ALT: 25 U/L (ref 0–44)
AST: 21 U/L (ref 15–41)
Albumin: 4.1 g/dL (ref 3.5–5.0)
Alkaline Phosphatase: 76 U/L (ref 38–126)
Anion gap: 7 (ref 5–15)
BUN: 20 mg/dL (ref 8–23)
CO2: 26 mmol/L (ref 22–32)
Calcium: 9.6 mg/dL (ref 8.9–10.3)
Chloride: 105 mmol/L (ref 98–111)
Creatinine, Ser: 0.77 mg/dL (ref 0.44–1.00)
GFR, Estimated: >60 mL/min (ref >60)
Glucose, Bld: 114 mg/dL — ABNORMAL HIGH (ref 70–99)
Potassium: 4.2 mmol/L (ref 3.5–5.1)
Sodium: 138 mmol/L (ref 135–145)
Total Bilirubin: 0.6 mg/dL (ref 0.3–1.2)
Total Protein: 6.7 g/dL (ref 6.5–8.1)

## 2021-08-15 MED ORDER — SODIUM CHLORIDE 0.9 % IV SOLN: Freq: Once | INTRAVENOUS | Status: AC

## 2021-08-15 MED ORDER — HEPARIN SOD (PORK) LOCK FLUSH 100 UNIT/ML IV SOLN
500.0000 [IU] | Freq: Once | INTRAVENOUS | Status: AC | PRN
  Administered 2021-08-15: 500 [IU]

## 2021-08-15 MED ORDER — SODIUM CHLORIDE 0.9 % IV SOLN
15.0000 mg/kg | Freq: Once | INTRAVENOUS | Status: AC
  Administered 2021-08-15: 1300 mg via INTRAVENOUS
  Filled 2021-08-15: qty 32

## 2021-08-15 MED ORDER — SODIUM CHLORIDE 0.9% FLUSH
10.0000 mL | Freq: Once | INTRAVENOUS | Status: AC
  Administered 2021-08-15: 10 mL

## 2021-08-15 MED ORDER — SODIUM CHLORIDE 0.9% FLUSH
10.0000 mL | INTRAVENOUS | Status: DC | PRN
  Administered 2021-08-15: 10 mL

## 2021-08-15 NOTE — Patient Instructions (Signed)
[facility]  ° Discharge Instructions: °Thank you for choosing [facility] to provide your oncology and hematology care.  ° °If you have a lab appointment with the Cancer Center, please go directly to the Cancer Center and check in at the registration area. °  °Wear comfortable clothing and clothing appropriate for easy access to any Portacath or PICC line.  ° °We strive to give you quality time with your provider. You may need to reschedule your appointment if you arrive late (15 or more minutes).  Arriving late affects you and other patients whose appointments are after yours.  Also, if you miss three or more appointments without notifying the office, you may be dismissed from the clinic at the provider’s discretion.    °  °For prescription refill requests, have your pharmacy contact our office and allow 72 hours for refills to be completed.   ° °Today you received the following chemotherapy and/or immunotherapy agents: Bevacizumab (Avastin)    °  °To help prevent nausea and vomiting after your treatment, we encourage you to take your nausea medication as directed. ° °BELOW ARE SYMPTOMS THAT SHOULD BE REPORTED IMMEDIATELY: °*FEVER GREATER THAN 100.4 F (38 °C) OR HIGHER °*CHILLS OR SWEATING °*NAUSEA AND VOMITING THAT IS NOT CONTROLLED WITH YOUR NAUSEA MEDICATION °*UNUSUAL SHORTNESS OF BREATH °*UNUSUAL BRUISING OR BLEEDING °*URINARY PROBLEMS (pain or burning when urinating, or frequent urination) °*BOWEL PROBLEMS (unusual diarrhea, constipation, pain near the anus) °TENDERNESS IN MOUTH AND THROAT WITH OR WITHOUT PRESENCE OF ULCERS (sore throat, sores in mouth, or a toothache) °UNUSUAL RASH, SWELLING OR PAIN  °UNUSUAL VAGINAL DISCHARGE OR ITCHING  ° °Items with * indicate a potential emergency and should be followed up as soon as possible or go to the Emergency Department if any problems should occur. ° °Please show the CHEMOTHERAPY ALERT CARD or IMMUNOTHERAPY ALERT CARD  at check-in to the Emergency Department and triage nurse. ° °Should you have questions after your visit or need to cancel or reschedule your appointment, please contact [facility]  Dept: [phone]  and follow the prompts.  Office hours are 8:00 a.m. to 4:30 p.m. Monday - Friday. Please note that voicemails left after 4:00 p.m. may not be returned until the following business day.  We are closed weekends and major holidays. You have access to a nurse at all times for urgent questions. Please call the main number to the clinic Dept: [phone] and follow the prompts. ° ° °For any non-urgent questions, you may also contact your provider using MyChart. We now offer e-Visits for anyone 18 and older to request care online for non-urgent symptoms. For details visit [email or web address]. °  °Also download the MyChart app! Go to the app store, search "MyChart", open the app, select [facility], and log in with your MyChart username and password. ° °Due to Covid, a mask is required upon entering the hospital/clinic. If you do not have a mask, one will be given to you upon arrival. For doctor visits, patients may have 1 support person aged 18 or older with them. For treatment visits, patients cannot have anyone with them due to current Covid guidelines and our immunocompromised population.  ° °

## 2021-09-03 ENCOUNTER — Telehealth: Payer: Self-pay

## 2021-09-03 ENCOUNTER — Telehealth: Payer: Self-pay | Admitting: Hematology and Oncology

## 2021-09-03 NOTE — Telephone Encounter (Signed)
R/s per 1/24 pt req inbasket, left pt msg

## 2021-09-03 NOTE — Telephone Encounter (Signed)
Pt called and LVM asking if we could move her appts back to original date of 1/27, stating her plan for a trip to [place] has changed. Message sent to out scheduler to see if this can be changed. Attempted to call pt, LVM for return call .

## 2021-09-05 ENCOUNTER — Ambulatory Visit: Payer: Medicare HMO | Admitting: Hematology and Oncology

## 2021-09-05 ENCOUNTER — Inpatient Hospital Stay: Payer: Medicare HMO | Admitting: Hematology and Oncology

## 2021-09-05 ENCOUNTER — Inpatient Hospital Stay: Payer: Medicare HMO

## 2021-09-05 ENCOUNTER — Other Ambulatory Visit: Payer: Self-pay

## 2021-09-05 VITALS — BP 129/68 | HR 62 | Temp 98.6°F | Resp 16 | Wt 194.6 lb

## 2021-09-05 DIAGNOSIS — Z7189 Other specified counseling: Secondary | ICD-10-CM

## 2021-09-05 DIAGNOSIS — C5700 Malignant neoplasm of unspecified fallopian tube: Secondary | ICD-10-CM

## 2021-09-05 DIAGNOSIS — C5701 Malignant neoplasm of right fallopian tube: Secondary | ICD-10-CM | POA: Diagnosis not present

## 2021-09-05 DIAGNOSIS — Z5112 Encounter for antineoplastic immunotherapy: Secondary | ICD-10-CM | POA: Diagnosis not present

## 2021-09-05 LAB — COMPREHENSIVE METABOLIC PANEL
ALT: 23 U/L (ref 0–44)
AST: 22 U/L (ref 15–41)
Albumin: 4.5 g/dL (ref 3.5–5.0)
Alkaline Phosphatase: 64 U/L (ref 38–126)
Anion gap: 8 (ref 5–15)
BUN: 21 mg/dL (ref 8–23)
CO2: 27 mmol/L (ref 22–32)
Calcium: 9.9 mg/dL (ref 8.9–10.3)
Chloride: 105 mmol/L (ref 98–111)
Creatinine, Ser: 0.84 mg/dL (ref 0.44–1.00)
GFR, Estimated: >60 mL/min (ref >60)
Glucose, Bld: 102 mg/dL — ABNORMAL HIGH (ref 70–99)
Potassium: 4 mmol/L (ref 3.5–5.1)
Sodium: 140 mmol/L (ref 135–145)
Total Bilirubin: 0.5 mg/dL (ref 0.3–1.2)
Total Protein: 7.1 g/dL (ref 6.5–8.1)

## 2021-09-05 LAB — CBC WITH DIFFERENTIAL/PLATELET
Abs Immature Granulocytes: 0 10*3/uL (ref 0.00–0.07)
Basophils Absolute: 0 10*3/uL (ref 0.0–0.1)
Basophils Relative: 1 %
Eosinophils Absolute: 0.1 10*3/uL (ref 0.0–0.5)
Eosinophils Relative: 2 %
HCT: 41.9 % (ref 36.0–46.0)
Hemoglobin: 13.8 g/dL (ref 12.0–15.0)
Immature Granulocytes: 0 %
Lymphocytes Relative: 29 %
Lymphs Abs: 1.7 10*3/uL (ref 0.7–4.0)
MCH: 29.8 pg (ref 26.0–34.0)
MCHC: 32.9 g/dL (ref 30.0–36.0)
MCV: 90.5 fL (ref 80.0–100.0)
Monocytes Absolute: 0.5 10*3/uL (ref 0.1–1.0)
Monocytes Relative: 9 %
Neutro Abs: 3.5 10*3/uL (ref 1.7–7.7)
Neutrophils Relative %: 59 %
Platelets: 144 10*3/uL — ABNORMAL LOW (ref 150–400)
RBC: 4.63 MIL/uL (ref 3.87–5.11)
RDW: 13.8 % (ref 11.5–15.5)
WBC: 5.8 10*3/uL (ref 4.0–10.5)
nRBC: 0 % (ref 0.0–0.2)

## 2021-09-05 LAB — TOTAL PROTEIN, URINE DIPSTICK: Protein, ur: 30 mg/dL — AB

## 2021-09-05 MED ORDER — SODIUM CHLORIDE 0.9% FLUSH
10.0000 mL | Freq: Once | INTRAVENOUS | Status: AC
  Administered 2021-09-05: 10 mL

## 2021-09-05 MED ORDER — SODIUM CHLORIDE 0.9 % IV SOLN
15.0000 mg/kg | Freq: Once | INTRAVENOUS | Status: AC
  Administered 2021-09-05: 1300 mg via INTRAVENOUS
  Filled 2021-09-05: qty 48

## 2021-09-05 MED ORDER — SODIUM CHLORIDE 0.9% FLUSH
10.0000 mL | INTRAVENOUS | Status: DC | PRN
  Administered 2021-09-05: 10 mL

## 2021-09-05 MED ORDER — SODIUM CHLORIDE 0.9 % IV SOLN: Freq: Once | INTRAVENOUS | Status: AC

## 2021-09-05 MED ORDER — HEPARIN SOD (PORK) LOCK FLUSH 100 UNIT/ML IV SOLN
500.0000 [IU] | Freq: Once | INTRAVENOUS | Status: AC | PRN
  Administered 2021-09-05: 500 [IU]

## 2021-09-05 NOTE — Patient Instructions (Signed)
[facility]  ° Discharge Instructions: °Thank you for choosing [facility] to provide your oncology and hematology care.  ° °If you have a lab appointment with the Cancer Center, please go directly to the Cancer Center and check in at the registration area. °  °Wear comfortable clothing and clothing appropriate for easy access to any Portacath or PICC line.  ° °We strive to give you quality time with your provider. You may need to reschedule your appointment if you arrive late (15 or more minutes).  Arriving late affects you and other patients whose appointments are after yours.  Also, if you miss three or more appointments without notifying the office, you may be dismissed from the clinic at the provider’s discretion.    °  °For prescription refill requests, have your pharmacy contact our office and allow 72 hours for refills to be completed.   ° °Today you received the following chemotherapy and/or immunotherapy agents: Bevacizumab (Avastin)    °  °To help prevent nausea and vomiting after your treatment, we encourage you to take your nausea medication as directed. ° °BELOW ARE SYMPTOMS THAT SHOULD BE REPORTED IMMEDIATELY: °*FEVER GREATER THAN 100.4 F (38 °C) OR HIGHER °*CHILLS OR SWEATING °*NAUSEA AND VOMITING THAT IS NOT CONTROLLED WITH YOUR NAUSEA MEDICATION °*UNUSUAL SHORTNESS OF BREATH °*UNUSUAL BRUISING OR BLEEDING °*URINARY PROBLEMS (pain or burning when urinating, or frequent urination) °*BOWEL PROBLEMS (unusual diarrhea, constipation, pain near the anus) °TENDERNESS IN MOUTH AND THROAT WITH OR WITHOUT PRESENCE OF ULCERS (sore throat, sores in mouth, or a toothache) °UNUSUAL RASH, SWELLING OR PAIN  °UNUSUAL VAGINAL DISCHARGE OR ITCHING  ° °Items with * indicate a potential emergency and should be followed up as soon as possible or go to the Emergency Department if any problems should occur. ° °Please show the CHEMOTHERAPY ALERT CARD or IMMUNOTHERAPY ALERT CARD  at check-in to the Emergency Department and triage nurse. ° °Should you have questions after your visit or need to cancel or reschedule your appointment, please contact [facility]  Dept: [phone]  and follow the prompts.  Office hours are 8:00 a.m. to 4:30 p.m. Monday - Friday. Please note that voicemails left after 4:00 p.m. may not be returned until the following business day.  We are closed weekends and major holidays. You have access to a nurse at all times for urgent questions. Please call the main number to the clinic Dept: [phone] and follow the prompts. ° ° °For any non-urgent questions, you may also contact your provider using MyChart. We now offer e-Visits for anyone 18 and older to request care online for non-urgent symptoms. For details visit [email or web address]. °  °Also download the MyChart app! Go to the app store, search "MyChart", open the app, select [facility], and log in with your MyChart username and password. ° °Due to Covid, a mask is required upon entering the hospital/clinic. If you do not have a mask, one will be given to you upon arrival. For doctor visits, patients may have 1 support person aged 18 or older with them. For treatment visits, patients cannot have anyone with them due to current Covid guidelines and our immunocompromised population.  ° °

## 2021-09-07 DIAGNOSIS — I2510 Atherosclerotic heart disease of native coronary artery without angina pectoris: Secondary | ICD-10-CM | POA: Diagnosis not present

## 2021-09-07 DIAGNOSIS — M159 Polyosteoarthritis, unspecified: Secondary | ICD-10-CM | POA: Diagnosis not present

## 2021-09-07 DIAGNOSIS — I10 Essential (primary) hypertension: Secondary | ICD-10-CM | POA: Diagnosis not present

## 2021-09-07 DIAGNOSIS — E782 Mixed hyperlipidemia: Secondary | ICD-10-CM | POA: Diagnosis not present

## 2021-09-07 DIAGNOSIS — E1159 Type 2 diabetes mellitus with other circulatory complications: Secondary | ICD-10-CM | POA: Diagnosis not present

## 2021-09-12 ENCOUNTER — Ambulatory Visit: Payer: Medicare HMO | Admitting: Hematology and Oncology

## 2021-09-12 ENCOUNTER — Ambulatory Visit: Payer: Medicare HMO

## 2021-09-12 ENCOUNTER — Other Ambulatory Visit: Payer: Medicare HMO

## 2021-09-19 ENCOUNTER — Other Ambulatory Visit: Payer: Self-pay | Admitting: Hematology and Oncology

## 2021-09-20 ENCOUNTER — Encounter: Payer: Self-pay | Admitting: Hematology and Oncology

## 2021-09-26 ENCOUNTER — Encounter: Payer: Self-pay | Admitting: Hematology and Oncology

## 2021-09-26 ENCOUNTER — Inpatient Hospital Stay: Payer: Medicare HMO | Attending: Gynecologic Oncology | Admitting: Hematology and Oncology

## 2021-09-26 ENCOUNTER — Inpatient Hospital Stay: Payer: Medicare HMO

## 2021-09-26 ENCOUNTER — Other Ambulatory Visit: Payer: Self-pay

## 2021-09-26 VITALS — BP 150/80 | HR 66 | Temp 99.5°F | Resp 18 | Ht 59.0 in | Wt 193.4 lb

## 2021-09-26 DIAGNOSIS — Z5111 Encounter for antineoplastic chemotherapy: Secondary | ICD-10-CM | POA: Insufficient documentation

## 2021-09-26 DIAGNOSIS — C5701 Malignant neoplasm of right fallopian tube: Secondary | ICD-10-CM

## 2021-09-26 DIAGNOSIS — M797 Fibromyalgia: Secondary | ICD-10-CM | POA: Diagnosis not present

## 2021-09-26 DIAGNOSIS — D696 Thrombocytopenia, unspecified: Secondary | ICD-10-CM | POA: Diagnosis not present

## 2021-09-26 DIAGNOSIS — R2689 Other abnormalities of gait and mobility: Secondary | ICD-10-CM | POA: Diagnosis not present

## 2021-09-26 DIAGNOSIS — C5700 Malignant neoplasm of unspecified fallopian tube: Secondary | ICD-10-CM | POA: Diagnosis not present

## 2021-09-26 DIAGNOSIS — Z7189 Other specified counseling: Secondary | ICD-10-CM

## 2021-09-26 DIAGNOSIS — R058 Other specified cough: Secondary | ICD-10-CM

## 2021-09-26 LAB — CBC WITH DIFFERENTIAL/PLATELET
Abs Immature Granulocytes: 0.01 10*3/uL (ref 0.00–0.07)
Basophils Absolute: 0 10*3/uL (ref 0.0–0.1)
Basophils Relative: 1 %
Eosinophils Absolute: 0.1 10*3/uL (ref 0.0–0.5)
Eosinophils Relative: 2 %
HCT: 41.1 % (ref 36.0–46.0)
Hemoglobin: 13.9 g/dL (ref 12.0–15.0)
Immature Granulocytes: 0 %
Lymphocytes Relative: 32 %
Lymphs Abs: 1.7 10*3/uL (ref 0.7–4.0)
MCH: 30.2 pg (ref 26.0–34.0)
MCHC: 33.8 g/dL (ref 30.0–36.0)
MCV: 89.3 fL (ref 80.0–100.0)
Monocytes Absolute: 0.5 10*3/uL (ref 0.1–1.0)
Monocytes Relative: 9 %
Neutro Abs: 3 10*3/uL (ref 1.7–7.7)
Neutrophils Relative %: 56 %
Platelets: 142 10*3/uL — ABNORMAL LOW (ref 150–400)
RBC: 4.6 MIL/uL (ref 3.87–5.11)
RDW: 13.7 % (ref 11.5–15.5)
WBC: 5.3 10*3/uL (ref 4.0–10.5)
nRBC: 0 % (ref 0.0–0.2)

## 2021-09-26 LAB — COMPREHENSIVE METABOLIC PANEL
ALT: 23 U/L (ref 0–44)
AST: 23 U/L (ref 15–41)
Albumin: 4.3 g/dL (ref 3.5–5.0)
Alkaline Phosphatase: 67 U/L (ref 38–126)
Anion gap: 7 (ref 5–15)
BUN: 24 mg/dL — ABNORMAL HIGH (ref 8–23)
CO2: 29 mmol/L (ref 22–32)
Calcium: 9.3 mg/dL (ref 8.9–10.3)
Chloride: 103 mmol/L (ref 98–111)
Creatinine, Ser: 0.78 mg/dL (ref 0.44–1.00)
GFR, Estimated: >60 mL/min (ref >60)
Glucose, Bld: 107 mg/dL — ABNORMAL HIGH (ref 70–99)
Potassium: 4.2 mmol/L (ref 3.5–5.1)
Sodium: 139 mmol/L (ref 135–145)
Total Bilirubin: 0.6 mg/dL (ref 0.3–1.2)
Total Protein: 6.9 g/dL (ref 6.5–8.1)

## 2021-09-26 LAB — TOTAL PROTEIN, URINE DIPSTICK: Protein, ur: NEGATIVE mg/dL

## 2021-09-26 MED ORDER — SODIUM CHLORIDE 0.9% FLUSH
10.0000 mL | Freq: Once | INTRAVENOUS | Status: AC
  Administered 2021-09-26: 10 mL

## 2021-09-26 MED ORDER — SODIUM CHLORIDE 0.9 % IV SOLN: Freq: Once | INTRAVENOUS | Status: AC

## 2021-09-26 MED ORDER — SODIUM CHLORIDE 0.9 % IV SOLN
15.0000 mg/kg | Freq: Once | INTRAVENOUS | Status: AC
  Administered 2021-09-26: 1300 mg via INTRAVENOUS
  Filled 2021-09-26: qty 48

## 2021-09-26 MED ORDER — HEPARIN SOD (PORK) LOCK FLUSH 100 UNIT/ML IV SOLN
500.0000 [IU] | Freq: Once | INTRAVENOUS | Status: AC | PRN
  Administered 2021-09-26: 500 [IU]

## 2021-09-26 MED ORDER — SODIUM CHLORIDE 0.9% FLUSH
10.0000 mL | INTRAVENOUS | Status: DC | PRN
  Administered 2021-09-26: 10 mL

## 2021-09-26 NOTE — Assessment & Plan Note (Signed)
She has recent diffuse myalgias, likely exacerbation of her fibromyalgia Her labs and electrolytes are stable

## 2021-09-26 NOTE — Assessment & Plan Note (Signed)
She has chronic balance issues She denies recent falls

## 2021-09-26 NOTE — Patient Instructions (Signed)
[facility]  ° Discharge Instructions: °Thank you for choosing [facility] to provide your oncology and hematology care.  ° °If you have a lab appointment with the Cancer Center, please go directly to the Cancer Center and check in at the registration area. °  °Wear comfortable clothing and clothing appropriate for easy access to any Portacath or PICC line.  ° °We strive to give you quality time with your provider. You may need to reschedule your appointment if you arrive late (15 or more minutes).  Arriving late affects you and other patients whose appointments are after yours.  Also, if you miss three or more appointments without notifying the office, you may be dismissed from the clinic at the provider’s discretion.    °  °For prescription refill requests, have your pharmacy contact our office and allow 72 hours for refills to be completed.   ° °Today you received the following chemotherapy and/or immunotherapy agents: Bevacizumab (Avastin)    °  °To help prevent nausea and vomiting after your treatment, we encourage you to take your nausea medication as directed. ° °BELOW ARE SYMPTOMS THAT SHOULD BE REPORTED IMMEDIATELY: °*FEVER GREATER THAN 100.4 F (38 °C) OR HIGHER °*CHILLS OR SWEATING °*NAUSEA AND VOMITING THAT IS NOT CONTROLLED WITH YOUR NAUSEA MEDICATION °*UNUSUAL SHORTNESS OF BREATH °*UNUSUAL BRUISING OR BLEEDING °*URINARY PROBLEMS (pain or burning when urinating, or frequent urination) °*BOWEL PROBLEMS (unusual diarrhea, constipation, pain near the anus) °TENDERNESS IN MOUTH AND THROAT WITH OR WITHOUT PRESENCE OF ULCERS (sore throat, sores in mouth, or a toothache) °UNUSUAL RASH, SWELLING OR PAIN  °UNUSUAL VAGINAL DISCHARGE OR ITCHING  ° °Items with * indicate a potential emergency and should be followed up as soon as possible or go to the Emergency Department if any problems should occur. ° °Please show the CHEMOTHERAPY ALERT CARD or IMMUNOTHERAPY ALERT CARD  at check-in to the Emergency Department and triage nurse. ° °Should you have questions after your visit or need to cancel or reschedule your appointment, please contact [facility]  Dept: [phone]  and follow the prompts.  Office hours are 8:00 a.m. to 4:30 p.m. Monday - Friday. Please note that voicemails left after 4:00 p.m. may not be returned until the following business day.  We are closed weekends and major holidays. You have access to a nurse at all times for urgent questions. Please call the main number to the clinic Dept: [phone] and follow the prompts. ° ° °For any non-urgent questions, you may also contact your provider using MyChart. We now offer e-Visits for anyone 18 and older to request care online for non-urgent symptoms. For details visit [email or web address]. °  °Also download the MyChart app! Go to the app store, search "MyChart", open the app, select [facility], and log in with your MyChart username and password. ° °Due to Covid, a mask is required upon entering the hospital/clinic. If you do not have a mask, one will be given to you upon arrival. For doctor visits, patients may have 1 support person aged 18 or older with them. For treatment visits, patients cannot have anyone with them due to current Covid guidelines and our immunocompromised population.  ° °

## 2021-09-26 NOTE — Assessment & Plan Note (Signed)
Her thrombocytopenia is stable Observe only for now 

## 2021-09-26 NOTE — Assessment & Plan Note (Signed)
She has chronic dry cough Her examination is benign She does not have leukocytosis and her temperature is normal Observe only

## 2021-09-26 NOTE — Assessment & Plan Note (Signed)
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

## 2021-09-26 NOTE — Progress Notes (Signed)
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 showed no signs of disease relapse or progression She tolerated bevacizumab well without side effects The plan will be continue indefinitely I plan to repeat imaging study again in March 2023  Thrombocytopenia [facility]) Her thrombocytopenia is stable Observe only for now  Fibromyalgia She has recent diffuse myalgias, likely exacerbation of her fibromyalgia Her labs and electrolytes are stable  Balance problems She has chronic balance issues She denies recent falls  Dry cough She has chronic dry cough Her examination is benign She does not have leukocytosis and her temperature is normal Observe only  Orders Placed This Encounter  Procedures   CT ABDOMEN PELVIS W CONTRAST    Standing Status:   Future    Standing Expiration Date:   09/26/2022    Order Specific Question:   If indicated for the ordered procedure, I authorize the administration of contrast media per Radiology protocol    Answer:   Yes    Order Specific Question:   Preferred imaging location?    Answer:   [facility]    Order Specific Question:   Radiology Contrast Protocol - do NOT remove file path    Answer:   [email or web address]    All questions were answered. The patient knows to call the clinic with any problems, questions or concerns. The total time spent in the appointment was 30 minutes encounter with patients including review of chart and various tests results, discussions about plan of care and coordination of care plan   [name], MD 09/26/2021 9:16 AM  INTERVAL HISTORY: Please see below for problem oriented charting. she returns for treatment follow-up on bevacizumab for maintenance treatment for  recurrent fallopian tube cancer She continues to battle with some chronic issues She has chronic balance difficulties No recent falls She has diffuse muscular skeletal pain which she thought could be related to her fibromyalgia She has occasional sore throat sensation and low-grade temperature She has chronic cough No fever or chills Her chronic diarrhea is slightly improved with changes in the colestipol medication dose  REVIEW OF SYSTEMS:   Constitutional: Denies fevers, chills or abnormal weight loss Eyes: Denies blurriness of vision Cardiovascular: Denies palpitation, chest discomfort or lower extremity swelling Skin: Denies abnormal skin rashes Lymphatics: Denies new lymphadenopathy or easy bruising Neurological:Denies numbness, tingling or new weaknesses Behavioral/Psych: Mood is stable, no new changes  All other systems were reviewed with the patient and are negative.  I have reviewed the past medical history, past surgical history, social history and family history with the patient and they are unchanged from previous note.  ALLERGIES:  is allergic to [employer] tartrate], bee venom, and cortisone.  MEDICATIONS:  Current Outpatient Medications  Medication Sig Dispense Refill   colestipol (COLESTID) 1 g tablet Take 2 g by mouth at bedtime.     amLODipine (NORVASC) 10 MG tablet Take 1 tablet (10 mg total) by mouth daily. 30 tablet 11   Armodafinil 250 MG tablet TAKE ONE TABLET BY MOUTH EVERY MORNING 90 tablet 1   bevacizumab in sodium chloride 0.9 % 100 mL Inject into the vein once.     busPIRone (BUSPAR) 7.5 MG tablet Take 15 mg by mouth 2 (two) times daily.     chlorthalidone (HYGROTON) 25 MG tablet Take 25 mg  by mouth daily.     cholecalciferol (VITAMIN D3) 25 MCG (1000 UT) tablet Take 1,000 Units by mouth daily.     eszopiclone (LUNESTA) 2 MG TABS tablet TAKE ONE TABLET BY MOUTH EVERYDAY AT BEDTIME 90 tablet 1   FIBER PO Take by mouth.     FLUoxetine (PROZAC) 20 MG  capsule Take 40 mg by mouth every morning. Takes with a 84m capsule for her total dose of 355m     lidocaine-prilocaine (EMLA) cream Apply 1 application topically as needed. 30 g 1   MELATONIN PO Take 10 mg by mouth at bedtime.      metFORMIN (GLUCOPHAGE-XR) 500 MG 24 hr tablet Take 500 mg by mouth daily with breakfast.   3   metoprolol tartrate (LOPRESSOR) 25 MG tablet Take 1 tablet (25 mg total) by mouth 2 (two) times daily. 60 tablet 3   modafinil (PROVIGIL) 200 MG tablet TAKE ONE TABLET BY MOUTH EVERY MORNING 30 tablet 5   Multiple Vitamin (MULTIVITAMIN WITH MINERALS) TABS Take 1 tablet by mouth every morning.      rosuvastatin (CRESTOR) 40 MG tablet      telmisartan (MICARDIS) 40 MG tablet Take 40 mg by mouth daily.     No current facility-administered medications for this visit.   Facility-Administered Medications Ordered in Other Visits  Medication Dose Route Frequency Provider Last Rate Last Admin   bevacizumab-bvzr (ZIRABEV) 1,300 mg in sodium chloride 0.9 % 100 mL chemo infusion  15 mg/kg (Treatment Plan Recorded) Intravenous Once GoHeath LarkMD 304 mL/hr at 09/26/21 0914 1,300 mg at 09/26/21 0914   heparin lock flush 100 unit/mL  500 Units Intracatheter Once PRN GoAlvy BimlerNi, MD       sodium chloride flush (NS) 0.9 % injection 10 mL  10 mL Intravenous PRN GeNancy MarusMD   10 mL at 05/12/17 [id number] sodium chloride flush (NS) 0.9 % injection 10 mL  10 mL Intracatheter PRN GoHeath LarkMD        SUMMARY OF ONCOLOGIC HISTORY: Oncology History Overview Note  Oncologic Summary: 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  Interval development of large mid abdominal mass highly concerning for right ovarian cancer.  There is mild omental nodularity on the left, and peritoneal disease cannot be completely excluded.  There is no ascites or other evidence of metastatic disease. 2.  Mild associated renal pelvocaliectasis bilaterally without obstruction.  Nonobstructing left renal calculus and a small right renal angiomyolipoma noted incidentally.   11/08/2012 Pathology Results   1. 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 - BENIGN FALLOPIAN TUBE WITH NONSPECIFIC CHRONIC INFLAMMATION. 2. Ovary and fallopian tube, left - BENIGN OVARY; NEGATIVE FOR ATYPIA OR MALIGNANCY. - BENIGN FALLOPIAN TUBE; NEGATIVE FOR ATYPIA OR MALIGNANCY. 3. Omentum, resection for tumor - HIGH GRADE CARCINOMA, SEE COMMENT. 4. Appendix, Other than Incidental - FIBROUS OBLITERATION OF APPENDICEAL TIP. - NEGATIVE FOR MALIGNANCY.   11/08/2012 Surgery  Surgery: Exploratory laparotomy, bilateral salpingo-oophorectomy, appendectomy, infacolic omentectomy, optimal debulking   Surgeons:  Paola A. Alycia Rossetti, MD; Lahoma Crocker, MD    Assistant: Caswell Corwin  Pathology: Bilateral fallopian tubes and ovaries to pathology. Appendix as well as omentum. 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   Operative findings: 25 cm right adnexal mass with smooth surface. Surgically absent uterus. Atrophic-appearing left ovary. Normal appearing appendix. Within the omentum there were centimeter nodules scattered throughout the omentum. The remainder of the surfaces were  benign.   12/08/2012 Procedure   Impression:  Placement of a subcutaneous port device.  The catheter tip is in the lower SVC and ready to be used.     12/13/2012 - 03/28/2013 Chemotherapy   s/p 6 cycles of paclitaxel and carboplatin   12/13/2012 - 03/28/2013 Chemotherapy   The patient had 6 cycles of carboplatin and Taxol   03/24/2013 Imaging   US abdomen   04/24/2013 Imaging   CT abdomen Interval resection of the large right pelvic and lower abdominal mass lesion with apparent omentectomy.  No evidence for intraperitoneal free fluid on today's study.  No discernible peritoneal lesions.   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   09/07/2013 Genetic Testing   Patient has genetic testing done for BRCA1/2 panel Results revealed patient has no mutation(s):   07/09/2015 Imaging   CT abdomen 1. 12 mm obstructive calculus at the left ureteropelvic junction with moderate proximal hydronephrosis. 2. 2 small supraumbilical ventral hernias, one containing a short segment of the mid transverse colon and the other containing a short segment of the mid small bowel. There is no associated evidence to suggest bowel incarceration or obstruction at this time. 3. Tiny locule of gas non dependently in the lumen of the urinary bladder. This is presumably iatrogenic related to recent catheterization for urinalysis. Alternatively, this could be seen in the setting of urinary tract infection with gas-forming organisms. Clinical correlation for history of recent catheterization is recommended. 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 5. Status post cholecystectomy. 6. Additional incidental findings, as above.     12/11/2016 Imaging   Ct abdomen 1. No evidence of metastatic ovarian cancer. 2. Recurrent subxiphoid ventral abdominal wall hernia containing transverse colon. No evidence of incarceration or obstruction. 3. Stable incidental findings in the liver and kidneys. No recurrent  urinary tract calculus. 4. Progressive lower lumbar spondylosis. 5.  Aortic Atherosclerosis (ICD10-I70.0).     08/30/2017 Imaging   MRI thoracic spine 1. At T5-6 there is a small central disc protrusion contacting the ventral thoracic spinal cord. No central canal or foraminal stenosis. 2. At T9-10 there is a small right paracentral disc protrusion. 3.  No acute osseous injury of the thoracic spine. 4. No aggressive osseous lesion to suggest metastatic disease.   09/24/2017 Tumor Marker   Patient's tumor was tested for the following markers: CA-125 Results of the tumor marker test revealed 21.7   09/30/2017 Imaging   CT abdomen 1. New small clustered soft tissue nodules in the left lower quadrant in the sigmoid mesentery, largest 1.0 cm, which could represent recurrent peritoneal tumor implants. No ascites.  2. Midline high ventral abdominal wall hernia containing a portion of the transverse colon is mildly increased in size, and without bowel complication at this time. 3. Chronic findings include: Aortic Atherosclerosis (ICD10-I70.0). Diffuse hepatic steatosis. 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   10/11/2017 PET scan   1. Nodules in the sigmoid mesentery are hypermetabolic and highly worrisome for metastatic disease. 2. Attic steatosis.     11/03/2017 Procedure   Successful CT-guided rectus abdominal muscle mass core biopsy. Path: - FOREIGN BODY GIANT CELL REACTION INVOLVING FIBROADIPOSE TISSUE AND SKELETAL MUSCLE. - NO EVIDENCE OF MALIGNANCY.   11/04/2017 Cancer Staging   Staging form: Fallopian Tube, AJCC 7th Edition - Clinical: FIGO Stage IIIC, calculated as Stage IV (rT3, N0, M1) - Signed by [name], MD on 08/09/2019    02/2018 Imaging   CT: 1. Continued increase in size small peritoneal nodules along the mesenteric border of the proximal sigmoid colon as well as along the serosal surface of the proximal sigmoid  colon. 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 2. No evidence of distant disease. 3. Stable large ventral hernia.   05/2018 Imaging   PET: 1. Redemonstration of hypermetabolic nodules within the sigmoid mesocolon. Mild response to therapy relative to CT of 02/24/2018. Mixed response to therapy compared to the most recent PET of 10/11/2017. 2. Hypermetabolism within the right pelvic rectus musculature, increased since the prior PET.  Clinical service requested comparison to the 11/24/2017 CT. Index 10 mm nodule within the sigmoid mesocolon was similar to the 02/24/2018 CT, and as described on that exam, increased from 7 mm on 11/24/2017. More inferior nodule within the mesocolon measures 12 mm today on image 156/4 and 8 mm on 11/24/2017.   05/2018 Imaging   CT: IMPRESSION: 1. Since 02/24/2018, decreased size of peritoneal nodules centered in the sigmoid mesocolon. 2. No evidence of new or progressive disease. 3. Hepatic steatosis. 4. Subcentimeter right renal angiomyolipoma, similar. 5.  Aortic Atherosclerosis (ICD10-I70.0). 6. Ventral abdominal wall laxity containing transverse colon, similar.   08/2018 Imaging   CT: IMPRESSION: 1. Nodules within the sigmoid mesocolon have decreased in size compared to prior. No new peritoneal or omental nodularity. 2. No evidence local recurrence the pelvis. 3. Ventral hernia contains a segment of transverse colon. No change from prior.     06/19/2019 Imaging   1. No evidence of metastatic disease in the abdomen pelvis. 2. No peritoneal or omental metastasis identified.  No free fluid. 3. Postcholecystectomy and hysterectomy. Comparison exams are made available. Comparison CT 09/08/2018 and 05/27/2018. PET-CT 05/27/2018    There is a nodule within the proximal aspect of the sigmoid colon measuring 2.2 by 2.2 cm. In comparison to prior CTs and PET-CT there was a hypermetabolic nodule at this location on the PET-CT  of 08/27/2017 and on the CT of 09/08/2018 there was a small residual nodule. At that time (09/08/2018) the nodule measured 1.4 by 1.3 cm. Therefore this nodule has increased in the interval and concerning for recurrence of a serosal implant. The previous described lymph nodes in the sigmoid mesocolon and more central mesentery are not increased in size and not pathologic by size criteria.    Concern for recurrence of serosal metastasis in the proximal sigmoid colon with a 2 cm enlarging lesion. Lesion extends into the lumen. No evidence of high-grade obstruction. Consider FDG PET scan and/or potential colonoscopy for evaluation.   06/29/2019 PET scan   1. Enlarging serosal implant within the proximal sigmoid colon has intense metabolic activity most consistent with malignancy. Lesion exhibits a somewhat indolent progression as present on PET-CT scan from 10/11/2017 and 05/27/2018. 2. Focal hypermetabolic activity within the RIGHT rectus muscle just off midline is slightly decreased from comparison exam. This  may be benign inflammation related to prior laparotomy, however malignancy not excluded.   07/26/2019 Procedure   She had colonoscopy which showed multiple polyps.  6 polyps were removed from the cecum, measuring 3 to 6 mm in size.  One 12 mm polyp was removed from ascending colon and another 1 measures 7 mm.  One 5 mm polyp was removed from the transverse colon.  There is tumor noted in the sigmoid colon, approximately 35 cm from the anus which was biopsied.  The tumor appeared to be fungating, infiltrative and ulcerated but nonobstructive.  It encompassed approximately one third of the circumference of the lumen.   07/26/2019 Pathology Results   Multiple polyps came back tubular adenomas.  2 ascending polyps came back sessile serrated adenoma months.  Sigmoid colon biopsy confirmed metastatic high-grade serous carcinoma.  The morphology and immunophenotype are consistent with metastatic  high-grade serous carcinoma from tubal/ovarian primary site.   08/17/2019 - 12/12/2019 Chemotherapy   The patient had carboplatin and taxol   10/23/2019 Imaging   1. Sigmoid lesion with diminished size/conspicuity difficult to measure on the previous exam. Adjacent lymph nodes and nodularity less than a cm, largest on coronal image 48 measuring 5 mm. 2. Right rectus muscle with some thickening with mildly convex margin seen posteriorly on image 72 of series 2. This could be due to postsurgical change, however, metastatic disease is not excluded. Attention on follow-up is suggested. 3. No evidence for new metastatic disease. 4. Small hiatal hernia. 5. 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   Aortic Atherosclerosis (ICD10-I70.0).   01/01/2020 Imaging   1. No signs of new disease. 2. Tiny lymph nodes in the area of concern in the LEFT lower quadrant z. 3. Added density and subtle contour irregularity involving the rectus muscles best seen on sagittal images, associated with site of prior surgical incision just to the RIGHT of midline (image 72, series 2 and image 58, series 5. Not significantly changed compared to prior studies. This measures approximately 2.1 x 1 cm in the sagittal plane and is less well-defined in the axial plane. Potentially postoperative change. Attention on follow-up. 4. Aortic atherosclerosis.   Aortic Atherosclerosis (ICD10-I70.0).       01/29/2020 -  Chemotherapy   Patient is on Treatment Plan : Ovarian Bevacizumab     05/06/2020 Imaging   1. Status post hysterectomy and oophorectomy. 2. No specific findings of recurrent or metastatic disease in the abdomen or pelvis. 3. No change in postoperative appearance of low midline incision. 4. Unchanged small prominent subcentimeter left iliac lymph nodes.   10/22/2020 Imaging   1. Status post hysterectomy and oophorectomy. No evidence of recurrent or metastatic disease within the abdomen or pelvis. 2. No significant  change in the subcentimeter prominent left iliac lymph nodes.   04/11/2021 Imaging   1. No findings to suggest recurrent or metastatic disease in the abdomen or pelvis. 2. Aortic atherosclerosis, in addition to at least left anterior descending coronary artery disease. Assessment for potential risk factor modification, dietary therapy or pharmacologic therapy may be warranted, if clinically indicated. 3. Additional incidental findings, as above.     PHYSICAL EXAMINATION: ECOG PERFORMANCE STATUS: 1 - Symptomatic but completely ambulatory  Vitals:   09/26/21 0807  BP: (!) 150/80  Pulse: 66  Resp: 18  Temp: 99.5 F (37.5 C)  SpO2: 99%   Filed Weights   09/26/21 0807  Weight: 193 lb 6.4 oz (87.7 kg)    GENERAL:alert, no distress and comfortable SKIN:  skin color, texture, turgor are normal, no rashes or significant lesions EYES: normal, Conjunctiva are pink and non-injected, sclera clear OROPHARYNX:no exudate, no erythema and lips, buccal mucosa, and tongue normal  NECK: supple, thyroid normal size, non-tender, without nodularity LYMPH:  no palpable lymphadenopathy in the cervical, axillary or inguinal LUNGS: clear to auscultation and percussion with normal breathing effort HEART: regular rate & rhythm and no murmurs and no lower extremity edema ABDOMEN:abdomen soft, non-tender and normal bowel sounds Musculoskeletal:no cyanosis of digits and no clubbing  NEURO: alert & oriented x 3 with fluent speech, no focal motor/sensory deficits  LABORATORY DATA:  I have reviewed the data as listed    Component Value Date/Time   NA 139 09/26/2021 0739   NA 141 12/02/2016 1219   K 4.2 09/26/2021 0739   K 3.7 12/02/2016 1219   CL 103 09/26/2021 0739   CL 103 01/18/2013 1329   CO2 29 09/26/2021 0739   CO2 27 12/02/2016 1219   GLUCOSE 107 (H) 09/26/2021 0739   GLUCOSE 98 12/02/2016 1219   GLUCOSE 110 (H) 01/18/2013 1329   BUN 24 (H) 09/26/2021 0739   BUN 15.3 12/02/2016 1219    CREATININE 0.78 09/26/2021 0739   CREATININE 0.71 05/23/2021 0758   CREATININE 0.7 12/02/2016 1219   CALCIUM 9.3 09/26/2021 0739   CALCIUM 10.1 12/02/2016 1219   PROT 6.9 09/26/2021 0739   PROT 7.1 12/02/2016 1219   ALBUMIN 4.3 09/26/2021 0739   ALBUMIN 4.2 12/02/2016 1219   AST 23 09/26/2021 0739   AST 25 05/23/2021 0758   AST 21 12/02/2016 1219   ALT 23 09/26/2021 0739   ALT 26 05/23/2021 0758   ALT 27 12/02/2016 1219   ALKPHOS 67 09/26/2021 0739   ALKPHOS 104 12/02/2016 1219   BILITOT 0.6 09/26/2021 0739   BILITOT 0.6 05/23/2021 0758   BILITOT 0.37 12/02/2016 1219   GFRNONAA >60 09/26/2021 0739   GFRNONAA >60 05/23/2021 0758   GFRAA >60 05/07/2020 0833    No results found for: SPEP, UPEP  Lab Results  Component Value Date   WBC 5.3 09/26/2021   NEUTROABS 3.0 09/26/2021   HGB 13.9 09/26/2021   HCT 41.1 09/26/2021   MCV 89.3 09/26/2021   PLT 142 (L) 09/26/2021      Chemistry      Component Value Date/Time   NA 139 09/26/2021 0739   NA 141 12/02/2016 1219   K 4.2 09/26/2021 0739   K 3.7 12/02/2016 1219   CL 103 09/26/2021 0739   CL 103 01/18/2013 1329   CO2 29 09/26/2021 0739   CO2 27 12/02/2016 1219   BUN 24 (H) 09/26/2021 0739   BUN 15.3 12/02/2016 1219   CREATININE 0.78 09/26/2021 0739   CREATININE 0.71 05/23/2021 0758   CREATININE 0.7 12/02/2016 1219   GLU 236 12/13/2012 1558      Component Value Date/Time   CALCIUM 9.3 09/26/2021 0739   CALCIUM 10.1 12/02/2016 1219   ALKPHOS 67 09/26/2021 0739   ALKPHOS 104 12/02/2016 1219   AST 23 09/26/2021 0739   AST 25 05/23/2021 0758   AST 21 12/02/2016 1219   ALT 23 09/26/2021 0739   ALT 26 05/23/2021 0758   ALT 27 12/02/2016 1219   BILITOT 0.6 09/26/2021 0739   BILITOT 0.6 05/23/2021 0758   BILITOT 0.37 12/02/2016 1219

## 2021-10-15 ENCOUNTER — Inpatient Hospital Stay: Payer: Medicare HMO | Attending: Gynecologic Oncology

## 2021-10-15 ENCOUNTER — Ambulatory Visit (HOSPITAL_COMMUNITY)
Admission: RE | Admit: 2021-10-15 | Discharge: 2021-10-15 | Disposition: A | Discharge status: Home / Self Care | Payer: Medicare HMO | Source: Ambulatory Visit | Attending: Hematology and Oncology | Admitting: Hematology and Oncology

## 2021-10-15 ENCOUNTER — Other Ambulatory Visit: Payer: Self-pay

## 2021-10-15 DIAGNOSIS — C786 Secondary malignant neoplasm of retroperitoneum and peritoneum: Secondary | ICD-10-CM | POA: Insufficient documentation

## 2021-10-15 DIAGNOSIS — I700 Atherosclerosis of aorta: Secondary | ICD-10-CM | POA: Diagnosis not present

## 2021-10-15 DIAGNOSIS — D1771 Benign lipomatous neoplasm of kidney: Secondary | ICD-10-CM | POA: Insufficient documentation

## 2021-10-15 DIAGNOSIS — Z5111 Encounter for antineoplastic chemotherapy: Secondary | ICD-10-CM | POA: Insufficient documentation

## 2021-10-15 DIAGNOSIS — C187 Malignant neoplasm of sigmoid colon: Secondary | ICD-10-CM | POA: Insufficient documentation

## 2021-10-15 DIAGNOSIS — C5701 Malignant neoplasm of right fallopian tube: Secondary | ICD-10-CM

## 2021-10-15 DIAGNOSIS — C5700 Malignant neoplasm of unspecified fallopian tube: Secondary | ICD-10-CM

## 2021-10-15 LAB — CBC WITH DIFFERENTIAL/PLATELET
Abs Immature Granulocytes: 0.01 10*3/uL (ref 0.00–0.07)
Basophils Absolute: 0 10*3/uL (ref 0.0–0.1)
Basophils Relative: 0 %
Eosinophils Absolute: 0.1 10*3/uL (ref 0.0–0.5)
Eosinophils Relative: 2 %
HCT: 41.2 % (ref 36.0–46.0)
Hemoglobin: 14 g/dL (ref 12.0–15.0)
Immature Granulocytes: 0 %
Lymphocytes Relative: 32 %
Lymphs Abs: 1.7 10*3/uL (ref 0.7–4.0)
MCH: 30.6 pg (ref 26.0–34.0)
MCHC: 34 g/dL (ref 30.0–36.0)
MCV: 90 fL (ref 80.0–100.0)
Monocytes Absolute: 0.6 10*3/uL (ref 0.1–1.0)
Monocytes Relative: 10 %
Neutro Abs: 3 10*3/uL (ref 1.7–7.7)
Neutrophils Relative %: 56 %
Platelets: 136 10*3/uL — ABNORMAL LOW (ref 150–400)
RBC: 4.58 MIL/uL (ref 3.87–5.11)
RDW: 13.7 % (ref 11.5–15.5)
WBC: 5.4 10*3/uL (ref 4.0–10.5)
nRBC: 0 % (ref 0.0–0.2)

## 2021-10-15 LAB — COMPREHENSIVE METABOLIC PANEL
ALT: 23 U/L (ref 0–44)
AST: 22 U/L (ref 15–41)
Albumin: 4.3 g/dL (ref 3.5–5.0)
Alkaline Phosphatase: 75 U/L (ref 38–126)
Anion gap: 7 (ref 5–15)
BUN: 16 mg/dL (ref 8–23)
CO2: 29 mmol/L (ref 22–32)
Calcium: 9.7 mg/dL (ref 8.9–10.3)
Chloride: 103 mmol/L (ref 98–111)
Creatinine, Ser: 0.66 mg/dL (ref 0.44–1.00)
GFR, Estimated: >60 mL/min (ref >60)
Glucose, Bld: 107 mg/dL — ABNORMAL HIGH (ref 70–99)
Potassium: 4.1 mmol/L (ref 3.5–5.1)
Sodium: 139 mmol/L (ref 135–145)
Total Bilirubin: 0.5 mg/dL (ref 0.3–1.2)
Total Protein: 6.8 g/dL (ref 6.5–8.1)

## 2021-10-15 LAB — TOTAL PROTEIN, URINE DIPSTICK: Protein, ur: 30 mg/dL — AB

## 2021-10-15 MED ORDER — HEPARIN SOD (PORK) LOCK FLUSH 100 UNIT/ML IV SOLN
INTRAVENOUS | Status: AC
  Filled 2021-10-15: qty 5

## 2021-10-15 MED ORDER — IOHEXOL 300 MG/ML  SOLN
100.0000 mL | Freq: Once | INTRAMUSCULAR | Status: AC | PRN
  Administered 2021-10-15: 100 mL via INTRAVENOUS

## 2021-10-15 MED ORDER — IOHEXOL 9 MG/ML PO SOLN
500.0000 mL | ORAL | Status: AC
  Administered 2021-10-15: 1000 mL via ORAL

## 2021-10-15 MED ORDER — SODIUM CHLORIDE (PF) 0.9 % IJ SOLN
INTRAMUSCULAR | Status: AC
  Filled 2021-10-15: qty 50

## 2021-10-15 MED ORDER — IOHEXOL 9 MG/ML PO SOLN
ORAL | Status: AC
  Filled 2021-10-15: qty 1000

## 2021-10-15 MED ORDER — HEPARIN SOD (PORK) LOCK FLUSH 100 UNIT/ML IV SOLN
500.0000 [IU] | Freq: Once | INTRAVENOUS | Status: AC
  Administered 2021-10-15: 500 [IU] via INTRAVENOUS

## 2021-10-15 MED ORDER — SODIUM CHLORIDE 0.9% FLUSH
10.0000 mL | Freq: Once | INTRAVENOUS | Status: AC
  Administered 2021-10-15: 10 mL

## 2021-10-17 ENCOUNTER — Encounter: Payer: Self-pay | Admitting: Hematology and Oncology

## 2021-10-17 ENCOUNTER — Other Ambulatory Visit: Payer: Self-pay

## 2021-10-17 ENCOUNTER — Inpatient Hospital Stay: Payer: Medicare HMO | Admitting: Hematology and Oncology

## 2021-10-17 ENCOUNTER — Inpatient Hospital Stay: Payer: Medicare HMO

## 2021-10-17 DIAGNOSIS — M797 Fibromyalgia: Secondary | ICD-10-CM | POA: Diagnosis not present

## 2021-10-17 DIAGNOSIS — C5701 Malignant neoplasm of right fallopian tube: Secondary | ICD-10-CM | POA: Diagnosis not present

## 2021-10-17 DIAGNOSIS — D696 Thrombocytopenia, unspecified: Secondary | ICD-10-CM

## 2021-10-17 DIAGNOSIS — Z7189 Other specified counseling: Secondary | ICD-10-CM

## 2021-10-17 DIAGNOSIS — C187 Malignant neoplasm of sigmoid colon: Secondary | ICD-10-CM | POA: Diagnosis not present

## 2021-10-17 DIAGNOSIS — C786 Secondary malignant neoplasm of retroperitoneum and peritoneum: Secondary | ICD-10-CM | POA: Diagnosis not present

## 2021-10-17 DIAGNOSIS — C5700 Malignant neoplasm of unspecified fallopian tube: Secondary | ICD-10-CM

## 2021-10-17 DIAGNOSIS — Z5111 Encounter for antineoplastic chemotherapy: Secondary | ICD-10-CM | POA: Diagnosis not present

## 2021-10-17 MED ORDER — SODIUM CHLORIDE 0.9 % IV SOLN
15.0000 mg/kg | Freq: Once | INTRAVENOUS | Status: AC
  Administered 2021-10-17: 1300 mg via INTRAVENOUS
  Filled 2021-10-17: qty 48

## 2021-10-17 MED ORDER — SODIUM CHLORIDE 0.9% FLUSH
10.0000 mL | INTRAVENOUS | Status: DC | PRN
  Administered 2021-10-17: 10 mL

## 2021-10-17 MED ORDER — HEPARIN SOD (PORK) LOCK FLUSH 100 UNIT/ML IV SOLN
500.0000 [IU] | Freq: Once | INTRAVENOUS | Status: AC | PRN
  Administered 2021-10-17: 500 [IU]

## 2021-10-17 MED ORDER — SODIUM CHLORIDE 0.9 % IV SOLN: Freq: Once | INTRAVENOUS | Status: AC

## 2021-10-17 NOTE — Assessment & Plan Note (Signed)
Her thrombocytopenia is stable Observe only for now 

## 2021-10-17 NOTE — Progress Notes (Signed)
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 imaging showed no signs of disease relapse or progression She tolerated bevacizumab well without side effects The plan will be continue indefinitely Due to stability of her cancer, I plan to lengthen the interval between CT imaging Her next CT imaging will be in December 2023  Thrombocytopenia [facility]) Her thrombocytopenia is stable Observe only for now  Fibromyalgia She has chronic fibromyalgia and intermittent tingling and pain in her upper extremities When I review her cervical MRI from 2010, she is known to have degenerative disc disease I recommend consideration for evaluation and management by neurosurgeon She will think about  No orders of the defined types were placed in this encounter.   All questions were answered. The patient knows to call the clinic with any problems, questions or concerns. The total time spent in the appointment was 30 minutes encounter with patients including review of chart and various tests results, discussions about plan of care and coordination of care plan   [name], MD 10/17/2021 1:08 PM  INTERVAL HISTORY: Please see below for problem oriented charting. 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 She had recent imaging study performed that was normal She has no new side effects from treatment She has chronic fibromyalgia and intermittent tingling sensation of her upper extremities No neurological deficits Her blood pressure is within normal range She denies recent fall  REVIEW OF SYSTEMS:   Constitutional: Denies fevers, chills or abnormal weight loss Eyes: Denies blurriness of vision Ears, nose, mouth, throat, and face: Denies mucositis or  sore throat Respiratory: Denies cough, dyspnea or wheezes Cardiovascular: Denies palpitation, chest discomfort or lower extremity swelling Gastrointestinal:  Denies nausea, heartburn or change in bowel habits Skin: Denies abnormal skin rashes Lymphatics: Denies new lymphadenopathy or easy bruising Neurological:Denies numbness, tingling or new weaknesses Behavioral/Psych: Mood is stable, no new changes  All other systems were reviewed with the patient and are negative.  I have reviewed the past medical history, past surgical history, social history and family history with the patient and they are unchanged from previous note.  ALLERGIES:  is allergic to [employer] tartrate], bee venom, and cortisone.  MEDICATIONS:  Current Outpatient Medications  Medication Sig Dispense Refill   amLODipine (NORVASC) 10 MG tablet Take 1 tablet (10 mg total) by mouth daily. 30 tablet 11   Armodafinil 250 MG tablet TAKE ONE TABLET BY MOUTH EVERY MORNING 90 tablet 1   bevacizumab in sodium chloride 0.9 % 100 mL Inject into the vein once.     busPIRone (BUSPAR) 7.5 MG tablet Take 15 mg by mouth 2 (two) times daily.     chlorthalidone (HYGROTON) 25 MG tablet Take 25 mg by mouth daily.     cholecalciferol (VITAMIN D3) 25 MCG (1000 UT) tablet Take 1,000 Units by mouth daily.     colestipol (COLESTID) 1 g tablet Take 2 g by mouth at bedtime.     eszopiclone (LUNESTA) 2 MG TABS tablet TAKE ONE TABLET BY MOUTH EVERYDAY AT BEDTIME 90 tablet 1   FIBER PO Take by mouth.     FLUoxetine (PROZAC) 20 MG capsule Take 40 mg by mouth every morning. Takes with a 21m capsule for her total dose of 379m     lidocaine-prilocaine (EMLA) cream Apply  1 application topically as needed. 30 g 1   MELATONIN PO Take 10 mg by mouth at bedtime.      metFORMIN (GLUCOPHAGE-XR) 500 MG 24 hr tablet Take 500 mg by mouth daily with breakfast.   3   metoprolol tartrate (LOPRESSOR) 25 MG tablet Take 1 tablet (25 mg total) by mouth 2 (two)  times daily. 60 tablet 3   modafinil (PROVIGIL) 200 MG tablet TAKE ONE TABLET BY MOUTH EVERY MORNING 30 tablet 5   Multiple Vitamin (MULTIVITAMIN WITH MINERALS) TABS Take 1 tablet by mouth every morning.      rosuvastatin (CRESTOR) 40 MG tablet      telmisartan (MICARDIS) 40 MG tablet Take 40 mg by mouth daily.     No current facility-administered medications for this visit.   Facility-Administered Medications Ordered in Other Visits  Medication Dose Route Frequency Provider Last Rate Last Admin   bevacizumab-bvzr (ZIRABEV) 1,300 mg in sodium chloride 0.9 % 100 mL chemo infusion  15 mg/kg (Treatment Plan Recorded) Intravenous Once Alvy Bimler, Sakura Denis, MD       heparin lock flush 100 unit/mL  500 Units Intracatheter Once PRN Alvy Bimler, Tania Steinhauser, MD       sodium chloride flush (NS) 0.9 % injection 10 mL  10 mL Intravenous PRN Nancy Marus, MD   10 mL at 05/12/17 1610   sodium chloride flush (NS) 0.9 % injection 10 mL  10 mL Intracatheter PRN [name], MD        SUMMARY OF ONCOLOGIC HISTORY: Oncology History Overview Note  Oncologic Summary: 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  Interval development of large mid abdominal mass highly concerning for right ovarian cancer.  There is mild omental nodularity on the left, and peritoneal disease cannot be completely excluded.  There is no ascites or other evidence of metastatic disease. 2.  Mild associated renal pelvocaliectasis  bilaterally without obstruction.  Nonobstructing left renal calculus and a small right renal angiomyolipoma noted incidentally.   11/08/2012 Pathology Results   1. 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 - BENIGN FALLOPIAN TUBE WITH NONSPECIFIC CHRONIC INFLAMMATION. 2. Ovary and fallopian tube, left - BENIGN OVARY; NEGATIVE FOR ATYPIA OR MALIGNANCY. - BENIGN FALLOPIAN TUBE; NEGATIVE FOR ATYPIA OR MALIGNANCY. 3. Omentum, resection for tumor - HIGH GRADE CARCINOMA, SEE COMMENT. 4. Appendix, Other than Incidental - FIBROUS OBLITERATION OF APPENDICEAL TIP. - NEGATIVE FOR MALIGNANCY.   11/08/2012 Surgery   Surgery: Exploratory laparotomy, bilateral salpingo-oophorectomy, appendectomy, infacolic omentectomy, optimal debulking   Surgeons:  Paola A. Alycia Rossetti, MD; Lahoma Crocker, MD    Assistant: Caswell Corwin  Pathology: Bilateral fallopian tubes and ovaries to pathology. Appendix as well as omentum. 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   Operative findings: 25 cm right adnexal mass with smooth surface. Surgically absent uterus. Atrophic-appearing left ovary. Normal appearing appendix. Within the omentum there were centimeter  nodules scattered throughout the omentum. The remainder of the surfaces were benign.   12/08/2012 Procedure   Impression:  Placement of a subcutaneous port device.  The catheter tip is in the lower SVC and ready to be used.     12/13/2012 - 03/28/2013 Chemotherapy   s/p 6 cycles of paclitaxel and carboplatin   12/13/2012 - 03/28/2013 Chemotherapy   The patient had 6 cycles of carboplatin and Taxol   03/24/2013 Imaging   US abdomen   04/24/2013 Imaging   CT abdomen Interval resection of the large right pelvic and lower abdominal mass  lesion with apparent omentectomy.  No evidence for intraperitoneal free fluid on today's study.  No discernible peritoneal lesions.   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   09/07/2013 Genetic Testing   Patient has genetic testing done for BRCA1/2 panel Results revealed patient has no mutation(s):   07/09/2015 Imaging   CT abdomen 1. 12 mm obstructive calculus at the left ureteropelvic junction with moderate proximal hydronephrosis. 2. 2 small supraumbilical ventral hernias, one containing a short segment of the mid transverse colon and the other containing a short segment of the mid small bowel. There is no associated evidence to suggest bowel incarceration or obstruction at this time. 3. Tiny locule of gas non dependently in the lumen of the urinary bladder. This is presumably iatrogenic related to recent catheterization for urinalysis. Alternatively, this could be seen in the setting of urinary tract infection with gas-forming organisms. Clinical correlation for history of recent catheterization is recommended. 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 5. Status post cholecystectomy. 6. Additional incidental findings, as above.     12/11/2016 Imaging   Ct abdomen 1. No evidence of metastatic ovarian cancer. 2. Recurrent subxiphoid ventral abdominal wall hernia containing transverse colon. No evidence of incarceration or obstruction. 3. Stable incidental findings in the liver and kidneys. No recurrent urinary tract calculus. 4. Progressive lower lumbar spondylosis. 5.  Aortic Atherosclerosis (ICD10-I70.0).     08/30/2017 Imaging   MRI thoracic spine 1. At T5-6 there is a small central disc protrusion contacting the ventral thoracic spinal cord. No central canal or foraminal stenosis. 2. At T9-10 there is a small right paracentral disc protrusion. 3.  No acute osseous injury of the thoracic spine. 4. No aggressive osseous lesion to suggest metastatic  disease.   09/24/2017 Tumor Marker   Patient's tumor was tested for the following markers: CA-125 Results of the tumor marker test revealed 21.7   09/30/2017 Imaging   CT abdomen 1. New small clustered soft tissue nodules in the left lower quadrant in the sigmoid mesentery, largest 1.0 cm, which could represent recurrent peritoneal tumor implants. No ascites.  2. Midline high ventral abdominal wall hernia containing a portion of the transverse colon is mildly increased in size, and without bowel complication at this time. 3. Chronic findings include: Aortic Atherosclerosis (ICD10-I70.0). Diffuse hepatic steatosis. 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   10/11/2017 PET scan   1. Nodules in the sigmoid mesentery are hypermetabolic and highly worrisome for metastatic disease. 2. Attic steatosis.     11/03/2017 Procedure   Successful CT-guided rectus abdominal muscle mass core biopsy. Path: - FOREIGN BODY GIANT CELL REACTION INVOLVING FIBROADIPOSE TISSUE AND SKELETAL MUSCLE. - NO EVIDENCE OF MALIGNANCY.   11/04/2017 Cancer Staging   Staging form: Fallopian Tube, AJCC 7th Edition - Clinical: FIGO Stage IIIC, calculated as Stage IV (rT3, N0, M1) - Signed  by [name], MD on 08/09/2019    02/2018 Imaging   CT: 1. Continued increase in size small peritoneal nodules along the mesenteric border of the proximal sigmoid colon as well as along the serosal surface of the proximal sigmoid colon. 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 2. No evidence of distant disease. 3. Stable large ventral hernia.   05/2018 Imaging   PET: 1. Redemonstration of hypermetabolic nodules within the sigmoid mesocolon. Mild response to therapy relative to CT of 02/24/2018. Mixed response to therapy compared to the most recent PET of 10/11/2017. 2. Hypermetabolism within the right pelvic rectus musculature, increased since the  prior PET.  Clinical service requested comparison to the 11/24/2017 CT. Index 10 mm nodule within the sigmoid mesocolon was similar to the 02/24/2018 CT, and as described on that exam, increased from 7 mm on 11/24/2017. More inferior nodule within the mesocolon measures 12 mm today on image 156/4 and 8 mm on 11/24/2017.   05/2018 Imaging   CT: IMPRESSION: 1. Since 02/24/2018, decreased size of peritoneal nodules centered in the sigmoid mesocolon. 2. No evidence of new or progressive disease. 3. Hepatic steatosis. 4. Subcentimeter right renal angiomyolipoma, similar. 5.  Aortic Atherosclerosis (ICD10-I70.0). 6. Ventral abdominal wall laxity containing transverse colon, similar.   08/2018 Imaging   CT: IMPRESSION: 1. Nodules within the sigmoid mesocolon have decreased in size compared to prior. No new peritoneal or omental nodularity. 2. No evidence local recurrence the pelvis. 3. Ventral hernia contains a segment of transverse colon. No change from prior.     06/19/2019 Imaging   1. No evidence of metastatic disease in the abdomen pelvis. 2. No peritoneal or omental metastasis identified.  No free fluid. 3. Postcholecystectomy and hysterectomy. Comparison exams are made available. Comparison CT 09/08/2018 and 05/27/2018. PET-CT 05/27/2018    There is a nodule within the proximal aspect of the sigmoid colon measuring 2.2 by 2.2 cm. In comparison to prior CTs and PET-CT there was a hypermetabolic nodule at this location on the PET-CT of 08/27/2017 and on the CT of 09/08/2018 there was a small residual nodule. At that time (09/08/2018) the nodule measured 1.4 by 1.3 cm. Therefore this nodule has increased in the interval and concerning for recurrence of a serosal implant. The previous described lymph nodes in the sigmoid mesocolon and more central mesentery are not increased in size and not pathologic by size criteria.    Concern for recurrence of serosal metastasis in the proximal sigmoid  colon with a 2 cm enlarging lesion. Lesion extends into the lumen. No evidence of high-grade obstruction. Consider FDG PET scan and/or potential colonoscopy for evaluation.   06/29/2019 PET scan   1. Enlarging serosal implant within the proximal sigmoid colon has intense metabolic activity most consistent with malignancy. Lesion exhibits a somewhat indolent progression as present on PET-CT scan from 10/11/2017 and 05/27/2018. 2. Focal hypermetabolic activity within the RIGHT rectus muscle just off midline is slightly decreased from comparison exam. This may be benign inflammation related to prior laparotomy, however malignancy not excluded.   07/26/2019 Procedure   She had colonoscopy which showed multiple polyps.  6 polyps were removed from the cecum, measuring 3 to 6 mm in size.  One 12 mm polyp was removed from ascending colon and another 1 measures 7 mm.  One 5 mm polyp was removed from the transverse colon.  There is tumor noted in the sigmoid colon, approximately 35 cm from the anus which was biopsied.  The tumor  appeared to be fungating, infiltrative and ulcerated but nonobstructive.  It encompassed approximately one third of the circumference of the lumen.   07/26/2019 Pathology Results   Multiple polyps came back tubular adenomas.  2 ascending polyps came back sessile serrated adenoma months.  Sigmoid colon biopsy confirmed metastatic high-grade serous carcinoma.  The morphology and immunophenotype are consistent with metastatic high-grade serous carcinoma from tubal/ovarian primary site.   08/17/2019 - 12/12/2019 Chemotherapy   The patient had carboplatin and taxol   10/23/2019 Imaging   1. Sigmoid lesion with diminished size/conspicuity difficult to measure on the previous exam. Adjacent lymph nodes and nodularity less than a cm, largest on coronal image 48 measuring 5 mm. 2. Right rectus muscle with some thickening with mildly convex margin seen posteriorly on image 72 of series 2. This  could be due to postsurgical change, however, metastatic disease is not excluded. Attention on follow-up is suggested. 3. No evidence for new metastatic disease. 4. Small hiatal hernia. 5. 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   Aortic Atherosclerosis (ICD10-I70.0).   01/01/2020 Imaging   1. No signs of new disease. 2. Tiny lymph nodes in the area of concern in the LEFT lower quadrant z. 3. Added density and subtle contour irregularity involving the rectus muscles best seen on sagittal images, associated with site of prior surgical incision just to the RIGHT of midline (image 72, series 2 and image 58, series 5. Not significantly changed compared to prior studies. This measures approximately 2.1 x 1 cm in the sagittal plane and is less well-defined in the axial plane. Potentially postoperative change. Attention on follow-up. 4. Aortic atherosclerosis.   Aortic Atherosclerosis (ICD10-I70.0).       01/29/2020 -  Chemotherapy   Patient is on Treatment Plan : Ovarian Bevacizumab     05/06/2020 Imaging   1. Status post hysterectomy and oophorectomy. 2. No specific findings of recurrent or metastatic disease in the abdomen or pelvis. 3. No change in postoperative appearance of low midline incision. 4. Unchanged small prominent subcentimeter left iliac lymph nodes.   10/22/2020 Imaging   1. Status post hysterectomy and oophorectomy. No evidence of recurrent or metastatic disease within the abdomen or pelvis. 2. No significant change in the subcentimeter prominent left iliac lymph nodes.   04/11/2021 Imaging   1. No findings to suggest recurrent or metastatic disease in the abdomen or pelvis. 2. Aortic atherosclerosis, in addition to at least left anterior descending coronary artery disease. Assessment for potential risk factor modification, dietary therapy or pharmacologic therapy may be warranted, if clinically indicated. 3. Additional incidental findings, as above.   10/15/2021 Imaging    Stable exam. No evidence of recurrent or metastatic carcinoma within the abdomen or pelvis.   Stable tiny benign right renal angiomyolipoma.   Aortic Atherosclerosis (ICD10-I70.0).       PHYSICAL EXAMINATION: ECOG PERFORMANCE STATUS: 1 - Symptomatic but completely ambulatory  Vitals:   10/17/21 1128  BP: 128/74  Pulse: 69  Resp: 18  Temp: 99.3 F (37.4 C)  SpO2: 99%   Filed Weights   10/17/21 1128  Weight: 195 lb 3.2 oz (88.5 kg)    GENERAL:alert, no distress and comfortable NEURO: alert & oriented x 3 with fluent speech, no focal motor/sensory deficits  LABORATORY DATA:  I have reviewed the data as listed    Component Value Date/Time   NA 139 10/15/2021 0744   NA 141 12/02/2016 1219   K 4.1 10/15/2021 0744   K 3.7 12/02/2016 1219  CL 103 10/15/2021 0744   CL 103 01/18/2013 1329   CO2 29 10/15/2021 0744   CO2 27 12/02/2016 1219   GLUCOSE 107 (H) 10/15/2021 0744   GLUCOSE 98 12/02/2016 1219   GLUCOSE 110 (H) 01/18/2013 1329   BUN 16 10/15/2021 0744   BUN 15.3 12/02/2016 1219   CREATININE 0.66 10/15/2021 0744   CREATININE 0.71 05/23/2021 0758   CREATININE 0.7 12/02/2016 1219   CALCIUM 9.7 10/15/2021 0744   CALCIUM 10.1 12/02/2016 1219   PROT 6.8 10/15/2021 0744   PROT 7.1 12/02/2016 1219   ALBUMIN 4.3 10/15/2021 0744   ALBUMIN 4.2 12/02/2016 1219   AST 22 10/15/2021 0744   AST 25 05/23/2021 0758   AST 21 12/02/2016 1219   ALT 23 10/15/2021 0744   ALT 26 05/23/2021 0758   ALT 27 12/02/2016 1219   ALKPHOS 75 10/15/2021 0744   ALKPHOS 104 12/02/2016 1219   BILITOT 0.5 10/15/2021 0744   BILITOT 0.6 05/23/2021 0758   BILITOT 0.37 12/02/2016 1219   GFRNONAA >60 10/15/2021 0744   GFRNONAA >60 05/23/2021 0758   GFRAA >60 05/07/2020 0833    No results found for: SPEP, UPEP  Lab Results  Component Value Date   WBC 5.4 10/15/2021   NEUTROABS 3.0 10/15/2021   HGB 14.0 10/15/2021   HCT 41.2 10/15/2021   MCV 90.0 10/15/2021   PLT 136 (L) 10/15/2021       Chemistry      Component Value Date/Time   NA 139 10/15/2021 0744   NA 141 12/02/2016 1219   K 4.1 10/15/2021 0744   K 3.7 12/02/2016 1219   CL 103 10/15/2021 0744   CL 103 01/18/2013 1329   CO2 29 10/15/2021 0744   CO2 27 12/02/2016 1219   BUN 16 10/15/2021 0744   BUN 15.3 12/02/2016 1219   CREATININE 0.66 10/15/2021 0744   CREATININE 0.71 05/23/2021 0758   CREATININE 0.7 12/02/2016 1219   GLU 236 12/13/2012 1558      Component Value Date/Time   CALCIUM 9.7 10/15/2021 0744   CALCIUM 10.1 12/02/2016 1219   ALKPHOS 75 10/15/2021 0744   ALKPHOS 104 12/02/2016 1219   AST 22 10/15/2021 0744   AST 25 05/23/2021 0758   AST 21 12/02/2016 1219   ALT 23 10/15/2021 0744   ALT 26 05/23/2021 0758   ALT 27 12/02/2016 1219   BILITOT 0.5 10/15/2021 0744   BILITOT 0.6 05/23/2021 0758   BILITOT 0.37 12/02/2016 1219       RADIOGRAPHIC STUDIES: I have personally reviewed the radiological images as listed and agreed with the findings in the report. CT ABDOMEN PELVIS W CONTRAST  Result Date: 10/15/2021 CLINICAL DATA:  Follow-up ovarian carcinoma. Assess treatment response. * onc * EXAM: CT ABDOMEN AND PELVIS WITH CONTRAST TECHNIQUE: Multidetector CT imaging of the abdomen and pelvis was performed using the standard protocol following bolus administration of intravenous contrast. RADIATION DOSE REDUCTION: This exam was performed according to the departmental dose-optimization program which includes automated exposure control, adjustment of the mA and/or kV according to patient size and/or use of iterative reconstruction technique. CONTRAST:  154m OMNIPAQUE IOHEXOL 300 MG/ML  SOLN COMPARISON:  04/10/2021 FINDINGS: Lower Chest: No acute findings. Hepatobiliary: No hepatic masses identified. Prior cholecystectomy. No evidence of biliary obstruction. Pancreas:  No mass or inflammatory changes. Spleen: Within normal limits in size and appearance. Adrenals/Urinary Tract: 1 cm fat attenuation mass  in the midpole of the right kidney is stable, consistent with a benign angiomyolipoma. No  evidence of ureteral calculi or hydronephrosis. Unremarkable unopacified urinary bladder. Stomach/Bowel: No evidence of obstruction, inflammatory process or abnormal fluid collections. Vascular/Lymphatic: No pathologically enlarged lymph nodes. No acute vascular findings. Aortic atherosclerotic calcification noted. Reproductive: Prior hysterectomy noted. Adnexal regions are unremarkable in appearance. Other: None. No evidence of peritoneal thickening or soft tissue nodules. No evidence of ascites. Musculoskeletal:  No suspicious bone lesions identified. IMPRESSION: Stable exam. No evidence of recurrent or metastatic carcinoma within the abdomen or pelvis. Stable tiny benign right renal angiomyolipoma. Aortic Atherosclerosis (ICD10-I70.0). Electronically Signed   By: Marlaine Hind M.D.   On: 10/15/2021 16:37

## 2021-10-17 NOTE — Patient Instructions (Signed)
[facility]  ° Discharge Instructions: °Thank you for choosing [facility] to provide your oncology and hematology care.  ° °If you have a lab appointment with the Cancer Center, please go directly to the Cancer Center and check in at the registration area. °  °Wear comfortable clothing and clothing appropriate for easy access to any Portacath or PICC line.  ° °We strive to give you quality time with your provider. You may need to reschedule your appointment if you arrive late (15 or more minutes).  Arriving late affects you and other patients whose appointments are after yours.  Also, if you miss three or more appointments without notifying the office, you may be dismissed from the clinic at the provider’s discretion.    °  °For prescription refill requests, have your pharmacy contact our office and allow 72 hours for refills to be completed.   ° °Today you received the following chemotherapy and/or immunotherapy agents: Bevacizumab (Avastin)    °  °To help prevent nausea and vomiting after your treatment, we encourage you to take your nausea medication as directed. ° °BELOW ARE SYMPTOMS THAT SHOULD BE REPORTED IMMEDIATELY: °*FEVER GREATER THAN 100.4 F (38 °C) OR HIGHER °*CHILLS OR SWEATING °*NAUSEA AND VOMITING THAT IS NOT CONTROLLED WITH YOUR NAUSEA MEDICATION °*UNUSUAL SHORTNESS OF BREATH °*UNUSUAL BRUISING OR BLEEDING °*URINARY PROBLEMS (pain or burning when urinating, or frequent urination) °*BOWEL PROBLEMS (unusual diarrhea, constipation, pain near the anus) °TENDERNESS IN MOUTH AND THROAT WITH OR WITHOUT PRESENCE OF ULCERS (sore throat, sores in mouth, or a toothache) °UNUSUAL RASH, SWELLING OR PAIN  °UNUSUAL VAGINAL DISCHARGE OR ITCHING  ° °Items with * indicate a potential emergency and should be followed up as soon as possible or go to the Emergency Department if any problems should occur. ° °Please show the CHEMOTHERAPY ALERT CARD or IMMUNOTHERAPY ALERT CARD  at check-in to the Emergency Department and triage nurse. ° °Should you have questions after your visit or need to cancel or reschedule your appointment, please contact [facility]  Dept: [phone]  and follow the prompts.  Office hours are 8:00 a.m. to 4:30 p.m. Monday - Friday. Please note that voicemails left after 4:00 p.m. may not be returned until the following business day.  We are closed weekends and major holidays. You have access to a nurse at all times for urgent questions. Please call the main number to the clinic Dept: [phone] and follow the prompts. ° ° °For any non-urgent questions, you may also contact your provider using MyChart. We now offer e-Visits for anyone 18 and older to request care online for non-urgent symptoms. For details visit [email or web address]. °  °Also download the MyChart app! Go to the app store, search "MyChart", open the app, select [facility], and log in with your MyChart username and password. ° °Due to Covid, a mask is required upon entering the hospital/clinic. If you do not have a mask, one will be given to you upon arrival. For doctor visits, patients may have 1 support person aged 18 or older with them. For treatment visits, patients cannot have anyone with them due to current Covid guidelines and our immunocompromised population.  ° °

## 2021-10-17 NOTE — Assessment & Plan Note (Signed)
Her last CT imaging showed no signs of disease relapse or progression ?She tolerated bevacizumab well without side effects ?The plan will be continue indefinitely ?Due to stability of her cancer, I plan to lengthen the interval between CT imaging ?Her next CT imaging will be in December 2023 ?

## 2021-10-17 NOTE — Assessment & Plan Note (Signed)
She has chronic fibromyalgia and intermittent tingling and pain in her upper extremities ?When I review her cervical MRI from 2010, she is known to have degenerative disc disease ?I recommend consideration for evaluation and management by neurosurgeon ?She will think about ?

## 2021-10-29 ENCOUNTER — Other Ambulatory Visit: Payer: Self-pay | Admitting: Neurology

## 2021-10-29 DIAGNOSIS — F5102 Adjustment insomnia: Secondary | ICD-10-CM

## 2021-10-29 DIAGNOSIS — G4733 Obstructive sleep apnea (adult) (pediatric): Secondary | ICD-10-CM

## 2021-11-05 DIAGNOSIS — E782 Mixed hyperlipidemia: Secondary | ICD-10-CM | POA: Diagnosis not present

## 2021-11-05 DIAGNOSIS — I10 Essential (primary) hypertension: Secondary | ICD-10-CM | POA: Diagnosis not present

## 2021-11-05 DIAGNOSIS — E1159 Type 2 diabetes mellitus with other circulatory complications: Secondary | ICD-10-CM | POA: Diagnosis not present

## 2021-11-06 IMAGING — CT NM PET TUM IMG RESTAG (PS) SKULL BASE T - THIGH
1 of 2 series · 1 of 25 positions shown · non-contrast
Comparison: CT 06/19/2019, 09/08/2018, PET-CT 05/27/2018

CLINICAL DATA: Subsequent treatment strategy for ovarian carcinoma.
Mild increase in tumor markers (CA 125.

EXAM:
NUCLEAR MEDICINE PET SKULL BASE TO THIGH
TECHNIQUE: 9.0 mCi F-18 FDG was injected intravenously. Full-ring PET imaging
was performed from the skull base to thigh after the radiotracer. CT
data was obtained and used for attenuation correction and anatomic
localization.
Fasting blood glucose: 114 mg/dl

[Series 605: range-ct sk_thigh 5.0 (id)<alpha range> · 1 of 174 slices shown]
[im 91/174]
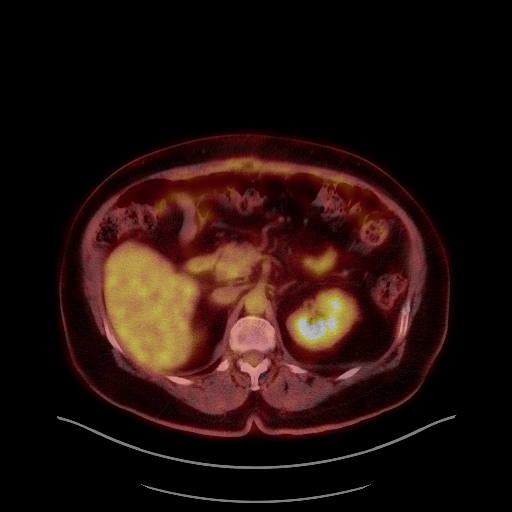

[1 of 25 positions shown; findings below may reference images not displayed]

FINDINGS: Mediastinal blood pool activity: SUV max

Liver activity: SUV max NA

NECK: No hypermetabolic lymph nodes in the neck.

Incidental CT findings: none

CHEST: No hypermetabolic mediastinal or hilar nodes. No suspicious
pulmonary nodules on the CT scan.

Incidental CT findings: none

ABDOMEN/PELVIS: Within the LEFT lower quadrant the nodule of concern
associated the proximal sigmoid colon measures 2.6 by 1.9 cm and has
intense metabolic activity with SUV max equal 14.9. On comparison CT
from PET-CT 05/27/2018 lesion measured approximately 1.3 by 1.2 cm
with SUV max equal 7.3 . Of note this lesion was also present and
described on PET-CT scan of 10/11/2017.

Within the RIGHT rectus abdominus muscle just RIGHT of midline there
is a focus of metabolic activity SUV max equal 5.1. This compares to
SUV max equal 6.3 on most recent PET-CT scan. Subtle nodular
thickening through this region (image 159/series 4).

No additional foci of abnormal metabolic activity abdomen pelvis. No
hypermetabolic lymph nodes in the sigmoid mesocolon. No
retroperitoneal adenopathy. No liver disease. Peritoneal disease
otherwise.

Physiologic activity noted within the bowel

Incidental CT findings: none

SKELETON: No focal hypermetabolic activity to suggest skeletal
metastasis.

Incidental CT findings: none
IMPRESSION: 1. Enlarging serosal implant within the proximal sigmoid colon has
intense metabolic activity most consistent with malignancy. Lesion
exhibits a somewhat indolent progression as present on PET-CT scan
from 10/11/2017 and 05/27/2018.
2. Focal hypermetabolic activity within the RIGHT rectus muscle just
off midline is slightly decreased from comparison exam. This may be
benign inflammation related to prior laparotomy, however malignancy
not excluded.

## 2021-11-07 ENCOUNTER — Inpatient Hospital Stay: Payer: Medicare HMO

## 2021-11-07 ENCOUNTER — Other Ambulatory Visit: Payer: Self-pay

## 2021-11-07 VITALS — BP 155/74 | HR 66 | Temp 97.7°F | Resp 20 | Wt 185.8 lb

## 2021-11-07 DIAGNOSIS — C5701 Malignant neoplasm of right fallopian tube: Secondary | ICD-10-CM

## 2021-11-07 DIAGNOSIS — Z5111 Encounter for antineoplastic chemotherapy: Secondary | ICD-10-CM | POA: Diagnosis not present

## 2021-11-07 DIAGNOSIS — C187 Malignant neoplasm of sigmoid colon: Secondary | ICD-10-CM | POA: Diagnosis not present

## 2021-11-07 DIAGNOSIS — Z7189 Other specified counseling: Secondary | ICD-10-CM

## 2021-11-07 DIAGNOSIS — C786 Secondary malignant neoplasm of retroperitoneum and peritoneum: Secondary | ICD-10-CM | POA: Diagnosis not present

## 2021-11-07 MED ORDER — SODIUM CHLORIDE 0.9% FLUSH
10.0000 mL | INTRAVENOUS | Status: DC | PRN
  Administered 2021-11-07: 10 mL

## 2021-11-07 MED ORDER — SODIUM CHLORIDE 0.9 % IV SOLN: Freq: Once | INTRAVENOUS | Status: AC

## 2021-11-07 MED ORDER — SODIUM CHLORIDE 0.9 % IV SOLN
15.0000 mg/kg | Freq: Once | INTRAVENOUS | Status: AC
  Administered 2021-11-07: 1300 mg via INTRAVENOUS
  Filled 2021-11-07: qty 48

## 2021-11-07 MED ORDER — HEPARIN SOD (PORK) LOCK FLUSH 100 UNIT/ML IV SOLN
500.0000 [IU] | Freq: Once | INTRAVENOUS | Status: AC | PRN
  Administered 2021-11-07: 500 [IU]

## 2021-11-07 NOTE — Patient Instructions (Signed)
[facility]  Discharge Instructions: Thank you for choosing [facility] to provide your oncology and hematology care.   If you have a lab appointment with the Cancer Center, please go directly to the Cancer Center and check in at the registration area.   Wear comfortable clothing and clothing appropriate for easy access to any Portacath or PICC line.   We strive to give you quality time with your provider. You may need to reschedule your appointment if you arrive late (15 or more minutes).  Arriving late affects you and other patients whose appointments are after yours.  Also, if you miss three or more appointments without notifying the office, you may be dismissed from the clinic at the provider's discretion.      For prescription refill requests, have your pharmacy contact our office and allow 72 hours for refills to be completed.    Today you received the following chemotherapy and/or immunotherapy agents Bevacizumab-bvzr (Zirabev).      To help prevent nausea and vomiting after your treatment, we encourage you to take your nausea medication as directed.  BELOW ARE SYMPTOMS THAT SHOULD BE REPORTED IMMEDIATELY: *FEVER GREATER THAN 100.4 F (38 C) OR HIGHER *CHILLS OR SWEATING *NAUSEA AND VOMITING THAT IS NOT CONTROLLED WITH YOUR NAUSEA MEDICATION *UNUSUAL SHORTNESS OF BREATH *UNUSUAL BRUISING OR BLEEDING *URINARY PROBLEMS (pain or burning when urinating, or frequent urination) *BOWEL PROBLEMS (unusual diarrhea, constipation, pain near the anus) TENDERNESS IN MOUTH AND THROAT WITH OR WITHOUT PRESENCE OF ULCERS (sore throat, sores in mouth, or a toothache) UNUSUAL RASH, SWELLING OR PAIN  UNUSUAL VAGINAL DISCHARGE OR ITCHING   Items with * indicate a potential emergency and should be followed up as soon as possible or go to the Emergency Department if any problems should occur.  Please show the CHEMOTHERAPY ALERT CARD or IMMUNOTHERAPY ALERT  CARD at check-in to the Emergency Department and triage nurse.  Should you have questions after your visit or need to cancel or reschedule your appointment, please contact [facility]  Dept: [phone]  and follow the prompts.  Office hours are 8:00 a.m. to 4:30 p.m. Monday - Friday. Please note that voicemails left after 4:00 p.m. may not be returned until the following business day.  We are closed weekends and major holidays. You have access to a nurse at all times for urgent questions. Please call the main number to the clinic Dept: [phone] and follow the prompts.   For any non-urgent questions, you may also contact your provider using MyChart. We now offer e-Visits for anyone 18 and older to request care online for non-urgent symptoms. For details visit [email or web address].   Also download the MyChart app! Go to the app store, search "MyChart", open the app, select [facility], and log in with your MyChart username and password.  Due to Covid, a mask is required upon entering the hospital/clinic. If you do not have a mask, one will be given to you upon arrival. For doctor visits, patients may have 1 support person aged 18 or older with them. For treatment visits, patients cannot have anyone with them due to current Covid guidelines and our immunocompromised population.   

## 2021-11-12 ENCOUNTER — Other Ambulatory Visit: Payer: Self-pay | Admitting: Neurology

## 2021-11-12 DIAGNOSIS — F5102 Adjustment insomnia: Secondary | ICD-10-CM

## 2021-11-12 DIAGNOSIS — Z9989 Dependence on other enabling machines and devices: Secondary | ICD-10-CM

## 2021-11-19 DIAGNOSIS — E1159 Type 2 diabetes mellitus with other circulatory complications: Secondary | ICD-10-CM | POA: Diagnosis not present

## 2021-11-19 DIAGNOSIS — E782 Mixed hyperlipidemia: Secondary | ICD-10-CM | POA: Diagnosis not present

## 2021-11-19 DIAGNOSIS — E673 Hypervitaminosis D: Secondary | ICD-10-CM | POA: Diagnosis not present

## 2021-11-20 ENCOUNTER — Other Ambulatory Visit: Payer: Self-pay | Admitting: Neurology

## 2021-11-20 DIAGNOSIS — F5102 Adjustment insomnia: Secondary | ICD-10-CM

## 2021-11-20 DIAGNOSIS — G4733 Obstructive sleep apnea (adult) (pediatric): Secondary | ICD-10-CM

## 2021-11-20 MED ORDER — ESZOPICLONE 2 MG PO TABS: 2.0000 mg | ORAL_TABLET | Freq: Every day | ORAL | 0 refills | Status: DC

## 2021-11-20 NOTE — Telephone Encounter (Signed)
Pt requesting refill of eszopiclone (LUNESTA) 2 MG TABS tablet at [facility]. ? ?

## 2021-11-24 DIAGNOSIS — F411 Generalized anxiety disorder: Secondary | ICD-10-CM | POA: Diagnosis not present

## 2021-11-24 DIAGNOSIS — I10 Essential (primary) hypertension: Secondary | ICD-10-CM | POA: Diagnosis not present

## 2021-11-24 DIAGNOSIS — K529 Noninfective gastroenteritis and colitis, unspecified: Secondary | ICD-10-CM | POA: Diagnosis not present

## 2021-11-24 DIAGNOSIS — E782 Mixed hyperlipidemia: Secondary | ICD-10-CM | POA: Diagnosis not present

## 2021-11-24 DIAGNOSIS — G47419 Narcolepsy without cataplexy: Secondary | ICD-10-CM | POA: Diagnosis not present

## 2021-11-24 DIAGNOSIS — Z23 Encounter for immunization: Secondary | ICD-10-CM | POA: Diagnosis not present

## 2021-11-24 DIAGNOSIS — E1159 Type 2 diabetes mellitus with other circulatory complications: Secondary | ICD-10-CM | POA: Diagnosis not present

## 2021-11-24 DIAGNOSIS — G4733 Obstructive sleep apnea (adult) (pediatric): Secondary | ICD-10-CM | POA: Diagnosis not present

## 2021-11-25 DIAGNOSIS — Z0100 Encounter for examination of eyes and vision without abnormal findings: Secondary | ICD-10-CM | POA: Diagnosis not present

## 2021-11-25 DIAGNOSIS — E119 Type 2 diabetes mellitus without complications: Secondary | ICD-10-CM | POA: Diagnosis not present

## 2021-11-25 DIAGNOSIS — H43813 Vitreous degeneration, bilateral: Secondary | ICD-10-CM | POA: Diagnosis not present

## 2021-11-25 DIAGNOSIS — Z961 Presence of intraocular lens: Secondary | ICD-10-CM | POA: Diagnosis not present

## 2021-11-25 DIAGNOSIS — H16223 Keratoconjunctivitis sicca, not specified as Sjogren's, bilateral: Secondary | ICD-10-CM | POA: Diagnosis not present

## 2021-11-25 DIAGNOSIS — H5111 Convergence insufficiency: Secondary | ICD-10-CM | POA: Diagnosis not present

## 2021-11-28 ENCOUNTER — Inpatient Hospital Stay: Payer: Medicare HMO

## 2021-11-28 ENCOUNTER — Inpatient Hospital Stay: Payer: Medicare HMO | Attending: Gynecologic Oncology

## 2021-11-28 ENCOUNTER — Other Ambulatory Visit: Payer: Self-pay

## 2021-11-28 ENCOUNTER — Encounter: Payer: Self-pay | Admitting: Hematology and Oncology

## 2021-11-28 ENCOUNTER — Inpatient Hospital Stay (HOSPITAL_BASED_OUTPATIENT_CLINIC_OR_DEPARTMENT_OTHER): Payer: Medicare HMO | Admitting: Hematology and Oncology

## 2021-11-28 ENCOUNTER — Other Ambulatory Visit: Payer: Self-pay | Admitting: Hematology and Oncology

## 2021-11-28 VITALS — BP 161/79 | HR 64 | Temp 97.5°F | Resp 16 | Wt 198.2 lb

## 2021-11-28 VITALS — BP 165/90 | HR 62

## 2021-11-28 DIAGNOSIS — Z7189 Other specified counseling: Secondary | ICD-10-CM

## 2021-11-28 DIAGNOSIS — D696 Thrombocytopenia, unspecified: Secondary | ICD-10-CM | POA: Insufficient documentation

## 2021-11-28 DIAGNOSIS — C5701 Malignant neoplasm of right fallopian tube: Secondary | ICD-10-CM

## 2021-11-28 DIAGNOSIS — Z5111 Encounter for antineoplastic chemotherapy: Secondary | ICD-10-CM | POA: Insufficient documentation

## 2021-11-28 DIAGNOSIS — C5700 Malignant neoplasm of unspecified fallopian tube: Secondary | ICD-10-CM

## 2021-11-28 DIAGNOSIS — I10 Essential (primary) hypertension: Secondary | ICD-10-CM | POA: Insufficient documentation

## 2021-11-28 LAB — COMPREHENSIVE METABOLIC PANEL
ALT: 24 U/L (ref 0–44)
AST: 23 U/L (ref 15–41)
Albumin: 4.1 g/dL (ref 3.5–5.0)
Alkaline Phosphatase: 77 U/L (ref 38–126)
Anion gap: 7 (ref 5–15)
BUN: 18 mg/dL (ref 8–23)
CO2: 29 mmol/L (ref 22–32)
Calcium: 10.2 mg/dL (ref 8.9–10.3)
Chloride: 104 mmol/L (ref 98–111)
Creatinine, Ser: 0.77 mg/dL (ref 0.44–1.00)
GFR, Estimated: >60 mL/min (ref >60)
Glucose, Bld: 108 mg/dL — ABNORMAL HIGH (ref 70–99)
Potassium: 4.1 mmol/L (ref 3.5–5.1)
Sodium: 140 mmol/L (ref 135–145)
Total Bilirubin: 0.6 mg/dL (ref 0.3–1.2)
Total Protein: 6.9 g/dL (ref 6.5–8.1)

## 2021-11-28 LAB — CBC WITH DIFFERENTIAL/PLATELET
Abs Immature Granulocytes: 0.02 10*3/uL (ref 0.00–0.07)
Basophils Absolute: 0 10*3/uL (ref 0.0–0.1)
Basophils Relative: 0 %
Eosinophils Absolute: 0.1 10*3/uL (ref 0.0–0.5)
Eosinophils Relative: 2 %
HCT: 41.8 % (ref 36.0–46.0)
Hemoglobin: 13.7 g/dL (ref 12.0–15.0)
Immature Granulocytes: 0 %
Lymphocytes Relative: 32 %
Lymphs Abs: 1.7 10*3/uL (ref 0.7–4.0)
MCH: 29.6 pg (ref 26.0–34.0)
MCHC: 32.8 g/dL (ref 30.0–36.0)
MCV: 90.3 fL (ref 80.0–100.0)
Monocytes Absolute: 0.5 10*3/uL (ref 0.1–1.0)
Monocytes Relative: 10 %
Neutro Abs: 3 10*3/uL (ref 1.7–7.7)
Neutrophils Relative %: 56 %
Platelets: 135 10*3/uL — ABNORMAL LOW (ref 150–400)
RBC: 4.63 MIL/uL (ref 3.87–5.11)
RDW: 14.1 % (ref 11.5–15.5)
WBC: 5.4 10*3/uL (ref 4.0–10.5)
nRBC: 0 % (ref 0.0–0.2)

## 2021-11-28 LAB — TOTAL PROTEIN, URINE DIPSTICK: Protein, ur: <30 mg/dL

## 2021-11-28 MED ORDER — HEPARIN SOD (PORK) LOCK FLUSH 100 UNIT/ML IV SOLN
500.0000 [IU] | Freq: Once | INTRAVENOUS | Status: AC | PRN
  Administered 2021-11-28: 500 [IU]

## 2021-11-28 MED ORDER — SODIUM CHLORIDE 0.9 % IV SOLN: Freq: Once | INTRAVENOUS | Status: AC

## 2021-11-28 MED ORDER — SODIUM CHLORIDE 0.9% FLUSH
10.0000 mL | Freq: Once | INTRAVENOUS | Status: AC
  Administered 2021-11-28: 10 mL

## 2021-11-28 MED ORDER — SODIUM CHLORIDE 0.9% FLUSH
10.0000 mL | INTRAVENOUS | Status: DC | PRN
  Administered 2021-11-28: 10 mL

## 2021-11-28 MED ORDER — SODIUM CHLORIDE 0.9 % IV SOLN
15.0000 mg/kg | Freq: Once | INTRAVENOUS | Status: AC
  Administered 2021-11-28: 1300 mg via INTRAVENOUS
  Filled 2021-11-28: qty 48

## 2021-11-28 NOTE — Assessment & Plan Note (Signed)
Her last CT imaging showed no signs of disease relapse or progression ?She tolerated bevacizumab well without side effects ?The plan will be continue indefinitely ?Due to stability of her cancer, I plan to lengthen the interval between CT imaging ?Her next CT imaging will be in December 2023 ?

## 2021-11-28 NOTE — Progress Notes (Signed)
Per Dr. Lurlean Leyden, okay to proceed with treatment today with blood pressure 165/90. ?

## 2021-11-28 NOTE — Assessment & Plan Note (Signed)
Her thrombocytopenia is stable Observe only for now 

## 2021-11-28 NOTE — Patient Instructions (Signed)
[facility]  Discharge Instructions: Thank you for choosing [facility] to provide your oncology and hematology care.   If you have a lab appointment with the Cancer Center, please go directly to the Cancer Center and check in at the registration area.   Wear comfortable clothing and clothing appropriate for easy access to any Portacath or PICC line.   We strive to give you quality time with your provider. You may need to reschedule your appointment if you arrive late (15 or more minutes).  Arriving late affects you and other patients whose appointments are after yours.  Also, if you miss three or more appointments without notifying the office, you may be dismissed from the clinic at the provider's discretion.      For prescription refill requests, have your pharmacy contact our office and allow 72 hours for refills to be completed.    Today you received the following chemotherapy and/or immunotherapy agents Bevacizumab-bvzr (Zirabev).      To help prevent nausea and vomiting after your treatment, we encourage you to take your nausea medication as directed.  BELOW ARE SYMPTOMS THAT SHOULD BE REPORTED IMMEDIATELY: *FEVER GREATER THAN 100.4 F (38 C) OR HIGHER *CHILLS OR SWEATING *NAUSEA AND VOMITING THAT IS NOT CONTROLLED WITH YOUR NAUSEA MEDICATION *UNUSUAL SHORTNESS OF BREATH *UNUSUAL BRUISING OR BLEEDING *URINARY PROBLEMS (pain or burning when urinating, or frequent urination) *BOWEL PROBLEMS (unusual diarrhea, constipation, pain near the anus) TENDERNESS IN MOUTH AND THROAT WITH OR WITHOUT PRESENCE OF ULCERS (sore throat, sores in mouth, or a toothache) UNUSUAL RASH, SWELLING OR PAIN  UNUSUAL VAGINAL DISCHARGE OR ITCHING   Items with * indicate a potential emergency and should be followed up as soon as possible or go to the Emergency Department if any problems should occur.  Please show the CHEMOTHERAPY ALERT CARD or IMMUNOTHERAPY ALERT  CARD at check-in to the Emergency Department and triage nurse.  Should you have questions after your visit or need to cancel or reschedule your appointment, please contact [facility]  Dept: [phone]  and follow the prompts.  Office hours are 8:00 a.m. to 4:30 p.m. Monday - Friday. Please note that voicemails left after 4:00 p.m. may not be returned until the following business day.  We are closed weekends and major holidays. You have access to a nurse at all times for urgent questions. Please call the main number to the clinic Dept: [phone] and follow the prompts.   For any non-urgent questions, you may also contact your provider using MyChart. We now offer e-Visits for anyone 18 and older to request care online for non-urgent symptoms. For details visit [email or web address].   Also download the MyChart app! Go to the app store, search "MyChart", open the app, select [place] [facility], and log in with your MyChart username and password.  Due to Covid, a mask is required upon entering the hospital/clinic. If you do not have a mask, one will be given to you upon arrival. For doctor visits, patients may have 1 support person aged 18 or older with them. For treatment visits, patients cannot have anyone with them due to current Covid guidelines and our immunocompromised population.   

## 2021-11-28 NOTE — Progress Notes (Signed)
[facility] ?OFFICE PROGRESS NOTE ? ?Patient Care Team: ?Cari Caraway, MD as PCP - General (Family Medicine) ?Cari Caraway, MD as Attending Physician (Family Medicine) ? 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 ?Thrombocytopenia ([place]) ?Her thrombocytopenia is stable ?Observe only for now ? ?Essential hypertension ?Her blood pressure is well controlled ?Her blood pressure is high today could be due to anxiety ?She will continue close monitoring ?We will proceed with treatment without delay ? ?Orders Placed This Encounter  ?Procedures  ? CBC with Differential/Platelet  ?  Standing Status:   Standing  ?  Number of Occurrences:   19  ?  Standing Expiration Date:   11/29/2022  ? ? ?All questions were answered. The patient knows to call the clinic with any problems, questions or concerns. ?The total time spent in the appointment was 20 minutes encounter with patients including review of chart and various tests results, discussions about plan of care and coordination of care plan ?  ?[name], MD ?11/28/2021 10:32 AM ? ?INTERVAL HISTORY: ?Please see below for problem oriented charting. 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 ?REVIEW OF SYSTEMS:   ?Constitutional: Denies fevers, chills or abnormal weight loss ?Eyes: Denies blurriness of vision ?Ears, nose, mouth, throat, and face: Denies mucositis or sore throat ?Respiratory: Denies cough, dyspnea or wheezes ?Cardiovascular: Denies palpitation, chest  discomfort or lower extremity swelling ?Gastrointestinal:  Denies nausea, heartburn or change in bowel habits ?Skin: Denies abnormal skin rashes ?Lymphatics: Denies new lymphadenopathy or easy bruising ?Neurological:Denies numbness, tingling or new weaknesses ?Behavioral/Psych: Mood is stable, no new changes  ?All other systems were reviewed with the patient and are negative. ? ?I have reviewed the past medical history, past surgical history, social history and family history with the patient and they are unchanged from previous note. ? ?ALLERGIES:  is allergic to [employer] tartrate], bee venom, and cortisone. ? ?MEDICATIONS:  ?Current Outpatient Medications  ?Medication Sig Dispense Refill  ? amLODipine (NORVASC) 10 MG tablet Take 1 tablet (10 mg total) by mouth daily. 30 tablet 11  ? Armodafinil 250 MG tablet TAKE ONE TABLET BY MOUTH EVERY MORNING 90 tablet 1  ? bevacizumab in sodium chloride 0.9 % 100 mL Inject into the vein once.    ? busPIRone (BUSPAR) 7.5 MG tablet Take 15 mg by mouth 2 (two) times daily.    ? chlorthalidone (HYGROTON) 25 MG tablet Take 25 mg by mouth daily.    ? cholecalciferol (VITAMIN D3) 25 MCG (1000 UT) tablet Take 1,000 Units by mouth daily.    ? colestipol (COLESTID) 1 g tablet Take 2 g by mouth at bedtime.    ? eszopiclone (LUNESTA) 2 MG TABS tablet Take 1 tablet (2 mg total) by mouth at bedtime. TAKE ONE TABLET BY MOUTH EVERYDAY AT BEDTIME 60 tablet 0  ? FIBER PO Take by mouth.    ? FLUoxetine (PROZAC) 20 MG capsule Take 40 mg by mouth every morning. Takes with a 64m capsule for her total dose of 31m    ?  lidocaine-prilocaine (EMLA) cream Apply 1 application topically as needed. 30 g 1  ? MELATONIN PO Take 10 mg by mouth at bedtime.     ? metFORMIN (GLUCOPHAGE-XR) 500 MG 24 hr tablet Take 500 mg by mouth daily with breakfast.   3  ? metoprolol tartrate (LOPRESSOR) 25 MG tablet Take 1 tablet (25 mg total) by mouth 2 (two) times daily. 60 tablet 3  ? modafinil (PROVIGIL) 200  MG tablet TAKE ONE TABLET BY MOUTH EVERY MORNING 30 tablet 5  ? Multiple Vitamin (MULTIVITAMIN WITH MINERALS) TABS Take 1 tablet by mouth every morning.     ? rosuvastatin (CRESTOR) 40 MG tablet     ? telmisartan (MICARDIS) 40 MG tablet Take 40 mg by mouth daily.    ? ?No current facility-administered medications for this visit.  ? ?Facility-Administered Medications Ordered in Other Visits  ?Medication Dose Route Frequency Provider Last Rate Last Admin  ? heparin lock flush 100 unit/mL  500 Units Intracatheter Once PRN Alvy Bimler, Ni, MD      ? sodium chloride flush (NS) 0.9 % injection 10 mL  10 mL Intravenous PRN Nancy Marus, MD   10 mL at 05/12/17 0263  ? sodium chloride flush (NS) 0.9 % injection 10 mL  10 mL Intracatheter PRN [name], MD      ? ? 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 Ct abdomen ?1.  Interval development of large mid abdominal mass highly concerning for right ovarian cancer.  There is mild omental nodularity on the left, and peritoneal disease cannot be completely excluded.  There is no ascites or other evidence of metastatic disease. ?2.  Mild associated renal pelvocaliectasis bilaterally without obstruction.  Nonobstructing left renal calculus and a small right renal angiomyolipoma noted incidentally. ? ?  ?11/08/2012 Pathology Results  ? 1. Ovary and fallopian tube, right ?- OVARIAN ATYPICAL  PROLIFERATING MUCINOUS TUMOR (BORDERLINE TUMOR) (28 CM), SEE ?COMMENT. ?- HIGH GRADE SEROUS CARCINOMA, 1.5 CM, CENTERED IN FALLOPIAN TUBE FIMBRIA. ?- BENIGN FALLOPIAN TUBE WITH NONSPECIFIC CHRONIC INFLAMMATION. ?2. Ovary and fallopian tube, left ?- BENIGN OVARY; NEGATIVE FOR ATYPIA OR MALIGNANCY. ?- BENIGN FALLOPIAN TUBE; NEGATIVE FOR ATYPIA OR MALIGNANCY. ?3. Omentum, resection for tumor ?- HIGH GRADE CARCINOMA, SEE COMMENT. ?4. Appendix, Other than Incidental ?- FIBROUS OBLITERATION OF APPENDICEAL TIP. ?- NEGATIVE FOR MALIGNANCY. ? ?  ?11/08/2012 Surgery  ? Surgery: Exploratory laparotomy, bilateral salpingo-oophorectomy, appendectomy, infacolic omentectomy, optimal debulking ?  ?Surgeons:  Lucita Lora. Alycia Rossetti, MD; Lahoma Crocker, MD  ?  ?Assistant: Caswell Corwin  ?Pathology: Bilateral fallopian tubes and ovaries to pathology. Appendix as well as omentum. 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 ?  ?Operative findings: 25 cm right adnexal mass with smooth surface. Surgically absent uterus. Atrophic-appearing left ovary. Normal appearing appendix. Within the omentum there were centimeter nodules scattered throughout the omentum. The remainder of the surfaces were benign. ? ?  ?12/08/2012 Procedure  ? Impression:  Placement of a  subcutaneous port device.  The catheter tip is in the lower SVC and ready to be used. ?  ? ?  ?12/13/2012 - 03/28/2013 Chemotherapy  ? s/p 6 cycles of paclitaxel and carboplatin ? ?  ?12/13/2012 - 03/28/2013 Chemotherapy  ? The patient had 6 cycles of carboplatin and Taxol ? ?  ?03/24/2013 Imaging  ? US abdomen ? ?  ?04/24/2013 Imaging  ? CT abdomen ?Interval resection of the large right pelvic and lower abdominal mass lesion with apparent omentectomy.  No evidence for intraperitoneal free fluid on today's study.  No discernible peritoneal lesions. ?  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 ? ?  ?09/07/2013 Genetic  Testing  ? Patient has genetic testing done for BRCA1/2 panel ?Results revealed patient has no mutation(s): ? ?  ?07/09/2015 Imaging  ? CT abdomen ?1. 12 mm obstructive calculus at the left ureteropelv

## 2021-11-28 NOTE — Assessment & Plan Note (Signed)
Her blood pressure is well controlled ?Her blood pressure is high today could be due to anxiety ?She will continue close monitoring ?We will proceed with treatment without delay ?

## 2021-12-04 DIAGNOSIS — E782 Mixed hyperlipidemia: Secondary | ICD-10-CM | POA: Diagnosis not present

## 2021-12-04 DIAGNOSIS — I10 Essential (primary) hypertension: Secondary | ICD-10-CM | POA: Diagnosis not present

## 2021-12-04 DIAGNOSIS — E1159 Type 2 diabetes mellitus with other circulatory complications: Secondary | ICD-10-CM | POA: Diagnosis not present

## 2021-12-16 DIAGNOSIS — R800 Isolated proteinuria: Secondary | ICD-10-CM | POA: Diagnosis not present

## 2021-12-18 ENCOUNTER — Other Ambulatory Visit: Payer: Self-pay

## 2021-12-18 ENCOUNTER — Inpatient Hospital Stay: Payer: Medicare HMO

## 2021-12-18 ENCOUNTER — Encounter: Payer: Self-pay | Admitting: Internal Medicine

## 2021-12-18 ENCOUNTER — Inpatient Hospital Stay: Payer: Medicare HMO | Attending: Gynecologic Oncology

## 2021-12-18 ENCOUNTER — Ambulatory Visit (INDEPENDENT_AMBULATORY_CARE_PROVIDER_SITE_OTHER): Payer: Medicare HMO | Admitting: Internal Medicine

## 2021-12-18 VITALS — BP 131/75 | HR 61 | Temp 98.5°F | Resp 18 | Wt 195.8 lb

## 2021-12-18 VITALS — BP 124/76 | HR 75 | Ht 59.5 in | Wt 131.0 lb

## 2021-12-18 DIAGNOSIS — C5701 Malignant neoplasm of right fallopian tube: Secondary | ICD-10-CM

## 2021-12-18 DIAGNOSIS — Z5111 Encounter for antineoplastic chemotherapy: Secondary | ICD-10-CM | POA: Diagnosis not present

## 2021-12-18 DIAGNOSIS — C5700 Malignant neoplasm of unspecified fallopian tube: Secondary | ICD-10-CM

## 2021-12-18 DIAGNOSIS — Z7189 Other specified counseling: Secondary | ICD-10-CM

## 2021-12-18 DIAGNOSIS — I2510 Atherosclerotic heart disease of native coronary artery without angina pectoris: Secondary | ICD-10-CM | POA: Diagnosis not present

## 2021-12-18 LAB — CBC WITH DIFFERENTIAL/PLATELET
Abs Immature Granulocytes: 0.01 10*3/uL (ref 0.00–0.07)
Basophils Absolute: 0 10*3/uL (ref 0.0–0.1)
Basophils Relative: 1 %
Eosinophils Absolute: 0.1 10*3/uL (ref 0.0–0.5)
Eosinophils Relative: 3 %
HCT: 41.6 % (ref 36.0–46.0)
Hemoglobin: 13.5 g/dL (ref 12.0–15.0)
Immature Granulocytes: 0 %
Lymphocytes Relative: 35 %
Lymphs Abs: 1.6 10*3/uL (ref 0.7–4.0)
MCH: 29.6 pg (ref 26.0–34.0)
MCHC: 32.5 g/dL (ref 30.0–36.0)
MCV: 91.2 fL (ref 80.0–100.0)
Monocytes Absolute: 0.5 10*3/uL (ref 0.1–1.0)
Monocytes Relative: 12 %
Neutro Abs: 2.2 10*3/uL (ref 1.7–7.7)
Neutrophils Relative %: 49 %
Platelets: 123 10*3/uL — ABNORMAL LOW (ref 150–400)
RBC: 4.56 MIL/uL (ref 3.87–5.11)
RDW: 13.8 % (ref 11.5–15.5)
WBC: 4.4 10*3/uL (ref 4.0–10.5)
nRBC: 0 % (ref 0.0–0.2)

## 2021-12-18 LAB — COMPREHENSIVE METABOLIC PANEL
ALT: 23 U/L (ref 0–44)
AST: 22 U/L (ref 15–41)
Albumin: 4.2 g/dL (ref 3.5–5.0)
Alkaline Phosphatase: 70 U/L (ref 38–126)
Anion gap: 6 (ref 5–15)
BUN: 28 mg/dL — ABNORMAL HIGH (ref 8–23)
CO2: 29 mmol/L (ref 22–32)
Calcium: 10.1 mg/dL (ref 8.9–10.3)
Chloride: 106 mmol/L (ref 98–111)
Creatinine, Ser: 0.92 mg/dL (ref 0.44–1.00)
GFR, Estimated: >60 mL/min (ref >60)
Glucose, Bld: 109 mg/dL — ABNORMAL HIGH (ref 70–99)
Potassium: 4.3 mmol/L (ref 3.5–5.1)
Sodium: 141 mmol/L (ref 135–145)
Total Bilirubin: 0.4 mg/dL (ref 0.3–1.2)
Total Protein: 6.9 g/dL (ref 6.5–8.1)

## 2021-12-18 LAB — TOTAL PROTEIN, URINE DIPSTICK: Protein, ur: 30 mg/dL — AB

## 2021-12-18 MED ORDER — SODIUM CHLORIDE 0.9% FLUSH
10.0000 mL | INTRAVENOUS | Status: DC | PRN
  Administered 2021-12-18: 10 mL

## 2021-12-18 MED ORDER — SODIUM CHLORIDE 0.9% FLUSH
10.0000 mL | Freq: Once | INTRAVENOUS | Status: AC
  Administered 2021-12-18: 10 mL

## 2021-12-18 MED ORDER — HEPARIN SOD (PORK) LOCK FLUSH 100 UNIT/ML IV SOLN
500.0000 [IU] | Freq: Once | INTRAVENOUS | Status: AC | PRN
  Administered 2021-12-18: 500 [IU]

## 2021-12-18 MED ORDER — SODIUM CHLORIDE 0.9 % IV SOLN
15.0000 mg/kg | Freq: Once | INTRAVENOUS | Status: AC
  Administered 2021-12-18: 1300 mg via INTRAVENOUS
  Filled 2021-12-18: qty 48

## 2021-12-18 MED ORDER — SODIUM CHLORIDE 0.9 % IV SOLN: Freq: Once | INTRAVENOUS | Status: AC

## 2021-12-18 NOTE — Patient Instructions (Signed)
[facility] MEDICAL [facility]COLOGY  Discharge Instructions: Thank you for choosing [facility] to provide your oncology and hematology care.   If you have a lab appointment with the Cancer Center, please go directly to the Cancer Center and check in at the registration area.   Wear comfortable clothing and clothing appropriate for easy access to any Portacath or PICC line.   We strive to give you quality time with your provider. You may need to reschedule your appointment if you arrive late (15 or more minutes).  Arriving late affects you and other patients whose appointments are after yours.  Also, if you miss three or more appointments without notifying the office, you may be dismissed from the clinic at the provider's discretion.      For prescription refill requests, have your pharmacy contact our office and allow 72 hours for refills to be completed.    Today you received the following chemotherapy and/or immunotherapy agents Bevacizumab-bvzr (Zirabev).      To help prevent nausea and vomiting after your treatment, we encourage you to take your nausea medication as directed.  BELOW ARE SYMPTOMS THAT SHOULD BE REPORTED IMMEDIATELY: *FEVER GREATER THAN 100.4 F (38 C) OR HIGHER *CHILLS OR SWEATING *NAUSEA AND VOMITING THAT IS NOT CONTROLLED WITH YOUR NAUSEA MEDICATION *UNUSUAL SHORTNESS OF BREATH *UNUSUAL BRUISING OR BLEEDING *URINARY PROBLEMS (pain or burning when urinating, or frequent urination) *BOWEL PROBLEMS (unusual diarrhea, constipation, pain near the anus) TENDERNESS IN MOUTH AND THROAT WITH OR WITHOUT PRESENCE OF ULCERS (sore throat, sores in mouth, or a toothache) UNUSUAL RASH, SWELLING OR PAIN  UNUSUAL VAGINAL DISCHARGE OR ITCHING   Items with * indicate a potential emergency and should be followed up as soon as possible or go to the Emergency Department if any problems should occur.  Please show the CHEMOTHERAPY ALERT CARD or IMMUNOTHERAPY ALERT  CARD at check-in to the Emergency Department and triage nurse.  Should you have questions after your visit or need to cancel or reschedule your appointment, please contact [facility]  Dept: [phone]  and follow the prompts.  Office hours are 8:00 a.m. to 4:30 p.m. Monday - Friday. Please note that voicemails left after 4:00 p.m. may not be returned until the following business day.  We are closed weekends and major holidays. You have access to a nurse at all times for urgent questions. Please call the main number to the clinic Dept: [phone] and follow the prompts.   For any non-urgent questions, you may also contact your provider using MyChart. We now offer e-Visits for anyone 18 and older to request care online for non-urgent symptoms. For details visit [email or web address].   Also download the MyChart app! Go to the app store, search "MyChart", open the app, select [facility], and log in with your MyChart username and password.  Due to Covid, a mask is required upon entering the hospital/clinic. If you do not have a mask, one will be given to you upon arrival. For doctor visits, patients may have 1 support person aged 18 or older with them. For treatment visits, patients cannot have anyone with them due to current Covid guidelines and our immunocompromised population.   

## 2021-12-18 NOTE — Patient Instructions (Signed)
Medication Instructions:  ?Your Physician recommend you continue on your current medication as directed.   ? ?*If you need a refill on your cardiac medications before your next appointment, please call your pharmacy* ? ? ?Lab Work: ?None ordered today ? ? ?Testing/Procedures: ?None ordered today ? ? ?Follow-Up: ?At [facility], you and your health needs are our priority.  As part of our continuing mission to provide you with exceptional heart care, we have created designated Provider Care Teams.  These Care Teams include your primary Cardiologist (physician) and Advanced Practice Providers (APPs -  Physician Assistants and Nurse Practitioners) who all work together to provide you with the care you need, when you need it. ? ?We recommend signing up for the patient portal called "MyChart".  Sign up information is provided on this After Visit Summary.  MyChart is used to connect with patients for Virtual Visits (Telemedicine).  Patients are able to view lab/test results, encounter notes, upcoming appointments, etc.  Non-urgent messages can be sent to your provider as well.   ?To learn more about what you can do with MyChart, go to [email or web address].   ? ?Your next appointment:   ?6 month(s) ? ?The format for your next appointment:   ?In Person ? ?Provider:   ?Janina Mayo, MD { ? ? ?Important Information About [facility] ? ? ? ? ? ? ?

## 2021-12-18 NOTE — Progress Notes (Signed)
?Cardiology Office Note:   ? ?Date:  12/18/2021  ? ?ID:  Kristin Webb, DOB 1946/05/22, MRN [id number] ? ?PCP:  Cari Caraway, MD ?  ?[facility] Providers ?Cardiologist:  None    ? ?Referring MD: Cari Caraway, MD  ? ?No chief complaint on file. ?CAD ? ?History of Present Illness:   ? ?Kristin Webb is a 76 y.o. female with a hx anxiety with PTSD,  ovarian cancer on bevacizumab Day 1, cycle 23 05/23/2021  referred for LAD dx noted on CT, DMII on metformin here for CVD risk assessment. ? ?She notes shortness of breath for awhile since gaining weight. No angina. She has fibromyalgia and has pain all over and pain in the legs. She has OSA and CPAP.  Pleuritic right rib pain with coughing and sneezing. She denies significant lower extremity edema, PND or orthopnea. No palpitations,no syncope. She feels out of balance with standing. No falls. She has no CVD hx. She has no stress test or LHC. She has worked with a Automotive engineer. ? ? ?CVD Risk/Equivalent: ?HLD- on crestor ?HTN- Yes ?PAD- No ?DMII- yes,A1c 6.1 % on metformin ?Smoker-No ?Stroke-No ?Premature Family History- No  ? ? ?Interim Hx 12/18/2021: ?She returns for follow-up. She continues on bevacizumab. She  has no signs of relapse. Her thrombocytopenia is stable, plt >50K.  She was just started on zetia. ? ?CT Scan 04/10/2021 ?Aortic atherosclerosis, in addition to at least left anterior descending coronary artery disease. Assessment for potential risk factor modification, dietary therapy or pharmacologic therapy may be ?warranted, if clinically indicated.  ? ? ?Cardiology Hx/Studies ? ?07/11/2015 ?- Left ventricle: The cavity size was normal. Wall thickness was  ?  increased in a pattern of mild LVH. Systolic function was normal.  ?  The estimated ejection fraction was in the range of 55% to 60%.  ?  Wall motion was normal; there were no regional wall motion  ?  abnormalities.  ?- Mitral valve: Mildly calcified annulus. There was mild  ?  regurgitation.  ?- Left  atrium: The atrium was mildly dilated.  ? ?TTE 06/18/2021 ?Normal LV function and GLS. Normal RV function ?Mild MR ?No evidence of pulmonary htn ? ? ?Cardio Oncology ?Therapy exposure- s/p 6 cycles of paclitaxel and carboplatin 2014 ?10/22/2020- s/p hysterectomy and oophorectomy, no mets ?Bevacizumab 01/29/2020-;no metastasis ? ?Past Medical History:  ?Diagnosis Date  ? Burning mouth syndrome   ? Chronic fatigue   ? Constipation   ? Diabetes ([place]) 05/09/2018  ? Diarrhea   ? in the past after gallbladder removal  ? Fibromyalgia   ? GERD (gastroesophageal reflux disease)   ? History of kidney stones 07/2015  ? Hyperlipidemia   ? Hypertension   ? borderline not on meds   ? Insomnia secondary to depression with anxiety   ? Lumbar disc disease   ? Ovarian cancer ([place]) 2014/2020  ? met nodule in abd  ? Pelvic mass in female   ? Pneumonia   ? hx of pneumonia as an infant  ? PONV (postoperative nausea and vomiting)   ? pain from gas hernia 2017  ? Shortness of breath   ? with exertion   ? Sleep apnea   ? uses CPAP  ? Yeast infection   ? ? ?Past Surgical History:  ?Procedure Laterality Date  ? ABDOMINAL HYSTERECTOMY    ? early 90s  ? APPENDECTOMY    ? WITH DEBULKING/BSO  ? CHOLECYSTECTOMY    ? early 19s  ? CYSTOSCOPY W/ URETERAL STENT  PLACEMENT Left 07/09/2015  ? DUE TO NEPHROLITHIASIS Procedure: CYSTOSCOPY WITH LEFT RETROGRADE PYELOGRAM/ LEFT URETERAL STENT PLACEMENT;  Surgeon: Ardis Hughs, MD;  Location: [facility];  Service: Urology;  Laterality: Left;  ? HERNIA REPAIR    ? INCISIONAL HERNIA REPAIR N/A 12/25/2015  ? WITH MESH Procedure:  INCISIONAL HERNIA REPAIR;  Surgeon: Ralene Ok, MD;  Location: [facility];  Service: General;  Laterality: N/A;  ? [place] N/A 10/21/2018  ? Procedure: LAPAROSCOPIC INCISIONAL HERNIA REPAIR WITH MESH;  Surgeon: Ralene Ok, MD;  Location: [facility];  Service: General;  Laterality: N/A;  ? INSERTION OF MESH N/A 12/25/2015  ? Procedure: INSERTION OF MESH;  Surgeon:  Ralene Ok, MD;  Location: [facility];  Service: General;  Laterality: N/A;  ? LAPAROTOMY Bilateral 11/08/2012  ? Procedure: EXPLORATORY LAPAROTOMY BILATERAL SALPINGO OOPHORECTOMY TUMOR DEBULKING ;  Surgeon: Imagene Gurney A. Alycia Rossetti, MD;  Location: [facility];  Service: Gynecology;  Laterality: Bilateral;  APPENDECTOMY / OMENTECTOMY  ? LAPAROTOMY N/A 12/25/2015  ? Procedure: EXPLORATORY LAPAROTOMY;  Surgeon: Ralene Ok, MD;  Location: [facility];  Service: General;  Laterality: N/A;  ? LYSIS OF ADHESION N/A 12/25/2015  ? Procedure: LYSIS OF ADHESION;  Surgeon: Ralene Ok, MD;  Location: [facility];  Service: General;  Laterality: N/A;  ? PARTIAL HYSTERECTOMY  2004-11-06  ? TRIGGER FINGER RELEASE    ? ? ?Current Medications: ?No outpatient medications have been marked as taking for the 12/18/21 encounter (Appointment) with Janina Mayo, MD.  ?  ? ?Allergies:   Ambien [zolpidem tartrate], Bee venom, and Cortisone  ? ?Social History  ? ?Socioeconomic History  ? Marital status: Widowed  ?  Spouse name: [name]  ? Number of children: 2  ? Years of education: Master's  ? Highest education level: Not on file  ?Occupational History  ? Not on file  ?Tobacco Use  ? Smoking status: Never  ? Smokeless tobacco: Never  ?Vaping Use  ? Vaping Use: Never used  ?Substance and Sexual Activity  ? Alcohol use: No  ? Drug use: No  ? Sexual activity: Not on file  ?Other Topics Concern  ? Not on file  ?Social History Narrative  ? Patient is married [name]). Husband passed away in 11/07/14  ? Patient has 2 children by birth and 13 children all together.  ? Patient has a Oceanographer.  ? Patient is right-handed.  ? Patient drinks very little caffeine.  ?   ?   ? ?Social Determinants of Health  ? ?Financial Resource Strain: Not on file  ?Food Insecurity: Not on file  ?Transportation Needs: Not on file  ?Physical Activity: Not on file  ?Stress: Not on file  ?Social Connections: Not on file  ?  ? ?Family History: ?The patient's family history includes High Cholesterol  in her mother; High blood pressure in her mother; Kidney disease in an other family member; Lung cancer (age of onset: 42) in her father; Lung cancer (age of onset: 74) in her mother; Parkinson's disease in her father. ? ?ROS:   ?Please see the history of present illness.    ? All other systems reviewed and are negative. ? ?EKGs/Labs/Other Studies Reviewed:   ? ?The following studies were reviewed today: ? ? ?EKG:  EKG is  ordered today.  The ekg ordered today demonstrates  ? ?NSR, non specific ST-T changes ? ?NSR, poor r wave progression ? ?Recent Labs: ?11/28/2021: ALT 24; BUN 18; Creatinine, Ser 0.77; Potassium 4.1; Sodium 140 ?12/18/2021: Hemoglobin 13.5; Platelets  123  ?Recent Lipid Panel ?No results found for: CHOL, TRIG, HDL, CHOLHDL, VLDL, LDLCALC, LDLDIRECT ? ? ?Risk Assessment/Calculations:   ?  ?ASCVD 39% High ?LDL 86 ?TC 171 ? ? ?Physical Exam:   ? ?VS:  ?Vitals:  ? 12/18/21 1423  ?BP: 124/76  ?Pulse: 75  ?SpO2: 98%  ? ? ? ?Wt Readings from Last 3 Encounters:  ?12/18/21 195 lb 12 oz (88.8 kg)  ?11/28/21 198 lb 3.2 oz (89.9 kg)  ?11/07/21 185 lb 12 oz (84.3 kg)  ?  ? ?GEN:  Well nourished, well developed in no acute distress.  ?HEENT: Normal ?NECK: No JVD; No carotid bruits ?CARDIAC: RRR, no murmurs, rubs, gallops ?RESPIRATORY:  Clear to auscultation without rales, wheezing or rhonchi  ?ABDOMEN: Soft, non-tender, non-distended ?MUSCULOSKELETAL:  No edema; No deformity  ?SKIN: Warm and dry ?NEUROLOGIC:  Alert and oriented x 3 ?PSYCHIATRIC:  Normal affect  ? ?ASSESSMENT:   ? ?#LAD CAC: see some distal disease noted, she is asymptomatic. Her echocardiogram was normal. LDL 86, the goal is < 70 mg/dL. Continue zetia, she just started.  Will continue to observe for symptoms of worsening DOE or chest pressure and can consider LHC if this were to arise. We discussed this. ? ?#HTN: well controlled SBP 130s ambulatory.Continue current regimen ? ?#Cardio-Oncology- For bevacizumab.One meta analysis noted risk for  cardiac and cerebral ischemia as well as hypertension in patients especially with high dosing;Totzeck et al. Ronalee Belts 2017 ? ?PLAN:   ? ?In order of problems listed above: ? ?Lipid profile in a few months ; LDL goal < 70

## 2021-12-19 DIAGNOSIS — E782 Mixed hyperlipidemia: Secondary | ICD-10-CM | POA: Diagnosis not present

## 2021-12-19 DIAGNOSIS — E1159 Type 2 diabetes mellitus with other circulatory complications: Secondary | ICD-10-CM | POA: Diagnosis not present

## 2021-12-19 DIAGNOSIS — I2510 Atherosclerotic heart disease of native coronary artery without angina pectoris: Secondary | ICD-10-CM | POA: Diagnosis not present

## 2021-12-22 ENCOUNTER — Telehealth: Payer: Self-pay | Admitting: Family Medicine

## 2021-12-22 NOTE — Telephone Encounter (Signed)
Pt has asked that it be noted that a Pharmacist at [facility] told her it would be ok for her to take eszopiclone (LUNESTA) 2 MG TABS tablet  and colestipol (COLESTID) 1 g tablet together.  Pt just wanted this noted should Dr Brett Fairy have any questions or concerns on this. ?

## 2021-12-22 NOTE — Telephone Encounter (Signed)
Noted, pt has f/u with Dr. Brett Fairy tomorrow (12/23/21) at 2:30pm.  ?

## 2021-12-23 ENCOUNTER — Encounter: Payer: Self-pay | Admitting: Neurology

## 2021-12-23 ENCOUNTER — Ambulatory Visit (INDEPENDENT_AMBULATORY_CARE_PROVIDER_SITE_OTHER): Payer: Medicare HMO | Admitting: Neurology

## 2021-12-23 VITALS — BP 122/82 | HR 74 | Ht 59.5 in | Wt 197.5 lb

## 2021-12-23 DIAGNOSIS — D701 Agranulocytosis secondary to cancer chemotherapy: Secondary | ICD-10-CM | POA: Diagnosis not present

## 2021-12-23 DIAGNOSIS — R530 Neoplastic (malignant) related fatigue: Secondary | ICD-10-CM | POA: Diagnosis not present

## 2021-12-23 DIAGNOSIS — C786 Secondary malignant neoplasm of retroperitoneum and peritoneum: Secondary | ICD-10-CM

## 2021-12-23 DIAGNOSIS — E119 Type 2 diabetes mellitus without complications: Secondary | ICD-10-CM

## 2021-12-23 DIAGNOSIS — Z9989 Dependence on other enabling machines and devices: Secondary | ICD-10-CM | POA: Diagnosis not present

## 2021-12-23 DIAGNOSIS — G4733 Obstructive sleep apnea (adult) (pediatric): Secondary | ICD-10-CM | POA: Diagnosis not present

## 2021-12-23 DIAGNOSIS — F5102 Adjustment insomnia: Secondary | ICD-10-CM

## 2021-12-23 DIAGNOSIS — G620 Drug-induced polyneuropathy: Secondary | ICD-10-CM

## 2021-12-23 DIAGNOSIS — T451X5A Adverse effect of antineoplastic and immunosuppressive drugs, initial encounter: Secondary | ICD-10-CM | POA: Insufficient documentation

## 2021-12-23 MED ORDER — MODAFINIL 200 MG PO TABS: 200.0000 mg | ORAL_TABLET | Freq: Every morning | ORAL | 5 refills | Status: DC

## 2021-12-23 MED ORDER — ESZOPICLONE 2 MG PO TABS: 2.0000 mg | ORAL_TABLET | Freq: Every day | ORAL | 0 refills | Status: DC

## 2021-12-23 MED ORDER — TRAZODONE HCL 50 MG PO TABS: 50.0000 mg | ORAL_TABLET | Freq: Every day | ORAL | 0 refills | Status: DC

## 2021-12-23 MED ORDER — ESZOPICLONE 2 MG PO TABS: 2.0000 mg | ORAL_TABLET | Freq: Every day | ORAL | 5 refills | Status: DC

## 2021-12-23 NOTE — Progress Notes (Addendum)
[facility] ? ?[place]  ?  ?Provider:  Asencion Partridge  Yvonda Fouty,M.D.  ?  ?Referring Provider: Cari Caraway, Kristin Webb at [facility].  ?  ?Primary Care Physician: Nancy Marus, Kristin Webb [facility]. Standley Dakins, Kristin Webb at [facility].  -  Review sleepiness, CPAP compliance.  interface now covers mouth and nose, in spite of easily irritated skin. Machine was set up May 2018.  ? ?Last seen 09/24/20. Here for yearly CPAP f/u. Pt reports being up a couple times at night. Taking 2 benadryl at night w her Kristin Webb, helps reduce it down to 1x.   ?  ?HPI: I have the pleasure of meeting today again with Kristin Webb meanwhile 76 year old Caucasian widowed female patient whom I have followed for obstructive sleep apnea on CPAP and through stage IV cancer diagnosis with relapse.   ?She states that she is extremely fatigued which is more likely attributed to her neoplasm.  Sleepiness and the true sense of the word is not her problem or not as much.  She had some limitations in her physical exercise capabilities.  She is highly compliant with CPAP uses the machine over the last 30 days is 100% with an average time of 8 hours and 34 minutes, overall not but she estimates her sleep time to be less than 6 hours usually.  Her sleep is broken up and she has over-the-counter Benadryl taken to improvement.  Benadryl however will leave her with a dry mouth dry eyes and more fatigued so we have to find an alternative.  Common setting right now is CPAP at 16 cmH2O with an AHI of 2.0 and her 95th percentile air leak is 22.6 L a minute which is still fine as she has no central apneas emerging.  What I need to look for is how the old her machine currently is 75 years old and will be replaced after a new baseline sleep test she owns a travel machine , too. ?Nuvigil works for fatigue in her - I will refill. No headaches not jittery.  ? ? ? ? ?Kristin Webb is a 76 y.o. female patient with a history of OSA on CPAP -and  persistent fatigue, is seen here in a revisit for CPAP follow up/ compliance visit.  ?She is a Glass blower/designer, has a large social circle. She is living alone with her rescue dog, and is optimistic and happy.  ?Rv 05-23-2019-I have the pleasure of looking at the data download for Kristin Webb encompassing 30 days before 21 May 2019.  She has been 100% compliant with an average usage time of 8 hours 90 minutes, minimum pressure is 5 maximum pressure is applied is 15 cmH2O with 1 cm expiratory pressure relief.  Residual AHI is 1.4 which is an excellent resolution she has minimal air leaks her 95th percentile pressure is 14.9cm  this may be related to some weight gain. She has used melatonin, benadryl.  ?  ?  ?  ?Kristin Webb is seen here on 09 May 2018, she has been a highly compliant CPAP user, her downloaded data however have to be combined from 2 different machines.  For general use at home she has an air sense 10 AutoSet machine and then she has a travel CPAP as well.  The average user time on days used is 8 hours and 59 minutes, she has used CPAP in one form or another for 97% of the recorded time, she is using AutoSet 5-15 cmH2O was 1 cm EPR level  the 95th percentile pressure required is 14.5 which straddles the upper limit.  Residual AHI is 0.8 which is excellent and there are no major air leaks.  She has gained weight since I saw her last and her BMI rose by over 4 points this may explain why she needs a higher setting at this time.  Her Epworth Sleepiness Scale was endorsed at 13 out of 24 possible points and her fatigue severity at 44 out of 63 possible points, the geriatric depression scale was endorsed at 1 out of 15 points. ?  ?  ?  ?Interval history from 05/05/2017, Kristin Webb is doing well, she is living by herself with her dog, recently had some home restoration to do. This added a little bit of hectic to her life, however she is doing well and her counselor has not progressed.  She is followed by a new oncologist, [name]. ?She underwent another HST following significant weight gain, AHI was 9.1 /hr. She has many desaturations and was placed on auto CPAP in response to high EDS with mild apnea.  ?Her compliance to CPAP with excellent at 90% with an average use of 8 hours and 32 minutes, she is using an AutoSet between 5 and 15 cm water was 1 cm EPR and her residual AHI is 1.9. This is excellent resolution of her apnea. The 95th percentile pressure is 14.8, there were no central apneas emerging, there is no need to adjust the machine in any way. She endorsed the Epworth Sleepiness Scale at 9 points, fatigue severity at 44 points and the geriatric depression score is 1 out of 15 points. ?She continues to participate in a grief group after her husband's death. She started Yoga, Zen knitting and fosters dogs.  ? 01-01-2017 DOS: 12/28/16  ?REFERRING PHYSICIAN: Cari Caraway ,Kristin Webb  ?Study performed: HST  ?HISTORY: Kristin Webb is a female patient, with history of OSA  ?on CPAP, cancer and weight gain -and persistent fatigue,  ?hypersomnia. She is seen here for CPAP follow up- She has been  ?using Nuvigil as tolerated which has given her improvement in  ?life quality and energy.  ?The last CPAP download was on 05-06-12, with the 95% percentile  ?pressure of 12 cm water, the residual AHI of 0.5/hr. and an  ?average of 5.9 hours of night-user time for CPAP.  She has  ?endorsed the Epworth score at 10 points, fatigue severity 45  ?points, geriatric depression score 2 out of 15. BMI was 34.5.  ? ?STUDY RESULTS: Total Recording Time: 8h 45 m with the Total  ?Apnea/Hypopnea Index (AHI): 9.1/hr. and RDI (Snoring index was  ?12.1/hr., desaturation index was 11.9 /hr.  Average Oxygen  ?Saturation: SpO2 93%, Nadir SpO2 (Lowest Oxygen Saturation): 85%,  ?without clinically significant time in hypoxemia (7 minutes at or  ?under 88% ). Average Heart Rate: 71 bpm  ? IMPRESSION:  ?HST does not reveal evidence of  other than mild obstructive sleep  ?apnea, neither hypoxemia or tachy- brady  arrhythmia.  I cannot  ?see a correlation of sleep physiology and excessive fatigue.  ?I certify that I have reviewed the raw data recording prior to  ?the issuance of this report in accordance with the standards of  ?Accreditation of the [employer] of Sleep medicine (AASM)  ?Kristin Seat, Kristin Webb    01-04-2017        ?Diplomat, Tax adviser of Psychiatry and Neurology and  ?Diplomat, Tax adviser of Sleep Medicine, Market researcher of  ?[facility] at  [facility]  ?  ?12-12-13,Since I have seen her last this patient was diagnosed with ovarian cancer, underwent surgery and chemotherapy. She currently struggles with a weak  abdominal and core musculatur  and it feels to her she had an abdominal hernia. She has tried a corsett , but this has aggravated her bulging discs in her spine.  ?The patient was originally referred by Dr. Sheryn Bison at the time with fatigue , pain, excessive sleepiness and burning mouth syndrome. In 2010 she developed transiently left mouth droop, and a TIA/ CVA work up was negative. EMG and nerve conduction studies were unrevealing , a rheumatology consult,  pain specialist , neurosurgical evaluation were unrevealing .  ?Labs revealed a vitamin D deficiency,  normal TSH and CMET, CBC.  Her neurosurgery consult for back pain was without results. ?  ?She was referred to me for a sleep consultation in 2009. The patient was diagnosed with sleep apnea on 03-07-08 with an AHI of 24.3 and in REM AHI of 82. Her BMI at the time was 37.9 , she was titrated to 10 cm water pressure on CPAP  which relieved her AHI but she still snored.  ?Mrs. Windom's newest  sleep study dated 11-26-13 whowed  residual apnea. The study was therefore a normal polysomnography and not split into a titration parts. The AHI was 0.9, the RDI was 2.4 and the oxygen nadir was 87% time and his saturation was 33.6 minutes at or below 90% saturation of oxygen.  Heart which was regular, normal sinus rhythm prevailed. She still sleeps better with Lunesta 2 mg could not get a good result from 1 mg pills. ?Fragmentation of sleep, alpha intrusion can be seen in chronic

## 2021-12-23 NOTE — Patient Instructions (Signed)
Fatigue ?If you have fatigue, you feel tired all the time and have a lack of energy or a lack of motivation. Fatigue may make it difficult to start or complete tasks because of exhaustion. ?Occasional or mild fatigue is often a normal response to activity or life. However, long-term (chronic) or extreme fatigue may be a symptom of a medical condition such as: ?Depression. ?Not having enough red blood cells or hemoglobin in the blood (anemia). ?A problem with a small gland located in the lower front part of the neck (thyroid disorder). ?Rheumatologic conditions. These are problems related to the body's defense system (immune system). ?Infections, especially certain viral infections. ?Fatigue can also lead to negative health outcomes over time. ?Follow these instructions at home: ?Medicines ?Take over-the-counter and prescription medicines only as told by your health care provider. ?Take a multivitamin if told by your health care provider. ?Do not use herbal or dietary supplements unless they are approved by your health care provider. ?Eating and drinking ? ?Avoid heavy meals in the evening. ?Eat a well-balanced diet, which includes lean proteins, whole grains, plenty of fruits and vegetables, and low-fat dairy products. ?Avoid eating or drinking too many products with caffeine in them. ?Avoid alcohol. ?Drink enough fluid to keep your urine pale yellow. ?Activity ? ?Exercise regularly, as told by your health care provider. ?Use or practice techniques to help you relax, such as yoga, tai chi, meditation, or massage therapy. ?Lifestyle ?Change situations that cause you stress. Try to keep your work and personal schedules in balance. ?Do not use recreational or illegal drugs. ?General instructions ?Monitor your fatigue for any changes. ?Go to bed and get up at the same time every day. ?Avoid fatigue by pacing yourself during the day and getting enough sleep at night. ?Maintain a healthy weight. ?Contact a health care  provider if: ?Your fatigue does not get better. ?You have a fever. ?You suddenly lose or gain weight. ?You have headaches. ?You have trouble falling asleep or sleeping through the night. ?You feel angry, guilty, anxious, or sad. ?You have swelling in your legs or another part of your body. ?Get help right away if: ?You feel confused, feel like you might faint, or faint. ?Your vision is blurry or you have a severe headache. ?You have severe pain in your abdomen, your back, or the area between your waist and hips (pelvis). ?You have chest pain, shortness of breath, or an irregular or fast heartbeat. ?You are unable to urinate, or you urinate less than normal. ?You have abnormal bleeding from the rectum, nose, lungs, nipples, or, if you are female, the vagina. ?You vomit blood. ?You have thoughts about hurting yourself or others. ?These symptoms may be an emergency. Get help right away. Call 911. ?Do not wait to see if the symptoms will go away. ?Do not drive yourself to the hospital. ?Get help right away if you feel like you may hurt yourself or others, or have thoughts about taking your own life. Go to your nearest emergency room or: ?Call 911. ?Call the National Suicide Prevention Lifeline at 1-800-273-8255 or 988. This is open 24 hours a day. ?Text the Crisis Text Line at 741741. ?Summary ?If you have fatigue, you feel tired all the time and have a lack of energy or a lack of motivation. ?Fatigue may make it difficult to start or complete tasks because of exhaustion. ?Long-term (chronic) or extreme fatigue may be a symptom of a medical condition. ?Exercise regularly, as told by your health care provider. ?  Change situations that cause you stress. Try to keep your work and personal schedules in balance. ?This information is not intended to replace advice given to you by your health care provider. Make sure you discuss any questions you have with your health care provider. ?Document Revised: 05/19/2021 Document  Reviewed: 05/19/2021 ?Elsevier Patient Education ? 2023 [employer] ? ?

## 2021-12-24 IMAGING — MG DIGITAL SCREENING BILAT W/ TOMO W/ CAD
8 series · 8 of 24 positions shown · non-contrast
Comparison: Previous exam(s).

CLINICAL DATA: Screening.

EXAM:
DIGITAL SCREENING BILATERAL MAMMOGRAM WITH TOMO AND CAD

[L CC synth-2D]
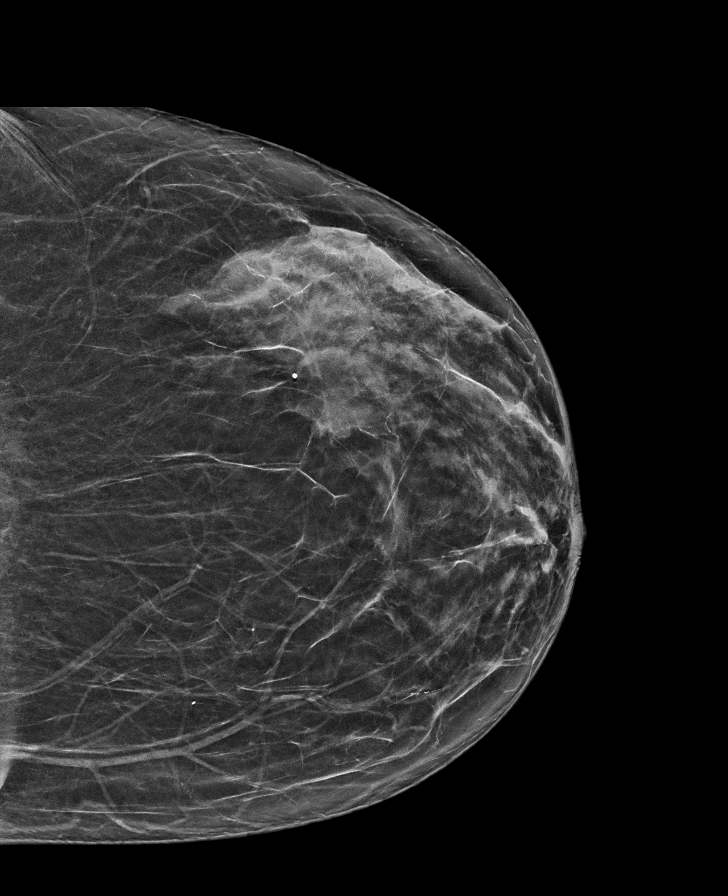

[R MLO synth-2D]
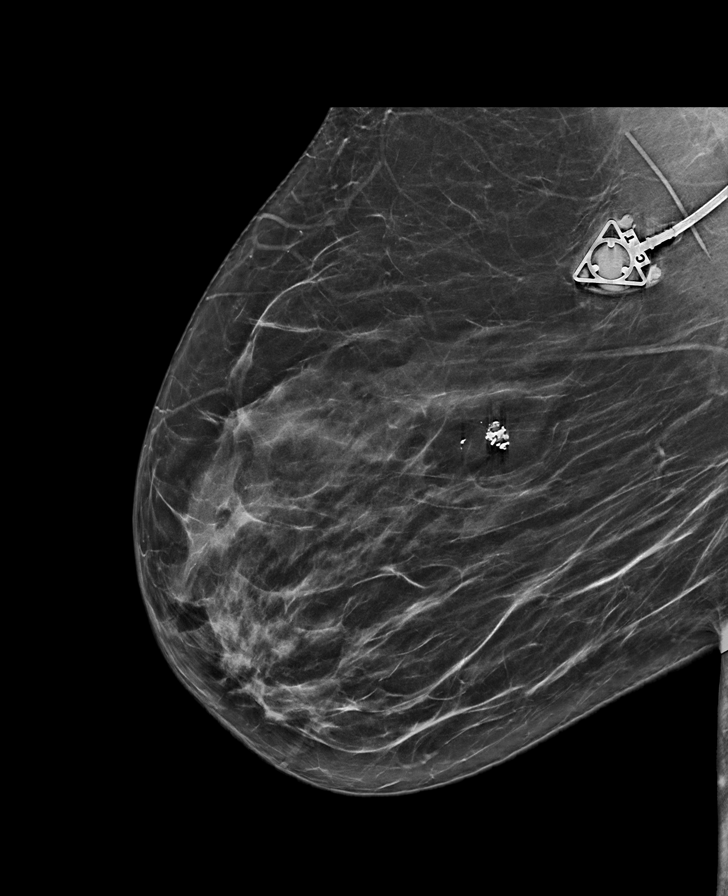

[R CC synth-2D]
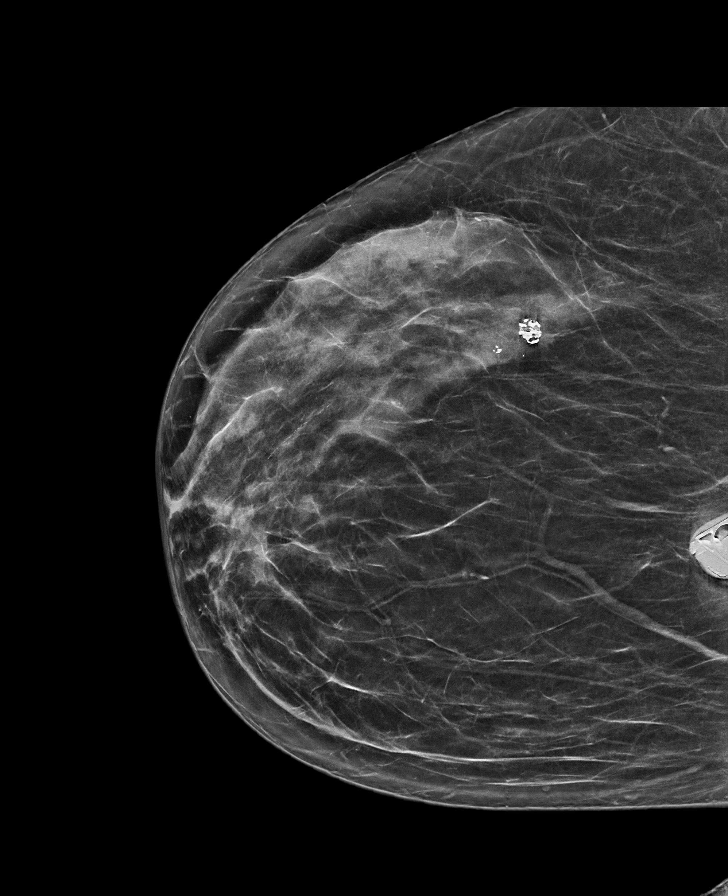

[L MLO synth-2D]
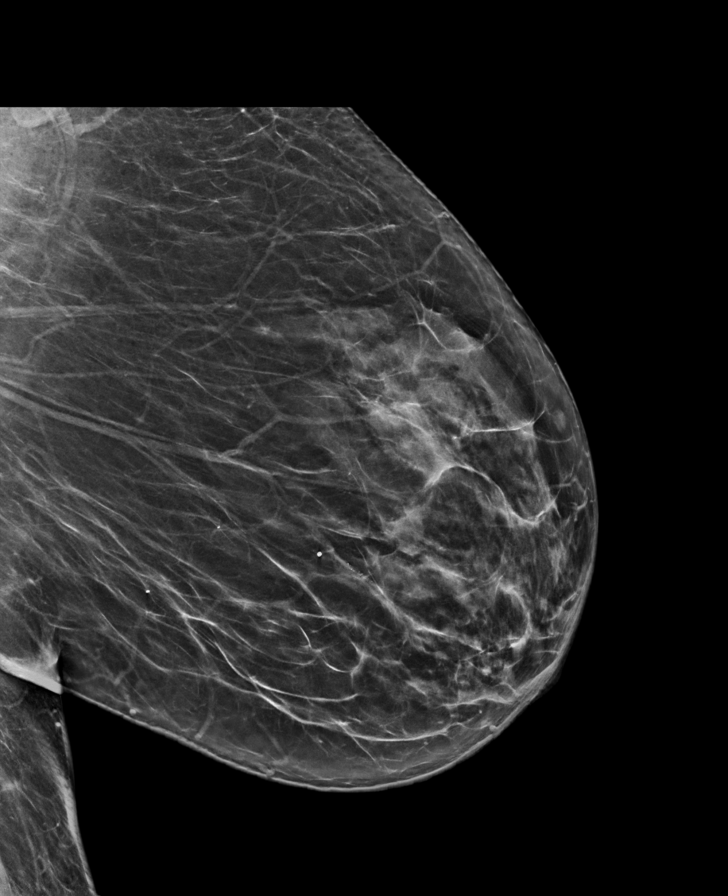

[R MLO tomo · tomo slice 36/71.0]
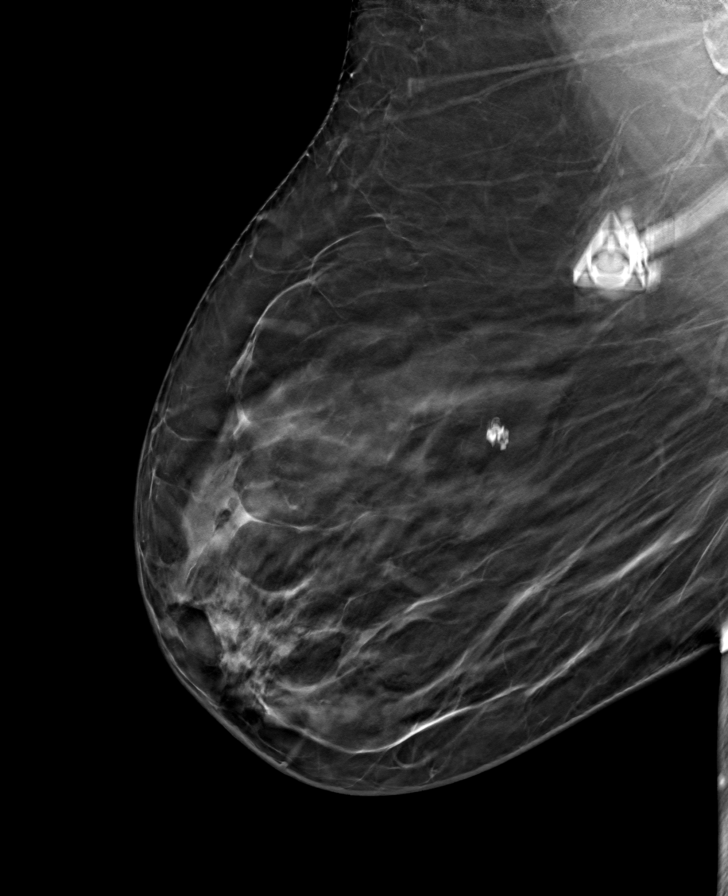

[L MLO tomo · tomo slice 36/71.0]
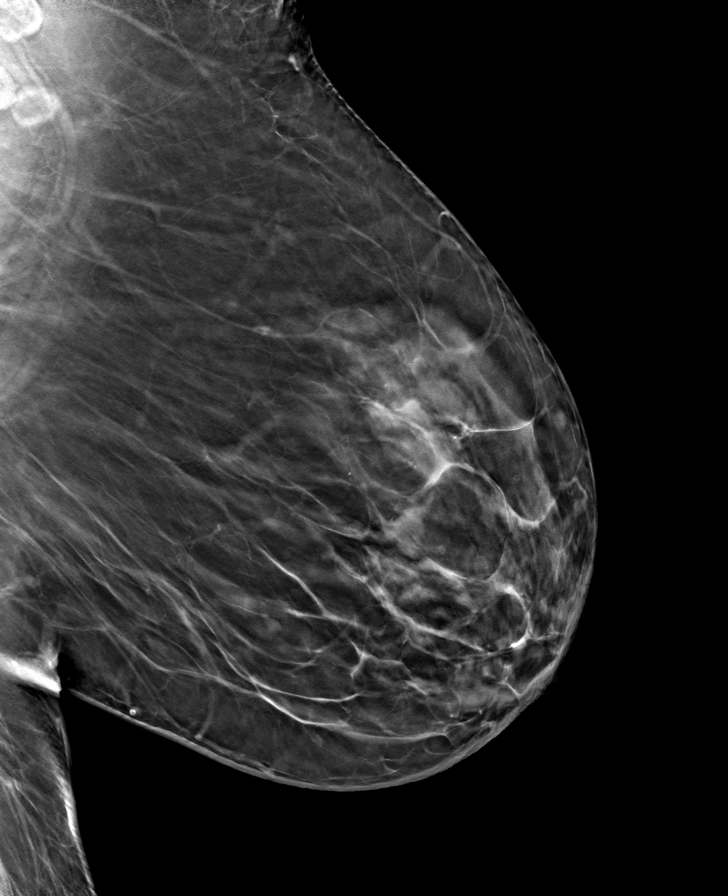

[R CC tomo · tomo slice 31/62.0]
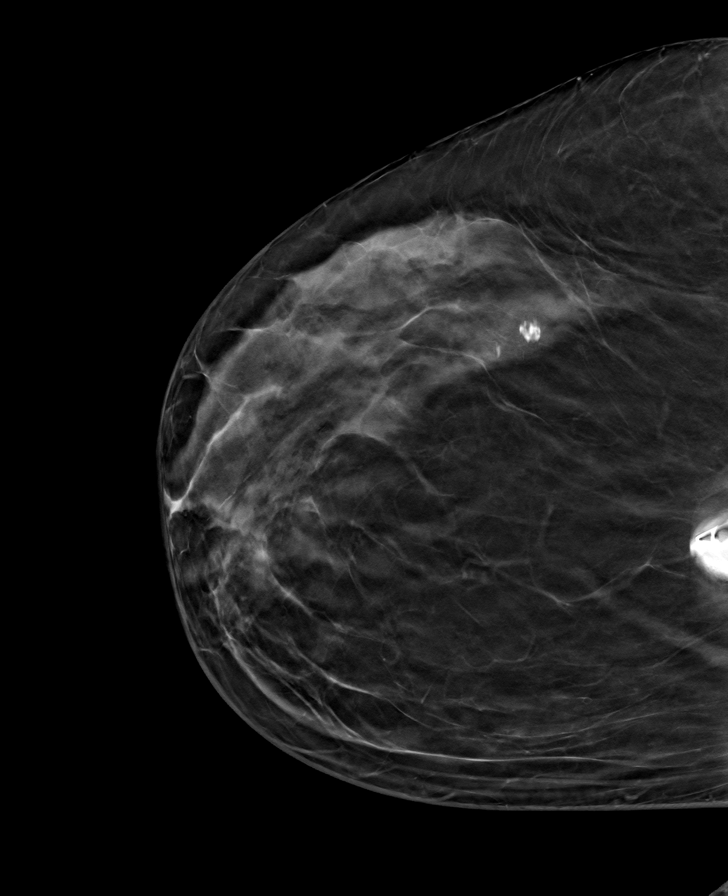

[L CC tomo · tomo slice 32/63.0]
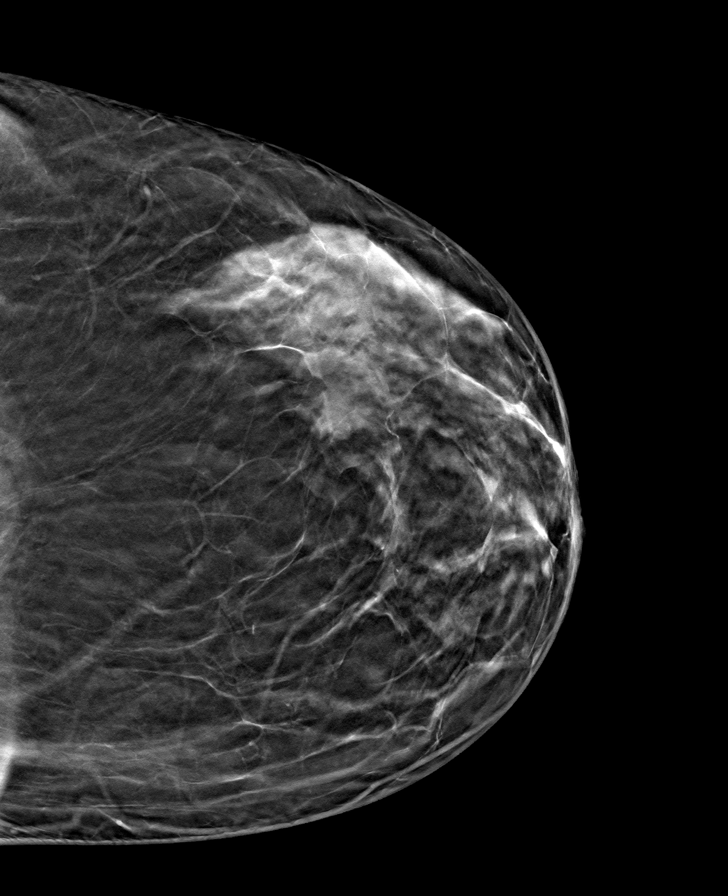

[8 of 24 positions shown; findings below may reference images not displayed]

ACR Breast Density Category c: The breast tissue is heterogeneously
dense, which may obscure small masses.
FINDINGS: In the left breast, a possible mass warrants further evaluation. In
the right breast, no findings suspicious for malignancy.

Images were processed with CAD.
IMPRESSION: Further evaluation is suggested for possible mass in the left
breast.

RECOMMENDATION:
Diagnostic mammogram and possibly ultrasound of the left breast.
(Code:[id number])

The patient will be contacted regarding the findings, and additional
imaging will be scheduled.

BI-RADS CATEGORY  0: Incomplete. Need additional imaging evaluation
and/or prior mammograms for comparison.

## 2021-12-25 DIAGNOSIS — Z6839 Body mass index (BMI) 39.0-39.9, adult: Secondary | ICD-10-CM | POA: Diagnosis not present

## 2021-12-25 DIAGNOSIS — M5412 Radiculopathy, cervical region: Secondary | ICD-10-CM | POA: Diagnosis not present

## 2021-12-25 DIAGNOSIS — M25511 Pain in right shoulder: Secondary | ICD-10-CM | POA: Diagnosis not present

## 2021-12-25 DIAGNOSIS — M5414 Radiculopathy, thoracic region: Secondary | ICD-10-CM | POA: Diagnosis not present

## 2021-12-25 DIAGNOSIS — M5416 Radiculopathy, lumbar region: Secondary | ICD-10-CM | POA: Diagnosis not present

## 2021-12-29 ENCOUNTER — Other Ambulatory Visit: Payer: Self-pay | Admitting: Neurosurgery

## 2021-12-29 ENCOUNTER — Other Ambulatory Visit (HOSPITAL_COMMUNITY): Payer: Self-pay | Admitting: Neurosurgery

## 2021-12-29 DIAGNOSIS — M5416 Radiculopathy, lumbar region: Secondary | ICD-10-CM

## 2021-12-29 DIAGNOSIS — M25511 Pain in right shoulder: Secondary | ICD-10-CM

## 2021-12-29 DIAGNOSIS — M5414 Radiculopathy, thoracic region: Secondary | ICD-10-CM

## 2021-12-29 DIAGNOSIS — M5412 Radiculopathy, cervical region: Secondary | ICD-10-CM

## 2022-01-01 ENCOUNTER — Telehealth: Payer: Self-pay | Admitting: Neurology

## 2022-01-01 NOTE — Telephone Encounter (Signed)
Called the patient and there was no answer. Left a detailed message advising the pt she can either send [place] a mychart message advising [place] of what medications need to be corrected or call back.   **If patient returns call, and we are unable to take the call please have the patient leave a more detailed message of her concern and advise of what medications we should change and we can make the correction.

## 2022-01-01 NOTE — Telephone Encounter (Signed)
Pt states there is some confusion and possible duplications on some of  her medications, please call

## 2022-01-02 DIAGNOSIS — I10 Essential (primary) hypertension: Secondary | ICD-10-CM | POA: Diagnosis not present

## 2022-01-02 DIAGNOSIS — E1159 Type 2 diabetes mellitus with other circulatory complications: Secondary | ICD-10-CM | POA: Diagnosis not present

## 2022-01-02 DIAGNOSIS — E782 Mixed hyperlipidemia: Secondary | ICD-10-CM | POA: Diagnosis not present

## 2022-01-06 ENCOUNTER — Telehealth: Payer: Self-pay | Admitting: Neurology

## 2022-01-06 ENCOUNTER — Other Ambulatory Visit: Payer: Self-pay | Admitting: Neurology

## 2022-01-06 NOTE — Telephone Encounter (Signed)
Pt said, stop taking traZODone (DESYREL) 50 MG tablet on 01/02/22. On 12/30/21 started taking the medication, that night had no sleep, next morning had constipation. Took last dose on the 01/01/22. Would like a call from the nurse.

## 2022-01-06 NOTE — Telephone Encounter (Signed)
LVM returning patient call.

## 2022-01-06 NOTE — Telephone Encounter (Signed)
Pt called and LVM returning RN's call. Please call back when available.

## 2022-01-06 NOTE — Telephone Encounter (Signed)
Pt returned the call. I was able to talk with her. Pt states that when she started the trazodone she had constipation that began to occur (which is out of the normal for her) she also states that she wasn't sleeping  with the added med.  Pt called her pcp over the weekend due to [place] being closed and they told her stop the medication and call us. Pt stopped the med as of Friday and her BM's are getting better. She is starting to have more often and not pain associated with it. However prior to adding the trazodone with the lunesta and melatonin she was at least getting fragmented sleep. She is not sleeping at all. She states last night she wasn't able to fall asleep at all.  I informed the patient that Dr Brett Fairy is out of the office and not expected to return until June 13. Informed the patient that it could just be the trazodone working out of her system. The patient is aware that Dr Brett Fairy is out of the office and will await her return unless it gets to a point she is unable to function then she will call back.  She will send a message through [place] as we get closer to Dr Dohmeier's return updating [place] on how she is doing and whether we need to discuss alternative options.

## 2022-01-06 NOTE — Telephone Encounter (Signed)
LVM asking for pt to call office, if she calls, please see if we can pick up call first before hanging up with her. Thank you

## 2022-01-09 ENCOUNTER — Other Ambulatory Visit: Payer: Self-pay

## 2022-01-09 ENCOUNTER — Inpatient Hospital Stay: Payer: Medicare HMO | Attending: Gynecologic Oncology | Admitting: Hematology and Oncology

## 2022-01-09 ENCOUNTER — Encounter: Payer: Self-pay | Admitting: Hematology and Oncology

## 2022-01-09 ENCOUNTER — Inpatient Hospital Stay: Payer: Medicare HMO

## 2022-01-09 VITALS — BP 118/80 | HR 63 | Temp 97.6°F | Resp 18 | Ht 59.5 in | Wt 197.5 lb

## 2022-01-09 DIAGNOSIS — D696 Thrombocytopenia, unspecified: Secondary | ICD-10-CM | POA: Diagnosis not present

## 2022-01-09 DIAGNOSIS — M549 Dorsalgia, unspecified: Secondary | ICD-10-CM | POA: Diagnosis not present

## 2022-01-09 DIAGNOSIS — M5412 Radiculopathy, cervical region: Secondary | ICD-10-CM

## 2022-01-09 DIAGNOSIS — C5701 Malignant neoplasm of right fallopian tube: Secondary | ICD-10-CM | POA: Diagnosis not present

## 2022-01-09 DIAGNOSIS — C561 Malignant neoplasm of right ovary: Secondary | ICD-10-CM | POA: Insufficient documentation

## 2022-01-09 DIAGNOSIS — C5700 Malignant neoplasm of unspecified fallopian tube: Secondary | ICD-10-CM

## 2022-01-09 DIAGNOSIS — R809 Proteinuria, unspecified: Secondary | ICD-10-CM | POA: Insufficient documentation

## 2022-01-09 DIAGNOSIS — R300 Dysuria: Secondary | ICD-10-CM | POA: Diagnosis not present

## 2022-01-09 DIAGNOSIS — M797 Fibromyalgia: Secondary | ICD-10-CM | POA: Insufficient documentation

## 2022-01-09 DIAGNOSIS — G8929 Other chronic pain: Secondary | ICD-10-CM | POA: Insufficient documentation

## 2022-01-09 DIAGNOSIS — M5414 Radiculopathy, thoracic region: Secondary | ICD-10-CM

## 2022-01-09 DIAGNOSIS — M5416 Radiculopathy, lumbar region: Secondary | ICD-10-CM

## 2022-01-09 DIAGNOSIS — C785 Secondary malignant neoplasm of large intestine and rectum: Secondary | ICD-10-CM | POA: Diagnosis not present

## 2022-01-09 DIAGNOSIS — I10 Essential (primary) hypertension: Secondary | ICD-10-CM | POA: Diagnosis not present

## 2022-01-09 DIAGNOSIS — Z5111 Encounter for antineoplastic chemotherapy: Secondary | ICD-10-CM | POA: Insufficient documentation

## 2022-01-09 LAB — URINALYSIS, COMPLETE (UACMP) WITH MICROSCOPIC
Bilirubin Urine: NEGATIVE
Glucose, UA: NEGATIVE mg/dL
Hgb urine dipstick: NEGATIVE
Ketones, ur: NEGATIVE mg/dL
Nitrite: NEGATIVE
Protein, ur: 100 mg/dL — AB
Specific Gravity, Urine: 1.02 (ref 1.005–1.030)
pH: 6 (ref 5.0–8.0)

## 2022-01-09 LAB — COMPREHENSIVE METABOLIC PANEL
ALT: 30 U/L (ref 0–44)
AST: 26 U/L (ref 15–41)
Albumin: 4.1 g/dL (ref 3.5–5.0)
Alkaline Phosphatase: 67 U/L (ref 38–126)
Anion gap: 7 (ref 5–15)
BUN: 20 mg/dL (ref 8–23)
CO2: 27 mmol/L (ref 22–32)
Calcium: 9.1 mg/dL (ref 8.9–10.3)
Chloride: 106 mmol/L (ref 98–111)
Creatinine, Ser: 0.8 mg/dL (ref 0.44–1.00)
GFR, Estimated: >60 mL/min (ref >60)
Glucose, Bld: 115 mg/dL — ABNORMAL HIGH (ref 70–99)
Potassium: 4.4 mmol/L (ref 3.5–5.1)
Sodium: 140 mmol/L (ref 135–145)
Total Bilirubin: 0.5 mg/dL (ref 0.3–1.2)
Total Protein: 6.4 g/dL — ABNORMAL LOW (ref 6.5–8.1)

## 2022-01-09 LAB — TOTAL PROTEIN, URINE DIPSTICK: Protein, ur: 100 mg/dL — AB

## 2022-01-09 LAB — CBC WITH DIFFERENTIAL/PLATELET
Abs Immature Granulocytes: 0.01 10*3/uL (ref 0.00–0.07)
Basophils Absolute: 0 10*3/uL (ref 0.0–0.1)
Basophils Relative: 1 %
Eosinophils Absolute: 0.1 10*3/uL (ref 0.0–0.5)
Eosinophils Relative: 3 %
HCT: 41.8 % (ref 36.0–46.0)
Hemoglobin: 13.9 g/dL (ref 12.0–15.0)
Immature Granulocytes: 0 %
Lymphocytes Relative: 32 %
Lymphs Abs: 1.6 10*3/uL (ref 0.7–4.0)
MCH: 29.8 pg (ref 26.0–34.0)
MCHC: 33.3 g/dL (ref 30.0–36.0)
MCV: 89.5 fL (ref 80.0–100.0)
Monocytes Absolute: 0.6 10*3/uL (ref 0.1–1.0)
Monocytes Relative: 12 %
Neutro Abs: 2.6 10*3/uL (ref 1.7–7.7)
Neutrophils Relative %: 52 %
Platelets: 127 10*3/uL — ABNORMAL LOW (ref 150–400)
RBC: 4.67 MIL/uL (ref 3.87–5.11)
RDW: 14.2 % (ref 11.5–15.5)
WBC: 5 10*3/uL (ref 4.0–10.5)
nRBC: 0 % (ref 0.0–0.2)

## 2022-01-09 MED ORDER — SODIUM CHLORIDE 0.9% FLUSH
10.0000 mL | Freq: Once | INTRAVENOUS | Status: AC
  Administered 2022-01-09: 10 mL

## 2022-01-09 NOTE — Progress Notes (Signed)
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[facility]) Unfortunately, due to moderate proteinuria, we will hold off bevacizumab today We might need to consider dose adjustment in the future I will order urinalysis and urine culture to rule out other causes of proteinuria Her blood pressure today in home appears to be within normal range  Essential hypertension According to the patient, documented blood pressure at home were within normal range but yet she has produced moderate proteinuria She will continue to check her blood pressure regularly I do not plan to make any adjustment to her blood pressure medication today but will hold off treatment until her next visit  Thrombocytopenia ([place]) Her thrombocytopenia is stable Observe only for now  Chronic back pain greater than 3 months duration She has history of fibromyalgia and chronic back pain She is currently being evaluated By neurosurgeon with MRI imaging studies pending  Orders Placed This Encounter  Procedures   Urine Culture    Standing Status:   Future    Number of Occurrences:   1    Standing Expiration Date:   01/10/2023   Urinalysis, Complete w Microscopic    Standing Status:   Future    Number of Occurrences:   1    Standing Expiration Date:   01/10/2023    All questions were answered. The patient knows to call the clinic with any problems, questions or concerns. The total time spent in the appointment was 30 minutes encounter with patients including review of chart and various tests results, discussions about plan of care and coordination of care plan   [name], MD 01/09/2022 9:15 AM  INTERVAL HISTORY: Please see below for problem oriented charting. she returns for treatment  follow-up According to the patient, documented blood pressure from home were within normal range She is being evaluated by neurosurgeon for chronic back pain, stable Denies abdominal pain and bloating She denies recent urinary frequency, urgency or dysuria  REVIEW OF SYSTEMS:   Constitutional: Denies fevers, chills or abnormal weight loss Eyes: Denies blurriness of vision Ears, nose, mouth, throat, and face: Denies mucositis or sore throat Respiratory: Denies cough, dyspnea or wheezes Cardiovascular: Denies palpitation, chest discomfort or lower extremity swelling Gastrointestinal:  Denies nausea, heartburn or change in bowel habits Skin: Denies abnormal skin rashes Lymphatics: Denies new lymphadenopathy or easy bruising Neurological:Denies numbness, tingling or new weaknesses Behavioral/Psych: Mood is stable, no new changes  All other systems were reviewed with the patient and are negative.  I have reviewed the past medical history, past surgical history, social history and family history with the patient and they are unchanged from previous note.  ALLERGIES:  is allergic to [employer] tartrate], bee venom, and cortisone.  MEDICATIONS:  Current Outpatient Medications  Medication Sig Dispense Refill   amLODipine (NORVASC) 10 MG tablet Take 1 tablet (10 mg total) by mouth daily. 30 tablet 11   bevacizumab in sodium chloride 0.9 % 100 mL Inject into the vein once.     busPIRone (BUSPAR) 7.5 MG tablet Take 15 mg by mouth 2 (two) times daily.     chlorthalidone (HYGROTON) 25 MG tablet Take 25 mg by mouth daily.     cholecalciferol (VITAMIN D3) 25 MCG (1000 UT) tablet Take 1,000 Units by mouth daily.  colestipol (COLESTID) 1 g tablet Take 2 g by mouth at bedtime.     eszopiclone (LUNESTA) 2 MG TABS tablet Take 1 tablet (2 mg total) by mouth at bedtime. TAKE ONE TABLET BY MOUTH EVERYDAY AT BEDTIME 60 tablet 5   ezetimibe (ZETIA) 10 MG tablet Take 10 mg by mouth once.     FIBER PO  Take by mouth.     FLUoxetine (PROZAC) 20 MG capsule Take 40 mg by mouth every morning. Takes with a 49m capsule for her total dose of 334m     lidocaine-prilocaine (EMLA) cream Apply 1 application topically as needed. 30 g 1   MELATONIN PO Take 10 mg by mouth at bedtime.      metFORMIN (GLUCOPHAGE-XR) 500 MG 24 hr tablet Take 500 mg by mouth daily with breakfast.   3   metoprolol tartrate (LOPRESSOR) 25 MG tablet Take 1 tablet (25 mg total) by mouth 2 (two) times daily. 60 tablet 3   modafinil (PROVIGIL) 200 MG tablet Take 1 tablet (200 mg total) by mouth every morning. 30 tablet 5   Multiple Vitamin (MULTIVITAMIN WITH MINERALS) TABS Take 1 tablet by mouth every morning.      rosuvastatin (CRESTOR) 40 MG tablet      telmisartan (MICARDIS) 40 MG tablet Take 40 mg by mouth daily.     No current facility-administered medications for this visit.   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  Interval development of large mid abdominal mass highly concerning for right ovarian cancer.  There is mild omental nodularity on the left, and peritoneal disease  cannot be completely excluded.  There is no ascites or other evidence of metastatic disease. 2.  Mild associated renal pelvocaliectasis bilaterally without obstruction.  Nonobstructing left renal calculus and a small right renal angiomyolipoma noted incidentally.    11/08/2012 Pathology Results   1. 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 - BENIGN FALLOPIAN TUBE WITH NONSPECIFIC CHRONIC INFLAMMATION. 2. Ovary and fallopian tube, left - BENIGN OVARY; NEGATIVE FOR ATYPIA OR MALIGNANCY. - BENIGN FALLOPIAN TUBE; NEGATIVE FOR ATYPIA OR MALIGNANCY. 3. Omentum, resection for tumor - HIGH GRADE CARCINOMA, SEE COMMENT. 4. Appendix, Other than Incidental - FIBROUS OBLITERATION OF APPENDICEAL TIP. - NEGATIVE FOR MALIGNANCY.    11/08/2012 Surgery   Surgery: Exploratory laparotomy, bilateral salpingo-oophorectomy, appendectomy, infacolic omentectomy, optimal debulking   Surgeons:  Paola A. GeAlycia RossettiMD; LiLahoma CrockerMD    Assistant: NaCaswell CorwinPathology: Bilateral fallopian tubes and ovaries to pathology. Appendix as well as omentum. Frozen section of the right ovary revealed at least a mucinous low malignant potential or  borderline tumor of the ovary.   Operative findings: 25 cm right adnexal mass with smooth surface. Surgically absent uterus. Atrophic-appearing left ovary. Normal appearing appendix. Within the omentum there were centimeter nodules scattered throughout the omentum. The remainder of the surfaces were benign.    12/08/2012 Procedure   Impression:  Placement of a subcutaneous port device.  The catheter tip is in the lower SVC and ready to be used.      12/13/2012 - 03/28/2013 Chemotherapy   s/p 6 cycles of paclitaxel and carboplatin    12/13/2012 - 03/28/2013 Chemotherapy   The patient had 6 cycles of carboplatin and  Taxol    03/24/2013 Imaging   US abdomen    04/24/2013 Imaging   CT abdomen Interval resection of the large right pelvic and lower abdominal mass lesion with apparent omentectomy.  No evidence for intraperitoneal free fluid on today's study.  No discernible peritoneal lesions.   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    09/07/2013 Genetic Testing   Patient has genetic testing done for BRCA1/2 panel Results revealed patient has no mutation(s):    07/09/2015 Imaging   CT abdomen 1. 12 mm obstructive calculus at the left ureteropelvic junction with moderate proximal hydronephrosis. 2. 2 small supraumbilical ventral hernias, one containing a short segment of the mid transverse colon and the other containing a short segment of the mid small bowel. There is no associated evidence to suggest bowel incarceration or obstruction at this time. 3. Tiny locule of gas non dependently in the lumen of the urinary bladder. This is presumably iatrogenic related to recent catheterization for urinalysis. Alternatively, this could be seen in the setting of urinary tract infection with gas-forming organisms. Clinical correlation for history of recent catheterization is recommended. 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 5. Status post cholecystectomy. 6. Additional incidental findings, as above.      12/11/2016 Imaging   Ct abdomen 1. No evidence of metastatic ovarian cancer. 2. Recurrent subxiphoid ventral abdominal wall hernia containing transverse colon. No evidence of incarceration or obstruction. 3. Stable incidental findings in the liver and kidneys. No recurrent urinary tract calculus. 4. Progressive lower lumbar spondylosis. 5.  Aortic Atherosclerosis (ICD10-I70.0).      08/30/2017 Imaging   MRI thoracic spine 1. At T5-6 there is a small central disc protrusion contacting the ventral thoracic spinal cord. No central canal or foraminal stenosis. 2. At T9-10 there  is a small right paracentral disc protrusion. 3.  No acute osseous injury of the thoracic spine. 4. No aggressive osseous lesion to suggest metastatic disease.    09/24/2017 Tumor Marker   Patient's tumor was tested for the following markers: CA-125 Results of the tumor marker test revealed 21.7    09/30/2017 Imaging   CT abdomen 1. New small clustered soft tissue nodules in the left lower quadrant in the sigmoid mesentery, largest 1.0 cm, which could represent recurrent peritoneal tumor implants. No ascites.  2. Midline high ventral abdominal wall hernia containing a portion of the transverse colon is mildly increased in size, and without bowel complication at this time. 3. Chronic findings include: Aortic Atherosclerosis (ICD10-I70.0). Diffuse hepatic steatosis. 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    10/11/2017 PET scan   1. Nodules in the sigmoid mesentery are hypermetabolic and highly worrisome for metastatic disease. 2. Attic steatosis.      11/03/2017 Procedure   Successful CT-guided rectus abdominal muscle mass core biopsy. Path: -  FOREIGN BODY GIANT CELL REACTION INVOLVING FIBROADIPOSE TISSUE AND SKELETAL MUSCLE. - NO EVIDENCE OF MALIGNANCY.   11/04/2017 Cancer Staging   Staging form: Fallopian Tube, AJCC 7th Edition - Clinical: FIGO Stage IIIC, calculated as Stage IV (rT3, N0, M1) - Signed by [name], MD on 08/09/2019    02/2018 Imaging   CT: 1. Continued increase in size small peritoneal nodules along the mesenteric border of the proximal sigmoid colon as well as along the serosal surface of the proximal sigmoid colon. 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 2. No evidence of distant disease. 3. Stable large ventral hernia.   05/2018 Imaging   PET: 1. Redemonstration of hypermetabolic nodules within the sigmoid mesocolon. Mild response to therapy relative to CT of 02/24/2018.  Mixed response to therapy compared to the most recent PET of 10/11/2017. 2. Hypermetabolism within the right pelvic rectus musculature, increased since the prior PET.  Clinical service requested comparison to the 11/24/2017 CT. Index 10 mm nodule within the sigmoid mesocolon was similar to the 02/24/2018 CT, and as described on that exam, increased from 7 mm on 11/24/2017. More inferior nodule within the mesocolon measures 12 mm today on image 156/4 and 8 mm on 11/24/2017.   05/2018 Imaging   CT: IMPRESSION: 1. Since 02/24/2018, decreased size of peritoneal nodules centered in the sigmoid mesocolon. 2. No evidence of new or progressive disease. 3. Hepatic steatosis. 4. Subcentimeter right renal angiomyolipoma, similar. 5.  Aortic Atherosclerosis (ICD10-I70.0). 6. Ventral abdominal wall laxity containing transverse colon, similar.   08/2018 Imaging   CT: IMPRESSION: 1. Nodules within the sigmoid mesocolon have decreased in size compared to prior. No new peritoneal or omental nodularity. 2. No evidence local recurrence the pelvis. 3. Ventral hernia contains a segment of transverse colon. No change from prior.     06/19/2019 Imaging   1. No evidence of metastatic disease in the abdomen pelvis. 2. No peritoneal or omental metastasis identified.  No free fluid. 3. Postcholecystectomy and hysterectomy. Comparison exams are made available. Comparison CT 09/08/2018 and 05/27/2018. PET-CT 05/27/2018    There is a nodule within the proximal aspect of the sigmoid colon measuring 2.2 by 2.2 cm. In comparison to prior CTs and PET-CT there was a hypermetabolic nodule at this location on the PET-CT of 08/27/2017 and on the CT of 09/08/2018 there was a small residual nodule. At that time (09/08/2018) the nodule measured 1.4 by 1.3 cm. Therefore this nodule has increased in the interval and concerning for recurrence of a serosal implant. The previous described lymph nodes in the sigmoid mesocolon and  more central mesentery are not increased in size and not pathologic by size criteria.    Concern for recurrence of serosal metastasis in the proximal sigmoid colon with a 2 cm enlarging lesion. Lesion extends into the lumen. No evidence of high-grade obstruction. Consider FDG PET scan and/or potential colonoscopy for evaluation.   06/29/2019 PET scan   1. Enlarging serosal implant within the proximal sigmoid colon has intense metabolic activity most consistent with malignancy. Lesion exhibits a somewhat indolent progression as present on PET-CT scan from 10/11/2017 and 05/27/2018. 2. Focal hypermetabolic activity within the RIGHT rectus muscle just off midline is slightly decreased from comparison exam. This may be benign inflammation related to prior laparotomy, however malignancy not excluded.   07/26/2019 Procedure   She had colonoscopy which showed multiple polyps.  6 polyps were removed from the cecum, measuring 3 to 6 mm in size.  One 12 mm  polyp was removed from ascending colon and another 1 measures 7 mm.  One 5 mm polyp was removed from the transverse colon.  There is tumor noted in the sigmoid colon, approximately 35 cm from the anus which was biopsied.  The tumor appeared to be fungating, infiltrative and ulcerated but nonobstructive.  It encompassed approximately one third of the circumference of the lumen.   07/26/2019 Pathology Results   Multiple polyps came back tubular adenomas.  2 ascending polyps came back sessile serrated adenoma months.  Sigmoid colon biopsy confirmed metastatic high-grade serous carcinoma.  The morphology and immunophenotype are consistent with metastatic high-grade serous carcinoma from tubal/ovarian primary site.   08/17/2019 - 12/12/2019 Chemotherapy   The patient had carboplatin and taxol   10/23/2019 Imaging   1. Sigmoid lesion with diminished size/conspicuity difficult to measure on the previous exam. Adjacent lymph nodes and nodularity less than a cm,  largest on coronal image 48 measuring 5 mm. 2. Right rectus muscle with some thickening with mildly convex margin seen posteriorly on image 72 of series 2. This could be due to postsurgical change, however, metastatic disease is not excluded. Attention on follow-up is suggested. 3. No evidence for new metastatic disease. 4. Small hiatal hernia. 5. 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   Aortic Atherosclerosis (ICD10-I70.0).   01/01/2020 Imaging   1. No signs of new disease. 2. Tiny lymph nodes in the area of concern in the LEFT lower quadrant z. 3. Added density and subtle contour irregularity involving the rectus muscles best seen on sagittal images, associated with site of prior surgical incision just to the RIGHT of midline (image 72, series 2 and image 58, series 5. Not significantly changed compared to prior studies. This measures approximately 2.1 x 1 cm in the sagittal plane and is less well-defined in the axial plane. Potentially postoperative change. Attention on follow-up. 4. Aortic atherosclerosis.   Aortic Atherosclerosis (ICD10-I70.0).       01/29/2020 -  Chemotherapy   Patient is on Treatment Plan : Ovarian Bevacizumab      05/06/2020 Imaging   1. Status post hysterectomy and oophorectomy. 2. No specific findings of recurrent or metastatic disease in the abdomen or pelvis. 3. No change in postoperative appearance of low midline incision. 4. Unchanged small prominent subcentimeter left iliac lymph nodes.   10/22/2020 Imaging   1. Status post hysterectomy and oophorectomy. No evidence of recurrent or metastatic disease within the abdomen or pelvis. 2. No significant change in the subcentimeter prominent left iliac lymph nodes.   04/11/2021 Imaging   1. No findings to suggest recurrent or metastatic disease in the abdomen or pelvis. 2. Aortic atherosclerosis, in addition to at least left anterior descending coronary artery disease. Assessment for potential risk factor  modification, dietary therapy or pharmacologic therapy may be warranted, if clinically indicated. 3. Additional incidental findings, as above.   10/15/2021 Imaging   Stable exam. No evidence of recurrent or metastatic carcinoma within the abdomen or pelvis.   Stable tiny benign right renal angiomyolipoma.   Aortic Atherosclerosis (ICD10-I70.0).       PHYSICAL EXAMINATION: ECOG PERFORMANCE STATUS: 1 - Symptomatic but completely ambulatory  Vitals:   01/09/22 0800  BP: 118/80  Pulse: 63  Resp: 18  Temp: 97.6 F (36.4 C)  SpO2: 99%   Filed Weights   01/09/22 0800  Weight: 197 lb 7.5 oz (89.6 kg)    GENERAL:alert, no distress and comfortable NEURO: alert & oriented x 3 with fluent speech, no  focal motor/sensory deficits  LABORATORY DATA:  I have reviewed the data as listed    Component Value Date/Time   NA 140 01/09/2022 0734   NA 141 12/02/2016 1219   K 4.4 01/09/2022 0734   K 3.7 12/02/2016 1219   CL 106 01/09/2022 0734   CL 103 01/18/2013 1329   CO2 27 01/09/2022 0734   CO2 27 12/02/2016 1219   GLUCOSE 115 (H) 01/09/2022 0734   GLUCOSE 98 12/02/2016 1219   GLUCOSE 110 (H) 01/18/2013 1329   BUN 20 01/09/2022 0734   BUN 15.3 12/02/2016 1219   CREATININE 0.80 01/09/2022 0734   CREATININE 0.71 05/23/2021 0758   CREATININE 0.7 12/02/2016 1219   CALCIUM 9.1 01/09/2022 0734   CALCIUM 10.1 12/02/2016 1219   PROT 6.4 (L) 01/09/2022 0734   PROT 7.1 12/02/2016 1219   ALBUMIN 4.1 01/09/2022 0734   ALBUMIN 4.2 12/02/2016 1219   AST 26 01/09/2022 0734   AST 25 05/23/2021 0758   AST 21 12/02/2016 1219   ALT 30 01/09/2022 0734   ALT 26 05/23/2021 0758   ALT 27 12/02/2016 1219   ALKPHOS 67 01/09/2022 0734   ALKPHOS 104 12/02/2016 1219   BILITOT 0.5 01/09/2022 0734   BILITOT 0.6 05/23/2021 0758   BILITOT 0.37 12/02/2016 1219   GFRNONAA >60 01/09/2022 0734   GFRNONAA >60 05/23/2021 0758   GFRAA >60 05/07/2020 0833    No results found for: SPEP, UPEP  Lab  Results  Component Value Date   WBC 5.0 01/09/2022   NEUTROABS 2.6 01/09/2022   HGB 13.9 01/09/2022   HCT 41.8 01/09/2022   MCV 89.5 01/09/2022   PLT 127 (L) 01/09/2022      Chemistry      Component Value Date/Time   NA 140 01/09/2022 0734   NA 141 12/02/2016 1219   K 4.4 01/09/2022 0734   K 3.7 12/02/2016 1219   CL 106 01/09/2022 0734   CL 103 01/18/2013 1329   CO2 27 01/09/2022 0734   CO2 27 12/02/2016 1219   BUN 20 01/09/2022 0734   BUN 15.3 12/02/2016 1219   CREATININE 0.80 01/09/2022 0734   CREATININE 0.71 05/23/2021 0758   CREATININE 0.7 12/02/2016 1219   GLU 236 12/13/2012 1558      Component Value Date/Time   CALCIUM 9.1 01/09/2022 0734   CALCIUM 10.1 12/02/2016 1219   ALKPHOS 67 01/09/2022 0734   ALKPHOS 104 12/02/2016 1219   AST 26 01/09/2022 0734   AST 25 05/23/2021 0758   AST 21 12/02/2016 1219   ALT 30 01/09/2022 0734   ALT 26 05/23/2021 0758   ALT 27 12/02/2016 1219   BILITOT 0.5 01/09/2022 0734   BILITOT 0.6 05/23/2021 0758   BILITOT 0.37 12/02/2016 1219

## 2022-01-09 NOTE — Assessment & Plan Note (Signed)
Unfortunately, due to moderate proteinuria, we will hold off bevacizumab today We might need to consider dose adjustment in the future I will order urinalysis and urine culture to rule out other causes of proteinuria Her blood pressure today in home appears to be within normal range

## 2022-01-09 NOTE — Assessment & Plan Note (Signed)
According to the patient, documented blood pressure at home were within normal range but yet she has produced moderate proteinuria She will continue to check her blood pressure regularly I do not plan to make any adjustment to her blood pressure medication today but will hold off treatment until her next visit

## 2022-01-09 NOTE — Assessment & Plan Note (Signed)
She has history of fibromyalgia and chronic back pain She is currently being evaluated By neurosurgeon with MRI imaging studies pending

## 2022-01-09 NOTE — Assessment & Plan Note (Signed)
Her thrombocytopenia is stable Observe only for now 

## 2022-01-11 LAB — URINE CULTURE

## 2022-01-12 ENCOUNTER — Telehealth: Payer: Self-pay

## 2022-01-12 NOTE — Telephone Encounter (Signed)
-----   Message from [name], MD sent at 01/12/2022  9:00 AM EDT ----- Pls tell her urine culture is neg I suspect if she was dehydrated coming in, the concentrated urine might have a lot of protein I suggest she drinks a few cups of water in the morning before she checks in next time in addition to monitoring her BP

## 2022-01-12 NOTE — Telephone Encounter (Signed)
Called and left below message. Ask her to call the office back for questions. 

## 2022-01-12 NOTE — Telephone Encounter (Signed)
Called back and given below message. She verbalized understanding and appreciated the call back.

## 2022-01-14 ENCOUNTER — Ambulatory Visit (HOSPITAL_COMMUNITY)
Admission: RE | Admit: 2022-01-14 | Discharge: 2022-01-14 | Disposition: A | Discharge status: Home / Self Care | Payer: Medicare HMO | Source: Ambulatory Visit | Attending: Neurosurgery | Admitting: Neurosurgery

## 2022-01-14 DIAGNOSIS — Z8543 Personal history of malignant neoplasm of ovary: Secondary | ICD-10-CM | POA: Diagnosis not present

## 2022-01-14 DIAGNOSIS — M47816 Spondylosis without myelopathy or radiculopathy, lumbar region: Secondary | ICD-10-CM | POA: Diagnosis not present

## 2022-01-14 DIAGNOSIS — M47812 Spondylosis without myelopathy or radiculopathy, cervical region: Secondary | ICD-10-CM | POA: Diagnosis not present

## 2022-01-14 DIAGNOSIS — M5416 Radiculopathy, lumbar region: Secondary | ICD-10-CM | POA: Insufficient documentation

## 2022-01-14 DIAGNOSIS — M5124 Other intervertebral disc displacement, thoracic region: Secondary | ICD-10-CM | POA: Diagnosis not present

## 2022-01-14 DIAGNOSIS — M5414 Radiculopathy, thoracic region: Secondary | ICD-10-CM | POA: Diagnosis not present

## 2022-01-14 DIAGNOSIS — D1809 Hemangioma of other sites: Secondary | ICD-10-CM | POA: Diagnosis not present

## 2022-01-14 DIAGNOSIS — M47814 Spondylosis without myelopathy or radiculopathy, thoracic region: Secondary | ICD-10-CM | POA: Diagnosis not present

## 2022-01-14 DIAGNOSIS — M65811 Other synovitis and tenosynovitis, right shoulder: Secondary | ICD-10-CM | POA: Diagnosis not present

## 2022-01-14 DIAGNOSIS — M5126 Other intervertebral disc displacement, lumbar region: Secondary | ICD-10-CM | POA: Diagnosis not present

## 2022-01-14 DIAGNOSIS — M19011 Primary osteoarthritis, right shoulder: Secondary | ICD-10-CM | POA: Diagnosis not present

## 2022-01-14 DIAGNOSIS — M5412 Radiculopathy, cervical region: Secondary | ICD-10-CM

## 2022-01-14 DIAGNOSIS — M25511 Pain in right shoulder: Secondary | ICD-10-CM

## 2022-01-14 DIAGNOSIS — M5137 Other intervertebral disc degeneration, lumbosacral region: Secondary | ICD-10-CM | POA: Diagnosis not present

## 2022-01-14 MED ORDER — GADOBUTROL 1 MMOL/ML IV SOLN
10.0000 mL | Freq: Once | INTRAVENOUS | Status: AC | PRN
  Administered 2022-01-14: 10 mL via INTRAVENOUS

## 2022-01-19 NOTE — Telephone Encounter (Signed)
Pt is requesting a night medication because she had to stop taking the trazodone because of the side of effects. Please call patient

## 2022-01-20 MED ORDER — BELSOMRA 15 MG PO TABS: 15.0000 mg | ORAL_TABLET | Freq: Every evening | ORAL | 2 refills | Status: DC

## 2022-01-20 NOTE — Telephone Encounter (Signed)
I am so sorry she experienced this side effect, and lasting effect.  Can get back on Lunesta , I will offer her to take 2 mg with Belsomra,  and melatonin. If that doesn't work for her, will consider RESTORIL.

## 2022-01-20 NOTE — Telephone Encounter (Signed)
Called the patient and advised that Dr Brett Fairy would be willing to add Belsomra 15 mg along with the 2 mg Lunesta and melatonin. Medication has been forwarded to the pharmacy and advised it may or may not require PA. Pt verbalized understanding. Pt had no questions at this time but was encouraged to call back if questions arise.

## 2022-01-20 NOTE — Addendum Note (Signed)
Addended by: Larey Seat on: 01/20/2022 04:05 PM   Modules accepted: Orders

## 2022-01-22 DIAGNOSIS — M5414 Radiculopathy, thoracic region: Secondary | ICD-10-CM | POA: Diagnosis not present

## 2022-01-22 DIAGNOSIS — M5412 Radiculopathy, cervical region: Secondary | ICD-10-CM | POA: Diagnosis not present

## 2022-01-28 ENCOUNTER — Telehealth: Payer: Self-pay | Admitting: Neurology

## 2022-01-28 NOTE — Telephone Encounter (Signed)
Called the pharmacy to advise the patient will be taking both. She verbalized understanding and was appreciative for the call back

## 2022-01-28 NOTE — Telephone Encounter (Signed)
Alley from [employer] is asking if Dr Brett Fairy is wanting Suvorexant (BELSOMRA) 15 MG TABS to replace pt's eszopiclone (LUNESTA) 2 MG TABS tablet or if pt is to take both, please call

## 2022-01-29 ENCOUNTER — Encounter: Payer: Self-pay | Admitting: Hematology and Oncology

## 2022-01-29 ENCOUNTER — Inpatient Hospital Stay (HOSPITAL_BASED_OUTPATIENT_CLINIC_OR_DEPARTMENT_OTHER): Payer: Medicare HMO | Admitting: Hematology and Oncology

## 2022-01-29 ENCOUNTER — Other Ambulatory Visit: Payer: Self-pay

## 2022-01-29 ENCOUNTER — Inpatient Hospital Stay: Payer: Medicare HMO

## 2022-01-29 VITALS — Temp 98.0°F

## 2022-01-29 VITALS — BP 155/85 | HR 63 | Resp 18 | Ht 59.5 in | Wt 200.0 lb

## 2022-01-29 DIAGNOSIS — M797 Fibromyalgia: Secondary | ICD-10-CM | POA: Diagnosis not present

## 2022-01-29 DIAGNOSIS — C5700 Malignant neoplasm of unspecified fallopian tube: Secondary | ICD-10-CM | POA: Diagnosis not present

## 2022-01-29 DIAGNOSIS — M549 Dorsalgia, unspecified: Secondary | ICD-10-CM

## 2022-01-29 DIAGNOSIS — D696 Thrombocytopenia, unspecified: Secondary | ICD-10-CM

## 2022-01-29 DIAGNOSIS — G8929 Other chronic pain: Secondary | ICD-10-CM | POA: Diagnosis not present

## 2022-01-29 DIAGNOSIS — C5701 Malignant neoplasm of right fallopian tube: Secondary | ICD-10-CM

## 2022-01-29 DIAGNOSIS — R809 Proteinuria, unspecified: Secondary | ICD-10-CM | POA: Diagnosis not present

## 2022-01-29 DIAGNOSIS — C785 Secondary malignant neoplasm of large intestine and rectum: Secondary | ICD-10-CM | POA: Diagnosis not present

## 2022-01-29 DIAGNOSIS — C561 Malignant neoplasm of right ovary: Secondary | ICD-10-CM | POA: Diagnosis not present

## 2022-01-29 DIAGNOSIS — Z5111 Encounter for antineoplastic chemotherapy: Secondary | ICD-10-CM | POA: Diagnosis not present

## 2022-01-29 DIAGNOSIS — I10 Essential (primary) hypertension: Secondary | ICD-10-CM | POA: Diagnosis not present

## 2022-01-29 DIAGNOSIS — Z7189 Other specified counseling: Secondary | ICD-10-CM

## 2022-01-29 LAB — COMPREHENSIVE METABOLIC PANEL
ALT: 24 U/L (ref 0–44)
AST: 22 U/L (ref 15–41)
Albumin: 4.2 g/dL (ref 3.5–5.0)
Alkaline Phosphatase: 59 U/L (ref 38–126)
Anion gap: 8 (ref 5–15)
BUN: 17 mg/dL (ref 8–23)
CO2: 28 mmol/L (ref 22–32)
Calcium: 9.8 mg/dL (ref 8.9–10.3)
Chloride: 103 mmol/L (ref 98–111)
Creatinine, Ser: 0.79 mg/dL (ref 0.44–1.00)
GFR, Estimated: >60 mL/min (ref >60)
Glucose, Bld: 120 mg/dL — ABNORMAL HIGH (ref 70–99)
Potassium: 4 mmol/L (ref 3.5–5.1)
Sodium: 139 mmol/L (ref 135–145)
Total Bilirubin: 0.5 mg/dL (ref 0.3–1.2)
Total Protein: 6.8 g/dL (ref 6.5–8.1)

## 2022-01-29 LAB — CBC WITH DIFFERENTIAL/PLATELET
Abs Immature Granulocytes: 0.02 10*3/uL (ref 0.00–0.07)
Basophils Absolute: 0 10*3/uL (ref 0.0–0.1)
Basophils Relative: 0 %
Eosinophils Absolute: 0.1 10*3/uL (ref 0.0–0.5)
Eosinophils Relative: 2 %
HCT: 40.6 % (ref 36.0–46.0)
Hemoglobin: 13.6 g/dL (ref 12.0–15.0)
Immature Granulocytes: 0 %
Lymphocytes Relative: 31 %
Lymphs Abs: 1.7 10*3/uL (ref 0.7–4.0)
MCH: 30 pg (ref 26.0–34.0)
MCHC: 33.5 g/dL (ref 30.0–36.0)
MCV: 89.6 fL (ref 80.0–100.0)
Monocytes Absolute: 0.6 10*3/uL (ref 0.1–1.0)
Monocytes Relative: 11 %
Neutro Abs: 2.9 10*3/uL (ref 1.7–7.7)
Neutrophils Relative %: 56 %
Platelets: 130 10*3/uL — ABNORMAL LOW (ref 150–400)
RBC: 4.53 MIL/uL (ref 3.87–5.11)
RDW: 13.8 % (ref 11.5–15.5)
WBC: 5.4 10*3/uL (ref 4.0–10.5)
nRBC: 0 % (ref 0.0–0.2)

## 2022-01-29 MED ORDER — SODIUM CHLORIDE 0.9 % IV SOLN
10.0000 mg/kg | Freq: Once | INTRAVENOUS | Status: AC
  Administered 2022-01-29: 900 mg via INTRAVENOUS
  Filled 2022-01-29: qty 32

## 2022-01-29 MED ORDER — SODIUM CHLORIDE 0.9 % IV SOLN: Freq: Once | INTRAVENOUS | Status: AC

## 2022-01-29 MED ORDER — SODIUM CHLORIDE 0.9% FLUSH
10.0000 mL | INTRAVENOUS | Status: DC | PRN
  Administered 2022-01-29: 10 mL

## 2022-01-29 MED ORDER — HEPARIN SOD (PORK) LOCK FLUSH 100 UNIT/ML IV SOLN
500.0000 [IU] | Freq: Once | INTRAVENOUS | Status: AC | PRN
  Administered 2022-01-29: 500 [IU]

## 2022-01-29 MED ORDER — SODIUM CHLORIDE 0.9% FLUSH
10.0000 mL | Freq: Once | INTRAVENOUS | Status: AC
  Administered 2022-01-29: 10 mL

## 2022-01-29 NOTE — Assessment & Plan Note (Signed)
Her thrombocytopenia is stable Observe only for now 

## 2022-01-29 NOTE — Assessment & Plan Note (Signed)
She has history of fibromyalgia and chronic back pain I have reviewed multiple MRI findings which show degenerative changes consistent with aging She will continue conservative management We discussed importance of lifestyle changes and weight loss

## 2022-01-29 NOTE — Patient Instructions (Signed)
[facility]  Discharge Instructions: Thank you for choosing [facility] to provide your oncology and hematology care.   If you have a lab appointment with the Cancer Center, please go directly to the Cancer Center and check in at the registration area.   Wear comfortable clothing and clothing appropriate for easy access to any Portacath or PICC line.   We strive to give you quality time with your provider. You may need to reschedule your appointment if you arrive late (15 or more minutes).  Arriving late affects you and other patients whose appointments are after yours.  Also, if you miss three or more appointments without notifying the office, you may be dismissed from the clinic at the provider's discretion.      For prescription refill requests, have your pharmacy contact our office and allow 72 hours for refills to be completed.    Today you received the following chemotherapy and/or immunotherapy agents Bevacizumab-bvzr (Zirabev).      To help prevent nausea and vomiting after your treatment, we encourage you to take your nausea medication as directed.  BELOW ARE SYMPTOMS THAT SHOULD BE REPORTED IMMEDIATELY: *FEVER GREATER THAN 100.4 F (38 C) OR HIGHER *CHILLS OR SWEATING *NAUSEA AND VOMITING THAT IS NOT CONTROLLED WITH YOUR NAUSEA MEDICATION *UNUSUAL SHORTNESS OF BREATH *UNUSUAL BRUISING OR BLEEDING *URINARY PROBLEMS (pain or burning when urinating, or frequent urination) *BOWEL PROBLEMS (unusual diarrhea, constipation, pain near the anus) TENDERNESS IN MOUTH AND THROAT WITH OR WITHOUT PRESENCE OF ULCERS (sore throat, sores in mouth, or a toothache) UNUSUAL RASH, SWELLING OR PAIN  UNUSUAL VAGINAL DISCHARGE OR ITCHING   Items with * indicate a potential emergency and should be followed up as soon as possible or go to the Emergency Department if any problems should occur.  Please show the CHEMOTHERAPY ALERT CARD or IMMUNOTHERAPY ALERT  CARD at check-in to the Emergency Department and triage nurse.  Should you have questions after your visit or need to cancel or reschedule your appointment, please contact [facility]  Dept: [phone]  and follow the prompts.  Office hours are 8:00 a.m. to 4:30 p.m. Monday - Friday. Please note that voicemails left after 4:00 p.m. may not be returned until the following business day.  We are closed weekends and major holidays. You have access to a nurse at all times for urgent questions. Please call the main number to the clinic Dept: [phone] and follow the prompts.   For any non-urgent questions, you may also contact your provider using MyChart. We now offer e-Visits for anyone 18 and older to request care online for non-urgent symptoms. For details visit [email or web address].   Also download the MyChart app! Go to the app store, search "MyChart", open the app, select [facility], and log in with your MyChart username and password.  Due to Covid, a mask is required upon entering the hospital/clinic. If you do not have a mask, one will be given to you upon arrival. For doctor visits, patients may have 1 support person aged 18 or older with them. For treatment visits, patients cannot have anyone with them due to current Covid guidelines and our immunocompromised population.   

## 2022-01-29 NOTE — Assessment & Plan Note (Signed)
Her blood pressure is improved since we withhold treatment 3 weeks ago We have long discussion about the role of maintenance bevacizumab 2 years beyond completion of chemotherapy We discussed the risk and benefits of continuing treatment She would like to proceed with treatment I will prescribe dose adjustment She will continue blood pressure monitoring at home and will call me if her blood pressure is elevated above systolic 961

## 2022-01-29 NOTE — Assessment & Plan Note (Signed)
According to the patient, documented blood pressure at home were within normal range but yet she had recent proteinuria She will continue to check her blood pressure regularly I do not plan to adjust her blood pressure medications today but will proceed with dose adjustment

## 2022-02-02 ENCOUNTER — Encounter: Payer: Self-pay | Admitting: Neurology

## 2022-02-02 DIAGNOSIS — E782 Mixed hyperlipidemia: Secondary | ICD-10-CM | POA: Diagnosis not present

## 2022-02-02 DIAGNOSIS — G4733 Obstructive sleep apnea (adult) (pediatric): Secondary | ICD-10-CM

## 2022-02-02 DIAGNOSIS — F5102 Adjustment insomnia: Secondary | ICD-10-CM

## 2022-02-02 DIAGNOSIS — E1159 Type 2 diabetes mellitus with other circulatory complications: Secondary | ICD-10-CM | POA: Diagnosis not present

## 2022-02-04 MED ORDER — ESZOPICLONE 3 MG PO TABS: 3.0000 mg | ORAL_TABLET | Freq: Every day | ORAL | 5 refills | Status: DC

## 2022-02-11 ENCOUNTER — Telehealth: Payer: Self-pay | Admitting: Neurology

## 2022-02-11 NOTE — Telephone Encounter (Signed)
PA completed on [employer] [id number] Marked as urgent for the patient. Will await determination

## 2022-02-11 NOTE — Telephone Encounter (Signed)
[facility]  is asking if pt's eszopiclone (LUNESTA) 2 MG TABS tablet  and modafinil (PROVIGIL) 200 MG tablet can be filled today because pt is going out of town tomorrow and will run out before she returns, please call.

## 2022-02-11 NOTE — Telephone Encounter (Signed)
[facility] is calling and would like to know if they can fill th '2mg'$  because Pt is going out of town and the '3mg'$  is asking for a prior authorization.

## 2022-02-11 NOTE — Telephone Encounter (Signed)
Received fax from [facility] that [employer] approved until 08/09/22.

## 2022-02-11 NOTE — Telephone Encounter (Signed)
Called the pharmacy back and advised that they can refill a day early due to the pt leaving for vacation. Also advised that Dr Brett Fairy recommended the patient try lunesta 3 mg. She sent that medication in on 6/28 for the pt to try. Instructed them to fill that for the patient vs the '2mg'$ .

## 2022-02-11 NOTE — Telephone Encounter (Signed)
Called Ariel back and informed her PA approved for '3mg'$ . Should be able to get paid claim now. She re-ran and it went through insurance now. Nothing further needed.

## 2022-02-18 ENCOUNTER — Telehealth: Payer: Self-pay | Admitting: Neurology

## 2022-02-18 NOTE — Telephone Encounter (Signed)
[facility]- [employer] no auth req spoke to [facility] ref # [id number]. Patient is scheduled at [facility] for 03/03/22 at 2 pm. I mailed packet out as well to the patient.

## 2022-02-23 ENCOUNTER — Other Ambulatory Visit: Payer: Self-pay | Admitting: Neurology

## 2022-02-23 NOTE — Addendum Note (Signed)
Addended by: Darleen Crocker on: 02/23/2022 01:28 PM   Modules accepted: Orders

## 2022-02-24 ENCOUNTER — Inpatient Hospital Stay: Payer: Medicare HMO | Attending: Gynecologic Oncology

## 2022-02-24 ENCOUNTER — Encounter: Payer: Self-pay | Admitting: Hematology and Oncology

## 2022-02-24 ENCOUNTER — Inpatient Hospital Stay (HOSPITAL_BASED_OUTPATIENT_CLINIC_OR_DEPARTMENT_OTHER): Payer: Medicare HMO | Admitting: Hematology and Oncology

## 2022-02-24 ENCOUNTER — Other Ambulatory Visit: Payer: Self-pay

## 2022-02-24 ENCOUNTER — Inpatient Hospital Stay: Payer: Medicare HMO

## 2022-02-24 DIAGNOSIS — R809 Proteinuria, unspecified: Secondary | ICD-10-CM | POA: Diagnosis not present

## 2022-02-24 DIAGNOSIS — I10 Essential (primary) hypertension: Secondary | ICD-10-CM

## 2022-02-24 DIAGNOSIS — C5701 Malignant neoplasm of right fallopian tube: Secondary | ICD-10-CM

## 2022-02-24 DIAGNOSIS — C5700 Malignant neoplasm of unspecified fallopian tube: Secondary | ICD-10-CM

## 2022-02-24 DIAGNOSIS — D696 Thrombocytopenia, unspecified: Secondary | ICD-10-CM | POA: Diagnosis not present

## 2022-02-24 DIAGNOSIS — C786 Secondary malignant neoplasm of retroperitoneum and peritoneum: Secondary | ICD-10-CM | POA: Diagnosis not present

## 2022-02-24 LAB — COMPREHENSIVE METABOLIC PANEL
ALT: 24 U/L (ref 0–44)
AST: 22 U/L (ref 15–41)
Albumin: 4.3 g/dL (ref 3.5–5.0)
Alkaline Phosphatase: 75 U/L (ref 38–126)
Anion gap: 7 (ref 5–15)
BUN: 26 mg/dL — ABNORMAL HIGH (ref 8–23)
CO2: 29 mmol/L (ref 22–32)
Calcium: 10.3 mg/dL (ref 8.9–10.3)
Chloride: 103 mmol/L (ref 98–111)
Creatinine, Ser: 0.71 mg/dL (ref 0.44–1.00)
GFR, Estimated: >60 mL/min (ref >60)
Glucose, Bld: 103 mg/dL — ABNORMAL HIGH (ref 70–99)
Potassium: 3.9 mmol/L (ref 3.5–5.1)
Sodium: 139 mmol/L (ref 135–145)
Total Bilirubin: 0.5 mg/dL (ref 0.3–1.2)
Total Protein: 6.9 g/dL (ref 6.5–8.1)

## 2022-02-24 LAB — CBC WITH DIFFERENTIAL/PLATELET
Abs Immature Granulocytes: 0.01 10*3/uL (ref 0.00–0.07)
Basophils Absolute: 0 10*3/uL (ref 0.0–0.1)
Basophils Relative: 1 %
Eosinophils Absolute: 0.2 10*3/uL (ref 0.0–0.5)
Eosinophils Relative: 3 %
HCT: 42.4 % (ref 36.0–46.0)
Hemoglobin: 14.1 g/dL (ref 12.0–15.0)
Immature Granulocytes: 0 %
Lymphocytes Relative: 28 %
Lymphs Abs: 1.7 10*3/uL (ref 0.7–4.0)
MCH: 30.1 pg (ref 26.0–34.0)
MCHC: 33.3 g/dL (ref 30.0–36.0)
MCV: 90.4 fL (ref 80.0–100.0)
Monocytes Absolute: 0.7 10*3/uL (ref 0.1–1.0)
Monocytes Relative: 11 %
Neutro Abs: 3.4 10*3/uL (ref 1.7–7.7)
Neutrophils Relative %: 57 %
Platelets: 145 10*3/uL — ABNORMAL LOW (ref 150–400)
RBC: 4.69 MIL/uL (ref 3.87–5.11)
RDW: 13.7 % (ref 11.5–15.5)
WBC: 6 10*3/uL (ref 4.0–10.5)
nRBC: 0 % (ref 0.0–0.2)

## 2022-02-24 LAB — TOTAL PROTEIN, URINE DIPSTICK: Protein, ur: 100 mg/dL — AB

## 2022-02-24 MED ORDER — SODIUM CHLORIDE 0.9% FLUSH
10.0000 mL | Freq: Once | INTRAVENOUS | Status: AC
  Administered 2022-02-24: 10 mL

## 2022-02-24 MED ORDER — HEPARIN SOD (PORK) LOCK FLUSH 100 UNIT/ML IV SOLN
500.0000 [IU] | Freq: Once | INTRAVENOUS | Status: AC
  Administered 2022-02-24: 500 [IU]

## 2022-02-24 NOTE — Progress Notes (Signed)
Per Dr. Alvy Bimler - will hold avastin treatment today d/t elevated urine protein. Patient will return to clinic again for treatment in 3 weeks.  Charge and infusion RN aware.

## 2022-02-24 NOTE — Assessment & Plan Note (Signed)
Her blood pressure is within range, according to the patient However, urinalysis revealed moderate proteinuria Previously, we had long discussion about the role of maintenance bevacizumab 2 years beyond completion of chemotherapy We discussed the risk and benefits of continuing treatment She would like to proceed with treatment Her treatment will be placed on hold today She will return in 3 weeks for treatment and I plan to reduce the dose further to 7.5 mg/kg

## 2022-02-24 NOTE — Assessment & Plan Note (Signed)
According to the patient, her blood pressure at home were within normal range but urinalysis revealed proteinuria We will hold her treatment She will continue risk factor modification

## 2022-02-24 NOTE — Assessment & Plan Note (Signed)
Her thrombocytopenia is stable Observe only for now

## 2022-02-24 NOTE — Progress Notes (Signed)
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[facility]) Her blood pressure is within range, according to the patient However, urinalysis revealed moderate proteinuria Previously, we had long discussion about the role of maintenance bevacizumab 2 years beyond completion of chemotherapy We discussed the risk and benefits of continuing treatment She would like to proceed with treatment Her treatment will be placed on hold today She will return in 3 weeks for treatment and I plan to reduce the dose further to 7.5 mg/kg  Essential hypertension According to the patient, her blood pressure at home were within normal range but urinalysis revealed proteinuria We will hold her treatment She will continue risk factor modification  Thrombocytopenia (HCC) Her thrombocytopenia is stable Observe only for now  No orders of the defined types were placed in this encounter.   All questions were answered. The patient knows to call the clinic with any problems, questions or concerns. The total time spent in the appointment was 30 minutes encounter with patients including review of chart and various tests results, discussions about plan of care and coordination of care plan   [name], MD 02/24/2022 9:07 AM  INTERVAL HISTORY: Please see below for problem oriented charting. she returns for treatment follow-up on maintenance bevacizumab According to the patient, blood pressure at home were within normal range She denies recent falls She had intermittent diarrhea despite medications that is chronic in nature  REVIEW OF SYSTEMS:   Constitutional: Denies fevers, chills or abnormal weight loss Eyes: Denies blurriness of vision Ears, nose, mouth, throat, and face: Denies  mucositis or sore throat Respiratory: Denies cough, dyspnea or wheezes Cardiovascular: Denies palpitation, chest discomfort or lower extremity swelling Skin: Denies abnormal skin rashes Lymphatics: Denies new lymphadenopathy or easy bruising Neurological:Denies numbness, tingling or new weaknesses Behavioral/Psych: Mood is stable, no new changes  All other systems were reviewed with the patient and are negative.  I have reviewed the past medical history, past surgical history, social history and family history with the patient and they are unchanged from previous note.  ALLERGIES:  is allergic to [employer] tartrate], bee venom, and cortisone.  MEDICATIONS:  Current Outpatient Medications  Medication Sig Dispense Refill   amLODipine (NORVASC) 10 MG tablet Take 1 tablet (10 mg total) by mouth daily. 30 tablet 11   bevacizumab in sodium chloride 0.9 % 100 mL Inject into the vein once.     busPIRone (BUSPAR) 7.5 MG tablet Take 15 mg by mouth 2 (two) times daily.     chlorthalidone (HYGROTON) 25 MG tablet Take 25 mg by mouth daily.     cholecalciferol (VITAMIN D3) 25 MCG (1000 UT) tablet Take 1,000 Units by mouth daily.     colestipol (COLESTID) 1 g tablet Take 2 g by mouth at bedtime.     Eszopiclone 3 MG TABS Take 1 tablet (3 mg total) by mouth at bedtime. Take immediately before bedtime 30 tablet 5   ezetimibe (ZETIA) 10 MG tablet Take 10 mg by mouth once.     FIBER PO Take by mouth.     FLUoxetine (PROZAC) 20 MG capsule Take 40 mg by mouth every morning. Takes with a 62m capsule for her total dose of 374m     lidocaine-prilocaine (EMLA) cream Apply 1 application topically as needed. 30Noonan  g 1   MELATONIN PO Take 10 mg by mouth at bedtime.      metFORMIN (GLUCOPHAGE-XR) 500 MG 24 hr tablet Take 500 mg by mouth daily with breakfast.   3   metoprolol tartrate (LOPRESSOR) 25 MG tablet Take 1 tablet (25 mg total) by mouth 2 (two) times daily. 60 tablet 3   modafinil (PROVIGIL) 200 MG  tablet Take 1 tablet (200 mg total) by mouth every morning. 30 tablet 5   Multiple Vitamin (MULTIVITAMIN WITH MINERALS) TABS Take 1 tablet by mouth every morning.      rosuvastatin (CRESTOR) 40 MG tablet      telmisartan (MICARDIS) 40 MG tablet Take 40 mg by mouth daily.     No current facility-administered medications for this visit.   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  Interval development of large mid abdominal mass highly concerning for right ovarian cancer.  There is mild omental nodularity on the left, and peritoneal disease cannot be completely excluded.  There is no ascites or other evidence of metastatic disease. 2.  Mild associated renal pelvocaliectasis bilaterally without obstruction.  Nonobstructing left renal calculus and a small right renal angiomyolipoma noted incidentally.   11/08/2012 Pathology Results   1. 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 - BENIGN FALLOPIAN TUBE WITH  NONSPECIFIC CHRONIC INFLAMMATION. 2. Ovary and fallopian tube, left - BENIGN OVARY; NEGATIVE FOR ATYPIA OR MALIGNANCY. - BENIGN FALLOPIAN TUBE; NEGATIVE FOR ATYPIA OR MALIGNANCY. 3. Omentum, resection for tumor - HIGH GRADE CARCINOMA, SEE COMMENT. 4. Appendix, Other than Incidental - FIBROUS OBLITERATION OF APPENDICEAL TIP. - NEGATIVE FOR MALIGNANCY.   11/08/2012 Surgery   Surgery: Exploratory laparotomy, bilateral salpingo-oophorectomy, appendectomy, infacolic omentectomy, optimal debulking   Surgeons:  Paola A. Alycia Rossetti, MD; Lahoma Crocker, MD    Assistant: Caswell Corwin  Pathology: Bilateral fallopian tubes and ovaries to pathology. Appendix as well as omentum. 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   Operative findings: 25 cm right adnexal mass with smooth surface. Surgically absent uterus. Atrophic-appearing left ovary. Normal appearing appendix. Within the omentum there were centimeter nodules scattered throughout the omentum. The remainder of the surfaces were benign.   12/08/2012 Procedure   Impression:  Placement of a subcutaneous port device.  The catheter tip is in the lower SVC and ready to be used.     12/13/2012 - 03/28/2013 Chemotherapy   s/p 6 cycles of paclitaxel and carboplatin   12/13/2012 - 03/28/2013 Chemotherapy   The patient had 6 cycles of carboplatin and Taxol  03/24/2013 Imaging   US abdomen   04/24/2013 Imaging   CT abdomen Interval resection of the large right pelvic and lower abdominal mass lesion with apparent omentectomy.  No evidence for intraperitoneal free fluid on today's study.  No discernible peritoneal lesions.   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   09/07/2013 Genetic Testing   Patient has genetic testing done for BRCA1/2 panel Results revealed patient has no mutation(s):   07/09/2015 Imaging   CT abdomen 1. 12 mm obstructive calculus at the left  ureteropelvic junction with moderate proximal hydronephrosis. 2. 2 small supraumbilical ventral hernias, one containing a short segment of the mid transverse colon and the other containing a short segment of the mid small bowel. There is no associated evidence to suggest bowel incarceration or obstruction at this time. 3. Tiny locule of gas non dependently in the lumen of the urinary bladder. This is presumably iatrogenic related to recent catheterization for urinalysis. Alternatively, this could be seen in the setting of urinary tract infection with gas-forming organisms. Clinical correlation for history of recent catheterization is recommended. 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 5. Status post cholecystectomy. 6. Additional incidental findings, as above.     12/11/2016 Imaging   Ct abdomen 1. No evidence of metastatic ovarian cancer. 2. Recurrent subxiphoid ventral abdominal wall hernia containing transverse colon. No evidence of incarceration or obstruction. 3. Stable incidental findings in the liver and kidneys. No recurrent urinary tract calculus. 4. Progressive lower lumbar spondylosis. 5.  Aortic Atherosclerosis (ICD10-I70.0).     08/30/2017 Imaging   MRI thoracic spine 1. At T5-6 there is a small central disc protrusion contacting the ventral thoracic spinal cord. No central canal or foraminal stenosis. 2. At T9-10 there is a small right paracentral disc protrusion. 3.  No acute osseous injury of the thoracic spine. 4. No aggressive osseous lesion to suggest metastatic disease.   09/24/2017 Tumor Marker   Patient's tumor was tested for the following markers: CA-125 Results of the tumor marker test revealed 21.7   09/30/2017 Imaging   CT abdomen 1. New small clustered soft tissue nodules in the left lower quadrant in the sigmoid mesentery, largest 1.0 cm, which could represent recurrent peritoneal tumor implants. No ascites.  2. Midline high ventral  abdominal wall hernia containing a portion of the transverse colon is mildly increased in size, and without bowel complication at this time. 3. Chronic findings include: Aortic Atherosclerosis (ICD10-I70.0). Diffuse hepatic steatosis. 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   10/11/2017 PET scan   1. Nodules in the sigmoid mesentery are hypermetabolic and highly worrisome for metastatic disease. 2. Attic steatosis.     11/03/2017 Procedure   Successful CT-guided rectus abdominal muscle mass core biopsy. Path: - FOREIGN BODY GIANT CELL REACTION INVOLVING FIBROADIPOSE TISSUE AND SKELETAL MUSCLE. - NO EVIDENCE OF MALIGNANCY.   11/04/2017 Cancer Staging   Staging form: Fallopian Tube, AJCC 7th Edition - Clinical: FIGO Stage IIIC, calculated as Stage IV (rT3, N0, M1) - Signed by [name], MD on 08/09/2019   02/2018 Imaging   CT: 1. Continued increase in size small peritoneal nodules along the mesenteric border of the proximal sigmoid colon as well as along the serosal surface of the proximal sigmoid colon. 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 2. No evidence of distant disease. 3. Stable large ventral hernia.   05/2018 Imaging   PET: 1. Redemonstration of hypermetabolic nodules within the  sigmoid mesocolon. Mild response to therapy relative to CT of 02/24/2018. Mixed response to therapy compared to the most recent PET of 10/11/2017. 2. Hypermetabolism within the right pelvic rectus musculature, increased since the prior PET.  Clinical service requested comparison to the 11/24/2017 CT. Index 10 mm nodule within the sigmoid mesocolon was similar to the 02/24/2018 CT, and as described on that exam, increased from 7 mm on 11/24/2017. More inferior nodule within the mesocolon measures 12 mm today on image 156/4 and 8 mm on 11/24/2017.   05/2018 Imaging   CT: IMPRESSION: 1. Since 02/24/2018, decreased  size of peritoneal nodules centered in the sigmoid mesocolon. 2. No evidence of new or progressive disease. 3. Hepatic steatosis. 4. Subcentimeter right renal angiomyolipoma, similar. 5.  Aortic Atherosclerosis (ICD10-I70.0). 6. Ventral abdominal wall laxity containing transverse colon, similar.   08/2018 Imaging   CT: IMPRESSION: 1. Nodules within the sigmoid mesocolon have decreased in size compared to prior. No new peritoneal or omental nodularity. 2. No evidence local recurrence the pelvis. 3. Ventral hernia contains a segment of transverse colon. No change from prior.     06/19/2019 Imaging   1. No evidence of metastatic disease in the abdomen pelvis. 2. No peritoneal or omental metastasis identified.  No free fluid. 3. Postcholecystectomy and hysterectomy. Comparison exams are made available. Comparison CT 09/08/2018 and 05/27/2018. PET-CT 05/27/2018    There is a nodule within the proximal aspect of the sigmoid colon measuring 2.2 by 2.2 cm. In comparison to prior CTs and PET-CT there was a hypermetabolic nodule at this location on the PET-CT of 08/27/2017 and on the CT of 09/08/2018 there was a small residual nodule. At that time (09/08/2018) the nodule measured 1.4 by 1.3 cm. Therefore this nodule has increased in the interval and concerning for recurrence of a serosal implant. The previous described lymph nodes in the sigmoid mesocolon and more central mesentery are not increased in size and not pathologic by size criteria.    Concern for recurrence of serosal metastasis in the proximal sigmoid colon with a 2 cm enlarging lesion. Lesion extends into the lumen. No evidence of high-grade obstruction. Consider FDG PET scan and/or potential colonoscopy for evaluation.   06/29/2019 PET scan   1. Enlarging serosal implant within the proximal sigmoid colon has intense metabolic activity most consistent with malignancy. Lesion exhibits a somewhat indolent progression as present on PET-CT  scan from 10/11/2017 and 05/27/2018. 2. Focal hypermetabolic activity within the RIGHT rectus muscle just off midline is slightly decreased from comparison exam. This may be benign inflammation related to prior laparotomy, however malignancy not excluded.   07/26/2019 Procedure   She had colonoscopy which showed multiple polyps.  6 polyps were removed from the cecum, measuring 3 to 6 mm in size.  One 12 mm polyp was removed from ascending colon and another 1 measures 7 mm.  One 5 mm polyp was removed from the transverse colon.  There is tumor noted in the sigmoid colon, approximately 35 cm from the anus which was biopsied.  The tumor appeared to be fungating, infiltrative and ulcerated but nonobstructive.  It encompassed approximately one third of the circumference of the lumen.   07/26/2019 Pathology Results   Multiple polyps came back tubular adenomas.  2 ascending polyps came back sessile serrated adenoma months.  Sigmoid colon biopsy confirmed metastatic high-grade serous carcinoma.  The morphology and immunophenotype are consistent with metastatic high-grade serous carcinoma from tubal/ovarian primary site.   08/17/2019 - 12/12/2019 Chemotherapy  The patient had carboplatin and taxol   10/23/2019 Imaging   1. Sigmoid lesion with diminished size/conspicuity difficult to measure on the previous exam. Adjacent lymph nodes and nodularity less than a cm, largest on coronal image 48 measuring 5 mm. 2. Right rectus muscle with some thickening with mildly convex margin seen posteriorly on image 72 of series 2. This could be due to postsurgical change, however, metastatic disease is not excluded. Attention on follow-up is suggested. 3. No evidence for new metastatic disease. 4. Small hiatal hernia. 5. 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   Aortic Atherosclerosis (ICD10-I70.0).   01/01/2020 Imaging   1. No signs of new disease. 2. Tiny lymph nodes in the area of concern in the LEFT lower quadrant  z. 3. Added density and subtle contour irregularity involving the rectus muscles best seen on sagittal images, associated with site of prior surgical incision just to the RIGHT of midline (image 72, series 2 and image 58, series 5. Not significantly changed compared to prior studies. This measures approximately 2.1 x 1 cm in the sagittal plane and is less well-defined in the axial plane. Potentially postoperative change. Attention on follow-up. 4. Aortic atherosclerosis.   Aortic Atherosclerosis (ICD10-I70.0).       01/29/2020 -  Chemotherapy   Patient is on Treatment Plan : Ovarian Bevacizumab     05/06/2020 Imaging   1. Status post hysterectomy and oophorectomy. 2. No specific findings of recurrent or metastatic disease in the abdomen or pelvis. 3. No change in postoperative appearance of low midline incision. 4. Unchanged small prominent subcentimeter left iliac lymph nodes.   10/22/2020 Imaging   1. Status post hysterectomy and oophorectomy. No evidence of recurrent or metastatic disease within the abdomen or pelvis. 2. No significant change in the subcentimeter prominent left iliac lymph nodes.   04/11/2021 Imaging   1. No findings to suggest recurrent or metastatic disease in the abdomen or pelvis. 2. Aortic atherosclerosis, in addition to at least left anterior descending coronary artery disease. Assessment for potential risk factor modification, dietary therapy or pharmacologic therapy may be warranted, if clinically indicated. 3. Additional incidental findings, as above.   10/15/2021 Imaging   Stable exam. No evidence of recurrent or metastatic carcinoma within the abdomen or pelvis.   Stable tiny benign right renal angiomyolipoma.   Aortic Atherosclerosis (ICD10-I70.0).       PHYSICAL EXAMINATION: ECOG PERFORMANCE STATUS: 2 - Symptomatic, <50% confined to bed  Vitals:   02/24/22 0815  Pulse: 66  Resp: 18  SpO2: 98%   There were no vitals filed for this  visit.  GENERAL:alert, no distress and comfortable NEURO: alert & oriented x 3 with fluent speech, no focal motor/sensory deficits.  She has chronic balance issues  LABORATORY DATA:  I have reviewed the data as listed    Component Value Date/Time   NA 139 02/24/2022 0747   NA 141 12/02/2016 1219   K 3.9 02/24/2022 0747   K 3.7 12/02/2016 1219   CL 103 02/24/2022 0747   CL 103 01/18/2013 1329   CO2 29 02/24/2022 0747   CO2 27 12/02/2016 1219   GLUCOSE 103 (H) 02/24/2022 0747   GLUCOSE 98 12/02/2016 1219   GLUCOSE 110 (H) 01/18/2013 1329   BUN 26 (H) 02/24/2022 0747   BUN 15.3 12/02/2016 1219   CREATININE 0.71 02/24/2022 0747   CREATININE 0.71 05/23/2021 0758   CREATININE 0.7 12/02/2016 1219   CALCIUM 10.3 02/24/2022 0747   CALCIUM 10.1 12/02/2016 1219  PROT 6.9 02/24/2022 0747   PROT 7.1 12/02/2016 1219   ALBUMIN 4.3 02/24/2022 0747   ALBUMIN 4.2 12/02/2016 1219   AST 22 02/24/2022 0747   AST 25 05/23/2021 0758   AST 21 12/02/2016 1219   ALT 24 02/24/2022 0747   ALT 26 05/23/2021 0758   ALT 27 12/02/2016 1219   ALKPHOS 75 02/24/2022 0747   ALKPHOS 104 12/02/2016 1219   BILITOT 0.5 02/24/2022 0747   BILITOT 0.6 05/23/2021 0758   BILITOT 0.37 12/02/2016 1219   GFRNONAA >60 02/24/2022 0747   GFRNONAA >60 05/23/2021 0758   GFRAA >60 05/07/2020 0833    No results found for: "SPEP", "UPEP"  Lab Results  Component Value Date   WBC 6.0 02/24/2022   NEUTROABS 3.4 02/24/2022   HGB 14.1 02/24/2022   HCT 42.4 02/24/2022   MCV 90.4 02/24/2022   PLT 145 (L) 02/24/2022      Chemistry      Component Value Date/Time   NA 139 02/24/2022 0747   NA 141 12/02/2016 1219   K 3.9 02/24/2022 0747   K 3.7 12/02/2016 1219   CL 103 02/24/2022 0747   CL 103 01/18/2013 1329   CO2 29 02/24/2022 0747   CO2 27 12/02/2016 1219   BUN 26 (H) 02/24/2022 0747   BUN 15.3 12/02/2016 1219   CREATININE 0.71 02/24/2022 0747   CREATININE 0.71 05/23/2021 0758   CREATININE 0.7 12/02/2016  1219   GLU 236 12/13/2012 1558      Component Value Date/Time   CALCIUM 10.3 02/24/2022 0747   CALCIUM 10.1 12/02/2016 1219   ALKPHOS 75 02/24/2022 0747   ALKPHOS 104 12/02/2016 1219   AST 22 02/24/2022 0747   AST 25 05/23/2021 0758   AST 21 12/02/2016 1219   ALT 24 02/24/2022 0747   ALT 26 05/23/2021 0758   ALT 27 12/02/2016 1219   BILITOT 0.5 02/24/2022 0747   BILITOT 0.6 05/23/2021 0758   BILITOT 0.37 12/02/2016 1219

## 2022-02-24 NOTE — Addendum Note (Signed)
Addended by: Thyra Breed E on: 02/24/2022 01:24 PM   Modules accepted: Orders

## 2022-02-26 DIAGNOSIS — E782 Mixed hyperlipidemia: Secondary | ICD-10-CM | POA: Diagnosis not present

## 2022-02-26 DIAGNOSIS — Z79899 Other long term (current) drug therapy: Secondary | ICD-10-CM | POA: Diagnosis not present

## 2022-03-02 ENCOUNTER — Other Ambulatory Visit: Payer: Self-pay

## 2022-03-02 ENCOUNTER — Telehealth: Payer: Self-pay

## 2022-03-02 NOTE — Telephone Encounter (Signed)
NOTES SCANNED TO REFERRAL 

## 2022-03-03 ENCOUNTER — Telehealth: Payer: Self-pay | Admitting: Pharmacist

## 2022-03-03 ENCOUNTER — Encounter: Payer: Self-pay | Admitting: Pharmacist

## 2022-03-03 ENCOUNTER — Ambulatory Visit (INDEPENDENT_AMBULATORY_CARE_PROVIDER_SITE_OTHER): Payer: Medicare HMO | Admitting: Neurology

## 2022-03-03 ENCOUNTER — Ambulatory Visit (INDEPENDENT_AMBULATORY_CARE_PROVIDER_SITE_OTHER): Payer: Medicare HMO | Admitting: Pharmacist

## 2022-03-03 VITALS — BP 148/84 | HR 70

## 2022-03-03 DIAGNOSIS — F411 Generalized anxiety disorder: Secondary | ICD-10-CM | POA: Insufficient documentation

## 2022-03-03 DIAGNOSIS — E782 Mixed hyperlipidemia: Secondary | ICD-10-CM | POA: Diagnosis not present

## 2022-03-03 DIAGNOSIS — R530 Neoplastic (malignant) related fatigue: Secondary | ICD-10-CM

## 2022-03-03 DIAGNOSIS — I700 Atherosclerosis of aorta: Secondary | ICD-10-CM | POA: Insufficient documentation

## 2022-03-03 DIAGNOSIS — G620 Drug-induced polyneuropathy: Secondary | ICD-10-CM

## 2022-03-03 DIAGNOSIS — E559 Vitamin D deficiency, unspecified: Secondary | ICD-10-CM | POA: Insufficient documentation

## 2022-03-03 DIAGNOSIS — M85852 Other specified disorders of bone density and structure, left thigh: Secondary | ICD-10-CM | POA: Insufficient documentation

## 2022-03-03 DIAGNOSIS — T451X5A Adverse effect of antineoplastic and immunosuppressive drugs, initial encounter: Secondary | ICD-10-CM

## 2022-03-03 DIAGNOSIS — I2510 Atherosclerotic heart disease of native coronary artery without angina pectoris: Secondary | ICD-10-CM | POA: Diagnosis not present

## 2022-03-03 DIAGNOSIS — E1159 Type 2 diabetes mellitus with other circulatory complications: Secondary | ICD-10-CM

## 2022-03-03 DIAGNOSIS — M1990 Unspecified osteoarthritis, unspecified site: Secondary | ICD-10-CM | POA: Insufficient documentation

## 2022-03-03 DIAGNOSIS — F4310 Post-traumatic stress disorder, unspecified: Secondary | ICD-10-CM | POA: Insufficient documentation

## 2022-03-03 DIAGNOSIS — C786 Secondary malignant neoplasm of retroperitoneum and peritoneum: Secondary | ICD-10-CM

## 2022-03-03 DIAGNOSIS — G4733 Obstructive sleep apnea (adult) (pediatric): Secondary | ICD-10-CM

## 2022-03-03 MED ORDER — REPATHA SURECLICK 140 MG/ML ~~LOC~~ SOAJ: 1.0000 mL | SUBCUTANEOUS | 11 refills | Status: DC

## 2022-03-03 NOTE — Patient Instructions (Addendum)
It was nice meeting you today  We would like your LDL (bad cholesterol) to be less than 55  Please continue your Rosuvastatin '40mg'$  daily and Ezetimibe '10mg'$  daily  We would like to start you on a new medication called Repatha which you will inject once every 2 weeks  I will complete the prior authorization for you and contact you when it is approved  Once you start the medication we will recheck your cholesterol levels in about 2-3 months  Please call with any questions!  Karren Cobble, PharmD, BCACP, [place], [place], [place], [place], [address] Phone: [phone], Fax: [phone]

## 2022-03-03 NOTE — Progress Notes (Signed)
Patient ID: Kristin Webb                 DOB: [birth year]                    MRN: [id number]     HPI: [name] is a 76 y.o. female patient referred to lipid clinic by Dr Harl Bowie. PMH is significant for ovarian cancer treated with bavacizumab, T2DM, CAD, HTN, HLD and chronic diarrhea.    Patient presents today to discuss cholesterol management. Currently takes colestipol daily for diarrhea. Also on rosuvastatin and ezetimibe.  Last A1c 6.1.  Oncology treatment had to be delayed recently due to proteinuria.  Patient desires to be aggressive in lipid management.  Current Medications:  Colestipol 1 gram in evening, 513m in morning Rosuvastatin 478mZetia 1031mIntolerances: N/A  Risk Factors:  CAD T2DM  LDL goal: <55  Labs: TC 173, HDL 58, LDL 91, Trigs 145 (02/26/22 on rosuvastatin and Zetia)  Past Medical History:  Diagnosis Date   Burning mouth syndrome    Chronic fatigue    Constipation    Diabetes ([facility]CC[place]9/30/2019   Diarrhea    in the past after gallbladder removal   Fibromyalgia    GERD (gastroesophageal reflux disease)    History of kidney stones 07/2015   Hyperlipidemia    Hypertension    borderline not on meds    Insomnia secondary to depression with anxiety    Lumbar disc disease    Ovarian cancer ([place]/2020   met nodule in abd   Pelvic mass in female    Pneumonia    hx of pneumonia as an infant   PONV (postoperative nausea and vomiting)    pain from gas hernia 2017   Shortness of breath    with exertion    Sleep apnea    uses CPAP   Yeast infection     Current Outpatient Medications on File Prior to Visit  Medication Sig Dispense Refill   amLODipine (NORVASC) 10 MG tablet Take 1 tablet (10 mg total) by mouth daily. 30 tablet 11   bevacizumab in sodium chloride 0.9 % 100 mL Inject into the vein once.     busPIRone (BUSPAR) 15 MG tablet Take 15 mg by mouth 2 (two) times daily.     chlorthalidone (HYGROTON) 25 MG tablet Take 25 mg by mouth  daily.     cholecalciferol (VITAMIN D3) 25 MCG (1000 UT) tablet Take 1,000 Units by mouth daily.     colestipol (COLESTID) 1 g tablet Take 2 g by mouth at bedtime.     Eszopiclone 3 MG TABS Take 1 tablet (3 mg total) by mouth at bedtime. Take immediately before bedtime 30 tablet 5   ezetimibe (ZETIA) 10 MG tablet Take 10 mg by mouth once.     FIBER PO Take by mouth.     FLUoxetine (PROZAC) 20 MG capsule Take 40 mg by mouth every morning. Takes with a 46m40mpsule for her total dose of 30mg22m  lidocaine-prilocaine (EMLA) cream Apply 1 application topically as needed. 30 g 1   MELATONIN PO Take 10 mg by mouth at bedtime.      metFORMIN (GLUCOPHAGE-XR) 500 MG 24 hr tablet Take 500 mg by mouth daily with breakfast.   3   metoprolol tartrate (LOPRESSOR) 25 MG tablet Take 1 tablet (25 mg total) by mouth 2 (two) times daily. 60 tablet 3   modafinil (PROVIGIL) 200 MG tablet Take 1  tablet (200 mg total) by mouth every morning. 30 tablet 5   Multiple Vitamin (MULTIVITAMIN WITH MINERALS) TABS Take 1 tablet by mouth every morning.      rosuvastatin (CRESTOR) 40 MG tablet      telmisartan (MICARDIS) 40 MG tablet Take 40 mg by mouth daily.     Current Facility-Administered Medications on File Prior to Visit  Medication Dose Route Frequency Provider Last Rate Last Admin   sodium chloride flush (NS) 0.9 % injection 10 mL  10 mL Intravenous PRN Nancy Marus, MD   10 mL at 05/12/17 9702    Allergies  Allergen Reactions   Ambien [Zolpidem Tartrate] Other (See Comments)    Sleep walking   Bee Venom Anaphylaxis and Swelling    Difficulty breathing, carries an EPI-pen   Cortisone Nausea And Vomiting, Swelling and Other (See Comments)    INJECTED CORTISONE ONLY, "sick to my stomach, throwing up, hand swelling, and pain."    Assessment/Plan:  1. Hyperlipidemia - Patient's recent LDL 91 which is above goal of <55.  Aggressive goal chosen due to T2DM and patient preference.  Advised that since she was  currently on 3 LDL lowering medications including high intensity statin, next step would be Repatha.  Using demo pen, educated patient on mechanism of action, storage, site selection, administration, and possible adverse effects. Patient was able to demonstrate in room.  Should be no drug drug interactions between Repatha and Avastin.  Will complete PA and contact patient when approved.   Recheck lipid panel in 2-3 months.  Continue Zetia 77m daily (may be able to d/c if pt reaches goal) Continue Colestipol Continue rosuavstatin 482mdaily Check lipid panels in 2-3 months  ChKarren CobblePharmD, BCExcelsior SpringsCDDunseithCPKnightsenSuAguilarrLake [id number]: [phone]: [phone]

## 2022-03-03 NOTE — Telephone Encounter (Signed)
PA for Repatha submitted. Key [id number].  Approved through 08/09/22.

## 2022-03-05 ENCOUNTER — Encounter: Payer: Self-pay | Admitting: *Deleted

## 2022-03-05 ENCOUNTER — Telehealth: Payer: Self-pay | Admitting: *Deleted

## 2022-03-05 NOTE — Progress Notes (Signed)
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   RECOMMENDATION: continuation of CPAP therapy at auto settings. 8 through 18 cm water, 2 cm EPR and mask of her choice with heated humidification.

## 2022-03-05 NOTE — Telephone Encounter (Signed)
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   RECOMMENDATION: continuation of CPAP therapy at auto settings. 8 through 18 cm water, 2 cm EPR and mask of her choice with heated humidification.

## 2022-03-05 NOTE — Telephone Encounter (Signed)
I called Kristin Webb. I advised Kristin Webb that Dr. Brett Fairy reviewed their sleep study results and found that Kristin Webb has severe sleep apnea. Dr. Brett Fairy recommends that Kristin Webb continue CPAP and get set up with new machine. I reviewed PAP compliance expectations with the Kristin Webb. Kristin Webb is agreeable to starting a CPAP. I advised Kristin Webb that an order will be sent to a DME, [employer], and [employer] will call the Kristin Webb within about one week after they file with the Kristin Webb's insurance. [employer] will show the Kristin Webb how to use the machine, fit for masks, and troubleshoot the CPAP if needed. A follow up appt was made for insurance purposes with Amy Lomax on 04/28/22 at 2p. Kristin Webb verbalized understanding to arrive 15 minutes early and bring their CPAP. A letter with all of this information in it will be mailed to the Kristin Webb as a reminder. I verified with the Kristin Webb that the address we have on file is correct. Kristin Webb verbalized understanding of results. Kristin Webb had no questions at this time but was encouraged to call back if questions arise. I have sent the order to [employer] and have received confirmation that they have received the order.  FYI-Kristin Webb already had a f/u 04/28/22 with our office scheduled and hoping to get set up quickly in order to keep this as her intial cpap f/u. She said her schedule is pretty full and has a lot of medical appt. Hard to move things around. I did warn her we may have to push appt out depending on set up date. I let [facility] know in case they can help expedite things to get her set up.

## 2022-03-05 NOTE — Addendum Note (Signed)
Addended by: Larey Seat on: 03/05/2022 02:22 PM   Modules accepted: Orders

## 2022-03-05 NOTE — Procedures (Signed)
[facility] TEST REPORT ( by Watch PAT)   Kristin Webb Female, 76 y.o., [birth year] STUDY DATE:  03-03-2022   ORDERING CLINICIAN: Larey Seat, MD  REFERRING CLINICIAN: Referring Provider: Cari Caraway, MD at [facility]    CLINICAL INFORMATION/HISTORY: Kristin Marus, MD [facility].  Kristin Dakins, MD at [facility].  -   Review sleepiness, CPAP compliance.  interface now covers mouth and nose, in spite of easily irritated skin. Machine was set up May 2018.    Last seen 09/24/20. Here for yearly CPAP f/u. Pt reports being up a couple times at night. Taking 2 benadryl at night with her Kristin Webb, helps reduce it down to 1x.   I have the pleasure of meeting today again with Kristin Webb meanwhile 76 year old Caucasian widowed female patient whom I have followed for obstructive sleep apnea on CPAP and through stage IV cancer diagnosis with relapse.   She states that she is extremely fatigued which is more likely attributed to her neoplasm.  Sleepiness and the true sense of the word is not her problem or not as much.  She had some limitations in her physical exercise capabilities.  She is highly compliant with CPAP uses the machine over the last 30 days is 100% with an average time of 8 hours and 34 minutes, overall not but she estimates her sleep time to be less than 6 hours usually.  Her sleep is broken      Epworth sleepiness score: 12/24.   BMI: 39.6 kg/m   Neck Circumference: 15"   FINDINGS:   Sleep Summary:   Total Recording Time (hours, min):   8 h 48 m     Total Sleep Time (hours, min):    7 h 49 m             Percent REM (%):   16.4%                                     Respiratory Indices:   Calculated pAHI (per hour):   51.7/h                          REM pAHI:  63.1/h                                               NREM pAHI:  49/h                            Positional  AHI:      supine AHI 55/h and left lateral AHI 17.4/h.   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    RECOMMENDATION: continuation of CPAP therapy at auto settings. 8 through 18 cm water, 2 cm EPR and mask of her choice with heated humidification.     INTERPRETING PHYSICIAN:   Larey Seat, MD   Medical Director of [facility] Sleep at [facility].

## 2022-03-05 NOTE — Progress Notes (Signed)
[facility] TEST REPORT ( by Watch PAT)   Kristin Webb Female, 76 y.o., 12-31-1945 STUDY DATE:  03-03-2022   ORDERING CLINICIAN: Larey Seat, MD  REFERRING CLINICIAN: Referring Provider: Cari Caraway, MD at [facility].    CLINICAL INFORMATION/HISTORY: Kristin Marus, MD [facility].  Kristin Dakins, MD at [facility].  -   Review sleepiness, CPAP compliance.  interface now covers mouth and nose, in spite of easily irritated skin. Machine was set up May 2018.    Last seen 09/24/20. Here for yearly CPAP f/u. Pt reports being up a couple times at night. Taking 2 benadryl at night with her Kristin Webb, helps reduce it down to 1x.   I have the pleasure of meeting today again with Kristin Webb meanwhile 76 year old Caucasian widowed female patient whom I have followed for obstructive sleep apnea on CPAP and through stage IV cancer diagnosis with relapse.   She states that she is extremely fatigued which is more likely attributed to her neoplasm.  Sleepiness and the true sense of the word is not her problem or not as much.  She had some limitations in her physical exercise capabilities.  She is highly compliant with CPAP uses the machine over the last 30 days is 100% with an average time of 8 hours and 34 minutes, overall not but she estimates her sleep time to be less than 6 hours usually.  Her sleep is broken      Epworth sleepiness score: 12/24.   BMI: 39.6 kg/m   Neck Circumference: 15"   FINDINGS:   Sleep Summary:   Total Recording Time (hours, min):   8 h 48 m     Total Sleep Time (hours, min):    7 h 49 m             Percent REM (%):   16.4%                                     Respiratory Indices:   Calculated pAHI (per hour):   51.7/h                          REM pAHI:  63.1/h                                               NREM pAHI:  49/h                            Positional  AHI:      supine AHI 55/h and left lateral AHI  17.4/h.  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    RECOMMENDATION: continuation of CPAP therapy at auto settings. 8 through 18 cm water, 2 cm EPR and mask of her choice with heated humidification.     INTERPRETING PHYSICIAN:   Larey Seat, MD   Medical Director of [facility] at [facility].

## 2022-03-16 DIAGNOSIS — Z6838 Body mass index (BMI) 38.0-38.9, adult: Secondary | ICD-10-CM | POA: Diagnosis not present

## 2022-03-16 DIAGNOSIS — I2510 Atherosclerotic heart disease of native coronary artery without angina pectoris: Secondary | ICD-10-CM | POA: Diagnosis not present

## 2022-03-16 DIAGNOSIS — E1159 Type 2 diabetes mellitus with other circulatory complications: Secondary | ICD-10-CM | POA: Diagnosis not present

## 2022-03-16 DIAGNOSIS — K529 Noninfective gastroenteritis and colitis, unspecified: Secondary | ICD-10-CM | POA: Diagnosis not present

## 2022-03-16 DIAGNOSIS — E782 Mixed hyperlipidemia: Secondary | ICD-10-CM | POA: Diagnosis not present

## 2022-03-18 ENCOUNTER — Inpatient Hospital Stay: Payer: Medicare HMO

## 2022-03-18 ENCOUNTER — Other Ambulatory Visit: Payer: Self-pay

## 2022-03-18 ENCOUNTER — Inpatient Hospital Stay: Payer: Medicare HMO | Attending: Gynecologic Oncology

## 2022-03-18 VITALS — BP 138/69 | HR 70 | Temp 98.1°F | Resp 18 | Ht 59.0 in | Wt 189.6 lb

## 2022-03-18 DIAGNOSIS — D696 Thrombocytopenia, unspecified: Secondary | ICD-10-CM | POA: Insufficient documentation

## 2022-03-18 DIAGNOSIS — Z5111 Encounter for antineoplastic chemotherapy: Secondary | ICD-10-CM | POA: Diagnosis not present

## 2022-03-18 DIAGNOSIS — Z7189 Other specified counseling: Secondary | ICD-10-CM

## 2022-03-18 DIAGNOSIS — C5701 Malignant neoplasm of right fallopian tube: Secondary | ICD-10-CM

## 2022-03-18 DIAGNOSIS — C5700 Malignant neoplasm of unspecified fallopian tube: Secondary | ICD-10-CM

## 2022-03-18 DIAGNOSIS — I10 Essential (primary) hypertension: Secondary | ICD-10-CM

## 2022-03-18 LAB — CBC WITH DIFFERENTIAL/PLATELET
Abs Immature Granulocytes: 0.01 10*3/uL (ref 0.00–0.07)
Basophils Absolute: 0 10*3/uL (ref 0.0–0.1)
Basophils Relative: 1 %
Eosinophils Absolute: 0.1 10*3/uL (ref 0.0–0.5)
Eosinophils Relative: 2 %
HCT: 40 % (ref 36.0–46.0)
Hemoglobin: 13.6 g/dL (ref 12.0–15.0)
Immature Granulocytes: 0 %
Lymphocytes Relative: 32 %
Lymphs Abs: 1.5 10*3/uL (ref 0.7–4.0)
MCH: 30.3 pg (ref 26.0–34.0)
MCHC: 34 g/dL (ref 30.0–36.0)
MCV: 89.1 fL (ref 80.0–100.0)
Monocytes Absolute: 0.5 10*3/uL (ref 0.1–1.0)
Monocytes Relative: 11 %
Neutro Abs: 2.6 10*3/uL (ref 1.7–7.7)
Neutrophils Relative %: 54 %
Platelets: 134 10*3/uL — ABNORMAL LOW (ref 150–400)
RBC: 4.49 MIL/uL (ref 3.87–5.11)
RDW: 13.5 % (ref 11.5–15.5)
WBC: 4.7 10*3/uL (ref 4.0–10.5)
nRBC: 0 % (ref 0.0–0.2)

## 2022-03-18 LAB — COMPREHENSIVE METABOLIC PANEL
ALT: 21 U/L (ref 0–44)
AST: 23 U/L (ref 15–41)
Albumin: 4.3 g/dL (ref 3.5–5.0)
Alkaline Phosphatase: 62 U/L (ref 38–126)
Anion gap: 6 (ref 5–15)
BUN: 21 mg/dL (ref 8–23)
CO2: 29 mmol/L (ref 22–32)
Calcium: 9.5 mg/dL (ref 8.9–10.3)
Chloride: 104 mmol/L (ref 98–111)
Creatinine, Ser: 0.82 mg/dL (ref 0.44–1.00)
GFR, Estimated: >60 mL/min (ref >60)
Glucose, Bld: 106 mg/dL — ABNORMAL HIGH (ref 70–99)
Potassium: 4.1 mmol/L (ref 3.5–5.1)
Sodium: 139 mmol/L (ref 135–145)
Total Bilirubin: 0.5 mg/dL (ref 0.3–1.2)
Total Protein: 6.9 g/dL (ref 6.5–8.1)

## 2022-03-18 LAB — TOTAL PROTEIN, URINE DIPSTICK: Protein, ur: NEGATIVE mg/dL

## 2022-03-18 MED ORDER — SODIUM CHLORIDE 0.9% FLUSH
10.0000 mL | INTRAVENOUS | Status: DC | PRN
  Administered 2022-03-18: 10 mL

## 2022-03-18 MED ORDER — SODIUM CHLORIDE 0.9% FLUSH
10.0000 mL | Freq: Once | INTRAVENOUS | Status: AC
  Administered 2022-03-18: 10 mL

## 2022-03-18 MED ORDER — SODIUM CHLORIDE 0.9 % IV SOLN: Freq: Once | INTRAVENOUS | Status: AC

## 2022-03-18 MED ORDER — HEPARIN SOD (PORK) LOCK FLUSH 100 UNIT/ML IV SOLN
500.0000 [IU] | Freq: Once | INTRAVENOUS | Status: AC | PRN
  Administered 2022-03-18: 500 [IU]

## 2022-03-18 MED ORDER — SODIUM CHLORIDE 0.9 % IV SOLN
7.5000 mg/kg | Freq: Once | INTRAVENOUS | Status: AC
  Administered 2022-03-18: 700 mg via INTRAVENOUS
  Filled 2022-03-18: qty 16

## 2022-03-18 NOTE — Patient Instructions (Signed)
[facility] MEDICAL ONCOLOGY  Discharge Instructions: Thank you for choosing [facility] to provide your oncology and hematology care.   If you have a lab appointment with the Cancer Center, please go directly to the Cancer Center and check in at the registration area.   Wear comfortable clothing and clothing appropriate for easy access to any Portacath or PICC line.   We strive to give you quality time with your provider. You may need to reschedule your appointment if you arrive late (15 or more minutes).  Arriving late affects you and other patients whose appointments are after yours.  Also, if you miss three or more appointments without notifying the office, you may be dismissed from the clinic at the provider's discretion.      For prescription refill requests, have your pharmacy contact our office and allow 72 hours for refills to be completed.    Today you received the following chemotherapy and/or immunotherapy agents: Bevacizumab      To help prevent nausea and vomiting after your treatment, we encourage you to take your nausea medication as directed.  BELOW ARE SYMPTOMS THAT SHOULD BE REPORTED IMMEDIATELY: *FEVER GREATER THAN 100.4 F (38 C) OR HIGHER *CHILLS OR SWEATING *NAUSEA AND VOMITING THAT IS NOT CONTROLLED WITH YOUR NAUSEA MEDICATION *UNUSUAL SHORTNESS OF BREATH *UNUSUAL BRUISING OR BLEEDING *URINARY PROBLEMS (pain or burning when urinating, or frequent urination) *BOWEL PROBLEMS (unusual diarrhea, constipation, pain near the anus) TENDERNESS IN MOUTH AND THROAT WITH OR WITHOUT PRESENCE OF ULCERS (sore throat, sores in mouth, or a toothache) UNUSUAL RASH, SWELLING OR PAIN  UNUSUAL VAGINAL DISCHARGE OR ITCHING   Items with * indicate a potential emergency and should be followed up as soon as possible or go to the Emergency Department if any problems should occur.  Please show the CHEMOTHERAPY ALERT CARD or IMMUNOTHERAPY ALERT CARD at check-in  to the Emergency Department and triage nurse.  Should you have questions after your visit or need to cancel or reschedule your appointment, please contact [facility] MEDICAL ONCOLOGY  Dept: [phone]  and follow the prompts.  Office hours are 8:00 a.m. to 4:30 p.m. Monday - Friday. Please note that voicemails left after 4:00 p.m. may not be returned until the following business day.  We are closed weekends and major holidays. You have access to a nurse at all times for urgent questions. Please call the main number to the clinic Dept: [phone] and follow the prompts.   For any non-urgent questions, you may also contact your provider using MyChart. We now offer e-Visits for anyone 18 and older to request care online for non-urgent symptoms. For details visit [email or web address].   Also download the MyChart app! Go to the app store, search "MyChart", open the app, select [facility], and log in with your MyChart username and password.  Masks are optional in the cancer centers. If you would like for your care team to wear a mask while they are taking care of you, please let them know. You may have one support person who is at least 75 years old accompany you for your appointments. 

## 2022-03-26 DIAGNOSIS — M7989 Other specified soft tissue disorders: Secondary | ICD-10-CM | POA: Diagnosis not present

## 2022-03-26 DIAGNOSIS — M79644 Pain in right finger(s): Secondary | ICD-10-CM | POA: Diagnosis not present

## 2022-04-02 DIAGNOSIS — M79644 Pain in right finger(s): Secondary | ICD-10-CM | POA: Diagnosis not present

## 2022-04-07 ENCOUNTER — Inpatient Hospital Stay (HOSPITAL_BASED_OUTPATIENT_CLINIC_OR_DEPARTMENT_OTHER): Payer: Medicare HMO | Admitting: Hematology and Oncology

## 2022-04-07 ENCOUNTER — Other Ambulatory Visit: Payer: Self-pay

## 2022-04-07 ENCOUNTER — Encounter: Payer: Self-pay | Admitting: Hematology and Oncology

## 2022-04-07 ENCOUNTER — Inpatient Hospital Stay: Payer: Medicare HMO

## 2022-04-07 VITALS — BP 130/88 | HR 67 | Temp 97.4°F | Resp 18 | Ht 59.0 in | Wt 190.8 lb

## 2022-04-07 DIAGNOSIS — C5701 Malignant neoplasm of right fallopian tube: Secondary | ICD-10-CM

## 2022-04-07 DIAGNOSIS — Z7189 Other specified counseling: Secondary | ICD-10-CM

## 2022-04-07 DIAGNOSIS — I10 Essential (primary) hypertension: Secondary | ICD-10-CM

## 2022-04-07 DIAGNOSIS — Z5111 Encounter for antineoplastic chemotherapy: Secondary | ICD-10-CM

## 2022-04-07 DIAGNOSIS — C5700 Malignant neoplasm of unspecified fallopian tube: Secondary | ICD-10-CM | POA: Diagnosis not present

## 2022-04-07 DIAGNOSIS — D696 Thrombocytopenia, unspecified: Secondary | ICD-10-CM

## 2022-04-07 LAB — CBC WITH DIFFERENTIAL/PLATELET
Abs Immature Granulocytes: 0.01 10*3/uL (ref 0.00–0.07)
Basophils Absolute: 0 10*3/uL (ref 0.0–0.1)
Basophils Relative: 0 %
Eosinophils Absolute: 0.1 10*3/uL (ref 0.0–0.5)
Eosinophils Relative: 2 %
HCT: 41.6 % (ref 36.0–46.0)
Hemoglobin: 14 g/dL (ref 12.0–15.0)
Immature Granulocytes: 0 %
Lymphocytes Relative: 29 %
Lymphs Abs: 1.5 10*3/uL (ref 0.7–4.0)
MCH: 29.8 pg (ref 26.0–34.0)
MCHC: 33.7 g/dL (ref 30.0–36.0)
MCV: 88.5 fL (ref 80.0–100.0)
Monocytes Absolute: 0.5 10*3/uL (ref 0.1–1.0)
Monocytes Relative: 9 %
Neutro Abs: 3.1 10*3/uL (ref 1.7–7.7)
Neutrophils Relative %: 60 %
Platelets: 131 10*3/uL — ABNORMAL LOW (ref 150–400)
RBC: 4.7 MIL/uL (ref 3.87–5.11)
RDW: 13.6 % (ref 11.5–15.5)
WBC: 5.2 10*3/uL (ref 4.0–10.5)
nRBC: 0 % (ref 0.0–0.2)

## 2022-04-07 LAB — COMPREHENSIVE METABOLIC PANEL
ALT: 34 U/L (ref 0–44)
AST: 33 U/L (ref 15–41)
Albumin: 4.4 g/dL (ref 3.5–5.0)
Alkaline Phosphatase: 70 U/L (ref 38–126)
Anion gap: 5 (ref 5–15)
BUN: 17 mg/dL (ref 8–23)
CO2: 30 mmol/L (ref 22–32)
Calcium: 10.1 mg/dL (ref 8.9–10.3)
Chloride: 104 mmol/L (ref 98–111)
Creatinine, Ser: 0.65 mg/dL (ref 0.44–1.00)
GFR, Estimated: >60 mL/min (ref >60)
Glucose, Bld: 126 mg/dL — ABNORMAL HIGH (ref 70–99)
Potassium: 3.8 mmol/L (ref 3.5–5.1)
Sodium: 139 mmol/L (ref 135–145)
Total Bilirubin: 0.6 mg/dL (ref 0.3–1.2)
Total Protein: 6.8 g/dL (ref 6.5–8.1)

## 2022-04-07 LAB — TOTAL PROTEIN, URINE DIPSTICK: Protein, ur: 30 mg/dL — AB

## 2022-04-07 MED ORDER — SODIUM CHLORIDE 0.9 % IV SOLN: Freq: Once | INTRAVENOUS | Status: AC

## 2022-04-07 MED ORDER — SODIUM CHLORIDE 0.9 % IV SOLN
7.5000 mg/kg | Freq: Once | INTRAVENOUS | Status: AC
  Administered 2022-04-07: 700 mg via INTRAVENOUS
  Filled 2022-04-07: qty 16

## 2022-04-07 MED ORDER — SODIUM CHLORIDE 0.9% FLUSH
10.0000 mL | Freq: Once | INTRAVENOUS | Status: AC
  Administered 2022-04-07: 10 mL

## 2022-04-07 NOTE — Assessment & Plan Note (Signed)
We reviewed the plan of care The patient has complete response to treatment as documented by CT imaging over the last 3 years She has experienced some side effects with intermittent proteinuria We discussed the possibility of discontinuation of treatment and just follow clinically with active surveillance imaging Overall, I plan to proceed with treatment as scheduled today and will order CT imaging to be done next week

## 2022-04-07 NOTE — Assessment & Plan Note (Signed)
According to the patient, her blood pressure at home were within normal range but urinalysis revealed proteinuria She will continue risk factor modification

## 2022-04-07 NOTE — Progress Notes (Signed)
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[facility]) We reviewed the plan of care The patient has complete response to treatment as documented by CT imaging over the last 3 years She has experienced some side effects with intermittent proteinuria We discussed the possibility of discontinuation of treatment and just follow clinically with active surveillance imaging Overall, I plan to proceed with treatment as scheduled today and will order CT imaging to be done next week  Thrombocytopenia ([place]) Her thrombocytopenia is stable Observe only for now  Essential hypertension According to the patient, her blood pressure at home were within normal range but urinalysis revealed proteinuria She will continue risk factor modification  Orders Placed This Encounter  Procedures   CT ABDOMEN PELVIS W CONTRAST    Standing Status:   Future    Standing Expiration Date:   04/08/2023    Order Specific Question:   If indicated for the ordered procedure, I authorize the administration of contrast media per Radiology protocol    Answer:   Yes    Order Specific Question:   Preferred imaging location?    Answer:   [facility]    Order Specific Question:   Radiology Contrast Protocol - do NOT remove file path    Answer:   [email or web address]    All questions were answered. The patient knows to call the clinic with any problems, questions or concerns. The total time spent in the appointment was 30 minutes encounter with patients including review of chart and various tests results, discussions about plan of care and coordination of care plan   [name], MD 04/07/2022 12:45 PM  INTERVAL HISTORY: Please see below for problem  oriented charting. she returns for treatment follow-up with her friend We discussed the role of continuing treatment versus stopping treatment She is not symptomatic Her blood pressure from home were within normal range  REVIEW OF SYSTEMS:   Constitutional: Denies fevers, chills or abnormal weight loss Eyes: Denies blurriness of vision Ears, nose, mouth, throat, and face: Denies mucositis or sore throat Respiratory: Denies cough, dyspnea or wheezes Cardiovascular: Denies palpitation, chest discomfort or lower extremity swelling Gastrointestinal:  Denies nausea, heartburn or change in bowel habits Skin: Denies abnormal skin rashes Lymphatics: Denies new lymphadenopathy or easy bruising Neurological:Denies numbness, tingling or new weaknesses Behavioral/Psych: Mood is stable, no new changes  All other systems were reviewed with the patient and are negative.  I have reviewed the past medical history, past surgical history, social history and family history with the patient and they are unchanged from previous note.  ALLERGIES:  is allergic to [employer] tartrate], bee venom, and cortisone.  MEDICATIONS:  Current Outpatient Medications  Medication Sig Dispense Refill   amLODipine (NORVASC) 10 MG tablet Take 1 tablet (10 mg total) by mouth daily. 30 tablet 11   bevacizumab in sodium chloride 0.9 % 100 mL Inject into the vein once.     busPIRone (BUSPAR) 15 MG tablet Take 15 mg by mouth 2 (two) times daily.     chlorthalidone (HYGROTON) 25 MG tablet Take 25 mg by mouth daily.     cholecalciferol (VITAMIN D3) 25 MCG (1000 UT) tablet Take 1,000 Units by mouth daily.     colestipol (COLESTID) 1 g tablet Take 2 g by mouth at  bedtime. 1 tablet in the evening, 1/2 tablet in the morning     Eszopiclone 3 MG TABS Take 1 tablet (3 mg total) by mouth at bedtime. Take immediately before bedtime 30 tablet 5   Evolocumab (REPATHA SURECLICK) 413 MG/ML SOAJ Inject 1 mL into the skin every 14  (fourteen) days. 2 mL 11   ezetimibe (ZETIA) 10 MG tablet Take 10 mg by mouth once.     FIBER PO Take by mouth.     FLUoxetine (PROZAC) 20 MG capsule Take 40 mg by mouth every morning. Takes with a 70m capsule for her total dose of 339m     lidocaine-prilocaine (EMLA) cream Apply 1 application topically as needed. 30 g 1   MELATONIN PO Take 10 mg by mouth at bedtime.      metFORMIN (GLUCOPHAGE-XR) 500 MG 24 hr tablet Take 500 mg by mouth daily with breakfast.   3   metoprolol tartrate (LOPRESSOR) 25 MG tablet Take 1 tablet (25 mg total) by mouth 2 (two) times daily. 60 tablet 3   modafinil (PROVIGIL) 200 MG tablet Take 1 tablet (200 mg total) by mouth every morning. 30 tablet 5   Multiple Vitamin (MULTIVITAMIN WITH MINERALS) TABS Take 1 tablet by mouth every morning.      rosuvastatin (CRESTOR) 40 MG tablet      telmisartan (MICARDIS) 40 MG tablet Take 40 mg by mouth daily.     No current facility-administered medications for this visit.   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  Interval development of large mid abdominal mass highly concerning  for right ovarian cancer.  There is mild omental nodularity on the left, and peritoneal disease cannot be completely excluded.  There is no ascites or other evidence of metastatic disease. 2.  Mild associated renal pelvocaliectasis bilaterally without obstruction.  Nonobstructing left renal calculus and a small right renal angiomyolipoma noted incidentally.   11/08/2012 Pathology Results   1. 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 - BENIGN FALLOPIAN TUBE WITH NONSPECIFIC CHRONIC INFLAMMATION. 2. Ovary and fallopian tube, left - BENIGN OVARY; NEGATIVE FOR ATYPIA OR MALIGNANCY. - BENIGN FALLOPIAN TUBE; NEGATIVE FOR ATYPIA OR MALIGNANCY. 3. Omentum, resection for tumor - HIGH GRADE CARCINOMA, SEE COMMENT. 4. Appendix, Other than Incidental - FIBROUS OBLITERATION OF APPENDICEAL TIP. - NEGATIVE FOR MALIGNANCY.   11/08/2012 Surgery   Surgery: Exploratory laparotomy, bilateral salpingo-oophorectomy, appendectomy, infacolic omentectomy, optimal debulking   Surgeons:  Paola A. GeAlycia RossettiMD; LiLahoma CrockerMD    Assistant: NaCaswell CorwinPathology: Bilateral fallopian tubes and ovaries to pathology. Appendix as well as omentum. Frozen section of  the right ovary revealed at least a mucinous low malignant potential or borderline tumor of the ovary.   Operative findings: 25 cm right adnexal mass with smooth surface. Surgically absent uterus. Atrophic-appearing left ovary. Normal appearing appendix. Within the omentum there were centimeter nodules scattered throughout the omentum. The remainder of the surfaces were benign.   12/08/2012 Procedure   Impression:  Placement of a subcutaneous port device.  The catheter tip is in the lower SVC and ready to be used.     12/13/2012 - 03/28/2013 Chemotherapy   s/p 6 cycles of paclitaxel and carboplatin    12/13/2012 - 03/28/2013 Chemotherapy   The patient had 6 cycles of carboplatin and Taxol   03/24/2013 Imaging   US abdomen   04/24/2013 Imaging   CT abdomen Interval resection of the large right pelvic and lower abdominal mass lesion with apparent omentectomy.  No evidence for intraperitoneal free fluid on today's study.  No discernible peritoneal lesions.   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   09/07/2013 Genetic Testing   Patient has genetic testing done for BRCA1/2 panel Results revealed patient has no mutation(s):   07/09/2015 Imaging   CT abdomen 1. 12 mm obstructive calculus at the left ureteropelvic junction with moderate proximal hydronephrosis. 2. 2 small supraumbilical ventral hernias, one containing a short segment of the mid transverse colon and the other containing a short segment of the mid small bowel. There is no associated evidence to suggest bowel incarceration or obstruction at this time. 3. Tiny locule of gas non dependently in the lumen of the urinary bladder. This is presumably iatrogenic related to recent catheterization for urinalysis. Alternatively, this could be seen in the setting of urinary tract infection with gas-forming organisms. Clinical correlation for history of recent catheterization is recommended. 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 5. Status post cholecystectomy. 6. Additional incidental findings, as above.     12/11/2016 Imaging   Ct abdomen 1. No evidence of metastatic ovarian cancer. 2. Recurrent subxiphoid ventral abdominal wall hernia containing transverse colon. No evidence of incarceration or obstruction. 3. Stable incidental findings in the liver and kidneys. No recurrent urinary tract calculus. 4. Progressive lower lumbar spondylosis. 5.  Aortic Atherosclerosis (ICD10-I70.0).     08/30/2017 Imaging   MRI thoracic spine 1. At T5-6 there is a small central disc protrusion contacting the ventral thoracic  spinal cord. No central canal or foraminal stenosis. 2. At T9-10 there is a small right paracentral disc protrusion. 3.  No acute osseous injury of the thoracic spine. 4. No aggressive osseous lesion to suggest metastatic disease.   09/24/2017 Tumor Marker   Patient's tumor was tested for the following markers: CA-125 Results of the tumor marker test revealed 21.7   09/30/2017 Imaging   CT abdomen 1. New small clustered soft tissue nodules in the left lower quadrant in the sigmoid mesentery, largest 1.0 cm, which could represent recurrent peritoneal tumor implants. No ascites.  2. Midline high ventral abdominal wall hernia containing a portion of the transverse colon is mildly increased in size, and without bowel complication at this time. 3. Chronic findings include: Aortic Atherosclerosis (ICD10-I70.0). Diffuse hepatic steatosis. 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   10/11/2017 PET scan   1. Nodules in the sigmoid mesentery are hypermetabolic and highly worrisome for metastatic disease. 2. Attic steatosis.     11/03/2017 Procedure   Successful CT-guided rectus abdominal muscle mass core biopsy. Path: -  FOREIGN BODY GIANT CELL REACTION INVOLVING FIBROADIPOSE TISSUE AND SKELETAL MUSCLE. - NO EVIDENCE OF MALIGNANCY.   11/04/2017 Cancer Staging   Staging form: Fallopian Tube, AJCC 7th Edition - Clinical: FIGO Stage IIIC, calculated as Stage IV (rT3, N0, M1) - Signed by [name], MD on 08/09/2019   02/2018 Imaging   CT: 1. Continued increase in size small peritoneal nodules along the mesenteric border of the proximal sigmoid colon as well as along the serosal surface of the proximal sigmoid colon. 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 2. No evidence of distant disease. 3. Stable large ventral hernia.   05/2018 Imaging   PET: 1. Redemonstration of hypermetabolic nodules within the sigmoid  mesocolon. Mild response to therapy relative to CT of 02/24/2018. Mixed response to therapy compared to the most recent PET of 10/11/2017. 2. Hypermetabolism within the right pelvic rectus musculature, increased since the prior PET.  Clinical service requested comparison to the 11/24/2017 CT. Index 10 mm nodule within the sigmoid mesocolon was similar to the 02/24/2018 CT, and as described on that exam, increased from 7 mm on 11/24/2017. More inferior nodule within the mesocolon measures 12 mm today on image 156/4 and 8 mm on 11/24/2017.   05/2018 Imaging   CT: IMPRESSION: 1. Since 02/24/2018, decreased size of peritoneal nodules centered in the sigmoid mesocolon. 2. No evidence of new or progressive disease. 3. Hepatic steatosis. 4. Subcentimeter right renal angiomyolipoma, similar. 5.  Aortic Atherosclerosis (ICD10-I70.0). 6. Ventral abdominal wall laxity containing transverse colon, similar.   08/2018 Imaging   CT: IMPRESSION: 1. Nodules within the sigmoid mesocolon have decreased in size compared to prior. No new peritoneal or omental nodularity. 2. No evidence local recurrence the pelvis. 3. Ventral hernia contains a segment of transverse colon. No change from prior.     06/19/2019 Imaging   1. No evidence of metastatic disease in the abdomen pelvis. 2. No peritoneal or omental metastasis identified.  No free fluid. 3. Postcholecystectomy and hysterectomy. Comparison exams are made available. Comparison CT 09/08/2018 and 05/27/2018. PET-CT 05/27/2018    There is a nodule within the proximal aspect of the sigmoid colon measuring 2.2 by 2.2 cm. In comparison to prior CTs and PET-CT there was a hypermetabolic nodule at this location on the PET-CT of 08/27/2017 and on the CT of 09/08/2018 there was a small residual nodule. At that time (09/08/2018) the nodule measured 1.4 by 1.3 cm. Therefore this nodule has increased in the interval and concerning for recurrence of a serosal implant.  The previous described lymph nodes in the sigmoid mesocolon and more central mesentery are not increased in size and not pathologic by size criteria.    Concern for recurrence of serosal metastasis in the proximal sigmoid colon with a 2 cm enlarging lesion. Lesion extends into the lumen. No evidence of high-grade obstruction. Consider FDG PET scan and/or potential colonoscopy for evaluation.   06/29/2019 PET scan   1. Enlarging serosal implant within the proximal sigmoid colon has intense metabolic activity most consistent with malignancy. Lesion exhibits a somewhat indolent progression as present on PET-CT scan from 10/11/2017 and 05/27/2018. 2. Focal hypermetabolic activity within the RIGHT rectus muscle just off midline is slightly decreased from comparison exam. This may be benign inflammation related to prior laparotomy, however malignancy not excluded.   07/26/2019 Procedure   She had colonoscopy which showed multiple polyps.  6 polyps were removed from the cecum, measuring 3 to 6 mm in size.  One 12 mm polyp  was removed from ascending colon and another 1 measures 7 mm.  One 5 mm polyp was removed from the transverse colon.  There is tumor noted in the sigmoid colon, approximately 35 cm from the anus which was biopsied.  The tumor appeared to be fungating, infiltrative and ulcerated but nonobstructive.  It encompassed approximately one third of the circumference of the lumen.   07/26/2019 Pathology Results   Multiple polyps came back tubular adenomas.  2 ascending polyps came back sessile serrated adenoma months.  Sigmoid colon biopsy confirmed metastatic high-grade serous carcinoma.  The morphology and immunophenotype are consistent with metastatic high-grade serous carcinoma from tubal/ovarian primary site.   08/17/2019 - 12/12/2019 Chemotherapy   The patient had carboplatin and taxol   10/23/2019 Imaging   1. Sigmoid lesion with diminished size/conspicuity difficult to measure on the previous  exam. Adjacent lymph nodes and nodularity less than a cm, largest on coronal image 48 measuring 5 mm. 2. Right rectus muscle with some thickening with mildly convex margin seen posteriorly on image 72 of series 2. This could be due to postsurgical change, however, metastatic disease is not excluded. Attention on follow-up is suggested. 3. No evidence for new metastatic disease. 4. Small hiatal hernia. 5. 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   Aortic Atherosclerosis (ICD10-I70.0).   01/01/2020 Imaging   1. No signs of new disease. 2. Tiny lymph nodes in the area of concern in the LEFT lower quadrant z. 3. Added density and subtle contour irregularity involving the rectus muscles best seen on sagittal images, associated with site of prior surgical incision just to the RIGHT of midline (image 72, series 2 and image 58, series 5. Not significantly changed compared to prior studies. This measures approximately 2.1 x 1 cm in the sagittal plane and is less well-defined in the axial plane. Potentially postoperative change. Attention on follow-up. 4. Aortic atherosclerosis.   Aortic Atherosclerosis (ICD10-I70.0).       01/29/2020 -  Chemotherapy   Patient is on Treatment Plan : Ovarian Bevacizumab     05/06/2020 Imaging   1. Status post hysterectomy and oophorectomy. 2. No specific findings of recurrent or metastatic disease in the abdomen or pelvis. 3. No change in postoperative appearance of low midline incision. 4. Unchanged small prominent subcentimeter left iliac lymph nodes.   10/22/2020 Imaging   1. Status post hysterectomy and oophorectomy. No evidence of recurrent or metastatic disease within the abdomen or pelvis. 2. No significant change in the subcentimeter prominent left iliac lymph nodes.   04/11/2021 Imaging   1. No findings to suggest recurrent or metastatic disease in the abdomen or pelvis. 2. Aortic atherosclerosis, in addition to at least left anterior descending coronary  artery disease. Assessment for potential risk factor modification, dietary therapy or pharmacologic therapy may be warranted, if clinically indicated. 3. Additional incidental findings, as above.   10/15/2021 Imaging   Stable exam. No evidence of recurrent or metastatic carcinoma within the abdomen or pelvis.   Stable tiny benign right renal angiomyolipoma.   Aortic Atherosclerosis (ICD10-I70.0).       PHYSICAL EXAMINATION: ECOG PERFORMANCE STATUS: 1 - Symptomatic but completely ambulatory  Vitals:   04/07/22 0830  BP: 130/88  Pulse: 67  Resp: 18  Temp: (!) 97.4 F (36.3 C)  SpO2: 100%   Filed Weights   04/07/22 0830  Weight: 190 lb 12.8 oz (86.5 kg)    GENERAL:alert, no distress and comfortable NEURO: alert & oriented x 3 with fluent speech, no focal  motor/sensory deficits  LABORATORY DATA:  I have reviewed the data as listed    Component Value Date/Time   NA 139 04/07/2022 0803   NA 141 12/02/2016 1219   K 3.8 04/07/2022 0803   K 3.7 12/02/2016 1219   CL 104 04/07/2022 0803   CL 103 01/18/2013 1329   CO2 30 04/07/2022 0803   CO2 27 12/02/2016 1219   GLUCOSE 126 (H) 04/07/2022 0803   GLUCOSE 98 12/02/2016 1219   GLUCOSE 110 (H) 01/18/2013 1329   BUN 17 04/07/2022 0803   BUN 15.3 12/02/2016 1219   CREATININE 0.65 04/07/2022 0803   CREATININE 0.71 05/23/2021 0758   CREATININE 0.7 12/02/2016 1219   CALCIUM 10.1 04/07/2022 0803   CALCIUM 10.1 12/02/2016 1219   PROT 6.8 04/07/2022 0803   PROT 7.1 12/02/2016 1219   ALBUMIN 4.4 04/07/2022 0803   ALBUMIN 4.2 12/02/2016 1219   AST 33 04/07/2022 0803   AST 25 05/23/2021 0758   AST 21 12/02/2016 1219   ALT 34 04/07/2022 0803   ALT 26 05/23/2021 0758   ALT 27 12/02/2016 1219   ALKPHOS 70 04/07/2022 0803   ALKPHOS 104 12/02/2016 1219   BILITOT 0.6 04/07/2022 0803   BILITOT 0.6 05/23/2021 0758   BILITOT 0.37 12/02/2016 1219   GFRNONAA >60 04/07/2022 0803   GFRNONAA >60 05/23/2021 0758   GFRAA >60 05/07/2020  0833    No results found for: "SPEP", "UPEP"  Lab Results  Component Value Date   WBC 5.2 04/07/2022   NEUTROABS 3.1 04/07/2022   HGB 14.0 04/07/2022   HCT 41.6 04/07/2022   MCV 88.5 04/07/2022   PLT 131 (L) 04/07/2022      Chemistry      Component Value Date/Time   NA 139 04/07/2022 0803   NA 141 12/02/2016 1219   K 3.8 04/07/2022 0803   K 3.7 12/02/2016 1219   CL 104 04/07/2022 0803   CL 103 01/18/2013 1329   CO2 30 04/07/2022 0803   CO2 27 12/02/2016 1219   BUN 17 04/07/2022 0803   BUN 15.3 12/02/2016 1219   CREATININE 0.65 04/07/2022 0803   CREATININE 0.71 05/23/2021 0758   CREATININE 0.7 12/02/2016 1219   GLU 236 12/13/2012 1558      Component Value Date/Time   CALCIUM 10.1 04/07/2022 0803   CALCIUM 10.1 12/02/2016 1219   ALKPHOS 70 04/07/2022 0803   ALKPHOS 104 12/02/2016 1219   AST 33 04/07/2022 0803   AST 25 05/23/2021 0758   AST 21 12/02/2016 1219   ALT 34 04/07/2022 0803   ALT 26 05/23/2021 0758   ALT 27 12/02/2016 1219   BILITOT 0.6 04/07/2022 0803   BILITOT 0.6 05/23/2021 0758   BILITOT 0.37 12/02/2016 1219

## 2022-04-07 NOTE — Assessment & Plan Note (Signed)
Her thrombocytopenia is stable Observe only for now

## 2022-04-07 NOTE — Patient Instructions (Signed)
[facility] MEDICAL ONCOLOGY  Discharge Instructions: Thank you for choosing [facility] to provide your oncology and hematology care.   If you have a lab appointment with the Cancer Center, please go directly to the Cancer Center and check in at the registration area.   Wear comfortable clothing and clothing appropriate for easy access to any Portacath or PICC line.   We strive to give you quality time with your provider. You may need to reschedule your appointment if you arrive late (15 or more minutes).  Arriving late affects you and other patients whose appointments are after yours.  Also, if you miss three or more appointments without notifying the office, you may be dismissed from the clinic at the provider's discretion.      For prescription refill requests, have your pharmacy contact our office and allow 72 hours for refills to be completed.    Today you received the following chemotherapy and/or immunotherapy agents: Bevacizumab      To help prevent nausea and vomiting after your treatment, we encourage you to take your nausea medication as directed.  BELOW ARE SYMPTOMS THAT SHOULD BE REPORTED IMMEDIATELY: *FEVER GREATER THAN 100.4 F (38 C) OR HIGHER *CHILLS OR SWEATING *NAUSEA AND VOMITING THAT IS NOT CONTROLLED WITH YOUR NAUSEA MEDICATION *UNUSUAL SHORTNESS OF BREATH *UNUSUAL BRUISING OR BLEEDING *URINARY PROBLEMS (pain or burning when urinating, or frequent urination) *BOWEL PROBLEMS (unusual diarrhea, constipation, pain near the anus) TENDERNESS IN MOUTH AND THROAT WITH OR WITHOUT PRESENCE OF ULCERS (sore throat, sores in mouth, or a toothache) UNUSUAL RASH, SWELLING OR PAIN  UNUSUAL VAGINAL DISCHARGE OR ITCHING   Items with * indicate a potential emergency and should be followed up as soon as possible or go to the Emergency Department if any problems should occur.  Please show the CHEMOTHERAPY ALERT CARD or IMMUNOTHERAPY ALERT CARD at check-in  to the Emergency Department and triage nurse.  Should you have questions after your visit or need to cancel or reschedule your appointment, please contact [facility] MEDICAL ONCOLOGY  Dept: [phone]  and follow the prompts.  Office hours are 8:00 a.m. to 4:30 p.m. Monday - Friday. Please note that voicemails left after 4:00 p.m. may not be returned until the following business day.  We are closed weekends and major holidays. You have access to a nurse at all times for urgent questions. Please call the main number to the clinic Dept: [phone] and follow the prompts.   For any non-urgent questions, you may also contact your provider using MyChart. We now offer e-Visits for anyone 18 and older to request care online for non-urgent symptoms. For details visit [email or web address].   Also download the MyChart app! Go to the app store, search "MyChart", open the app, select [facility], and log in with your MyChart username and password.  Masks are optional in the cancer centers. If you would like for your care team to wear a mask while they are taking care of you, please let them know. You may have one support person who is at least 75 years old accompany you for your appointments. 

## 2022-04-18 ENCOUNTER — Ambulatory Visit (HOSPITAL_COMMUNITY)
Admission: RE | Admit: 2022-04-18 | Discharge: 2022-04-18 | Disposition: A | Discharge status: Home / Self Care | Payer: Medicare HMO | Source: Ambulatory Visit | Attending: Hematology and Oncology | Admitting: Hematology and Oncology

## 2022-04-18 ENCOUNTER — Encounter (HOSPITAL_COMMUNITY): Payer: Self-pay

## 2022-04-18 DIAGNOSIS — D1771 Benign lipomatous neoplasm of kidney: Secondary | ICD-10-CM | POA: Diagnosis not present

## 2022-04-18 DIAGNOSIS — C5701 Malignant neoplasm of right fallopian tube: Secondary | ICD-10-CM | POA: Diagnosis not present

## 2022-04-18 DIAGNOSIS — K4090 Unilateral inguinal hernia, without obstruction or gangrene, not specified as recurrent: Secondary | ICD-10-CM | POA: Diagnosis not present

## 2022-04-18 DIAGNOSIS — C569 Malignant neoplasm of unspecified ovary: Secondary | ICD-10-CM | POA: Diagnosis not present

## 2022-04-18 DIAGNOSIS — I700 Atherosclerosis of aorta: Secondary | ICD-10-CM | POA: Diagnosis not present

## 2022-04-18 MED ORDER — HEPARIN SOD (PORK) LOCK FLUSH 100 UNIT/ML IV SOLN
500.0000 [IU] | Freq: Once | INTRAVENOUS | Status: AC
  Administered 2022-04-18: 500 [IU] via INTRAVENOUS

## 2022-04-18 MED ORDER — HEPARIN SOD (PORK) LOCK FLUSH 100 UNIT/ML IV SOLN
INTRAVENOUS | Status: AC
  Filled 2022-04-18: qty 5

## 2022-04-18 MED ORDER — IOHEXOL 300 MG/ML  SOLN
100.0000 mL | Freq: Once | INTRAMUSCULAR | Status: AC | PRN
  Administered 2022-04-18: 100 mL via INTRAVENOUS

## 2022-04-18 MED ORDER — IOHEXOL 9 MG/ML PO SOLN
1000.0000 mL | ORAL | Status: AC
  Administered 2022-04-18: 1000 mL via ORAL

## 2022-04-18 MED ORDER — SODIUM CHLORIDE (PF) 0.9 % IJ SOLN
INTRAMUSCULAR | Status: AC
  Filled 2022-04-18: qty 50

## 2022-04-18 MED ORDER — IOHEXOL 9 MG/ML PO SOLN
ORAL | Status: AC
  Filled 2022-04-18: qty 1000

## 2022-04-21 ENCOUNTER — Other Ambulatory Visit: Payer: Self-pay

## 2022-04-21 ENCOUNTER — Inpatient Hospital Stay: Payer: Medicare HMO | Attending: Gynecologic Oncology | Admitting: Hematology and Oncology

## 2022-04-21 ENCOUNTER — Encounter: Payer: Self-pay | Admitting: Hematology and Oncology

## 2022-04-21 VITALS — BP 132/87 | HR 63 | Temp 98.4°F | Resp 18 | Ht 59.0 in | Wt 185.2 lb

## 2022-04-21 DIAGNOSIS — I700 Atherosclerosis of aorta: Secondary | ICD-10-CM | POA: Diagnosis not present

## 2022-04-21 DIAGNOSIS — C5701 Malignant neoplasm of right fallopian tube: Secondary | ICD-10-CM | POA: Insufficient documentation

## 2022-04-21 DIAGNOSIS — Z7189 Other specified counseling: Secondary | ICD-10-CM | POA: Diagnosis not present

## 2022-04-21 DIAGNOSIS — C5700 Malignant neoplasm of unspecified fallopian tube: Secondary | ICD-10-CM | POA: Diagnosis not present

## 2022-04-21 DIAGNOSIS — C785 Secondary malignant neoplasm of large intestine and rectum: Secondary | ICD-10-CM | POA: Insufficient documentation

## 2022-04-21 DIAGNOSIS — C561 Malignant neoplasm of right ovary: Secondary | ICD-10-CM | POA: Diagnosis not present

## 2022-04-21 NOTE — Progress Notes (Signed)
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[facility]) I have reviewed imaging studies with the patient The patient has been in clinical remission for very long time We discussed the risk and benefits of discontinuation of maintenance bevacizumab and she is in agreement I will get her port removed I will get her scheduled to see GYN oncologist in 3 months and I plan to see her in 6 months  Goals of care, counseling/discussion We had goals of care discussions in the past There is no prove that continuing maintenance bevacizumab beyond 2 years would be beneficial It is likely that the patient is cured We will continue follow-up for 5 years I will order CT imaging once a year  Orders Placed This Encounter  Procedures   IR REMOVAL TUN ACCESS W/ PORT W/O FL MOD SED    Standing Status:   Future    Standing Expiration Date:   04/22/2023    Order Specific Question:   Reason for exam:    Answer:   no need port    Order Specific Question:   Preferred Imaging Location?    Answer:   [facility]    All questions were answered. The patient knows to call the clinic with any problems, questions or concerns. The total time spent in the appointment was 30 minutes encounter with patients including review of chart and various tests results, discussions about plan of care and coordination of care plan   [name], MD 04/21/2022 10:07 AM  INTERVAL HISTORY: Please see below for problem oriented charting. she returns for treatment follow-up and review of CT imaging results She is doing well She has no new symptoms except for bruising related to recent IV for contrast  REVIEW OF SYSTEMS:   Constitutional: Denies fevers, chills or abnormal weight loss Eyes:  Denies blurriness of vision Ears, nose, mouth, throat, and face: Denies mucositis or sore throat Respiratory: Denies cough, dyspnea or wheezes Cardiovascular: Denies palpitation, chest discomfort or lower extremity swelling Gastrointestinal:  Denies nausea, heartburn or change in bowel habits Skin: Denies abnormal skin rashes Lymphatics: Denies new lymphadenopathy or easy bruising Neurological:Denies numbness, tingling or new weaknesses Behavioral/Psych: Mood is stable, no new changes  All other systems were reviewed with the patient and are negative.  I have reviewed the past medical history, past surgical history, social history and family history with the patient and they are unchanged from previous note.  ALLERGIES:  is allergic to [employer] tartrate], bee venom, and cortisone.  MEDICATIONS:  Current Outpatient Medications  Medication Sig Dispense Refill   amLODipine (NORVASC) 10 MG tablet Take 1 tablet (10 mg total) by mouth daily. 30 tablet 11   busPIRone (BUSPAR) 15 MG tablet Take 15 mg by mouth 2 (two) times daily.     chlorthalidone (HYGROTON) 25 MG tablet Take 25 mg by mouth daily.     cholecalciferol (VITAMIN D3) 25 MCG (1000 UT) tablet Take 1,000 Units by mouth daily.     colestipol (COLESTID) 1 g tablet Take 2 g by mouth at bedtime. 1 tablet in the evening, 1/2 tablet in the morning     Eszopiclone 3 MG TABS Take 1 tablet (3 mg total) by mouth at bedtime. Take immediately before bedtime 30 tablet 5   Evolocumab (REPATHA SURECLICK)  140 MG/ML SOAJ Inject 1 mL into the skin every 14 (fourteen) days. 2 mL 11   ezetimibe (ZETIA) 10 MG tablet Take 10 mg by mouth once.     FIBER PO Take by mouth.     FLUoxetine (PROZAC) 20 MG capsule Take 40 mg by mouth every morning. Takes with a 5m capsule for her total dose of 348m     MELATONIN PO Take 10 mg by mouth at bedtime.      metFORMIN (GLUCOPHAGE-XR) 500 MG 24 hr tablet Take 500 mg by mouth daily with breakfast.   3    metoprolol tartrate (LOPRESSOR) 25 MG tablet Take 1 tablet (25 mg total) by mouth 2 (two) times daily. 60 tablet 3   modafinil (PROVIGIL) 200 MG tablet Take 1 tablet (200 mg total) by mouth every morning. 30 tablet 5   Multiple Vitamin (MULTIVITAMIN WITH MINERALS) TABS Take 1 tablet by mouth every morning.      rosuvastatin (CRESTOR) 40 MG tablet      telmisartan (MICARDIS) 40 MG tablet Take 40 mg by mouth daily.     No current facility-administered medications for this visit.    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  Interval development of large mid abdominal mass highly concerning for right ovarian cancer.  There is mild omental nodularity on the left, and peritoneal disease cannot be completely excluded.  There is no ascites or other evidence of metastatic disease. 2.  Mild associated renal pelvocaliectasis bilaterally without obstruction.  Nonobstructing left renal calculus and a small right renal angiomyolipoma noted incidentally.   11/08/2012 Pathology Results   1. 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 - BENIGN FALLOPIAN TUBE WITH NONSPECIFIC CHRONIC INFLAMMATION. 2. Ovary and fallopian tube, left - BENIGN OVARY; NEGATIVE  FOR ATYPIA OR MALIGNANCY. - BENIGN FALLOPIAN TUBE; NEGATIVE FOR ATYPIA OR MALIGNANCY. 3. Omentum, resection for tumor - HIGH GRADE CARCINOMA, SEE COMMENT. 4. Appendix, Other than Incidental - FIBROUS OBLITERATION OF APPENDICEAL TIP. - NEGATIVE FOR MALIGNANCY.   11/08/2012 Surgery   Surgery: Exploratory laparotomy, bilateral salpingo-oophorectomy, appendectomy, infacolic omentectomy, optimal debulking   Surgeons:  Paola A. GeAlycia RossettiMD; LiLahoma CrockerMD    Assistant: NaCaswell CorwinPathology: Bilateral fallopian tubes and ovaries to pathology. Appendix as well as omentum. 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   Operative findings: 25 cm right adnexal mass with smooth surface. Surgically absent uterus. Atrophic-appearing left ovary. Normal appearing appendix. Within the omentum there were centimeter nodules scattered throughout the omentum. The remainder of the surfaces were benign.   12/08/2012 Procedure   Impression:  Placement of a subcutaneous port device.  The catheter tip is in the lower SVC and ready to be used.     12/13/2012 - 03/28/2013 Chemotherapy   s/p 6 cycles of paclitaxel  and carboplatin   12/13/2012 - 03/28/2013 Chemotherapy   The patient had 6 cycles of carboplatin and Taxol   03/24/2013 Imaging   US abdomen   04/24/2013 Imaging   CT abdomen Interval resection of the large right pelvic and lower abdominal mass lesion with apparent omentectomy.  No evidence for intraperitoneal free fluid on today's study.  No discernible peritoneal lesions.   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   09/07/2013 Genetic Testing   Patient has genetic testing done for BRCA1/2 panel Results revealed patient has no mutation(s):   07/09/2015 Imaging   CT abdomen 1. 12 mm obstructive calculus at the left ureteropelvic junction with moderate proximal hydronephrosis. 2. 2 small supraumbilical ventral  hernias, one containing a short segment of the mid transverse colon and the other containing a short segment of the mid small bowel. There is no associated evidence to suggest bowel incarceration or obstruction at this time. 3. Tiny locule of gas non dependently in the lumen of the urinary bladder. This is presumably iatrogenic related to recent catheterization for urinalysis. Alternatively, this could be seen in the setting of urinary tract infection with gas-forming organisms. Clinical correlation for history of recent catheterization is recommended. 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 5. Status post cholecystectomy. 6. Additional incidental findings, as above.     12/11/2016 Imaging   Ct abdomen 1. No evidence of metastatic ovarian cancer. 2. Recurrent subxiphoid ventral abdominal wall hernia containing transverse colon. No evidence of incarceration or obstruction. 3. Stable incidental findings in the liver and kidneys. No recurrent urinary tract calculus. 4. Progressive lower lumbar spondylosis. 5.  Aortic Atherosclerosis (ICD10-I70.0).     08/30/2017 Imaging   MRI thoracic spine 1. At T5-6 there is a small central disc protrusion contacting the ventral thoracic spinal cord. No central canal or foraminal stenosis. 2. At T9-10 there is a small right paracentral disc protrusion. 3.  No acute osseous injury of the thoracic spine. 4. No aggressive osseous lesion to suggest metastatic disease.   09/24/2017 Tumor Marker   Patient's tumor was tested for the following markers: CA-125 Results of the tumor marker test revealed 21.7   09/30/2017 Imaging   CT abdomen 1. New small clustered soft tissue nodules in the left lower quadrant in the sigmoid mesentery, largest 1.0 cm, which could represent recurrent peritoneal tumor implants. No ascites.  2. Midline high ventral abdominal wall hernia containing a portion of the transverse colon is mildly increased in size, and  without bowel complication at this time. 3. Chronic findings include: Aortic Atherosclerosis (ICD10-I70.0). Diffuse hepatic steatosis. 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   10/11/2017 PET scan   1. Nodules in the sigmoid mesentery are hypermetabolic and highly worrisome for metastatic disease. 2. Attic steatosis.     11/03/2017 Procedure   Successful CT-guided rectus abdominal muscle mass core biopsy. Path: - FOREIGN BODY GIANT CELL REACTION INVOLVING FIBROADIPOSE TISSUE AND SKELETAL MUSCLE. - NO EVIDENCE OF MALIGNANCY.   11/04/2017 Cancer Staging   Staging form: Fallopian Tube, AJCC 7th Edition - Clinical: FIGO Stage IIIC, calculated as Stage IV (rT3, N0, M1) - Signed by [name], MD on 08/09/2019   02/2018 Imaging   CT: 1. Continued increase in size small peritoneal nodules along the mesenteric border of the proximal sigmoid colon as well as along the serosal surface of the proximal sigmoid colon. 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 2. No evidence of  distant disease. 3. Stable large ventral hernia.   05/2018 Imaging   PET: 1. Redemonstration of hypermetabolic nodules within the sigmoid mesocolon. Mild response to therapy relative to CT of 02/24/2018. Mixed response to therapy compared to the most recent PET of 10/11/2017. 2. Hypermetabolism within the right pelvic rectus musculature, increased since the prior PET.  Clinical service requested comparison to the 11/24/2017 CT. Index 10 mm nodule within the sigmoid mesocolon was similar to the 02/24/2018 CT, and as described on that exam, increased from 7 mm on 11/24/2017. More inferior nodule within the mesocolon measures 12 mm today on image 156/4 and 8 mm on 11/24/2017.   05/2018 Imaging   CT: IMPRESSION: 1. Since 02/24/2018, decreased size of peritoneal nodules centered in the sigmoid mesocolon. 2. No evidence of new or progressive  disease. 3. Hepatic steatosis. 4. Subcentimeter right renal angiomyolipoma, similar. 5.  Aortic Atherosclerosis (ICD10-I70.0). 6. Ventral abdominal wall laxity containing transverse colon, similar.   08/2018 Imaging   CT: IMPRESSION: 1. Nodules within the sigmoid mesocolon have decreased in size compared to prior. No new peritoneal or omental nodularity. 2. No evidence local recurrence the pelvis. 3. Ventral hernia contains a segment of transverse colon. No change from prior.     06/19/2019 Imaging   1. No evidence of metastatic disease in the abdomen pelvis. 2. No peritoneal or omental metastasis identified.  No free fluid. 3. Postcholecystectomy and hysterectomy. Comparison exams are made available. Comparison CT 09/08/2018 and 05/27/2018. PET-CT 05/27/2018    There is a nodule within the proximal aspect of the sigmoid colon measuring 2.2 by 2.2 cm. In comparison to prior CTs and PET-CT there was a hypermetabolic nodule at this location on the PET-CT of 08/27/2017 and on the CT of 09/08/2018 there was a small residual nodule. At that time (09/08/2018) the nodule measured 1.4 by 1.3 cm. Therefore this nodule has increased in the interval and concerning for recurrence of a serosal implant. The previous described lymph nodes in the sigmoid mesocolon and more central mesentery are not increased in size and not pathologic by size criteria.    Concern for recurrence of serosal metastasis in the proximal sigmoid colon with a 2 cm enlarging lesion. Lesion extends into the lumen. No evidence of high-grade obstruction. Consider FDG PET scan and/or potential colonoscopy for evaluation.   06/29/2019 PET scan   1. Enlarging serosal implant within the proximal sigmoid colon has intense metabolic activity most consistent with malignancy. Lesion exhibits a somewhat indolent progression as present on PET-CT scan from 10/11/2017 and 05/27/2018. 2. Focal hypermetabolic activity within the RIGHT rectus  muscle just off midline is slightly decreased from comparison exam. This may be benign inflammation related to prior laparotomy, however malignancy not excluded.   07/26/2019 Procedure   She had colonoscopy which showed multiple polyps.  6 polyps were removed from the cecum, measuring 3 to 6 mm in size.  One 12 mm polyp was removed from ascending colon and another 1 measures 7 mm.  One 5 mm polyp was removed from the transverse colon.  There is tumor noted in the sigmoid colon, approximately 35 cm from the anus which was biopsied.  The tumor appeared to be fungating, infiltrative and ulcerated but nonobstructive.  It encompassed approximately one third of the circumference of the lumen.   07/26/2019 Pathology Results   Multiple polyps came back tubular adenomas.  2 ascending polyps came back sessile serrated adenoma months.  Sigmoid colon biopsy confirmed metastatic high-grade serous carcinoma.  The morphology  and immunophenotype are consistent with metastatic high-grade serous carcinoma from tubal/ovarian primary site.   08/17/2019 - 12/12/2019 Chemotherapy   The patient had carboplatin and taxol   10/23/2019 Imaging   1. Sigmoid lesion with diminished size/conspicuity difficult to measure on the previous exam. Adjacent lymph nodes and nodularity less than a cm, largest on coronal image 48 measuring 5 mm. 2. Right rectus muscle with some thickening with mildly convex margin seen posteriorly on image 72 of series 2. This could be due to postsurgical change, however, metastatic disease is not excluded. Attention on follow-up is suggested. 3. No evidence for new metastatic disease. 4. Small hiatal hernia. 5. 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   Aortic Atherosclerosis (ICD10-I70.0).   01/01/2020 Imaging   1. No signs of new disease. 2. Tiny lymph nodes in the area of concern in the LEFT lower quadrant z. 3. Added density and subtle contour irregularity involving the rectus muscles best seen on  sagittal images, associated with site of prior surgical incision just to the RIGHT of midline (image 72, series 2 and image 58, series 5. Not significantly changed compared to prior studies. This measures approximately 2.1 x 1 cm in the sagittal plane and is less well-defined in the axial plane. Potentially postoperative change. Attention on follow-up. 4. Aortic atherosclerosis.   Aortic Atherosclerosis (ICD10-I70.0).       01/29/2020 - 04/07/2022 Chemotherapy   Patient is on Treatment Plan : Ovarian Bevacizumab     05/06/2020 Imaging   1. Status post hysterectomy and oophorectomy. 2. No specific findings of recurrent or metastatic disease in the abdomen or pelvis. 3. No change in postoperative appearance of low midline incision. 4. Unchanged small prominent subcentimeter left iliac lymph nodes.   10/22/2020 Imaging   1. Status post hysterectomy and oophorectomy. No evidence of recurrent or metastatic disease within the abdomen or pelvis. 2. No significant change in the subcentimeter prominent left iliac lymph nodes.   04/11/2021 Imaging   1. No findings to suggest recurrent or metastatic disease in the abdomen or pelvis. 2. Aortic atherosclerosis, in addition to at least left anterior descending coronary artery disease. Assessment for potential risk factor modification, dietary therapy or pharmacologic therapy may be warranted, if clinically indicated. 3. Additional incidental findings, as above.   10/15/2021 Imaging   Stable exam. No evidence of recurrent or metastatic carcinoma within the abdomen or pelvis.   Stable tiny benign right renal angiomyolipoma.   Aortic Atherosclerosis (ICD10-I70.0).     04/20/2022 Imaging   1. Stable exam. No new or progressive interval findings to suggest recurrent or metastatic disease. 2. Stable tiny benign right adrenal angiomyolipoma. 3.  Aortic Atherosclerois (ICD10-170.0)     PHYSICAL EXAMINATION: ECOG PERFORMANCE STATUS: 1 - Symptomatic but  completely ambulatory  Vitals:   04/21/22 0926  BP: 132/87  Pulse: 63  Resp: 18  Temp: 98.4 F (36.9 C)  SpO2: 99%   Filed Weights   04/21/22 0926  Weight: 185 lb 3.2 oz (84 kg)    GENERAL:alert, no distress and comfortable NEURO: alert & oriented x 3 with fluent speech, no focal motor/sensory deficits  LABORATORY DATA:  I have reviewed the data as listed    Component Value Date/Time   NA 139 04/07/2022 0803   NA 141 12/02/2016 1219   K 3.8 04/07/2022 0803   K 3.7 12/02/2016 1219   CL 104 04/07/2022 0803   CL 103 01/18/2013 1329   CO2 30 04/07/2022 0803   CO2 27 12/02/2016 1219  GLUCOSE 126 (H) 04/07/2022 0803   GLUCOSE 98 12/02/2016 1219   GLUCOSE 110 (H) 01/18/2013 1329   BUN 17 04/07/2022 0803   BUN 15.3 12/02/2016 1219   CREATININE 0.65 04/07/2022 0803   CREATININE 0.71 05/23/2021 0758   CREATININE 0.7 12/02/2016 1219   CALCIUM 10.1 04/07/2022 0803   CALCIUM 10.1 12/02/2016 1219   PROT 6.8 04/07/2022 0803   PROT 7.1 12/02/2016 1219   ALBUMIN 4.4 04/07/2022 0803   ALBUMIN 4.2 12/02/2016 1219   AST 33 04/07/2022 0803   AST 25 05/23/2021 0758   AST 21 12/02/2016 1219   ALT 34 04/07/2022 0803   ALT 26 05/23/2021 0758   ALT 27 12/02/2016 1219   ALKPHOS 70 04/07/2022 0803   ALKPHOS 104 12/02/2016 1219   BILITOT 0.6 04/07/2022 0803   BILITOT 0.6 05/23/2021 0758   BILITOT 0.37 12/02/2016 1219   GFRNONAA >60 04/07/2022 0803   GFRNONAA >60 05/23/2021 0758   GFRAA >60 05/07/2020 0833    No results found for: "SPEP", "UPEP"  Lab Results  Component Value Date   WBC 5.2 04/07/2022   NEUTROABS 3.1 04/07/2022   HGB 14.0 04/07/2022   HCT 41.6 04/07/2022   MCV 88.5 04/07/2022   PLT 131 (L) 04/07/2022      Chemistry      Component Value Date/Time   NA 139 04/07/2022 0803   NA 141 12/02/2016 1219   K 3.8 04/07/2022 0803   K 3.7 12/02/2016 1219   CL 104 04/07/2022 0803   CL 103 01/18/2013 1329   CO2 30 04/07/2022 0803   CO2 27 12/02/2016 1219   BUN  17 04/07/2022 0803   BUN 15.3 12/02/2016 1219   CREATININE 0.65 04/07/2022 0803   CREATININE 0.71 05/23/2021 0758   CREATININE 0.7 12/02/2016 1219   GLU 236 12/13/2012 1558      Component Value Date/Time   CALCIUM 10.1 04/07/2022 0803   CALCIUM 10.1 12/02/2016 1219   ALKPHOS 70 04/07/2022 0803   ALKPHOS 104 12/02/2016 1219   AST 33 04/07/2022 0803   AST 25 05/23/2021 0758   AST 21 12/02/2016 1219   ALT 34 04/07/2022 0803   ALT 26 05/23/2021 0758   ALT 27 12/02/2016 1219   BILITOT 0.6 04/07/2022 0803   BILITOT 0.6 05/23/2021 0758   BILITOT 0.37 12/02/2016 1219       RADIOGRAPHIC STUDIES: I have reviewed multiple imaging studies with the patient I have personally reviewed the radiological images as listed and agreed with the findings in the report. CT ABDOMEN PELVIS W CONTRAST  Result Date: 04/20/2022 CLINICAL DATA:  Ovarian cancer.  Restaging.  * Tracking Code: BO * EXAM: CT ABDOMEN AND PELVIS WITH CONTRAST TECHNIQUE: Multidetector CT imaging of the abdomen and pelvis was performed using the standard protocol following bolus administration of intravenous contrast. RADIATION DOSE REDUCTION: This exam was performed according to the departmental dose-optimization program which includes automated exposure control, adjustment of the mA and/or kV according to patient size and/or use of iterative reconstruction technique. CONTRAST:  110m OMNIPAQUE IOHEXOL 300 MG/ML  SOLN COMPARISON:  10/15/2021 FINDINGS: Lower chest: Unremarkable. Hepatobiliary: No suspicious focal abnormality within the liver parenchyma. 15 mm exophytic lesion medial segment left liver on 16/3 is unchanged. Gallbladder surgically absent. No intrahepatic or extrahepatic biliary dilation. Pancreas: No focal mass lesion. No dilatation of the main duct. No intraparenchymal cyst. No peripancreatic edema. Spleen: No splenomegaly. No focal mass lesion. Adrenals/Urinary Tract: No adrenal nodule or mass. 10 mm angiomyolipoma interpolar  right kidney is unchanged. Left kidney unremarkable. No evidence for hydroureter. The urinary bladder appears normal for the degree of distention. Stomach/Bowel: Stomach is unremarkable. No gastric wall thickening. No evidence of outlet obstruction. Duodenum is normally positioned as is the ligament of Treitz. No small bowel wall thickening. No small bowel dilatation. The terminal ileum is normal. The appendix is not well visualized, but there is no edema or inflammation in the region of the cecum. No gross colonic mass. No colonic wall thickening. Vascular/Lymphatic: There is mild atherosclerotic calcification of the abdominal aorta without aneurysm. There is no gastrohepatic or hepatoduodenal ligament lymphadenopathy. No retroperitoneal or mesenteric lymphadenopathy. No pelvic sidewall lymphadenopathy. Reproductive: Uterus surgically absent.  There is no adnexal mass. Other: No intraperitoneal free fluid. Musculoskeletal: Small left groin hernia contains only fat. No worrisome lytic or sclerotic osseous abnormality. IMPRESSION: 1. Stable exam. No new or progressive interval findings to suggest recurrent or metastatic disease. 2. Stable tiny benign right adrenal angiomyolipoma. 3.  Aortic Atherosclerois (ICD10-170.0) Electronically Signed   By: Misty Stanley M.D.   On: 04/20/2022 09:42

## 2022-04-21 NOTE — Assessment & Plan Note (Signed)
We had goals of care discussions in the past There is no prove that continuing maintenance bevacizumab beyond 2 years would be beneficial It is likely that the patient is cured We will continue follow-up for 5 years I will order CT imaging once a year

## 2022-04-21 NOTE — Assessment & Plan Note (Signed)
I have reviewed imaging studies with the patient The patient has been in clinical remission for very long time We discussed the risk and benefits of discontinuation of maintenance bevacizumab and she is in agreement I will get her port removed I will get her scheduled to see GYN oncologist in 3 months and I plan to see her in 6 months

## 2022-04-22 ENCOUNTER — Telehealth: Payer: Self-pay | Admitting: *Deleted

## 2022-04-22 NOTE — Telephone Encounter (Signed)
Attempted to reach patient to schedule follow up appt in December with Dr Ernestina Patches.  Left message to call back.

## 2022-04-23 NOTE — Telephone Encounter (Signed)
Attempted to reach patient to schedule follow up with Dr Ernestina Patches in December.  Left message

## 2022-04-27 NOTE — Progress Notes (Unsigned)
PATIENT: Kristin Webb DOB: 06-Oct-1945  REASON FOR VISIT: follow up HISTORY FROM: patient  No chief complaint on file.    HISTORY OF PRESENT ILLNESS:  04/27/22 ALL:  Kristin Webb returns for follow up for OSA on CPAP, EDS and insomnia.   She continues eszopiclone 13m QHS. Belsomra? She is taking modafinil 20640mdaily.     09/24/2020 ALL: She returns for follow up for OSA on CPAP, EDS and insomnia. She continues CPAP nightly. Lunesta 40m40melps with insomnia. She takes armodafinil 250m24m needed for EDS and fatigue. She is not sure if these medications are working. She reports that since the holidays, she has had difficulty waking every 3-5 hours. She is having more "negative" dreams. She has been seen by PCP who was concerned about anxiety related to stage IV cancer diagnosis on Avastin (started about 6-8 months ago). Fluoxetine was increased to 40mg11mly and she was started on buspirone 15mg 39mabout 2 months ago. She is seeing a counseSocial workerhas recommended psychiatry if not responding to fluoxetine and buspirone. She does feel medications are helping.   Compliance report dated 08/24/2020 through 09/22/2020 reveals that she used CPAP 30 of the past 30 days for compliance of 100%.  She used CPAP greater than 4 hours 28 of the past 30 days for compliance of 93%.  Average usage on days used was 7 hours and 15 minutes.  Residual AHI was 1.7 on a set pressure of 16 cm of water.  There was a leak noted in the 95th percentile of 27.8 L/min.   09/25/2019 ALL:  Kristin MJannis Atkins76 y.o76female here today for follow up for OSA on CPAP.  Overall, she is doing fairly well with her CPAP machine.  She has recently restarted chemotherapy for stage IV ovarian cancer.  She is anticipating receiving her third of 6 total treatments this week.  She will then have a PET scan to determine how long therapy will need to be continued.  There is also a question of possible need for colon resection as tumor is  attached to part of her colon.  She has noted that her skin is more sensitive.  She has bags under her eyes that she feels are pinched with the current full facemask she is using. Currently using ResMed Airfit small FFM.  She does have more difficulty falling asleep.  She is now using melatonin 10 mg and Benadryl 25 mg about an hour before bedtime.  She continues Lunesta to help her stay asleep.  Armodafinil does help with daytime sleepiness.  She does not use this medication on chemotherapy days as she feels there is no benefit.  She does endorse increasing fatigue and feels this is related to her chemotherapy.  Compliance report dated 08/21/2018 08/20/2019 reveals that she used CPAP 30 out of the last 30 days for compliance of 100%.  She used CPAP greater than 4 hours 28 of the last 30 days for compliance of 93%.  Average usage was 8 hours and 44 minutes.  Residual AHI was 1.2 on 16 cm of water and EPR of 1.  There was no significant leak noted.  HISTORY: (copied from Dr Dohmeier's note on 05/23/2019)  HPI:  Kristin MSiomara Burkel73 y.o79female patient with a history of OSA on CPAP -and persistent fatigue, is seen here in a revisit for CPAP follow up/ compliance visit.  She is a practiGlass blower/designera large social circle. She is living alone  with her rescue dog, and is optimistic and happy.  Rv 05-23-2019-I have the pleasure of looking at the data download for Kristin Webb encompassing 30 days before 21 May 2019.  She has been 100% compliant with an average usage time of 8 hours 90 minutes, minimum pressure is 5 maximum pressure is applied is 15 cmH2O with 1 cm expiratory pressure relief.  Residual AHI is 1.4 which is an excellent resolution she has minimal air leaks her 95th percentile pressure is 14.9cm  this may be related to some weight gain. She has used melatonin, benadryl.    Kristin Webb is seen here on 09 May 2018, she has been a highly compliant CPAP user, her downloaded data  however have to be combined from 2 different machines.  For general use at home she has an air sense 10 AutoSet machine and then she has a travel CPAP as well.  The average user time on days used is 8 hours and 59 minutes, she has used CPAP in one form or another for 97% of the recorded time, she is using AutoSet 5-15 cmH2O was 1 cm EPR level the 95th percentile pressure required is 14.5 which straddles the upper limit.  Residual AHI is 0.8 which is excellent and there are no major air leaks.  She has gained weight since I saw her last and her BMI rose by over 4 points this may explain why she needs a higher setting at this time.  Her Epworth Sleepiness Scale was endorsed at 13 out of 24 possible points and her fatigue severity at 44 out of 63 possible points, the geriatric depression scale was endorsed at 1 out of 15 points.   Interval history from 05/05/2017, Kristin Webb is doing well, she is living by herself with her dog, recently had some home restoration to do. This added a little bit of hectic to her life, however she is doing well and her counselor has not progressed. She is followed by a new oncologist, [name]. She underwent another HST following significant weight gain, AHI was 9.1 /hr. She has many desaturations and was placed on auto CPAP in response to high EDS with mild apnea.  Her compliance to CPAP with excellent at 90% with an average use of 8 hours and 32 minutes, she is using an AutoSet between 5 and 15 cm water was 1 cm EPR and her residual AHI is 1.9. This is excellent resolution of her apnea. The 95th percentile pressure is 14.8, there were no central apneas emerging, there is no need to adjust the machine in any way. She endorsed the Epworth Sleepiness Scale at 9 points, fatigue severity at 44 points and the geriatric depression score is 1 out of 15 points. She continues to participate in a grief group after her husband's death. She started Yoga, Zen knitting and fosters dogs.     12-12-13,Since I have seen her last this patient was diagnosed with ovarian cancer, underwent surgery and chemotherapy. She currently struggles with a weak  abdominal and core musculatur  and it feels to her she had an abdominal hernia. She has tried a corsett , but this has aggravated her bulging discs in her spine.  The patient was originally referred by Dr. Sheryn Bison at the time with fatigue , pain, excessive sleepiness and burning mouth syndrome. In 2010 she developed transiently left mouth droop, and a TIA/ CVA work up was negative. EMG and nerve conduction studies were unrevealing , a rheumatology consult,  pain specialist , neurosurgical evaluation were unrevealing .  Labs revealed a vitamin D deficiency,  normal TSH and CMET, CBC.  Her neurosurgery consult for back pain was without results.   She was referred to me for a sleep consultation in 2009. The patient was diagnosed with sleep apnea on 03-07-08 with an AHI of 24.3 and in REM AHI of 82. Her BMI at the time was 37.9 , she was titrated to 10 cm water pressure on CPAP  which relieved her AHI but she still snored.  Kristin Webb's newest  sleep study dated 11-26-13 whowed  residual apnea. The study was therefore a normal polysomnography and not split into a titration parts. The AHI was 0.9, the RDI was 2.4 and the oxygen nadir was 87% time and his saturation was 33.6 minutes at or below 90% saturation of oxygen. Heart which was regular, normal sinus rhythm prevailed. She still sleeps better with Lunesta 2 mg could not get a good result from 1 mg pills. Fragmentation of sleep, alpha intrusion can be seen in chronic pain and fatigue.She is not a coffee drinker, there is no caffeine to be cut out. There is no pain in sleep. Sleep psychology referral is problematic with [facility]. Sleep hygiene was discussed.    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 She has compliantly  used a  Original machine,  New the CPAP   machine seems to have failed. I am unable to obtain any data. In addition the patient had remained excessively fatigued and daytime sleepy. Possibly , this could have been a paraneoplastic manifestation of ovarian malignancy. She has been using Nuvigil as tolerated which has given her improvement in life quality and energy.     The last CPAP  download was on 05-06-12 with the 95% percentile pressure of 12 cm water, the residual AHI of 0.5 and an average of 5.9 hours of night-user time for CPAP.  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 . Kristin Webb since being diagnosed with ovarian cancer has underwent a lot of emotional changes as well. Her oncologist, Dr. Marko Plume, feels that some of the nausea is GERD-   So far since last week has not been a palpable results. The patient has also tried to wean off her sleep aids but became panicked and insomniac again.Kristin Webb had been admitted to the hospital after a spot on his lung was incidentally found , while being evaluated for kidney stones. He went into acute respiratory distress was placed on oxygen oxygen and readmitted to the hospital but never recovered. And during that time she needed to return to her previous insomnia regimen. She is under financial distress, had a lot of difficulties since her husbands death.   CD Interval history from 05/18/2016, Kristin Webb has been able to cut down on her Lunesta dose from 2 mg to 1 mg and uses if necessary Nuvigil to drive. She has slept usually uninterrupted as of last summer also thinks have changed again in 2017 now she begun in August to wake up 3-4 times a week in AM with a headache, very unusual for her. Nausea is associated with these headaches, no photophobia or phonophobia is reported. And as of August she has needed and used Nuvigil 125 g daily again. Not waking up in the night,  told that she snores by a roommate during a church trip. Her fatigue  is reminiscent of the times when she had suffered from sleep apnea.    CD Interval history from 07/15/2016, Kristin Webb is here to see the results of her recent home sleep test, performed on 06/10/2016. Her AHI was 2.2 her RDI was 5 per hour there was no significant sleep apnea noted no oxygen desaturation was found, and she did not have tachycardia or bradycardia arrhythmia. Her fatigue has remained her Epworth sleepiness score has actually increased to 12 point from 10 during our last visit. She states that she often only gets up in the morning before because she has to let the dog out but then feels that she needs another hour of sleep afterwards. She also suffers from dry mouth and dry eyes. There is a possible autoimmune component, and she does suffer from fibromyalgia.  She does not endorse a significant depression score since she lost her husband she has been grieving but she has not "fallen into a deep hole". She sleeps with the help of Lunesta 1 mg and Benadryl, she has failed Ambien (which caused her to sleep walk). She was also significantly more drowsy in daytime. She could neither tolerate Ambien 10 not 5 mg.   REVIEW OF SYSTEMS: Out of a complete 14 system review of symptoms, the patient complains only of the following symptoms, insomnia, apnea, frequent waking, daytime sleepiness, snoring, dizziness, headaches, weakness since starting chemo and all other reviewed systems are negative.  Epworth Sleepiness Scale: 17 Fatigue severity scale: 53  ALLERGIES: Allergies  Allergen Reactions   Ambien [Zolpidem Tartrate] Other (See Comments)    Sleep walking   Bee Venom Anaphylaxis and Swelling    Difficulty breathing, carries an EPI-pen   Cortisone Nausea And Vomiting, Swelling and Other (See Comments)    INJECTED CORTISONE ONLY, "sick to my stomach, throwing up, hand swelling, and pain."    HOME  MEDICATIONS: Outpatient Medications Prior to Visit  Medication Sig Dispense Refill   amLODipine (NORVASC) 10 MG tablet Take 1 tablet (10 mg total) by mouth daily. 30 tablet 11   busPIRone (BUSPAR) 15 MG tablet Take 15 mg by mouth 2 (two) times daily.     chlorthalidone (HYGROTON) 25 MG tablet Take 25 mg by mouth daily.     cholecalciferol (VITAMIN D3) 25 MCG (1000 UT) tablet Take 1,000 Units by mouth daily.     colestipol (COLESTID) 1 g tablet Take 2 g by mouth at bedtime. 1 tablet in the evening, 1/2 tablet in the morning     Eszopiclone 3 MG TABS Take 1 tablet (3 mg total) by mouth at bedtime. Take immediately before bedtime 30 tablet 5   Evolocumab (REPATHA SURECLICK) 412 MG/ML SOAJ Inject 1 mL into the skin every 14 (fourteen) days. 2 mL 11   ezetimibe (ZETIA) 10 MG tablet Take 10 mg by mouth once.     FIBER PO Take by mouth.     FLUoxetine (PROZAC) 20 MG capsule Take 40 mg by mouth every morning. Takes with a 54m capsule for her total dose of 356m     MELATONIN PO Take 10 mg by mouth at bedtime.      metFORMIN (GLUCOPHAGE-XR) 500 MG 24 hr tablet Take 500 mg by mouth daily with breakfast.   3   metoprolol tartrate (LOPRESSOR) 25 MG tablet Take 1 tablet (25 mg total) by mouth 2 (two) times daily. 60 tablet 3   modafinil (PROVIGIL) 200 MG tablet Take 1 tablet (200 mg total) by mouth every morning. 30 tablet  5   Multiple Vitamin (MULTIVITAMIN WITH MINERALS) TABS Take 1 tablet by mouth every morning.      rosuvastatin (CRESTOR) 40 MG tablet      telmisartan (MICARDIS) 40 MG tablet Take 40 mg by mouth daily.     No facility-administered medications prior to visit.    PAST MEDICAL HISTORY: Past Medical History:  Diagnosis Date   Burning mouth syndrome    Chronic fatigue    Constipation    Diabetes ([place]) 05/09/2018   Diarrhea    in the past after gallbladder removal   Fibromyalgia    GERD (gastroesophageal reflux disease)    History of kidney stones 07/2015   Hyperlipidemia     Hypertension    borderline not on meds    Insomnia secondary to depression with anxiety    Lumbar disc disease    Ovarian cancer ([place]) 2014/2020   met nodule in abd   Pelvic mass in female    Pneumonia    hx of pneumonia as an infant   PONV (postoperative nausea and vomiting)    pain from gas hernia 2017   Shortness of breath    with exertion    Sleep apnea    uses CPAP   Yeast infection     PAST SURGICAL HISTORY: Past Surgical History:  Procedure Laterality Date   ABDOMINAL HYSTERECTOMY     early 50s   APPENDECTOMY     WITH DEBULKING/BSO   CHOLECYSTECTOMY     early 110s   CYSTOSCOPY W/ URETERAL STENT PLACEMENT Left 07/09/2015   DUE TO NEPHROLITHIASIS Procedure: CYSTOSCOPY WITH LEFT RETROGRADE PYELOGRAM/ LEFT URETERAL STENT PLACEMENT;  Surgeon: Ardis Hughs, MD;  Location: [facility];  Service: Urology;  Laterality: Left;   HERNIA REPAIR     INCISIONAL HERNIA REPAIR N/A 12/25/2015   WITH MESH Procedure:  INCISIONAL HERNIA REPAIR;  Surgeon: Ralene Ok, MD;  Location: [facility];  Service: General;  Laterality: N/A;   [place] N/A 10/21/2018   Procedure: LAPAROSCOPIC INCISIONAL HERNIA REPAIR WITH MESH;  Surgeon: Ralene Ok, MD;  Location: [facility];  Service: General;  Laterality: N/A;   INSERTION OF MESH N/A 12/25/2015   Procedure: INSERTION OF MESH;  Surgeon: Ralene Ok, MD;  Location: [facility];  Service: General;  Laterality: N/A;   LAPAROTOMY Bilateral 11/08/2012   Procedure: EXPLORATORY LAPAROTOMY BILATERAL SAL[place]O OOPHORECTOMY TUMOR DEBULKING ;  Surgeon: Imagene Gurney A. Alycia Rossetti, MD;  Location: [facility];  Service: Gynecology;  Laterality: Bilateral;  APPENDECTOMY / OMENTECTOMY   LAPAROTOMY N/A 12/25/2015   Procedure: EXPLORATORY LAPAROTOMY;  Surgeon: Ralene Ok, MD;  Location: [facility];  Service: General;  Laterality: N/A;   LYSIS OF ADHESION N/A 12/25/2015   Procedure: LYSIS OF ADHESION;  Surgeon: Ralene Ok, MD;  Location: [facility];  Service: General;   Laterality: N/A;   PARTIAL HYSTERECTOMY  2006   TRIGGER FINGER RELEASE      FAMILY HISTORY: Family History  Problem Relation Age of Onset   Lung cancer Mother 21   High blood pressure Mother    High Cholesterol Mother    Lung cancer Father 29       2 ppd smoker   Parkinson's disease Father    Kidney disease Other     SOCIAL HISTORY: Social History   Socioeconomic History   Marital status: Widowed    Spouse name: [name]   Number of children: 2   Years of education: Master's   Highest education level: Not on file  Occupational History  Not on file  Tobacco Use   Smoking status: Never   Smokeless tobacco: Never  Vaping Use   Vaping Use: Never used  Substance and Sexual Activity   Alcohol use: No   Drug use: No   Sexual activity: Not on file  Other Topics Concern   Not on file  Social History Narrative   Patient is married [name]). Husband passed away in October 07, 2014   Patient has 2 children by birth and 13 children all together.   Patient has a Oceanographer.   Patient is right-handed.   Patient drinks very little caffeine.         Social Determinants of Health   Financial Resource Strain: Not on file  Food Insecurity: Not on file  Transportation Needs: Not on file  Physical Activity: Not on file  Stress: Not on file  Social Connections: Not on file  Intimate Partner Violence: Not on file      PHYSICAL EXAM  There were no vitals filed for this visit.  There is no height or weight on file to calculate BMI.  Generalized: Well developed, in no acute distress  Cardiology: normal rate and rhythm, no murmur noted Respiratory: Clear to auscultation bilaterally  Neurological examination  Mentation: Alert oriented to time, place, history taking. Follows all commands speech and language fluent Cranial nerve II-XII: Pupils were equal round reactive to light. Extraocular movements were full, visual field were full  Motor: The motor testing reveals 5 over 5 strength of  all 4 extremities. Good symmetric motor tone is noted throughout.  Gait and station: Gait is normal.    DIAGNOSTIC DATA (LABS, IMAGING, TESTING) - I reviewed patient records, labs, notes, testing and imaging myself where available.      No data to display           Lab Results  Component Value Date   WBC 5.2 04/07/2022   HGB 14.0 04/07/2022   HCT 41.6 04/07/2022   MCV 88.5 04/07/2022   PLT 131 (L) 04/07/2022      Component Value Date/Time   NA 139 04/07/2022 0803   NA 141 12/02/2016 1219   K 3.8 04/07/2022 0803   K 3.7 12/02/2016 1219   CL 104 04/07/2022 0803   CL 103 01/18/2013 1329   CO2 30 04/07/2022 0803   CO2 27 12/02/2016 1219   GLUCOSE 126 (H) 04/07/2022 0803   GLUCOSE 98 12/02/2016 1219   GLUCOSE 110 (H) 01/18/2013 1329   BUN 17 04/07/2022 0803   BUN 15.3 12/02/2016 1219   CREATININE 0.65 04/07/2022 0803   CREATININE 0.71 05/23/2021 0758   CREATININE 0.7 12/02/2016 1219   CALCIUM 10.1 04/07/2022 0803   CALCIUM 10.1 12/02/2016 1219   PROT 6.8 04/07/2022 0803   PROT 7.1 12/02/2016 1219   ALBUMIN 4.4 04/07/2022 0803   ALBUMIN 4.2 12/02/2016 1219   AST 33 04/07/2022 0803   AST 25 05/23/2021 0758   AST 21 12/02/2016 1219   ALT 34 04/07/2022 0803   ALT 26 05/23/2021 0758   ALT 27 12/02/2016 1219   ALKPHOS 70 04/07/2022 0803   ALKPHOS 104 12/02/2016 1219   BILITOT 0.6 04/07/2022 0803   BILITOT 0.6 05/23/2021 0758   BILITOT 0.37 12/02/2016 1219   GFRNONAA >60 04/07/2022 0803   GFRNONAA >60 05/23/2021 0758   GFRAA >60 05/07/2020 0833   No results found for: "CHOL", "HDL", "LDLCALC", "LDLDIRECT", "TRIG", "CHOLHDL" Lab Results  Component Value Date   HGBA1C 6.0 (H) 10/17/2018   No results  found for: "VITAMINB12" No results found for: "TSH"     ASSESSMENT AND PLAN 76 y.o. year old female  has a past medical history of Burning mouth syndrome, Chronic fatigue, Constipation, Diabetes ([place]) (05/09/2018), Diarrhea, Fibromyalgia, GERD (gastroesophageal  reflux disease), History of kidney stones (07/2015), Hyperlipidemia, Hypertension, Insomnia secondary to depression with anxiety, Lumbar disc disease, Ovarian cancer ([place]) (2014/2020), Pelvic mass in female, Pneumonia, PONV (postoperative nausea and vomiting), Shortness of breath, Sleep apnea, and Yeast infection. here with   No diagnosis found.     Marti continues to do well with CPAP therapy.  She is having more fatigue, negative dreams and frequent waking. She feels symptoms clearly worsened after starting Avastin. She reports symptoms seem to be improving some following increased dose of fluoxetine and addition of buspirone. She continues excellent compliance with CPAP therapy. She has worked with DME to find a mask that fits better. She does have a leak but AHI remains at goal. She was encouraged to continue using CPAP nightly and for greater than 4 hours each night.  She may continue melatonin and Benadryl as needed for insomnia.  We will continue Lunesta as this is helping her stay asleep.  She will continue armodafinil for daytime sleepiness. She will continue to follow up closely with oncology and PCP. She will call us with any new concerns.  She will follow-up with [place] in 1 year, sooner if needed.  She verbalizes understanding and agreement with this plan.   No orders of the defined types were placed in this encounter.    No orders of the defined types were placed in this encounter.     I spent 25 minutes with the patient. 50% of this time was spent counseling and educating patient on plan of care and medications.    Debbora Presto, FNP-C 04/27/2022, 4:47 PM [facility] [address] [place], [place] [address] [phone]

## 2022-04-27 NOTE — Patient Instructions (Signed)
Below is our plan:  We will continue Lunesta '3mg'$  daily at bedtime. Consider adding an extended release dose of melatonin or choose a brand of melatonin that has both immediate release and extended release dosing in one.   Continue modafinil '200mg'$  daily.   Please continue using your CPAP regularly. While your insurance requires that you use CPAP at least 4 hours each night on 70% of the nights, I recommend, that you not skip any nights and use it throughout the night if you can. Getting used to CPAP and staying with the treatment long term does take time and patience and discipline. Untreated obstructive sleep apnea when it is moderate to severe can have an adverse impact on cardiovascular health and raise her risk for heart disease, arrhythmias, hypertension, congestive heart failure, stroke and diabetes. Untreated obstructive sleep apnea causes sleep disruption, nonrestorative sleep, and sleep deprivation. This can have an impact on your day to day functioning and cause daytime sleepiness and impairment of cognitive function, memory loss, mood disturbance, and problems focussing. Using CPAP regularly can improve these symptoms.  Please make sure you are staying well hydrated. I recommend 50-60 ounces daily. Well balanced diet and regular exercise encouraged. Consistent sleep schedule with 6-8 hours recommended.   Please continue follow up with care team as directed.   Follow up with Dr Brett Fairy in 4-6 months for reevaluation of insomnia medicaiton regimen.   You may receive a survey regarding today's visit. I encourage you to leave honest feed back as I do use this information to improve patient care. Thank you for seeing me today!

## 2022-04-28 ENCOUNTER — Encounter: Payer: Self-pay | Admitting: Family Medicine

## 2022-04-28 ENCOUNTER — Ambulatory Visit: Payer: Medicare HMO | Admitting: Family Medicine

## 2022-04-28 VITALS — BP 124/79 | HR 78 | Ht 59.0 in | Wt 187.6 lb

## 2022-04-28 DIAGNOSIS — G4719 Other hypersomnia: Secondary | ICD-10-CM | POA: Diagnosis not present

## 2022-04-28 DIAGNOSIS — G4733 Obstructive sleep apnea (adult) (pediatric): Secondary | ICD-10-CM | POA: Diagnosis not present

## 2022-04-28 DIAGNOSIS — R530 Neoplastic (malignant) related fatigue: Secondary | ICD-10-CM | POA: Diagnosis not present

## 2022-04-28 DIAGNOSIS — Z9989 Dependence on other enabling machines and devices: Secondary | ICD-10-CM

## 2022-04-28 DIAGNOSIS — F5102 Adjustment insomnia: Secondary | ICD-10-CM

## 2022-05-05 DIAGNOSIS — E782 Mixed hyperlipidemia: Secondary | ICD-10-CM | POA: Diagnosis not present

## 2022-05-05 DIAGNOSIS — Z6836 Body mass index (BMI) 36.0-36.9, adult: Secondary | ICD-10-CM | POA: Diagnosis not present

## 2022-05-05 DIAGNOSIS — E1159 Type 2 diabetes mellitus with other circulatory complications: Secondary | ICD-10-CM | POA: Diagnosis not present

## 2022-05-05 DIAGNOSIS — I10 Essential (primary) hypertension: Secondary | ICD-10-CM | POA: Diagnosis not present

## 2022-05-07 NOTE — Telephone Encounter (Signed)
Per MD sent letter to patient requesting to call and schedule follow up visit.

## 2022-05-08 ENCOUNTER — Telehealth: Payer: Self-pay

## 2022-05-08 NOTE — Telephone Encounter (Signed)
Kristin Webb is scheduled to see Dr. Bernadene Bell per Dr. Berline Lopes on !09-27-21.  Kristin Webb stated that she also has been experiencing perineal itching as well in thigh folds for months. Reviewed with Joylene John, NP.  Pt not seen with GYN ONC for a while.  Pt needs to call PCP who manages her diabetes to discuss itching. Pt verbalized understanding.

## 2022-05-19 DIAGNOSIS — R800 Isolated proteinuria: Secondary | ICD-10-CM | POA: Diagnosis not present

## 2022-05-19 DIAGNOSIS — E1159 Type 2 diabetes mellitus with other circulatory complications: Secondary | ICD-10-CM | POA: Diagnosis not present

## 2022-05-19 DIAGNOSIS — E782 Mixed hyperlipidemia: Secondary | ICD-10-CM | POA: Diagnosis not present

## 2022-05-26 DIAGNOSIS — I10 Essential (primary) hypertension: Secondary | ICD-10-CM | POA: Diagnosis not present

## 2022-05-26 DIAGNOSIS — C5700 Malignant neoplasm of unspecified fallopian tube: Secondary | ICD-10-CM | POA: Diagnosis not present

## 2022-05-26 DIAGNOSIS — E1159 Type 2 diabetes mellitus with other circulatory complications: Secondary | ICD-10-CM | POA: Diagnosis not present

## 2022-05-26 DIAGNOSIS — M797 Fibromyalgia: Secondary | ICD-10-CM | POA: Diagnosis not present

## 2022-05-26 DIAGNOSIS — R800 Isolated proteinuria: Secondary | ICD-10-CM | POA: Diagnosis not present

## 2022-05-26 DIAGNOSIS — E782 Mixed hyperlipidemia: Secondary | ICD-10-CM | POA: Diagnosis not present

## 2022-05-26 DIAGNOSIS — N182 Chronic kidney disease, stage 2 (mild): Secondary | ICD-10-CM | POA: Diagnosis not present

## 2022-05-26 DIAGNOSIS — I2510 Atherosclerotic heart disease of native coronary artery without angina pectoris: Secondary | ICD-10-CM | POA: Diagnosis not present

## 2022-05-26 DIAGNOSIS — I700 Atherosclerosis of aorta: Secondary | ICD-10-CM | POA: Diagnosis not present

## 2022-06-03 DIAGNOSIS — E1159 Type 2 diabetes mellitus with other circulatory complications: Secondary | ICD-10-CM | POA: Diagnosis not present

## 2022-06-03 DIAGNOSIS — E782 Mixed hyperlipidemia: Secondary | ICD-10-CM | POA: Diagnosis not present

## 2022-06-03 DIAGNOSIS — I10 Essential (primary) hypertension: Secondary | ICD-10-CM | POA: Diagnosis not present

## 2022-06-11 DIAGNOSIS — Z86010 Personal history of colonic polyps: Secondary | ICD-10-CM | POA: Diagnosis not present

## 2022-06-11 DIAGNOSIS — C5700 Malignant neoplasm of unspecified fallopian tube: Secondary | ICD-10-CM | POA: Diagnosis not present

## 2022-06-11 DIAGNOSIS — K9089 Other intestinal malabsorption: Secondary | ICD-10-CM | POA: Diagnosis not present

## 2022-06-15 ENCOUNTER — Ambulatory Visit: Payer: Medicare HMO | Admitting: Internal Medicine

## 2022-06-16 DIAGNOSIS — Z7984 Long term (current) use of oral hypoglycemic drugs: Secondary | ICD-10-CM | POA: Diagnosis not present

## 2022-06-16 DIAGNOSIS — I10 Essential (primary) hypertension: Secondary | ICD-10-CM | POA: Diagnosis not present

## 2022-06-16 DIAGNOSIS — I2510 Atherosclerotic heart disease of native coronary artery without angina pectoris: Secondary | ICD-10-CM | POA: Diagnosis not present

## 2022-06-16 DIAGNOSIS — E1159 Type 2 diabetes mellitus with other circulatory complications: Secondary | ICD-10-CM | POA: Diagnosis not present

## 2022-06-16 DIAGNOSIS — E559 Vitamin D deficiency, unspecified: Secondary | ICD-10-CM | POA: Diagnosis not present

## 2022-06-16 DIAGNOSIS — Z6836 Body mass index (BMI) 36.0-36.9, adult: Secondary | ICD-10-CM | POA: Diagnosis not present

## 2022-06-16 DIAGNOSIS — E782 Mixed hyperlipidemia: Secondary | ICD-10-CM | POA: Diagnosis not present

## 2022-06-22 ENCOUNTER — Encounter: Payer: Self-pay | Admitting: Internal Medicine

## 2022-06-22 ENCOUNTER — Ambulatory Visit: Payer: Medicare HMO | Attending: Internal Medicine | Admitting: Internal Medicine

## 2022-06-22 VITALS — BP 142/72 | HR 62 | Ht 59.0 in | Wt 187.0 lb

## 2022-06-22 DIAGNOSIS — I2510 Atherosclerotic heart disease of native coronary artery without angina pectoris: Secondary | ICD-10-CM | POA: Diagnosis not present

## 2022-06-22 NOTE — Progress Notes (Signed)
Cardiology Office Note:    Date:  06/22/2022   ID:  Kristin Webb, DOB 23-Sep-1945, MRN [id number]  PCP:  Cari Caraway, MD   [facility] Cardiologist:  Janina Mayo, MD     Referring MD: Cari Caraway, MD   No chief complaint on file. CAD  History of Present Illness:    Kristin Webb is a 76 y.o. female with a hx anxiety with PTSD,  ovarian cancer on bevacizumab Day 1, cycle 23 05/23/2021  referred for LAD dx noted on CT, DMII on metformin here for CVD risk assessment.  She notes shortness of breath for awhile since gaining weight. No angina. She has fibromyalgia and has pain all over and pain in the legs. She has OSA and CPAP.  Pleuritic right rib pain with coughing and sneezing. She denies significant lower extremity edema, PND or orthopnea. No palpitations,no syncope. She feels out of balance with standing. No falls. She has no CVD hx. She has no stress test or LHC. She has worked with a Automotive engineer.   CVD Risk/Equivalent: HLD- on crestor HTN- Yes PAD- No DMII- yes,A1c 6.1 % on metformin Smoker-No Stroke-No Premature Family History- No    Interim Hx 12/18/2021: She returns for follow-up. She continues on bevacizumab. She  has no signs of relapse. Her thrombocytopenia is stable, plt >50K.  She was just started on zetia.  Interim Hx 06/22/2022 Stopped  bevacizumab infusions. No progression of ovarian CA, planned for surveillance. She started repatha in September. Is not entirely sure she is taking zetia. Her LDL we have does not seem accurate.  CT Scan 04/10/2021 Aortic atherosclerosis, in addition to at least left anterior descending coronary artery disease. Assessment for potential risk factor modification, dietary therapy or pharmacologic therapy may be warranted, if clinically indicated.      Cardiology Hx/Studies  07/11/2015 - Left ventricle: The cavity size was normal. Wall thickness was    increased in a pattern of mild LVH. Systolic function was  normal.    The estimated ejection fraction was in the range of 55% to 60%.    Wall motion was normal; there were no regional wall motion    abnormalities.  - Mitral valve: Mildly calcified annulus. There was mild    regurgitation.  - Left atrium: The atrium was mildly dilated.   TTE 06/18/2021 Normal LV function and GLS. Normal RV function Mild MR No evidence of pulmonary htn   Cardio Oncology Therapy exposure- s/p 6 cycles of paclitaxel and carboplatin 2014 10/22/2020- s/p hysterectomy and oophorectomy, no mets Bevacizumab 01/29/2020-;no metastasis  Past Medical History:  Diagnosis Date   Burning mouth syndrome    Chronic fatigue    Constipation    Diabetes ([place]) 05/09/2018   Diarrhea    in the past after gallbladder removal   Fibromyalgia    GERD (gastroesophageal reflux disease)    History of kidney stones 07/2015   Hyperlipidemia    Hypertension    borderline not on meds    Insomnia secondary to depression with anxiety    Lumbar disc disease    Ovarian cancer ([place]) 2014/2020   met nodule in abd   Pelvic mass in female    Pneumonia    hx of pneumonia as an infant   PONV (postoperative nausea and vomiting)    pain from gas hernia 2017   Shortness of breath    with exertion    Sleep apnea    uses CPAP   Yeast infection  Past Surgical History:  Procedure Laterality Date   ABDOMINAL HYSTERECTOMY     early 79s   APPENDECTOMY     WITH DEBULKING/BSO   CHOLECYSTECTOMY     early 22s   CYSTOSCOPY W/ URETERAL STENT PLACEMENT Left 07/09/2015   DUE TO NEPHROLITHIASIS Procedure: CYSTOSCOPY WITH LEFT RETROGRADE PYELOGRAM/ LEFT URETERAL STENT PLACEMENT;  Surgeon: Ardis Hughs, MD;  Location: [facility];  Service: Urology;  Laterality: Left;   HERNIA REPAIR     INCISIONAL HERNIA REPAIR N/A 12/25/2015   WITH MESH Procedure:  INCISIONAL HERNIA REPAIR;  Surgeon: Ralene Ok, MD;  Location: [facility];  Service: General;  Laterality: N/A;   [place]  N/A 10/21/2018   Procedure: LAPAROSCOPIC INCISIONAL HERNIA REPAIR WITH MESH;  Surgeon: Ralene Ok, MD;  Location: [facility];  Service: General;  Laterality: N/A;   INSERTION OF MESH N/A 12/25/2015   Procedure: INSERTION OF MESH;  Surgeon: Ralene Ok, MD;  Location: [facility];  Service: General;  Laterality: N/A;   LAPAROTOMY Bilateral 11/08/2012   Procedure: EXPLORATORY LAPAROTOMY BILATERAL SALPINGO OOPHORECTOMY TUMOR DEBULKING ;  Surgeon: Imagene Gurney A. Alycia Rossetti, MD;  Location: [facility];  Service: Gynecology;  Laterality: Bilateral;  APPENDECTOMY / OMENTECTOMY   LAPAROTOMY N/A 12/25/2015   Procedure: EXPLORATORY LAPAROTOMY;  Surgeon: Ralene Ok, MD;  Location: [facility];  Service: General;  Laterality: N/A;   LYSIS OF ADHESION N/A 12/25/2015   Procedure: LYSIS OF ADHESION;  Surgeon: Ralene Ok, MD;  Location: [facility];  Service: General;  Laterality: N/A;   PARTIAL HYSTERECTOMY  Oct 12, 2004   TRIGGER FINGER RELEASE      Current Medications: No outpatient medications have been marked as taking for the 06/22/22 encounter (Appointment) with Janina Mayo, MD.     Allergies:   Ambien [zolpidem tartrate], Bee venom, and Cortisone   Social History   Socioeconomic History   Marital status: Widowed    Spouse name: [name]   Number of children: 2   Years of education: Master's   Highest education level: Not on file  Occupational History   Not on file  Tobacco Use   Smoking status: Never   Smokeless tobacco: Never  Vaping Use   Vaping Use: Never used  Substance and Sexual Activity   Alcohol use: No   Drug use: No   Sexual activity: Not on file  Other Topics Concern   Not on file  Social History Narrative   Patient is married [name]). Husband passed away in 12-Oct-2014   Patient has 2 children by birth and 13 children all together.   Patient has a Oceanographer.   Patient is right-handed.   Patient drinks very little caffeine.         Social Determinants of Health   Financial Resource Strain: Not  on file  Food Insecurity: Not on file  Transportation Needs: Not on file  Physical Activity: Not on file  Stress: Not on file  Social Connections: Not on file     Family History: The patient's family history includes High Cholesterol in her mother; High blood pressure in her mother; Kidney disease in an other family member; Lung cancer (age of onset: 74) in her father; Lung cancer (age of onset: 67) in her mother; Parkinson's disease in her father.  ROS:   Please see the history of present illness.     All other systems reviewed and are negative.  EKGs/Labs/Other Studies Reviewed:    The following studies were reviewed today:   EKG:  EKG is  ordered today.  The ekg ordered today demonstrates   NSR, non specific ST-T changes  NSR, poor r wave progression  Recent Labs: 04/07/2022: ALT 34; BUN 17; Creatinine, Ser 0.65; Hemoglobin 14.0; Platelets 131; Potassium 3.8; Sodium 139  Recent Lipid Panel No results found for: "CHOL", "TRIG", "HDL", "CHOLHDL", "VLDL", "LDLCALC", "LDLDIRECT"   Risk Assessment/Calculations:     ASCVD 39% High LDL 86 TC 171   Physical Exam:    VS:  Vitals:   06/22/22 1000  BP: (!) 142/72  Pulse: 62  SpO2: 97%      Wt Readings from Last 3 Encounters:  04/28/22 187 lb 9.6 oz (85.1 kg)  04/21/22 185 lb 3.2 oz (84 kg)  04/07/22 190 lb 12.8 oz (86.5 kg)     GEN:  Well nourished, well developed in no acute distress.  HEENT: Normal NECK: No JVD; No carotid bruits CARDIAC: RRR, no murmurs, rubs, gallops RESPIRATORY:  Clear to auscultation without rales, wheezing or rhonchi  ABDOMEN: Soft, non-tender, non-distended MUSCULOSKELETAL:  No edema; No deformity  SKIN: Warm and dry NEUROLOGIC:  Alert and oriented x 3 PSYCHIATRIC:  Normal affect   ASSESSMENT:    #LAD CAC: see some distal disease noted, she is asymptomatic. Her echocardiogram was normal.  Continue zetia and repatha. Since labs conducted with her PCP, will defer to her for LDL  screening. Her LDL goal is < 70 mg/dL. She does not have any new symptoms. If progressive DOE or chest pressure with minimal activity, can consider LHC.  #HTN: well controlled SBP 130s ambulatory.Continue current regimen  #Cardio-Oncology- For bevacizumab.One meta analysis noted risk for cardiac and cerebral ischemia as well as hypertension in patients especially with high dosing;Totzeck et al. Ronalee Belts 2017; she is now post therapy and a cancer survivor  PLAN:    In order of problems listed above:   Follow up in 12 months      Medication Adjustments/Labs and Tests Ordered: Current medicines are reviewed at length with the patient today.  Concerns regarding medicines are outlined above.    Signed, Janina Mayo, MD  06/22/2022 9:51 AM    [facility]

## 2022-06-22 NOTE — Patient Instructions (Signed)
Medication Instructions:  No Changes In Medications at this time.  *If you need a refill on your cardiac medications before your next appointment, please call your pharmacy*  Lab Work: None Ordered At This Time.  If you have labs (blood work) drawn today and your tests are completely normal, you will receive your results only by: [place] (if you have MyChart) OR A paper copy in the mail If you have any lab test that is abnormal or we need to change your treatment, we will call you to review the results.  Testing/Procedures: None Ordered At This Time.   Follow-Up: At [facility], you and your health needs are our priority.  As part of our continuing mission to provide you with exceptional heart care, we have created designated Provider Care Teams.  These Care Teams include your primary Cardiologist (physician) and Advanced Practice Providers (APPs -  Physician Assistants and Nurse Practitioners) who all work together to provide you with the care you need, when you need it.  Your next appointment:   1 year(s)  The format for your next appointment:   In Person  Provider:   Janina Mayo, MD

## 2022-07-01 ENCOUNTER — Other Ambulatory Visit: Payer: Self-pay | Admitting: Neurology

## 2022-07-06 DIAGNOSIS — E1159 Type 2 diabetes mellitus with other circulatory complications: Secondary | ICD-10-CM | POA: Diagnosis not present

## 2022-07-06 DIAGNOSIS — I10 Essential (primary) hypertension: Secondary | ICD-10-CM | POA: Diagnosis not present

## 2022-07-06 DIAGNOSIS — E782 Mixed hyperlipidemia: Secondary | ICD-10-CM | POA: Diagnosis not present

## 2022-07-13 DIAGNOSIS — Z09 Encounter for follow-up examination after completed treatment for conditions other than malignant neoplasm: Secondary | ICD-10-CM | POA: Diagnosis not present

## 2022-07-13 DIAGNOSIS — Z86010 Personal history of colonic polyps: Secondary | ICD-10-CM | POA: Diagnosis not present

## 2022-07-13 DIAGNOSIS — D175 Benign lipomatous neoplasm of intra-abdominal organs: Secondary | ICD-10-CM | POA: Diagnosis not present

## 2022-07-13 DIAGNOSIS — K648 Other hemorrhoids: Secondary | ICD-10-CM | POA: Diagnosis not present

## 2022-07-13 DIAGNOSIS — D128 Benign neoplasm of rectum: Secondary | ICD-10-CM | POA: Diagnosis not present

## 2022-07-13 DIAGNOSIS — K644 Residual hemorrhoidal skin tags: Secondary | ICD-10-CM | POA: Diagnosis not present

## 2022-07-15 DIAGNOSIS — D128 Benign neoplasm of rectum: Secondary | ICD-10-CM | POA: Diagnosis not present

## 2022-07-24 ENCOUNTER — Encounter: Payer: Self-pay | Admitting: Surgery

## 2022-07-27 ENCOUNTER — Other Ambulatory Visit: Payer: Self-pay

## 2022-07-27 ENCOUNTER — Inpatient Hospital Stay: Payer: Medicare HMO | Attending: Gynecologic Oncology | Admitting: Psychiatry

## 2022-07-27 ENCOUNTER — Inpatient Hospital Stay: Payer: Medicare HMO | Admitting: Psychiatry

## 2022-07-27 VITALS — BP 135/66 | HR 73 | Temp 98.0°F | Resp 14 | Ht 59.45 in | Wt 188.4 lb

## 2022-07-27 DIAGNOSIS — C5701 Malignant neoplasm of right fallopian tube: Secondary | ICD-10-CM | POA: Diagnosis not present

## 2022-07-27 DIAGNOSIS — Z8544 Personal history of malignant neoplasm of other female genital organs: Secondary | ICD-10-CM | POA: Insufficient documentation

## 2022-07-27 DIAGNOSIS — C569 Malignant neoplasm of unspecified ovary: Secondary | ICD-10-CM

## 2022-07-27 DIAGNOSIS — Z90722 Acquired absence of ovaries, bilateral: Secondary | ICD-10-CM | POA: Insufficient documentation

## 2022-07-27 DIAGNOSIS — Z9221 Personal history of antineoplastic chemotherapy: Secondary | ICD-10-CM | POA: Diagnosis not present

## 2022-07-27 DIAGNOSIS — C5700 Malignant neoplasm of unspecified fallopian tube: Secondary | ICD-10-CM

## 2022-07-27 NOTE — Progress Notes (Signed)
Gynecologic Oncology Return Clinic Visit  Date of Service: 07/27/2022 Referring Provider: [name], MD  Assessment & Plan: Kristin Webb is a 76 y.o. woman with recurrent platinum sensitive fallopian tube carcinoma, recurrence treated with carboplatin/paclitaxel (completed 12/2019) followed by maintenance bevacizumab, recently discontinued 04/2022. She presents today for surveillance.   Fallopian tube carcinoma: - NED on exam today. - Signs/symptoms of recurrence reviewed. - Last imaging reviewed from 04/2022.  - CA125 does not appear to be a sensitive marker for her, so we will plan follow-up with imaging, next in approximately 3 months prior to follow-up with Dr. Alvy Bimler. Could space out if continues to be NED off maintenance therapy.  - Reviewed plan for q3 month surveillance visits, alternating between me and Dr. Alvy Bimler.   RTC 3 months with Dr. Alvy Bimler, 6 months with Gyn Onc.  Bernadene Bell, MD Gynecologic Oncology   Medical Decision Making I personally spent TOTAL 28 minutes face-to-face and non-face-to-face in the care of this patient, which includes all pre, intra, and post visit time on the date of service.  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  Interval development of large mid abdominal mass highly concerning for right  ovarian cancer.  There is mild omental nodularity on the left, and peritoneal disease cannot be completely excluded.  There is no ascites or other evidence of metastatic disease. 2.  Mild associated renal pelvocaliectasis bilaterally without obstruction.  Nonobstructing left renal calculus and a small right renal angiomyolipoma noted incidentally.   11/08/2012 Pathology Results   1. 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 - BENIGN FALLOPIAN TUBE WITH NONSPECIFIC CHRONIC INFLAMMATION. 2. Ovary and fallopian tube, left - BENIGN OVARY; NEGATIVE FOR ATYPIA OR MALIGNANCY. - BENIGN FALLOPIAN TUBE; NEGATIVE FOR ATYPIA OR MALIGNANCY. 3. Omentum, resection for tumor - HIGH GRADE CARCINOMA, SEE COMMENT. 4. Appendix, Other than Incidental - FIBROUS OBLITERATION OF APPENDICEAL TIP. - NEGATIVE FOR MALIGNANCY.   11/08/2012 Surgery   Surgery: Exploratory laparotomy, bilateral salpingo-oophorectomy, appendectomy, infacolic omentectomy, optimal debulking   Surgeons:  Paola A. Alycia Rossetti, MD; Lahoma Crocker, MD    Assistant: Caswell Corwin  Pathology: Bilateral fallopian tubes and ovaries to pathology. Appendix as well as omentum. 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   Operative findings: 25 cm right adnexal mass with smooth surface. Surgically absent uterus. Atrophic-appearing left ovary. Normal appearing appendix. Within the omentum there were centimeter nodules scattered throughout the omentum. The remainder of the surfaces were benign.   12/08/2012 Procedure   Impression:  Placement of a subcutaneous port device.  The catheter tip is in the lower SVC and ready to be used.     12/13/2012 - 03/28/2013 Chemotherapy   s/p 6 cycles of paclitaxel and carboplatin   12/13/2012  - 03/28/2013 Chemotherapy   The patient  had 6 cycles of carboplatin and Taxol   03/24/2013 Imaging   US abdomen   04/24/2013 Imaging   CT abdomen Interval resection of the large right pelvic and lower abdominal mass lesion with apparent omentectomy.  No evidence for intraperitoneal free fluid on today's study.  No discernible peritoneal lesions.   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   09/07/2013 Genetic Testing   Patient has genetic testing done for BRCA1/2 panel Results revealed patient has no mutation(s):   07/09/2015 Imaging   CT abdomen 1. 12 mm obstructive calculus at the left ureteropelvic junction with moderate proximal hydronephrosis. 2. 2 small supraumbilical ventral hernias, one containing a short segment of the mid transverse colon and the other containing a short segment of the mid small bowel. There is no associated evidence to suggest bowel incarceration or obstruction at this time. 3. Tiny locule of gas non dependently in the lumen of the urinary bladder. This is presumably iatrogenic related to recent catheterization for urinalysis. Alternatively, this could be seen in the setting of urinary tract infection with gas-forming organisms. Clinical correlation for history of recent catheterization is recommended. 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 5. Status post cholecystectomy. 6. Additional incidental findings, as above.     12/11/2016 Imaging   Ct abdomen 1. No evidence of metastatic ovarian cancer. 2. Recurrent subxiphoid ventral abdominal wall hernia containing transverse colon. No evidence of incarceration or obstruction. 3. Stable incidental findings in the liver and kidneys. No recurrent urinary tract calculus. 4. Progressive lower lumbar spondylosis. 5.  Aortic Atherosclerosis (ICD10-I70.0).     08/30/2017 Imaging   MRI thoracic spine 1. At T5-6 there is a small central disc protrusion contacting the ventral thoracic spinal  cord. No central canal or foraminal stenosis. 2. At T9-10 there is a small right paracentral disc protrusion. 3.  No acute osseous injury of the thoracic spine. 4. No aggressive osseous lesion to suggest metastatic disease.   09/24/2017 Tumor Marker   Patient's tumor was tested for the following markers: CA-125 Results of the tumor marker test revealed 21.7   09/30/2017 Imaging   CT abdomen 1. New small clustered soft tissue nodules in the left lower quadrant in the sigmoid mesentery, largest 1.0 cm, which could represent recurrent peritoneal tumor implants. No ascites.  2. Midline high ventral abdominal wall hernia containing a portion of the transverse colon is mildly increased in size, and without bowel complication at this time. 3. Chronic findings include: Aortic Atherosclerosis (ICD10-I70.0). Diffuse hepatic steatosis. 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   10/11/2017 PET scan   1. Nodules in the sigmoid mesentery are hypermetabolic and highly worrisome for metastatic disease. 2. Attic steatosis.     11/03/2017 Procedure   Successful CT-guided rectus abdominal muscle mass core biopsy. Path: - FOREIGN BODY GIANT CELL REACTION INVOLVING FIBROADIPOSE TISSUE AND SKELETAL MUSCLE. - NO EVIDENCE OF MALIGNANCY.   11/04/2017 Cancer Staging   Staging form: Fallopian Tube, AJCC 7th Edition - Clinical: FIGO Stage IIIC, calculated as Stage IV (rT3, N0, M1) - Signed by [name], MD on 08/09/2019   02/2018 Imaging   CT: 1. Continued increase in size small peritoneal nodules along the mesenteric border of the proximal sigmoid colon as well as along the serosal surface of the proximal sigmoid colon. 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 2. No evidence of distant disease. 3. Stable large ventral hernia.   05/2018 Imaging  PET: 1. Redemonstration of hypermetabolic nodules within the sigmoid mesocolon.  Mild response to therapy relative to CT of 02/24/2018. Mixed response to therapy compared to the most recent PET of 10/11/2017. 2. Hypermetabolism within the right pelvic rectus musculature, increased since the prior PET.  Clinical service requested comparison to the 11/24/2017 CT. Index 10 mm nodule within the sigmoid mesocolon was similar to the 02/24/2018 CT, and as described on that exam, increased from 7 mm on 11/24/2017. More inferior nodule within the mesocolon measures 12 mm today on image 156/4 and 8 mm on 11/24/2017.   05/2018 Imaging   CT: IMPRESSION: 1. Since 02/24/2018, decreased size of peritoneal nodules centered in the sigmoid mesocolon. 2. No evidence of new or progressive disease. 3. Hepatic steatosis. 4. Subcentimeter right renal angiomyolipoma, similar. 5.  Aortic Atherosclerosis (ICD10-I70.0). 6. Ventral abdominal wall laxity containing transverse colon, similar.   08/2018 Imaging   CT: IMPRESSION: 1. Nodules within the sigmoid mesocolon have decreased in size compared to prior. No new peritoneal or omental nodularity. 2. No evidence local recurrence the pelvis. 3. Ventral hernia contains a segment of transverse colon. No change from prior.     06/19/2019 Imaging   1. No evidence of metastatic disease in the abdomen pelvis. 2. No peritoneal or omental metastasis identified.  No free fluid. 3. Postcholecystectomy and hysterectomy. Comparison exams are made available. Comparison CT 09/08/2018 and 05/27/2018. PET-CT 05/27/2018    There is a nodule within the proximal aspect of the sigmoid colon measuring 2.2 by 2.2 cm. In comparison to prior CTs and PET-CT there was a hypermetabolic nodule at this location on the PET-CT of 08/27/2017 and on the CT of 09/08/2018 there was a small residual nodule. At that time (09/08/2018) the nodule measured 1.4 by 1.3 cm. Therefore this nodule has increased in the interval and concerning for recurrence of a serosal implant. The previous  described lymph nodes in the sigmoid mesocolon and more central mesentery are not increased in size and not pathologic by size criteria.    Concern for recurrence of serosal metastasis in the proximal sigmoid colon with a 2 cm enlarging lesion. Lesion extends into the lumen. No evidence of high-grade obstruction. Consider FDG PET scan and/or potential colonoscopy for evaluation.   06/29/2019 PET scan   1. Enlarging serosal implant within the proximal sigmoid colon has intense metabolic activity most consistent with malignancy. Lesion exhibits a somewhat indolent progression as present on PET-CT scan from 10/11/2017 and 05/27/2018. 2. Focal hypermetabolic activity within the RIGHT rectus muscle just off midline is slightly decreased from comparison exam. This may be benign inflammation related to prior laparotomy, however malignancy not excluded.   07/26/2019 Procedure   She had colonoscopy which showed multiple polyps.  6 polyps were removed from the cecum, measuring 3 to 6 mm in size.  One 12 mm polyp was removed from ascending colon and another 1 measures 7 mm.  One 5 mm polyp was removed from the transverse colon.  There is tumor noted in the sigmoid colon, approximately 35 cm from the anus which was biopsied.  The tumor appeared to be fungating, infiltrative and ulcerated but nonobstructive.  It encompassed approximately one third of the circumference of the lumen.   07/26/2019 Pathology Results   Multiple polyps came back tubular adenomas.  2 ascending polyps came back sessile serrated adenoma months.  Sigmoid colon biopsy confirmed metastatic high-grade serous carcinoma.  The morphology and immunophenotype are consistent with metastatic high-grade serous carcinoma from tubal/ovarian primary site.  08/17/2019 - 12/12/2019 Chemotherapy   The patient had carboplatin and taxol   10/23/2019 Imaging   1. Sigmoid lesion with diminished size/conspicuity difficult to measure on the previous exam.  Adjacent lymph nodes and nodularity less than a cm, largest on coronal image 48 measuring 5 mm. 2. Right rectus muscle with some thickening with mildly convex margin seen posteriorly on image 72 of series 2. This could be due to postsurgical change, however, metastatic disease is not excluded. Attention on follow-up is suggested. 3. No evidence for new metastatic disease. 4. Small hiatal hernia. 5. 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   Aortic Atherosclerosis (ICD10-I70.0).   01/01/2020 Imaging   1. No signs of new disease. 2. Tiny lymph nodes in the area of concern in the LEFT lower quadrant z. 3. Added density and subtle contour irregularity involving the rectus muscles best seen on sagittal images, associated with site of prior surgical incision just to the RIGHT of midline (image 72, series 2 and image 58, series 5. Not significantly changed compared to prior studies. This measures approximately 2.1 x 1 cm in the sagittal plane and is less well-defined in the axial plane. Potentially postoperative change. Attention on follow-up. 4. Aortic atherosclerosis.   Aortic Atherosclerosis (ICD10-I70.0).       01/29/2020 - 04/07/2022 Chemotherapy   Patient is on Treatment Plan : Ovarian Bevacizumab     05/06/2020 Imaging   1. Status post hysterectomy and oophorectomy. 2. No specific findings of recurrent or metastatic disease in the abdomen or pelvis. 3. No change in postoperative appearance of low midline incision. 4. Unchanged small prominent subcentimeter left iliac lymph nodes.   10/22/2020 Imaging   1. Status post hysterectomy and oophorectomy. No evidence of recurrent or metastatic disease within the abdomen or pelvis. 2. No significant change in the subcentimeter prominent left iliac lymph nodes.   04/11/2021 Imaging   1. No findings to suggest recurrent or metastatic disease in the abdomen or pelvis. 2. Aortic atherosclerosis, in addition to at least left anterior descending  coronary artery disease. Assessment for potential risk factor modification, dietary therapy or pharmacologic therapy may be warranted, if clinically indicated. 3. Additional incidental findings, as above.   10/15/2021 Imaging   Stable exam. No evidence of recurrent or metastatic carcinoma within the abdomen or pelvis.   Stable tiny benign right renal angiomyolipoma.   Aortic Atherosclerosis (ICD10-I70.0).     04/20/2022 Imaging   1. Stable exam. No new or progressive interval findings to suggest recurrent or metastatic disease. 2. Stable tiny benign right adrenal angiomyolipoma. 3.  Aortic Atherosclerois (ICD10-170.0)     Interval History: Patient presents today alone for surveillance.  She was last seen by Dr. Alvy Bimler at 04/21/2022 at which time, following discussion, decision was made to discontinue maintenance Bev.  Today, patient denies any pelvic or abdominal pain.  She reports chronic diarrhea for which she takes colestipol.  She underwent a colonoscopy on 07/13/2022 which noted an 8 mm polyp which was biopsied from the ascending colon and a 3 mm polyp from the rectum which was removed.  She was recommended to repeat colonoscopy in 3 to 5 years.  She otherwise notes vulvar itching which comes and goes pending the frequency of her diarrhea.  She reports that when her diarrhea is controlled she does not have any itching.  She otherwise notes weight gain and denies nausea, vomiting, new bloating.  Past Medical/Surgical History: Past Medical History:  Diagnosis Date   Burning mouth syndrome  Chronic fatigue    Constipation    Diabetes ([place]) 05/09/2018   Diarrhea    in the past after gallbladder removal   Fibromyalgia    GERD (gastroesophageal reflux disease)    History of kidney stones 07/2015   Hyperlipidemia    Hypertension    borderline not on meds    Insomnia secondary to depression with anxiety    Lumbar disc disease    Ovarian cancer ([place]) 2014/2020   met nodule in abd    Pelvic mass in female    Pneumonia    hx of pneumonia as an infant   PONV (postoperative nausea and vomiting)    pain from gas hernia 06-Oct-2015   Shortness of breath    with exertion    Sleep apnea    uses CPAP   Yeast infection     Past Surgical History:  Procedure Laterality Date   ABDOMINAL HYSTERECTOMY     early 49s   APPENDECTOMY     WITH DEBULKING/BSO   CHOLECYSTECTOMY     early 51s   CYSTOSCOPY W/ URETERAL STENT PLACEMENT Left 07/09/2015   DUE TO NEPHROLITHIASIS Procedure: CYSTOSCOPY WITH LEFT RETROGRADE PYELOGRAM/ LEFT URETERAL STENT PLACEMENT;  Surgeon: Ardis Hughs, MD;  Location: [facility];  Service: Urology;  Laterality: Left;   HERNIA REPAIR     INCISIONAL HERNIA REPAIR N/A 12/25/2015   WITH MESH Procedure:  INCISIONAL HERNIA REPAIR;  Surgeon: Ralene Ok, MD;  Location: [facility];  Service: General;  Laterality: N/A;   [place] N/A 10/21/2018   Procedure: LAPAROSCOPIC INCISIONAL HERNIA REPAIR WITH MESH;  Surgeon: Ralene Ok, MD;  Location: [facility];  Service: General;  Laterality: N/A;   INSERTION OF MESH N/A 12/25/2015   Procedure: INSERTION OF MESH;  Surgeon: Ralene Ok, MD;  Location: [facility];  Service: General;  Laterality: N/A;   LAPAROTOMY Bilateral 11/08/2012   Procedure: EXPLORATORY LAPAROTOMY BILATERAL SALPINGO OOPHORECTOMY TUMOR DEBULKING ;  Surgeon: Imagene Gurney A. Alycia Rossetti, MD;  Location: [facility];  Service: Gynecology;  Laterality: Bilateral;  APPENDECTOMY / OMENTECTOMY   LAPAROTOMY N/A 12/25/2015   Procedure: EXPLORATORY LAPAROTOMY;  Surgeon: Ralene Ok, MD;  Location: [facility];  Service: General;  Laterality: N/A;   LYSIS OF ADHESION N/A 12/25/2015   Procedure: LYSIS OF ADHESION;  Surgeon: Ralene Ok, MD;  Location: [facility];  Service: General;  Laterality: N/A;   PARTIAL HYSTERECTOMY  10/05/2004   TRIGGER FINGER RELEASE      Family History  Problem Relation Age of Onset   Lung cancer Mother 58   High blood pressure Mother    High  Cholesterol Mother    Lung cancer Father 60       2 ppd smoker   Parkinson's disease Father    Kidney disease Other     Social History   Socioeconomic History   Marital status: Widowed    Spouse name: [name]   Number of children: 2   Years of education: Master's   Highest education level: Not on file  Occupational History   Not on file  Tobacco Use   Smoking status: Never   Smokeless tobacco: Never  Vaping Use   Vaping Use: Never used  Substance and Sexual Activity   Alcohol use: No   Drug use: No   Sexual activity: Not on file  Other Topics Concern   Not on file  Social History Narrative   Patient is married [name]). Husband passed away in 10-05-2014   Patient has 2 children  by birth and 13 children all together.   Patient has a Oceanographer.   Patient is right-handed.   Patient drinks very little caffeine.         Social Determinants of Health   Financial Resource Strain: Not on file  Food Insecurity: Not on file  Transportation Needs: Not on file  Physical Activity: Not on file  Stress: Not on file  Social Connections: Not on file    Current Medications:  Current Outpatient Medications:    acetaminophen (TYLENOL) 500 MG tablet, Take 500 mg by mouth every 6 (six) hours as needed for mild pain., Disp: , Rfl:    amLODipine (NORVASC) 10 MG tablet, Take 1 tablet (10 mg total) by mouth daily., Disp: 30 tablet, Rfl: 11   B Complex-C (B-COMPLEX WITH VITAMIN C) tablet, Take 1 tablet by mouth daily., Disp: , Rfl:    busPIRone (BUSPAR) 15 MG tablet, Take 15 mg by mouth 2 (two) times daily., Disp: , Rfl:    calcium carbonate (OSCAL) 1500 (600 Ca) MG TABS tablet, Take by mouth daily with breakfast., Disp: , Rfl:    chlorthalidone (HYGROTON) 25 MG tablet, Take 25 mg by mouth daily., Disp: , Rfl:    cholecalciferol (VITAMIN D3) 25 MCG (1000 UT) tablet, Take 1,000 Units by mouth daily., Disp: , Rfl:    colestipol (COLESTID) 1 g tablet, Take 2 g by mouth at bedtime. 1 tablet in the  evening, 1/2 tablet in the morning, Disp: , Rfl:    Evolocumab (REPATHA SURECLICK) 509 MG/ML SOAJ, Inject 1 mL into the skin every 14 (fourteen) days., Disp: 2 mL, Rfl: 11   FIBER PO, Take by mouth., Disp: , Rfl:    FLUoxetine (PROZAC) 20 MG capsule, Take 40 mg by mouth every morning. Takes with a 4m capsule for her total dose of 375m, Disp: , Rfl:    loperamide (IMODIUM) 2 MG capsule, Take by mouth as needed for diarrhea or loose stools., Disp: , Rfl:    Melatonin 10 MG TABS, Take by mouth., Disp: , Rfl:    metFORMIN (GLUCOPHAGE-XR) 500 MG 24 hr tablet, Take 500 mg by mouth daily with breakfast. , Disp: , Rfl: 3   metoprolol tartrate (LOPRESSOR) 25 MG tablet, Take 1 tablet (25 mg total) by mouth 2 (two) times daily., Disp: 60 tablet, Rfl: 3   modafinil (PROVIGIL) 200 MG tablet, TAKE ONE TABLET BY MOUTH EVERY MORNING, Disp: 30 tablet, Rfl: 5   Multiple Vitamin (MULTIVITAMIN WITH MINERALS) TABS, Take 1 tablet by mouth every morning. Womens 50+, Disp: , Rfl:    rosuvastatin (CRESTOR) 40 MG tablet, , Disp: , Rfl:    telmisartan (MICARDIS) 40 MG tablet, Take 40 mg by mouth daily., Disp: , Rfl:    Eszopiclone 3 MG TABS, TAKE ONE TABLET BY MOUTH EVERYDAY AT BEDTIME, Disp: 30 tablet, Rfl: 2  Review of Symptoms: Complete 10-system review is positive for: mouth sores, cough  Physical Exam: BP 135/66 (BP Location: Left Arm, Patient Position: Sitting)   Pulse 73   Temp 98 F (36.7 C) (Oral)   Resp 14   Ht 4' 11.45" (1.51 m)   Wt 188 lb 6.4 oz (85.5 kg)   SpO2 100%   BMI 37.48 kg/m  General: Alert, oriented, no acute distress. HEENT: Normocephalic, atraumatic. Neck symmetric without masses. Sclera anicteric.  Chest: Normal work of breathing. Clear to auscultation bilaterally.   Cardiovascular: Regular rate and rhythm, no murmurs. Abdomen: Soft, nontender.  Normoactive bowel sounds.  No masses or  hepatosplenomegaly appreciated.  Well-healed incisions. Extremities: Grossly normal range of  motion.  Warm, well perfused.  No edema bilaterally. Skin: No rashes or lesions noted. Lymphatics: No cervical, supraclavicular, or inguinal adenopathy. GU: Normal appearing external genitalia without erythema, excoriation, or lesions. Area of superficial skin breakdown noted adjacent to the rectum. Speculum exam reveals atrophic vaginal mucosa.  Bimanual exam reveals smooth vaginal cuff. No nodularity or pelvic mass.  Rectovaginal exam negative. Exam chaperoned by Kimberly Martinique, [place]   Laboratory & Radiologic Studies: CT ABDOMEN PELVIS W CONTRAST 04/18/2022  Narrative CLINICAL DATA:  Ovarian cancer.  Restaging.  * Tracking Code: BO *  EXAM: CT ABDOMEN AND PELVIS WITH CONTRAST  TECHNIQUE: Multidetector CT imaging of the abdomen and pelvis was performed using the standard protocol following bolus administration of intravenous contrast.  RADIATION DOSE REDUCTION: This exam was performed according to the departmental dose-optimization program which includes automated exposure control, adjustment of the mA and/or kV according to patient size and/or use of iterative reconstruction technique.  CONTRAST:  132m OMNIPAQUE IOHEXOL 300 MG/ML  SOLN  COMPARISON:  10/15/2021  FINDINGS: Lower chest: Unremarkable.  Hepatobiliary: No suspicious focal abnormality within the liver parenchyma. 15 mm exophytic lesion medial segment left liver on 16/3 is unchanged. Gallbladder surgically absent. No intrahepatic or extrahepatic biliary dilation.  Pancreas: No focal mass lesion. No dilatation of the main duct. No intraparenchymal cyst. No peripancreatic edema.  Spleen: No splenomegaly. No focal mass lesion.  Adrenals/Urinary Tract: No adrenal nodule or mass. 10 mm angiomyolipoma interpolar right kidney is unchanged. Left kidney unremarkable. No evidence for hydroureter. The urinary bladder appears normal for the degree of distention.  Stomach/Bowel: Stomach is unremarkable. No gastric wall  thickening. No evidence of outlet obstruction. Duodenum is normally positioned as is the ligament of Treitz. No small bowel wall thickening. No small bowel dilatation. The terminal ileum is normal. The appendix is not well visualized, but there is no edema or inflammation in the region of the cecum. No gross colonic mass. No colonic wall thickening.  Vascular/Lymphatic: There is mild atherosclerotic calcification of the abdominal aorta without aneurysm. There is no gastrohepatic or hepatoduodenal ligament lymphadenopathy. No retroperitoneal or mesenteric lymphadenopathy. No pelvic sidewall lymphadenopathy.  Reproductive: Uterus surgically absent.  There is no adnexal mass.  Other: No intraperitoneal free fluid.  Musculoskeletal: Small left groin hernia contains only fat. No worrisome lytic or sclerotic osseous abnormality.  IMPRESSION: 1. Stable exam. No new or progressive interval findings to suggest recurrent or metastatic disease. 2. Stable tiny benign right adrenal angiomyolipoma. 3.  Aortic Atherosclerois (ICD10-170.0)   Electronically Signed By: EMisty StanleyM.D. On: 04/20/2022 09:42

## 2022-07-27 NOTE — Patient Instructions (Signed)
It was a pleasure to see you in clinic today. - exam is normal today - Recommend some protective cream like desitin. - Scans in 3 months before visit with Dr. Alvy Bimler. - Return visit planned for 6 months  Thank you very much for allowing me to provide care for you today.  I appreciate your confidence in choosing our Gynecologic Oncology team at [facility].  If you have any questions about your visit today please call our office or send [place] a MyChart message and we will get back to you as soon as possible.

## 2022-07-28 ENCOUNTER — Other Ambulatory Visit: Payer: Self-pay | Admitting: Neurology

## 2022-07-28 ENCOUNTER — Encounter: Payer: Self-pay | Admitting: Hematology and Oncology

## 2022-07-31 ENCOUNTER — Encounter: Payer: Self-pay | Admitting: Psychiatry

## 2022-08-26 DIAGNOSIS — E1159 Type 2 diabetes mellitus with other circulatory complications: Secondary | ICD-10-CM | POA: Diagnosis not present

## 2022-08-26 DIAGNOSIS — Z6837 Body mass index (BMI) 37.0-37.9, adult: Secondary | ICD-10-CM | POA: Diagnosis not present

## 2022-08-31 ENCOUNTER — Other Ambulatory Visit (HOSPITAL_COMMUNITY): Payer: Self-pay

## 2022-09-04 DIAGNOSIS — Z6837 Body mass index (BMI) 37.0-37.9, adult: Secondary | ICD-10-CM | POA: Diagnosis not present

## 2022-09-04 DIAGNOSIS — J029 Acute pharyngitis, unspecified: Secondary | ICD-10-CM | POA: Diagnosis not present

## 2022-09-04 DIAGNOSIS — J0140 Acute pansinusitis, unspecified: Secondary | ICD-10-CM | POA: Diagnosis not present

## 2022-09-04 DIAGNOSIS — Z03818 Encounter for observation for suspected exposure to other biological agents ruled out: Secondary | ICD-10-CM | POA: Diagnosis not present

## 2022-09-17 DIAGNOSIS — E1121 Type 2 diabetes mellitus with diabetic nephropathy: Secondary | ICD-10-CM | POA: Diagnosis not present

## 2022-09-17 DIAGNOSIS — R800 Isolated proteinuria: Secondary | ICD-10-CM | POA: Diagnosis not present

## 2022-09-17 DIAGNOSIS — R801 Persistent proteinuria, unspecified: Secondary | ICD-10-CM | POA: Diagnosis not present

## 2022-09-17 DIAGNOSIS — K529 Noninfective gastroenteritis and colitis, unspecified: Secondary | ICD-10-CM | POA: Diagnosis not present

## 2022-09-17 DIAGNOSIS — I10 Essential (primary) hypertension: Secondary | ICD-10-CM | POA: Diagnosis not present

## 2022-09-17 DIAGNOSIS — I700 Atherosclerosis of aorta: Secondary | ICD-10-CM | POA: Diagnosis not present

## 2022-09-17 DIAGNOSIS — C5700 Malignant neoplasm of unspecified fallopian tube: Secondary | ICD-10-CM | POA: Diagnosis not present

## 2022-09-17 DIAGNOSIS — Z6837 Body mass index (BMI) 37.0-37.9, adult: Secondary | ICD-10-CM | POA: Diagnosis not present

## 2022-09-17 DIAGNOSIS — Z0000 Encounter for general adult medical examination without abnormal findings: Secondary | ICD-10-CM | POA: Diagnosis not present

## 2022-09-17 DIAGNOSIS — G47419 Narcolepsy without cataplexy: Secondary | ICD-10-CM | POA: Diagnosis not present

## 2022-09-18 ENCOUNTER — Other Ambulatory Visit: Payer: Self-pay | Admitting: Family Medicine

## 2022-09-18 DIAGNOSIS — Z1231 Encounter for screening mammogram for malignant neoplasm of breast: Secondary | ICD-10-CM

## 2022-09-18 DIAGNOSIS — M85852 Other specified disorders of bone density and structure, left thigh: Secondary | ICD-10-CM

## 2022-09-30 DIAGNOSIS — I10 Essential (primary) hypertension: Secondary | ICD-10-CM | POA: Diagnosis not present

## 2022-09-30 DIAGNOSIS — E782 Mixed hyperlipidemia: Secondary | ICD-10-CM | POA: Diagnosis not present

## 2022-09-30 DIAGNOSIS — E1121 Type 2 diabetes mellitus with diabetic nephropathy: Secondary | ICD-10-CM | POA: Diagnosis not present

## 2022-10-15 ENCOUNTER — Ambulatory Visit (HOSPITAL_COMMUNITY)
Admission: RE | Admit: 2022-10-15 | Discharge: 2022-10-15 | Disposition: A | Discharge status: Home / Self Care | Payer: Medicare HMO | Source: Ambulatory Visit | Attending: Psychiatry | Admitting: Psychiatry

## 2022-10-15 DIAGNOSIS — K449 Diaphragmatic hernia without obstruction or gangrene: Secondary | ICD-10-CM | POA: Diagnosis not present

## 2022-10-15 DIAGNOSIS — K7689 Other specified diseases of liver: Secondary | ICD-10-CM | POA: Diagnosis not present

## 2022-10-15 DIAGNOSIS — J984 Other disorders of lung: Secondary | ICD-10-CM | POA: Diagnosis not present

## 2022-10-15 DIAGNOSIS — C5700 Malignant neoplasm of unspecified fallopian tube: Secondary | ICD-10-CM | POA: Diagnosis not present

## 2022-10-15 DIAGNOSIS — D1771 Benign lipomatous neoplasm of kidney: Secondary | ICD-10-CM | POA: Diagnosis not present

## 2022-10-15 DIAGNOSIS — D175 Benign lipomatous neoplasm of intra-abdominal organs: Secondary | ICD-10-CM | POA: Diagnosis not present

## 2022-10-15 DIAGNOSIS — I700 Atherosclerosis of aorta: Secondary | ICD-10-CM | POA: Insufficient documentation

## 2022-10-15 DIAGNOSIS — J9811 Atelectasis: Secondary | ICD-10-CM | POA: Insufficient documentation

## 2022-10-15 MED ORDER — SODIUM CHLORIDE (PF) 0.9 % IJ SOLN
INTRAMUSCULAR | Status: AC
  Filled 2022-10-15: qty 50

## 2022-10-15 MED ORDER — IOHEXOL 300 MG/ML  SOLN
100.0000 mL | Freq: Once | INTRAMUSCULAR | Status: AC | PRN
  Administered 2022-10-15: 100 mL via INTRAVENOUS

## 2022-10-15 MED ORDER — HEPARIN SOD (PORK) LOCK FLUSH 100 UNIT/ML IV SOLN
500.0000 [IU] | Freq: Once | INTRAVENOUS | Status: AC
  Administered 2022-10-15: 500 [IU] via INTRAVENOUS

## 2022-10-15 MED ORDER — IOHEXOL 9 MG/ML PO SOLN
ORAL | Status: AC
  Administered 2022-10-15: 1000 mL
  Filled 2022-10-15: qty 1000

## 2022-10-15 MED ORDER — IOHEXOL 9 MG/ML PO SOLN: 500.0000 mL | ORAL | Status: AC

## 2022-10-16 ENCOUNTER — Telehealth: Payer: Self-pay

## 2022-10-16 NOTE — Telephone Encounter (Signed)
-----   Message from Dorothyann Gibbs, NP sent at 10/16/2022 11:24 AM EST ----- Please let the patient know her CT scan looks stable with no new or progressive disease in the chest, abdomen, and pelvis.   They see a small hiatal hernia of the stomach-this could cause reflux-would follow with PCP for this.   They see plaque in the aorta-follow with PCP for this.   They see arthritis changes of the spine-PCP for this as well.   Please fax results to PCP as well.

## 2022-10-16 NOTE — Telephone Encounter (Signed)
Pt is aware of message below from [facility] NP, she is also aware I am faxing a copy of the report to her PCP, Cari Caraway.

## 2022-10-19 ENCOUNTER — Telehealth: Payer: Self-pay | Admitting: Hematology and Oncology

## 2022-10-19 NOTE — Telephone Encounter (Signed)
Left patient a vm regarding appointment change  

## 2022-10-20 ENCOUNTER — Inpatient Hospital Stay: Payer: Medicare HMO | Attending: Gynecologic Oncology | Admitting: Hematology and Oncology

## 2022-10-20 ENCOUNTER — Other Ambulatory Visit: Payer: Self-pay

## 2022-10-20 ENCOUNTER — Inpatient Hospital Stay: Payer: Medicare HMO

## 2022-10-20 ENCOUNTER — Encounter: Payer: Self-pay | Admitting: Hematology and Oncology

## 2022-10-20 ENCOUNTER — Inpatient Hospital Stay: Payer: Medicare HMO | Admitting: Hematology and Oncology

## 2022-10-20 VITALS — BP 138/76 | HR 61 | Temp 98.4°F | Resp 18 | Ht 59.0 in | Wt 191.0 lb

## 2022-10-20 DIAGNOSIS — Z8544 Personal history of malignant neoplasm of other female genital organs: Secondary | ICD-10-CM | POA: Insufficient documentation

## 2022-10-20 DIAGNOSIS — C5700 Malignant neoplasm of unspecified fallopian tube: Secondary | ICD-10-CM | POA: Diagnosis not present

## 2022-10-20 DIAGNOSIS — Z90710 Acquired absence of both cervix and uterus: Secondary | ICD-10-CM | POA: Diagnosis not present

## 2022-10-20 DIAGNOSIS — D696 Thrombocytopenia, unspecified: Secondary | ICD-10-CM | POA: Diagnosis not present

## 2022-10-20 DIAGNOSIS — I10 Essential (primary) hypertension: Secondary | ICD-10-CM | POA: Diagnosis not present

## 2022-10-20 DIAGNOSIS — C786 Secondary malignant neoplasm of retroperitoneum and peritoneum: Secondary | ICD-10-CM

## 2022-10-20 DIAGNOSIS — C5701 Malignant neoplasm of right fallopian tube: Secondary | ICD-10-CM

## 2022-10-20 LAB — COMPREHENSIVE METABOLIC PANEL
ALT: 27 U/L (ref 0–44)
AST: 21 U/L (ref 15–41)
Albumin: 4.3 g/dL (ref 3.5–5.0)
Alkaline Phosphatase: 94 U/L (ref 38–126)
Anion gap: 8 (ref 5–15)
BUN: 30 mg/dL — ABNORMAL HIGH (ref 8–23)
CO2: 27 mmol/L (ref 22–32)
Calcium: 9.9 mg/dL (ref 8.9–10.3)
Chloride: 105 mmol/L (ref 98–111)
Creatinine, Ser: 0.82 mg/dL (ref 0.44–1.00)
GFR, Estimated: >60 mL/min (ref >60)
Glucose, Bld: 111 mg/dL — ABNORMAL HIGH (ref 70–99)
Potassium: 4.2 mmol/L (ref 3.5–5.1)
Sodium: 140 mmol/L (ref 135–145)
Total Bilirubin: 0.4 mg/dL (ref 0.3–1.2)
Total Protein: 6.8 g/dL (ref 6.5–8.1)

## 2022-10-20 LAB — CBC WITH DIFFERENTIAL/PLATELET
Abs Immature Granulocytes: 0.02 10*3/uL (ref 0.00–0.07)
Basophils Absolute: 0 10*3/uL (ref 0.0–0.1)
Basophils Relative: 1 %
Eosinophils Absolute: 0.3 10*3/uL (ref 0.0–0.5)
Eosinophils Relative: 4 %
HCT: 39.6 % (ref 36.0–46.0)
Hemoglobin: 13 g/dL (ref 12.0–15.0)
Immature Granulocytes: 0 %
Lymphocytes Relative: 26 %
Lymphs Abs: 1.6 10*3/uL (ref 0.7–4.0)
MCH: 30.2 pg (ref 26.0–34.0)
MCHC: 32.8 g/dL (ref 30.0–36.0)
MCV: 92.1 fL (ref 80.0–100.0)
Monocytes Absolute: 0.6 10*3/uL (ref 0.1–1.0)
Monocytes Relative: 10 %
Neutro Abs: 3.6 10*3/uL (ref 1.7–7.7)
Neutrophils Relative %: 59 %
Platelets: 142 10*3/uL — ABNORMAL LOW (ref 150–400)
RBC: 4.3 MIL/uL (ref 3.87–5.11)
RDW: 13.2 % (ref 11.5–15.5)
WBC: 6.1 10*3/uL (ref 4.0–10.5)
nRBC: 0 % (ref 0.0–0.2)

## 2022-10-20 MED ORDER — HEPARIN SOD (PORK) LOCK FLUSH 100 UNIT/ML IV SOLN
500.0000 [IU] | Freq: Once | INTRAVENOUS | Status: AC
  Administered 2022-10-20: 500 [IU]

## 2022-10-20 MED ORDER — SODIUM CHLORIDE 0.9% FLUSH
10.0000 mL | Freq: Once | INTRAVENOUS | Status: AC
  Administered 2022-10-20: 10 mL

## 2022-10-20 NOTE — Assessment & Plan Note (Signed)
Her recent CT imaging is reviewed with her which showed no abnormalities We will get her port removed I plan to see her again in 6 months for further follow-up

## 2022-10-20 NOTE — Progress Notes (Signed)
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   All questions were answered. The patient knows to call the clinic with any problems, questions or concerns. The total time spent in the appointment was 20 minutes encounter with patients including review of chart and various tests results, discussions about plan of care and coordination of care plan   [name], MD 10/20/2022 11:44 AM  INTERVAL HISTORY: Please see below for problem oriented charting. she returns for surveillance follow-up She had recent CT imaging done She has no new symptoms of abdominal changes We reviewed CT imaging together  REVIEW OF SYSTEMS:   Constitutional: Denies fevers, chills or abnormal weight loss Eyes: Denies blurriness of vision Ears, nose, mouth, throat, and face: Denies mucositis or sore throat Respiratory: Denies cough, dyspnea or wheezes Cardiovascular: Denies palpitation, chest discomfort or lower extremity swelling Gastrointestinal:  Denies nausea, heartburn or change in bowel habits Skin: Denies abnormal skin rashes Lymphatics: Denies new lymphadenopathy or easy bruising Neurological:Denies numbness, tingling or new weaknesses Behavioral/Psych: Mood is stable, no new  changes  All other systems were reviewed with the patient and are negative.  I have reviewed the past medical history, past surgical history, social history and family history with the patient and they are unchanged from previous note.  ALLERGIES:  is allergic to [employer] tartrate], bee venom, and cortisone.  MEDICATIONS:  Current Outpatient Medications  Medication Sig Dispense Refill   acetaminophen (TYLENOL) 500 MG tablet Take 500 mg by mouth every 6 (six) hours as needed for mild pain.     amLODipine (NORVASC) 10 MG tablet Take 1 tablet (10 mg total) by mouth daily. 30 tablet 11   B Complex-C (B-COMPLEX WITH VITAMIN C) tablet Take 1 tablet by mouth daily.     busPIRone (BUSPAR) 15 MG tablet Take 15 mg by mouth 2 (two) times daily.     calcium carbonate (OSCAL) 1500 (600 Ca) MG TABS tablet Take by mouth daily with breakfast.     chlorthalidone (HYGROTON) 25 MG tablet Take 25 mg by mouth daily.     cholecalciferol (VITAMIN D3) 25 MCG (1000 UT) tablet Take 1,000 Units by mouth daily.     colestipol (COLESTID) 1 g tablet Take 2 g by mouth at bedtime. 1 tablet in the evening, 1/2 tablet in the morning     Eszopiclone 3 MG TABS TAKE ONE TABLET BY MOUTH EVERYDAY AT BEDTIME 30 tablet 2   Evolocumab (REPATHA SURECLICK) [id number] MG/ML SOAJ Inject 1 mL into the skin every 14 (fourteen) days. 2 mL 11   FIBER PO Take by mouth.     FLUoxetine (PROZAC) 20 MG capsule Take 40 mg by mouth every morning.     loperamide (IMODIUM) 2 MG capsule Take by mouth as needed for diarrhea or loose stools.  Melatonin 10 MG TABS Take by mouth.     metFORMIN (GLUCOPHAGE-XR) 500 MG 24 hr tablet Take 500 mg by mouth daily with breakfast.   3   metoprolol tartrate (LOPRESSOR) 25 MG tablet Take 1 tablet (25 mg total) by mouth 2 (two) times daily. 60 tablet 3   modafinil (PROVIGIL) 200 MG tablet TAKE ONE TABLET BY MOUTH EVERY MORNING 30 tablet 5   Multiple Vitamin (MULTIVITAMIN WITH MINERALS) TABS Take 1 tablet by  mouth every morning. Womens 50+     rosuvastatin (CRESTOR) 40 MG tablet      telmisartan (MICARDIS) 40 MG tablet Take 40 mg by mouth daily.     No current facility-administered medications for this visit.    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  Interval development of large mid abdominal mass highly concerning for right ovarian cancer.  There is mild omental nodularity on the left, and peritoneal disease cannot be completely excluded.  There is no ascites or other evidence of metastatic disease. 2.  Mild associated renal pelvocaliectasis bilaterally without obstruction.  Nonobstructing left renal calculus and a small right renal angiomyolipoma noted incidentally.   11/08/2012 Pathology Results   1. 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 - BENIGN FALLOPIAN TUBE WITH NONSPECIFIC CHRONIC INFLAMMATION. 2. Ovary and fallopian tube, left - BENIGN OVARY; NEGATIVE FOR ATYPIA OR MALIGNANCY. - BENIGN FALLOPIAN TUBE; NEGATIVE FOR ATYPIA OR MALIGNANCY. 3. Omentum, resection for tumor - HIGH GRADE CARCINOMA, SEE COMMENT. 4. Appendix, Other than Incidental - FIBROUS OBLITERATION OF APPENDICEAL TIP. - NEGATIVE FOR MALIGNANCY.   11/08/2012  Surgery   Surgery: Exploratory laparotomy, bilateral salpingo-oophorectomy, appendectomy, infacolic omentectomy, optimal debulking   Surgeons:  Paola A. Alycia Rossetti, MD; Lahoma Crocker, MD    Assistant: Caswell Corwin  Pathology: Bilateral fallopian tubes and ovaries to pathology. Appendix as well as omentum. 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   Operative findings: 25 cm right adnexal mass with smooth surface. Surgically absent uterus. Atrophic-appearing left ovary. Normal appearing appendix. Within the omentum there were centimeter nodules scattered throughout the omentum. The remainder of the surfaces were benign.   12/08/2012 Procedure   Impression:  Placement of a subcutaneous port device.  The catheter tip is in the lower SVC and ready to be used.     12/13/2012 - 03/28/2013 Chemotherapy   s/p 6 cycles of paclitaxel and carboplatin   12/13/2012 - 03/28/2013 Chemotherapy   The patient had 6 cycles of carboplatin and Taxol   03/24/2013 Imaging   US abdomen   04/24/2013 Imaging   CT abdomen Interval resection of the large right pelvic and lower abdominal mass lesion with apparent omentectomy.  No evidence for intraperitoneal free fluid on today's study.  No discernible peritoneal lesions.   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  09/07/2013 Genetic Testing   Patient has genetic testing done for BRCA1/2 panel Results revealed patient has no mutation(s):   07/09/2015 Imaging   CT abdomen 1. 12 mm obstructive calculus at the left ureteropelvic junction with moderate proximal hydronephrosis. 2. 2 small supraumbilical ventral hernias, one containing a short segment of the mid transverse colon and the other containing a short segment of the mid small bowel. There is no associated evidence to suggest bowel incarceration or obstruction at this time. 3. Tiny locule of gas non dependently in the lumen of  the urinary bladder. This is presumably iatrogenic related to recent catheterization for urinalysis. Alternatively, this could be seen in the setting of urinary tract infection with gas-forming organisms. Clinical correlation for history of recent catheterization is recommended. 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 5. Status post cholecystectomy. 6. Additional incidental findings, as above.     12/11/2016 Imaging   Ct abdomen 1. No evidence of metastatic ovarian cancer. 2. Recurrent subxiphoid ventral abdominal wall hernia containing transverse colon. No evidence of incarceration or obstruction. 3. Stable incidental findings in the liver and kidneys. No recurrent urinary tract calculus. 4. Progressive lower lumbar spondylosis. 5.  Aortic Atherosclerosis (ICD10-I70.0).     08/30/2017 Imaging   MRI thoracic spine 1. At T5-6 there is a small central disc protrusion contacting the ventral thoracic spinal cord. No central canal or foraminal stenosis. 2. At T9-10 there is a small right paracentral disc protrusion. 3.  No acute osseous injury of the thoracic spine. 4. No aggressive osseous lesion to suggest metastatic disease.   09/24/2017 Tumor Marker   Patient's tumor was tested for the following markers: CA-125 Results of the tumor marker test revealed 21.7   09/30/2017 Imaging   CT abdomen 1. New small clustered soft tissue nodules in the left lower quadrant in the sigmoid mesentery, largest 1.0 cm, which could represent recurrent peritoneal tumor implants. No ascites.  2. Midline high ventral abdominal wall hernia containing a portion of the transverse colon is mildly increased in size, and without bowel complication at this time. 3. Chronic findings include: Aortic Atherosclerosis (ICD10-I70.0). Diffuse hepatic steatosis. 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   10/11/2017 PET scan   1. Nodules in the sigmoid  mesentery are hypermetabolic and highly worrisome for metastatic disease. 2. Attic steatosis.     11/03/2017 Procedure   Successful CT-guided rectus abdominal muscle mass core biopsy. Path: - FOREIGN BODY GIANT CELL REACTION INVOLVING FIBROADIPOSE TISSUE AND SKELETAL MUSCLE. - NO EVIDENCE OF MALIGNANCY.   11/04/2017 Cancer Staging   Staging form: Fallopian Tube, AJCC 7th Edition - Clinical: FIGO Stage IIIC, calculated as Stage IV (rT3, N0, M1) - Signed by [name], MD on 08/09/2019   02/2018 Imaging   CT: 1. Continued increase in size small peritoneal nodules along the mesenteric border of the proximal sigmoid colon as well as along the serosal surface of the proximal sigmoid colon. 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 2. No evidence of distant disease. 3. Stable large ventral hernia.   05/2018 Imaging   PET: 1. Redemonstration of hypermetabolic nodules within the sigmoid mesocolon. Mild response to therapy relative to CT of 02/24/2018. Mixed response to therapy compared to the most recent PET of 10/11/2017. 2. Hypermetabolism within the right pelvic rectus musculature, increased since the prior PET.  Clinical service requested comparison to the 11/24/2017 CT. Index 10 mm nodule within the sigmoid mesocolon was similar  to the 02/24/2018 CT, and as described on that exam, increased from 7 mm on 11/24/2017. More inferior nodule within the mesocolon measures 12 mm today on image 156/4 and 8 mm on 11/24/2017.   05/2018 Imaging   CT: IMPRESSION: 1. Since 02/24/2018, decreased size of peritoneal nodules centered in the sigmoid mesocolon. 2. No evidence of new or progressive disease. 3. Hepatic steatosis. 4. Subcentimeter right renal angiomyolipoma, similar. 5.  Aortic Atherosclerosis (ICD10-I70.0). 6. Ventral abdominal wall laxity containing transverse colon, similar.   08/2018 Imaging   CT: IMPRESSION: 1. Nodules within the sigmoid mesocolon  have decreased in size compared to prior. No new peritoneal or omental nodularity. 2. No evidence local recurrence the pelvis. 3. Ventral hernia contains a segment of transverse colon. No change from prior.     06/19/2019 Imaging   1. No evidence of metastatic disease in the abdomen pelvis. 2. No peritoneal or omental metastasis identified.  No free fluid. 3. Postcholecystectomy and hysterectomy. Comparison exams are made available. Comparison CT 09/08/2018 and 05/27/2018. PET-CT 05/27/2018    There is a nodule within the proximal aspect of the sigmoid colon measuring 2.2 by 2.2 cm. In comparison to prior CTs and PET-CT there was a hypermetabolic nodule at this location on the PET-CT of 08/27/2017 and on the CT of 09/08/2018 there was a small residual nodule. At that time (09/08/2018) the nodule measured 1.4 by 1.3 cm. Therefore this nodule has increased in the interval and concerning for recurrence of a serosal implant. The previous described lymph nodes in the sigmoid mesocolon and more central mesentery are not increased in size and not pathologic by size criteria.    Concern for recurrence of serosal metastasis in the proximal sigmoid colon with a 2 cm enlarging lesion. Lesion extends into the lumen. No evidence of high-grade obstruction. Consider FDG PET scan and/or potential colonoscopy for evaluation.   06/29/2019 PET scan   1. Enlarging serosal implant within the proximal sigmoid colon has intense metabolic activity most consistent with malignancy. Lesion exhibits a somewhat indolent progression as present on PET-CT scan from 10/11/2017 and 05/27/2018. 2. Focal hypermetabolic activity within the RIGHT rectus muscle just off midline is slightly decreased from comparison exam. This may be benign inflammation related to prior laparotomy, however malignancy not excluded.   07/26/2019 Procedure   She had colonoscopy which showed multiple polyps.  6 polyps were removed from the cecum,  measuring 3 to 6 mm in size.  One 12 mm polyp was removed from ascending colon and another 1 measures 7 mm.  One 5 mm polyp was removed from the transverse colon.  There is tumor noted in the sigmoid colon, approximately 35 cm from the anus which was biopsied.  The tumor appeared to be fungating, infiltrative and ulcerated but nonobstructive.  It encompassed approximately one third of the circumference of the lumen.   07/26/2019 Pathology Results   Multiple polyps came back tubular adenomas.  2 ascending polyps came back sessile serrated adenoma months.  Sigmoid colon biopsy confirmed metastatic high-grade serous carcinoma.  The morphology and immunophenotype are consistent with metastatic high-grade serous carcinoma from tubal/ovarian primary site.   08/17/2019 - 12/12/2019 Chemotherapy   The patient had carboplatin and taxol   10/23/2019 Imaging   1. Sigmoid lesion with diminished size/conspicuity difficult to measure on the previous exam. Adjacent lymph nodes and nodularity less than a cm, largest on coronal image 48 measuring 5 mm. 2. Right rectus muscle with some thickening with mildly convex margin seen posteriorly  on image 72 of series 2. This could be due to postsurgical change, however, metastatic disease is not excluded. Attention on follow-up is suggested. 3. No evidence for new metastatic disease. 4. Small hiatal hernia. 5. 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   Aortic Atherosclerosis (ICD10-I70.0).   01/01/2020 Imaging   1. No signs of new disease. 2. Tiny lymph nodes in the area of concern in the LEFT lower quadrant z. 3. Added density and subtle contour irregularity involving the rectus muscles best seen on sagittal images, associated with site of prior surgical incision just to the RIGHT of midline (image 72, series 2 and image 58, series 5. Not significantly changed compared to prior studies. This measures approximately 2.1 x 1 cm in the sagittal plane and is less well-defined in  the axial plane. Potentially postoperative change. Attention on follow-up. 4. Aortic atherosclerosis.   Aortic Atherosclerosis (ICD10-I70.0).       01/29/2020 - 04/07/2022 Chemotherapy   Patient is on Treatment Plan : Ovarian Bevacizumab     05/06/2020 Imaging   1. Status post hysterectomy and oophorectomy. 2. No specific findings of recurrent or metastatic disease in the abdomen or pelvis. 3. No change in postoperative appearance of low midline incision. 4. Unchanged small prominent subcentimeter left iliac lymph nodes.   10/22/2020 Imaging   1. Status post hysterectomy and oophorectomy. No evidence of recurrent or metastatic disease within the abdomen or pelvis. 2. No significant change in the subcentimeter prominent left iliac lymph nodes.   04/11/2021 Imaging   1. No findings to suggest recurrent or metastatic disease in the abdomen or pelvis. 2. Aortic atherosclerosis, in addition to at least left anterior descending coronary artery disease. Assessment for potential risk factor modification, dietary therapy or pharmacologic therapy may be warranted, if clinically indicated. 3. Additional incidental findings, as above.   10/15/2021 Imaging   Stable exam. No evidence of recurrent or metastatic carcinoma within the abdomen or pelvis.   Stable tiny benign right renal angiomyolipoma.   Aortic Atherosclerosis (ICD10-I70.0).     04/20/2022 Imaging   1. Stable exam. No new or progressive interval findings to suggest recurrent or metastatic disease. 2. Stable tiny benign right adrenal angiomyolipoma. 3.  Aortic Atherosclerois (ICD10-170.0)     PHYSICAL EXAMINATION: ECOG PERFORMANCE STATUS: 0 - Asymptomatic  Vitals:   10/20/22 0900  BP: 138/76  Pulse: 61  Resp: 18  Temp: 98.4 F (36.9 C)  SpO2: 100%   Filed Weights   10/20/22 0900  Weight: 191 lb (86.6 kg)    GENERAL:alert, no distress and comfortable  NEURO: alert & oriented x 3 with fluent speech, no focal  motor/sensory deficits  LABORATORY DATA:  I have reviewed the data as listed    Component Value Date/Time   NA 140 10/20/2022 0835   NA 141 12/02/2016 1219   K 4.2 10/20/2022 0835   K 3.7 12/02/2016 1219   CL 105 10/20/2022 0835   CL 103 01/18/2013 1329   CO2 27 10/20/2022 0835   CO2 27 12/02/2016 1219   GLUCOSE 111 (H) 10/20/2022 0835   GLUCOSE 98 12/02/2016 1219   GLUCOSE 110 (H) 01/18/2013 1329   BUN 30 (H) 10/20/2022 0835   BUN 15.3 12/02/2016 1219   CREATININE 0.82 10/20/2022 0835   CREATININE 0.71 05/23/2021 0758   CREATININE 0.7 12/02/2016 1219   CALCIUM 9.9 10/20/2022 0835   CALCIUM 10.1 12/02/2016 1219   PROT 6.8 10/20/2022 0835   PROT 7.1 12/02/2016 1219   ALBUMIN 4.3 10/20/2022  0835   ALBUMIN 4.2 12/02/2016 1219   AST 21 10/20/2022 0835   AST 25 05/23/2021 0758   AST 21 12/02/2016 1219   ALT 27 10/20/2022 0835   ALT 26 05/23/2021 0758   ALT 27 12/02/2016 1219   ALKPHOS 94 10/20/2022 0835   ALKPHOS 104 12/02/2016 1219   BILITOT 0.4 10/20/2022 0835   BILITOT 0.6 05/23/2021 0758   BILITOT 0.37 12/02/2016 1219   GFRNONAA >60 10/20/2022 0835   GFRNONAA >60 05/23/2021 0758   GFRAA >60 05/07/2020 0833    No results found for: "SPEP", "UPEP"  Lab Results  Component Value Date   WBC 6.1 10/20/2022   NEUTROABS 3.6 10/20/2022   HGB 13.0 10/20/2022   HCT 39.6 10/20/2022   MCV 92.1 10/20/2022   PLT 142 (L) 10/20/2022      Chemistry      Component Value Date/Time   NA 140 10/20/2022 0835   NA 141 12/02/2016 1219   K 4.2 10/20/2022 0835   K 3.7 12/02/2016 1219   CL 105 10/20/2022 0835   CL 103 01/18/2013 1329   CO2 27 10/20/2022 0835   CO2 27 12/02/2016 1219   BUN 30 (H) 10/20/2022 0835   BUN 15.3 12/02/2016 1219   CREATININE 0.82 10/20/2022 0835   CREATININE 0.71 05/23/2021 0758   CREATININE 0.7 12/02/2016 1219   GLU 236 12/13/2012 1558      Component Value Date/Time   CALCIUM 9.9 10/20/2022 0835   CALCIUM 10.1 12/02/2016 1219   ALKPHOS 94  10/20/2022 0835   ALKPHOS 104 12/02/2016 1219   AST 21 10/20/2022 0835   AST 25 05/23/2021 0758   AST 21 12/02/2016 1219   ALT 27 10/20/2022 0835   ALT 26 05/23/2021 0758   ALT 27 12/02/2016 1219   BILITOT 0.4 10/20/2022 0835   BILITOT 0.6 05/23/2021 0758   BILITOT 0.37 12/02/2016 1219       RADIOGRAPHIC STUDIES: I have personally reviewed the radiological images as listed and agreed with the findings in the report. CT CHEST ABDOMEN PELVIS W CONTRAST  Result Date: 10/15/2022 CLINICAL DATA:  History of ovarian cancer, carcinoma fallopian tube. Monitor. * Tracking Code: BO * EXAM: CT CHEST, ABDOMEN, AND PELVIS WITH CONTRAST TECHNIQUE: Multidetector CT imaging of the chest, abdomen and pelvis was performed following the standard protocol during bolus administration of intravenous contrast. RADIATION DOSE REDUCTION: This exam was performed according to the departmental dose-optimization program which includes automated exposure control, adjustment of the mA and/or kV according to patient size and/or use of iterative reconstruction technique. CONTRAST:  129m OMNIPAQUE IOHEXOL 300 MG/ML  SOLN COMPARISON:  Multiple priors including most recent CT April 18, 2022. FINDINGS: CT CHEST FINDINGS Cardiovascular: Accessed right chest Port-A-Cath with tip at the superior cavoatrial junction. Aortic atherosclerosis. No central pulmonary embolus on this nondedicated study. Normal size heart. No significant pericardial effusion/thickening. Mediastinum/Nodes: Prominent mediastinal lymph nodes are similar dating back to PET-CT June 29, 2019. No pathologically enlarged hilar or axillary lymph nodes. Gas fluid levels in a patulous esophagus. Lungs/Pleura: No suspicious pulmonary nodules or masses. Scarring/atelectasis in the lingula and right middle lobe. No pleural effusion. No pneumothorax. Musculoskeletal: No aggressive lytic or blastic lesion of bone. Multilevel degenerative changes spine with bridging  anterior vertebral osteophytes. CT ABDOMEN PELVIS FINDINGS Hepatobiliary: Stable 15 mm exophytic lesion extending from the medial segment of the left lobe of the liver on image 48/2. No new suspicious hepatic lesion. Gallbladder surgically absent. Similar prominence of the biliary tree favored  reservoir effect post cholecystectomy. Pancreas: No pancreatic ductal dilation or evidence of acute inflammation. Spleen: No splenomegaly or focal splenic lesion. Adrenals/Urinary Tract: Bilateral adrenal glands appear normal. No hydronephrosis. Stable benign 10 mm right interpolar angiomyolipoma which requires no independent imaging follow-up. Urinary bladder is unremarkable for degree of distension. Stomach/Bowel: Radiopaque enteric contrast material traverses the descending colon. Small hiatal hernia. Stomach is minimally distended limiting evaluation. No pathologic dilation of small or large bowel. No evidence of acute bowel inflammation. Vascular/Lymphatic: Aortic atherosclerosis. Smooth IVC contours. No pathologically enlarged abdominal or pelvic lymph nodes. Reproductive: Status post hysterectomy. No enhancing soft tissue nodularity along the vaginal cuff. No suspicious adnexal masses. Other: No significant abdominopelvic free fluid. No discrete peritoneal or omental nodularity. Musculoskeletal: No aggressive lytic or blastic lesion of bone. Multilevel degenerative changes spine IMPRESSION: 1. Stable examination without evidence of new or progressive disease within the chest, abdomen or pelvis. 2. Small hiatal hernia with gas fluid levels in a patulous esophagus, correlate for gastroesophageal reflux. 3.  Aortic Atherosclerosis (ICD10-I70.0). Electronically Signed   By: Dahlia Bailiff M.D.   On: 10/15/2022 14:29

## 2022-10-20 NOTE — Assessment & Plan Note (Signed)
Her thrombocytopenia is stable Observe only for now 

## 2022-10-20 NOTE — Assessment & Plan Note (Signed)
Her blood pressure is trending down since discontinuation of bevacizumab I recommend the patient to discuss with her primary care doctor to consider medication taper

## 2022-10-27 ENCOUNTER — Ambulatory Visit: Payer: Medicare HMO | Admitting: Neurology

## 2022-10-27 ENCOUNTER — Encounter: Payer: Self-pay | Admitting: Neurology

## 2022-10-27 VITALS — Ht 61.0 in | Wt 192.2 lb

## 2022-10-27 DIAGNOSIS — R530 Neoplastic (malignant) related fatigue: Secondary | ICD-10-CM

## 2022-10-27 DIAGNOSIS — E669 Obesity, unspecified: Secondary | ICD-10-CM | POA: Diagnosis not present

## 2022-10-27 DIAGNOSIS — E119 Type 2 diabetes mellitus without complications: Secondary | ICD-10-CM | POA: Diagnosis not present

## 2022-10-27 DIAGNOSIS — G4733 Obstructive sleep apnea (adult) (pediatric): Secondary | ICD-10-CM | POA: Diagnosis not present

## 2022-10-27 MED ORDER — ESZOPICLONE 3 MG PO TABS: ORAL_TABLET | ORAL | 5 refills | Status: DC

## 2022-10-27 MED ORDER — MODAFINIL 200 MG PO TABS: 200.0000 mg | ORAL_TABLET | Freq: Every morning | ORAL | 5 refills | Status: DC

## 2022-10-27 NOTE — Patient Instructions (Signed)
Fatigue If you have fatigue, you feel tired all the time and have a lack of energy or a lack of motivation. Fatigue may make it difficult to start or complete tasks because of exhaustion. Occasional or mild fatigue is often a normal response to activity or life. However, long-term (chronic) or extreme fatigue may be a symptom of a medical condition such as: Depression. Not having enough red blood cells or hemoglobin in the blood (anemia). A problem with a small gland located in the lower front part of the neck (thyroid disorder). Rheumatologic conditions. These are problems related to the body's defense system (immune system). Infections, especially certain viral infections. Fatigue can also lead to negative health outcomes over time. Follow these instructions at home: Medicines Take over-the-counter and prescription medicines only as told by your health care provider. Take a multivitamin if told by your health care provider. Do not use herbal or dietary supplements unless they are approved by your health care provider. Eating and drinking  Avoid heavy meals in the evening. Eat a well-balanced diet, which includes lean proteins, whole grains, plenty of fruits and vegetables, and low-fat dairy products. Avoid eating or drinking too many products with caffeine in them. Avoid alcohol. Drink enough fluid to keep your urine pale yellow. Activity  Exercise regularly, as told by your health care provider. Use or practice techniques to help you relax, such as yoga, tai chi, meditation, or massage therapy. Lifestyle Change situations that cause you stress. Try to keep your work and personal schedules in balance. Do not use recreational or illegal drugs. General instructions Monitor your fatigue for any changes. Go to bed and get up at the same time every day. Avoid fatigue by pacing yourself during the day and getting enough sleep at night. Maintain a healthy weight. Contact a health care  provider if: Your fatigue does not get better. You have a fever. You suddenly lose or gain weight. You have headaches. You have trouble falling asleep or sleeping through the night. You feel angry, guilty, anxious, or sad. You have swelling in your legs or another part of your body. Get help right away if: You feel confused, feel like you might faint, or faint. Your vision is blurry or you have a severe headache. You have severe pain in your abdomen, your back, or the area between your waist and hips (pelvis). You have chest pain, shortness of breath, or an irregular or fast heartbeat. You are unable to urinate, or you urinate less than normal. You have abnormal bleeding from the rectum, nose, lungs, nipples, or, if you are female, the vagina. You vomit blood. You have thoughts about hurting yourself or others. These symptoms may be an emergency. Get help right away. Call 911. Do not wait to see if the symptoms will go away. Do not drive yourself to the hospital. Get help right away if you feel like you may hurt yourself or others, or have thoughts about taking your own life. Go to your nearest emergency room or: Call 911. Call the National Suicide Prevention Lifeline at 1-800-273-8255 or 988. This is open 24 hours a day. Text the Crisis Text Line at 741741. Summary If you have fatigue, you feel tired all the time and have a lack of energy or a lack of motivation. Fatigue may make it difficult to start or complete tasks because of exhaustion. Long-term (chronic) or extreme fatigue may be a symptom of a medical condition. Exercise regularly, as told by your health care provider.   Change situations that cause you stress. Try to keep your work and personal schedules in balance. This information is not intended to replace advice given to you by your health care provider. Make sure you discuss any questions you have with your health care provider. Document Revised: 05/19/2021 Document  Reviewed: 05/19/2021 Elsevier Patient Education  2023 Elsevier Inc.  

## 2022-10-27 NOTE — Progress Notes (Signed)
Provider:  Larey Seat, MD  Primary Care Physician:  Cari Caraway, MD [place] [address]     Referring Provider: Cari Caraway, [address] [place],  [place] [address]          Chief Complaint according to patient   Patient presents with:     New Patient (Initial Visit)           HISTORY OF PRESENT ILLNESS:  Kristin Webb is a 77 y.o. female patient who is here for revisit 10/27/2022 for  second visit on new CPAP.  Chief concern according to patient :    10-27-2022: unpleasant dry mouth in AM, CPAP machine has a smaller reservoir. Water runs out every night by 3-4 AM and her humidifier setting is middle range at 3. Highly compliant patient - she has  been 97% by days and 93% by time of nightly use with an average of 8 hours 21 minutes.  Her CPAP is set at a pressure of 16 cm water with 3 cm expiratory relief.  Her AHI is 3.9/h there are very few central apneas arising the majority of events are hypopneas and are considered obstructive.  She does have a moderate high air leak at the 95th percentile of 33.5 L a minute.  She does sometimes snore through the machine as indicated by the RERA index of 1.5/h. Cheynes stokes breathing was detected.   Highly compliant patient - she has  been 97% by days and 93% by time of nightly use with an average of 8 hours 21 minutes.  Her CPAP is set at a pressure of 16 cm water with 3 cm expiratory relief.  Her AHI is 3.9/h there are very few central apneas arising the majority of events are hypopneas and are considered obstructive.  She does have a moderate high air leak at the 95th percentile of 33.5 L a minute.  She does sometimes snore through the machine as indicated by the RERA index of 1.5/h. Cheynes stokes breathing was detected. Marland Kitchen    She was cancer free on her last scans, Dr Alvy Bimler is her oncologist. Fatigue remains. She was diagnosed with TYPE 2 DM on metformin.  Diet adjusted.   No burning  mouth concerns, rather dry mouth. No more epistaxis.     04/28/22 ALL:  Kristin Webb returns for follow up for OSA on CPAP, EDS and insomnia. She reports doing well on her new CPAP machine. HST was done on 03-03-2022 and confirmed need for ongoing PAP therapy.  She is using it every night. She denies concerns with machine or supplies.    She continues eszopiclone 3mg  QHS. She also takes melatonin 10mg  daily. Belsomra did not help at all. Ambien caused sleep walking. Trazodone caused significant side effects. She is taking modafinil 200mg  daily.   Ovarian cancer: She has stopped bevacizumab infusions. Scans have looked good. She plans follow up with oncology for 5 years with regular surveillance labs.   Last seen 09/24/20. Here for yearly CPAP f/u. Pt reports being up a couple times at night. Taking 2 benadryl at night w her Johnnye Sima, helps reduce it down to 1x.     HPI: I have the pleasure of meeting today again with Kristin Webb meanwhile 77 year old Caucasian widowed female patient whom I have followed for obstructive sleep apnea on CPAP and through stage IV cancer diagnosis with relapse.   She states that she is extremely fatigued which is  more likely attributed to her neoplasm.  Sleepiness and the true sense of the word is not her problem or not as much.  She had some limitations in her physical exercise capabilities.  She is highly compliant with CPAP uses the machine over the last 30 days is 100% with an average time of 8 hours and 34 minutes, overall not but she estimates her sleep time to be less than 6 hours usually.  Her sleep is broken up and she has over-the-counter Benadryl taken to improvement.  Benadryl however will leave her with a dry mouth dry eyes and more fatigued so we have to find an alternative.  Common setting right now is CPAP at 16 cmH2O with an AHI of 2.0 and her 95th percentile air leak is 22.6 L a minute which is still fine as she has no central apneas emerging.  What I need to look  for is how the old her machine currently is 77 years old and will be replaced after a new baseline sleep test she owns a travel machine , too. Nuvigil works for fatigue in her - I will refill. No headaches not jittery.          Kristin Webb is a 77 y.o. female patient with a history of OSA on CPAP -and persistent fatigue, is seen here in a revisit for CPAP follow up/ compliance visit.  She is a Glass blower/designer, has a large social circle. She is living alone with her rescue dog, and is optimistic and happy.  Rv 05-23-2019-I have the pleasure of looking at the data download for [name] encompassing 30 days before 21 May 2019.  She has been 100% compliant with an average usage time of 8 hours 90 minutes, minimum pressure is 5 maximum pressure is applied is 15 cmH2O with 1 cm expiratory pressure relief.  Residual AHI is 1.4 which is an excellent resolution she has minimal air leaks her 95th percentile pressure is 14.9cm  this may be related to some weight gain. She has used melatonin, benadryl.        Kristin Webb is seen here on 09 May 2018, she has been a highly compliant CPAP user, her downloaded data however have to be combined from 2 different machines.  For general use at home she has an air sense 10 AutoSet machine and then she has a travel CPAP as well.  The average user time on days used is 8 hours and 59 minutes, she has used CPAP in one form or another for 97% of the recorded time, she is using AutoSet 5-15 cmH2O was 1 cm EPR level the 95th percentile pressure required is 14.5 which straddles the upper limit.  Residual AHI is 0.8 which is excellent and there are no major air leaks.  She has gained weight since I saw her last and her BMI rose by over 4 points this may explain why she needs a higher setting at this time.  Her Epworth Sleepiness Scale was endorsed at 13 out of 24 possible points and her fatigue severity at 44 out of 63 possible points, the geriatric  depression scale was endorsed at 1 out of 15 points.           Review of Systems: Out of a complete 14 system review, the patient complains of only the following symptoms, and all other reviewed systems are negative.:  Fatigue, sleepiness , snoring, fragmented sleep, Insomnia,   Had nasal irritation with nasal pillows and changed to  FFM, has had epistaxis and blisters in the nostrils.   How likely are you to doze in the following situations: 0 = not likely, 1 = slight chance, 2 = moderate chance, 3 = high chance   Sitting and Reading? Watching Television? Sitting inactive in a public place (theater or meeting)? As a passenger in a car for an hour without a break? Lying down in the afternoon when circumstances permit? Sitting and talking to someone? Sitting quietly after lunch without alcohol? In a car, while stopped for a few minutes in traffic?   Total = 12/ 24 points   FSS endorsed at 51/ 63 points.   GDS : 3/ 61   Lost son in 5/ 2022 .   Social History   Socioeconomic History   Marital status: Widowed    Spouse name: [name]   Number of children: 2   Years of education: Master's   Highest education level: Not on file  Occupational History   Not on file  Tobacco Use   Smoking status: Never   Smokeless tobacco: Never  Vaping Use   Vaping Use: Never used  Substance and Sexual Activity   Alcohol use: No   Drug use: No   Sexual activity: Not on file  Other Topics Concern   Not on file  Social History Narrative   Patient is married [name]). Husband passed away in November 25, 2014   Patient has 2 children by birth and 13 children all together.   Patient has a Oceanographer.   Patient is right-handed.   Patient drinks very little caffeine.         Social Determinants of Health   Financial Resource Strain: Not on file  Food Insecurity: Not on file  Transportation Needs: Not on file  Physical Activity: Not on file  Stress: Not on file  Social Connections: Not on file     Family History  Problem Relation Age of Onset   Lung cancer Mother 97   High blood pressure Mother    High Cholesterol Mother    Lung cancer Father 45       2 ppd smoker   Parkinson's disease Father    Kidney disease Other     Past Medical History:  Diagnosis Date   Burning mouth syndrome    Chronic fatigue    Constipation    Diabetes ([facility]) 05/09/2018   Diarrhea    in the past after gallbladder removal   Fibromyalgia    GERD (gastroesophageal reflux disease)    History of kidney stones 07/2015   Hyperlipidemia    Hypertension    borderline not on meds    Insomnia secondary to depression with anxiety    Lumbar disc disease    Ovarian cancer ([facility]) 2014/2020   met nodule in abd   Pelvic mass in female    Pneumonia    hx of pneumonia as an infant   PONV (postoperative nausea and vomiting)    pain from gas hernia November 25, 2015   Shortness of breath    with exertion    Sleep apnea    uses CPAP   Yeast infection     Past Surgical History:  Procedure Laterality Date   ABDOMINAL HYSTERECTOMY     early 22s   APPENDECTOMY     WITH DEBULKING/BSO   CHOLECYSTECTOMY     early 5s   CYSTOSCOPY W/ URETERAL STENT PLACEMENT Left 07/09/2015   DUE TO NEPHROLITHIASIS Procedure: CYSTOSCOPY WITH LEFT RETROGRADE PYELOGRAM/ LEFT URETERAL STENT PLACEMENT;  Surgeon: Ardis Hughs, MD;  Location: [facility];  Service: Urology;  Laterality: Left;   HERNIA REPAIR     INCISIONAL HERNIA REPAIR N/A 12/25/2015   WITH MESH Procedure:  INCISIONAL HERNIA REPAIR;  Surgeon: Ralene Ok, MD;  Location: [facility];  Service: General;  Laterality: N/A;   [place] N/A 10/21/2018   Procedure: LAPAROSCOPIC INCISIONAL HERNIA REPAIR WITH MESH;  Surgeon: Ralene Ok, MD;  Location: [facility];  Service: General;  Laterality: N/A;   INSERTION OF MESH N/A 12/25/2015   Procedure: INSERTION OF MESH;  Surgeon: Ralene Ok, MD;  Location: [facility];  Service: General;  Laterality: N/A;    LAPAROTOMY Bilateral 11/08/2012   Procedure: EXPLORATORY LAPAROTOMY BILATERAL SALPINGO OOPHORECTOMY TUMOR DEBULKING ;  Surgeon: Imagene Gurney A. Alycia Rossetti, MD;  Location: [facility];  Service: Gynecology;  Laterality: Bilateral;  APPENDECTOMY / OMENTECTOMY   LAPAROTOMY N/A 12/25/2015   Procedure: EXPLORATORY LAPAROTOMY;  Surgeon: Ralene Ok, MD;  Location: [facility];  Service: General;  Laterality: N/A;   LYSIS OF ADHESION N/A 12/25/2015   Procedure: LYSIS OF ADHESION;  Surgeon: Ralene Ok, MD;  Location: [facility];  Service: General;  Laterality: N/A;   PARTIAL HYSTERECTOMY  2006   TRIGGER FINGER RELEASE       Current Outpatient Medications on File Prior to Visit  Medication Sig Dispense Refill   acetaminophen (TYLENOL) 500 MG tablet Take 500 mg by mouth every 6 (six) hours as needed for mild pain.     amLODipine (NORVASC) 10 MG tablet Take 1 tablet (10 mg total) by mouth daily. 30 tablet 11   B Complex-C (B-COMPLEX WITH VITAMIN C) tablet Take 1 tablet by mouth daily.     busPIRone (BUSPAR) 15 MG tablet Take 15 mg by mouth 2 (two) times daily.     calcium carbonate (OSCAL) 1500 (600 Ca) MG TABS tablet Take by mouth daily with breakfast.     chlorthalidone (HYGROTON) 25 MG tablet Take 25 mg by mouth daily.     cholecalciferol (VITAMIN D3) 25 MCG (1000 UT) tablet Take 1,000 Units by mouth daily.     colestipol (COLESTID) 1 g tablet Take 2 g by mouth at bedtime. 1 tablet in the evening, 1/2 tablet in the morning     Eszopiclone 3 MG TABS TAKE ONE TABLET BY MOUTH EVERYDAY AT BEDTIME 30 tablet 2   Evolocumab (REPATHA SURECLICK) [id number] MG/ML SOAJ Inject 1 mL into the skin every 14 (fourteen) days. 2 mL 11   FIBER PO Take by mouth.     FLUoxetine (PROZAC) 20 MG capsule Take 40 mg by mouth every morning.     loperamide (IMODIUM) 2 MG capsule Take by mouth as needed for diarrhea or loose stools.     Melatonin 10 MG TABS Take by mouth.     metFORMIN (GLUCOPHAGE-XR) 500 MG 24 hr tablet Take 500 mg by mouth daily with  breakfast.   3   metoprolol tartrate (LOPRESSOR) 25 MG tablet Take 1 tablet (25 mg total) by mouth 2 (two) times daily. 60 tablet 3   modafinil (PROVIGIL) 200 MG tablet TAKE ONE TABLET BY MOUTH EVERY MORNING 30 tablet 5   Multiple Vitamin (MULTIVITAMIN WITH MINERALS) TABS Take 1 tablet by mouth every morning. Womens 50+     rosuvastatin (CRESTOR) 40 MG tablet      telmisartan (MICARDIS) 40 MG tablet Take 40 mg by mouth daily.     No current facility-administered medications on file prior to visit.    Allergies  Allergen Reactions   Ambien [Zolpidem Tartrate] Other (See Comments)    Sleep walking   Bee Venom Anaphylaxis and Swelling    Difficulty breathing, carries an EPI-pen   Cortisone Nausea And Vomiting, Swelling and Other (See Comments)    INJECTED CORTISONE ONLY, "sick to my stomach, throwing up, hand swelling, and pain."     DIAGNOSTIC DATA (LABS, IMAGING, TESTING) - I reviewed patient records, labs, notes, testing and imaging myself where available.  Lab Results  Component Value Date   WBC 6.1 10/20/2022   HGB 13.0 10/20/2022   HCT 39.6 10/20/2022   MCV 92.1 10/20/2022   PLT 142 (L) 10/20/2022      Component Value Date/Time   NA 140 10/20/2022 0835   NA 141 12/02/2016 1219   K 4.2 10/20/2022 0835   K 3.7 12/02/2016 1219   CL 105 10/20/2022 0835   CL 103 01/18/2013 1329   CO2 27 10/20/2022 0835   CO2 27 12/02/2016 1219   GLUCOSE 111 (H) 10/20/2022 0835   GLUCOSE 98 12/02/2016 1219   GLUCOSE 110 (H) 01/18/2013 1329   BUN 30 (H) 10/20/2022 0835   BUN 15.3 12/02/2016 1219   CREATININE 0.82 10/20/2022 0835   CREATININE 0.71 05/23/2021 0758   CREATININE 0.7 12/02/2016 1219   CALCIUM 9.9 10/20/2022 0835   CALCIUM 10.1 12/02/2016 1219   PROT 6.8 10/20/2022 0835   PROT 7.1 12/02/2016 1219   ALBUMIN 4.3 10/20/2022 0835   ALBUMIN 4.2 12/02/2016 1219   AST 21 10/20/2022 0835   AST 25 05/23/2021 0758   AST 21 12/02/2016 1219   ALT 27 10/20/2022 0835   ALT 26  05/23/2021 0758   ALT 27 12/02/2016 1219   ALKPHOS 94 10/20/2022 0835   ALKPHOS 104 12/02/2016 1219   BILITOT 0.4 10/20/2022 0835   BILITOT 0.6 05/23/2021 0758   BILITOT 0.37 12/02/2016 1219   GFRNONAA >60 10/20/2022 0835   GFRNONAA >60 05/23/2021 0758   GFRAA >60 05/07/2020 0833   No results found for: "CHOL", "HDL", "LDLCALC", "LDLDIRECT", "TRIG", "CHOLHDL" Lab Results  Component Value Date   HGBA1C 6.0 (H) 10/17/2018   No results found for: "VITAMINB12" No results found for: "TSH"  PHYSICAL EXAM:  Today's Vitals   10/27/22 0813  Weight: 192 lb 3.2 oz (87.2 kg)  Height: 5\' 1"  (1.549 m)   Body mass index is 36.32 kg/m.   Wt Readings from Last 3 Encounters:  10/27/22 192 lb 3.2 oz (87.2 kg)  10/20/22 191 lb (86.6 kg)  07/27/22 188 lb 6.4 oz (85.5 kg)     Ht Readings from Last 3 Encounters:  10/27/22 5\' 1"  (1.549 m)  10/20/22 4\' 11"  (1.499 m)  07/27/22 4' 11.45" (1.51 m)      General: The patient is awake, alert and appears not in acute distress.  She reports fatigue, is well groomed. She has a new abdominal hernia.   Ankle edema.  Head: Normocephalic,  Neck is supple. Mallampati 3 , neck circumference: 15. Retrognathia. Dry mouth .  Dental  Decay  Cardiovascular: Regular rate and rhythm without  murmurs or carotid bruit, and without distended neck veins. Respiratory: Lungs are clear to auscultation.Skin:  Without evidence of edema, or rash,. Dry mouth, dry eyes, leg cramping. Trunk: BMI further elevated from 35 up to 41 with signifcant abdominal obesity.  Neurologic exam : The patient is awake and alert, oriented to place and time.  Memory subjective  described as intact. There is a normal attention span & concentration  ability.  Speech is fluent without  dysarthria, dysphonia or aphasia. Mood and affect are appropriate.   Cranial nerves: Pupils are equal and briskly reactive to light.Visual fields by finger perimetry are intact. Hearing to finger rub intact.   Facial sensation intact to fine touch. Facial motor strength is symmetric, her tongue and uvula move in  midline. Motor exam:  Normal tone ,muscle bulk and symmetric strength in all extremities. Grip strength is improved since 2016.  Gait and station: Patient walks with her feet wide apart, increasing her base- she uses a cane  outside the home- here today without assistive device , slightly ataxic- drifting to the sides- Strength within normal limits.  Steps are unfragmented.  Deep tendon reflexes: in the upper and lower extremities are symmetric and intact.    Sensory:  Fine touch  and vibration were normal.  Proprioception tested in the upper extremities was normal.   Coordination: Rapid alternating movements in the fingers/hands were of reduced speed.  The Finger-to-nose maneuver was intact without evidence of ataxia, dysmetria or tremor.      ASSESSMENT AND PLAN 77 y.o. year old female  here with:    1) the patient's obstructive sleep apnea on CPAP is well treated and controlled, she does have some significant air leakage through the fullface mask.  I believe that the erroneous count of apneas hypopneas due to the poor air seal is contributing to a slightly higher AHI as registered by the machine.  She is running out of water between 3 and 4 AM each morning and has to refill the reservoir.  This is unfortunately a rather frequent compliant with the current newest model of ResMed auto titration CPAP. A reduction in humidifier settings has not given her a satisfying result.  I wonder if a pulm rhythm around the fullface mask would give her a better air seal than a cloth cover.  2) longstanding high degree of fatigue is also related to history of neoplasm, chemotherapy, and not so much of depression or prolonged grief.  3) there are some sensory impairments noted manifesting as slight gait ataxia a Boscia and also is slowing and following motor commands such as the finger-nose maneuver. I  don't see much benefit in gait therapy at this time.   4) she has had diplopia, wears bifocals, and she feels her eye sight contributes to her balance issues.     I plan to follow up either personally or through our NP within 6 months.   I would like to thank Cari Caraway, MD and Cari Caraway, [place],  [place] [address] for allowing me to meet with and to take care of this pleasant patient.   CC: I will share my notes with Standley Dakins, MD .  After spending a total time of  29  minutes face to face and additional time for physical and neurologic examination, review of laboratory studies,  personal review of imaging studies, reports and results of other testing and review of referral information / records as far as provided in visit,   Electronically signed by: Larey Seat, MD 10/27/2022 8:42 AM  [facility] Board certified by The [employer] of Sleep Medicine and Diplomate of the [employer] of Sleep Medicine. Board certified In Neurology through the [employer], Fellow of the [employer] of Neurology. Medical Director of [facility].

## 2022-11-09 ENCOUNTER — Other Ambulatory Visit: Payer: Self-pay | Admitting: Radiology

## 2022-11-09 ENCOUNTER — Other Ambulatory Visit: Payer: Medicare HMO

## 2022-11-09 ENCOUNTER — Ambulatory Visit: Payer: Medicare HMO

## 2022-11-10 ENCOUNTER — Ambulatory Visit (HOSPITAL_COMMUNITY)
Admission: RE | Admit: 2022-11-10 | Discharge: 2022-11-10 | Disposition: A | Discharge status: Home / Self Care | Payer: Medicare HMO | Source: Ambulatory Visit | Attending: Hematology and Oncology | Admitting: Hematology and Oncology

## 2022-11-10 ENCOUNTER — Encounter (HOSPITAL_COMMUNITY): Payer: Self-pay

## 2022-11-10 DIAGNOSIS — G4730 Sleep apnea, unspecified: Secondary | ICD-10-CM | POA: Diagnosis not present

## 2022-11-10 DIAGNOSIS — C5701 Malignant neoplasm of right fallopian tube: Secondary | ICD-10-CM | POA: Insufficient documentation

## 2022-11-10 DIAGNOSIS — E785 Hyperlipidemia, unspecified: Secondary | ICD-10-CM | POA: Insufficient documentation

## 2022-11-10 DIAGNOSIS — E119 Type 2 diabetes mellitus without complications: Secondary | ICD-10-CM | POA: Diagnosis not present

## 2022-11-10 DIAGNOSIS — I10 Essential (primary) hypertension: Secondary | ICD-10-CM | POA: Diagnosis not present

## 2022-11-10 DIAGNOSIS — K219 Gastro-esophageal reflux disease without esophagitis: Secondary | ICD-10-CM | POA: Insufficient documentation

## 2022-11-10 DIAGNOSIS — Z7984 Long term (current) use of oral hypoglycemic drugs: Secondary | ICD-10-CM | POA: Insufficient documentation

## 2022-11-10 DIAGNOSIS — M797 Fibromyalgia: Secondary | ICD-10-CM | POA: Insufficient documentation

## 2022-11-10 DIAGNOSIS — Z452 Encounter for adjustment and management of vascular access device: Secondary | ICD-10-CM | POA: Insufficient documentation

## 2022-11-10 HISTORY — PX: IR REMOVAL TUN ACCESS W/ PORT W/O FL MOD SED: IMG2290

## 2022-11-10 LAB — GLUCOSE, CAPILLARY: Glucose-Capillary: 102 mg/dL — ABNORMAL HIGH (ref 70–99)

## 2022-11-10 MED ORDER — SODIUM CHLORIDE 0.9 % IV SOLN: INTRAVENOUS | Status: DC

## 2022-11-10 MED ORDER — MIDAZOLAM HCL 2 MG/2ML IJ SOLN
INTRAMUSCULAR | Status: AC | PRN
  Administered 2022-11-10: 1.5 mg via INTRAVENOUS
  Administered 2022-11-10: .5 mg via INTRAVENOUS
  Administered 2022-11-10: 1 mg via INTRAVENOUS

## 2022-11-10 MED ORDER — MIDAZOLAM HCL 2 MG/2ML IJ SOLN
INTRAMUSCULAR | Status: AC
  Filled 2022-11-10: qty 2

## 2022-11-10 MED ORDER — FENTANYL CITRATE PF 50 MCG/ML IJ SOSY
PREFILLED_SYRINGE | INTRAMUSCULAR | Status: AC
  Filled 2022-11-10: qty 1

## 2022-11-10 MED ORDER — LIDOCAINE-EPINEPHRINE 1 %-1:100000 IJ SOLN
INTRAMUSCULAR | Status: AC
  Administered 2022-11-10: 20 mL
  Filled 2022-11-10: qty 1

## 2022-11-10 MED ORDER — FENTANYL CITRATE (PF) 100 MCG/2ML IJ SOLN
INTRAMUSCULAR | Status: AC | PRN
  Administered 2022-11-10 (×2): 50 ug via INTRAVENOUS

## 2022-11-10 NOTE — H&P (Signed)
Referring Physician(s): [place]  Supervising Physician: Mugweru,J  Patient Status:  [facility]  Chief Complaint:  "I'm getting my port out"  Subjective: Pt known to IR team from port a cath placement in 2014 and rectus abdominus muscle mass bx in 2019. 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 She is no longer using her port a cath and presents today for port removal. She denies fever, HA, CP, abd/back pain,vomiting or bleeding. She does have some chronic dyspnea/cough, occ nausea.   Past Medical History:  Diagnosis Date   Burning mouth syndrome    Chronic fatigue    Constipation    Diabetes 05/09/2018   Diarrhea    in the past after gallbladder removal   Fibromyalgia    GERD (gastroesophageal reflux disease)    History of kidney stones 07/2015   Hyperlipidemia    Hypertension    borderline not on meds    Insomnia secondary to depression with anxiety    Lumbar disc disease    Ovarian cancer 2014/2020   met nodule in abd   Pelvic mass in female    Pneumonia    hx of pneumonia as an infant   PONV (postoperative nausea and vomiting)    pain from gas hernia 2017   Shortness of breath    with exertion    Sleep apnea    uses CPAP   Yeast infection    Past Surgical History:  Procedure Laterality Date   ABDOMINAL HYSTERECTOMY     early 59s   APPENDECTOMY     WITH DEBULKING/BSO   CHOLECYSTECTOMY     early 56s   CYSTOSCOPY W/ URETERAL STENT PLACEMENT Left 07/09/2015   DUE TO NEPHROLITHIASIS Procedure: CYSTOSCOPY WITH LEFT RETROGRADE PYELOGRAM/ LEFT URETERAL STENT PLACEMENT;  Surgeon: Ardis Hughs, MD;  Location: [facility];  Service: Urology;  Laterality: Left;   HERNIA REPAIR     INCISIONAL HERNIA REPAIR N/A 12/25/2015   WITH MESH Procedure:  INCISIONAL HERNIA REPAIR;  Surgeon: Ralene Ok, MD;  Location: [facility];  Service: General;  Laterality: N/A;   [place] N/A 10/21/2018   Procedure: LAPAROSCOPIC INCISIONAL  HERNIA REPAIR WITH MESH;  Surgeon: Ralene Ok, MD;  Location: [facility];  Service: General;  Laterality: N/A;   INSERTION OF MESH N/A 12/25/2015   Procedure: INSERTION OF MESH;  Surgeon: Ralene Ok, MD;  Location: [facility];  Service: General;  Laterality: N/A;   LAPAROTOMY Bilateral 11/08/2012   Procedure: EXPLORATORY LAPAROTOMY BILATERAL SALPINGO OOPHORECTOMY TUMOR DEBULKING ;  Surgeon: Imagene Gurney A. Alycia Rossetti, MD;  Location: [facility];  Service: Gynecology;  Laterality: Bilateral;  APPENDECTOMY / OMENTECTOMY   LAPAROTOMY N/A 12/25/2015   Procedure: EXPLORATORY LAPAROTOMY;  Surgeon: Ralene Ok, MD;  Location: [facility];  Service: General;  Laterality: N/A;   LYSIS OF ADHESION N/A 12/25/2015   Procedure: LYSIS OF ADHESION;  Surgeon: Ralene Ok, MD;  Location: [facility];  Service: General;  Laterality: N/A;   PARTIAL HYSTERECTOMY  2006   TRIGGER FINGER RELEASE        Allergies: Ambien [zolpidem tartrate], Bee venom, and Cortisone  Medications: Prior to Admission medications   Medication Sig Start Date End Date Taking? Authorizing Provider  acetaminophen (TYLENOL) 500 MG tablet Take 500 mg by mouth every 6 (six) hours as needed for mild pain.    [provider]  amLODipine (NORVASC) 10 MG tablet Take 1 tablet (10 mg total) by mouth daily. 02/13/20   [name], MD  B Complex-C (B-COMPLEX WITH VITAMIN C) tablet Take 1 tablet by mouth daily.    [provider]  busPIRone (BUSPAR) 15 MG tablet Take 15 mg by mouth 2 (two) times daily. 02/10/22   [provider]  calcium carbonate (OSCAL) 1500 (600 Ca) MG TABS tablet Take by mouth daily with breakfast.    [provider]  chlorthalidone (HYGROTON) 25 MG tablet Take 25 mg by mouth daily.    [provider]  cholecalciferol (VITAMIN D3) 25 MCG (1000 UT) tablet Take 1,000 Units by mouth daily.    [provider]  colestipol (COLESTID) 1 g tablet Take 2 g by mouth at bedtime. 1 tablet in the evening, 1/2  tablet in the morning 07/24/15   [provider]  Eszopiclone 3 MG TABS TAKE ONE TABLET BY MOUTH EVERYDAY AT BEDTIME 10/27/22   Dohmeier, Asencion Partridge, MD  Evolocumab (REPATHA SURECLICK) [id number] MG/ML SOAJ Inject 1 mL into the skin every 14 (fourteen) days. 03/03/22   Janina Mayo, MD  FIBER PO Take by mouth.    [provider]  FLUoxetine (PROZAC) 20 MG capsule Take 40 mg by mouth every morning. 07/12/14   [provider]  loperamide (IMODIUM) 2 MG capsule Take by mouth as needed for diarrhea or loose stools.    [provider]  Melatonin 10 MG TABS Take by mouth.    [provider]  metFORMIN (GLUCOPHAGE-XR) 500 MG 24 hr tablet Take 500 mg by mouth daily with breakfast.  04/06/18   [provider]  metoprolol tartrate (LOPRESSOR) 25 MG tablet Take 1 tablet (25 mg total) by mouth 2 (two) times daily. 02/26/20   [name], MD  modafinil (PROVIGIL) 200 MG tablet Take 1 tablet (200 mg total) by mouth every morning. 10/27/22   Dohmeier, Asencion Partridge, MD  Multiple Vitamin (MULTIVITAMIN WITH MINERALS) TABS Take 1 tablet by mouth every morning. Womens 50+    [provider]  rosuvastatin (CRESTOR) 40 MG tablet  04/16/21   [provider]  telmisartan (MICARDIS) 40 MG tablet Take 40 mg by mouth daily.    [provider]     Vital Signs:pending  Code Status: FULL CODE    Physical Exam; awake/alert; chest- CTA bilat; clean intact rt chest wall port a cath; heart- RRR; abd- soft,+BS,NT; no LE edema  Imaging: No results found.  Labs:  CBC: Recent Labs    02/24/22 0747 03/18/22 0748 04/07/22 0803 10/20/22 0835  WBC 6.0 4.7 5.2 6.1  HGB 14.1 13.6 14.0 13.0  HCT 42.4 40.0 41.6 39.6  PLT 145* 134* 131* 142*    COAGS: No results for input(s): "INR", "APTT" in the last 8760 hours.  BMP: Recent Labs    02/24/22 0747 03/18/22 0748 04/07/22 0803 10/20/22 0835  NA 139 139 139 140  K 3.9 4.1 3.8 4.2  CL 103 104 104 105  CO2  29 29 30 27   GLUCOSE 103* 106* 126* 111*  BUN 26* 21 17 30*  CALCIUM 10.3 9.5 10.1 9.9  CREATININE 0.71 0.82 0.65 0.82  GFRNONAA >60 >60 >60 >60    LIVER FUNCTION TESTS: Recent Labs    02/24/22 0747 03/18/22 0748 04/07/22 0803 10/20/22 0835  BILITOT 0.5 0.5 0.6 0.4  AST 22 23 33 21  ALT 24 21 34 27  ALKPHOS 75 62 70 94  PROT 6.9 6.9 6.8 6.8  ALBUMIN 4.3 4.3 4.4 4.3    Assessment and Plan: 77 yo female with past medical history significant for  diabetes, fibromyalgia, GERD, nephrolithiasis, hypertension, hyperlipidemia, lumbar disc disease, sleep apnea and fallopian tube carcinoma with prior Port-A-Cath placement in 2014.  She has completed treatment and is no longer using her Port-A-Cath.  She presents today for Port-A-Cath removal.  Details/risks of procedure, including but not limited to, internal bleeding, infection, injury to adjacent structures discussed with patient with her understanding and consent.   Electronically Signed: D. Rowe Robert, PA-C 11/10/2022, 12:49 PM   I spent a total of 20 minutes at the the patient's bedside AND on the patient's hospital floor or unit, greater than 50% of which was counseling/coordinating care for port a cath removal

## 2022-11-10 NOTE — Procedures (Signed)
Vascular and Interventional Radiology Procedure Note  Patient: Kristin Webb DOB: [birth year] Medical Record Number: [id number] Note Date/Time: 11/10/22 2:17 PM   Performing Physician: Michaelle Birks, MD Assistant(s): None  Diagnosis:  Hx of fallopian CA  Procedure: PORT REMOVAL  Anesthesia: Conscious Sedation Complications: None Estimated Blood Loss: Minimal Specimens:  None  Findings:  Successful removal of a right-sided venous port. Primary incision closure. Dermabond at skin.  See detailed procedure note with images in PACS. The patient tolerated the procedure well without incident or complication and was returned to [facility] in stable condition.    Michaelle Birks, MD [facility]   Pager. [place]

## 2022-11-10 NOTE — Discharge Instructions (Signed)
Implanted Port Removal, Care After The following information offers guidance on how to care for yourself after your procedure. Your health care provider may also give you more specific instructions. If you have problems or questions, contact your health care provider.  Urgent needs - Interventional Radiology on call MD [phone]  Wound - May remove dressing and shower in 24 to 48 hours.  Keep site clean and dry.  Replace with bandaid as needed.  Do not submerge in tub or water until site healing well. If closed with glue, glue will flake off on its own. What can I expect after the procedure? After the procedure, it is common to have: Soreness or pain near your incision. Some swelling or bruising near your incision. Follow these instructions at home: Medicines Take over-the-counter and prescription medicines only as told by your health care provider. If you were prescribed an antibiotic medicine, take it as told by your health care provider. Do not stop taking the antibiotic even if you start to feel better. Bathing Do not take baths, swim, or use a hot tub until your health care provider approves. Ask your health care provider if you can take showers. You may only be allowed to take sponge baths. Incision care Follow instructions from your health care provider about how to take care of your incision. Make sure you: Wash your hands with soap and water for at least 20 seconds before and after you change your bandage (dressing). If soap and water are not available, use hand sanitizer. Change your dressing as told by your health care provider. Keep your dressing dry. Leave stitches (sutures), skin glue, or adhesive strips in place. These skin closures may need to stay in place for 2 weeks or longer. If adhesive strip edges start to loosen and curl up, you may trim the loose edges. Do not remove adhesive strips completely unless your health care provider tells you to do that. Check your incision  area every day for signs of infection. Check for: More redness, swelling, or pain. More fluid or blood. Warmth. Pus or a bad smell. Activity Return to your normal activities as told by your health care provider. Ask your health care provider what activities are safe for you. You may have to avoid lifting. Ask your health care provider how much you can safely lift. Do not do activities that involve lifting your arms over your head. Driving If you were given a sedative during the procedure, it can affect you for several hours. Do not drive or operate machinery until your health care provider says that it is safe. If you did not receive a sedative, ask your health care provider when it is safe to drive. General instructions Do not use any products that contain nicotine or tobacco. These products include cigarettes, chewing tobacco, and vaping devices, such as e-cigarettes. These can delay healing after surgery. If you need help quitting, ask your health care provider. Keep all follow-up visits. This is important. Contact a health care provider if: You have a fever or chills. You have more redness, swelling, or pain around your incision. You have more fluid or blood coming from your incision. Your incision feels warm to the touch. You have pus or a bad smell coming from your incision. You have pain that is not relieved by your pain medicine. Get help right away if: You have chest pain. You have difficulty breathing. These symptoms may be an emergency. Get help right away. Call 911. Do not wait to   see if the symptoms will go away. Do not drive yourself to the hospital. Summary After the procedure, it is common to have pain, soreness, swelling, or bruising near your incision. If you were prescribed an antibiotic medicine, take it as told by your health care provider. Do not stop taking the antibiotic even if you start to feel better. If you were given a sedative during the procedure, it can  affect you for several hours. Do not drive or operate machinery until your health care provider says that it is safe. Return to your normal activities as told by your health care provider. Ask your health care provider what activities are safe for you. This information is not intended to replace advice given to you by your health care provider. Make sure you discuss any questions you have with your health care provider. Document Revised: 01/28/2021 Document Reviewed: 01/28/2021 Elsevier Patient Education  2023 [employer]   Moderate Conscious Sedation, Adult, Care After This sheet gives you information about how to care for yourself after your procedure. Your health care provider may also give you more specific instructions. If you have problems or questions, contact your health care provider. What can I expect after the procedure? After the procedure, it is common to have: Sleepiness for several hours. Impaired judgment for several hours. Difficulty with balance. Vomiting if you eat too soon. Follow these instructions at home: For the time period you were told by your health care provider:   Rest. Do not participate in activities where you could fall or become injured. Do not drive or use machinery. Do not drink alcohol. Do not take sleeping pills or medicines that cause drowsiness. Do not make important decisions or sign legal documents. Do not take care of children on your own. Eating and drinking Follow the diet recommended by your health care provider. Drink enough fluid to keep your urine pale yellow. If you vomit: Drink water, juice, or soup when you can drink without vomiting. Make sure you have little or no nausea before eating solid foods. General instructions Take over-the-counter and prescription medicines only as told by your health care provider. Have a responsible adult stay with you for the time you are told. It is important to have someone help care for you until  you are awake and alert. Do not smoke. Keep all follow-up visits as told by your health care provider. This is important. Contact a health care provider if: You are still sleepy or having trouble with balance after 24 hours. You feel light-headed. You keep feeling nauseous or you keep vomiting. You develop a rash. You have a fever. You have redness or swelling around the IV site. Get help right away if: You have trouble breathing. You have new-onset confusion at home. Summary After the procedure, it is common to feel sleepy, have impaired judgment, or feel nauseous if you eat too soon. Rest after you get home. Know the things you should not do after the procedure. Follow the diet recommended by your health care provider and drink enough fluid to keep your urine pale yellow. Get help right away if you have trouble breathing or new-onset confusion at home. This information is not intended to replace advice given to you by your health care provider. Make sure you discuss any questions you have with your health care provider. Document Revised: 11/24/2019 Document Reviewed: 06/22/2019 Elsevier Patient Education  2023 Elsevier Inc.         

## 2022-11-26 DIAGNOSIS — K529 Noninfective gastroenteritis and colitis, unspecified: Secondary | ICD-10-CM | POA: Diagnosis not present

## 2022-11-26 DIAGNOSIS — M85852 Other specified disorders of bone density and structure, left thigh: Secondary | ICD-10-CM | POA: Diagnosis not present

## 2022-11-26 DIAGNOSIS — D696 Thrombocytopenia, unspecified: Secondary | ICD-10-CM | POA: Diagnosis not present

## 2022-11-26 DIAGNOSIS — E1121 Type 2 diabetes mellitus with diabetic nephropathy: Secondary | ICD-10-CM | POA: Diagnosis not present

## 2022-11-26 DIAGNOSIS — E1122 Type 2 diabetes mellitus with diabetic chronic kidney disease: Secondary | ICD-10-CM | POA: Diagnosis not present

## 2022-11-26 DIAGNOSIS — I2510 Atherosclerotic heart disease of native coronary artery without angina pectoris: Secondary | ICD-10-CM | POA: Diagnosis not present

## 2022-11-26 DIAGNOSIS — E782 Mixed hyperlipidemia: Secondary | ICD-10-CM | POA: Diagnosis not present

## 2022-11-26 DIAGNOSIS — R801 Persistent proteinuria, unspecified: Secondary | ICD-10-CM | POA: Diagnosis not present

## 2022-11-26 DIAGNOSIS — Z6837 Body mass index (BMI) 37.0-37.9, adult: Secondary | ICD-10-CM | POA: Diagnosis not present

## 2022-11-26 DIAGNOSIS — I10 Essential (primary) hypertension: Secondary | ICD-10-CM | POA: Diagnosis not present

## 2022-11-30 DIAGNOSIS — E782 Mixed hyperlipidemia: Secondary | ICD-10-CM | POA: Diagnosis not present

## 2022-11-30 DIAGNOSIS — I10 Essential (primary) hypertension: Secondary | ICD-10-CM | POA: Diagnosis not present

## 2022-11-30 DIAGNOSIS — M159 Polyosteoarthritis, unspecified: Secondary | ICD-10-CM | POA: Diagnosis not present

## 2022-11-30 DIAGNOSIS — E1159 Type 2 diabetes mellitus with other circulatory complications: Secondary | ICD-10-CM | POA: Diagnosis not present

## 2022-12-01 ENCOUNTER — Ambulatory Visit
Admission: RE | Admit: 2022-12-01 | Discharge: 2022-12-01 | Disposition: A | Discharge status: Home / Self Care | Payer: Medicare HMO | Source: Ambulatory Visit | Attending: Family Medicine | Admitting: Family Medicine

## 2022-12-01 DIAGNOSIS — Z1231 Encounter for screening mammogram for malignant neoplasm of breast: Secondary | ICD-10-CM

## 2022-12-02 ENCOUNTER — Other Ambulatory Visit: Payer: Self-pay | Admitting: Family Medicine

## 2022-12-02 DIAGNOSIS — E119 Type 2 diabetes mellitus without complications: Secondary | ICD-10-CM | POA: Diagnosis not present

## 2022-12-02 DIAGNOSIS — Z961 Presence of intraocular lens: Secondary | ICD-10-CM | POA: Diagnosis not present

## 2022-12-02 DIAGNOSIS — H43813 Vitreous degeneration, bilateral: Secondary | ICD-10-CM | POA: Diagnosis not present

## 2022-12-02 DIAGNOSIS — R928 Other abnormal and inconclusive findings on diagnostic imaging of breast: Secondary | ICD-10-CM

## 2022-12-02 DIAGNOSIS — H5111 Convergence insufficiency: Secondary | ICD-10-CM | POA: Diagnosis not present

## 2022-12-14 ENCOUNTER — Ambulatory Visit: Payer: Medicare HMO

## 2022-12-14 ENCOUNTER — Ambulatory Visit
Admission: RE | Admit: 2022-12-14 | Discharge: 2022-12-14 | Disposition: A | Discharge status: Home / Self Care | Payer: Medicare HMO | Source: Ambulatory Visit | Attending: Family Medicine | Admitting: Family Medicine

## 2022-12-14 DIAGNOSIS — N6321 Unspecified lump in the left breast, upper outer quadrant: Secondary | ICD-10-CM | POA: Diagnosis not present

## 2022-12-14 DIAGNOSIS — Z1231 Encounter for screening mammogram for malignant neoplasm of breast: Secondary | ICD-10-CM | POA: Diagnosis not present

## 2022-12-14 DIAGNOSIS — R928 Other abnormal and inconclusive findings on diagnostic imaging of breast: Secondary | ICD-10-CM

## 2023-01-11 DIAGNOSIS — M79605 Pain in left leg: Secondary | ICD-10-CM | POA: Diagnosis not present

## 2023-01-11 DIAGNOSIS — Z6838 Body mass index (BMI) 38.0-38.9, adult: Secondary | ICD-10-CM | POA: Diagnosis not present

## 2023-01-11 DIAGNOSIS — M79652 Pain in left thigh: Secondary | ICD-10-CM | POA: Diagnosis not present

## 2023-01-11 DIAGNOSIS — C7951 Secondary malignant neoplasm of bone: Secondary | ICD-10-CM | POA: Diagnosis not present

## 2023-01-11 DIAGNOSIS — M5416 Radiculopathy, lumbar region: Secondary | ICD-10-CM | POA: Diagnosis not present

## 2023-01-15 ENCOUNTER — Other Ambulatory Visit (HOSPITAL_COMMUNITY): Payer: Self-pay | Admitting: Neurosurgery

## 2023-01-15 DIAGNOSIS — C7951 Secondary malignant neoplasm of bone: Secondary | ICD-10-CM

## 2023-01-19 ENCOUNTER — Ambulatory Visit (HOSPITAL_COMMUNITY)
Admission: RE | Admit: 2023-01-19 | Discharge: 2023-01-19 | Disposition: A | Discharge status: Home / Self Care | Payer: Medicare HMO | Source: Ambulatory Visit | Attending: Neurosurgery | Admitting: Neurosurgery

## 2023-01-19 DIAGNOSIS — M47816 Spondylosis without myelopathy or radiculopathy, lumbar region: Secondary | ICD-10-CM | POA: Diagnosis not present

## 2023-01-19 DIAGNOSIS — C7951 Secondary malignant neoplasm of bone: Secondary | ICD-10-CM | POA: Diagnosis not present

## 2023-01-19 DIAGNOSIS — M545 Low back pain, unspecified: Secondary | ICD-10-CM | POA: Diagnosis not present

## 2023-01-19 MED ORDER — GADOBUTROL 1 MMOL/ML IV SOLN
8.0000 mL | Freq: Once | INTRAVENOUS | Status: AC | PRN
  Administered 2023-01-19: 8 mL via INTRAVENOUS

## 2023-01-21 ENCOUNTER — Encounter: Payer: Self-pay | Admitting: Psychiatry

## 2023-01-25 ENCOUNTER — Inpatient Hospital Stay: Payer: Medicare HMO | Attending: Gynecologic Oncology | Admitting: Psychiatry

## 2023-01-25 ENCOUNTER — Other Ambulatory Visit: Payer: Self-pay

## 2023-01-25 VITALS — BP 149/85 | HR 70 | Temp 98.0°F | Resp 16 | Ht 59.06 in | Wt 192.0 lb

## 2023-01-25 DIAGNOSIS — Z08 Encounter for follow-up examination after completed treatment for malignant neoplasm: Secondary | ICD-10-CM | POA: Diagnosis not present

## 2023-01-25 DIAGNOSIS — Z9221 Personal history of antineoplastic chemotherapy: Secondary | ICD-10-CM | POA: Diagnosis not present

## 2023-01-25 DIAGNOSIS — C5700 Malignant neoplasm of unspecified fallopian tube: Secondary | ICD-10-CM

## 2023-01-25 DIAGNOSIS — Z90722 Acquired absence of ovaries, bilateral: Secondary | ICD-10-CM | POA: Insufficient documentation

## 2023-01-25 DIAGNOSIS — Z8544 Personal history of malignant neoplasm of other female genital organs: Secondary | ICD-10-CM | POA: Diagnosis not present

## 2023-01-25 NOTE — Patient Instructions (Signed)
It was a pleasure to see you in clinic today. - Exam is normal - Return visit planned for 6 months (you see Dr. Bertis Ruddy in 3months in September).  Thank you very much for allowing me to provide care for you today.  I appreciate your confidence in choosing our Gynecologic Oncology team at [facility].  If you have any questions about your visit today please call our office or send [place] a MyChart message and we will get back to you as soon as possible.

## 2023-01-25 NOTE — Progress Notes (Signed)
Gynecologic Oncology Return Clinic Visit  Date of Service: 01/25/2023 Referring Provider: Artis Delay, MD  Assessment & Plan: Kristin Webb is a 77 y.o. woman with recurrent platinum sensitive fallopian tube carcinoma, recurrence treated with carboplatin/paclitaxel (completed 12/2019) followed by maintenance bevacizumab through 04/2022. She presents today for surveillance.   Fallopian tube carcinoma: - NED on exam today. - Signs/symptoms of recurrence reviewed. - Last imaging 10/2021 reviewed.  - CA125 does not appear to be a sensitive marker for her. - Reviewed plan for q3 month surveillance visits, alternating between me and Dr. Bertis Ruddy.   RTC 3 months with Dr. Bertis Ruddy, 6 months with Gyn Onc.  Clide Cliff, MD Gynecologic Oncology   Medical Decision Making I personally spent TOTAL 20 minutes face-to-face and non-face-to-face in the care of this patient, which includes all pre, intra, and post visit time on the date of service.  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  Interval development of large mid abdominal mass highly concerning for right ovarian cancer.  There is mild omental nodularity on the left, and peritoneal disease cannot be completely excluded.  There is no ascites or other evidence of metastatic disease. 2.  Mild  associated renal pelvocaliectasis bilaterally without obstruction.  Nonobstructing left renal calculus and a small right renal angiomyolipoma noted incidentally.   11/08/2012 Pathology Results   1. 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 - BENIGN FALLOPIAN TUBE WITH NONSPECIFIC CHRONIC INFLAMMATION. 2. Ovary and fallopian tube, left - BENIGN OVARY; NEGATIVE FOR ATYPIA OR MALIGNANCY. - BENIGN FALLOPIAN TUBE; NEGATIVE FOR ATYPIA OR MALIGNANCY. 3. Omentum, resection for tumor - HIGH GRADE CARCINOMA, SEE COMMENT. 4. Appendix, Other than Incidental - FIBROUS OBLITERATION OF APPENDICEAL TIP. - NEGATIVE FOR MALIGNANCY.   11/08/2012 Surgery   Surgery: Exploratory laparotomy, bilateral salpingo-oophorectomy, appendectomy, infacolic omentectomy, optimal debulking   Surgeons:  Paola A. Duard Brady, MD; Antionette Char, MD    Assistant: Telford Nab  Pathology: Bilateral fallopian tubes and ovaries to pathology. Appendix as well as omentum. 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   Operative findings: 25 cm right adnexal mass with smooth surface. Surgically absent uterus. Atrophic-appearing left ovary. Normal appearing appendix. Within the omentum there were centimeter nodules scattered throughout the omentum. The remainder of the surfaces were benign.   12/08/2012 Procedure   Impression:  Placement of a subcutaneous port device.  The catheter tip is in the lower SVC and ready to be used.     12/13/2012 - 03/28/2013 Chemotherapy   s/p 6 cycles of paclitaxel and carboplatin   12/13/2012 - 03/28/2013 Chemotherapy   The patient had 6 cycles of carboplatin and Taxol   03/24/2013 Imaging   US abdomen   04/24/2013 Imaging   CT abdomen Interval resection of the large right  pelvic and lower  abdominal mass lesion with apparent omentectomy.  No evidence for intraperitoneal free fluid on today's study.  No discernible peritoneal lesions.   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   09/07/2013 Genetic Testing   Patient has genetic testing done for BRCA1/2 panel Results revealed patient has no mutation(s):   07/09/2015 Imaging   CT abdomen 1. 12 mm obstructive calculus at the left ureteropelvic junction with moderate proximal hydronephrosis. 2. 2 small supraumbilical ventral hernias, one containing a short segment of the mid transverse colon and the other containing a short segment of the mid small bowel. There is no associated evidence to suggest bowel incarceration or obstruction at this time. 3. Tiny locule of gas non dependently in the lumen of the urinary bladder. This is presumably iatrogenic related to recent catheterization for urinalysis. Alternatively, this could be seen in the setting of urinary tract infection with gas-forming organisms. Clinical correlation for history of recent catheterization is recommended. 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 5. Status post cholecystectomy. 6. Additional incidental findings, as above.     12/11/2016 Imaging   Ct abdomen 1. No evidence of metastatic ovarian cancer. 2. Recurrent subxiphoid ventral abdominal wall hernia containing transverse colon. No evidence of incarceration or obstruction. 3. Stable incidental findings in the liver and kidneys. No recurrent urinary tract calculus. 4. Progressive lower lumbar spondylosis. 5.  Aortic Atherosclerosis (ICD10-I70.0).     08/30/2017 Imaging   MRI thoracic spine 1. At T5-6 there is a small central disc protrusion contacting the ventral thoracic spinal cord. No central canal or foraminal stenosis. 2. At T9-10 there is a small right paracentral disc protrusion. 3.  No acute osseous injury of the thoracic spine. 4. No aggressive osseous  lesion to suggest metastatic disease.   09/24/2017 Tumor Marker   Patient's tumor was tested for the following markers: CA-125 Results of the tumor marker test revealed 21.7   09/30/2017 Imaging   CT abdomen 1. New small clustered soft tissue nodules in the left lower quadrant in the sigmoid mesentery, largest 1.0 cm, which could represent recurrent peritoneal tumor implants. No ascites.  2. Midline high ventral abdominal wall hernia containing a portion of the transverse colon is mildly increased in size, and without bowel complication at this time. 3. Chronic findings include: Aortic Atherosclerosis (ICD10-I70.0). Diffuse hepatic steatosis. 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   10/11/2017 PET scan   1. Nodules in the sigmoid mesentery are hypermetabolic and highly worrisome for metastatic disease. 2. Attic steatosis.     11/03/2017 Procedure   Successful CT-guided rectus abdominal muscle mass core biopsy. Path: - FOREIGN BODY GIANT CELL REACTION INVOLVING FIBROADIPOSE TISSUE AND SKELETAL MUSCLE. - NO EVIDENCE OF MALIGNANCY.   11/04/2017 Cancer Staging   Staging form: Fallopian Tube, AJCC 7th Edition - Clinical: FIGO Stage IIIC, calculated as Stage IV (rT3, N0, M1) - Signed by Artis Delay, MD on 08/09/2019   02/2018 Imaging   CT: 1. Continued increase in size small peritoneal nodules along the mesenteric border of the proximal sigmoid colon as well as along the serosal surface of the proximal sigmoid colon. 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 2. No evidence of distant disease. 3. Stable large ventral hernia.   05/2018 Imaging   PET: 1. Redemonstration of hypermetabolic nodules within the sigmoid mesocolon. Mild response to therapy relative to CT of 02/24/2018. Mixed response to therapy compared to the most recent PET of 10/11/2017. 2.  Hypermetabolism within the right pelvic rectus  musculature, increased since the prior PET.  Clinical service requested comparison to the 11/24/2017 CT. Index 10 mm nodule within the sigmoid mesocolon was similar to the 02/24/2018 CT, and as described on that exam, increased from 7 mm on 11/24/2017. More inferior nodule within the mesocolon measures 12 mm today on image 156/4 and 8 mm on 11/24/2017.   05/2018 Imaging   CT: IMPRESSION: 1. Since 02/24/2018, decreased size of peritoneal nodules centered in the sigmoid mesocolon. 2. No evidence of new or progressive disease. 3. Hepatic steatosis. 4. Subcentimeter right renal angiomyolipoma, similar. 5.  Aortic Atherosclerosis (ICD10-I70.0). 6. Ventral abdominal wall laxity containing transverse colon, similar.   08/2018 Imaging   CT: IMPRESSION: 1. Nodules within the sigmoid mesocolon have decreased in size compared to prior. No new peritoneal or omental nodularity. 2. No evidence local recurrence the pelvis. 3. Ventral hernia contains a segment of transverse colon. No change from prior.     06/19/2019 Imaging   1. No evidence of metastatic disease in the abdomen pelvis. 2. No peritoneal or omental metastasis identified.  No free fluid. 3. Postcholecystectomy and hysterectomy. Comparison exams are made available. Comparison CT 09/08/2018 and 05/27/2018. PET-CT 05/27/2018    There is a nodule within the proximal aspect of the sigmoid colon measuring 2.2 by 2.2 cm. In comparison to prior CTs and PET-CT there was a hypermetabolic nodule at this location on the PET-CT of 08/27/2017 and on the CT of 09/08/2018 there was a small residual nodule. At that time (09/08/2018) the nodule measured 1.4 by 1.3 cm. Therefore this nodule has increased in the interval and concerning for recurrence of a serosal implant. The previous described lymph nodes in the sigmoid mesocolon and more central mesentery are not increased in size and not pathologic by size criteria.    Concern for recurrence of serosal  metastasis in the proximal sigmoid colon with a 2 cm enlarging lesion. Lesion extends into the lumen. No evidence of high-grade obstruction. Consider FDG PET scan and/or potential colonoscopy for evaluation.   06/29/2019 PET scan   1. Enlarging serosal implant within the proximal sigmoid colon has intense metabolic activity most consistent with malignancy. Lesion exhibits a somewhat indolent progression as present on PET-CT scan from 10/11/2017 and 05/27/2018. 2. Focal hypermetabolic activity within the RIGHT rectus muscle just off midline is slightly decreased from comparison exam. This may be benign inflammation related to prior laparotomy, however malignancy not excluded.   07/26/2019 Procedure   She had colonoscopy which showed multiple polyps.  6 polyps were removed from the cecum, measuring 3 to 6 mm in size.  One 12 mm polyp was removed from ascending colon and another 1 measures 7 mm.  One 5 mm polyp was removed from the transverse colon.  There is tumor noted in the sigmoid colon, approximately 35 cm from the anus which was biopsied.  The tumor appeared to be fungating, infiltrative and ulcerated but nonobstructive.  It encompassed approximately one third of the circumference of the lumen.   07/26/2019 Pathology Results   Multiple polyps came back tubular adenomas.  2 ascending polyps came back sessile serrated adenoma months.  Sigmoid colon biopsy confirmed metastatic high-grade serous carcinoma.  The morphology and immunophenotype are consistent with metastatic high-grade serous carcinoma from tubal/ovarian primary site.   08/17/2019 - 12/12/2019 Chemotherapy   The patient had carboplatin and taxol   10/23/2019 Imaging   1. Sigmoid lesion with diminished size/conspicuity difficult to measure on the previous  exam. Adjacent lymph nodes and nodularity less than a cm, largest on coronal image 48 measuring 5 mm. 2. Right rectus muscle with some thickening with mildly convex margin seen posteriorly  on image 72 of series 2. This could be due to postsurgical change, however, metastatic disease is not excluded. Attention on follow-up is suggested. 3. No evidence for new metastatic disease. 4. Small hiatal hernia. 5. 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   Aortic Atherosclerosis (ICD10-I70.0).   01/01/2020 Imaging   1. No signs of new disease. 2. Tiny lymph nodes in the area of concern in the LEFT lower quadrant z. 3. Added density and subtle contour irregularity involving the rectus muscles best seen on sagittal images, associated with site of prior surgical incision just to the RIGHT of midline (image 72, series 2 and image 58, series 5. Not significantly changed compared to prior studies. This measures approximately 2.1 x 1 cm in the sagittal plane and is less well-defined in the axial plane. Potentially postoperative change. Attention on follow-up. 4. Aortic atherosclerosis.   Aortic Atherosclerosis (ICD10-I70.0).       01/29/2020 - 04/07/2022 Chemotherapy   Patient is on Treatment Plan : Ovarian Bevacizumab     05/06/2020 Imaging   1. Status post hysterectomy and oophorectomy. 2. No specific findings of recurrent or metastatic disease in the abdomen or pelvis. 3. No change in postoperative appearance of low midline incision. 4. Unchanged small prominent subcentimeter left iliac lymph nodes.   10/22/2020 Imaging   1. Status post hysterectomy and oophorectomy. No evidence of recurrent or metastatic disease within the abdomen or pelvis. 2. No significant change in the subcentimeter prominent left iliac lymph nodes.   04/11/2021 Imaging   1. No findings to suggest recurrent or metastatic disease in the abdomen or pelvis. 2. Aortic atherosclerosis, in addition to at least left anterior descending coronary artery disease. Assessment for potential risk factor modification, dietary therapy or pharmacologic therapy may be warranted, if clinically indicated. 3. Additional incidental  findings, as above.   10/15/2021 Imaging   Stable exam. No evidence of recurrent or metastatic carcinoma within the abdomen or pelvis.   Stable tiny benign right renal angiomyolipoma.   Aortic Atherosclerosis (ICD10-I70.0).     04/20/2022 Imaging   1. Stable exam. No new or progressive interval findings to suggest recurrent or metastatic disease. 2. Stable tiny benign right adrenal angiomyolipoma. 3.  Aortic Atherosclerois (ICD10-170.0)   11/10/2022 Procedure   Successful removal of an implanted RIGHT chest Port-A-Cath      Interval History: Pt reports that she has been experiencing back and left leg pain which she associates with spinal issues. She got an MRI and has follow-up with neurosurgery.   She denies new vaginal bleeding, abdominal/pelvic pain, unintentional weight loss, change in bowel or bladder habits, early satiety, bloating, nausea/vomiting. She has ongoing chronic constipation. Having mild vulvar itching.    Past Medical/Surgical History: Past Medical History:  Diagnosis Date   Burning mouth syndrome    Chronic fatigue    Constipation    Diabetes (HCC) 05/09/2018   Diarrhea    in the past after gallbladder removal   Fibromyalgia    GERD (gastroesophageal reflux disease)    History of kidney stones 07/2015   Hyperlipidemia    Hypertension    borderline not on meds    Insomnia secondary to depression with anxiety    Lumbar disc disease    Ovarian cancer (HCC) 2014/2020   met nodule in abd   Pelvic  mass in female    Pneumonia    hx of pneumonia as an infant   PONV (postoperative nausea and vomiting)    pain from gas hernia 03/06/2016   Shortness of breath    with exertion    Sleep apnea    uses CPAP   Yeast infection     Past Surgical History:  Procedure Laterality Date   ABDOMINAL HYSTERECTOMY     early 27s   APPENDECTOMY     WITH DEBULKING/BSO   CHOLECYSTECTOMY     early 55s   CYSTOSCOPY W/ URETERAL STENT PLACEMENT Left 07/09/2015   DUE TO  NEPHROLITHIASIS Procedure: CYSTOSCOPY WITH LEFT RETROGRADE PYELOGRAM/ LEFT URETERAL STENT PLACEMENT;  Surgeon: Crist Fat, MD;  Location: [facility];  Service: Urology;  Laterality: Left;   HERNIA REPAIR     INCISIONAL HERNIA REPAIR N/A 12/25/2015   WITH MESH Procedure:  INCISIONAL HERNIA REPAIR;  Surgeon: Axel Filler, MD;  Location: [facility];  Service: General;  Laterality: N/A;   INCISIONAL HERNIA REPAIR N/A 10/21/2018   Procedure: LAPAROSCOPIC INCISIONAL HERNIA REPAIR WITH MESH;  Surgeon: Axel Filler, MD;  Location: [facility];  Service: General;  Laterality: N/A;   INSERTION OF MESH N/A 12/25/2015   Procedure: INSERTION OF MESH;  Surgeon: Axel Filler, MD;  Location: [facility];  Service: General;  Laterality: N/A;   IR REMOVAL TUN ACCESS W/ PORT W/O FL MOD SED  11/10/2022   LAPAROTOMY Bilateral 11/08/2012   Procedure: EXPLORATORY LAPAROTOMY BILATERAL SALPINGO OOPHORECTOMY TUMOR DEBULKING ;  Surgeon: Rejeana Brock A. Duard Brady, MD;  Location: [facility];  Service: Gynecology;  Laterality: Bilateral;  APPENDECTOMY / OMENTECTOMY   LAPAROTOMY N/A 12/25/2015   Procedure: EXPLORATORY LAPAROTOMY;  Surgeon: Axel Filler, MD;  Location: [facility];  Service: General;  Laterality: N/A;   LYSIS OF ADHESION N/A 12/25/2015   Procedure: LYSIS OF ADHESION;  Surgeon: Axel Filler, MD;  Location: [facility];  Service: General;  Laterality: N/A;   PARTIAL HYSTERECTOMY  Mar 06, 2005   TRIGGER FINGER RELEASE      Family History  Problem Relation Age of Onset   Lung cancer Mother 52   High blood pressure Mother    High Cholesterol Mother    Lung cancer Father 44       2 ppd smoker   Parkinson's disease Father    Kidney disease Other     Social History   Socioeconomic History   Marital status: Widowed    Spouse name: Adela Glimpse   Number of children: 2   Years of education: Master's   Highest education level: Not on file  Occupational History   Not on file  Tobacco Use   Smoking status: Never   Smokeless tobacco: Never   Vaping Use   Vaping Use: Never used  Substance and Sexual Activity   Alcohol use: No   Drug use: No   Sexual activity: Not on file  Other Topics Concern   Not on file  Social History Narrative   Patient is married Adela Glimpse). Husband passed away in 03-07-15   Patient has 2 children by birth and 13 children all together.   Patient has a Scientist, water quality.   Patient is right-handed.   Patient drinks very little caffeine.         Social Determinants of Health   Financial Resource Strain: Not on file  Food Insecurity: Not on file  Transportation Needs: Not on file  Physical Activity: Not on file  Stress: Not on file  Social Connections: Not on  file    Current Medications:  Current Outpatient Medications:    acetaminophen (TYLENOL) 500 MG tablet, Take 500 mg by mouth every 6 (six) hours as needed for mild pain., Disp: , Rfl:    amLODipine (NORVASC) 10 MG tablet, Take 1 tablet (10 mg total) by mouth daily., Disp: 30 tablet, Rfl: 11   B Complex-C (B-COMPLEX WITH VITAMIN C) tablet, Take 1 tablet by mouth daily., Disp: , Rfl:    busPIRone (BUSPAR) 15 MG tablet, Take 15 mg by mouth 2 (two) times daily., Disp: , Rfl:    calcium carbonate (OSCAL) 1500 (600 Ca) MG TABS tablet, Take by mouth daily with breakfast., Disp: , Rfl:    chlorthalidone (HYGROTON) 25 MG tablet, Take 25 mg by mouth daily., Disp: , Rfl:    cholecalciferol (VITAMIN D3) 25 MCG (1000 UT) tablet, Take 1,000 Units by mouth daily., Disp: , Rfl:    colestipol (COLESTID) 1 g tablet, Take 2 g by mouth at bedtime. 1 tablet in the evening, 1/2 tablet in the morning, Disp: , Rfl:    Eszopiclone 3 MG TABS, TAKE ONE TABLET BY MOUTH EVERYDAY AT BEDTIME, Disp: 30 tablet, Rfl: 5   Evolocumab (REPATHA SURECLICK) 140 MG/ML SOAJ, Inject 1 mL into the skin every 14 (fourteen) days., Disp: 2 mL, Rfl: 11   FIBER PO, Take by mouth., Disp: , Rfl:    FLUoxetine (PROZAC) 20 MG capsule, Take 40 mg by mouth every morning., Disp: , Rfl:    loperamide  (IMODIUM) 2 MG capsule, Take by mouth as needed for diarrhea or loose stools., Disp: , Rfl:    Melatonin 10 MG TABS, Take by mouth., Disp: , Rfl:    metFORMIN (GLUCOPHAGE-XR) 500 MG 24 hr tablet, Take 500 mg by mouth daily with breakfast. , Disp: , Rfl: 3   metoprolol tartrate (LOPRESSOR) 25 MG tablet, Take 1 tablet (25 mg total) by mouth 2 (two) times daily., Disp: 60 tablet, Rfl: 3   modafinil (PROVIGIL) 200 MG tablet, Take 1 tablet (200 mg total) by mouth every morning., Disp: 30 tablet, Rfl: 5   Multiple Vitamin (MULTIVITAMIN WITH MINERALS) TABS, Take 1 tablet by mouth every morning. Womens 50+, Disp: , Rfl:    rosuvastatin (CRESTOR) 40 MG tablet, , Disp: , Rfl:    telmisartan (MICARDIS) 40 MG tablet, Take 40 mg by mouth daily., Disp: , Rfl:   Review of Symptoms: Complete 10-system review is positive for: Urinary frequency, joint pain, easy bruising/bleeding, decreased concentration, chronic diarrhea, back pain, fatigue, muscle cramps, itch  Physical Exam: BP (!) 149/85 (BP Location: Left Arm, Patient Position: Sitting)   Pulse 70   Temp 98 F (36.7 C) (Oral)   Resp 16   Ht 4' 11.06" (1.5 m)   Wt 192 lb (87.1 kg)   SpO2 100%   BMI 38.71 kg/m  General: Alert, oriented, no acute distress. HEENT: Normocephalic, atraumatic. Neck symmetric without masses. Sclera anicteric.  Chest: Normal work of breathing. Clear to auscultation bilaterally.   Cardiovascular: Regular rate and rhythm, no murmurs. Abdomen: Soft, nontender.  Normoactive bowel sounds.  No masses or hepatosplenomegaly appreciated.  Well-healed incisions. Extremities: Grossly normal range of motion.  Warm, well perfused.  No edema bilaterally. Skin: No rashes or lesions noted. Lymphatics: No cervical, supraclavicular, or inguinal adenopathy. GU: Normal appearing external genitalia without erythema, excoriation, or lesions. Speculum exam reveals atrophic vaginal mucosa.  Bimanual exam reveals smooth vaginal cuff. No nodularity  or pelvic mass.  Rectovaginal exam negative. Exam chaperoned by Asher Muir  Jim Desanctis, RN    Laboratory & Radiologic Studies:  CT CHEST ABDOMEN PELVIS W CONTRAST 10/15/2022  Narrative CLINICAL DATA:  History of ovarian cancer, carcinoma fallopian tube. Monitor. * Tracking Code: BO *  EXAM: CT CHEST, ABDOMEN, AND PELVIS WITH CONTRAST  TECHNIQUE: Multidetector CT imaging of the chest, abdomen and pelvis was performed following the standard protocol during bolus administration of intravenous contrast.  RADIATION DOSE REDUCTION: This exam was performed according to the departmental dose-optimization program which includes automated exposure control, adjustment of the mA and/or kV according to patient size and/or use of iterative reconstruction technique.  CONTRAST:  OMNIPAQUE IOHEXOL 300 MG/ML  SOLN  COMPARISON:  Multiple priors including most recent CT April 18, 2022.  FINDINGS: CT CHEST FINDINGS  Cardiovascular: Accessed right chest Port-A-Cath with tip at the superior cavoatrial junction. Aortic atherosclerosis. No central pulmonary embolus on this nondedicated study. Normal size heart. No significant pericardial effusion/thickening.  Mediastinum/Nodes: Prominent mediastinal lymph nodes are similar dating back to PET-CT June 29, 2019. No pathologically enlarged hilar or axillary lymph nodes. Gas fluid levels in a patulous esophagus.  Lungs/Pleura: No suspicious pulmonary nodules or masses. Scarring/atelectasis in the lingula and right middle lobe. No pleural effusion. No pneumothorax.  Musculoskeletal: No aggressive lytic or blastic lesion of bone. Multilevel degenerative changes spine with bridging anterior vertebral osteophytes.  CT ABDOMEN PELVIS FINDINGS  Hepatobiliary: Stable 15 mm exophytic lesion extending from the medial segment of the left lobe of the liver on image 48/2. No new suspicious hepatic lesion. Gallbladder surgically absent.  Similar prominence of the biliary tree favored reservoir effect post cholecystectomy.  Pancreas: No pancreatic ductal dilation or evidence of acute inflammation.  Spleen: No splenomegaly or focal splenic lesion.  Adrenals/Urinary Tract: Bilateral adrenal glands appear normal. No hydronephrosis. Stable benign 10 mm right interpolar angiomyolipoma which requires no independent imaging follow-up. Urinary bladder is unremarkable for degree of distension.  Stomach/Bowel: Radiopaque enteric contrast material traverses the descending colon. Small hiatal hernia. Stomach is minimally distended limiting evaluation. No pathologic dilation of small or large bowel. No evidence of acute bowel inflammation.  Vascular/Lymphatic: Aortic atherosclerosis. Smooth IVC contours. No pathologically enlarged abdominal or pelvic lymph nodes.  Reproductive: Status post hysterectomy. No enhancing soft tissue nodularity along the vaginal cuff. No suspicious adnexal masses.  Other: No significant abdominopelvic free fluid. No discrete peritoneal or omental nodularity.  Musculoskeletal: No aggressive lytic or blastic lesion of bone. Multilevel degenerative changes spine  IMPRESSION: 1. Stable examination without evidence of new or progressive disease within the chest, abdomen or pelvis. 2. Small hiatal hernia with gas fluid levels in a patulous esophagus, correlate for gastroesophageal reflux. 3.  Aortic Atherosclerosis (ICD10-I70.0).   Electronically Signed By: Maudry Mayhew M.D. On: 10/15/2022 14:29

## 2023-01-26 DIAGNOSIS — Z6838 Body mass index (BMI) 38.0-38.9, adult: Secondary | ICD-10-CM | POA: Diagnosis not present

## 2023-01-26 DIAGNOSIS — M5126 Other intervertebral disc displacement, lumbar region: Secondary | ICD-10-CM | POA: Diagnosis not present

## 2023-01-31 ENCOUNTER — Encounter: Payer: Self-pay | Admitting: Psychiatry

## 2023-02-25 ENCOUNTER — Other Ambulatory Visit: Payer: Self-pay | Admitting: Internal Medicine

## 2023-02-25 DIAGNOSIS — E782 Mixed hyperlipidemia: Secondary | ICD-10-CM

## 2023-02-25 DIAGNOSIS — I2510 Atherosclerotic heart disease of native coronary artery without angina pectoris: Secondary | ICD-10-CM

## 2023-02-26 DIAGNOSIS — E782 Mixed hyperlipidemia: Secondary | ICD-10-CM | POA: Diagnosis not present

## 2023-02-26 DIAGNOSIS — M159 Polyosteoarthritis, unspecified: Secondary | ICD-10-CM | POA: Diagnosis not present

## 2023-02-26 DIAGNOSIS — E1159 Type 2 diabetes mellitus with other circulatory complications: Secondary | ICD-10-CM | POA: Diagnosis not present

## 2023-02-26 DIAGNOSIS — I10 Essential (primary) hypertension: Secondary | ICD-10-CM | POA: Diagnosis not present

## 2023-03-08 DIAGNOSIS — M5126 Other intervertebral disc displacement, lumbar region: Secondary | ICD-10-CM | POA: Diagnosis not present

## 2023-03-08 DIAGNOSIS — Z6838 Body mass index (BMI) 38.0-38.9, adult: Secondary | ICD-10-CM | POA: Diagnosis not present

## 2023-03-12 DIAGNOSIS — M5416 Radiculopathy, lumbar region: Secondary | ICD-10-CM | POA: Diagnosis not present

## 2023-03-23 DIAGNOSIS — H903 Sensorineural hearing loss, bilateral: Secondary | ICD-10-CM | POA: Diagnosis not present

## 2023-03-29 DIAGNOSIS — M5416 Radiculopathy, lumbar region: Secondary | ICD-10-CM | POA: Diagnosis not present

## 2023-03-29 DIAGNOSIS — Z6838 Body mass index (BMI) 38.0-38.9, adult: Secondary | ICD-10-CM | POA: Diagnosis not present

## 2023-04-14 ENCOUNTER — Telehealth: Payer: Self-pay | Admitting: Neurology

## 2023-04-14 ENCOUNTER — Other Ambulatory Visit: Payer: Self-pay | Admitting: Neurology

## 2023-04-14 ENCOUNTER — Other Ambulatory Visit: Payer: Self-pay

## 2023-04-14 MED ORDER — MODAFINIL 200 MG PO TABS: 200.0000 mg | ORAL_TABLET | Freq: Every morning | ORAL | 5 refills | Status: DC

## 2023-04-14 MED ORDER — ESZOPICLONE 3 MG PO TABS: ORAL_TABLET | ORAL | 5 refills | Status: DC

## 2023-04-14 NOTE — Telephone Encounter (Signed)
Since [facility] is no longer in business pt is asking that eszopiclone (LUNESTA) 2 MG TABS tablet  and modafinil (PROVIGIL) 200 MG tablet, both be called into [facility] [phone]

## 2023-04-15 NOTE — Telephone Encounter (Signed)
Pt called and LVM asking for these medications to be sent in to her new Pharmacy. Please advise.

## 2023-04-15 NOTE — Telephone Encounter (Signed)
The script have been sent to the pharmacy and received confirmation that it went through to the [facility] pharmacy requested.

## 2023-04-16 ENCOUNTER — Ambulatory Visit
Admission: RE | Admit: 2023-04-16 | Discharge: 2023-04-16 | Disposition: A | Discharge status: Home / Self Care | Payer: Medicare HMO | Source: Ambulatory Visit | Attending: Family Medicine | Admitting: Family Medicine

## 2023-04-16 DIAGNOSIS — M8588 Other specified disorders of bone density and structure, other site: Secondary | ICD-10-CM | POA: Diagnosis not present

## 2023-04-16 DIAGNOSIS — E349 Endocrine disorder, unspecified: Secondary | ICD-10-CM | POA: Diagnosis not present

## 2023-04-16 DIAGNOSIS — N958 Other specified menopausal and perimenopausal disorders: Secondary | ICD-10-CM | POA: Diagnosis not present

## 2023-04-16 DIAGNOSIS — Z90722 Acquired absence of ovaries, bilateral: Secondary | ICD-10-CM | POA: Diagnosis not present

## 2023-04-16 DIAGNOSIS — M85852 Other specified disorders of bone density and structure, left thigh: Secondary | ICD-10-CM

## 2023-04-22 ENCOUNTER — Inpatient Hospital Stay: Payer: Medicare HMO | Attending: Gynecologic Oncology | Admitting: Hematology and Oncology

## 2023-04-22 ENCOUNTER — Inpatient Hospital Stay: Payer: Medicare HMO

## 2023-04-22 ENCOUNTER — Telehealth: Payer: Self-pay | Admitting: Hematology and Oncology

## 2023-04-28 DIAGNOSIS — H903 Sensorineural hearing loss, bilateral: Secondary | ICD-10-CM | POA: Diagnosis not present

## 2023-04-29 ENCOUNTER — Encounter: Payer: Self-pay | Admitting: Hematology and Oncology

## 2023-04-29 ENCOUNTER — Inpatient Hospital Stay: Payer: Medicare HMO | Admitting: Hematology and Oncology

## 2023-04-29 ENCOUNTER — Inpatient Hospital Stay: Payer: Medicare HMO | Attending: Psychiatry

## 2023-04-29 VITALS — BP 122/86 | HR 65 | Temp 99.2°F | Resp 18 | Ht 59.06 in | Wt 192.8 lb

## 2023-04-29 DIAGNOSIS — Z9221 Personal history of antineoplastic chemotherapy: Secondary | ICD-10-CM | POA: Insufficient documentation

## 2023-04-29 DIAGNOSIS — Z90710 Acquired absence of both cervix and uterus: Secondary | ICD-10-CM | POA: Diagnosis not present

## 2023-04-29 DIAGNOSIS — Z90722 Acquired absence of ovaries, bilateral: Secondary | ICD-10-CM | POA: Insufficient documentation

## 2023-04-29 DIAGNOSIS — C5701 Malignant neoplasm of right fallopian tube: Secondary | ICD-10-CM

## 2023-04-29 DIAGNOSIS — C5700 Malignant neoplasm of unspecified fallopian tube: Secondary | ICD-10-CM | POA: Diagnosis not present

## 2023-04-29 DIAGNOSIS — Z8544 Personal history of malignant neoplasm of other female genital organs: Secondary | ICD-10-CM | POA: Diagnosis not present

## 2023-04-29 DIAGNOSIS — D696 Thrombocytopenia, unspecified: Secondary | ICD-10-CM | POA: Insufficient documentation

## 2023-04-29 LAB — CBC WITH DIFFERENTIAL/PLATELET
Abs Immature Granulocytes: 0.02 10*3/uL (ref 0.00–0.07)
Basophils Absolute: 0 10*3/uL (ref 0.0–0.1)
Basophils Relative: 1 %
Eosinophils Absolute: 0.1 10*3/uL (ref 0.0–0.5)
Eosinophils Relative: 2 %
HCT: 41 % (ref 36.0–46.0)
Hemoglobin: 13.4 g/dL (ref 12.0–15.0)
Immature Granulocytes: 0 %
Lymphocytes Relative: 27 %
Lymphs Abs: 1.6 10*3/uL (ref 0.7–4.0)
MCH: 30.3 pg (ref 26.0–34.0)
MCHC: 32.7 g/dL (ref 30.0–36.0)
MCV: 92.8 fL (ref 80.0–100.0)
Monocytes Absolute: 0.5 10*3/uL (ref 0.1–1.0)
Monocytes Relative: 9 %
Neutro Abs: 3.6 10*3/uL (ref 1.7–7.7)
Neutrophils Relative %: 61 %
Platelets: 145 10*3/uL — ABNORMAL LOW (ref 150–400)
RBC: 4.42 MIL/uL (ref 3.87–5.11)
RDW: 13.3 % (ref 11.5–15.5)
WBC: 5.8 10*3/uL (ref 4.0–10.5)
nRBC: 0 % (ref 0.0–0.2)

## 2023-04-29 LAB — COMPREHENSIVE METABOLIC PANEL
ALT: 30 U/L (ref 0–44)
AST: 24 U/L (ref 15–41)
Albumin: 4.3 g/dL (ref 3.5–5.0)
Alkaline Phosphatase: 76 U/L (ref 38–126)
Anion gap: 7 (ref 5–15)
BUN: 17 mg/dL (ref 8–23)
CO2: 30 mmol/L (ref 22–32)
Calcium: 9.7 mg/dL (ref 8.9–10.3)
Chloride: 103 mmol/L (ref 98–111)
Creatinine, Ser: 0.73 mg/dL (ref 0.44–1.00)
GFR, Estimated: >60 mL/min (ref >60)
Glucose, Bld: 114 mg/dL — ABNORMAL HIGH (ref 70–99)
Potassium: 3.9 mmol/L (ref 3.5–5.1)
Sodium: 140 mmol/L (ref 135–145)
Total Bilirubin: 0.5 mg/dL (ref 0.3–1.2)
Total Protein: 6.8 g/dL (ref 6.5–8.1)

## 2023-04-29 NOTE — Assessment & Plan Note (Signed)
Her thrombocytopenia is stable Observe only for now

## 2023-04-29 NOTE — Progress Notes (Signed)
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[facility]) Her last CT imaging is reviewed with her which showed no abnormalities I plan to see her again in 6 months for further follow-up I plan to repeat imaging study next year  Thrombocytopenia (HCC) Her thrombocytopenia is stable Observe only for now  Orders Placed This Encounter  Procedures   CT ABDOMEN PELVIS W CONTRAST    Standing Status:   Future    Standing Expiration Date:   04/28/2024    Order Specific Question:   If indicated for the ordered procedure, I authorize the administration of contrast media per Radiology protocol    Answer:   Yes    Order Specific Question:   Does the patient have a contrast media/X-ray dye allergy?    Answer:   No    Order Specific Question:   Preferred imaging location?    Answer:   [facility]    Order Specific Question:   If indicated for the ordered procedure, I authorize the administration of oral contrast media per Radiology protocol    Answer:   Yes    All questions were answered. The patient knows to call the clinic with any problems, questions or concerns. The total time spent in the appointment was 20 minutes encounter with patients including review of chart and various tests results, discussions about plan of care and coordination of care plan   Kristin Delay, MD 04/29/2023 12:57 PM  INTERVAL HISTORY: Please see below for problem oriented charting. she returns for surveillance follow-up for history of recurrent fallopian tube cancer She denies abdominal pain or changes in bowel habits The patient have herniated disc and will be undergoing neurosurgery in November with Dr. Franky Webb  REVIEW OF SYSTEMS:   Constitutional: Denies fevers, chills or  abnormal weight loss Eyes: Denies blurriness of vision Ears, nose, mouth, throat, and face: Denies mucositis or sore throat Respiratory: Denies cough, dyspnea or wheezes Cardiovascular: Denies palpitation, chest discomfort or lower extremity swelling Gastrointestinal:  Denies nausea, heartburn or change in bowel habits Skin: Denies abnormal skin rashes Lymphatics: Denies new lymphadenopathy or easy bruising Neurological:Denies numbness, tingling or new weaknesses Behavioral/Psych: Mood is stable, no new changes  All other systems were reviewed with the patient and are negative.  I have reviewed the past medical history, past surgical history, social history and family history with the patient and they are unchanged from previous note.  ALLERGIES:  is allergic to [employer] tartrate], bee venom, and cortisone.  MEDICATIONS:  Current Outpatient Medications  Medication Sig Dispense Refill   acetaminophen (TYLENOL) 500 MG tablet Take 500 mg by mouth every 6 (six) hours as needed for mild pain.     amLODipine (NORVASC) 10 MG tablet Take 1 tablet (10 mg total) by mouth daily. 30 tablet 11   B Complex-C (B-COMPLEX WITH VITAMIN C) tablet Take 1 tablet by mouth daily.     busPIRone (BUSPAR) 15 MG tablet Take 15 mg by mouth 2 (two) times daily.     calcium carbonate (OSCAL) 1500 (600 Ca) MG TABS tablet Take by mouth daily with breakfast.     chlorthalidone (HYGROTON) 25 MG tablet Take 25 mg by mouth daily.     cholecalciferol (VITAMIN D3) 25 MCG (1000 UT) tablet Take 1,000 Units by  mouth daily.     colestipol (COLESTID) 1 g tablet Take 2 g by mouth at bedtime. 1 tablet in the evening, 1/2 tablet in the morning     Eszopiclone 3 MG TABS TAKE ONE TABLET BY MOUTH EVERYDAY AT BEDTIME 30 tablet 5   Evolocumab (REPATHA SURECLICK) 140 MG/ML SOAJ INJECT 1ml into THE SKIN FOR FOURTEEN DAYS 2 mL 6   FIBER PO Take by mouth.     FLUoxetine (PROZAC) 20 MG capsule Take 40 mg by mouth every morning.      loperamide (IMODIUM) 2 MG capsule Take by mouth as needed for diarrhea or loose stools.     Melatonin 10 MG TABS Take by mouth.     metFORMIN (GLUCOPHAGE-XR) 500 MG 24 hr tablet Take 500 mg by mouth daily with breakfast.   3   metoprolol tartrate (LOPRESSOR) 25 MG tablet Take 1 tablet (25 mg total) by mouth 2 (two) times daily. 60 tablet 3   modafinil (PROVIGIL) 200 MG tablet Take 1 tablet (200 mg total) by mouth every morning. 30 tablet 5   Multiple Vitamin (MULTIVITAMIN WITH MINERALS) TABS Take 1 tablet by mouth every morning. Womens 50+     rosuvastatin (CRESTOR) 40 MG tablet      telmisartan (MICARDIS) 40 MG tablet Take 40 mg by mouth daily.     No current facility-administered medications for this visit.    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  Interval development of large mid abdominal mass highly concerning for right ovarian cancer.  There is mild omental nodularity on the left, and peritoneal disease cannot be completely excluded.  There is no ascites or other evidence of metastatic disease. 2.  Mild associated renal pelvocaliectasis bilaterally without obstruction.  Nonobstructing left renal calculus and a small right renal angiomyolipoma noted incidentally.   11/08/2012 Pathology Results   1. Ovary and fallopian tube, right - OVARIAN ATYPICAL PROLIFERATING MUCINOUS TUMOR  (BORDERLINE TUMOR) (28 CM), SEE COMMENT. 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 - BENIGN FALLOPIAN TUBE WITH NONSPECIFIC CHRONIC INFLAMMATION. 2. Ovary and fallopian tube, left - BENIGN OVARY; NEGATIVE FOR ATYPIA OR MALIGNANCY. - BENIGN FALLOPIAN TUBE; NEGATIVE FOR ATYPIA OR MALIGNANCY. 3. Omentum, resection for tumor - HIGH GRADE CARCINOMA, SEE COMMENT. 4. Appendix, Other than Incidental - FIBROUS OBLITERATION OF APPENDICEAL TIP. - NEGATIVE FOR MALIGNANCY.   11/08/2012 Surgery   Surgery: Exploratory laparotomy, bilateral salpingo-oophorectomy, appendectomy, infacolic omentectomy, optimal debulking   Surgeons:  Paola A. Duard Brady, MD; Antionette Char, MD    Assistant: Telford Nab  Pathology: Bilateral fallopian tubes and ovaries to pathology. Appendix as well as omentum. 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   Operative findings: 25 cm right adnexal mass with smooth surface. Surgically absent uterus. Atrophic-appearing left ovary. Normal appearing appendix. Within the omentum there were centimeter nodules scattered throughout the omentum. The remainder of the surfaces were benign.  12/08/2012 Procedure   Impression:  Placement of a subcutaneous port device.  The catheter tip is in the lower SVC and ready to be used.     12/13/2012 - 03/28/2013 Chemotherapy   s/p 6 cycles of paclitaxel and carboplatin   12/13/2012 - 03/28/2013 Chemotherapy   The patient had 6 cycles of carboplatin and Taxol   03/24/2013 Imaging   US abdomen   04/24/2013 Imaging   CT abdomen Interval resection of the large right pelvic and lower abdominal mass lesion with apparent omentectomy.  No evidence for intraperitoneal free fluid on today's study.  No discernible peritoneal lesions.   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   09/07/2013 Genetic Testing   Patient has genetic testing done  for BRCA1/2 panel Results revealed patient has no mutation(s):   07/09/2015 Imaging   CT abdomen 1. 12 mm obstructive calculus at the left ureteropelvic junction with moderate proximal hydronephrosis. 2. 2 small supraumbilical ventral hernias, one containing a short segment of the mid transverse colon and the other containing a short segment of the mid small bowel. There is no associated evidence to suggest bowel incarceration or obstruction at this time. 3. Tiny locule of gas non dependently in the lumen of the urinary bladder. This is presumably iatrogenic related to recent catheterization for urinalysis. Alternatively, this could be seen in the setting of urinary tract infection with gas-forming organisms. Clinical correlation for history of recent catheterization is recommended. 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 5. Status post cholecystectomy. 6. Additional incidental findings, as above.     12/11/2016 Imaging   Ct abdomen 1. No evidence of metastatic ovarian cancer. 2. Recurrent subxiphoid ventral abdominal wall hernia containing transverse colon. No evidence of incarceration or obstruction. 3. Stable incidental findings in the liver and kidneys. No recurrent urinary tract calculus. 4. Progressive lower lumbar spondylosis. 5.  Aortic Atherosclerosis (ICD10-I70.0).     08/30/2017 Imaging   MRI thoracic spine 1. At T5-6 there is a small central disc protrusion contacting the ventral thoracic spinal cord. No central canal or foraminal stenosis. 2. At T9-10 there is a small right paracentral disc protrusion. 3.  No acute osseous injury of the thoracic spine. 4. No aggressive osseous lesion to suggest metastatic disease.   09/24/2017 Tumor Marker   Patient's tumor was tested for the following markers: CA-125 Results of the tumor marker test revealed 21.7   09/30/2017 Imaging   CT abdomen 1. New small clustered soft tissue nodules in the left lower quadrant  in the sigmoid mesentery, largest 1.0 cm, which could represent recurrent peritoneal tumor implants. No ascites.  2. Midline high ventral abdominal wall hernia containing a portion of the transverse colon is mildly increased in size, and without bowel complication at this time. 3. Chronic findings include: Aortic Atherosclerosis (ICD10-I70.0). Diffuse hepatic steatosis. 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   10/11/2017 PET scan   1. Nodules in the sigmoid mesentery are hypermetabolic and highly worrisome for metastatic disease. 2. Attic steatosis.     11/03/2017 Procedure   Successful CT-guided rectus abdominal muscle mass core biopsy. Path: - FOREIGN BODY GIANT CELL REACTION INVOLVING FIBROADIPOSE TISSUE AND SKELETAL MUSCLE. - NO EVIDENCE OF MALIGNANCY.   11/04/2017 Cancer Staging   Staging form: Fallopian Tube, AJCC 7th Edition - Clinical: FIGO Stage IIIC, calculated as Stage IV (rT3, N0, M1) - Signed by Kristin Delay, MD on 08/09/2019   02/2018 Imaging   CT: 1.  Continued increase in size small peritoneal nodules along the mesenteric border of the proximal sigmoid colon as well as along the serosal surface of the proximal sigmoid colon. 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 2. No evidence of distant disease. 3. Stable large ventral hernia.   05/2018 Imaging   PET: 1. Redemonstration of hypermetabolic nodules within the sigmoid mesocolon. Mild response to therapy relative to CT of 02/24/2018. Mixed response to therapy compared to the most recent PET of 10/11/2017. 2. Hypermetabolism within the right pelvic rectus musculature, increased since the prior PET.  Clinical service requested comparison to the 11/24/2017 CT. Index 10 mm nodule within the sigmoid mesocolon was similar to the 02/24/2018 CT, and as described on that exam, increased from 7 mm on 11/24/2017. More inferior nodule within the  mesocolon measures 12 mm today on image 156/4 and 8 mm on 11/24/2017.   05/2018 Imaging   CT: IMPRESSION: 1. Since 02/24/2018, decreased size of peritoneal nodules centered in the sigmoid mesocolon. 2. No evidence of new or progressive disease. 3. Hepatic steatosis. 4. Subcentimeter right renal angiomyolipoma, similar. 5.  Aortic Atherosclerosis (ICD10-I70.0). 6. Ventral abdominal wall laxity containing transverse colon, similar.   08/2018 Imaging   CT: IMPRESSION: 1. Nodules within the sigmoid mesocolon have decreased in size compared to prior. No new peritoneal or omental nodularity. 2. No evidence local recurrence the pelvis. 3. Ventral hernia contains a segment of transverse colon. No change from prior.     06/19/2019 Imaging   1. No evidence of metastatic disease in the abdomen pelvis. 2. No peritoneal or omental metastasis identified.  No free fluid. 3. Postcholecystectomy and hysterectomy. Comparison exams are made available. Comparison CT 09/08/2018 and 05/27/2018. PET-CT 05/27/2018    There is a nodule within the proximal aspect of the sigmoid colon measuring 2.2 by 2.2 cm. In comparison to prior CTs and PET-CT there was a hypermetabolic nodule at this location on the PET-CT of 08/27/2017 and on the CT of 09/08/2018 there was a small residual nodule. At that time (09/08/2018) the nodule measured 1.4 by 1.3 cm. Therefore this nodule has increased in the interval and concerning for recurrence of a serosal implant. The previous described lymph nodes in the sigmoid mesocolon and more central mesentery are not increased in size and not pathologic by size criteria.    Concern for recurrence of serosal metastasis in the proximal sigmoid colon with a 2 cm enlarging lesion. Lesion extends into the lumen. No evidence of high-grade obstruction. Consider FDG PET scan and/or potential colonoscopy for evaluation.   06/29/2019 PET scan   1. Enlarging serosal implant within the proximal  sigmoid colon has intense metabolic activity most consistent with malignancy. Lesion exhibits a somewhat indolent progression as present on PET-CT scan from 10/11/2017 and 05/27/2018. 2. Focal hypermetabolic activity within the RIGHT rectus muscle just off midline is slightly decreased from comparison exam. This may be benign inflammation related to prior laparotomy, however malignancy not excluded.   07/26/2019 Procedure   She had colonoscopy which showed multiple polyps.  6 polyps were removed from the cecum, measuring 3 to 6 mm in size.  One 12 mm polyp was removed from ascending colon and another 1 measures 7 mm.  One 5 mm polyp was removed from the transverse colon.  There is tumor noted in the sigmoid colon, approximately 35 cm from the anus which was biopsied.  The tumor appeared to be fungating, infiltrative and ulcerated but nonobstructive.  It encompassed approximately one third  of the circumference of the lumen.   07/26/2019 Pathology Results   Multiple polyps came back tubular adenomas.  2 ascending polyps came back sessile serrated adenoma months.  Sigmoid colon biopsy confirmed metastatic high-grade serous carcinoma.  The morphology and immunophenotype are consistent with metastatic high-grade serous carcinoma from tubal/ovarian primary site.   08/17/2019 - 12/12/2019 Chemotherapy   The patient had carboplatin and taxol   10/23/2019 Imaging   1. Sigmoid lesion with diminished size/conspicuity difficult to measure on the previous exam. Adjacent lymph nodes and nodularity less than a cm, largest on coronal image 48 measuring 5 mm. 2. Right rectus muscle with some thickening with mildly convex margin seen posteriorly on image 72 of series 2. This could be due to postsurgical change, however, metastatic disease is not excluded. Attention on follow-up is suggested. 3. No evidence for new metastatic disease. 4. Small hiatal hernia. 5. 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   Aortic  Atherosclerosis (ICD10-I70.0).   01/01/2020 Imaging   1. No signs of new disease. 2. Tiny lymph nodes in the area of concern in the LEFT lower quadrant z. 3. Added density and subtle contour irregularity involving the rectus muscles best seen on sagittal images, associated with site of prior surgical incision just to the RIGHT of midline (image 72, series 2 and image 58, series 5. Not significantly changed compared to prior studies. This measures approximately 2.1 x 1 cm in the sagittal plane and is less well-defined in the axial plane. Potentially postoperative change. Attention on follow-up. 4. Aortic atherosclerosis.   Aortic Atherosclerosis (ICD10-I70.0).       01/29/2020 - 04/07/2022 Chemotherapy   Patient is on Treatment Plan : Ovarian Bevacizumab     05/06/2020 Imaging   1. Status post hysterectomy and oophorectomy. 2. No specific findings of recurrent or metastatic disease in the abdomen or pelvis. 3. No change in postoperative appearance of low midline incision. 4. Unchanged small prominent subcentimeter left iliac lymph nodes.   10/22/2020 Imaging   1. Status post hysterectomy and oophorectomy. No evidence of recurrent or metastatic disease within the abdomen or pelvis. 2. No significant change in the subcentimeter prominent left iliac lymph nodes.   04/11/2021 Imaging   1. No findings to suggest recurrent or metastatic disease in the abdomen or pelvis. 2. Aortic atherosclerosis, in addition to at least left anterior descending coronary artery disease. Assessment for potential risk factor modification, dietary therapy or pharmacologic therapy may be warranted, if clinically indicated. 3. Additional incidental findings, as above.   10/15/2021 Imaging   Stable exam. No evidence of recurrent or metastatic carcinoma within the abdomen or pelvis.   Stable tiny benign right renal angiomyolipoma.   Aortic Atherosclerosis (ICD10-I70.0).     04/20/2022 Imaging   1. Stable exam. No new  or progressive interval findings to suggest recurrent or metastatic disease. 2. Stable tiny benign right adrenal angiomyolipoma. 3.  Aortic Atherosclerois (ICD10-170.0)   11/10/2022 Procedure   Successful removal of an implanted RIGHT chest Port-A-Cath      PHYSICAL EXAMINATION: ECOG PERFORMANCE STATUS: 1 - Symptomatic but completely ambulatory  Vitals:   04/29/23 1209  BP: 122/86  Pulse: 65  Resp: 18  Temp: 99.2 F (37.3 C)  SpO2: 98%   Filed Weights   04/29/23 1209  Weight: 192 lb 12.8 oz (87.5 kg)    GENERAL:alert, no distress and comfortable   LABORATORY DATA:  I have reviewed the data as listed    Component Value Date/Time   NA 140  04/29/2023 1134   NA 141 12/02/2016 1219   K 3.9 04/29/2023 1134   K 3.7 12/02/2016 1219   CL 103 04/29/2023 1134   CL 103 01/18/2013 1329   CO2 30 04/29/2023 1134   CO2 27 12/02/2016 1219   GLUCOSE 114 (H) 04/29/2023 1134   GLUCOSE 98 12/02/2016 1219   GLUCOSE 110 (H) 01/18/2013 1329   BUN 17 04/29/2023 1134   BUN 15.3 12/02/2016 1219   CREATININE 0.73 04/29/2023 1134   CREATININE 0.71 05/23/2021 0758   CREATININE 0.7 12/02/2016 1219   CALCIUM 9.7 04/29/2023 1134   CALCIUM 10.1 12/02/2016 1219   PROT 6.8 04/29/2023 1134   PROT 7.1 12/02/2016 1219   ALBUMIN 4.3 04/29/2023 1134   ALBUMIN 4.2 12/02/2016 1219   AST 24 04/29/2023 1134   AST 25 05/23/2021 0758   AST 21 12/02/2016 1219   ALT 30 04/29/2023 1134   ALT 26 05/23/2021 0758   ALT 27 12/02/2016 1219   ALKPHOS 76 04/29/2023 1134   ALKPHOS 104 12/02/2016 1219   BILITOT 0.5 04/29/2023 1134   BILITOT 0.6 05/23/2021 0758   BILITOT 0.37 12/02/2016 1219   GFRNONAA >60 04/29/2023 1134   GFRNONAA >60 05/23/2021 0758   GFRAA >60 05/07/2020 0833    No results found for: "SPEP", "UPEP"  Lab Results  Component Value Date   WBC 5.8 04/29/2023   NEUTROABS 3.6 04/29/2023   HGB 13.4 04/29/2023   HCT 41.0 04/29/2023   MCV 92.8 04/29/2023   PLT 145 (L) 04/29/2023       Chemistry      Component Value Date/Time   NA 140 04/29/2023 1134   NA 141 12/02/2016 1219   K 3.9 04/29/2023 1134   K 3.7 12/02/2016 1219   CL 103 04/29/2023 1134   CL 103 01/18/2013 1329   CO2 30 04/29/2023 1134   CO2 27 12/02/2016 1219   BUN 17 04/29/2023 1134   BUN 15.3 12/02/2016 1219   CREATININE 0.73 04/29/2023 1134   CREATININE 0.71 05/23/2021 0758   CREATININE 0.7 12/02/2016 1219   GLU 236 12/13/2012 1558      Component Value Date/Time   CALCIUM 9.7 04/29/2023 1134   CALCIUM 10.1 12/02/2016 1219   ALKPHOS 76 04/29/2023 1134   ALKPHOS 104 12/02/2016 1219   AST 24 04/29/2023 1134   AST 25 05/23/2021 0758   AST 21 12/02/2016 1219   ALT 30 04/29/2023 1134   ALT 26 05/23/2021 0758   ALT 27 12/02/2016 1219   BILITOT 0.5 04/29/2023 1134   BILITOT 0.6 05/23/2021 0758   BILITOT 0.37 12/02/2016 1219       RADIOGRAPHIC STUDIES: I have personally reviewed the radiological images as listed and agreed with the findings in the report. DG BONE DENSITY (DXA)  Result Date: 04/16/2023 EXAM: DUAL X-RAY ABSORPTIOMETRY (DXA) FOR BONE MINERAL DENSITY IMPRESSION: Referring Physician:  [facility]NE[facility] Your patient completed a bone mineral density test using GE Lunar iDXA system (analysis version: 16). Technologist:    lmn PATIENT: Name: Kristin Webb, Kristin Webb Patient ID: [id number] Birth Date: November 22, 1945 Height: 58.8 in. Sex: Female Measured: 04/16/2023 Weight: 189.0 lbs. Indications: Advanced Age, Bilateral Oophorectomy (65.51), Buspirone, Caucasian, Diabetic non insulin, Estrogen Deficient, Hysterectomy, Ovarian Cancer, Postmenopausal, Secondary Osteoporosis Fractures: None Treatments: Calcium (E943.0), Multivitamin, Vitamin D (E933.5) ASSESSMENT: The BMD measured at Femur Neck Right is 0.779 g/cm2 with a T-score of -1.9. This patient is considered osteopenic/low bone mass according to World Health Organization [facility]) criteria. The quality of the exam is good.  L3 and L4 were excluded due to degenerative  changes. Site Region Measured Date Measured Age YA BMD Significant CHANGE T-score DualFemur Neck Right 04/16/2023    76.2         -1.9    0.779 g/cm2 DualFemur Neck Right 08/16/2019    72.6         -1.6    0.809 g/cm2 AP Spine  L1-L2      04/16/2023    76.2         -1.2    1.024 g/cm2 AP Spine  L1-L2      08/16/2019    72.6         -1.6    0.975 g/cm2 DualFemur Total Mean 04/16/2023    76.2         -0.7    0.919 g/cm2 DualFemur Total Mean 08/16/2019    72.6         -0.5    0.944 g/cm2 World Health Organization [facility]) criteria for post-menopausal, Caucasian Women: Normal       T-score at or above -1 SD Osteopenia   T-score between -1 and -2.5 SD Osteoporosis T-score at or below -2.5 SD RECOMMENDATION: 1. All patients should optimize calcium and vitamin D intake. 2. Consider FDA-approved medical therapies in postmenopausal women and men aged 19 years and older, based on the following: a. A hip or vertebral (clinical or morphometric) fracture. b. T-score = -2.5 at the femoral neck or spine after appropriate evaluation to exclude secondary causes. c. Low bone mass (T-score between -1.0 and -2.5 at the femoral neck or spine) and a 10-year probability of a hip fracture = 3% or a 10-year probability of a major osteoporosis-related fracture = 20% based on the [place]-adapted WHO algorithm. d. Clinician judgment and/or patient preferences may indicate treatment for people with 10-year fracture probabilities above or below these levels. FOLLOW-UP: Patients with diagnosis of osteoporosis or at high risk for fracture should have regular bone mineral density tests.? Patients eligible for Medicare are allowed routine testing every 2 years.? The testing frequency can be increased to one year for patients who have rapidly progressing disease, are receiving or discontinuing medical therapy to restore bone mass, or have additional risk factors. I have reviewed this study and agree with the findings. [facility] FRAX* 10-year  Probability of Fracture Based on femoral neck BMD: DualFemur (Right) Major Osteoporotic Fracture: 11.9% Hip Fracture:                2.8% Population:                  [place] (Caucasian) Risk Factors:                Secondary Osteoporosis *FRAX is a Armed forces logistics/support/administrative officer of the [employer] of [employer] for Metabolic Bone Disease, a World Science writer (WHO) [employer]. ASSESSMENT: The probability of a major osteoporotic fracture is 11.9% within the next ten years. The probability of a hip fracture is 2.8% within the next ten years. Electronically Signed   By: Baird Lyons M.D.   On: 04/16/2023 12:02

## 2023-04-29 NOTE — Assessment & Plan Note (Signed)
Her last CT imaging is reviewed with her which showed no abnormalities I plan to see her again in 6 months for further follow-up I plan to repeat imaging study next year

## 2023-04-30 ENCOUNTER — Other Ambulatory Visit: Payer: Self-pay | Admitting: *Deleted

## 2023-05-11 NOTE — Progress Notes (Unsigned)
PATIENT: Kristin Webb DOB: 1945-09-29  REASON FOR VISIT: follow up HISTORY FROM: patient  No chief complaint on file.    HISTORY OF PRESENT ILLNESS:  05/11/23 ALL:  Kristin Webb returns for follow up for OSA on CPAP, insomnia and excessive daytime sleepiness. She continues to do well on CPAP therapy.   She continues eszopiclone 3mg  at bedtime.   She continues modafinil 200mg  daily.    10-27-2022 CD:  unpleasant dry mouth in AM, CPAP machine has a smaller reservoir. Water runs out every night by 3-4 AM and her humidifier setting is middle range at 3. Highly compliant patient - she has  been 97% by days and 93% by time of nightly use with an average of 8 hours 21 minutes.  Her CPAP is set at a pressure of 16 cm water with 3 cm expiratory relief.  Her AHI is 3.9/h there are very few central apneas arising the majority of events are hypopneas and are considered obstructive.  She does have a moderate high air leak at the 95th percentile of 33.5 L a minute.  She does sometimes snore through the machine as indicated by the RERA index of 1.5/h. [place] stokes breathing was detected.    Highly compliant patient - she has  been 97% by days and 93% by time of nightly use with an average of 8 hours 21 minutes.  Her CPAP is set at a pressure of 16 cm water with 3 cm expiratory relief.  Her AHI is 3.9/h there are very few central apneas arising the majority of events are hypopneas and are considered obstructive.  She does have a moderate high air leak at the 95th percentile of 33.5 L a minute.  She does sometimes snore through the machine as indicated by the RERA index of 1.5/h. Cheynes stokes breathing was detected. Marland Kitchen   She was cancer free on her last scans, Dr Bertis Ruddy is her oncologist. Fatigue remains. She was diagnosed with TYPE 2 DM on metformin.  Diet adjusted.   No burning mouth concerns, rather dry mouth. No more epistaxis.  04/28/2022 ALL: Kristin Webb returns for follow up for OSA on CPAP, EDS and  insomnia.   She reports doing well on her new CPAP machine. She is using it every night. She denies concerns with machine or supplies.   She continues eszopiclone 3mg  QHS. She also takes melatonin 10mg  daily. Belsomra did not help at all. Ambien caused sleep walking. Trazodone caused significant side effects. She is taking modafinil 200mg  daily.  She has stopped bevacizumab infusions. Scans have looked good. She plans follow up with oncology for 5 years with regular surveillance labs.     09/24/2020 ALL: She returns for follow up for OSA on CPAP, EDS and insomnia. She continues CPAP nightly. Lunesta 2mg  helps with insomnia. She takes armodafinil 250mg  as needed for EDS and fatigue. She is not sure if these medications are working. She reports that since the holidays, she has had difficulty waking every 3-5 hours. She is having more "negative" dreams. She has been seen by PCP who was concerned about anxiety related to stage IV cancer diagnosis on Avastin (started about 6-8 months ago). Fluoxetine was increased to 40mg  daily and she was started on buspirone 15mg  BID about 2 months ago. She is seeing a Veterinary surgeon. PCP has recommended psychiatry if not responding to fluoxetine and buspirone. She does feel medications are helping.   Compliance report dated 08/24/2020 through 09/22/2020 reveals that she used CPAP 30 of the  past 30 days for compliance of 100%.  She used CPAP greater than 4 hours 28 of the past 30 days for compliance of 93%.  Average usage on days used was 7 hours and 15 minutes.  Residual AHI was 1.7 on a set pressure of 16 cm of water.  There was a leak noted in the 95th percentile of 27.8 L/min.   09/25/2019 ALL:  Kristin Webb is a 77 y.o. female here today for follow up for OSA on CPAP.  Overall, she is doing fairly well with her CPAP machine.  She has recently restarted chemotherapy for stage IV ovarian cancer.  She is anticipating receiving her third of 6 total treatments this week.  She  will then have a PET scan to determine how long therapy will need to be continued.  There is also a question of possible need for colon resection as tumor is attached to part of her colon.  She has noted that her skin is more sensitive.  She has bags under her eyes that she feels are pinched with the current full facemask she is using. Currently using ResMed Airfit small FFM.  She does have more difficulty falling asleep.  She is now using melatonin 10 mg and Benadryl 25 mg about an hour before bedtime.  She continues Lunesta to help her stay asleep.  Armodafinil does help with daytime sleepiness.  She does not use this medication on chemotherapy days as she feels there is no benefit.  She does endorse increasing fatigue and feels this is related to her chemotherapy.  Compliance report dated 08/21/2018 08/20/2019 reveals that she used CPAP 30 out of the last 30 days for compliance of 100%.  She used CPAP greater than 4 hours 28 of the last 30 days for compliance of 93%.  Average usage was 8 hours and 44 minutes.  Residual AHI was 1.2 on 16 cm of water and EPR of 1.  There was no significant leak noted.  HISTORY: (copied from Dr Dohmeier's note on 05/23/2019)  HPI:  Kristin Webb is a 77 y.o. female patient with a history of OSA on CPAP -and persistent fatigue, is seen here in a revisit for CPAP follow up/ compliance visit.  She is a Optician, dispensing, has a large social circle. She is living alone with her rescue dog, and is optimistic and happy.  Rv 05-23-2019-I have the pleasure of looking at the data download for [name] encompassing 30 days before 21 May 2019.  She has been 100% compliant with an average usage time of 8 hours 90 minutes, minimum pressure is 5 maximum pressure is applied is 15 cmH2O with 1 cm expiratory pressure relief.  Residual AHI is 1.4 which is an excellent resolution she has minimal air leaks her 95th percentile pressure is 14.9cm  this may be related to some weight  gain. She has used melatonin, benadryl.    Kristin Webb is seen here on 09 May 2018, she has been a highly compliant CPAP user, her downloaded data however have to be combined from 2 different machines.  For general use at home she has an air sense 10 AutoSet machine and then she has a travel CPAP as well.  The average user time on days used is 8 hours and 59 minutes, she has used CPAP in one form or another for 97% of the recorded time, she is using AutoSet 5-15 cmH2O was 1 cm EPR level the 95th percentile pressure required is 14.5 which straddles  the upper limit.  Residual AHI is 0.8 which is excellent and there are no major air leaks.  She has gained weight since I saw her last and her BMI rose by over 4 points this may explain why she needs a higher setting at this time.  Her Epworth Sleepiness Scale was endorsed at 13 out of 24 possible points and her fatigue severity at 44 out of 63 possible points, the geriatric depression scale was endorsed at 1 out of 15 points.   Interval history from 05/05/2017, Mrs. Couturier is doing well, she is living by herself with her dog, recently had some home restoration to do. This added a little bit of hectic to her life, however she is doing well and her counselor has not progressed. She is followed by a new oncologist, Dr. Duard Brady. She underwent another HST following significant weight gain, AHI was 9.1 /hr. She has many desaturations and was placed on auto CPAP in response to high EDS with mild apnea.  Her compliance to CPAP with excellent at 90% with an average use of 8 hours and 32 minutes, she is using an AutoSet between 5 and 15 cm water was 1 cm EPR and her residual AHI is 1.9. This is excellent resolution of her apnea. The 95th percentile pressure is 14.8, there were no central apneas emerging, there is no need to adjust the machine in any way. She endorsed the Epworth Sleepiness Scale at 9 points, fatigue severity at 44 points and the geriatric depression  score is 1 out of 15 points. She continues to participate in a grief group after her husband's death. She started Yoga, Zen knitting and fosters dogs.    12-12-13,Since I have seen her last this patient was diagnosed with ovarian cancer, underwent surgery and chemotherapy. She currently struggles with a weak  abdominal and core musculatur  and it feels to her she had an abdominal hernia. She has tried a corsett , but this has aggravated her bulging discs in her spine.  The patient was originally referred by Dr. Abigail Miyamoto at the time with fatigue , pain, excessive sleepiness and burning mouth syndrome. In 2010 she developed transiently left mouth droop, and a TIA/ CVA work up was negative. EMG and nerve conduction studies were unrevealing , a rheumatology consult,  pain specialist , neurosurgical evaluation were unrevealing .  Labs revealed a vitamin D deficiency,  normal TSH and CMET, CBC.  Her neurosurgery consult for back pain was without results.   She was referred to me for a sleep consultation in 2009. The patient was diagnosed with sleep apnea on 03-07-08 with an AHI of 24.3 and in REM AHI of 82. Her BMI at the time was 37.9 , she was titrated to 10 cm water pressure on CPAP  which relieved her AHI but she still snored.  Mrs. Vanderford's newest  sleep study dated 11-26-13 whowed  residual apnea. The study was therefore a normal polysomnography and not split into a titration parts. The AHI was 0.9, the RDI was 2.4 and the oxygen nadir was 87% time and his saturation was 33.6 minutes at or below 90% saturation of oxygen. Heart which was regular, normal sinus rhythm prevailed. She still sleeps better with Lunesta 2 mg could not get a good result from 1 mg pills. Fragmentation of sleep, alpha intrusion can be seen in chronic pain and fatigue.She is not a coffee drinker, there is no caffeine to be cut out. There is no pain in sleep. Sleep  psychology referral is problematic with [facility]. Sleep hygiene was  discussed.    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 She has compliantly  used a  Original machine,  New the CPAP  machine seems to have failed. I am unable to obtain any data. In addition the patient had remained excessively fatigued and daytime sleepy. Possibly , this could have been a paraneoplastic manifestation of ovarian malignancy. She has been using Nuvigil as tolerated which has given her improvement in life quality and energy.     The last CPAP  download was on 05-06-12 with the 95% percentile pressure of 12 cm water, the residual AHI of 0.5 and an average of 5.9 hours of night-user time for CPAP.  Interval history 12-24-14,Mrs. 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 . Mrs. Seelinger since being diagnosed with ovarian cancer has underwent a lot of emotional changes as well. Her oncologist, Dr. Darrold Span, feels that some of the nausea is GERD-   So far since last week has not been a palpable results. The patient has also tried to wean off her sleep aids but became panicked and insomniac again.Mr. Bredemeier had been admitted to the hospital after a spot on his lung was incidentally found , while being evaluated for kidney stones. He went into acute respiratory distress was placed on oxygen oxygen and readmitted to the hospital but never recovered. And during that time she needed to return to her previous insomnia regimen. She is under financial distress, had a lot of difficulties since her husbands death.   CD Interval history from 05/18/2016, Mrs. Hedrich has been able to cut down on her Lunesta dose from 2 mg to 1 mg and uses if necessary Nuvigil to drive. She has slept usually uninterrupted as of last summer also thinks have changed again in 2017 now she begun in August to wake up 3-4 times a week in AM with a headache, very unusual for her. Nausea is  associated with these headaches, no photophobia or phonophobia is reported. And as of August she has needed and used Nuvigil 125 g daily again. Not waking up in the night, told that she snores by a roommate during a church trip. Her fatigue is reminiscent of the times when she had suffered from sleep apnea.    CD Interval history from 07/15/2016, Mrs. Guertin is here to see the results of her recent home sleep test, performed on 06/10/2016. Her AHI was 2.2 her RDI was 5 per hour there was no significant sleep apnea noted no oxygen desaturation was found, and she did not have tachycardia or bradycardia arrhythmia. Her fatigue has remained her Epworth sleepiness score has actually increased to 12 point from 10 during our last visit. She states that she often only gets up in the morning before because she has to let the dog out but then feels that she needs another hour of sleep afterwards. She also suffers from dry mouth and dry eyes. There is a possible autoimmune component, and she does suffer from fibromyalgia.  She does not endorse a significant depression score since she lost her husband she has been grieving but she has not "fallen into a deep hole". She sleeps with the help of Lunesta 1 mg and Benadryl, she has failed Ambien (which caused her to sleep walk). She was also significantly more drowsy in daytime. She could neither tolerate Ambien 10 not 5 mg.  REVIEW OF SYSTEMS: Out of a complete 14 system review of symptoms, the patient complains only of the following symptoms, insomnia, apnea, frequent waking, daytime sleepiness, snoring, dizziness, headaches and all other reviewed systems are negative.  Epworth Sleepiness Scale: 8/24   ALLERGIES: Allergies  Allergen Reactions   Ambien [Zolpidem Tartrate] Other (See Comments)    Sleep walking   Bee Venom Anaphylaxis and Swelling    Difficulty breathing, carries an EPI-pen   Cortisone Nausea And Vomiting, Swelling and Other (See Comments)     INJECTED CORTISONE ONLY, "sick to my stomach, throwing up, hand swelling, and pain."    HOME MEDICATIONS: Outpatient Medications Prior to Visit  Medication Sig Dispense Refill   acetaminophen (TYLENOL) 500 MG tablet Take 500 mg by mouth every 6 (six) hours as needed for mild pain.     amLODipine (NORVASC) 10 MG tablet Take 1 tablet (10 mg total) by mouth daily. 30 tablet 11   busPIRone (BUSPAR) 15 MG tablet Take 15 mg by mouth 2 (two) times daily.     calcium carbonate (OSCAL) 1500 (600 Ca) MG TABS tablet Take by mouth daily with breakfast.     chlorthalidone (HYGROTON) 25 MG tablet Take 25 mg by mouth daily.     cholecalciferol (VITAMIN D3) 25 MCG (1000 UT) tablet Take 1,000 Units by mouth daily.     colestipol (COLESTID) 1 g tablet Take 1 g by mouth at bedtime. Take 1 tablet in the evening, 1/2 tablet in the morning     diazepam (VALIUM) 5 MG tablet Take by mouth.     Eszopiclone 3 MG TABS TAKE ONE TABLET BY MOUTH EVERYDAY AT BEDTIME 30 tablet 5   Evolocumab (REPATHA SURECLICK) 140 MG/ML SOAJ INJECT 1ml into THE SKIN FOR FOURTEEN DAYS 2 mL 6   ezetimibe (ZETIA) 10 MG tablet Take 10 mg by mouth every morning.     FIBER PO Take by mouth.     FLUoxetine (PROZAC) 20 MG capsule Take 40 mg by mouth every morning.     loperamide (IMODIUM) 2 MG capsule Take by mouth as needed for diarrhea or loose stools.     Melatonin 10 MG TABS Take by mouth.     metFORMIN (GLUCOPHAGE-XR) 500 MG 24 hr tablet Take 500 mg by mouth daily with breakfast.   3   metoprolol tartrate (LOPRESSOR) 25 MG tablet Take 1 tablet (25 mg total) by mouth 2 (two) times daily. 60 tablet 3   modafinil (PROVIGIL) 200 MG tablet Take 1 tablet (200 mg total) by mouth every morning. 30 tablet 5   Multiple Vitamin (MULTIVITAMIN WITH MINERALS) TABS Take 1 tablet by mouth every morning. Womens 50+     rosuvastatin (CRESTOR) 40 MG tablet      telmisartan (MICARDIS) 40 MG tablet Take 40 mg by mouth daily.     No facility-administered  medications prior to visit.    PAST MEDICAL HISTORY: Past Medical History:  Diagnosis Date   Burning mouth syndrome    Chronic fatigue    Constipation    Diabetes (HCC) 05/09/2018   Diarrhea    in the past after gallbladder removal   Fibromyalgia    GERD (gastroesophageal reflux disease)    History of kidney stones 07/2015   Hyperlipidemia    Hypertension    borderline not on meds    Insomnia secondary to depression with anxiety    Lumbar disc disease    Ovarian cancer (HCC) 2014/2020   met nodule in abd   Pelvic  mass in female    Pneumonia    hx of pneumonia as an infant   PONV (postoperative nausea and vomiting)    pain from gas hernia July 04, 2016   Shortness of breath    with exertion    Sleep apnea    uses CPAP   Yeast infection     PAST SURGICAL HISTORY: Past Surgical History:  Procedure Laterality Date   ABDOMINAL HYSTERECTOMY     early 15s   APPENDECTOMY     WITH DEBULKING/BSO   CHOLECYSTECTOMY     early 45s   CYSTOSCOPY W/ URETERAL STENT PLACEMENT Left 07/09/2015   DUE TO NEPHROLITHIASIS Procedure: CYSTOSCOPY WITH LEFT RETROGRADE PYELOGRAM/ LEFT URETERAL STENT PLACEMENT;  Surgeon: Crist Fat, MD;  Location: [facility];  Service: Urology;  Laterality: Left;   HERNIA REPAIR     INCISIONAL HERNIA REPAIR N/A 12/25/2015   WITH MESH Procedure:  INCISIONAL HERNIA REPAIR;  Surgeon: Axel Filler, MD;  Location: [facility];  Service: General;  Laterality: N/A;   INCISIONAL HERNIA REPAIR N/A 10/21/2018   Procedure: LAPAROSCOPIC INCISIONAL HERNIA REPAIR WITH MESH;  Surgeon: Axel Filler, MD;  Location: [facility];  Service: General;  Laterality: N/A;   INSERTION OF MESH N/A 12/25/2015   Procedure: INSERTION OF MESH;  Surgeon: Axel Filler, MD;  Location: [facility];  Service: General;  Laterality: N/A;   IR REMOVAL TUN ACCESS W/ PORT W/O FL MOD SED  11/10/2022   LAPAROTOMY Bilateral 11/08/2012   Procedure: EXPLORATORY LAPAROTOMY BILATERAL SALPINGO OOPHORECTOMY TUMOR DEBULKING  ;  Surgeon: Rejeana Brock A. Duard Brady, MD;  Location: [facility];  Service: Gynecology;  Laterality: Bilateral;  APPENDECTOMY / OMENTECTOMY   LAPAROTOMY N/A 12/25/2015   Procedure: EXPLORATORY LAPAROTOMY;  Surgeon: Axel Filler, MD;  Location: [facility];  Service: General;  Laterality: N/A;   LYSIS OF ADHESION N/A 12/25/2015   Procedure: LYSIS OF ADHESION;  Surgeon: Axel Filler, MD;  Location: [facility];  Service: General;  Laterality: N/A;   PARTIAL HYSTERECTOMY  2005-07-04   TRIGGER FINGER RELEASE      FAMILY HISTORY: Family History  Problem Relation Age of Onset   Lung cancer Mother 55   High blood pressure Mother    High Cholesterol Mother    Lung cancer Father 78       2 ppd smoker   Parkinson's disease Father    Kidney disease Other     SOCIAL HISTORY: Social History   Socioeconomic History   Marital status: Widowed    Spouse name: Adela Glimpse   Number of children: 2   Years of education: Master's   Highest education level: Not on file  Occupational History   Not on file  Tobacco Use   Smoking status: Never   Smokeless tobacco: Never  Vaping Use   Vaping status: Never Used  Substance and Sexual Activity   Alcohol use: No   Drug use: No   Sexual activity: Not on file  Other Topics Concern   Not on file  Social History Narrative   Patient is married Adela Glimpse). Husband passed away in Jul 05, 2015   Patient has 2 children by birth and 13 children all together.   Patient has a Scientist, water quality.   Patient is right-handed.   Patient drinks very little caffeine.         Social Determinants of Health   Financial Resource Strain: Not on file  Food Insecurity: Not on file  Transportation Needs: Not on file  Physical Activity: Not on file  Stress: Not on  file  Social Connections: Not on file  Intimate Partner Violence: Not on file      PHYSICAL EXAM  There were no vitals filed for this visit.   There is no height or weight on file to calculate BMI.  Generalized: Well developed, in no acute  distress  Cardiology: normal rate and rhythm, no murmur noted Respiratory: Clear to auscultation bilaterally  Neurological examination  Mentation: Alert oriented to time, place, history taking. Follows all commands speech and language fluent Cranial nerve II-XII: Pupils were equal round reactive to light. Extraocular movements were full, visual field were full  Motor: The motor testing reveals 5 over 5 strength of all 4 extremities. Good symmetric motor tone is noted throughout.  Gait and station: Gait is normal.    DIAGNOSTIC DATA (LABS, IMAGING, TESTING) - I reviewed patient records, labs, notes, testing and imaging myself where available.      No data to display           Lab Results  Component Value Date   WBC 5.8 04/29/2023   HGB 13.4 04/29/2023   HCT 41.0 04/29/2023   MCV 92.8 04/29/2023   PLT 145 (L) 04/29/2023      Component Value Date/Time   NA 140 04/29/2023 1134   NA 141 12/02/2016 1219   K 3.9 04/29/2023 1134   K 3.7 12/02/2016 1219   CL 103 04/29/2023 1134   CL 103 01/18/2013 1329   CO2 30 04/29/2023 1134   CO2 27 12/02/2016 1219   GLUCOSE 114 (H) 04/29/2023 1134   GLUCOSE 98 12/02/2016 1219   GLUCOSE 110 (H) 01/18/2013 1329   BUN 17 04/29/2023 1134   BUN 15.3 12/02/2016 1219   CREATININE 0.73 04/29/2023 1134   CREATININE 0.71 05/23/2021 0758   CREATININE 0.7 12/02/2016 1219   CALCIUM 9.7 04/29/2023 1134   CALCIUM 10.1 12/02/2016 1219   PROT 6.8 04/29/2023 1134   PROT 7.1 12/02/2016 1219   ALBUMIN 4.3 04/29/2023 1134   ALBUMIN 4.2 12/02/2016 1219   AST 24 04/29/2023 1134   AST 25 05/23/2021 0758   AST 21 12/02/2016 1219   ALT 30 04/29/2023 1134   ALT 26 05/23/2021 0758   ALT 27 12/02/2016 1219   ALKPHOS 76 04/29/2023 1134   ALKPHOS 104 12/02/2016 1219   BILITOT 0.5 04/29/2023 1134   BILITOT 0.6 05/23/2021 0758   BILITOT 0.37 12/02/2016 1219   GFRNONAA >60 04/29/2023 1134   GFRNONAA >60 05/23/2021 0758   GFRAA >60 05/07/2020 0833   No  results found for: "CHOL", "HDL", "LDLCALC", "LDLDIRECT", "TRIG", "CHOLHDL" Lab Results  Component Value Date   HGBA1C 6.0 (H) 10/17/2018   No results found for: "VITAMINB12" No results found for: "TSH"     ASSESSMENT AND PLAN 77 y.o. year old female  has a past medical history of Burning mouth syndrome, Chronic fatigue, Constipation, Diabetes (HCC) (05/09/2018), Diarrhea, Fibromyalgia, GERD (gastroesophageal reflux disease), History of kidney stones (07/2015), Hyperlipidemia, Hypertension, Insomnia secondary to depression with anxiety, Lumbar disc disease, Ovarian cancer (HCC) (2014/2020), Pelvic mass in female, Pneumonia, PONV (postoperative nausea and vomiting), Shortness of breath, Sleep apnea, and Yeast infection. here with     ICD-10-CM   1. OSA on CPAP  G47.33     2. Adjustment insomnia  F51.02     3. Excessive daytime sleepiness  G47.19         Keirstyn continues to do well with CPAP therapy. She continues excellent compliance with CPAP therapy. She was encouraged to continue  using CPAP nightly and for greater than 4 hours each night. We will continue Lunesta 3mg  daily. She has failed Ambien, trazodone and Belsomra. Consider extended release melatonin. We have also discussed seeing psychology for sleep therapy if she wishes. She will continue modafinil 200mg  daily for daytime sleepiness. ESS 8/24. She will continue to follow up closely with oncology and PCP. She will call us with any new concerns.  She will follow-up with Dr Vickey Huger to discuss options for management of insomnia in 6 months, sooner if needed.  She verbalizes understanding and agreement with this plan.   No orders of the defined types were placed in this encounter.    No orders of the defined types were placed in this encounter.     I spent 25 minutes with the patient. 50% of this time was spent counseling and educating patient on plan of care and medications.    Shawnie Dapper, FNP-C 05/11/2023, 8:31 AM [facility] [address] [place], [place] [address] [phone]

## 2023-05-11 NOTE — Patient Instructions (Signed)
Please continue using your CPAP regularly. While your insurance requires that you use CPAP at least 4 hours each night on 70% of the nights, I recommend, that you not skip any nights and use it throughout the night if you can. Getting used to CPAP and staying with the treatment long term does take time and patience and discipline. Untreated obstructive sleep apnea when it is moderate to severe can have an adverse impact on cardiovascular health and raise her risk for heart disease, arrhythmias, hypertension, congestive heart failure, stroke and diabetes. Untreated obstructive sleep apnea causes sleep disruption, nonrestorative sleep, and sleep deprivation. This can have an impact on your day to day functioning and cause daytime sleepiness and impairment of cognitive function, memory loss, mood disturbance, and problems focussing. Using CPAP regularly can improve these symptoms.  We will update supply orders, today. Continue eszopiclone 3mg  every night at bedtime and modafinil 200mg  daily.   Follow up in 6 months

## 2023-05-12 ENCOUNTER — Telehealth: Payer: Self-pay | Admitting: Internal Medicine

## 2023-05-12 ENCOUNTER — Telehealth: Payer: Self-pay

## 2023-05-12 DIAGNOSIS — E782 Mixed hyperlipidemia: Secondary | ICD-10-CM

## 2023-05-12 DIAGNOSIS — I2510 Atherosclerotic heart disease of native coronary artery without angina pectoris: Secondary | ICD-10-CM

## 2023-05-12 MED ORDER — REPATHA SURECLICK 140 MG/ML ~~LOC~~ SOAJ
140.0000 mg | SUBCUTANEOUS | 11 refills | Status: DC
Start: 2023-05-12 — End: 2023-10-19

## 2023-05-12 NOTE — Telephone Encounter (Signed)
Refill sent in

## 2023-05-12 NOTE — Telephone Encounter (Signed)
*  STAT* If patient is at the pharmacy, call can be transferred to refill team.   1. Which medications need to be refilled? (please list name of each medication and dose if known)   Evolocumab (REPATHA SURECLICK) 140 MG/ML SOAJ    2. Would you like to learn more about the convenience, safety, & potential cost savings by using the [facility]?   3. Are you open to using the [facility] (Type [facility]. ).  4. Which pharmacy/location (including street and city if local pharmacy) is medication to be sent to?  [id number] - [place], [place] - [address] AT [address] [facility] [address]   5. Do they need a 30 day or 90 day supply?  30 day  Patient stated she is completely out of this medication.

## 2023-05-12 NOTE — Telephone Encounter (Signed)
LVM for pt to bring her CPAP Machine for her appointment on tomorrow 05/13/2023 so that we can download her Compliance Data.  Please See MyChart message from today 05/12/2023

## 2023-05-13 ENCOUNTER — Ambulatory Visit: Payer: Medicare HMO | Admitting: Family Medicine

## 2023-05-13 ENCOUNTER — Encounter: Payer: Self-pay | Admitting: Family Medicine

## 2023-05-13 VITALS — BP 125/70 | HR 66 | Ht 59.0 in | Wt 190.0 lb

## 2023-05-13 DIAGNOSIS — G4719 Other hypersomnia: Secondary | ICD-10-CM | POA: Diagnosis not present

## 2023-05-13 DIAGNOSIS — G4733 Obstructive sleep apnea (adult) (pediatric): Secondary | ICD-10-CM

## 2023-05-13 DIAGNOSIS — F5102 Adjustment insomnia: Secondary | ICD-10-CM

## 2023-05-17 ENCOUNTER — Telehealth: Payer: Self-pay | Admitting: Internal Medicine

## 2023-05-17 DIAGNOSIS — E782 Mixed hyperlipidemia: Secondary | ICD-10-CM

## 2023-05-17 NOTE — Telephone Encounter (Signed)
Pt c/o medication issue:  1. Name of Medication:  Evolocumab (REPATHA SURECLICK) 140 MG/ML SOAJ  2. How are you currently taking this medication (dosage and times per day)?   3. Are you having a reaction (difficulty breathing--STAT)?   4. What is your medication issue?   Patient states she is in the donut hole and this medication will cost her $400.

## 2023-05-17 NOTE — Telephone Encounter (Signed)
Maisie Fus, MD  Cv Div Nl Triage; Jeb Levering M, RN6 minutes ago (11:00 AM)   MB Understandable. Yes, can order follow up lipids    Patient aware MD has ordered lipid panel. Lab slip mailed.

## 2023-05-17 NOTE — Telephone Encounter (Signed)
Patient in dounut hole. Please advise for recommendations or where to apply for assistance

## 2023-05-17 NOTE — Telephone Encounter (Signed)
Order placed for lipids

## 2023-05-17 NOTE — Telephone Encounter (Signed)
She states spoke with pharmacy and it is not affordable that way either.  She is aware she may not get approved for the assistance.  She is currently on Rosuvastatin and Zetia.  She states she is due to take Repatha now.  She is going to stop the Repatha.  She would like to have her labs done and then see her Lipids to see how she is doing without the medication. Please advise if OK to order labs for Lipid for 3 months out.

## 2023-05-17 NOTE — Telephone Encounter (Signed)
Right now there is no assistance with our usual grant. [facility]. Would recommend filling just a 30 day for now. She can try applying for assistance with [employer]. This is hit or miss. Although not the best option, but she could take every 3 weeks until the end of the year.

## 2023-05-20 DIAGNOSIS — E1121 Type 2 diabetes mellitus with diabetic nephropathy: Secondary | ICD-10-CM | POA: Diagnosis not present

## 2023-05-20 DIAGNOSIS — F411 Generalized anxiety disorder: Secondary | ICD-10-CM | POA: Diagnosis not present

## 2023-05-20 DIAGNOSIS — E782 Mixed hyperlipidemia: Secondary | ICD-10-CM | POA: Diagnosis not present

## 2023-05-20 DIAGNOSIS — M5416 Radiculopathy, lumbar region: Secondary | ICD-10-CM | POA: Diagnosis not present

## 2023-05-20 DIAGNOSIS — I10 Essential (primary) hypertension: Secondary | ICD-10-CM | POA: Diagnosis not present

## 2023-05-20 DIAGNOSIS — D696 Thrombocytopenia, unspecified: Secondary | ICD-10-CM | POA: Diagnosis not present

## 2023-05-20 DIAGNOSIS — Z6837 Body mass index (BMI) 37.0-37.9, adult: Secondary | ICD-10-CM | POA: Diagnosis not present

## 2023-05-20 DIAGNOSIS — K529 Noninfective gastroenteritis and colitis, unspecified: Secondary | ICD-10-CM | POA: Diagnosis not present

## 2023-05-20 DIAGNOSIS — Z23 Encounter for immunization: Secondary | ICD-10-CM | POA: Diagnosis not present

## 2023-05-25 DIAGNOSIS — Z23 Encounter for immunization: Secondary | ICD-10-CM | POA: Diagnosis not present

## 2023-06-30 DIAGNOSIS — M5126 Other intervertebral disc displacement, lumbar region: Secondary | ICD-10-CM | POA: Diagnosis not present

## 2023-07-02 ENCOUNTER — Telehealth: Payer: Self-pay | Admitting: Neurology

## 2023-07-02 NOTE — Telephone Encounter (Signed)
[employer]/ shardlie calling checking on medication list review faxed on 06/09/23.  Re-faxing today to [phone].

## 2023-07-05 NOTE — Telephone Encounter (Signed)
I received the information and was able to get that completed and sent back to [facility]

## 2023-07-20 ENCOUNTER — Encounter: Payer: Self-pay | Admitting: Psychiatry

## 2023-07-26 ENCOUNTER — Encounter: Payer: Self-pay | Admitting: Psychiatry

## 2023-07-26 ENCOUNTER — Inpatient Hospital Stay: Payer: Medicare HMO | Attending: Psychiatry | Admitting: Psychiatry

## 2023-07-26 VITALS — BP 131/66 | HR 75 | Temp 98.1°F | Resp 20 | Wt 183.6 lb

## 2023-07-26 DIAGNOSIS — Z90710 Acquired absence of both cervix and uterus: Secondary | ICD-10-CM | POA: Insufficient documentation

## 2023-07-26 DIAGNOSIS — Z9221 Personal history of antineoplastic chemotherapy: Secondary | ICD-10-CM | POA: Diagnosis not present

## 2023-07-26 DIAGNOSIS — C5702 Malignant neoplasm of left fallopian tube: Secondary | ICD-10-CM

## 2023-07-26 DIAGNOSIS — Z8544 Personal history of malignant neoplasm of other female genital organs: Secondary | ICD-10-CM | POA: Insufficient documentation

## 2023-07-26 DIAGNOSIS — Z90722 Acquired absence of ovaries, bilateral: Secondary | ICD-10-CM | POA: Insufficient documentation

## 2023-07-26 DIAGNOSIS — Z9079 Acquired absence of other genital organ(s): Secondary | ICD-10-CM | POA: Diagnosis not present

## 2023-07-26 DIAGNOSIS — C5700 Malignant neoplasm of unspecified fallopian tube: Secondary | ICD-10-CM

## 2023-07-26 NOTE — Progress Notes (Signed)
Gynecologic Oncology Return Clinic Visit  Date of Service: 07/26/2023 Referring Provider: Artis Delay, MD  Assessment & Plan: Kristin Webb is a 77 y.o. woman with recurrent platinum sensitive fallopian tube carcinoma, recurrence treated with carboplatin/paclitaxel (completed 12/2019) followed by maintenance bevacizumab through 04/2022. She presents today for surveillance.   Fallopian tube carcinoma: - NED on exam today. - Signs/symptoms of recurrence reviewed. - Dr. Bertis Ruddy plans annual imaging (ordered but not yet scheduled).  - CA125 does not appear to be a sensitive marker for her. - Reviewed plan for q3 month surveillance visits, alternating between me and Dr. Bertis Ruddy.   RTC 3 months with Dr. Bertis Ruddy, 6 months with Gyn Onc.  Clide Cliff, MD Gynecologic Oncology   Medical Decision Making I personally spent TOTAL 15 minutes face-to-face and non-face-to-face in the care of this patient, which includes all pre, intra, and post visit time on the date of service.  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  Interval development of large mid abdominal mass highly concerning for right ovarian cancer.  There is mild omental nodularity on the left, and peritoneal disease cannot be completely excluded.  There is no ascites or other evidence  of metastatic disease. 2.  Mild associated renal pelvocaliectasis bilaterally without obstruction.  Nonobstructing left renal calculus and a small right renal angiomyolipoma noted incidentally.   11/08/2012 Pathology Results   1. 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 - BENIGN FALLOPIAN TUBE WITH NONSPECIFIC CHRONIC INFLAMMATION. 2. Ovary and fallopian tube, left - BENIGN OVARY; NEGATIVE FOR ATYPIA OR MALIGNANCY. - BENIGN FALLOPIAN TUBE; NEGATIVE FOR ATYPIA OR MALIGNANCY. 3. Omentum, resection for tumor - HIGH GRADE CARCINOMA, SEE COMMENT. 4. Appendix, Other than Incidental - FIBROUS OBLITERATION OF APPENDICEAL TIP. - NEGATIVE FOR MALIGNANCY.   11/08/2012 Surgery   Surgery: Exploratory laparotomy, bilateral salpingo-oophorectomy, appendectomy, infacolic omentectomy, optimal debulking   Surgeons:  Paola A. Duard Brady, MD; Antionette Char, MD    Assistant: Telford Nab  Pathology: Bilateral fallopian tubes and ovaries to pathology. Appendix as well as omentum. 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   Operative findings: 25 cm right adnexal mass with smooth surface. Surgically absent uterus. Atrophic-appearing left ovary. Normal appearing appendix. Within the omentum there were centimeter nodules scattered throughout the omentum. The remainder of the surfaces were benign.   12/08/2012 Procedure   Impression:  Placement of a subcutaneous port device.  The catheter tip is in the lower SVC and ready to be used.     12/13/2012 - 03/28/2013 Chemotherapy   s/p 6 cycles of paclitaxel and carboplatin   12/13/2012 - 03/28/2013 Chemotherapy   The patient had 6 cycles of carboplatin and Taxol   03/24/2013 Imaging   US abdomen   04/24/2013 Imaging   CT  abdomen Interval resection of  the large right pelvic and lower abdominal mass lesion with apparent omentectomy.  No evidence for intraperitoneal free fluid on today's study.  No discernible peritoneal lesions.   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   09/07/2013 Genetic Testing   Patient has genetic testing done for BRCA1/2 panel Results revealed patient has no mutation(s):   07/09/2015 Imaging   CT abdomen 1. 12 mm obstructive calculus at the left ureteropelvic junction with moderate proximal hydronephrosis. 2. 2 small supraumbilical ventral hernias, one containing a short segment of the mid transverse colon and the other containing a short segment of the mid small bowel. There is no associated evidence to suggest bowel incarceration or obstruction at this time. 3. Tiny locule of gas non dependently in the lumen of the urinary bladder. This is presumably iatrogenic related to recent catheterization for urinalysis. Alternatively, this could be seen in the setting of urinary tract infection with gas-forming organisms. Clinical correlation for history of recent catheterization is recommended. 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 5. Status post cholecystectomy. 6. Additional incidental findings, as above.     12/11/2016 Imaging   Ct abdomen 1. No evidence of metastatic ovarian cancer. 2. Recurrent subxiphoid ventral abdominal wall hernia containing transverse colon. No evidence of incarceration or obstruction. 3. Stable incidental findings in the liver and kidneys. No recurrent urinary tract calculus. 4. Progressive lower lumbar spondylosis. 5.  Aortic Atherosclerosis (ICD10-I70.0).     08/30/2017 Imaging   MRI thoracic spine 1. At T5-6 there is a small central disc protrusion contacting the ventral thoracic spinal cord. No central canal or foraminal stenosis. 2. At T9-10 there is a small right paracentral disc protrusion. 3.  No acute osseous injury of the  thoracic spine. 4. No aggressive osseous lesion to suggest metastatic disease.   09/24/2017 Tumor Marker   Patient's tumor was tested for the following markers: CA-125 Results of the tumor marker test revealed 21.7   09/30/2017 Imaging   CT abdomen 1. New small clustered soft tissue nodules in the left lower quadrant in the sigmoid mesentery, largest 1.0 cm, which could represent recurrent peritoneal tumor implants. No ascites.  2. Midline high ventral abdominal wall hernia containing a portion of the transverse colon is mildly increased in size, and without bowel complication at this time. 3. Chronic findings include: Aortic Atherosclerosis (ICD10-I70.0). Diffuse hepatic steatosis. 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   10/11/2017 PET scan   1. Nodules in the sigmoid mesentery are hypermetabolic and highly worrisome for metastatic disease. 2. Attic steatosis.     11/03/2017 Procedure   Successful CT-guided rectus abdominal muscle mass core biopsy. Path: - FOREIGN BODY GIANT CELL REACTION INVOLVING FIBROADIPOSE TISSUE AND SKELETAL MUSCLE. - NO EVIDENCE OF MALIGNANCY.   11/04/2017 Cancer Staging   Staging form: Fallopian Tube, AJCC 7th Edition - Clinical: FIGO Stage IIIC, calculated as Stage IV (rT3, N0, M1) - Signed by Artis Delay, MD on 08/09/2019   02/2018 Imaging   CT: 1. Continued increase in size small peritoneal nodules along the mesenteric border of the proximal sigmoid colon as well as along the serosal surface of the proximal sigmoid colon. 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 2. No evidence of distant disease. 3. Stable large ventral hernia.   05/2018 Imaging   PET: 1. Redemonstration of hypermetabolic nodules within the sigmoid mesocolon. Mild response to therapy relative to CT of 02/24/2018. Mixed response to therapy compared to the  most recent PET of 10/11/2017. 2. Hypermetabolism  within the right pelvic rectus musculature, increased since the prior PET.  Clinical service requested comparison to the 11/24/2017 CT. Index 10 mm nodule within the sigmoid mesocolon was similar to the 02/24/2018 CT, and as described on that exam, increased from 7 mm on 11/24/2017. More inferior nodule within the mesocolon measures 12 mm today on image 156/4 and 8 mm on 11/24/2017.   05/2018 Imaging   CT: IMPRESSION: 1. Since 02/24/2018, decreased size of peritoneal nodules centered in the sigmoid mesocolon. 2. No evidence of new or progressive disease. 3. Hepatic steatosis. 4. Subcentimeter right renal angiomyolipoma, similar. 5.  Aortic Atherosclerosis (ICD10-I70.0). 6. Ventral abdominal wall laxity containing transverse colon, similar.   08/2018 Imaging   CT: IMPRESSION: 1. Nodules within the sigmoid mesocolon have decreased in size compared to prior. No new peritoneal or omental nodularity. 2. No evidence local recurrence the pelvis. 3. Ventral hernia contains a segment of transverse colon. No change from prior.     06/19/2019 Imaging   1. No evidence of metastatic disease in the abdomen pelvis. 2. No peritoneal or omental metastasis identified.  No free fluid. 3. Postcholecystectomy and hysterectomy. Comparison exams are made available. Comparison CT 09/08/2018 and 05/27/2018. PET-CT 05/27/2018    There is a nodule within the proximal aspect of the sigmoid colon measuring 2.2 by 2.2 cm. In comparison to prior CTs and PET-CT there was a hypermetabolic nodule at this location on the PET-CT of 08/27/2017 and on the CT of 09/08/2018 there was a small residual nodule. At that time (09/08/2018) the nodule measured 1.4 by 1.3 cm. Therefore this nodule has increased in the interval and concerning for recurrence of a serosal implant. The previous described lymph nodes in the sigmoid mesocolon and more central mesentery are not increased in size and not pathologic by size criteria.     Concern for recurrence of serosal metastasis in the proximal sigmoid colon with a 2 cm enlarging lesion. Lesion extends into the lumen. No evidence of high-grade obstruction. Consider FDG PET scan and/or potential colonoscopy for evaluation.   06/29/2019 PET scan   1. Enlarging serosal implant within the proximal sigmoid colon has intense metabolic activity most consistent with malignancy. Lesion exhibits a somewhat indolent progression as present on PET-CT scan from 10/11/2017 and 05/27/2018. 2. Focal hypermetabolic activity within the RIGHT rectus muscle just off midline is slightly decreased from comparison exam. This may be benign inflammation related to prior laparotomy, however malignancy not excluded.   07/26/2019 Procedure   She had colonoscopy which showed multiple polyps.  6 polyps were removed from the cecum, measuring 3 to 6 mm in size.  One 12 mm polyp was removed from ascending colon and another 1 measures 7 mm.  One 5 mm polyp was removed from the transverse colon.  There is tumor noted in the sigmoid colon, approximately 35 cm from the anus which was biopsied.  The tumor appeared to be fungating, infiltrative and ulcerated but nonobstructive.  It encompassed approximately one third of the circumference of the lumen.   07/26/2019 Pathology Results   Multiple polyps came back tubular adenomas.  2 ascending polyps came back sessile serrated adenoma months.  Sigmoid colon biopsy confirmed metastatic high-grade serous carcinoma.  The morphology and immunophenotype are consistent with metastatic high-grade serous carcinoma from tubal/ovarian primary site.   08/17/2019 - 12/12/2019 Chemotherapy   The patient had carboplatin and taxol   10/23/2019 Imaging   1. Sigmoid lesion with diminished size/conspicuity  difficult to measure on the previous exam. Adjacent lymph nodes and nodularity less than a cm, largest on coronal image 48 measuring 5 mm. 2. Right rectus muscle with some thickening with  mildly convex margin seen posteriorly on image 72 of series 2. This could be due to postsurgical change, however, metastatic disease is not excluded. Attention on follow-up is suggested. 3. No evidence for new metastatic disease. 4. Small hiatal hernia. 5. 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   Aortic Atherosclerosis (ICD10-I70.0).   01/01/2020 Imaging   1. No signs of new disease. 2. Tiny lymph nodes in the area of concern in the LEFT lower quadrant z. 3. Added density and subtle contour irregularity involving the rectus muscles best seen on sagittal images, associated with site of prior surgical incision just to the RIGHT of midline (image 72, series 2 and image 58, series 5. Not significantly changed compared to prior studies. This measures approximately 2.1 x 1 cm in the sagittal plane and is less well-defined in the axial plane. Potentially postoperative change. Attention on follow-up. 4. Aortic atherosclerosis.   Aortic Atherosclerosis (ICD10-I70.0).       01/29/2020 - 04/07/2022 Chemotherapy   Patient is on Treatment Plan : Ovarian Bevacizumab     05/06/2020 Imaging   1. Status post hysterectomy and oophorectomy. 2. No specific findings of recurrent or metastatic disease in the abdomen or pelvis. 3. No change in postoperative appearance of low midline incision. 4. Unchanged small prominent subcentimeter left iliac lymph nodes.   10/22/2020 Imaging   1. Status post hysterectomy and oophorectomy. No evidence of recurrent or metastatic disease within the abdomen or pelvis. 2. No significant change in the subcentimeter prominent left iliac lymph nodes.   04/11/2021 Imaging   1. No findings to suggest recurrent or metastatic disease in the abdomen or pelvis. 2. Aortic atherosclerosis, in addition to at least left anterior descending coronary artery disease. Assessment for potential risk factor modification, dietary therapy or pharmacologic therapy may be warranted, if clinically  indicated. 3. Additional incidental findings, as above.   10/15/2021 Imaging   Stable exam. No evidence of recurrent or metastatic carcinoma within the abdomen or pelvis.   Stable tiny benign right renal angiomyolipoma.   Aortic Atherosclerosis (ICD10-I70.0).     04/20/2022 Imaging   1. Stable exam. No new or progressive interval findings to suggest recurrent or metastatic disease. 2. Stable tiny benign right adrenal angiomyolipoma. 3.  Aortic Atherosclerois (ICD10-170.0)   11/10/2022 Procedure   Successful removal of an implanted RIGHT chest Port-A-Cath      Interval History: Patient reports that she had back surgery 1 month ago.  Her pain is getting worse and not better.  Plans to call her surgeon regarding this.  Otherwise notes some weight loss that she attributes to change in diet, in particular less snacking.  Weight loss has been about 10 pounds in the past 6 months.  Otherwise has chronic diarrhea at baseline.  Her bowels have been "all over the place "since surgery with constipation and diarrhea.  She otherwise denies new vaginal bleeding, pelvic pain, change of bladder habits, if she is eating and drinking.   Past Medical/Surgical History: Past Medical History:  Diagnosis Date   Burning mouth syndrome    Chronic fatigue    Constipation    Diabetes (HCC) 05/09/2018   Diarrhea    in the past after gallbladder removal   Fibromyalgia    GERD (gastroesophageal reflux disease)    History of kidney stones 07/2015  Hyperlipidemia    Hypertension    borderline not on meds    Insomnia secondary to depression with anxiety    Lumbar disc disease    Ovarian cancer (HCC) 2014/2020   met nodule in abd   Pelvic mass in female    Pneumonia    hx of pneumonia as an infant   PONV (postoperative nausea and vomiting)    pain from gas hernia 08/23/2016   Shortness of breath    with exertion    Sleep apnea    uses CPAP   Yeast infection     Past Surgical History:  Procedure  Laterality Date   ABDOMINAL HYSTERECTOMY     early 15s   APPENDECTOMY     WITH DEBULKING/BSO   CHOLECYSTECTOMY     early 49s   CYSTOSCOPY W/ URETERAL STENT PLACEMENT Left 07/09/2015   DUE TO NEPHROLITHIASIS Procedure: CYSTOSCOPY WITH LEFT RETROGRADE PYELOGRAM/ LEFT URETERAL STENT PLACEMENT;  Surgeon: Crist Fat, MD;  Location: [facility];  Service: Urology;  Laterality: Left;   HERNIA REPAIR     INCISIONAL HERNIA REPAIR N/A 12/25/2015   WITH MESH Procedure:  INCISIONAL HERNIA REPAIR;  Surgeon: Axel Filler, MD;  Location: [facility];  Service: General;  Laterality: N/A;   INCISIONAL HERNIA REPAIR N/A 10/21/2018   Procedure: LAPAROSCOPIC INCISIONAL HERNIA REPAIR WITH MESH;  Surgeon: Axel Filler, MD;  Location: [facility];  Service: General;  Laterality: N/A;   INSERTION OF MESH N/A 12/25/2015   Procedure: INSERTION OF MESH;  Surgeon: Axel Filler, MD;  Location: [facility];  Service: General;  Laterality: N/A;   IR REMOVAL TUN ACCESS W/ PORT W/O FL MOD SED  11/10/2022   LAPAROTOMY Bilateral 11/08/2012   Procedure: EXPLORATORY LAPAROTOMY BILATERAL SALPINGO OOPHORECTOMY TUMOR DEBULKING ;  Surgeon: Rejeana Brock A. Duard Brady, MD;  Location: [facility];  Service: Gynecology;  Laterality: Bilateral;  APPENDECTOMY / OMENTECTOMY   LAPAROTOMY N/A 12/25/2015   Procedure: EXPLORATORY LAPAROTOMY;  Surgeon: Axel Filler, MD;  Location: [facility];  Service: General;  Laterality: N/A;   LYSIS OF ADHESION N/A 12/25/2015   Procedure: LYSIS OF ADHESION;  Surgeon: Axel Filler, MD;  Location: [facility];  Service: General;  Laterality: N/A;   PARTIAL HYSTERECTOMY  August 23, 2005   TRIGGER FINGER RELEASE      Family History  Problem Relation Age of Onset   Lung cancer Mother 64   High blood pressure Mother    High Cholesterol Mother    Lung cancer Father 43       2 ppd smoker   Parkinson's disease Father    Kidney disease Other     Social History   Socioeconomic History   Marital status: Widowed    Spouse name: Adela Glimpse    Number of children: 2   Years of education: Master's   Highest education level: Not on file  Occupational History   Not on file  Tobacco Use   Smoking status: Never   Smokeless tobacco: Never  Vaping Use   Vaping status: Never Used  Substance and Sexual Activity   Alcohol use: No   Drug use: No   Sexual activity: Not on file  Other Topics Concern   Not on file  Social History Narrative   Patient is married Adela Glimpse). Husband passed away in August 24, 2015   Patient has 2 children by birth and 13 children all together.   Patient has a Scientist, water quality.   Patient is right-handed.   Patient drinks very little caffeine.  Social Drivers of Corporate investment banker Strain: Not on file  Food Insecurity: Not on file  Transportation Needs: Not on file  Physical Activity: Not on file  Stress: Not on file  Social Connections: Not on file    Current Medications:  Current Outpatient Medications:    acetaminophen (TYLENOL) 500 MG tablet, Take 500 mg by mouth every 6 (six) hours as needed for mild pain., Disp: , Rfl:    amLODipine (NORVASC) 10 MG tablet, Take 1 tablet (10 mg total) by mouth daily., Disp: 30 tablet, Rfl: 11   busPIRone (BUSPAR) 15 MG tablet, Take 15 mg by mouth 2 (two) times daily., Disp: , Rfl:    calcium carbonate (OSCAL) 1500 (600 Ca) MG TABS tablet, Take by mouth daily with breakfast., Disp: , Rfl:    chlorthalidone (HYGROTON) 25 MG tablet, Take 25 mg by mouth daily., Disp: , Rfl:    cholecalciferol (VITAMIN D3) 25 MCG (1000 UT) tablet, Take 1,000 Units by mouth daily., Disp: , Rfl:    colestipol (COLESTID) 1 g tablet, Take 1 g by mouth at bedtime. Take 1 tablet in the evening, 1/2 tablet in the morning, Disp: , Rfl:    diazepam (VALIUM) 5 MG tablet, Take by mouth., Disp: , Rfl:    Eszopiclone 3 MG TABS, TAKE ONE TABLET BY MOUTH EVERYDAY AT BEDTIME, Disp: 30 tablet, Rfl: 5   Evolocumab (REPATHA SURECLICK) 140 MG/ML SOAJ, Inject 140 mg into the skin every 14 (fourteen) days.,  Disp: 2 mL, Rfl: 11   ezetimibe (ZETIA) 10 MG tablet, Take 10 mg by mouth every morning., Disp: , Rfl:    FIBER PO, Take by mouth., Disp: , Rfl:    FLUoxetine (PROZAC) 20 MG capsule, Take 40 mg by mouth every morning., Disp: , Rfl:    loperamide (IMODIUM) 2 MG capsule, Take by mouth as needed for diarrhea or loose stools., Disp: , Rfl:    Melatonin 10 MG TABS, Take by mouth., Disp: , Rfl:    metFORMIN (GLUCOPHAGE-XR) 500 MG 24 hr tablet, Take 500 mg by mouth daily with breakfast. , Disp: , Rfl: 3   metoprolol tartrate (LOPRESSOR) 25 MG tablet, Take 1 tablet (25 mg total) by mouth 2 (two) times daily., Disp: 60 tablet, Rfl: 3   modafinil (PROVIGIL) 200 MG tablet, Take 1 tablet (200 mg total) by mouth every morning., Disp: 30 tablet, Rfl: 5   Multiple Vitamin (MULTIVITAMIN WITH MINERALS) TABS, Take 1 tablet by mouth every morning. Womens 50+, Disp: , Rfl:    rosuvastatin (CRESTOR) 40 MG tablet, , Disp: , Rfl:    telmisartan (MICARDIS) 40 MG tablet, Take 40 mg by mouth daily., Disp: , Rfl:   Review of Symptoms: Complete 10-system review is positive for: Headache, problem walking, back pain following lumbar surgery  Physical Exam: BP 131/66 (BP Location: Left Arm, Patient Position: Sitting)   Pulse 75   Temp 98.1 F (36.7 C) (Oral)   Resp 20   Wt 183 lb 9.6 oz (83.3 kg)   SpO2 98%   BMI 37.08 kg/m  General: Alert, oriented, no acute distress. HEENT: Normocephalic, atraumatic. Neck symmetric without masses. Sclera anicteric.  Chest: Normal work of breathing. Clear to auscultation bilaterally.   Cardiovascular: Regular rate and rhythm, no murmurs. Abdomen: Soft, nontender.  Normoactive bowel sounds.  No masses or hepatosplenomegaly appreciated.  Well-healed incisions. Extremities: Grossly normal range of motion.  Warm, well perfused.  No edema bilaterally. Skin: No rashes or lesions noted. Lymphatics: No cervical, supraclavicular,  or inguinal adenopathy. GU: Normal appearing external  genitalia without erythema, excoriation, or lesions. Speculum exam reveals atrophic vaginal mucosa.  Bimanual exam reveals smooth vaginal cuff. No nodularity or pelvic mass. Exam chaperoned by Kimberly [place], CMA    Laboratory & Radiologic Studies: None

## 2023-07-26 NOTE — Patient Instructions (Signed)
It was a pleasure to see you in clinic today. - Normal exam today - Return visit planned for 6 months (you see Dr. Bertis Ruddy in 3 months)  Thank you very much for allowing me to provide care for you today.  I appreciate your confidence in choosing our Gynecologic Oncology team at [facility].  If you have any questions about your visit today please call our office or send [place] a MyChart message and we will get back to you as soon as possible.

## 2023-08-19 ENCOUNTER — Telehealth: Payer: Self-pay

## 2023-08-19 NOTE — Telephone Encounter (Signed)
Called and given radiology scheduling #. She will call to schedule CT.

## 2023-08-25 ENCOUNTER — Other Ambulatory Visit (HOSPITAL_COMMUNITY): Payer: Self-pay | Admitting: Neurosurgery

## 2023-08-25 DIAGNOSIS — M5126 Other intervertebral disc displacement, lumbar region: Secondary | ICD-10-CM

## 2023-08-26 ENCOUNTER — Other Ambulatory Visit: Payer: Self-pay | Admitting: Medical Genetics

## 2023-09-06 ENCOUNTER — Ambulatory Visit (HOSPITAL_COMMUNITY)
Admission: RE | Admit: 2023-09-06 | Discharge: 2023-09-06 | Disposition: A | Discharge status: Home / Self Care | Payer: Medicare HMO | Source: Ambulatory Visit | Attending: Neurosurgery | Admitting: Neurosurgery

## 2023-09-06 DIAGNOSIS — M47817 Spondylosis without myelopathy or radiculopathy, lumbosacral region: Secondary | ICD-10-CM | POA: Diagnosis not present

## 2023-09-06 DIAGNOSIS — M48061 Spinal stenosis, lumbar region without neurogenic claudication: Secondary | ICD-10-CM | POA: Diagnosis not present

## 2023-09-06 DIAGNOSIS — M4807 Spinal stenosis, lumbosacral region: Secondary | ICD-10-CM | POA: Diagnosis not present

## 2023-09-06 DIAGNOSIS — M5126 Other intervertebral disc displacement, lumbar region: Secondary | ICD-10-CM | POA: Insufficient documentation

## 2023-09-06 DIAGNOSIS — M47816 Spondylosis without myelopathy or radiculopathy, lumbar region: Secondary | ICD-10-CM | POA: Diagnosis not present

## 2023-09-06 MED ORDER — GADOBUTROL 1 MMOL/ML IV SOLN
7.5000 mL | Freq: Once | INTRAVENOUS | Status: AC | PRN
  Administered 2023-09-06: 7.5 mL via INTRAVENOUS

## 2023-09-20 DIAGNOSIS — M25552 Pain in left hip: Secondary | ICD-10-CM | POA: Diagnosis not present

## 2023-09-20 DIAGNOSIS — M79605 Pain in left leg: Secondary | ICD-10-CM | POA: Diagnosis not present

## 2023-09-20 DIAGNOSIS — Z6834 Body mass index (BMI) 34.0-34.9, adult: Secondary | ICD-10-CM | POA: Diagnosis not present

## 2023-09-20 DIAGNOSIS — M5126 Other intervertebral disc displacement, lumbar region: Secondary | ICD-10-CM | POA: Diagnosis not present

## 2023-09-20 DIAGNOSIS — M25562 Pain in left knee: Secondary | ICD-10-CM | POA: Diagnosis not present

## 2023-09-23 DIAGNOSIS — E782 Mixed hyperlipidemia: Secondary | ICD-10-CM | POA: Diagnosis not present

## 2023-10-11 ENCOUNTER — Inpatient Hospital Stay: Payer: Medicare HMO | Attending: Hematology and Oncology

## 2023-10-11 ENCOUNTER — Ambulatory Visit (HOSPITAL_COMMUNITY)
Admission: RE | Admit: 2023-10-11 | Discharge: 2023-10-11 | Disposition: A | Discharge status: Home / Self Care | Payer: Medicare HMO | Source: Ambulatory Visit | Attending: Hematology and Oncology | Admitting: Hematology and Oncology

## 2023-10-11 DIAGNOSIS — N2889 Other specified disorders of kidney and ureter: Secondary | ICD-10-CM | POA: Diagnosis not present

## 2023-10-11 DIAGNOSIS — C5700 Malignant neoplasm of unspecified fallopian tube: Secondary | ICD-10-CM | POA: Insufficient documentation

## 2023-10-11 DIAGNOSIS — C55 Malignant neoplasm of uterus, part unspecified: Secondary | ICD-10-CM | POA: Diagnosis not present

## 2023-10-11 DIAGNOSIS — Z8544 Personal history of malignant neoplasm of other female genital organs: Secondary | ICD-10-CM | POA: Diagnosis present

## 2023-10-11 DIAGNOSIS — Z9221 Personal history of antineoplastic chemotherapy: Secondary | ICD-10-CM | POA: Insufficient documentation

## 2023-10-11 DIAGNOSIS — C5701 Malignant neoplasm of right fallopian tube: Secondary | ICD-10-CM

## 2023-10-11 LAB — COMPREHENSIVE METABOLIC PANEL
ALT: 38 U/L (ref 0–44)
AST: 25 U/L (ref 15–41)
Albumin: 4.5 g/dL (ref 3.5–5.0)
Alkaline Phosphatase: 72 U/L (ref 38–126)
Anion gap: 8 (ref 5–15)
BUN: 15 mg/dL (ref 8–23)
CO2: 30 mmol/L (ref 22–32)
Calcium: 10.8 mg/dL — ABNORMAL HIGH (ref 8.9–10.3)
Chloride: 100 mmol/L (ref 98–111)
Creatinine, Ser: 0.78 mg/dL (ref 0.44–1.00)
GFR, Estimated: >60 mL/min (ref >60)
Glucose, Bld: 114 mg/dL — ABNORMAL HIGH (ref 70–99)
Potassium: 3.5 mmol/L (ref 3.5–5.1)
Sodium: 138 mmol/L (ref 135–145)
Total Bilirubin: 0.4 mg/dL (ref 0.0–1.2)
Total Protein: 7 g/dL (ref 6.5–8.1)

## 2023-10-11 LAB — CBC WITH DIFFERENTIAL/PLATELET
Abs Immature Granulocytes: 0.03 10*3/uL (ref 0.00–0.07)
Basophils Absolute: 0 10*3/uL (ref 0.0–0.1)
Basophils Relative: 1 %
Eosinophils Absolute: 0.1 10*3/uL (ref 0.0–0.5)
Eosinophils Relative: 2 %
HCT: 40.5 % (ref 36.0–46.0)
Hemoglobin: 13.2 g/dL (ref 12.0–15.0)
Immature Granulocytes: 0 %
Lymphocytes Relative: 32 %
Lymphs Abs: 2.3 10*3/uL (ref 0.7–4.0)
MCH: 29.8 pg (ref 26.0–34.0)
MCHC: 32.6 g/dL (ref 30.0–36.0)
MCV: 91.4 fL (ref 80.0–100.0)
Monocytes Absolute: 0.6 10*3/uL (ref 0.1–1.0)
Monocytes Relative: 8 %
Neutro Abs: 4.1 10*3/uL (ref 1.7–7.7)
Neutrophils Relative %: 57 %
Platelets: 156 10*3/uL (ref 150–400)
RBC: 4.43 MIL/uL (ref 3.87–5.11)
RDW: 13.2 % (ref 11.5–15.5)
WBC: 7.2 10*3/uL (ref 4.0–10.5)
nRBC: 0 % (ref 0.0–0.2)

## 2023-10-11 MED ORDER — IOHEXOL 300 MG/ML  SOLN
30.0000 mL | Freq: Once | INTRAMUSCULAR | Status: AC | PRN
  Administered 2023-10-11: 30 mL via ORAL

## 2023-10-11 MED ORDER — IOHEXOL 300 MG/ML  SOLN
100.0000 mL | Freq: Once | INTRAMUSCULAR | Status: AC | PRN
  Administered 2023-10-11: 100 mL via INTRAVENOUS

## 2023-10-15 ENCOUNTER — Other Ambulatory Visit: Payer: Self-pay | Admitting: Neurology

## 2023-10-18 DIAGNOSIS — Z6833 Body mass index (BMI) 33.0-33.9, adult: Secondary | ICD-10-CM | POA: Diagnosis not present

## 2023-10-18 DIAGNOSIS — M5416 Radiculopathy, lumbar region: Secondary | ICD-10-CM | POA: Diagnosis not present

## 2023-10-18 NOTE — Telephone Encounter (Signed)
 Last seen on 05/13/23 Follow up scheduled on 11/22/23 Last filled on 09/10/23 #30 tablets  Rx pending to be signed

## 2023-10-19 ENCOUNTER — Encounter: Payer: Self-pay | Admitting: Hematology and Oncology

## 2023-10-19 ENCOUNTER — Inpatient Hospital Stay: Payer: Medicare HMO | Admitting: Hematology and Oncology

## 2023-10-19 VITALS — BP 128/76 | HR 65 | Temp 98.1°F | Resp 18 | Ht 59.0 in | Wt 168.2 lb

## 2023-10-19 DIAGNOSIS — C5700 Malignant neoplasm of unspecified fallopian tube: Secondary | ICD-10-CM

## 2023-10-19 DIAGNOSIS — Z9221 Personal history of antineoplastic chemotherapy: Secondary | ICD-10-CM | POA: Diagnosis not present

## 2023-10-19 NOTE — Assessment & Plan Note (Addendum)
 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), status post exploratory laparotomy, BSO, appendectomy, infracolic omentectomy, and optimal debulking (R0) and completion of adjuvant chemo 04/2013 She was subsequently found to have disease relapse in 2019 and received salvage chemotherapy with carboplatin and paclitaxel, completed by May 2021 and then remain on maintenance bevacizumab until 2023  GeneDx Breast/Ovary Panel negative (including BRCA, MMR's, RAD51 etc), [facility] BRACAnalysis  Negative for BRCA1/2 in tumor  I have reviewed recent imaging study from October 11, 2023 with the patient She has no signs or symptoms to suggest cancer recurrence CT imaging show no evidence of recurrence of disease I plan to see her again in 1 year for further follow-up with repeat imaging study

## 2023-10-19 NOTE — Progress Notes (Signed)
 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), status post exploratory laparotomy, BSO, appendectomy, infracolic omentectomy, and optimal debulking (R0) and completion of adjuvant chemo 04/2013 She was subsequently found to have disease relapse in 2019 and received salvage chemotherapy with carboplatin and paclitaxel, completed by May 2021 and then remain on maintenance bevacizumab until 2023  [facility] Breast/Ovary Panel negative (including BRCA, MMR's, RAD51 etc), [facility] BRACAnalysis  Negative for BRCA1/2 in tumor  I have reviewed recent imaging study from October 11, 2023 with the patient She has no signs or symptoms to suggest cancer recurrence CT imaging show no evidence of recurrence of disease I plan to see her again in 1 year for further follow-up with repeat imaging study   Orders Placed This Encounter  Procedures   CT ABDOMEN PELVIS W CONTRAST    Standing Status:   Future    Expected Date:   10/17/2024    Expiration Date:   10/18/2024    Scheduling Instructions:     No need oral contrast    If indicated for the ordered procedure, I authorize the administration of contrast media per Radiology protocol:   Yes    Does the patient have a contrast media/X-ray dye allergy?:   No    Preferred imaging location?:   [facility]    If indicated for the ordered procedure, I authorize the administration of oral contrast media per Radiology protocol:   Yes     Artis Delay, MD  INTERVAL HISTORY: she returns for surveillance follow-up She is doing well except  for chronic back pain of which she had multiple imaging study and MRI as well as orthopedic procedures performed She denies abdominal symptoms We discussed test results and future plan of care as outlined above I reviewed recent blood work with CBC, CMP and independently reviewed CT imaging with the patient  PHYSICAL EXAMINATION: ECOG PERFORMANCE STATUS: 1 - Symptomatic but completely ambulatory  Vitals:   10/19/23 0839  BP: 128/76  Pulse: 65  Resp: 18  Temp: 98.1 F (36.7 C)  SpO2: 97%   Filed Weights   10/19/23 0839  Weight: 168 lb 3.2 oz (76.3 kg)

## 2023-10-20 ENCOUNTER — Telehealth: Payer: Self-pay | Admitting: Hematology and Oncology

## 2023-10-20 NOTE — Telephone Encounter (Signed)
 Pt called wanting to know when this will be filled for her. Pt stated that a new Rx is needing to be sent to the pharmacy. Please advise.

## 2023-10-20 NOTE — Telephone Encounter (Signed)
 Spoke with patient confirming upcoming appointment

## 2023-10-27 DIAGNOSIS — Z0000 Encounter for general adult medical examination without abnormal findings: Secondary | ICD-10-CM | POA: Diagnosis not present

## 2023-10-27 DIAGNOSIS — Z1331 Encounter for screening for depression: Secondary | ICD-10-CM | POA: Diagnosis not present

## 2023-10-27 DIAGNOSIS — Z6841 Body Mass Index (BMI) 40.0 and over, adult: Secondary | ICD-10-CM | POA: Diagnosis not present

## 2023-11-01 DIAGNOSIS — M5416 Radiculopathy, lumbar region: Secondary | ICD-10-CM | POA: Diagnosis not present

## 2023-11-06 ENCOUNTER — Other Ambulatory Visit: Payer: Self-pay | Admitting: Neurology

## 2023-11-11 ENCOUNTER — Ambulatory Visit: Payer: Medicare HMO | Admitting: Neurology

## 2023-11-16 DIAGNOSIS — E782 Mixed hyperlipidemia: Secondary | ICD-10-CM | POA: Diagnosis not present

## 2023-11-16 DIAGNOSIS — D696 Thrombocytopenia, unspecified: Secondary | ICD-10-CM | POA: Diagnosis not present

## 2023-11-16 DIAGNOSIS — E1121 Type 2 diabetes mellitus with diabetic nephropathy: Secondary | ICD-10-CM | POA: Diagnosis not present

## 2023-11-22 ENCOUNTER — Ambulatory Visit: Payer: Medicare HMO | Admitting: Neurology

## 2023-11-22 ENCOUNTER — Encounter: Payer: Self-pay | Admitting: Neurology

## 2023-11-22 VITALS — BP 115/79 | HR 81 | Ht 59.75 in | Wt 162.0 lb

## 2023-11-22 DIAGNOSIS — G620 Drug-induced polyneuropathy: Secondary | ICD-10-CM

## 2023-11-22 DIAGNOSIS — C786 Secondary malignant neoplasm of retroperitoneum and peritoneum: Secondary | ICD-10-CM | POA: Diagnosis not present

## 2023-11-22 DIAGNOSIS — G4733 Obstructive sleep apnea (adult) (pediatric): Secondary | ICD-10-CM

## 2023-11-22 DIAGNOSIS — R530 Neoplastic (malignant) related fatigue: Secondary | ICD-10-CM

## 2023-11-22 DIAGNOSIS — T451X5A Adverse effect of antineoplastic and immunosuppressive drugs, initial encounter: Secondary | ICD-10-CM

## 2023-11-22 MED ORDER — MODAFINIL 200 MG PO TABS: 200.0000 mg | ORAL_TABLET | Freq: Every day | ORAL | 3 refills | Status: DC

## 2023-11-22 MED ORDER — ESZOPICLONE 3 MG PO TABS: ORAL_TABLET | ORAL | 5 refills | Status: DC

## 2023-11-22 NOTE — Addendum Note (Signed)
 Addended by: Neomia Banner on: 11/22/2023 12:24 PM   Modules accepted: Orders

## 2023-11-22 NOTE — Patient Instructions (Signed)
 Insomnia Insomnia is a sleep disorder that makes it difficult to fall asleep or stay asleep. Insomnia can cause fatigue, low energy, difficulty concentrating, mood swings, and poor performance at work or school. There are three different ways to classify insomnia: Difficulty falling asleep. Difficulty staying asleep. Waking up too early in the morning. Any type of insomnia can be long-term (chronic) or short-term (acute). Both are common. Short-term insomnia usually lasts for 3 months or less. Chronic insomnia occurs at least three times a week for longer than 3 months. What are the causes? Insomnia may be caused by another condition, situation, or substance, such as: Having certain mental health conditions, such as anxiety and depression. Using caffeine, alcohol, tobacco, or drugs. Having gastrointestinal conditions, such as gastroesophageal reflux disease (GERD). Having certain medical conditions. These include: Asthma. Alzheimer's disease. Stroke. Chronic pain. An overactive thyroid gland (hyperthyroidism). Other sleep disorders, such as restless legs syndrome and sleep apnea. Menopause. Sometimes, the cause of insomnia may not be known. What increases the risk? Risk factors for insomnia include: Gender. Females are affected more often than males. Age. Insomnia is more common as people get older. Stress and certain medical and mental health conditions. Lack of exercise. Having an irregular work schedule. This may include working night shifts and traveling between different time zones. What are the signs or symptoms? If you have insomnia, the main symptom is having trouble falling asleep or having trouble staying asleep. This may lead to other symptoms, such as: Feeling tired or having low energy. Feeling nervous about going to sleep. Not feeling rested in the morning. Having trouble concentrating. Feeling irritable, anxious, or depressed. How is this diagnosed? This condition  may be diagnosed based on: Your symptoms and medical history. Your health care provider may ask about: Your sleep habits. Any medical conditions you have. Your mental health. A physical exam. How is this treated? Treatment for insomnia depends on the cause. Treatment may focus on treating an underlying condition that is causing the insomnia. Treatment may also include: Medicines to help you sleep. Counseling or therapy. Lifestyle adjustments to help you sleep better. Follow these instructions at home: Eating and drinking  Limit or avoid alcohol, caffeinated beverages, and products that contain nicotine and tobacco, especially close to bedtime. These can disrupt your sleep. Do not eat a large meal or eat spicy foods right before bedtime. This can lead to digestive discomfort that can make it hard for you to sleep. Sleep habits  Keep a sleep diary to help you and your health care provider figure out what could be causing your insomnia. Write down: When you sleep. When you wake up during the night. How well you sleep and how rested you feel the next day. Any side effects of medicines you are taking. What you eat and drink. Make your bedroom a dark, comfortable place where it is easy to fall asleep. Put up shades or blackout curtains to block light from outside. Use a white noise machine to block noise. Keep the temperature cool. Limit screen use before bedtime. This includes: Not watching TV. Not using your smartphone, tablet, or computer. Stick to a routine that includes going to bed and waking up at the same times every day and night. This can help you fall asleep faster. Consider making a quiet activity, such as reading, part of your nighttime routine. Try to avoid taking naps during the day so that you sleep better at night. Get out of bed if you are still awake after  15 minutes of trying to sleep. Keep the lights down, but try reading or doing a quiet activity. When you feel  sleepy, go back to bed. General instructions Take over-the-counter and prescription medicines only as told by your health care provider. Exercise regularly as told by your health care provider. However, avoid exercising in the hours right before bedtime. Use relaxation techniques to manage stress. Ask your health care provider to suggest some techniques that may work well for you. These may include: Breathing exercises. Routines to release muscle tension. Visualizing peaceful scenes. Make sure that you drive carefully. Do not drive if you feel very sleepy. Keep all follow-up visits. This is important. Contact a health care provider if: You are tired throughout the day. You have trouble in your daily routine due to sleepiness. You continue to have sleep problems, or your sleep problems get worse. Get help right away if: You have thoughts about hurting yourself or someone else. Get help right away if you feel like you may hurt yourself or others, or have thoughts about taking your own life. Go to your nearest emergency room or: Call 911. Call the National Suicide Prevention Lifeline at 867 786 8780 or 988. This is open 24 hours a day. Text the Crisis Text Line at (708) 251-6911. Summary Insomnia is a sleep disorder that makes it difficult to fall asleep or stay asleep. Insomnia can be long-term (chronic) or short-term (acute). Treatment for insomnia depends on the cause. Treatment may focus on treating an underlying condition that is causing the insomnia. Keep a sleep diary to help you and your health care provider figure out what could be causing your insomnia. This information is not intended to replace advice given to you by your health care provider. Make sure you discuss any questions you have with your health care provider. Document Revised: 07/07/2021 Document Reviewed: 07/07/2021 Elsevier Patient Education  2024 [employer]Living With Sleep Apnea Sleep apnea is a condition that affects  your breathing while you're sleeping. Your tongue or the tissue in your throat may block the flow of air while you sleep. You may have shallow breathing or stop breathing for short periods of time. The breaks in breathing interrupt the deep sleep that you need to feel rested. Even if you don't wake up from the gaps in breathing, you may feel tired during the day. People with sleep apnea may snore loudly. You may have a headache in the morning and feel anxious or depressed. How can sleep apnea affect me? Sleep apnea increases your chances of being very tired during the day. This is called daytime fatigue. Sleep apnea can also increase your risk of: Heart attack. Stroke. Obesity. Type 2 diabetes. Heart failure. Irregular heartbeat. High blood pressure. If you are very tired during the day, you may be more likely to: Not do well in school or at work. Fall asleep while driving. Have trouble paying attention. Develop depression or anxiety. Have problems having sex. This is called sexual dysfunction. What actions can I take to manage sleep apnea? Sleep apnea treatment  If you were given a device to open your airway while you sleep, use it only as told by your health care provider. You may be given: An oral appliance. This is a mouthpiece that shifts your lower jaw forward. A continuous positive airway pressure (CPAP) device. This blows air through a mask. A nasal expiratory positive airway pressure (EPAP) device. This has valves that you put into each nostril. A bi-level positive airway pressure (BIPAP) device.  This blows air through a mask when you breathe in and breathe out. You may need surgery if other treatments don't work for you. Sleep habits Go to sleep and wake up at the same time every day. This helps set your internal clock for sleeping. If you stay up later than usual on weekends, try to get up in the morning within 2 hours of the time you usually wake up. Try to get at least 7-9  hours of sleep each night. Stop using a computer, tablet, and mobile phone a few hours before bedtime. Do not take long naps during the day. If you nap, limit it to 30 minutes. Have a relaxing bedtime routine. Reading or listening to music may relax you and help you sleep. Use your bedroom only for sleep. Keep your television and computer out of your bedroom. Keep your bedroom cool, dark, and quiet. Use a supportive mattress and pillows. Follow your provider's instructions for other changes to sleep habits. Nutrition Do not eat big meals in the evening. Do not have caffeine in the later part of the day. The effects of caffeine can last for more than 5 hours. Follow your provider's instructions for any changes to what you eat and drink. Lifestyle Do not drink alcohol before bedtime. Alcohol can cause you to fall asleep at first, but then it can cause you to wake up in the middle of the night and have trouble getting back to sleep. Do not smoke, vape, or use nicotine or tobacco. Medicines Take over-the-counter and prescription medicines only as told by your provider. Do not use over-the-counter sleep medicine. You may become dependent on this medicine, and it can make sleep apnea worse. Do not take medicines, such as sedatives and narcotics, unless told to by your provider. Activity Exercise on most days, but avoid exercising in the evening. Exercising near bedtime can interfere with sleeping. If possible, spend time outside every day. Natural light helps with your internal clock. General information Lose weight if you need to. Stay at a healthy weight. If you are having surgery, make sure to tell your provider that you have sleep apnea. You may need to bring your device with you. Keep all follow-up visits. Your provider will want to check on your condition. Where to find more information National Heart, Lung, and Blood Institute: [employer] This information is not intended to replace  advice given to you by your health care provider. Make sure you discuss any questions you have with your health care provider. Document Revised: 11/18/2022 Document Reviewed: 11/18/2022 Elsevier Patient Education  2024 [employer].

## 2023-11-22 NOTE — Progress Notes (Addendum)
 Provider:  Melvyn Novas, MD  Primary Care Physician:  Gweneth Dimitri, MD [address] [place] [address]     Referring Provider: Gweneth Dimitri, Md [address] [place],  [place] [address]          Chief Complaint according to patient   Patient presents with:                HISTORY OF PRESENT ILLNESS:  Kristin Webb is a 78 y.o. female sleep apnea patient who is here for revisit 11/22/2023 for  follow up on OSA and CPAP-  she is a cancer survivor and was reporting scalp sensitivity to the headgear of her CPAP- 'had recently a PCP visit and she is now pre-diabetic no  diabetic.  She had also  a radiculopathic pain on the left leg and L 4-5  surgery in November 2024 by Dr Franky Macho and  she had still a bit of pain post surgery.  She had spinal epidural  injections with steroids and  was 4 months pain free . She lost weight , about 20 pounds,unintended- her appetite loss being  a side effect of pain.   She walks with a cane now.- single prong .  She drinks only decaffeinated drinks after having some  lightheadedness.  She has been on a new Cholesterol lowering shot .   She plans to move into friends home and is anticipating many good things from that.    She reports ongoing problems with dry CPAP- the humidifier running out of water by hour 5 of use, she has also a travel CPAP.     Chief concern according to patient :  everything is the same ,  medications are working, HTN better controlled.     Social HX : see above, moving to [facility], excited.    CPAP:   100% compliance for days and 8.5 hours each night.  Set to 16 cm water pressure, 95% air leaks at 11.6 L/min.  Residual AHI was 2.7/h. Fisher and paykel VITERA in small. A FFM.    10-27-2022: unpleasant dry mouth in AM, CPAP machine has a smaller reservoir. Water runs out every night by 3-4 AM and her humidifier setting is middle range at 3. Highly compliant patient - she has  been 97% by  days and 93% by time of nightly use with an average of 8 hours 21 minutes.  Her CPAP is set at a pressure of 16 cm water with 3 cm expiratory relief.  Her AHI is 3.9/h there are very few central apneas arising the majority of events are hypopneas and are considered obstructive.  She does have a moderate high air leak at the 95th percentile of 33.5 L a minute.  She does sometimes snore through the machine as indicated by the RERA index of 1.5/h. Cheynes stokes breathing was detected.    Highly compliant patient - she has  been 97% by days and 93% by time of nightly use with an average of 8 hours 21 minutes.  Her CPAP is set at a pressure of 16 cm water with 3 cm expiratory relief.  Her AHI is 3.9/h there are very few central apneas arising the majority of events are hypopneas and are considered obstructive.  She does have a moderate high air leak at the 95th percentile of 33.5 L a minute.  She does sometimes snore through the machine as indicated by the RERA index of 1.5/h.  Cheynes stokes breathing was detected. Marland Kitchen       She was cancer free on her last scans, Dr Bertis Ruddy is her oncologist. Fatigue remains. She was diagnosed with TYPE 2 DM on metformin.  Diet adjusted.   No burning mouth concerns, rather dry mouth. No more epistaxis.      04/28/22 ALL:  Zamyiah returns for follow up for OSA on CPAP, EDS and insomnia. She reports doing well on her new CPAP machine. HST was done on 03-03-2022 and confirmed need for ongoing PAP therapy.  She is using it every night. She denies concerns with machine or supplies.    She continues eszopiclone 3mg  QHS. She also takes melatonin 10mg  daily. Belsomra did not help at all. Ambien caused sleep walking. Trazodone caused significant side effects. She is taking modafinil 200mg  daily.   Ovarian cancer: She has stopped bevacizumab infusions. Scans have looked good. She plans follow up with oncology for 5 years with regular surveillance labs.    Last seen 09/24/20. Here for  yearly CPAP f/u. Pt reports being up a couple times at night. Taking 2 benadryl at night w her Alfonso Patten, helps reduce it down to 1x.     HPI: I have the pleasure of meeting today again with Kristin Webb meanwhile 78 year old Caucasian widowed female patient whom I have followed for obstructive sleep apnea on CPAP and through stage IV cancer diagnosis with relapse.   She states that she is extremely fatigued which is more likely attributed to her neoplasm.  Sleepiness and the true sense of the word is not her problem or not as much.  She had some limitations in her physical exercise capabilities.  She is highly compliant with CPAP uses the machine over the last 30 days is 100% with an average time of 8 hours and 34 minutes, overall not but she estimates her sleep time to be less than 6 hours usually.  Her sleep is broken up and she has over-the-counter Benadryl taken to improvement.  Benadryl however will leave her with a dry mouth dry eyes and more fatigued so we have to find an alternative.  Common setting right now is CPAP at 16 cmH2O with an AHI of 2.0 and her 95th percentile air leak is 22.6 L a minute which is still fine as she has no central apneas emerging.  What I need to look for is how the old her machine currently is 78 years old and will be replaced after a new baseline sleep test she owns a travel machine , too. Nuvigil works for fatigue in her - I will refill. No headaches not jittery.           Review of Systems: Out of a complete 14 system review, the patient complains of only the following symptoms, and all other reviewed systems are negative.:   Social History   Socioeconomic History   Marital status: Widowed    Spouse name: Kristin Webb   Number of children: 2   Years of education: Master's   Highest education level: Not on file  Occupational History   Not on file  Tobacco Use   Smoking status: Never   Smokeless tobacco: Never  Vaping Use   Vaping status: Never Used   Substance and Sexual Activity   Alcohol use: No   Drug use: No   Sexual activity: Not on file  Other Topics Concern   Not on file  Social History Narrative   Patient is married Kristin Webb). Husband passed away in  2016   Patient has 2 children by birth and 13 children all together.   Patient has a Scientist, water quality.   Patient is right-handed.   Patient drinks very little caffeine.         Social Drivers of Corporate investment banker Strain: Not on file  Food Insecurity: Not on file  Transportation Needs: Not on file  Physical Activity: Not on file  Stress: Not on file  Social Connections: Not on file    Family History  Problem Relation Age of Onset   Lung cancer Mother 60   High blood pressure Mother    High Cholesterol Mother    Lung cancer Father 19       2 ppd smoker   Parkinson's disease Father    Kidney disease Other     Past Medical History:  Diagnosis Date   Burning mouth syndrome    Chronic fatigue    Constipation    Diabetes (HCC) 05/09/2018   Diarrhea    in the past after gallbladder removal   Fibromyalgia    GERD (gastroesophageal reflux disease)    History of kidney stones 07/2015   Hyperlipidemia    Hypertension    borderline not on meds    Insomnia secondary to depression with anxiety    Lumbar disc disease    Ovarian cancer (HCC) 2014/2020   met nodule in abd   Pelvic mass in female    Pneumonia    hx of pneumonia as an infant   PONV (postoperative nausea and vomiting)    pain from gas hernia 2017   Shortness of breath    with exertion    Sleep apnea    uses CPAP   Yeast infection     Past Surgical History:  Procedure Laterality Date   ABDOMINAL HYSTERECTOMY     early 23s   APPENDECTOMY     WITH DEBULKING/BSO   CHOLECYSTECTOMY     early 52s   CYSTOSCOPY W/ URETERAL STENT PLACEMENT Left 07/09/2015   DUE TO NEPHROLITHIASIS Procedure: CYSTOSCOPY WITH LEFT RETROGRADE PYELOGRAM/ LEFT URETERAL STENT PLACEMENT;  Surgeon: Andrez Banker,  MD;  Location: [facility];  Service: Urology;  Laterality: Left;   HERNIA REPAIR     INCISIONAL HERNIA REPAIR N/A 12/25/2015   WITH MESH Procedure:  INCISIONAL HERNIA REPAIR;  Surgeon: Shela Derby, MD;  Location: [facility];  Service: General;  Laterality: N/A;   INCISIONAL HERNIA REPAIR N/A 10/21/2018   Procedure: LAPAROSCOPIC INCISIONAL HERNIA REPAIR WITH MESH;  Surgeon: Shela Derby, MD;  Location: [facility];  Service: General;  Laterality: N/A;   INSERTION OF MESH N/A 12/25/2015   Procedure: INSERTION OF MESH;  Surgeon: Shela Derby, MD;  Location: [facility];  Service: General;  Laterality: N/A;   IR REMOVAL TUN ACCESS W/ PORT W/O FL MOD SED  11/10/2022   LAPAROTOMY Bilateral 11/08/2012   Procedure: EXPLORATORY LAPAROTOMY BILATERAL SALPINGO OOPHORECTOMY TUMOR DEBULKING ;  Surgeon: Daryel Ensign A. Clerance Dais, MD;  Location: [facility];  Service: Gynecology;  Laterality: Bilateral;  APPENDECTOMY / OMENTECTOMY   LAPAROTOMY N/A 12/25/2015   Procedure: EXPLORATORY LAPAROTOMY;  Surgeon: Shela Derby, MD;  Location: [facility];  Service: General;  Laterality: N/A;   LYSIS OF ADHESION N/A 12/25/2015   Procedure: LYSIS OF ADHESION;  Surgeon: Shela Derby, MD;  Location: [facility];  Service: General;  Laterality: N/A;   PARTIAL HYSTERECTOMY  2006   TRIGGER FINGER RELEASE       Current Outpatient Medications on File Prior to  Visit  Medication Sig Dispense Refill   acetaminophen (TYLENOL) 500 MG tablet Take 500 mg by mouth every 6 (six) hours as needed for mild pain.     amLODipine (NORVASC) 10 MG tablet Take 1 tablet (10 mg total) by mouth daily. 30 tablet 11   busPIRone (BUSPAR) 15 MG tablet Take 15 mg by mouth 2 (two) times daily.     calcium carbonate (OSCAL) 1500 (600 Ca) MG TABS tablet Take by mouth daily with breakfast.     chlorthalidone (HYGROTON) 25 MG tablet Take 25 mg by mouth daily.     cholecalciferol (VITAMIN D3) 25 MCG (1000 UT) tablet Take 1,000 Units by mouth daily.     colestipol (COLESTID) 1 g tablet Take  1 g by mouth at bedtime. Take 1 tablet in the evening, 1/2 tablet in the morning     Eszopiclone 3 MG TABS TAKE 1 TABLET BY MOUTH EVERYDAY AT BEDTIME 30 tablet 1   ezetimibe (ZETIA) 10 MG tablet Take 10 mg by mouth every morning.     FIBER PO Take by mouth.     FLUoxetine (PROZAC) 20 MG capsule Take 40 mg by mouth every morning.     loperamide (IMODIUM) 2 MG capsule Take by mouth as needed for diarrhea or loose stools.     Melatonin 10 MG TABS Take 10 mg by mouth at bedtime.     metFORMIN (GLUCOPHAGE-XR) 500 MG 24 hr tablet Take 500 mg by mouth daily with breakfast.   3   metoprolol tartrate (LOPRESSOR) 25 MG tablet Take 1 tablet (25 mg total) by mouth 2 (two) times daily. 60 tablet 3   modafinil (PROVIGIL) 200 MG tablet TAKE 1 TABLET BY MOUTH EVERY DAY IN THE MORNING 30 tablet 0   Multiple Vitamin (MULTIVITAMIN WITH MINERALS) TABS Take 1 tablet by mouth every morning. Womens 50+     rosuvastatin (CRESTOR) 40 MG tablet      telmisartan (MICARDIS) 40 MG tablet Take 40 mg by mouth daily.     No current facility-administered medications on file prior to visit.    Allergies  Allergen Reactions   Ambien [Zolpidem Tartrate] Other (See Comments)    Sleep walking   Bee Venom Anaphylaxis and Swelling    Difficulty breathing, carries an EPI-pen   Cortisone Nausea And Vomiting, Swelling and Other (See Comments)    INJECTED CORTISONE ONLY, "sick to my stomach, throwing up, hand swelling, and pain."     DIAGNOSTIC DATA (LABS, IMAGING, TESTING) - I reviewed patient records, labs, notes, testing and imaging myself where available.  Lab Results  Component Value Date   WBC 7.2 10/11/2023   HGB 13.2 10/11/2023   HCT 40.5 10/11/2023   MCV 91.4 10/11/2023   PLT 156 10/11/2023      Component Value Date/Time   NA 138 10/11/2023 0733   NA 141 12/02/2016 1219   K 3.5 10/11/2023 0733   K 3.7 12/02/2016 1219   CL 100 10/11/2023 0733   CL 103 01/18/2013 1329   CO2 30 10/11/2023 0733   CO2 27  12/02/2016 1219   GLUCOSE 114 (H) 10/11/2023 0733   GLUCOSE 98 12/02/2016 1219   GLUCOSE 110 (H) 01/18/2013 1329   BUN 15 10/11/2023 0733   BUN 15.3 12/02/2016 1219   CREATININE 0.78 10/11/2023 0733   CREATININE 0.71 05/23/2021 0758   CREATININE 0.7 12/02/2016 1219   CALCIUM 10.8 (H) 10/11/2023 0733   CALCIUM 10.1 12/02/2016 1219   PROT 7.0 10/11/2023 0733  PROT 7.1 12/02/2016 1219   ALBUMIN 4.5 10/11/2023 0733   ALBUMIN 4.2 12/02/2016 1219   AST 25 10/11/2023 0733   AST 25 05/23/2021 0758   AST 21 12/02/2016 1219   ALT 38 10/11/2023 0733   ALT 26 05/23/2021 0758   ALT 27 12/02/2016 1219   ALKPHOS 72 10/11/2023 0733   ALKPHOS 104 12/02/2016 1219   BILITOT 0.4 10/11/2023 0733   BILITOT 0.6 05/23/2021 0758   BILITOT 0.37 12/02/2016 1219   GFRNONAA >60 10/11/2023 0733   GFRNONAA >60 05/23/2021 0758   GFRAA >60 05/07/2020 0833   No results found for: "CHOL", "HDL", "LDLCALC", "LDLDIRECT", "TRIG", "CHOLHDL" Lab Results  Component Value Date   HGBA1C 6.0 (H) 10/17/2018   No results found for: "VITAMINB12" No results found for: "TSH"  PHYSICAL EXAM:  Today's Vitals   11/22/23 1132  BP: 115/79  Pulse: 81  Weight: 162 lb (73.5 kg)  Height: 4' 11.75" (1.518 m)   Body mass index is 31.9 kg/m.   Wt Readings from Last 3 Encounters:  11/22/23 162 lb (73.5 kg)  10/19/23 168 lb 3.2 oz (76.3 kg)  07/26/23 183 lb 9.6 oz (83.3 kg)     Ht Readings from Last 3 Encounters:  11/22/23 4' 11.75" (1.518 m)  10/19/23 4\' 11"  (1.499 m)  05/13/23 4\' 11"  (1.499 m)      General: The patient is awake, alert and appears not in acute distress. The patient is well groomed. Head: Normocephalic, atraumatic. Neck is supple. General: The patient is awake, alert and appears not in acute distress.  She reports fatigue, is well groomed. She has a new abdominal hernia.    Ankle edema.  Head: Normocephalic,  Neck is supple. Mallampati 3 , neck circumference: 15. Retrognathia. Dry mouth .   Dental  Decay  Cardiovascular: Regular rate and rhythm without  murmurs or carotid bruit, and without distended neck veins. Respiratory: Lungs are clear to auscultation.Skin:  Without evidence of edema, or rash,. Dry mouth, dry eyes, leg cramping. Trunk: BMI further elevated from 35 up to 41 with signifcant abdominal obesity.  Neurologic exam : The patient is awake and alert, oriented to place and time.  Memory subjective  described as intact. There is a normal attention span & concentration ability.  Speech is fluent without  dysarthria, dysphonia or aphasia. Mood and affect are appropriate.   Cranial nerves: Pupils are equal and briskly reactive to light.Visual fields by finger perimetry are intact. Hearing to finger rub intact.  Facial sensation intact to fine touch. Facial motor strength is symmetric, her tongue and uvula move in  midline. Motor exam:  Normal tone ,muscle bulk and symmetric strength in all extremities. Grip strength is improved since 2016.  Gait and station: Patient walks with her feet wide apart, increasing her base- she uses a cane  outside the home- here today without assistive device , slightly ataxic- drifting to the sides- Strength within normal limits.  Steps are unfragmented.  Deep tendon reflexes: in the upper and lower extremities are symmetric and intact.    Sensory:  Fine touch  and vibration were normal.  Proprioception tested in the upper extremities was normal.   Coordination: Rapid alternating movements in the fingers/hands were of reduced speed.  The Finger-to-nose maneuver was intact without evidence of ataxia, dysmetria or tremor.    ASSESSMENT AND PLAN 78 y.o. year old widowed cancer survivor and long established  female OSA patient  here with:    1) OSA on CPAP  and highly compliant , she loves the new mask and its improved fit. She no longer has as much sleep interruptions.   She is still running out of water between 3 and 4 AM each morning and  has to refill the reservoir.  We discussed placing a carafe next to the machine - just refill as needed.  This is unfortunately a rather frequent compliant with the current newest model of ResMed auto titration CPAP. A reduction in humidifier settings has not given her a satisfying result.  I wonder if a pulm rhythm around the fullface mask would give her a better air seal than a cloth cover.   2) longstanding high degree of fatigue is also related to history of neoplasm, chemotherapy, and not so much of depression or prolonged grief. Modafinil helps.  Lunesta 3 mg helps with sleep, ambien was not  tolerated (!!)    3) there are some sensory impairments noted manifesting as slight gait ataxia / abasia- and also is slowing and following motor commands such as the finger-nose maneuver. Her gait is  not improved but her pain is-  I am glad she uses a single prong cane now.  I don't see much benefit in gait therapy at this time.    4) she has had diplopia, wears bifocals,- now with new prisms-  and she feels her eye sight contributes now less to her balance issues.    I plan to follow up either personally or through our NP within 12 months.   I wish her the best for the upcoming move.  We will again look at  CPAP compliance at that time.   I would like to thank Helyn Lobstein, Md [address] [place],  [place] [address] for allowing me to meet with and to take care of this pleasant patient.   After spending a total time of  35  minutes face to face and additional time for physical and neurologic examination, review of laboratory studies,  personal review of imaging studies, reports and results of other testing and review of referral information / records as far as provided in visit,   Electronically signed by: Neomia Banner, MD 11/22/2023 11:35 AM  [facility] Board certified by The [employer] of Sleep Medicine and Diplomate of the [employer]  of Sleep Medicine. Board certified In Neurology through the ABPN, Fellow of the [employer] of Neurology.

## 2023-11-23 DIAGNOSIS — C5700 Malignant neoplasm of unspecified fallopian tube: Secondary | ICD-10-CM | POA: Diagnosis not present

## 2023-11-23 DIAGNOSIS — E782 Mixed hyperlipidemia: Secondary | ICD-10-CM | POA: Diagnosis not present

## 2023-11-23 DIAGNOSIS — I2510 Atherosclerotic heart disease of native coronary artery without angina pectoris: Secondary | ICD-10-CM | POA: Diagnosis not present

## 2023-11-23 DIAGNOSIS — M5416 Radiculopathy, lumbar region: Secondary | ICD-10-CM | POA: Diagnosis not present

## 2023-11-23 DIAGNOSIS — R801 Persistent proteinuria, unspecified: Secondary | ICD-10-CM | POA: Diagnosis not present

## 2023-11-23 DIAGNOSIS — E1121 Type 2 diabetes mellitus with diabetic nephropathy: Secondary | ICD-10-CM | POA: Diagnosis not present

## 2023-11-23 DIAGNOSIS — I10 Essential (primary) hypertension: Secondary | ICD-10-CM | POA: Diagnosis not present

## 2023-11-23 DIAGNOSIS — K9089 Other intestinal malabsorption: Secondary | ICD-10-CM | POA: Diagnosis not present

## 2023-11-23 DIAGNOSIS — F411 Generalized anxiety disorder: Secondary | ICD-10-CM | POA: Diagnosis not present

## 2023-12-08 DIAGNOSIS — H43813 Vitreous degeneration, bilateral: Secondary | ICD-10-CM | POA: Diagnosis not present

## 2023-12-08 DIAGNOSIS — Z961 Presence of intraocular lens: Secondary | ICD-10-CM | POA: Diagnosis not present

## 2023-12-08 DIAGNOSIS — H5111 Convergence insufficiency: Secondary | ICD-10-CM | POA: Diagnosis not present

## 2023-12-08 DIAGNOSIS — E119 Type 2 diabetes mellitus without complications: Secondary | ICD-10-CM | POA: Diagnosis not present

## 2023-12-20 DIAGNOSIS — M5416 Radiculopathy, lumbar region: Secondary | ICD-10-CM | POA: Diagnosis not present

## 2023-12-20 DIAGNOSIS — Z6832 Body mass index (BMI) 32.0-32.9, adult: Secondary | ICD-10-CM | POA: Diagnosis not present

## 2023-12-30 DIAGNOSIS — M5416 Radiculopathy, lumbar region: Secondary | ICD-10-CM | POA: Diagnosis not present

## 2023-12-30 DIAGNOSIS — M25552 Pain in left hip: Secondary | ICD-10-CM | POA: Diagnosis not present

## 2024-01-24 ENCOUNTER — Inpatient Hospital Stay: Payer: Medicare HMO | Attending: Psychiatry | Admitting: Psychiatry

## 2024-01-24 ENCOUNTER — Encounter: Payer: Self-pay | Admitting: Psychiatry

## 2024-01-24 VITALS — BP 108/61 | HR 68 | Temp 97.6°F | Resp 16 | Ht 59.75 in | Wt 161.4 lb

## 2024-01-24 DIAGNOSIS — Z8544 Personal history of malignant neoplasm of other female genital organs: Secondary | ICD-10-CM | POA: Diagnosis not present

## 2024-01-24 DIAGNOSIS — Z90722 Acquired absence of ovaries, bilateral: Secondary | ICD-10-CM | POA: Insufficient documentation

## 2024-01-24 DIAGNOSIS — Z9221 Personal history of antineoplastic chemotherapy: Secondary | ICD-10-CM | POA: Insufficient documentation

## 2024-01-24 DIAGNOSIS — Z9079 Acquired absence of other genital organ(s): Secondary | ICD-10-CM | POA: Insufficient documentation

## 2024-01-24 DIAGNOSIS — C5700 Malignant neoplasm of unspecified fallopian tube: Secondary | ICD-10-CM

## 2024-01-24 NOTE — Progress Notes (Signed)
 Gynecologic Oncology Return Clinic Visit  Date of Service: 01/24/2024 Referring Provider: Almeda Jacobs, MD  Assessment & Plan: Kristin Webb is a 78 y.o. woman with recurrent platinum sensitive fallopian tube carcinoma, recurrence treated with carboplatin /paclitaxel  (completed 12/2019) followed by maintenance bevacizumab  through 04/2022. She presents today for surveillance.   Fallopian tube carcinoma: - NED on exam today. - Last CT a/p on 10/11/23 NED - Signs/symptoms of recurrence reviewed. - Dr. Marton Sleeper plans annual imaging and follow-up (10/31/24) - CA125 does not appear to be a sensitive marker for her. - [facility] Breast/Ovary Panel negative (including BRCA, MMR's, RAD51 etc), [facility] BRACAnalysis  Negative for BRCA1/2 in tumor  - Reviewed plan for q3 month surveillance visits through 2 years NED.  RTC 3 months  Derrel Flies, MD Gynecologic Oncology   Medical Decision Making I personally spent TOTAL 15 minutes face-to-face and non-face-to-face in the care of this patient, which includes all pre, intra, and post visit time on the date of service.  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  Interval development of large mid abdominal mass highly concerning for right ovarian cancer.  There is mild omental nodularity on the left, and  peritoneal disease cannot be completely excluded.  There is no ascites or other evidence of metastatic disease. 2.  Mild associated renal pelvocaliectasis bilaterally without obstruction.  Nonobstructing left renal calculus and a small right renal angiomyolipoma noted incidentally.   11/08/2012 Pathology Results   1. 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 - BENIGN FALLOPIAN TUBE WITH NONSPECIFIC CHRONIC INFLAMMATION. 2. Ovary and fallopian tube, left - BENIGN OVARY; NEGATIVE FOR ATYPIA OR MALIGNANCY. - BENIGN FALLOPIAN TUBE; NEGATIVE FOR ATYPIA OR MALIGNANCY. 3. Omentum, resection for tumor - HIGH GRADE CARCINOMA, SEE COMMENT. 4. Appendix, Other than Incidental - FIBROUS OBLITERATION OF APPENDICEAL TIP. - NEGATIVE FOR MALIGNANCY.   11/08/2012 Surgery   Surgery: Exploratory laparotomy, bilateral salpingo-oophorectomy, appendectomy, infacolic omentectomy, optimal debulking   Surgeons:  Paola A. Clerance Dais, MD; Abdul Hodgkin, MD    Assistant: Jerlean Mood  Pathology: Bilateral fallopian tubes and ovaries to pathology. Appendix as well as omentum. 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   Operative findings: 25 cm right adnexal mass with smooth surface. Surgically absent uterus. Atrophic-appearing left ovary. Normal appearing appendix. Within the omentum there were centimeter nodules scattered throughout the omentum. The remainder of the surfaces were benign.   12/08/2012 Procedure   Impression:  Placement of a subcutaneous port device.  The catheter tip is in the lower SVC and ready to be used.     12/13/2012 - 03/28/2013 Chemotherapy   s/p 6 cycles of paclitaxel  and carboplatin    12/13/2012 - 03/28/2013 Chemotherapy   The patient had 6 cycles of carboplatin   and Taxol    03/24/2013 Imaging   US   abdomen   04/24/2013 Imaging   CT abdomen Interval resection of the large right pelvic and lower abdominal mass lesion with apparent omentectomy.  No evidence for intraperitoneal free fluid on today's study.  No discernible peritoneal lesions.   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   09/07/2013 Genetic Testing   Patient has genetic testing done for BRCA1/2 panel Results revealed patient has no mutation(s):   07/09/2015 Imaging   CT abdomen 1. 12 mm obstructive calculus at the left ureteropelvic junction with moderate proximal hydronephrosis. 2. 2 small supraumbilical ventral hernias, one containing a short segment of the mid transverse colon and the other containing a short segment of the mid small bowel. There is no associated evidence to suggest bowel incarceration or obstruction at this time. 3. Tiny locule of gas non dependently in the lumen of the urinary bladder. This is presumably iatrogenic related to recent catheterization for urinalysis. Alternatively, this could be seen in the setting of urinary tract infection with gas-forming organisms. Clinical correlation for history of recent catheterization is recommended. 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 5. Status post cholecystectomy. 6. Additional incidental findings, as above.     12/11/2016 Imaging   Ct abdomen 1. No evidence of metastatic ovarian cancer. 2. Recurrent subxiphoid ventral abdominal wall hernia containing transverse colon. No evidence of incarceration or obstruction. 3. Stable incidental findings in the liver and kidneys. No recurrent urinary tract calculus. 4. Progressive lower lumbar spondylosis. 5.  Aortic Atherosclerosis (ICD10-I70.0).     08/30/2017 Imaging   MRI thoracic spine 1. At T5-6 there is a small central disc protrusion contacting the ventral thoracic spinal cord. No central canal or foraminal stenosis. 2. At T9-10 there is a  small right paracentral disc protrusion. 3.  No acute osseous injury of the thoracic spine. 4. No aggressive osseous lesion to suggest metastatic disease.   09/24/2017 Tumor Marker   Patient's tumor was tested for the following markers: CA-125 Results of the tumor marker test revealed 21.7   09/30/2017 Imaging   CT abdomen 1. New small clustered soft tissue nodules in the left lower quadrant in the sigmoid mesentery, largest 1.0 cm, which could represent recurrent peritoneal tumor implants. No ascites.  2. Midline high ventral abdominal wall hernia containing a portion of the transverse colon is mildly increased in size, and without bowel complication at this time. 3. Chronic findings include: Aortic Atherosclerosis (ICD10-I70.0). Diffuse hepatic steatosis. 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   10/11/2017 PET scan   1. Nodules in the sigmoid mesentery are hypermetabolic and highly worrisome for metastatic disease. 2. Attic steatosis.     11/03/2017 Procedure   Successful CT-guided rectus abdominal muscle mass core biopsy. Path: - FOREIGN BODY GIANT CELL REACTION INVOLVING FIBROADIPOSE TISSUE AND SKELETAL MUSCLE. - NO EVIDENCE OF MALIGNANCY.   11/04/2017 Cancer Staging   Staging form: Fallopian Tube, AJCC 7th Edition - Clinical: FIGO Stage IIIC, calculated as Stage IV (rT3, N0, M1) - Signed by Almeda Jacobs, MD on 08/09/2019   02/2018 Imaging   CT: 1. Continued increase in size small peritoneal nodules along the mesenteric border of the proximal sigmoid colon as well as along the serosal surface of the proximal sigmoid colon. 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 2. No evidence of distant disease. 3. Stable large ventral hernia.   05/2018 Imaging   PET: 1. Redemonstration of hypermetabolic nodules within the sigmoid mesocolon. Mild response to therapy  relative to CT of 02/24/2018. Mixed response  to therapy compared to the most recent PET of 10/11/2017. 2. Hypermetabolism within the right pelvic rectus musculature, increased since the prior PET.  Clinical service requested comparison to the 11/24/2017 CT. Index 10 mm nodule within the sigmoid mesocolon was similar to the 02/24/2018 CT, and as described on that exam, increased from 7 mm on 11/24/2017. More inferior nodule within the mesocolon measures 12 mm today on image 156/4 and 8 mm on 11/24/2017.   05/2018 Imaging   CT: IMPRESSION: 1. Since 02/24/2018, decreased size of peritoneal nodules centered in the sigmoid mesocolon. 2. No evidence of new or progressive disease. 3. Hepatic steatosis. 4. Subcentimeter right renal angiomyolipoma, similar. 5.  Aortic Atherosclerosis (ICD10-I70.0). 6. Ventral abdominal wall laxity containing transverse colon, similar.   08/2018 Imaging   CT: IMPRESSION: 1. Nodules within the sigmoid mesocolon have decreased in size compared to prior. No new peritoneal or omental nodularity. 2. No evidence local recurrence the pelvis. 3. Ventral hernia contains a segment of transverse colon. No change from prior.     06/19/2019 Imaging   1. No evidence of metastatic disease in the abdomen pelvis. 2. No peritoneal or omental metastasis identified.  No free fluid. 3. Postcholecystectomy and hysterectomy. Comparison exams are made available. Comparison CT 09/08/2018 and 05/27/2018. PET-CT 05/27/2018    There is a nodule within the proximal aspect of the sigmoid colon measuring 2.2 by 2.2 cm. In comparison to prior CTs and PET-CT there was a hypermetabolic nodule at this location on the PET-CT of 08/27/2017 and on the CT of 09/08/2018 there was a small residual nodule. At that time (09/08/2018) the nodule measured 1.4 by 1.3 cm. Therefore this nodule has increased in the interval and concerning for recurrence of a serosal implant. The previous described lymph nodes in the sigmoid mesocolon and more central  mesentery are not increased in size and not pathologic by size criteria.    Concern for recurrence of serosal metastasis in the proximal sigmoid colon with a 2 cm enlarging lesion. Lesion extends into the lumen. No evidence of high-grade obstruction. Consider FDG PET scan and/or potential colonoscopy for evaluation.   06/29/2019 PET scan   1. Enlarging serosal implant within the proximal sigmoid colon has intense metabolic activity most consistent with malignancy. Lesion exhibits a somewhat indolent progression as present on PET-CT scan from 10/11/2017 and 05/27/2018. 2. Focal hypermetabolic activity within the RIGHT rectus muscle just off midline is slightly decreased from comparison exam. This may be benign inflammation related to prior laparotomy, however malignancy not excluded.   07/26/2019 Procedure   She had colonoscopy which showed multiple polyps.  6 polyps were removed from the cecum, measuring 3 to 6 mm in size.  One 12 mm polyp was removed from ascending colon and another 1 measures 7 mm.  One 5 mm polyp was removed from the transverse colon.  There is tumor noted in the sigmoid colon, approximately 35 cm from the anus which was biopsied.  The tumor appeared to be fungating, infiltrative and ulcerated but nonobstructive.  It encompassed approximately one third of the circumference of the lumen.   07/26/2019 Pathology Results   Multiple polyps came back tubular adenomas.  2 ascending polyps came back sessile serrated adenoma months.  Sigmoid colon biopsy confirmed metastatic high-grade serous carcinoma.  The morphology and immunophenotype are consistent with metastatic high-grade serous carcinoma from tubal/ovarian primary site.   08/17/2019 - 12/12/2019 Chemotherapy   The patient had carboplatin  and taxol   10/23/2019 Imaging   1. Sigmoid lesion with diminished size/conspicuity difficult to measure on the previous exam. Adjacent lymph nodes and nodularity less than a cm, largest on coronal  image 48 measuring 5 mm. 2. Right rectus muscle with some thickening with mildly convex margin seen posteriorly on image 72 of series 2. This could be due to postsurgical change, however, metastatic disease is not excluded. Attention on follow-up is suggested. 3. No evidence for new metastatic disease. 4. Small hiatal hernia. 5. 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   Aortic Atherosclerosis (ICD10-I70.0).   01/01/2020 Imaging   1. No signs of new disease. 2. Tiny lymph nodes in the area of concern in the LEFT lower quadrant z. 3. Added density and subtle contour irregularity involving the rectus muscles best seen on sagittal images, associated with site of prior surgical incision just to the RIGHT of midline (image 72, series 2 and image 58, series 5. Not significantly changed compared to prior studies. This measures approximately 2.1 x 1 cm in the sagittal plane and is less well-defined in the axial plane. Potentially postoperative change. Attention on follow-up. 4. Aortic atherosclerosis.   Aortic Atherosclerosis (ICD10-I70.0).       01/29/2020 - 04/07/2022 Chemotherapy   Patient is on Treatment Plan : Ovarian Bevacizumab      05/06/2020 Imaging   1. Status post hysterectomy and oophorectomy. 2. No specific findings of recurrent or metastatic disease in the abdomen or pelvis. 3. No change in postoperative appearance of low midline incision. 4. Unchanged small prominent subcentimeter left iliac lymph nodes.   10/22/2020 Imaging   1. Status post hysterectomy and oophorectomy. No evidence of recurrent or metastatic disease within the abdomen or pelvis. 2. No significant change in the subcentimeter prominent left iliac lymph nodes.   04/11/2021 Imaging   1. No findings to suggest recurrent or metastatic disease in the abdomen or pelvis. 2. Aortic atherosclerosis, in addition to at least left anterior descending coronary artery disease. Assessment for potential risk factor modification,  dietary therapy or pharmacologic therapy may be warranted, if clinically indicated. 3. Additional incidental findings, as above.   10/15/2021 Imaging   Stable exam. No evidence of recurrent or metastatic carcinoma within the abdomen or pelvis.   Stable tiny benign right renal angiomyolipoma.   Aortic Atherosclerosis (ICD10-I70.0).     04/20/2022 Imaging   1. Stable exam. No new or progressive interval findings to suggest recurrent or metastatic disease. 2. Stable tiny benign right adrenal angiomyolipoma. 3.  Aortic Atherosclerois (ICD10-170.0)   11/10/2022 Procedure   Successful removal of an implanted RIGHT chest Port-A-Cath    10/11/2023 Imaging   CT ABDOMEN PELVIS W CONTRAST Result Date: 10/17/2023 CLINICAL DATA:  Follow-up ovarian/fallopian tube carcinoma. * Tracking Code: BO * EXAM: CT ABDOMEN AND PELVIS WITH CONTRAST TECHNIQUE: Multidetector CT imaging of the abdomen and pelvis was performed using the standard protocol following bolus administration of intravenous contrast. RADIATION DOSE REDUCTION: This exam was performed according to the departmental dose-optimization program which includes automated exposure control, adjustment of the mA and/or kV according to patient size and/or use of iterative reconstruction technique. CONTRAST:  OMNIPAQUE  IOHEXOL  300 MG/ML  SOLN COMPARISON:  10/15/2022 FINDINGS: Lower Chest: No acute findings. Hepatobiliary: Stable small low-attenuation lesion in the left lobe adjacent to the falciform ligament, consistent with benign etiology. No new or enlarging liver lesions identified. Prior cholecystectomy. No evidence of biliary obstruction. Pancreas:  No mass or inflammatory changes. Spleen: Within normal limits in size and appearance. Adrenals/Urinary Tract:  Stable 1 cm fat attenuation mass in right kidney, consistent with benign angiomyolipoma (No followup imaging is recommended). No evidence of ureteral calculi or hydronephrosis. Unremarkable unopacified  urinary bladder. Stomach/Bowel: No evidence of obstruction, inflammatory process or abnormal fluid collections. Vascular/Lymphatic: No pathologically enlarged lymph nodes. No acute vascular findings. Reproductive: Prior hysterectomy noted. Adnexal regions are unremarkable in appearance. No evidence of pelvic mass, peritoneal thickening, or ascites. Other:  None. Musculoskeletal:  No suspicious bone lesions identified. IMPRESSION: Stable exam.  No evidence of recurrent or metastatic disease. Electronically Signed   By: Marlyce Sine M.D.   On: 10/17/2023 15:08        Interval History: Follows with neurosurgeon for shots for back pain every 3 months. Has her next one next week. Sees neurosurgeon next in July. Lost a lot of weight when she had worsened leg pain that put her out, but is now stabilizing weight. Otherwise has chronic diarrhea at baseline. She otherwise denies new vaginal bleeding, pelvic pain, change of bladder habits, back to baseline eating/drinking.   Past Medical/Surgical History: Past Medical History:  Diagnosis Date   Burning mouth syndrome    Chronic fatigue    Constipation    Diabetes (HCC) 05/09/2018   Diarrhea    in the past after gallbladder removal   Fibromyalgia    GERD (gastroesophageal reflux disease)    History of kidney stones 07/2015   Hyperlipidemia    Hypertension    borderline not on meds    Insomnia secondary to depression with anxiety    Lumbar disc disease    Ovarian cancer (HCC) 2014/2020   met nodule in abd   Pelvic mass in female    Pneumonia    hx of pneumonia as an infant   PONV (postoperative nausea and vomiting)    pain from gas hernia 2017   Shortness of breath    with exertion    Sleep apnea    uses CPAP   Yeast infection     Past Surgical History:  Procedure Laterality Date   ABDOMINAL HYSTERECTOMY     early 35s   APPENDECTOMY     WITH DEBULKING/BSO   CHOLECYSTECTOMY     early 33s   CYSTOSCOPY W/ URETERAL STENT PLACEMENT  Left 07/09/2015   DUE TO NEPHROLITHIASIS Procedure: CYSTOSCOPY WITH LEFT RETROGRADE PYELOGRAM/ LEFT URETERAL STENT PLACEMENT;  Surgeon: Andrez Banker, MD;  Location: [facility];  Service: Urology;  Laterality: Left;   HERNIA REPAIR     INCISIONAL HERNIA REPAIR N/A 12/25/2015   WITH MESH Procedure:  INCISIONAL HERNIA REPAIR;  Surgeon: Shela Derby, MD;  Location: [facility];  Service: General;  Laterality: N/A;   INCISIONAL HERNIA REPAIR N/A 10/21/2018   Procedure: LAPAROSCOPIC INCISIONAL HERNIA REPAIR WITH MESH;  Surgeon: Shela Derby, MD;  Location: [facility];  Service: General;  Laterality: N/A;   INSERTION OF MESH N/A 12/25/2015   Procedure: INSERTION OF MESH;  Surgeon: Shela Derby, MD;  Location: [facility];  Service: General;  Laterality: N/A;   IR REMOVAL TUN ACCESS W/ PORT W/O FL MOD SED  11/10/2022   LAPAROTOMY Bilateral 11/08/2012   Procedure: EXPLORATORY LAPAROTOMY BILATERAL SALPINGO OOPHORECTOMY TUMOR DEBULKING ;  Surgeon: Daryel Ensign A. Clerance Dais, MD;  Location: [facility];  Service: Gynecology;  Laterality: Bilateral;  APPENDECTOMY / OMENTECTOMY   LAPAROTOMY N/A 12/25/2015   Procedure: EXPLORATORY LAPAROTOMY;  Surgeon: Shela Derby, MD;  Location: [facility];  Service: General;  Laterality: N/A;   LYSIS OF ADHESION N/A 12/25/2015   Procedure:  LYSIS OF ADHESION;  Surgeon: Shela Derby, MD;  Location: [facility];  Service: General;  Laterality: N/A;   PARTIAL HYSTERECTOMY  02-11-2005   TRIGGER FINGER RELEASE      Family History  Problem Relation Age of Onset   Lung cancer Mother 38   High blood pressure Mother    High Cholesterol Mother    Lung cancer Father 48       2 ppd smoker   Parkinson's disease Father    Kidney disease Other     Social History   Socioeconomic History   Marital status: Widowed    Spouse name: Garrett Kallman   Number of children: 2   Years of education: Master's   Highest education level: Not on file  Occupational History   Not on file  Tobacco Use   Smoking status: Never    Smokeless tobacco: Never  Vaping Use   Vaping status: Never Used  Substance and Sexual Activity   Alcohol use: No   Drug use: No   Sexual activity: Not on file  Other Topics Concern   Not on file  Social History Narrative   Patient is married Garrett Kallman). Husband passed away in 2015-02-12   Patient has 2 children by birth and 13 children all together.   Patient has a Scientist, water quality.   Patient is right-handed.   Patient drinks very little caffeine.         Social Drivers of Corporate investment banker Strain: Not on file  Food Insecurity: No Food Insecurity (01/21/2024)   Hunger Vital Sign    Worried About Running Out of Food in the Last Year: Never true    Ran Out of Food in the Last Year: Never true  Transportation Needs: No Transportation Needs (01/21/2024)   PRAPARE - Administrator, Civil Service (Medical): No    Lack of Transportation (Non-Medical): No  Physical Activity: Not on file  Stress: Not on file  Social Connections: Not on file    Current Medications:  Current Outpatient Medications:    acetaminophen  (TYLENOL ) 500 MG tablet, Take 500 mg by mouth every 6 (six) hours as needed for mild pain., Disp: , Rfl:    amLODipine  (NORVASC ) 10 MG tablet, Take 1 tablet (10 mg total) by mouth daily., Disp: 30 tablet, Rfl: 11   busPIRone  (BUSPAR ) 15 MG tablet, Take 15 mg by mouth 2 (two) times daily., Disp: , Rfl:    calcium  carbonate (OSCAL) 1500 (600 Ca) MG TABS tablet, Take by mouth daily with breakfast., Disp: , Rfl:    chlorthalidone  (HYGROTON ) 25 MG tablet, Take 25 mg by mouth daily., Disp: , Rfl:    cholecalciferol (VITAMIN D3) 25 MCG (1000 UT) tablet, Take 1,000 Units by mouth daily., Disp: , Rfl:    colestipol  (COLESTID ) 1 g tablet, Take 1 g by mouth at bedtime. Take 1 tablet in the evening, 1/2 tablet in the morning, Disp: , Rfl:    Eszopiclone  3 MG TABS, TAKE 1 TABLET BY MOUTH EVERYDAY AT BEDTIME, Disp: 30 tablet, Rfl: 5   ezetimibe (ZETIA) 10 MG tablet, Take 10 mg by  mouth every morning., Disp: , Rfl:    FIBER PO, Take by mouth., Disp: , Rfl:    FLUoxetine  (PROZAC ) 20 MG capsule, Take 40 mg by mouth every morning., Disp: , Rfl:    loperamide (IMODIUM) 2 MG capsule, Take by mouth as needed for diarrhea or loose stools., Disp: , Rfl:    Melatonin 10 MG TABS, Take 10  mg by mouth at bedtime., Disp: , Rfl:    metFORMIN  (GLUCOPHAGE -XR) 500 MG 24 hr tablet, Take 500 mg by mouth daily with breakfast. , Disp: , Rfl: 3   metoprolol  tartrate (LOPRESSOR ) 25 MG tablet, Take 1 tablet (25 mg total) by mouth 2 (two) times daily., Disp: 60 tablet, Rfl: 3   modafinil  (PROVIGIL ) 200 MG tablet, Take 1 tablet (200 mg total) by mouth daily., Disp: 90 tablet, Rfl: 3   Multiple Vitamin (MULTIVITAMIN WITH MINERALS) TABS, Take 1 tablet by mouth every morning. Womens 50+, Disp: , Rfl:    rosuvastatin  (CRESTOR ) 40 MG tablet, , Disp: , Rfl:    telmisartan  (MICARDIS ) 40 MG tablet, Take 40 mg by mouth daily., Disp: , Rfl:   Review of Symptoms: Complete 10-system review is positive for: back pain  Physical Exam: BP 108/61 (BP Location: Left Arm, Patient Position: Sitting)   Pulse 68   Temp 97.6 F (36.4 C) (Oral)   Resp 16   Ht 4' 11.75 (1.518 m)   Wt 161 lb 6.4 oz (73.2 kg)   SpO2 100%   BMI 31.79 kg/m  General: Alert, oriented, no acute distress. HEENT: Normocephalic, atraumatic. Neck symmetric without masses. Sclera anicteric.  Chest: Normal work of breathing. Clear to auscultation bilaterally.   Cardiovascular: Regular rate and rhythm, no murmurs. Abdomen: Soft, nontender.  Normoactive bowel sounds.  No masses appreciated.  Well-healed incisions. Extremities: Grossly normal range of motion.  Warm, well perfused.  No edema bilaterally. Skin: No rashes or lesions noted. Lymphatics: No cervical, supraclavicular, or inguinal adenopathy. GU: Normal appearing external genitalia without erythema, excoriation, or lesions. Speculum exam reveals atrophic vaginal mucosa.  Bimanual  exam reveals smooth vaginal cuff. No nodularity or pelvic mass. Exam chaperoned by Kimberly [place], CMA   Laboratory & Radiologic Studies: CT ABDOMEN PELVIS W CONTRAST 10/11/2023  Narrative CLINICAL DATA:  Follow-up ovarian/fallopian tube carcinoma. * Tracking Code: BO *  EXAM: CT ABDOMEN AND PELVIS WITH CONTRAST  TECHNIQUE: Multidetector CT imaging of the abdomen and pelvis was performed using the standard protocol following bolus administration of intravenous contrast.  RADIATION DOSE REDUCTION: This exam was performed according to the departmental dose-optimization program which includes automated exposure control, adjustment of the mA and/or kV according to patient size and/or use of iterative reconstruction technique.  CONTRAST:  OMNIPAQUE  IOHEXOL  300 MG/ML  SOLN  COMPARISON:  10/15/2022  FINDINGS: Lower Chest: No acute findings.  Hepatobiliary: Stable small low-attenuation lesion in the left lobe adjacent to the falciform ligament, consistent with benign etiology. No new or enlarging liver lesions identified. Prior cholecystectomy. No evidence of biliary obstruction.  Pancreas:  No mass or inflammatory changes.  Spleen: Within normal limits in size and appearance.  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 No evidence of ureteral calculi or hydronephrosis. Unremarkable unopacified urinary bladder.  Stomach/Bowel: No evidence of obstruction, inflammatory process or abnormal fluid collections.  Vascular/Lymphatic: No pathologically enlarged lymph nodes. No acute vascular findings.  Reproductive: Prior hysterectomy noted. Adnexal regions are unremarkable in appearance. No evidence of pelvic mass, peritoneal thickening, or ascites.  Other:  None.  Musculoskeletal:  No suspicious bone lesions identified.  IMPRESSION: Stable exam.  No evidence of  recurrent or metastatic disease.   Electronically Signed By: Marlyce Sine M.D. On: 10/17/2023 15:08

## 2024-01-24 NOTE — Patient Instructions (Signed)
 It was a pleasure to see you in clinic today. - Normal exam - Return visit planned for 3 mo  Thank you very much for allowing me to provide care for you today.  I appreciate your confidence in choosing our Gynecologic Oncology team at [facility].  If you have any questions about your visit today please call our office or send us  a MyChart message and we will get back to you as soon as possible.

## 2024-02-02 DIAGNOSIS — M5416 Radiculopathy, lumbar region: Secondary | ICD-10-CM | POA: Diagnosis not present

## 2024-02-03 ENCOUNTER — Telehealth: Payer: Self-pay | Admitting: Neurology

## 2024-02-03 NOTE — Telephone Encounter (Signed)
 Patient refill request for Eszopiclone  3 MG [facility]S [facility] [phone]

## 2024-02-03 NOTE — Telephone Encounter (Signed)
 Spoke w/Pt regarding refill request for eszopiclone . Informed Pt script was written 11/22/23 with 5 refills so she should have refills left. [facility] without a script. Pt verified date last filled on bottle and showing refills left. Pt stated [facility] will [facility]. Informed [facility] if [facility] has questions they can call our office. Pt voiced thanks for the call back.

## 2024-02-07 DIAGNOSIS — I2510 Atherosclerotic heart disease of native coronary artery without angina pectoris: Secondary | ICD-10-CM | POA: Diagnosis not present

## 2024-02-07 DIAGNOSIS — E1121 Type 2 diabetes mellitus with diabetic nephropathy: Secondary | ICD-10-CM | POA: Diagnosis not present

## 2024-02-07 DIAGNOSIS — I10 Essential (primary) hypertension: Secondary | ICD-10-CM | POA: Diagnosis not present

## 2024-02-07 DIAGNOSIS — E1159 Type 2 diabetes mellitus with other circulatory complications: Secondary | ICD-10-CM | POA: Diagnosis not present

## 2024-02-08 DIAGNOSIS — Z6831 Body mass index (BMI) 31.0-31.9, adult: Secondary | ICD-10-CM | POA: Diagnosis not present

## 2024-02-08 DIAGNOSIS — M79605 Pain in left leg: Secondary | ICD-10-CM | POA: Diagnosis not present

## 2024-02-08 DIAGNOSIS — M545 Low back pain, unspecified: Secondary | ICD-10-CM | POA: Diagnosis not present

## 2024-02-08 DIAGNOSIS — I959 Hypotension, unspecified: Secondary | ICD-10-CM | POA: Diagnosis not present

## 2024-02-29 DIAGNOSIS — M25552 Pain in left hip: Secondary | ICD-10-CM | POA: Diagnosis not present

## 2024-02-29 DIAGNOSIS — M5416 Radiculopathy, lumbar region: Secondary | ICD-10-CM | POA: Diagnosis not present

## 2024-03-01 ENCOUNTER — Other Ambulatory Visit (HOSPITAL_COMMUNITY): Payer: Self-pay | Admitting: Neurosurgery

## 2024-03-01 DIAGNOSIS — M25552 Pain in left hip: Secondary | ICD-10-CM

## 2024-03-02 ENCOUNTER — Ambulatory Visit (HOSPITAL_COMMUNITY)
Admission: RE | Admit: 2024-03-02 | Discharge: 2024-03-02 | Disposition: A | Discharge status: Home / Self Care | Source: Ambulatory Visit | Attending: Neurosurgery | Admitting: Neurosurgery

## 2024-03-02 ENCOUNTER — Encounter (HOSPITAL_COMMUNITY): Payer: Self-pay

## 2024-03-02 DIAGNOSIS — M25552 Pain in left hip: Secondary | ICD-10-CM

## 2024-03-04 ENCOUNTER — Encounter (HOSPITAL_COMMUNITY): Payer: Self-pay

## 2024-03-04 ENCOUNTER — Ambulatory Visit (HOSPITAL_COMMUNITY)
Admission: RE | Admit: 2024-03-04 | Discharge: 2024-03-04 | Disposition: A | Discharge status: Home / Self Care | Source: Ambulatory Visit | Attending: Neurosurgery | Admitting: Neurosurgery

## 2024-03-04 DIAGNOSIS — M25552 Pain in left hip: Secondary | ICD-10-CM | POA: Diagnosis not present

## 2024-03-04 DIAGNOSIS — M25452 Effusion, left hip: Secondary | ICD-10-CM | POA: Diagnosis not present

## 2024-03-04 DIAGNOSIS — M160 Bilateral primary osteoarthritis of hip: Secondary | ICD-10-CM | POA: Diagnosis not present

## 2024-03-09 DIAGNOSIS — I2510 Atherosclerotic heart disease of native coronary artery without angina pectoris: Secondary | ICD-10-CM | POA: Diagnosis not present

## 2024-03-09 DIAGNOSIS — E1159 Type 2 diabetes mellitus with other circulatory complications: Secondary | ICD-10-CM | POA: Diagnosis not present

## 2024-03-09 DIAGNOSIS — E1121 Type 2 diabetes mellitus with diabetic nephropathy: Secondary | ICD-10-CM | POA: Diagnosis not present

## 2024-03-09 DIAGNOSIS — I10 Essential (primary) hypertension: Secondary | ICD-10-CM | POA: Diagnosis not present

## 2024-03-16 DIAGNOSIS — Z6831 Body mass index (BMI) 31.0-31.9, adult: Secondary | ICD-10-CM | POA: Diagnosis not present

## 2024-03-16 DIAGNOSIS — M25552 Pain in left hip: Secondary | ICD-10-CM | POA: Diagnosis not present

## 2024-04-04 ENCOUNTER — Ambulatory Visit (INDEPENDENT_AMBULATORY_CARE_PROVIDER_SITE_OTHER): Admitting: Sports Medicine

## 2024-04-04 ENCOUNTER — Encounter: Payer: Self-pay | Admitting: Sports Medicine

## 2024-04-04 ENCOUNTER — Ambulatory Visit (INDEPENDENT_AMBULATORY_CARE_PROVIDER_SITE_OTHER): Admitting: Orthopaedic Surgery

## 2024-04-04 ENCOUNTER — Other Ambulatory Visit: Payer: Self-pay

## 2024-04-04 DIAGNOSIS — M5416 Radiculopathy, lumbar region: Secondary | ICD-10-CM | POA: Insufficient documentation

## 2024-04-04 DIAGNOSIS — M25552 Pain in left hip: Secondary | ICD-10-CM

## 2024-04-04 DIAGNOSIS — M160 Bilateral primary osteoarthritis of hip: Secondary | ICD-10-CM

## 2024-04-04 MED ORDER — METHYLPREDNISOLONE ACETATE 40 MG/ML IJ SUSP
40.0000 mg | INTRAMUSCULAR | Status: AC | PRN
Start: 2024-04-04 — End: 2024-04-04
  Administered 2024-04-04: 40 mg via INTRA_ARTICULAR

## 2024-04-04 MED ORDER — LIDOCAINE HCL 1 % IJ SOLN
4.0000 mL | INTRAMUSCULAR | Status: AC | PRN
Start: 2024-04-04 — End: 2024-04-04
  Administered 2024-04-04: 4 mL

## 2024-04-04 NOTE — Progress Notes (Signed)
 Office Visit Note   Patient: Kristin Webb           Date of Birth: 12/02/1945           MRN: [id number] Visit Date: 04/04/2024              Requested by: Gillie Duncans, MD [address] [place],  [place] [address] PCP: Aisha Harvey, MD   Assessment & Plan: Visit Diagnoses:  1. Pain in left hip   2. Lumbar radiculopathy, chronic     Plan: History of Present Illness Kristin Webb is a 78 year old female who presents with severe leg pain and mobility issues.  She experiences severe pain in her lower and upper leg, which has progressively worsened, leading to increased reliance on mobility aids from a cane to a walker, and now a wheelchair. Post-spinal surgery in November, her pain worsened despite a normal follow-up MRI. A hip injection provided no relief, while a spinal injection offered temporary alleviation. Pain intensified two weeks before the next scheduled spinal injection.  She has lost forty pounds due to pain-related inability to eat. She cannot stand, walk, or sit for extended periods without exacerbating her symptoms. Extending her leg offers some relief but does not eliminate the pain.  She experiences 'pins and needles' sensations in her leg. There is no hip pain, and over-the-counter medications are ineffective. She was advised to avoid them except for Tylenol , which helps her lower back pain but not her leg pain. She recalls a previous adverse reaction to a cortisone injection in her hand, causing swelling and intense pain.  Physical Exam MUSCULOSKELETAL: Hip flexion and rotation do not elicit pain. No pain on palpation of hip bursa.  She reports numbness and tingling pins and needle sensation in the thigh and in the lower leg.  Assessment and Plan Lower extremity pain, likely lumbar radiculopathy Chronic severe leg pain with neurological symptoms suggests lumbar radiculopathy. Previous spinal injection relief supports lumbar origin. Hip joint injection  deemed unnecessary based on clinical presentation. Pain management remains challenging. - Order hip joint injection if necessary to rule out hip involvement but I think that this is of the low yield - I recommend that she go back to neurosurgery for further evaluation and treatment  Follow-Up Instructions: No follow-ups on file.   Orders:  No orders of the defined types were placed in this encounter.  No orders of the defined types were placed in this encounter.     Procedures: No procedures performed   Clinical Data: No additional findings.   Subjective: Chief Complaint  Patient presents with   Left Hip - Pain    HPI  Review of Systems  Constitutional: Negative.   HENT: Negative.    Eyes: Negative.   Respiratory: Negative.    Cardiovascular: Negative.   Endocrine: Negative.   Musculoskeletal: Negative.   Neurological: Negative.   Hematological: Negative.   Psychiatric/Behavioral: Negative.    All other systems reviewed and are negative.    Objective: Vital Signs: There were no vitals taken for this visit.  Physical Exam Vitals and nursing note reviewed.  Constitutional:      Appearance: She is well-developed.  HENT:     Head: Atraumatic.     Nose: Nose normal.  Eyes:     Extraocular Movements: Extraocular movements intact.  Cardiovascular:     Pulses: Normal pulses.  Pulmonary:     Effort: Pulmonary effort is normal.  Abdominal:     Palpations:  Abdomen is soft.  Musculoskeletal:     Cervical back: Neck supple.  Skin:    General: Skin is warm.     Capillary Refill: Capillary refill takes less than 2 seconds.  Neurological:     Mental Status: She is alert. Mental status is at baseline.  Psychiatric:        Behavior: Behavior normal.        Thought Content: Thought content normal.        Judgment: Judgment normal.     Ortho Exam  Specialty Comments:  No specialty comments available.  Imaging: No results found.   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/2021   Chemotherapy-induced neutropenia (HCC) 12/23/2021   Morbid obesity (HCC) 12/23/2021   Dry cough 09/26/2021   Coronary arteriosclerosis 04/11/2021   Bilateral edema of lower extremity 01/16/2021   Chronic back pain greater than 3 months duration 05/29/2020   Frequent headaches 03/13/2020   Balance problems 01/29/2020   Sinus congestion 01/29/2020   Bilateral cataracts 12/12/2019   UTI (urinary tract infection) 11/22/2019   Dysuria 11/20/2019   Peripheral neuropathy due to chemotherapy (HCC) 10/02/2019   Thrombocytopenia (HCC) 09/08/2019   Fibromyalgia 09/08/2019   Goals of care, counseling/discussion 08/08/2019   Excessive body weight gain 05/09/2018   Neoplastic malignant related fatigue 05/09/2018   OSA on CPAP 05/09/2018   Newly diagnosed diabetes (HCC) 05/09/2018   Hypercalcemia 07/21/2016   Other fatigue 07/15/2016   Excessive daytime sleepiness 05/18/2016   Abdominal wall hernia 05/18/2016   Activity intolerance related to fatigue 05/18/2016   S/P hernia repair 12/25/2015   BRCA negative 09/25/2015   Pyelonephritis 07/09/2015   Nephrolithiasis 07/09/2015   Insomnia secondary to depression with anxiety 12/24/2014   Gastritis 12/20/2014   Port-A-Cath in place 12/20/2014   Insomnia 12/12/2013   Obesity (BMI 35.0-39.9 without comorbidity) 12/12/2013   OSA (obstructive sleep apnea) 11/24/2013   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   Ventral hernia 04/18/2013   Essential hypertension 04/18/2013   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   Past Medical History:  Diagnosis Date   Burning mouth syndrome    Chronic fatigue    Constipation    Diabetes (HCC) 05/09/2018   Diarrhea    in the past after gallbladder removal   Fibromyalgia    GERD (gastroesophageal reflux disease)    History of kidney stones 07/2015   Hyperlipidemia    Hypertension    borderline not on meds    Insomnia secondary to depression with anxiety    Lumbar disc disease    Ovarian cancer (HCC) 2014/2020   met nodule in abd   Pelvic mass in female    Pneumonia    hx of pneumonia as an infant   PONV (postoperative nausea and vomiting)    pain from gas hernia 2017   Shortness of breath    with exertion    Sleep apnea    uses CPAP   Yeast infection     Family History  Problem Relation Age of Onset   Lung cancer Mother 25   High blood pressure Mother    High Cholesterol Mother    Lung cancer Father 2       2 ppd smoker   Parkinson's disease Father    Kidney disease Other     Past Surgical History:  Procedure Laterality Date   ABDOMINAL HYSTERECTOMY     early 13s   APPENDECTOMY     WITH DEBULKING/BSO   CHOLECYSTECTOMY     early 8s   CYSTOSCOPY W/ URETERAL STENT PLACEMENT Left 07/09/2015   DUE TO NEPHROLITHIASIS Procedure: CYSTOSCOPY WITH LEFT RETROGRADE PYELOGRAM/ LEFT URETERAL STENT PLACEMENT;  Surgeon: Morene LELON Salines, MD;  Location: [facility];  Service: Urology;  Laterality: Left;   HERNIA REPAIR     INCISIONAL HERNIA REPAIR N/A 12/25/2015   WITH MESH Procedure:  INCISIONAL HERNIA REPAIR;  Surgeon: Lynda Leos, MD;  Location: [facility];  Service: General;  Laterality: N/A;   INCISIONAL HERNIA REPAIR N/A 10/21/2018   Procedure: LAPAROSCOPIC INCISIONAL HERNIA REPAIR WITH MESH;  Surgeon: Leos Lynda, MD;  Location: [facility] OR;  Service: General;  Laterality: N/A;   INSERTION OF MESH N/A  12/25/2015   Procedure: INSERTION OF MESH;  Surgeon: Lynda Leos, MD;  Location: [facility];  Service: General;  Laterality: N/A;   IR REMOVAL TUN ACCESS W/ PORT W/O FL MOD SED  11/10/2022   LAPAROTOMY Bilateral 11/08/2012   Procedure: EXPLORATORY LAPAROTOMY BILATERAL SALPINGO OOPHORECTOMY TUMOR DEBULKING ;  Surgeon: Elenore A. Dodie, MD;  Location: [facility];  Service: Gynecology;  Laterality: Bilateral;  APPENDECTOMY / OMENTECTOMY   LAPAROTOMY N/A 12/25/2015   Procedure: EXPLORATORY LAPAROTOMY;  Surgeon: Lynda Leos, MD;  Location: [facility];  Service: General;  Laterality: N/A;   LYSIS OF ADHESION N/A 12/25/2015   Procedure: LYSIS OF ADHESION;  Surgeon: Lynda Leos, MD;  Location: [facility];  Service: General;  Laterality: N/A;   PARTIAL HYSTERECTOMY  2006   TRIGGER FINGER RELEASE     Social History   Occupational History   Not on file  Tobacco Use   Smoking status: Never   Smokeless tobacco: Never  Vaping Use   Vaping status: Never Used  Substance and Sexual Activity   Alcohol use: No   Drug use: No   Sexual activity: Not on file

## 2024-04-04 NOTE — Progress Notes (Signed)
   Procedure Note  Patient: Kristin Webb             Date of Birth: 03-17-1946           MRN: [id number]             Visit Date: 04/04/2024  Procedures: Visit Diagnoses:  1. Pain in left hip   2. Bilateral primary osteoarthritis of hip    Large Joint Inj: L hip joint on 04/04/2024 10:44 AM Indications: pain Details: 22 G 3.5 in needle, ultrasound-guided anterior approach Medications: 4 mL lidocaine  1 %; 40 mg methylPREDNISolone  acetate 40 MG/ML Outcome: tolerated well, no immediate complications  Procedure: US -guided intra-articular hip injection, Left After discussion on risks/benefits/indications and informed verbal consent was obtained, a timeout was performed. Patient was lying supine on exam table. The hip was cleaned with betadine and alcohol swabs. Then utilizing ultrasound guidance, the patient's femoral head and neck junction was identified and subsequently injected with 4:1 lidocaine :depomedrol via an in-plane approach with ultrasound visualization of the injectate administered into the hip joint. Patient tolerated procedure well without immediate complications.  *Procedure performed with Dr. Lynwood Procedure, treatment alternatives, risks and benefits explained, specific risks discussed. Consent was given by the patient. Immediately prior to procedure a time out was called to verify the correct patient, procedure, equipment, support staff and site/side marked as required. Patient was prepped and draped in the usual sterile fashion.     - patient tolerated procedure well, discussed post-injection protocol - follow-up with Dr. Jerri as indicated; I am happy to see her as needed  Lonell Sprang, DO Primary Care Sports Medicine Physician  [facility] - Orthopedics  This note was dictated using Dragon naturally speaking software and may contain errors in syntax, spelling, or content which have not been identified prior to signing this note.

## 2024-04-06 ENCOUNTER — Other Ambulatory Visit: Payer: Self-pay | Admitting: Neurology

## 2024-04-06 MED ORDER — ESZOPICLONE 3 MG PO TABS: ORAL_TABLET | ORAL | 5 refills | Status: DC

## 2024-04-06 NOTE — Telephone Encounter (Signed)
**   CORRECTION **  Called Pt  Informed that there is a refill that was placed for  PT  ON 8-26 -25  [facility]  [address] [facility] Also informed  that Pt [facility] for next [facility] [address]. Pt understood and will pick up medication  today .

## 2024-04-06 NOTE — Telephone Encounter (Signed)
 I have sent a refill request for MD to send in for the pt. I have changed the start date to be 05/04/2024 as that is when the pt will be due. She [facility] [address] instructed pharmacy to place on file for the pt.

## 2024-04-06 NOTE — Telephone Encounter (Signed)
 Pt called stating that her pharmacy informed her that she would have to call the office so that an updated Rx can be sent in for her  Eszopiclone  3 MG TABS Pt states that she should have a refill left but they told her they need a new Rx. [facility] [address]   Pt stated that after this refill she would like to start going to [facility] [address]

## 2024-04-09 DIAGNOSIS — I2510 Atherosclerotic heart disease of native coronary artery without angina pectoris: Secondary | ICD-10-CM | POA: Diagnosis not present

## 2024-04-09 DIAGNOSIS — I10 Essential (primary) hypertension: Secondary | ICD-10-CM | POA: Diagnosis not present

## 2024-04-09 DIAGNOSIS — E1159 Type 2 diabetes mellitus with other circulatory complications: Secondary | ICD-10-CM | POA: Diagnosis not present

## 2024-04-09 DIAGNOSIS — E1121 Type 2 diabetes mellitus with diabetic nephropathy: Secondary | ICD-10-CM | POA: Diagnosis not present

## 2024-04-18 ENCOUNTER — Other Ambulatory Visit: Payer: Self-pay | Admitting: Neurology

## 2024-04-20 DIAGNOSIS — M5430 Sciatica, unspecified side: Secondary | ICD-10-CM | POA: Diagnosis not present

## 2024-04-24 ENCOUNTER — Inpatient Hospital Stay: Attending: Psychiatry | Admitting: Psychiatry

## 2024-04-24 ENCOUNTER — Encounter: Payer: Self-pay | Admitting: Psychiatry

## 2024-04-24 VITALS — BP 120/66 | HR 74 | Temp 98.1°F | Resp 19 | Wt 149.5 lb

## 2024-04-24 DIAGNOSIS — Z9079 Acquired absence of other genital organ(s): Secondary | ICD-10-CM | POA: Insufficient documentation

## 2024-04-24 DIAGNOSIS — Z90710 Acquired absence of both cervix and uterus: Secondary | ICD-10-CM | POA: Diagnosis not present

## 2024-04-24 DIAGNOSIS — Z90722 Acquired absence of ovaries, bilateral: Secondary | ICD-10-CM | POA: Diagnosis not present

## 2024-04-24 DIAGNOSIS — Z8544 Personal history of malignant neoplasm of other female genital organs: Secondary | ICD-10-CM | POA: Diagnosis not present

## 2024-04-24 DIAGNOSIS — Z9221 Personal history of antineoplastic chemotherapy: Secondary | ICD-10-CM | POA: Insufficient documentation

## 2024-04-24 DIAGNOSIS — C5700 Malignant neoplasm of unspecified fallopian tube: Secondary | ICD-10-CM

## 2024-04-24 DIAGNOSIS — S31000A Unspecified open wound of lower back and pelvis without penetration into retroperitoneum, initial encounter: Secondary | ICD-10-CM | POA: Diagnosis not present

## 2024-04-24 NOTE — Progress Notes (Signed)
 Gynecologic Oncology Return Clinic Visit  Date of Service: 04/24/2024 Referring Provider: Almarie Bedford, MD  Assessment & Plan: Kristin Webb is a 78 y.o. woman with recurrent platinum sensitive fallopian tube carcinoma, recurrence treated with carboplatin /paclitaxel  (completed 12/2019) followed by maintenance bevacizumab  through 04/2022. She presents today for surveillance.   Fallopian tube carcinoma: - NED on exam today. - Last CT a/p on 10/11/23 NED - Signs/symptoms of recurrence reviewed. - Dr. Bedford plans annual imaging and follow-up (10/31/24) - CA125 does not appear to be a sensitive marker for her. - [facility] Breast/Ovary Panel negative (including BRCA, MMR's, RAD51 etc), [facility] BRACAnalysis  Negative for BRCA1/2 in tumor  - Given now 106yrs NED since discontinuation of Bev, will space out surveillance to q1mo.   Sacral wound: - Recommend trial of desitin barrier cream - Follow-up with PCP if no improvement  RTC 6 months  Hoy Masters, MD Gynecologic Oncology   Medical Decision Making I personally spent TOTAL 20 minutes face-to-face and non-face-to-face in the care of this patient, which includes all pre, intra, and post visit time on the date of service.  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  Interval development of large  mid abdominal mass highly concerning for right ovarian cancer.  There is mild omental nodularity on the left, and peritoneal disease cannot be completely excluded.  There is no ascites or other evidence of metastatic disease. 2.  Mild associated renal pelvocaliectasis bilaterally without obstruction.  Nonobstructing left renal calculus and a small right renal angiomyolipoma noted incidentally.   11/08/2012 Pathology Results   1. 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 - BENIGN FALLOPIAN TUBE WITH NONSPECIFIC CHRONIC INFLAMMATION. 2. Ovary and fallopian tube, left - BENIGN OVARY; NEGATIVE FOR ATYPIA OR MALIGNANCY. - BENIGN FALLOPIAN TUBE; NEGATIVE FOR ATYPIA OR MALIGNANCY. 3. Omentum, resection for tumor - HIGH GRADE CARCINOMA, SEE COMMENT. 4. Appendix, Other than Incidental - FIBROUS OBLITERATION OF APPENDICEAL TIP. - NEGATIVE FOR MALIGNANCY.   11/08/2012 Surgery   Surgery: Exploratory laparotomy, bilateral salpingo-oophorectomy, appendectomy, infacolic omentectomy, optimal debulking   Surgeons:  Paola A. Dodie, MD; Olam Mill, MD    Assistant: Inocente Liverpool  Pathology: Bilateral fallopian tubes and ovaries to pathology. Appendix as well as omentum. 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   Operative findings: 25 cm right adnexal mass with smooth surface. Surgically absent uterus. Atrophic-appearing left ovary. Normal appearing appendix. Within the omentum there were centimeter nodules scattered throughout the omentum. The remainder of the surfaces were benign.   12/08/2012 Procedure   Impression:  Placement of a subcutaneous port device.  The catheter tip is in the lower SVC and ready to be used.     12/13/2012 - 03/28/2013 Chemotherapy   s/p 6  cycles of paclitaxel  and carboplatin    12/13/2012 -  03/28/2013 Chemotherapy   The patient had 6 cycles of carboplatin  and Taxol    03/24/2013 Imaging   US  abdomen   04/24/2013 Imaging   CT abdomen Interval resection of the large right pelvic and lower abdominal mass lesion with apparent omentectomy.  No evidence for intraperitoneal free fluid on today's study.  No discernible peritoneal lesions.   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   09/07/2013 Genetic Testing   Patient has genetic testing done for BRCA1/2 panel Results revealed patient has no mutation(s):   07/09/2015 Imaging   CT abdomen 1. 12 mm obstructive calculus at the left ureteropelvic junction with moderate proximal hydronephrosis. 2. 2 small supraumbilical ventral hernias, one containing a short segment of the mid transverse colon and the other containing a short segment of the mid small bowel. There is no associated evidence to suggest bowel incarceration or obstruction at this time. 3. Tiny locule of gas non dependently in the lumen of the urinary bladder. This is presumably iatrogenic related to recent catheterization for urinalysis. Alternatively, this could be seen in the setting of urinary tract infection with gas-forming organisms. Clinical correlation for history of recent catheterization is recommended. 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 5. Status post cholecystectomy. 6. Additional incidental findings, as above.     12/11/2016 Imaging   Ct abdomen 1. No evidence of metastatic ovarian cancer. 2. Recurrent subxiphoid ventral abdominal wall hernia containing transverse colon. No evidence of incarceration or obstruction. 3. Stable incidental findings in the liver and kidneys. No recurrent urinary tract calculus. 4. Progressive lower lumbar spondylosis. 5.  Aortic Atherosclerosis (ICD10-I70.0).     08/30/2017 Imaging   MRI thoracic spine 1. At T5-6 there is a small central disc  protrusion contacting the ventral thoracic spinal cord. No central canal or foraminal stenosis. 2. At T9-10 there is a small right paracentral disc protrusion. 3.  No acute osseous injury of the thoracic spine. 4. No aggressive osseous lesion to suggest metastatic disease.   09/24/2017 Tumor Marker   Patient's tumor was tested for the following markers: CA-125 Results of the tumor marker test revealed 21.7   09/30/2017 Imaging   CT abdomen 1. New small clustered soft tissue nodules in the left lower quadrant in the sigmoid mesentery, largest 1.0 cm, which could represent recurrent peritoneal tumor implants. No ascites.  2. Midline high ventral abdominal wall hernia containing a portion of the transverse colon is mildly increased in size, and without bowel complication at this time. 3. Chronic findings include: Aortic Atherosclerosis (ICD10-I70.0). Diffuse hepatic steatosis. 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   10/11/2017 PET scan   1. Nodules in the sigmoid mesentery are hypermetabolic and highly worrisome for metastatic disease. 2. Attic steatosis.     11/03/2017 Procedure   Successful CT-guided rectus abdominal muscle mass core biopsy. Path: - FOREIGN BODY GIANT CELL REACTION INVOLVING FIBROADIPOSE TISSUE AND SKELETAL MUSCLE. - NO EVIDENCE OF MALIGNANCY.   11/04/2017 Cancer Staging   Staging form: Fallopian Tube, AJCC 7th Edition - Clinical: FIGO Stage IIIC, calculated as Stage IV (rT3, N0, M1) - Signed by Lonn Hicks, MD on 08/09/2019   02/2018 Imaging   CT: 1. Continued increase in size small peritoneal nodules along the mesenteric border of the proximal sigmoid colon as well as along the serosal surface of the proximal sigmoid colon. 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 2. No evidence of distant disease. 3. Stable large ventral hernia.  05/2018 Imaging   PET: 1. Redemonstration of  hypermetabolic nodules within the sigmoid mesocolon. Mild response to therapy relative to CT of 02/24/2018. Mixed response to therapy compared to the most recent PET of 10/11/2017. 2. Hypermetabolism within the right pelvic rectus musculature, increased since the prior PET.  Clinical service requested comparison to the 11/24/2017 CT. Index 10 mm nodule within the sigmoid mesocolon was similar to the 02/24/2018 CT, and as described on that exam, increased from 7 mm on 11/24/2017. More inferior nodule within the mesocolon measures 12 mm today on image 156/4 and 8 mm on 11/24/2017.   05/2018 Imaging   CT: IMPRESSION: 1. Since 02/24/2018, decreased size of peritoneal nodules centered in the sigmoid mesocolon. 2. No evidence of new or progressive disease. 3. Hepatic steatosis. 4. Subcentimeter right renal angiomyolipoma, similar. 5.  Aortic Atherosclerosis (ICD10-I70.0). 6. Ventral abdominal wall laxity containing transverse colon, similar.   08/2018 Imaging   CT: IMPRESSION: 1. Nodules within the sigmoid mesocolon have decreased in size compared to prior. No new peritoneal or omental nodularity. 2. No evidence local recurrence the pelvis. 3. Ventral hernia contains a segment of transverse colon. No change from prior.     06/19/2019 Imaging   1. No evidence of metastatic disease in the abdomen pelvis. 2. No peritoneal or omental metastasis identified.  No free fluid. 3. Postcholecystectomy and hysterectomy. Comparison exams are made available. Comparison CT 09/08/2018 and 05/27/2018. PET-CT 05/27/2018    There is a nodule within the proximal aspect of the sigmoid colon measuring 2.2 by 2.2 cm. In comparison to prior CTs and PET-CT there was a hypermetabolic nodule at this location on the PET-CT of 08/27/2017 and on the CT of 09/08/2018 there was a small residual nodule. At that time (09/08/2018) the nodule measured 1.4 by 1.3 cm. Therefore this nodule has increased in the interval and  concerning for recurrence of a serosal implant. The previous described lymph nodes in the sigmoid mesocolon and more central mesentery are not increased in size and not pathologic by size criteria.    Concern for recurrence of serosal metastasis in the proximal sigmoid colon with a 2 cm enlarging lesion. Lesion extends into the lumen. No evidence of high-grade obstruction. Consider FDG PET scan and/or potential colonoscopy for evaluation.   06/29/2019 PET scan   1. Enlarging serosal implant within the proximal sigmoid colon has intense metabolic activity most consistent with malignancy. Lesion exhibits a somewhat indolent progression as present on PET-CT scan from 10/11/2017 and 05/27/2018. 2. Focal hypermetabolic activity within the RIGHT rectus muscle just off midline is slightly decreased from comparison exam. This may be benign inflammation related to prior laparotomy, however malignancy not excluded.   07/26/2019 Procedure   She had colonoscopy which showed multiple polyps.  6 polyps were removed from the cecum, measuring 3 to 6 mm in size.  One 12 mm polyp was removed from ascending colon and another 1 measures 7 mm.  One 5 mm polyp was removed from the transverse colon.  There is tumor noted in the sigmoid colon, approximately 35 cm from the anus which was biopsied.  The tumor appeared to be fungating, infiltrative and ulcerated but nonobstructive.  It encompassed approximately one third of the circumference of the lumen.   07/26/2019 Pathology Results   Multiple polyps came back tubular adenomas.  2 ascending polyps came back sessile serrated adenoma months.  Sigmoid colon biopsy confirmed metastatic high-grade serous carcinoma.  The morphology and immunophenotype are consistent with metastatic high-grade serous carcinoma  from tubal/ovarian primary site.   08/17/2019 - 12/12/2019 Chemotherapy   The patient had carboplatin  and taxol    10/23/2019 Imaging   1. Sigmoid lesion with diminished  size/conspicuity difficult to measure on the previous exam. Adjacent lymph nodes and nodularity less than a cm, largest on coronal image 48 measuring 5 mm. 2. Right rectus muscle with some thickening with mildly convex margin seen posteriorly on image 72 of series 2. This could be due to postsurgical change, however, metastatic disease is not excluded. Attention on follow-up is suggested. 3. No evidence for new metastatic disease. 4. Small hiatal hernia. 5. 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   Aortic Atherosclerosis (ICD10-I70.0).   01/01/2020 Imaging   1. No signs of new disease. 2. Tiny lymph nodes in the area of concern in the LEFT lower quadrant z. 3. Added density and subtle contour irregularity involving the rectus muscles best seen on sagittal images, associated with site of prior surgical incision just to the RIGHT of midline (image 72, series 2 and image 58, series 5. Not significantly changed compared to prior studies. This measures approximately 2.1 x 1 cm in the sagittal plane and is less well-defined in the axial plane. Potentially postoperative change. Attention on follow-up. 4. Aortic atherosclerosis.   Aortic Atherosclerosis (ICD10-I70.0).       01/29/2020 - 04/07/2022 Chemotherapy   Patient is on Treatment Plan : Ovarian Bevacizumab      05/06/2020 Imaging   1. Status post hysterectomy and oophorectomy. 2. No specific findings of recurrent or metastatic disease in the abdomen or pelvis. 3. No change in postoperative appearance of low midline incision. 4. Unchanged small prominent subcentimeter left iliac lymph nodes.   10/22/2020 Imaging   1. Status post hysterectomy and oophorectomy. No evidence of recurrent or metastatic disease within the abdomen or pelvis. 2. No significant change in the subcentimeter prominent left iliac lymph nodes.   04/11/2021 Imaging   1. No findings to suggest recurrent or metastatic disease in the abdomen or pelvis. 2. Aortic  atherosclerosis, in addition to at least left anterior descending coronary artery disease. Assessment for potential risk factor modification, dietary therapy or pharmacologic therapy may be warranted, if clinically indicated. 3. Additional incidental findings, as above.   10/15/2021 Imaging   Stable exam. No evidence of recurrent or metastatic carcinoma within the abdomen or pelvis.   Stable tiny benign right renal angiomyolipoma.   Aortic Atherosclerosis (ICD10-I70.0).     04/20/2022 Imaging   1. Stable exam. No new or progressive interval findings to suggest recurrent or metastatic disease. 2. Stable tiny benign right adrenal angiomyolipoma. 3.  Aortic Atherosclerois (ICD10-170.0)   11/10/2022 Procedure   Successful removal of an implanted RIGHT chest Port-A-Cath    10/11/2023 Imaging   CT ABDOMEN PELVIS W CONTRAST Result Date: 10/17/2023 CLINICAL DATA:  Follow-up ovarian/fallopian tube carcinoma. * Tracking Code: BO * EXAM: CT ABDOMEN AND PELVIS WITH CONTRAST TECHNIQUE: Multidetector CT imaging of the abdomen and pelvis was performed using the standard protocol following bolus administration of intravenous contrast. RADIATION DOSE REDUCTION: This exam was performed according to the departmental dose-optimization program which includes automated exposure control, adjustment of the mA and/or kV according to patient size and/or use of iterative reconstruction technique. CONTRAST:  OMNIPAQUE  IOHEXOL  300 MG/ML  SOLN COMPARISON:  10/15/2022 FINDINGS: Lower Chest: No acute findings. Hepatobiliary: Stable small low-attenuation lesion in the left lobe adjacent to the falciform ligament, consistent with benign etiology. No new or enlarging liver lesions identified. Prior cholecystectomy. No evidence  of biliary obstruction. Pancreas:  No mass or inflammatory changes. Spleen: Within normal limits in size and appearance. Adrenals/Urinary Tract: Stable 1 cm fat attenuation mass in right kidney, consistent  with benign angiomyolipoma (No followup imaging is recommended). No evidence of ureteral calculi or hydronephrosis. Unremarkable unopacified urinary bladder. Stomach/Bowel: No evidence of obstruction, inflammatory process or abnormal fluid collections. Vascular/Lymphatic: No pathologically enlarged lymph nodes. No acute vascular findings. Reproductive: Prior hysterectomy noted. Adnexal regions are unremarkable in appearance. No evidence of pelvic mass, peritoneal thickening, or ascites. Other:  None. Musculoskeletal:  No suspicious bone lesions identified. IMPRESSION: Stable exam.  No evidence of recurrent or metastatic disease. Electronically Signed   By: Norleen DELENA Kil M.D.   On: 10/17/2023 15:08        Interval History: Reports that she is doing so-so.  Mostly this is due to her back.  She still dealing with back pain and severe sciatica.  However, she reports that she is working with a provider that seems to be helping getting her into PT and helping with prescriptions.  Otherwise notes an area above her anus on her backside which occasionally seems to flake off and open at times.  Also notes some ongoing weight loss which she attributes to issues with her back pain.  Has had some constipation that is now improved.  Underwent a MRI of her hip on 03/04/2024 which showed no intrapelvic abnormalities. She otherwise denies new vaginal bleeding, pelvic pain, change of bladder habits, back to baseline eating/drinking, no early satiety.   Past Medical/Surgical History: Past Medical History:  Diagnosis Date   Burning mouth syndrome    Chronic fatigue    Constipation    Diabetes (HCC) 05/09/2018   Diarrhea    in the past after gallbladder removal   Fibromyalgia    GERD (gastroesophageal reflux disease)    History of kidney stones 07/2015   Hyperlipidemia    Hypertension    borderline not on meds    Insomnia secondary to depression with anxiety    Lumbar disc disease    Ovarian cancer (HCC)  2014/2020   met nodule in abd   Pelvic mass in female    Pneumonia    hx of pneumonia as an infant   PONV (postoperative nausea and vomiting)    pain from gas hernia 2017   Shortness of breath    with exertion    Sleep apnea    uses CPAP   Yeast infection     Past Surgical History:  Procedure Laterality Date   ABDOMINAL HYSTERECTOMY     early 32s   APPENDECTOMY     WITH DEBULKING/BSO   CHOLECYSTECTOMY     early 39s   CYSTOSCOPY W/ URETERAL STENT PLACEMENT Left 07/09/2015   DUE TO NEPHROLITHIASIS Procedure: CYSTOSCOPY WITH LEFT RETROGRADE PYELOGRAM/ LEFT URETERAL STENT PLACEMENT;  Surgeon: Morene LELON Salines, MD;  Location: [facility];  Service: Urology;  Laterality: Left;   HERNIA REPAIR     INCISIONAL HERNIA REPAIR N/A 12/25/2015   WITH MESH Procedure:  INCISIONAL HERNIA REPAIR;  Surgeon: Lynda Leos, MD;  Location: [facility];  Service: General;  Laterality: N/A;   INCISIONAL HERNIA REPAIR N/A 10/21/2018   Procedure: LAPAROSCOPIC INCISIONAL HERNIA REPAIR WITH MESH;  Surgeon: Leos Lynda, MD;  Location: [facility] OR;  Service: General;  Laterality: N/A;   INSERTION OF MESH N/A 12/25/2015   Procedure: INSERTION OF MESH;  Surgeon: Lynda Leos, MD;  Location: [facility];  Service: General;  Laterality: N/A;  IR REMOVAL TUN ACCESS W/ PORT W/O FL MOD SED  11/10/2022   LAPAROTOMY Bilateral 11/08/2012   Procedure: EXPLORATORY LAPAROTOMY BILATERAL SALPINGO OOPHORECTOMY TUMOR DEBULKING ;  Surgeon: Elenore A. Dodie, MD;  Location: [facility];  Service: Gynecology;  Laterality: Bilateral;  APPENDECTOMY / OMENTECTOMY   LAPAROTOMY N/A 12/25/2015   Procedure: EXPLORATORY LAPAROTOMY;  Surgeon: Lynda Leos, MD;  Location: [facility];  Service: General;  Laterality: N/A;   LYSIS OF ADHESION N/A 12/25/2015   Procedure: LYSIS OF ADHESION;  Surgeon: Lynda Leos, MD;  Location: [facility];  Service: General;  Laterality: N/A;   PARTIAL HYSTERECTOMY  05-23-2005   TRIGGER FINGER RELEASE      Family History   Problem Relation Age of Onset   Lung cancer Mother 100   High blood pressure Mother    High Cholesterol Mother    Lung cancer Father 47       2 ppd smoker   Parkinson's disease Father    Kidney disease Other     Social History   Socioeconomic History   Marital status: Widowed    Spouse name: Aida   Number of children: 2   Years of education: Master's   Highest education level: Not on file  Occupational History   Not on file  Tobacco Use   Smoking status: Never   Smokeless tobacco: Never  Vaping Use   Vaping status: Never Used  Substance and Sexual Activity   Alcohol use: No   Drug use: No   Sexual activity: Not on file  Other Topics Concern   Not on file  Social History Narrative   Patient is married Junious). Husband passed away in 2015-05-24   Patient has 2 children by birth and 13 children all together.   Patient has a Scientist, water quality.   Patient is right-handed.   Patient drinks very little caffeine.         Social Drivers of Corporate investment banker Strain: Not on file  Food Insecurity: No Food Insecurity (01/21/2024)   Hunger Vital Sign    Worried About Running Out of Food in the Last Year: Never true    Ran Out of Food in the Last Year: Never true  Transportation Needs: No Transportation Needs (01/21/2024)   PRAPARE - Administrator, Civil Service (Medical): No    Lack of Transportation (Non-Medical): No  Physical Activity: Not on file  Stress: Not on file  Social Connections: Not on file    Current Medications:  Current Outpatient Medications:    acetaminophen  (TYLENOL ) 500 MG tablet, Take 500 mg by mouth every 6 (six) hours as needed for mild pain., Disp: , Rfl:    amLODipine  (NORVASC ) 10 MG tablet, Take 1 tablet (10 mg total) by mouth daily., Disp: 30 tablet, Rfl: 11   busPIRone  (BUSPAR ) 15 MG tablet, Take 15 mg by mouth 2 (two) times daily., Disp: , Rfl:    calcium  carbonate (OSCAL) 1500 (600 Ca) MG TABS tablet, Take by mouth daily with  breakfast., Disp: , Rfl:    chlorthalidone  (HYGROTON ) 25 MG tablet, Take 25 mg by mouth daily., Disp: , Rfl:    cholecalciferol (VITAMIN D3) 25 MCG (1000 UT) tablet, Take 1,000 Units by mouth daily., Disp: , Rfl:    colestipol  (COLESTID ) 1 g tablet, Take 1 g by mouth at bedtime. Take 1 tablet in the evening, 1/2 tablet in the morning, Disp: , Rfl:    [START ON 05/04/2024] Eszopiclone  3 MG TABS, TAKE 1 TABLET  BY MOUTH EVERYDAY AT BEDTIME, Disp: 30 tablet, Rfl: 5   ezetimibe (ZETIA) 10 MG tablet, Take 10 mg by mouth every morning., Disp: , Rfl:    FIBER PO, Take by mouth., Disp: , Rfl:    FLUoxetine  (PROZAC ) 20 MG capsule, Take 40 mg by mouth every morning., Disp: , Rfl:    loperamide (IMODIUM) 2 MG capsule, Take by mouth as needed for diarrhea or loose stools., Disp: , Rfl:    Melatonin 10 MG TABS, Take 10 mg by mouth at bedtime., Disp: , Rfl:    metFORMIN  (GLUCOPHAGE -XR) 500 MG 24 hr tablet, Take 500 mg by mouth daily with breakfast. , Disp: , Rfl: 3   metoprolol  tartrate (LOPRESSOR ) 25 MG tablet, Take 1 tablet (25 mg total) by mouth 2 (two) times daily., Disp: 60 tablet, Rfl: 3   modafinil  (PROVIGIL ) 200 MG tablet, Take 1 tablet (200 mg total) by mouth daily., Disp: 90 tablet, Rfl: 3   Multiple Vitamin (MULTIVITAMIN WITH MINERALS) TABS, Take 1 tablet by mouth every morning. Womens 50+, Disp: , Rfl:    rosuvastatin  (CRESTOR ) 40 MG tablet, , Disp: , Rfl:    telmisartan  (MICARDIS ) 40 MG tablet, Take 40 mg by mouth daily., Disp: , Rfl:   Review of Symptoms: Complete 10-system review is positive for: Constipation, diarrhea, joint pain, back pain, headache, dizziness, leg cramp, weight loss, itching, problem walking  Physical Exam: BP 120/66 (BP Location: Right Arm, Patient Position: Sitting)   Pulse 74   Temp 98.1 F (36.7 C) (Oral)   Resp 19   Wt 149 lb 8 oz (67.8 kg)   SpO2 98%   BMI 29.44 kg/m  General: Alert, oriented, no acute distress. HEENT: Normocephalic, atraumatic. Neck symmetric  without masses. Sclera anicteric.  Chest: Normal work of breathing. Clear to auscultation bilaterally.   Cardiovascular: Regular rate and rhythm, no murmurs. Abdomen: Soft, nontender.  Normoactive bowel sounds.  No masses appreciated.  Well-healed incisions. Extremities: Grossly normal range of motion.  Warm, well perfused.  No edema bilaterally. Skin: In gluteal cleft, pin point opening with sound surrounding pink skin, no fluctuance, warmth or drainage Lymphatics: No cervical, supraclavicular, or inguinal adenopathy. GU: Normal appearing external genitalia without erythema, excoriation, or lesions. Speculum exam reveals atrophic vaginal mucosa.  Bimanual exam reveals smooth vaginal cuff. No nodularity or pelvic mass. Exam chaperoned by Kimberly [place], CMA     Laboratory & Radiologic Studies: None

## 2024-04-24 NOTE — Patient Instructions (Signed)
 It was a pleasure to see you in clinic today. - Exam reassuring today - Recommend trial of desitin cream for your sacrum - Return visit planned for 6months  Thank you very much for allowing me to provide care for you today.  I appreciate your confidence in choosing our Gynecologic Oncology team at [facility].  If you have any questions about your visit today please call our office or send us  a MyChart message and we will get back to you as soon as possible.

## 2024-04-26 DIAGNOSIS — M6281 Muscle weakness (generalized): Secondary | ICD-10-CM | POA: Diagnosis not present

## 2024-05-01 DIAGNOSIS — M5432 Sciatica, left side: Secondary | ICD-10-CM | POA: Diagnosis not present

## 2024-05-04 DIAGNOSIS — M6281 Muscle weakness (generalized): Secondary | ICD-10-CM | POA: Diagnosis not present

## 2024-05-09 DIAGNOSIS — I10 Essential (primary) hypertension: Secondary | ICD-10-CM | POA: Diagnosis not present

## 2024-05-09 DIAGNOSIS — E1159 Type 2 diabetes mellitus with other circulatory complications: Secondary | ICD-10-CM | POA: Diagnosis not present

## 2024-05-09 DIAGNOSIS — I2510 Atherosclerotic heart disease of native coronary artery without angina pectoris: Secondary | ICD-10-CM | POA: Diagnosis not present

## 2024-05-09 DIAGNOSIS — E1121 Type 2 diabetes mellitus with diabetic nephropathy: Secondary | ICD-10-CM | POA: Diagnosis not present

## 2024-05-11 DIAGNOSIS — M5442 Lumbago with sciatica, left side: Secondary | ICD-10-CM | POA: Diagnosis not present

## 2024-05-11 DIAGNOSIS — M6281 Muscle weakness (generalized): Secondary | ICD-10-CM | POA: Diagnosis not present

## 2024-05-11 DIAGNOSIS — M5432 Sciatica, left side: Secondary | ICD-10-CM | POA: Diagnosis not present

## 2024-05-16 DIAGNOSIS — M5442 Lumbago with sciatica, left side: Secondary | ICD-10-CM | POA: Diagnosis not present

## 2024-05-16 DIAGNOSIS — M5432 Sciatica, left side: Secondary | ICD-10-CM | POA: Diagnosis not present

## 2024-05-16 DIAGNOSIS — M6281 Muscle weakness (generalized): Secondary | ICD-10-CM | POA: Diagnosis not present

## 2024-05-17 DIAGNOSIS — M6281 Muscle weakness (generalized): Secondary | ICD-10-CM | POA: Diagnosis not present

## 2024-05-17 DIAGNOSIS — M5432 Sciatica, left side: Secondary | ICD-10-CM | POA: Diagnosis not present

## 2024-05-17 DIAGNOSIS — M5442 Lumbago with sciatica, left side: Secondary | ICD-10-CM | POA: Diagnosis not present

## 2024-05-18 DIAGNOSIS — M5432 Sciatica, left side: Secondary | ICD-10-CM | POA: Diagnosis not present

## 2024-05-23 DIAGNOSIS — M5442 Lumbago with sciatica, left side: Secondary | ICD-10-CM | POA: Diagnosis not present

## 2024-05-23 DIAGNOSIS — M6281 Muscle weakness (generalized): Secondary | ICD-10-CM | POA: Diagnosis not present

## 2024-05-23 DIAGNOSIS — M5432 Sciatica, left side: Secondary | ICD-10-CM | POA: Diagnosis not present

## 2024-05-24 DIAGNOSIS — M5442 Lumbago with sciatica, left side: Secondary | ICD-10-CM | POA: Diagnosis not present

## 2024-05-24 DIAGNOSIS — M6281 Muscle weakness (generalized): Secondary | ICD-10-CM | POA: Diagnosis not present

## 2024-05-24 DIAGNOSIS — M5432 Sciatica, left side: Secondary | ICD-10-CM | POA: Diagnosis not present

## 2024-05-30 DIAGNOSIS — E1121 Type 2 diabetes mellitus with diabetic nephropathy: Secondary | ICD-10-CM | POA: Diagnosis not present

## 2024-05-30 DIAGNOSIS — M5442 Lumbago with sciatica, left side: Secondary | ICD-10-CM | POA: Diagnosis not present

## 2024-05-30 DIAGNOSIS — M5432 Sciatica, left side: Secondary | ICD-10-CM | POA: Diagnosis not present

## 2024-05-30 DIAGNOSIS — R801 Persistent proteinuria, unspecified: Secondary | ICD-10-CM | POA: Diagnosis not present

## 2024-06-01 DIAGNOSIS — M6281 Muscle weakness (generalized): Secondary | ICD-10-CM | POA: Diagnosis not present

## 2024-06-01 DIAGNOSIS — M5432 Sciatica, left side: Secondary | ICD-10-CM | POA: Diagnosis not present

## 2024-06-01 DIAGNOSIS — M5442 Lumbago with sciatica, left side: Secondary | ICD-10-CM | POA: Diagnosis not present

## 2024-06-05 DIAGNOSIS — F411 Generalized anxiety disorder: Secondary | ICD-10-CM | POA: Diagnosis not present

## 2024-06-05 DIAGNOSIS — M5432 Sciatica, left side: Secondary | ICD-10-CM | POA: Diagnosis not present

## 2024-06-05 DIAGNOSIS — Z23 Encounter for immunization: Secondary | ICD-10-CM | POA: Diagnosis not present

## 2024-06-05 DIAGNOSIS — E782 Mixed hyperlipidemia: Secondary | ICD-10-CM | POA: Diagnosis not present

## 2024-06-05 DIAGNOSIS — G47419 Narcolepsy without cataplexy: Secondary | ICD-10-CM | POA: Diagnosis not present

## 2024-06-05 DIAGNOSIS — G4733 Obstructive sleep apnea (adult) (pediatric): Secondary | ICD-10-CM | POA: Diagnosis not present

## 2024-06-05 DIAGNOSIS — I10 Essential (primary) hypertension: Secondary | ICD-10-CM | POA: Diagnosis not present

## 2024-06-05 DIAGNOSIS — R801 Persistent proteinuria, unspecified: Secondary | ICD-10-CM | POA: Diagnosis not present

## 2024-06-05 DIAGNOSIS — E1121 Type 2 diabetes mellitus with diabetic nephropathy: Secondary | ICD-10-CM | POA: Diagnosis not present

## 2024-06-09 DIAGNOSIS — E1121 Type 2 diabetes mellitus with diabetic nephropathy: Secondary | ICD-10-CM | POA: Diagnosis not present

## 2024-06-09 DIAGNOSIS — I2510 Atherosclerotic heart disease of native coronary artery without angina pectoris: Secondary | ICD-10-CM | POA: Diagnosis not present

## 2024-06-09 DIAGNOSIS — E1159 Type 2 diabetes mellitus with other circulatory complications: Secondary | ICD-10-CM | POA: Diagnosis not present

## 2024-06-09 DIAGNOSIS — I10 Essential (primary) hypertension: Secondary | ICD-10-CM | POA: Diagnosis not present

## 2024-06-12 ENCOUNTER — Other Ambulatory Visit: Payer: Self-pay | Admitting: Medical Genetics

## 2024-06-12 ENCOUNTER — Encounter: Payer: Self-pay | Admitting: Radiology

## 2024-06-12 DIAGNOSIS — Z006 Encounter for examination for normal comparison and control in clinical research program: Secondary | ICD-10-CM

## 2024-06-12 NOTE — Progress Notes (Unsigned)
 PATIENT: Kristin Webb DOB: 1946-05-12  REASON FOR VISIT: follow up HISTORY FROM: patient  No chief complaint on file.    HISTORY OF PRESENT ILLNESS:  06/12/24 ALL:  Kristin Webb returns for follow up for OSA on CPAP, excessive daytime sleepiness and insomnia. She continues eszopiclone  3mg  at bedtime and modafinil  200mg  daily.   She continues to do well on CPAP therapy.     05/13/2023 ALL:  Kristin Webb returns for follow up for OSA on CPAP, insomnia and excessive daytime sleepiness. She continues to do well on CPAP therapy. She is using therapy nightly for about 6-8 hours but usually alseep for 5-6 hours. She continues to note water chamber runs dry. She does feel that she rests better and breathes better during the day when using CPAP. She uses her travel machine 3 times a year, on average. She continues eszopiclone  3mg  at bedtime. She continues modafinil  200mg  daily. She feels medications help. She is planning lumbar surgery with Dr Gillie 06/2023.    10-27-2022 CD:  unpleasant dry mouth in AM, CPAP machine has a smaller reservoir. Water runs out every night by 3-4 AM and her humidifier setting is middle range at 3. Highly compliant patient - she has  been 97% by days and 93% by time of nightly use with an average of 8 hours 21 minutes.  Her CPAP is set at a pressure of 16 cm water with 3 cm expiratory relief.  Her AHI is 3.9/h there are very few central apneas arising the majority of events are hypopneas and are considered obstructive.  She does have a moderate high air leak at the 95th percentile of 33.5 L a minute.  She does sometimes snore through the machine as indicated by the RERA index of 1.5/h. [place] stokes breathing was detected.    Highly compliant patient - she has  been 97% by days and 93% by time of nightly use with an average of 8 hours 21 minutes.  Her CPAP is set at a pressure of 16 cm water with 3 cm expiratory relief.  Her AHI is 3.9/h there are very few central apneas arising  the majority of events are hypopneas and are considered obstructive.  She does have a moderate high air leak at the 95th percentile of 33.5 L a minute.  She does sometimes snore through the machine as indicated by the RERA index of 1.5/h. Che[place] stokes breathing was detected. Kristin Webb   She was cancer free on her last scans, Dr Lonn is her oncologist. Fatigue remains. She was diagnosed with TYPE 2 DM on metformin .  Diet adjusted.   No burning mouth concerns, rather dry mouth. No more epistaxis.  04/28/2022 ALL: [name] returns for follow up for OSA on CPAP, EDS and insomnia.   She reports doing well on her new CPAP machine. She is using it every night. She denies concerns with machine or supplies.   She continues eszopiclone  3mg  QHS. She also takes melatonin 10mg  daily. Belsomra  did not help at all. Ambien  caused sleep walking. Trazodone  caused significant side effects. She is taking modafinil  200mg  daily.  She has stopped bevacizumab  infusions. Scans have looked good. She plans follow up with oncology for 5 years with regular surveillance labs.     09/24/2020 ALL: She returns for follow up for OSA on CPAP, EDS and insomnia. She continues CPAP nightly. Lunesta  2mg  helps with insomnia. She takes armodafinil  250mg  as needed for EDS and fatigue. She is not sure if these medications are working.  She reports that since the holidays, she has had difficulty waking every 3-5 hours. She is having more negative dreams. She has been seen by PCP who was concerned about anxiety related to stage IV cancer diagnosis on Avastin  (started about 6-8 months ago). Fluoxetine  was increased to 40mg  daily and she was started on buspirone  15mg  BID about 2 months ago. She is seeing a veterinary surgeon. PCP has recommended psychiatry if not responding to fluoxetine  and buspirone . She does feel medications are helping.   Compliance report dated 08/24/2020 through 09/22/2020 reveals that she used CPAP 30 of the past 30 days for compliance  of 100%.  She used CPAP greater than 4 hours 28 of the past 30 days for compliance of 93%.  Average usage on days used was 7 hours and 15 minutes.  Residual AHI was 1.7 on a set pressure of 16 cm of water.  There was a leak noted in the 95th percentile of 27.8 L/min.  09/25/2019 ALL:  Kristin Webb is a 78 y.o. female here today for follow up for OSA on CPAP.  Overall, she is doing fairly well with her CPAP machine.  She has recently restarted chemotherapy for stage IV ovarian cancer.  She is anticipating receiving her third of 6 total treatments this week.  She will then have a PET scan to determine how long therapy will need to be continued.  There is also a question of possible need for colon resection as tumor is attached to part of her colon.  She has noted that her skin is more sensitive.  She has bags under her eyes that she feels are pinched with the current full facemask she is using. Currently using ResMed Airfit small FFM.  She does have more difficulty falling asleep.  She is now using melatonin 10 mg and Benadryl  25 mg about an hour before bedtime.  She continues Lunesta  to help her stay asleep.  Armodafinil  does help with daytime sleepiness.  She does not use this medication on chemotherapy days as she feels there is no benefit.  She does endorse increasing fatigue and feels this is related to her chemotherapy.  Compliance report dated 08/21/2018 08/20/2019 reveals that she used CPAP 30 out of the last 30 days for compliance of 100%.  She used CPAP greater than 4 hours 28 of the last 30 days for compliance of 93%.  Average usage was 8 hours and 44 minutes.  Residual AHI was 1.2 on 16 cm of water and EPR of 1.  There was no significant leak noted.  HISTORY: (copied from Dr Dohmeier's note on 05/23/2019)  HPI:  Kristin Webb is a 78 y.o. female patient with a history of OSA on CPAP -and persistent fatigue, is seen here in a revisit for CPAP follow up/ compliance visit.  She is a Optician, Dispensing,  has a large social circle. She is living alone with her rescue dog, and is optimistic and happy.  Rv 05-23-2019-I have the pleasure of looking at the data download for Mrs. Selma Mink encompassing 30 days before 21 May 2019.  She has been 100% compliant with an average usage time of 8 hours 90 minutes, minimum pressure is 5 maximum pressure is applied is 15 cmH2O with 1 cm expiratory pressure relief.  Residual AHI is 1.4 which is an excellent resolution she has minimal air leaks her 95th percentile pressure is 14.9cm  this may be related to some weight gain. She has used melatonin, benadryl .    Mrs. Guys  is seen here on 09 May 2018, she has been a highly compliant CPAP user, her downloaded data however have to be combined from 2 different machines.  For general use at home she has an air sense 10 AutoSet machine and then she has a travel CPAP as well.  The average user time on days used is 8 hours and 59 minutes, she has used CPAP in one form or another for 97% of the recorded time, she is using AutoSet 5-15 cmH2O was 1 cm EPR level the 95th percentile pressure required is 14.5 which straddles the upper limit.  Residual AHI is 0.8 which is excellent and there are no major air leaks.  She has gained weight since I saw her last and her BMI rose by over 4 points this may explain why she needs a higher setting at this time.  Her Epworth Sleepiness Scale was endorsed at 13 out of 24 possible points and her fatigue severity at 44 out of 63 possible points, the geriatric depression scale was endorsed at 1 out of 15 points.   Interval history from 05/05/2017, Mrs. Krizek is doing well, she is living by herself with her dog, recently had some home restoration to do. This added a little bit of hectic to her life, however she is doing well and her counselor has not progressed. She is followed by a new oncologist, Dr. Dodie. She underwent another HST following significant weight gain, AHI was 9.1  /hr. She has many desaturations and was placed on auto CPAP in response to high EDS with mild apnea.  Her compliance to CPAP with excellent at 90% with an average use of 8 hours and 32 minutes, she is using an AutoSet between 5 and 15 cm water was 1 cm EPR and her residual AHI is 1.9. This is excellent resolution of her apnea. The 95th percentile pressure is 14.8, there were no central apneas emerging, there is no need to adjust the machine in any way. She endorsed the Epworth Sleepiness Scale at 9 points, fatigue severity at 44 points and the geriatric depression score is 1 out of 15 points. She continues to participate in a grief group after her husband's death. She started Yoga, Zen knitting and fosters dogs.    12-12-13,Since I have seen her last this patient was diagnosed with ovarian cancer, underwent surgery and chemotherapy. She currently struggles with a weak  abdominal and core musculatur  and it feels to her she had an abdominal hernia. She has tried a corsett , but this has aggravated her bulging discs in her spine.  The patient was originally referred by Dr. Frederik at the time with fatigue , pain, excessive sleepiness and burning mouth syndrome. In 2010 she developed transiently left mouth droop, and a TIA/ CVA work up was negative. EMG and nerve conduction studies were unrevealing , a rheumatology consult,  pain specialist , neurosurgical evaluation were unrevealing .  Labs revealed a vitamin D  deficiency,  normal TSH and CMET, CBC.  Her neurosurgery consult for back pain was without results.   She was referred to me for a sleep consultation in 2009. The patient was diagnosed with sleep apnea on 03-07-08 with an AHI of 24.3 and in REM AHI of 82. Her BMI at the time was 37.9 , she was titrated to 10 cm water pressure on CPAP  which relieved her AHI but she still snored.  Mrs. Falls's newest  sleep study dated 11-26-13 whowed  residual apnea. The study  was therefore a normal polysomnography and  not split into a titration parts. The AHI was 0.9, the RDI was 2.4 and the oxygen nadir was 87% time and his saturation was 33.6 minutes at or below 90% saturation of oxygen. Heart which was regular, normal sinus rhythm prevailed. She still sleeps better with Lunesta  2 mg could not get a good result from 1 mg pills. Fragmentation of sleep, alpha intrusion can be seen in chronic pain and fatigue.She is not a coffee drinker, there is no caffeine to be cut out. There is no pain in sleep. Sleep psychology referral is problematic with [facility]. Sleep hygiene was discussed.    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 She has compliantly  used a  Original machine,  New the CPAP  machine seems to have failed. I am unable to obtain any data. In addition the patient had remained excessively fatigued and daytime sleepy. Possibly , this could have been a paraneoplastic manifestation of ovarian malignancy. She has been using Nuvigil  as tolerated which has given her improvement in life quality and energy.     The last CPAP  download was on 05-06-12 with the 95% percentile pressure of 12 cm water, the residual AHI of 0.5 and an average of 5.9 hours of night-user time for CPAP.  Interval history 12-24-14,Mrs. 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 . Mrs. Hoffart since being diagnosed with ovarian cancer has underwent a lot of emotional changes as well. Her oncologist, Dr. Camella, feels that some of the nausea is GERD-   So far since last week has not been a palpable results. The patient has also tried to wean off her sleep aids but became panicked and insomniac again.Mr. Mazariego had been admitted to the hospital after a spot on his lung was incidentally found , while being evaluated for kidney stones. He went into acute respiratory distress was placed on oxygen oxygen and readmitted  to the hospital but never recovered. And during that time she needed to return to her previous insomnia regimen. She is under financial distress, had a lot of difficulties since her husbands death.   CD Interval history from 05/18/2016, Mrs. Gaskins has been able to cut down on her Lunesta  dose from 2 mg to 1 mg and uses if necessary Nuvigil  to drive. She has slept usually uninterrupted as of last summer also thinks have changed again in 2017 now she begun in August to wake up 3-4 times a week in AM with a headache, very unusual for her. Nausea is associated with these headaches, no photophobia or phonophobia is reported. And as of August she has needed and used Nuvigil  125 g daily again. Not waking up in the night, told that she snores by a roommate during a church trip. Her fatigue is reminiscent of the times when she had suffered from sleep apnea.    CD Interval history from 07/15/2016, Mrs. Sakurai is here to see the results of her recent home sleep test, performed on 06/10/2016. Her AHI was 2.2 her RDI was 5 per hour there was no significant sleep apnea noted no oxygen desaturation was found, and she did not have tachycardia or bradycardia arrhythmia. Her fatigue has remained her Epworth sleepiness score has actually increased to 12 point from 10 during our last visit. She states that she often only gets up in the morning before because she has to let the  dog out but then feels that she needs another hour of sleep afterwards. She also suffers from dry mouth and dry eyes. There is a possible autoimmune component, and she does suffer from fibromyalgia.  She does not endorse a significant depression score since she lost her husband she has been grieving but she has not fallen into a deep hole. She sleeps with the help of Lunesta  1 mg and Benadryl , she has failed Ambien  (which caused her to sleep walk). She was also significantly more drowsy in daytime. She could neither tolerate Ambien  10 not 5  mg.   REVIEW OF SYSTEMS: Out of a complete 14 system review of symptoms, the patient complains only of the following symptoms, insomnia, apnea, frequent waking, daytime sleepiness, snoring, dizziness, headaches and all other reviewed systems are negative.  Epworth Sleepiness Scale: 8/24   ALLERGIES: Allergies  Allergen Reactions   Ambien  [Zolpidem  Tartrate] Other (See Comments)    Sleep walking   Bee Venom Anaphylaxis and Swelling    Difficulty breathing, carries an EPI-pen   Cortisone Nausea And Vomiting, Swelling and Other (See Comments)    INJECTED CORTISONE ONLY, sick to my stomach, throwing up, hand swelling, and pain.    HOME MEDICATIONS: Outpatient Medications Prior to Visit  Medication Sig Dispense Refill   acetaminophen  (TYLENOL ) 500 MG tablet Take 500 mg by mouth every 6 (six) hours as needed for mild pain.     amLODipine  (NORVASC ) 10 MG tablet Take 1 tablet (10 mg total) by mouth daily. 30 tablet 11   busPIRone  (BUSPAR ) 15 MG tablet Take 15 mg by mouth 2 (two) times daily.     chlorthalidone  (HYGROTON ) 25 MG tablet Take 25 mg by mouth daily.     cholecalciferol (VITAMIN D3) 25 MCG (1000 UT) tablet Take 1,000 Units by mouth daily.     colestipol  (COLESTID ) 1 g tablet Take 1 g by mouth at bedtime. Take 1 tablet in the evening, 1/2 tablet in the morning     Eszopiclone  3 MG TABS TAKE 1 TABLET BY MOUTH EVERYDAY AT BEDTIME 30 tablet 5   ezetimibe (ZETIA) 10 MG tablet Take 10 mg by mouth every morning.     FLUoxetine  (PROZAC ) 20 MG capsule Take 40 mg by mouth every morning.     loperamide (IMODIUM) 2 MG capsule Take by mouth as needed for diarrhea or loose stools.     Melatonin 10 MG TABS Take 10 mg by mouth at bedtime.     metFORMIN  (GLUCOPHAGE -XR) 500 MG 24 hr tablet Take 500 mg by mouth daily with breakfast.   3   metoprolol  tartrate (LOPRESSOR ) 25 MG tablet Take 1 tablet (25 mg total) by mouth 2 (two) times daily. 60 tablet 3   modafinil  (PROVIGIL ) 200 MG tablet Take 1  tablet (200 mg total) by mouth daily. 90 tablet 3   Multiple Vitamin (MULTIVITAMIN WITH MINERALS) TABS Take 1 tablet by mouth every morning. Womens 50+     rosuvastatin  (CRESTOR ) 40 MG tablet      telmisartan  (MICARDIS ) 40 MG tablet Take 40 mg by mouth daily.     No facility-administered medications prior to visit.    PAST MEDICAL HISTORY: Past Medical History:  Diagnosis Date   Burning mouth syndrome    Chronic fatigue    Constipation    Diabetes (HCC) 05/09/2018   Diarrhea    in the past after gallbladder removal   Fibromyalgia    GERD (gastroesophageal reflux disease)    History of kidney stones 07/2015  Hyperlipidemia    Hypertension    borderline not on meds    Insomnia secondary to depression with anxiety    Lumbar disc disease    Ovarian cancer (HCC) 2014/2020   met nodule in abd   Pelvic mass in female    Pneumonia    hx of pneumonia as an infant   PONV (postoperative nausea and vomiting)    pain from gas hernia 13-Jul-2016   Shortness of breath    with exertion    Sleep apnea    uses CPAP   Yeast infection     PAST SURGICAL HISTORY: Past Surgical History:  Procedure Laterality Date   ABDOMINAL HYSTERECTOMY     early 30s   APPENDECTOMY     WITH DEBULKING/BSO   CHOLECYSTECTOMY     early 76s   CYSTOSCOPY W/ URETERAL STENT PLACEMENT Left 07/09/2015   DUE TO NEPHROLITHIASIS Procedure: CYSTOSCOPY WITH LEFT RETROGRADE PYELOGRAM/ LEFT URETERAL STENT PLACEMENT;  Surgeon: Morene LELON Salines, MD;  Location: [facility];  Service: Urology;  Laterality: Left;   HERNIA REPAIR     INCISIONAL HERNIA REPAIR N/A 12/25/2015   WITH MESH Procedure:  INCISIONAL HERNIA REPAIR;  Surgeon: Lynda Leos, MD;  Location: [facility];  Service: General;  Laterality: N/A;   INCISIONAL HERNIA REPAIR N/A 10/21/2018   Procedure: LAPAROSCOPIC INCISIONAL HERNIA REPAIR WITH MESH;  Surgeon: Leos Lynda, MD;  [facility]: [facility];  Service: General;  Laterality: N/A;   INSERTION OF MESH N/A  12/25/2015   Procedure: INSERTION OF MESH;  Surgeon: Lynda Leos, MD;  Location: [facility];  Service: General;  Laterality: N/A;   IR REMOVAL TUN ACCESS W/ PORT W/O FL MOD SED  11/10/2022   LAPAROTOMY Bilateral 11/08/2012   Procedure: EXPLORATORY LAPAROTOMY BILATERAL SALPINGO OOPHORECTOMY TUMOR DEBULKING ;  Surgeon: Elenore A. Dodie, MD;  Location: [facility];  Service: Gynecology;  Laterality: Bilateral;  APPENDECTOMY / OMENTECTOMY   LAPAROTOMY N/A 12/25/2015   Procedure: EXPLORATORY LAPAROTOMY;  Surgeon: Lynda Leos, MD;  Location: [facility];  Service: General;  Laterality: N/A;   LYSIS OF ADHESION N/A 12/25/2015   Procedure: LYSIS OF ADHESION;  Surgeon: Lynda Leos, MD;  Location: [facility];  Service: General;  Laterality: N/A;   PARTIAL HYSTERECTOMY  2005-07-13   TRIGGER FINGER RELEASE      FAMILY HISTORY: Family History  Problem Relation Age of Onset   Lung cancer Mother 32   High blood pressure Mother    High Cholesterol Mother    Lung cancer Father 58       2 ppd smoker   Parkinson's disease Father    Kidney disease Other     SOCIAL HISTORY: Social History   Socioeconomic History   Marital status: Widowed    Spouse name: Aida   Number of children: 2   Years of education: Master's   Highest education level: Not on file  Occupational History   Not on file  Tobacco Use   Smoking status: Never   Smokeless tobacco: Never  Vaping Use   Vaping status: Never Used  Substance and Sexual Activity   Alcohol use: No   Drug use: No   Sexual activity: Not on file  Other Topics Concern   Not on file  Social History Narrative   Patient is married Junious). Husband passed away in 07-14-15   Patient has 2 children by birth and 13 children all together.   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   Patient is right-handed.   Patient drinks very little  caffeine.         Social Drivers of Corporate Investment Banker Strain: Not on file  Food Insecurity: No Food Insecurity (01/21/2024)   Hunger Vital  Sign    Worried About Running Out of Food in the Last Year: Never true    Ran Out of Food in the Last Year: Never true  Transportation Needs: No Transportation Needs (01/21/2024)   PRAPARE - Administrator, Civil Service (Medical): No    Lack of Transportation (Non-Medical): No  Physical Activity: Not on file  Stress: Not on file  Social Connections: Not on file  Intimate Partner Violence: Not on file      PHYSICAL EXAM  There were no vitals filed for this visit.    There is no height or weight on file to calculate BMI.  Generalized: Well developed, in no acute distress  Cardiology: normal rate and rhythm, no murmur noted Respiratory: Clear to auscultation bilaterally  Neurological examination  Mentation: Alert oriented to time, place, history taking. Follows all commands speech and language fluent Cranial nerve II-XII: Pupils were equal round reactive to light. Extraocular movements were full, visual field were full  Motor: The motor testing reveals 5 over 5 strength of all 4 extremities. Good symmetric motor tone is noted throughout.  Gait and station: Gait is normal.    DIAGNOSTIC DATA (LABS, IMAGING, TESTING) - I reviewed patient records, labs, notes, testing and imaging myself where available.      No data to display           Lab Results  Component Value Date   WBC 7.2 10/11/2023   HGB 13.2 10/11/2023   HCT 40.5 10/11/2023   MCV 91.4 10/11/2023   PLT 156 10/11/2023      Component Value Date/Time   NA 138 10/11/2023 0733   NA 141 12/02/2016 1219   K 3.5 10/11/2023 0733   K 3.7 12/02/2016 1219   CL 100 10/11/2023 0733   CL 103 01/18/2013 1329   CO2 30 10/11/2023 0733   CO2 27 12/02/2016 1219   GLUCOSE 114 (H) 10/11/2023 0733   GLUCOSE 98 12/02/2016 1219   GLUCOSE 110 (H) 01/18/2013 1329   BUN 15 10/11/2023 0733   BUN 15.3 12/02/2016 1219   CREATININE 0.78 10/11/2023 0733   CREATININE 0.71 05/23/2021 0758   CREATININE 0.7 12/02/2016 1219    CALCIUM  10.8 (H) 10/11/2023 0733   CALCIUM  10.1 12/02/2016 1219   PROT 7.0 10/11/2023 0733   PROT 7.1 12/02/2016 1219   ALBUMIN 4.5 10/11/2023 0733   ALBUMIN 4.2 12/02/2016 1219   AST 25 10/11/2023 0733   AST 25 05/23/2021 0758   AST 21 12/02/2016 1219   ALT 38 10/11/2023 0733   ALT 26 05/23/2021 0758   ALT 27 12/02/2016 1219   ALKPHOS 72 10/11/2023 0733   ALKPHOS 104 12/02/2016 1219   BILITOT 0.4 10/11/2023 0733   BILITOT 0.6 05/23/2021 0758   BILITOT 0.37 12/02/2016 1219   GFRNONAA >60 10/11/2023 0733   GFRNONAA >60 05/23/2021 0758   GFRAA >60 05/07/2020 0833   No results found for: CHOL, HDL, LDLCALC, LDLDIRECT, TRIG, CHOLHDL Lab Results  Component Value Date   HGBA1C 6.0 (H) 10/17/2018   No results found for: VITAMINB12 No results found for: TSH     ASSESSMENT AND PLAN 78 y.o. year old female  has a past medical history of Burning mouth syndrome, Chronic fatigue, Constipation, Diabetes (HCC) (05/09/2018), Diarrhea, Fibromyalgia, GERD (gastroesophageal reflux disease),  History of kidney stones (07/2015), Hyperlipidemia, Hypertension, Insomnia secondary to depression with anxiety, Lumbar disc disease, Ovarian cancer (HCC) (2014/2020), Pelvic mass in female, Pneumonia, PONV (postoperative nausea and vomiting), Shortness of breath, Sleep apnea, and Yeast infection. here with     ICD-10-CM   1. OSA on CPAP  G47.33     2. Excessive daytime sleepiness  G47.19     3. Adjustment insomnia  F51.02        Annalucia continues to do well with CPAP therapy. Compliance data not available for today's visit. No SD card. We have reached out to DME to assist with getting data. She was encouraged to continue using CPAP nightly and for greater than 4 hours each night. We will continue Lunesta  3mg  daily. She has failed Ambien , trazodone  and Belsomra . She will continue modafinil  200mg  daily for daytime sleepiness. ESS 8/24. PDMP appropriate. Refills sent by Dr Chalice 04/14/2023  for 6 months. She will continue to follow up with oncology, NS and PCP. She will call us  with any new concerns.  She will follow-up with Dr Chalice in 6 months, per Dr Dohmeier's last note. I look forward to alternating her care with MD. She verbalizes understanding and agreement with this plan.   No orders of the defined types were placed in this encounter.    No orders of the defined types were placed in this encounter.     I spent 25 minutes with the patient. 50% of this time was spent counseling and educating patient on plan of care and medications.    Greig Forbes, FNP-C 06/12/2024, 4:43 PM [facility] [address] [place], [place] [address] [phone]

## 2024-06-12 NOTE — Patient Instructions (Incomplete)
 Please continue using your CPAP regularly. While your insurance requires that you use CPAP at least 4 hours each night on 70% of the nights, I recommend, that you not skip any nights and use it throughout the night if you can. Getting used to CPAP and staying with the treatment long term does take time and patience and discipline. Untreated obstructive sleep apnea when it is moderate to severe can have an adverse impact on cardiovascular health and raise her risk for heart disease, arrhythmias, hypertension, congestive heart failure, stroke and diabetes. Untreated obstructive sleep apnea causes sleep disruption, nonrestorative sleep, and sleep deprivation. This can have an impact on your day to day functioning and cause daytime sleepiness and impairment of cognitive function, memory loss, mood disturbance, and problems focussing. Using CPAP regularly can improve these symptoms.  We will update supply orders, today. Continue eszopiclone  3mg  at bedtime and modafinil  200mg  daily. Keep a close eye on your blood pressure. Make sure you have the correct dose of amlodipine .   Follow up in 6-8 months with Dr Chalice

## 2024-06-15 ENCOUNTER — Encounter: Payer: Self-pay | Admitting: Family Medicine

## 2024-06-15 ENCOUNTER — Ambulatory Visit (INDEPENDENT_AMBULATORY_CARE_PROVIDER_SITE_OTHER): Admitting: Family Medicine

## 2024-06-15 VITALS — BP 100/54 | HR 71 | Ht 59.0 in | Wt 146.8 lb

## 2024-06-15 DIAGNOSIS — F5102 Adjustment insomnia: Secondary | ICD-10-CM | POA: Diagnosis not present

## 2024-06-15 DIAGNOSIS — G620 Drug-induced polyneuropathy: Secondary | ICD-10-CM | POA: Diagnosis not present

## 2024-06-15 DIAGNOSIS — G4733 Obstructive sleep apnea (adult) (pediatric): Secondary | ICD-10-CM | POA: Diagnosis not present

## 2024-06-15 DIAGNOSIS — G4719 Other hypersomnia: Secondary | ICD-10-CM | POA: Diagnosis not present

## 2024-06-15 MED ORDER — ESZOPICLONE 3 MG PO TABS: ORAL_TABLET | ORAL | 5 refills | Status: AC

## 2024-06-15 NOTE — Progress Notes (Signed)
 [name]

## 2024-06-29 DIAGNOSIS — Z86018 Personal history of other benign neoplasm: Secondary | ICD-10-CM | POA: Diagnosis not present

## 2024-07-09 DIAGNOSIS — E1121 Type 2 diabetes mellitus with diabetic nephropathy: Secondary | ICD-10-CM | POA: Diagnosis not present

## 2024-07-09 DIAGNOSIS — I2510 Atherosclerotic heart disease of native coronary artery without angina pectoris: Secondary | ICD-10-CM | POA: Diagnosis not present

## 2024-07-09 DIAGNOSIS — E1159 Type 2 diabetes mellitus with other circulatory complications: Secondary | ICD-10-CM | POA: Diagnosis not present

## 2024-07-09 DIAGNOSIS — I10 Essential (primary) hypertension: Secondary | ICD-10-CM | POA: Diagnosis not present

## 2024-07-12 ENCOUNTER — Other Ambulatory Visit: Payer: Self-pay | Admitting: Neurology

## 2024-07-13 NOTE — Telephone Encounter (Signed)
 Last refilled by patient on : 04/04/24 Last office visit :  06/15/24 Next office visit :  12/28/24 Per last note - modafinil  200mg  daily

## 2024-07-18 DIAGNOSIS — M25552 Pain in left hip: Secondary | ICD-10-CM | POA: Diagnosis not present

## 2024-07-24 MED ORDER — MODAFINIL 200 MG PO TABS: 200.0000 mg | ORAL_TABLET | Freq: Every day | ORAL | 3 refills | Status: AC

## 2024-07-24 NOTE — Telephone Encounter (Signed)
 Patient said refill for  modafinil  (PROVIGIL ) 200 MG tablet was sent to the wrong pharmacy. Should be sent to  [id number]

## 2024-07-24 NOTE — Addendum Note (Signed)
 Addended by: Nyjah Schwake-JACKSON, Kathalina Ostermann L on: 07/24/2024 01:06 PM   Modules accepted: Orders

## 2024-07-24 NOTE — Addendum Note (Signed)
 Addended by: CHALICE SAUNAS on: 07/24/2024 04:55 PM   Modules accepted: Orders

## 2024-09-12 ENCOUNTER — Telehealth: Payer: Self-pay

## 2024-10-23 ENCOUNTER — Other Ambulatory Visit

## 2024-10-23 ENCOUNTER — Other Ambulatory Visit (HOSPITAL_COMMUNITY)

## 2024-10-30 ENCOUNTER — Ambulatory Visit: Admitting: Psychiatry

## 2024-10-31 ENCOUNTER — Ambulatory Visit: Admitting: Hematology and Oncology

## 2024-12-19 ENCOUNTER — Ambulatory Visit: Admitting: Neurology
